# Patient Record
Sex: Female | Born: 1965
Health system: Southern US, Community
[De-identification: ages and names within clinical notes are randomized; demographics above are authoritative.]

## PROBLEM LIST (undated history)

## (undated) DIAGNOSIS — Z860100 Personal history of colon polyps, unspecified: Secondary | ICD-10-CM

## (undated) DIAGNOSIS — Z923 Personal history of irradiation: Secondary | ICD-10-CM

## (undated) DIAGNOSIS — Z9889 Other specified postprocedural states: Secondary | ICD-10-CM

## (undated) DIAGNOSIS — G894 Chronic pain syndrome: Secondary | ICD-10-CM

## (undated) DIAGNOSIS — K219 Gastro-esophageal reflux disease without esophagitis: Secondary | ICD-10-CM

## (undated) DIAGNOSIS — F329 Major depressive disorder, single episode, unspecified: Secondary | ICD-10-CM

## (undated) DIAGNOSIS — G43909 Migraine, unspecified, not intractable, without status migrainosus: Secondary | ICD-10-CM

## (undated) DIAGNOSIS — I427 Cardiomyopathy due to drug and external agent: Secondary | ICD-10-CM

## (undated) DIAGNOSIS — Z8719 Personal history of other diseases of the digestive system: Secondary | ICD-10-CM

## (undated) DIAGNOSIS — Z8701 Personal history of pneumonia (recurrent): Secondary | ICD-10-CM

## (undated) DIAGNOSIS — Z7901 Long term (current) use of anticoagulants: Secondary | ICD-10-CM

## (undated) DIAGNOSIS — F32A Depression, unspecified: Secondary | ICD-10-CM

## (undated) DIAGNOSIS — Z8042 Family history of malignant neoplasm of prostate: Secondary | ICD-10-CM

## (undated) DIAGNOSIS — Z86718 Personal history of other venous thrombosis and embolism: Secondary | ICD-10-CM

## (undated) DIAGNOSIS — R112 Nausea with vomiting, unspecified: Secondary | ICD-10-CM

## (undated) DIAGNOSIS — F419 Anxiety disorder, unspecified: Secondary | ICD-10-CM

## (undated) DIAGNOSIS — C50919 Malignant neoplasm of unspecified site of unspecified female breast: Secondary | ICD-10-CM

## (undated) HISTORY — PX: REDUCTION MAMMAPLASTY: SUR839

## (undated) HISTORY — DX: Family history of malignant neoplasm of prostate: Z80.42

## (undated) HISTORY — DX: Cardiomyopathy due to drug and external agent: I42.7

---

## 2000-05-11 HISTORY — PX: SHOULDER ARTHROSCOPY WITH ROTATOR CUFF REPAIR: SHX5685

## 2000-11-29 ENCOUNTER — Other Ambulatory Visit: Admission: RE | Admit: 2000-11-29 | Discharge: 2000-11-29 | Payer: Self-pay | Admitting: Obstetrics and Gynecology

## 2000-11-30 ENCOUNTER — Ambulatory Visit (HOSPITAL_COMMUNITY): Admission: RE | Admit: 2000-11-30 | Discharge: 2000-11-30 | Payer: Self-pay | Admitting: *Deleted

## 2000-11-30 ENCOUNTER — Encounter: Payer: Self-pay | Admitting: *Deleted

## 2001-01-28 ENCOUNTER — Ambulatory Visit (HOSPITAL_COMMUNITY): Admission: RE | Admit: 2001-01-28 | Discharge: 2001-01-28 | Payer: Self-pay | Admitting: Pulmonary Disease

## 2001-02-08 ENCOUNTER — Ambulatory Visit (HOSPITAL_COMMUNITY): Admission: RE | Admit: 2001-02-08 | Discharge: 2001-02-08 | Payer: Self-pay | Admitting: Pulmonary Disease

## 2001-02-17 ENCOUNTER — Ambulatory Visit (HOSPITAL_COMMUNITY): Admission: RE | Admit: 2001-02-17 | Discharge: 2001-02-17 | Payer: Self-pay | Admitting: Pulmonary Disease

## 2001-02-28 ENCOUNTER — Ambulatory Visit (HOSPITAL_COMMUNITY): Admission: RE | Admit: 2001-02-28 | Discharge: 2001-02-28 | Payer: Self-pay | Admitting: Pulmonary Disease

## 2001-05-11 HISTORY — PX: CERVICAL FUSION: SHX112

## 2002-03-17 ENCOUNTER — Emergency Department (HOSPITAL_COMMUNITY): Admission: EM | Admit: 2002-03-17 | Discharge: 2002-03-17 | Payer: Self-pay | Admitting: Emergency Medicine

## 2002-03-17 ENCOUNTER — Encounter: Payer: Self-pay | Admitting: Emergency Medicine

## 2002-11-08 ENCOUNTER — Ambulatory Visit (HOSPITAL_COMMUNITY): Admission: RE | Admit: 2002-11-08 | Discharge: 2002-11-08 | Payer: Self-pay | Admitting: Pulmonary Disease

## 2002-11-10 ENCOUNTER — Encounter (HOSPITAL_COMMUNITY): Admission: RE | Admit: 2002-11-10 | Discharge: 2002-12-10 | Payer: Self-pay | Admitting: Pulmonary Disease

## 2002-11-17 ENCOUNTER — Ambulatory Visit (HOSPITAL_COMMUNITY): Admission: RE | Admit: 2002-11-17 | Discharge: 2002-11-17 | Payer: Self-pay | Admitting: General Surgery

## 2002-11-17 HISTORY — PX: LAPAROSCOPIC CHOLECYSTECTOMY: SUR755

## 2003-02-08 ENCOUNTER — Ambulatory Visit (HOSPITAL_COMMUNITY): Admission: RE | Admit: 2003-02-08 | Discharge: 2003-02-08 | Payer: Self-pay | Admitting: Pulmonary Disease

## 2003-02-23 ENCOUNTER — Encounter (HOSPITAL_COMMUNITY): Admission: RE | Admit: 2003-02-23 | Discharge: 2003-03-25 | Payer: Self-pay | Admitting: Neurosurgery

## 2003-05-12 HISTORY — PX: KNEE ARTHROSCOPY: SUR90

## 2003-12-13 ENCOUNTER — Ambulatory Visit (HOSPITAL_COMMUNITY): Admission: RE | Admit: 2003-12-13 | Discharge: 2003-12-13 | Payer: Self-pay | Admitting: Pulmonary Disease

## 2004-11-17 ENCOUNTER — Ambulatory Visit (HOSPITAL_COMMUNITY): Admission: RE | Admit: 2004-11-17 | Discharge: 2004-11-17 | Payer: Self-pay | Admitting: Internal Medicine

## 2005-10-26 ENCOUNTER — Ambulatory Visit (HOSPITAL_COMMUNITY): Admission: RE | Admit: 2005-10-26 | Discharge: 2005-10-26 | Payer: Self-pay | Admitting: Pulmonary Disease

## 2005-12-21 ENCOUNTER — Ambulatory Visit (HOSPITAL_COMMUNITY): Admission: RE | Admit: 2005-12-21 | Discharge: 2005-12-21 | Payer: Self-pay | Admitting: Obstetrics and Gynecology

## 2006-05-11 HISTORY — PX: CATARACT EXTRACTION W/ INTRAOCULAR LENS  IMPLANT, BILATERAL: SHX1307

## 2006-06-22 ENCOUNTER — Ambulatory Visit: Payer: Self-pay | Admitting: Cardiology

## 2006-10-08 ENCOUNTER — Ambulatory Visit (HOSPITAL_COMMUNITY): Admission: RE | Admit: 2006-10-08 | Discharge: 2006-10-08 | Payer: Self-pay | Admitting: Pulmonary Disease

## 2006-10-25 ENCOUNTER — Ambulatory Visit (HOSPITAL_COMMUNITY): Admission: RE | Admit: 2006-10-25 | Discharge: 2006-10-25 | Payer: Self-pay | Admitting: Pulmonary Disease

## 2007-03-07 ENCOUNTER — Ambulatory Visit (HOSPITAL_COMMUNITY): Admission: RE | Admit: 2007-03-07 | Discharge: 2007-03-07 | Payer: Self-pay | Admitting: Obstetrics and Gynecology

## 2007-03-30 ENCOUNTER — Ambulatory Visit (HOSPITAL_COMMUNITY): Admission: RE | Admit: 2007-03-30 | Discharge: 2007-03-30 | Payer: Self-pay | Admitting: Pulmonary Disease

## 2007-04-13 ENCOUNTER — Encounter (INDEPENDENT_AMBULATORY_CARE_PROVIDER_SITE_OTHER): Payer: Self-pay | Admitting: General Surgery

## 2007-04-13 ENCOUNTER — Inpatient Hospital Stay (HOSPITAL_COMMUNITY): Admission: RE | Admit: 2007-04-13 | Discharge: 2007-04-18 | Payer: Self-pay | Admitting: General Surgery

## 2007-04-13 HISTORY — PX: OTHER SURGICAL HISTORY: SHX169

## 2007-07-19 ENCOUNTER — Ambulatory Visit: Payer: Self-pay | Admitting: *Deleted

## 2007-07-19 ENCOUNTER — Inpatient Hospital Stay (HOSPITAL_COMMUNITY): Admission: AD | Admit: 2007-07-19 | Discharge: 2007-07-21 | Payer: Self-pay | Admitting: *Deleted

## 2007-07-27 ENCOUNTER — Other Ambulatory Visit: Admission: RE | Admit: 2007-07-27 | Discharge: 2007-07-27 | Payer: Self-pay | Admitting: Obstetrics and Gynecology

## 2008-03-21 ENCOUNTER — Ambulatory Visit (HOSPITAL_COMMUNITY): Admission: RE | Admit: 2008-03-21 | Discharge: 2008-03-21 | Payer: Self-pay | Admitting: Obstetrics and Gynecology

## 2008-08-20 ENCOUNTER — Other Ambulatory Visit: Admission: RE | Admit: 2008-08-20 | Discharge: 2008-08-20 | Payer: Self-pay | Admitting: Obstetrics and Gynecology

## 2008-09-15 ENCOUNTER — Encounter: Admission: RE | Admit: 2008-09-15 | Discharge: 2008-09-15 | Payer: Self-pay | Admitting: Specialist

## 2009-03-22 ENCOUNTER — Ambulatory Visit (HOSPITAL_COMMUNITY): Admission: RE | Admit: 2009-03-22 | Discharge: 2009-03-22 | Payer: Self-pay | Admitting: Obstetrics and Gynecology

## 2009-08-14 ENCOUNTER — Ambulatory Visit (HOSPITAL_COMMUNITY): Admission: RE | Admit: 2009-08-14 | Discharge: 2009-08-14 | Payer: Self-pay | Admitting: Pulmonary Disease

## 2010-03-24 ENCOUNTER — Ambulatory Visit (HOSPITAL_COMMUNITY): Admission: RE | Admit: 2010-03-24 | Discharge: 2010-03-24 | Payer: Self-pay | Admitting: Gynecology

## 2010-04-11 ENCOUNTER — Ambulatory Visit (HOSPITAL_COMMUNITY)
Admission: RE | Admit: 2010-04-11 | Discharge: 2010-04-11 | Payer: Self-pay | Source: Home / Self Care | Admitting: Obstetrics and Gynecology

## 2010-04-11 HISTORY — PX: OTHER SURGICAL HISTORY: SHX169

## 2010-05-31 ENCOUNTER — Encounter: Payer: Self-pay | Admitting: Obstetrics and Gynecology

## 2010-06-01 ENCOUNTER — Encounter: Payer: Self-pay | Admitting: Obstetrics and Gynecology

## 2010-07-22 LAB — COMPREHENSIVE METABOLIC PANEL
ALT: 36 U/L — ABNORMAL HIGH (ref 0–35)
Albumin: 3.7 g/dL (ref 3.5–5.2)
Alkaline Phosphatase: 67 U/L (ref 39–117)
Creatinine, Ser: 0.71 mg/dL (ref 0.4–1.2)
GFR calc non Af Amer: >60 mL/min (ref >60)
Glucose, Bld: 92 mg/dL (ref 70–99)
Sodium: 135 mEq/L (ref 135–145)
Total Bilirubin: 0.2 mg/dL — ABNORMAL LOW (ref 0.3–1.2)

## 2010-07-22 LAB — CBC
HCT: 36.5 % (ref 36.0–46.0)
Hemoglobin: 12.3 g/dL (ref 12.0–15.0)
MCH: 30.8 pg (ref 26.0–34.0)
MCHC: 33.7 g/dL (ref 30.0–36.0)
MCV: 91.5 fL (ref 78.0–100.0)
RBC: 3.99 MIL/uL (ref 3.87–5.11)

## 2010-09-23 NOTE — Discharge Summary (Signed)
NAMEAMRITA, [name]                ACCOUNT NO.:  [id number]   MEDICAL RECORD NO.:  [id number]          PATIENT TYPE:  INP   LOCATION:  A313                          FACILITY:  [facility]   PHYSICIAN:  Dalia Heading, M.D.  DATE OF BIRTH:  1965-10-05   DATE OF ADMISSION:  04/13/2007  DATE OF DISCHARGE:  12/08/2008LH                               DISCHARGE SUMMARY   HOSPITAL COURSE SUMMARY:  The patient is a 45 year old white female who  presented to the operating room for a diagnostic laparoscopy.  This was  due to the fact that she had a 3-week history of nonspecific right lower  quadrant abdominal pain of unknown etiology.  At the time of surgery,  she was found to have a soft tissue mass in the small bowel.  She  ultimately underwent a laparoscopic partial small-bowel resection as  well as appendectomy.  She tolerated both procedures well.  Postoperative course was, for the most part, unremarkable.  Her diet was  advanced without difficulty once her bowel function returned.   Final pathology revealed a lipoma of the small bowel.  It is suspected  that she was having intermittent episodes of intussusception due to the  lipomas mass in the small bowel.  She is being discharged home on  April 18, 2007 in good improving condition.   DISCHARGE INSTRUCTIONS:  The patient is to follow up with Dr. Franky Macho on April 21, 2007.   DISCHARGE MEDICATIONS INCLUDE:  1. Topamax 2 mg p.o. nightly.  2. Relpax 40 mg p.o. p.r.n.  3. Birth control pills.  4. Hydrocodone p.r.n. pain.   PRINCIPAL DIAGNOSIS:  1. Lipoma, small bowel.  2. History of migraine headaches.   PRINCIPAL PROCEDURE:  On April 13, 2007  1. Laparoscopic partial small-bowel resection.  2. Laparoscopic appendectomy.      Dalia Heading, M.D.  Electronically Signed     MAJ/MEDQ  D:  04/18/2007  T:  04/18/2007  Job:  [id number]   cc:   Ramon Dredge L. Juanetta Gosling, M.D.  Fax: [phone]   Lazaro Arms, M.D.  Fax:  [phone]

## 2010-09-23 NOTE — Op Note (Signed)
[name], [name]                ACCOUNT NO.:  [id number]   MEDICAL RECORD NO.:  [id number]          PATIENT TYPE:  INP   LOCATION:  A313                          FACILITY:  [facility]   PHYSICIAN:  Dalia Heading, M.D.  DATE OF BIRTH:  13-Mar-1966   DATE OF PROCEDURE:  04/13/2007  DATE OF DISCHARGE:                               OPERATIVE REPORT   PREOPERATIVE DIAGNOSIS:  Abdominal pain of unknown etiology.   POSTOPERATIVE DIAGNOSIS:  Abdominal pain of unknown etiology, small  bowel neoplasm.   PROCEDURE:  Diagnostic laparoscopy, laparoscopic partial small-bowel  resection, laparoscopic appendectomy.   SURGEON:  Dr. Franky Macho.   ASSISTANT:  Dr. Tilford Pillar.   ANESTHESIA:  General endotracheal.   INDICATIONS:  The patient is a 45 year old white female who presents  with a 3-week history of worsening lower abdominal pain of unknown  etiology.  She has had multiple CAT scans of the pelvis which had been  unremarkable except for small ovarian cysts.  The patient now comes to  the operating for diagnostic laparoscopy.  Risks and benefits of the  procedure including bleeding, infection, pain, and the possibility of an  open procedure were fully explained to the patient, who gave informed  consent.  The patient was told she was going to have an appendectomy  during the procedure.   PROCEDURE NOTE:  The patient was placed in the supine position.  After  induction of general endotracheal anesthesia, the abdomen was prepped  and draped in the usual sterile technique with Betadine.  Surgical site  confirmation was performed.   A supraumbilical incision was made down to the fascia.  A Veress needle  was introduced into the abdominal cavity and confirmation of placement  was done using the saline drop test.  The abdomen was then insufflated  to 16 mmHg pressure.  An 11 mm trocar was introduced into the abdominal  cavity under direct visualization without difficulty.  The patient was  placed in deeper Trendelenburg position.  Additional 12-mm port trocar  was placed in the suprapubic region and a 5-mm trocar was placed left  lower quadrant region.  The uterus and ovaries were inspected and noted  to normal limits.  The appendix was visualized and noted to be within  normal limits.  The terminal ileum and proximal small bowel was then  inspected.  At approximately the area you would find a Meckel's  diverticulum, a soft solid mass was noted to be in this region.  It  appeared to be partially obstructing the small bowel.  It was submucosal  in nature and yellow in appearance.  It was consistent with either a  large lipoma of the wall of the small bowel at the area of the  diverticulum or a neoplastic process.  It was elected to proceed with  resection of this area small of bowel as well as a appendectomy.  The  appendiceal mesentery was divided using the harmonic scalpel.  A  vascular Endo-GIA was placed across the base of the appendix and fired.  The appendix was removed using EndoCatch bag without difficulty.  Next,  a Gel Port was then inserted through an extended Pfannenstiel incision.  This allowed manipulation of the small intestine as well as further  exploration.  Liver was within normal limits.  No abnormal lesions were  noted.  The gallbladder was absent.  The rest of the proximal small  bowel was within normal limits.  No abnormal lesions were noted.  There  were no peritoneal implants present.  The sigmoid colon was within  normal limits.  No abnormal lesions were noted.  The small bowel was  then exteriorized through the Pfannenstiel incision.  The GIA stapler  was placed proximally and distally across the small bowel around the  neoplasm without difficulty.  The mesentery was divided using the  LigaSure.  The specimen was then removed and sent to pathology for  further examination.  A side-to-side enteroenterotomy was then performed  using a GIA 60 stapler.   The enterotomy was closed using a TA-60  stapler.  The staple line was bolstered using 3-0 silk Lambert sutures.  The mesenteric defect was closed using a 2-0 chromic gut running suture.  The bowel was then returned into the abdominal cavity and the abdomen  insufflated.  No abnormal bleeding was noted at end of the procedure.  The anastomosis was inspected and noted to be patent.  All fluid and air  were then evacuated from the abdominal cavity prior to removal of the  trocars and the Gel Port.   All wounds irrigated with normal saline.  All wounds were injected with  0.5 cm Sensorcaine.  The supraumbilical fascia was reapproximated using  a 0 Vicryl interrupted suture.  This Pfannenstiel incision was closed in  layers.  The peritoneal layer was closed using a 0 chromic gut running  suture.  The fascia was reapproximated using 0 Vicryl interrupted  sutures.  The subcutaneous layer was closed using 2-0 Vicryl interrupted  sutures.  All skin incisions were then closed using staples.  Betadine  ointment and dry sterile dressings were applied.   All tape and needle counts correct at the end of the procedure.  The  patient was extubated in the operating room and went back to recovery  room awake in stable condition.   COMPLICATIONS:  None.   SPECIMEN:  Appendix, the partial small-bowel.   BLOOD LOSS:  Minimal.      Dalia Heading, M.D.  Electronically Signed     MAJ/MEDQ  D:  04/13/2007  T:  04/13/2007  Job:  [id number]   cc:   Lazaro Arms, M.D.  Fax: [phone]   Oneal Deputy. Juanetta Gosling, M.D.  Fax: [phone]

## 2010-09-23 NOTE — H&P (Signed)
[name], [name]                ACCOUNT NO.:  [id number]   MEDICAL RECORD NO.:  [id number]          PATIENT TYPE:  INP   LOCATION:  A313                          FACILITY:  [facility]   PHYSICIAN:  Dalia Heading, M.D.  DATE OF BIRTH:  07-16-1965   DATE OF ADMISSION:  04/13/2007  DATE OF DISCHARGE:  LH                              HISTORY & PHYSICAL   CHIEF COMPLAINT:  Abdominal pain of unknown etiology.   HISTORY OF PRESENT ILLNESS:  The patient is a 45 year old white female  who was referred for evaluation and treatment of right lower quadrant  abdominal pain.  It has been present for 3 weeks and is not going away.  She has had CT scans x2 which have been remarkable only for ovarian  cysts.  Her appetite is decreased.  No fever or chills have been noted.   PAST MEDICAL HISTORY:  Migraine headaches.   PAST SURGICAL HISTORY:  1. Cholecystectomy.  2. Neck fusion.  3. Right shoulder surgery.  4. Right knee surgery.   CURRENT MEDICATIONS:  1. Topamax.  2. Relpax.  3. Birth control pills.   ALLERGIES:  No known drug allergies.   REVIEW OF SYSTEMS:  The patient denies drinking or smoking.  She denies  any other cardiopulmonary difficulties or bleeding disorders.   PHYSICAL EXAMINATION:  GENERAL:  The patient is a well-developed, well-  nourished, white female in no acute distress.  HEENT:  No scleral icterus.  LUNGS:  Clear to auscultation with equal breath sounds bilaterally.  HEART:  Regular rate and rhythm without S3, S4 or murmurs.  ABDOMEN:  Soft and nondistended.  She is tender in the right lower  quadrant to palpation.  No hepatosplenomegaly, masses or hernias are  identified.   IMPRESSION:  Abdominal pain of unknown etiology.   PLAN:  The patient is scheduled for a diagnostic laparoscopy with  appendectomy on April 13, 2007.  The risks and benefits of the  procedure including bleeding, infection, recurrence of the pain and the  possibility of an open procedure  were fully explained to the patient who  gave informed consent.      Dalia Heading, M.D.  Electronically Signed     MAJ/MEDQ  D:  04/12/2007  T:  04/13/2007  Job:  [id number]   cc:   Ramon Dredge L. Juanetta Gosling, M.D.  Fax: [phone]   Lazaro Arms, M.D.  Fax: [phone]
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
## 2010-09-26 NOTE — Op Note (Signed)
[name], [name]                          ACCOUNT NO.:  [id number]   MEDICAL RECORD NO.:  [id number]                   PATIENT TYPE:  AMB   LOCATION:  DAY                                  FACILITY:  [facility]   PHYSICIAN:  Dalia Heading, M.D.               DATE OF BIRTH:  Jul 13, 1965   DATE OF PROCEDURE:  11/17/2002  DATE OF DISCHARGE:                                 OPERATIVE REPORT   PREOPERATIVE DIAGNOSIS:  Chronic cholecystitis.   POSTOPERATIVE DIAGNOSIS:  Chronic cholecystitis.   PROCEDURE:  Laparoscopic cholecystectomy   SURGEON:  Dalia Heading, M.D.   ANESTHESIA:  General endotracheal   INDICATIONS:  The patient is a 45 year old white female who presents with  biliary colic secondary to chronic cholecystitis.  The risks and benefits of  the procedure including bleeding, infection, hepatobiliary injury, and the  possibility of an open procedure were fully explained to the patient, who  gave informed consent.   DESCRIPTION OF PROCEDURE:  The patient was placed in the supine position.  After induction of general endotracheal anesthesia, the abdomen was prepped  and draped using the usual sterile technique with Betadine.  Surgical site  confirmation was performed.   A supraumbilical incision was made down to the fascia.  A Veress needle was  introduced into the abdominal cavity and confirmation of placement was done  using the saline drop test.  The abdomen was then insufflated to 16 mmHg  pressure.  An 11-mm trocar was introduced into the abdominal cavity under  direct visualization without difficulty.  The patient was placed in reverse  Trendelenburg position and an additional 11-mm trocar was placed in the  epigastric region and 5-mm trocars were placed in the right upper quadrant  and right flank regions. The liver was inspected and noted to be within  normal limits.  The gallbladder was retracted superiorly and laterally.  The  dissection was begun around the  infundibulum of the gallbladder.  The cystic  duct was first identified.  Its junction to the infundibulum fully  identified.  Endoclips were placed proximally and distally on the cystic  duct; and the cystic duct was divided.  This was likewise done on the cystic  artery.   The gallbladder was then freed away from the gallbladder fossa using Bovie  electrocautery.  The gallbladder was delivered through the epigastric trocar  site without difficulty.  Of note, was the fact that several Fitz-Hugh-  Curtis adhesions were noted between the liver and the anterior abdominal  wall.  These were lysed sharply without difficulty.  The gallbladder fossa  was inspected and no abnormal bleeding or bile leakage was noted.  Surgicel  was placed in the gallbladder fossa,. All fluid and air were then evacuated  from the abdominal cavity prior to removal of the trocars.   All wounds were irrigated with normal saline.  All wounds were injected with  0.5% Sensorcaine.  The supraumbilical fascia was reapproximated using an #0  Vicryl interrupted suture. All skin incisions were closed using staples.  Betadine ointment and dry sterile dressings were applied.   All tape and needle counts correct at the end of the procedure.  The patient  was extubated in the operating room and went back to recovery room in awake  and stable condition.   COMPLICATIONS:  None.   SPECIMEN:  Gallbladder.   BLOOD LOSS:  Minimal.                                               Dalia Heading, M.D.    MAJ/MEDQ  D:  11/17/2002  T:  11/18/2002  Job:  [id number]   cc:   Ramon Dredge L. Juanetta Gosling, M.D.  [address]  [place]  [place] [address]  Fax: [phone]

## 2010-09-26 NOTE — Consult Note (Signed)
NAMEMarland Kitchen  Allison, [name]                          ACCOUNT NO.:  [id number]   MEDICAL RECORD NO.:  [id number]                   PATIENT TYPE:  EMS   LOCATION:  MAJO                                 FACILITY:  [facility]   PHYSICIAN:  Lesleigh Noe, M.D.            DATE OF BIRTH:  07/15/1965   DATE OF CONSULTATION:  03/17/2002  DATE OF DISCHARGE:  03/17/2002                                   CONSULTATION   REASON FOR CONSULTATION:  The patient is a 45 year old and has no prior  history of cardiovascular disease.  She was referred to the emergency room  by Dr. Juanetta [name] this morning after she called with complaints of pressure in  her chest.  In the emergency room here at [facility] she gives a one week  history of near continuous substernal tightness that is not aggravated by  physical activity, change in position, eating, or other activities.  This  morning when she was combing her hair with the background of chest  tightness, there was some numbness and weakness in the left arm, prompting  her to become concerned that this might be related to her heart.  She has  had some exertional dyspnea.  This has been present for several weeks, and  she feels may be related to physical inactivity.  She denies palpitations.  She denies orthopnea, PND, and has not had syncope.   PAST MEDICAL HISTORY:  1. History of right rotator cuff surgery in 5/03.  2. Tonsillectomy.  3. History of cluster headaches.   ALLERGIES:  No known drug allergies.   MEDICATIONS:  1. Topamax 25 mg A.D.  2. Birth control pills one q.d.  3. Vicodin p.r.n. for shoulder discomfort.   HABITS:  Does not smoke.  She drinks alcohol socially, but rarely.   FAMILY HISTORY:  Father has a history of cerebral aneurysm repair.  He is 47  years of age and doing well at this time.  Mother is 36, alive and well.  A  _____-year-old sister alive and well.   REVIEW OF SYMPTOMS:  She denies stress and depression.  She has some  exertional dyspnea.  She had previously exercised, but has not done this  recently.  She feels that she is deconditioned.   PHYSICAL EXAMINATION:  GENERAL:  The patient is having continuous sensation  of a fullness in her chest.  It makes her feel that she wants to take a deep  breath, and also that if she could belch she might feel better.  SKIN:  Warm and dry.  Clear, no cyanosis.  VITAL SIGNS:  Heart rate is 80, blood pressure is 140/60, respirations are  16 and unlabored.  HEENT:  No xanthalasthma, no jaundice.  Extraocular movements are full.  NECK:  No JVD, no carotid bruits are heard.  The thyroid is not palpable.  There is no adenopathy.  LUNGS:  Clear to auscultation and  percussion.  CARDIAC:  No click, no rub, no murmur, no gallop.  PMI is not palpable  because of breasts enlargement.  ABDOMEN:  Soft.  Live and spleen not palpable.  Bowel sounds are normal.  EXTREMITIES:  No edema.  Pulses are 2+ and symmetric in upper and lower  extremities.  NEUROLOGIC:  The patient is awake, alert and oriented x3.   LABORATORY DATA:  EKG reveals normal sinus rhythm and basically a normal  tracing.  This EKG was done several hours after the patient began having  chest discomfort today.  The discomfort today started around 7 a.m.  This  EKG was done at 10:15.  The CK-MB is 52/1.5, troponin-I is 0.02.  BUN and  creatinine are normal.  Hemoglobin is normal at 15.  Chest x-ray reveals  normal heart size, no acute abnormality noted.   ASSESSMENT:  Continuous chest discomfort, likely representing  gastroesophageal reflux.  This could be an atypical presentation for  ischemic heart disease, but would be highly unusual to have near continuous  discomfort for a week with a normal EKG, normal enzymes, and no exertional  component.  There are no significant risk factors in this 45 year old  female.   PLAN:  1. Nexium 40 mg one hour before meals.  2. Will have an outpatient exercise treadmill test  to rule out atypical     presentation of ischemic heart disease.                                                 Lesleigh Noe, M.D.    HWS/MEDQ  D:  03/17/2002  T:  03/19/2002  Job:  [id number]   cc:   Ramon Dredge L. [name], M.D.  [address]  [place]  [place] [address]  Fax: [phone]

## 2010-09-26 NOTE — H&P (Signed)
   [name], [name]                          ACCOUNT NO.:  [id number]   MEDICAL RECORD NO.:  [id number]                   PATIENT TYPE:  AMB   LOCATION:  DAY                                  FACILITY:  [facility]   PHYSICIAN:  Dalia Heading, M.D.               DATE OF BIRTH:  03-13-1966   DATE OF ADMISSION:  11/17/2002  DATE OF DISCHARGE:                                HISTORY & PHYSICAL   CHIEF COMPLAINT:  Chronic cholecystectomy.   HISTORY OF PRESENT ILLNESS:  The patient is a 45 year old white female who  is referred for evaluation and treatment of biliary colic secondary to  chronic cholecystitis.  She has been having right upper quadrant abdominal  pain with radiation to the right flank and back, nausea, postprandial  symptoms, fatty food intolerance, indigestion, and bloating for over a year.  No fever, chills, or jaundice have been noted.  The symptoms seem to be  worsening.   PAST MEDICAL HISTORY:  Includes reflux.   PAST SURGICAL HISTORY:  Right rotator cuff repair.   CURRENT MEDICATIONS:  Birth control pills, Wellbutrin, Topamax.   ALLERGIES:  No known drug allergies.   REVIEW OF SYSTEMS:  Noncontributory.   PHYSICAL EXAMINATION:  GENERAL:  The patient is a well-developed, well-  nourished white female in no acute distress.  VITAL SIGNS:  She is afebrile and vital signs are stable.  HEENT:  Reveals no scleral icterus.  LUNGS:  Clear to auscultation with equal breath sounds bilaterally.  HEART:  Reveals a regular rate and rhythm without S3, S4, or murmurs.  ABDOMEN:  Soft and nondistended.  She is tender in the right upper quadrant  to palpation.  No hepatosplenomegaly, masses, or hernias are identified.   Ultrasound of the gallbladder is negative.  Hepatobiliary scan reveals  chronic cholecystitis with a low gallbladder ejection fraction.   IMPRESSION:  Chronic cholecystitis.    PLAN:  The patient is scheduled for laparoscopic cholecystectomy on November 17, 2002.   The risks and benefits of the procedure including bleeding,  infection, hepatobiliary injury, the possibility of an open procedure were  fully explained to the patient, who gave informed consent.                                               Dalia Heading, M.D.    MAJ/MEDQ  D:  11/16/2002  T:  11/16/2002  Job:  [id number]   cc:   Ramon Dredge L. Juanetta Gosling, M.D.  [address]  [place]  [place] [address]  Fax: [phone]
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
## 2010-10-07 ENCOUNTER — Other Ambulatory Visit: Payer: Self-pay | Admitting: Obstetrics and Gynecology

## 2010-10-07 ENCOUNTER — Encounter (HOSPITAL_COMMUNITY): Payer: 59

## 2010-10-07 LAB — SURGICAL PCR SCREEN
MRSA, PCR: NEGATIVE
Staphylococcus aureus: NEGATIVE

## 2010-10-07 LAB — CBC
HCT: 41.1 % (ref 36.0–46.0)
MCHC: 32.1 g/dL (ref 30.0–36.0)
MCV: 93.6 fL (ref 78.0–100.0)

## 2010-10-09 NOTE — H&P (Addendum)
  [name], [name]                ACCOUNT NO.:  [id number]  MEDICAL RECORD NO.:  [id number]  LOCATION:  SDC                           FACILITY:  [facility]  PHYSICIAN:  Guy Sandifer. Henderson Cloud, M.D. DATE OF BIRTH:  16-Jun-1965  DATE OF ADMISSION:  10/07/2010 DATE OF DISCHARGE:                             HISTORY & PHYSICAL   CHIEF COMPLAINT:  Heavy painful menses.  HISTORY OF PRESENT ILLNESS:  This patient is a 45 year old married white female G2, P2, husband status post vasectomy who is status post hysteroscopy with resection of benign endometrial polyps and NovaSure endometrial ablation in December 2011.  Since then she is continued to have sometimes heavy painful menses.  After discussion of options, she is being admitted for laparoscopically-assisted vaginal hysterectomy.  PAST MEDICAL HISTORY: 1. Headache. 2. Insomnia. 3. Depression.  PAST SURGICAL HISTORY:  Right shoulder rotator cuff repair, neck fusion, cholecystectomy, right knee surgery, appendectomy and removal of benign tumor from intestine 2008 and hysteroscopy D&C, NovaSure endometrial ablation as above.  MEDICATIONS: 1. Zoloft 200 mg daily. 2. Topamax 150 mg daily. 3. Ambien 10 mg p.r.n. 4. Relpax 40 mg p.r.n.  ALLERGIES:  No known drug allergies.  REVIEW OF SYSTEMS:  NEURO:  Headache as above.  CARDIAC:  Denies chest pain.  PULMONARY:  Denies shortness of breath.  FAMILY HISTORY:  Positive for heart disease and diabetes.  PHYSICAL EXAMINATION:  VITAL SIGNS:  Height 5 feet 8 inches, weight 228 pounds, blood pressure 110/76. LUNGS:  Clear to auscultation. HEART:  Regular rate and rhythm. ABDOMEN:  Soft, nontender without masses. PELVIC:  Vagina and cervix without lesion.  Uterus is 8 weeks in size, mobile, nontender.  Adnexa nontender without masses.  EXTREMITIES: Grossly within normal limits. NEUROLOGIC:  Grossly within normal limits.  ASSESSMENT:  Dysmenorrhea, menorrhagia.  PLAN:  Laparoscopically-assisted  vaginal hysterectomy.     Guy Sandifer Henderson Cloud, M.D.     JET/MEDQ  D:  10/08/2010  T:  10/09/2010  Job:  [id number]  Electronically Signed by Harold Hedge M.D. on 11/07/2010 09:12:16 AM

## 2010-10-13 ENCOUNTER — Ambulatory Visit (HOSPITAL_COMMUNITY)
Admission: RE | Admit: 2010-10-13 | Discharge: 2010-10-14 | Disposition: A | Discharge status: Home / Self Care | Payer: 59 | Source: Ambulatory Visit | Attending: Obstetrics and Gynecology | Admitting: Obstetrics and Gynecology

## 2010-10-13 ENCOUNTER — Other Ambulatory Visit: Payer: Self-pay | Admitting: Obstetrics and Gynecology

## 2010-10-13 DIAGNOSIS — N838 Other noninflammatory disorders of ovary, fallopian tube and broad ligament: Secondary | ICD-10-CM | POA: Insufficient documentation

## 2010-10-13 DIAGNOSIS — N83209 Unspecified ovarian cyst, unspecified side: Secondary | ICD-10-CM | POA: Insufficient documentation

## 2010-10-13 DIAGNOSIS — Z01812 Encounter for preprocedural laboratory examination: Secondary | ICD-10-CM | POA: Insufficient documentation

## 2010-10-13 DIAGNOSIS — N920 Excessive and frequent menstruation with regular cycle: Secondary | ICD-10-CM | POA: Insufficient documentation

## 2010-10-13 DIAGNOSIS — N946 Dysmenorrhea, unspecified: Secondary | ICD-10-CM | POA: Insufficient documentation

## 2010-10-13 DIAGNOSIS — Z01818 Encounter for other preprocedural examination: Secondary | ICD-10-CM | POA: Insufficient documentation

## 2010-10-13 HISTORY — PX: LAPAROSCOPIC ASSISTED VAGINAL HYSTERECTOMY: SHX5398

## 2010-10-14 LAB — CBC
HCT: 35.5 % — ABNORMAL LOW (ref 36.0–46.0)
Hemoglobin: 11.1 g/dL — ABNORMAL LOW (ref 12.0–15.0)
MCH: 30.4 pg (ref 26.0–34.0)
Platelets: 206 10*3/uL (ref 150–400)
RDW: 15.9 % — ABNORMAL HIGH (ref 11.5–15.5)

## 2010-11-07 NOTE — Op Note (Signed)
NAMEANNISTON, [name]                ACCOUNT NO.:  [id number]  MEDICAL RECORD NO.:  [id number]  LOCATION:  9306                          FACILITY:  [facility]  PHYSICIAN:  Allison Sandifer. Henderson [name], M.D. DATE OF BIRTH:  1965-10-13  DATE OF PROCEDURE:  10/13/2010 DATE OF DISCHARGE:                              OPERATIVE REPORT   PREOPERATIVE DIAGNOSES: 1. Dysmenorrhea. 2. Menorrhagia.  POSTOPERATIVE DIAGNOSES: 1. Dysmenorrhea. 2. Menorrhagia. 3. Left paratubal cyst. 4. Right ovarian cyst.  PROCEDURES: 1. Laparoscopically-assisted vaginal hysterectomy. 2. Biopsy of left fallopian tube. 3. Aspiration of right ovarian cyst.  SURGEON:  Allison Sandifer. Henderson Cloud, MD  ASSISTANT:  Freddy Finner, MD  ANESTHESIA:  General with endotracheal intubation.  SPECIMENS: 1. Uterus. 2. Biopsy of the left fallopian tube. 3. Aspirate of right ovarian cyst, all to Pathology.  ESTIMATED BLOOD LOSS:  150 mL.  INDICATIONS AND CONSENT:  This patient is a 45 year old married white female G2, P2, husband is status post vasectomy with heavy and painful bleeding.  Details are dictated in the history and physical. Laparoscopically-assisted vaginal hysterectomy, removal of tube and ovary if distinctly abnormal had been discussed preoperatively. Potential risks and complications have been discussed preoperatively including but not limited to infection, organ damage, bleeding requiring transfusion of blood products with HIV and hepatitis acquisition, DVT, PE, pneumonia, fistula formation, laparotomy, pelvic pain, and painful intercourse.  All questions have been answered and consent was signed on the chart.  FINDINGS:  There are several adhesions of the dome on the right side of the liver to the diaphragm.  There are band of omental adhesions to the anterior abdominal wall in the center.  The uterus is 8 weeks in size. Anterior and posterior cul-de-sacs are clean.  The right ovary has a 2-3 cm smooth translucent cyst  and left ovary appears normal and the left fallopian tube has a 2-cm cyst arising from its distal portion.  DESCRIPTION OF PROCEDURE:  The patient was taken to the operating room where she was identified, placed in the dorsal supine position and general anesthesia was induced via endotracheal intubation.  Time-out was undertaken.  She was prepped abdominally and vaginally.  Bladder straight catheterized.  Hulka tenaculum was placed in the uterus as a manipulator and she was draped in a sterile fashion.  The infraumbilical and suprapubic areas were injected in the midline with approximately 5 mL of 0.5% plain Marcaine.  A small infraumbilical incision was made. Disposable Veress needle was placed on the first attempt without difficulty.  Good syringe and drop test were noted.  A 2 liters of gas were insufflated under low pressure with good tympany in the right upper quadrant.  Veress needle was removed.  A 10/11 Xcel bladeless disposable trocar sleeve was placed using direct visualization with the diagnostic laparoscope.  After placement, the operative scope was used.  After careful inspection reveals only the omentum to be involved in the adhesions, the EnSeal bipolar cautery cutting instrument was used to take down the middle adhesions to the anterior abdominal wall to a point of insertion.  Good hemostasis was noted.  A small suprapubic incision was made in the midline and a 5-mm Xcel bladeless disposable  trocar sleeve was placed under direct visualization without difficulty.  The above findings were noted.  The left paratubal cyst was removed with the EnSeal product and good hemostasis was maintained.  The right ovarian cyst was aspirated for several drops of clear fluid which was sent for cytology.  Hemostasis was maintained with the EnSeal product.  The EnSeal was then used to take down the proximal ligaments down to the level of the vesicouterine peritoneum bilaterally.  The  vesicouterine peritoneum was then taken down cephalad laterally with the same instrument.  After noting hemostasis, suprapubic trocar sleeve was removed.  Instruments were removed.  Attention was turned to the vagina. Posterior cul-de-sac was sharply entered and the cervix was circumscribed with the unipolar cautery.  Mucosa was advanced sharply and bluntly.  Then using the LigaSure bipolar cautery instrument, the uterosacral ligaments were taken down followed by the bladder pillars, cardinal ligaments, and uterine vessels bilaterally.  Fundus was delivered posteriorly.  Pedicles were taken down.  The specimens delivered.  All suture will be 0 Monocryl unless otherwise designated. Uterosacral ligaments were plicated the vaginal cuff bilaterally.  A 3 and 9 o'clock suture was also used to obtain complete hemostasis. Uterosacral ligaments were then plicated in the midline with a third suture.  Cuff was closed with figure-of-eights.  Foley catheter was placed in the bladder and clear urine was noted.  Pneumoperitoneum was then reintroduced.  The suprapubic trocar sleeve was reintroduced under direct visualization and irrigation was carried out.  Minor oozing at peritoneal edges was controlled with bipolar cautery.  The remaining 25 mL of 1/2% plain Marcaine was placed in the peritoneal cavity. Suprapubic trocar sleeve was removed.  Pneumoperitoneum was reduced. The umbilical trocar sleeve was removed.  Both incisions were closed with Dermabond.  All counts were correct.  The patient was awakened, taken to the recovery room in stable condition.     [name], M.D.     JET/MEDQ  D:  10/13/2010  T:  10/13/2010  Job:  [id number]  Electronically Signed by Harold Hedge M.D. on 11/07/2010 09:12:09 AM

## 2010-11-07 NOTE — Discharge Summary (Signed)
  [name], [name]                ACCOUNT NO.:  [id number]  MEDICAL RECORD NO.:  [id number]  LOCATION:  9306                          FACILITY:  [facility]  PHYSICIAN:  Guy Sandifer. Henderson Cloud, M.D. DATE OF BIRTH:  January 16, 1966  DATE OF ADMISSION:  10/13/2010 DATE OF DISCHARGE:  10/14/2010                              DISCHARGE SUMMARY   ADMITTING DIAGNOSES:  Dysmenorrhea and menorrhagia.  DISCHARGE DIAGNOSES:  Dysmenorrhea, menorrhagia, left paratubal cyst and right ovarian cyst.  PROCEDURE:  On October 13, 2010, is laparoscopically-assisted vaginal hysterectomy, biopsy of left fallopian tube and aspiration of right ovarian cyst.  REASON FOR ADMISSION:  This patient is a 45 year old married white female G2, P2, husband status post vasectomy with increasingly symptomatic heavy painful menses.  Details are dictated in the history and physical.  She is admitted for surgical management.  HOSPITAL COURSE:  The patient was admitted to the hospital, undergoes the above procedure.  Estimated blood loss was 150 mL.  On the evening of surgery, she had good pain relief, tolerating regular diet and ambulating.  Vital signs are stable.  She is afebrile with clear urine output.  On the day of discharge, she is tolerating regular diet, ambulating, passing flatus and voiding.  Vital signs are stable.  She remains afebrile.  Hemoglobin is 11.1 and pathology is pending.  CONDITION ON DISCHARGE:  Good.  DIET:  Regular as tolerated.  ACTIVITY:  No lifting, no operation of automobiles, no vaginal entry. She is to call the office for problems including not limited to temperature 101 degrees, persistent nausea, vomiting, heavy bleeding or increasing pain.  Medications; Percocet 5/325 mg #30 one to two p.o. q.6 h. p.r.n., ibuprofen 600 mg q.6 h. p.r.n.  Follow up is in the office in 2 weeks.     Guy Sandifer Henderson Cloud, M.D.     JET/MEDQ  D:  10/14/2010  T:  10/14/2010  Job:  [id number]  Electronically Signed by  Harold Hedge M.D. on 11/07/2010 09:12:12 AM

## 2011-02-02 LAB — URINALYSIS, ROUTINE W REFLEX MICROSCOPIC: Nitrite: NEGATIVE

## 2011-02-02 LAB — CBC
HCT: 41
Hemoglobin: 14.1
MCHC: 34.3
MCV: 92.5
RBC: 4.44

## 2011-02-02 LAB — BENZODIAZEPINE, QUANTITATIVE, URINE
Alprazolam (GC/LC/MS), ur confirm: 120 ng/mL
Flurazepam GC/MS Conf: NEGATIVE

## 2011-02-02 LAB — COMPREHENSIVE METABOLIC PANEL
ALT: 22
CO2: 25
Chloride: 109
GFR calc non Af Amer: 58 — ABNORMAL LOW
Glucose, Bld: 117 — ABNORMAL HIGH
Potassium: 3.9
Sodium: 139
Total Bilirubin: 0.5

## 2011-02-02 LAB — URINE MICROSCOPIC-ADD ON

## 2011-02-02 LAB — URINE DRUGS OF ABUSE SCREEN W ALC, ROUTINE (REF LAB)
Cocaine Metabolites: NEGATIVE
Ethyl Alcohol: <5
Opiate Screen, Urine: NEGATIVE
Phencyclidine (PCP): NEGATIVE
Propoxyphene: NEGATIVE

## 2011-02-16 LAB — BASIC METABOLIC PANEL
BUN: 12
CO2: 23
Calcium: 8.9
Chloride: 111
Creatinine, Ser: 0.74
GFR calc Af Amer: >60
Glucose, Bld: 101 — ABNORMAL HIGH
Sodium: 141

## 2011-02-16 LAB — CBC
Hemoglobin: 12.5
Hemoglobin: 14.1
MCHC: 33.3
MCHC: 33.5
MCHC: 33.6
MCV: 94.5
RBC: 3.95
RBC: 4.49
RDW: 13.2

## 2011-02-16 LAB — DIFFERENTIAL
Basophils Absolute: 0
Basophils Relative: 0
Basophils Relative: 0
Eosinophils Absolute: 0 — ABNORMAL LOW
Eosinophils Relative: 0
Monocytes Absolute: 1
Monocytes Relative: 6
Neutro Abs: 6.9
Neutrophils Relative %: 65

## 2011-03-12 ENCOUNTER — Encounter (HOSPITAL_BASED_OUTPATIENT_CLINIC_OR_DEPARTMENT_OTHER): Payer: Self-pay | Admitting: *Deleted

## 2011-03-17 ENCOUNTER — Encounter (HOSPITAL_BASED_OUTPATIENT_CLINIC_OR_DEPARTMENT_OTHER): Payer: Self-pay | Admitting: Certified Registered"

## 2011-03-17 ENCOUNTER — Encounter (HOSPITAL_BASED_OUTPATIENT_CLINIC_OR_DEPARTMENT_OTHER)
Admission: RE | Disposition: A | Discharge status: Home / Self Care | Payer: Self-pay | Source: Ambulatory Visit | Attending: Plastic Surgery

## 2011-03-17 ENCOUNTER — Other Ambulatory Visit: Payer: Self-pay | Admitting: Plastic Surgery

## 2011-03-17 ENCOUNTER — Ambulatory Visit (HOSPITAL_BASED_OUTPATIENT_CLINIC_OR_DEPARTMENT_OTHER)
Admission: RE | Admit: 2011-03-17 | Discharge: 2011-03-17 | Disposition: A | Discharge status: Home / Self Care | Payer: 59 | Source: Ambulatory Visit | Attending: Plastic Surgery | Admitting: Plastic Surgery

## 2011-03-17 ENCOUNTER — Encounter (HOSPITAL_BASED_OUTPATIENT_CLINIC_OR_DEPARTMENT_OTHER): Payer: Self-pay

## 2011-03-17 ENCOUNTER — Ambulatory Visit (HOSPITAL_BASED_OUTPATIENT_CLINIC_OR_DEPARTMENT_OTHER): Payer: 59 | Admitting: Certified Registered"

## 2011-03-17 DIAGNOSIS — Z01812 Encounter for preprocedural laboratory examination: Secondary | ICD-10-CM | POA: Insufficient documentation

## 2011-03-17 DIAGNOSIS — F411 Generalized anxiety disorder: Secondary | ICD-10-CM | POA: Insufficient documentation

## 2011-03-17 DIAGNOSIS — F3289 Other specified depressive episodes: Secondary | ICD-10-CM | POA: Insufficient documentation

## 2011-03-17 DIAGNOSIS — F329 Major depressive disorder, single episode, unspecified: Secondary | ICD-10-CM | POA: Insufficient documentation

## 2011-03-17 DIAGNOSIS — R51 Headache: Secondary | ICD-10-CM | POA: Insufficient documentation

## 2011-03-17 DIAGNOSIS — N62 Hypertrophy of breast: Secondary | ICD-10-CM | POA: Insufficient documentation

## 2011-03-17 HISTORY — DX: Nausea with vomiting, unspecified: R11.2

## 2011-03-17 HISTORY — PX: BREAST REDUCTION SURGERY: SHX8

## 2011-03-17 HISTORY — DX: Depression, unspecified: F32.A

## 2011-03-17 HISTORY — DX: Anxiety disorder, unspecified: F41.9

## 2011-03-17 HISTORY — DX: Major depressive disorder, single episode, unspecified: F32.9

## 2011-03-17 HISTORY — DX: Other specified postprocedural states: Z98.890

## 2011-03-17 LAB — POCT HEMOGLOBIN-HEMACUE: Hemoglobin: 14.7 g/dL (ref 12.0–15.0)

## 2011-03-17 SURGERY — MAMMOPLASTY, REDUCTION
Anesthesia: General | Laterality: Bilateral

## 2011-03-17 MED ORDER — SCOPOLAMINE 1 MG/3DAYS TD PT72: 1.0000 | MEDICATED_PATCH | Freq: Once | TRANSDERMAL | Status: DC

## 2011-03-17 MED ORDER — EPHEDRINE SULFATE 50 MG/ML IJ SOLN
INTRAMUSCULAR | Status: DC | PRN
  Administered 2011-03-17 (×3): 10 mg via INTRAVENOUS

## 2011-03-17 MED ORDER — ONDANSETRON HCL 4 MG/2ML IJ SOLN: 4.0000 mg | Freq: Once | INTRAMUSCULAR | Status: DC | PRN

## 2011-03-17 MED ORDER — EPINEPHRINE HCL 1 MG/ML IJ SOLN
INTRAMUSCULAR | Status: DC | PRN
  Administered 2011-03-17: .5 mL via SUBCUTANEOUS

## 2011-03-17 MED ORDER — BUPIVACAINE-EPINEPHRINE 0.5% -1:200000 IJ SOLN
INTRAMUSCULAR | Status: DC | PRN
  Administered 2011-03-17: 40 mL

## 2011-03-17 MED ORDER — CEFAZOLIN SODIUM-DEXTROSE 2-3 GM-% IV SOLR
2.0000 g | INTRAVENOUS | Status: AC
  Administered 2011-03-17: 2 g via INTRAVENOUS

## 2011-03-17 MED ORDER — ONDANSETRON HCL 4 MG/2ML IJ SOLN
INTRAMUSCULAR | Status: DC | PRN
  Administered 2011-03-17: 4 mg via INTRAVENOUS

## 2011-03-17 MED ORDER — MIDAZOLAM HCL 5 MG/5ML IJ SOLN
INTRAMUSCULAR | Status: DC | PRN
  Administered 2011-03-17: 2 mg via INTRAVENOUS

## 2011-03-17 MED ORDER — BACITRACIN ZINC 500 UNIT/GM EX OINT
TOPICAL_OINTMENT | CUTANEOUS | Status: DC | PRN
  Administered 2011-03-17: 1 via TOPICAL

## 2011-03-17 MED ORDER — LACTATED RINGERS IV SOLN
INTRAVENOUS | Status: DC | PRN
  Administered 2011-03-17 (×2): via INTRAVENOUS

## 2011-03-17 MED ORDER — PROPOFOL 10 MG/ML IV EMUL
INTRAVENOUS | Status: DC | PRN
  Administered 2011-03-17: 250 mg via INTRAVENOUS

## 2011-03-17 MED ORDER — SUCCINYLCHOLINE CHLORIDE 20 MG/ML IJ SOLN
INTRAMUSCULAR | Status: DC | PRN
  Administered 2011-03-17: 140 mg via INTRAVENOUS

## 2011-03-17 MED ORDER — HYDROMORPHONE HCL PF 1 MG/ML IJ SOLN
0.2500 mg | INTRAMUSCULAR | Status: DC | PRN
  Administered 2011-03-17 (×3): 0.5 mg via INTRAVENOUS

## 2011-03-17 MED ORDER — FENTANYL CITRATE 0.05 MG/ML IJ SOLN
INTRAMUSCULAR | Status: DC | PRN
  Administered 2011-03-17: 50 ug via INTRAVENOUS
  Administered 2011-03-17: 100 ug via INTRAVENOUS
  Administered 2011-03-17 (×3): 50 ug via INTRAVENOUS
  Administered 2011-03-17: 25 ug via INTRAVENOUS

## 2011-03-17 MED ORDER — LIDOCAINE HCL (PF) 1 % IJ SOLN
INTRAMUSCULAR | Status: DC | PRN
  Administered 2011-03-17: 30 mL

## 2011-03-17 MED ORDER — MEPERIDINE HCL 25 MG/ML IJ SOLN: 6.2500 mg | INTRAMUSCULAR | Status: DC | PRN

## 2011-03-17 MED ORDER — DEXAMETHASONE SODIUM PHOSPHATE 4 MG/ML IJ SOLN
INTRAMUSCULAR | Status: DC | PRN
  Administered 2011-03-17: 10 mg via INTRAVENOUS

## 2011-03-17 MED ORDER — LIDOCAINE-EPINEPHRINE 1 %-1:100000 IJ SOLN
INTRAMUSCULAR | Status: DC | PRN
  Administered 2011-03-17: 40 mL

## 2011-03-17 MED ORDER — SCOPOLAMINE 1 MG/3DAYS TD PT72
MEDICATED_PATCH | TRANSDERMAL | Status: DC | PRN
  Administered 2011-03-17: 1 via TRANSDERMAL

## 2011-03-17 MED ORDER — LACTATED RINGERS IV SOLN
INTRAVENOUS | Status: DC | PRN
  Administered 2011-03-17: 500 mL via INTRAVENOUS

## 2011-03-17 MED ORDER — LACTATED RINGERS IV SOLN
INTRAVENOUS | Status: DC
  Administered 2011-03-17 (×2): via INTRAVENOUS

## 2011-03-17 MED ORDER — LIDOCAINE HCL (CARDIAC) 20 MG/ML IV SOLN
INTRAVENOUS | Status: DC | PRN
  Administered 2011-03-17: 100 mg via INTRAVENOUS

## 2011-03-17 MED ORDER — CEFAZOLIN SODIUM 1-5 GM-% IV SOLN: 1.0000 g | INTRAVENOUS | Status: DC

## 2011-03-17 SURGICAL SUPPLY — 58 items
BANDAGE GAUZE ELAST BULKY 4 IN (GAUZE/BANDAGES/DRESSINGS) ×4 IMPLANT
BENZOIN TINCTURE PRP APPL 2/3 (GAUZE/BANDAGES/DRESSINGS) ×4 IMPLANT
BLADE KNIFE PERSONA 10 (BLADE) ×8 IMPLANT
BLADE KNIFE PERSONA 15 (BLADE) ×6 IMPLANT
CANISTER SUCTION 1200CC (MISCELLANEOUS) ×2 IMPLANT
CAP BOUFFANT 24 BLUE NURSES (PROTECTIVE WEAR) ×2 IMPLANT
CLOTH BEACON ORANGE TIMEOUT ST (SAFETY) ×2 IMPLANT
COVER MAYO STAND STRL (DRAPES) ×2 IMPLANT
COVER TABLE BACK 60X90 (DRAPES) ×2 IMPLANT
DECANTER SPIKE VIAL GLASS SM (MISCELLANEOUS) ×4 IMPLANT
DRAIN CHANNEL 10F 3/8 F FF (DRAIN) ×4 IMPLANT
DRAPE LAPAROSCOPIC ABDOMINAL (DRAPES) ×2 IMPLANT
DRSG EMULSION OIL 3X3 NADH (GAUZE/BANDAGES/DRESSINGS) ×2 IMPLANT
DRSG PAD ABDOMINAL 8X10 ST (GAUZE/BANDAGES/DRESSINGS) ×2 IMPLANT
ELECT NEEDLE TIP 2.8 STRL (NEEDLE) IMPLANT
ELECT REM PT RETURN 9FT ADLT (ELECTROSURGICAL) ×2
ELECTRODE REM PT RTRN 9FT ADLT (ELECTROSURGICAL) ×1 IMPLANT
EVACUATOR SILICONE 100CC (DRAIN) ×4 IMPLANT
FILTER 7/8 IN (FILTER) ×2 IMPLANT
GAUZE SPONGE 4X4 8PLY STR LF (GAUZE/BANDAGES/DRESSINGS) ×2 IMPLANT
GLOVE BIO SURGEON STRL SZ 6.5 (GLOVE) ×2 IMPLANT
GLOVE BIOGEL PI IND STRL 6.5 (GLOVE) IMPLANT
GLOVE BIOGEL PI INDICATOR 6.5 (GLOVE)
GLOVE ECLIPSE 6.5 STRL STRAW (GLOVE) ×8 IMPLANT
GOWN PREVENTION PLUS XLARGE (GOWN DISPOSABLE) ×4 IMPLANT
GOWN PREVENTION PLUS XXLARGE (GOWN DISPOSABLE) IMPLANT
NEEDLE HYPO 25X1 1.5 SAFETY (NEEDLE) ×2 IMPLANT
NEEDLE SPNL 18GX3.5 QUINCKE PK (NEEDLE) ×2 IMPLANT
NS IRRIG 1000ML POUR BTL (IV SOLUTION) ×4 IMPLANT
PACK BASIN DAY SURGERY FS (CUSTOM PROCEDURE TRAY) ×2 IMPLANT
PIN SAFETY STERILE (MISCELLANEOUS) ×2 IMPLANT
SCRUB PCMX 4 OZ (MISCELLANEOUS) ×2 IMPLANT
SCRUB TECHNI CARE SURGICAL (MISCELLANEOUS) IMPLANT
SLEEVE SCD COMPRESS KNEE MED (MISCELLANEOUS) ×2 IMPLANT
SPECIMEN JAR MEDIUM (MISCELLANEOUS) ×4 IMPLANT
SPECIMEN JAR X LARGE (MISCELLANEOUS) IMPLANT
SPONGE GAUZE 4X4 12PLY (GAUZE/BANDAGES/DRESSINGS) ×4 IMPLANT
SPONGE LAP 18X18 X RAY DECT (DISPOSABLE) ×6 IMPLANT
STAPLER VISISTAT 35W (STAPLE) ×4 IMPLANT
STRIP CLOSURE SKIN 1/2X4 (GAUZE/BANDAGES/DRESSINGS) ×8 IMPLANT
SUT ETHILON 3 0 PS 1 (SUTURE) ×2 IMPLANT
SUT MNCRL AB 3-0 PS2 18 (SUTURE) ×8 IMPLANT
SUT MNCRL AB 4-0 PS2 18 (SUTURE) ×4 IMPLANT
SUT MON AB 5-0 PS2 18 (SUTURE) ×4 IMPLANT
SUT PROLENE 2 0 CT2 30 (SUTURE) ×2 IMPLANT
SUT PROLENE 3 0 PS 1 (SUTURE) ×4 IMPLANT
SUT QUILL PDO 2-0 (SUTURE) ×4 IMPLANT
SYR 50ML LL SCALE MARK (SYRINGE) ×4 IMPLANT
SYR BULB IRRIGATION 50ML (SYRINGE) ×4 IMPLANT
SYR CONTROL 10ML LL (SYRINGE) ×6 IMPLANT
TOWEL OR 17X24 6PK STRL BLUE (TOWEL DISPOSABLE) ×6 IMPLANT
TRAY DSU PREP LF (CUSTOM PROCEDURE TRAY) ×2 IMPLANT
TRAY FOLEY CATH 14FR (SET/KITS/TRAYS/PACK) ×2 IMPLANT
TUBE CONNECTING 20X1/4 (TUBING) ×2 IMPLANT
UNDERPAD 30X30 INCONTINENT (UNDERPADS AND DIAPERS) ×2 IMPLANT
VAC PENCILS W/TUBING CLEAR (MISCELLANEOUS) ×2 IMPLANT
WATER STERILE IRR 1000ML POUR (IV SOLUTION) IMPLANT
YANKAUER SUCT BULB TIP NO VENT (SUCTIONS) ×4 IMPLANT

## 2011-03-17 NOTE — Anesthesia Postprocedure Evaluation (Signed)
  Anesthesia Post-op Note  Patient: [name]  Procedure(s) Performed:  MAMMARY REDUCTION BILATERAL (BREAST)  Patient Location: PACU  Anesthesia Type: General  Level of Consciousness: awake, alert  and oriented  Airway and Oxygen Therapy: Patient Spontanous Breathing  Post-op Pain: mild  Post-op Assessment: Post-op Vital signs reviewed, Patient's Cardiovascular Status Stable, Respiratory Function Stable, Patent Airway, No signs of Nausea or vomiting, Adequate PO intake and Pain level controlled  Post-op Vital Signs: stable  Complications: No apparent anesthesia complications

## 2011-03-17 NOTE — H&P (Signed)
  Addendum:  H & P updated. No changes. Paper copy on chart.

## 2011-03-17 NOTE — H&P (Signed)
45 yo female with bilateral macromastia that is clinically symptomatic presents for Bilateral reduction Mammaplasties.

## 2011-03-17 NOTE — Anesthesia Procedure Notes (Addendum)
Performed by: Radford Pax    Procedure Name: Intubation Date/Time: 03/17/2011 8:33 AM Performed by: Radford Pax Pre-anesthesia Checklist: Patient identified, Emergency Drugs available, Suction available, Patient being monitored and Timeout performed Patient Re-evaluated:Patient Re-evaluated prior to inductionOxygen Delivery Method: Circle System Utilized Preoxygenation: Pre-oxygenation with 100% oxygen Intubation Type: IV induction Ventilation: Mask ventilation without difficulty Laryngoscope Size: Miller and 3 Grade View: Grade II Tube type: Oral Tube size: 7.0 mm Number of attempts: 1 Airway Equipment and Method: stylet and oral airway Placement Confirmation: ETT inserted through vocal cords under direct vision,  positive ETCO2 and breath sounds checked- equal and bilateral Secured at: 21 cm Tube secured with: Tape Dental Injury: Teeth and Oropharynx as per pre-operative assessment

## 2011-03-17 NOTE — Brief Op Note (Signed)
03/17/2011  12:18 PM  PATIENT:  [name]  45 y.o. female  PRE-OPERATIVE DIAGNOSIS:  macromastia  POST-OPERATIVE DIAGNOSIS:  macromastia  PROCEDURE:  Procedure(s): MAMMARY REDUCTION BILATERAL (BREAST)  SURGEON: Eloise Levels, M.D.   ASSISTANTS: None   ANESTHESIA:   general  EBL:  Total I/O In: 2800 [I.V.:2800] Out: 175 [Urine:75; Blood:100]   LOCAL MEDICATIONS USED:  MARCAINE 40CC  SPECIMEN:  Source of Specimen:  Bilateral Breast Tissue  DISPOSITION OF SPECIMEN:  PATHOLOGY  COUNTS:  YES  DICTATION: .Other Dictation: Dictation Number [id number]  PLAN OF CARE: Discharge to home after PACU  PATIENT DISPOSITION:  PACU - hemodynamically stable.   Delay start of Pharmacological VTE agent (>24hrs) due to surgical blood loss or risk of bleeding:  not applicable

## 2011-03-17 NOTE — Transfer of Care (Signed)
Immediate Anesthesia Transfer of Care Note  Patient: [name]  Procedure(s) Performed:  MAMMARY REDUCTION BILATERAL (BREAST)  Patient Location: PACU  Anesthesia Type: General  Level of Consciousness: awake, sedated and patient cooperative  Airway & Oxygen Therapy: Patient Spontanous Breathing and Patient connected to face mask oxygen  Post-op Assessment: Report given to PACU RN and Post -op Vital signs reviewed and stable  Post vital signs: Reviewed and stable  Complications: No apparent anesthesia complications

## 2011-03-17 NOTE — Anesthesia Postprocedure Evaluation (Signed)
  Anesthesia Post-op Note  Patient: [name]  Procedure(s) Performed:  MAMMARY REDUCTION BILATERAL (BREAST)  Patient Location: PACU  Anesthesia Type: General  Level of Consciousness: awake, alert  and oriented  Airway and Oxygen Therapy: Patient Spontanous Breathing and Patient connected to face mask oxygen  Post-op Pain: mild  Post-op Assessment: Post-op Vital signs reviewed, Patient's Cardiovascular Status Stable, Respiratory Function Stable, Patent Airway, No signs of Nausea or vomiting and Pain level controlled  Post-op Vital Signs: Reviewed and stable  Complications: No apparent anesthesia complications

## 2011-03-17 NOTE — Anesthesia Preprocedure Evaluation (Addendum)
Anesthesia Evaluation  Patient identified by MRN, date of birth, ID band Patient awake    Reviewed: Allergy & Precautions, H&P , NPO status , Patient's Chart, lab work & pertinent test results  History of Anesthesia Complications (+) PONV  Airway Mallampati: I TM Distance: >3 FB Neck ROM: Full    Dental No notable dental hx. (+) Teeth Intact and Dental Advisory Given   Pulmonary    Pulmonary exam normal       Cardiovascular neg cardio ROS     Neuro/Psych  Headaches, PSYCHIATRIC DISORDERS Anxiety Depression    GI/Hepatic negative GI ROS, Neg liver ROS,   Endo/Other  Negative Endocrine ROS  Renal/GU negative Renal ROS     Musculoskeletal   Abdominal   Peds  Hematology negative hematology ROS (+)   Anesthesia Other Findings   Reproductive/Obstetrics                       Anesthesia Physical Anesthesia Plan  ASA: III  Anesthesia Plan: General   Post-op Pain Management:    Induction: Intravenous  Airway Management Planned: Oral ETT  Additional Equipment:   Intra-op Plan:   Post-operative Plan: Extubation in OR  Informed Consent: I have reviewed the patients History and Physical, chart, labs and discussed the procedure including the risks, benefits and alternatives for the proposed anesthesia with the patient or authorized representative who has indicated his/her understanding and acceptance.   Dental advisory given  Plan Discussed with: CRNA and Surgeon  Anesthesia Plan Comments:         Anesthesia Quick Evaluation

## 2011-03-17 NOTE — Op Note (Signed)
NAME:  [name], [name]                     ACCOUNT NO.:  MEDICAL RECORD NO.:  [id number]  LOCATION:                                 FACILITY:  PHYSICIAN:  Brantley Persons, M.D.DATE OF BIRTH:  1966-04-26  DATE OF PROCEDURE:  03/17/2011 DATE OF DISCHARGE:                              OPERATIVE REPORT   PREOPERATIVE DIAGNOSIS:  Bilateral macromastia.  POSTOPERATIVE DIAGNOSIS:  Bilateral macromastia.  PROCEDURE:  Bilateral reduction mammoplasties.  ATTENDING SURGEON:  Brantley Persons, MD  ANESTHESIA:  General.  ANESTHESIOLOGIST:  Sheldon Silvan, MD  COMPLICATIONS:  None.  FLUID REPLACEMENT:  2800 mL crystalloid.  URINE OUTPUT:  100 mL.  ESTIMATED BLOOD LOSS:  100 mL.  INDICATIONS FOR THE PROCEDURE:  The patient is a 45 year old Caucasian female has bilateral macromastia that is clinically symptomatic.  She presents to undergo bilateral reduction mammoplasties.  DESCRIPTION OF PROCEDURE:  The patient was brought into preop holding area and the breasts were marked in the pattern of Wise for the future bilateral reduction mammoplasties.  She was then taken back to the OR, placed on the table in supine position.  After adequate anesthesia was obtained, the chest was prepped with Techni-Care, draped in sterile fashion.  The bases of the breast was then injected 1% lidocaine with epinephrine.  After adequate hemostasis and anesthesia had taken effect, the procedure was begun.  Both of the breast reductions were performed in the following similar manner.  The nipple-areolar complex was marked with a 45-mm nipple marker.  The skin was then incised and deepithelialized around the nipple-areolar complex down to the inframammary crease in inferior pedicle pattern.  Next, the medial, superior, and lateral skin flaps were elevated down to the chest wall.  The excess fat and glandular tissue removed from the inferior pedicle.  The nipple-areolar complex was examined and found to be  pink and viable.  The wound was irrigated with saline irrigation.  Meticulous hemostasis was obtained with the Bovie electrocautery.  The inferior pedicle was centralized using 3-0 Prolene suture.  A #10 JP flat fully fluted drain was placed into the wound.  The skin flaps brought together at the inverted T junction with a 2-0 Prolene suture.  The incisions were stapled for temporary closure. The breasts compared to have good shape and symmetry.  The incisions were then closed from the medial aspect of the JP drain to the medial aspect of the [facility] incision by first placing a few 3-0 Monocryl sutures to loosely tack together the dermal layer and then both the dermal and cuticular layers were closed in a single layer using a 2-0 Quill PDO barbed suture.  Lateral to the JP drain incision was closed using 3-0 Monocryl and the dermal layer followed by 3-0 Monocryl running intracuticular stitch on skin.  The vertical limb of the Wise pattern incision was closed with 3-0 Monocryl in the dermal layer.  The patient was a pleasant upright position.  The future location the nipple-areolar complex was marked on both breast mounds using the 45 mm nipple marker.  She was then placed back in the recumbent position.  Both of the nipple-areolar complexes were brought out onto  the breast mounds in the following similar manner.  The skin was incised and marked and removed full thickness into the subcutaneous tissues.  The nipple- areolar complex was examined, found to be pink and viable, then brought out through this aperture sewn in place using 4-0 Monocryl in the dermal layer followed by 5-0 Monocryl running in the intracuticular stitch. The cuticular layer of the vertical limb was also closed in continuity with the 5-0 Monocryl intracuticular suture.  The JP drain was sewn in place using 3-0 nylon suture.  The base of the breast skin and soft tissues were infiltrated with 0.5% Marcaine with epinephrine  to provide a postsurgical anesthetic block.  Prior to closing the breast, both of the pectoralis major muscles had been injected with 0.5% Marcaine with epinephrine as well for the past anesthetic block.  The incisions were dressed with benzoin and Steri-Strips.  The nipples additionally with bacitracin ointment and Adaptic.  4x4s were placed over the incisions. The patient was placed into light postoperative support bra.  There were no complications.  The patient tolerated procedure well.  The final needle, sponge counts reported to be correct at the end the case. The patient was then extubated, taken recovery room in stable condition. The patient was recovered without complications.  Both the patient and her family were given proper postoperative wound care instructions including care of the JP drains.  She was then discharged in the care of her family in stable condition.  Follow up appointment will be Thursday in the office.          ______________________________ Brantley Persons, M.D.     MC/MEDQ  D:  03/17/2011  T:  03/17/2011  Job:  [id number]

## 2011-03-23 ENCOUNTER — Encounter (HOSPITAL_BASED_OUTPATIENT_CLINIC_OR_DEPARTMENT_OTHER): Payer: Self-pay | Admitting: Plastic Surgery

## 2011-08-06 ENCOUNTER — Other Ambulatory Visit: Payer: Self-pay | Admitting: Obstetrics and Gynecology

## 2011-08-06 DIAGNOSIS — N63 Unspecified lump in unspecified breast: Secondary | ICD-10-CM

## 2011-08-07 ENCOUNTER — Ambulatory Visit
Admission: RE | Admit: 2011-08-07 | Discharge: 2011-08-07 | Disposition: A | Discharge status: Home / Self Care | Payer: 59 | Source: Ambulatory Visit | Attending: Obstetrics and Gynecology | Admitting: Obstetrics and Gynecology

## 2011-08-07 DIAGNOSIS — N63 Unspecified lump in unspecified breast: Secondary | ICD-10-CM

## 2011-08-12 ENCOUNTER — Other Ambulatory Visit: Payer: 59

## 2012-07-25 ENCOUNTER — Other Ambulatory Visit: Payer: Self-pay | Admitting: Obstetrics and Gynecology

## 2012-07-25 DIAGNOSIS — M7989 Other specified soft tissue disorders: Secondary | ICD-10-CM

## 2012-08-08 ENCOUNTER — Ambulatory Visit
Admission: RE | Admit: 2012-08-08 | Discharge: 2012-08-08 | Disposition: A | Discharge status: Home / Self Care | Payer: 59 | Source: Ambulatory Visit | Attending: Obstetrics and Gynecology | Admitting: Obstetrics and Gynecology

## 2012-08-08 DIAGNOSIS — M7989 Other specified soft tissue disorders: Secondary | ICD-10-CM

## 2013-05-10 ENCOUNTER — Encounter (INDEPENDENT_AMBULATORY_CARE_PROVIDER_SITE_OTHER): Payer: Self-pay | Admitting: *Deleted

## 2013-05-18 ENCOUNTER — Other Ambulatory Visit (INDEPENDENT_AMBULATORY_CARE_PROVIDER_SITE_OTHER): Payer: Self-pay | Admitting: *Deleted

## 2013-05-18 ENCOUNTER — Encounter (INDEPENDENT_AMBULATORY_CARE_PROVIDER_SITE_OTHER): Payer: Self-pay | Admitting: *Deleted

## 2013-05-18 ENCOUNTER — Telehealth (INDEPENDENT_AMBULATORY_CARE_PROVIDER_SITE_OTHER): Payer: Self-pay | Admitting: *Deleted

## 2013-05-18 DIAGNOSIS — Z1211 Encounter for screening for malignant neoplasm of colon: Secondary | ICD-10-CM

## 2013-05-18 MED ORDER — PEG-KCL-NACL-NASULF-NA ASC-C 100 G PO SOLR: 1.0000 | Freq: Once | ORAL | Status: DC

## 2013-05-18 NOTE — Telephone Encounter (Signed)
Patient needs movi prep 

## 2013-06-21 ENCOUNTER — Telehealth (INDEPENDENT_AMBULATORY_CARE_PROVIDER_SITE_OTHER): Payer: Self-pay | Admitting: *Deleted

## 2013-06-21 NOTE — Telephone Encounter (Signed)
  Procedure: tcs  Reason/Indication:  screening  Has patient had this procedure before?  no  If so, when, by whom and where?    Is there a family history of colon cancer?  no  Who?  What age when diagnosed?    Is patient diabetic?   no      Does patient have prosthetic heart valve?  no  Do you have a pacemaker?  no  Has patient ever had endocarditis? no  Has patient had joint replacement within last 12 months?  no  Does patient tend to be constipated or take laxatives? no  Is patient on Coumadin, Plavix and/or Aspirin? no  Medications: zoloft 100 mg daily  Allergies: nkda  Medication Adjustment:   Procedure date & time: 07/13/13 at 930

## 2013-06-22 NOTE — Telephone Encounter (Signed)
agree

## 2013-06-27 ENCOUNTER — Encounter (HOSPITAL_COMMUNITY): Payer: Self-pay | Admitting: Pharmacy Technician

## 2013-07-13 ENCOUNTER — Encounter (HOSPITAL_COMMUNITY)
Admission: RE | Disposition: A | Discharge status: Home / Self Care | Payer: Self-pay | Source: Ambulatory Visit | Attending: Internal Medicine

## 2013-07-13 ENCOUNTER — Encounter (HOSPITAL_COMMUNITY): Payer: Self-pay | Admitting: *Deleted

## 2013-07-13 ENCOUNTER — Ambulatory Visit (HOSPITAL_COMMUNITY)
Admission: RE | Admit: 2013-07-13 | Discharge: 2013-07-13 | Disposition: A | Discharge status: Home / Self Care | Payer: 59 | Source: Ambulatory Visit | Attending: Internal Medicine | Admitting: Internal Medicine

## 2013-07-13 DIAGNOSIS — K644 Residual hemorrhoidal skin tags: Secondary | ICD-10-CM

## 2013-07-13 DIAGNOSIS — Z1211 Encounter for screening for malignant neoplasm of colon: Secondary | ICD-10-CM

## 2013-07-13 DIAGNOSIS — Z79899 Other long term (current) drug therapy: Secondary | ICD-10-CM | POA: Insufficient documentation

## 2013-07-13 DIAGNOSIS — F411 Generalized anxiety disorder: Secondary | ICD-10-CM | POA: Insufficient documentation

## 2013-07-13 DIAGNOSIS — D126 Benign neoplasm of colon, unspecified: Secondary | ICD-10-CM

## 2013-07-13 HISTORY — PX: COLONOSCOPY: SHX5424

## 2013-07-13 SURGERY — COLONOSCOPY
Anesthesia: Moderate Sedation

## 2013-07-13 MED ORDER — MIDAZOLAM HCL 5 MG/5ML IJ SOLN
INTRAMUSCULAR | Status: DC | PRN
  Administered 2013-07-13 (×7): 2 mg via INTRAVENOUS

## 2013-07-13 MED ORDER — MIDAZOLAM HCL 5 MG/5ML IJ SOLN
INTRAMUSCULAR | Status: AC
  Filled 2013-07-13: qty 5

## 2013-07-13 MED ORDER — MEPERIDINE HCL 50 MG/ML IJ SOLN
INTRAMUSCULAR | Status: AC
  Filled 2013-07-13: qty 1

## 2013-07-13 MED ORDER — SIMETHICONE 40 MG/0.6ML PO SUSP
ORAL | Status: DC | PRN
  Administered 2013-07-13: 10:00:00

## 2013-07-13 MED ORDER — MIDAZOLAM HCL 5 MG/5ML IJ SOLN
INTRAMUSCULAR | Status: AC
  Filled 2013-07-13: qty 10

## 2013-07-13 MED ORDER — MEPERIDINE HCL 50 MG/ML IJ SOLN
INTRAMUSCULAR | Status: DC | PRN
  Administered 2013-07-13 (×2): 25 mg via INTRAVENOUS

## 2013-07-13 MED ORDER — SODIUM CHLORIDE 0.9 % IV SOLN
INTRAVENOUS | Status: DC
  Administered 2013-07-13: 09:00:00 via INTRAVENOUS

## 2013-07-13 NOTE — Op Note (Signed)
COLONOSCOPY PROCEDURE REPORT  PATIENT:  [name]  MR#:  [id number] Birthdate:  [birth year], 48 y.o., female Endoscopist:  Dr. Rogene Houston, MD Referred By:  Dr. [name], MD  Procedure Date: 07/13/2013  Procedure:   Colonoscopy  Indications:  Patient is 48 year old Caucasian female who is  undergoing average risk screening colonoscopy.  Informed Consent:  The procedure and risks were reviewed with the patient and informed consent was obtained.  Medications:  Demerol 50 mg IV Versed 14 mg IV  Description of procedure:  After a digital rectal exam was performed, that colonoscope was advanced from the anus through the rectum and colon to the area of the cecum, ileocecal valve and appendiceal orifice. The cecum was deeply intubated. These structures were well-seen and photographed for the record. From the level of the cecum and ileocecal valve, the scope was slowly and cautiously withdrawn. The mucosal surfaces were carefully surveyed utilizing scope tip to flexion to facilitate fold flattening as needed. The scope was pulled down into the rectum where a thorough exam including retroflexion was performed.  Findings:   Prep excellent. 3 mm polyp ablated via cold biopsy from splenic flexure. Normal rectal mucosa. Small hemorrhoids noted below the dentate line.   Therapeutic/Diagnostic Maneuvers Performed:  See above  Complications:  None  Cecal Withdrawal Time:  8 minutes  Impression:  Examination performed to cecum. Small polyp ablated via cold biopsy from splenic flexure. External hemorrhoids.  Recommendations:  Standard instructions given. I will contact patient with biopsy results and further recommendations.  REHMAN,NAJEEB U  07/13/2013 10:35 AM  CC: Dr. [name], MD & Dr. Rayne Du ref. provider found

## 2013-07-13 NOTE — Discharge Instructions (Signed)
Resume usual medications and diet. °No driving for 24 hours. °Physician will contact you with biopsy results. ° °Colonoscopy, Care After °Refer to this sheet in the next few weeks. These instructions provide you with information on caring for yourself after your procedure. Your health care provider may also give you more specific instructions. Your treatment has been planned according to current medical practices, but problems sometimes occur. Call your health care provider if you have any problems or questions after your procedure. °WHAT TO EXPECT AFTER THE PROCEDURE  °After your procedure, it is typical to have the following: °· A small amount of blood in your stool. °· Moderate amounts of gas and mild abdominal cramping or bloating. °HOME CARE INSTRUCTIONS °· Do not drive, operate machinery, or sign important documents for 24 hours. °· You may shower and resume your regular physical activities, but move at a slower pace for the first 24 hours. °· Take frequent rest periods for the first 24 hours. °· Walk around or put a warm pack on your abdomen to help reduce abdominal cramping and bloating. °· Drink enough fluids to keep your urine clear or pale yellow. °· You may resume your normal diet as instructed by your health care provider. Avoid heavy or fried foods that are hard to digest. °· Avoid drinking alcohol for 24 hours or as instructed by your health care provider. °· Only take over-the-counter or prescription medicines as directed by your health care provider. °· If a tissue sample (biopsy) was taken during your procedure: °· Do not take aspirin or blood thinners for 7 days, or as instructed by your health care provider. °· Do not drink alcohol for 7 days, or as instructed by your health care provider. °· Eat soft foods for the first 24 hours. °SEEK MEDICAL CARE IF: °You have persistent spotting of blood in your stool 2 3 days after the procedure. °SEEK IMMEDIATE MEDICAL CARE IF: °· You have more than a small  spotting of blood in your stool. °· You pass large blood clots in your stool. °· Your abdomen is swollen (distended). °· You have nausea or vomiting. °· You have a fever. °· You have increasing abdominal pain that is not relieved with medicine. °Document Released: 12/10/2003 Document Revised: 02/15/2013 Document Reviewed: 01/02/2013 °Exit[employer]® Patient Information ©2014 [employer]. ° °

## 2013-07-13 NOTE — H&P (Signed)
[name] is an 48 y.o. female.   Chief Complaint: Patient is here for colonoscopy. HPI: Patient is 48 year old Caucasian female with colonoscopy for she denies abdominal pain change of bowel habits or rectal bleeding. Family history is negative for CRC.  Past Medical History  Diagnosis Date  . PONV (postoperative nausea and vomiting)     pt states scope patch does well  . Anemia   . Depression   . Anxiety   . Headache(784.0)     has migraines - medication controls    Past Surgical History  Procedure Laterality Date  . Right rotator cuff      2002  . Neck fusion      2003  . Laparoscopic cholecystectomy      2004  . Right knee arthroscopy      2005  . Colon surgery      2008  . Appendectomy      2008  . Abdominal hysterectomy    . Breast reduction surgery  03/17/2011    Procedure: MAMMARY REDUCTION BILATERAL (BREAST);  Surgeon: Mary A Contogiannis;  Location: [facility];  Service: Plastics;  Laterality: Bilateral;    Family History  Problem Relation Age of Onset  . Colon cancer Neg Hx    Social History:  reports that she has quit smoking. Her smoking use included Cigarettes. She smoked 0.00 packs per day. She does not have any smokeless tobacco history on file. She reports that she drinks alcohol. She reports that she does not use illicit drugs.  Allergies: No Known Allergies  Medications Prior to Admission  Medication Sig Dispense Refill  . peg 3350 powder (MOVIPREP) 100 G SOLR Take 1 kit (200 g total) by mouth once.  1 kit  0  . sertraline (ZOLOFT) 100 MG tablet Take 150 mg by mouth every morning.         No results found for this or any previous visit (from the past 48 hour(s)). No results found.  ROS  Blood pressure 126/65, pulse 68, temperature 98.4 F (36.9 C), temperature source Oral, resp. rate 18, height '5\' 8"'  (1.727 m), weight 230 lb (104.327 kg), SpO2 98.00%. Physical Exam  Constitutional: She appears well-developed and  well-nourished.  HENT:  Mouth/Throat: Oropharynx is clear and moist.  Eyes: Conjunctivae are normal. No scleral icterus.  Neck: No thyromegaly present.  Cardiovascular: Normal rate, regular rhythm and normal heart sounds.   No murmur heard. Respiratory: Effort normal and breath sounds normal.  GI: Soft. She exhibits no distension and no mass. There is no tenderness.  Musculoskeletal: She exhibits no edema.  Lymphadenopathy:    She has no cervical adenopathy.  Neurological: She is alert.  Skin: Skin is warm and dry.     Assessment/Plan Average risk screening colonoscopy.  Naftula Donahue U 07/13/2013, 9:49 AM

## 2013-07-17 ENCOUNTER — Encounter (HOSPITAL_COMMUNITY): Payer: Self-pay | Admitting: Internal Medicine

## 2013-08-02 ENCOUNTER — Encounter (INDEPENDENT_AMBULATORY_CARE_PROVIDER_SITE_OTHER): Payer: Self-pay | Admitting: *Deleted

## 2013-12-13 ENCOUNTER — Other Ambulatory Visit: Payer: Self-pay | Admitting: Obstetrics and Gynecology

## 2013-12-14 LAB — CYTOLOGY - PAP

## 2014-04-23 ENCOUNTER — Encounter (HOSPITAL_COMMUNITY): Payer: Self-pay | Admitting: *Deleted

## 2014-04-23 ENCOUNTER — Emergency Department (HOSPITAL_COMMUNITY): Payer: 59

## 2014-04-23 ENCOUNTER — Observation Stay (HOSPITAL_COMMUNITY)
Admission: EM | Admit: 2014-04-23 | Discharge: 2014-04-27 | Disposition: A | Discharge status: Home / Self Care | Payer: 59 | Attending: Internal Medicine | Admitting: Internal Medicine

## 2014-04-23 DIAGNOSIS — R0789 Other chest pain: Secondary | ICD-10-CM | POA: Diagnosis not present

## 2014-04-23 DIAGNOSIS — R945 Abnormal results of liver function studies: Secondary | ICD-10-CM

## 2014-04-23 DIAGNOSIS — C787 Secondary malignant neoplasm of liver and intrahepatic bile duct: Secondary | ICD-10-CM

## 2014-04-23 DIAGNOSIS — E876 Hypokalemia: Secondary | ICD-10-CM | POA: Diagnosis present

## 2014-04-23 DIAGNOSIS — F329 Major depressive disorder, single episode, unspecified: Secondary | ICD-10-CM | POA: Diagnosis not present

## 2014-04-23 DIAGNOSIS — C7951 Secondary malignant neoplasm of bone: Secondary | ICD-10-CM | POA: Insufficient documentation

## 2014-04-23 DIAGNOSIS — K769 Liver disease, unspecified: Secondary | ICD-10-CM

## 2014-04-23 DIAGNOSIS — R079 Chest pain, unspecified: Secondary | ICD-10-CM | POA: Diagnosis present

## 2014-04-23 DIAGNOSIS — C229 Malignant neoplasm of liver, not specified as primary or secondary: Secondary | ICD-10-CM | POA: Diagnosis not present

## 2014-04-23 DIAGNOSIS — K219 Gastro-esophageal reflux disease without esophagitis: Secondary | ICD-10-CM | POA: Insufficient documentation

## 2014-04-23 DIAGNOSIS — Z23 Encounter for immunization: Secondary | ICD-10-CM | POA: Diagnosis not present

## 2014-04-23 DIAGNOSIS — G43909 Migraine, unspecified, not intractable, without status migrainosus: Secondary | ICD-10-CM | POA: Diagnosis not present

## 2014-04-23 DIAGNOSIS — Z79899 Other long term (current) drug therapy: Secondary | ICD-10-CM | POA: Diagnosis not present

## 2014-04-23 DIAGNOSIS — Z87891 Personal history of nicotine dependence: Secondary | ICD-10-CM | POA: Insufficient documentation

## 2014-04-23 DIAGNOSIS — C801 Malignant (primary) neoplasm, unspecified: Secondary | ICD-10-CM

## 2014-04-23 DIAGNOSIS — E785 Hyperlipidemia, unspecified: Secondary | ICD-10-CM | POA: Diagnosis present

## 2014-04-23 DIAGNOSIS — F32A Depression, unspecified: Secondary | ICD-10-CM | POA: Diagnosis present

## 2014-04-23 DIAGNOSIS — R7989 Other specified abnormal findings of blood chemistry: Secondary | ICD-10-CM | POA: Diagnosis present

## 2014-04-23 DIAGNOSIS — R109 Unspecified abdominal pain: Secondary | ICD-10-CM

## 2014-04-23 LAB — CBC WITH DIFFERENTIAL/PLATELET
BASOS ABS: 0 10*3/uL (ref 0.0–0.1)
Basophils Relative: 0 % (ref 0–1)
EOS ABS: 0.1 10*3/uL (ref 0.0–0.7)
EOS PCT: 1 % (ref 0–5)
HEMATOCRIT: 40 % (ref 36.0–46.0)
Hemoglobin: 13.3 g/dL (ref 12.0–15.0)
LYMPHS ABS: 1.6 10*3/uL (ref 0.7–4.0)
LYMPHS PCT: 16 % (ref 12–46)
MCH: 30.9 pg (ref 26.0–34.0)
MCHC: 33.3 g/dL (ref 30.0–36.0)
MCV: 93 fL (ref 78.0–100.0)
MONO ABS: 0.9 10*3/uL (ref 0.1–1.0)
Monocytes Relative: 9 % (ref 3–12)
Neutro Abs: 7.4 10*3/uL (ref 1.7–7.7)
Neutrophils Relative %: 74 % (ref 43–77)
Platelets: 232 10*3/uL (ref 150–400)
RBC: 4.3 MIL/uL (ref 3.87–5.11)
RDW: 13.5 % (ref 11.5–15.5)
WBC: 9.9 10*3/uL (ref 4.0–10.5)

## 2014-04-23 LAB — BASIC METABOLIC PANEL
Anion gap: 15 (ref 5–15)
BUN: 10 mg/dL (ref 6–23)
CALCIUM: 9.3 mg/dL (ref 8.4–10.5)
CO2: 20 mEq/L (ref 19–32)
CREATININE: 0.79 mg/dL (ref 0.50–1.10)
Chloride: 102 mEq/L (ref 96–112)
GFR calc Af Amer: >90 mL/min (ref >90)
GFR calc non Af Amer: >90 mL/min (ref >90)
GLUCOSE: 132 mg/dL — AB (ref 70–99)
Potassium: 3.6 mEq/L — ABNORMAL LOW (ref 3.7–5.3)
Sodium: 137 mEq/L (ref 137–147)

## 2014-04-23 LAB — TROPONIN I
Troponin I: <0.3 ng/mL (ref <0.30)
Troponin I: <0.3 ng/mL (ref <0.30)

## 2014-04-23 MED ORDER — KETOROLAC TROMETHAMINE 30 MG/ML IJ SOLN
30.0000 mg | Freq: Once | INTRAMUSCULAR | Status: AC
  Administered 2014-04-23: 30 mg via INTRAVENOUS
  Filled 2014-04-23: qty 1

## 2014-04-23 MED ORDER — GI COCKTAIL ~~LOC~~
30.0000 mL | Freq: Four times a day (QID) | ORAL | Status: DC | PRN
  Administered 2014-04-24 – 2014-04-26 (×2): 30 mL via ORAL
  Filled 2014-04-23 (×3): qty 30

## 2014-04-23 MED ORDER — ONDANSETRON HCL 4 MG/2ML IJ SOLN: 4.0000 mg | Freq: Four times a day (QID) | INTRAMUSCULAR | Status: DC | PRN

## 2014-04-23 MED ORDER — SODIUM CHLORIDE 0.9 % IV SOLN: 250.0000 mL | INTRAVENOUS | Status: DC | PRN

## 2014-04-23 MED ORDER — SERTRALINE HCL 50 MG PO TABS
150.0000 mg | ORAL_TABLET | Freq: Every day | ORAL | Status: DC
  Administered 2014-04-24 – 2014-04-26 (×3): 150 mg via ORAL
  Filled 2014-04-23 (×6): qty 3

## 2014-04-23 MED ORDER — POLYETHYLENE GLYCOL 3350 17 G PO PACK: 17.0000 g | PACK | Freq: Every day | ORAL | Status: DC | PRN

## 2014-04-23 MED ORDER — NITROGLYCERIN 0.4 MG SL SUBL
0.4000 mg | SUBLINGUAL_TABLET | SUBLINGUAL | Status: AC | PRN
  Administered 2014-04-23 – 2014-04-24 (×4): 0.4 mg via SUBLINGUAL
  Filled 2014-04-23 (×3): qty 1

## 2014-04-23 MED ORDER — SENNA 8.6 MG PO TABS
1.0000 | ORAL_TABLET | Freq: Two times a day (BID) | ORAL | Status: DC
  Administered 2014-04-23 – 2014-04-26 (×5): 8.6 mg via ORAL
  Filled 2014-04-23 (×10): qty 1

## 2014-04-23 MED ORDER — SODIUM CHLORIDE 0.9 % IJ SOLN: 3.0000 mL | INTRAMUSCULAR | Status: DC | PRN

## 2014-04-23 MED ORDER — ONDANSETRON HCL 4 MG PO TABS: 4.0000 mg | ORAL_TABLET | Freq: Four times a day (QID) | ORAL | Status: DC | PRN

## 2014-04-23 MED ORDER — KETOROLAC TROMETHAMINE 60 MG/2ML IM SOLN
60.0000 mg | Freq: Once | INTRAMUSCULAR | Status: DC
  Filled 2014-04-23: qty 2

## 2014-04-23 MED ORDER — INFLUENZA VAC SPLIT QUAD 0.5 ML IM SUSY
0.5000 mL | PREFILLED_SYRINGE | INTRAMUSCULAR | Status: AC
  Administered 2014-04-24: 0.5 mL via INTRAMUSCULAR
  Filled 2014-04-23: qty 0.5

## 2014-04-23 MED ORDER — PRAVASTATIN SODIUM 10 MG PO TABS
20.0000 mg | ORAL_TABLET | Freq: Every day | ORAL | Status: DC
  Administered 2014-04-23 – 2014-04-24 (×2): 20 mg via ORAL
  Filled 2014-04-23 (×2): qty 2
  Filled 2014-04-23: qty 1

## 2014-04-23 MED ORDER — SODIUM CHLORIDE 0.9 % IJ SOLN
3.0000 mL | Freq: Two times a day (BID) | INTRAMUSCULAR | Status: DC
Start: 2014-04-23 — End: 2014-04-27
  Administered 2014-04-23 – 2014-04-26 (×2): 3 mL via INTRAVENOUS

## 2014-04-23 MED ORDER — ASPIRIN 81 MG PO CHEW
243.0000 mg | CHEWABLE_TABLET | Freq: Once | ORAL | Status: AC
  Administered 2014-04-23: 243 mg via ORAL
  Filled 2014-04-23: qty 3

## 2014-04-23 MED ORDER — HEPARIN SODIUM (PORCINE) 5000 UNIT/ML IJ SOLN
5000.0000 [IU] | Freq: Three times a day (TID) | INTRAMUSCULAR | Status: DC
Start: 2014-04-23 — End: 2014-04-23

## 2014-04-23 MED ORDER — ACETAMINOPHEN 325 MG PO TABS
650.0000 mg | ORAL_TABLET | ORAL | Status: DC | PRN
  Administered 2014-04-23: 650 mg via ORAL
  Filled 2014-04-23: qty 2

## 2014-04-23 MED ORDER — SODIUM CHLORIDE 0.9 % IJ SOLN
3.0000 mL | Freq: Two times a day (BID) | INTRAMUSCULAR | Status: DC
  Administered 2014-04-23 – 2014-04-24 (×3): 3 mL via INTRAVENOUS

## 2014-04-23 MED ORDER — OXYCODONE-ACETAMINOPHEN 5-325 MG PO TABS
1.0000 | ORAL_TABLET | Freq: Once | ORAL | Status: AC
  Administered 2014-04-23: 1 via ORAL
  Filled 2014-04-23: qty 1

## 2014-04-23 MED ORDER — HEPARIN SODIUM (PORCINE) 5000 UNIT/ML IJ SOLN
5000.0000 [IU] | Freq: Three times a day (TID) | INTRAMUSCULAR | Status: DC
  Administered 2014-04-23 – 2014-04-25 (×6): 5000 [IU] via SUBCUTANEOUS
  Filled 2014-04-23 (×6): qty 1

## 2014-04-23 NOTE — ED Notes (Signed)
Chest pain, onset last night, with ,sob, N/Vm  And sweating

## 2014-04-23 NOTE — ED Notes (Signed)
Report given to Val, RN unit 300. Ready to receive patient.

## 2014-04-23 NOTE — ED Provider Notes (Signed)
CSN: [id number]     Arrival date & time 04/23/14  1323 History  This chart was scribed for Sharyon Cable, MD by Stephania Fragmin, ED Scribe. This patient was seen in room APA02/APA02 and the patient's care was started at 2:25 PM.   Chief Complaint  Patient presents with  . Chest Pain   Patient is a 48 y.o. female presenting with chest pain. The history is provided by the patient. No language interpreter was used.  Chest Pain Pain location:  Epigastric and L chest Pain quality: pressure   Pain radiates to:  Epigastrium Pain severity:  Severe Duration:  1 day Timing:  Intermittent Progression:  Waxing and waning Chronicity:  New Context: at rest   Associated symptoms: diaphoresis, shortness of breath and vomiting   Associated symptoms: no lower extremity edema and no syncope      HPI Comments: [name] is a 48 y.o. female who presents to the Emergency Department complaining of intermittent episodes of severe chest pain that began last night when patient woke up with left-sided chest pain. In the morning, she felt diaphoretic and lightheaded in the shower before vomiting twice. Patient also complains of a squeezing sensation underneath her breast. She states that she doesn't feel pain presently in the room, just discomfort; sitting up exacerbates her chest discomfort. Patient had a cardiac work-up, including a stress test, in the summer at [facility] with negative results. She had been referred there by her PCP, Dr. Pleas Koch, because she had been experiencing pitting pedal edema. Patient takes baby aspirin every day. She has a history of cholecystectomy and appendectomy, hyperlipidemia, as well as anxiety and depression controlled by medication. Patient's maternal grandmother had a history of cardiac problems. Patient denies syncope, although she felt near-syncope in the shower, and has had pedal edema. She also denies a history of blood clots, hypertension, DM, and frequent use of  NSAIDs.    PMH - hyperlipidemia Family history - postive for CAD in grandmother Past Medical History  Diagnosis Date  . PONV (postoperative nausea and vomiting)     pt states scope patch does well  . Anemia   . Depression   . Anxiety   . Headache(784.0)     has migraines - medication controls   Past Surgical History  Procedure Laterality Date  . Right rotator cuff      2002  . Neck fusion      2003  . Laparoscopic cholecystectomy      2004  . Right knee arthroscopy      2005  . Colon surgery      2008  . Appendectomy      2008  . Abdominal hysterectomy    . Breast reduction surgery  03/17/2011    Procedure: MAMMARY REDUCTION BILATERAL (BREAST);  Surgeon: Mary A Contogiannis;  Location: [place];  Service: Plastics;  Laterality: Bilateral;  . Colonoscopy N/A 07/13/2013    Procedure: COLONOSCOPY;  Surgeon: Rogene Houston, MD;  [facility];  Service: Endoscopy;  Laterality: N/A;  930   Family History  Problem Relation Age of Onset  . Colon cancer Neg Hx    History  Substance Use Topics  . Smoking status: Former Smoker    Types: Cigarettes  . Smokeless tobacco: Not on file  . Alcohol Use: Yes     Comment: Occasionally   OB History    No data available     Review of Systems  Constitutional: Positive for diaphoresis.  Respiratory: Positive for shortness of breath.   Cardiovascular: Positive for chest pain. Negative for leg swelling and syncope.  Gastrointestinal: Positive for vomiting.  Neurological: Positive for light-headedness. Negative for syncope.  All other systems reviewed and are negative.     Allergies  Review of patient's allergies indicates no known allergies.  Home Medications   Prior to Admission medications   Medication Sig Start Date End Date Taking? Authorizing Provider  sertraline (ZOLOFT) 100 MG tablet Take 150 mg by mouth every morning.     Historical Provider, MD   BP 128/62 mmHg  Pulse 84  Temp(Src)  98.1 F (36.7 C) (Oral)  Resp 20  Ht 5\' 8"  (1.727 m)  Wt 244 lb (110.678 kg)  BMI 37.11 kg/m2  SpO2 97% Physical Exam  Nursing note and vitals reviewed.    CONSTITUTIONAL: Well developed/well nourished HEAD: Normocephalic/atraumatic EYES: EOMI/PERRL ENMT: Mucous membranes moist NECK: supple no meningeal signs SPINE/BACK:entire spine nontender CV: S1/S2 noted, no murmurs/rubs/gallops noted LUNGS: Lungs are clear to auscultation bilaterally, no apparent distress ABDOMEN: soft, nontender, no rebound or guarding, bowel sounds noted throughout abdomen GU:no cva tenderness NEURO: Pt is awake/alert/appropriate, moves all extremitiesx4.  No facial droop.   EXTREMITIES: pulses normal/equal, full ROM. No calf tenderness or edema. SKIN: warm, color normal PSYCH: no abnormalities of mood noted, alert and oriented to situation    ED Course  Procedures  DIAGNOSTIC STUDIES: Oxygen Saturation is 97% on room air, normal by my interpretation.    COORDINATION OF CARE: 2:32 PM - Discussed treatment plan with pt at bedside which includes possible hospital stay and pt agreed to plan.  4:12 PM EKG abnormal but unclear if this is acute  Troponin is negative Her CP is resolving Given history/risk factors and abnormal EKG, will admit D/w Dr Marily Memos, will admit for chest pain r/o ACS Pt reports passing stress test at [facility] but no records available at this time and she reports she did not see a cardiologist at that time I have low suspicion for PE/Dissection at this time    Medications  nitroGLYCERIN (NITROSTAT) SL tablet 0.4 mg (0.4 mg Sublingual Given 04/23/14 1451)  aspirin chewable tablet 243 mg (243 mg Oral Given 04/23/14 1354)  oxyCODONE-acetaminophen (PERCOCET/ROXICET) 5-325 MG per tablet 1 tablet (1 tablet Oral Given 04/23/14 1504)    Labs Review Labs Reviewed  BASIC METABOLIC PANEL - Abnormal; Notable for the following:    Potassium 3.6 (*)    Glucose, Bld 132 (*)    All  other components within normal limits  CBC WITH DIFFERENTIAL  TROPONIN I    Imaging Review Dg Chest 2 View  04/23/2014   CLINICAL DATA:  Left chest pain, shortness of breath  EXAM: CHEST  2 VIEW  COMPARISON:  08/14/2009  FINDINGS: Lungs are clear.  No pleural effusion or pneumothorax.  The heart is normal in size.  Visualized osseous structures are within normal limits.  IMPRESSION: Normal chest radiographs.   Electronically Signed   By: Julian Hy M.D.   On: 04/23/2014 14:39     EKG Interpretation   Date/Time:  Monday April 23 2014 13:38:43 EST Ventricular Rate:  83 PR Interval:  130 QRS Duration: 88 QT Interval:  364 QTC Calculation: 428 R Axis:   3 Text Interpretation:  Sinus rhythm Low voltage, precordial leads  Nonspecific T abnormalities, anterior leads Abnormal ekg changes noted  from prior Confirmed by Christy Gentles  MD, Brogen Duell ([id number]) on 04/23/2014 1:45:13  PM      MDM  Final diagnoses:  Chest pain  Chest pain, rule out acute myocardial infarction     Nursing notes including past medical history and social history reviewed and considered in documentation xrays/imaging reviewed by myself and considered during evaluation Labs/vital reviewed myself and considered during evaluation   I personally performed the services described in this documentation, which was scribed in my presence. The recorded information has been reviewed and is accurate.      Sharyon Cable, MD 04/23/14 [phone]

## 2014-04-23 NOTE — ED Notes (Signed)
Patient c/o left sided chest pain intermittently since last night. Patient states pain bands around underneath breasts and is squeezing. +diaphoresis per patient. Patient took 1 81 mg ASA PTA, ordered 243 mg ASA chewable per protocol to make full dose.

## 2014-04-23 NOTE — H&P (Signed)
[facility] History and Physical  Jamilee Lafosse [place] BRA:[id number] DOB: May 07, 1966 DOA: 04/23/2014  Referring physician: Dr Christy Gentles - [facility] PCP: [name], MD   Chief Complaint: CP  HPI: [name] is a 48 y.o. female  Woke up last night w/ sharp L sided CP. Pain would last about 1 minute. Non reproducible w/ inspiration or exertion. Relaxing w/o benefit. Associated  W/ band-like tightness across chest. Associated w/ vomiting, diaphoresis. Non-raditating. 2 episodes of SOB w/ CP. Occurred around every 30 minutes. NItro at home w/ benefit. Increased sensations of indigestion lately w/ esophageal discomfort. Has not eaten anything of late that would typically worsen reflux.    Review of Systems:  Constitutional:  No weight loss, night sweats, Fevers, chills, fatigue.  HEENT:  No headaches, Difficulty swallowing,Tooth/dental problems,Sore throat,  No sneezing, itching, ear ache, nasal congestion, post nasal drip,  Cardio-vascular:  Per HPI GI:  Per HPI Resp:   No excess mucus, no productive cough, No non-productive cough, No coughing up of blood.No change in color of mucus.No wheezing.No chest wall deformity  Skin:  no rash or lesions.  GU:  no dysuria, change in color of urine, no urgency or frequency. No flank pain.  Musculoskeletal:  No joint pain or swelling. No decreased range of motion. No back pain.  Psych:  No change in mood or affect. No depression or anxiety. No memory loss.   Past Medical History  Diagnosis Date  . PONV (postoperative nausea and vomiting)     pt states scope patch does well  . Anemia   . Depression   . Anxiety   . Headache(784.0)     has migraines - medication controls   Past Surgical History  Procedure Laterality Date  . Right rotator cuff      2002  . Neck fusion      2003  . Laparoscopic cholecystectomy      2004  . Right knee arthroscopy      2005  . Colon surgery      2008  . Appendectomy      2008  . Abdominal  hysterectomy    . Breast reduction surgery  03/17/2011    Procedure: MAMMARY REDUCTION BILATERAL (BREAST);  Surgeon: Mary A Contogiannis;  Location: [place];  Service: Plastics;  Laterality: Bilateral;  . Colonoscopy N/A 07/13/2013    Procedure: COLONOSCOPY;  Surgeon: Rogene Houston, MD;  [facility];  Service: Endoscopy;  Laterality: N/A;  930   Social History:  reports that she has quit smoking. Her smoking use included Cigarettes. She smoked 0.00 packs per day. She does not have any smokeless tobacco history on file. She reports that she drinks alcohol. She reports that she does not use illicit drugs.  No Known Allergies  Family History  Problem Relation Age of Onset  . Colon cancer Neg Hx   . Diabetes Father      Prior to Admission medications   Medication Sig Start Date End Date Taking? Authorizing Provider  sertraline (ZOLOFT) 100 MG tablet Take 150 mg by mouth every morning.     Historical Provider, MD   Physical Exam: Filed Vitals:   04/23/14 1446 04/23/14 1500 04/23/14 1502 04/23/14 1600  BP: 102/65 110/67 104/56 96/71  Pulse: 97 101 87 81  Temp:      TempSrc:      Resp:  21 16 22   Height:      Weight:      SpO2: 96% 95%  96% 96%    Wt Readings from Last 3 Encounters:  04/23/14 110.678 kg (244 lb)  07/13/13 104.327 kg (230 lb)  03/12/11 104.781 kg (231 lb)    General:  Appears calm and comfortable Eyes:  PERRL, normal lids, irises & conjunctiva ENT:  grossly normal hearing, lips & tongue Neck:  no LAD, masses or thyromegaly Cardiovascular:  RRR, no m/r/g. No LE edema. Telemetry:  SR, no arrhythmias  Respiratory:  CTA bilaterally, no w/r/r. Normal respiratory effort. Abdomen:  soft, ntnd Skin:  no rash or induration seen on limited exam Musculoskeletal:  grossly normal tone BUE/BLE Psychiatric:  grossly normal mood and affect, speech fluent and appropriate Neurologic:  grossly non-focal.          Labs on Admission:  Basic  Metabolic Panel:  Recent Labs Lab 04/23/14 1345  NA 137  K 3.6*  CL 102  CO2 20  GLUCOSE 132*  BUN 10  CREATININE 0.79  CALCIUM 9.3   Liver Function Tests: No results for input(s): AST, ALT, ALKPHOS, BILITOT, PROT, ALBUMIN in the last 168 hours. No results for input(s): LIPASE, AMYLASE in the last 168 hours. No results for input(s): AMMONIA in the last 168 hours. CBC:  Recent Labs Lab 04/23/14 1345  WBC 9.9  NEUTROABS 7.4  HGB 13.3  HCT 40.0  MCV 93.0  PLT 232   Cardiac Enzymes:  Recent Labs Lab 04/23/14 1345  TROPONINI <0.30    BNP (last 3 results) No results for input(s): PROBNP in the last 8760 hours. CBG: No results for input(s): GLUCAP in the last 168 hours.  Radiological Exams on Admission: Dg Chest 2 View  04/23/2014   CLINICAL DATA:  Left chest pain, shortness of breath  EXAM: CHEST  2 VIEW  COMPARISON:  08/14/2009  FINDINGS: Lungs are clear.  No pleural effusion or pneumothorax.  The heart is normal in size.  Visualized osseous structures are within normal limits.  IMPRESSION: Normal chest radiographs.   Electronically Signed   By: Julian Hy M.D.   On: 04/23/2014 14:39    EKG: Independently reviewed. NSR, non-specific T-wave changes. No sign of acute ischemia  Assessment/Plan Active Problems:   Chest pain, rule out acute myocardial infarction   Hyperlipidemia   Depression   Hypokalemia  CP: Etiology not clear. Pt w/ enough risk factors and convinsing description of symptoms to warrant cardiac CP r/o. Mother w/ first heart attack at age 57. Reflux, anxiety, and MSK pain are all also likely. Troponin negative. EKG non-specific but no clear ischemia.  - Admit for obs - Tele - EKG in am - Cycle troponins - GI cocktail - continue ASA  HLD: - continue statin  Depression: at baseline - continue zoloft  Hypokalemia : 3.6 on admission. - monitor. BMET in am  Code Status: FULL DVT Prophylaxis: Hep Family Communication: Husband,  brother and sister-in-law Disposition Plan: pending workukp     Maddex Garlitz, [facility] [email or web address] Password [email or web address]

## 2014-04-24 ENCOUNTER — Observation Stay (HOSPITAL_COMMUNITY): Payer: 59

## 2014-04-24 ENCOUNTER — Encounter (HOSPITAL_COMMUNITY): Payer: Self-pay | Admitting: Adult Health

## 2014-04-24 DIAGNOSIS — R1013 Epigastric pain: Secondary | ICD-10-CM

## 2014-04-24 DIAGNOSIS — R945 Abnormal results of liver function studies: Secondary | ICD-10-CM

## 2014-04-24 DIAGNOSIS — K769 Liver disease, unspecified: Secondary | ICD-10-CM

## 2014-04-24 DIAGNOSIS — R110 Nausea: Secondary | ICD-10-CM

## 2014-04-24 DIAGNOSIS — R7989 Other specified abnormal findings of blood chemistry: Secondary | ICD-10-CM

## 2014-04-24 LAB — HEPATIC FUNCTION PANEL
ALBUMIN: 3.3 g/dL — AB (ref 3.5–5.2)
ALT: 117 U/L — ABNORMAL HIGH (ref 0–35)
AST: 120 U/L — ABNORMAL HIGH (ref 0–37)
Alkaline Phosphatase: 236 U/L — ABNORMAL HIGH (ref 39–117)
Bilirubin, Direct: <0.2 mg/dL (ref 0.0–0.3)
Total Bilirubin: 0.4 mg/dL (ref 0.3–1.2)
Total Protein: 6.9 g/dL (ref 6.0–8.3)

## 2014-04-24 LAB — TSH: TSH: 1.65 u[IU]/mL (ref 0.350–4.500)

## 2014-04-24 LAB — BASIC METABOLIC PANEL
Anion gap: 15 (ref 5–15)
BUN: 11 mg/dL (ref 6–23)
CALCIUM: 9.5 mg/dL (ref 8.4–10.5)
CO2: 24 meq/L (ref 19–32)
CREATININE: 0.81 mg/dL (ref 0.50–1.10)
Chloride: 101 mEq/L (ref 96–112)
GFR calc non Af Amer: 83 mL/min — ABNORMAL LOW (ref >90)
Glucose, Bld: 103 mg/dL — ABNORMAL HIGH (ref 70–99)
Potassium: 3.9 mEq/L (ref 3.7–5.3)
Sodium: 140 mEq/L (ref 137–147)

## 2014-04-24 LAB — LIPASE, BLOOD: LIPASE: 33 U/L (ref 11–59)

## 2014-04-24 LAB — MAGNESIUM: MAGNESIUM: 2.1 mg/dL (ref 1.5–2.5)

## 2014-04-24 LAB — CBC
HCT: 39.1 % (ref 36.0–46.0)
Hemoglobin: 13 g/dL (ref 12.0–15.0)
MCH: 31.3 pg (ref 26.0–34.0)
MCHC: 33.2 g/dL (ref 30.0–36.0)
MCV: 94 fL (ref 78.0–100.0)
PLATELETS: 232 10*3/uL (ref 150–400)
RBC: 4.16 MIL/uL (ref 3.87–5.11)
RDW: 13.6 % (ref 11.5–15.5)
WBC: 9.2 10*3/uL (ref 4.0–10.5)

## 2014-04-24 LAB — D-DIMER, QUANTITATIVE: D-Dimer, Quant: 2.74 ug/mL-FEU — ABNORMAL HIGH (ref 0.00–0.48)

## 2014-04-24 MED ORDER — ALPRAZOLAM 0.5 MG PO TABS
0.5000 mg | ORAL_TABLET | Freq: Three times a day (TID) | ORAL | Status: DC | PRN
  Administered 2014-04-24 – 2014-04-26 (×5): 0.5 mg via ORAL
  Filled 2014-04-24 (×5): qty 1

## 2014-04-24 MED ORDER — HYDROCODONE-ACETAMINOPHEN 5-325 MG PO TABS
1.0000 | ORAL_TABLET | ORAL | Status: DC | PRN
  Administered 2014-04-24 – 2014-04-27 (×8): 1 via ORAL
  Filled 2014-04-24 (×8): qty 1

## 2014-04-24 MED ORDER — PANTOPRAZOLE SODIUM 40 MG PO TBEC
40.0000 mg | DELAYED_RELEASE_TABLET | Freq: Every day | ORAL | Status: DC
  Administered 2014-04-24 – 2014-04-26 (×3): 40 mg via ORAL
  Filled 2014-04-24 (×4): qty 1

## 2014-04-24 MED ORDER — NITROGLYCERIN 0.4 MG SL SUBL
SUBLINGUAL_TABLET | SUBLINGUAL | Status: AC
  Filled 2014-04-24: qty 1

## 2014-04-24 MED ORDER — TECHNETIUM TC 99M DIETHYLENETRIAME-PENTAACETIC ACID
44.0000 | Freq: Once | INTRAVENOUS | Status: AC | PRN
  Administered 2014-04-24: 44 via INTRAVENOUS

## 2014-04-24 MED ORDER — SODIUM CHLORIDE 0.9 % IJ SOLN
INTRAMUSCULAR | Status: AC
  Administered 2014-04-24: 19:00:00
  Filled 2014-04-24: qty 30

## 2014-04-24 MED ORDER — MORPHINE SULFATE 2 MG/ML IJ SOLN
2.0000 mg | Freq: Once | INTRAMUSCULAR | Status: AC
  Administered 2014-04-24: 2 mg via INTRAVENOUS
  Filled 2014-04-24: qty 1

## 2014-04-24 MED ORDER — NITROGLYCERIN 0.4 MG SL SUBL
0.4000 mg | SUBLINGUAL_TABLET | SUBLINGUAL | Status: DC | PRN
  Administered 2014-04-24 (×2): 0.4 mg via SUBLINGUAL
  Filled 2014-04-24 (×2): qty 1

## 2014-04-24 MED ORDER — ASPIRIN 81 MG PO CHEW
81.0000 mg | CHEWABLE_TABLET | Freq: Every day | ORAL | Status: DC
  Administered 2014-04-24 – 2014-04-25 (×2): 81 mg via ORAL
  Filled 2014-04-24 (×2): qty 1

## 2014-04-24 MED ORDER — TECHNETIUM TO 99M ALBUMIN AGGREGATED: 6.0000 | Freq: Once | INTRAVENOUS | Status: AC | PRN

## 2014-04-24 MED ORDER — TECHNETIUM TO 99M ALBUMIN AGGREGATED
6.0000 | Freq: Once | INTRAVENOUS | Status: AC | PRN
  Administered 2014-04-24: 6 via INTRAVENOUS

## 2014-04-24 MED ORDER — TECHNETIUM TC 99M DIETHYLENETRIAME-PENTAACETIC ACID: 44.0000 | Freq: Once | INTRAVENOUS | Status: AC | PRN

## 2014-04-24 MED ORDER — POTASSIUM CHLORIDE CRYS ER 20 MEQ PO TBCR
40.0000 meq | EXTENDED_RELEASE_TABLET | Freq: Once | ORAL | Status: AC
  Administered 2014-04-24: 40 meq via ORAL
  Filled 2014-04-24: qty 2

## 2014-04-24 MED ORDER — POTASSIUM CHLORIDE IN NACL 20-0.9 MEQ/L-% IV SOLN
INTRAVENOUS | Status: DC
  Administered 2014-04-24 – 2014-04-27 (×5): via INTRAVENOUS

## 2014-04-24 NOTE — Progress Notes (Signed)
[facility] PROGRESS NOTE  Brynn Mulgrew Raisch DJM:[id number] DOB: 1966/03/28 DOA: 04/23/2014 PCP: [name], MD    Code Status:  Full code Family Communication: Family not available Disposition Plan: Discharge when clinically appropriate   Consultants:  Cardiology  Procedures:  None  Antibiotics:  None  HPI/Subjective: Delayed entry Patient was seen late morning/early afternoon. She had minimal chest discomfort, but still had some breathlessness, but no outright chest pain.   Objective: Filed Vitals:   04/24/14 1304  BP: 111/59  Pulse: 55  Temp: 97.7 F (36.5 C)  Resp: 20   oxygen saturation 95-97%.  Intake/Output Summary (Last 24 hours) at 04/24/14 1906 Last data filed at 04/24/14 1300  Gross per 24 hour  Intake    243 ml  Output      0 ml  Net    243 ml   Filed Weights   04/23/14 1332 04/23/14 1813  Weight: 110.678 kg (244 lb) 110.678 kg (244 lb)    Exam:   General:  48 year old woman in no acute distress.  Cardiovascular: S1, S2, with a soft systolic murmur.  Respiratory: Clear to auscultation bilaterally.  Abdomen: Positive bowel sounds, soft, nontender, nondistended.  Musculoskeletal/extremities: No pedal edema or calf tenderness.   Data Reviewed: Basic Metabolic Panel:  Recent Labs Lab 04/23/14 1345 04/24/14 0556 04/24/14 0730  NA 137 140  --   K 3.6* 3.9  --   CL 102 101  --   CO2 20 24  --   GLUCOSE 132* 103*  --   BUN 10 11  --   CREATININE 0.79 0.81  --   CALCIUM 9.3 9.5  --   MG  --   --  2.1   Liver Function Tests:  Recent Labs Lab 04/24/14 0639  AST 120*  ALT 117*  ALKPHOS 236*  BILITOT 0.4  PROT 6.9  ALBUMIN 3.3*    Recent Labs Lab 04/24/14 0639  LIPASE 33   No results for input(s): AMMONIA in the last 168 hours. CBC:  Recent Labs Lab 04/23/14 1345 04/24/14 0639  WBC 9.9 9.2  NEUTROABS 7.4  --   HGB 13.3 13.0  HCT 40.0 39.1  MCV 93.0 94.0  PLT 232 232   Cardiac Enzymes:  Recent  Labs Lab 04/23/14 1345 04/23/14 1720 04/23/14 1946 04/23/14 2246  TROPONINI <0.30 <0.30 <0.30 <0.30   BNP (last 3 results) No results for input(s): PROBNP in the last 8760 hours. CBG: No results for input(s): GLUCAP in the last 168 hours.  No results found for this or any previous visit (from the past 240 hour(s)).   Studies: Dg Chest 2 View  04/23/2014   CLINICAL DATA:  Left chest pain, shortness of breath  EXAM: CHEST  2 VIEW  COMPARISON:  08/14/2009  FINDINGS: Lungs are clear.  No pleural effusion or pneumothorax.  The heart is normal in size.  Visualized osseous structures are within normal limits.  IMPRESSION: Normal chest radiographs.   Electronically Signed   By: Julian Hy M.D.   On: 04/23/2014 14:39   US Abdomen Complete  04/24/2014   CLINICAL DATA:  Patient with diffuse abdominal pain.  EXAM: ULTRASOUND ABDOMEN COMPLETE  COMPARISON:  CT 03/30/2007  FINDINGS: Gallbladder: Surgically absent  Common bile duct: Diameter: 7 mm  Liver: Multiple hypoechoic lesions are demonstrated throughout the hepatic parenchyma. Reference lesion within the right hepatic lobe measures up to 3 cm. Additionally within the posterior right hepatic lobe there is a 3 x 3 cm homogeneously hyperechoic  lesion, compatible with hemangioma, visualized on prior CT 03/30/2007.  IVC: No abnormality visualized.  Pancreas: Visualized portion unremarkable.  Spleen: Normal size. Nonspecific 1 cm hyperechoic lesion along the peripheral margin of the spleen, potentially correlating with previously visualized cystic lesions on prior CT.  Right Kidney: Length: 11.3 cm. Echogenicity within normal limits. No mass or hydronephrosis visualized.  Left Kidney: Length: 11.8 cm. Echogenicity within normal limits. No mass or hydronephrosis visualized.  Abdominal aorta: No aneurysm visualized.  Other findings: None.  IMPRESSION: Multiple hypoechoic lesions throughout the hepatic parenchyma, concerning for hepatic metastatic  disease. Recommend correlation with CT.  Nonspecific focal hyperechoic lesion within the periphery of the spleen, potentially correlating with previously identified splenic cystic lesions.  These results will be called to the ordering clinician or representative by the Radiologist Assistant, and communication documented in the PACS or zVision Dashboard.   Electronically Signed   By: Lovey Newcomer M.D.   On: 04/24/2014 15:37    Scheduled Meds: . aspirin  81 mg Oral Daily  . heparin  5,000 Units Subcutaneous 3 times per day  . pantoprazole  40 mg Oral Daily  . senna  1 tablet Oral BID  . sertraline  150 mg Oral Daily  . sodium chloride  3 mL Intravenous Q12H  . sodium chloride  3 mL Intravenous Q12H   Continuous Infusions:   Assessment and plan: Active Problems:   Chest pain, rule out acute myocardial infarction   Hyperlipidemia   Depression   Hypokalemia   Elevated LFTs   1. Atypical chest pain.  D-dimer ordered this afternoon and was elevated. CT angiogram of the chest was ordered to evaluate for PE, but later found that the CT scanner was malfunctioning. Therefore, VQ scan was ordered. There is a delay in obtaining the nuclear medicine by radiology. She is undergoing VQ scan currently, at 7 PM. If positive, will start full dose Lovenox and keep the patient hospitalized. Protonix started empirically.  Hepatic lesions.  Cardiology consulted and ordered an ultrasound because for elevated LFTs. The ultrasound was notable for multiple hypoechoic lesions throughout the hepatic parenchyma, concerning for hepatic metastatic disease. (Results noted tonight and discussed with patient). GI consulted by cardiology. We'll order a viral hepatitis panel and AFP. She will likely need CT of the abdomen/pelvis tomorrow for further evaluation. We'll order to be done tomorrow.  Elevated LFTs. Possibly secondary to hepatic lesions. We'll hold statin. Will get follow-up LFTs tomorrow  morning.  Hypokalemia. Started on potassium chloride supplementation.  Depression. Continue Zoloft.  Time spent: 35 minutes.    [facility] Pager [phone]. If 7PM-7AM, please contact night-coverage at [email or web address], password [facility] 04/24/2014, 7:06 PM  LOS: 1 day

## 2014-04-24 NOTE — Progress Notes (Signed)
UR completed 

## 2014-04-24 NOTE — Progress Notes (Signed)
MD aware of results from abdominal ultrasound. Awaiting results from other testing. Will monitor

## 2014-04-24 NOTE — Consult Note (Signed)
CARDIOLOGY CONSULT NOTE   Patient ID: [name] MRN: [id number] DOB/AGE: 1965-12-06 48 y.o.  Admit Date: 04/23/2014 Referring Physician:PTH-Fisher MD Primary Physician: [name], MD Consulting Cardiologist: Kate Sable MD Primary Cardiologist: New Reason for Consultation: Left sided chest pain  Clinical Summary [name] is a 48 y.o.female with known history of anemia, GERD, Depression and anxiety, admitted with complaints of band-like chest pain. She states that yesterday she was awakened with sharp left sided chest pain coming and going. Upon rising from bed she had band-like pain that was unrelenting for 6 hours. Some trouble breathing but not distressful. Went to grocery store and was very tired with band-like pain. Denies dizziness, but had some low grade nausea. No diaphoresis.  She states she was admitted to [facility] in the last Spring, early Summer, for chest discomfort. Had GXT and Echocardiogram. I have requested records. She states that everything was normal.   On arrival to ER, BP 128/62, HR 84 bpm, O2 Sat 97%. Potassium 3.6, Glucose 132, CXR normal. Troponin negative X 2. CXR normal. EKG sinus bradycardia.  Treated with ASA, NTG X 2 and oxycodone. Relief was found.   She had recurrent chest pressure this am. Just mild pressure, but notices this when she takes in deep breath.   No Known Allergies  Medications Scheduled Medications: . aspirin  81 mg Oral Daily  . heparin  5,000 Units Subcutaneous 3 times per day  . Influenza vac split quadrivalent PF  0.5 mL Intramuscular Tomorrow-1000  . pantoprazole  40 mg Oral Daily  . pravastatin  20 mg Oral Daily  . senna  1 tablet Oral BID  . sertraline  150 mg Oral Daily  . sodium chloride  3 mL Intravenous Q12H  . sodium chloride  3 mL Intravenous Q12H      PRN Medications: sodium chloride, acetaminophen, gi cocktail, nitroGLYCERIN, ondansetron **OR** ondansetron (ZOFRAN) IV, polyethylene  glycol, sodium chloride   Past Medical History  Diagnosis Date  . PONV (postoperative nausea and vomiting)     pt states scope patch does well  . Anemia   . Depression   . Anxiety   . Headache(784.0)     has migraines - medication controls    Past Surgical History  Procedure Laterality Date  . Right rotator cuff      2002  . Neck fusion      2003  . Laparoscopic cholecystectomy      2004  . Right knee arthroscopy      2005  . Colon surgery      2008  . Appendectomy      2008  . Abdominal hysterectomy    . Breast reduction surgery  03/17/2011    Procedure: MAMMARY REDUCTION BILATERAL (BREAST);  Surgeon: Mary A Contogiannis;  Location: [place];  Service: Plastics;  Laterality: Bilateral;  . Colonoscopy N/A 07/13/2013    Procedure: COLONOSCOPY;  Surgeon: Rogene Houston, MD;  [facility];  Service: Endoscopy;  Laterality: N/A;  930    Family History  Problem Relation Age of Onset  . Colon cancer Neg Hx   . Diabetes Father   . Heart attack Mother 14    mulitple over lifetime    Social History [name] reports that she has quit smoking. Her smoking use included Cigarettes. She smoked 0.00 packs per day. She does not have any smokeless tobacco history on file. [name] reports that she drinks alcohol.  Review of Systems Complete  review of systems are found to be negative unless outlined in H&P above.  Physical Examination Blood pressure 120/63, pulse 62, temperature 98.2 F (36.8 C), temperature source Oral, resp. rate 20, height 5\' 8"  (1.727 m), weight 244 lb (110.678 kg), SpO2 96 %. No intake or output data in the 24 hours ending 04/24/14 0828  Telemetry: Sinus bradycardia in the 50's.   EHO:ZYYQMGN in no acute distress HEENT: Conjunctiva and lids normal, oropharynx clear with moist mucosa. Neck: Supple, no elevated JVP or carotid bruits, no thyromegaly. Lungs: Clear to auscultation, nonlabored breathing at rest. Cardiac: Regular  rate and rhythm, no S3 or significant systolic murmur, no pericardial rub. Abdomen: Soft, nontender, no hepatomegaly, bowel sounds present, no guarding or rebound. Extremities: No pitting edema, distal pulses 2+. Skin: Warm and dry. Musculoskeletal: No kyphosis. Neuropsychiatric: Alert and oriented x3, affect grossly appropriate.  Prior Cardiac Testing/Procedures GXT and Echo [facility] Spring/Summer 2015-Records requested). Lab Results  Basic Metabolic Panel:  Recent Labs Lab 04/23/14 1345 04/24/14 0556  NA 137 140  K 3.6* 3.9  CL 102 101  CO2 20 24  GLUCOSE 132* 103*  BUN 10 11  CREATININE 0.79 0.81  CALCIUM 9.3 9.5    Liver Function Tests:  Recent Labs Lab 04/24/14 0639  AST 120*  ALT 117*  ALKPHOS 236*  BILITOT 0.4  PROT 6.9  ALBUMIN 3.3*    CBC:  Recent Labs Lab 04/23/14 1345 04/24/14 0639  WBC 9.9 9.2  NEUTROABS 7.4  --   HGB 13.3 13.0  HCT 40.0 39.1  MCV 93.0 94.0  PLT 232 232    Cardiac Enzymes:  Recent Labs Lab 04/23/14 1345 04/23/14 1720 04/23/14 1946 04/23/14 2246  TROPONINI <0.30 <0.30 <0.30 <0.30    Radiology: Dg Chest 2 View  04/23/2014   CLINICAL DATA:  Left chest pain, shortness of breath  EXAM: CHEST  2 VIEW  COMPARISON:  08/14/2009  FINDINGS: Lungs are clear.  No pleural effusion or pneumothorax.  The heart is normal in size.  Visualized osseous structures are within normal limits.  IMPRESSION: Normal chest radiographs.   Electronically Signed   By: Julian Hy M.D.   On: 04/23/2014 14:39     ECG: Sinus bradycardia rate of 54 bpm   Impression and Recommendations  1. Chest Pain: Typical and atypical features, described as sharp and intermittent on the left side, with band-like pain lasting 6 hours, along with mild chest pressure and associated mild dyspnea. She was found to be mildly hypokalemic. I will give her potassium replacement this am. Check Mg level as she is on PPI.  EKG and Troponin argue against ACS, but  she was found to be bradycardic. I will check TSH. Consider OP stress NM study. Will review records from [facility] when available.   2. GERD:  She is on PPI. Can consider GI evaluation as OP if clinically indicated.   3. Anxiety and Depression: She states it is under control with medications.   Signed: Phill Myron. Robertine Kipper NP [place]  04/24/2014, 8:28 AM Co-Sign MD

## 2014-04-24 NOTE — Care Management Note (Addendum)
    Page 1 of 1   04/27/2014     2:50:15 PM CARE MANAGEMENT NOTE 04/27/2014  Patient:  [name], [name]   Account Number:  [id number]  Date Initiated:  04/24/2014  Documentation initiated by:  Theophilus Kinds  Subjective/Objective Assessment:   Pt admitted from home with CP. Pt lives with her husband and will return home at discharge. Pt is independent with ADL's.     Action/Plan:   No CM needs noted. Anticipate discharge within 24 hours.   Anticipated DC Date:  04/25/2014   Anticipated DC Plan:  [place]  CM consult      Choice offered to / List presented to:             Status of service:  Completed, signed off Medicare Important Message given?   (If response is "NO", the following Medicare IM given date fields will be blank) Date Medicare IM given:   Medicare IM given by:   Date Additional Medicare IM given:   Additional Medicare IM given by:    Discharge Disposition:  HOME/SELF CARE  Per UR Regulation:    If discussed at Long Length of Stay Meetings, dates discussed:    Comments:  04/27/14 [place], RN BSN Cm Pt discharging home today. no Cm needs noted.  04/24/14 [place], RN BSN CM

## 2014-04-25 ENCOUNTER — Observation Stay (HOSPITAL_COMMUNITY): Payer: 59

## 2014-04-25 ENCOUNTER — Encounter (HOSPITAL_COMMUNITY): Payer: Self-pay | Admitting: Radiology

## 2014-04-25 DIAGNOSIS — K7689 Other specified diseases of liver: Secondary | ICD-10-CM

## 2014-04-25 DIAGNOSIS — C801 Malignant (primary) neoplasm, unspecified: Secondary | ICD-10-CM

## 2014-04-25 DIAGNOSIS — C7951 Secondary malignant neoplasm of bone: Secondary | ICD-10-CM

## 2014-04-25 DIAGNOSIS — Z136 Encounter for screening for cardiovascular disorders: Secondary | ICD-10-CM

## 2014-04-25 DIAGNOSIS — C787 Secondary malignant neoplasm of liver and intrahepatic bile duct: Secondary | ICD-10-CM

## 2014-04-25 LAB — CBC
HCT: 40.9 % (ref 36.0–46.0)
HEMOGLOBIN: 12.9 g/dL (ref 12.0–15.0)
MCH: 30.6 pg (ref 26.0–34.0)
MCHC: 31.5 g/dL (ref 30.0–36.0)
MCV: 96.9 fL (ref 78.0–100.0)
Platelets: 227 10*3/uL (ref 150–400)
RBC: 4.22 MIL/uL (ref 3.87–5.11)
RDW: 13.6 % (ref 11.5–15.5)
WBC: 8.3 10*3/uL (ref 4.0–10.5)

## 2014-04-25 LAB — HEPATITIS PANEL, ACUTE
HCV Ab: NEGATIVE
HEP A IGM: NONREACTIVE
HEP B C IGM: NONREACTIVE
HEP B S AG: NEGATIVE

## 2014-04-25 LAB — COMPREHENSIVE METABOLIC PANEL
ALBUMIN: 3.5 g/dL (ref 3.5–5.2)
ALT: 112 U/L — AB (ref 0–35)
ANION GAP: 13 (ref 5–15)
AST: 105 U/L — ABNORMAL HIGH (ref 0–37)
Alkaline Phosphatase: 236 U/L — ABNORMAL HIGH (ref 39–117)
BUN: 10 mg/dL (ref 6–23)
CO2: 27 mEq/L (ref 19–32)
Calcium: 9.3 mg/dL (ref 8.4–10.5)
Chloride: 102 mEq/L (ref 96–112)
Creatinine, Ser: 0.86 mg/dL (ref 0.50–1.10)
GFR calc Af Amer: 90 mL/min — ABNORMAL LOW (ref >90)
GFR calc non Af Amer: 77 mL/min — ABNORMAL LOW (ref >90)
Glucose, Bld: 93 mg/dL (ref 70–99)
POTASSIUM: 4.2 meq/L (ref 3.7–5.3)
SODIUM: 142 meq/L (ref 137–147)
TOTAL PROTEIN: 7.1 g/dL (ref 6.0–8.3)
Total Bilirubin: 0.4 mg/dL (ref 0.3–1.2)

## 2014-04-25 MED ORDER — SODIUM CHLORIDE 0.9 % IJ SOLN
INTRAMUSCULAR | Status: AC
  Filled 2014-04-25: qty 400

## 2014-04-25 MED ORDER — SODIUM CHLORIDE 0.9 % IJ SOLN
INTRAMUSCULAR | Status: AC
  Filled 2014-04-25: qty 24

## 2014-04-25 MED ORDER — IOHEXOL 300 MG/ML  SOLN: 80.0000 mL | Freq: Once | INTRAMUSCULAR | Status: AC | PRN

## 2014-04-25 MED ORDER — IOHEXOL 300 MG/ML  SOLN
100.0000 mL | Freq: Once | INTRAMUSCULAR | Status: AC | PRN
  Administered 2014-04-25: 100 mL via INTRAVENOUS

## 2014-04-25 MED ORDER — IOHEXOL 300 MG/ML  SOLN
50.0000 mL | Freq: Once | INTRAMUSCULAR | Status: AC | PRN
  Administered 2014-04-25: 50 mL via ORAL

## 2014-04-25 NOTE — Consult Note (Signed)
Referring Provider: Dr. Thersa Salt, MD Primary Care Physician:  [name], MD Primary Gastroenterologist:  Dr. Laural Golden  Reason for Consultation:  Abnormal abdominopelvic CT revealing multiple lesions consistent with metastatic disease.  HPI:   Patient is 48 year old Caucasian female who was in usual state of felt until about 4 weeks ago when she developed pain under the right rib cage made worse with deep breathing and she also noted drop in her appetite. She was seen by her primary care physician Dr. Florene Route and felt to have musculoskeletal pain and advised to use ibuprofen. She felt somewhat better but the pain never went away completely. She was to follow-up with him next week. 3 days ago she noted pain at left anterior chest as well as laterally 2 days ago she noted a bandlike pain across upper abdomen she felt weak and she thought she would pass out while she was at a grocery's store. She was concerned that she may be having cardiac pain therefore came to emergency room. She was hospitalized. Troponin levels were normal. She was seen by Dr. Bronson Ing and an associates her pain was felt to be of GI origin since she was also noted to have elevated transaminases and alkaline phosphatase. D-dimer was elevated at 2.74 and she therefore had VQ scan and was low probability for PE. She underwent abdominopelvic CT with contrast earlier today and noted to have multiple abnormalities including multiple lesions throughout the liver consistent with metastatic disease. She also had 34 mm lesion in the right lobe unchanged from study of 2008 consistent with hemangioma. She also had lytic lesions involving the lumbar spine and pelvis. No adenopathy or adnexal abnormality noted. Patient states she's been having night sweats for the last 4-5 months and he thought she was having with some motor symptoms. She denies shortness of breath. She is noted frequent heartburn intermittent nausea and  she had 2 episodes of vomiting prior to admission. Her bowels been irregular. She has had diarrhea and at times stools been hard. She denies melena or rectal bleeding dysuria hematuria or vaginal bleeding. She gives history of headache which she's had for 10 years felt to be due to migraine. She also complains of pain in left lumbar region that she's had for more than one year and it has not been progressive. She has screening colonoscopy by me in March 2015 with removal of small polyp from splenic flexure which turned out to be sessile serrated polyp. Follow-up colonoscopy was recommended in 5 years. She has not lost any weight recently since her symptoms began.   Past Medical History  Diagnosis Date  . PONV (postoperative nausea and vomiting)     pt states scope patch does well  . Anemia   . Depression   . Anxiety   . Headache(784.0)     has migraines - medication controls    Past Surgical History  Procedure Laterality Date  . Right rotator cuff      2002  . Neck fusion      2003  . Laparoscopic cholecystectomy      2004  . Right knee arthroscopy      2005  .  resection of distal small bowel with appendectomy for removal of benign tumor which turned out to be lipoma. appendix normal       2008  .         Marland Kitchen Abdominal hysterectomy    . Breast reduction surgery  03/17/2011    Procedure: MAMMARY REDUCTION  BILATERAL (BREAST);  Surgeon: Mary A Contogiannis;  Location: [place];  Service: Plastics;  Laterality: Bilateral;  . Colonoscopy N/A 07/13/2013    Procedure: COLONOSCOPY;  Surgeon: Rogene Houston, MD;  [facility];  Service: Endoscopy;  Laterality: N/A;  930    Prior to Admission medications   Medication Sig Start Date End Date Taking? Authorizing Provider  pravastatin (PRAVACHOL) 20 MG tablet Take 20 mg by mouth daily. 03/27/14  Yes Historical Provider, MD  sertraline (ZOLOFT) 100 MG tablet Take 150 mg by mouth every morning.    Yes Historical  Provider, MD  sertraline (ZOLOFT) 100 MG tablet Take 150 mg by mouth daily.   Yes Historical Provider, MD  SUMAtriptan (IMITREX) 100 MG tablet Take 100 mg by mouth as needed. foir migraine 03/26/14   Historical Provider, MD    Current Facility-Administered Medications  Medication Dose Route Frequency Provider Last Rate Last Dose  . 0.9 %  sodium chloride infusion  250 mL Intravenous PRN Waldemar Dickens, MD      . 0.9 % NaCl with KCl 20 mEq/ L  infusion   Intravenous Continuous Rexene Alberts, MD 75 mL/hr at 04/25/14 1250    . acetaminophen (TYLENOL) tablet 650 mg  650 mg Oral Q4H PRN Waldemar Dickens, MD   650 mg at 04/23/14 2241  . ALPRAZolam Duanne Moron) tablet 0.5 mg  0.5 mg Oral TID PRN Rexene Alberts, MD   0.5 mg at 04/25/14 1421  . gi cocktail (Maalox,Lidocaine,Donnatal)  30 mL Oral QID PRN Waldemar Dickens, MD   30 mL at 04/24/14 1258  . HYDROcodone-acetaminophen (NORCO/VICODIN) 5-325 MG per tablet 1 tablet  1 tablet Oral Q4H PRN Rexene Alberts, MD   1 tablet at 04/25/14 1231  . iohexol (OMNIPAQUE) 300 MG/ML solution 80 mL  80 mL Intravenous Once PRN Medication Radiologist, MD      . nitroGLYCERIN (NITROSTAT) SL tablet 0.4 mg  0.4 mg Sublingual Q5 min PRN Herminio Commons, MD   0.4 mg at 04/24/14 1553  . ondansetron (ZOFRAN) tablet 4 mg  4 mg Oral Q6H PRN Waldemar Dickens, MD       Or  . ondansetron [facility]) injection 4 mg  4 mg Intravenous Q6H PRN Waldemar Dickens, MD      . pantoprazole (PROTONIX) EC tablet 40 mg  40 mg Oral Daily Rexene Alberts, MD   40 mg at 04/25/14 2297  . polyethylene glycol (MIRALAX / GLYCOLAX) packet 17 g  17 g Oral Daily PRN Waldemar Dickens, MD      . senna [facility]) tablet 8.6 mg  1 tablet Oral BID Waldemar Dickens, MD   8.6 mg at 04/25/14 9892  . sertraline (ZOLOFT) tablet 150 mg  150 mg Oral Daily Waldemar Dickens, MD   150 mg at 04/25/14 1194  . sodium chloride 0.9 % injection 3 mL  3 mL Intravenous Q12H Waldemar Dickens, MD   3 mL at 04/23/14 2237  . sodium chloride  0.9 % injection 3 mL  3 mL Intravenous Q12H Waldemar Dickens, MD   3 mL at 04/24/14 2046  . sodium chloride 0.9 % injection 3 mL  3 mL Intravenous PRN Waldemar Dickens, MD      . sodium chloride 0.9 % injection           . sodium chloride 0.9 % injection             Allergies as of 04/23/2014  . (  No Known Allergies)    Family History  Problem Relation Age of Onset  . Colon cancer Neg Hx   . Diabetes Father   . Heart attack Maternal Grandmother 30    multiple over lifetime.     family history significant for the following   first cousin died of brain tumor in her early 74s. Another first cousin has been treated for melanoma in his remission at age 18. Maternal aunt was treated for lymphoma when she was in her 48s and is in remission at 26. Paternal grandfather was diagnosed with lung cancer at 61 and died within few months. Maternal grandfather died of prostate cancer at age 50.  History   Social History  She is married. Her husband is at bedside. They have 2 children in good health. Daughter is 57 years old and son is 20. She smokes cigarettes for total of 3 years less than pack a day but quit several years ago. She drinks alcohol occasionally. 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 She is not presently working.      Review of Systems: See HPI, otherwise normal ROS  Physical Exam: Temp:  [97.5 F (36.4 C)-98.8 F (37.1 C)] 97.5 F (36.4 C) (12/16 1416) Pulse Rate:  [60-70] 70 (12/16 1416) Resp:  [19-20] 19 (12/16 1416) BP: (109-129)/(59-80) 114/62 mmHg (12/16 1416) SpO2:  [95 %-99 %] 95 % (12/16 1416)  Patient is alert and in no acute distress. Conjunctiva is pink. Sclerae nonicteric. Pupils are equal and reactive. Oropharyngeal mucosa is normal. No thyromegaly or lymphadenopathy noted. Breast examination deferred. Exam was just performed by Dr. Isaac Bliss was negative. Cardiac exam with regular rhythm  normal S1 and S2. No murmur or gallop noted. Lungs are clear to auscultation. Abdomen is full. Bowel sounds and normal. No rub or bruit noted. Mild tenderness noted in epigastrium and below the right costal margin. No organomegaly or masses. No peripheral edema clubbing or skin rash noted.   Lab Results:  Recent Labs  04/23/14 1345 04/24/14 0639 04/25/14 0618  WBC 9.9 9.2 8.3  HGB 13.3 13.0 12.9  HCT 40.0 39.1 40.9  PLT 232 232 227   BMET  Recent Labs  04/23/14 1345 04/24/14 0556 04/25/14 0618  NA 137 140 142  K 3.6* 3.9 4.2  CL 102 101 102  CO2 20 24 27   GLUCOSE 132* 103* 93  BUN 10 11 10   CREATININE 0.79 0.81 0.86  CALCIUM 9.3 9.5 9.3   LFT  Recent Labs  04/24/14 0639 04/25/14 0618  PROT 6.9 7.1  ALBUMIN 3.3* 3.5  AST 120* 105*  ALT 117* 112*  ALKPHOS 236* 236*  BILITOT 0.4 0.4  BILIDIR <0.2  --   IBILI NOT CALCULATED  --    PT/INR No results for input(s): LABPROT, INR in the last 72 hours. Hepatitis Panel  Recent Labs  04/25/14 0622  HEPBSAG NEGATIVE  HCVAB NEGATIVE  HEPAIGM NON REACTIVE  HEPBIGM NON REACTIVE    Studies/Results: Ct Abdomen Pelvis W Wo Contrast  04/25/2014   CLINICAL DATA:  Left-sided chest pain with elevated liver function studies and multiple liver lesions on ultrasound. No given history of malignancy. Initial encounter.  EXAM: CT ABDOMEN AND PELVIS WITHOUT AND WITH CONTRAST  TECHNIQUE: Multidetector CT imaging of the abdomen and pelvis was performed following the standard protocol before and following the bolus administration of intravenous contrast.  CONTRAST:  69mL OMNIPAQUE IOHEXOL 300 MG/ML SOLN,  154mL OMNIPAQUE IOHEXOL 300 MG/ML SOLN  COMPARISON:  Ultrasound 04/24/2014.  Abdominal pelvic CT 03/30/2007.  FINDINGS: Lower chest: Clear lung bases. No significant pleural or pericardial effusion.  Hepatobiliary: As demonstrated on ultrasound, there are innumerable new liver lesions consistent with metastatic disease. Representative  lesions within the medial segment of the left lobe include a 3.4 cm lesion on image 48 and a 2.8 cm lesion on image 55 of series 7. The low-density lesion posteriorly in the right hepatic lobe is unchanged from the prior CT and consistent with a hemangioma. This measures 3.7 cm on image 37 of series 7. There is no significant biliary dilatation status post cholecystectomy.  Pancreas: Unremarkable. No pancreatic ductal dilatation or surrounding inflammatory changes.  Spleen: The spleen remains normal in size and demonstrates no suspicious lesions. There is a knee 12 mm low-density lesion anteriorly which has slightly enlarged from the prior study, although is likely an incidental cyst. Small splenules are noted.  Adrenals/Urinary Tract: Both adrenal glands appear normal.The kidneys appear normal without evidence of urinary tract calculus or hydronephrosis. No bladder abnormalities are seen.  Stomach/Bowel: No evidence of bowel wall thickening, distention or surrounding inflammatory change.Retrocecal surgical clips suggest prior cholecystectomy. There is no extraluminal fluid collection.  Vascular/Lymphatic: There are no enlarged abdominal or pelvic lymph nodes. No significant vascular findings are present.  Reproductive: Status post partial hysterectomy. Both ovaries are visualized and appear normal.  Other: No evidence of abdominal wall mass or hernia.  Musculoskeletal: There are multiple new lytic lesions consistent with metastatic disease. The largest is in the left sacrum measuring 2.0 cm on image 123. There are multiple small lesions within the spine, largest at L4. There are small lesions within the proximal femurs. No epidural tumor or pathologic fracture demonstrated.  IMPRESSION: 1. Widespread metastatic disease to the liver. 2. Multiple lytic lesions are noted throughout the spine and pelvis consistent with metastatic disease. No pathologic fracture or epidural tumor demonstrated. 3. No primary malignancy  identified within the abdomen or pelvis. Consider breast primary. The liver lesions should be amenable to percutaneous biopsy.   Electronically Signed   By: Camie Patience M.D.   On: 04/25/2014 08:29   US Abdomen Complete  04/24/2014   CLINICAL DATA:  Patient with diffuse abdominal pain.  EXAM: ULTRASOUND ABDOMEN COMPLETE  COMPARISON:  CT 03/30/2007  FINDINGS: Gallbladder: Surgically absent  Common bile duct: Diameter: 7 mm  Liver: Multiple hypoechoic lesions are demonstrated throughout the hepatic parenchyma. Reference lesion within the right hepatic lobe measures up to 3 cm. Additionally within the posterior right hepatic lobe there is a 3 x 3 cm homogeneously hyperechoic lesion, compatible with hemangioma, visualized on prior CT 03/30/2007.  IVC: No abnormality visualized.  Pancreas: Visualized portion unremarkable.  Spleen: Normal size. Nonspecific 1 cm hyperechoic lesion along the peripheral margin of the spleen, potentially correlating with previously visualized cystic lesions on prior CT.  Right Kidney: Length: 11.3 cm. Echogenicity within normal limits. No mass or hydronephrosis visualized.  Left Kidney: Length: 11.8 cm. Echogenicity within normal limits. No mass or hydronephrosis visualized.  Abdominal aorta: No aneurysm visualized.  Other findings: None.  IMPRESSION: Multiple hypoechoic lesions throughout the hepatic parenchyma, concerning for hepatic metastatic disease. Recommend correlation with CT.  Nonspecific focal hyperechoic lesion within the periphery of the spleen, potentially correlating with previously identified splenic cystic lesions.  These results will be called to the ordering clinician or representative by the Radiologist Assistant, and communication documented in the PACS or zVision Dashboard.  Electronically Signed   By: Lovey Newcomer M.D.   On: 04/24/2014 15:37   Nm Pulmonary Perf And Vent  04/24/2014   CLINICAL DATA:  Chest pain  EXAM: NUCLEAR MEDICINE VENTILATION - PERFUSION LUNG  SCAN  Views: Anterior, posterior, left lateral, right lateral, RPO, LPO, RAO, LAO -ventilation and perfusion  Radionuclide: Technetium 43m DTPA -ventilation; Technetium 58m macroaggregated albumin-perfusion  Dose:  44.0 mCi-ventilation; 6.5 mCi-perfusion  Route of administration: Inhalation-ventilation; intravenous -perfusion  COMPARISON:  Chest radiograph April 23, 2014  FINDINGS: Ventilation: Radiotracer uptake is homogeneous and symmetric bilaterally. No ventilation defects are identified.  Perfusion: Radiotracer uptake is homogeneous and symmetric bilaterally. No perfusion defects are identified.  IMPRESSION: No ventilation or perfusion defects. Very low probability of pulmonary embolus.   Electronically Signed   By: Lowella Grip M.D.   On: 04/24/2014 19:44    Assessment;  Patient is 48 year old Caucasian female who presents with 4 week history of right upper quadrant pain associated with nausea and loss of appetite. She also experience chest pain but cardiac evaluation has been negative and on workup found to have multiple hepatic lesions consistent with metastatic disease he also has lesions in lumbar spine and pelvis. CT negative for adnexal masses or pancreatic lesion. Similarly there is no wall thickening to small bowel or colon. She underwent colonoscopy in March 2015 with removal of small sessile serrated adenoma. Chest and head CT are pending. Her present patient is consistent with metastatic disease. She does not appear to be primary in abdomen or pelvis. She did have segment of distal small bowel resected along with appendix revealing lipoma. Metastatic carcinoid remains in differential diagnosis along with other primaries. I do not feel she needs to undergo EGD at this time. I feel the next would be to to make a tissue diagnosis which will guide further evaluation and management.  Patient's condition reviewed at length with her sister and her husband and their questions  answered.   Recommendations;  Ultrasound or CT-guided liver biopsy to be performed at [facility]. I have contacted Dr. Charolotte Eke office and they have agreed to do biopsy tomorrow morning. Will hold aspirin and heparin. Further recommendations to follow.    LOS: 2 days   REHMAN,NAJEEB U  04/25/2014, 5:58 PM

## 2014-04-25 NOTE — Progress Notes (Signed)
[facility] PROGRESS NOTE  [name] UTM:[id number] DOB: [birth year] DOA: 04/23/2014 PCP: [name], MD  Assessment/Plan: Lesions to Liver/Pelvis/Spine -Very concerning for malignancy. -Had colonoscopy in 3/15 (1 sessile adenoma). -Has yearly mammograms. -Chest CT and CT Head will be ordered to look for primary. -Liver biopsy has been coordinated thru GI and will be performed tomorrow at [facility]. -Will request onc consultation. -Breast exam performed: no obvious mass altho has scar tissue from prior breast reduction surgery.  Chest Pain -Seen by cardiology. -Agree noncardiac in nature. -Normal myoview 6/15. -ECHO normal.  Code Status: Full Code Family Communication: Discussed with husband at bedside  Disposition Plan: For liver biopsy in am   Consultants:  GI  Cardiology   Antibiotics:  None   Subjective: Feels well. Anxious about test results  Objective: Filed Vitals:   04/24/14 1304 04/24/14 2146 04/25/14 0607 04/25/14 1416  BP: 111/59 109/59 129/80 114/62  Pulse: 55 70 60 70  Temp: 97.7 F (36.5 C) 98.8 F (37.1 C) 97.5 F (36.4 C) 97.5 F (36.4 C)  TempSrc: Oral Oral Oral Oral  Resp: 20 20 20 19   Height:      Weight:      SpO2: 97% 96% 99% 95%    Intake/Output Summary (Last 24 hours) at 04/25/14 1714 Last data filed at 04/25/14 0647  Gross per 24 hour  Intake  752.5 ml  Output      0 ml  Net  752.5 ml   Filed Weights   04/23/14 1332 04/23/14 1813  Weight: 110.678 kg (244 lb) 110.678 kg (244 lb)    Exam:   General:  AA Ox3  Cardiovascular: RRR  Respiratory: CTA B  Abdomen: S/NT/+BS  Extremities: no C/C/E   Neurologic:  Intact/non-focal  Data Reviewed: Basic Metabolic Panel:  Recent Labs Lab 04/23/14 1345 04/24/14 0556 04/24/14 0730 04/25/14 0618  NA 137 140  --  142  K 3.6* 3.9  --  4.2  CL 102 101  --  102  CO2 20 24  --  27  GLUCOSE 132* 103*  --  93  BUN 10 11  --  10  CREATININE 0.79 0.81  --   0.86  CALCIUM 9.3 9.5  --  9.3  MG  --   --  2.1  --    Liver Function Tests:  Recent Labs Lab 04/24/14 0639 04/25/14 0618  AST 120* 105*  ALT 117* 112*  ALKPHOS 236* 236*  BILITOT 0.4 0.4  PROT 6.9 7.1  ALBUMIN 3.3* 3.5    Recent Labs Lab 04/24/14 0639  LIPASE 33   No results for input(s): AMMONIA in the last 168 hours. CBC:  Recent Labs Lab 04/23/14 1345 04/24/14 0639 04/25/14 0618  WBC 9.9 9.2 8.3  NEUTROABS 7.4  --   --   HGB 13.3 13.0 12.9  HCT 40.0 39.1 40.9  MCV 93.0 94.0 96.9  PLT 232 232 227   Cardiac Enzymes:  Recent Labs Lab 04/23/14 1345 04/23/14 1720 04/23/14 1946 04/23/14 2246  TROPONINI <0.30 <0.30 <0.30 <0.30   BNP (last 3 results) No results for input(s): PROBNP in the last 8760 hours. CBG: No results for input(s): GLUCAP in the last 168 hours.  No results found for this or any previous visit (from the past 240 hour(s)).   Studies: Ct Abdomen Pelvis W Wo Contrast  04/25/2014   CLINICAL DATA:  Left-sided chest pain with elevated liver function studies and multiple liver lesions on ultrasound. No given history  of malignancy. Initial encounter.  EXAM: CT ABDOMEN AND PELVIS WITHOUT AND WITH CONTRAST  TECHNIQUE: Multidetector CT imaging of the abdomen and pelvis was performed following the standard protocol before and following the bolus administration of intravenous contrast.  CONTRAST:  41mL OMNIPAQUE IOHEXOL 300 MG/ML SOLN, 150mL OMNIPAQUE IOHEXOL 300 MG/ML SOLN  COMPARISON:  Ultrasound 04/24/2014.  Abdominal pelvic CT 03/30/2007.  FINDINGS: Lower chest: Clear lung bases. No significant pleural or pericardial effusion.  Hepatobiliary: As demonstrated on ultrasound, there are innumerable new liver lesions consistent with metastatic disease. Representative lesions within the medial segment of the left lobe include a 3.4 cm lesion on image 48 and a 2.8 cm lesion on image 55 of series 7. The low-density lesion posteriorly in the right hepatic lobe  is unchanged from the prior CT and consistent with a hemangioma. This measures 3.7 cm on image 37 of series 7. There is no significant biliary dilatation status post cholecystectomy.  Pancreas: Unremarkable. No pancreatic ductal dilatation or surrounding inflammatory changes.  Spleen: The spleen remains normal in size and demonstrates no suspicious lesions. There is a knee 12 mm low-density lesion anteriorly which has slightly enlarged from the prior study, although is likely an incidental cyst. Small splenules are noted.  Adrenals/Urinary Tract: Both adrenal glands appear normal.The kidneys appear normal without evidence of urinary tract calculus or hydronephrosis. No bladder abnormalities are seen.  Stomach/Bowel: No evidence of bowel wall thickening, distention or surrounding inflammatory change.Retrocecal surgical clips suggest prior cholecystectomy. There is no extraluminal fluid collection.  Vascular/Lymphatic: There are no enlarged abdominal or pelvic lymph nodes. No significant vascular findings are present.  Reproductive: Status post partial hysterectomy. Both ovaries are visualized and appear normal.  Other: No evidence of abdominal wall mass or hernia.  Musculoskeletal: There are multiple new lytic lesions consistent with metastatic disease. The largest is in the left sacrum measuring 2.0 cm on image 123. There are multiple small lesions within the spine, largest at L4. There are small lesions within the proximal femurs. No epidural tumor or pathologic fracture demonstrated.  IMPRESSION: 1. Widespread metastatic disease to the liver. 2. Multiple lytic lesions are noted throughout the spine and pelvis consistent with metastatic disease. No pathologic fracture or epidural tumor demonstrated. 3. No primary malignancy identified within the abdomen or pelvis. Consider breast primary. The liver lesions should be amenable to percutaneous biopsy.   Electronically Signed   By: Camie Patience M.D.   On: 04/25/2014  08:29   US Abdomen Complete  04/24/2014   CLINICAL DATA:  Patient with diffuse abdominal pain.  EXAM: ULTRASOUND ABDOMEN COMPLETE  COMPARISON:  CT 03/30/2007  FINDINGS: Gallbladder: Surgically absent  Common bile duct: Diameter: 7 mm  Liver: Multiple hypoechoic lesions are demonstrated throughout the hepatic parenchyma. Reference lesion within the right hepatic lobe measures up to 3 cm. Additionally within the posterior right hepatic lobe there is a 3 x 3 cm homogeneously hyperechoic lesion, compatible with hemangioma, visualized on prior CT 03/30/2007.  IVC: No abnormality visualized.  Pancreas: Visualized portion unremarkable.  Spleen: Normal size. Nonspecific 1 cm hyperechoic lesion along the peripheral margin of the spleen, potentially correlating with previously visualized cystic lesions on prior CT.  Right Kidney: Length: 11.3 cm. Echogenicity within normal limits. No mass or hydronephrosis visualized.  Left Kidney: Length: 11.8 cm. Echogenicity within normal limits. No mass or hydronephrosis visualized.  Abdominal aorta: No aneurysm visualized.  Other findings: None.  IMPRESSION: Multiple hypoechoic lesions throughout the hepatic parenchyma, concerning for hepatic metastatic disease. Recommend  correlation with CT.  Nonspecific focal hyperechoic lesion within the periphery of the spleen, potentially correlating with previously identified splenic cystic lesions.  These results will be called to the ordering clinician or representative by the Radiologist Assistant, and communication documented in the PACS or zVision Dashboard.   Electronically Signed   By: Lovey Newcomer M.D.   On: 04/24/2014 15:37   Nm Pulmonary Perf And Vent  04/24/2014   CLINICAL DATA:  Chest pain  EXAM: NUCLEAR MEDICINE VENTILATION - PERFUSION LUNG SCAN  Views: Anterior, posterior, left lateral, right lateral, RPO, LPO, RAO, LAO -ventilation and perfusion  Radionuclide: Technetium 16m DTPA -ventilation; Technetium 74m macroaggregated  albumin-perfusion  Dose:  44.0 mCi-ventilation; 6.5 mCi-perfusion  Route of administration: Inhalation-ventilation; intravenous -perfusion  COMPARISON:  Chest radiograph April 23, 2014  FINDINGS: Ventilation: Radiotracer uptake is homogeneous and symmetric bilaterally. No ventilation defects are identified.  Perfusion: Radiotracer uptake is homogeneous and symmetric bilaterally. No perfusion defects are identified.  IMPRESSION: No ventilation or perfusion defects. Very low probability of pulmonary embolus.   Electronically Signed   By: Lowella Grip M.D.   On: 04/24/2014 19:44    Scheduled Meds: . pantoprazole  40 mg Oral Daily  . senna  1 tablet Oral BID  . sertraline  150 mg Oral Daily  . sodium chloride  3 mL Intravenous Q12H  . sodium chloride  3 mL Intravenous Q12H  . sodium chloride      . sodium chloride       Continuous Infusions: . 0.9 % NaCl with KCl 20 mEq / L 75 mL/hr at 04/25/14 1250    Principal Problem:   Liver lesion Active Problems:   Chest pain, rule out acute myocardial infarction   Hyperlipidemia   Depression   Hypokalemia   Elevated LFTs    Time spent: 45 minutes. Greater than 50% of this time was spent in direct contact with the patient coordinating care.    Lelon Frohlich  [facility] Pager [phone]  If 7PM-7AM, please contact night-coverage at [email or web address], password [facility] 04/25/2014, 5:14 PM  LOS: 2 days

## 2014-04-25 NOTE — Progress Notes (Signed)
UR completed 

## 2014-04-25 NOTE — Progress Notes (Signed)
SUBJECTIVE: Pt with mild episode of chest pain earlier this morning, which spontaneously resolved. Continues to have nausea and abdominal discomfort. Was unaware of details of CT abdomen/pelvis results. Husband, Thayer Jew, also present.      Intake/Output Summary (Last 24 hours) at 04/25/14 1046 Last data filed at 04/25/14 0647  Gross per 24 hour  Intake  752.5 ml  Output      0 ml  Net  752.5 ml    Current Facility-Administered Medications  Medication Dose Route Frequency Provider Last Rate Last Dose  . 0.9 %  sodium chloride infusion  250 mL Intravenous PRN Waldemar Dickens, MD      . 0.9 % NaCl with KCl 20 mEq/ L  infusion   Intravenous Continuous Rexene Alberts, MD 75 mL/hr at 04/24/14 2045    . acetaminophen (TYLENOL) tablet 650 mg  650 mg Oral Q4H PRN Waldemar Dickens, MD   650 mg at 04/23/14 2241  . ALPRAZolam Duanne Moron) tablet 0.5 mg  0.5 mg Oral TID PRN Rexene Alberts, MD   0.5 mg at 04/24/14 2045  . aspirin chewable tablet 81 mg  81 mg Oral Daily Rexene Alberts, MD   81 mg at 04/25/14 1093  . gi cocktail (Maalox,Lidocaine,Donnatal)  30 mL Oral QID PRN Waldemar Dickens, MD   30 mL at 04/24/14 1258  . heparin injection 5,000 Units  5,000 Units Subcutaneous 3 times per day Waldemar Dickens, MD   5,000 Units at 04/25/14 0612  . HYDROcodone-acetaminophen (NORCO/VICODIN) 5-325 MG per tablet 1 tablet  1 tablet Oral Q4H PRN Rexene Alberts, MD   1 tablet at 04/24/14 2052  . nitroGLYCERIN (NITROSTAT) SL tablet 0.4 mg  0.4 mg Sublingual Q5 min PRN Herminio Commons, MD   0.4 mg at 04/24/14 1553  . ondansetron (ZOFRAN) tablet 4 mg  4 mg Oral Q6H PRN Waldemar Dickens, MD       Or  . ondansetron [facility]) injection 4 mg  4 mg Intravenous Q6H PRN Waldemar Dickens, MD      . pantoprazole (PROTONIX) EC tablet 40 mg  40 mg Oral Daily Rexene Alberts, MD   40 mg at 04/25/14 2355  . polyethylene glycol (MIRALAX / GLYCOLAX) packet 17 g  17 g Oral Daily PRN Waldemar Dickens, MD      . senna [facility])  tablet 8.6 mg  1 tablet Oral BID Waldemar Dickens, MD   8.6 mg at 04/25/14 7322  . sertraline (ZOLOFT) tablet 150 mg  150 mg Oral Daily Waldemar Dickens, MD   150 mg at 04/25/14 0254  . sodium chloride 0.9 % injection 3 mL  3 mL Intravenous Q12H Waldemar Dickens, MD   3 mL at 04/23/14 2237  . sodium chloride 0.9 % injection 3 mL  3 mL Intravenous Q12H Waldemar Dickens, MD   3 mL at 04/24/14 2046  . sodium chloride 0.9 % injection 3 mL  3 mL Intravenous PRN Waldemar Dickens, MD      . sodium chloride 0.9 % injection           . sodium chloride 0.9 % injection             Filed Vitals:   04/24/14 0953 04/24/14 1304 04/24/14 2146 04/25/14 0607  BP: 108/57 111/59 109/59 129/80  Pulse: 62 55 70 60  Temp:  97.7 F (36.5 C) 98.8 F (37.1 C) 97.5 F (36.4 C)  TempSrc:  Oral Oral Oral  Resp:  20 20 20   Height:      Weight:      SpO2:  97% 96% 99%    PHYSICAL EXAM General: NAD HEENT: Normal. Neck: No JVD, no thyromegaly.  Lungs: Clear to auscultation bilaterally with normal respiratory effort. CV: Nondisplaced PMI.  Regular rate and rhythm, normal S1/S2, no S3/S4, no murmur.  No pretibial edema.  No carotid bruit.  Normal pedal pulses.  Abdomen: Soft, +epigastric tenderness, no hepatosplenomegaly, no distention.  Neurologic: Alert and oriented x 3.  Psych: Normal affect. Musculoskeletal: Normal range of motion. No gross deformities. Extremities: No clubbing or cyanosis.     LABS: Basic Metabolic Panel:  Recent Labs  04/24/14 0556 04/24/14 0730 04/25/14 0618  NA 140  --  142  K 3.9  --  4.2  CL 101  --  102  CO2 24  --  27  GLUCOSE 103*  --  93  BUN 11  --  10  CREATININE 0.81  --  0.86  CALCIUM 9.5  --  9.3  MG  --  2.1  --    Liver Function Tests:  Recent Labs  04/24/14 0639 04/25/14 0618  AST 120* 105*  ALT 117* 112*  ALKPHOS 236* 236*  BILITOT 0.4 0.4  PROT 6.9 7.1  ALBUMIN 3.3* 3.5    Recent Labs  04/24/14 0639  LIPASE 33   CBC:  Recent Labs   04/23/14 1345 04/24/14 0639 04/25/14 0618  WBC 9.9 9.2 8.3  NEUTROABS 7.4  --   --   HGB 13.3 13.0 12.9  HCT 40.0 39.1 40.9  MCV 93.0 94.0 96.9  PLT 232 232 227   Cardiac Enzymes:  Recent Labs  04/23/14 1720 04/23/14 1946 04/23/14 2246  TROPONINI <0.30 <0.30 <0.30   BNP: Invalid input(s): POCBNP D-Dimer:  Recent Labs  04/24/14 1159  DDIMER 2.74*   Hemoglobin A1C: No results for input(s): HGBA1C in the last 72 hours. Fasting Lipid Panel: No results for input(s): CHOL, HDL, LDLCALC, TRIG, CHOLHDL, LDLDIRECT in the last 72 hours. Thyroid Function Tests:  Recent Labs  04/23/14 1345  TSH 1.650   Anemia Panel: No results for input(s): VITAMINB12, FOLATE, FERRITIN, TIBC, IRON, RETICCTPCT in the last 72 hours.  RADIOLOGY: Ct Abdomen Pelvis W Wo Contrast  04/25/2014   CLINICAL DATA:  Left-sided chest pain with elevated liver function studies and multiple liver lesions on ultrasound. No given history of malignancy. Initial encounter.  EXAM: CT ABDOMEN AND PELVIS WITHOUT AND WITH CONTRAST  TECHNIQUE: Multidetector CT imaging of the abdomen and pelvis was performed following the standard protocol before and following the bolus administration of intravenous contrast.  CONTRAST:  24mL OMNIPAQUE IOHEXOL 300 MG/ML SOLN, 170mL OMNIPAQUE IOHEXOL 300 MG/ML SOLN  COMPARISON:  Ultrasound 04/24/2014.  Abdominal pelvic CT 03/30/2007.  FINDINGS: Lower chest: Clear lung bases. No significant pleural or pericardial effusion.  Hepatobiliary: As demonstrated on ultrasound, there are innumerable new liver lesions consistent with metastatic disease. Representative lesions within the medial segment of the left lobe include a 3.4 cm lesion on image 48 and a 2.8 cm lesion on image 55 of series 7. The low-density lesion posteriorly in the right hepatic lobe is unchanged from the prior CT and consistent with a hemangioma. This measures 3.7 cm on image 37 of series 7. There is no significant biliary  dilatation status post cholecystectomy.  Pancreas: Unremarkable. No pancreatic ductal dilatation or surrounding inflammatory changes.  Spleen: The spleen remains normal in size  and demonstrates no suspicious lesions. There is a knee 12 mm low-density lesion anteriorly which has slightly enlarged from the prior study, although is likely an incidental cyst. Small splenules are noted.  Adrenals/Urinary Tract: Both adrenal glands appear normal.The kidneys appear normal without evidence of urinary tract calculus or hydronephrosis. No bladder abnormalities are seen.  Stomach/Bowel: No evidence of bowel wall thickening, distention or surrounding inflammatory change.Retrocecal surgical clips suggest prior cholecystectomy. There is no extraluminal fluid collection.  Vascular/Lymphatic: There are no enlarged abdominal or pelvic lymph nodes. No significant vascular findings are present.  Reproductive: Status post partial hysterectomy. Both ovaries are visualized and appear normal.  Other: No evidence of abdominal wall mass or hernia.  Musculoskeletal: There are multiple new lytic lesions consistent with metastatic disease. The largest is in the left sacrum measuring 2.0 cm on image 123. There are multiple small lesions within the spine, largest at L4. There are small lesions within the proximal femurs. No epidural tumor or pathologic fracture demonstrated.  IMPRESSION: 1. Widespread metastatic disease to the liver. 2. Multiple lytic lesions are noted throughout the spine and pelvis consistent with metastatic disease. No pathologic fracture or epidural tumor demonstrated. 3. No primary malignancy identified within the abdomen or pelvis. Consider breast primary. The liver lesions should be amenable to percutaneous biopsy.   Electronically Signed   By: Camie Patience M.D.   On: 04/25/2014 08:29   Dg Chest 2 View  04/23/2014   CLINICAL DATA:  Left chest pain, shortness of breath  EXAM: CHEST  2 VIEW  COMPARISON:  08/14/2009   FINDINGS: Lungs are clear.  No pleural effusion or pneumothorax.  The heart is normal in size.  Visualized osseous structures are within normal limits.  IMPRESSION: Normal chest radiographs.   Electronically Signed   By: Julian Hy M.D.   On: 04/23/2014 14:39   US Abdomen Complete  04/24/2014   CLINICAL DATA:  Patient with diffuse abdominal pain.  EXAM: ULTRASOUND ABDOMEN COMPLETE  COMPARISON:  CT 03/30/2007  FINDINGS: Gallbladder: Surgically absent  Common bile duct: Diameter: 7 mm  Liver: Multiple hypoechoic lesions are demonstrated throughout the hepatic parenchyma. Reference lesion within the right hepatic lobe measures up to 3 cm. Additionally within the posterior right hepatic lobe there is a 3 x 3 cm homogeneously hyperechoic lesion, compatible with hemangioma, visualized on prior CT 03/30/2007.  IVC: No abnormality visualized.  Pancreas: Visualized portion unremarkable.  Spleen: Normal size. Nonspecific 1 cm hyperechoic lesion along the peripheral margin of the spleen, potentially correlating with previously visualized cystic lesions on prior CT.  Right Kidney: Length: 11.3 cm. Echogenicity within normal limits. No mass or hydronephrosis visualized.  Left Kidney: Length: 11.8 cm. Echogenicity within normal limits. No mass or hydronephrosis visualized.  Abdominal aorta: No aneurysm visualized.  Other findings: None.  IMPRESSION: Multiple hypoechoic lesions throughout the hepatic parenchyma, concerning for hepatic metastatic disease. Recommend correlation with CT.  Nonspecific focal hyperechoic lesion within the periphery of the spleen, potentially correlating with previously identified splenic cystic lesions.  These results will be called to the ordering clinician or representative by the Radiologist Assistant, and communication documented in the PACS or zVision Dashboard.   Electronically Signed   By: Lovey Newcomer M.D.   On: 04/24/2014 15:37   Nm Pulmonary Perf And Vent  04/24/2014   CLINICAL  DATA:  Chest pain  EXAM: NUCLEAR MEDICINE VENTILATION - PERFUSION LUNG SCAN  Views: Anterior, posterior, left lateral, right lateral, RPO, LPO, RAO, LAO -ventilation and perfusion  Radionuclide: Technetium 58m DTPA -ventilation; Technetium 83m macroaggregated albumin-perfusion  Dose:  44.0 mCi-ventilation; 6.5 mCi-perfusion  Route of administration: Inhalation-ventilation; intravenous -perfusion  COMPARISON:  Chest radiograph April 23, 2014  FINDINGS: Ventilation: Radiotracer uptake is homogeneous and symmetric bilaterally. No ventilation defects are identified.  Perfusion: Radiotracer uptake is homogeneous and symmetric bilaterally. No perfusion defects are identified.  IMPRESSION: No ventilation or perfusion defects. Very low probability of pulmonary embolus.   Electronically Signed   By: Lowella Grip M.D.   On: 04/24/2014 19:44      ASSESSMENT AND PLAN: 1. Chest pain: Noncardiac, and likely referred from hepatic lesions. I reviewed cardiac testing results performed at [facility] earlier this year. Normal nuclear perfusion study on 11/01/13, EF 65%, with good exercise tolerance achieving 10.1 METS. Echocardiogram also normal with normal systolic and diastolic function, no valvular pathology, EF 60-65% (11/01/13). 2. Metastatic disease to liver and multiple lytic lesions in spine and pelvis: I discussed these findings with the patient and her husband, as they were unaware of these details. Denies any prior history of malignancy, infectious processes, and recent international travel. Has felt fatigued for past several months, with post-prandial nausea for past three weeks. Transaminases and alkaline phosphatase remain elevated. Hgb normal. Had benign fat necrosis by mammography on 08/08/12. I will repeat mammogram to search for primary malignancy. Had screening colonoscopy on 07/13/2013 which demonstrated a small polyp ablated via cold biopsy from splenic flexure (path showed sessile serrated  adenoma), and external hemorrhoids. I placed a GI consult on 04/24/14.  Dispo: No further recommendations at this time. From a cardiac standpoint, she is at low risk to undergo surgical procedures given normal echocardiogram and stress test earlier this year. Will sign off. Please call with questions.  Kate Sable, M.D., F.A.C.C.

## 2014-04-25 NOTE — Progress Notes (Signed)
Patient ID: [name], female   DOB: 06-19-1965, 48 y.o.   MRN: [id number]   Pt scheduled at [facility] 12/17 for liver lesion bx  To arrive [facility] via ambulance by 100 pm Will return to [facility] after procedure  See orders

## 2014-04-26 ENCOUNTER — Other Ambulatory Visit (HOSPITAL_COMMUNITY): Payer: 59

## 2014-04-26 ENCOUNTER — Observation Stay (HOSPITAL_COMMUNITY): Payer: 59

## 2014-04-26 DIAGNOSIS — K7689 Other specified diseases of liver: Secondary | ICD-10-CM

## 2014-04-26 DIAGNOSIS — C801 Malignant (primary) neoplasm, unspecified: Secondary | ICD-10-CM

## 2014-04-26 DIAGNOSIS — C787 Secondary malignant neoplasm of liver and intrahepatic bile duct: Secondary | ICD-10-CM

## 2014-04-26 LAB — PROTIME-INR
INR: 1 (ref 0.00–1.49)
Prothrombin Time: 13.3 seconds (ref 11.6–15.2)

## 2014-04-26 LAB — APTT: aPTT: 33 seconds (ref 24–37)

## 2014-04-26 LAB — AFP TUMOR MARKER: AFP-Tumor Marker: 3.1 ng/mL (ref <6.1)

## 2014-04-26 MED ORDER — IOHEXOL 300 MG/ML  SOLN
80.0000 mL | Freq: Once | INTRAMUSCULAR | Status: AC | PRN
  Administered 2014-04-26: 80 mL via INTRAVENOUS

## 2014-04-26 MED ORDER — ONDANSETRON HCL 4 MG PO TABS
8.0000 mg | ORAL_TABLET | Freq: Three times a day (TID) | ORAL | Status: DC | PRN
  Administered 2014-04-26: 8 mg via ORAL
  Filled 2014-04-26: qty 2

## 2014-04-26 NOTE — Progress Notes (Signed)
[facility] PROGRESS NOTE  [name] SPQ:[id number] DOB: 03-26-1966 DOA: 04/23/2014 PCP: [name], MD  Assessment/Plan: Lesions to Liver/Pelvis/Spine -Very concerning for malignancy. -Had colonoscopy in 3/15 (1 sessile adenoma). -Has yearly mammograms. -Chest CT and CT Head will be ordered to look for primary. -Liver biopsy has been coordinated thru GI and will be performed tomorrow at [facility] (had to be rescheduled due to emergent cases). -Appreciate onc consult, who will see in OP setting once tissue diagnosis obtained for discussion regarding prognosis and treatment plan. -Breast exam performed: no obvious mass altho has scar tissue from prior breast reduction surgery.  Chest Pain -Seen by cardiology. -Agree noncardiac in nature. -Normal myoview 6/15. -ECHO normal.  Code Status: Full Code Family Communication: Discussed with husband at bedside  Disposition Plan: For liver biopsy in am   Consultants:  GI  Cardiology   Antibiotics:  None   Subjective: Feels well. Remarkably uplift given current diagnosis.  Objective: Filed Vitals:   04/25/14 0607 04/25/14 1416 04/25/14 2125 04/26/14 0600  BP: 129/80 114/62 125/70 125/75  Pulse: 60 70 74 62  Temp: 97.5 F (36.4 C) 97.5 F (36.4 C) 98.4 F (36.9 C) 97.8 F (36.6 C)  TempSrc: Oral Oral Oral Oral  Resp: 20 19 20 20   Height:      Weight:      SpO2: 99% 95% 98% 99%    Intake/Output Summary (Last 24 hours) at 04/26/14 1254 Last data filed at 04/26/14 1141  Gross per 24 hour  Intake   1375 ml  Output    350 ml  Net   1025 ml   Filed Weights   04/23/14 1332 04/23/14 1813  Weight: 110.678 kg (244 lb) 110.678 kg (244 lb)    Exam:   General:  AA Ox3  Cardiovascular: RRR  Respiratory: CTA B  Abdomen: S/NT/+BS  Extremities: no C/C/E   Neurologic:  Intact/non-focal  Data Reviewed: Basic Metabolic Panel:  Recent Labs Lab 04/23/14 1345 04/24/14 0556 04/24/14 0730  04/25/14 0618  NA 137 140  --  142  K 3.6* 3.9  --  4.2  CL 102 101  --  102  CO2 20 24  --  27  GLUCOSE 132* 103*  --  93  BUN 10 11  --  10  CREATININE 0.79 0.81  --  0.86  CALCIUM 9.3 9.5  --  9.3  MG  --   --  2.1  --    Liver Function Tests:  Recent Labs Lab 04/24/14 0639 04/25/14 0618  AST 120* 105*  ALT 117* 112*  ALKPHOS 236* 236*  BILITOT 0.4 0.4  PROT 6.9 7.1  ALBUMIN 3.3* 3.5    Recent Labs Lab 04/24/14 0639  LIPASE 33   No results for input(s): AMMONIA in the last 168 hours. CBC:  Recent Labs Lab 04/23/14 1345 04/24/14 0639 04/25/14 0618  WBC 9.9 9.2 8.3  NEUTROABS 7.4  --   --   HGB 13.3 13.0 12.9  HCT 40.0 39.1 40.9  MCV 93.0 94.0 96.9  PLT 232 232 227   Cardiac Enzymes:  Recent Labs Lab 04/23/14 1345 04/23/14 1720 04/23/14 1946 04/23/14 2246  TROPONINI <0.30 <0.30 <0.30 <0.30   BNP (last 3 results) No results for input(s): PROBNP in the last 8760 hours. CBG: No results for input(s): GLUCAP in the last 168 hours.  No results found for this or any previous visit (from the past 240 hour(s)).   Studies: Ct Abdomen Pelvis W Wo  Contrast  04/25/2014   CLINICAL DATA:  Left-sided chest pain with elevated liver function studies and multiple liver lesions on ultrasound. No given history of malignancy. Initial encounter.  EXAM: CT ABDOMEN AND PELVIS WITHOUT AND WITH CONTRAST  TECHNIQUE: Multidetector CT imaging of the abdomen and pelvis was performed following the standard protocol before and following the bolus administration of intravenous contrast.  CONTRAST:  35mL OMNIPAQUE IOHEXOL 300 MG/ML SOLN, 132mL OMNIPAQUE IOHEXOL 300 MG/ML SOLN  COMPARISON:  Ultrasound 04/24/2014.  Abdominal pelvic CT 03/30/2007.  FINDINGS: Lower chest: Clear lung bases. No significant pleural or pericardial effusion.  Hepatobiliary: As demonstrated on ultrasound, there are innumerable new liver lesions consistent with metastatic disease. Representative lesions within  the medial segment of the left lobe include a 3.4 cm lesion on image 48 and a 2.8 cm lesion on image 55 of series 7. The low-density lesion posteriorly in the right hepatic lobe is unchanged from the prior CT and consistent with a hemangioma. This measures 3.7 cm on image 37 of series 7. There is no significant biliary dilatation status post cholecystectomy.  Pancreas: Unremarkable. No pancreatic ductal dilatation or surrounding inflammatory changes.  Spleen: The spleen remains normal in size and demonstrates no suspicious lesions. There is a knee 12 mm low-density lesion anteriorly which has slightly enlarged from the prior study, although is likely an incidental cyst. Small splenules are noted.  Adrenals/Urinary Tract: Both adrenal glands appear normal.The kidneys appear normal without evidence of urinary tract calculus or hydronephrosis. No bladder abnormalities are seen.  Stomach/Bowel: No evidence of bowel wall thickening, distention or surrounding inflammatory change.Retrocecal surgical clips suggest prior cholecystectomy. There is no extraluminal fluid collection.  Vascular/Lymphatic: There are no enlarged abdominal or pelvic lymph nodes. No significant vascular findings are present.  Reproductive: Status post partial hysterectomy. Both ovaries are visualized and appear normal.  Other: No evidence of abdominal wall mass or hernia.  Musculoskeletal: There are multiple new lytic lesions consistent with metastatic disease. The largest is in the left sacrum measuring 2.0 cm on image 123. There are multiple small lesions within the spine, largest at L4. There are small lesions within the proximal femurs. No epidural tumor or pathologic fracture demonstrated.  IMPRESSION: 1. Widespread metastatic disease to the liver. 2. Multiple lytic lesions are noted throughout the spine and pelvis consistent with metastatic disease. No pathologic fracture or epidural tumor demonstrated. 3. No primary malignancy identified  within the abdomen or pelvis. Consider breast primary. The liver lesions should be amenable to percutaneous biopsy.   Electronically Signed   By: Camie Patience M.D.   On: 04/25/2014 08:29   Ct Head W Wo Contrast  04/26/2014   CLINICAL DATA:  Malignancy. Evidence of widespread metastatic disease involving the liver and bones on recent imaging without a known primary malignancy.  EXAM: CT HEAD WITHOUT AND WITH CONTRAST  TECHNIQUE: Contiguous axial images were obtained from the base of the skull through the vertex without and with intravenous contrast  CONTRAST:  42mL OMNIPAQUE IOHEXOL 300 MG/ML  SOLN  COMPARISON:  Brain MRI 09/15/2008  FINDINGS: There is no evidence of acute cortical infarct, intracranial hemorrhage, mass, midline shift, or extra-axial fluid collection. Ventricles and sulci are normal. Gray-white differentiation is preserved. No abnormal enhancement is identified.  Prior bilateral ocular lens extractions are noted. No suspicious skull lesions are identified. The visualized paranasal sinuses and mastoid air cells are clear.  IMPRESSION: Unremarkable head CT.  No evidence of intracranial metastases.   Electronically Signed   By:  Logan Bores   On: 04/26/2014 11:36   Ct Chest W Contrast  04/26/2014   CLINICAL DATA:  Malignancy, chest pain starting 04/23/2014  EXAM: CT CHEST WITH CONTRAST  TECHNIQUE: Multidetector CT imaging of the chest was performed during intravenous contrast administration.  CONTRAST:  46mL OMNIPAQUE IOHEXOL 300 MG/ML  SOLN  COMPARISON:  CT scan abdomen and pelvis 04/25/2014  FINDINGS: Sagittal images of the thoracic spine shows lytic lesion in T8 vertebral body measures 6.5 mm. Lytic lesion in T11 vertebral body measures 7.6 mm. Findings are consistent with metastatic disease. No pathologic fractures are noted. There is lytic lesion right second rib measures 1.4 cm.  Images of the thoracic inlet are unremarkable. Central airways are patent. A precarinal lymph node measures 7  mm short-axis not pathologic. No hilar adenopathy.  No axillary adenopathy.  Images of the lung parenchyma shows no acute infiltrate or pulmonary edema. No pulmonary nodules are noted. No pulmonary mass is identified. Again noted innumerable target lesions within liver consistent with diffuse metastatic disease. Heart size within normal limits. No pericardial effusion.  IMPRESSION: 1. No lung mass or pulmonary nodules. 2. No mediastinal or hilar adenopathy. 3. Again noted diffuse metastatic hepatic disease. 4. Lytic bone lesions are noted consistent with metastatic disease. No destructive bony lesions or pathologic fractures.   Electronically Signed   By: Lahoma Crocker M.D.   On: 04/26/2014 12:04   US Abdomen Complete  04/24/2014   CLINICAL DATA:  Patient with diffuse abdominal pain.  EXAM: ULTRASOUND ABDOMEN COMPLETE  COMPARISON:  CT 03/30/2007  FINDINGS: Gallbladder: Surgically absent  Common bile duct: Diameter: 7 mm  Liver: Multiple hypoechoic lesions are demonstrated throughout the hepatic parenchyma. Reference lesion within the right hepatic lobe measures up to 3 cm. Additionally within the posterior right hepatic lobe there is a 3 x 3 cm homogeneously hyperechoic lesion, compatible with hemangioma, visualized on prior CT 03/30/2007.  IVC: No abnormality visualized.  Pancreas: Visualized portion unremarkable.  Spleen: Normal size. Nonspecific 1 cm hyperechoic lesion along the peripheral margin of the spleen, potentially correlating with previously visualized cystic lesions on prior CT.  Right Kidney: Length: 11.3 cm. Echogenicity within normal limits. No mass or hydronephrosis visualized.  Left Kidney: Length: 11.8 cm. Echogenicity within normal limits. No mass or hydronephrosis visualized.  Abdominal aorta: No aneurysm visualized.  Other findings: None.  IMPRESSION: Multiple hypoechoic lesions throughout the hepatic parenchyma, concerning for hepatic metastatic disease. Recommend correlation with CT.   Nonspecific focal hyperechoic lesion within the periphery of the spleen, potentially correlating with previously identified splenic cystic lesions.  These results will be called to the ordering clinician or representative by the Radiologist Assistant, and communication documented in the PACS or zVision Dashboard.   Electronically Signed   By: Lovey Newcomer M.D.   On: 04/24/2014 15:37   Nm Pulmonary Perf And Vent  04/24/2014   CLINICAL DATA:  Chest pain  EXAM: NUCLEAR MEDICINE VENTILATION - PERFUSION LUNG SCAN  Views: Anterior, posterior, left lateral, right lateral, RPO, LPO, RAO, LAO -ventilation and perfusion  Radionuclide: Technetium 54m DTPA -ventilation; Technetium 52m macroaggregated albumin-perfusion  Dose:  44.0 mCi-ventilation; 6.5 mCi-perfusion  Route of administration: Inhalation-ventilation; intravenous -perfusion  COMPARISON:  Chest radiograph April 23, 2014  FINDINGS: Ventilation: Radiotracer uptake is homogeneous and symmetric bilaterally. No ventilation defects are identified.  Perfusion: Radiotracer uptake is homogeneous and symmetric bilaterally. No perfusion defects are identified.  IMPRESSION: No ventilation or perfusion defects. Very low probability of pulmonary embolus.   Electronically  Signed   By: Lowella Grip M.D.   On: 04/24/2014 19:44    Scheduled Meds: . pantoprazole  40 mg Oral Daily  . senna  1 tablet Oral BID  . sertraline  150 mg Oral Daily  . sodium chloride  3 mL Intravenous Q12H  . sodium chloride  3 mL Intravenous Q12H   Continuous Infusions: . 0.9 % NaCl with KCl 20 mEq / L 75 mL/hr at 04/26/14 0234    Principal Problem:   Liver lesion Active Problems:   Chest pain, rule out acute myocardial infarction   Hyperlipidemia   Depression   Hypokalemia   Elevated LFTs    Time spent: 25 minutes. Greater than 50% of this time was spent in direct contact with the patient coordinating care.    Lelon Frohlich  [facility] Pager  [phone]  If 7PM-7AM, please contact night-coverage at [email or web address], password [facility] 04/26/2014, 12:54 PM  LOS: 3 days

## 2014-04-26 NOTE — Progress Notes (Signed)
  Subjective: Patient continues to have right upper quadrant pain but it is not as intense as before. She is getting relief with pain medication but she developed heartburn and regurgitation.    Objective: Blood pressure 125/75, pulse 62, temperature 97.8 F (36.6 C), temperature source Oral, resp. rate 20, height 5\' 8"  (1.727 m), weight 244 lb (110.678 kg), SpO2 99 %. Patient is sitting in recliner and appears to be in no acute distress.   Labs/studies Results:   Recent Labs  04/24/14 0639 04/25/14 0618  WBC 9.2 8.3  HGB 13.0 12.9  HCT 39.1 40.9  PLT 232 227    BMET   Recent Labs  04/24/14 0556 04/25/14 0618  NA 140 142  K 3.9 4.2  CL 101 102  CO2 24 27  GLUCOSE 103* 93  BUN 11 10  CREATININE 0.81 0.86  CALCIUM 9.5 9.3    LFT   Recent Labs  04/24/14 0639 04/25/14 0618  PROT 6.9 7.1  ALBUMIN 3.3* 3.5  AST 120* 105*  ALT 117* 112*  ALKPHOS 236* 236*  BILITOT 0.4 0.4  BILIDIR <0.2  --   IBILI NOT CALCULATED  --     PT/INR   Recent Labs  04/26/14 0545  LABPROT 13.3  INR 1.00    Hepatitis Panel   Recent Labs  04/25/14 0622  HEPBSAG NEGATIVE  HCVAB NEGATIVE  HEPAIGM NON REACTIVE  HEPBIGM NON REACTIVE    AFP is 3.1. Chest CT is negative for mediastinal adenopathy or lung masses. Head CT is negative.  Assessment:  #1. Multiple liver lesions suspicious for metastatic disease. She also has lesions in lumbar spine and pelvis. Liver biopsy could not be performed today and is rescheduled for tomorrow morning. No evidence on Lelan Pons or intracranial metastases. Oncology note appreciated. Chromogranin A and serotonin levels have been ordered along with one before a urine collection for 5HIAA. #2. GERD. Symptoms made worse with pain medication.   Recommendations;  Proceed with liver biopsy as planned.

## 2014-04-26 NOTE — Progress Notes (Signed)
UR completed 

## 2014-04-26 NOTE — Progress Notes (Signed)
Patient ID: [name], female   DOB: 02/20/1966, 48 y.o.   MRN: [id number]   Rescheduled Liver lesion biopsy to 12/18 am Secondary emergent cases added to [facility] schedule 12/17 RN aware---will inform pt  See new orders

## 2014-04-26 NOTE — Consult Note (Signed)
[facility] Consultation Oncology  Name: [name]      MRN: [id number]    Location: T622/Q333-54  Date: 04/26/2014 Time:9:22 AM   REFERRING PHYSICIAN:  Erline Hau, MD  REASON FOR CONSULT:  Metastatic disease, ?primary   DIAGNOSIS:  Liver lesions with lytic bone lesions, suspicious for malignancy.  HISTORY OF PRESENT ILLNESS:   [name] is a very pleasant 48 yo white female who has a past medical history significant for hyperlipidemia who presented to the [facility] ED on 04/23/2014 with chest pain.  Cardiac work-up is negative.  Airi reports that over the past 3 months or so she felt "ok" but not perfect.  She admits to early satiety without weight loss and an overall change in well being.  She admits to sweats and heart palpitation.    Due to her chest pain and risk factors, she was admitted to [facility] for observation.  Cardiology was consulted and work-up ruled out cardiac origin.    Work-up included an [place] of abdomen due to abdominal pain and this demonstrated multiple hypoechoic lesions throughout the hepatic parenchyma concerning for hepatic metastatic disease.  This lead to CT imaging that demonstrated widespread metastatic liver disease and multiple lytic lesions throughout spine and pelvis (without pathologic fractures).  Additionally, due to her chest pain, a V/Q scan was performed and there was a very low probability of PE.   Dr. Laural Golden discussed the case with me this AM and he reports that on 07/13/2013 she underwent a screening colonoscopy when she turned 48 years old and that was benign.  Polypectomy demonstrates a sessile polyp without dysplasia or malignancy.  He recommended TCS in 5 years (Marh 2020).   The patient reports that she lives in [place], [place].  She occassionally drinks EtOH socially.  She admits to a distant history of tobacco abuse that she used to smoke socially.  She is retired.  She was previously a Psychologist, sport and exercise with Teacher, adult education, and chiropractor.  She reports that she has 2 children, 50 yo female and 64 yo son.  Both are alive and healthy.  Her father has COPD and is 64 yo and her mkother is healthy and 50 yo.  She notes a family history of malignancy including her paternal GF with lung cancer, maternal GF with metastatic prostate cancer to bone, maternal aunt cured of lymphoma, a deceased cousin with brain tumor, and another cousin in remission with melanoma.    Other than nausea, she denies any oncology complaints and ROS questioning is negative.  PAST MEDICAL HISTORY:   Past Medical History  Diagnosis Date  . PONV (postoperative nausea and vomiting)     pt states scope patch does well  . Anemia   . Depression   . Anxiety   . Headache(784.0)     has migraines - medication controls    ALLERGIES: No Known Allergies    MEDICATIONS: I have reviewed the patient's current medications.     PAST SURGICAL HISTORY Past Surgical History  Procedure Laterality Date  . Right rotator cuff      2002  . Neck fusion      2003  . Laparoscopic cholecystectomy      2004  . Right knee arthroscopy      2005  . Colon surgery      2008  . Appendectomy      2008  . Abdominal hysterectomy    . Breast reduction surgery  03/17/2011  Procedure: MAMMARY REDUCTION BILATERAL (BREAST);  Surgeon: Mary A Contogiannis;  Location: [facility];  Service: Plastics;  Laterality: Bilateral;  . Colonoscopy N/A 07/13/2013    Procedure: COLONOSCOPY;  Surgeon: Rogene Houston, MD;  Location[facility];  Service: Endoscopy;  Laterality: N/A;  930    FAMILY HISTORY: Family History  Problem Relation Age of Onset  . Colon cancer Neg Hx   . Diabetes Father   . Heart attack Maternal Grandmother 30    multiple over lifetime.    SOCIAL HISTORY:  reports that she has quit smoking. Her smoking use included Cigarettes. She smoked 0.00 packs per day. She does not have any smokeless tobacco history on file. She  reports that she drinks alcohol. She reports that she does not use illicit drugs.  PERFORMANCE STATUS: The patient's performance status is 1 - Symptomatic but completely ambulatory  PHYSICAL EXAM: Most Recent Vital Signs: Blood pressure 125/75, pulse 62, temperature 97.8 F (36.6 C), temperature source Oral, resp. rate 20, height '5\' 8"'  (1.727 m), weight 244 lb (110.678 kg), SpO2 99 %. General appearance: alert, cooperative, appears stated age, no distress and moderately obese Head: Normocephalic, without obvious abnormality, atraumatic Eyes: negative findings: lids and lashes normal, conjunctivae and sclerae normal and corneas clear Skin: Skin color, texture, turgor normal. No rashes or lesions Neurologic: Alert and oriented X 3, normal strength and tone. Normal symmetric reflexes. Normal coordination and gait  LABORATORY DATA:  Results for orders placed or performed during the hospital encounter of 04/23/14 (from the past 48 hour(s))  D-dimer, quantitative     Status: Abnormal   Collection Time: 04/24/14 11:59 AM  Result Value Ref Range   D-Dimer, Quant 2.74 (H) 0.00 - 0.48 ug/mL-FEU    Comment:        AT THE INHOUSE ESTABLISHED CUTOFF VALUE OF 0.48 ug/mL FEU, THIS ASSAY HAS BEEN DOCUMENTED IN THE LITERATURE TO HAVE A SENSITIVITY AND NEGATIVE PREDICTIVE VALUE OF AT LEAST 98 TO 99%.  THE TEST RESULT SHOULD BE CORRELATED WITH AN ASSESSMENT OF THE CLINICAL PROBABILITY OF DVT / VTE.   Comprehensive metabolic panel     Status: Abnormal   Collection Time: 04/25/14  6:18 AM  Result Value Ref Range   Sodium 142 137 - 147 mEq/L   Potassium 4.2 3.7 - 5.3 mEq/L   Chloride 102 96 - 112 mEq/L   CO2 27 19 - 32 mEq/L   Glucose, Bld 93 70 - 99 mg/dL   BUN 10 6 - 23 mg/dL   Creatinine, Ser 0.86 0.50 - 1.10 mg/dL   Calcium 9.3 8.4 - 10.5 mg/dL   Total Protein 7.1 6.0 - 8.3 g/dL   Albumin 3.5 3.5 - 5.2 g/dL   AST 105 (H) 0 - 37 U/L   ALT 112 (H) 0 - 35 U/L   Alkaline Phosphatase 236 (H)  39 - 117 U/L   Total Bilirubin 0.4 0.3 - 1.2 mg/dL   GFR calc non Af Amer 77 (L) >90 mL/min   GFR calc Af Amer 90 (L) >90 mL/min    Comment: (NOTE) The eGFR has been calculated using the CKD EPI equation. This calculation has not been validated in all clinical situations. eGFR's persistently <90 mL/min signify possible Chronic Kidney Disease.    Anion gap 13 5 - 15  CBC     Status: None   Collection Time: 04/25/14  6:18 AM  Result Value Ref Range   WBC 8.3 4.0 - 10.5 K/uL  RBC 4.22 3.87 - 5.11 MIL/uL   Hemoglobin 12.9 12.0 - 15.0 g/dL   HCT 40.9 36.0 - 46.0 %   MCV 96.9 78.0 - 100.0 fL   MCH 30.6 26.0 - 34.0 pg   MCHC 31.5 30.0 - 36.0 g/dL   RDW 13.6 11.5 - 15.5 %   Platelets 227 150 - 400 K/uL  AFP tumor marker     Status: None   Collection Time: 04/25/14  6:22 AM  Result Value Ref Range   AFP-Tumor Marker 3.1 <6.1 ng/mL    Comment: (NOTE) Patients < 25 month old: * Pediatric range is based on full term neonates, values for premature infants may be higher. Female: The use of AFP as a Tumor Marker in pregnant patients is not recommended. This test was performed using the Beckman Coulter chemiluminescent method. Values obtained from different assay methods cannot be used interchangeably. AFP levels, regardless of value, should not be interpreted as absolute evidence of the presence or absence of disease. Performed at [facility]   Hepatitis panel, acute     Status: None   Collection Time: 04/25/14  6:22 AM  Result Value Ref Range   Hepatitis B Surface Ag NEGATIVE NEGATIVE   HCV Ab NEGATIVE NEGATIVE   Hep A IgM NON REACTIVE NON REACTIVE    Comment: (NOTE) Effective March 26, 2014, Hepatitis Acute Panel (test code [phone]) will be revised to automatically reflex to the Hepatitis C Viral RNA, Quantitative, Real-Time PCR assay if the Hepatitis C antibody screening result is Reactive. This action is being taken to ensure that the CDC/USPSTF recommended HCV  diagnostic algorithm with the appropriate test reflex needed for accurate interpretation is followed.    Hep B C IgM NON REACTIVE NON REACTIVE    Comment: (NOTE) High levels of Hepatitis B Core IgM antibody are detectable during the acute stage of Hepatitis B. This antibody is used to differentiate current from past HBV infection. Performed at [facility]     Status: None   Collection Time: 04/26/14  5:45 AM  Result Value Ref Range   Prothrombin Time 13.3 11.6 - 15.2 seconds   INR 1.00 0.00 - 1.49  APTT     Status: None   Collection Time: 04/26/14  5:45 AM  Result Value Ref Range   aPTT 33 24 - 37 seconds      RADIOGRAPHY: Ct Abdomen Pelvis W Wo Contrast  04/25/2014   CLINICAL DATA:  Left-sided chest pain with elevated liver function studies and multiple liver lesions on ultrasound. No given history of malignancy. Initial encounter.  EXAM: CT ABDOMEN AND PELVIS WITHOUT AND WITH CONTRAST  TECHNIQUE: Multidetector CT imaging of the abdomen and pelvis was performed following the standard protocol before and following the bolus administration of intravenous contrast.  CONTRAST:  39m OMNIPAQUE IOHEXOL 300 MG/ML SOLN, 1051mOMNIPAQUE IOHEXOL 300 MG/ML SOLN  COMPARISON:  Ultrasound 04/24/2014.  Abdominal pelvic CT 03/30/2007.  FINDINGS: Lower chest: Clear lung bases. No significant pleural or pericardial effusion.  Hepatobiliary: As demonstrated on ultrasound, there are innumerable new liver lesions consistent with metastatic disease. Representative lesions within the medial segment of the left lobe include a 3.4 cm lesion on image 48 and a 2.8 cm lesion on image 55 of series 7. The low-density lesion posteriorly in the right hepatic lobe is unchanged from the prior CT and consistent with a hemangioma. This measures 3.7 cm on image 37 of series 7. There is no significant biliary dilatation  status post cholecystectomy.  Pancreas: Unremarkable. No pancreatic ductal dilatation  or surrounding inflammatory changes.  Spleen: The spleen remains normal in size and demonstrates no suspicious lesions. There is a knee 12 mm low-density lesion anteriorly which has slightly enlarged from the prior study, although is likely an incidental cyst. Small splenules are noted.  Adrenals/Urinary Tract: Both adrenal glands appear normal.The kidneys appear normal without evidence of urinary tract calculus or hydronephrosis. No bladder abnormalities are seen.  Stomach/Bowel: No evidence of bowel wall thickening, distention or surrounding inflammatory change.Retrocecal surgical clips suggest prior cholecystectomy. There is no extraluminal fluid collection.  Vascular/Lymphatic: There are no enlarged abdominal or pelvic lymph nodes. No significant vascular findings are present.  Reproductive: Status post partial hysterectomy. Both ovaries are visualized and appear normal.  Other: No evidence of abdominal wall mass or hernia.  Musculoskeletal: There are multiple new lytic lesions consistent with metastatic disease. The largest is in the left sacrum measuring 2.0 cm on image 123. There are multiple small lesions within the spine, largest at L4. There are small lesions within the proximal femurs. No epidural tumor or pathologic fracture demonstrated.  IMPRESSION: 1. Widespread metastatic disease to the liver. 2. Multiple lytic lesions are noted throughout the spine and pelvis consistent with metastatic disease. No pathologic fracture or epidural tumor demonstrated. 3. No primary malignancy identified within the abdomen or pelvis. Consider breast primary. The liver lesions should be amenable to percutaneous biopsy.   Electronically Signed   By: Camie Patience M.D.   On: 04/25/2014 08:29   US Abdomen Complete  04/24/2014   CLINICAL DATA:  Patient with diffuse abdominal pain.  EXAM: ULTRASOUND ABDOMEN COMPLETE  COMPARISON:  CT 03/30/2007  FINDINGS: Gallbladder: Surgically absent  Common bile duct: Diameter: 7 mm   Liver: Multiple hypoechoic lesions are demonstrated throughout the hepatic parenchyma. Reference lesion within the right hepatic lobe measures up to 3 cm. Additionally within the posterior right hepatic lobe there is a 3 x 3 cm homogeneously hyperechoic lesion, compatible with hemangioma, visualized on prior CT 03/30/2007.  IVC: No abnormality visualized.  Pancreas: Visualized portion unremarkable.  Spleen: Normal size. Nonspecific 1 cm hyperechoic lesion along the peripheral margin of the spleen, potentially correlating with previously visualized cystic lesions on prior CT.  Right Kidney: Length: 11.3 cm. Echogenicity within normal limits. No mass or hydronephrosis visualized.  Left Kidney: Length: 11.8 cm. Echogenicity within normal limits. No mass or hydronephrosis visualized.  Abdominal aorta: No aneurysm visualized.  Other findings: None.  IMPRESSION: Multiple hypoechoic lesions throughout the hepatic parenchyma, concerning for hepatic metastatic disease. Recommend correlation with CT.  Nonspecific focal hyperechoic lesion within the periphery of the spleen, potentially correlating with previously identified splenic cystic lesions.  These results will be called to the ordering clinician or representative by the Radiologist Assistant, and communication documented in the PACS or zVision Dashboard.   Electronically Signed   By: Lovey Newcomer M.D.   On: 04/24/2014 15:37   Nm Pulmonary Perf And Vent  04/24/2014   CLINICAL DATA:  Chest pain  EXAM: NUCLEAR MEDICINE VENTILATION - PERFUSION LUNG SCAN  Views: Anterior, posterior, left lateral, right lateral, RPO, LPO, RAO, LAO -ventilation and perfusion  Radionuclide: Technetium 5mDTPA -ventilation; Technetium 964macroaggregated albumin-perfusion  Dose:  44.0 mCi-ventilation; 6.5 mCi-perfusion  Route of administration: Inhalation-ventilation; intravenous -perfusion  COMPARISON:  Chest radiograph April 23, 2014  FINDINGS: Ventilation: Radiotracer uptake is  homogeneous and symmetric bilaterally. No ventilation defects are identified.  Perfusion: Radiotracer uptake is homogeneous and  symmetric bilaterally. No perfusion defects are identified.  IMPRESSION: No ventilation or perfusion defects. Very low probability of pulmonary embolus.   Electronically Signed   By: Lowella Grip M.D.   On: 04/24/2014 19:44       PATHOLOGY:  None  ASSESSMENT:  1. Diffuse liver lesions with lytic bone lesions, suspicious for malignancy.   2. Transaminitis 3. Early satiety 4. Occasional nausea  Patient Active Problem List   Diagnosis Date Noted  . Elevated LFTs 04/24/2014  . Liver lesion 04/24/2014  . Chest pain, rule out acute myocardial infarction 04/23/2014  . Hyperlipidemia 04/23/2014  . Depression 04/23/2014  . Hypokalemia 04/23/2014  . Hypertrophy of breast 03/17/2011    PLAN:  1. I personally reviewed and went over laboratory results with the patient.  The results are noted within this dictation. 2. I personally reviewed and went over radiographic studies with the patient.  The results are noted within this dictation.   3. Chart is reviewed 4. I learned after seeing the patient that her IR liver biopsy was rescheduled to tomorrow (04/27/2014) 5. CT chest and head are ordered 6. Labs today: serum serotonin and chromogranin A, 24 hour urine collection for 5 HIAA 7. Long discussion with the patient today.  No discussion regarding staging, diagnosis, prognosis, or treatment options due to lack of tissue diagnosis.  This will be performed in the future as an outpatient.  Patient is planning a trip to [place], [place] from 12/22- 05/06/2014.  Therefore, we will see the patient as an outpatient the week of 05/07/2014 at the [facility].   All questions were answered. The patient knows to call the clinic with any problems, questions or concerns. We can certainly see the patient much sooner if necessary.  Patient and plan discussed with Dr.  Farrel Gobble and he is in agreement with the aforementioned.   KEFALAS,THOMAS  04/26/2014

## 2014-04-27 ENCOUNTER — Encounter (HOSPITAL_COMMUNITY): Payer: Self-pay | Admitting: Radiology

## 2014-04-27 ENCOUNTER — Ambulatory Visit (HOSPITAL_COMMUNITY)
Admission: EM | Admit: 2014-04-27 | Discharge: 2014-04-27 | Disposition: A | Discharge status: Home / Self Care | Payer: 59 | Source: Home / Self Care | Attending: Internal Medicine | Admitting: Internal Medicine

## 2014-04-27 MED ORDER — FENTANYL CITRATE 0.05 MG/ML IJ SOLN
INTRAMUSCULAR | Status: AC
  Filled 2014-04-27: qty 4

## 2014-04-27 MED ORDER — ONDANSETRON HCL 8 MG PO TABS: 8.0000 mg | ORAL_TABLET | Freq: Three times a day (TID) | ORAL | Status: DC | PRN

## 2014-04-27 MED ORDER — LIDOCAINE HCL (PF) 1 % IJ SOLN
INTRAMUSCULAR | Status: AC
  Filled 2014-04-27: qty 10

## 2014-04-27 MED ORDER — MIDAZOLAM HCL 2 MG/2ML IJ SOLN
INTRAMUSCULAR | Status: AC | PRN
  Administered 2014-04-27 (×2): 1 mg via INTRAVENOUS

## 2014-04-27 MED ORDER — HYDROCODONE-ACETAMINOPHEN 5-325 MG PO TABS: 1.0000 | ORAL_TABLET | ORAL | Status: DC | PRN

## 2014-04-27 MED ORDER — PANTOPRAZOLE SODIUM 40 MG PO TBEC: 40.0000 mg | DELAYED_RELEASE_TABLET | Freq: Every day | ORAL | Status: DC

## 2014-04-27 MED ORDER — MIDAZOLAM HCL 2 MG/2ML IJ SOLN
INTRAMUSCULAR | Status: AC
  Filled 2014-04-27: qty 4

## 2014-04-27 MED ORDER — ALPRAZOLAM 0.5 MG PO TABS: 0.5000 mg | ORAL_TABLET | Freq: Three times a day (TID) | ORAL | Status: DC | PRN

## 2014-04-27 MED ORDER — FENTANYL CITRATE 0.05 MG/ML IJ SOLN
INTRAMUSCULAR | Status: AC | PRN
  Administered 2014-04-27: 50 ug via INTRAVENOUS
  Administered 2014-04-27: 25 ug via INTRAVENOUS
  Administered 2014-04-27: 50 ug via INTRAVENOUS

## 2014-04-27 NOTE — H&P (Signed)
Chief Complaint: Chief Complaint  Patient presents with  . Chest Pain  RUQ pain Change in appetite  Referring Physician(s): Dr Laural Golden  History of Present Illness: [name] is a 48 y.o. female   Pt has had RUQ pain off and on for few months Developed chest pain and went to ED [facility] 04/23/14 Work up reveals liver lesions and bony mets Now scheduled for liver lesion biopsy  Past Medical History  Diagnosis Date  . PONV (postoperative nausea and vomiting)     pt states scope patch does well  . Anemia   . Depression   . Anxiety   . Headache(784.0)     has migraines - medication controls    Past Surgical History  Procedure Laterality Date  . Right rotator cuff      2002  . Neck fusion      2003  . Laparoscopic cholecystectomy      2004  . Right knee arthroscopy      2005  . Colon surgery      2008  . Appendectomy      2008  . Abdominal hysterectomy    . Breast reduction surgery  03/17/2011    Procedure: MAMMARY REDUCTION BILATERAL (BREAST);  Surgeon: Mary A Contogiannis;  Location: [facility];  Service: Plastics;  Laterality: Bilateral;  . Colonoscopy N/A 07/13/2013    Procedure: COLONOSCOPY;  Surgeon: Rogene Houston, MD;  Location: [facility];  Service: Endoscopy;  Laterality: N/A;  930    Allergies: Review of patient's allergies indicates no known allergies.  Medications: Prior to Admission medications   Medication Sig Start Date End Date Taking? Authorizing Provider  pravastatin (PRAVACHOL) 20 MG tablet Take 20 mg by mouth daily. 03/27/14  Yes Historical Provider, MD  sertraline (ZOLOFT) 100 MG tablet Take 150 mg by mouth every morning.    Yes Historical Provider, MD  sertraline (ZOLOFT) 100 MG tablet Take 150 mg by mouth daily.   Yes Historical Provider, MD  SUMAtriptan (IMITREX) 100 MG tablet Take 100 mg by mouth as needed. foir migraine 03/26/14   Historical Provider, MD    Family History  Problem Relation Age of Onset  . Colon  cancer Neg Hx   . Diabetes Father   . Heart attack Maternal Grandmother 30    multiple over lifetime.    History   Social History  . Marital Status: Married    Spouse Name: N/A    Number of Children: N/A  . Years of Education: N/A   Social History Main Topics  . Smoking status: Former Smoker    Types: Cigarettes  . Smokeless tobacco: None  . Alcohol Use: Yes     Comment: Occasionally  . Drug Use: No  . Sexual Activity: None   Other Topics Concern  . None   Social History Narrative    Review of Systems: A 12 point ROS discussed and pertinent positives are indicated in the HPI above.  All other systems are negative.  Review of Systems  Constitutional: Positive for appetite change and fatigue.  Respiratory: Negative for cough and shortness of breath.   Cardiovascular: Positive for chest pain.  Gastrointestinal: Positive for abdominal pain.  Genitourinary: Negative for difficulty urinating.  Musculoskeletal: Negative for back pain.  Psychiatric/Behavioral: Negative for behavioral problems and confusion.    Vital Signs: BP 138/55 mmHg  Pulse 65  Temp(Src) 98.2 F (36.8 C) (Oral)  Resp 16  Ht 5\' 8"  (1.727 m)  Wt 110.678  kg (244 lb)  BMI 37.11 kg/m2  SpO2 95%  Physical Exam  Constitutional: She is oriented to person, place, and time.  Cardiovascular: Normal rate, regular rhythm and normal heart sounds.   No murmur heard. Pulmonary/Chest: Effort normal and breath sounds normal. She has no wheezes.  Abdominal: Soft. Bowel sounds are normal. There is tenderness.  Musculoskeletal: Normal range of motion.  Neurological: She is alert and oriented to person, place, and time.  Skin: Skin is warm and dry.  Psychiatric: She has a normal mood and affect. Her behavior is normal. Judgment and thought content normal.    Imaging: Ct Abdomen Pelvis W Wo Contrast  04/25/2014   CLINICAL DATA:  Left-sided chest pain with elevated liver function studies and multiple liver  lesions on ultrasound. No given history of malignancy. Initial encounter.  EXAM: CT ABDOMEN AND PELVIS WITHOUT AND WITH CONTRAST  TECHNIQUE: Multidetector CT imaging of the abdomen and pelvis was performed following the standard protocol before and following the bolus administration of intravenous contrast.  CONTRAST:  22mL OMNIPAQUE IOHEXOL 300 MG/ML SOLN, 154mL OMNIPAQUE IOHEXOL 300 MG/ML SOLN  COMPARISON:  Ultrasound 04/24/2014.  Abdominal pelvic CT 03/30/2007.  FINDINGS: Lower chest: Clear lung bases. No significant pleural or pericardial effusion.  Hepatobiliary: As demonstrated on ultrasound, there are innumerable new liver lesions consistent with metastatic disease. Representative lesions within the medial segment of the left lobe include a 3.4 cm lesion on image 48 and a 2.8 cm lesion on image 55 of series 7. The low-density lesion posteriorly in the right hepatic lobe is unchanged from the prior CT and consistent with a hemangioma. This measures 3.7 cm on image 37 of series 7. There is no significant biliary dilatation status post cholecystectomy.  Pancreas: Unremarkable. No pancreatic ductal dilatation or surrounding inflammatory changes.  Spleen: The spleen remains normal in size and demonstrates no suspicious lesions. There is a knee 12 mm low-density lesion anteriorly which has slightly enlarged from the prior study, although is likely an incidental cyst. Small splenules are noted.  Adrenals/Urinary Tract: Both adrenal glands appear normal.The kidneys appear normal without evidence of urinary tract calculus or hydronephrosis. No bladder abnormalities are seen.  Stomach/Bowel: No evidence of bowel wall thickening, distention or surrounding inflammatory change.Retrocecal surgical clips suggest prior cholecystectomy. There is no extraluminal fluid collection.  Vascular/Lymphatic: There are no enlarged abdominal or pelvic lymph nodes. No significant vascular findings are present.  Reproductive: Status post  partial hysterectomy. Both ovaries are visualized and appear normal.  Other: No evidence of abdominal wall mass or hernia.  Musculoskeletal: There are multiple new lytic lesions consistent with metastatic disease. The largest is in the left sacrum measuring 2.0 cm on image 123. There are multiple small lesions within the spine, largest at L4. There are small lesions within the proximal femurs. No epidural tumor or pathologic fracture demonstrated.  IMPRESSION: 1. Widespread metastatic disease to the liver. 2. Multiple lytic lesions are noted throughout the spine and pelvis consistent with metastatic disease. No pathologic fracture or epidural tumor demonstrated. 3. No primary malignancy identified within the abdomen or pelvis. Consider breast primary. The liver lesions should be amenable to percutaneous biopsy.   Electronically Signed   By: Camie Patience M.D.   On: 04/25/2014 08:29   Dg Chest 2 View  04/23/2014   CLINICAL DATA:  Left chest pain, shortness of breath  EXAM: CHEST  2 VIEW  COMPARISON:  08/14/2009  FINDINGS: Lungs are clear.  No pleural effusion or pneumothorax.  The  heart is normal in size.  Visualized osseous structures are within normal limits.  IMPRESSION: Normal chest radiographs.   Electronically Signed   By: Julian Hy M.D.   On: 04/23/2014 14:39   Ct Head W Wo Contrast  04/26/2014   CLINICAL DATA:  Malignancy. Evidence of widespread metastatic disease involving the liver and bones on recent imaging without a known primary malignancy.  EXAM: CT HEAD WITHOUT AND WITH CONTRAST  TECHNIQUE: Contiguous axial images were obtained from the base of the skull through the vertex without and with intravenous contrast  CONTRAST:  68mL OMNIPAQUE IOHEXOL 300 MG/ML  SOLN  COMPARISON:  Brain MRI 09/15/2008  FINDINGS: There is no evidence of acute cortical infarct, intracranial hemorrhage, mass, midline shift, or extra-axial fluid collection. Ventricles and sulci are normal. Gray-white  differentiation is preserved. No abnormal enhancement is identified.  Prior bilateral ocular lens extractions are noted. No suspicious skull lesions are identified. The visualized paranasal sinuses and mastoid air cells are clear.  IMPRESSION: Unremarkable head CT.  No evidence of intracranial metastases.   Electronically Signed   By: Logan Bores   On: 04/26/2014 11:36   Ct Chest W Contrast  04/26/2014   CLINICAL DATA:  Malignancy, chest pain starting 04/23/2014  EXAM: CT CHEST WITH CONTRAST  TECHNIQUE: Multidetector CT imaging of the chest was performed during intravenous contrast administration.  CONTRAST:  53mL OMNIPAQUE IOHEXOL 300 MG/ML  SOLN  COMPARISON:  CT scan abdomen and pelvis 04/25/2014  FINDINGS: Sagittal images of the thoracic spine shows lytic lesion in T8 vertebral body measures 6.5 mm. Lytic lesion in T11 vertebral body measures 7.6 mm. Findings are consistent with metastatic disease. No pathologic fractures are noted. There is lytic lesion right second rib measures 1.4 cm.  Images of the thoracic inlet are unremarkable. Central airways are patent. A precarinal lymph node measures 7 mm short-axis not pathologic. No hilar adenopathy.  No axillary adenopathy.  Images of the lung parenchyma shows no acute infiltrate or pulmonary edema. No pulmonary nodules are noted. No pulmonary mass is identified. Again noted innumerable target lesions within liver consistent with diffuse metastatic disease. Heart size within normal limits. No pericardial effusion.  IMPRESSION: 1. No lung mass or pulmonary nodules. 2. No mediastinal or hilar adenopathy. 3. Again noted diffuse metastatic hepatic disease. 4. Lytic bone lesions are noted consistent with metastatic disease. No destructive bony lesions or pathologic fractures.   Electronically Signed   By: Lahoma Crocker M.D.   On: 04/26/2014 12:04   US Abdomen Complete  04/24/2014   CLINICAL DATA:  Patient with diffuse abdominal pain.  EXAM: ULTRASOUND ABDOMEN  COMPLETE  COMPARISON:  CT 03/30/2007  FINDINGS: Gallbladder: Surgically absent  Common bile duct: Diameter: 7 mm  Liver: Multiple hypoechoic lesions are demonstrated throughout the hepatic parenchyma. Reference lesion within the right hepatic lobe measures up to 3 cm. Additionally within the posterior right hepatic lobe there is a 3 x 3 cm homogeneously hyperechoic lesion, compatible with hemangioma, visualized on prior CT 03/30/2007.  IVC: No abnormality visualized.  Pancreas: Visualized portion unremarkable.  Spleen: Normal size. Nonspecific 1 cm hyperechoic lesion along the peripheral margin of the spleen, potentially correlating with previously visualized cystic lesions on prior CT.  Right Kidney: Length: 11.3 cm. Echogenicity within normal limits. No mass or hydronephrosis visualized.  Left Kidney: Length: 11.8 cm. Echogenicity within normal limits. No mass or hydronephrosis visualized.  Abdominal aorta: No aneurysm visualized.  Other findings: None.  IMPRESSION: Multiple hypoechoic lesions throughout the hepatic  parenchyma, concerning for hepatic metastatic disease. Recommend correlation with CT.  Nonspecific focal hyperechoic lesion within the periphery of the spleen, potentially correlating with previously identified splenic cystic lesions.  These results will be called to the ordering clinician or representative by the Radiologist Assistant, and communication documented in the PACS or zVision Dashboard.   Electronically Signed   By: Lovey Newcomer M.D.   On: 04/24/2014 15:37   Nm Pulmonary Perf And Vent  04/24/2014   CLINICAL DATA:  Chest pain  EXAM: NUCLEAR MEDICINE VENTILATION - PERFUSION LUNG SCAN  Views: Anterior, posterior, left lateral, right lateral, RPO, LPO, RAO, LAO -ventilation and perfusion  Radionuclide: Technetium 53m DTPA -ventilation; Technetium 61m macroaggregated albumin-perfusion  Dose:  44.0 mCi-ventilation; 6.5 mCi-perfusion  Route of administration: Inhalation-ventilation; intravenous  -perfusion  COMPARISON:  Chest radiograph April 23, 2014  FINDINGS: Ventilation: Radiotracer uptake is homogeneous and symmetric bilaterally. No ventilation defects are identified.  Perfusion: Radiotracer uptake is homogeneous and symmetric bilaterally. No perfusion defects are identified.  IMPRESSION: No ventilation or perfusion defects. Very low probability of pulmonary embolus.   Electronically Signed   By: Lowella Grip M.D.   On: 04/24/2014 19:44    Labs:  CBC:  Recent Labs  04/23/14 1345 04/24/14 0639 04/25/14 0618  WBC 9.9 9.2 8.3  HGB 13.3 13.0 12.9  HCT 40.0 39.1 40.9  PLT 232 232 227    COAGS:  Recent Labs  04/26/14 0545  INR 1.00  APTT 33    BMP:  Recent Labs  04/23/14 1345 04/24/14 0556 04/25/14 0618  NA 137 140 142  K 3.6* 3.9 4.2  CL 102 101 102  CO2 20 24 27   GLUCOSE 132* 103* 93  BUN 10 11 10   CALCIUM 9.3 9.5 9.3  CREATININE 0.79 0.81 0.86  GFRNONAA >90 83* 77*  GFRAA >90 >90 90*    LIVER FUNCTION TESTS:  Recent Labs  04/24/14 0639 04/25/14 0618  BILITOT 0.4 0.4  AST 120* 105*  ALT 117* 112*  ALKPHOS 236* 236*  PROT 6.9 7.1  ALBUMIN 3.3* 3.5    TUMOR MARKERS:  Recent Labs  04/25/14 0622  AFPTM 3.1    Assessment and Plan:  Liver lesions and bony metastasis seen on CT Now scheduled for liver lesion biopsy Pt aware of procedure benefits and risks and agreeable to proceed Consent signed andin chart  Thank you for this interesting consult.  I greatly enjoyed meeting Jayni H Blauvelt and look forward to participating in their care.     I spent a total of 40 minutes face to face in clinical consultation, greater than 50% of which was counseling/coordinating care for liver lesion bx  Signed: Treana Lacour A 04/27/2014, 10:54 AM

## 2014-04-27 NOTE — Sedation Documentation (Signed)
Bandaid to mid abdomen- successful liver bx. Will notify carelink to transport back to [facility]

## 2014-04-27 NOTE — Discharge Summary (Signed)
Physician Discharge Summary  [name] TDS:[id number] DOB: 03/31/1966 DOA: 04/23/2014  PCP: [name], MD  Admit date: 04/23/2014 Discharge date: 04/27/2014  Time spent: 45 minutes  Recommendations for Outpatient Follow-up:  -Will be discharged home today if stable after her liver biopsy. -To follow up with oncology as scheduled after Christmas holiday.   Discharge Diagnoses:  Principal Problem:   Liver lesion Active Problems:   Chest pain, rule out acute myocardial infarction   Hyperlipidemia   Depression   Hypokalemia   Elevated LFTs   Malignancy   Discharge Condition: Stable and improved  Filed Weights   04/23/14 1332 04/23/14 1813  Weight: 110.678 kg (244 lb) 110.678 kg (244 lb)    History of present illness:  [name] is a 48 y.o. female  Woke up last night w/ sharp L sided CP. Pain would last about 1 minute. Non reproducible w/ inspiration or exertion. Relaxing w/o benefit. Associated W/ band-like tightness across chest. Associated w/ vomiting, diaphoresis. Non-raditating. 2 episodes of SOB w/ CP. Occurred around every 30 minutes. NItro at home w/ benefit. Increased sensations of indigestion lately w/ esophageal discomfort. Has not eaten anything of late that would typically worsen reflux.   Hospital Course:   Lesions to Liver/Pelvis/Spine -Very concerning for malignancy. -Had colonoscopy in 3/15 (1 sessile adenoma). -Has yearly mammograms. -Chest CT and CT Head will be ordered to look for primary. -Liver biopsy has been coordinated thru GI and has been performed today at [facility] (had to be rescheduled due to emergent cases). -Appreciate onc consult, who will see in OP setting once tissue diagnosis obtained for discussion regarding prognosis and treatment plan. -Breast exam performed: no obvious mass altho has scar tissue from prior breast reduction surgery. -Can DC later today if stable post biopsy.  Chest Pain -Seen by cardiology. -Agree  noncardiac in nature. -Normal myoview 6/15. -ECHO normal.  Procedures:  Liver biopsy 12/18    Consultations:  Cardiology  GI  Discharge Instructions     Medication List    ASK your doctor about these medications        pravastatin 20 MG tablet  Commonly known as:  PRAVACHOL  Take 20 mg by mouth daily.     sertraline 100 MG tablet  Commonly known as:  ZOLOFT  Take 150 mg by mouth daily.     sertraline 100 MG tablet  Commonly known as:  ZOLOFT  Take 150 mg by mouth every morning.     SUMAtriptan 100 MG tablet  Commonly known as:  IMITREX  Take 100 mg by mouth as needed. foir migraine       No Known Allergies    The results of significant diagnostics from this hospitalization (including imaging, microbiology, ancillary and laboratory) are listed below for reference.    Significant Diagnostic Studies: Ct Abdomen Pelvis W Wo Contrast  04/25/2014   CLINICAL DATA:  Left-sided chest pain with elevated liver function studies and multiple liver lesions on ultrasound. No given history of malignancy. Initial encounter.  EXAM: CT ABDOMEN AND PELVIS WITHOUT AND WITH CONTRAST  TECHNIQUE: Multidetector CT imaging of the abdomen and pelvis was performed following the standard protocol before and following the bolus administration of intravenous contrast.  CONTRAST:  44mL OMNIPAQUE IOHEXOL 300 MG/ML SOLN, 174mL OMNIPAQUE IOHEXOL 300 MG/ML SOLN  COMPARISON:  Ultrasound 04/24/2014.  Abdominal pelvic CT 03/30/2007.  FINDINGS: Lower chest: Clear lung bases. No significant pleural or pericardial effusion.  Hepatobiliary: As demonstrated on ultrasound, there are  innumerable new liver lesions consistent with metastatic disease. Representative lesions within the medial segment of the left lobe include a 3.4 cm lesion on image 48 and a 2.8 cm lesion on image 55 of series 7. The low-density lesion posteriorly in the right hepatic lobe is unchanged from the prior CT and consistent with a  hemangioma. This measures 3.7 cm on image 37 of series 7. There is no significant biliary dilatation status post cholecystectomy.  Pancreas: Unremarkable. No pancreatic ductal dilatation or surrounding inflammatory changes.  Spleen: The spleen remains normal in size and demonstrates no suspicious lesions. There is a knee 12 mm low-density lesion anteriorly which has slightly enlarged from the prior study, although is likely an incidental cyst. Small splenules are noted.  Adrenals/Urinary Tract: Both adrenal glands appear normal.The kidneys appear normal without evidence of urinary tract calculus or hydronephrosis. No bladder abnormalities are seen.  Stomach/Bowel: No evidence of bowel wall thickening, distention or surrounding inflammatory change.Retrocecal surgical clips suggest prior cholecystectomy. There is no extraluminal fluid collection.  Vascular/Lymphatic: There are no enlarged abdominal or pelvic lymph nodes. No significant vascular findings are present.  Reproductive: Status post partial hysterectomy. Both ovaries are visualized and appear normal.  Other: No evidence of abdominal wall mass or hernia.  Musculoskeletal: There are multiple new lytic lesions consistent with metastatic disease. The largest is in the left sacrum measuring 2.0 cm on image 123. There are multiple small lesions within the spine, largest at L4. There are small lesions within the proximal femurs. No epidural tumor or pathologic fracture demonstrated.  IMPRESSION: 1. Widespread metastatic disease to the liver. 2. Multiple lytic lesions are noted throughout the spine and pelvis consistent with metastatic disease. No pathologic fracture or epidural tumor demonstrated. 3. No primary malignancy identified within the abdomen or pelvis. Consider breast primary. The liver lesions should be amenable to percutaneous biopsy.   Electronically Signed   By: Camie Patience M.D.   On: 04/25/2014 08:29   Dg Chest 2 View  04/23/2014   CLINICAL  DATA:  Left chest pain, shortness of breath  EXAM: CHEST  2 VIEW  COMPARISON:  08/14/2009  FINDINGS: Lungs are clear.  No pleural effusion or pneumothorax.  The heart is normal in size.  Visualized osseous structures are within normal limits.  IMPRESSION: Normal chest radiographs.   Electronically Signed   By: Julian Hy M.D.   On: 04/23/2014 14:39   Ct Head W Wo Contrast  04/26/2014   CLINICAL DATA:  Malignancy. Evidence of widespread metastatic disease involving the liver and bones on recent imaging without a known primary malignancy.  EXAM: CT HEAD WITHOUT AND WITH CONTRAST  TECHNIQUE: Contiguous axial images were obtained from the base of the skull through the vertex without and with intravenous contrast  CONTRAST:  61mL OMNIPAQUE IOHEXOL 300 MG/ML  SOLN  COMPARISON:  Brain MRI 09/15/2008  FINDINGS: There is no evidence of acute cortical infarct, intracranial hemorrhage, mass, midline shift, or extra-axial fluid collection. Ventricles and sulci are normal. Gray-white differentiation is preserved. No abnormal enhancement is identified.  Prior bilateral ocular lens extractions are noted. No suspicious skull lesions are identified. The visualized paranasal sinuses and mastoid air cells are clear.  IMPRESSION: Unremarkable head CT.  No evidence of intracranial metastases.   Electronically Signed   By: Logan Bores   On: 04/26/2014 11:36   Ct Chest W Contrast  04/26/2014   CLINICAL DATA:  Malignancy, chest pain starting 04/23/2014  EXAM: CT CHEST WITH CONTRAST  TECHNIQUE:  Multidetector CT imaging of the chest was performed during intravenous contrast administration.  CONTRAST:  31mL OMNIPAQUE IOHEXOL 300 MG/ML  SOLN  COMPARISON:  CT scan abdomen and pelvis 04/25/2014  FINDINGS: Sagittal images of the thoracic spine shows lytic lesion in T8 vertebral body measures 6.5 mm. Lytic lesion in T11 vertebral body measures 7.6 mm. Findings are consistent with metastatic disease. No pathologic fractures are  noted. There is lytic lesion right second rib measures 1.4 cm.  Images of the thoracic inlet are unremarkable. Central airways are patent. A precarinal lymph node measures 7 mm short-axis not pathologic. No hilar adenopathy.  No axillary adenopathy.  Images of the lung parenchyma shows no acute infiltrate or pulmonary edema. No pulmonary nodules are noted. No pulmonary mass is identified. Again noted innumerable target lesions within liver consistent with diffuse metastatic disease. Heart size within normal limits. No pericardial effusion.  IMPRESSION: 1. No lung mass or pulmonary nodules. 2. No mediastinal or hilar adenopathy. 3. Again noted diffuse metastatic hepatic disease. 4. Lytic bone lesions are noted consistent with metastatic disease. No destructive bony lesions or pathologic fractures.   Electronically Signed   By: Lahoma Crocker M.D.   On: 04/26/2014 12:04   US Abdomen Complete  04/24/2014   CLINICAL DATA:  Patient with diffuse abdominal pain.  EXAM: ULTRASOUND ABDOMEN COMPLETE  COMPARISON:  CT 03/30/2007  FINDINGS: Gallbladder: Surgically absent  Common bile duct: Diameter: 7 mm  Liver: Multiple hypoechoic lesions are demonstrated throughout the hepatic parenchyma. Reference lesion within the right hepatic lobe measures up to 3 cm. Additionally within the posterior right hepatic lobe there is a 3 x 3 cm homogeneously hyperechoic lesion, compatible with hemangioma, visualized on prior CT 03/30/2007.  IVC: No abnormality visualized.  Pancreas: Visualized portion unremarkable.  Spleen: Normal size. Nonspecific 1 cm hyperechoic lesion along the peripheral margin of the spleen, potentially correlating with previously visualized cystic lesions on prior CT.  Right Kidney: Length: 11.3 cm. Echogenicity within normal limits. No mass or hydronephrosis visualized.  Left Kidney: Length: 11.8 cm. Echogenicity within normal limits. No mass or hydronephrosis visualized.  Abdominal aorta: No aneurysm visualized.  Other  findings: None.  IMPRESSION: Multiple hypoechoic lesions throughout the hepatic parenchyma, concerning for hepatic metastatic disease. Recommend correlation with CT.  Nonspecific focal hyperechoic lesion within the periphery of the spleen, potentially correlating with previously identified splenic cystic lesions.  These results will be called to the ordering clinician or representative by the Radiologist Assistant, and communication documented in the PACS or zVision Dashboard.   Electronically Signed   By: Lovey Newcomer M.D.   On: 04/24/2014 15:37   Nm Pulmonary Perf And Vent  04/24/2014   CLINICAL DATA:  Chest pain  EXAM: NUCLEAR MEDICINE VENTILATION - PERFUSION LUNG SCAN  Views: Anterior, posterior, left lateral, right lateral, RPO, LPO, RAO, LAO -ventilation and perfusion  Radionuclide: Technetium 52m DTPA -ventilation; Technetium 77m macroaggregated albumin-perfusion  Dose:  44.0 mCi-ventilation; 6.5 mCi-perfusion  Route of administration: Inhalation-ventilation; intravenous -perfusion  COMPARISON:  Chest radiograph April 23, 2014  FINDINGS: Ventilation: Radiotracer uptake is homogeneous and symmetric bilaterally. No ventilation defects are identified.  Perfusion: Radiotracer uptake is homogeneous and symmetric bilaterally. No perfusion defects are identified.  IMPRESSION: No ventilation or perfusion defects. Very low probability of pulmonary embolus.   Electronically Signed   By: Lowella Grip M.D.   On: 04/24/2014 19:44    Microbiology: No results found for this or any previous visit (from the past 240 hour(s)).   Labs:  Basic Metabolic Panel:  Recent Labs Lab 04/23/14 1345 04/24/14 0556 04/24/14 0730 04/25/14 0618  NA 137 140  --  142  K 3.6* 3.9  --  4.2  CL 102 101  --  102  CO2 20 24  --  27  GLUCOSE 132* 103*  --  93  BUN 10 11  --  10  CREATININE 0.79 0.81  --  0.86  CALCIUM 9.3 9.5  --  9.3  MG  --   --  2.1  --    Liver Function Tests:  Recent Labs Lab 04/24/14 0639  04/25/14 0618  AST 120* 105*  ALT 117* 112*  ALKPHOS 236* 236*  BILITOT 0.4 0.4  PROT 6.9 7.1  ALBUMIN 3.3* 3.5    Recent Labs Lab 04/24/14 0639  LIPASE 33   No results for input(s): AMMONIA in the last 168 hours. CBC:  Recent Labs Lab 04/23/14 1345 04/24/14 0639 04/25/14 0618  WBC 9.9 9.2 8.3  NEUTROABS 7.4  --   --   HGB 13.3 13.0 12.9  HCT 40.0 39.1 40.9  MCV 93.0 94.0 96.9  PLT 232 232 227   Cardiac Enzymes:  Recent Labs Lab 04/23/14 1345 04/23/14 1720 04/23/14 1946 04/23/14 2246  TROPONINI <0.30 <0.30 <0.30 <0.30   BNP: BNP (last 3 results) No results for input(s): PROBNP in the last 8760 hours. CBG: No results for input(s): GLUCAP in the last 168 hours.     SignedLelon Frohlich  [facility] Pager: [phone] 04/27/2014, 2:37 PM

## 2014-04-27 NOTE — Progress Notes (Signed)
Discharge instructions reviewed with pt and no current questions or concerns. Aware of cancer appt made for 05/09/14. Prescriptions given.

## 2014-04-27 NOTE — Procedures (Signed)
US guided liver lesion biopsy.  Samples taken from left hepatic lobe.  No immediate complication.

## 2014-04-27 NOTE — Progress Notes (Signed)
Pt has arrived back from procedure at [facility]. Pt is alert and oriented. Only complaint is hunger. MD notified and ordered regular diet for the pt.

## 2014-04-27 NOTE — Sedation Documentation (Signed)
Patient to [place] in good condition, site dry and intact.

## 2014-04-27 NOTE — Progress Notes (Signed)
UR completed 

## 2014-04-27 NOTE — Sedation Documentation (Signed)
Patient biopsy site dry and intact, patient tolerating food and drink well.

## 2014-05-01 LAB — CHROMOGRANIN A: Chromogranin A: 10 ng/mL (ref <=15)

## 2014-05-02 LAB — 5 HIAA, QUANTITATIVE, URINE, 24 HOUR
5-HIAA, 24 Hr Urine: 3.3 mg/24 h (ref <=6.0)
VOLUME, URINE-5HIAA: 2725 mL/(24.h)

## 2014-05-03 LAB — SEROTONIN SERUM: Serotonin, Serum: <10 ng/mL — ABNORMAL LOW (ref 56–244)

## 2014-05-08 NOTE — Progress Notes (Signed)
Allison Labrum, MD [place] [address]  Stage IV metastatic adenocarcinoma with metastases to liver and bone S/p Liver Biopsy on 04/27/2014  Had last mammogram in [place] this year, Dr. Gaetano Net  Colonoscopy 05/18/2013  Breast Reduction 03/17/2011  CURRENT THERAPY: workup in progress  INTERVAL HISTORY: [name] 48 y.o. female returns for follow-up of recent liver biopsy.  She took a trip to [place] over the holidays but came home early.  She noticed a pop in her back and new onset back pain.   Had a hysterectomy in 2012, ovaries were not removed. Last hormone levels were reported to be pre-menopausal. She is noticing hot flashes lately.  She is taking norco for pain. She takes 3 per day. She is able to do all of her ADL's.  She has had minimal weight loss.     MEDICAL HISTORY: Past Medical History  Diagnosis Date  . PONV (postoperative nausea and vomiting)     pt states scope patch does well  . Anemia   . Depression   . Anxiety   . Headache(784.0)     has migraines - medication controls  . Breast cancer 04/2014    has Hypertrophy of breast; Chest pain, rule out acute myocardial infarction; Hyperlipidemia; Depression; Hypokalemia; Elevated LFTs; Liver lesion; Adenocarcinoma determined by biopsy of liver; Intractable abdominal pain; Breast cancer, stage 4; and Metastatic adenocarcinoma on her problem list.      Adenocarcinoma determined by biopsy of liver   04/24/2014 Imaging U/S with multiple hypoechoic lesions throughout the liver   04/25/2014 Imaging CT C/A/P with widespread disease throughout the liver and multiple lytic lesions throughout the spine and pelvis   04/27/2014 Pathology Results U/S guided biopsy with adenocarcinoma CK-7 +, ER + and patchy positivity with PR   05/08/2014 Initial Diagnosis Adenocarcinoma determined by biopsy of liver    Breast cancer, stage 4   04/25/2014 Initial Diagnosis Breast cancer, stage 4   04/25/2014 Imaging CT  abdomen/pelvia with widespread metastatic disease to the liver, multiple lytic lesions throughout spine and pelvis. No FX or epidural tumor identified   04/26/2014 Imaging CT head unremarkable   04/26/2014 Imaging CT chest with no lung mass or pulmonary nodules, no adenopathy. Lytic bone lesions, right 2nd rib   04/27/2014 Initial Biopsy U/S guided liver biopsy, lesion in anterior and inferior left hepatic lobe biopsied   04/27/2014 Pathology Results Metastatic adenocarcinoma, CK7, ER+, patchy positivity with PR. Possible primary includes breast, less likely gynecologic     has No Known Allergies.  Ms. Kilgour had no medications administered during this visit.  SURGICAL HISTORY: Past Surgical History  Procedure Laterality Date  . Right rotator cuff      2002  . Neck fusion      2003  . Laparoscopic cholecystectomy      2004  . Right knee arthroscopy      2005  . Colon surgery      2008  . Appendectomy      2008  . Abdominal hysterectomy    . Breast reduction surgery  03/17/2011    Procedure: MAMMARY REDUCTION BILATERAL (BREAST);  Surgeon: Mary A Contogiannis;  Location: [place];  Service: Plastics;  Laterality: Bilateral;  . Colonoscopy N/A 07/13/2013    Procedure: COLONOSCOPY;  Surgeon: Rogene Houston, MD;  Location[facility];  Service: Endoscopy;  Laterality: N/A;  930  . Liver biopsy  04/2014    SOCIAL HISTORY: History  Social History  . Marital Status: Married    Spouse Name: N/A    Number of Children: N/A  . Years of Education: N/A   Occupational History  . Not on file.   Social History Main Topics  . Smoking status: Former Smoker    Types: Cigarettes  . Smokeless tobacco: Never Used  . Alcohol Use: Yes     Comment: Occasionally  . Drug Use: No  . Sexual Activity: Yes    Birth Control/ Protection: Surgical   Other Topics Concern  . Not on file   Social History Narrative    FAMILY HISTORY: Family History  Problem Relation Age of  Onset  . Colon cancer Neg Hx   . Diabetes Father   . Heart attack Maternal Grandmother 30    multiple over lifetime.  Father is living at 75 COPD, heavy smoker Mother is healthy at 56, heavy smoker NO family history of breast cancer, paternal grandfather with lung cancer was a smoker, maternal grandfather with prostate cancer. Paternal cousin with "brain cancer"   Review of Systems  Constitutional: Positive for malaise/fatigue and diaphoresis. Negative for fever, chills and weight loss.  HENT: Negative for congestion, ear discharge, ear pain, hearing loss, nosebleeds, sore throat and tinnitus.        History migraines  Eyes: Negative.   Respiratory: Negative.  Negative for stridor.        But notices with deep inspiration R upper quadrant pain  Cardiovascular: Negative.   Gastrointestinal: Positive for nausea and abdominal pain. Negative for heartburn, vomiting, diarrhea, constipation, blood in stool and melena.       On stool softener  Genitourinary: Negative.   Musculoskeletal: Negative for myalgias, back pain, joint pain, falls and neck pain.       Mid back pain, only relief is lying flat  Skin: Negative.   Neurological: Positive for weakness. Negative for dizziness, tingling, tremors, sensory change, speech change, focal weakness, seizures, loss of consciousness and headaches.  Endo/Heme/Allergies: Negative.   Psychiatric/Behavioral: Negative.     PHYSICAL EXAMINATION  ECOG PERFORMANCE STATUS: 1 - Symptomatic but completely ambulatory  Filed Vitals:   05/09/14 0920  BP: 112/72  Pulse: 87  Temp: 98.8 F (37.1 C)  Resp: 16    Physical Exam  Constitutional: She is oriented to person, place, and time and well-developed, well-nourished, and in no distress.  HENT:  Head: Normocephalic and atraumatic.  Nose: Nose normal.  Mouth/Throat: Oropharynx is clear and moist. No oropharyngeal exudate.  Eyes: Conjunctivae and EOM are normal. Pupils are equal, round, and reactive to  light. Right eye exhibits no discharge. Left eye exhibits no discharge. No scleral icterus.  Neck: Normal range of motion. Neck supple. No tracheal deviation present. No thyromegaly present.  Cardiovascular: Normal rate, regular rhythm and normal heart sounds.  Exam reveals no gallop and no friction rub.   No murmur heard. Pulmonary/Chest: Effort normal and breath sounds normal. She has no wheezes. She has no rales.  Breast exam unremarkable except for prior surgical changes from breast reduction  Abdominal: Soft. Bowel sounds are normal. She exhibits no distension and no mass. There is tenderness. There is no rebound and no guarding.  Palpable liver edge  Musculoskeletal: Normal range of motion. She exhibits no edema.  Lymphadenopathy:    She has no cervical adenopathy.  Neurological: She is alert and oriented to person, place, and time. She has normal reflexes. No cranial nerve deficit. Gait normal. Coordination normal.  Skin: Skin is warm  and dry. No rash noted.  Psychiatric: Mood, memory, affect and judgment normal.  Nursing note and vitals reviewed.   LABORATORY DATA:  CBC    Component Value Date/Time   WBC 11.7* 05/17/2014 0557   RBC 4.09 05/17/2014 0557   HGB 12.7 05/17/2014 0557   HCT 39.3 05/17/2014 0557   PLT 255 05/17/2014 0557   MCV 96.1 05/17/2014 0557   MCH 31.1 05/17/2014 0557   MCHC 32.3 05/17/2014 0557   RDW 15.0 05/17/2014 0557   LYMPHSABS 1.8 05/09/2014 1011   MONOABS 0.8 05/09/2014 1011   EOSABS 0.1 05/09/2014 1011   BASOSABS 0.0 05/09/2014 1011   CMP     Component Value Date/Time   NA 136 05/17/2014 0557   K 4.3 05/17/2014 0557   CL 103 05/17/2014 0557   CO2 23 05/17/2014 0557   GLUCOSE 180* 05/17/2014 0557   BUN 13 05/17/2014 0557   CREATININE 0.79 05/17/2014 0557   CALCIUM 9.9 05/17/2014 0557   PROT 7.4 05/17/2014 0557   ALBUMIN 3.6 05/17/2014 0557   AST 133* 05/17/2014 0557   ALT 86* 05/17/2014 0557   ALKPHOS 321* 05/17/2014 0557   BILITOT  1.0 05/17/2014 0557   GFRNONAA >90 05/17/2014 0557   GFRAA >90 05/17/2014 0557     RADIOGRAPHIC STUDIES:  EXAM: CT CHEST WITH CONTRAST  IMPRESSION: 1. No lung mass or pulmonary nodules. 2. No mediastinal or hilar adenopathy. 3. Again noted diffuse metastatic hepatic disease. 4. Lytic bone lesions are noted consistent with metastatic disease. No destructive bony lesions or pathologic fractures.  EXAM: CT ABDOMEN AND PELVIS WITHOUT AND WITH CONTRAST IMPRESSION: 1. Widespread metastatic disease to the liver. 2. Multiple lytic lesions are noted throughout the spine and pelvis consistent with metastatic disease. No pathologic fracture or epidural tumor demonstrated. 3. No primary malignancy identified within the abdomen or pelvis. Consider breast primary. The liver lesions should be amenable to percutaneous biopsy.   PATHOLOGY:  Diagnosis Liver, biopsy, Left hepatic lobe - ADENOCARCINOMA. Microscopic Comment The differential includes metastatic adenocarcinoma. Immunohistochemistry will be performed and reported as an addendum. (JDP:gt, 04/30/14) ADDENDUM: Immunohistochemistry is performed and the adenocarcinoma is positive with cytokeratin 7, estrogen receptor and shows patchy positivity with progesterone receptor. The tumor is negative with CDX2, cytokeratin 20, gross cystic disease fluid protein, Napsin-A, thyroid transcription factor-1 and WT-1. Possible primary sites based on the immunophenotype include breast and less likely gynecologic. (JDP:kh 05-01-14) Claudette Laws MD Pathologist, Electronic Signature (Case signed 04/30/2014) Corrected Report Signer Claudette Laws MD Pathologist, Electronic Signature (Case signed 05/01/2014) Specimen Gross and Clinical Information Specimen(s) Obtained: Liver, biopsy, Left hepatic lobe 1 of   ASSESSMENT and THERAPY PLAN:    Breast cancer, stage 33 Pleasant 48 year old female who presented to the inpatient service at [facility] with chest pain, upper abdominal pain in early December. Imaging studies revealed diffuse metastatic disease in the liver and lytic bone disease. She is up-to-date on all of her well care including yearly mammograms and colonoscopy. She has had a breast reduction and recently underwent a hysterectomy, ovaries were not removed. Pathology is consistent with a metastatic adenocarcinoma that appears to be a breast primary. We need to add a HER-2 to her pathology.  I discussed with her my thoughts that this is a primary breast cancer. I advised her that we have additional testing to perform however. I have recommended another mammogram, breast MRI, PET scan, and additional pathologic studies as detailed. Given her worsening back pain I recommended plain films of the  spine to rule out the possibility of an acute compression fracture. Currently she feels her pain medication is adequate. I addressed good pain control with Krissi, including the use of long-acting narcotics if needed. I discussed with she and her husband presenting her case at the breast cancer multidisciplinary tumor Board. I will see her back once all the above studies are complete. She has met Haley our patient navigator and I have advised her to call Haley anytime with questions or concerns in the interim.    All questions were answered. The patient knows to call the clinic with any problems, questions or concerns. We can certainly see the patient much sooner if necessary.  , Kristen 05/20/2014     

## 2014-05-09 ENCOUNTER — Encounter: Payer: Self-pay | Admitting: *Deleted

## 2014-05-09 ENCOUNTER — Other Ambulatory Visit (HOSPITAL_COMMUNITY): Payer: Self-pay | Admitting: Hematology & Oncology

## 2014-05-09 ENCOUNTER — Encounter (HOSPITAL_COMMUNITY): Payer: 59 | Attending: Hematology & Oncology | Admitting: Hematology & Oncology

## 2014-05-09 ENCOUNTER — Encounter (HOSPITAL_COMMUNITY): Payer: Self-pay | Admitting: Hematology & Oncology

## 2014-05-09 ENCOUNTER — Ambulatory Visit (HOSPITAL_COMMUNITY)
Admission: RE | Admit: 2014-05-09 | Discharge: 2014-05-09 | Disposition: A | Discharge status: Home / Self Care | Payer: 59 | Source: Ambulatory Visit | Attending: Hematology & Oncology | Admitting: Hematology & Oncology

## 2014-05-09 VITALS — BP 112/72 | HR 87 | Temp 98.8°F | Resp 16 | Ht 68.5 in | Wt 248.1 lb

## 2014-05-09 DIAGNOSIS — C7951 Secondary malignant neoplasm of bone: Secondary | ICD-10-CM | POA: Diagnosis not present

## 2014-05-09 DIAGNOSIS — C50919 Malignant neoplasm of unspecified site of unspecified female breast: Secondary | ICD-10-CM

## 2014-05-09 DIAGNOSIS — M546 Pain in thoracic spine: Secondary | ICD-10-CM | POA: Insufficient documentation

## 2014-05-09 DIAGNOSIS — C229 Malignant neoplasm of liver, not specified as primary or secondary: Secondary | ICD-10-CM

## 2014-05-09 DIAGNOSIS — C787 Secondary malignant neoplasm of liver and intrahepatic bile duct: Secondary | ICD-10-CM

## 2014-05-09 DIAGNOSIS — C801 Malignant (primary) neoplasm, unspecified: Secondary | ICD-10-CM

## 2014-05-09 DIAGNOSIS — M549 Dorsalgia, unspecified: Secondary | ICD-10-CM

## 2014-05-09 LAB — CBC WITH DIFFERENTIAL/PLATELET
Basophils Absolute: 0 10*3/uL (ref 0.0–0.1)
Basophils Relative: 0 % (ref 0–1)
EOS ABS: 0.1 10*3/uL (ref 0.0–0.7)
EOS PCT: 1 % (ref 0–5)
HCT: 40.8 % (ref 36.0–46.0)
Hemoglobin: 13.2 g/dL (ref 12.0–15.0)
Lymphocytes Relative: 18 % (ref 12–46)
Lymphs Abs: 1.8 10*3/uL (ref 0.7–4.0)
MCH: 31.1 pg (ref 26.0–34.0)
MCHC: 32.4 g/dL (ref 30.0–36.0)
MCV: 96.2 fL (ref 78.0–100.0)
MONOS PCT: 8 % (ref 3–12)
Monocytes Absolute: 0.8 10*3/uL (ref 0.1–1.0)
NEUTROS PCT: 73 % (ref 43–77)
Neutro Abs: 7.4 10*3/uL (ref 1.7–7.7)
PLATELETS: 285 10*3/uL (ref 150–400)
RBC: 4.24 MIL/uL (ref 3.87–5.11)
RDW: 14.3 % (ref 11.5–15.5)
WBC: 10.1 10*3/uL (ref 4.0–10.5)

## 2014-05-09 LAB — COMPREHENSIVE METABOLIC PANEL
ALK PHOS: 285 U/L — AB (ref 39–117)
ALT: 76 U/L — AB (ref 0–35)
ANION GAP: 9 (ref 5–15)
AST: 113 U/L — ABNORMAL HIGH (ref 0–37)
Albumin: 3.8 g/dL (ref 3.5–5.2)
BILIRUBIN TOTAL: 0.8 mg/dL (ref 0.3–1.2)
BUN: 15 mg/dL (ref 6–23)
CHLORIDE: 99 meq/L (ref 96–112)
CO2: 27 mmol/L (ref 19–32)
Calcium: 9.7 mg/dL (ref 8.4–10.5)
Creatinine, Ser: 0.93 mg/dL (ref 0.50–1.10)
GFR calc non Af Amer: 70 mL/min — ABNORMAL LOW (ref >90)
GFR, EST AFRICAN AMERICAN: 82 mL/min — AB (ref >90)
GLUCOSE: 91 mg/dL (ref 70–99)
POTASSIUM: 4.2 mmol/L (ref 3.5–5.1)
Sodium: 135 mmol/L (ref 135–145)
Total Protein: 7.6 g/dL (ref 6.0–8.3)

## 2014-05-09 MED ORDER — TAMOXIFEN CITRATE 20 MG PO TABS: 20.0000 mg | ORAL_TABLET | Freq: Every day | ORAL | Status: DC

## 2014-05-09 NOTE — Patient Instructions (Addendum)
[facility] Discharge Instructions  RECOMMENDATIONS MADE BY THE CONSULTANT AND ANY TEST RESULTS WILL BE SENT TO YOUR REFERRING PHYSICIAN.  EXAM FINDINGS BY THE PHYSICIAN TODAY AND SIGNS OR SYMPTOMS TO REPORT TO CLINIC OR PRIMARY PHYSICIAN:    Call with any questions. Please call Hildred Alamin [phone].  We are doing further testing on your liver biospy so that we will know more of the characteristics of your cancer.  If you have questions about your tests that have been ordered please call me [phone] Hildred Alamin)  Return to [place] on Friday May 18, 2014 @ 10:30. Please arrive @ 10:15.   Thank you for choosing [facility] to provide your oncology and hematology care.  To afford each patient quality time with our providers, please arrive at least 15 minutes before your scheduled appointment time.  With your help, our goal is to use those 15 minutes to complete the necessary work-up to ensure our physicians have the information they need to help with your evaluation and healthcare recommendations.    Effective January 1st, 2014, we ask that you re-schedule your appointment with our physicians should you arrive 10 or more minutes late for your appointment.  We strive to give you quality time with our providers, and arriving late affects you and other patients whose appointments are after yours.    Again, thank you for choosing [facility].  Our hope is that these requests will decrease the amount of time that you wait before being seen by our physicians.       _____________________________________________________________  Should you have questions after your visit to [facility], please contact our office at [phone] between the hours of 8:30 a.m. and 4:30 p.m.  Voicemails left after 4:30 p.m. will not be returned until the following business day.  For prescription refill requests, have your pharmacy contact our office with your prescription  refill request.    _______________________________________________________________  We hope that we have given you very good care.  You may receive a patient satisfaction survey in the mail, please complete it and return it as soon as possible.  We value your feedback!  _______________________________________________________________  Have you asked about our STAR program?  STAR stands for Survivorship Training and Rehabilitation, and this is a nationally recognized cancer care program that focuses on survivorship and rehabilitation.  Cancer and cancer treatments may cause problems, such as, pain, making you feel tired and keeping you from doing the things that you need or want to do. Cancer rehabilitation can help. Our goal is to reduce these troubling effects and help you have the best quality of life possible.  You may receive a survey from a nurse that asks questions about your current state of health.  Based on the survey results, all eligible patients will be referred to the [facility] program for an evaluation so we can better serve you!  A frequently asked questions sheet is available upon request. Positron Emission Tomography (PET Scan) PET stands for positron emission tomography. This is a test similar to an X-ray. Pictures can be taken of a body part after injection of a very small dose of a chemical called a radionuclide. This is combined with sugar, water, or ammonia to give off tiny particles called positrons. The positrons emitted are like small bursts of energy that can be detected by a scanner. They are processed by a computer to create images. These images can be used to study different diseases.  They are often used to study cancer and cancer therapy. A scan of the entire body can be done and used to study all its parts. Because this test is tagged to a sugar used by cells, the bursts of energy show up differently in cells that use sugar faster. The computer is able to produce a color-coded  picture based on this. The colors and amount of brightness on a PET image show different levels of tissue or organ function. For example, a cancer grows faster than healthy tissue and uses more sugar than normal tissue. It will absorb more of the substance injected. This causes it to appear brighter than normal tissue on the PET image. A specialist will read and explain the images. Other examinations, such as recent CT (or CAT) scans or MRI scans may help with interpretation and should be brought along. There are usually no restrictions after the test. You should drink plenty of fluids to flush the radioactive substance from your body. BEFORE THE PROCEDURE   PET is usually an outpatient procedure. Wear comfortable, loose-fitting clothes.  Do not eat for four hours before the scan. You will be encouraged to drink water.  Your caregiver will instruct you regarding the use of medications before the test.  Note: Diabetic patients should ask for any specific diet guidelines to control glucose (sugar) levels during the day of the test. There are limitations with the test if your blood sugar is not controlled during or before the test.  Be on time because of the rapid decay of the radioactive material that must be injected. PROCEDURE  Before the procedure begins a small amount of harmless radioactive material will be injected into a vein. This means you will have a needle stick. It will take from 30 minutes to one hour for the material to travel around your body in preparation for the scan. You will lie on a cushioned table and be moved through the center of a machine that looks like a large doughnut. This is the machine that detects the positrons. It is connected to a computer that produces images that can be viewed on a monitor. This will take about 30 minutes to an hour, during which you must remain still. Let your caregiver know if this will be difficult for you. Also, let your caregiver know if you need  a sedative or help dealing with claustrophobia (feeling uncomfortable in enclosed spaces). HOME CARE INSTRUCTIONS   For the protection of your privacy, test results can not be given over the phone. Make sure you receive the results of your test. Ask as to how these results are to be obtained if you have not been informed. It is your responsibility to obtain your test results.  Drink several 8-once glasses of water following the test to flush the small amount of radioactive material out of your body.  Keep your follow-up appointments. Document Released: 11/01/2002 Document Revised: 07/20/2011 Document Reviewed: 08/09/2013 [facility] Patient Information 2015 [place], [place]. This information is not intended to replace advice given to you by your health care provider. Make sure you discuss any questions you have with your health care provider.

## 2014-05-09 NOTE — Progress Notes (Signed)
[facility] Clinical Social Work  Clinical Social Work was referred by Futures trader for assessment of psychosocial needs due to new Stage IV breast cancer diagnosis.  Clinical Social Worker met with patient and her husband, son and father at [facility] to offer support and assess for needs.  Pt appears to have a strong support network and is open to CSW support. CSW reviewed role of CSW and resources to assist. CSW briefly discussed common emotions and coping techniques as well. CSW provided pt with handout on role of CSW and discussed resources of Duanne [place], [facility], [place] and others. CSW to mail information re. Breast Cancer Support Groups as well. Pt and family aware of how to access CSW as needed.   Clinical Social Work interventions: CSW role education Resource education [place], [place] Tuesdays 8:30-1pm Wednesdays 8:30-12pm  [phone]

## 2014-05-09 NOTE — Progress Notes (Signed)
I spoke with [facility] @ Pathology and asked her to add on a HER2 to patient's liver tissue biopsy.

## 2014-05-10 ENCOUNTER — Other Ambulatory Visit (HOSPITAL_COMMUNITY): Payer: Self-pay | Admitting: Hematology & Oncology

## 2014-05-10 DIAGNOSIS — C50919 Malignant neoplasm of unspecified site of unspecified female breast: Secondary | ICD-10-CM

## 2014-05-10 LAB — FOLLICLE STIMULATING HORMONE: FSH: 14.6 m[IU]/mL

## 2014-05-10 LAB — CANCER ANTIGEN 27.29: CA 27.29: 5683 U/mL — ABNORMAL HIGH (ref 0–39)

## 2014-05-10 LAB — VITAMIN D 25 HYDROXY (VIT D DEFICIENCY, FRACTURES): Vit D, 25-Hydroxy: 22 ng/mL — ABNORMAL LOW (ref 30–100)

## 2014-05-10 LAB — ESTRADIOL: Estradiol: 78 pg/mL

## 2014-05-10 LAB — CANCER ANTIGEN 15-3: Cancer Antigen-Breast 15-3: >4000 U/mL — ABNORMAL HIGH

## 2014-05-15 ENCOUNTER — Encounter (HOSPITAL_COMMUNITY): Payer: 59

## 2014-05-15 ENCOUNTER — Ambulatory Visit (HOSPITAL_COMMUNITY)
Admission: RE | Admit: 2014-05-15 | Discharge: 2014-05-15 | Disposition: A | Discharge status: Home / Self Care | Payer: 59 | Source: Ambulatory Visit | Attending: Hematology & Oncology | Admitting: Hematology & Oncology

## 2014-05-15 DIAGNOSIS — K5900 Constipation, unspecified: Secondary | ICD-10-CM | POA: Diagnosis present

## 2014-05-15 DIAGNOSIS — Z981 Arthrodesis status: Secondary | ICD-10-CM

## 2014-05-15 DIAGNOSIS — Z8249 Family history of ischemic heart disease and other diseases of the circulatory system: Secondary | ICD-10-CM

## 2014-05-15 DIAGNOSIS — C50919 Malignant neoplasm of unspecified site of unspecified female breast: Secondary | ICD-10-CM | POA: Insufficient documentation

## 2014-05-15 DIAGNOSIS — C229 Malignant neoplasm of liver, not specified as primary or secondary: Secondary | ICD-10-CM

## 2014-05-15 DIAGNOSIS — C787 Secondary malignant neoplasm of liver and intrahepatic bile duct: Principal | ICD-10-CM | POA: Diagnosis present

## 2014-05-15 DIAGNOSIS — Z79899 Other long term (current) drug therapy: Secondary | ICD-10-CM

## 2014-05-15 DIAGNOSIS — C7951 Secondary malignant neoplasm of bone: Secondary | ICD-10-CM | POA: Diagnosis present

## 2014-05-15 DIAGNOSIS — Z833 Family history of diabetes mellitus: Secondary | ICD-10-CM

## 2014-05-15 DIAGNOSIS — Z79810 Long term (current) use of selective estrogen receptor modulators (SERMs): Secondary | ICD-10-CM

## 2014-05-15 DIAGNOSIS — Z87891 Personal history of nicotine dependence: Secondary | ICD-10-CM

## 2014-05-15 DIAGNOSIS — Z79891 Long term (current) use of opiate analgesic: Secondary | ICD-10-CM

## 2014-05-15 DIAGNOSIS — G893 Neoplasm related pain (acute) (chronic): Secondary | ICD-10-CM | POA: Diagnosis present

## 2014-05-16 ENCOUNTER — Encounter (HOSPITAL_COMMUNITY): Payer: Self-pay

## 2014-05-16 ENCOUNTER — Encounter (HOSPITAL_COMMUNITY): Payer: Self-pay | Admitting: Family Medicine

## 2014-05-16 ENCOUNTER — Inpatient Hospital Stay (HOSPITAL_COMMUNITY): Payer: 59

## 2014-05-16 ENCOUNTER — Other Ambulatory Visit (HOSPITAL_COMMUNITY): Payer: Self-pay | Admitting: *Deleted

## 2014-05-16 ENCOUNTER — Inpatient Hospital Stay (HOSPITAL_COMMUNITY)
Admission: AD | Admit: 2014-05-16 | Discharge: 2014-05-18 | DRG: 436 | Disposition: A | Discharge status: Home / Self Care | Payer: 59 | Source: Ambulatory Visit | Attending: Family Medicine | Admitting: Family Medicine

## 2014-05-16 ENCOUNTER — Encounter (HOSPITAL_COMMUNITY)
Admission: RE | Admit: 2014-05-16 | Discharge: 2014-05-16 | Disposition: A | Discharge status: Home / Self Care | Payer: 59 | Source: Ambulatory Visit | Attending: Hematology & Oncology | Admitting: Hematology & Oncology

## 2014-05-16 DIAGNOSIS — G893 Neoplasm related pain (acute) (chronic): Secondary | ICD-10-CM | POA: Diagnosis present

## 2014-05-16 DIAGNOSIS — Z833 Family history of diabetes mellitus: Secondary | ICD-10-CM | POA: Diagnosis not present

## 2014-05-16 DIAGNOSIS — Z87891 Personal history of nicotine dependence: Secondary | ICD-10-CM | POA: Diagnosis not present

## 2014-05-16 DIAGNOSIS — Z79810 Long term (current) use of selective estrogen receptor modulators (SERMs): Secondary | ICD-10-CM | POA: Diagnosis not present

## 2014-05-16 DIAGNOSIS — Z79891 Long term (current) use of opiate analgesic: Secondary | ICD-10-CM | POA: Diagnosis not present

## 2014-05-16 DIAGNOSIS — C7951 Secondary malignant neoplasm of bone: Secondary | ICD-10-CM | POA: Diagnosis present

## 2014-05-16 DIAGNOSIS — R109 Unspecified abdominal pain: Secondary | ICD-10-CM | POA: Diagnosis present

## 2014-05-16 DIAGNOSIS — Z981 Arthrodesis status: Secondary | ICD-10-CM | POA: Diagnosis not present

## 2014-05-16 DIAGNOSIS — R1013 Epigastric pain: Secondary | ICD-10-CM | POA: Insufficient documentation

## 2014-05-16 DIAGNOSIS — C50919 Malignant neoplasm of unspecified site of unspecified female breast: Secondary | ICD-10-CM

## 2014-05-16 DIAGNOSIS — K5900 Constipation, unspecified: Secondary | ICD-10-CM | POA: Diagnosis present

## 2014-05-16 DIAGNOSIS — C787 Secondary malignant neoplasm of liver and intrahepatic bile duct: Secondary | ICD-10-CM | POA: Diagnosis present

## 2014-05-16 DIAGNOSIS — C229 Malignant neoplasm of liver, not specified as primary or secondary: Secondary | ICD-10-CM

## 2014-05-16 DIAGNOSIS — R0781 Pleurodynia: Secondary | ICD-10-CM

## 2014-05-16 DIAGNOSIS — Z79899 Other long term (current) drug therapy: Secondary | ICD-10-CM | POA: Diagnosis not present

## 2014-05-16 DIAGNOSIS — Z8249 Family history of ischemic heart disease and other diseases of the circulatory system: Secondary | ICD-10-CM | POA: Diagnosis not present

## 2014-05-16 LAB — CBC
HCT: 36.6 % (ref 36.0–46.0)
Hemoglobin: 11.8 g/dL — ABNORMAL LOW (ref 12.0–15.0)
MCH: 30.8 pg (ref 26.0–34.0)
MCHC: 32.2 g/dL (ref 30.0–36.0)
MCV: 95.6 fL (ref 78.0–100.0)
Platelets: 242 10*3/uL (ref 150–400)
RBC: 3.83 MIL/uL — ABNORMAL LOW (ref 3.87–5.11)
RDW: 14.9 % (ref 11.5–15.5)
WBC: 11.7 10*3/uL — ABNORMAL HIGH (ref 4.0–10.5)

## 2014-05-16 LAB — COMPREHENSIVE METABOLIC PANEL
ALBUMIN: 3.3 g/dL — AB (ref 3.5–5.2)
ALT: 80 U/L — ABNORMAL HIGH (ref 0–35)
AST: 126 U/L — ABNORMAL HIGH (ref 0–37)
Alkaline Phosphatase: 297 U/L — ABNORMAL HIGH (ref 39–117)
Anion gap: 6 (ref 5–15)
BILIRUBIN TOTAL: 0.7 mg/dL (ref 0.3–1.2)
BUN: 14 mg/dL (ref 6–23)
CO2: 26 mmol/L (ref 19–32)
CREATININE: 0.76 mg/dL (ref 0.50–1.10)
Calcium: 9.1 mg/dL (ref 8.4–10.5)
Chloride: 103 mEq/L (ref 96–112)
GFR calc non Af Amer: >90 mL/min (ref >90)
GLUCOSE: 123 mg/dL — AB (ref 70–99)
POTASSIUM: 4.1 mmol/L (ref 3.5–5.1)
Sodium: 135 mmol/L (ref 135–145)
Total Protein: 6.8 g/dL (ref 6.0–8.3)

## 2014-05-16 LAB — GLUCOSE, CAPILLARY: GLUCOSE-CAPILLARY: 84 mg/dL (ref 70–99)

## 2014-05-16 LAB — LIPASE, BLOOD: Lipase: 23 U/L (ref 11–59)

## 2014-05-16 MED ORDER — SODIUM CHLORIDE 0.9 % IJ SOLN: 9.0000 mL | INTRAMUSCULAR | Status: DC | PRN

## 2014-05-16 MED ORDER — MORPHINE SULFATE (PF) 1 MG/ML IV SOLN
INTRAVENOUS | Status: DC
  Administered 2014-05-16: 20:00:00 via INTRAVENOUS
  Administered 2014-05-17: 1.5 mg via INTRAVENOUS
  Administered 2014-05-17: 3 mg via INTRAVENOUS
  Administered 2014-05-17: 9 mg via INTRAVENOUS
  Administered 2014-05-17: 11:00:00 via INTRAVENOUS
  Administered 2014-05-18: 1.5 mg via INTRAVENOUS
  Administered 2014-05-18: 0 mg via INTRAVENOUS
  Filled 2014-05-16 (×2): qty 25

## 2014-05-16 MED ORDER — PANTOPRAZOLE SODIUM 40 MG PO TBEC: 40.0000 mg | DELAYED_RELEASE_TABLET | Freq: Every day | ORAL | Status: DC

## 2014-05-16 MED ORDER — TAMOXIFEN CITRATE 10 MG PO TABS
20.0000 mg | ORAL_TABLET | Freq: Every day | ORAL | Status: DC
  Administered 2014-05-17 – 2014-05-18 (×2): 20 mg via ORAL
  Filled 2014-05-16 (×2): qty 2

## 2014-05-16 MED ORDER — FENTANYL 12 MCG/HR TD PT72: 12.5000 ug | MEDICATED_PATCH | TRANSDERMAL | Status: DC

## 2014-05-16 MED ORDER — ENSURE COMPLETE PO LIQD: 237.0000 mL | Freq: Two times a day (BID) | ORAL | Status: DC

## 2014-05-16 MED ORDER — ONDANSETRON HCL 4 MG PO TABS: 8.0000 mg | ORAL_TABLET | Freq: Three times a day (TID) | ORAL | Status: DC | PRN

## 2014-05-16 MED ORDER — OXYCODONE HCL 5 MG PO TABS: 5.0000 mg | ORAL_TABLET | ORAL | Status: DC | PRN

## 2014-05-16 MED ORDER — ACETAMINOPHEN 325 MG PO TABS
650.0000 mg | ORAL_TABLET | Freq: Four times a day (QID) | ORAL | Status: DC | PRN
  Administered 2014-05-16: 650 mg via ORAL
  Filled 2014-05-16: qty 2

## 2014-05-16 MED ORDER — SODIUM CHLORIDE 0.9 % IJ SOLN
INTRAMUSCULAR | Status: AC
  Administered 2014-05-16: 19:00:00
  Filled 2014-05-16: qty 600

## 2014-05-16 MED ORDER — ONDANSETRON HCL 4 MG/2ML IJ SOLN: 4.0000 mg | Freq: Four times a day (QID) | INTRAMUSCULAR | Status: DC | PRN

## 2014-05-16 MED ORDER — ACETAMINOPHEN 650 MG RE SUPP: 650.0000 mg | Freq: Four times a day (QID) | RECTAL | Status: DC | PRN

## 2014-05-16 MED ORDER — NALOXONE HCL 0.4 MG/ML IJ SOLN: 0.4000 mg | INTRAMUSCULAR | Status: DC | PRN

## 2014-05-16 MED ORDER — PANTOPRAZOLE SODIUM 40 MG PO TBEC
40.0000 mg | DELAYED_RELEASE_TABLET | Freq: Every day | ORAL | Status: DC
  Administered 2014-05-17 – 2014-05-18 (×2): 40 mg via ORAL
  Filled 2014-05-16 (×2): qty 1

## 2014-05-16 MED ORDER — DEXAMETHASONE 4 MG PO TABS
8.0000 mg | ORAL_TABLET | Freq: Two times a day (BID) | ORAL | Status: DC
  Administered 2014-05-16 – 2014-05-18 (×4): 8 mg via ORAL
  Filled 2014-05-16 (×11): qty 2

## 2014-05-16 MED ORDER — SODIUM CHLORIDE 0.9 % IJ SOLN: 3.0000 mL | INTRAMUSCULAR | Status: DC | PRN

## 2014-05-16 MED ORDER — DIPHENHYDRAMINE HCL 12.5 MG/5ML PO ELIX: 12.5000 mg | ORAL_SOLUTION | Freq: Four times a day (QID) | ORAL | Status: DC | PRN

## 2014-05-16 MED ORDER — SODIUM CHLORIDE 0.9 % IJ SOLN
3.0000 mL | Freq: Two times a day (BID) | INTRAMUSCULAR | Status: DC
  Administered 2014-05-16: 3 mL via INTRAVENOUS

## 2014-05-16 MED ORDER — ALPRAZOLAM 0.5 MG PO TABS
0.5000 mg | ORAL_TABLET | Freq: Two times a day (BID) | ORAL | Status: DC | PRN
  Administered 2014-05-17 – 2014-05-18 (×2): 0.5 mg via ORAL
  Filled 2014-05-16 (×2): qty 1

## 2014-05-16 MED ORDER — FLUDEOXYGLUCOSE F - 18 (FDG) INJECTION
12.4000 | Freq: Once | INTRAVENOUS | Status: AC | PRN
  Administered 2014-05-16: 12.4 via INTRAVENOUS

## 2014-05-16 MED ORDER — SODIUM CHLORIDE 0.9 % IJ SOLN
3.0000 mL | Freq: Two times a day (BID) | INTRAMUSCULAR | Status: DC
  Administered 2014-05-17: 3 mL via INTRAVENOUS

## 2014-05-16 MED ORDER — IOHEXOL 350 MG/ML SOLN
100.0000 mL | Freq: Once | INTRAVENOUS | Status: AC | PRN
  Administered 2014-05-16: 100 mL via INTRAVENOUS

## 2014-05-16 MED ORDER — DIPHENHYDRAMINE HCL 50 MG/ML IJ SOLN
12.5000 mg | Freq: Four times a day (QID) | INTRAMUSCULAR | Status: DC | PRN
  Administered 2014-05-17: 12.5 mg via INTRAVENOUS
  Filled 2014-05-16: qty 1

## 2014-05-16 MED ORDER — SERTRALINE HCL 50 MG PO TABS
150.0000 mg | ORAL_TABLET | Freq: Every morning | ORAL | Status: DC
  Administered 2014-05-17 – 2014-05-18 (×2): 150 mg via ORAL
  Filled 2014-05-16 (×2): qty 3

## 2014-05-16 MED ORDER — SODIUM CHLORIDE 0.9 % IV SOLN: 250.0000 mL | INTRAVENOUS | Status: DC | PRN

## 2014-05-16 MED ORDER — BOOST / RESOURCE BREEZE PO LIQD
1.0000 | Freq: Three times a day (TID) | ORAL | Status: DC
Start: 2014-05-17 — End: 2014-05-18
  Administered 2014-05-17 – 2014-05-18 (×4): 1 via ORAL

## 2014-05-16 MED ORDER — ENOXAPARIN SODIUM 60 MG/0.6ML ~~LOC~~ SOLN
60.0000 mg | SUBCUTANEOUS | Status: DC
  Administered 2014-05-16 – 2014-05-17 (×2): 60 mg via SUBCUTANEOUS
  Filled 2014-05-16 (×2): qty 0.6

## 2014-05-16 NOTE — H&P (Signed)
History and Physical  [name] MGQ:[id number] DOB: 10/16/1965 DOA: 05/16/2014  Referring physician: Kirby Crigler, PA at [facility] PCP: [name], MD   Chief Complaint: Abdominal pain  HPI:  49 year old woman diagnosed with metastatic breast cancer by liver biopsy 04/2014 who was seen in the office today at the cancer center in excruciating pain. Direct admission was requested to the hospital service for intractable pain.  Patient has been diagnosed with metastatic breast cancer with diffuse liver disease and widespread skeletal disease December 2015 when she presented with chest pain. She was evaluated by cardiology, there is no evidence of coronary disease no further testing was suggested. Patient has been taking hydrocodone 7.5 at home for right-sided upper quadrant pain and rib pain but efficacy has been decreasing. This pain has been constant and worsening. Over the last several days to week developed exquisite epigastric abdominal pain made worse by eating or drinking and also triggered by deep breath. No history of NSAID use or peptic ulcer disease. It is so painful that her oral intake has been quite poor and she strives to prevent deep breathing. She does report increasing dyspnea on exertion over the last few weeks.  She traveled to [place] over the holidays which was about a 6 hour car trip. On last admission in December V/Q scan was negative for pulmonary embolism. No history of blood and blood clots known.  Review of Systems:  Negative for fever, visual changes, sore throat, rash, new muscle aches, chest pain, dysuria, bleeding, nausea, vomiting.  Constipation resolved with stool softener.  Past Medical History  Diagnosis Date  . PONV (postoperative nausea and vomiting)     pt states scope patch does well  . Anemia   . Depression   . Anxiety   . Headache(784.0)     has migraines - medication controls  . Metastatic disease 04/2014    Past Surgical  History  Procedure Laterality Date  . Right rotator cuff      2002  . Neck fusion      2003  . Laparoscopic cholecystectomy      2004  . Right knee arthroscopy      2005  . Colon surgery      2008  . Appendectomy      2008  . Abdominal hysterectomy    . Breast reduction surgery  03/17/2011    Procedure: MAMMARY REDUCTION BILATERAL (BREAST);  Surgeon: Mary A Contogiannis;  Location: [facility];  Service: Plastics;  Laterality: Bilateral;  . Colonoscopy N/A 07/13/2013    Procedure: COLONOSCOPY;  Surgeon: Rogene Houston, MD;  Location: [facility];  Service: Endoscopy;  Laterality: N/A;  930  . Liver biopsy  04/2014    Social History:  reports that she has quit smoking. Her smoking use included Cigarettes. She smoked 0.00 packs per day. She has never used smokeless tobacco. She reports that she drinks alcohol. She reports that she does not use illicit drugs.  No Known Allergies  Family History  Problem Relation Age of Onset  . Colon cancer Neg Hx   . Diabetes Father   . Heart attack Maternal Grandmother 30    multiple over lifetime.     Prior to Admission medications   Medication Sig Start Date End Date Taking? Authorizing Provider  ALPRAZolam Duanne Moron) 0.5 MG tablet Take 1 tablet (0.5 mg total) by mouth 3 (three) times daily as needed for anxiety or sleep. 04/27/14   Radene Gunning, NP  Docusate Calcium (STOOL SOFTENER PO) Take 1 capsule by mouth daily.    Historical Provider, MD  fentaNYL (DURAGESIC) 12 MCG/HR Place 1 patch (12.5 mcg total) onto the skin every 3 (three) days. 05/16/14   Molli Hazard, MD  HYDROcodone-acetaminophen ([place]) 7.5-325 MG per tablet Take 1 tablet by mouth every 8 (eight) hours as needed for moderate pain.    Historical Provider, MD  ondansetron (ZOFRAN) 8 MG tablet Take 1 tablet (8 mg total) by mouth every 8 (eight) hours as needed for nausea or vomiting. 04/27/14   Radene Gunning, NP  oxyCODONE (OXY IR/ROXICODONE) 5 MG immediate  release tablet Take 1 tablet (5 mg total) by mouth every 4 (four) hours as needed for severe pain. 05/16/14   Molli Hazard, MD  pantoprazole (PROTONIX) 40 MG tablet Take 1 tablet (40 mg total) by mouth daily. 05/16/14   Molli Hazard, MD  pravastatin (PRAVACHOL) 20 MG tablet Take 20 mg by mouth daily. 03/27/14   Historical Provider, MD  sertraline (ZOLOFT) 100 MG tablet Take 150 mg by mouth every morning.     Historical Provider, MD  SUMAtriptan (IMITREX) 100 MG tablet Take 100 mg by mouth as needed. foir migraine 03/26/14   Historical Provider, MD  tamoxifen (NOLVADEX) 20 MG tablet Take 1 tablet (20 mg total) by mouth daily. 05/09/14   Molli Hazard, MD   Physical Exam: Filed Vitals:   05/16/14 1739  BP: 119/59  Pulse: 85  Temp: 98.9 F (37.2 C)  TempSrc: Oral  Resp: 16  Height: 5' 8.5" (1.74 m)  Weight: 112.492 kg (248 lb)  SpO2: 96%   General:  Appears calm and moderately uncomfortable, nontoxic. Eyes: PERRL, normal lids, irises  ENT: grossly normal hearing, lips & tongue Neck: no LAD, masses or thyromegaly Cardiovascular: RRR, no m/r/g. No LE edema. Respiratory: CTA bilaterally, no w/r/r. Normal respiratory effort. Abdomen: Obese, soft, diffusely tender, especially exquisite epigastric pain and right upper quadrant pain and rib pain. Skin: no rash or induration seen on limited exam Musculoskeletal: grossly normal tone BUE/BLE Psychiatric: grossly normal mood and affect, speech fluent and appropriate Neurologic: grossly non-focal.  Wt Readings from Last 3 Encounters:  05/09/14 112.537 kg (248 lb 1.6 oz)  04/23/14 110.678 kg (244 lb)  07/13/13 104.327 kg (230 lb)    Labs on Admission:  Pending  CBG:  Recent Labs Lab 05/16/14 1155  GLUCAP 84     Radiological Exams on Admission: Nm Pet Image Initial (pi) Skull Base To Thigh  05/16/2014   CLINICAL DATA:  Initial treatment strategy for breast carcinoma. Stage IV breast carcinoma.  EXAM:  NUCLEAR MEDICINE PET SKULL BASE TO THIGH  TECHNIQUE: 12.4 mCi F-18 FDG was injected intravenously. Full-ring PET imaging was performed from the skull base to thigh after the radiotracer. CT data was obtained and used for attenuation correction and anatomic localization.  FASTING BLOOD GLUCOSE:  Value: 84 mg/dl  COMPARISON:  CT 04/26/2014  FINDINGS: NECK  No hypermetabolic lymph nodes in the neck.  CHEST  No hypermetabolic mediastinal or hilar nodes. No suspicious pulmonary nodules on the CT scan.  ABDOMEN/PELVIS  There is intense hypermetabolic activity diffusely throughout the liver associated the multiple hypodense lesion. This activity is very high with SUV max 21.8.  There no hypermetabolic periportal lymph nodes. No hypermetabolic retroperitoneal or pelvic lymph nodes. No abnormal metabolicassociated with the adrenal glands or spleen.  SKELETON  There are multiple foci of intense metabolic activity associated with lytic lesions throughout the  bones of the pelvis, spine and ribs. Example lytic lesion in the left sacral ala measures 2.8 cm (image 167 series 4) with SUV max 17.6. Similar lytic lesion in the L5 vertebral body with SUV max equals 17.4. Multiple smaller lesions are scattered throughout the spine, ribs, and shoulders.  IMPRESSION: 1. Intensely hypermetabolic hepatic metastasis. 2. Widespread hypermetabolic skeletal lesions. 3. No primary adenocarcinoma identified by FDG PET imaging.   Electronically Signed   By: Suzy Bouchard M.D.   On: 05/16/2014 14:14   Mm Digital Diagnostic Bilat  05/15/2014   CLINICAL DATA:  Patient has metastatic disease to the liver of unknown primary, but with pathology suggesting a possible breast primary. No current breast complaints.  EXAM: DIGITAL DIAGNOSTIC  BILATERAL MAMMOGRAM WITH CAD  COMPARISON:  Prior exams  ACR Breast Density Category a: The breast tissue is almost entirely fatty.  FINDINGS: There are no discrete masses or areas of architectural distortion. Few  small calcifications in the lateral right breast have decreased when compared to prior studies. There was a larger cluster of calcifications seen in 2014 in this location, mostly all resolved. There are no new or suspicious calcifications.  Mammographic images were processed with CAD.  IMPRESSION: No evidence of malignancy.  Benign right breast calcifications.  RECOMMENDATION: Screening mammogram in one year.(Code:[id number]-B-01Y)  I have discussed the findings and recommendations with the patient. Results were also provided in writing at the conclusion of the visit. If applicable, a reminder letter will be sent to the patient regarding the next appointment.  BI-RADS CATEGORY  2: Benign Finding(s)   Electronically Signed   By: Lajean Manes M.D.   On: 05/15/2014 11:39    Principal Problem:   Intractable abdominal pain Active Problems:   Adenocarcinoma determined by biopsy of liver   Breast cancer, stage 4   Assessment/Plan 1. Intractable right upper quadrant, rib cage, bony pain secondary to metastatic disease.  2. Severe epigastric pain with pleuritic component of unclear etiology. Likely secondary to metastatic disease. 3. Stage IV breast cancer with multiple lytic bone lesions throughout the spine and pelvis and diffuse metastatic hepatic disease.   Medically stable. Will monitor in medical unit, pain control per oncology.  Workup epigastric abdominal pain. I suspect this is secondary to known widespread metastatic disease, however the location and symptomology suggests the possibility of pancreatitis or pulmonary embolism. Check lipase. Given her recent trip to [place] over the holidays, plan CT angiogram chest to rule out pulmonary embolism. Check abdominal radiographs but she does report bowel movements and there is no reason to suspect ileus.  Clear liquids.  Discussed in detail with husband at bedside.  Code Status: full code  DVT prophylaxis:Lovenox Family Communication:    Disposition Plan/Anticipated LOS: admit, 2-3 days  Time spent: 60 minutes  Murray Hodgkins, MD  [facility] Pager [phone] 05/16/2014, 5:35 PM

## 2014-05-17 ENCOUNTER — Ambulatory Visit (HOSPITAL_COMMUNITY)
Admit: 2014-05-17 | Discharge: 2014-05-17 | Disposition: A | Discharge status: Home / Self Care | Payer: 59 | Attending: Oncology | Admitting: Oncology

## 2014-05-17 ENCOUNTER — Other Ambulatory Visit (HOSPITAL_COMMUNITY): Payer: Self-pay | Admitting: *Deleted

## 2014-05-17 ENCOUNTER — Ambulatory Visit (HOSPITAL_COMMUNITY)
Admission: AD | Admit: 2014-05-17 | Discharge: 2014-05-17 | Disposition: A | Discharge status: Home / Self Care | Payer: 59 | Source: Ambulatory Visit | Attending: Oncology | Admitting: Oncology

## 2014-05-17 ENCOUNTER — Ambulatory Visit (HOSPITAL_COMMUNITY): Payer: 59

## 2014-05-17 DIAGNOSIS — Z79899 Other long term (current) drug therapy: Secondary | ICD-10-CM | POA: Insufficient documentation

## 2014-05-17 DIAGNOSIS — Z79810 Long term (current) use of selective estrogen receptor modulators (SERMs): Secondary | ICD-10-CM

## 2014-05-17 DIAGNOSIS — R1013 Epigastric pain: Secondary | ICD-10-CM

## 2014-05-17 DIAGNOSIS — Z79891 Long term (current) use of opiate analgesic: Secondary | ICD-10-CM

## 2014-05-17 DIAGNOSIS — C787 Secondary malignant neoplasm of liver and intrahepatic bile duct: Secondary | ICD-10-CM | POA: Insufficient documentation

## 2014-05-17 DIAGNOSIS — D0590 Unspecified type of carcinoma in situ of unspecified breast: Secondary | ICD-10-CM | POA: Insufficient documentation

## 2014-05-17 DIAGNOSIS — C229 Malignant neoplasm of liver, not specified as primary or secondary: Secondary | ICD-10-CM

## 2014-05-17 DIAGNOSIS — Z87891 Personal history of nicotine dependence: Secondary | ICD-10-CM | POA: Insufficient documentation

## 2014-05-17 DIAGNOSIS — C7951 Secondary malignant neoplasm of bone: Secondary | ICD-10-CM

## 2014-05-17 DIAGNOSIS — Z452 Encounter for adjustment and management of vascular access device: Secondary | ICD-10-CM

## 2014-05-17 HISTORY — PX: PORTACATH PLACEMENT: SHX2246

## 2014-05-17 LAB — CBC
HCT: 39.3 % (ref 36.0–46.0)
HEMOGLOBIN: 12.7 g/dL (ref 12.0–15.0)
MCH: 31.1 pg (ref 26.0–34.0)
MCHC: 32.3 g/dL (ref 30.0–36.0)
MCV: 96.1 fL (ref 78.0–100.0)
Platelets: 255 10*3/uL (ref 150–400)
RBC: 4.09 MIL/uL (ref 3.87–5.11)
RDW: 15 % (ref 11.5–15.5)
WBC: 11.7 10*3/uL — ABNORMAL HIGH (ref 4.0–10.5)

## 2014-05-17 LAB — COMPREHENSIVE METABOLIC PANEL
ALK PHOS: 321 U/L — AB (ref 39–117)
ALT: 86 U/L — ABNORMAL HIGH (ref 0–35)
ANION GAP: 10 (ref 5–15)
AST: 133 U/L — ABNORMAL HIGH (ref 0–37)
Albumin: 3.6 g/dL (ref 3.5–5.2)
BUN: 13 mg/dL (ref 6–23)
CHLORIDE: 103 meq/L (ref 96–112)
CO2: 23 mmol/L (ref 19–32)
Calcium: 9.9 mg/dL (ref 8.4–10.5)
Creatinine, Ser: 0.79 mg/dL (ref 0.50–1.10)
GFR calc non Af Amer: >90 mL/min (ref >90)
GLUCOSE: 180 mg/dL — AB (ref 70–99)
POTASSIUM: 4.3 mmol/L (ref 3.5–5.1)
Sodium: 136 mmol/L (ref 135–145)
Total Bilirubin: 1 mg/dL (ref 0.3–1.2)
Total Protein: 7.4 g/dL (ref 6.0–8.3)

## 2014-05-17 MED ORDER — FENTANYL CITRATE 0.05 MG/ML IJ SOLN
INTRAMUSCULAR | Status: AC | PRN
  Administered 2014-05-17: 50 ug via INTRAVENOUS
  Administered 2014-05-17: 25 ug via INTRAVENOUS

## 2014-05-17 MED ORDER — FENTANYL CITRATE 0.05 MG/ML IJ SOLN
INTRAMUSCULAR | Status: AC
  Filled 2014-05-17: qty 4

## 2014-05-17 MED ORDER — POLYETHYLENE GLYCOL 3350 17 G PO PACK
17.0000 g | PACK | Freq: Once | ORAL | Status: AC
  Administered 2014-05-17: 17 g via ORAL
  Filled 2014-05-17: qty 1

## 2014-05-17 MED ORDER — LIDOCAINE HCL 1 % IJ SOLN
INTRAMUSCULAR | Status: AC
  Filled 2014-05-17: qty 20

## 2014-05-17 MED ORDER — CEFAZOLIN SODIUM-DEXTROSE 2-3 GM-% IV SOLR
INTRAVENOUS | Status: AC
  Filled 2014-05-17: qty 50

## 2014-05-17 MED ORDER — CEFAZOLIN SODIUM-DEXTROSE 2-3 GM-% IV SOLR
2.0000 g | INTRAVENOUS | Status: AC
  Administered 2014-05-17: 2 g via INTRAVENOUS

## 2014-05-17 MED ORDER — HEPARIN SOD (PORK) LOCK FLUSH 100 UNIT/ML IV SOLN
INTRAVENOUS | Status: AC
  Filled 2014-05-17: qty 5

## 2014-05-17 MED ORDER — MIDAZOLAM HCL 2 MG/2ML IJ SOLN
INTRAMUSCULAR | Status: AC
  Filled 2014-05-17: qty 6

## 2014-05-17 MED ORDER — SENNOSIDES-DOCUSATE SODIUM 8.6-50 MG PO TABS
1.0000 | ORAL_TABLET | Freq: Two times a day (BID) | ORAL | Status: DC
  Administered 2014-05-17 – 2014-05-18 (×2): 1 via ORAL
  Filled 2014-05-17 (×2): qty 1

## 2014-05-17 MED ORDER — ONDANSETRON HCL 8 MG PO TABS: 8.0000 mg | ORAL_TABLET | Freq: Three times a day (TID) | ORAL | Status: DC | PRN

## 2014-05-17 MED ORDER — MIDAZOLAM HCL 2 MG/2ML IJ SOLN
INTRAMUSCULAR | Status: AC | PRN
  Administered 2014-05-17 (×3): 1 mg via INTRAVENOUS

## 2014-05-17 MED ORDER — OXYCODONE-ACETAMINOPHEN 5-325 MG PO TABS
1.0000 | ORAL_TABLET | ORAL | Status: DC | PRN
  Administered 2014-05-17: 1 via ORAL
  Administered 2014-05-18: 2 via ORAL
  Administered 2014-05-18: 1 via ORAL
  Filled 2014-05-17 (×2): qty 1
  Filled 2014-05-17: qty 2

## 2014-05-17 MED ORDER — LIDOCAINE-PRILOCAINE 2.5-2.5 % EX CREA: TOPICAL_CREAM | CUTANEOUS | Status: DC

## 2014-05-17 NOTE — Progress Notes (Signed)
PROGRESS NOTE  [name] WLN:[id number] DOB: Oct 04, 1965 DOA: 05/16/2014 PCP: [name], MD  Summary: 49 year old woman diagnosed with metastatic breast cancer by liver biopsy 04/2014 who was seen in the office today at the cancer center in excruciating pain. Direct admission was requested to the hospital service for intractable pain.  Assessment/Plan: 1. Intractable right upper quadrant, rib cage, bony pain secondary to metastatic disease. Now well controlled on morphine PCA. 2. Severe epigastric pain with pleuritic component secondary to metastatic disease. CT intravenous the chest negative for PE. Abdominal radiograph unremarkable, lipase and hepatic function panel unremarkable. No vomiting. Hungry. 3. Stage IV breast cancer with multiple lytic bone lesions throughout the spine, pelvis with diffuse metastatic hepatic disease. 4. Status post Port-A-Cath placement   Overall much improved. Pain is controlled. Tolerating liquids. Ready to advance diet.  Discussed with Dr. Whitney Muse, will convert to oral morphine and if pain controlled, home 1/8.   Discussed with husband at bedside.  Code Status: full code DVT prophylaxis: Lovenox Family Communication:  Disposition Plan: home  Murray Hodgkins, MD  [facility]  Pager [phone] If 7PM-7AM, please contact night-coverage at [email or web address], password [facility] 05/17/2014, 1:14 PM  LOS: 1 day   Consultants:  Oncology   Procedures:  Port-A-Cath placement  Antibiotics:    HPI/Subjective: No issues charted overnight.  Return from procedure late.  Feels much better. Pain very well controlled. Hungry.  Objective: Filed Vitals:   05/17/14 0605 05/17/14 0800 05/17/14 1211 05/17/14 1213  BP: 118/66   125/73  Pulse: 70   80  Temp: 98.8 F (37.1 C)   98.2 F (36.8 C)  TempSrc: Oral   Oral  Resp: 20 14 20    Height:      Weight:      SpO2: 94% 95% 93%     Intake/Output Summary (Last 24 hours) at 05/17/14 1314 Last  data filed at 05/17/14 0800  Gross per 24 hour  Intake    255 ml  Output      0 ml  Net    255 ml     Filed Weights   05/16/14 1739  Weight: 112.492 kg (248 lb)    Exam:     Afebrile, VSS  General:  Appears calm and comfortable Cardiovascular: RRR, no m/r/g. No LE edema. Respiratory: CTA bilaterally, no w/r/r. Normal respiratory effort. Abdomen: soft, mild upper tenderness Psychiatric: grossly normal mood and affect, speech fluent and appropriate  Data Reviewed:  CMP unremarkable except for elevation of AST, ALT secondary to known hepatic disease.  CBC essentially unremarkable, minimal leukocytosis.  Pertinent data: Admission Labs  CMP, lipase unremarkable Imaging   CT angiogram of the chest, no PE. Extensive diffuse metastatic disease noted.  Abdominal series, unremarkable bowel gas pattern.  Scheduled Meds: . dexamethasone  8 mg Oral Q12H  . enoxaparin (LOVENOX) injection  60 mg Subcutaneous Q24H  . feeding supplement (RESOURCE BREEZE)  1 Container Oral TID BM  . morphine   Intravenous 6 times per day  . pantoprazole  40 mg Oral Daily  . polyethylene glycol  17 g Oral Once  . senna-docusate  1 tablet Oral BID  . sertraline  150 mg Oral q morning - 10a  . sodium chloride  3 mL Intravenous Q12H  . sodium chloride  3 mL Intravenous Q12H  . tamoxifen  20 mg Oral Daily   Continuous Infusions:   Principal Problem:   Intractable abdominal pain Active Problems:   Adenocarcinoma determined by biopsy of liver  Breast cancer, stage 4   Time spent 20 minutes

## 2014-05-17 NOTE — Patient Instructions (Addendum)
[place]   CHEMOTHERAPY INSTRUCTIONS  Herceptin - this is given to patients who overexpress HER2. Side Effects that may occur during infusion include: chills, fever, headache, dizziness, shortness of breath, low blood pressure, rash. This does not generally happen. We will have to perform 2D echoes periodically during treatment however because a side effect of this drug can be cardiotoxicity. This drug can weaken the left ventricle (the pumping muscle of the heart). This is why we do the 2D echoes.   Herceptin causes flu-like symptoms in about 40% of the people who take it which includes: fever, chills, muscle aches, nausea. Generally people handle this drug with ease.   Perjeta  - is a HER2-targeted therapy that is designed to search for and attack HER2. This may, in both direct and indirect ways, prevent HER2-overexpressing breast cancer cells from growing. PERJETA works with Herceptin, another HER2-targeted therapy, to fight cancer cells that have too much HER2. Healthy cells also have HER2, and can be affected by HER2-targeted therapies, which may cause serious side effects.  Perjeta can cause reduced heart function or congestive heart failure. We will routinely perform 2D Echoes of your heart to make sure your heart muscle is not weakening.   Most common side effects: feeling tired, nausea, vomiting, low white blood cell count, muscle aches.  You will receive these medications once every 21 days.   Both drugs, the first time you receive them, will take 1.5 hours to infuse per drug. So a total of 3 hours. You will ultimately get down to 30 minutes per infusion by the 3rd infusion.   Taxotere - bone marrow suppression (lowers white blood cells (fight infection), lowers red blood cells (make up your blood), lowers platelets (help blood to clot). This chemo can cause fluid retention. You will be responsible for taking a steroid called Dexamethasone at home  prior to and after Taxotere. This steroid will keep you from having fluid retention. Take it whether you think you need it or not. Can cause hair loss, skin/nail changes (darkening of the nail beds, pain where the nail bed meets the skin, loosening of the nail beds, dry skin, palms of hands and soles of feet may darken or get sensitive, nausea/vomiting, paresthesia (numbness or tingling) in extremities - we need to know if this develops, mucositis (inflammation of any mucosal membrane - the mouth, throat), mouth sores, neurotoxicity (loss of memory, headaches, trouble sleeping, etc.), can also cause excessive tear production. Please let us know if any side effect develops.   Dr. Whitney Muse may or may not actually give this to you on Tuesday when you come in. You will receive it if your abdominal pain is worse or you clinically speaking "look worse". If your pain is better then we will not give this to you. This medication takes 1.5 hours to infuse.   Depo Lupron injections. This medication is to shut down the ovaries and stop them from producing estrogen so there is less estrogen to fuel the growth of hormone-receptor positive breast cancer. Lupron is given once a month for several months or every few months. Side Effects: hot flashes, mood swings, loss of libido, osteoporosis   EDUCATIONAL MATERIALS GIVEN AND REVIEWED: Breast Cancer Book Chemotherapy and You Booklet Specific Instructions Sheets: Herceptin & Perjeta, Tylenol & Benadryl, port-a-cath, 2d echo, chemotherapy, Taxotere, Depo Lupron   SELF CARE ACTIVITIES WHILE ON CHEMOTHERAPY: Increase your fluid intake 48 hours prior to treatment and drink at least 2 quarts  per day after treatment., No alcohol intake., No aspirin or other medications unless approved by your oncologist., Eat foods that are light and easy to digest., Eat foods at cold or room temperature., No fried, fatty, or spicy foods immediately before or after treatment., Have teeth  cleaned professionally before starting treatment. Keep dentures and partial plates clean., Use soft toothbrush and do not use mouthwashes that contain alcohol. Biotene is a good mouthwash that is available at most pharmacies or may be ordered by calling [phone]., Use warm salt water gargles (1 teaspoon salt per 1 quart warm water) before and after meals and at bedtime. Or you may rinse with 2 tablespoons of three -percent hydrogen peroxide mixed in eight ounces of water., Always use sunscreen with SPF (Sun Protection Factor) of 30 or higher., Use your nausea medication as directed to prevent nausea., Use your stool softener or laxative as directed to prevent constipation. and Use your anti-diarrheal medication as directed to stop diarrhea.  Please wash your hands for at least 30 seconds using warm soapy water. Handwashing is the #1 way to prevent the spread of germs. Stay away from sick people or people who are getting over a cold. If you develop respiratory systems such as green/yellow mucus production or productive cough or persistent cough let us know and we will see if you need an antibiotic. It is a good idea to keep a pair of gloves on when going into grocery stores/[employer] to decrease your risk of coming into contact with germs on the carts, etc. Carry alcohol hand gel with you at all times and use it frequently if out in public. All foods need to be cooked thoroughly. No raw foods. No medium or undercooked meats, eggs. If your food is cooked medium well, it does not need to be hot pink or saturated with bloody liquid at all. Vegetables and fruits need to be washed/rinsed under the faucet with a dish detergent before being consumed. You can eat raw fruits and vegetables unless we tell you otherwise but it would be best if you cooked them or bought frozen. Do not eat off of salad bars or hot bars unless you really trust the cleanliness of the restaurant. If you need dental work, please let Dr. Whitney Muse  know before you go for your appointment so that we can coordinate the best possible time for you in regards to your chemo regimen. You need to also let your dentist know that you are actively taking chemo. We may need to do labs prior to your dental appointment. We also want your bowels moving at least every other day. If this is not happening, we need to know so that we can get you on a bowel regimen to help you go.      MEDICATIONS: You have been given prescriptions for the following medications:  Ondansetron/Zofran 70m tablet. Take 1 tablet every 8 hours as needed for nausea/vomiting.   EMLA cream. Apply a quarter size amount of cream to port site 1 hour prior to chemo. Do not rub in. Cover with plastic wrap.   Over-the-Counter Meds:  Senna - this is a mild laxative used to treat mild constipation. May take 1-8 tablets by mouth daily as needed for mild constipation.  Miralax or generic brand. Take 1 capful (17 grams) daily as needed for constipation.  Milk of Magnesia - this is a laxative used to treat moderate to severe constipation. May take 2-4 tablespoons every 8 hours as needed. May increase  to 8 tablespoons x 1 dose and if no bowel movement call the [facility].  Imodium - this is for diarrhea. Take 2 tabs after 1st loose stool and then 1 tab every 2 hours until you go a total of 12 hours without a loose stool. Call [facility] if loose stools continue.   SYMPTOMS TO REPORT AS SOON AS POSSIBLE AFTER TREATMENT:  FEVER GREATER THAN 100.5 F  CHILLS WITH OR WITHOUT FEVER  NAUSEA AND VOMITING THAT IS NOT CONTROLLED WITH YOUR NAUSEA MEDICATION  UNUSUAL SHORTNESS OF BREATH  UNUSUAL BRUISING OR BLEEDING  TENDERNESS IN MOUTH AND THROAT WITH OR WITHOUT PRESENCE OF ULCERS  URINARY PROBLEMS  BOWEL PROBLEMS  UNUSUAL RASH    Wear comfortable clothing and clothing appropriate for easy access to any Portacath or PICC line. Let us know if there is anything that we can do to  make your therapy better!      I have been informed and understand all of the instructions given to me and have received a copy. I have been instructed to call the clinic [phone] or my family physician as soon as possible for continued medical care, if indicated. I do not have any more questions at this time but understand that I may call the [facility] or the Patient Navigator at [phone] during office hours should I have questions or need assistance in obtaining follow-up care.           Implanted Port Insertion An implanted port is a central line that has a round shape and is placed under the skin. It is used as a long-term IV access for:   Medicines, such as chemotherapy.   Fluids.   Liquid nutrition, such as total parenteral nutrition (TPN).   Blood samples.  LET [facility] CARE PROVIDER KNOW ABOUT:  Allergies to food or medicine.   Medicines taken, including vitamins, herbs, eye drops, creams, and over-the-counter medicines.   Any allergies to heparin.  Use of steroids (by mouth or creams).   Previous problems with anesthetics or numbing medicines.   History of bleeding problems or blood clots.   Previous surgery.   Other health problems, including diabetes and kidney problems.   Possibility of pregnancy, if this applies. RISKS AND COMPLICATIONS Generally, this is a safe procedure. However, as with any procedure, problems can occur. Possible problems include:  Damage to the blood vessel, bruising, or bleeding at the puncture site.   Infection.  Blood clot in the vessel that the port is in.  Breakdown of the skin over your port.  Very rarely a person may develop a condition called a pneumothorax, a collection of air in the chest that may cause one of the lungs to collapse. The placement of these catheters with the appropriate imaging guidance significantly decreases the risk of a pneumothorax.  BEFORE THE PROCEDURE   Your  health care provider may want you to have blood tests. These tests can help tell how well your kidneys and liver are working. They can also show how well your blood clots.   If you take blood thinners (anticoagulant medicines), ask your health care provider when you should stop taking them.   Make arrangements for someone to drive you home. This is necessary if you have been sedated for your procedure.  PROCEDURE  Port insertion usually takes about 30-45 minutes.   An IV needle will be inserted in your arm. Medicine for pain and medicine to help relax you (sedative) will  flow directly into your body through this needle.   You will lie on an exam table, and you will be connected to monitors to keep track of your heart rate, blood pressure, and breathing throughout the procedure.  An oxygen monitoring device may be attached to your finger. Oxygen will be given.   Everything will be kept as germ free (sterile) as possible during the procedure. The skin near the point of the incision will be cleansed with antiseptic, and the area will be draped with sterile towels. The skin and deeper tissues over the port area will be made numb with a local anesthetic.  Two small cuts (incisions) will be made in the skin to insert the port. One will be made in the neck to obtain access to the vein where the catheter will lie.   Because the port reservoir will be placed under the skin, a small skin incision will be made in the upper chest, and a small pocket for the port will be made under the skin. The catheter that will be connected to the port tunnels to a large central vein in the chest. A small, raised area will remain on your body at the site of the reservoir when the procedure is complete.  The port placement will be done under imaging guidance to ensure the proper placement.  The reservoir has a silicone covering that can be punctured with a special needle.   The port will be flushed with normal  saline, and blood will be drawn to make sure it is working properly.  There will be nothing remaining outside the skin when the procedure is finished.   Incisions will be held together by stitches, surgical glue, or a special tape. AFTER THE PROCEDURE  You will stay in a recovery area until the anesthesia has worn off. Your blood pressure and pulse will be checked.  A final chest X-ray will be taken to check the placement of the port and to ensure that there is no injury to your lung. Document Released: 02/15/2013 Document Revised: 09/11/2013 Document Reviewed: 02/15/2013 [facility] Patient Information 2015 [place], [place]. This information is not intended to replace advice given to you by your health care provider. Make sure you discuss any questions you have with your health care provider. Echocardiogram An echocardiogram, or echocardiography, uses sound waves (ultrasound) to produce an image of your heart. The echocardiogram is simple, painless, obtained within a short period of time, and offers valuable information to your health care provider. The images from an echocardiogram can provide information such as:  Evidence of coronary artery disease (CAD).  Heart size.  Heart muscle function.  Heart valve function.  Aneurysm detection.  Evidence of a past heart attack.  Fluid buildup around the heart.  Heart muscle thickening.  Assess heart valve function. LET [facility] CARE PROVIDER KNOW ABOUT:  Any allergies you have.  All medicines you are taking, including vitamins, herbs, eye drops, creams, and over-the-counter medicines.  Previous problems you or members of your family have had with the use of anesthetics.  Any blood disorders you have.  Previous surgeries you have had.  Medical conditions you have.  Possibility of pregnancy, if this applies. BEFORE THE PROCEDURE  No special preparation is needed. Eat and drink normally.  PROCEDURE   In order to produce an  image of your heart, gel will be applied to your chest and a wand-like tool (transducer) will be moved over your chest. The gel will help transmit the sound  waves from the transducer. The sound waves will harmlessly bounce off your heart to allow the heart images to be captured in real-time motion. These images will then be recorded.  You may need an IV to receive a medicine that improves the quality of the pictures. AFTER THE PROCEDURE You may return to your normal schedule including diet, activities, and medicines, unless your health care provider tells you otherwise. Document Released: 04/24/2000 Document Revised: 09/11/2013 Document Reviewed: 01/02/2013 [facility] Patient Information 2015 [place], [place]. This information is not intended to replace advice given to you by your health care provider. Make sure you discuss any questions you have with your health care provider. Trastuzumab injection for infusion What is this medicine? TRASTUZUMAB (tras TOO zoo mab) is a monoclonal antibody. It targets a protein called HER2. This protein is found in some stomach and breast cancers. This medicine can stop cancer cell growth. This medicine may be used with other cancer treatments. This medicine may be used for other purposes; ask your health care provider or pharmacist if you have questions. COMMON BRAND NAME(S): Herceptin What should I tell my health care provider before I take this medicine? They need to know if you have any of these conditions: -heart disease -heart failure -infection (especially a virus infection such as chickenpox, cold sores, or herpes) -lung or breathing disease, like asthma -recent or ongoing radiation therapy -an unusual or allergic reaction to trastuzumab, benzyl alcohol, or other medications, foods, dyes, or preservatives -pregnant or trying to get pregnant -breast-feeding How should I use this medicine? This drug is given as an infusion into a vein. It is administered in  a hospital or clinic by a specially trained health care professional. Talk to your pediatrician regarding the use of this medicine in children. This medicine is not approved for use in children. Overdosage: If you think you have taken too much of this medicine contact a poison control center or emergency room at once. NOTE: This medicine is only for you. Do not share this medicine with others. What if I miss a dose? It is important not to miss a dose. Call your doctor or health care professional if you are unable to keep an appointment. What may interact with this medicine? -cyclophosphamide -doxorubicin -warfarin This list may not describe all possible interactions. Give your health care provider a list of all the medicines, herbs, non-prescription drugs, or dietary supplements you use. Also tell them if you smoke, drink alcohol, or use illegal drugs. Some items may interact with your medicine. What should I watch for while using this medicine? Visit your doctor for checks on your progress. Report any side effects. Continue your course of treatment even though you feel ill unless your doctor tells you to stop. Call your doctor or health care professional for advice if you get a fever, chills or sore throat, or other symptoms of a cold or flu. Do not treat yourself. Try to avoid being around people who are sick. You may experience fever, chills and shaking during your first infusion. These effects are usually mild and can be treated with other medicines. Report any side effects during the infusion to your health care professional. Fever and chills usually do not happen with later infusions. What side effects may I notice from receiving this medicine? Side effects that you should report to your doctor or other health care professional as soon as possible: -breathing difficulties -chest pain or palpitations -cough -dizziness or fainting -fever or chills, sore throat -skin rash,  itching or  hives -swelling of the legs or ankles -unusually weak or tired Side effects that usually do not require medical attention (report to your doctor or other health care professional if they continue or are bothersome): -loss of appetite -headache -muscle aches -nausea This list may not describe all possible side effects. Call your doctor for medical advice about side effects. You may report side effects to FDA at 1-800-FDA-1088. Where should I keep my medicine? This drug is given in a hospital or clinic and will not be stored at home. NOTE: This sheet is a summary. It may not cover all possible information. If you have questions about this medicine, talk to your doctor, pharmacist, or health care provider.  2015, [employer]. (2009-03-01 13:43:15) Pertuzumab injection What is this medicine? PERTUZUMAB (per TOOZ ue mab) is a monoclonal antibody that targets a protein called HER2. HER2 is found in some breast cancers. This medicine can stop cancer cell growth. This medicine is used with other cancer treatments. This medicine may be used for other purposes; ask your health care provider or pharmacist if you have questions. COMMON BRAND NAME(S): PERJETA What should I tell my health care provider before I take this medicine? They need to know if you have any of these conditions: -heart disease -heart failure -high blood pressure -history of irregular heart beat -recent or ongoing radiation therapy -an unusual or allergic reaction to pertuzumab, other medicines, foods, dyes, or preservatives -pregnant or trying to get pregnant -breast-feeding How should I use this medicine? This medicine is for infusion into a vein. It is given by a health care professional in a hospital or clinic setting. Talk to your pediatrician regarding the use of this medicine in children. Special care may be needed. Overdosage: If you think you've taken too much of this medicine contact a poison control  center or emergency room at once. Overdosage: If you think you have taken too much of this medicine contact a poison control center or emergency room at once. NOTE: This medicine is only for you. Do not share this medicine with others. What if I miss a dose? It is important not to miss your dose. Call your doctor or health care professional if you are unable to keep an appointment. What may interact with this medicine? Interactions are not expected. Give your health care provider a list of all the medicines, herbs, non-prescription drugs, or dietary supplements you use. Also tell them if you smoke, drink alcohol, or use illegal drugs. Some items may interact with your medicine. This list may not describe all possible interactions. Give your health care provider a list of all the medicines, herbs, non-prescription drugs, or dietary supplements you use. Also tell them if you smoke, drink alcohol, or use illegal drugs. Some items may interact with your medicine. What should I watch for while using this medicine? Your condition will be monitored carefully while you are receiving this medicine. Report any side effects. Continue your course of treatment even though you feel ill unless your doctor tells you to stop. Do not become pregnant while taking this medicine. Women should inform their doctor if they wish to become pregnant or think they might be pregnant. There is a potential for serious side effects to an unborn child. Talk to your health care professional or pharmacist for more information. Do not breast-feed an infant while taking this medicine. Call your doctor or health care professional for advice if you get a fever, chills or sore throat, or  other symptoms of a cold or flu. Do not treat yourself. Try to avoid being around people who are sick. You may experience fever, chills, and headache during the infusion. Report any side effects during the infusion to your health care professional. What side  effects may I notice from receiving this medicine? Side effects that you should report to your doctor or health care professional as soon as possible: -breathing problems -chest pain or palpitations -dizziness -feeling faint or lightheaded -fever or chills -skin rash, itching or hives -sore throat -swelling of the face, lips, or tongue -swelling of the legs or ankles -unusually weak or tired Side effects that usually do not require medical attention (Report these to your doctor or health care professional if they continue or are bothersome.): -diarrhea -hair loss -nausea, vomiting -tiredness This list may not describe all possible side effects. Call your doctor for medical advice about side effects. You may report side effects to FDA at 1-800-FDA-1088. Where should I keep my medicine? This drug is given in a hospital or clinic and will not be stored at home. NOTE: This sheet is a summary. It may not cover all possible information. If you have questions about this medicine, talk to your doctor, pharmacist, or health care provider.  2015, [employer] Standard. (2012-02-24 16:54:15) Acetaminophen tablets or caplets What is this medicine? ACETAMINOPHEN (a set a MEE noe fen) is a pain reliever. It is used to treat mild pain and fever. This medicine may be used for other purposes; ask your health care provider or pharmacist if you have questions. COMMON BRAND NAME(S): Aceta, Actamin, Anacin Aspirin Free, Genapap, Genebs, Mapap, Pain & Fever, Pain and Fever, PAIN RELIEF, PAIN RELIEF Extra Strength, Pain Reliever, Panadol, PHARBETOL, Q-Pap, Q-Pap Extra Strength, Tylenol, Tylenol CrushableTablet, Tylenol Extra Strength, XS No Aspirin, XS Pain Reliever What should I tell my health care provider before I take this medicine? They need to know if you have any of these conditions: -if you often drink alcohol -liver disease -an unusual or allergic reaction to acetaminophen, other medicines, foods,  dyes, or preservatives -pregnant or trying to get pregnant -breast-feeding How should I use this medicine? Take this medicine by mouth with a glass of water. Follow the directions on the package or prescription label. Take your medicine at regular intervals. Do not take your medicine more often than directed. Talk to your pediatrician regarding the use of this medicine in children. While this drug may be prescribed for children as young as 79 years of age for selected conditions, precautions do apply. Overdosage: If you think you have taken too much of this medicine contact a poison control center or emergency room at once. NOTE: This medicine is only for you. Do not share this medicine with others. What if I miss a dose? If you miss a dose, take it as soon as you can. If it is almost time for your next dose, take only that dose. Do not take double or extra doses. What may interact with this medicine? -alcohol -imatinib -isoniazid -other medicines with acetaminophen This list may not describe all possible interactions. Give your health care provider a list of all the medicines, herbs, non-prescription drugs, or dietary supplements you use. Also tell them if you smoke, drink alcohol, or use illegal drugs. Some items may interact with your medicine. What should I watch for while using this medicine? Tell your doctor or health care professional if the pain lasts more than 10 days (5 days for children), if it  gets worse, or if there is a new or different kind of pain. Also, check with your doctor if a fever lasts for more than 3 days. Do not take other medicines that contain acetaminophen with this medicine. Always read labels carefully. If you have questions, ask your doctor or pharmacist. If you take too much acetaminophen get medical help right away. Too much acetaminophen can be very dangerous and cause liver damage. Even if you do not have symptoms, it is important to get help right away. What  side effects may I notice from receiving this medicine? Side effects that you should report to your doctor or health care professional as soon as possible: -allergic reactions like skin rash, itching or hives, swelling of the face, lips, or tongue -breathing problems -fever or sore throat -redness, blistering, peeling or loosening of the skin, including inside the mouth -trouble passing urine or change in the amount of urine -unusual bleeding or bruising -unusually weak or tired -yellowing of the eyes or skin Side effects that usually do not require medical attention (report to your doctor or health care professional if they continue or are bothersome): -headache -nausea, stomach upset This list may not describe all possible side effects. Call your doctor for medical advice about side effects. You may report side effects to FDA at 1-800-FDA-1088. Where should I keep my medicine? Keep out of reach of children. Store at room temperature between 20 and 25 degrees C (68 and 77 degrees F). Protect from moisture and heat. Throw away any unused medicine after the expiration date. NOTE: This sheet is a summary. It may not cover all possible information. If you have questions about this medicine, talk to your doctor, pharmacist, or health care provider.  2015, [employer]. (2012-12-19 12:54:16) Diphenhydramine capsules or tablets What is this medicine? DIPHENHYDRAMINE (dye fen HYE dra meen) is an antihistamine. It is used to treat the symptoms of an allergic reaction. It is also used to treat Parkinson's disease. This medicine is also used to prevent and to treat motion sickness and as a nighttime sleep aid. This medicine may be used for other purposes; ask your health care provider or pharmacist if you have questions. COMMON BRAND NAME(S): Alka-Seltzer Plus Allergy, Banophen, Benadryl Allergy, Benadryl Allergy Dye Free, Benadryl Allergy Kapgel, Benadryl Allergy Ultratab, Diphedryl,  Diphenhist, Genahist, PHARBEDRYL, Q-Dryl, Gretta Began, Valu-Dryl, Vicks ZzzQuil Nightime Sleep-Aid What should I tell my health care provider before I take this medicine? They need to know if you have any of these conditions: -asthma or lung disease -glaucoma -high blood pressure or heart disease -liver disease -pain or difficulty passing urine -prostate trouble -ulcers or other stomach problems -an unusual or allergic reaction to diphenhydramine, other medicines foods, dyes, or preservatives such as sulfites -pregnant or trying to get pregnant -breast-feeding How should I use this medicine? Take this medicine by mouth with a full glass of water. Follow the directions on the prescription label. Take your doses at regular intervals. Do not take your medicine more often than directed. To prevent motion sickness start taking this medicine 30 to 60 minutes before you leave. Talk to your pediatrician regarding the use of this medicine in children. Special care may be needed. Patients over 45 years old may have a stronger reaction and need a smaller dose. Overdosage: If you think you have taken too much of this medicine contact a poison control center or emergency room at once. NOTE: This medicine is only for you. Do not share this medicine with  others. What if I miss a dose? If you miss a dose, take it as soon as you can. If it is almost time for your next dose, take only that dose. Do not take double or extra doses. What may interact with this medicine? Do not take this medicine with any of the following medications: -MAOIs like Carbex, Eldepryl, Marplan, Nardil, and Parnate This medicine may also interact with the following medications: -alcohol -barbiturates, like phenobarbital -medicines for bladder spasm like oxybutynin, tolterodine -medicines for blood pressure -medicines for depression, anxiety, or psychotic disturbances -medicines for movement abnormalities or Parkinson's  disease -medicines for sleep -other medicines for cold, cough or allergy -some medicines for the stomach like chlordiazepoxide, dicyclomine This list may not describe all possible interactions. Give your health care provider a list of all the medicines, herbs, non-prescription drugs, or dietary supplements you use. Also tell them if you smoke, drink alcohol, or use illegal drugs. Some items may interact with your medicine. What should I watch for while using this medicine? Visit your doctor or health care professional for regular check ups. Tell your doctor if your symptoms do not improve or if they get worse. Your mouth may get dry. Chewing sugarless gum or sucking hard candy, and drinking plenty of water may help. Contact your doctor if the problem does not go away or is severe. This medicine may cause dry eyes and blurred vision. If you wear contact lenses you may feel some discomfort. Lubricating drops may help. See your eye doctor if the problem does not go away or is severe. You may get drowsy or dizzy. Do not drive, use machinery, or do anything that needs mental alertness until you know how this medicine affects you. Do not stand or sit up quickly, especially if you are an older patient. This reduces the risk of dizzy or fainting spells. Alcohol may interfere with the effect of this medicine. Avoid alcoholic drinks. What side effects may I notice from receiving this medicine? Side effects that you should report to your doctor or health care professional as soon as possible: -allergic reactions like skin rash, itching or hives, swelling of the face, lips, or tongue -changes in vision -confused, agitated, nervous -irregular or fast heartbeat -tremor -trouble passing urine -unusual bleeding or bruising -unusually weak or tired Side effects that usually do not require medical attention (report to your doctor or health care professional if they continue or are bothersome): -constipation,  diarrhea -drowsy -headache -loss of appetite -stomach upset, vomiting -thick mucous This list may not describe all possible side effects. Call your doctor for medical advice about side effects. You may report side effects to FDA at 1-800-FDA-1088. Where should I keep my medicine? Keep out of the reach of children. Store at room temperature between 15 and 30 degrees C (59 and 86 degrees F). Keep container closed tightly. Throw away any unused medicine after the expiration date. NOTE: This sheet is a summary. It may not cover all possible information. If you have questions about this medicine, talk to your doctor, pharmacist, or health care provider.  2015, [employer]. (2007-08-15 17:06:22) Chemotherapy Many people are apprehensive about chemotherapy due to concerns over uncomfortable side effects. However, managements for side effects have come a long way. Many side effects once associated with chemotherapy can be prevented and/or controlled. WHAT IS CHEMOTHERAPY? Chemotherapy is the general term for any treatment involving the use of chemical agents. Chemotherapy can be given through a vein, most commonly through an implanted port*  or PICC line.* It can also be delivered by mouth (orally) in the form of a pill. The main goal of chemotherapy is to kill cancer cells and stop them from growing. It can destroy and eliminate cancer cells where the cancer started (primary tumor location) and throughout the body, often far away from the original cancer. It is a treatment that not only targets the original cancer location, but also the entire body (systemic treatment) for full effect and results. Chemotherapy works by destroying cancer cells. Unfortunately, it cannot tell the difference between a cancer cell and some healthy cells. This results in the death of noncancerous cells, such as hair and blood cells. Harm to healthy cells is what causes side effects. These cells usually repair  themselves after chemotherapy. Because some drugs work better together rather than alone, 2 or more drugs are often given at the same time. This is called combination chemotherapy. Depending on the type of cancer and how advanced it is, chemotherapy can be used for different goals:  Cure the cancer.  Keep the cancer from spreading.  Slow the cancer's growth.  Kill cancer cells that may have spread to other parts of the body from the original tumor.  Relieve symptoms caused by cancer. You and your caregiver will decide what drug or combination of drugs you will get. Your caregiver will choose the doses, how the drugs will be given, how often, and how long you will get treatment. All of these decisions will depend on the type of cancer, where it is, how big it is, and how it is affecting your normal body functions and overall health. *Implanted port - A device that is implanted under your skin so that medicines may be delivered directly into your blood system. *PICC line (peripherally inserted central catheter) - A long, slender, flexible tube. This tube is often inserted into a vein, typically in the upper arm. The tip stops in the large central vein that leads to your heart. Document Released: 02/22/2007 Document Revised: 07/20/2011 Document Reviewed: 08/09/2008 [facility] Patient Information 2015 [place], [place]. This information is not intended to replace advice given to you by your health care provider. Make sure you discuss any questions you have with your health care provider. Ondansetron tablets What is this medicine? ONDANSETRON (on DAN se tron) is used to treat nausea and vomiting caused by chemotherapy. It is also used to prevent or treat nausea and vomiting after surgery. This medicine may be used for other purposes; ask your health care provider or pharmacist if you have questions. COMMON BRAND NAME(S): Zofran What should I tell my health care provider before I take this  medicine? They need to know if you have any of these conditions: -heart disease -history of irregular heartbeat -liver disease -low levels of magnesium or potassium in the blood -an unusual or allergic reaction to ondansetron, granisetron, other medicines, foods, dyes, or preservatives -pregnant or trying to get pregnant -breast-feeding How should I use this medicine? Take this medicine by mouth with a glass of water. Follow the directions on your prescription label. Take your doses at regular intervals. Do not take your medicine more often than directed. Talk to your pediatrician regarding the use of this medicine in children. Special care may be needed. Overdosage: If you think you have taken too much of this medicine contact a poison control center or emergency room at once. NOTE: This medicine is only for you. Do not share this medicine with others. What if I miss a  dose? If you miss a dose, take it as soon as you can. If it is almost time for your next dose, take only that dose. Do not take double or extra doses. What may interact with this medicine? Do not take this medicine with any of the following medications: -apomorphine -certain medicines for fungal infections like fluconazole, itraconazole, ketoconazole, posaconazole, voriconazole -cisapride -dofetilide -dronedarone -pimozide -thioridazine -ziprasidone This medicine may also interact with the following medications: -carbamazepine -certain medicines for depression, anxiety, or psychotic disturbances -fentanyl -linezolid -MAOIs like Carbex, Eldepryl, Marplan, Nardil, and Parnate -methylene blue (injected into a vein) -other medicines that prolong the QT interval (cause an abnormal heart rhythm) -phenytoin -rifampicin -tramadol This list may not describe all possible interactions. Give your health care provider a list of all the medicines, herbs, non-prescription drugs, or dietary supplements you use. Also tell them if  you smoke, drink alcohol, or use illegal drugs. Some items may interact with your medicine. What should I watch for while using this medicine? Check with your doctor or health care professional right away if you have any sign of an allergic reaction. What side effects may I notice from receiving this medicine? Side effects that you should report to your doctor or health care professional as soon as possible: -allergic reactions like skin rash, itching or hives, swelling of the face, lips or tongue -breathing problems -confusion -dizziness -fast or irregular heartbeat -feeling faint or lightheaded, falls -fever and chills -loss of balance or coordination -seizures -sweating -swelling of the hands or feet -tightness in the chest -tremors -unusually weak or tired Side effects that usually do not require medical attention (report to your doctor or health care professional if they continue or are bothersome): -constipation or diarrhea -headache This list may not describe all possible side effects. Call your doctor for medical advice about side effects. You may report side effects to FDA at 1-800-FDA-1088. Where should I keep my medicine? Keep out of the reach of children. Store between 2 and 30 degrees C (36 and 86 degrees F). Throw away any unused medicine after the expiration date. NOTE: This sheet is a summary. It may not cover all possible information. If you have questions about this medicine, talk to your doctor, pharmacist, or health care provider.  2015, [employer]vier/[employer] Standard. (2013-02-01 16:27:45) Docetaxel injection What is this medicine? DOCETAXEL (doe se TAX el) is a chemotherapy drug. It targets fast dividing cells, like cancer cells, and causes these cells to die. This medicine is used to treat many types of cancers like breast cancer, certain stomach cancers, head and neck cancer, lung cancer, and prostate cancer. This medicine may be used for other purposes; ask your  health care provider or pharmacist if you have questions. COMMON BRAND NAME(S): Docefrez, Taxotere What should I tell my health care provider before I take this medicine? They need to know if you have any of these conditions: -infection (especially a virus infection such as chickenpox, cold sores, or herpes) -liver disease -low blood counts, like low white cell, platelet, or red cell counts -an unusual or allergic reaction to docetaxel, polysorbate 80, other chemotherapy agents, other medicines, foods, dyes, or preservatives -pregnant or trying to get pregnant -breast-feeding How should I use this medicine? This drug is given as an infusion into a vein. It is administered in a hospital or clinic by a specially trained health care professional. Talk to your pediatrician regarding the use of this medicine in children. Special care may be needed. Overdosage: If you  think you have taken too much of this medicine contact a poison control center or emergency room at once. NOTE: This medicine is only for you. Do not share this medicine with others. What if I miss a dose? It is important not to miss your dose. Call your doctor or health care professional if you are unable to keep an appointment. What may interact with this medicine? -cyclosporine -erythromycin -ketoconazole -medicines to increase blood counts like filgrastim, pegfilgrastim, sargramostim -vaccines Talk to your doctor or health care professional before taking any of these medicines: -acetaminophen -aspirin -ibuprofen -ketoprofen -naproxen This list may not describe all possible interactions. Give your health care provider a list of all the medicines, herbs, non-prescription drugs, or dietary supplements you use. Also tell them if you smoke, drink alcohol, or use illegal drugs. Some items may interact with your medicine. What should I watch for while using this medicine? Your condition will be monitored carefully while you are  receiving this medicine. You will need important blood work done while you are taking this medicine. This drug may make you feel generally unwell. This is not uncommon, as chemotherapy can affect healthy cells as well as cancer cells. Report any side effects. Continue your course of treatment even though you feel ill unless your doctor tells you to stop. In some cases, you may be given additional medicines to help with side effects. Follow all directions for their use. Call your doctor or health care professional for advice if you get a fever, chills or sore throat, or other symptoms of a cold or flu. Do not treat yourself. This drug decreases your body's ability to fight infections. Try to avoid being around people who are sick. This medicine may increase your risk to bruise or bleed. Call your doctor or health care professional if you notice any unusual bleeding. Be careful brushing and flossing your teeth or using a toothpick because you may get an infection or bleed more easily. If you have any dental work done, tell your dentist you are receiving this medicine. Avoid taking products that contain aspirin, acetaminophen, ibuprofen, naproxen, or ketoprofen unless instructed by your doctor. These medicines may hide a fever. This medicine contains an alcohol in the product. You may get drowsy or dizzy. Do not drive, use machinery, or do anything that needs mental alertness until you know how this medicine affects you. Do not stand or sit up quickly, especially if you are an older patient. This reduces the risk of dizzy or fainting spells. Avoid alcoholic drinks Do not become pregnant while taking this medicine. Women should inform their doctor if they wish to become pregnant or think they might be pregnant. There is a potential for serious side effects to an unborn child. Talk to your health care professional or pharmacist for more information. Do not breast-feed an infant while taking this medicine. What  side effects may I notice from receiving this medicine? Side effects that you should report to your doctor or health care professional as soon as possible: -allergic reactions like skin rash, itching or hives, swelling of the face, lips, or tongue -low blood counts - This drug may decrease the number of white blood cells, red blood cells and platelets. You may be at increased risk for infections and bleeding. -signs of infection - fever or chills, cough, sore throat, pain or difficulty passing urine -signs of decreased platelets or bleeding - bruising, pinpoint red spots on the skin, black, tarry stools, nosebleeds -signs of decreased red  blood cells - unusually weak or tired, fainting spells, lightheadedness -breathing problems -fast or irregular heartbeat -low blood pressure -mouth sores -nausea and vomiting -pain, swelling, redness or irritation at the injection site -pain, tingling, numbness in the hands or feet -swelling of the ankle, feet, hands -weight gain Side effects that usually do not require medical attention (report to your prescriber or health care professional if they continue or are bothersome): -bone pain -complete hair loss including hair on your head, underarms, pubic hair, eyebrows, and eyelashes -diarrhea -excessive tearing -changes in the color of fingernails -loosening of the fingernails -nausea -muscle pain -red flush to skin -sweating -weak or tired This list may not describe all possible side effects. Call your doctor for medical advice about side effects. You may report side effects to FDA at 1-800-FDA-1088. Where should I keep my medicine? This drug is given in a hospital or clinic and will not be stored at home. NOTE: This sheet is a summary. It may not cover all possible information. If you have questions about this medicine, talk to your doctor, pharmacist, or health care provider.  2015, [employer] Standard. (2013-03-23 22:21:02) Ondansetron  injection What is this medicine? ONDANSETRON (on DAN se tron) is used to treat nausea and vomiting caused by chemotherapy. It is also used to prevent or treat nausea and vomiting after surgery. This medicine may be used for other purposes; ask your health care provider or pharmacist if you have questions. COMMON BRAND NAME(S): Zofran What should I tell my health care provider before I take this medicine? They need to know if you have any of these conditions: -heart disease -history of irregular heartbeat -liver disease -low levels of magnesium or potassium in the blood -an unusual or allergic reaction to ondansetron, granisetron, other medicines, foods, dyes, or preservatives -pregnant or trying to get pregnant -breast-feeding How should I use this medicine? This medicine is for infusion into a vein. It is given by a health care professional in a hospital or clinic setting. Talk to your pediatrician regarding the use of this medicine in children. Special care may be needed. Overdosage: If you think you have taken too much of this medicine contact a poison control center or emergency room at once. NOTE: This medicine is only for you. Do not share this medicine with others. What if I miss a dose? This does not apply. What may interact with this medicine? Do not take this medicine with any of the following medications: -apomorphine -certain medicines for fungal infections like fluconazole, itraconazole, ketoconazole, posaconazole, voriconazole -cisapride -dofetilide -dronedarone -pimozide -thioridazine -ziprasidone This medicine may also interact with the following medications: -carbamazepine -certain medicines for depression, anxiety, or psychotic disturbances -fentanyl -linezolid -MAOIs like Carbex, Eldepryl, Marplan, Nardil, and Parnate -methylene blue (injected into a vein) -other medicines that prolong the QT interval (cause an abnormal heart  rhythm) -phenytoin -rifampicin -tramadol This list may not describe all possible interactions. Give your health care provider a list of all the medicines, herbs, non-prescription drugs, or dietary supplements you use. Also tell them if you smoke, drink alcohol, or use illegal drugs. Some items may interact with your medicine. What should I watch for while using this medicine? Your condition will be monitored carefully while you are receiving this medicine. What side effects may I notice from receiving this medicine? Side effects that you should report to your doctor or health care professional as soon as possible: -allergic reactions like skin rash, itching or hives, swelling of the face, lips, or  tongue -breathing problems -confusion -dizziness -fast or irregular heartbeat -feeling faint or lightheaded, falls -fever and chills -loss of balance or coordination -seizures -sweating -swelling of the hands and feet -tightness in the chest -tremors -unusually weak or tired Side effects that usually do not require medical attention (report to your doctor or health care professional if they continue or are bothersome): -constipation or diarrhea -headache This list may not describe all possible side effects. Call your doctor for medical advice about side effects. You may report side effects to FDA at 1-800-FDA-1088. Where should I keep my medicine? This drug is given in a hospital or clinic and will not be stored at home. NOTE: This sheet is a summary. It may not cover all possible information. If you have questions about this medicine, talk to your doctor, pharmacist, or health care provider.  2015, [employer] Standard. (2013-02-01 16:18:28) Dexamethasone injection What is this medicine? DEXAMETHASONE (dex a METH a sone) is a corticosteroid. It is used to treat inflammation of the skin, joints, lungs, and other organs. Common conditions treated include asthma, allergies, and arthritis.  It is also used for other conditions, like blood disorders and diseases of the adrenal glands. This medicine may be used for other purposes; ask your health care provider or pharmacist if you have questions. COMMON BRAND NAME(S): Decadron, Solurex What should I tell my health care provider before I take this medicine? They need to know if you have any of these conditions: -blood clotting problems -Cushing's syndrome -diabetes -glaucoma -heart problems or disease -high blood pressure -infection like herpes, measles, tuberculosis, or chickenpox -kidney disease -liver disease -mental problems -myasthenia gravis -osteoporosis -previous heart attack -seizures -stomach, ulcer or intestine disease including colitis and diverticulitis -thyroid problem -an unusual or allergic reaction to dexamethasone, corticosteroids, other medicines, lactose, foods, dyes, or preservatives -pregnant or trying to get pregnant -breast-feeding How should I use this medicine? This medicine is for injection into a muscle, joint, lesion, soft tissue, or vein. It is given by a health care professional in a hospital or clinic setting. Talk to your pediatrician regarding the use of this medicine in children. Special care may be needed. Overdosage: If you think you have taken too much of this medicine contact a poison control center or emergency room at once. NOTE: This medicine is only for you. Do not share this medicine with others. What if I miss a dose? This may not apply. If you are having a series of injections over a prolonged period, try not to miss an appointment. Call your doctor or health care professional to reschedule if you are unable to keep an appointment. What may interact with this medicine? Do not take this medicine with any of the following medications: -mifepristone, RU-486 -vaccines This medicine may also interact with the following medications: -amphotericin B -antibiotics like  clarithromycin, erythromycin, and troleandomycin -aspirin and aspirin-like drugs -barbiturates like phenobarbital -carbamazepine -cholestyramine -cholinesterase inhibitors like donepezil, galantamine, rivastigmine, and tacrine -cyclosporine -digoxin -diuretics -ephedrine -female hormones, like estrogens or progestins and birth control pills -indinavir -isoniazid -ketoconazole -medicines for diabetes -medicines that improve muscle tone or strength for conditions like myasthenia gravis -NSAIDs, medicines for pain and inflammation, like ibuprofen or naproxen -phenytoin -rifampin -thalidomide -warfarin This list may not describe all possible interactions. Give your health care provider a list of all the medicines, herbs, non-prescription drugs, or dietary supplements you use. Also tell them if you smoke, drink alcohol, or use illegal drugs. Some items may interact with your medicine. What should  I watch for while using this medicine? Your condition will be monitored carefully while you are receiving this medicine. If you are taking this medicine for a long time, carry an identification card with your name and address, the type and dose of your medicine, and your doctor's name and address. This medicine may increase your risk of getting an infection. Stay away from people who are sick. Tell your doctor or health care professional if you are around anyone with measles or chickenpox. Talk to your health care provider before you get any vaccines that you take this medicine. If you are going to have surgery, tell your doctor or health care professional that you have taken this medicine within the last twelve months. Ask your doctor or health care professional about your diet. You may need to lower the amount of salt you eat. The medicine can increase your blood sugar. If you are a diabetic check with your doctor if you need help adjusting the dose of your diabetic medicine. What side effects may  I notice from receiving this medicine? Side effects that you should report to your doctor or health care professional as soon as possible: -allergic reactions like skin rash, itching or hives, swelling of the face, lips, or tongue -black or tarry stools -change in the amount of urine -changes in vision -confusion, excitement, restlessness, a false sense of well-being -fever, sore throat, sneezing, cough, or other signs of infection, wounds that will not heal -hallucinations -increased thirst -mental depression, mood swings, mistaken feelings of self importance or of being mistreated -pain in hips, back, ribs, arms, shoulders, or legs -pain, redness, or irritation at the injection site -redness, blistering, peeling or loosening of the skin, including inside the mouth -rounding out of face -swelling of feet or lower legs -unusual bleeding or bruising -unusual tired or weak -wounds that do not heal Side effects that usually do not require medical attention (report to your doctor or health care professional if they continue or are bothersome): -diarrhea or constipation -change in taste -headache -nausea, vomiting -skin problems, acne, thin and shiny skin -touble sleeping -unusual growth of hair on the face or body -weight gain This list may not describe all possible side effects. Call your doctor for medical advice about side effects. You may report side effects to FDA at 1-800-FDA-1088. Where should I keep my medicine? This drug is given in a hospital or clinic and will not be stored at home. NOTE: This sheet is a summary. It may not cover all possible information. If you have questions about this medicine, talk to your doctor, pharmacist, or health care provider.  2015, [employer]. (2007-08-18 14:04:12)

## 2014-05-17 NOTE — Progress Notes (Signed)
Nutrition Brief Note  Patient identified on the Malnutrition Screening Tool (MST) Report  Wt Readings from Last 15 Encounters:  05/16/14 248 lb (112.492 kg)  05/09/14 248 lb 1.6 oz (112.537 kg)  04/23/14 244 lb (110.678 kg)  07/13/13 230 lb (104.327 kg)  03/12/11 231 lb (104.2 kg)   49 year old woman diagnosed with metastatic breast cancer by liver biopsy 04/2014 who was seen in the office today at the cancer center in excruciating pain. Direct admission was requested to the hospital service for intractable pain.  UBW of 230#. Noted wt gain trend over the past year. Currently taking clear liquid diet well.   Body mass index is 37.16 kg/(m^2). Patient meets criteria for obesity, class I based on current BMI.   Current diet order is clear liquid, patient is consuming approximately 75% of meals at this time. Labs and medications reviewed.   No nutrition interventions warranted at this time. If nutrition issues arise, please consult RD.   Other Atienza A. Jimmye Norman, RD, LDN, CDE Pager: [phone]

## 2014-05-17 NOTE — Progress Notes (Signed)
UR chart review completed.  

## 2014-05-17 NOTE — Progress Notes (Signed)
Report called to J. Mays RN at [facility] by Georgena Spurling RN. Patient transported to [facility] by [facility] after port a cath placed in IR.

## 2014-05-17 NOTE — Procedures (Signed)
Interventional Radiology Procedure Note  Procedure: Placement of a right IJ approach single lumen PowerPort.  Tip is positioned at the superior cavoatrial junction and catheter is ready for immediate use.  Complications: No immediate Recommendations:  - Ok to shower tomorrow - Do not submerge for 7 days - Routine line care   Signed,  Mariaisabel Bodiford S. Malissa Slay, DO    

## 2014-05-17 NOTE — Progress Notes (Signed)
Pt wanted to transition to PO pain medication and discontinue the PCA pump. [facility] paged and orders received for PO pain medicine. Discussed with patient that we were going to try PO pain medication to see if she was able to relieve her pain. We would leave the PCA on for now to have in case PO medication did not control pain. Pt verbalized understanding.

## 2014-05-17 NOTE — Consult Note (Signed)
Reason for consult: port a cath placement  Referring Physician(s): Kefalas,Thomas S  History of Present Illness: [name] is a 49 y.o. female with history of recently diagnosed metastatic adenocarcinoma(possible breast origin) who presents today from [facility] for port a cath placement for chemotherapy.   Past Medical History  Diagnosis Date  . PONV (postoperative nausea and vomiting)     pt states scope patch does well  . Anemia   . Depression   . Anxiety   . Headache(784.0)     has migraines - medication controls  . Breast cancer 04/2014    Past Surgical History  Procedure Laterality Date  . Right rotator cuff      2002  . Neck fusion      2003  . Laparoscopic cholecystectomy      2004  . Right knee arthroscopy      2005  . Colon surgery      2008  . Appendectomy      2008  . Abdominal hysterectomy    . Breast reduction surgery  03/17/2011    Procedure: MAMMARY REDUCTION BILATERAL (BREAST);  Surgeon: Mary A Contogiannis;  Location: [place];  Service: Plastics;  Laterality: Bilateral;  . Colonoscopy N/A 07/13/2013    Procedure: COLONOSCOPY;  Surgeon: Rogene Houston, MD;  [facility];  Service: Endoscopy;  Laterality: N/A;  930  . Liver biopsy  04/2014    Allergies: Review of patient's allergies indicates no known allergies.  Medications: Prior to Admission medications   Medication Sig Start Date End Date Taking? Authorizing Provider  ALPRAZolam Duanne Moron) 0.5 MG tablet Take 1 tablet (0.5 mg total) by mouth 3 (three) times daily as needed for anxiety or sleep. 04/27/14   Radene Gunning, NP  Docusate Calcium (STOOL SOFTENER PO) Take 1 capsule by mouth daily.    Historical Provider, MD  fentaNYL (DURAGESIC) 12 MCG/HR Place 1 patch (12.5 mcg total) onto the skin every 3 (three) days. 05/16/14   Molli Hazard, MD  HYDROcodone-acetaminophen ([place]) 7.5-325 MG per tablet Take 1 tablet by mouth every 8 (eight) hours as needed for  moderate pain.    Historical Provider, MD  ondansetron (ZOFRAN) 8 MG tablet Take 1 tablet (8 mg total) by mouth every 8 (eight) hours as needed for nausea or vomiting. 04/27/14   Radene Gunning, NP  oxyCODONE (OXY IR/ROXICODONE) 5 MG immediate release tablet Take 1 tablet (5 mg total) by mouth every 4 (four) hours as needed for severe pain. 05/16/14   Molli Hazard, MD  pantoprazole (PROTONIX) 40 MG tablet Take 1 tablet (40 mg total) by mouth daily. 05/16/14   Molli Hazard, MD  sertraline (ZOLOFT) 100 MG tablet Take 150 mg by mouth every morning.     Historical Provider, MD  SUMAtriptan (IMITREX) 100 MG tablet Take 100 mg by mouth as needed. foir migraine 03/26/14   Historical Provider, MD  tamoxifen (NOLVADEX) 20 MG tablet Take 1 tablet (20 mg total) by mouth daily. 05/09/14   Molli Hazard, MD    Family History  Problem Relation Age of Onset  . Colon cancer Neg Hx   . Diabetes Father   . Heart attack Maternal Grandmother 30    multiple over lifetime.    History   Social History  . Marital Status: Married    Spouse Name: N/A    Number of Children: N/A  . Years of Education: N/A   Social History Main Topics  .  Smoking status: Former Smoker    Types: Cigarettes  . Smokeless tobacco: Never Used  . Alcohol Use: Yes     Comment: Occasionally  . Drug Use: No  . Sexual Activity: Yes    Birth Control/ Protection: Surgical   Other Topics Concern  . Not on file   Social History Narrative        Review of Systems  Constitutional: Negative for fever and chills.  Respiratory:       Occ cough/dyspnea  Cardiovascular:       Occ chest discomfort referred from metastatic hepatic disease  Gastrointestinal: Positive for nausea and abdominal pain. Negative for vomiting and blood in stool.  Genitourinary: Negative for dysuria and hematuria.  Musculoskeletal: Negative for back pain.  Neurological: Positive for headaches.  Psychiatric/Behavioral: The patient  is nervous/anxious.     Vital Signs: There were no vitals taken for this visit.  Physical Exam  Constitutional: She is oriented to person, place, and time. She appears well-developed and well-nourished.  Cardiovascular: Normal rate and regular rhythm.   Pulmonary/Chest: Effort normal and breath sounds normal.  Abdominal: Soft. Bowel sounds are normal. There is tenderness.  Musculoskeletal: Normal range of motion. She exhibits no edema.  Neurological: She is alert and oriented to person, place, and time.    Imaging: Ct Abdomen Pelvis W Wo Contrast  04/25/2014   CLINICAL DATA:  Left-sided chest pain with elevated liver function studies and multiple liver lesions on ultrasound. No given history of malignancy. Initial encounter.  EXAM: CT ABDOMEN AND PELVIS WITHOUT AND WITH CONTRAST  TECHNIQUE: Multidetector CT imaging of the abdomen and pelvis was performed following the standard protocol before and following the bolus administration of intravenous contrast.  CONTRAST:  64mL OMNIPAQUE IOHEXOL 300 MG/ML SOLN, 120mL OMNIPAQUE IOHEXOL 300 MG/ML SOLN  COMPARISON:  Ultrasound 04/24/2014.  Abdominal pelvic CT 03/30/2007.  FINDINGS: Lower chest: Clear lung bases. No significant pleural or pericardial effusion.  Hepatobiliary: As demonstrated on ultrasound, there are innumerable new liver lesions consistent with metastatic disease. Representative lesions within the medial segment of the left lobe include a 3.4 cm lesion on image 48 and a 2.8 cm lesion on image 55 of series 7. The low-density lesion posteriorly in the right hepatic lobe is unchanged from the prior CT and consistent with a hemangioma. This measures 3.7 cm on image 37 of series 7. There is no significant biliary dilatation status post cholecystectomy.  Pancreas: Unremarkable. No pancreatic ductal dilatation or surrounding inflammatory changes.  Spleen: The spleen remains normal in size and demonstrates no suspicious lesions. There is a knee 12  mm low-density lesion anteriorly which has slightly enlarged from the prior study, although is likely an incidental cyst. Small splenules are noted.  Adrenals/Urinary Tract: Both adrenal glands appear normal.The kidneys appear normal without evidence of urinary tract calculus or hydronephrosis. No bladder abnormalities are seen.  Stomach/Bowel: No evidence of bowel wall thickening, distention or surrounding inflammatory change.Retrocecal surgical clips suggest prior cholecystectomy. There is no extraluminal fluid collection.  Vascular/Lymphatic: There are no enlarged abdominal or pelvic lymph nodes. No significant vascular findings are present.  Reproductive: Status post partial hysterectomy. Both ovaries are visualized and appear normal.  Other: No evidence of abdominal wall mass or hernia.  Musculoskeletal: There are multiple new lytic lesions consistent with metastatic disease. The largest is in the left sacrum measuring 2.0 cm on image 123. There are multiple small lesions within the spine, largest at L4. There are small lesions within the proximal femurs.  No epidural tumor or pathologic fracture demonstrated.  IMPRESSION: 1. Widespread metastatic disease to the liver. 2. Multiple lytic lesions are noted throughout the spine and pelvis consistent with metastatic disease. No pathologic fracture or epidural tumor demonstrated. 3. No primary malignancy identified within the abdomen or pelvis. Consider breast primary. The liver lesions should be amenable to percutaneous biopsy.   Electronically Signed   By: Camie Patience M.D.   On: 04/25/2014 08:29   Dg Chest 2 View  04/23/2014   CLINICAL DATA:  Left chest pain, shortness of breath  EXAM: CHEST  2 VIEW  COMPARISON:  08/14/2009  FINDINGS: Lungs are clear.  No pleural effusion or pneumothorax.  The heart is normal in size.  Visualized osseous structures are within normal limits.  IMPRESSION: Normal chest radiographs.   Electronically Signed   By: Julian Hy  M.D.   On: 04/23/2014 14:39   Dg Thoracolumabar Spine  05/09/2014   CLINICAL DATA:  Mid back pain for 1 week. Bent over and felt pop. Breast cancer with recent diagnosis of bone metastases.  EXAM: THORACOLUMBAR SPINE - 2 VIEW  COMPARISON:  04/26/2014 chest CT.  FINDINGS: No focal bone lesions visible. The previously seen lytic lesions by CT cannot be appreciated by plain film. Normal alignment. Disc spaces are maintained. No fracture.  IMPRESSION: No fracture. Previously seen lytic lesions by CT cannot be appreciated on plain films.   Electronically Signed   By: Rolm Baptise M.D.   On: 05/09/2014 12:44   Ct Head W Wo Contrast  04/26/2014   CLINICAL DATA:  Malignancy. Evidence of widespread metastatic disease involving the liver and bones on recent imaging without a known primary malignancy.  EXAM: CT HEAD WITHOUT AND WITH CONTRAST  TECHNIQUE: Contiguous axial images were obtained from the base of the skull through the vertex without and with intravenous contrast  CONTRAST:  61mL OMNIPAQUE IOHEXOL 300 MG/ML  SOLN  COMPARISON:  Brain MRI 09/15/2008  FINDINGS: There is no evidence of acute cortical infarct, intracranial hemorrhage, mass, midline shift, or extra-axial fluid collection. Ventricles and sulci are normal. Gray-white differentiation is preserved. No abnormal enhancement is identified.  Prior bilateral ocular lens extractions are noted. No suspicious skull lesions are identified. The visualized paranasal sinuses and mastoid air cells are clear.  IMPRESSION: Unremarkable head CT.  No evidence of intracranial metastases.   Electronically Signed   By: Logan Bores   On: 04/26/2014 11:36   Ct Chest W Contrast  04/26/2014   CLINICAL DATA:  Malignancy, chest pain starting 04/23/2014  EXAM: CT CHEST WITH CONTRAST  TECHNIQUE: Multidetector CT imaging of the chest was performed during intravenous contrast administration.  CONTRAST:  43mL OMNIPAQUE IOHEXOL 300 MG/ML  SOLN  COMPARISON:  CT scan abdomen and  pelvis 04/25/2014  FINDINGS: Sagittal images of the thoracic spine shows lytic lesion in T8 vertebral body measures 6.5 mm. Lytic lesion in T11 vertebral body measures 7.6 mm. Findings are consistent with metastatic disease. No pathologic fractures are noted. There is lytic lesion right second rib measures 1.4 cm.  Images of the thoracic inlet are unremarkable. Central airways are patent. A precarinal lymph node measures 7 mm short-axis not pathologic. No hilar adenopathy.  No axillary adenopathy.  Images of the lung parenchyma shows no acute infiltrate or pulmonary edema. No pulmonary nodules are noted. No pulmonary mass is identified. Again noted innumerable target lesions within liver consistent with diffuse metastatic disease. Heart size within normal limits. No pericardial effusion.  IMPRESSION: 1. No  lung mass or pulmonary nodules. 2. No mediastinal or hilar adenopathy. 3. Again noted diffuse metastatic hepatic disease. 4. Lytic bone lesions are noted consistent with metastatic disease. No destructive bony lesions or pathologic fractures.   Electronically Signed   By: Lahoma Crocker M.D.   On: 04/26/2014 12:04   Ct Angio Chest Pe W/cm &/or Wo Cm  05/16/2014   CLINICAL DATA:  Acute onset of shortness of breath and pleuritic chest pain. Recent 6 hour car ride. History of right breast cancer with liver and bone metastases. Initial encounter.  EXAM: CT ANGIOGRAPHY CHEST WITH CONTRAST  TECHNIQUE: Multidetector CT imaging of the chest was performed using the standard protocol during bolus administration of intravenous contrast. Multiplanar CT image reconstructions and MIPs were obtained to evaluate the vascular anatomy.  CONTRAST:  12mL OMNIPAQUE IOHEXOL 350 MG/ML SOLN  COMPARISON:  CT of the chest performed 04/26/2014, and PET/CT performed earlier today at 1:01 p.m.  FINDINGS: There is no evidence of pulmonary embolus.  Minimal right basilar atelectasis is noted. The lungs are otherwise clear. There is no evidence  of significant focal consolidation, pleural effusion or pneumothorax. No masses are identified; no abnormal focal contrast enhancement is seen.  The mediastinum is unremarkable in appearance. No mediastinal lymphadenopathy is seen. No pericardial effusion is identified. The great vessels are grossly unremarkable. Apparent low-attenuation prominence of the inferior cavoatrial junction seems to be artifactual in nature, on correlation with recent PET/CT. No axillary lymphadenopathy is seen. The visualized portions of the thyroid gland are unremarkable in appearance.  Extensive diffuse metastatic disease is noted filling the liver. The visualized portions of the spleen are unremarkable.  Scattered small lytic lesions are noted throughout the visualized osseous structures, reflecting metastatic disease.  Review of the MIP images confirms the above findings.  IMPRESSION: 1. No evidence of pulmonary embolus. 2. Minimal right basilar atelectasis noted; lungs otherwise clear. 3. Extensive diffuse metastatic disease again noted filling the liver. 4. Numerous scattered small bony metastases again noted.   Electronically Signed   By: Garald Balding M.D.   On: 05/16/2014 19:37   US Abdomen Complete  04/24/2014   CLINICAL DATA:  Patient with diffuse abdominal pain.  EXAM: ULTRASOUND ABDOMEN COMPLETE  COMPARISON:  CT 03/30/2007  FINDINGS: Gallbladder: Surgically absent  Common bile duct: Diameter: 7 mm  Liver: Multiple hypoechoic lesions are demonstrated throughout the hepatic parenchyma. Reference lesion within the right hepatic lobe measures up to 3 cm. Additionally within the posterior right hepatic lobe there is a 3 x 3 cm homogeneously hyperechoic lesion, compatible with hemangioma, visualized on prior CT 03/30/2007.  IVC: No abnormality visualized.  Pancreas: Visualized portion unremarkable.  Spleen: Normal size. Nonspecific 1 cm hyperechoic lesion along the peripheral margin of the spleen, potentially correlating with  previously visualized cystic lesions on prior CT.  Right Kidney: Length: 11.3 cm. Echogenicity within normal limits. No mass or hydronephrosis visualized.  Left Kidney: Length: 11.8 cm. Echogenicity within normal limits. No mass or hydronephrosis visualized.  Abdominal aorta: No aneurysm visualized.  Other findings: None.  IMPRESSION: Multiple hypoechoic lesions throughout the hepatic parenchyma, concerning for hepatic metastatic disease. Recommend correlation with CT.  Nonspecific focal hyperechoic lesion within the periphery of the spleen, potentially correlating with previously identified splenic cystic lesions.  These results will be called to the ordering clinician or representative by the Radiologist Assistant, and communication documented in the PACS or zVision Dashboard.   Electronically Signed   By: Lovey Newcomer M.D.   On: 04/24/2014 15:37  Nm Pulmonary Perf And Vent  04/24/2014   CLINICAL DATA:  Chest pain  EXAM: NUCLEAR MEDICINE VENTILATION - PERFUSION LUNG SCAN  Views: Anterior, posterior, left lateral, right lateral, RPO, LPO, RAO, LAO -ventilation and perfusion  Radionuclide: Technetium 93m DTPA -ventilation; Technetium 59m macroaggregated albumin-perfusion  Dose:  44.0 mCi-ventilation; 6.5 mCi-perfusion  Route of administration: Inhalation-ventilation; intravenous -perfusion  COMPARISON:  Chest radiograph April 23, 2014  FINDINGS: Ventilation: Radiotracer uptake is homogeneous and symmetric bilaterally. No ventilation defects are identified.  Perfusion: Radiotracer uptake is homogeneous and symmetric bilaterally. No perfusion defects are identified.  IMPRESSION: No ventilation or perfusion defects. Very low probability of pulmonary embolus.   Electronically Signed   By: Lowella Grip M.D.   On: 04/24/2014 19:44   Nm Pet Image Initial (pi) Skull Base To Thigh  05/16/2014   CLINICAL DATA:  Initial treatment strategy for breast carcinoma. Stage IV breast carcinoma.  EXAM: NUCLEAR MEDICINE  PET SKULL BASE TO THIGH  TECHNIQUE: 12.4 mCi F-18 FDG was injected intravenously. Full-ring PET imaging was performed from the skull base to thigh after the radiotracer. CT data was obtained and used for attenuation correction and anatomic localization.  FASTING BLOOD GLUCOSE:  Value: 84 mg/dl  COMPARISON:  CT 04/26/2014  FINDINGS: NECK  No hypermetabolic lymph nodes in the neck.  CHEST  No hypermetabolic mediastinal or hilar nodes. No suspicious pulmonary nodules on the CT scan.  ABDOMEN/PELVIS  There is intense hypermetabolic activity diffusely throughout the liver associated the multiple hypodense lesion. This activity is very high with SUV max 21.8.  There no hypermetabolic periportal lymph nodes. No hypermetabolic retroperitoneal or pelvic lymph nodes. No abnormal metabolicassociated with the adrenal glands or spleen.  SKELETON  There are multiple foci of intense metabolic activity associated with lytic lesions throughout the bones of the pelvis, spine and ribs. Example lytic lesion in the left sacral ala measures 2.8 cm (image 167 series 4) with SUV max 17.6. Similar lytic lesion in the L5 vertebral body with SUV max equals 17.4. Multiple smaller lesions are scattered throughout the spine, ribs, and shoulders.  IMPRESSION: 1. Intensely hypermetabolic hepatic metastasis. 2. Widespread hypermetabolic skeletal lesions. 3. No primary adenocarcinoma identified by FDG PET imaging.   Electronically Signed   By: Suzy Bouchard M.D.   On: 05/16/2014 14:14   US Biopsy  04/27/2014   CLINICAL DATA:  49 year old with liver and bone lesions. Tissue diagnosis is needed.  EXAM: ULTRASOUND-GUIDED LIVER LESION BIOPSY  Physician: Stephan Minister. Anselm Pancoast, MD  FLUOROSCOPY TIME:  None  MEDICATIONS: 2 mg Versed, 125 mcg fentanyl. A radiology nurse monitored the patient for moderate sedation.  ANESTHESIA/SEDATION: Moderate sedation time: 16 min  PROCEDURE: The procedure was explained to the patient. The risks and benefits of the procedure  were discussed and the patient's questions were addressed. Informed consent was obtained from the patient. The liver was evaluated with ultrasound. A lesion in the left hepatic lobe was targeted. The anterior abdomen was prepped and draped in a sterile fashion. Skin and soft tissues were anesthetized with 1% lidocaine. A 17 gauge needle was directed into the anterior left hepatic lobe with ultrasound guidance. Needle was advanced into a small lesion. A total of 3 core biopsies were obtained with an 18 gauge core device. 17 gauge needle was removed without complication. Core samples were placed in formalin. A bandage was placed at the puncture site.  FINDINGS: Numerous lesions scattered throughout the liver. A lesion in the anterior and inferior left hepatic lobe was  targeted and sampled.  COMPLICATIONS: None  IMPRESSION: Ultrasound-guided core biopsies of a left hepatic lesion.   Electronically Signed   By: Markus Daft M.D.   On: 04/27/2014 17:26   Dg Abd 2 Views  05/16/2014   CLINICAL DATA:  Acute abdominal pain. Initial encounter. History of metastatic breast cancer.  EXAM: ABDOMEN - 2 VIEW  COMPARISON:  05/16/2014 PET-CT scan  FINDINGS: The bowel gas pattern is normal.  There is no evidence of free air.  No suspicious calcifications are identified.  Lytic metastases within the proximal right femur and right ischium are noted as demonstrated by today's PET-CT.  IMPRESSION: Unremarkable bowel gas pattern.  Right bony metastases identified as seen on today's PET-CT. Other bony metastases are difficult to visualize on this study.   Electronically Signed   By: Hassan Rowan M.D.   On: 05/16/2014 19:27   Mm Digital Diagnostic Bilat  05/15/2014   CLINICAL DATA:  Patient has metastatic disease to the liver of unknown primary, but with pathology suggesting a possible breast primary. No current breast complaints.  EXAM: DIGITAL DIAGNOSTIC  BILATERAL MAMMOGRAM WITH CAD  COMPARISON:  Prior exams  ACR Breast Density Category a:  The breast tissue is almost entirely fatty.  FINDINGS: There are no discrete masses or areas of architectural distortion. Few small calcifications in the lateral right breast have decreased when compared to prior studies. There was a larger cluster of calcifications seen in 2014 in this location, mostly all resolved. There are no new or suspicious calcifications.  Mammographic images were processed with CAD.  IMPRESSION: No evidence of malignancy.  Benign right breast calcifications.  RECOMMENDATION: Screening mammogram in one year.(Code:[id number])  I have discussed the findings and recommendations with the patient. Results were also provided in writing at the conclusion of the visit. If applicable, a reminder letter will be sent to the patient regarding the next appointment.  BI-RADS CATEGORY  2: Benign Finding(s)   Electronically Signed   By: Lajean Manes M.D.   On: 05/15/2014 11:39    Labs:  CBC:  Recent Labs  04/25/14 0618 05/09/14 1011 05/16/14 1946 05/17/14 0557  WBC 8.3 10.1 11.7* 11.7*  HGB 12.9 13.2 11.8* 12.7  HCT 40.9 40.8 36.6 39.3  PLT 227 285 242 255    COAGS:  Recent Labs  04/26/14 0545  INR 1.00  APTT 33    BMP:  Recent Labs  04/25/14 0618 05/09/14 1011 05/16/14 1946 05/17/14 0557  NA 142 135 135 136  K 4.2 4.2 4.1 4.3  CL 102 99 103 103  CO2 27 27 26 23   GLUCOSE 93 91 123* 180*  BUN 10 15 14 13   CALCIUM 9.3 9.7 9.1 9.9  CREATININE 0.86 0.93 0.76 0.79  GFRNONAA 77* 70* >90 >90  GFRAA 90* 82* >90 >90    LIVER FUNCTION TESTS:  Recent Labs  04/25/14 0618 05/09/14 1011 05/16/14 1946 05/17/14 0557  BILITOT 0.4 0.8 0.7 1.0  AST 105* 113* 126* 133*  ALT 112* 76* 80* 86*  ALKPHOS 236* 285* 297* 321*  PROT 7.1 7.6 6.8 7.4  ALBUMIN 3.5 3.8 3.3* 3.6    TUMOR MARKERS:  Recent Labs  04/25/14 0622 04/26/14 0939  AFPTM 3.1  --   CHROMGRNA  --  10    Assessment and Plan: [name] is a 49 y.o. female with history of recently diagnosed  metastatic adenocarcinoma(possible breast origin) who presents today from [facility] for port a cath placement for chemotherapy. Imaging studies  have been reviewed by Dr. Earleen Newport. Details/risks of procedure d/w pt/husband with their understanding and consent.    I spent a total of 20 minutes face to face in clinical consultation, greater than 50% of which was counseling/coordinating care for port a cath placement  Signed: ALLRED,D [facility] 05/17/2014, 2:16 PM

## 2014-05-18 ENCOUNTER — Inpatient Hospital Stay (HOSPITAL_COMMUNITY): Payer: 59 | Admitting: Hematology & Oncology

## 2014-05-18 ENCOUNTER — Encounter (HOSPITAL_COMMUNITY): Payer: Self-pay

## 2014-05-18 ENCOUNTER — Inpatient Hospital Stay (HOSPITAL_COMMUNITY): Payer: 59

## 2014-05-18 ENCOUNTER — Inpatient Hospital Stay (HOSPITAL_COMMUNITY)
Admission: AD | Admit: 2014-05-18 | Discharge: 2014-05-18 | Disposition: A | Discharge status: Home / Self Care | Payer: 59 | Source: Ambulatory Visit | Attending: Oncology | Admitting: Oncology

## 2014-05-18 DIAGNOSIS — C50919 Malignant neoplasm of unspecified site of unspecified female breast: Secondary | ICD-10-CM

## 2014-05-18 DIAGNOSIS — C229 Malignant neoplasm of liver, not specified as primary or secondary: Secondary | ICD-10-CM

## 2014-05-18 HISTORY — PX: TRANSTHORACIC ECHOCARDIOGRAM: SHX275

## 2014-05-18 MED ORDER — HEPARIN SOD (PORK) LOCK FLUSH 100 UNIT/ML IV SOLN
500.0000 [IU] | INTRAVENOUS | Status: AC | PRN
  Administered 2014-05-18: 500 [IU]
  Filled 2014-05-18: qty 5

## 2014-05-18 MED ORDER — DEXAMETHASONE 4 MG PO TABS: 4.0000 mg | ORAL_TABLET | Freq: Two times a day (BID) | ORAL | Status: DC

## 2014-05-18 MED ORDER — TECHNETIUM TC 99M-LABELED RED BLOOD CELLS IV KIT
25.0000 | PACK | Freq: Once | INTRAVENOUS | Status: AC | PRN
  Administered 2014-05-18: 25 via INTRAVENOUS

## 2014-05-18 MED ORDER — HEPARIN SOD (PORK) LOCK FLUSH 100 UNIT/ML IV SOLN
INTRAVENOUS | Status: AC
  Administered 2014-05-18: 50 [IU] via INTRAVENOUS
  Filled 2014-05-18: qty 5

## 2014-05-18 MED ORDER — MORPHINE SULFATE ER 15 MG PO TBCR: 15.0000 mg | EXTENDED_RELEASE_TABLET | Freq: Two times a day (BID) | ORAL | Status: DC

## 2014-05-18 MED ORDER — MORPHINE SULFATE ER 15 MG PO TBCR
15.0000 mg | EXTENDED_RELEASE_TABLET | Freq: Two times a day (BID) | ORAL | Status: DC
  Administered 2014-05-18: 15 mg via ORAL
  Filled 2014-05-18: qty 1

## 2014-05-18 MED ORDER — ENOXAPARIN SODIUM 40 MG/0.4ML ~~LOC~~ SOLN: 40.0000 mg | SUBCUTANEOUS | Status: DC

## 2014-05-18 NOTE — Progress Notes (Signed)
  PROGRESS NOTE  [name] AQT:[id number] DOB: 1966/02/25 DOA: 05/16/2014 PCP: [name], MD  Summary: 49 year old woman diagnosed with metastatic breast cancer by liver biopsy 04/2014 who was seen in the office today at the cancer center in excruciating pain. Direct admission was requested to the hospital service for intractable pain.  Assessment/Plan: 1. Intractable right upper quadrant, rib cage, bony pain secondary to metastatic disease. Currently well controlled on oral morphine and breakthrough narcotics. 2. Severe epigastric pain with pleuritic component secondary to metastatic disease. CT intravenous the chest negative for PE. Abdominal radiograph unremarkable, lipase and hepatic function panel unremarkable. Tolerating diet. 3. Stage IV breast cancer with multiple lytic bone lesions throughout the spine, pelvis with diffuse metastatic hepatic disease. 4. Status post Port-A-Cath placement   Much improved. Discharge home on oral narcotics as per oncology.  Murray Hodgkins, MD  [facility]  Pager [phone] If 7PM-7AM, please contact night-coverage at [email or web address], password [facility] 05/18/2014, 3:09 PM  LOS: 2 days   Consultants:  Oncology   Procedures:  Port-A-Cath placement  Antibiotics:    HPI/Subjective: Feels much better. Tolerating diet. Pain controlled. Wants to go home.  Objective: Filed Vitals:   05/18/14 0609 05/18/14 0800 05/18/14 1200 05/18/14 1357  BP: 118/65   119/67  Pulse: 66   67  Temp: 97.8 F (36.6 C)     TempSrc: Oral     Resp: 20 18 20 18   Height:      Weight:      SpO2: 96% 97%  96%    Intake/Output Summary (Last 24 hours) at 05/18/14 1509 Last data filed at 05/18/14 0900  Gross per 24 hour  Intake    240 ml  Output      0 ml  Net    240 ml     Filed Weights   05/16/14 1739  Weight: 112.492 kg (248 lb)    Exam:     Afebrile, VSS  Appears calm, comfortable. Speech fluent and clear. Respiratory clear to  auscultation bilaterally. No wheezes, rales or rhonchi. Normal respiratory effort. Cardiovascular regular rate and rhythm. No murmur, rub or gallop. Abdomen soft.  Data Reviewed:  CMP unremarkable except for elevation of AST, ALT secondary to known hepatic disease.  CBC essentially unremarkable, minimal leukocytosis.  Pertinent data: Admission Labs  CMP, lipase unremarkable Imaging   CT angiogram of the chest, no PE. Extensive diffuse metastatic disease noted.  Abdominal series, unremarkable bowel gas pattern.  Scheduled Meds: . dexamethasone  8 mg Oral Q12H  . enoxaparin (LOVENOX) injection  40 mg Subcutaneous Q24H  . feeding supplement (RESOURCE BREEZE)  1 Container Oral TID BM  . morphine  15 mg Oral Q12H  . morphine   Intravenous 6 times per day  . pantoprazole  40 mg Oral Daily  . senna-docusate  1 tablet Oral BID  . sertraline  150 mg Oral q morning - 10a  . sodium chloride  3 mL Intravenous Q12H  . sodium chloride  3 mL Intravenous Q12H  . tamoxifen  20 mg Oral Daily   Continuous Infusions:   Principal Problem:   Intractable abdominal pain Active Problems:   Adenocarcinoma determined by biopsy of liver   Breast cancer, stage 4

## 2014-05-18 NOTE — Progress Notes (Signed)
  Echocardiogram 2D Echocardiogram has been performed.  [place], [place] 05/18/2014, 11:43 AM

## 2014-05-18 NOTE — Progress Notes (Unsigned)
Teaching done for Herceptin and Perjeta and consent signed. Distress screening done. Calendar given to patient. Pt is currently an inpatient. Taxotere added to patient's chemo plan. Consent will be obtained for this. However, Taxotere may not be given to patient on Tuesday. Dr. Whitney Muse will make the final decision on Tuesday whether or not this drug is given. Patient also taught about Depo Lupron and consent obtained for that.

## 2014-05-18 NOTE — Care Management Note (Signed)
    Page 1 of 1   05/18/2014     2:30:54 PM CARE MANAGEMENT NOTE 05/18/2014  Patient:  [name], [name]   Account Number:  [id number]  Date Initiated:  05/18/2014  Documentation initiated by:  Theophilus Kinds  Subjective/Objective Assessment:   Pt admitted from home with uncontrolled pain due to cancer. Pt lives with her husband and will return home at discharge. Pt is independent with ADl's.     Action/Plan:   No Cm needs noted. Anticipate discharge within 24 hours.   Anticipated DC Date:  05/19/2014   Anticipated DC Plan:  [place]  CM consult      Choice offered to / List presented to:             Status of service:  Completed, signed off Medicare Important Message given?   (If response is "NO", the following Medicare IM given date fields will be blank) Date Medicare IM given:   Medicare IM given by:   Date Additional Medicare IM given:   Additional Medicare IM given by:    Discharge Disposition:  HOME/SELF CARE  Per UR Regulation:    If discussed at Long Length of Stay Meetings, dates discussed:    Comments:  05/18/14 [place], RN BSN CM

## 2014-05-18 NOTE — Discharge Summary (Signed)
Physician Discharge Summary  [place] JKD:[id number] DOB: 1966-05-08 DOA: 05/16/2014  PCP: [name], MD  Admit date: 05/16/2014 Discharge date: 05/18/2014  Recommendations for Outpatient Follow-up:  1. Ongoing treatment for malignancy and malignancy-induced pain   Follow-up Information    Follow up with [name], MD.   Why:  in 3 months   Contact information:   [place] [id number] [phone]      Discharge Diagnoses:  1. Intractable right upper quadrant, rib cage, bony pain secondary to metastatic disease 2. Epigastric pain secondary to metastatic disease 3. Stage IV cancer of unclear etiology with multiple lytic bone lesions throughout the spine, pelvis, liver  Discharge Condition: improved Disposition: home  Diet recommendation: regular  Filed Weights   05/16/14 1739  Weight: 112.492 kg (248 lb)    History of present illness:  49 year old woman diagnosed with metastatic breast cancer by liver biopsy 04/2014 who was seen in the office today at the cancer center in excruciating pain. Direct admission was requested to the hospital service for intractable pain.  Hospital Course:  Mrs. Carre was treated with morphine PCA with significant improvement, transition successfully to oral morphine. Workup for other sources of pain was negative. Hospitalization was uncomplicated.  1. Intractable right upper quadrant, rib cage, bony pain secondary to metastatic disease. Currently well controlled on oral morphine and breakthrough narcotics. 2. Severe epigastric pain with pleuritic component secondary to metastatic disease. CT intravenous the chest negative for PE. Abdominal radiograph unremarkable, lipase and hepatic function panel unremarkable. Tolerating diet. 3. Stage IV breast cancer with multiple lytic bone lesions throughout the spine, pelvis with diffuse metastatic hepatic disease. 4. Status post Port-A-Cath  placement  Consultants:  Oncology  Procedures:  Port-A-Cath placement  Discharge Instructions  Discharge Instructions    Activity as tolerated - No restrictions    Complete by:  As directed      Diet general    Complete by:  As directed      Discharge instructions    Complete by:  As directed   Call your physician or seek immediate medical attention for increased pain, constipation past 2-3 days, shortness of breath or worsening of condition.          Current Discharge Medication List    START taking these medications   Details  morphine (MS CONTIN) 15 MG 12 hr tablet Take 1 tablet (15 mg total) by mouth every 12 (twelve) hours.      CONTINUE these medications which have NOT CHANGED   Details  ALPRAZolam (XANAX) 0.5 MG tablet Take 1 tablet (0.5 mg total) by mouth 3 (three) times daily as needed for anxiety or sleep. Qty: 10 tablet, Refills: 0    Docusate Calcium (STOOL SOFTENER PO) Take 1 capsule by mouth daily.    HYDROcodone-acetaminophen (NORCO) 7.5-325 MG per tablet Take 1 tablet by mouth every 8 (eight) hours as needed for moderate pain.    pantoprazole (PROTONIX) 40 MG tablet Take 1 tablet (40 mg total) by mouth daily. Qty: 30 tablet, Refills: 3   Associated Diagnoses: Adenocarcinoma determined by biopsy of liver    sertraline (ZOLOFT) 100 MG tablet Take 150 mg by mouth every morning.     SUMAtriptan (IMITREX) 100 MG tablet Take 100 mg by mouth as needed. foir migraine    tamoxifen (NOLVADEX) 20 MG tablet Take 1 tablet (20 mg total) by mouth daily. Qty: 30 tablet, Refills: 2    dexamethasone (DECADRON) 4 MG tablet Take 1 tablet (  4 mg total) by mouth 2 (two) times daily with a meal. Qty: 60 tablet, Refills: 0   Associated Diagnoses: Breast cancer, stage 4, unspecified laterality    ondansetron (ZOFRAN) 8 MG tablet Take 1 tablet (8 mg total) by mouth every 8 (eight) hours as needed for nausea or vomiting. Qty: 30 tablet, Refills: 1   Associated Diagnoses:  Breast cancer, stage 4, unspecified laterality      STOP taking these medications     oxyCODONE (OXY IR/ROXICODONE) 5 MG immediate release tablet      fentaNYL (DURAGESIC) 12 MCG/HR      lidocaine-prilocaine (EMLA) cream        No Known Allergies  The results of significant diagnostics from this hospitalization (including imaging, microbiology, ancillary and laboratory) are listed below for reference.    Significant Diagnostic Studies: Ct Angio Chest Pe W/cm &/or Wo Cm  05/16/2014   CLINICAL DATA:  Acute onset of shortness of breath and pleuritic chest pain. Recent 6 hour car ride. History of right breast cancer with liver and bone metastases. Initial encounter.  EXAM: CT ANGIOGRAPHY CHEST WITH CONTRAST  TECHNIQUE: Multidetector CT imaging of the chest was performed using the standard protocol during bolus administration of intravenous contrast. Multiplanar CT image reconstructions and MIPs were obtained to evaluate the vascular anatomy.  CONTRAST:  156mL OMNIPAQUE IOHEXOL 350 MG/ML SOLN  COMPARISON:  CT of the chest performed 04/26/2014, and PET/CT performed earlier today at 1:01 p.m.  FINDINGS: There is no evidence of pulmonary embolus.  Minimal right basilar atelectasis is noted. The lungs are otherwise clear. There is no evidence of significant focal consolidation, pleural effusion or pneumothorax. No masses are identified; no abnormal focal contrast enhancement is seen.  The mediastinum is unremarkable in appearance. No mediastinal lymphadenopathy is seen. No pericardial effusion is identified. The great vessels are grossly unremarkable. Apparent low-attenuation prominence of the inferior cavoatrial junction seems to be artifactual in nature, on correlation with recent PET/CT. No axillary lymphadenopathy is seen. The visualized portions of the thyroid gland are unremarkable in appearance.  Extensive diffuse metastatic disease is noted filling the liver. The visualized portions of the spleen  are unremarkable.  Scattered small lytic lesions are noted throughout the visualized osseous structures, reflecting metastatic disease.  Review of the MIP images confirms the above findings.  IMPRESSION: 1. No evidence of pulmonary embolus. 2. Minimal right basilar atelectasis noted; lungs otherwise clear. 3. Extensive diffuse metastatic disease again noted filling the liver. 4. Numerous scattered small bony metastases again noted.   Electronically Signed   By: Garald Balding M.D.   On: 05/16/2014 19:37   Nm Pet Image Initial (pi) Skull Base To Thigh  05/16/2014   CLINICAL DATA:  Initial treatment strategy for breast carcinoma. Stage IV breast carcinoma.  EXAM: NUCLEAR MEDICINE PET SKULL BASE TO THIGH  TECHNIQUE: 12.4 mCi F-18 FDG was injected intravenously. Full-ring PET imaging was performed from the skull base to thigh after the radiotracer. CT data was obtained and used for attenuation correction and anatomic localization.  FASTING BLOOD GLUCOSE:  Value: 84 mg/dl  COMPARISON:  CT 04/26/2014  FINDINGS: NECK  No hypermetabolic lymph nodes in the neck.  CHEST  No hypermetabolic mediastinal or hilar nodes. No suspicious pulmonary nodules on the CT scan.  ABDOMEN/PELVIS  There is intense hypermetabolic activity diffusely throughout the liver associated the multiple hypodense lesion. This activity is very high with SUV max 21.8.  There no hypermetabolic periportal lymph nodes. No hypermetabolic retroperitoneal or pelvic lymph  nodes. No abnormal metabolicassociated with the adrenal glands or spleen.  SKELETON  There are multiple foci of intense metabolic activity associated with lytic lesions throughout the bones of the pelvis, spine and ribs. Example lytic lesion in the left sacral ala measures 2.8 cm (image 167 series 4) with SUV max 17.6. Similar lytic lesion in the L5 vertebral body with SUV max equals 17.4. Multiple smaller lesions are scattered throughout the spine, ribs, and shoulders.  IMPRESSION: 1.  Intensely hypermetabolic hepatic metastasis. 2. Widespread hypermetabolic skeletal lesions. 3. No primary adenocarcinoma identified by FDG PET imaging.   Electronically Signed   By: Suzy Bouchard M.D.   On: 05/16/2014 14:14   Ir Fluoro Guide Cv Line Right  05/17/2014   CLINICAL DATA:  49 year old female with a history of breast carcinoma. She has been referred for port catheter placement.  EXAM: IR RIGHT FLOURO GUIDE CV LINE; IR ULTRASOUND GUIDANCE VASC ACCESS RIGHT  Date: 05/17/2014  ANESTHESIA/SEDATION: Moderate (conscious) sedation was administered during this procedure. A total of 3.0 mg Versed and 75 mg Fentanyl were administered intravenously. The patient's vital signs were monitored continuously by radiology nursing throughout the course of the procedure.  Total sedation time: 25 minutes  FLUOROSCOPY TIME:  30 seconds  TECHNIQUE: The right neck and chest was prepped with chlorhexidine, and draped in the usual sterile fashion using maximum barrier technique (cap and mask, sterile gown, sterile gloves, large sterile sheet, hand hygiene and cutaneous antiseptic). Antibiotic prophylaxis was provided with 2.0g Ancef administered IV one hour prior to skin incision. Local anesthesia was attained by infiltration with 1% lidocaine without epinephrine.  Ultrasound demonstrated patency of the right internal jugular vein, and this was documented with an image. Under real-time ultrasound guidance, this vein was accessed with a 21 gauge micropuncture needle and image documentation was performed. A small dermatotomy was made at the access site with an 11 scalpel. A 0.018" wire was advanced into the SVC and the access needle exchanged for a 43F micropuncture vascular sheath. The 0.018" wire was then removed and a 0.035" wire advanced into the IVC.  An appropriate location for the subcutaneous reservoir was selected below the clavicle and an incision was made through the skin and underlying soft tissues. The subcutaneous  tissues were then dissected using a combination of blunt and sharp surgical technique and a pocket was formed. A single lumen power injectable portacatheter was then tunneled through the subcutaneous tissues from the pocket to the dermatotomy and the port reservoir placed within the subcutaneous pocket.  The venous access site was then serially dilated and a peel away vascular sheath placed over the wire. The wire was removed and the port catheter advanced into position under fluoroscopic guidance. The catheter tip is positioned at the cavoatrial junction. This was documented with a spot image. The portacatheter was then tested and found to flush and aspirate well. The port was flushed with saline followed by 100 units/mL heparinized saline.  The pocket was then closed in two layers using first subdermal inverted interrupted absorbable sutures followed by a running subcuticular suture. The epidermis was then sealed with Dermabond. The dermatotomy at the venous access site was also sealed with Dermabond.  The patient tolerated the procedure well and remained hemodynamically stable throughout.  No complications were encountered and no significant blood loss was encounter.  The port catheter was left accessed, for her return trip to [place]: None.  FINDINGS: After placement of the catheter through the peel-away sheath  and removal, 2 projections on fluoroscopy were acquired given the appearance of the tip. The tip appears on 2 separate projections to be within the distal superior vena cava and not the azygos vein. It is favored that the memory of the catheter is giving the curved appearance laterally.  IMPRESSION: Status post placement of right-sided IJ port catheter with the catheter ready for use.  Signed,  Dulcy Fanny. Earleen Newport, DO  [facility]  [facility]   Electronically Signed   By: Corrie Mckusick D.O.   On: 05/17/2014 17:06   Ir US Guide Vasc  Access Right  05/17/2014   CLINICAL DATA:  49 year old female with a history of breast carcinoma. She has been referred for port catheter placement.  EXAM: IR RIGHT FLOURO GUIDE CV LINE; IR ULTRASOUND GUIDANCE VASC ACCESS RIGHT  Date: 05/17/2014  ANESTHESIA/SEDATION: Moderate (conscious) sedation was administered during this procedure. A total of 3.0 mg Versed and 75 mg Fentanyl were administered intravenously. The patient's vital signs were monitored continuously by radiology nursing throughout the course of the procedure.  Total sedation time: 25 minutes  FLUOROSCOPY TIME:  30 seconds  TECHNIQUE: The right neck and chest was prepped with chlorhexidine, and draped in the usual sterile fashion using maximum barrier technique (cap and mask, sterile gown, sterile gloves, large sterile sheet, hand hygiene and cutaneous antiseptic). Antibiotic prophylaxis was provided with 2.0g Ancef administered IV one hour prior to skin incision. Local anesthesia was attained by infiltration with 1% lidocaine without epinephrine.  Ultrasound demonstrated patency of the right internal jugular vein, and this was documented with an image. Under real-time ultrasound guidance, this vein was accessed with a 21 gauge micropuncture needle and image documentation was performed. A small dermatotomy was made at the access site with an 11 scalpel. A 0.018" wire was advanced into the SVC and the access needle exchanged for a 44F micropuncture vascular sheath. The 0.018" wire was then removed and a 0.035" wire advanced into the IVC.  An appropriate location for the subcutaneous reservoir was selected below the clavicle and an incision was made through the skin and underlying soft tissues. The subcutaneous tissues were then dissected using a combination of blunt and sharp surgical technique and a pocket was formed. A single lumen power injectable portacatheter was then tunneled through the subcutaneous tissues from the pocket to the dermatotomy and the  port reservoir placed within the subcutaneous pocket.  The venous access site was then serially dilated and a peel away vascular sheath placed over the wire. The wire was removed and the port catheter advanced into position under fluoroscopic guidance. The catheter tip is positioned at the cavoatrial junction. This was documented with a spot image. The portacatheter was then tested and found to flush and aspirate well. The port was flushed with saline followed by 100 units/mL heparinized saline.  The pocket was then closed in two layers using first subdermal inverted interrupted absorbable sutures followed by a running subcuticular suture. The epidermis was then sealed with Dermabond. The dermatotomy at the venous access site was also sealed with Dermabond.  The patient tolerated the procedure well and remained hemodynamically stable throughout.  No complications were encountered and no significant blood loss was encounter.  The port catheter was left accessed, for her return trip to [place]: None.  FINDINGS: After placement of the catheter through the peel-away sheath and removal, 2 projections on fluoroscopy were acquired given the appearance of the tip. The tip appears  on 2 separate projections to be within the distal superior vena cava and not the azygos vein. It is favored that the memory of the catheter is giving the curved appearance laterally.  IMPRESSION: Status post placement of right-sided IJ port catheter with the catheter ready for use.  Signed,  Dulcy Fanny. Earleen Newport, DO  [facility]  [facility]   Electronically Signed   By: Corrie Mckusick D.O.   On: 05/17/2014 17:06   Dg Abd 2 Views  05/16/2014   CLINICAL DATA:  Acute abdominal pain. Initial encounter. History of metastatic breast cancer.  EXAM: ABDOMEN - 2 VIEW  COMPARISON:  05/16/2014 PET-CT scan  FINDINGS: The bowel gas pattern is normal.  There is no evidence of free air.  No  suspicious calcifications are identified.  Lytic metastases within the proximal right femur and right ischium are noted as demonstrated by today's PET-CT.  IMPRESSION: Unremarkable bowel gas pattern.  Right bony metastases identified as seen on today's PET-CT. Other bony metastases are difficult to visualize on this study.   Electronically Signed   By: Hassan Rowan M.D.   On: 05/16/2014 19:27   Labs: Basic Metabolic Panel:  Recent Labs Lab 05/16/14 1946 05/17/14 0557  NA 135 136  K 4.1 4.3  CL 103 103  CO2 26 23  GLUCOSE 123* 180*  BUN 14 13  CREATININE 0.76 0.79  CALCIUM 9.1 9.9   Liver Function Tests:  Recent Labs Lab 05/16/14 1946 05/17/14 0557  AST 126* 133*  ALT 80* 86*  ALKPHOS 297* 321*  BILITOT 0.7 1.0  PROT 6.8 7.4  ALBUMIN 3.3* 3.6    Recent Labs Lab 05/16/14 1946  LIPASE 23   CBC:  Recent Labs Lab 05/16/14 1946 05/17/14 0557  WBC 11.7* 11.7*  HGB 11.8* 12.7  HCT 36.6 39.3  MCV 95.6 96.1  PLT 242 255   CBG:  Recent Labs Lab 05/16/14 1155  GLUCAP 84    Principal Problem:   Intractable abdominal pain Active Problems:   Adenocarcinoma determined by biopsy of liver   Breast cancer, stage 4   Time coordinating discharge: 35 minutes  Signed:  Murray Hodgkins, MD [facility] 05/18/2014, 3:09 PM

## 2014-05-18 NOTE — Progress Notes (Signed)
Emotional and spiritual support offered and accepted from patient and family.

## 2014-05-18 NOTE — Progress Notes (Signed)
[place] discharged home with husband per MD order.  Discharge instructions reviewed and discussed with the patient and husband at bedside, all questions and concerns answered. Copy of instructions and scripts given to patient.    Medication List    STOP taking these medications        fentaNYL 12 MCG/HR  Commonly known as:  DURAGESIC     oxyCODONE 5 MG immediate release tablet  Commonly known as:  Oxy IR/ROXICODONE      TAKE these medications        ALPRAZolam 0.5 MG tablet  Commonly known as:  XANAX  Take 1 tablet (0.5 mg total) by mouth 3 (three) times daily as needed for anxiety or sleep.     dexamethasone 4 MG tablet  Commonly known as:  DECADRON  Take 1 tablet (4 mg total) by mouth 2 (two) times daily with a meal.     HYDROcodone-acetaminophen 7.5-325 MG per tablet  Commonly known as:  NORCO  Take 1 tablet by mouth every 8 (eight) hours as needed for moderate pain.     morphine 15 MG 12 hr tablet  Commonly known as:  MS CONTIN  Take 1 tablet (15 mg total) by mouth every 12 (twelve) hours.     morphine 15 MG 12 hr tablet  Commonly known as:  MS CONTIN  Take 1 tablet (15 mg total) by mouth every 12 (twelve) hours.     ondansetron 8 MG tablet  Commonly known as:  ZOFRAN  Take 1 tablet (8 mg total) by mouth every 8 (eight) hours as needed for nausea or vomiting.     pantoprazole 40 MG tablet  Commonly known as:  PROTONIX  Take 1 tablet (40 mg total) by mouth daily.     sertraline 100 MG tablet  Commonly known as:  ZOLOFT  Take 150 mg by mouth every morning.     STOOL SOFTENER PO  Take 1 capsule by mouth daily.     SUMAtriptan 100 MG tablet  Commonly known as:  IMITREX  Take 100 mg by mouth as needed. foir migraine     tamoxifen 20 MG tablet  Commonly known as:  NOLVADEX  Take 1 tablet (20 mg total) by mouth daily.        Patients skin is clean, dry and intact, no evidence of skin break down. Porta cath de-accessed and catheter remains intact.  Site without signs and symptoms of complications. Dressing and pressure applied.  Patient escorted to car by T[place], NT in a wheelchair,  no distress noted upon discharge.  Regino Bellow 05/18/2014 4:24 PM

## 2014-05-20 NOTE — Assessment & Plan Note (Signed)
Pleasant 49 year old female who presented to the inpatient service at [facility] with chest pain, upper abdominal pain in early December. Imaging studies revealed diffuse metastatic disease in the liver and lytic bone disease. She is up-to-date on all of her well care including yearly mammograms and colonoscopy. She has had a breast reduction and recently underwent a hysterectomy, ovaries were not removed. Pathology is consistent with a metastatic adenocarcinoma that appears to be a breast primary. We need to add a HER-2 to her pathology.  I discussed with her my thoughts that this is a primary breast cancer. I advised her that we have additional testing to perform however. I have recommended another mammogram, breast MRI, PET scan, and additional pathologic studies as detailed. Given her worsening back pain I recommended plain films of the spine to rule out the possibility of an acute compression fracture. Currently she feels her pain medication is adequate. I addressed good pain control with Alexiya, including the use of long-acting narcotics if needed. I discussed with she and her husband presenting her case at the breast cancer multidisciplinary tumor Board. I will see her back once all the above studies are complete. She has met Hildred Alamin our patient navigator and I have advised her to call Hildred Alamin anytime with questions or concerns in the interim.

## 2014-05-21 ENCOUNTER — Other Ambulatory Visit (HOSPITAL_COMMUNITY): Payer: Self-pay | Admitting: *Deleted

## 2014-05-21 ENCOUNTER — Other Ambulatory Visit (HOSPITAL_COMMUNITY): Payer: Self-pay | Admitting: Hematology & Oncology

## 2014-05-21 ENCOUNTER — Encounter (HOSPITAL_COMMUNITY): Payer: 59 | Attending: Hematology & Oncology

## 2014-05-21 ENCOUNTER — Ambulatory Visit (HOSPITAL_COMMUNITY)
Admit: 2014-05-21 | Discharge: 2014-05-21 | Disposition: A | Discharge status: Home / Self Care | Payer: 59 | Source: Ambulatory Visit | Attending: Hematology & Oncology | Admitting: Hematology & Oncology

## 2014-05-21 DIAGNOSIS — C50919 Malignant neoplasm of unspecified site of unspecified female breast: Secondary | ICD-10-CM

## 2014-05-21 DIAGNOSIS — C7951 Secondary malignant neoplasm of bone: Secondary | ICD-10-CM | POA: Diagnosis not present

## 2014-05-21 DIAGNOSIS — C787 Secondary malignant neoplasm of liver and intrahepatic bile duct: Secondary | ICD-10-CM | POA: Insufficient documentation

## 2014-05-21 DIAGNOSIS — C801 Malignant (primary) neoplasm, unspecified: Secondary | ICD-10-CM | POA: Diagnosis present

## 2014-05-21 DIAGNOSIS — C229 Malignant neoplasm of liver, not specified as primary or secondary: Secondary | ICD-10-CM

## 2014-05-21 DIAGNOSIS — R938 Abnormal findings on diagnostic imaging of other specified body structures: Secondary | ICD-10-CM | POA: Insufficient documentation

## 2014-05-21 DIAGNOSIS — Z95828 Presence of other vascular implants and grafts: Secondary | ICD-10-CM | POA: Diagnosis not present

## 2014-05-21 LAB — CBC WITH DIFFERENTIAL/PLATELET
Basophils Absolute: 0 K/uL (ref 0.0–0.1)
Basophils Relative: 0 % (ref 0–1)
Eosinophils Absolute: 0 K/uL (ref 0.0–0.7)
Eosinophils Relative: 0 % (ref 0–5)
HCT: 37 % (ref 36.0–46.0)
Hemoglobin: 12 g/dL (ref 12.0–15.0)
Lymphocytes Relative: 14 % (ref 12–46)
Lymphs Abs: 1.9 K/uL (ref 0.7–4.0)
MCH: 31 pg (ref 26.0–34.0)
MCHC: 32.4 g/dL (ref 30.0–36.0)
MCV: 95.6 fL (ref 78.0–100.0)
Monocytes Absolute: 1.4 K/uL — ABNORMAL HIGH (ref 0.1–1.0)
Monocytes Relative: 10 % (ref 3–12)
Neutro Abs: 10.5 K/uL — ABNORMAL HIGH (ref 1.7–7.7)
Neutrophils Relative %: 76 % (ref 43–77)
Platelets: 271 K/uL (ref 150–400)
RBC: 3.87 MIL/uL (ref 3.87–5.11)
RDW: 15.2 % (ref 11.5–15.5)
WBC: 13.8 K/uL — ABNORMAL HIGH (ref 4.0–10.5)

## 2014-05-21 LAB — COMPREHENSIVE METABOLIC PANEL WITH GFR
ALT: 93 U/L — ABNORMAL HIGH (ref 0–35)
AST: 127 U/L — ABNORMAL HIGH (ref 0–37)
Albumin: 3.6 g/dL (ref 3.5–5.2)
Alkaline Phosphatase: 331 U/L — ABNORMAL HIGH (ref 39–117)
Anion gap: 9 (ref 5–15)
BUN: 22 mg/dL (ref 6–23)
CO2: 27 mmol/L (ref 19–32)
Calcium: 9.3 mg/dL (ref 8.4–10.5)
Chloride: 102 meq/L (ref 96–112)
Creatinine, Ser: 0.84 mg/dL (ref 0.50–1.10)
GFR calc Af Amer: >90 mL/min (ref >90)
GFR calc non Af Amer: 80 mL/min — ABNORMAL LOW (ref >90)
Glucose, Bld: 98 mg/dL (ref 70–99)
Potassium: 4.5 mmol/L (ref 3.5–5.1)
Sodium: 138 mmol/L (ref 135–145)
Total Bilirubin: 0.9 mg/dL (ref 0.3–1.2)
Total Protein: 7.3 g/dL (ref 6.0–8.3)

## 2014-05-21 MED ORDER — GADOBENATE DIMEGLUMINE 529 MG/ML IV SOLN
20.0000 mL | Freq: Once | INTRAVENOUS | Status: AC | PRN
  Administered 2014-05-21: 20 mL via INTRAVENOUS

## 2014-05-21 NOTE — Progress Notes (Signed)
LABS FOR [id number],CBCD,CMP

## 2014-05-22 ENCOUNTER — Encounter (HOSPITAL_BASED_OUTPATIENT_CLINIC_OR_DEPARTMENT_OTHER): Payer: 59

## 2014-05-22 ENCOUNTER — Encounter: Payer: Self-pay | Admitting: *Deleted

## 2014-05-22 ENCOUNTER — Encounter (HOSPITAL_COMMUNITY): Payer: Self-pay

## 2014-05-22 VITALS — BP 129/72 | HR 79 | Temp 98.3°F | Resp 18 | Wt 246.2 lb

## 2014-05-22 DIAGNOSIS — C787 Secondary malignant neoplasm of liver and intrahepatic bile duct: Secondary | ICD-10-CM

## 2014-05-22 DIAGNOSIS — C229 Malignant neoplasm of liver, not specified as primary or secondary: Secondary | ICD-10-CM

## 2014-05-22 DIAGNOSIS — C801 Malignant (primary) neoplasm, unspecified: Secondary | ICD-10-CM

## 2014-05-22 DIAGNOSIS — Z5112 Encounter for antineoplastic immunotherapy: Secondary | ICD-10-CM

## 2014-05-22 DIAGNOSIS — C50919 Malignant neoplasm of unspecified site of unspecified female breast: Secondary | ICD-10-CM

## 2014-05-22 DIAGNOSIS — Z23 Encounter for immunization: Secondary | ICD-10-CM

## 2014-05-22 DIAGNOSIS — C7951 Secondary malignant neoplasm of bone: Secondary | ICD-10-CM

## 2014-05-22 LAB — CANCER ANTIGEN 27.29: CA 27.29: 8167 U/mL — ABNORMAL HIGH (ref 0–39)

## 2014-05-22 MED ORDER — HEPARIN SOD (PORK) LOCK FLUSH 100 UNIT/ML IV SOLN
500.0000 [IU] | Freq: Once | INTRAVENOUS | Status: AC | PRN
  Administered 2014-05-22: 500 [IU]

## 2014-05-22 MED ORDER — DIPHENHYDRAMINE HCL 25 MG PO CAPS
50.0000 mg | ORAL_CAPSULE | Freq: Once | ORAL | Status: AC
  Administered 2014-05-22: 50 mg via ORAL
  Filled 2014-05-22: qty 2

## 2014-05-22 MED ORDER — TRASTUZUMAB CHEMO INJECTION 440 MG
8.0000 mg/kg | Freq: Once | INTRAVENOUS | Status: AC
  Administered 2014-05-22: 903 mg via INTRAVENOUS
  Filled 2014-05-22: qty 43

## 2014-05-22 MED ORDER — IBUPROFEN 200 MG PO TABS
400.0000 mg | ORAL_TABLET | Freq: Once | ORAL | Status: AC
  Administered 2014-05-22: 400 mg via ORAL

## 2014-05-22 MED ORDER — SODIUM CHLORIDE 0.9 % IV SOLN: 8.0000 mg | Freq: Once | INTRAVENOUS | Status: DC

## 2014-05-22 MED ORDER — SODIUM CHLORIDE 0.9 % IV SOLN
Freq: Once | INTRAVENOUS | Status: DC
  Filled 2014-05-22: qty 4

## 2014-05-22 MED ORDER — SODIUM CHLORIDE 0.9 % IV SOLN
Freq: Once | INTRAVENOUS | Status: AC
  Administered 2014-05-22: 10:00:00 via INTRAVENOUS

## 2014-05-22 MED ORDER — SODIUM CHLORIDE 0.9 % IV SOLN
840.0000 mg | Freq: Once | INTRAVENOUS | Status: AC
  Administered 2014-05-22: 840 mg via INTRAVENOUS
  Filled 2014-05-22: qty 28

## 2014-05-22 MED ORDER — DOCETAXEL CHEMO INJECTION 160 MG/16ML
75.0000 mg/m2 | Freq: Once | INTRAVENOUS | Status: DC
  Filled 2014-05-22: qty 17

## 2014-05-22 MED ORDER — ACETAMINOPHEN 325 MG PO TABS
650.0000 mg | ORAL_TABLET | Freq: Once | ORAL | Status: AC
  Administered 2014-05-22: 650 mg via ORAL
  Filled 2014-05-22: qty 2

## 2014-05-22 MED ORDER — HEPARIN SOD (PORK) LOCK FLUSH 100 UNIT/ML IV SOLN
INTRAVENOUS | Status: AC
  Filled 2014-05-22: qty 5

## 2014-05-22 MED ORDER — TETANUS-DIPHTH-ACELL PERTUSSIS 5-2.5-18.5 LF-MCG/0.5 IM SUSP
0.5000 mL | Freq: Once | INTRAMUSCULAR | Status: AC
  Administered 2014-05-22: 0.5 mL via INTRAMUSCULAR
  Filled 2014-05-22: qty 0.5

## 2014-05-22 MED ORDER — SODIUM CHLORIDE 0.9 % IJ SOLN: 10.0000 mL | INTRAMUSCULAR | Status: DC | PRN

## 2014-05-22 MED ORDER — DEXAMETHASONE SODIUM PHOSPHATE 10 MG/ML IJ SOLN: 10.0000 mg | Freq: Once | INTRAMUSCULAR | Status: DC

## 2014-05-22 MED ORDER — IBUPROFEN 200 MG PO TABS
ORAL_TABLET | ORAL | Status: AC
  Filled 2014-05-22: qty 2

## 2014-05-22 NOTE — Progress Notes (Unsigned)
What to Know After Chemo Given to Patient. Consent for Hercpetin, Perjeta, Depo Lupron, and Taxotere signed by patient. Taxotere and Depo Lupron will not be given today. We will more than likely start that with Cycle II. Patient was told this and understands.

## 2014-05-22 NOTE — Progress Notes (Unsigned)
Maleigha H Poland Tolerated chemotherapy today.  Discharged ambulatory  Harpreet H Virag's reason for visit today is for an injection Daniela H Sweaney also received TDAP injection per MD orders; see MAR for administration details.  Sibyl H Wimberley tolerated all procedures well and without incident; questions were answered and patient was discharged.

## 2014-05-22 NOTE — Progress Notes (Signed)
[facility] Psychosocial Distress Screening Clinical Social Work  Clinical Social Work was referred by distress screening protocol.  The patient scored a 5 on the Psychosocial Distress Thermometer which indicates moderate distress. Clinical Education officer, museum met with pt and her husband at [facility] during her first chemo to assess for distress and other psychosocial needs. Pt reports she has had issues with depression for many years and has been on zoloft for eight. She feels this works well for her and recently had her dose adjusted by her psychiatrist. She is followed by Dr. Clovis Pu at [facility]. She has made him aware of her diagnosis. She reports she also has found behavioral coping techniques that assist her and has found mediatation, reading and others to be most helpful. CSW discussed common emotions experienced by breast cancer patients and coping techniques. CSW met with pt at first MD appointment and CSW discussed Breast Cancer Support Group, [employer] and other resources to assist. Pt had questions about how to apply for ss disability and CSW walked pt through the process. CSW provided her with print out explaining application process and answered her questions. CSW also referred pt to Ernestene Kiel as she had many nutritional questions. CSW left message for Raford Pitcher and gave pt her card. Pt overall is coping well and has excellent support system. CSW to follow and assist as needed. Pt aware of how to contact CSW as needed.   ONCBCN DISTRESS SCREENING 05/18/2014  Screening Type Initial Screening  Distress experienced in past week (1-10) 5  Emotional problem type Depression;Nervousness/Anxiety;Adjusting to illness  Physical Problem type Sleep/insomnia  Physician notified of physical symptoms Yes  Referral to clinical social work Yes    Clinical Social Worker follow up needed: Yes.    If yes, follow up plan: See above [name], [place] Tuesdays 8:30-1pm Wednesdays  8:30-12pm  [phone]

## 2014-05-22 NOTE — Patient Instructions (Signed)
[facility] Discharge Instructions for Patients Receiving Chemotherapy  Today you received the following chemotherapy agents herceptin, perjeta Please follow up as scheduled, call the clinic if you have any questions or concerns  To help prevent nausea and vomiting after your treatment, we encourage you to take your nausea medication    If you develop nausea and vomiting that is not controlled by your nausea medication, call the clinic. If it is after clinic hours your family physician or the after hours number for the clinic or go to the Emergency Department.   BELOW ARE SYMPTOMS THAT SHOULD BE REPORTED IMMEDIATELY:  *FEVER GREATER THAN 101.0 F  *CHILLS WITH OR WITHOUT FEVER  NAUSEA AND VOMITING THAT IS NOT CONTROLLED WITH YOUR NAUSEA MEDICATION  *UNUSUAL SHORTNESS OF BREATH  *UNUSUAL BRUISING OR BLEEDING  TENDERNESS IN MOUTH AND THROAT WITH OR WITHOUT PRESENCE OF ULCERS  *URINARY PROBLEMS  *BOWEL PROBLEMS  UNUSUAL RASH Items with * indicate a potential emergency and should be followed up as soon as possible.  One of the nurses will contact you 24 hours after your treatment. Please let the nurse know about any problems that you may have experienced. Feel free to call the clinic you have any questions or concerns. The clinic phone number is [phone].   I have been informed and understand all the instructions given to me. I know to contact the clinic, my physician, or go to the Emergency Department if any problems should occur. I do not have any questions at this time, but understand that I may call the clinic during office hours or the Patient Navigator at [phone] should I have any questions or need assistance in obtaining follow up care.   Trastuzumab injection for infusion What is this medicine? TRASTUZUMAB (tras TOO zoo mab) is a monoclonal antibody. It targets a protein called HER2. This protein is found in some stomach and breast cancers. This  medicine can stop cancer cell growth. This medicine may be used with other cancer treatments. This medicine may be used for other purposes; ask your health care provider or pharmacist if you have questions. COMMON BRAND NAME(S): Herceptin What should I tell my health care provider before I take this medicine? They need to know if you have any of these conditions: -heart disease -heart failure -infection (especially a virus infection such as chickenpox, cold sores, or herpes) -lung or breathing disease, like asthma -recent or ongoing radiation therapy -an unusual or allergic reaction to trastuzumab, benzyl alcohol, or other medications, foods, dyes, or preservatives -pregnant or trying to get pregnant -breast-feeding How should I use this medicine? This drug is given as an infusion into a vein. It is administered in a hospital or clinic by a specially trained health care professional. Talk to your pediatrician regarding the use of this medicine in children. This medicine is not approved for use in children. Overdosage: If you think you have taken too much of this medicine contact a poison control center or emergency room at once. NOTE: This medicine is only for you. Do not share this medicine with others. What if I miss a dose? It is important not to miss a dose. Call your doctor or health care professional if you are unable to keep an appointment. What may interact with this medicine? -cyclophosphamide -doxorubicin -warfarin This list may not describe all possible interactions. Give your health care provider a list of all the medicines, herbs, non-prescription drugs, or dietary supplements you use. Also tell them  if you smoke, drink alcohol, or use illegal drugs. Some items may interact with your medicine. What should I watch for while using this medicine? Visit your doctor for checks on your progress. Report any side effects. Continue your course of treatment even though you feel ill  unless your doctor tells you to stop. Call your doctor or health care professional for advice if you get a fever, chills or sore throat, or other symptoms of a cold or flu. Do not treat yourself. Try to avoid being around people who are sick. You may experience fever, chills and shaking during your first infusion. These effects are usually mild and can be treated with other medicines. Report any side effects during the infusion to your health care professional. Fever and chills usually do not happen with later infusions. What side effects may I notice from receiving this medicine? Side effects that you should report to your doctor or other health care professional as soon as possible: -breathing difficulties -chest pain or palpitations -cough -dizziness or fainting -fever or chills, sore throat -skin rash, itching or hives -swelling of the legs or ankles -unusually weak or tired Side effects that usually do not require medical attention (report to your doctor or other health care professional if they continue or are bothersome): -loss of appetite -headache -muscle aches -nausea This list may not describe all possible side effects. Call your doctor for medical advice about side effects. You may report side effects to FDA at 1-800-FDA-1088. Where should I keep my medicine? This drug is given in a hospital or clinic and will not be stored at home. NOTE: This sheet is a summary. It may not cover all possible information. If you have questions about this medicine, talk to your doctor, pharmacist, or health care provider.  2015, Elsevier/Gold Standard. (2009-03-01 13:43:15)   Pertuzumab injection What is this medicine? PERTUZUMAB (per TOOZ ue mab) is a monoclonal antibody that targets a protein called HER2. HER2 is found in some breast cancers. This medicine can stop cancer cell growth. This medicine is used with other cancer treatments. This medicine may be used for other purposes; ask your  health care provider or pharmacist if you have questions. COMMON BRAND NAME(S): PERJETA What should I tell my health care provider before I take this medicine? They need to know if you have any of these conditions: -heart disease -heart failure -high blood pressure -history of irregular heart beat -recent or ongoing radiation therapy -an unusual or allergic reaction to pertuzumab, other medicines, foods, dyes, or preservatives -pregnant or trying to get pregnant -breast-feeding How should I use this medicine? This medicine is for infusion into a vein. It is given by a health care professional in a hospital or clinic setting. Talk to your pediatrician regarding the use of this medicine in children. Special care may be needed. Overdosage: If you think you've taken too much of this medicine contact a poison control center or emergency room at once. Overdosage: If you think you have taken too much of this medicine contact a poison control center or emergency room at once. NOTE: This medicine is only for you. Do not share this medicine with others. What if I miss a dose? It is important not to miss your dose. Call your doctor or health care professional if you are unable to keep an appointment. What may interact with this medicine? Interactions are not expected. Give your health care provider a list of all the medicines, herbs, non-prescription drugs, or dietary supplements  you use. Also tell them if you smoke, drink alcohol, or use illegal drugs. Some items may interact with your medicine. This list may not describe all possible interactions. Give your health care provider a list of all the medicines, herbs, non-prescription drugs, or dietary supplements you use. Also tell them if you smoke, drink alcohol, or use illegal drugs. Some items may interact with your medicine. What should I watch for while using this medicine? Your condition will be monitored carefully while you are receiving this  medicine. Report any side effects. Continue your course of treatment even though you feel ill unless your doctor tells you to stop. Do not become pregnant while taking this medicine. Women should inform their doctor if they wish to become pregnant or think they might be pregnant. There is a potential for serious side effects to an unborn child. Talk to your health care professional or pharmacist for more information. Do not breast-feed an infant while taking this medicine. Call your doctor or health care professional for advice if you get a fever, chills or sore throat, or other symptoms of a cold or flu. Do not treat yourself. Try to avoid being around people who are sick. You may experience fever, chills, and headache during the infusion. Report any side effects during the infusion to your health care professional. What side effects may I notice from receiving this medicine? Side effects that you should report to your doctor or health care professional as soon as possible: -breathing problems -chest pain or palpitations -dizziness -feeling faint or lightheaded -fever or chills -skin rash, itching or hives -sore throat -swelling of the face, lips, or tongue -swelling of the legs or ankles -unusually weak or tired Side effects that usually do not require medical attention (Report these to your doctor or health care professional if they continue or are bothersome.): -diarrhea -hair loss -nausea, vomiting -tiredness This list may not describe all possible side effects. Call your doctor for medical advice about side effects. You may report side effects to FDA at 1-800-FDA-1088. Where should I keep my medicine? This drug is given in a hospital or clinic and will not be stored at home. NOTE: This sheet is a summary. It may not cover all possible information. If you have questions about this medicine, talk to your doctor, pharmacist, or health care provider.  2015, Elsevier/Gold Standard.  (2012-02-24 16:54:15)

## 2014-05-23 ENCOUNTER — Telehealth (HOSPITAL_COMMUNITY): Payer: Self-pay | Admitting: *Deleted

## 2014-05-23 ENCOUNTER — Other Ambulatory Visit (INDEPENDENT_AMBULATORY_CARE_PROVIDER_SITE_OTHER): Payer: Self-pay | Admitting: *Deleted

## 2014-05-23 ENCOUNTER — Other Ambulatory Visit (HOSPITAL_COMMUNITY): Payer: Self-pay | Admitting: *Deleted

## 2014-05-23 ENCOUNTER — Other Ambulatory Visit (HOSPITAL_COMMUNITY): Payer: Self-pay | Admitting: Hematology & Oncology

## 2014-05-23 DIAGNOSIS — C229 Malignant neoplasm of liver, not specified as primary or secondary: Secondary | ICD-10-CM

## 2014-05-23 DIAGNOSIS — C7951 Secondary malignant neoplasm of bone: Secondary | ICD-10-CM | POA: Insufficient documentation

## 2014-05-23 DIAGNOSIS — C50919 Malignant neoplasm of unspecified site of unspecified female breast: Secondary | ICD-10-CM

## 2014-05-23 DIAGNOSIS — C799 Secondary malignant neoplasm of unspecified site: Secondary | ICD-10-CM

## 2014-05-23 NOTE — Telephone Encounter (Signed)
24h follow up: Patient doing ok today other than the fact that she is TIRED. She said between the Benadryl yesterday and the effects of the Herceptin/Perjeta today - she is tired. She has had some slight nausea but she took a nausea pill for that. She is otherwise ok. I instructed her that we are going to start giving her XGEVA every 30 days. I instructed her that she will need to sign a consent. I reviewed the talking points of XGEVA with her: osteonecrosis of the jaw (so notify us of any jaw pain/tenderness/discomfort), to not go to the dentist without letting us know that she is going and to notify her dentist that she is on XGEVA, and to take Calcium 1200mg  and Vit D 1000mg  daily. I instructed her how XGEVA works and that the Calcium/Vit D supplementation needed to be thought of as "part of her tx" vs a supplement. She said she understood how important it was to take the Calcium/Vit D. Dr. Laural Golden spoke to [facility] about an EGD however I don't know the results of that conversation. Elana aware that we are wanting her to have an EGD. Pt scheduled for MRI of lumbar/thoracic spine next Wed and then to see Tom on Thursday along with XGEVA injection. We are working on getting patient's tissue specimen sent to [facility] for Carcinoma of Unknown Primary to be extra sure of her Breast diagnosis. Pt made aware of all of these things.

## 2014-05-25 ENCOUNTER — Telehealth: Payer: Self-pay | Admitting: Nutrition

## 2014-05-25 ENCOUNTER — Other Ambulatory Visit (HOSPITAL_COMMUNITY): Admission: RE | Admit: 2014-05-25 | Payer: 59 | Source: Ambulatory Visit | Admitting: Hematology & Oncology

## 2014-05-25 ENCOUNTER — Ambulatory Visit (HOSPITAL_COMMUNITY)
Admission: RE | Admit: 2014-05-25 | Discharge: 2014-05-25 | Disposition: A | Discharge status: Home / Self Care | Payer: 59 | Source: Ambulatory Visit | Attending: Internal Medicine | Admitting: Internal Medicine

## 2014-05-25 ENCOUNTER — Encounter (HOSPITAL_COMMUNITY)
Admission: RE | Disposition: A | Discharge status: Home / Self Care | Payer: Self-pay | Source: Ambulatory Visit | Attending: Internal Medicine

## 2014-05-25 ENCOUNTER — Other Ambulatory Visit (HOSPITAL_COMMUNITY)
Admission: RE | Admit: 2014-05-25 | Discharge: 2014-05-25 | Disposition: A | Discharge status: Home / Self Care | Payer: 59 | Source: Ambulatory Visit | Attending: Hematology & Oncology | Admitting: Hematology & Oncology

## 2014-05-25 ENCOUNTER — Encounter (HOSPITAL_COMMUNITY): Payer: Self-pay

## 2014-05-25 DIAGNOSIS — G43909 Migraine, unspecified, not intractable, without status migrainosus: Secondary | ICD-10-CM | POA: Diagnosis not present

## 2014-05-25 DIAGNOSIS — C229 Malignant neoplasm of liver, not specified as primary or secondary: Secondary | ICD-10-CM

## 2014-05-25 DIAGNOSIS — C50919 Malignant neoplasm of unspecified site of unspecified female breast: Secondary | ICD-10-CM

## 2014-05-25 DIAGNOSIS — F329 Major depressive disorder, single episode, unspecified: Secondary | ICD-10-CM | POA: Insufficient documentation

## 2014-05-25 DIAGNOSIS — C801 Malignant (primary) neoplasm, unspecified: Secondary | ICD-10-CM | POA: Insufficient documentation

## 2014-05-25 DIAGNOSIS — K2960 Other gastritis without bleeding: Secondary | ICD-10-CM

## 2014-05-25 DIAGNOSIS — Z87891 Personal history of nicotine dependence: Secondary | ICD-10-CM | POA: Insufficient documentation

## 2014-05-25 DIAGNOSIS — K319 Disease of stomach and duodenum, unspecified: Secondary | ICD-10-CM | POA: Diagnosis not present

## 2014-05-25 DIAGNOSIS — C787 Secondary malignant neoplasm of liver and intrahepatic bile duct: Secondary | ICD-10-CM | POA: Diagnosis present

## 2014-05-25 DIAGNOSIS — C7951 Secondary malignant neoplasm of bone: Secondary | ICD-10-CM | POA: Insufficient documentation

## 2014-05-25 DIAGNOSIS — Z853 Personal history of malignant neoplasm of breast: Secondary | ICD-10-CM | POA: Diagnosis not present

## 2014-05-25 DIAGNOSIS — F419 Anxiety disorder, unspecified: Secondary | ICD-10-CM | POA: Diagnosis not present

## 2014-05-25 DIAGNOSIS — C799 Secondary malignant neoplasm of unspecified site: Secondary | ICD-10-CM

## 2014-05-25 DIAGNOSIS — R109 Unspecified abdominal pain: Secondary | ICD-10-CM

## 2014-05-25 HISTORY — PX: ESOPHAGOGASTRODUODENOSCOPY: SHX5428

## 2014-05-25 SURGERY — EGD (ESOPHAGOGASTRODUODENOSCOPY)
Anesthesia: Moderate Sedation

## 2014-05-25 MED ORDER — MEPERIDINE HCL 50 MG/ML IJ SOLN
INTRAMUSCULAR | Status: DC
Start: 2014-05-25 — End: 2014-05-25
  Filled 2014-05-25: qty 1

## 2014-05-25 MED ORDER — MIDAZOLAM HCL 5 MG/5ML IJ SOLN
INTRAMUSCULAR | Status: DC | PRN
  Administered 2014-05-25: 2 mg via INTRAVENOUS
  Administered 2014-05-25: 3 mg via INTRAVENOUS
  Administered 2014-05-25 (×2): 2 mg via INTRAVENOUS

## 2014-05-25 MED ORDER — SODIUM CHLORIDE 0.9 % IV SOLN
INTRAVENOUS | Status: DC
  Administered 2014-05-25: 13:00:00 via INTRAVENOUS

## 2014-05-25 MED ORDER — MEPERIDINE HCL 50 MG/ML IJ SOLN
INTRAMUSCULAR | Status: DC | PRN
  Administered 2014-05-25 (×2): 25 mg via INTRAVENOUS

## 2014-05-25 MED ORDER — MIDAZOLAM HCL 5 MG/5ML IJ SOLN
INTRAMUSCULAR | Status: AC
  Filled 2014-05-25: qty 10

## 2014-05-25 MED ORDER — BUTAMBEN-TETRACAINE-BENZOCAINE 2-2-14 % EX AERO
INHALATION_SPRAY | CUTANEOUS | Status: DC | PRN
  Administered 2014-05-25: 2 via TOPICAL

## 2014-05-25 MED ORDER — STERILE WATER FOR IRRIGATION IR SOLN
Status: DC | PRN
  Administered 2014-05-25: 14:00:00

## 2014-05-25 NOTE — H&P (Signed)
[name] is an 49 y.o. female.   Chief Complaint: Patient is here for EGD. HPI: Patient is 49 year old Caucasian female was recently diagnosed with metastatic carcinoma liver. Her disease is felt to be metastatic from breast however prominent cannot be found. She is undergoing EGD to make sure her upper GI tract is negative. She had an episode of chest pain about 4 weeks and when she was hospitalized. She does not have frequent heartburn dysphagia nausea or vomiting. She also denies melena. Her appetite is not good. She has lost 12-15 pounds in one month. She had colonoscopy in March last year with removal of small polyp and was sessile serrated polyp.  Past Medical History  Diagnosis Date  . PONV (postoperative nausea and vomiting)     pt states scope patch does well  . Anemia   . Depression   . Anxiety   . Headache(784.0)     has migraines - medication controls  . Breast cancer 04/2014    Past Surgical History  Procedure Laterality Date  . Right rotator cuff      2002  . Neck fusion      2003  . Laparoscopic cholecystectomy      2004  . Right knee arthroscopy      2005  . Colon surgery      2008  . Appendectomy      2008  . Abdominal hysterectomy    . Breast reduction surgery  03/17/2011    Procedure: MAMMARY REDUCTION BILATERAL (BREAST);  Surgeon: Mary A Contogiannis;  Location: [place];  Service: Plastics;  Laterality: Bilateral;  . Colonoscopy N/A 07/13/2013    Procedure: COLONOSCOPY;  Surgeon: Rogene Houston, MD;  Location[facility];  Service: Endoscopy;  Laterality: N/A;  930  . Liver biopsy  04/2014  . Portacath placement      Family History  Problem Relation Age of Onset  . Colon cancer Neg Hx   . Diabetes Father   . Heart attack Maternal Grandmother 30    multiple over lifetime.   Social History:  reports that she has quit smoking. Her smoking use included Cigarettes. She has never used smokeless tobacco. She reports that she drinks  alcohol. She reports that she does not use illicit drugs.  Allergies: No Known Allergies  Medications Prior to Admission  Medication Sig Dispense Refill  . ALPRAZolam (XANAX) 0.5 MG tablet Take 1 tablet (0.5 mg total) by mouth 3 (three) times daily as needed for anxiety or sleep. 10 tablet 0  . calcium citrate-vitamin D (CITRACAL+D) 315-200 MG-UNIT per tablet Take 1 tablet by mouth 2 (two) times daily.    Marland Kitchen dexamethasone (DECADRON) 4 MG tablet Take 1 tablet (4 mg total) by mouth 2 (two) times daily with a meal. 60 tablet 0  . diphenhydrAMINE (BENADRYL) 25 mg capsule Take 25 mg by mouth every 6 (six) hours as needed for itching.    Mariane Baumgarten Calcium (STOOL SOFTENER PO) Take 1 capsule by mouth 2 (two) times daily.     Marland Kitchen HYDROcodone-acetaminophen (NORCO) 7.5-325 MG per tablet Take 1 tablet by mouth every 8 (eight) hours as needed for moderate pain.    Marland Kitchen lidocaine-prilocaine (EMLA) cream Apply 1 application topically daily as needed (apply to port before chemo).     Marland Kitchen morphine (MS CONTIN) 15 MG 12 hr tablet Take 1 tablet (15 mg total) by mouth every 12 (twelve) hours. 60 tablet 0  . naproxen sodium (ANAPROX) 220 MG tablet  Take 220 mg by mouth 2 (two) times daily as needed (pain).    . ondansetron (ZOFRAN) 8 MG tablet Take 1 tablet (8 mg total) by mouth every 8 (eight) hours as needed for nausea or vomiting. 30 tablet 1  . pantoprazole (PROTONIX) 40 MG tablet Take 1 tablet (40 mg total) by mouth daily. 30 tablet 3  . polyethylene glycol (MIRALAX / GLYCOLAX) packet Take 17 g by mouth daily as needed for mild constipation.    . sertraline (ZOLOFT) 100 MG tablet Take 200 mg by mouth daily.     . tamoxifen (NOLVADEX) 20 MG tablet Take 1 tablet (20 mg total) by mouth daily. 30 tablet 2  . SUMAtriptan (IMITREX) 100 MG tablet Take 100 mg by mouth as needed. foir migraine      No results found for this or any previous visit (from the past 48 hour(s)). No results found.  ROS  Blood pressure 130/74,  pulse 65, temperature 98.4 F (36.9 C), temperature source Oral, resp. rate 14, height 5' 8.5" (1.74 m), weight 244 lb (110.678 kg), SpO2 93 %. Physical Exam  Constitutional: She appears well-developed and well-nourished.  HENT:  Mouth/Throat: Oropharynx is clear and moist.  Eyes: Conjunctivae are normal. No scleral icterus.  Neck: No thyromegaly present.  Cardiovascular: Normal rate, regular rhythm and normal heart sounds.   No murmur heard. Respiratory: Effort normal and breath sounds normal.  GI: Soft. She exhibits no mass. Tenderness: tender hepatomegaly. There is no guarding.  Musculoskeletal: She exhibits no edema.  Lymphadenopathy:    She has no cervical adenopathy.  Neurological: She is alert.  Skin: Skin is warm and dry.     Assessment/Plan Metastatic disease to liver and bones. EGD to rule out gastric primary.  Olympia Adelsberger U 05/25/2014, 2:06 PM

## 2014-05-25 NOTE — Discharge Instructions (Signed)
Keep Naprosyn use to minimum. Resume other medications as before. No driving for 24 hours. Physician will call with biopsy results.  Gastrointestinal Endoscopy, Care After Refer to this sheet in the next few weeks. These instructions provide you with information on caring for yourself after your procedure. Your caregiver may also give you more specific instructions. Your treatment has been planned according to current medical practices, but problems sometimes occur. Call your caregiver if you have any problems or questions after your procedure. HOME CARE INSTRUCTIONS  If you were given medicine to help you relax (sedative), do not drive, operate machinery, or sign important documents for 24 hours.  Avoid alcohol and hot or warm beverages for the first 24 hours after the procedure.  Only take over-the-counter or prescription medicines for pain, discomfort, or fever as directed by your caregiver. You may resume taking your normal medicines unless your caregiver tells you otherwise. Ask your caregiver when you may resume taking medicines that may cause bleeding, such as aspirin, clopidogrel, or warfarin.  You may return to your normal diet and activities on the day after your procedure, or as directed by your caregiver. Walking may help to reduce any bloated feeling in your abdomen.  Drink enough fluids to keep your urine clear or pale yellow.  You may gargle with salt water if you have a sore throat. SEEK IMMEDIATE MEDICAL CARE IF:  You have severe nausea or vomiting.  You have severe abdominal pain, abdominal cramps that last longer than 6 hours, or abdominal swelling (distention).  You have severe shoulder or back pain.  You have trouble swallowing.  You have shortness of breath, your breathing is shallow, or you are breathing faster than normal.  You have a fever or a rapid heartbeat.  You vomit blood or material that looks like coffee grounds.  You have bloody, black, or tarry  stools. MAKE SURE YOU:  Understand these instructions.  Will watch your condition.  Will get help right away if you are not doing well or get worse. Document Released: 12/10/2003 Document Revised: 09/11/2013 Document Reviewed: 07/28/2011 [facility] Patient Information 2015 [place], [place]. This information is not intended to replace advice given to you by your health care provider. Make sure you discuss any questions you have with your health care provider.

## 2014-05-25 NOTE — Op Note (Signed)
EGD PROCEDURE REPORT  PATIENT:  [name]  MR#:  [id number] Birthdate:  [birth year], 49 y.o., female Endoscopist:  Dr. Rogene Houston, MD Referred By:  Robynn Pane, PA-C/Shannon Marquette Old MD  Procedure Date: 05/25/2014  Procedure:   EGD  Indications:  Patient is 49 year old Caucasian female was metastatic disease involving liver and bones and promptly felt to be breast. Nadara Mustard no promptly has been located so far. She is undergoing EGD to make sure she does not have gastric primary leading to metastatic disease. She has been experiencing right upper quadrant pain felt to be secondary to liver metastases. She also had an episode of chest pain 4 weeks ago when she was hospitalized and source of her pain was felt to be liver disease.           Informed Consent:  The risks, benefits, alternatives & imponderables which include, but are not limited to, bleeding, infection, perforation, drug reaction and potential missed lesion have been reviewed.  The potential for biopsy, lesion removal, esophageal dilation, etc. have also been discussed.  Questions have been answered.  All parties agreeable.  Please see history & physical in medical record for more information.  Medications:  Demerol 50 mg IV Versed 9 mg IV Cetacaine spray topically for oropharyngeal anesthesia  Description of procedure:  The endoscope was introduced through the mouth and advanced to the second portion of the duodenum without difficulty or limitations. The mucosal surfaces were surveyed very carefully during advancement of the scope and upon withdrawal.  Findings:  Esophagus:  Mucosa of the esophagus was normal. GE junction was unremarkable. GEJ:  37 cm from incisors. Stomach:  Stomach was empty and distended very well with insufflation. Folds in the proximal stomach were normal. Examination was at gastric body was normal. There was erythema and edema to antral mucosa along with few erosions in the background of raised  or elevated mucosa. Pyloric channel was patent. Angularis fundus and cardia were unremarkable. Duodenum:  Normal bulbar and post bulbar mucosa.  Therapeutic/Diagnostic Maneuvers Performed:  Biopsy was taken from the antral erosions for routine histology.  Complications:  None  Impression: Erosive antral gastritis otherwise normal EGD. Antral biopsy taken. No evidence of gastric primary.  Recommendations:  Standard instructions given. I will be contacting patient with biopsy results next week. Follow-up with Dr. Whitney Muse as planned.  Taevyn Hausen U  05/25/2014  2:35 PM  CC: Dr. [name], MD & Dr. Rayne Du ref. provider found CC: Robynn Pane, PA-C/ Molli Hazard, MD

## 2014-05-25 NOTE — Telephone Encounter (Signed)
Received a referral from social worker to contact patient who has questions regarding nutrition. Contacted patient by phone. Patient reports appetite has been poor.  However, she enjoys drinking boost breeze. Patient requesting information on where she can purchase this product. Recommended patient purchase directly from [facility] or order online. Patient appreciative of information and agrees to contact me further if she has any other questions or concerns. She has my contact information.  **Disclaimer: This note was dictated with voice recognition software. Similar sounding words can inadvertently be transcribed and this note may contain transcription errors which may not have been corrected upon publication of note.**

## 2014-05-26 NOTE — Progress Notes (Unsigned)
Allison Labrum, MD [place] [address]  Stage IV adenocarcinoma with metastases to liver and bone  Had last mammogram in [place] this year, Dr. Gaetano Net  Colonoscopy 05/18/2013  Breast Reduction 03/17/2011  CURRENT THERAPY:  INTERVAL HISTORY: [name] 49 y.o. female returns for follow-up of newly diagnosed stage IV adenocarcinoma.  MEDICAL HISTORY: Past Medical History  Diagnosis Date  . PONV (postoperative nausea and vomiting)     pt states scope patch does well  . Anemia   . Depression   . Anxiety   . Headache(784.0)     has migraines - medication controls  . Breast cancer 04/2014    has Hypertrophy of breast; Chest pain, rule out acute myocardial infarction; Hyperlipidemia; Depression; Hypokalemia; Elevated LFTs; Liver lesion; Adenocarcinoma determined by biopsy of liver; Intractable abdominal pain; Breast cancer, stage 4; Metastatic adenocarcinoma; and Bone metastases on her problem list.      Adenocarcinoma determined by biopsy of liver   04/24/2014 Imaging U/S with multiple hypoechoic lesions throughout the liver   04/25/2014 Imaging CT C/A/P with widespread disease throughout the liver and multiple lytic lesions throughout the spine and pelvis   04/27/2014 Pathology Results U/S guided biopsy with adenocarcinoma CK-7 +, ER + and patchy positivity with PR   05/08/2014 Initial Diagnosis Adenocarcinoma determined by biopsy of liver    Breast cancer, stage 4   04/25/2014 Initial Diagnosis Breast cancer, stage 4   04/25/2014 Imaging CT abdomen/pelvia with widespread metastatic disease to the liver, multiple lytic lesions throughout spine and pelvis. No FX or epidural tumor identified   04/26/2014 Imaging CT head unremarkable   04/26/2014 Imaging CT chest with no lung mass or pulmonary nodules, no adenopathy. Lytic bone lesions, right 2nd rib   04/27/2014 Initial Biopsy U/S guided liver biopsy, lesion in anterior and inferior left hepatic lobe  biopsied   04/27/2014 Pathology Results Metastatic adenocarcinoma, CK7, ER+, patchy positivity with PR. Possible primary includes breast, less likely gynecologic     has No Known Allergies.  [name] does not currently have medications on file.  SURGICAL HISTORY: Past Surgical History  Procedure Laterality Date  . Right rotator cuff      2002  . Neck fusion      2003  . Laparoscopic cholecystectomy      2004  . Right knee arthroscopy      2005  . Colon surgery      2008  . Appendectomy      2008  . Abdominal hysterectomy    . Breast reduction surgery  03/17/2011    Procedure: MAMMARY REDUCTION BILATERAL (BREAST);  Surgeon: Mary A Contogiannis;  Location: [facility];  Service: Plastics;  Laterality: Bilateral;  . Colonoscopy N/A 07/13/2013    Procedure: COLONOSCOPY;  Surgeon: Rogene Houston, MD;  [facility];  Service: Endoscopy;  Laterality: N/A;  930  . Liver biopsy  04/2014  . Portacath placement      SOCIAL HISTORY: History   Social History  . Marital Status: Married    Spouse Name: N/A    Number of Children: N/A  . Years of Education: N/A   Occupational History  . Not on file.   Social History Main Topics  . Smoking status: Former Smoker    Types: Cigarettes  . Smokeless tobacco: Never Used  . Alcohol Use: Yes     Comment: Occasionally  . Drug Use: No  . Sexual Activity: Yes  Birth Control/ Protection: Surgical   Other Topics Concern  . Not on file   Social History Narrative    FAMILY HISTORY: Family History  Problem Relation Age of Onset  . Colon cancer Neg Hx   . Diabetes Father   . Heart attack Maternal Grandmother 30    multiple over lifetime.    ROS  PHYSICAL EXAMINATION  ECOG PERFORMANCE STATUS: {CHL ONC ECOG [phone]}  There were no vitals filed for this visit.  Physical Exam  LABORATORY DATA:  CBC    Component Value Date/Time   WBC 13.8* 05/21/2014 1104   RBC 3.87 05/21/2014 1104   HGB  12.0 05/21/2014 1104   HCT 37.0 05/21/2014 1104   PLT 271 05/21/2014 1104   MCV 95.6 05/21/2014 1104   MCH 31.0 05/21/2014 1104   MCHC 32.4 05/21/2014 1104   RDW 15.2 05/21/2014 1104   LYMPHSABS 1.9 05/21/2014 1104   MONOABS 1.4* 05/21/2014 1104   EOSABS 0.0 05/21/2014 1104   BASOSABS 0.0 05/21/2014 1104   CMP     Component Value Date/Time   NA 138 05/21/2014 1104   K 4.5 05/21/2014 1104   CL 102 05/21/2014 1104   CO2 27 05/21/2014 1104   GLUCOSE 98 05/21/2014 1104   BUN 22 05/21/2014 1104   CREATININE 0.84 05/21/2014 1104   CALCIUM 9.3 05/21/2014 1104   PROT 7.3 05/21/2014 1104   ALBUMIN 3.6 05/21/2014 1104   AST 127* 05/21/2014 1104   ALT 93* 05/21/2014 1104   ALKPHOS 331* 05/21/2014 1104   BILITOT 0.9 05/21/2014 1104   GFRNONAA 80* 05/21/2014 1104   GFRAA >90 05/21/2014 1104     PENDING LABS:   RADIOGRAPHIC STUDIES:  No results found.   PATHOLOGY:     ASSESSMENT and THERAPY PLAN:    No problem-specific assessment & plan notes found for this encounter.   All questions were answered. The patient knows to call the clinic with any problems, questions or concerns. We can certainly see the patient much sooner if necessary.  I spent {CHL ONC TIME VISIT - WYSHU:[id number]} counseling the patient face to face. The total time spent in the appointment was {CHL ONC TIME VISIT - BZMCE:[id number]}.  Molli Hazard 05/26/2014

## 2014-05-27 NOTE — Progress Notes (Signed)
-  Rescheduled-  [name]  

## 2014-05-27 NOTE — Assessment & Plan Note (Signed)
Stage IV adenocarcinoma, unknown primary, suspect breast.  ER/PR+ and Her2+.  Started Herceptin/Perjeta/Tamoxifen on 05/22/2014.  Tolerating therapy well thus far.  Pre-chemo labs every 21 days: CBC diff, CMET, CA 27.29.  Return in 2 weeks for follow-up and start of cycle 2.

## 2014-05-27 NOTE — Assessment & Plan Note (Signed)
On Ca++, Vit D, and Xgeva beginning today.

## 2014-05-28 ENCOUNTER — Encounter (HOSPITAL_COMMUNITY): Payer: Self-pay | Admitting: Internal Medicine

## 2014-05-28 ENCOUNTER — Telehealth (HOSPITAL_COMMUNITY): Payer: Self-pay | Admitting: *Deleted

## 2014-05-28 DIAGNOSIS — C50919 Malignant neoplasm of unspecified site of unspecified female breast: Secondary | ICD-10-CM

## 2014-05-28 MED ORDER — ZOLPIDEM TARTRATE 10 MG PO TABS: 10.0000 mg | ORAL_TABLET | Freq: Every evening | ORAL | Status: DC | PRN

## 2014-05-28 NOTE — Telephone Encounter (Signed)
Patient instructed to cut back on Dexamethasone. Pt currently taking Dex 4mg  BID. Patient instructed to cut back to Dex 4mg  daily. Patient also written a RX for Ambien to aid her in sleeping. Pt verbalized understanding of instructions.

## 2014-05-30 ENCOUNTER — Ambulatory Visit (HOSPITAL_COMMUNITY)
Admission: RE | Admit: 2014-05-30 | Discharge: 2014-05-30 | Disposition: A | Discharge status: Home / Self Care | Payer: 59 | Source: Ambulatory Visit | Attending: Hematology & Oncology | Admitting: Hematology & Oncology

## 2014-05-30 ENCOUNTER — Encounter (HOSPITAL_BASED_OUTPATIENT_CLINIC_OR_DEPARTMENT_OTHER): Payer: 59

## 2014-05-30 ENCOUNTER — Encounter (HOSPITAL_COMMUNITY): Payer: Self-pay

## 2014-05-30 ENCOUNTER — Ambulatory Visit (HOSPITAL_COMMUNITY): Payer: 59

## 2014-05-30 ENCOUNTER — Ambulatory Visit (HOSPITAL_COMMUNITY): Payer: 59 | Admitting: Oncology

## 2014-05-30 DIAGNOSIS — C229 Malignant neoplasm of liver, not specified as primary or secondary: Secondary | ICD-10-CM

## 2014-05-30 DIAGNOSIS — C50919 Malignant neoplasm of unspecified site of unspecified female breast: Secondary | ICD-10-CM | POA: Diagnosis not present

## 2014-05-30 DIAGNOSIS — C787 Secondary malignant neoplasm of liver and intrahepatic bile duct: Secondary | ICD-10-CM | POA: Diagnosis not present

## 2014-05-30 DIAGNOSIS — R1013 Epigastric pain: Secondary | ICD-10-CM | POA: Insufficient documentation

## 2014-05-30 DIAGNOSIS — C7951 Secondary malignant neoplasm of bone: Secondary | ICD-10-CM

## 2014-05-30 DIAGNOSIS — C801 Malignant (primary) neoplasm, unspecified: Secondary | ICD-10-CM

## 2014-05-30 MED ORDER — MORPHINE SULFATE ER 30 MG PO TBCR: 30.0000 mg | EXTENDED_RELEASE_TABLET | Freq: Two times a day (BID) | ORAL | Status: DC

## 2014-05-30 MED ORDER — DENOSUMAB 120 MG/1.7ML ~~LOC~~ SOLN
120.0000 mg | Freq: Once | SUBCUTANEOUS | Status: AC
  Administered 2014-05-30: 120 mg via SUBCUTANEOUS
  Filled 2014-05-30: qty 1.7

## 2014-05-30 MED ORDER — GADOBENATE DIMEGLUMINE 529 MG/ML IV SOLN
20.0000 mL | Freq: Once | INTRAVENOUS | Status: AC | PRN
  Administered 2014-05-30: 20 mL via INTRAVENOUS

## 2014-05-30 NOTE — Progress Notes (Signed)
Consent obtained for XGEVA. XGEVA teaching done last week via phone and XGEVA handout given to patient.

## 2014-05-30 NOTE — Patient Instructions (Addendum)
[place] at [facility] Discharge Instructions  RECOMMENDATIONS MADE BY THE CONSULTANT AND ANY TEST RESULTS WILL BE SENT TO YOUR REFERRING PHYSICIAN.  We are going to add Taxotere to your next treatment because this will help [place] get a faster response.   We got your pathology results from the special testing center and it definitely showed breast cancer.   We are starting XGEVA today. Please make sure we know of any jaw pain/tenderness/soreness etc. Do not go to the dentist without letting us know beforehand. Always always always tell your dentist about your chemo medications and XGEVA.  You do not have to come tomorrow if the weather is bad.   We will see you at next MD visit.   We are increasing your Morphine to 30mg  BID because of you waiting too long to take your breakthrough medications. We want you comfortable when you travel. Guard your medication while you are gone!! Have fun! Be safe!!!   Thank you for choosing [place] at [facility] to provide your oncology and hematology care.  To afford each patient quality time with our provider, please arrive at least 15 minutes before your scheduled appointment time.    You need to re-schedule your appointment should you arrive 10 or more minutes late.  We strive to give you quality time with our providers, and arriving late affects you and other patients whose appointments are after yours.  Also, if you no show three or more times for appointments you may be dismissed from the clinic at the providers discretion.     Again, thank you for choosing [facility].  Our hope is that these requests will decrease the amount of time that you wait before being seen by our physicians.       _____________________________________________________________  Should you have questions after your visit to [facility], please contact our office at [phone] between the hours of 8:30  a.m. and 4:30 p.m.  Voicemails left after 4:30 p.m. will not be returned until the following business day.  For prescription refill requests, have your pharmacy contact our office.   Denosumab injection What is this medicine? DENOSUMAB (den oh sue mab) slows bone breakdown. Prolia is used to treat osteoporosis in women after menopause and in men. Delton See is used to prevent bone fractures and other bone problems caused by cancer bone metastases. Delton See is also used to treat giant cell tumor of the bone. This medicine may be used for other purposes; ask your health care provider or pharmacist if you have questions. COMMON BRAND NAME(S): Prolia, XGEVA What should I tell my health care provider before I take this medicine? They need to know if you have any of these conditions: -dental disease -eczema -infection or history of infections -kidney disease or on dialysis -low blood calcium or vitamin D -malabsorption syndrome -scheduled to have surgery or tooth extraction -taking medicine that contains denosumab -thyroid or parathyroid disease -an unusual reaction to denosumab, other medicines, foods, dyes, or preservatives -pregnant or trying to get pregnant -breast-feeding How should I use this medicine? This medicine is for injection under the skin. It is given by a health care professional in a hospital or clinic setting. If you are getting Prolia, a special MedGuide will be given to you by the pharmacist with each prescription and refill. Be sure to read this information carefully each time. For Prolia, talk to your pediatrician regarding the use of this  medicine in children. Special care may be needed. For Delton See, talk to your pediatrician regarding the use of this medicine in children. While this drug may be prescribed for children as young as 13 years for selected conditions, precautions do apply. Overdosage: If you think you've taken too much of this medicine contact a poison control center or  emergency room at once. Overdosage: If you think you have taken too much of this medicine contact a poison control center or emergency room at once. NOTE: This medicine is only for you. Do not share this medicine with others. What if I miss a dose? It is important not to miss your dose. Call your doctor or health care professional if you are unable to keep an appointment. What may interact with this medicine? Do not take this medicine with any of the following medications: -other medicines containing denosumab This medicine may also interact with the following medications: -medicines that suppress the immune system -medicines that treat cancer -steroid medicines like prednisone or cortisone This list may not describe all possible interactions. Give your health care provider a list of all the medicines, herbs, non-prescription drugs, or dietary supplements you use. Also tell them if you smoke, drink alcohol, or use illegal drugs. Some items may interact with your medicine. What should I watch for while using this medicine? Visit your doctor or health care professional for regular checks on your progress. Your doctor or health care professional may order blood tests and other tests to see how you are doing. Call your doctor or health care professional if you get a cold or other infection while receiving this medicine. Do not treat yourself. This medicine may decrease your body's ability to fight infection. You should make sure you get enough calcium and vitamin D while you are taking this medicine, unless your doctor tells you not to. Discuss the foods you eat and the vitamins you take with your health care professional. See your dentist regularly. Brush and floss your teeth as directed. Before you have any dental work done, tell your dentist you are receiving this medicine. Do not become pregnant while taking this medicine or for 5 months after stopping it. Women should inform their doctor if they  wish to become pregnant or think they might be pregnant. There is a potential for serious side effects to an unborn child. Talk to your health care professional or pharmacist for more information. What side effects may I notice from receiving this medicine? Side effects that you should report to your doctor or health care professional as soon as possible: -allergic reactions like skin rash, itching or hives, swelling of the face, lips, or tongue -breathing problems -chest pain -fast, irregular heartbeat -feeling faint or lightheaded, falls -fever, chills, or any other sign of infection -muscle spasms, tightening, or twitches -numbness or tingling -skin blisters or bumps, or is dry, peels, or red -slow healing or unexplained pain in the mouth or jaw -unusual bleeding or bruising Side effects that usually do not require medical attention (Report these to your doctor or health care professional if they continue or are bothersome.): -muscle pain -stomach upset, gas This list may not describe all possible side effects. Call your doctor for medical advice about side effects. You may report side effects to FDA at 1-800-FDA-1088. Where should I keep my medicine? This medicine is only given in a clinic, doctor's office, or other health care setting and will not be stored at home. NOTE: This sheet is a summary. It  may not cover all possible information. If you have questions about this medicine, talk to your doctor, pharmacist, or health care provider.  2015, Elsevier/Gold Standard. (2011-10-26 12:37:47)

## 2014-05-30 NOTE — Progress Notes (Signed)
[name] presents today for injection per MD orders. XGEVA 120mg  administered SQ in left Abdomen. Administration without incident. Patient tolerated well.

## 2014-05-31 ENCOUNTER — Other Ambulatory Visit (HOSPITAL_COMMUNITY): Payer: Self-pay | Admitting: *Deleted

## 2014-05-31 ENCOUNTER — Ambulatory Visit (HOSPITAL_COMMUNITY): Payer: 59

## 2014-05-31 ENCOUNTER — Ambulatory Visit (HOSPITAL_COMMUNITY): Payer: 59 | Admitting: Oncology

## 2014-05-31 DIAGNOSIS — C50919 Malignant neoplasm of unspecified site of unspecified female breast: Secondary | ICD-10-CM

## 2014-05-31 MED ORDER — HYDROCODONE-ACETAMINOPHEN 7.5-325 MG PO TABS: 1.0000 | ORAL_TABLET | Freq: Three times a day (TID) | ORAL | Status: DC | PRN

## 2014-06-04 ENCOUNTER — Encounter (INDEPENDENT_AMBULATORY_CARE_PROVIDER_SITE_OTHER): Payer: Self-pay | Admitting: *Deleted

## 2014-06-11 ENCOUNTER — Encounter (HOSPITAL_COMMUNITY): Payer: Self-pay | Admitting: *Deleted

## 2014-06-12 ENCOUNTER — Encounter (HOSPITAL_BASED_OUTPATIENT_CLINIC_OR_DEPARTMENT_OTHER): Payer: 59 | Admitting: Hematology & Oncology

## 2014-06-12 ENCOUNTER — Encounter (HOSPITAL_COMMUNITY): Payer: Self-pay | Admitting: Hematology & Oncology

## 2014-06-12 ENCOUNTER — Encounter (HOSPITAL_COMMUNITY): Payer: 59 | Attending: Hematology & Oncology

## 2014-06-12 ENCOUNTER — Encounter: Payer: Self-pay | Admitting: *Deleted

## 2014-06-12 ENCOUNTER — Encounter (HOSPITAL_COMMUNITY): Payer: Self-pay | Admitting: Lab

## 2014-06-12 VITALS — BP 123/71 | HR 72 | Temp 98.1°F | Wt 246.7 lb

## 2014-06-12 DIAGNOSIS — C50919 Malignant neoplasm of unspecified site of unspecified female breast: Secondary | ICD-10-CM

## 2014-06-12 DIAGNOSIS — C7951 Secondary malignant neoplasm of bone: Secondary | ICD-10-CM | POA: Diagnosis not present

## 2014-06-12 DIAGNOSIS — C50812 Malignant neoplasm of overlapping sites of left female breast: Secondary | ICD-10-CM

## 2014-06-12 DIAGNOSIS — C801 Malignant (primary) neoplasm, unspecified: Secondary | ICD-10-CM

## 2014-06-12 DIAGNOSIS — C787 Secondary malignant neoplasm of liver and intrahepatic bile duct: Secondary | ICD-10-CM

## 2014-06-12 DIAGNOSIS — R109 Unspecified abdominal pain: Secondary | ICD-10-CM

## 2014-06-12 DIAGNOSIS — Z5112 Encounter for antineoplastic immunotherapy: Secondary | ICD-10-CM

## 2014-06-12 DIAGNOSIS — C229 Malignant neoplasm of liver, not specified as primary or secondary: Secondary | ICD-10-CM | POA: Diagnosis present

## 2014-06-12 DIAGNOSIS — Z170 Estrogen receptor positive status [ER+]: Secondary | ICD-10-CM

## 2014-06-12 DIAGNOSIS — Z5111 Encounter for antineoplastic chemotherapy: Secondary | ICD-10-CM

## 2014-06-12 LAB — CBC WITH DIFFERENTIAL/PLATELET
BASOS PCT: 0 % (ref 0–1)
Basophils Absolute: 0 10*3/uL (ref 0.0–0.1)
EOS PCT: 0 % (ref 0–5)
Eosinophils Absolute: 0 10*3/uL (ref 0.0–0.7)
HCT: 37.2 % (ref 36.0–46.0)
Hemoglobin: 12 g/dL (ref 12.0–15.0)
Lymphocytes Relative: 8 % — ABNORMAL LOW (ref 12–46)
Lymphs Abs: 1.3 10*3/uL (ref 0.7–4.0)
MCH: 30.6 pg (ref 26.0–34.0)
MCHC: 32.3 g/dL (ref 30.0–36.0)
MCV: 94.9 fL (ref 78.0–100.0)
MONOS PCT: 9 % (ref 3–12)
Monocytes Absolute: 1.5 10*3/uL — ABNORMAL HIGH (ref 0.1–1.0)
Neutro Abs: 13.7 10*3/uL — ABNORMAL HIGH (ref 1.7–7.7)
Neutrophils Relative %: 83 % — ABNORMAL HIGH (ref 43–77)
PLATELETS: 246 10*3/uL (ref 150–400)
RBC: 3.92 MIL/uL (ref 3.87–5.11)
RDW: 16 % — ABNORMAL HIGH (ref 11.5–15.5)
WBC: 16.5 10*3/uL — AB (ref 4.0–10.5)

## 2014-06-12 LAB — COMPREHENSIVE METABOLIC PANEL
ALT: 39 U/L — AB (ref 0–35)
ANION GAP: 6 (ref 5–15)
AST: 42 U/L — AB (ref 0–37)
Albumin: 3.3 g/dL — ABNORMAL LOW (ref 3.5–5.2)
Alkaline Phosphatase: 216 U/L — ABNORMAL HIGH (ref 39–117)
BUN: 16 mg/dL (ref 6–23)
CO2: 30 mmol/L (ref 19–32)
CREATININE: 0.65 mg/dL (ref 0.50–1.10)
Calcium: 9.3 mg/dL (ref 8.4–10.5)
Chloride: 104 mmol/L (ref 96–112)
GFR calc Af Amer: >90 mL/min (ref >90)
GFR calc non Af Amer: >90 mL/min (ref >90)
GLUCOSE: 116 mg/dL — AB (ref 70–99)
POTASSIUM: 4.2 mmol/L (ref 3.5–5.1)
SODIUM: 140 mmol/L (ref 135–145)
Total Bilirubin: 0.3 mg/dL (ref 0.3–1.2)
Total Protein: 6.7 g/dL (ref 6.0–8.3)

## 2014-06-12 LAB — URINALYSIS, ROUTINE W REFLEX MICROSCOPIC
BILIRUBIN URINE: NEGATIVE
Glucose, UA: NEGATIVE mg/dL
HGB URINE DIPSTICK: NEGATIVE
Ketones, ur: NEGATIVE mg/dL
Leukocytes, UA: NEGATIVE
NITRITE: NEGATIVE
PH: 6.5 (ref 5.0–8.0)
Protein, ur: NEGATIVE mg/dL
Specific Gravity, Urine: 1.01 (ref 1.005–1.030)
Urobilinogen, UA: 0.2 mg/dL (ref 0.0–1.0)

## 2014-06-12 MED ORDER — SODIUM CHLORIDE 0.9 % IV SOLN
Freq: Once | INTRAVENOUS | Status: AC
  Administered 2014-06-12: 8 mg via INTRAVENOUS
  Filled 2014-06-12: qty 4

## 2014-06-12 MED ORDER — SODIUM CHLORIDE 0.9 % IV SOLN
420.0000 mg | Freq: Once | INTRAVENOUS | Status: AC
  Administered 2014-06-12: 420 mg via INTRAVENOUS
  Filled 2014-06-12: qty 14

## 2014-06-12 MED ORDER — SODIUM CHLORIDE 0.9 % IV SOLN: 8.0000 mg | Freq: Once | INTRAVENOUS | Status: DC

## 2014-06-12 MED ORDER — DIPHENHYDRAMINE HCL 25 MG PO CAPS
50.0000 mg | ORAL_CAPSULE | Freq: Once | ORAL | Status: AC
  Administered 2014-06-12: 50 mg via ORAL

## 2014-06-12 MED ORDER — SODIUM CHLORIDE 0.9 % IV SOLN
6.0000 mg/kg | Freq: Once | INTRAVENOUS | Status: AC
  Administered 2014-06-12: 672 mg via INTRAVENOUS
  Filled 2014-06-12: qty 32

## 2014-06-12 MED ORDER — ACETAMINOPHEN 325 MG PO TABS
650.0000 mg | ORAL_TABLET | Freq: Once | ORAL | Status: AC
  Administered 2014-06-12: 650 mg via ORAL

## 2014-06-12 MED ORDER — SERTRALINE HCL 100 MG PO TABS: 200.0000 mg | ORAL_TABLET | Freq: Every day | ORAL | Status: DC

## 2014-06-12 MED ORDER — DEXAMETHASONE 4 MG PO TABS: ORAL_TABLET | ORAL | Status: DC

## 2014-06-12 MED ORDER — DEXAMETHASONE SODIUM PHOSPHATE 10 MG/ML IJ SOLN: 10.0000 mg | Freq: Once | INTRAMUSCULAR | Status: DC

## 2014-06-12 MED ORDER — DOCETAXEL CHEMO INJECTION 160 MG/16ML
75.0000 mg/m2 | Freq: Once | INTRAVENOUS | Status: AC
  Administered 2014-06-12: 170 mg via INTRAVENOUS
  Filled 2014-06-12: qty 17

## 2014-06-12 MED ORDER — SODIUM CHLORIDE 0.9 % IV SOLN
Freq: Once | INTRAVENOUS | Status: AC
  Administered 2014-06-12: 10:00:00 via INTRAVENOUS

## 2014-06-12 MED ORDER — HEPARIN SOD (PORK) LOCK FLUSH 100 UNIT/ML IV SOLN
500.0000 [IU] | Freq: Once | INTRAVENOUS | Status: AC | PRN
  Administered 2014-06-12: 500 [IU]
  Filled 2014-06-12: qty 5

## 2014-06-12 MED ORDER — ACETAMINOPHEN 325 MG PO TABS
ORAL_TABLET | ORAL | Status: AC
  Filled 2014-06-12: qty 2

## 2014-06-12 MED ORDER — DIPHENHYDRAMINE HCL 25 MG PO CAPS
ORAL_CAPSULE | ORAL | Status: AC
  Filled 2014-06-12: qty 2

## 2014-06-12 MED ORDER — SODIUM CHLORIDE 0.9 % IJ SOLN
10.0000 mL | INTRAMUSCULAR | Status: DC | PRN
  Administered 2014-06-12: 10 mL
  Filled 2014-06-12: qty 10

## 2014-06-12 MED ORDER — ZOLPIDEM TARTRATE 10 MG PO TABS: 10.0000 mg | ORAL_TABLET | Freq: Every evening | ORAL | Status: DC | PRN

## 2014-06-12 NOTE — Patient Instructions (Signed)
[facility] Discharge Instructions  RECOMMENDATIONS MADE BY THE CONSULTANT AND ANY TEST RESULTS WILL BE SENT TO YOUR REFERRING PHYSICIAN.  SPECIAL INSTRUCTIONS/FOLLOW-UP:  Please call prior to your next visit with any problems or concerns. Remember to come in or call next week with any problems. We will see you back again in three with labs and an office visit. You will also receive chemotherapy  We have refilled the following prescriptions: ZOLOFT, DEXAMETHASONE, AMBIEN    Thank you for choosing [facility] to provide your oncology and hematology care.  To afford each patient quality time with our providers, please arrive at least 15 minutes before your scheduled appointment time.  With your help, our goal is to use those 15 minutes to complete the necessary work-up to ensure our physicians have the information they need to help with your evaluation and healthcare recommendations.    Effective January 1st, 2014, we ask that you re-schedule your appointment with our physicians should you arrive 10 or more minutes late for your appointment.  We strive to give you quality time with our providers, and arriving late affects you and other patients whose appointments are after yours.    Again, thank you for choosing [facility].  Our hope is that these requests will decrease the amount of time that you wait before being seen by our physicians.       _____________________________________________________________  Should you have questions after your visit to [facility], please contact our office at [phone] between the hours of 8:30 a.m. and 5:00 p.m.  Voicemails left after 4:30 p.m. will not be returned until the following business day.  For prescription refill requests, have your pharmacy contact our office with your prescription refill request.     [place] TO REPORT AS  SOON AS POSSIBLE AFTER TREATMENT:  FEVER GREATER THAN 100.5 F  CHILLS WITH OR WITHOUT FEVER  NAUSEA AND VOMITING THAT IS NOT CONTROLLED WITH YOUR NAUSEA MEDICATION  UNUSUAL SHORTNESS OF BREATH  UNUSUAL BRUISING OR BLEEDING  TENDERNESS IN MOUTH AND THROAT WITH OR WITHOUT PRESENCE OF ULCERS  URINARY PROBLEMS  BOWEL PROBLEMS  UNUSUAL RASH    Wear comfortable clothing and clothing appropriate for easy access to any Portacath or PICC line. Let us know if there is anything that we can do to make your therapy better!

## 2014-06-12 NOTE — Progress Notes (Signed)
Allison Labrum, MD [place] [address]  Stage IV adenocarcinoma with metastases to liver and bone  Had last mammogram in [place] this year, Dr. Gaetano [name]  Colonoscopy 05/18/2013  Breast Reduction 03/17/2011  CURRENT THERAPY:  INTERVAL HISTORY: [name] 49 y.o. female returns for follow-up of stage IV adenocarcinoma. Her primary site it felt to be breast.  Her abdominal pain is finally markedly improved. She notes her extended release morphine controls her pain and she has not needed any breathrough medication. She received herceptin/perjeta and is on Tamoxifen.  Because of her significant pain we opted to add Taxotere; she continues to complain of significant back pain. Overall she is much improved.  She continues to have family issues with her mother and sister. She is working through those. She and her daughter have reconciled and are doing well.   MEDICAL HISTORY: Past Medical History  Diagnosis Date  . PONV (postoperative nausea and vomiting)     pt states scope patch does well  . Anemia   . Depression   . Anxiety   . Headache(784.0)     has migraines - medication controls  . Breast cancer 04/2014    Presumed dx; stage 4 w/ mets to bone and liver  . Bony metastasis     has Hypertrophy of breast; Chest pain, rule out acute myocardial infarction; Hyperlipidemia; Depression; Hypokalemia; Elevated LFTs; Liver lesion; Intractable abdominal pain; Breast cancer, stage 4; Metastatic adenocarcinoma; Bone metastases; Epigastric abdominal pain; Breast cancer; Fever; Pancytopenia; Neutropenic fever; Bony metastasis; and Mucositis due to chemotherapy on her problem list.      Breast cancer, stage 4   04/25/2014 Initial Diagnosis Breast cancer, stage 4   04/25/2014 Imaging CT abdomen/pelvia with widespread metastatic disease to the liver, multiple lytic lesions throughout spine and pelvis. No FX or epidural tumor identified   04/26/2014 Imaging CT head  unremarkable   04/26/2014 Imaging CT chest with no lung mass or pulmonary nodules, no adenopathy. Lytic bone lesions, right 2nd rib   04/27/2014 Initial Biopsy U/S guided liver biopsy, lesion in anterior and inferior left hepatic lobe biopsied   04/27/2014 Pathology Results Metastatic adenocarcinoma, CK7, ER+, patchy positivity with PR. Possible primary includes breast, less likely gynecologic   05/15/2014 Mammogram BI-RADS CATEGORY  2: Benign Finding(s)   05/16/2014 PET scan 1. Intensely hypermetabolic hepatic metastasis. 2. Widespread hypermetabolic skeletal lesions. 3. No primary adenocarcinoma identified by FDG PET imaging.   05/19/2014 Imaging MUGA- Left ventricular ejection fracture greater than 70%.   05/21/2014 Breast MRI No suspicious masses or enhancement within the breasts. No axillary adenopathy.   05/22/2014 -  Antibody Plan Herceptin/Perjeta/Tamoxifen     has No Known Allergies.  [name] had no medications administered during this visit.  SURGICAL HISTORY: Past Surgical History  Procedure Laterality Date  . Right rotator cuff      2002  . Neck fusion      2003  . Laparoscopic cholecystectomy      2004  . Right knee arthroscopy      2005  . Colon surgery      2008  . Appendectomy      2008  . Abdominal hysterectomy    . Breast reduction surgery  03/17/2011    Procedure: MAMMARY REDUCTION BILATERAL (BREAST);  Surgeon: [name];  Location: [facility];  Service: Plastics;  Laterality: Bilateral;  . Colonoscopy N/A 07/13/2013    Procedure: COLONOSCOPY;  Surgeon: Allison Houston, MD;  [facility];  Service: Endoscopy;  Laterality: N/A;  930  . Liver biopsy  04/2014  . Portacath placement    . Esophagogastroduodenoscopy N/A 05/25/2014    Procedure: ESOPHAGOGASTRODUODENOSCOPY (EGD);  Surgeon: Allison Houston, MD;  Location: [facility];  Service: Endoscopy;  Laterality: N/A;  155    SOCIAL HISTORY: History   Social History  . Marital  Status: Married    Spouse Name: N/A  . Number of Children: N/A  . Years of Education: N/A   Occupational History  . Not on file.   Social History Main Topics  . Smoking status: Former Smoker    Types: Cigarettes  . Smokeless tobacco: Never Used  . Alcohol Use: Yes     Comment: Occasionally  . Drug Use: No  . Sexual Activity: Yes    Birth Control/ Protection: Surgical   Other Topics Concern  . Not on file   Social History Narrative    FAMILY HISTORY: Family History  Problem Relation Age of Onset  . Colon cancer Neg Hx   . Diabetes Father   . Heart attack Maternal Grandmother 30    multiple over lifetime.    Review of Systems  Constitutional: Positive for malaise/fatigue.  HENT: Negative.   Eyes: Negative.   Respiratory: Negative.   Cardiovascular: Negative.   Gastrointestinal: Negative.   Genitourinary: Negative.   Musculoskeletal: Positive for back pain.  Skin: Negative.   Neurological: Negative.   Endo/Heme/Allergies: Negative.   Psychiatric/Behavioral: Negative.     PHYSICAL EXAMINATION  ECOG PERFORMANCE STATUS: 1 - Symptomatic but completely ambulatory  Filed Vitals:   06/12/14 0913  BP: 123/71  Pulse: 72  Temp: 98.1 F (36.7 C)    Physical Exam  Constitutional: She is oriented to person, place, and time and well-developed, well-nourished, and in no distress.  HENT:  Head: Normocephalic and atraumatic.  Nose: Nose normal.  Mouth/Throat: Oropharynx is clear and moist. No oropharyngeal exudate.  Eyes: Conjunctivae and EOM are normal. Pupils are equal, round, and reactive to light. Right eye exhibits no discharge. Left eye exhibits no discharge. No scleral icterus.  Neck: Normal range of motion. Neck supple. No tracheal deviation present. No thyromegaly present.  Cardiovascular: Normal rate, regular rhythm and normal heart sounds.  Exam reveals no gallop and no friction rub.   No murmur heard. Pulmonary/Chest: Effort normal and breath sounds  normal. She has no wheezes. She has no rales.  Abdominal: Soft. Bowel sounds are normal. She exhibits no distension and no mass. There is no tenderness. There is no rebound and no guarding.  Musculoskeletal: Normal range of motion. She exhibits no edema.  Lymphadenopathy:    She has no cervical adenopathy.  Neurological: She is alert and oriented to person, place, and time. She has normal reflexes. No cranial nerve deficit. Gait normal. Coordination normal.  Skin: Skin is warm and dry. No rash noted.  Psychiatric: Mood, memory, affect and judgment normal.  Nursing note and vitals reviewed.   LABORATORY DATA:  CBC    Component Value Date/Time   WBC 24.3* 06/19/2014 0534   RBC 3.44* 06/19/2014 0534   HGB 10.3* 06/19/2014 0534   HCT 32.6* 06/19/2014 0534   PLT 182 06/19/2014 0534   MCV 94.8 06/19/2014 0534   MCH 29.9 06/19/2014 0534   MCHC 31.6 06/19/2014 0534   RDW 16.8* 06/19/2014 0534   LYMPHSABS 2.3 06/18/2014 0411   MONOABS 1.3* 06/18/2014 0411   EOSABS 0.1 06/18/2014 0411   BASOSABS 0.3* 06/18/2014  0411   CMP     Component Value Date/Time   NA 141 06/19/2014 0534   K 3.8 06/19/2014 0534   CL 109 06/19/2014 0534   CO2 25 06/19/2014 0534   GLUCOSE 95 06/19/2014 0534   BUN 7 06/19/2014 0534   CREATININE 0.68 06/19/2014 0534   CALCIUM 7.9* 06/19/2014 0534   PROT 6.0 06/18/2014 0411   ALBUMIN 2.8* 06/18/2014 0411   AST 27 06/18/2014 0411   ALT 25 06/18/2014 0411   ALKPHOS 152* 06/18/2014 0411   BILITOT 0.6 06/18/2014 0411   GFRNONAA >90 06/19/2014 0534   GFRAA >90 06/19/2014 0534      ASSESSMENT and THERAPY PLAN:    Breast cancer, stage 4 Pleasant 49 year old pre-menopausal female who presented with widespread metastases to liver and bone. Pathology was ER positive, HER-2 positive and consistent with breast primary. No disease has been found within the breast or either axilla. She does have a history of a reduction several years ago.  We initially started her  on tamoxifen, Herceptin, and Perjeta with the hopes we would achieve reasonable disease control. She however continues to have pain and we have now opted to add in Taxotere. I am not going to discontinue her tamoxifen and we will leave her on this at this point. I am going to refer her to Dr. Fatima Sanger for consideration of radiation to the spine as back pain continues to be a major issue for her. Her abdominal pain is significantly improved. We will continue to get her off of her dexamethasone except as premeds. We will plan on seeing her back again in 3 weeks with her next cycle of therapy. I again reviewed side effects and symptoms of concern. We discussed fever in the setting of chemotherapy in detail. She knows to call with interim problems or concerns.     All questions were answered. The patient knows to call the clinic with any problems, questions or concerns. We can certainly see the patient much sooner if necessary.   Molli Hazard 06/26/2014

## 2014-06-12 NOTE — Progress Notes (Signed)
Referral made to [facility] / Dr Pablo Ledger.  Records faxed on 2/2.

## 2014-06-12 NOTE — Patient Instructions (Signed)
[facility] Discharge Instructions for Patients Receiving Chemotherapy  Today you received the following chemotherapy agents Taxotere, Herceptin, and Perjeta.   If you develop nausea and vomiting, or diarrhea that is not controlled by your medication, call the clinic.  The clinic phone number is [phone]. Office hours are Monday-Friday 8:30am-5:00pm.  BELOW ARE SYMPTOMS THAT SHOULD BE REPORTED IMMEDIATELY:  *FEVER GREATER THAN 101.0 F  *CHILLS WITH OR WITHOUT FEVER  NAUSEA AND VOMITING THAT IS NOT CONTROLLED WITH YOUR NAUSEA MEDICATION  *UNUSUAL SHORTNESS OF BREATH  *UNUSUAL BRUISING OR BLEEDING  TENDERNESS IN MOUTH AND THROAT WITH OR WITHOUT PRESENCE OF ULCERS  *URINARY PROBLEMS  *BOWEL PROBLEMS  UNUSUAL RASH Items with * indicate a potential emergency and should be followed up as soon as possible. If you have an emergency after office hours please contact your primary care physician or go to the nearest emergency department.  Please call the clinic during office hours if you have any questions or concerns.   You may also contact the Patient Navigator at [phone] should you have any questions or need assistance in obtaining follow up care. _____________________________________________________________________ Have you asked about our STAR program?    STAR stands for Survivorship Training and Rehabilitation, and this is a nationally recognized cancer care program that focuses on survivorship and rehabilitation.  Cancer and cancer treatments may cause problems, such as, pain, making you feel tired and keeping you from doing the things that you need or want to do. Cancer rehabilitation can help. Our goal is to reduce these troubling effects and help you have the best quality of life possible.  You may receive a survey from a nurse that asks questions about your current state of health.  Based on the survey results, all eligible patients will be referred to  the [facility] program for an evaluation so we can better serve you! A frequently asked questions sheet is available upon request.          Docetaxel injection What is this medicine? DOCETAXEL (doe se TAX el) is a chemotherapy drug. It targets fast dividing cells, like cancer cells, and causes these cells to die. This medicine is used to treat many types of cancers like breast cancer, certain stomach cancers, head and neck cancer, lung cancer, and prostate cancer. This medicine may be used for other purposes; ask your health care provider or pharmacist if you have questions. COMMON BRAND NAME(S): Docefrez, Taxotere What should I tell my health care provider before I take this medicine? They need to know if you have any of these conditions: -infection (especially a virus infection such as chickenpox, cold sores, or herpes) -liver disease -low blood counts, like low white cell, platelet, or red cell counts -an unusual or allergic reaction to docetaxel, polysorbate 80, other chemotherapy agents, other medicines, foods, dyes, or preservatives -pregnant or trying to get pregnant -breast-feeding How should I use this medicine? This drug is given as an infusion into a vein. It is administered in a hospital or clinic by a specially trained health care professional. Talk to your pediatrician regarding the use of this medicine in children. Special care may be needed. Overdosage: If you think you have taken too much of this medicine contact a poison control center or emergency room at once. NOTE: This medicine is only for you. Do not share this medicine with others. What if I miss a dose? It is important not to miss your dose. Call your doctor or health care professional  if you are unable to keep an appointment. What may interact with this medicine? -cyclosporine -erythromycin -ketoconazole -medicines to increase blood counts like filgrastim, pegfilgrastim, sargramostim -vaccines Talk to your  doctor or health care professional before taking any of these medicines: -acetaminophen -aspirin -ibuprofen -ketoprofen -naproxen This list may not describe all possible interactions. Give your health care provider a list of all the medicines, herbs, non-prescription drugs, or dietary supplements you use. Also tell them if you smoke, drink alcohol, or use illegal drugs. Some items may interact with your medicine. What should I watch for while using this medicine? Your condition will be monitored carefully while you are receiving this medicine. You will need important blood work done while you are taking this medicine. This drug may make you feel generally unwell. This is not uncommon, as chemotherapy can affect healthy cells as well as cancer cells. Report any side effects. Continue your course of treatment even though you feel ill unless your doctor tells you to stop. In some cases, you may be given additional medicines to help with side effects. Follow all directions for their use. Call your doctor or health care professional for advice if you get a fever, chills or sore throat, or other symptoms of a cold or flu. Do not treat yourself. This drug decreases your body's ability to fight infections. Try to avoid being around people who are sick. This medicine may increase your risk to bruise or bleed. Call your doctor or health care professional if you notice any unusual bleeding. Be careful brushing and flossing your teeth or using a toothpick because you may get an infection or bleed more easily. If you have any dental work done, tell your dentist you are receiving this medicine. Avoid taking products that contain aspirin, acetaminophen, ibuprofen, naproxen, or ketoprofen unless instructed by your doctor. These medicines may hide a fever. This medicine contains an alcohol in the product. You may get drowsy or dizzy. Do not drive, use machinery, or do anything that needs mental alertness until you  know how this medicine affects you. Do not stand or sit up quickly, especially if you are an older patient. This reduces the risk of dizzy or fainting spells. Avoid alcoholic drinks Do not become pregnant while taking this medicine. Women should inform their doctor if they wish to become pregnant or think they might be pregnant. There is a potential for serious side effects to an unborn child. Talk to your health care professional or pharmacist for more information. Do not breast-feed an infant while taking this medicine. What side effects may I notice from receiving this medicine? Side effects that you should report to your doctor or health care professional as soon as possible: -allergic reactions like skin rash, itching or hives, swelling of the face, lips, or tongue -low blood counts - This drug may decrease the number of white blood cells, red blood cells and platelets. You may be at increased risk for infections and bleeding. -signs of infection - fever or chills, cough, sore throat, pain or difficulty passing urine -signs of decreased platelets or bleeding - bruising, pinpoint red spots on the skin, black, tarry stools, nosebleeds -signs of decreased red blood cells - unusually weak or tired, fainting spells, lightheadedness -breathing problems -fast or irregular heartbeat -low blood pressure -mouth sores -nausea and vomiting -pain, swelling, redness or irritation at the injection site -pain, tingling, numbness in the hands or feet -swelling of the ankle, feet, hands -weight gain Side effects that usually do  not require medical attention (report to your prescriber or health care professional if they continue or are bothersome): -bone pain -complete hair loss including hair on your head, underarms, pubic hair, eyebrows, and eyelashes -diarrhea -excessive tearing -changes in the color of fingernails -loosening of the fingernails -nausea -muscle pain -red flush to  skin -sweating -weak or tired This list may not describe all possible side effects. Call your doctor for medical advice about side effects. You may report side effects to FDA at 1-800-FDA-1088. Where should I keep my medicine? This drug is given in a hospital or clinic and will not be stored at home. NOTE: This sheet is a summary. It may not cover all possible information. If you have questions about this medicine, talk to your doctor, pharmacist, or health care provider.  2015, [employer]vier/[employer] Standard. (2013-03-23 22:21:02)

## 2014-06-13 LAB — CANCER ANTIGEN 27.29: CA 27.29: 6819.3 U/mL — ABNORMAL HIGH (ref 0.0–38.6)

## 2014-06-13 NOTE — Progress Notes (Signed)
[name]  Clinical Social [name] was referred by [facility] rounding and RN referral for assessment of psychosocial needs due to family dynamic concerns.  Clinical Social Worker met with patient and her father at [facility] to offer support and assess for needs.  Pt shared some family members had been causing her stress due to her new cancer diagnosis. CSW and pt discussed in detail family dynamics and ways to reduce stress. CSW also discussed ways to ask for "what she needs" while going through treatment. Pt open to [employer] (breast cancer mentor) and CSW to make referral. Pt also plans to attend Breast Cancer Support Group in the next week or so. Pt appreaed less stressed at the end of our discussion. CSW to follow for continued support.   Clinical Social [name] interventions: Supportive listening Resource education and referral   [name], [place] Tuesdays 8:30-1pm Wednesdays 8:30-12pm  [phone]

## 2014-06-14 ENCOUNTER — Telehealth (HOSPITAL_COMMUNITY): Payer: Self-pay | Admitting: *Deleted

## 2014-06-14 ENCOUNTER — Encounter (HOSPITAL_BASED_OUTPATIENT_CLINIC_OR_DEPARTMENT_OTHER): Payer: 59

## 2014-06-14 ENCOUNTER — Encounter (HOSPITAL_COMMUNITY): Payer: Self-pay

## 2014-06-14 DIAGNOSIS — Z5189 Encounter for other specified aftercare: Secondary | ICD-10-CM

## 2014-06-14 DIAGNOSIS — C787 Secondary malignant neoplasm of liver and intrahepatic bile duct: Secondary | ICD-10-CM

## 2014-06-14 DIAGNOSIS — C50919 Malignant neoplasm of unspecified site of unspecified female breast: Secondary | ICD-10-CM

## 2014-06-14 DIAGNOSIS — C801 Malignant (primary) neoplasm, unspecified: Secondary | ICD-10-CM

## 2014-06-14 DIAGNOSIS — C7951 Secondary malignant neoplasm of bone: Secondary | ICD-10-CM

## 2014-06-14 MED ORDER — PEGFILGRASTIM INJECTION 6 MG/0.6ML ~~LOC~~
PREFILLED_SYRINGE | SUBCUTANEOUS | Status: AC
  Filled 2014-06-14: qty 0.6

## 2014-06-14 MED ORDER — PEGFILGRASTIM INJECTION 6 MG/0.6ML ~~LOC~~
6.0000 mg | PREFILLED_SYRINGE | Freq: Once | SUBCUTANEOUS | Status: AC
  Administered 2014-06-14: 6 mg via SUBCUTANEOUS

## 2014-06-14 NOTE — Patient Instructions (Signed)
[place] at [facility] Discharge Instructions  RECOMMENDATIONS MADE BY THE CONSULTANT AND ANY TEST RESULTS WILL BE SENT TO YOUR REFERRING PHYSICIAN.  Neulasta - this medication is not chemo but being given because you have had chemo. It is usually given 24-72 hours after the completion of chemotherapy. This medication works by boosting your bone marrow's supply of white blood cells. White blood cells are what protect our bodies against infection. The medication is given in the form of a subcutaneous injection. It is given in the fatty tissue of your abdomen. It is a short needle. The major side effect of this medication is bone or muscle pain. Take your pain medication as ordered. The level of pain you experience as a result of this injection can range from none, to mild or moderate, or severe. Please let us know if you develop moderate or severe bone pain.  You may take a Claritin (loratidine) 10mg  tablet the day before, the day of, and for several days after the neulasta injection) ---this may lessen severity of pain. Pain can start several hours to several days after the injection. Some people complain of flu like symptoms (feeling achy, tired, run down) after the injection. Please let us know what symptoms you experience. You may apply a heating pad to the areas that hurt.     Thank you for choosing [place] at [facility] to provide your oncology and hematology care.  To afford each patient quality time with our provider, please arrive at least 15 minutes before your scheduled appointment time.    You need to re-schedule your appointment should you arrive 10 or more minutes late.  We strive to give you quality time with our providers, and arriving late affects you and other patients whose appointments are after yours.  Also, if you no show three or more times for appointments you may be dismissed from the clinic at the providers discretion.      Again, thank you for choosing [facility]  Our hope is that these requests will decrease the amount of time that you wait before being seen by our physicians.       _____________________________________________________________  Should you have questions after your visit to [facility], please contact our office at [phone] between the hours of 8:30 a.m. and 4:30 p.m.  Voicemails left after 4:30 p.m. will not be returned until the following business day.  For prescription refill requests, have your pharmacy contact our office.    Pegfilgrastim injection What is this medicine? PEGFILGRASTIM (peg fil GRA stim) is a long-acting granulocyte colony-stimulating factor that stimulates the growth of neutrophils, a type of white blood cell important in the body's fight against infection. It is used to reduce the incidence of fever and infection in patients with certain types of cancer who are receiving chemotherapy that affects the bone marrow. This medicine may be used for other purposes; ask your health care provider or pharmacist if you have questions. COMMON BRAND NAME(S): Neulasta What should I tell my health care provider before I take this medicine? They need to know if you have any of these conditions: -latex allergy -ongoing radiation therapy -sickle cell disease -skin reactions to acrylic adhesives (On-Body Injector only) -an unusual or allergic reaction to pegfilgrastim, filgrastim, other medicines, foods, dyes, or preservatives -pregnant or trying to get pregnant -breast-feeding How should I use this medicine? This medicine is for injection under the skin. If  you get this medicine at home, you will be taught how to prepare and give the pre-filled syringe or how to use the On-body Injector. Refer to the patient Instructions for Use for detailed instructions. Use exactly as directed. Take your medicine at regular intervals. Do not take your medicine more often  than directed. It is important that you put your used needles and syringes in a special sharps container. Do not put them in a trash can. If you do not have a sharps container, call your pharmacist or healthcare provider to get one. Talk to your pediatrician regarding the use of this medicine in children. Special care may be needed. Overdosage: If you think you have taken too much of this medicine contact a poison control center or emergency room at once. NOTE: This medicine is only for you. Do not share this medicine with others. What if I miss a dose? It is important not to miss your dose. Call your doctor or health care professional if you miss your dose. If you miss a dose due to an On-body Injector failure or leakage, a new dose should be administered as soon as possible using a single prefilled syringe for manual use. What may interact with this medicine? Interactions have not been studied. Give your health care provider a list of all the medicines, herbs, non-prescription drugs, or dietary supplements you use. Also tell them if you smoke, drink alcohol, or use illegal drugs. Some items may interact with your medicine. This list may not describe all possible interactions. Give your health care provider a list of all the medicines, herbs, non-prescription drugs, or dietary supplements you use. Also tell them if you smoke, drink alcohol, or use illegal drugs. Some items may interact with your medicine. What should I watch for while using this medicine? You may need blood work done while you are taking this medicine. If you are going to need a MRI, CT scan, or other procedure, tell your doctor that you are using this medicine (On-Body Injector only). What side effects may I notice from receiving this medicine? Side effects that you should report to your doctor or health care professional as soon as possible: -allergic reactions like skin rash, itching or hives, swelling of the face, lips, or  tongue -dizziness -fever -pain, redness, or irritation at site where injected -pinpoint red spots on the skin -shortness of breath or breathing problems -stomach or side pain, or pain at the shoulder -swelling -tiredness -trouble passing urine Side effects that usually do not require medical attention (report to your doctor or health care professional if they continue or are bothersome): -bone pain -muscle pain This list may not describe all possible side effects. Call your doctor for medical advice about side effects. You may report side effects to FDA at 1-800-FDA-1088. Where should I keep my medicine? Keep out of the reach of children. Store pre-filled syringes in a refrigerator between 2 and 8 degrees C (36 and 46 degrees F). Do not freeze. Keep in carton to protect from light. Throw away this medicine if it is left out of the refrigerator for more than 48 hours. Throw away any unused medicine after the expiration date. NOTE: This sheet is a summary. It may not cover all possible information. If you have questions about this medicine, talk to your doctor, pharmacist, or health care provider.  2015, [employer] Standard. (2013-07-27 16:14:05)

## 2014-06-14 NOTE — Progress Notes (Signed)
[name]'s reason for visit today is for an injection as scheduled per MD orders.[name] also received neulasta 6mg  per MD orders; see MAR for administration details.  [name] tolerated all procedures well and without incident; questions were answered and patient was discharged.   24 hour follow up, pt states that she has done well.  She just feels tired.

## 2014-06-17 ENCOUNTER — Inpatient Hospital Stay (HOSPITAL_COMMUNITY)
Admission: EM | Admit: 2014-06-17 | Discharge: 2014-06-19 | DRG: 809 | Disposition: A | Discharge status: Home / Self Care | Payer: 59 | Attending: Internal Medicine | Admitting: Internal Medicine

## 2014-06-17 ENCOUNTER — Encounter (HOSPITAL_COMMUNITY): Payer: Self-pay | Admitting: *Deleted

## 2014-06-17 ENCOUNTER — Emergency Department (HOSPITAL_COMMUNITY): Payer: 59

## 2014-06-17 DIAGNOSIS — K1230 Oral mucositis (ulcerative), unspecified: Secondary | ICD-10-CM | POA: Diagnosis present

## 2014-06-17 DIAGNOSIS — Z9221 Personal history of antineoplastic chemotherapy: Secondary | ICD-10-CM

## 2014-06-17 DIAGNOSIS — D709 Neutropenia, unspecified: Secondary | ICD-10-CM

## 2014-06-17 DIAGNOSIS — D701 Agranulocytosis secondary to cancer chemotherapy: Secondary | ICD-10-CM | POA: Diagnosis not present

## 2014-06-17 DIAGNOSIS — C50919 Malignant neoplasm of unspecified site of unspecified female breast: Secondary | ICD-10-CM | POA: Diagnosis present

## 2014-06-17 DIAGNOSIS — D61818 Other pancytopenia: Secondary | ICD-10-CM | POA: Diagnosis present

## 2014-06-17 DIAGNOSIS — Z981 Arthrodesis status: Secondary | ICD-10-CM

## 2014-06-17 DIAGNOSIS — Z87891 Personal history of nicotine dependence: Secondary | ICD-10-CM

## 2014-06-17 DIAGNOSIS — T451X5A Adverse effect of antineoplastic and immunosuppressive drugs, initial encounter: Secondary | ICD-10-CM | POA: Diagnosis present

## 2014-06-17 DIAGNOSIS — C799 Secondary malignant neoplasm of unspecified site: Secondary | ICD-10-CM

## 2014-06-17 DIAGNOSIS — C7951 Secondary malignant neoplasm of bone: Secondary | ICD-10-CM | POA: Diagnosis present

## 2014-06-17 DIAGNOSIS — G894 Chronic pain syndrome: Secondary | ICD-10-CM | POA: Diagnosis present

## 2014-06-17 DIAGNOSIS — Z8249 Family history of ischemic heart disease and other diseases of the circulatory system: Secondary | ICD-10-CM

## 2014-06-17 DIAGNOSIS — K1231 Oral mucositis (ulcerative) due to antineoplastic therapy: Secondary | ICD-10-CM | POA: Diagnosis present

## 2014-06-17 DIAGNOSIS — Z833 Family history of diabetes mellitus: Secondary | ICD-10-CM

## 2014-06-17 DIAGNOSIS — R509 Fever, unspecified: Secondary | ICD-10-CM | POA: Diagnosis not present

## 2014-06-17 DIAGNOSIS — C787 Secondary malignant neoplasm of liver and intrahepatic bile duct: Secondary | ICD-10-CM | POA: Diagnosis present

## 2014-06-17 DIAGNOSIS — R5081 Fever presenting with conditions classified elsewhere: Secondary | ICD-10-CM | POA: Diagnosis present

## 2014-06-17 MED ORDER — KETOROLAC TROMETHAMINE 30 MG/ML IJ SOLN
15.0000 mg | Freq: Once | INTRAMUSCULAR | Status: AC
  Administered 2014-06-18: 15 mg via INTRAVENOUS
  Filled 2014-06-17: qty 1

## 2014-06-17 MED ORDER — SODIUM CHLORIDE 0.9 % IV BOLUS (SEPSIS)
1000.0000 mL | Freq: Once | INTRAVENOUS | Status: AC
  Administered 2014-06-18: 1000 mL via INTRAVENOUS

## 2014-06-17 MED ORDER — MORPHINE SULFATE 4 MG/ML IJ SOLN
4.0000 mg | Freq: Once | INTRAMUSCULAR | Status: AC
  Administered 2014-06-18: 4 mg via INTRAVENOUS
  Filled 2014-06-17: qty 1

## 2014-06-17 MED ORDER — ONDANSETRON HCL 4 MG/2ML IJ SOLN
4.0000 mg | Freq: Once | INTRAMUSCULAR | Status: AC
  Administered 2014-06-18: 4 mg via INTRAVENOUS
  Filled 2014-06-17: qty 2

## 2014-06-17 NOTE — ED Notes (Signed)
Pt has beast, liver, & bone cancer. Been running fevers, had chemo tx on Tuesday. Pt states she has been having some diarrhea to.

## 2014-06-18 ENCOUNTER — Encounter (HOSPITAL_COMMUNITY): Payer: Self-pay | Admitting: Internal Medicine

## 2014-06-18 DIAGNOSIS — G894 Chronic pain syndrome: Secondary | ICD-10-CM | POA: Diagnosis present

## 2014-06-18 DIAGNOSIS — C50919 Malignant neoplasm of unspecified site of unspecified female breast: Secondary | ICD-10-CM

## 2014-06-18 DIAGNOSIS — C787 Secondary malignant neoplasm of liver and intrahepatic bile duct: Secondary | ICD-10-CM | POA: Diagnosis present

## 2014-06-18 DIAGNOSIS — K1230 Oral mucositis (ulcerative), unspecified: Secondary | ICD-10-CM

## 2014-06-18 DIAGNOSIS — Z87891 Personal history of nicotine dependence: Secondary | ICD-10-CM | POA: Diagnosis not present

## 2014-06-18 DIAGNOSIS — R509 Fever, unspecified: Secondary | ICD-10-CM | POA: Diagnosis present

## 2014-06-18 DIAGNOSIS — D709 Neutropenia, unspecified: Secondary | ICD-10-CM

## 2014-06-18 DIAGNOSIS — Z981 Arthrodesis status: Secondary | ICD-10-CM | POA: Diagnosis not present

## 2014-06-18 DIAGNOSIS — R5081 Fever presenting with conditions classified elsewhere: Secondary | ICD-10-CM | POA: Diagnosis present

## 2014-06-18 DIAGNOSIS — Z833 Family history of diabetes mellitus: Secondary | ICD-10-CM | POA: Diagnosis not present

## 2014-06-18 DIAGNOSIS — D61818 Other pancytopenia: Secondary | ICD-10-CM

## 2014-06-18 DIAGNOSIS — R197 Diarrhea, unspecified: Secondary | ICD-10-CM

## 2014-06-18 DIAGNOSIS — Z8249 Family history of ischemic heart disease and other diseases of the circulatory system: Secondary | ICD-10-CM | POA: Diagnosis not present

## 2014-06-18 DIAGNOSIS — C7951 Secondary malignant neoplasm of bone: Secondary | ICD-10-CM | POA: Diagnosis present

## 2014-06-18 DIAGNOSIS — D701 Agranulocytosis secondary to cancer chemotherapy: Secondary | ICD-10-CM | POA: Diagnosis present

## 2014-06-18 DIAGNOSIS — Z9221 Personal history of antineoplastic chemotherapy: Secondary | ICD-10-CM | POA: Diagnosis not present

## 2014-06-18 DIAGNOSIS — T451X5A Adverse effect of antineoplastic and immunosuppressive drugs, initial encounter: Secondary | ICD-10-CM | POA: Diagnosis present

## 2014-06-18 LAB — CBC WITH DIFFERENTIAL/PLATELET
BASOS PCT: 5 % — AB (ref 0–1)
Band Neutrophils: 0 % (ref 0–10)
Basophils Absolute: 0 10*3/uL (ref 0.0–0.1)
Basophils Absolute: 0.3 10*3/uL — ABNORMAL HIGH (ref 0.0–0.1)
Basophils Relative: 0 % (ref 0–1)
Blasts: 0 %
EOS ABS: 0 10*3/uL (ref 0.0–0.7)
EOS PCT: 1 % (ref 0–5)
EOS PCT: 1 % (ref 0–5)
Eosinophils Absolute: 0.1 10*3/uL (ref 0.0–0.7)
HCT: 35.1 % — ABNORMAL LOW (ref 36.0–46.0)
HCT: 35.6 % — ABNORMAL LOW (ref 36.0–46.0)
HEMOGLOBIN: 11.2 g/dL — AB (ref 12.0–15.0)
Hemoglobin: 11.3 g/dL — ABNORMAL LOW (ref 12.0–15.0)
LYMPHS ABS: 2.3 10*3/uL (ref 0.7–4.0)
Lymphocytes Relative: 48 % — ABNORMAL HIGH (ref 12–46)
Lymphocytes Relative: 56 % — ABNORMAL HIGH (ref 12–46)
Lymphs Abs: 2.1 10*3/uL (ref 0.7–4.0)
MCH: 29.7 pg (ref 26.0–34.0)
MCH: 29.9 pg (ref 26.0–34.0)
MCHC: 31.7 g/dL (ref 30.0–36.0)
MCHC: 31.9 g/dL (ref 30.0–36.0)
MCV: 93.7 fL (ref 78.0–100.0)
MCV: 93.9 fL (ref 78.0–100.0)
MONO ABS: 1.3 10*3/uL — AB (ref 0.1–1.0)
MYELOCYTES: 0 %
Metamyelocytes Relative: 0 %
Monocytes Absolute: 0.9 10*3/uL (ref 0.1–1.0)
Monocytes Relative: 26 % — ABNORMAL HIGH (ref 3–12)
Monocytes Relative: 27 % — ABNORMAL HIGH (ref 3–12)
NEUTROS ABS: 0.6 10*3/uL — AB (ref 1.7–7.7)
NEUTROS ABS: 0.9 10*3/uL — AB (ref 1.7–7.7)
NEUTROS PCT: 19 % — AB (ref 43–77)
Neutrophils Relative %: 17 % — ABNORMAL LOW (ref 43–77)
PLATELETS: 172 10*3/uL (ref 150–400)
PLATELETS: 175 10*3/uL (ref 150–400)
Promyelocytes Absolute: 0 %
RBC: 3.74 MIL/uL — ABNORMAL LOW (ref 3.87–5.11)
RBC: 3.8 MIL/uL — ABNORMAL LOW (ref 3.87–5.11)
RDW: 15.9 % — ABNORMAL HIGH (ref 11.5–15.5)
RDW: 16 % — ABNORMAL HIGH (ref 11.5–15.5)
WBC: 3.6 10*3/uL — AB (ref 4.0–10.5)
WBC: 4.8 10*3/uL (ref 4.0–10.5)
nRBC: 0 /100 WBC

## 2014-06-18 LAB — LACTIC ACID, PLASMA
Lactic Acid, Venous: 1.2 mmol/L (ref 0.5–2.0)
Lactic Acid, Venous: 1.2 mmol/L (ref 0.5–2.0)

## 2014-06-18 LAB — URINALYSIS, ROUTINE W REFLEX MICROSCOPIC
Bilirubin Urine: NEGATIVE
GLUCOSE, UA: NEGATIVE mg/dL
Hgb urine dipstick: NEGATIVE
Ketones, ur: NEGATIVE mg/dL
Leukocytes, UA: NEGATIVE
Nitrite: NEGATIVE
PH: 6.5 (ref 5.0–8.0)
Protein, ur: NEGATIVE mg/dL
Specific Gravity, Urine: <1.005 — ABNORMAL LOW (ref 1.005–1.030)
Urobilinogen, UA: 0.2 mg/dL (ref 0.0–1.0)

## 2014-06-18 LAB — BASIC METABOLIC PANEL
ANION GAP: 2 — AB (ref 5–15)
BUN: 12 mg/dL (ref 6–23)
CHLORIDE: 106 mmol/L (ref 96–112)
CO2: 27 mmol/L (ref 19–32)
Calcium: 8.4 mg/dL (ref 8.4–10.5)
Creatinine, Ser: 0.68 mg/dL (ref 0.50–1.10)
GFR calc Af Amer: >90 mL/min (ref >90)
GFR calc non Af Amer: >90 mL/min (ref >90)
Glucose, Bld: 118 mg/dL — ABNORMAL HIGH (ref 70–99)
Potassium: 3.9 mmol/L (ref 3.5–5.1)
SODIUM: 135 mmol/L (ref 135–145)

## 2014-06-18 LAB — COMPREHENSIVE METABOLIC PANEL
ALT: 25 U/L (ref 0–35)
AST: 27 U/L (ref 0–37)
Albumin: 2.8 g/dL — ABNORMAL LOW (ref 3.5–5.2)
Alkaline Phosphatase: 152 U/L — ABNORMAL HIGH (ref 39–117)
Anion gap: 4 — ABNORMAL LOW (ref 5–15)
BILIRUBIN TOTAL: 0.6 mg/dL (ref 0.3–1.2)
BUN: 13 mg/dL (ref 6–23)
CO2: 26 mmol/L (ref 19–32)
Calcium: 8.1 mg/dL — ABNORMAL LOW (ref 8.4–10.5)
Chloride: 106 mmol/L (ref 96–112)
Creatinine, Ser: 0.68 mg/dL (ref 0.50–1.10)
GFR calc Af Amer: >90 mL/min (ref >90)
Glucose, Bld: 115 mg/dL — ABNORMAL HIGH (ref 70–99)
Potassium: 3.8 mmol/L (ref 3.5–5.1)
SODIUM: 136 mmol/L (ref 135–145)
Total Protein: 6 g/dL (ref 6.0–8.3)

## 2014-06-18 LAB — CLOSTRIDIUM DIFFICILE BY PCR: Toxigenic C. Difficile by PCR: NEGATIVE

## 2014-06-18 MED ORDER — CETYLPYRIDINIUM CHLORIDE 0.05 % MT LIQD
7.0000 mL | Freq: Two times a day (BID) | OROMUCOSAL | Status: DC
  Administered 2014-06-18 – 2014-06-19 (×3): 7 mL via OROMUCOSAL

## 2014-06-18 MED ORDER — DIPHENHYDRAMINE HCL 25 MG PO CAPS: 25.0000 mg | ORAL_CAPSULE | Freq: Four times a day (QID) | ORAL | Status: DC | PRN

## 2014-06-18 MED ORDER — VANCOMYCIN HCL IN DEXTROSE 1-5 GM/200ML-% IV SOLN
1000.0000 mg | Freq: Two times a day (BID) | INTRAVENOUS | Status: DC
  Administered 2014-06-18 – 2014-06-19 (×2): 1000 mg via INTRAVENOUS
  Filled 2014-06-18 (×4): qty 200

## 2014-06-18 MED ORDER — MAGIC MOUTHWASH W/LIDOCAINE: 10.0000 mL | Freq: Four times a day (QID) | ORAL | Status: DC | PRN

## 2014-06-18 MED ORDER — ONDANSETRON HCL 4 MG PO TABS
8.0000 mg | ORAL_TABLET | Freq: Three times a day (TID) | ORAL | Status: DC | PRN
  Administered 2014-06-19: 8 mg via ORAL
  Filled 2014-06-18: qty 2

## 2014-06-18 MED ORDER — POTASSIUM CHLORIDE IN NACL 20-0.9 MEQ/L-% IV SOLN
INTRAVENOUS | Status: DC
  Administered 2014-06-18: 1000 mL via INTRAVENOUS
  Administered 2014-06-19: 01:00:00 via INTRAVENOUS

## 2014-06-18 MED ORDER — POLYETHYLENE GLYCOL 3350 17 G PO PACK: 17.0000 g | PACK | Freq: Every day | ORAL | Status: DC | PRN

## 2014-06-18 MED ORDER — SUMATRIPTAN SUCCINATE 50 MG PO TABS
100.0000 mg | ORAL_TABLET | ORAL | Status: DC | PRN
  Filled 2014-06-18: qty 1

## 2014-06-18 MED ORDER — TAMOXIFEN CITRATE 10 MG PO TABS
20.0000 mg | ORAL_TABLET | Freq: Every day | ORAL | Status: DC
  Administered 2014-06-18 – 2014-06-19 (×2): 20 mg via ORAL
  Filled 2014-06-18 (×2): qty 2

## 2014-06-18 MED ORDER — DEXTROSE 5 % IV SOLN
2.0000 g | Freq: Once | INTRAVENOUS | Status: AC
  Administered 2014-06-18: 2 g via INTRAVENOUS
  Filled 2014-06-18: qty 2

## 2014-06-18 MED ORDER — SALINE SPRAY 0.65 % NA SOLN: 1.0000 | NASAL | Status: DC | PRN

## 2014-06-18 MED ORDER — ALPRAZOLAM 0.5 MG PO TABS: 0.5000 mg | ORAL_TABLET | Freq: Three times a day (TID) | ORAL | Status: DC | PRN

## 2014-06-18 MED ORDER — LIDOCAINE VISCOUS 2 % MT SOLN: 5.0000 mL | Freq: Four times a day (QID) | OROMUCOSAL | Status: DC | PRN

## 2014-06-18 MED ORDER — LIDOCAINE-PRILOCAINE 2.5-2.5 % EX CREA: 1.0000 "application " | TOPICAL_CREAM | Freq: Every day | CUTANEOUS | Status: DC | PRN

## 2014-06-18 MED ORDER — HYDROCODONE-ACETAMINOPHEN 7.5-325 MG PO TABS: 1.0000 | ORAL_TABLET | Freq: Three times a day (TID) | ORAL | Status: DC | PRN

## 2014-06-18 MED ORDER — MAGIC MOUTHWASH: 10.0000 mL | Freq: Four times a day (QID) | ORAL | Status: DC | PRN

## 2014-06-18 MED ORDER — DOCUSATE SODIUM 100 MG PO CAPS
100.0000 mg | ORAL_CAPSULE | Freq: Two times a day (BID) | ORAL | Status: DC
  Administered 2014-06-18 – 2014-06-19 (×3): 100 mg via ORAL
  Filled 2014-06-18 (×3): qty 1

## 2014-06-18 MED ORDER — MORPHINE SULFATE ER 30 MG PO TBCR
30.0000 mg | EXTENDED_RELEASE_TABLET | Freq: Two times a day (BID) | ORAL | Status: DC
  Administered 2014-06-18 – 2014-06-19 (×3): 30 mg via ORAL
  Filled 2014-06-18 (×3): qty 1

## 2014-06-18 MED ORDER — CALCIUM CARBONATE-VITAMIN D 500-200 MG-UNIT PO TABS
1.0000 | ORAL_TABLET | Freq: Two times a day (BID) | ORAL | Status: DC
  Administered 2014-06-18 – 2014-06-19 (×3): 1 via ORAL
  Filled 2014-06-18 (×3): qty 1

## 2014-06-18 MED ORDER — PANTOPRAZOLE SODIUM 40 MG PO TBEC
40.0000 mg | DELAYED_RELEASE_TABLET | Freq: Every day | ORAL | Status: DC
  Administered 2014-06-18 – 2014-06-19 (×2): 40 mg via ORAL
  Filled 2014-06-18 (×2): qty 1

## 2014-06-18 MED ORDER — SODIUM CHLORIDE 0.9 % IV SOLN
INTRAVENOUS | Status: DC
  Administered 2014-06-18: 03:00:00 via INTRAVENOUS

## 2014-06-18 MED ORDER — ZOLPIDEM TARTRATE 5 MG PO TABS
10.0000 mg | ORAL_TABLET | Freq: Every evening | ORAL | Status: DC | PRN
  Administered 2014-06-18: 10 mg via ORAL
  Filled 2014-06-18: qty 2

## 2014-06-18 MED ORDER — CEFEPIME HCL 2 G IJ SOLR
2.0000 g | Freq: Three times a day (TID) | INTRAMUSCULAR | Status: DC
  Administered 2014-06-18 – 2014-06-19 (×4): 2 g via INTRAVENOUS
  Filled 2014-06-18 (×7): qty 2

## 2014-06-18 MED ORDER — CHLORHEXIDINE GLUCONATE 0.12 % MT SOLN
15.0000 mL | Freq: Four times a day (QID) | OROMUCOSAL | Status: DC
  Administered 2014-06-18 – 2014-06-19 (×3): 15 mL via OROMUCOSAL
  Filled 2014-06-18 (×3): qty 15

## 2014-06-18 MED ORDER — SERTRALINE HCL 50 MG PO TABS
200.0000 mg | ORAL_TABLET | Freq: Every day | ORAL | Status: DC
  Administered 2014-06-18 – 2014-06-19 (×2): 200 mg via ORAL
  Filled 2014-06-18 (×2): qty 4

## 2014-06-18 MED ORDER — VANCOMYCIN HCL 10 G IV SOLR
1500.0000 mg | Freq: Once | INTRAVENOUS | Status: AC
  Administered 2014-06-18: 1500 mg via INTRAVENOUS
  Filled 2014-06-18: qty 1500

## 2014-06-18 MED ORDER — KETOROLAC TROMETHAMINE 15 MG/ML IJ SOLN
15.0000 mg | Freq: Four times a day (QID) | INTRAMUSCULAR | Status: DC | PRN
  Administered 2014-06-18 – 2014-06-19 (×4): 15 mg via INTRAVENOUS
  Filled 2014-06-18 (×4): qty 1

## 2014-06-18 MED ORDER — ENOXAPARIN SODIUM 40 MG/0.4ML ~~LOC~~ SOLN
40.0000 mg | SUBCUTANEOUS | Status: DC
  Administered 2014-06-18 – 2014-06-19 (×2): 40 mg via SUBCUTANEOUS
  Filled 2014-06-18 (×2): qty 0.4

## 2014-06-18 MED ORDER — SODIUM CHLORIDE 0.9 % IJ SOLN
3.0000 mL | Freq: Two times a day (BID) | INTRAMUSCULAR | Status: DC
  Administered 2014-06-18: 3 mL via INTRAVENOUS

## 2014-06-18 MED ORDER — MORPHINE SULFATE 4 MG/ML IJ SOLN
5.0000 mg | INTRAMUSCULAR | Status: DC | PRN
  Administered 2014-06-18 – 2014-06-19 (×4): 5 mg via INTRAVENOUS
  Filled 2014-06-18 (×4): qty 2

## 2014-06-18 NOTE — Progress Notes (Signed)
ANTIBIOTIC CONSULT NOTE - INITIAL  Pharmacy Consult for vancomycin Indication: FUO in neutropenic pt  No Known Allergies  Patient Measurements: Height: 5' 8.5" (174 cm) Weight: 249 lb 4.8 oz (113.082 kg) IBW/kg (Calculated) : 65.05 Adjusted Body Weight: 81kg   Vital Signs: Temp: 98.2 F (36.8 C) (02/08 0300) Temp Source: Oral (02/08 0300) BP: 127/62 mmHg (02/08 0300) Pulse Rate: 109 (02/07 2314) Intake/Output from previous day:   Intake/Output from this shift:    Labs:  Recent Labs  06/17/14 2350  WBC 3.6*  HGB 11.3*  PLT 175  CREATININE 0.68   Estimated Creatinine Clearance: 110.7 mL/min (by C-G formula based on Cr of 0.68). No results for input(s): VANCOTROUGH, VANCOPEAK, VANCORANDOM, GENTTROUGH, GENTPEAK, GENTRANDOM, TOBRATROUGH, TOBRAPEAK, TOBRARND, AMIKACINPEAK, AMIKACINTROU, AMIKACIN in the last 72 hours.   Microbiology: Recent Results (from the past 720 hour(s))  Blood culture (routine x 2)     Status: None (Preliminary result)   Collection Time: 06/18/14 12:54 AM  Result Value Ref Range Status   Specimen Description BLOOD RIGHT HAND  Final   Special Requests BOTTLES DRAWN AEROBIC AND ANAEROBIC 6CC  Final   Culture PENDING  Incomplete   Report Status PENDING  Incomplete  Blood culture (routine x 2)     Status: None (Preliminary result)   Collection Time: 06/18/14 12:58 AM  Result Value Ref Range Status   Specimen Description LEFT ANTECUBITAL  Final   Special Requests BOTTLES DRAWN AEROBIC AND ANAEROBIC [facility]  Final   Culture PENDING  Incomplete   Report Status PENDING  Incomplete    Medical History: Past Medical History  Diagnosis Date  . PONV (postoperative nausea and vomiting)     pt states scope patch does well  . Anemia   . Depression   . Anxiety   . Headache(784.0)     has migraines - medication controls  . Breast cancer 04/2014    Medications:  Scheduled:  . calcium-vitamin D  1 tablet Oral BID  . ceFEPime (MAXIPIME) IV  2 g  Intravenous Q8H  . docusate sodium  100 mg Oral BID  . enoxaparin (LOVENOX) injection  40 mg Subcutaneous Q24H  . morphine  30 mg Oral Q12H  . pantoprazole  40 mg Oral Daily  . sertraline  200 mg Oral Daily  . sodium chloride  3 mL Intravenous Q12H  . tamoxifen  20 mg Oral Daily  . vancomycin  1,500 mg Intravenous Once  . vancomycin  1,000 mg Intravenous Q12H   Infusions:  . sodium chloride     PRN: ALPRAZolam, diphenhydrAMINE, HYDROcodone-acetaminophen, lidocaine-prilocaine, ondansetron, polyethylene glycol, SUMAtriptan, zolpidem   Assessment: 60yr obese female with metastatic breast CA with febrile neutopenia.  To be treated with vancomycin & cefepime.  Goal of Therapy:  Desire steady state vancomycin serum trough level to be 15-73mcg/ml  Plan:  1. Vancomycin 1500mg  loading dose x 1 now and then 2. Vancomycin maintenance regimen 1gm IV q12h 3.  Monitor for indices of infection and renal function 4.  Measure actual serum vancomycin trough level as clinically appropriate  Penni Homans 06/18/2014,3:02 AM

## 2014-06-18 NOTE — H&P (Signed)
[name] is an 49 y.o. female.    Pcp:  Dr. Pleas Koch Oncologist:    Chief Complaint: fever HPI: 49 yo female with hx of metastatic breast cancer, c/o fever starting on Friday. 101.9 today.  Pt was told by oncologist to come to ED if fever was over 100.5, and therefore she presented to the ED for evaluation.  Admits to 4-5 loose bm (diarrhea) today.  Pt denies cough, cp, palp, sob, n/v, abd pain, brbpr, black stool, dysuria, hematuria.  Pt will be admitted for w/up of fever.  Past Medical History  Diagnosis Date  . PONV (postoperative nausea and vomiting)     pt states scope patch does well  . Anemia   . Depression   . Anxiety   . Headache(784.0)     has migraines - medication controls  . Breast cancer 04/2014    Past Surgical History  Procedure Laterality Date  . Right rotator cuff      2002  . Neck fusion      2003  . Laparoscopic cholecystectomy      2004  . Right knee arthroscopy      2005  . Colon surgery      2008  . Appendectomy      2008  . Abdominal hysterectomy    . Breast reduction surgery  03/17/2011    Procedure: MAMMARY REDUCTION BILATERAL (BREAST);  Surgeon: Mary A Contogiannis;  Location: [facility];  Service: Plastics;  Laterality: Bilateral;  . Colonoscopy N/A 07/13/2013    Procedure: COLONOSCOPY;  Surgeon: Rogene Houston, MD;  [facility];  Service: Endoscopy;  Laterality: N/A;  930  . Liver biopsy  04/2014  . Portacath placement    . Esophagogastroduodenoscopy N/A 05/25/2014    Procedure: ESOPHAGOGASTRODUODENOSCOPY (EGD);  Surgeon: Rogene Houston, MD;  Location: [facility];  Service: Endoscopy;  Laterality: N/A;  155    Family History  Problem Relation Age of Onset  . Colon cancer Neg Hx   . Diabetes Father   . Heart attack Maternal Grandmother 30    multiple over lifetime.   Social History:  reports that she has quit smoking. Her smoking use included Cigarettes. She has never used smokeless tobacco. She reports  that she drinks alcohol. She reports that she does not use illicit drugs.  Allergies: No Known Allergies  Medications reviewed   (Not in a hospital admission)  Results for orders placed or performed during the hospital encounter of 06/17/14 (from the past 48 hour(s))  CBC with Differential     Status: Abnormal   Collection Time: 06/17/14 11:50 PM  Result Value Ref Range   WBC 3.6 (L) 4.0 - 10.5 K/uL   RBC 3.80 (L) 3.87 - 5.11 MIL/uL   Hemoglobin 11.3 (L) 12.0 - 15.0 g/dL   HCT 35.6 (L) 36.0 - 46.0 %   MCV 93.7 78.0 - 100.0 fL   MCH 29.7 26.0 - 34.0 pg   MCHC 31.7 30.0 - 36.0 g/dL   RDW 15.9 (H) 11.5 - 15.5 %   Platelets 175 150 - 400 K/uL   Neutrophils Relative % 17 (L) 43 - 77 %   Lymphocytes Relative 56 (H) 12 - 46 %   Monocytes Relative 26 (H) 3 - 12 %   Eosinophils Relative 1 0 - 5 %   Basophils Relative 0 0 - 1 %   Band Neutrophils 0 0 - 10 %   Metamyelocytes Relative 0 %  Myelocytes 0 %   Promyelocytes Absolute 0 %   Blasts 0 %   nRBC 0 0 /100 WBC   Neutro Abs 0.6 (L) 1.7 - 7.7 K/uL   Lymphs Abs 2.1 0.7 - 4.0 K/uL   Monocytes Absolute 0.9 0.1 - 1.0 K/uL   Eosinophils Absolute 0.0 0.0 - 0.7 K/uL   Basophils Absolute 0.0 0.0 - 0.1 K/uL   WBC Morphology ATYPICAL LYMPHOCYTES   Basic metabolic panel     Status: Abnormal   Collection Time: 06/17/14 11:50 PM  Result Value Ref Range   Sodium 135 135 - 145 mmol/L   Potassium 3.9 3.5 - 5.1 mmol/L   Chloride 106 96 - 112 mmol/L   CO2 27 19 - 32 mmol/L   Glucose, Bld 118 (H) 70 - 99 mg/dL   BUN 12 6 - 23 mg/dL   Creatinine, Ser 0.68 0.50 - 1.10 mg/dL   Calcium 8.4 8.4 - 10.5 mg/dL   GFR calc non Af Amer >90 >90 mL/min   GFR calc Af Amer >90 >90 mL/min    Comment: (NOTE) The eGFR has been calculated using the CKD EPI equation. This calculation has not been validated in all clinical situations. eGFR's persistently <90 mL/min signify possible Chronic Kidney Disease.    Anion gap 2 (L) 5 - 15   Dg Chest 2  View  06/18/2014   CLINICAL DATA:  Dizziness, nausea, weakness, fever, and diarrhea. Patient has breast, liver, and bone cancer newly diagnosed. First chemotherapy on Tuesday.  EXAM: CHEST  2 VIEW  COMPARISON:  CT chest 05/16/2014  FINDINGS: Power port type right central venous catheter with tip over the upper SVC region. No pneumothorax. Postoperative changes in the lower cervical spine. Normal heart size and pulmonary vascularity. No focal airspace disease or consolidation in the lungs. No blunting of costophrenic angles. No pneumothorax. Mediastinal contours appear intact.  IMPRESSION: No active cardiopulmonary disease.   Electronically Signed   By: Lucienne Capers M.D.   On: 06/18/2014 00:20    Review of Systems  Constitutional: Positive for fever. Negative for chills, weight loss, malaise/fatigue and diaphoresis.  HENT: Negative for congestion, ear discharge, ear pain, hearing loss, nosebleeds, sore throat and tinnitus.   Eyes: Negative for blurred vision, double vision, photophobia, pain, discharge and redness.  Respiratory: Negative for cough, hemoptysis, sputum production, shortness of breath, wheezing and stridor.   Cardiovascular: Negative for chest pain, palpitations, orthopnea, claudication, leg swelling and PND.  Gastrointestinal: Negative for heartburn, nausea, vomiting, abdominal pain, diarrhea, constipation, blood in stool and melena.  Genitourinary: Negative for dysuria, urgency, frequency and hematuria.  Musculoskeletal: Negative for myalgias, back pain, joint pain, falls and neck pain.  Skin: Negative for itching and rash.  Neurological: Negative for dizziness, tingling, tremors, sensory change, speech change, focal weakness, seizures, loss of consciousness, weakness and headaches.  Endo/Heme/Allergies: Negative for environmental allergies and polydipsia. Does not bruise/bleed easily.  Psychiatric/Behavioral: Negative for depression, suicidal ideas, hallucinations, memory loss and  substance abuse. The patient is not nervous/anxious and does not have insomnia.     Blood pressure 125/58, pulse 109, temperature 99.2 F (37.3 C), temperature source Rectal, resp. rate 22, height 5' 8.5" (1.74 m), weight 110.678 kg (244 lb), SpO2 99 %. Physical Exam  Constitutional: She is oriented to person, place, and time. She appears well-developed and well-nourished.  HENT:  Head: Normocephalic and atraumatic.  Eyes: Conjunctivae and EOM are normal. Pupils are equal, round, and reactive to light.  Neck: Normal range of motion.  Neck supple. No JVD present. No tracheal deviation present. No thyromegaly present.  Cardiovascular: Normal rate and regular rhythm.  Exam reveals no gallop and no friction rub.   No murmur heard. Respiratory: Effort normal and breath sounds normal.  GI: Soft. Bowel sounds are normal. She exhibits no distension. There is no tenderness. There is no rebound and no guarding.  Musculoskeletal: Normal range of motion. She exhibits no edema or tenderness.  Lymphadenopathy:    She has no cervical adenopathy.  Neurological: She is alert and oriented to person, place, and time. She has normal reflexes. She displays normal reflexes. No cranial nerve deficit. She exhibits normal muscle tone. Coordination normal.  Skin: Skin is warm and dry. No rash noted. No erythema. No pallor.  Psychiatric: She has a normal mood and affect. Her behavior is normal. Judgment and thought content normal.     Assessment/Plan Fever Check blood culture x 2 sets Check ua Start on vanco, cefepime 2gm iv q8 empirically  Pancytopenia Please consult oncology in the am Check cbc in am  Abnormal lft Check cmp in am  DVT prophylaxis  Scd, lovenox  Arlo Butt 06/18/2014, 1:17 AM

## 2014-06-18 NOTE — Progress Notes (Signed)
The patient is a 49 year old woman with a recent diagnosis of stage IV metastatic cancer, presumed to be breast cancer, who was admitted earlier this morning for high fevers at home.  She was seen and examined. Her chart, vital signs, and laboratory studies were reviewed. We'll continue current management as ordered with a few additions. She also has had some loose stools-diarrhea and transient nausea, but no vomiting. She continues to have bony pain. C. difficile PCR, GI pathogen panel, stool culture ordered and pending. We'll continue cefepime and vancomycin empirically, but there is a low threshold for starting metronidazole.  -We will downgrade her diet to full liquids per request. -We'll add when necessary IV morphine. [facility] consult oncology for further recommendations and management.

## 2014-06-18 NOTE — Care Management Utilization Note (Signed)
UR completed 

## 2014-06-18 NOTE — Progress Notes (Signed)
Late Entry:  MD was notified today due to the patients c/o headache and nasal dryness.  New orders given and followed.

## 2014-06-18 NOTE — Consult Note (Signed)
Inpatient Hematology/Oncology Consultation   Name: [name]      MRN: [id number]    Location: A302/A302-01  Date: 06/18/2014 Time:4:42 PM   REFERRING PHYSICIAN:  Rexene Alberts MD REASON FOR CONSULT:  Neutropenic Fever    DIAGNOSIS:  Neutropenic Fever   Stage IV breast cancer   Bone Metastases/Liver metastases   HISTORY OF PRESENT ILLNESS:  49 year old female well known to me with Stage IV breast cancer currently on Taxotere, Perjeta, Herceptin. She presented to the ED with fever, diarrhea. Fever reported at 101. [place] on admission was 600.  She was pan-cultured and started on cefepime and vancomycin.  Last episode of diarrhea was around noon today.  She denies nausea, vomiting. Complains of mouth tenderness.  U/A is negative. Blood cultures are negative thus far.   PAST MEDICAL HISTORY:   Past Medical History  Diagnosis Date  . PONV (postoperative nausea and vomiting)     pt states scope patch does well  . Anemia   . Depression   . Anxiety   . Headache(784.0)     has migraines - medication controls  . Breast cancer 04/2014    Presumed dx; stage 4 w/ mets to bone and liver  . Bony metastasis     ALLERGIES: No Known Allergies    MEDICATIONS: I have reviewed the patient's current medications.     PAST SURGICAL HISTORY Past Surgical History  Procedure Laterality Date  . Right rotator cuff      2002  . Neck fusion      2003  . Laparoscopic cholecystectomy      2004  . Right knee arthroscopy      2005  . Colon surgery      2008  . Appendectomy      2008  . Abdominal hysterectomy    . Breast reduction surgery  03/17/2011    Procedure: MAMMARY REDUCTION BILATERAL (BREAST);  Surgeon: Mary A Contogiannis;  Location: [facility];  Service: Plastics;  Laterality: Bilateral;  . Colonoscopy N/A 07/13/2013    Procedure: COLONOSCOPY;  Surgeon: Rogene Houston, MD;  [facility];  Service: Endoscopy;  Laterality: N/A;  930  . Liver biopsy   04/2014  . Portacath placement    . Esophagogastroduodenoscopy N/A 05/25/2014    Procedure: ESOPHAGOGASTRODUODENOSCOPY (EGD);  Surgeon: Rogene Houston, MD;  [facility];  Service: Endoscopy;  Laterality: N/A;  155    FAMILY HISTORY: Family History  Problem Relation Age of Onset  . Colon cancer Neg Hx   . Diabetes Father   . Heart attack Maternal Grandmother 30    multiple over lifetime.    SOCIAL HISTORY:  reports that she has quit smoking. Her smoking use included Cigarettes. She has never used smokeless tobacco. She reports that she drinks alcohol. She reports that she does not use illicit drugs.  PERFORMANCE STATUS: The patient's performance status is 1 - Symptomatic but completely ambulatory  PHYSICAL EXAM: Most Recent Vital Signs: Blood pressure 113/64, pulse 96, temperature 98.7 F (37.1 C), temperature source Oral, resp. rate 14, height 5' 8.5" (1.74 m), weight 249 lb 4.8 oz (113.082 kg), SpO2 98 %. General appearance: alert, cooperative and no distress Head: Normocephalic, without obvious abnormality, atraumatic Throat: lips, mucosa, and tongue normal; teeth and gums normal and mild mucositis Lungs: clear to auscultation bilaterally Breasts: normal appearance, no masses or tenderness Abdomen: soft, non-tender; bowel sounds normal; no masses,  no organomegaly Extremities: extremities  normal, atraumatic, no cyanosis or edema Skin: Skin color, texture, turgor normal. No rashes or lesions Lymph nodes: Cervical, supraclavicular, and axillary nodes normal. Neurologic: Alert and oriented X 3, normal strength and tone. Normal symmetric reflexes. Normal coordination and gait  LABORATORY DATA:  Results for orders placed or performed during the hospital encounter of 06/17/14 (from the past 48 hour(s))  CBC with Differential     Status: Abnormal   Collection Time: 06/17/14 11:50 PM  Result Value Ref Range   WBC 3.6 (L) 4.0 - 10.5 K/uL   RBC 3.80 (L) 3.87 - 5.11 MIL/uL    Hemoglobin 11.3 (L) 12.0 - 15.0 g/dL   HCT 35.6 (L) 36.0 - 46.0 %   MCV 93.7 78.0 - 100.0 fL   MCH 29.7 26.0 - 34.0 pg   MCHC 31.7 30.0 - 36.0 g/dL   RDW 15.9 (H) 11.5 - 15.5 %   Platelets 175 150 - 400 K/uL   Neutrophils Relative % 17 (L) 43 - 77 %   Lymphocytes Relative 56 (H) 12 - 46 %   Monocytes Relative 26 (H) 3 - 12 %   Eosinophils Relative 1 0 - 5 %   Basophils Relative 0 0 - 1 %   Band Neutrophils 0 0 - 10 %   Metamyelocytes Relative 0 %   Myelocytes 0 %   Promyelocytes Absolute 0 %   Blasts 0 %   nRBC 0 0 /100 WBC   Neutro Abs 0.6 (L) 1.7 - 7.7 K/uL   Lymphs Abs 2.1 0.7 - 4.0 K/uL   Monocytes Absolute 0.9 0.1 - 1.0 K/uL   Eosinophils Absolute 0.0 0.0 - 0.7 K/uL   Basophils Absolute 0.0 0.0 - 0.1 K/uL   WBC Morphology ATYPICAL LYMPHOCYTES   Basic metabolic panel     Status: Abnormal   Collection Time: 06/17/14 11:50 PM  Result Value Ref Range   Sodium 135 135 - 145 mmol/L   Potassium 3.9 3.5 - 5.1 mmol/L   Chloride 106 96 - 112 mmol/L   CO2 27 19 - 32 mmol/L   Glucose, Bld 118 (H) 70 - 99 mg/dL   BUN 12 6 - 23 mg/dL   Creatinine, Ser 0.68 0.50 - 1.10 mg/dL   Calcium 8.4 8.4 - 10.5 mg/dL   GFR calc non Af Amer >90 >90 mL/min   GFR calc Af Amer >90 >90 mL/min    Comment: (NOTE) The eGFR has been calculated using the CKD EPI equation. This calculation has not been validated in all clinical situations. eGFR's persistently <90 mL/min signify possible Chronic Kidney Disease.    Anion gap 2 (L) 5 - 15  Blood culture (routine x 2)     Status: None (Preliminary result)   Collection Time: 06/18/14 12:54 AM  Result Value Ref Range   Specimen Description BLOOD RIGHT HAND    Special Requests BOTTLES DRAWN AEROBIC AND ANAEROBIC 6CC    Culture NO GROWTH <24 HRS    Report Status PENDING   Blood culture (routine x 2)     Status: None (Preliminary result)   Collection Time: 06/18/14 12:58 AM  Result Value Ref Range   Specimen Description LEFT ANTECUBITAL    Special  Requests BOTTLES DRAWN AEROBIC AND ANAEROBIC [place]    Culture NO GROWTH <24 HRS    Report Status PENDING   Lactic acid, plasma     Status: None   Collection Time: 06/18/14 12:58 AM  Result Value Ref Range   Lactic Acid, Venous 1.2  0.5 - 2.0 mmol/L  Lactic acid, plasma     Status: None   Collection Time: 06/18/14  4:11 AM  Result Value Ref Range   Lactic Acid, Venous 1.2 0.5 - 2.0 mmol/L  CBC with Differential/Platelet     Status: Abnormal   Collection Time: 06/18/14  4:11 AM  Result Value Ref Range   WBC 4.8 4.0 - 10.5 K/uL   RBC 3.74 (L) 3.87 - 5.11 MIL/uL   Hemoglobin 11.2 (L) 12.0 - 15.0 g/dL   HCT 35.1 (L) 36.0 - 46.0 %   MCV 93.9 78.0 - 100.0 fL   MCH 29.9 26.0 - 34.0 pg   MCHC 31.9 30.0 - 36.0 g/dL   RDW 16.0 (H) 11.5 - 15.5 %   Platelets 172 150 - 400 K/uL   Neutrophils Relative % 19 (L) 43 - 77 %   Neutro Abs 0.9 (L) 1.7 - 7.7 K/uL   Lymphocytes Relative 48 (H) 12 - 46 %   Lymphs Abs 2.3 0.7 - 4.0 K/uL   Monocytes Relative 27 (H) 3 - 12 %   Monocytes Absolute 1.3 (H) 0.1 - 1.0 K/uL   Eosinophils Relative 1 0 - 5 %   Eosinophils Absolute 0.1 0.0 - 0.7 K/uL   Basophils Relative 5 (H) 0 - 1 %   Basophils Absolute 0.3 (H) 0.0 - 0.1 K/uL   WBC Morphology ATYPICAL LYMPHOCYTES     Comment: MILD LEFT SHIFT (1-5% METAS, OCC MYELO, OCC BANDS)  Comprehensive metabolic panel     Status: Abnormal   Collection Time: 06/18/14  4:11 AM  Result Value Ref Range   Sodium 136 135 - 145 mmol/L   Potassium 3.8 3.5 - 5.1 mmol/L   Chloride 106 96 - 112 mmol/L   CO2 26 19 - 32 mmol/L   Glucose, Bld 115 (H) 70 - 99 mg/dL   BUN 13 6 - 23 mg/dL   Creatinine, Ser 0.68 0.50 - 1.10 mg/dL   Calcium 8.1 (L) 8.4 - 10.5 mg/dL   Total Protein 6.0 6.0 - 8.3 g/dL   Albumin 2.8 (L) 3.5 - 5.2 g/dL   AST 27 0 - 37 U/L   ALT 25 0 - 35 U/L   Alkaline Phosphatase 152 (H) 39 - 117 U/L   Total Bilirubin 0.6 0.3 - 1.2 mg/dL   GFR calc non Af Amer >90 >90 mL/min   GFR calc Af Amer >90 >90 mL/min     Comment: (NOTE) The eGFR has been calculated using the CKD EPI equation. This calculation has not been validated in all clinical situations. eGFR's persistently <90 mL/min signify possible Chronic Kidney Disease.    Anion gap 4 (L) 5 - 15  Urinalysis, Routine w reflex microscopic     Status: Abnormal   Collection Time: 06/18/14  4:15 AM  Result Value Ref Range   Color, Urine YELLOW YELLOW   APPearance CLEAR CLEAR   Specific Gravity, Urine <1.005 (L) 1.005 - 1.030   pH 6.5 5.0 - 8.0   Glucose, UA NEGATIVE NEGATIVE mg/dL   Hgb urine dipstick NEGATIVE NEGATIVE   Bilirubin Urine NEGATIVE NEGATIVE   Ketones, ur NEGATIVE NEGATIVE mg/dL   Protein, ur NEGATIVE NEGATIVE mg/dL   Urobilinogen, UA 0.2 0.0 - 1.0 mg/dL   Nitrite NEGATIVE NEGATIVE   Leukocytes, UA NEGATIVE NEGATIVE    Comment: MICROSCOPIC NOT DONE ON URINES WITH NEGATIVE PROTEIN, BLOOD, LEUKOCYTES, NITRITE, OR GLUCOSE <1000 mg/dL.      RADIOGRAPHY: Dg Chest 2 View  06/18/2014  CLINICAL DATA:  Dizziness, nausea, weakness, fever, and diarrhea. Patient has breast, liver, and bone cancer newly diagnosed. First chemotherapy on Tuesday.  EXAM: CHEST  2 VIEW  COMPARISON:  CT chest 05/16/2014  FINDINGS: Power port type right central venous catheter with tip over the upper SVC region. No pneumothorax. Postoperative changes in the lower cervical spine. Normal heart size and pulmonary vascularity. No focal airspace disease or consolidation in the lungs. No blunting of costophrenic angles. No pneumothorax. Mediastinal contours appear intact.  IMPRESSION: No active cardiopulmonary disease.   Electronically Signed   By: Lucienne Capers M.D.   On: 06/18/2014 00:20       ASSESSMENT:  Neutropenic Fever in patient undergoing cytotoxic chemotherapy Stage IV breast cancer Oral mucositis -- mild Diarrhea -- most likely treatment related  PLAN:   Continue antibiotics.  CBC in the am.  If ANC over 1000 tomorrow and afebrile for 48 hours, cultures  negative: OK to discharge. Will start on chlorhexidene rinse and magic mouthwash for mucositis.   Molli Hazard MD

## 2014-06-18 NOTE — ED Provider Notes (Signed)
CSN: [id number]     Arrival date & time 06/17/14  2304 History   First MD Initiated Contact with Patient 06/17/14 2315     Chief Complaint  Patient presents with  . Fever     (Consider location/radiation/quality/duration/timing/severity/associated sxs/prior Treatment) HPI   49yF with fever, fatigue and general malaise. Onset 2-3 days ago and progressively worsening. Hx of metastatic CA currently undergoing chemo. Fever to 101. No cough or sob. Nausea. No vomiting. A couple loose stools. No urinary complaints. No sick contacts.  Past Medical History  Diagnosis Date  . PONV (postoperative nausea and vomiting)     pt states scope patch does well  . Anemia   . Depression   . Anxiety   . Headache(784.0)     has migraines - medication controls  . Breast cancer 04/2014   Past Surgical History  Procedure Laterality Date  . Right rotator cuff      2002  . Neck fusion      2003  . Laparoscopic cholecystectomy      2004  . Right knee arthroscopy      2005  . Colon surgery      2008  . Appendectomy      2008  . Abdominal hysterectomy    . Breast reduction surgery  03/17/2011    Procedure: MAMMARY REDUCTION BILATERAL (BREAST);  Surgeon: Mary A Contogiannis;  Location: [place];  Service: Plastics;  Laterality: Bilateral;  . Colonoscopy N/A 07/13/2013    Procedure: COLONOSCOPY;  Surgeon: Rogene Houston, MD;  [facility];  Service: Endoscopy;  Laterality: N/A;  930  . Liver biopsy  04/2014  . Portacath placement    . Esophagogastroduodenoscopy N/A 05/25/2014    Procedure: ESOPHAGOGASTRODUODENOSCOPY (EGD);  Surgeon: Rogene Houston, MD;  [facility];  Service: Endoscopy;  Laterality: N/A;  155   Family History  Problem Relation Age of Onset  . Colon cancer Neg Hx   . Diabetes Father   . Heart attack Maternal Grandmother 30    multiple over lifetime.   History  Substance Use Topics  . Smoking status: Former Smoker    Types: Cigarettes  .  Smokeless tobacco: Never Used  . Alcohol Use: Yes     Comment: Occasionally   OB History    No data available     Review of Systems  All systems reviewed and negative, other than as noted in HPI.   Allergies  Review of patient's allergies indicates no known allergies.  Home Medications   Prior to Admission medications   Medication Sig Start Date End Date Taking? Authorizing Provider  acetaminophen (TYLENOL) 325 MG tablet Take 650 mg by mouth every 6 (six) hours as needed.   Yes Historical Provider, MD  ALPRAZolam Duanne Moron) 0.5 MG tablet Take 1 tablet (0.5 mg total) by mouth 3 (three) times daily as needed for anxiety or sleep. 04/27/14  Yes Radene Gunning, NP  calcium citrate-vitamin D (CITRACAL+D) 315-200 MG-UNIT per tablet Take 1 tablet by mouth 2 (two) times daily.   Yes Historical Provider, MD  Calcium-Phosphorus-Vitamin D (CALCIUM/D3 ADULT GUMMIES PO) Take by mouth. Patient taking 4 gummies a day to equal her dosage of calcium 1200mg  and vitamin d 1000mg    Yes Historical Provider, MD  dexamethasone (DECADRON) 4 MG tablet 2 tablet bid starting day before chemotherapy, day of and day after 06/12/14  Yes Molli Hazard, MD  diphenhydrAMINE (BENADRYL) 25 mg capsule Take 25 mg by mouth  every 6 (six) hours as needed for itching.   Yes Historical Provider, MD  Docusate Calcium (STOOL SOFTENER PO) Take 1 capsule by mouth 2 (two) times daily.    Yes Historical Provider, MD  HYDROcodone-acetaminophen (NORCO) 7.5-325 MG per tablet Take 1 tablet by mouth every 8 (eight) hours as needed for moderate pain. 05/31/14  Yes Molli Hazard, MD  lidocaine-prilocaine (EMLA) cream Apply 1 application topically daily as needed (apply to port before chemo).  05/17/14  Yes Historical Provider, MD  morphine (MS CONTIN) 30 MG 12 hr tablet Take 1 tablet (30 mg total) by mouth every 12 (twelve) hours. 05/30/14  Yes Molli Hazard, MD  naproxen sodium (ANAPROX) 220 MG tablet Take 220 mg by  mouth 2 (two) times daily as needed (pain).   Yes Historical Provider, MD  ondansetron (ZOFRAN) 8 MG tablet Take 1 tablet (8 mg total) by mouth every 8 (eight) hours as needed for nausea or vomiting. 05/17/14  Yes Molli Hazard, MD  pantoprazole (PROTONIX) 40 MG tablet Take 1 tablet (40 mg total) by mouth daily. 05/16/14  Yes Molli Hazard, MD  polyethylene glycol (MIRALAX / GLYCOLAX) packet Take 17 g by mouth daily as needed for mild constipation.   Yes Historical Provider, MD  sertraline (ZOLOFT) 100 MG tablet Take 2 tablets (200 mg total) by mouth daily. 06/12/14  Yes Molli Hazard, MD  SUMAtriptan (IMITREX) 100 MG tablet Take 100 mg by mouth as needed. foir migraine 03/26/14  Yes Historical Provider, MD  tamoxifen (NOLVADEX) 20 MG tablet Take 1 tablet (20 mg total) by mouth daily. 05/09/14  Yes Molli Hazard, MD  zolpidem (AMBIEN) 10 MG tablet Take 1 tablet (10 mg total) by mouth at bedtime as needed for sleep. 06/12/14 07/12/14 Yes Molli Hazard, MD   BP 125/58 mmHg  Pulse 109  Temp(Src) 98.9 F (37.2 C) (Oral)  Resp 22  Ht 5' 8.5" (1.74 m)  Wt 244 lb (110.678 kg)  BMI 36.56 kg/m2  SpO2 99% Physical Exam  Constitutional: She appears well-developed and well-nourished. No distress.  HENT:  Head: Normocephalic and atraumatic.  Eyes: Conjunctivae are normal. Right eye exhibits no discharge. Left eye exhibits no discharge.  Neck: Neck supple.  Cardiovascular: Normal rate, regular rhythm and normal heart sounds.  Exam reveals no gallop and no friction rub.   No murmur heard. Pulmonary/Chest: Effort normal and breath sounds normal. No respiratory distress.  Abdominal: Soft. She exhibits no distension. There is no tenderness.  Musculoskeletal: She exhibits no edema or tenderness.  Lower extremities symmetric as compared to each other. No calf tenderness. Negative Homan's. No palpable cords.   Neurological: She is alert.  Skin: Skin is warm and dry.   Psychiatric: She has a normal mood and affect. Her behavior is normal. Thought content normal.  Nursing note and vitals reviewed.   ED Course  Procedures (including critical care time) Labs Review Labs Reviewed  CBC WITH DIFFERENTIAL/PLATELET - Abnormal; Notable for the following:    WBC 3.6 (*)    RBC 3.80 (*)    Hemoglobin 11.3 (*)    HCT 35.6 (*)    RDW 15.9 (*)    Neutrophils Relative % 17 (*)    Lymphocytes Relative 56 (*)    Monocytes Relative 26 (*)    Neutro Abs 0.6 (*)    All other components within normal limits  BASIC METABOLIC PANEL - Abnormal; Notable for the following:    Glucose, Bld 118 (*)  Anion gap 2 (*)    All other components within normal limits  CULTURE, BLOOD (ROUTINE X 2)  CULTURE, BLOOD (ROUTINE X 2)  URINALYSIS, ROUTINE W REFLEX MICROSCOPIC  LACTIC ACID, PLASMA  LACTIC ACID, PLASMA    Imaging Review Dg Chest 2 View  06/18/2014   CLINICAL DATA:  Dizziness, nausea, weakness, fever, and diarrhea. Patient has breast, liver, and bone cancer newly diagnosed. First chemotherapy on Tuesday.  EXAM: CHEST  2 VIEW  COMPARISON:  CT chest 05/16/2014  FINDINGS: Power port type right central venous catheter with tip over the upper SVC region. No pneumothorax. Postoperative changes in the lower cervical spine. Normal heart size and pulmonary vascularity. No focal airspace disease or consolidation in the lungs. No blunting of costophrenic angles. No pneumothorax. Mediastinal contours appear intact.  IMPRESSION: No active cardiopulmonary disease.   Electronically Signed   By: Lucienne Capers M.D.   On: 06/18/2014 00:20     EKG Interpretation None      MDM   Final diagnoses:  Neutropenic fever  Metastatic adenocarcinoma    49yF with fever of up to 101 at home. On chemo for metastatic adenocarcinoma. Neutropenic. No obvious source. Primary symptom is myalgias and sore throat. No appreciable pharyngitis/mucositis on exam. May be viral illness. With [place] on 600  though and unclear when may nadir, ordered empiric cefepime. Blood cultures. Admission.     Virgel Manifold, MD 06/21/14 1016

## 2014-06-19 DIAGNOSIS — C7951 Secondary malignant neoplasm of bone: Secondary | ICD-10-CM

## 2014-06-19 DIAGNOSIS — K1231 Oral mucositis (ulcerative) due to antineoplastic therapy: Secondary | ICD-10-CM

## 2014-06-19 LAB — BASIC METABOLIC PANEL
Anion gap: 7 (ref 5–15)
BUN: 7 mg/dL (ref 6–23)
CALCIUM: 7.9 mg/dL — AB (ref 8.4–10.5)
CO2: 25 mmol/L (ref 19–32)
Chloride: 109 mmol/L (ref 96–112)
Creatinine, Ser: 0.68 mg/dL (ref 0.50–1.10)
GLUCOSE: 95 mg/dL (ref 70–99)
POTASSIUM: 3.8 mmol/L (ref 3.5–5.1)
SODIUM: 141 mmol/L (ref 135–145)

## 2014-06-19 LAB — FECAL LACTOFERRIN, QUANT: Fecal Lactoferrin: POSITIVE

## 2014-06-19 LAB — CBC
HCT: 32.6 % — ABNORMAL LOW (ref 36.0–46.0)
HEMOGLOBIN: 10.3 g/dL — AB (ref 12.0–15.0)
MCH: 29.9 pg (ref 26.0–34.0)
MCHC: 31.6 g/dL (ref 30.0–36.0)
MCV: 94.8 fL (ref 78.0–100.0)
Platelets: 182 10*3/uL (ref 150–400)
RBC: 3.44 MIL/uL — AB (ref 3.87–5.11)
RDW: 16.8 % — AB (ref 11.5–15.5)
WBC: 24.3 10*3/uL — AB (ref 4.0–10.5)

## 2014-06-19 MED ORDER — MAGIC MOUTHWASH: 10.0000 mL | Freq: Four times a day (QID) | ORAL | Status: DC | PRN

## 2014-06-19 MED ORDER — SALINE SPRAY 0.65 % NA SOLN: 1.0000 | NASAL | Status: DC | PRN

## 2014-06-19 MED ORDER — CIPROFLOXACIN HCL 500 MG PO TABS: 500.0000 mg | ORAL_TABLET | Freq: Two times a day (BID) | ORAL | Status: DC

## 2014-06-19 MED ORDER — CHLORHEXIDINE GLUCONATE 0.12 % MT SOLN: 15.0000 mL | Freq: Four times a day (QID) | OROMUCOSAL | Status: DC

## 2014-06-19 MED ORDER — LIDOCAINE VISCOUS 2 % MT SOLN
5.0000 mL | Freq: Four times a day (QID) | OROMUCOSAL | Status: DC | PRN
Start: 2014-06-19 — End: 2014-11-26

## 2014-06-19 NOTE — Progress Notes (Signed)
Discharge instructions reviewed with the pt. No current questions or concerns. Pt informed of upcoming appointments and medications reviewed. IV removed and tele removed.

## 2014-06-19 NOTE — Care Management Note (Signed)
    Page 1 of 1   06/19/2014     1:10:51 PM CARE MANAGEMENT NOTE 06/19/2014  Patient:  KYANNE, RIALS   Account Number:  [id number]  Date Initiated:  06/19/2014  Documentation initiated by:  Jolene Provost  Subjective/Objective Assessment:   Pt is from home with self care. Pt has no HH services, DME's or med needs prior to admission. Pt currently receiving treatment for stage for cancer. Pt discharging today home with self care. No CM needs identified.     Action/Plan:   Anticipated DC Date:  06/19/2014   Anticipated DC Plan:  [place]  CM consult      Choice offered to / List presented to:             Status of service:  Completed, signed off Medicare Important Message given?   (If response is "NO", the following Medicare IM given date fields will be blank) Date Medicare IM given:   Medicare IM given by:   Date Additional Medicare IM given:   Additional Medicare IM given by:    Discharge Disposition:  HOME/SELF CARE  Per UR Regulation:  Reviewed for med. necessity/level of care/duration of stay  If discussed at [place] of Stay Meetings, dates discussed:    Comments:  06/19/2014 [place], RN, MSN, CM

## 2014-06-19 NOTE — Discharge Summary (Signed)
Physician Discharge Summary  [place] DJM:[id number] DOB: 1965/11/15 DOA: 06/17/2014  PCP: [name], MD  Admit date: 06/17/2014 Discharge date: 06/19/2014  Time spent: Greater than 30 minutes  Recommendations for Outpatient Follow-up:  1. The patient will follow-up with oncology next week as scheduled.  2. Recommend follow-up of stool culture and GI pathogen panel at her hospital follow-up appointment.  Discharge Diagnoses:   1. Nausea, vomiting, diarrhea, likely secondary to recent chemotherapy. --Also consider short-lived acute gastroenteritis. ---C. difficile negative. --- GI pathogen and stool culture pending at the time of discharge. 2. Neutropenic fever, status post chemotherapy. 3. Anemia and leukopenia, secondary to recent chemotherapy. 4. Leukocytosis, status post outpatient Neupogen. 5. Oral mucositis. 6. Stage IV breast cancer with metastasis to the bone and liver. 7. Chronic pain syndrome secondary to malignancy.  Discharge Condition: Improved.  Diet recommendation: Regular as tolerated.  Filed Weights   06/17/14 2314 06/18/14 0300 06/19/14 0624  Weight: 110.678 kg (244 lb) 113.082 kg (249 lb 4.8 oz) 113.399 kg (250 lb)    History of present illness:  The patient is a 49 year old woman with a recent diagnosis of prostatic breast cancer, who presented to the emergency department on 06/17/14 with a chief complaint of fever at home with associated nausea, vomiting, and diarrhea. In the ED, her temperature was 99.2. She was otherwise hemodynamically stable. Her lab data were significant for a WBC of 3.6 (absolute neutrophil count 0.6), hemoglobin 11.3, normal platelet count, and normal lactic acid level. Her chest xray revealed no acute disease. She was admitted for further evaluation and management.  Hospital Course:  The patient was started empirically on vancomycin and cefepime. For further evaluation, blood cultures, urinalysis, stool culture, C. difficile PCR,  and GI pathogen panel were ordered. She was given IV fluids for hydration, antiemetics as needed for nausea/vomiting, and when necessary IV morphine superimposed on her chronic opiate analgesics. Her urinalysis was without bacteria or WBCs. Her C. difficile by PCR was negative. Fecal lactoferrin was positive. The stool culture and GI pathogen panel results were pending at the time of discharge. Her blood cultures were negative to date.  Oncology was consulted for further recommendations. Dr. Whitney Muse agree with medical management. Per her assessment, the patient likely had diarrhea from cytotoxic chemotherapy. She also noted that the patient had mucositis and ordered chlorhexidine rinse and Magic mouthwash. The patient's nausea, vomiting, and diarrhea resolved. Her diet was advanced which she tolerated well. She remained afebrile during the hospital course. Her WBC increased from 3.6 to 24.3. According to the patient, she had received Neulasta several days prior to hospital admission. This was confirmed by chart review. Her leukocytosis was likely secondary to it. Nevertheless, she was discharged on several more days of Cipro. She will follow-up with oncology as scheduled next week.   Procedures:  None  Consultations:  Oncology  Discharge Exam: Filed Vitals:   06/19/14 0624  BP: 118/61  Pulse: 96  Temp: 98.7 F (37.1 C)  Resp: 20    General: Pleasant smiling 49 year old in no acute distress. Cardiovascular: S1, S2, with no murmurs rubs or gallops. Respiratory: clear to auscultation bilaterally.  Discharge Instructions   Discharge Instructions    Diet general    Complete by:  As directed      Increase activity slowly    Complete by:  As directed           Current Discharge Medication List    START taking these medications   Details  Alum &  Mag Hydroxide-Simeth (MAGIC MOUTHWASH) SOLN Take 10 mLs by mouth 4 (four) times daily as needed for mouth pain. Qty: 120 mL, Refills: 0     chlorhexidine (PERIDEX) 0.12 % solution Use as directed 15 mLs in the mouth or throat 4 (four) times daily. Qty: 120 mL, Refills: 0    ciprofloxacin (CIPRO) 500 MG tablet Take 1 tablet (500 mg total) by mouth 2 (two) times daily. Qty: 10 tablet, Refills: 0    lidocaine (XYLOCAINE) 2 % solution Use as directed 5 mLs in the mouth or throat 4 (four) times daily as needed for mouth pain. Qty: 100 mL, Refills: 0    sodium chloride (OCEAN) 0.65 % SOLN nasal spray Place 1 spray into both nostrils as needed for congestion.      CONTINUE these medications which have NOT CHANGED   Details  acetaminophen (TYLENOL) 325 MG tablet Take 650 mg by mouth every 6 (six) hours as needed for mild pain or fever.     ALPRAZolam (XANAX) 0.5 MG tablet Take 1 tablet (0.5 mg total) by mouth 3 (three) times daily as needed for anxiety or sleep. Qty: 10 tablet, Refills: 0    Calcium-Phosphorus-Vitamin D (CALCIUM/D3 ADULT GUMMIES PO) Take by mouth. Patient taking 4 gummies a day to equal her dosage of calcium 1200mg  and vitamin d 1000mg     dexamethasone (DECADRON) 4 MG tablet 2 tablet bid starting day before chemotherapy, day of and day after Qty: 60 tablet, Refills: 1   Associated Diagnoses: Breast cancer, stage 4, unspecified laterality    diphenhydrAMINE (BENADRYL) 25 mg capsule Take 25 mg by mouth every 6 (six) hours as needed for itching.    Docusate Calcium (STOOL SOFTENER PO) Take 1 capsule by mouth 2 (two) times daily.     HYDROcodone-acetaminophen (NORCO) 7.5-325 MG per tablet Take 1 tablet by mouth every 8 (eight) hours as needed for moderate pain. Qty: 30 tablet, Refills: 0   Associated Diagnoses: Breast cancer, stage 4, unspecified laterality    lidocaine-prilocaine (EMLA) cream Apply 1 application topically daily as needed (apply to port before chemo).    Associated Diagnoses: Breast cancer, stage 4, unspecified laterality; Adenocarcinoma determined by biopsy of liver    morphine (MS CONTIN)  30 MG 12 hr tablet Take 1 tablet (30 mg total) by mouth every 12 (twelve) hours. Qty: 60 tablet, Refills: 0   Associated Diagnoses: Breast cancer, stage 4, unspecified laterality    ondansetron (ZOFRAN) 8 MG tablet Take 1 tablet (8 mg total) by mouth every 8 (eight) hours as needed for nausea or vomiting. Qty: 30 tablet, Refills: 1   Associated Diagnoses: Breast cancer, stage 4, unspecified laterality    pantoprazole (PROTONIX) 40 MG tablet Take 1 tablet (40 mg total) by mouth daily. Qty: 30 tablet, Refills: 3   Associated Diagnoses: Adenocarcinoma determined by biopsy of liver    polyethylene glycol (MIRALAX / GLYCOLAX) packet Take 17 g by mouth daily as needed for mild constipation.    sertraline (ZOLOFT) 100 MG tablet Take 2 tablets (200 mg total) by mouth daily. Qty: 60 tablet, Refills: 2    SUMAtriptan (IMITREX) 100 MG tablet Take 100 mg by mouth as needed. foir migraine    tamoxifen (NOLVADEX) 20 MG tablet Take 1 tablet (20 mg total) by mouth daily. Qty: 30 tablet, Refills: 2    zolpidem (AMBIEN) 10 MG tablet Take 1 tablet (10 mg total) by mouth at bedtime as needed for sleep. Qty: 30 tablet, Refills: 3   Associated Diagnoses: Breast  cancer, stage 4, unspecified laterality      STOP taking these medications     calcium citrate-vitamin D (CITRACAL+D) 315-200 MG-UNIT per tablet        No Known Allergies Follow-up Information    Follow up with Molli Hazard, MD.   Specialties:  Hematology and Oncology, Oncology   Why:  Follow up as scheduled   Contact information:   [address] [place] [address] [phone]        The results of significant diagnostics from this hospitalization (including imaging, microbiology, ancillary and laboratory) are listed below for reference.    Significant Diagnostic Studies: Dg Chest 2 View  06/18/2014   CLINICAL DATA:  Dizziness, nausea, weakness, fever, and diarrhea. Patient has breast, liver, and bone cancer newly  diagnosed. First chemotherapy on Tuesday.  EXAM: CHEST  2 VIEW  COMPARISON:  CT chest 05/16/2014  FINDINGS: Power port type right central venous catheter with tip over the upper SVC region. No pneumothorax. Postoperative changes in the lower cervical spine. Normal heart size and pulmonary vascularity. No focal airspace disease or consolidation in the lungs. No blunting of costophrenic angles. No pneumothorax. Mediastinal contours appear intact.  IMPRESSION: No active cardiopulmonary disease.   Electronically Signed   By: Lucienne Capers M.D.   On: 06/18/2014 00:20   Mr Thoracic Spine W Wo Contrast  05/30/2014   CLINICAL DATA:  Breast cancer with metastatic disease to the liver.  EXAM: MRI THORACIC SPINE WITHOUT AND WITH CONTRAST  TECHNIQUE: Multiplanar and multiecho pulse sequences of the thoracic spine were obtained without and with intravenous contrast.  CONTRAST:  20 mL MultiHance  COMPARISON:  PET scan 05/16/2014.  CT the chest 05/16/2014.  FINDINGS: Normal signal is present in the thoracic spinal cord to the level the conus medullaris which terminates at L1-2. Hyperintensity at C7 on the postcontrast images may reflect some degree of artifact given the adjacent hardware at C5-6. There are scattered enhancing metastases throughout the thoracic spine and proximal ribs. The largest lesions are inferiorly on the right at T8 and on the right at T11 and T12. Vertebral body heights are maintained.  A shallow disc protrusion is present at T2-3 and T3-4. Alignment is anatomic. There is no significant focal protrusion scratch the there is no focal stenosis. The foramina are patent.  Extensive enhancing lesions are present throughout the liver.  IMPRESSION: 1. Diffuse metastatic disease of the thoracic spine without evidence for collapse or extraosseous tumor. 2. Normal appearance of the thoracic spinal cord. No canal or foraminal stenosis is present. 3. Extensive metastatic disease throughout the liver. 4.  Postsurgical changes in the lower cervical spine with anterior fusion at C5-6.   Electronically Signed   By: Lawrence Santiago M.D.   On: 05/30/2014 18:39   Mr Lumbar Spine W Wo Contrast  05/30/2014   CLINICAL DATA:  Breast cancer with metastatic disease to the liver and spine.  EXAM: MRI LUMBAR SPINE WITHOUT AND WITH CONTRAST  TECHNIQUE: Multiplanar and multiecho pulse sequences of the lumbar spine were obtained without and with intravenous contrast.  CONTRAST:  5mL MULTIHANCE GADOBENATE DIMEGLUMINE 529 MG/ML IV SOLN  COMPARISON:  PET scan 05/16/2014.  FINDINGS: Normal signal is present in the conus medullaris which terminates at L1, within normal limits. There is no significant enhancement of meninges or nerve roots.  Limited imaging the abdomen is unremarkable.  L1-2: A left paramedian central disc protrusion is present without significant stenosis.  L2-3:  Negative.  L3-4:  Negative.  L4-5:  Mild disc bulging is present without significant stenosis.  L5-S1:  Negative.  Scattered enhancing metastases are present throughout the thoracolumbar spine. A 7 mm lesion is present within the spinous process at T12. A lesion involving the anterior right side of the L4 vertebral body measures 18 mm in cephalo caudad dimension.  A prominent lesion within the spinous process of S1 measures 12 x 13 mm on the sagittal images. There is a prominent metastasis involving the left side of the sacrum at S2-3. There is no extraosseous tumor or encroachment on the sacral nerve roots.  IMPRESSION: 1. Extensive osseous metastases without focal extraosseous tumor or nerve root encroachment. 2. Prominent enhancing tumor within the left paramedian S2 and S3 vertebral bodies. 3. 18 mm tumor along the right side of the L4 vertebral body. 4. 13 mm tumor within the spinous process of S1. 5. Left paramedian disc protrusion without significant stenosis at L1-2. 6. Mild disc bulging at L4-5 without significant stenosis.   Electronically Signed    By: Lawrence Santiago M.D.   On: 05/30/2014 18:52   Mr Breast Bilateral W Wo Contrast  05/21/2014   CLINICAL DATA:  Metastatic disease to the liver. Search for unknown primary tumor.  LABS:  BUN/creatinine 22/0.84 with GFR 80.  EXAM: BILATERAL BREAST MRI WITH AND WITHOUT CONTRAST  TECHNIQUE: Multiplanar, multisequence MR images of both breasts were obtained prior to and following the intravenous administration of 9ml of MultiHance.  THREE-DIMENSIONAL MR IMAGE RENDERING ON INDEPENDENT WORKSTATION:  Three-dimensional MR images were rendered by post-processing of the original MR data on an independent workstation. The three-dimensional MR images were interpreted, and findings are reported in the following complete MRI report for this study. Three dimensional images were evaluated at the independent DynaCad workstation  COMPARISON:  Previous exams  FINDINGS: Breast composition: b.  Scattered fibroglandular tissue.  Background parenchymal enhancement: Mild.  Right breast: No mass or abnormal enhancement. Port-A-Cath over the medial right breast.  Left breast: No mass or abnormal enhancement.  Lymph nodes: No abnormal appearing lymph nodes.  Ancillary findings: Numerous liver masses compatible with known metastatic liver disease. Mass over the anterior superior aspect of the spleen as cannot exclude metastatic disease.  IMPRESSION: No suspicious masses or enhancement within the breasts. No axillary adenopathy.  Evidence of known metastatic liver disease. Possible mass over the anterior superior spleen as cannot exclude metastatic disease.  RECOMMENDATION: Recommend continued management per clinical treatment plan.  BI-RADS CATEGORY  6: Known biopsy-proven malignancy.   Electronically Signed   By: Marin Olp M.D.   On: 05/21/2014 15:54    Microbiology: Recent Results (from the past 240 hour(s))  Blood culture (routine x 2)     Status: None (Preliminary result)   Collection Time: 06/18/14 12:54 AM  Result Value  Ref Range Status   Specimen Description BLOOD RIGHT HAND  Final   Special Requests BOTTLES DRAWN AEROBIC AND ANAEROBIC 6CC  Final   Culture NO GROWTH 1 DAY  Final   Report Status PENDING  Incomplete  Blood culture (routine x 2)     Status: None (Preliminary result)   Collection Time: 06/18/14 12:58 AM  Result Value Ref Range Status   Specimen Description BLOOD LEFT ANTECUBITAL  Final   Special Requests BOTTLES DRAWN AEROBIC AND ANAEROBIC [place]  Final   Culture NO GROWTH 1 DAY  Final   Report Status PENDING  Incomplete  Clostridium Difficile by PCR     Status: None   Collection Time: 06/18/14  12:10 PM  Result Value Ref Range Status   C difficile by pcr NEGATIVE NEGATIVE Final     Labs: Basic Metabolic Panel:  Recent Labs Lab 06/17/14 2350 06/18/14 0411 06/19/14 0534  NA 135 136 141  K 3.9 3.8 3.8  CL 106 106 109  CO2 27 26 25   GLUCOSE 118* 115* 95  BUN 12 13 7   CREATININE 0.68 0.68 0.68  CALCIUM 8.4 8.1* 7.9*   Liver Function Tests:  Recent Labs Lab 06/18/14 0411  AST 27  ALT 25  ALKPHOS 152*  BILITOT 0.6  PROT 6.0  ALBUMIN 2.8*   No results for input(s): LIPASE, AMYLASE in the last 168 hours. No results for input(s): AMMONIA in the last 168 hours. CBC:  Recent Labs Lab 06/17/14 2350 06/18/14 0411 06/19/14 0534  WBC 3.6* 4.8 24.3*  NEUTROABS 0.6* 0.9*  --   HGB 11.3* 11.2* 10.3*  HCT 35.6* 35.1* 32.6*  MCV 93.7 93.9 94.8  PLT 175 172 182   Cardiac Enzymes: No results for input(s): CKTOTAL, CKMB, CKMBINDEX, TROPONINI in the last 168 hours. BNP: BNP (last 3 results) No results for input(s): BNP in the last 8760 hours.  ProBNP (last 3 results) No results for input(s): PROBNP in the last 8760 hours.  CBG: No results for input(s): GLUCAP in the last 168 hours.     Signed:  Euell Schiff  [facility] 06/19/2014, 12:39 PM

## 2014-06-20 ENCOUNTER — Ambulatory Visit (HOSPITAL_COMMUNITY): Payer: 59 | Admitting: Hematology & Oncology

## 2014-06-20 LAB — GI PATHOGEN PANEL BY PCR, STOOL
C difficile toxin A/B: NOT DETECTED
CRYPTOSPORIDIUM BY PCR: NOT DETECTED
Campylobacter by PCR: NOT DETECTED
E coli (ETEC) LT/ST: NOT DETECTED
E coli (STEC): NOT DETECTED
E coli 0157 by PCR: NOT DETECTED
G lamblia by PCR: NOT DETECTED
NOROVIRUS G1/G2: NOT DETECTED
Rotavirus A by PCR: NOT DETECTED
Salmonella by PCR: NOT DETECTED
Shigella by PCR: NOT DETECTED

## 2014-06-22 LAB — STOOL CULTURE

## 2014-06-23 LAB — CULTURE, BLOOD (ROUTINE X 2)
Culture: NO GROWTH
Culture: NO GROWTH

## 2014-06-26 NOTE — Assessment & Plan Note (Signed)
Pleasant 49 year old pre-menopausal female who presented with widespread metastases to liver and bone. Pathology was ER positive, HER-2 positive and consistent with breast primary. No disease has been found within the breast or either axilla. She does have a history of a reduction several years ago.  We initially started her on tamoxifen, Herceptin, and Perjeta with the hopes we would achieve reasonable disease control. She however continues to have pain and we have now opted to add in Taxotere. I am not going to discontinue her tamoxifen and we will leave her on this at this point. I am going to refer her to Dr. Fatima Sanger for consideration of radiation to the spine as back pain continues to be a major issue for her. Her abdominal pain is significantly improved. We will continue to get her off of her dexamethasone except as premeds. We will plan on seeing her back again in 3 weeks with her next cycle of therapy. I again reviewed side effects and symptoms of concern. We discussed fever in the setting of chemotherapy in detail. She knows to call with interim problems or concerns.

## 2014-06-27 ENCOUNTER — Ambulatory Visit (HOSPITAL_COMMUNITY): Payer: 59

## 2014-06-28 ENCOUNTER — Other Ambulatory Visit (HOSPITAL_COMMUNITY): Payer: Self-pay | Admitting: *Deleted

## 2014-06-28 ENCOUNTER — Encounter (HOSPITAL_BASED_OUTPATIENT_CLINIC_OR_DEPARTMENT_OTHER): Payer: 59

## 2014-06-28 ENCOUNTER — Other Ambulatory Visit (HOSPITAL_COMMUNITY): Payer: Self-pay | Admitting: Hematology & Oncology

## 2014-06-28 ENCOUNTER — Ambulatory Visit (HOSPITAL_COMMUNITY)
Admission: RE | Admit: 2014-06-28 | Discharge: 2014-06-28 | Disposition: A | Discharge status: Home / Self Care | Payer: 59 | Source: Ambulatory Visit | Attending: Hematology & Oncology | Admitting: Hematology & Oncology

## 2014-06-28 DIAGNOSIS — C7951 Secondary malignant neoplasm of bone: Secondary | ICD-10-CM

## 2014-06-28 DIAGNOSIS — C50919 Malignant neoplasm of unspecified site of unspecified female breast: Secondary | ICD-10-CM | POA: Diagnosis not present

## 2014-06-28 DIAGNOSIS — C787 Secondary malignant neoplasm of liver and intrahepatic bile duct: Secondary | ICD-10-CM | POA: Insufficient documentation

## 2014-06-28 DIAGNOSIS — C50812 Malignant neoplasm of overlapping sites of left female breast: Secondary | ICD-10-CM

## 2014-06-28 DIAGNOSIS — M25512 Pain in left shoulder: Secondary | ICD-10-CM | POA: Diagnosis present

## 2014-06-28 DIAGNOSIS — C229 Malignant neoplasm of liver, not specified as primary or secondary: Secondary | ICD-10-CM | POA: Diagnosis not present

## 2014-06-28 LAB — COMPREHENSIVE METABOLIC PANEL
ALBUMIN: 3.4 g/dL — AB (ref 3.5–5.2)
ALT: 19 U/L (ref 0–35)
AST: 24 U/L (ref 0–37)
Alkaline Phosphatase: 129 U/L — ABNORMAL HIGH (ref 39–117)
BILIRUBIN TOTAL: 0.5 mg/dL (ref 0.3–1.2)
BUN: 13 mg/dL (ref 6–23)
CALCIUM: 8.3 mg/dL — AB (ref 8.4–10.5)
CO2: 29 mmol/L (ref 19–32)
CREATININE: 0.62 mg/dL (ref 0.50–1.10)
Chloride: 107 mmol/L (ref 96–112)
GFR calc Af Amer: >90 mL/min (ref >90)
Glucose, Bld: 113 mg/dL — ABNORMAL HIGH (ref 70–99)
Potassium: 4.1 mmol/L (ref 3.5–5.1)
Sodium: 138 mmol/L (ref 135–145)
TOTAL PROTEIN: 6.4 g/dL (ref 6.0–8.3)

## 2014-06-28 MED ORDER — DENOSUMAB 120 MG/1.7ML ~~LOC~~ SOLN
120.0000 mg | Freq: Once | SUBCUTANEOUS | Status: AC
Start: 2014-06-28 — End: 2014-06-28
  Administered 2014-06-28: 120 mg via SUBCUTANEOUS
  Filled 2014-06-28: qty 1.7

## 2014-06-28 NOTE — Progress Notes (Signed)
[name] presents today for injection per MD orders. XGEVA 120mg  administered SQ in left Abdomen. Administration without incident. Patient tolerated well.

## 2014-06-28 NOTE — Progress Notes (Signed)
Labs for cmp

## 2014-06-29 ENCOUNTER — Other Ambulatory Visit (HOSPITAL_COMMUNITY): Payer: Self-pay | Admitting: Hematology & Oncology

## 2014-07-03 ENCOUNTER — Encounter (HOSPITAL_BASED_OUTPATIENT_CLINIC_OR_DEPARTMENT_OTHER): Payer: 59

## 2014-07-03 ENCOUNTER — Ambulatory Visit (HOSPITAL_COMMUNITY): Payer: 59

## 2014-07-03 ENCOUNTER — Encounter (HOSPITAL_BASED_OUTPATIENT_CLINIC_OR_DEPARTMENT_OTHER): Payer: 59 | Admitting: Hematology & Oncology

## 2014-07-03 ENCOUNTER — Encounter (HOSPITAL_COMMUNITY): Payer: Self-pay | Admitting: Lab

## 2014-07-03 ENCOUNTER — Encounter (HOSPITAL_COMMUNITY): Payer: Self-pay | Admitting: Hematology & Oncology

## 2014-07-03 VITALS — BP 120/66 | HR 107 | Temp 98.2°F | Resp 16 | Wt 248.0 lb

## 2014-07-03 DIAGNOSIS — C50919 Malignant neoplasm of unspecified site of unspecified female breast: Secondary | ICD-10-CM

## 2014-07-03 DIAGNOSIS — C50812 Malignant neoplasm of overlapping sites of left female breast: Secondary | ICD-10-CM

## 2014-07-03 DIAGNOSIS — M545 Low back pain: Secondary | ICD-10-CM

## 2014-07-03 DIAGNOSIS — C7951 Secondary malignant neoplasm of bone: Secondary | ICD-10-CM

## 2014-07-03 DIAGNOSIS — C229 Malignant neoplasm of liver, not specified as primary or secondary: Secondary | ICD-10-CM

## 2014-07-03 DIAGNOSIS — Z5112 Encounter for antineoplastic immunotherapy: Secondary | ICD-10-CM

## 2014-07-03 DIAGNOSIS — C787 Secondary malignant neoplasm of liver and intrahepatic bile duct: Secondary | ICD-10-CM

## 2014-07-03 LAB — CBC WITH DIFFERENTIAL/PLATELET
BASOS PCT: 0 % (ref 0–1)
Basophils Absolute: 0 10*3/uL (ref 0.0–0.1)
Eosinophils Absolute: 0 10*3/uL (ref 0.0–0.7)
Eosinophils Relative: 0 % (ref 0–5)
HCT: 36.4 % (ref 36.0–46.0)
Hemoglobin: 11.6 g/dL — ABNORMAL LOW (ref 12.0–15.0)
LYMPHS PCT: 7 % — AB (ref 12–46)
Lymphs Abs: 0.3 10*3/uL — ABNORMAL LOW (ref 0.7–4.0)
MCH: 30.4 pg (ref 26.0–34.0)
MCHC: 31.9 g/dL (ref 30.0–36.0)
MCV: 95.5 fL (ref 78.0–100.0)
MONOS PCT: 1 % — AB (ref 3–12)
Monocytes Absolute: 0 10*3/uL — ABNORMAL LOW (ref 0.1–1.0)
NEUTROS ABS: 4.3 10*3/uL (ref 1.7–7.7)
NEUTROS PCT: 92 % — AB (ref 43–77)
Platelets: 292 10*3/uL (ref 150–400)
RBC: 3.81 MIL/uL — ABNORMAL LOW (ref 3.87–5.11)
RDW: 17 % — ABNORMAL HIGH (ref 11.5–15.5)
WBC: 4.6 10*3/uL (ref 4.0–10.5)

## 2014-07-03 LAB — COMPREHENSIVE METABOLIC PANEL
ALK PHOS: 112 U/L (ref 39–117)
ALT: 18 U/L (ref 0–35)
AST: 23 U/L (ref 0–37)
Albumin: 3.5 g/dL (ref 3.5–5.2)
Anion gap: 5 (ref 5–15)
BUN: 11 mg/dL (ref 6–23)
CALCIUM: 8.7 mg/dL (ref 8.4–10.5)
CO2: 24 mmol/L (ref 19–32)
Chloride: 106 mmol/L (ref 96–112)
Creatinine, Ser: 0.64 mg/dL (ref 0.50–1.10)
GFR calc Af Amer: >90 mL/min (ref >90)
GFR calc non Af Amer: >90 mL/min (ref >90)
Glucose, Bld: 241 mg/dL — ABNORMAL HIGH (ref 70–99)
POTASSIUM: 3.8 mmol/L (ref 3.5–5.1)
Sodium: 135 mmol/L (ref 135–145)
Total Bilirubin: 0.6 mg/dL (ref 0.3–1.2)
Total Protein: 6.8 g/dL (ref 6.0–8.3)

## 2014-07-03 MED ORDER — MORPHINE SULFATE ER 15 MG PO TBCR: 15.0000 mg | EXTENDED_RELEASE_TABLET | Freq: Two times a day (BID) | ORAL | Status: DC

## 2014-07-03 MED ORDER — SODIUM CHLORIDE 0.9 % IJ SOLN: 10.0000 mL | INTRAMUSCULAR | Status: DC | PRN

## 2014-07-03 MED ORDER — HEPARIN SOD (PORK) LOCK FLUSH 100 UNIT/ML IV SOLN
500.0000 [IU] | Freq: Once | INTRAVENOUS | Status: AC | PRN
  Administered 2014-07-03: 500 [IU]

## 2014-07-03 MED ORDER — DIPHENHYDRAMINE HCL 25 MG PO CAPS
50.0000 mg | ORAL_CAPSULE | Freq: Once | ORAL | Status: AC
  Administered 2014-07-03: 50 mg via ORAL
  Filled 2014-07-03: qty 2

## 2014-07-03 MED ORDER — SODIUM CHLORIDE 0.9 % IV SOLN
Freq: Once | INTRAVENOUS | Status: AC
  Administered 2014-07-03: 11:00:00 via INTRAVENOUS

## 2014-07-03 MED ORDER — SUMATRIPTAN SUCCINATE 100 MG PO TABS: 100.0000 mg | ORAL_TABLET | ORAL | Status: DC | PRN

## 2014-07-03 MED ORDER — ACETAMINOPHEN 325 MG PO TABS
650.0000 mg | ORAL_TABLET | Freq: Once | ORAL | Status: AC
Start: 2014-07-03 — End: 2014-07-03
  Administered 2014-07-03: 650 mg via ORAL

## 2014-07-03 MED ORDER — SODIUM CHLORIDE 0.9 % IV SOLN
420.0000 mg | Freq: Once | INTRAVENOUS | Status: AC
  Administered 2014-07-03: 420 mg via INTRAVENOUS
  Filled 2014-07-03: qty 14

## 2014-07-03 MED ORDER — TRASTUZUMAB CHEMO INJECTION 440 MG
6.0000 mg/kg | Freq: Once | INTRAVENOUS | Status: AC
  Administered 2014-07-03: 672 mg via INTRAVENOUS
  Filled 2014-07-03: qty 32

## 2014-07-03 NOTE — Patient Instructions (Signed)
[facility] Discharge Instructions for Patients Receiving Chemotherapy  Today you received the following chemotherapy agents herceptin and perjeta.  We held your taxotere today.  We will make a referral to a genetic counselor.  Call the clinic if you have any questions or concerns  To help prevent nausea and vomiting after your treatment, we encourage you to take your nausea medication   If you develop nausea and vomiting that is not controlled by your nausea medication, call the clinic. If it is after clinic hours your family physician or the after hours number for the clinic or go to the Emergency Department.   BELOW ARE SYMPTOMS THAT SHOULD BE REPORTED IMMEDIATELY:  *FEVER GREATER THAN 101.0 F  *CHILLS WITH OR WITHOUT FEVER  NAUSEA AND VOMITING THAT IS NOT CONTROLLED WITH YOUR NAUSEA MEDICATION  *UNUSUAL SHORTNESS OF BREATH  *UNUSUAL BRUISING OR BLEEDING  TENDERNESS IN MOUTH AND THROAT WITH OR WITHOUT PRESENCE OF ULCERS  *URINARY PROBLEMS  *BOWEL PROBLEMS  UNUSUAL RASH Items with * indicate a potential emergency and should be followed up as soon as possible.  One of the nurses will contact you 24 hours after your treatment. Please let the nurse know about any problems that you may have experienced. Feel free to call the clinic you have any questions or concerns. The clinic phone number is [phone].   I have been informed and understand all the instructions given to me. I know to contact the clinic, my physician, or go to the Emergency Department if any problems should occur. I do not have any questions at this time, but understand that I may call the clinic during office hours or the Patient Navigator at [phone] should I have any questions or need assistance in obtaining follow up care.

## 2014-07-03 NOTE — Patient Instructions (Signed)
[place] at [facility] Discharge Instructions  RECOMMENDATIONS MADE BY THE CONSULTANT AND ANY TEST RESULTS WILL BE SENT TO YOUR REFERRING PHYSICIAN.  Exam and discussion by Dr. Whitney Muse.  Think the discomfort you are having in your right neck area is related to positioning.  You can alternate heat and ice to to area. Report fevers, chills, uncontrolled nausea, vomiting, diarrhea or other concerns.  We will make a referral for Genetic counseling at [facility] and they will contact you.  Follow-up with MD and chemotherapy in 3 weeks.  Thank you for choosing [place] at [facility] to provide your oncology and hematology care.  To afford each patient quality time with our provider, please arrive at least 15 minutes before your scheduled appointment time.    You need to re-schedule your appointment should you arrive 10 or more minutes late.  We strive to give you quality time with our providers, and arriving late affects you and other patients whose appointments are after yours.  Also, if you no show three or more times for appointments you may be dismissed from the clinic at the providers discretion.     Again, thank you for choosing [facility].  Our hope is that these requests will decrease the amount of time that you wait before being seen by our physicians.       _____________________________________________________________  Should you have questions after your visit to [facility], please contact our office at [phone] between the hours of 8:30 a.m. and 4:30 p.m.  Voicemails left after 4:30 p.m. will not be returned until the following business day.  For prescription refill requests, have your pharmacy contact our office.

## 2014-07-03 NOTE — Progress Notes (Signed)
[name] Tolerated chemotherapy well today.  Discharged ambulatory

## 2014-07-03 NOTE — Progress Notes (Signed)
Referral sent to Genetics at [facility].  Faxed on 2/23

## 2014-07-04 ENCOUNTER — Telehealth: Payer: Self-pay | Admitting: Genetic Counselor

## 2014-07-04 LAB — CANCER ANTIGEN 27.29: CA 27.29: 2524.7 U/mL — AB (ref 0.0–38.6)

## 2014-07-04 NOTE — Telephone Encounter (Signed)
Lt mess regarding appt on 07/26/14 at 2:00 w/ Genetics Dx Genetic testing Referring [facility]

## 2014-07-05 ENCOUNTER — Telehealth: Payer: Self-pay | Admitting: Genetic Counselor

## 2014-07-05 ENCOUNTER — Other Ambulatory Visit (HOSPITAL_COMMUNITY): Payer: Self-pay | Admitting: *Deleted

## 2014-07-05 ENCOUNTER — Ambulatory Visit (HOSPITAL_COMMUNITY): Payer: 59

## 2014-07-05 DIAGNOSIS — C50919 Malignant neoplasm of unspecified site of unspecified female breast: Secondary | ICD-10-CM

## 2014-07-05 MED ORDER — PROCHLORPERAZINE MALEATE 10 MG PO TABS: 10.0000 mg | ORAL_TABLET | Freq: Four times a day (QID) | ORAL | Status: DC | PRN

## 2014-07-05 NOTE — Telephone Encounter (Signed)
pt need to reschedule appt due to other doctors appt and now will come in on the 07/23/14 at 11am for Genetic

## 2014-07-06 ENCOUNTER — Encounter (HOSPITAL_COMMUNITY): Payer: Self-pay

## 2014-07-06 ENCOUNTER — Encounter (HOSPITAL_COMMUNITY): Payer: Self-pay | Admitting: *Deleted

## 2014-07-06 ENCOUNTER — Encounter (HOSPITAL_BASED_OUTPATIENT_CLINIC_OR_DEPARTMENT_OTHER): Payer: 59

## 2014-07-06 VITALS — BP 117/56 | HR 75 | Temp 98.0°F | Resp 18 | Wt 245.0 lb

## 2014-07-06 DIAGNOSIS — C50812 Malignant neoplasm of overlapping sites of left female breast: Secondary | ICD-10-CM

## 2014-07-06 DIAGNOSIS — C50919 Malignant neoplasm of unspecified site of unspecified female breast: Secondary | ICD-10-CM

## 2014-07-06 DIAGNOSIS — R1114 Bilious vomiting: Secondary | ICD-10-CM

## 2014-07-06 DIAGNOSIS — C229 Malignant neoplasm of liver, not specified as primary or secondary: Secondary | ICD-10-CM | POA: Diagnosis not present

## 2014-07-06 DIAGNOSIS — R112 Nausea with vomiting, unspecified: Secondary | ICD-10-CM

## 2014-07-06 DIAGNOSIS — R197 Diarrhea, unspecified: Secondary | ICD-10-CM

## 2014-07-06 DIAGNOSIS — R638 Other symptoms and signs concerning food and fluid intake: Secondary | ICD-10-CM

## 2014-07-06 LAB — CLOSTRIDIUM DIFFICILE BY PCR: Toxigenic C. Difficile by PCR: NEGATIVE

## 2014-07-06 MED ORDER — SODIUM CHLORIDE 0.9 % IV SOLN: INTRAVENOUS | Status: DC

## 2014-07-06 MED ORDER — LORAZEPAM 0.5 MG PO TABS: 0.5000 mg | ORAL_TABLET | Freq: Four times a day (QID) | ORAL | Status: DC | PRN

## 2014-07-06 MED ORDER — SODIUM CHLORIDE 0.9 % IV SOLN
8.0000 mg | Freq: Once | INTRAVENOUS | Status: AC
  Administered 2014-07-06: 8 mg via INTRAVENOUS
  Filled 2014-07-06: qty 4

## 2014-07-06 MED ORDER — HEPARIN SOD (PORK) LOCK FLUSH 100 UNIT/ML IV SOLN
INTRAVENOUS | Status: AC
  Filled 2014-07-06: qty 5

## 2014-07-06 MED ORDER — LORAZEPAM 2 MG/ML IJ SOLN
0.5000 mg | Freq: Once | INTRAMUSCULAR | Status: AC
  Administered 2014-07-06: 0.5 mg via INTRAVENOUS
  Filled 2014-07-06: qty 1

## 2014-07-06 MED ORDER — POTASSIUM CHLORIDE IN NACL 20-0.9 MEQ/L-% IV SOLN
INTRAVENOUS | Status: AC
  Administered 2014-07-06: 09:00:00 via INTRAVENOUS
  Filled 2014-07-06 (×16): qty 1000

## 2014-07-06 MED ORDER — HEPARIN SOD (PORK) LOCK FLUSH 100 UNIT/ML IV SOLN
500.0000 [IU] | Freq: Once | INTRAVENOUS | Status: AC
  Administered 2014-07-06: 500 [IU] via INTRAVENOUS

## 2014-07-06 NOTE — Addendum Note (Signed)
Addended by: Gerhard Perches on: 07/06/2014 12:01 PM   Modules accepted: Orders, Medications

## 2014-07-06 NOTE — Progress Notes (Signed)
[name] Tolerated IV fluids and nausea medication well today.  Felt much better before discharge.  Discharged ambulatory

## 2014-07-06 NOTE — Patient Instructions (Signed)
[place] at [facility]  Discharge Instructions:  You had a liter of fluids today along with zofran and ativan for nausea.  Please call the clinic if you have any questions or concerns.  Hope you feel better. _______________________________________________________________  Thank you for choosing [place] at [facility] to provide your oncology and hematology care.  To afford each patient quality time with our providers, please arrive at least 15 minutes before your scheduled appointment.  You need to re-schedule your appointment if you arrive 10 or more minutes late.  We strive to give you quality time with our providers, and arriving late affects you and other patients whose appointments are after yours.  Also, if you no show three or more times for appointments you may be dismissed from the clinic.  Again, thank you for choosing [place] at [place] hope is that these requests will allow you access to exceptional care and in a timely manner. _______________________________________________________________  If you have questions after your visit, please contact our office at [phone] between the hours of 8:30 a.m. and 5:00 p.m. Voicemails left after 4:30 p.m. will not be returned until the following business day. _______________________________________________________________  For prescription refill requests, have your pharmacy contact our office. _______________________________________________________________  Recommendations made by the consultant and any test results will be sent to your referring physician. _______________________________________________________________

## 2014-07-08 ENCOUNTER — Emergency Department (HOSPITAL_COMMUNITY)
Admission: EM | Admit: 2014-07-08 | Discharge: 2014-07-08 | Disposition: A | Discharge status: Home / Self Care | Payer: 59 | Attending: Emergency Medicine | Admitting: Emergency Medicine

## 2014-07-08 ENCOUNTER — Encounter (HOSPITAL_COMMUNITY): Payer: Self-pay | Admitting: Emergency Medicine

## 2014-07-08 ENCOUNTER — Emergency Department (HOSPITAL_COMMUNITY): Payer: 59

## 2014-07-08 DIAGNOSIS — Z9049 Acquired absence of other specified parts of digestive tract: Secondary | ICD-10-CM | POA: Insufficient documentation

## 2014-07-08 DIAGNOSIS — R197 Diarrhea, unspecified: Secondary | ICD-10-CM | POA: Diagnosis present

## 2014-07-08 DIAGNOSIS — R109 Unspecified abdominal pain: Secondary | ICD-10-CM

## 2014-07-08 DIAGNOSIS — F419 Anxiety disorder, unspecified: Secondary | ICD-10-CM | POA: Insufficient documentation

## 2014-07-08 DIAGNOSIS — Z853 Personal history of malignant neoplasm of breast: Secondary | ICD-10-CM | POA: Insufficient documentation

## 2014-07-08 DIAGNOSIS — Z87891 Personal history of nicotine dependence: Secondary | ICD-10-CM | POA: Diagnosis not present

## 2014-07-08 DIAGNOSIS — Z90710 Acquired absence of both cervix and uterus: Secondary | ICD-10-CM | POA: Insufficient documentation

## 2014-07-08 DIAGNOSIS — E876 Hypokalemia: Secondary | ICD-10-CM | POA: Diagnosis not present

## 2014-07-08 DIAGNOSIS — Z9889 Other specified postprocedural states: Secondary | ICD-10-CM | POA: Insufficient documentation

## 2014-07-08 DIAGNOSIS — Z85830 Personal history of malignant neoplasm of bone: Secondary | ICD-10-CM | POA: Insufficient documentation

## 2014-07-08 DIAGNOSIS — D649 Anemia, unspecified: Secondary | ICD-10-CM | POA: Diagnosis not present

## 2014-07-08 DIAGNOSIS — F329 Major depressive disorder, single episode, unspecified: Secondary | ICD-10-CM | POA: Insufficient documentation

## 2014-07-08 DIAGNOSIS — K529 Noninfective gastroenteritis and colitis, unspecified: Secondary | ICD-10-CM | POA: Diagnosis not present

## 2014-07-08 LAB — COMPREHENSIVE METABOLIC PANEL WITH GFR
ALT: 24 U/L (ref 0–35)
AST: 23 U/L (ref 0–37)
Albumin: 3.3 g/dL — ABNORMAL LOW (ref 3.5–5.2)
Alkaline Phosphatase: 98 U/L (ref 39–117)
Anion gap: 3 — ABNORMAL LOW (ref 5–15)
BUN: 11 mg/dL (ref 6–23)
CO2: 27 mmol/L (ref 19–32)
Calcium: 8 mg/dL — ABNORMAL LOW (ref 8.4–10.5)
Chloride: 105 mmol/L (ref 96–112)
Creatinine, Ser: 0.7 mg/dL (ref 0.50–1.10)
GFR calc Af Amer: >90 mL/min
GFR calc non Af Amer: >90 mL/min
Glucose, Bld: 132 mg/dL — ABNORMAL HIGH (ref 70–99)
Potassium: 3.1 mmol/L — ABNORMAL LOW (ref 3.5–5.1)
Sodium: 135 mmol/L (ref 135–145)
Total Bilirubin: 0.6 mg/dL (ref 0.3–1.2)
Total Protein: 6.2 g/dL (ref 6.0–8.3)

## 2014-07-08 LAB — CBC WITH DIFFERENTIAL/PLATELET
BASOS ABS: 0 10*3/uL (ref 0.0–0.1)
BASOS PCT: 0 % (ref 0–1)
EOS PCT: 1 % (ref 0–5)
Eosinophils Absolute: 0.1 10*3/uL (ref 0.0–0.7)
HCT: 36 % (ref 36.0–46.0)
HEMOGLOBIN: 11.5 g/dL — AB (ref 12.0–15.0)
LYMPHS PCT: 11 % — AB (ref 12–46)
Lymphs Abs: 0.8 10*3/uL (ref 0.7–4.0)
MCH: 30.6 pg (ref 26.0–34.0)
MCHC: 31.9 g/dL (ref 30.0–36.0)
MCV: 95.7 fL (ref 78.0–100.0)
MONO ABS: 0.6 10*3/uL (ref 0.1–1.0)
Monocytes Relative: 8 % (ref 3–12)
NEUTROS ABS: 5.7 10*3/uL (ref 1.7–7.7)
NEUTROS PCT: 80 % — AB (ref 43–77)
Platelets: 170 10*3/uL (ref 150–400)
RBC: 3.76 MIL/uL — AB (ref 3.87–5.11)
RDW: 17.3 % — ABNORMAL HIGH (ref 11.5–15.5)
WBC: 7.1 10*3/uL (ref 4.0–10.5)

## 2014-07-08 LAB — I-STAT CG4 LACTIC ACID, ED: Lactic Acid, Venous: 1.38 mmol/L (ref 0.5–2.0)

## 2014-07-08 MED ORDER — METRONIDAZOLE 500 MG PO TABS
500.0000 mg | ORAL_TABLET | Freq: Once | ORAL | Status: AC
  Administered 2014-07-08: 500 mg via ORAL
  Filled 2014-07-08: qty 1

## 2014-07-08 MED ORDER — ONDANSETRON HCL 4 MG/2ML IJ SOLN
4.0000 mg | Freq: Once | INTRAMUSCULAR | Status: AC
  Administered 2014-07-08: 4 mg via INTRAVENOUS
  Filled 2014-07-08: qty 2

## 2014-07-08 MED ORDER — MORPHINE SULFATE 4 MG/ML IJ SOLN
4.0000 mg | Freq: Once | INTRAMUSCULAR | Status: AC
  Administered 2014-07-08: 4 mg via INTRAVENOUS

## 2014-07-08 MED ORDER — IOHEXOL 300 MG/ML  SOLN
100.0000 mL | Freq: Once | INTRAMUSCULAR | Status: AC | PRN
  Administered 2014-07-08: 100 mL via INTRAVENOUS

## 2014-07-08 MED ORDER — POTASSIUM CHLORIDE CRYS ER 20 MEQ PO TBCR: 20.0000 meq | EXTENDED_RELEASE_TABLET | Freq: Two times a day (BID) | ORAL | Status: DC

## 2014-07-08 MED ORDER — SODIUM CHLORIDE 0.9 % IV SOLN: 1000.0000 mL | INTRAVENOUS | Status: DC

## 2014-07-08 MED ORDER — METRONIDAZOLE 500 MG PO TABS: 500.0000 mg | ORAL_TABLET | Freq: Three times a day (TID) | ORAL | Status: DC

## 2014-07-08 MED ORDER — SODIUM CHLORIDE 0.9 % IV SOLN
1000.0000 mL | Freq: Once | INTRAVENOUS | Status: AC
  Administered 2014-07-08: 1000 mL via INTRAVENOUS

## 2014-07-08 MED ORDER — IOHEXOL 300 MG/ML  SOLN
50.0000 mL | Freq: Once | INTRAMUSCULAR | Status: AC | PRN
  Administered 2014-07-08: 50 mL via ORAL

## 2014-07-08 MED ORDER — METOCLOPRAMIDE HCL 5 MG/ML IJ SOLN
10.0000 mg | Freq: Once | INTRAMUSCULAR | Status: AC
  Administered 2014-07-08: 10 mg via INTRAVENOUS

## 2014-07-08 MED ORDER — MORPHINE SULFATE 4 MG/ML IJ SOLN
INTRAMUSCULAR | Status: AC
  Filled 2014-07-08: qty 1

## 2014-07-08 MED ORDER — SODIUM CHLORIDE 0.9 % IJ SOLN
INTRAMUSCULAR | Status: AC
  Filled 2014-07-08: qty 30

## 2014-07-08 MED ORDER — CIPROFLOXACIN HCL 250 MG PO TABS
500.0000 mg | ORAL_TABLET | Freq: Once | ORAL | Status: AC
  Administered 2014-07-08: 500 mg via ORAL
  Filled 2014-07-08: qty 2

## 2014-07-08 MED ORDER — METOCLOPRAMIDE HCL 5 MG/ML IJ SOLN
INTRAMUSCULAR | Status: AC
  Filled 2014-07-08: qty 2

## 2014-07-08 MED ORDER — CIPROFLOXACIN HCL 500 MG PO TABS: 500.0000 mg | ORAL_TABLET | Freq: Two times a day (BID) | ORAL | Status: DC

## 2014-07-08 MED ORDER — MORPHINE SULFATE 4 MG/ML IJ SOLN
4.0000 mg | Freq: Once | INTRAMUSCULAR | Status: AC
  Administered 2014-07-08: 4 mg via INTRAVENOUS
  Filled 2014-07-08: qty 1

## 2014-07-08 MED ORDER — POTASSIUM CHLORIDE CRYS ER 20 MEQ PO TBCR
40.0000 meq | EXTENDED_RELEASE_TABLET | Freq: Once | ORAL | Status: AC
  Administered 2014-07-08: 40 meq via ORAL
  Filled 2014-07-08: qty 2

## 2014-07-08 NOTE — Discharge Instructions (Signed)
Continue taking loperamide as needed for diarrhea. Continue taking morphine and oxycodone as needed for pain. Continue taking ondansetron and chlorpromazine as needed for nausea. Return to the ED if symptoms are worsening. Specifically, return if you start running a fever or have vomiting which his not being controlled with her medication at home. Otherwise, follow up with your PCP.  Diarrhea Diarrhea is frequent loose and watery bowel movements. It can cause you to feel weak and dehydrated. Dehydration can cause you to become tired and thirsty, have a dry mouth, and have decreased urination that often is dark yellow. Diarrhea is a sign of another problem, most often an infection that will not last long. In most cases, diarrhea typically lasts 2-3 days. However, it can last longer if it is a sign of something more serious. It is important to treat your diarrhea as directed by your caregiver to lessen or prevent future episodes of diarrhea. CAUSES  Some common causes include:  Gastrointestinal infections caused by viruses, bacteria, or parasites.  Food poisoning or food allergies.  Certain medicines, such as antibiotics, chemotherapy, and laxatives.  Artificial sweeteners and fructose.  Digestive disorders. HOME CARE INSTRUCTIONS  Ensure adequate fluid intake (hydration): Have 1 cup (8 oz) of fluid for each diarrhea episode. Avoid fluids that contain simple sugars or sports drinks, fruit juices, whole milk products, and sodas. Your urine should be clear or pale yellow if you are drinking enough fluids. Hydrate with an oral rehydration solution that you can purchase at pharmacies, retail stores, and online. You can prepare an oral rehydration solution at home by mixing the following ingredients together:   - tsp table salt.   tsp baking soda.   tsp salt substitute containing potassium chloride.  1  tablespoons sugar.  1 L (34 oz) of water.  Certain foods and beverages may increase the  speed at which food moves through the gastrointestinal (GI) tract. These foods and beverages should be avoided and include:  Caffeinated and alcoholic beverages.  High-fiber foods, such as raw fruits and vegetables, nuts, seeds, and whole grain breads and cereals.  Foods and beverages sweetened with sugar alcohols, such as xylitol, sorbitol, and mannitol.  Some foods may be well tolerated and may help thicken stool including:  Starchy foods, such as rice, toast, pasta, low-sugar cereal, oatmeal, grits, baked potatoes, crackers, and bagels.  Bananas.  Applesauce.  Add probiotic-rich foods to help increase healthy bacteria in the GI tract, such as yogurt and fermented milk products.  Wash your hands well after each diarrhea episode.  Only take over-the-counter or prescription medicines as directed by your caregiver.  Take a warm bath to relieve any burning or pain from frequent diarrhea episodes. SEEK IMMEDIATE MEDICAL CARE IF:   You are unable to keep fluids down.  You have persistent vomiting.  You have blood in your stool, or your stools are black and tarry.  You do not urinate in 6-8 hours, or there is only a small amount of very dark urine.  You have abdominal pain that increases or localizes.  You have weakness, dizziness, confusion, or light-headedness.  You have a severe headache.  Your diarrhea gets worse or does not get better.  You have a fever or persistent symptoms for more than 2-3 days.  You have a fever and your symptoms suddenly get worse. MAKE SURE YOU:   Understand these instructions.  Will watch your condition.  Will get help right away if you are not doing well or get worse.  Document Released: 04/17/2002 Document Revised: 09/11/2013 Document Reviewed: 01/03/2012 [facility] Patient Information 2015 [place], [place]. This information is not intended to replace advice given to you by your health care provider. Make sure you discuss any questions you  have with your health care provider.  Ciprofloxacin tablets What is this medicine? CIPROFLOXACIN (sip roe FLOX a sin) is a quinolone antibiotic. It is used to treat certain kinds of bacterial infections. It will not work for colds, flu, or other viral infections. This medicine may be used for other purposes; ask your health care provider or pharmacist if you have questions. COMMON BRAND NAME(S): Cipro What should I tell my health care provider before I take this medicine? They need to know if you have any of these conditions: -bone problems -cerebral disease -joint problems -irregular heartbeat -kidney disease -liver disease -myasthenia gravis -seizure disorder -tendon problems -an unusual or allergic reaction to ciprofloxacin, other antibiotics or medicines, foods, dyes, or preservatives -pregnant or trying to get pregnant -breast-feeding How should I use this medicine? Take this medicine by mouth with a glass of water. Follow the directions on the prescription label. Take your medicine at regular intervals. Do not take your medicine more often than directed. Take all of your medicine as directed even if you think your are better. Do not skip doses or stop your medicine early. You can take this medicine with food or on an empty stomach. It can be taken with a meal that contains dairy or calcium, but do not take it alone with a dairy product, like milk or yogurt or calcium-fortified juice. A special MedGuide will be given to you by the pharmacist with each prescription and refill. Be sure to read this information carefully each time. Talk to your pediatrician regarding the use of this medicine in children. Special care may be needed. Overdosage: If you think you have taken too much of this medicine contact a poison control center or emergency room at once. NOTE: This medicine is only for you. Do not share this medicine with others. What if I miss a dose? If you miss a dose, take it as  soon as you can. If it is almost time for your next dose, take only that dose. Do not take double or extra doses. What may interact with this medicine? Do not take this medicine with any of the following medications: -cisapride -droperidol -terfenadine -tizanidine This medicine may also interact with the following medications: -antacids -birth control pills -caffeine -cyclosporin -didanosine (ddI) buffered tablets or powder -medicines for diabetes -medicines for inflammation like ibuprofen, naproxen -methotrexate -multivitamins -omeprazole -phenytoin -probenecid -sucralfate -theophylline -warfarin This list may not describe all possible interactions. Give your health care provider a list of all the medicines, herbs, non-prescription drugs, or dietary supplements you use. Also tell them if you smoke, drink alcohol, or use illegal drugs. Some items may interact with your medicine. What should I watch for while using this medicine? Tell your doctor or health care professional if your symptoms do not improve. Do not treat diarrhea with over the counter products. Contact your doctor if you have diarrhea that lasts more than 2 days or if it is severe and watery. You may get drowsy or dizzy. Do not drive, use machinery, or do anything that needs mental alertness until you know how this medicine affects you. Do not stand or sit up quickly, especially if you are an older patient. This reduces the risk of dizzy or fainting spells. This medicine can make you more  sensitive to the sun. Keep out of the sun. If you cannot avoid being in the sun, wear protective clothing and use sunscreen. Do not use sun lamps or tanning beds/booths. Avoid antacids, aluminum, calcium, iron, magnesium, and zinc products for 6 hours before and 2 hours after taking a dose of this medicine. What side effects may I notice from receiving this medicine? Side effects that you should report to your doctor or health care  professional as soon as possible: - allergic reactions like skin rash, itching or hives, swelling of the face, lips, or tongue - breathing problems - confusion, nightmares or hallucinations - feeling faint or lightheaded, falls - irregular heartbeat - joint, muscle or tendon pain or swelling - pain or trouble passing urine -persistent headache with or without blurred vision - redness, blistering, peeling or loosening of the skin, including inside the mouth - seizure - unusual pain, numbness, tingling, or weakness Side effects that usually do not require medical attention (report to your doctor or health care professional if they continue or are bothersome): - diarrhea - nausea or stomach upset - white patches or sores in the mouth This list may not describe all possible side effects. Call your doctor for medical advice about side effects. You may report side effects to FDA at 1-800-FDA-1088. Where should I keep my medicine? Keep out of the reach of children. Store at room temperature below 30 degrees C (86 degrees F). Keep container tightly closed. Throw away any unused medicine after the expiration date. NOTE: This sheet is a summary. It may not cover all possible information. If you have questions about this medicine, talk to your doctor, pharmacist, or health care provider.  2015, [employer]. (2012-12-01 16:10:46)  Metronidazole tablets or capsules What is this medicine? METRONIDAZOLE (me troe NI da zole) is an antiinfective. It is used to treat certain kinds of bacterial and protozoal infections. It will not work for colds, flu, or other viral infections. This medicine may be used for other purposes; ask your health care provider or pharmacist if you have questions. COMMON BRAND NAME(S): Flagyl What should I tell my health care provider before I take this medicine? They need to know if you have any of these conditions: -anemia or other blood  disorders -disease of the nervous system -fungal or yeast infection -if you drink alcohol containing drinks -liver disease -seizures -an unusual or allergic reaction to metronidazole, or other medicines, foods, dyes, or preservatives -pregnant or trying to get pregnant -breast-feeding How should I use this medicine? Take this medicine by mouth with a full glass of water. Follow the directions on the prescription label. Take your medicine at regular intervals. Do not take your medicine more often than directed. Take all of your medicine as directed even if you think you are better. Do not skip doses or stop your medicine early. Talk to your pediatrician regarding the use of this medicine in children. Special care may be needed. Overdosage: If you think you have taken too much of this medicine contact a poison control center or emergency room at once. NOTE: This medicine is only for you. Do not share this medicine with others. What if I miss a dose? If you miss a dose, take it as soon as you can. If it is almost time for your next dose, take only that dose. Do not take double or extra doses. What may interact with this medicine? Do not take this medicine with any of the following medications: -alcohol or  any product that contains alcohol -amprenavir oral solution -cisapride -disulfiram -dofetilide -dronedarone -paclitaxel injection -pimozide -ritonavir oral solution -sertraline oral solution -sulfamethoxazole-trimethoprim injection -thioridazine -ziprasidone This medicine may also interact with the following medications: -birth control pills -cimetidine -lithium -other medicines that prolong the QT interval (cause an abnormal heart rhythm) -phenobarbital -phenytoin -warfarin This list may not describe all possible interactions. Give your health care provider a list of all the medicines, herbs, non-prescription drugs, or dietary supplements you use. Also tell them if you smoke,  drink alcohol, or use illegal drugs. Some items may interact with your medicine. What should I watch for while using this medicine? Tell your doctor or health care professional if your symptoms do not improve or if they get worse. You may get drowsy or dizzy. Do not drive, use machinery, or do anything that needs mental alertness until you know how this medicine affects you. Do not stand or sit up quickly, especially if you are an older patient. This reduces the risk of dizzy or fainting spells. Avoid alcoholic drinks while you are taking this medicine and for three days afterward. Alcohol may make you feel dizzy, sick, or flushed. If you are being treated for a sexually transmitted disease, avoid sexual contact until you have finished your treatment. Your sexual partner may also need treatment. What side effects may I notice from receiving this medicine? Side effects that you should report to your doctor or health care professional as soon as possible: -allergic reactions like skin rash or hives, swelling of the face, lips, or tongue -confusion, clumsiness -difficulty speaking -discolored or sore mouth -dizziness -fever, infection -numbness, tingling, pain or weakness in the hands or feet -trouble passing urine or change in the amount of urine -redness, blistering, peeling or loosening of the skin, including inside the mouth -seizures -unusually weak or tired -vaginal irritation, dryness, or discharge Side effects that usually do not require medical attention (report to your doctor or health care professional if they continue or are bothersome): -diarrhea -headache -irritability -metallic taste -nausea -stomach pain or cramps -trouble sleeping This list may not describe all possible side effects. Call your doctor for medical advice about side effects. You may report side effects to FDA at 1-800-FDA-1088. Where should I keep my medicine? Keep out of the reach of children. Store at room  temperature below 25 degrees C (77 degrees F). Protect from light. Keep container tightly closed. Throw away any unused medicine after the expiration date. NOTE: This sheet is a summary. It may not cover all possible information. If you have questions about this medicine, talk to your doctor, pharmacist, or health care provider.  2015, [employer]. (2012-12-02 14:08:39)

## 2014-07-08 NOTE — ED Notes (Signed)
Per patient, she has had diarrhea since Thursday.  Patient was seen at [facility] and had c-diff completed.  Patient also c/o "lump" in neck; denies fever.

## 2014-07-08 NOTE — ED Provider Notes (Signed)
CSN: [id number]     Arrival date & time 07/08/14  0008 History   First MD Initiated Contact with Patient 07/08/14 0100     Chief Complaint  Patient presents with  . Diarrhea     (Consider location/radiation/quality/duration/timing/severity/associated sxs/prior Treatment) Patient is a 49 y.o. female presenting with diarrhea. The history is provided by the patient.  Diarrhea She is undergoing chemotherapy and radiation treatment for breast cancer. She has had diarrhea for the last 2 days. She is passing a large amount of watery stool. She's not noticed any blood or mucus. There is some generalized abdominal cramping which he rates at 3/10. There is associated nausea but no vomiting. She has tried taking loperamide with no benefit. She denies fever, chills, sweats. She was seen at the cancer center where she was given IV hydration without any definite improvement. Stool sample was left for C. difficile testing. She is also complaining of a tender area in the right side of her neck underneath her jaw bone and thinks there is a lump there.  Past Medical History  Diagnosis Date  . PONV (postoperative nausea and vomiting)     pt states scope patch does well  . Anemia   . Depression   . Anxiety   . Headache(784.0)     has migraines - medication controls  . Breast cancer 04/2014    Presumed dx; stage 4 w/ mets to bone and liver  . Bony metastasis    Past Surgical History  Procedure Laterality Date  . Right rotator cuff      2002  . Neck fusion      2003  . Laparoscopic cholecystectomy      2004  . Right knee arthroscopy      2005  . Colon surgery      2008  . Appendectomy      2008  . Abdominal hysterectomy    . Breast reduction surgery  03/17/2011    Procedure: MAMMARY REDUCTION BILATERAL (BREAST);  Surgeon: Mary A Contogiannis;  Location: [facility];  Service: Plastics;  Laterality: Bilateral;  . Colonoscopy N/A 07/13/2013    Procedure: COLONOSCOPY;  Surgeon: Rogene Houston, MD;  [facility];  Service: Endoscopy;  Laterality: N/A;  930  . Liver biopsy  04/2014  . Portacath placement    . Esophagogastroduodenoscopy N/A 05/25/2014    Procedure: ESOPHAGOGASTRODUODENOSCOPY (EGD);  Surgeon: Rogene Houston, MD;  Location[facility];  Service: Endoscopy;  Laterality: N/A;  155   Family History  Problem Relation Age of Onset  . Colon cancer Neg Hx   . Diabetes Father   . Heart attack Maternal Grandmother 30    multiple over lifetime.   History  Substance Use Topics  . Smoking status: Former Smoker    Types: Cigarettes  . Smokeless tobacco: Never Used  . Alcohol Use: Yes     Comment: Occasionally   OB History    No data available     Review of Systems  Gastrointestinal: Positive for diarrhea.  All other systems reviewed and are negative.     Allergies  Review of patient's allergies indicates no known allergies.  Home Medications   Prior to Admission medications   Medication Sig Start Date End Date Taking? Authorizing Provider  acetaminophen (TYLENOL) 325 MG tablet Take 650 mg by mouth every 6 (six) hours as needed for mild pain or fever.    Yes Historical Provider, MD  Alum & Mag Hydroxide-Simeth (MAGIC  MOUTHWASH) SOLN Take 10 mLs by mouth 4 (four) times daily as needed for mouth pain. 06/19/14  Yes Rexene Alberts, MD  Calcium-Phosphorus-Vitamin D (CALCIUM/D3 ADULT GUMMIES PO) Take by mouth. Patient taking 4 gummies a day to equal her dosage of calcium 1200mg  and vitamin d 1000mg    Yes Historical Provider, MD  chlorhexidine (PERIDEX) 0.12 % solution Use as directed 15 mLs in the mouth or throat 4 (four) times daily. 06/19/14  Yes Rexene Alberts, MD  dexamethasone (DECADRON) 4 MG tablet 2 tablet bid starting day before chemotherapy, day of and day after 06/12/14  Yes Molli Hazard, MD  diphenhydrAMINE (BENADRYL) 25 mg capsule Take 25 mg by mouth every 6 (six) hours as needed for itching.   Yes Historical Provider, MD   Docusate Calcium (STOOL SOFTENER PO) Take 1 capsule by mouth 2 (two) times daily.    Yes Historical Provider, MD  HYDROcodone-acetaminophen (NORCO) 7.5-325 MG per tablet Take 1 tablet by mouth every 8 (eight) hours as needed for moderate pain. 05/31/14  Yes Molli Hazard, MD  lidocaine (XYLOCAINE) 2 % solution Use as directed 5 mLs in the mouth or throat 4 (four) times daily as needed for mouth pain. 06/19/14  Yes Rexene Alberts, MD  lidocaine-prilocaine (EMLA) cream Apply 1 application topically daily as needed (apply to port before chemo).  05/17/14  Yes Historical Provider, MD  LORazepam (ATIVAN) 0.5 MG tablet Take 1 tablet (0.5 mg total) by mouth every 6 (six) hours as needed. anxiety/nausea 07/06/14  Yes Molli Hazard, MD  morphine (MS CONTIN) 15 MG 12 hr tablet Take 1 tablet (15 mg total) by mouth every 12 (twelve) hours. 07/03/14  Yes Molli Hazard, MD  morphine (MS CONTIN) 30 MG 12 hr tablet  05/31/14  Yes Historical Provider, MD  ondansetron (ZOFRAN) 8 MG tablet Take 1 tablet (8 mg total) by mouth every 8 (eight) hours as needed for nausea or vomiting. 05/17/14  Yes Molli Hazard, MD  pantoprazole (PROTONIX) 40 MG tablet Take 1 tablet (40 mg total) by mouth daily. 05/16/14  Yes Molli Hazard, MD  polyethylene glycol (MIRALAX / GLYCOLAX) packet Take 17 g by mouth daily as needed for mild constipation.   Yes Historical Provider, MD  prochlorperazine (COMPAZINE) 10 MG tablet Take 1 tablet (10 mg total) by mouth every 6 (six) hours as needed for nausea or vomiting. 07/05/14  Yes Molli Hazard, MD  sertraline (ZOLOFT) 100 MG tablet Take 2 tablets (200 mg total) by mouth daily. 06/12/14  Yes Molli Hazard, MD  sodium chloride (OCEAN) 0.65 % SOLN nasal spray Place 1 spray into both nostrils as needed for congestion. 06/19/14  Yes Rexene Alberts, MD  SUMAtriptan (IMITREX) 100 MG tablet Take 1 tablet (100 mg total) by mouth as needed. foir migraine  07/03/14  Yes Molli Hazard, MD  tamoxifen (NOLVADEX) 20 MG tablet Take 1 tablet (20 mg total) by mouth daily. 05/09/14  Yes Molli Hazard, MD  zolpidem (AMBIEN) 10 MG tablet Take 1 tablet (10 mg total) by mouth at bedtime as needed for sleep. 06/12/14 07/12/14 Yes Molli Hazard, MD   BP 118/73 mmHg  Pulse 89  Temp(Src) 98.1 F (36.7 C) (Oral)  Resp 18  Ht 5\' 8"  (1.727 m)  Wt 241 lb (109.317 kg)  BMI 36.65 kg/m2  SpO2 98% Physical Exam  Nursing note and vitals reviewed.  49 year old female, resting comfortably and in no acute distress. Vital signs are normal. Oxygen  saturation is 98%, which is normal. Head is normocephalic and atraumatic. PERRLA, EOMI. Oropharynx is clear. Neck is nontender and supple without adenopathy or JVD. There is tenderness at the submental area on the right at the angle of the mandible but no masses palpable and the area is symmetric compared to the left side. Back is nontender and there is no CVA tenderness. Lungs are clear without rales, wheezes, or rhonchi. Chest is nontender. Heart has regular rate and rhythm without murmur. Abdomen is soft, flat, with mild to moderate tenderness diffusely. No areas of localized tenderness. No rebound or guarding. There are no masses or hepatosplenomegaly and peristalsis is hypoactive. Rectal: Normal sphincter tone, no masses or hemorrhoids. Small amount of light brown stool present which is Hemoccult negative. Extremities have no cyanosis or edema, full range of motion is present. Skin is warm and dry without rash. Neurologic: Mental status is normal, cranial nerves are intact, there are no motor or sensory deficits.  ED Course  Procedures (including critical care time) Labs Review Results for orders placed or performed during the hospital encounter of 07/08/14  Comprehensive metabolic panel  Result Value Ref Range   Sodium 135 135 - 145 mmol/L   Potassium 3.1 (L) 3.5 - 5.1 mmol/L   Chloride  105 96 - 112 mmol/L   CO2 27 19 - 32 mmol/L   Glucose, Bld 132 (H) 70 - 99 mg/dL   BUN 11 6 - 23 mg/dL   Creatinine, Ser 0.70 0.50 - 1.10 mg/dL   Calcium 8.0 (L) 8.4 - 10.5 mg/dL   Total Protein 6.2 6.0 - 8.3 g/dL   Albumin 3.3 (L) 3.5 - 5.2 g/dL   AST 23 0 - 37 U/L   ALT 24 0 - 35 U/L   Alkaline Phosphatase 98 39 - 117 U/L   Total Bilirubin 0.6 0.3 - 1.2 mg/dL   GFR calc non Af Amer >90 >90 mL/min   GFR calc Af Amer >90 >90 mL/min   Anion gap 3 (L) 5 - 15  CBC with Differential  Result Value Ref Range   WBC 7.1 4.0 - 10.5 K/uL   RBC 3.76 (L) 3.87 - 5.11 MIL/uL   Hemoglobin 11.5 (L) 12.0 - 15.0 g/dL   HCT 36.0 36.0 - 46.0 %   MCV 95.7 78.0 - 100.0 fL   MCH 30.6 26.0 - 34.0 pg   MCHC 31.9 30.0 - 36.0 g/dL   RDW 17.3 (H) 11.5 - 15.5 %   Platelets 170 150 - 400 K/uL   Neutrophils Relative % 80 (H) 43 - 77 %   Neutro Abs 5.7 1.7 - 7.7 K/uL   Lymphocytes Relative 11 (L) 12 - 46 %   Lymphs Abs 0.8 0.7 - 4.0 K/uL   Monocytes Relative 8 3 - 12 %   Monocytes Absolute 0.6 0.1 - 1.0 K/uL   Eosinophils Relative 1 0 - 5 %   Eosinophils Absolute 0.1 0.0 - 0.7 K/uL   Basophils Relative 0 0 - 1 %   Basophils Absolute 0.0 0.0 - 0.1 K/uL  I-Stat CG4 Lactic Acid, ED  Result Value Ref Range   Lactic Acid, Venous 1.38 0.5 - 2.0 mmol/L    Imaging Review Ct Abdomen Pelvis W Contrast  07/08/2014   CLINICAL DATA:  Diarrhea for 3 days. Lump in neck. History of metastatic breast cancer, appendectomy and hysterectomy.  EXAM: CT ABDOMEN AND PELVIS WITH CONTRAST  TECHNIQUE: Multidetector CT imaging of the abdomen and pelvis was performed using the  standard protocol following bolus administration of intravenous contrast.  CONTRAST:  75mL OMNIPAQUE IOHEXOL 300 MG/ML SOLN, 14mL OMNIPAQUE IOHEXOL 300 MG/ML SOLN  COMPARISON:  Radiation CT July 02, 2014, and June 15, 2014 and MRI of the lumbar spine May 30, 2014 and CT of the abdomen and pelvis April 25, 2014  FINDINGS: LUNG BASES: Included  view of the lung bases are clear. Visualized heart and pericardium are unremarkable.  SOLID ORGANS: Diffuse hypodense hepatic metastasis, which are overall similar in number though, appears slightly more smaller suggesting treatment related changes. Stable dominant 3.6 cm lesion RIGHT lobe of the liver. Liver is now somewhat nodular. Status post cholecystectomy. Pancreas, adrenal glands and spleen are unremarkable.  GASTROINTESTINAL TRACT: Short segment of bowel wall thickening RIGHT lower quadrant associated with surgical anastomosis, axial 80/113, coronal 45/100. The stomach, and large bowel are normal in course and caliber without inflammatory changes. Status post appendectomy.  KIDNEYS/ URINARY TRACT: Kidneys are orthotopic, demonstrating symmetric enhancement. No nephrolithiasis, hydronephrosis or solid renal masses. The unopacified ureters are normal in course and caliber. Delayed imaging through the kidneys demonstrates symmetric prompt contrast excretion within the proximal urinary collecting system. Urinary bladder is partially distended and unremarkable.  PERITONEUM/RETROPERITONEUM: Aortoiliac vessels are normal in course and caliber. No lymphadenopathy by CT size criteria. Status post hysterectomy. No intraperitoneal free fluid nor free air.  SOFT TISSUE/OSSEOUS STRUCTURES: Diffuse osseous metastasis without pathologic fracture many of the lytic lesions are not sclerotic suggesting post treatment changes, however there may be new sclerotic metastasis. Anterior abdominal wall scarring.  IMPRESSION: Short symmetric segment of small bowel wall thickening or RIGHT lower quadrants is a with bowel anastomosis, however no bowel obstruction. This may be infectious or, inflammatory given the patient's history of treatment for breast cancer.  Diffuse hepatic metastasis are similar in number though generally smaller in size suggesting post treatment changes. Nodular liver contour, which could reflect early  cirrhosis.  Diffuse osseous metastasis, predominately sclerotic, this suggests a component of treatment related changes though, additional new sclerotic metastasis is not excluded. No pathologic fracture.   Electronically Signed   By: Elon Alas   On: 07/08/2014 03:38     MDM   Final diagnoses:  Abdominal pain, unspecified abdominal location  Diarrhea  Enteritis  Hypokalemia  Normochromic normocytic anemia    Diarrhea of uncertain cause. Old records are reviewed and her stool sample from 2 days ago was negative for estrogen difficile. She will be given IV hydration and CT scan of abdomen and pelvis will be obtained because of generalized tenderness.  CT shows evidence of enteritis. WBC is normal but there is a slight left shift. Patient feels somewhat better after IV hydration although she did require 2 doses of morphine. CT also showed evidence of significant metastatic disease. She does have narcotic analgesics and antiemetics at home. She is generally nontoxic in appearance, so was elected to give her a trial of treatment at home. She is discharged with prescriptions for ciprofloxacin and metronidazole and is to follow-up with her PCP in 2 days.  Delora Fuel, MD 65/03/54 [id number]

## 2014-07-09 ENCOUNTER — Encounter (HOSPITAL_COMMUNITY): Payer: Self-pay | Admitting: Hematology & Oncology

## 2014-07-09 ENCOUNTER — Encounter (HOSPITAL_BASED_OUTPATIENT_CLINIC_OR_DEPARTMENT_OTHER): Payer: 59 | Admitting: Hematology & Oncology

## 2014-07-09 ENCOUNTER — Ambulatory Visit (HOSPITAL_COMMUNITY)
Admission: RE | Admit: 2014-07-09 | Discharge: 2014-07-09 | Disposition: A | Discharge status: Home / Self Care | Payer: 59 | Source: Ambulatory Visit | Attending: Hematology & Oncology | Admitting: Hematology & Oncology

## 2014-07-09 VITALS — BP 116/62 | HR 88 | Temp 98.7°F | Resp 20 | Wt 244.0 lb

## 2014-07-09 DIAGNOSIS — R197 Diarrhea, unspecified: Secondary | ICD-10-CM

## 2014-07-09 DIAGNOSIS — I82C11 Acute embolism and thrombosis of right internal jugular vein: Secondary | ICD-10-CM | POA: Insufficient documentation

## 2014-07-09 DIAGNOSIS — M79601 Pain in right arm: Secondary | ICD-10-CM | POA: Diagnosis present

## 2014-07-09 DIAGNOSIS — R221 Localized swelling, mass and lump, neck: Secondary | ICD-10-CM | POA: Diagnosis not present

## 2014-07-09 DIAGNOSIS — M542 Cervicalgia: Secondary | ICD-10-CM

## 2014-07-09 DIAGNOSIS — I82409 Acute embolism and thrombosis of unspecified deep veins of unspecified lower extremity: Secondary | ICD-10-CM | POA: Insufficient documentation

## 2014-07-09 DIAGNOSIS — C50919 Malignant neoplasm of unspecified site of unspecified female breast: Secondary | ICD-10-CM

## 2014-07-09 DIAGNOSIS — I82C19 Acute embolism and thrombosis of unspecified internal jugular vein: Secondary | ICD-10-CM

## 2014-07-09 DIAGNOSIS — C229 Malignant neoplasm of liver, not specified as primary or secondary: Secondary | ICD-10-CM | POA: Diagnosis not present

## 2014-07-09 DIAGNOSIS — R079 Chest pain, unspecified: Secondary | ICD-10-CM

## 2014-07-09 DIAGNOSIS — I82B19 Acute embolism and thrombosis of unspecified subclavian vein: Secondary | ICD-10-CM

## 2014-07-09 DIAGNOSIS — C50812 Malignant neoplasm of overlapping sites of left female breast: Secondary | ICD-10-CM

## 2014-07-09 DIAGNOSIS — E876 Hypokalemia: Secondary | ICD-10-CM

## 2014-07-09 DIAGNOSIS — I82401 Acute embolism and thrombosis of unspecified deep veins of right lower extremity: Secondary | ICD-10-CM

## 2014-07-09 LAB — CBC WITH DIFFERENTIAL/PLATELET
Basophils Absolute: 0 10*3/uL (ref 0.0–0.1)
Basophils Relative: 0 % (ref 0–1)
EOS ABS: 0.2 10*3/uL (ref 0.0–0.7)
Eosinophils Relative: 2 % (ref 0–5)
HEMATOCRIT: 36.5 % (ref 36.0–46.0)
HEMOGLOBIN: 11.8 g/dL — AB (ref 12.0–15.0)
Lymphocytes Relative: 9 % — ABNORMAL LOW (ref 12–46)
Lymphs Abs: 0.7 10*3/uL (ref 0.7–4.0)
MCH: 30.9 pg (ref 26.0–34.0)
MCHC: 32.3 g/dL (ref 30.0–36.0)
MCV: 95.5 fL (ref 78.0–100.0)
MONO ABS: 0.7 10*3/uL (ref 0.1–1.0)
MONOS PCT: 10 % (ref 3–12)
Neutro Abs: 5.4 10*3/uL (ref 1.7–7.7)
Neutrophils Relative %: 79 % — ABNORMAL HIGH (ref 43–77)
Platelets: 168 10*3/uL (ref 150–400)
RBC: 3.82 MIL/uL — AB (ref 3.87–5.11)
RDW: 16.9 % — ABNORMAL HIGH (ref 11.5–15.5)
WBC: 6.9 10*3/uL (ref 4.0–10.5)

## 2014-07-09 LAB — POTASSIUM: Potassium: 3.5 mmol/L (ref 3.5–5.1)

## 2014-07-09 MED ORDER — POTASSIUM BICARB-CITRIC ACID 20 MEQ PO TBEF: 20.0000 meq | EFFERVESCENT_TABLET | Freq: Two times a day (BID) | ORAL | Status: DC

## 2014-07-09 MED ORDER — MORPHINE SULFATE 2 MG/ML IJ SOLN
4.0000 mg | Freq: Once | INTRAMUSCULAR | Status: AC
  Administered 2014-07-09: 2 mg via INTRAVENOUS
  Filled 2014-07-09: qty 2

## 2014-07-09 MED ORDER — ENOXAPARIN SODIUM 150 MG/ML ~~LOC~~ SOLN: 1.0000 mg/kg | Freq: Two times a day (BID) | SUBCUTANEOUS | Status: DC

## 2014-07-09 MED ORDER — HEPARIN SOD (PORK) LOCK FLUSH 100 UNIT/ML IV SOLN
INTRAVENOUS | Status: AC
  Filled 2014-07-09: qty 5

## 2014-07-09 MED ORDER — HEPARIN SOD (PORK) LOCK FLUSH 100 UNIT/ML IV SOLN
500.0000 [IU] | Freq: Once | INTRAVENOUS | Status: AC
  Administered 2014-07-09: 500 [IU] via INTRAVENOUS

## 2014-07-09 MED ORDER — SODIUM CHLORIDE 0.9 % IJ SOLN
10.0000 mL | INTRAMUSCULAR | Status: DC | PRN
  Administered 2014-07-09: 10 mL via INTRAVENOUS
  Filled 2014-07-09: qty 10

## 2014-07-09 MED ORDER — ENOXAPARIN SODIUM 120 MG/0.8ML ~~LOC~~ SOLN
110.0000 mg | Freq: Once | SUBCUTANEOUS | Status: AC
  Administered 2014-07-09: 110 mg via SUBCUTANEOUS
  Filled 2014-07-09: qty 0.8

## 2014-07-09 MED ORDER — MORPHINE SULFATE 2 MG/ML IJ SOLN
2.0000 mg | Freq: Once | INTRAMUSCULAR | Status: AC
Start: 2014-07-09 — End: 2014-07-09
  Administered 2014-07-09: 2 mg via INTRAVENOUS
  Filled 2014-07-09: qty 1

## 2014-07-09 NOTE — Assessment & Plan Note (Signed)
She has had diarrhea since being treated with Herceptin and Perjeta. C. difficile is negative. Do not suspect infectious diarrhea at this point and therefore has instructed her on how to use Imodium. We discussed the incidence of diarrhea with perjeta therapy. We will continue to monitor this moving forward. I advised her if her diarrhea does not improve in the next several days please call.

## 2014-07-09 NOTE — Progress Notes (Signed)
[name] presents today for injection per MD orders and education for self injection of lovenox at home.. Lovenox 110 mg administered SQ in left Abdomen by patient. Administration without incident. Patient tolerated well.

## 2014-07-09 NOTE — Assessment & Plan Note (Signed)
I have called in [name] since she is currently having difficulty swallowing her potassium pills. Her potassium today is improved since the weekend and I have encouraged her to continue on her potassium for now.

## 2014-07-09 NOTE — Assessment & Plan Note (Signed)
49 year old female with stage IV breast cancer presents with worsening pain in the right chest and neck, significant swelling. Ultrasound shows DVT of the internal jugular and subclavian. We have started her on Lovenox today. She was instructed on appropriate use of Lovenox and feels comfortable administering it at home as she has given this medication to family member. We did a long discussion in regards to the causes of DVT in cancer patients. She understands the risks and benefits of anticoagulant therapy and wishes to proceed. I advised her she has nay difficulty administering the medication to let us know.

## 2014-07-09 NOTE — Patient Instructions (Addendum)
[place] at [facility] Discharge Instructions  RECOMMENDATIONS MADE BY THE CONSULTANT AND ANY TEST RESULTS WILL BE SENT TO YOUR REFERRING PHYSICIAN.  Exam and discussion by Dr. Whitney Muse.  You have a blood clot in your right jugular  And subclavian veins.  You will need to be on anticoagulant therapy. Lovenox to be taken twice daily. If any problems getting the Lovenox, let us know. Report uncontrolled pain or other concerns.  Follow-up with next treatment.  Thank you for choosing [place] at [facility] to provide your oncology and hematology care.  To afford each patient quality time with our provider, please arrive at least 15 minutes before your scheduled appointment time.    You need to re-schedule your appointment should you arrive 10 or more minutes late.  We strive to give you quality time with our providers, and arriving late affects you and other patients whose appointments are after yours.  Also, if you no show three or more times for appointments you may be dismissed from the clinic at the providers discretion.     Again, thank you for choosing [facility].  Our hope is that these requests will decrease the amount of time that you wait before being seen by our physicians.       _____________________________________________________________  Should you have questions after your visit to [facility], please contact our office at [phone] between the hours of 8:30 a.m. and 4:30 p.m.  Voicemails left after 4:30 p.m. will not be returned until the following business day.  For prescription refill requests, have your pharmacy contact our office.   Enoxaparin, Home Use Enoxaparin (Lovenox) injection is a medication used to prevent clots from developing in your veins. Medications such as enoxaparin are called blood thinners or anticoagulants. If blood clots are untreated they could travel to your lungs. This is  called a pulmonary embolus. A blood clot in your lungs can be fatal. Caregivers often use anticoagulants such as enoxaparin to prevent clots following surgery. It is also used along with aspirin when the heart is not getting enough blood. Continue the enoxaparin injections as directed by your caregiver. Your caregiver will use blood clotting test results to decide when you can safely stop using enoxaparin injections. If your caregiver prescribes any additional anticoagulant, you must take it exactly as directed. RISKS AND COMPLICATIONS  If you have received recent epidural anesthesia, spinal anesthesia, or a spinal tap while receiving anticoagulants, you are at risk for developing a blood clot in or around the spine. This condition could result in long-term or permanent paralysis.  Because anticoagulants thin your blood, severe bleeding may occur from any tissue or organ. Symptoms of the blood being too thin may include:  Bleeding from the nose or gums that does not stop quickly.  Unusual bruising or bruising easily.  Swelling or pain at an injection site.  A cut that does not stop bleeding within 10 minutes.  Continual nausea for more than 1 day or vomiting blood.  Coughing up blood.  Blood in the urine which may appear as pink, red, or brown urine.  Blood in bowel movements which may appear as red, dark or black stools.  Sudden weakness or numbness of the face, arm, or leg, especially on one side of the body.  Sudden confusion.  Trouble speaking (aphasia) or understanding.  Sudden trouble seeing in one or both eyes.  Sudden trouble walking.  Dizziness.  Loss of balance or coordination.  Severe pain, such as a headache, joint pain, or back pain.  Fever.  Bruising around the injection sites may be expected.  Platelet drops, known as "thrombocytopenia," can occur with enoxaparin use. A condition called "heparin-induced thrombocytopenia" has been seen. If you have had this  condition, you should tell your caregiver. Your caregiver may direct you to have blood tests to monitor this condition.  Do not use if you have allergies to the medication, heparin, or pork products.  Other side effects may include mild local reactions or irritation at the site of injection, pain, bruising, and redness of skin. HOME CARE INSTRUCTIONS You will be instructed by your caregiver how to give enoxaparin injections. 1. Before giving your medication you should make sure the injection is a clear and colorless or pale yellow solution. If your medication becomes discolored or has particles in the bottle, do not use and notify your caregiver. 2. When using the 30 and 40 mg pre-filled syringes, do not expel the air bubble from the syringe before the injection. This makes sure you use all the medication in the syringe. 3. The injections will be given subcutaneously. This means it is given into the fat over the belly (abdomen). It is given deep beneath the skin but not into the muscle. The shots should be injected around the abdominal wall. Change the sites of injection each time. The whole length of the needle should be introduced into a skin fold held between the thumb and forefinger; the skin fold should be held throughout the injection. Do not rub the injection site after completion of the injection. This increases bruising. Enoxaparin injection pre-filled syringes and graduated pre-filled syringes are available with a system that shields the needle after injection. 4. Inject by pushing the plunger to the bottom of the syringe. 5. Remove the syringe from the injection site keeping your finger on the plunger rod. Be careful not to stick yourself or others. 6. After injection and the syringe is empty, set off the safety system by firmly pushing the plunger rod. The protective sleeve will automatically cover the needle and you can hear a click. The click means your needle is safely covered. Do not try  replacing the needle shield. 7. Get rid of the syringe in the nearest sharps container. 8. Keep your medication safely stored at room temperatures.  Due to the complications of anticoagulants, it is very important that you take your anticoagulant as directed by your caregiver. Anticoagulants need to be taken exactly as instructed. Be sure you understand all your anticoagulant instructions.  Changes in medicines, supplements, diet, and illness can affect your anticoagulation therapy. Be sure to inform your caregivers of any of these changes.  While on anticoagulants, you will need to have blood tests done routinely as directed by your caregivers.  Be careful not to cut yourself when using sharp objects.  Limit physical activities or sports that could result in a fall or cause injury.  It is extremely important that you tell all of your caregivers and dentist that you are taking an anticoagulant, especially if you are injured or plan to have any type of procedure or operation.  Follow up with your laboratory test and caregiver appointments as directed. It is very important to keep your appointments. Not keeping appointments could result in a chronic or permanent injury, pain, or disability. SEEK MEDICAL CARE IF:  You develop any rashes.  You have any worsening of the condition for which  you are receiving anticoagulation therapy. SEEK IMMEDIATE MEDICAL CARE IF:  Bleeding from the nose or gums does not stop quickly.  You have unusual bruising or are bruising easily.  Swelling or pain occurs at an injection site.  A cut does not stop bleeding within 10 minutes.  You have continual nausea for more than 1 day or are vomiting blood.  You are coughing up blood.  You have blood in the urine.  You have dark or black stools.  You have sudden weakness or numbness of the face, arm, or leg, especially on one side of the body.  You have sudden confusion.  You have trouble speaking  (aphasia) or understanding.  You have sudden trouble seeing in one or both eyes.  You have sudden trouble walking.  You have dizziness.  You have a loss of balance or coordination.  You have severe pain, such as a headache, joint pain, or back pain.  You have a serious fall or head injury, even if you are not bleeding.  You have an oral temperature above 102 F (38.9 C), not controlled by medicine. ANY OF THESE SYMPTOMS MAY REPRESENT A SERIOUS PROBLEM THAT IS AN EMERGENCY. Do not wait to see if the symptoms will go away. Get medical help right away. Call your local emergency services (911 in [place] DO NOT drive yourself to the hospital. MAKE SURE YOU:  Understand these instructions.  Will watch your condition.  Will get help right away if you are not doing well or get worse. Document Released: 02/27/2004 Document Revised: 07/20/2011 Document Reviewed: 04/27/2005 [facility] Patient Information 2015 [place], [place]. This information is not intended to replace advice given to you by your health care provider. Make sure you discuss any questions you have with your health care provider.

## 2014-07-09 NOTE — Progress Notes (Signed)
Allison Labrum, MD [place] [address]  Stage IV adenocarcinoma with metastases to liver and bone  Had last mammogram in [place] this year, Dr. Gaetano Net  Colonoscopy 05/18/2013  Breast Reduction 03/17/2011  CURRENT THERAPY: Perjeta/Herceptin/Tamoxifen  INTERVAL HISTORY: [name] 49 y.o. female returns for follow-up of stage IV adenocarcinoma. Her primary site it felt to be breast.  Drained of right upper chest pain along the catheter site last week. There was no associated swelling or redness. She states that pain resolved. On Friday she noted a "sore throat". She comes in today with right neck swelling and significant pain. She complains of pain with swallowing and is finding it hard to swallow pills. She was also seen in the emergency department over the weekend for ongoing problems with diarrhea. She did have C. difficile testing ordered by [place] last week that is negative. She has not been using Imodium.  She has multiple questions about all of her when necessary medications. She is brought them with her to the clinic today for [place] to review. She notes that her back pain and shoulder pain are improving with radiation. She expresses frustration at the difficulties that she has had with her therapy. She states she tries to stay positive.  MEDICAL HISTORY: Past Medical History  Diagnosis Date  . PONV (postoperative nausea and vomiting)     pt states scope patch does well  . Anemia   . Depression   . Anxiety   . Headache(784.0)     has migraines - medication controls  . Breast cancer 04/2014    Presumed dx; stage 4 w/ mets to bone and liver  . Bony metastasis     has Hypertrophy of breast; Chest pain, rule out acute myocardial infarction; Hyperlipidemia; Depression; Hypokalemia; Elevated LFTs; Liver lesion; Intractable abdominal pain; Breast cancer, stage 4; Metastatic adenocarcinoma; Bone metastases; Epigastric abdominal pain; Breast cancer; Fever;  Pancytopenia; Neutropenic fever; Bony metastasis; Mucositis due to chemotherapy; Diarrhea; and DVT (deep venous thrombosis) on her problem list.      Breast cancer, stage 4   04/25/2014 Initial Diagnosis Breast cancer, stage 4   04/25/2014 Imaging CT abdomen/pelvia with widespread metastatic disease to the liver, multiple lytic lesions throughout spine and pelvis. No FX or epidural tumor identified   04/26/2014 Imaging CT head unremarkable   04/26/2014 Imaging CT chest with no lung mass or pulmonary nodules, no adenopathy. Lytic bone lesions, right 2nd rib   04/27/2014 Initial Biopsy U/S guided liver biopsy, lesion in anterior and inferior left hepatic lobe biopsied   04/27/2014 Pathology Results Metastatic adenocarcinoma, CK7, ER+, patchy positivity with PR. Possible primary includes breast, less likely gynecologic   05/15/2014 Mammogram BI-RADS CATEGORY  2: Benign Finding(s)   05/16/2014 PET scan 1. Intensely hypermetabolic hepatic metastasis. 2. Widespread hypermetabolic skeletal lesions. 3. No primary adenocarcinoma identified by FDG PET imaging.   05/19/2014 Imaging MUGA- Left ventricular ejection fracture greater than 70%.   05/21/2014 Breast MRI No suspicious masses or enhancement within the breasts. No axillary adenopathy.   05/22/2014 -  Antibody Plan Herceptin/Perjeta/Tamoxifen   06/12/2014 -  Chemotherapy Taxotere added secondary to persistent abdominal and back pain   06/17/2014 - 06/19/2014 Hospital Admission Neutropenia, fever, diarrhea, nausea, vomiting   06/21/2014 -  Radiation Therapy Dr. Thea Silversmith 12 fractions to lumbar spine,      has No Known Allergies.  We administered morphine, heparin lock flush, sodium chloride, morphine, and enoxaparin.  SURGICAL HISTORY: Past Surgical History  Procedure Laterality Date  . Right rotator cuff      2002  . Neck fusion      2003  . Laparoscopic cholecystectomy      2004  . Right knee arthroscopy      2005  . Colon surgery      2008    . Appendectomy      2008  . Abdominal hysterectomy    . Breast reduction surgery  03/17/2011    Procedure: MAMMARY REDUCTION BILATERAL (BREAST);  Surgeon: Mary A Contogiannis;  Location: [facility];  Service: Plastics;  Laterality: Bilateral;  . Colonoscopy N/A 07/13/2013    Procedure: COLONOSCOPY;  Surgeon: Rogene Houston, MD;  [facility];  Service: Endoscopy;  Laterality: N/A;  930  . Liver biopsy  04/2014  . Portacath placement    . Esophagogastroduodenoscopy N/A 05/25/2014    Procedure: ESOPHAGOGASTRODUODENOSCOPY (EGD);  Surgeon: Rogene Houston, MD;  Location: [facility];  Service: Endoscopy;  Laterality: N/A;  155    SOCIAL HISTORY: History   Social History  . Marital Status: Married    Spouse Name: N/A  . Number of Children: N/A  . Years of Education: N/A   Occupational History  . Not on file.   Social History Main Topics  . Smoking status: Former Smoker    Types: Cigarettes  . Smokeless tobacco: Never Used  . Alcohol Use: Yes     Comment: Occasionally  . Drug Use: No  . Sexual Activity: Yes    Birth Control/ Protection: Surgical   Other Topics Concern  . Not on file   Social History Narrative    FAMILY HISTORY: Family History  Problem Relation Age of Onset  . Colon cancer Neg Hx   . Diabetes Father   . Heart attack Maternal Grandmother 30    multiple over lifetime.    Review of Systems  Constitutional: Positive for malaise/fatigue.       Low grade temp of 100 this am  HENT: Positive for sore throat.   Eyes: Negative.   Respiratory: Negative.   Cardiovascular: Negative.   Gastrointestinal: Negative.   Genitourinary: Negative.   Musculoskeletal:       Back and shoulder pain improved  Skin: Negative.   Neurological: Positive for weakness. Negative for dizziness, tingling, tremors, sensory change, speech change, focal weakness, seizures and loss of consciousness.  Endo/Heme/Allergies: Negative.   Psychiatric/Behavioral:  Negative for depression, suicidal ideas, hallucinations, memory loss and substance abuse. The patient is nervous/anxious and has insomnia.        Insomnia relieved by Lorrin Mais    PHYSICAL EXAMINATION  ECOG PERFORMANCE STATUS: 1 - Symptomatic but completely ambulatory  Filed Vitals:   07/09/14 1016  BP: 116/62  Pulse: 88  Temp: 98.7 F (37.1 C)  Resp: 20    Physical Exam  Constitutional: She is oriented to person, place, and time and well-developed, well-nourished, and in no distress.  alopecia  HENT:  Head: Normocephalic and atraumatic.  Nose: Nose normal.  Mouth/Throat: Oropharynx is clear and moist. No oropharyngeal exudate.  Eyes: Conjunctivae and EOM are normal. Pupils are equal, round, and reactive to light. Right eye exhibits no discharge. Left eye exhibits no discharge. No scleral icterus.  Neck: Normal range of motion. No tracheal deviation present. No thyromegaly present.  R neck swelling, pain with palpation, no significant erythema. Swelling extending down onto anterior upper chest.  Cardiovascular: Normal rate, regular rhythm and normal heart sounds.  Exam reveals no  gallop and no friction rub.   No murmur heard. Pulmonary/Chest: Effort normal and breath sounds normal. She has no wheezes. She has no rales.  Abdominal: Soft. Bowel sounds are normal. She exhibits no distension and no mass. There is no tenderness. There is no rebound and no guarding.  Musculoskeletal: Normal range of motion. She exhibits no edema.  Lymphadenopathy:    She has no cervical adenopathy.  Neurological: She is alert and oriented to person, place, and time. She has normal reflexes. No cranial nerve deficit. Gait normal. Coordination normal.  Skin: Skin is warm and dry. No rash noted.  Psychiatric: Mood, memory, affect and judgment normal.  Nursing note and vitals reviewed.   LABORATORY DATA:  CBC    Component Value Date/Time   WBC 6.9 07/09/2014 1210   RBC 3.82* 07/09/2014 1210   HGB  11.8* 07/09/2014 1210   HCT 36.5 07/09/2014 1210   PLT 168 07/09/2014 1210   MCV 95.5 07/09/2014 1210   MCH 30.9 07/09/2014 1210   MCHC 32.3 07/09/2014 1210   RDW 16.9* 07/09/2014 1210   LYMPHSABS 0.7 07/09/2014 1210   MONOABS 0.7 07/09/2014 1210   EOSABS 0.2 07/09/2014 1210   BASOSABS 0.0 07/09/2014 1210   CMP     Component Value Date/Time   NA 135 07/08/2014 0200   K 3.5 07/09/2014 1210   CL 105 07/08/2014 0200   CO2 27 07/08/2014 0200   GLUCOSE 132* 07/08/2014 0200   BUN 11 07/08/2014 0200   CREATININE 0.70 07/08/2014 0200   CALCIUM 8.0* 07/08/2014 0200   PROT 6.2 07/08/2014 0200   ALBUMIN 3.3* 07/08/2014 0200   AST 23 07/08/2014 0200   ALT 24 07/08/2014 0200   ALKPHOS 98 07/08/2014 0200   BILITOT 0.6 07/08/2014 0200   GFRNONAA >90 07/08/2014 0200   GFRAA >90 07/08/2014 0200      ASSESSMENT and THERAPY PLAN:    Breast cancer, stage 35 49 year old female with stage IV ER positive HER-2 positive carcinoma of the breast. We started her on Herceptin, per general, and tamoxifen. She had persistent pain in the back and abdomen and therefore we added Taxotere. I opted not to discontinue her tamoxifen.  Because of ongoing pain issues we referred her to radiation oncology and she is getting ready to complete palliative radiation to the lumbar spine and left shoulder. She has had a difficult time with treatment and I have encouraged her today that a lot of her symptoms and side effects are temporary. I reviewed all of her when necessary medications in again instructed her on appropriate use. We will see her back again prior to her next cycle of chemotherapy, certainly sooner if needed.   Diarrhea She has had diarrhea since being treated with Herceptin and Perjeta. C. difficile is negative. Do not suspect infectious diarrhea at this point and therefore has instructed her on how to use Imodium. We discussed the incidence of diarrhea with perjeta therapy. We will continue to monitor  this moving forward. I advised her if her diarrhea does not improve in the next several days please call.   Hypokalemia I have called in [name] since she is currently having difficulty swallowing her potassium pills. Her potassium today is improved since the weekend and I have encouraged her to continue on her potassium for now.   DVT (deep venous thrombosis) 49 year old female with stage IV breast cancer presents with worsening pain in the right chest and neck, significant swelling. Ultrasound shows DVT of the internal jugular and  subclavian. We have started her on Lovenox today. She was instructed on appropriate use of Lovenox and feels comfortable administering it at home as she has given this medication to family member. We did a long discussion in regards to the causes of DVT in cancer patients. She understands the risks and benefits of anticoagulant therapy and wishes to proceed. I advised her she has nay difficulty administering the medication to let us know.     All questions were answered. The patient knows to call the clinic with any problems, questions or concerns. We can certainly see the patient much sooner if necessary.   Molli Hazard 07/09/2014

## 2014-07-09 NOTE — Assessment & Plan Note (Signed)
49 year old female with stage IV ER positive HER-2 positive carcinoma of the breast. We started her on Herceptin, per general, and tamoxifen. She had persistent pain in the back and abdomen and therefore we added Taxotere. I opted not to discontinue her tamoxifen.  Because of ongoing pain issues we referred her to radiation oncology and she is getting ready to complete palliative radiation to the lumbar spine and left shoulder. She has had a difficult time with treatment and I have encouraged her today that a lot of her symptoms and side effects are temporary. I reviewed all of her when necessary medications in again instructed her on appropriate use. We will see her back again prior to her next cycle of chemotherapy, certainly sooner if needed.

## 2014-07-10 ENCOUNTER — Encounter (HOSPITAL_COMMUNITY): Payer: 59 | Attending: Hematology & Oncology

## 2014-07-10 ENCOUNTER — Encounter (HOSPITAL_COMMUNITY): Payer: Self-pay

## 2014-07-10 VITALS — BP 114/59 | HR 91 | Temp 98.0°F | Resp 18

## 2014-07-10 DIAGNOSIS — I82B19 Acute embolism and thrombosis of unspecified subclavian vein: Secondary | ICD-10-CM

## 2014-07-10 DIAGNOSIS — C7951 Secondary malignant neoplasm of bone: Secondary | ICD-10-CM | POA: Insufficient documentation

## 2014-07-10 DIAGNOSIS — C229 Malignant neoplasm of liver, not specified as primary or secondary: Secondary | ICD-10-CM | POA: Insufficient documentation

## 2014-07-10 DIAGNOSIS — I82401 Acute embolism and thrombosis of unspecified deep veins of right lower extremity: Secondary | ICD-10-CM

## 2014-07-10 DIAGNOSIS — I82C19 Acute embolism and thrombosis of unspecified internal jugular vein: Secondary | ICD-10-CM

## 2014-07-10 MED ORDER — ENOXAPARIN SODIUM 120 MG/0.8ML ~~LOC~~ SOLN
110.0000 mg | Freq: Once | SUBCUTANEOUS | Status: AC
  Administered 2014-07-10: 110 mg via SUBCUTANEOUS
  Filled 2014-07-10: qty 0.8

## 2014-07-10 NOTE — Progress Notes (Signed)
[name] presents today for injection per MD orders. Lovenox 110 mg administered SQ by patient herself in right Abdomen. Administration without incident. Patient tolerated well.

## 2014-07-12 ENCOUNTER — Other Ambulatory Visit (HOSPITAL_COMMUNITY): Payer: Self-pay | Admitting: *Deleted

## 2014-07-12 DIAGNOSIS — C50919 Malignant neoplasm of unspecified site of unspecified female breast: Secondary | ICD-10-CM

## 2014-07-12 MED ORDER — DIPHENOXYLATE-ATROPINE 2.5-0.025 MG PO TABS: 1.0000 | ORAL_TABLET | Freq: Four times a day (QID) | ORAL | Status: DC | PRN

## 2014-07-12 MED ORDER — DIPHENOXYLATE-ATROPINE 2.5-0.025 MG PO TABS: ORAL_TABLET | ORAL | Status: DC

## 2014-07-13 ENCOUNTER — Encounter (HOSPITAL_COMMUNITY): Payer: Self-pay

## 2014-07-13 ENCOUNTER — Encounter (HOSPITAL_BASED_OUTPATIENT_CLINIC_OR_DEPARTMENT_OTHER): Payer: 59

## 2014-07-13 DIAGNOSIS — R197 Diarrhea, unspecified: Secondary | ICD-10-CM

## 2014-07-13 DIAGNOSIS — E876 Hypokalemia: Secondary | ICD-10-CM

## 2014-07-13 MED ORDER — SODIUM CHLORIDE 0.9 % IV SOLN
INTRAVENOUS | Status: DC
Start: 2014-07-13 — End: 2014-07-13
  Administered 2014-07-13: 10:00:00 via INTRAVENOUS

## 2014-07-13 MED ORDER — HEPARIN SOD (PORK) LOCK FLUSH 100 UNIT/ML IV SOLN
500.0000 [IU] | Freq: Once | INTRAVENOUS | Status: AC
  Administered 2014-07-13: 500 [IU] via INTRAVENOUS

## 2014-07-13 MED ORDER — PROCHLORPERAZINE MALEATE 10 MG PO TABS
10.0000 mg | ORAL_TABLET | Freq: Four times a day (QID) | ORAL | Status: DC | PRN
  Administered 2014-07-13: 10 mg via ORAL

## 2014-07-13 MED ORDER — HEPARIN SOD (PORK) LOCK FLUSH 100 UNIT/ML IV SOLN
INTRAVENOUS | Status: AC
  Filled 2014-07-13: qty 5

## 2014-07-13 MED ORDER — LORAZEPAM 2 MG/ML IJ SOLN
INTRAMUSCULAR | Status: AC
  Filled 2014-07-13: qty 1

## 2014-07-13 MED ORDER — PROCHLORPERAZINE MALEATE 10 MG PO TABS
ORAL_TABLET | ORAL | Status: AC
  Filled 2014-07-13: qty 1

## 2014-07-13 MED ORDER — LORAZEPAM 2 MG/ML IJ SOLN
0.5000 mg | Freq: Once | INTRAMUSCULAR | Status: AC
  Administered 2014-07-13: 0.5 mg via INTRAVENOUS

## 2014-07-13 NOTE — Progress Notes (Signed)
Kenedy H Duffus tolerated fluids and nausea medication.  Discharged ambulatory

## 2014-07-13 NOTE — Patient Instructions (Signed)
[place] at [facility]  Discharge Instructions:  You had fluids today and nausea medication.  Please call the clinic if you have any questions or concerns. Hope you feel better. _______________________________________________________________  Thank you for choosing [place] at [facility] to provide your oncology and hematology care.  To afford each patient quality time with our providers, please arrive at least 15 minutes before your scheduled appointment.  You need to re-schedule your appointment if you arrive 10 or more minutes late.  We strive to give you quality time with our providers, and arriving late affects you and other patients whose appointments are after yours.  Also, if you no show three or more times for appointments you may be dismissed from the clinic.  Again, thank you for choosing [place] at [facility] hope is that these requests will allow you access to exceptional care and in a timely manner. _______________________________________________________________  If you have questions after your visit, please contact our office at [phone] between the hours of 8:30 a.m. and 5:00 p.m. Voicemails left after 4:30 p.m. will not be returned until the following business day. _______________________________________________________________  For prescription refill requests, have your pharmacy contact our office. _______________________________________________________________  Recommendations made by the consultant and any test results will be sent to your referring physician. _______________________________________________________________

## 2014-07-16 ENCOUNTER — Other Ambulatory Visit (HOSPITAL_COMMUNITY): Payer: Self-pay | Admitting: Oncology

## 2014-07-16 ENCOUNTER — Encounter (HOSPITAL_BASED_OUTPATIENT_CLINIC_OR_DEPARTMENT_OTHER): Payer: 59

## 2014-07-16 ENCOUNTER — Encounter (HOSPITAL_COMMUNITY): Payer: Self-pay | Admitting: *Deleted

## 2014-07-16 ENCOUNTER — Inpatient Hospital Stay (HOSPITAL_COMMUNITY)
Admission: AD | Admit: 2014-07-16 | Discharge: 2014-07-20 | DRG: 394 | Disposition: A | Discharge status: Home / Self Care | Payer: 59 | Source: Ambulatory Visit | Attending: Internal Medicine | Admitting: Internal Medicine

## 2014-07-16 VITALS — BP 86/64 | HR 138 | Temp 97.9°F | Resp 20 | Wt 231.5 lb

## 2014-07-16 DIAGNOSIS — Z170 Estrogen receptor positive status [ER+]: Secondary | ICD-10-CM

## 2014-07-16 DIAGNOSIS — R197 Diarrhea, unspecified: Secondary | ICD-10-CM | POA: Diagnosis not present

## 2014-07-16 DIAGNOSIS — K5900 Constipation, unspecified: Secondary | ICD-10-CM | POA: Diagnosis present

## 2014-07-16 DIAGNOSIS — K521 Toxic gastroenteritis and colitis: Secondary | ICD-10-CM

## 2014-07-16 DIAGNOSIS — Z79899 Other long term (current) drug therapy: Secondary | ICD-10-CM | POA: Diagnosis not present

## 2014-07-16 DIAGNOSIS — T451X5A Adverse effect of antineoplastic and immunosuppressive drugs, initial encounter: Secondary | ICD-10-CM | POA: Diagnosis present

## 2014-07-16 DIAGNOSIS — C7951 Secondary malignant neoplasm of bone: Secondary | ICD-10-CM | POA: Diagnosis not present

## 2014-07-16 DIAGNOSIS — C787 Secondary malignant neoplasm of liver and intrahepatic bile duct: Secondary | ICD-10-CM | POA: Diagnosis present

## 2014-07-16 DIAGNOSIS — Z981 Arthrodesis status: Secondary | ICD-10-CM | POA: Diagnosis not present

## 2014-07-16 DIAGNOSIS — K219 Gastro-esophageal reflux disease without esophagitis: Secondary | ICD-10-CM | POA: Diagnosis present

## 2014-07-16 DIAGNOSIS — Z87891 Personal history of nicotine dependence: Secondary | ICD-10-CM | POA: Diagnosis not present

## 2014-07-16 DIAGNOSIS — D72819 Decreased white blood cell count, unspecified: Secondary | ICD-10-CM | POA: Diagnosis not present

## 2014-07-16 DIAGNOSIS — I951 Orthostatic hypotension: Secondary | ICD-10-CM

## 2014-07-16 DIAGNOSIS — Z86718 Personal history of other venous thrombosis and embolism: Secondary | ICD-10-CM

## 2014-07-16 DIAGNOSIS — C50919 Malignant neoplasm of unspecified site of unspecified female breast: Secondary | ICD-10-CM

## 2014-07-16 DIAGNOSIS — D696 Thrombocytopenia, unspecified: Secondary | ICD-10-CM | POA: Diagnosis not present

## 2014-07-16 DIAGNOSIS — I82409 Acute embolism and thrombosis of unspecified deep veins of unspecified lower extremity: Secondary | ICD-10-CM

## 2014-07-16 DIAGNOSIS — F419 Anxiety disorder, unspecified: Secondary | ICD-10-CM | POA: Diagnosis present

## 2014-07-16 DIAGNOSIS — Z79810 Long term (current) use of selective estrogen receptor modulators (SERMs): Secondary | ICD-10-CM

## 2014-07-16 DIAGNOSIS — E876 Hypokalemia: Secondary | ICD-10-CM | POA: Diagnosis present

## 2014-07-16 DIAGNOSIS — D649 Anemia, unspecified: Secondary | ICD-10-CM | POA: Diagnosis not present

## 2014-07-16 DIAGNOSIS — E860 Dehydration: Secondary | ICD-10-CM

## 2014-07-16 DIAGNOSIS — F329 Major depressive disorder, single episode, unspecified: Secondary | ICD-10-CM | POA: Diagnosis present

## 2014-07-16 DIAGNOSIS — R531 Weakness: Secondary | ICD-10-CM | POA: Diagnosis present

## 2014-07-16 DIAGNOSIS — C229 Malignant neoplasm of liver, not specified as primary or secondary: Secondary | ICD-10-CM | POA: Diagnosis present

## 2014-07-16 LAB — CLOSTRIDIUM DIFFICILE BY PCR: Toxigenic C. Difficile by PCR: NEGATIVE

## 2014-07-16 LAB — COMPREHENSIVE METABOLIC PANEL
ALT: 37 U/L — ABNORMAL HIGH (ref 0–35)
AST: 30 U/L (ref 0–37)
Albumin: 3.2 g/dL — ABNORMAL LOW (ref 3.5–5.2)
Alkaline Phosphatase: 74 U/L (ref 39–117)
Anion gap: 9 (ref 5–15)
BILIRUBIN TOTAL: 0.4 mg/dL (ref 0.3–1.2)
BUN: 7 mg/dL (ref 6–23)
CHLORIDE: 107 mmol/L (ref 96–112)
CO2: 23 mmol/L (ref 19–32)
CREATININE: 0.72 mg/dL (ref 0.50–1.10)
Calcium: 7.8 mg/dL — ABNORMAL LOW (ref 8.4–10.5)
GFR calc Af Amer: >90 mL/min (ref >90)
Glucose, Bld: 196 mg/dL — ABNORMAL HIGH (ref 70–99)
Potassium: 2.2 mmol/L — CL (ref 3.5–5.1)
Sodium: 139 mmol/L (ref 135–145)
Total Protein: 6.2 g/dL (ref 6.0–8.3)

## 2014-07-16 LAB — CBC WITH DIFFERENTIAL/PLATELET
BASOS PCT: 0 % (ref 0–1)
Basophils Absolute: 0 10*3/uL (ref 0.0–0.1)
Eosinophils Absolute: 0.1 10*3/uL (ref 0.0–0.7)
Eosinophils Relative: 1 % (ref 0–5)
HCT: 38.5 % (ref 36.0–46.0)
HEMOGLOBIN: 12.6 g/dL (ref 12.0–15.0)
LYMPHS ABS: 0.6 10*3/uL — AB (ref 0.7–4.0)
Lymphocytes Relative: 13 % (ref 12–46)
MCH: 30.6 pg (ref 26.0–34.0)
MCHC: 32.7 g/dL (ref 30.0–36.0)
MCV: 93.4 fL (ref 78.0–100.0)
MONOS PCT: 8 % (ref 3–12)
Monocytes Absolute: 0.4 10*3/uL (ref 0.1–1.0)
Neutro Abs: 3.3 10*3/uL (ref 1.7–7.7)
Neutrophils Relative %: 78 % — ABNORMAL HIGH (ref 43–77)
Platelets: 144 10*3/uL — ABNORMAL LOW (ref 150–400)
RBC: 4.12 MIL/uL (ref 3.87–5.11)
RDW: 16.3 % — ABNORMAL HIGH (ref 11.5–15.5)
WBC: 4.3 10*3/uL (ref 4.0–10.5)

## 2014-07-16 LAB — MAGNESIUM: Magnesium: 2.1 mg/dL (ref 1.5–2.5)

## 2014-07-16 LAB — TSH: TSH: 0.719 u[IU]/mL (ref 0.350–4.500)

## 2014-07-16 MED ORDER — LORAZEPAM 2 MG/ML IJ SOLN
0.5000 mg | Freq: Once | INTRAMUSCULAR | Status: AC
  Administered 2014-07-16: 0.5 mg via INTRAVENOUS

## 2014-07-16 MED ORDER — ENOXAPARIN SODIUM 120 MG/0.8ML ~~LOC~~ SOLN
1.0000 mg/kg | Freq: Two times a day (BID) | SUBCUTANEOUS | Status: DC
  Administered 2014-07-16 – 2014-07-20 (×8): 105 mg via SUBCUTANEOUS
  Filled 2014-07-16 (×14): qty 0.8

## 2014-07-16 MED ORDER — ACETAMINOPHEN 325 MG PO TABS
650.0000 mg | ORAL_TABLET | Freq: Four times a day (QID) | ORAL | Status: DC | PRN
Start: 2014-07-16 — End: 2014-07-20

## 2014-07-16 MED ORDER — POTASSIUM CHLORIDE IN NACL 40-0.9 MEQ/L-% IV SOLN
Freq: Once | INTRAVENOUS | Status: AC
  Administered 2014-07-16: 100 mL/h via INTRAVENOUS
  Filled 2014-07-16: qty 1000

## 2014-07-16 MED ORDER — OCTREOTIDE ACETATE 500 MCG/ML IJ SOLN
INTRAMUSCULAR | Status: AC
  Filled 2014-07-16: qty 1

## 2014-07-16 MED ORDER — POTASSIUM CHLORIDE IN NACL 40-0.9 MEQ/L-% IV SOLN
INTRAVENOUS | Status: DC
  Administered 2014-07-16 – 2014-07-19 (×7): 100 mL/h via INTRAVENOUS

## 2014-07-16 MED ORDER — POTASSIUM CHLORIDE 20 MEQ PO PACK
40.0000 meq | PACK | Freq: Once | ORAL | Status: DC
  Filled 2014-07-16: qty 2

## 2014-07-16 MED ORDER — SERTRALINE HCL 50 MG PO TABS
200.0000 mg | ORAL_TABLET | Freq: Every day | ORAL | Status: DC
  Administered 2014-07-17 – 2014-07-20 (×4): 200 mg via ORAL
  Filled 2014-07-16 (×5): qty 4

## 2014-07-16 MED ORDER — POTASSIUM CHLORIDE 10 MEQ/100ML IV SOLN
10.0000 meq | INTRAVENOUS | Status: AC
  Administered 2014-07-16 (×3): 10 meq via INTRAVENOUS
  Filled 2014-07-16 (×2): qty 100

## 2014-07-16 MED ORDER — MORPHINE SULFATE ER 15 MG PO TBCR
15.0000 mg | EXTENDED_RELEASE_TABLET | Freq: Two times a day (BID) | ORAL | Status: DC
  Administered 2014-07-16 – 2014-07-20 (×8): 15 mg via ORAL
  Filled 2014-07-16 (×8): qty 1

## 2014-07-16 MED ORDER — METRONIDAZOLE IN NACL 5-0.79 MG/ML-% IV SOLN
500.0000 mg | Freq: Three times a day (TID) | INTRAVENOUS | Status: DC
  Administered 2014-07-16 – 2014-07-19 (×9): 500 mg via INTRAVENOUS
  Filled 2014-07-16 (×8): qty 100

## 2014-07-16 MED ORDER — DIPHENOXYLATE-ATROPINE 2.5-0.025 MG PO TABS
2.0000 | ORAL_TABLET | Freq: Four times a day (QID) | ORAL | Status: DC
  Administered 2014-07-16 – 2014-07-19 (×9): 2 via ORAL
  Filled 2014-07-16 (×9): qty 2

## 2014-07-16 MED ORDER — ONDANSETRON HCL 4 MG/2ML IJ SOLN
4.0000 mg | Freq: Four times a day (QID) | INTRAMUSCULAR | Status: DC | PRN
  Administered 2014-07-16 – 2014-07-19 (×5): 4 mg via INTRAVENOUS
  Filled 2014-07-16 (×5): qty 2

## 2014-07-16 MED ORDER — CIPROFLOXACIN IN D5W 400 MG/200ML IV SOLN
400.0000 mg | Freq: Two times a day (BID) | INTRAVENOUS | Status: DC
  Administered 2014-07-16 – 2014-07-19 (×6): 400 mg via INTRAVENOUS
  Filled 2014-07-16 (×6): qty 200

## 2014-07-16 MED ORDER — OCTREOTIDE ACETATE 100 MCG/ML IJ SOLN
150.0000 ug | Freq: Three times a day (TID) | INTRAMUSCULAR | Status: DC
  Administered 2014-07-16 – 2014-07-19 (×8): 150 ug via SUBCUTANEOUS
  Filled 2014-07-16 (×12): qty 1.5

## 2014-07-16 MED ORDER — ACETAMINOPHEN 650 MG RE SUPP: 650.0000 mg | Freq: Four times a day (QID) | RECTAL | Status: DC | PRN

## 2014-07-16 MED ORDER — LORAZEPAM 0.5 MG PO TABS
0.5000 mg | ORAL_TABLET | Freq: Four times a day (QID) | ORAL | Status: DC | PRN
  Administered 2014-07-19 (×2): 0.5 mg via ORAL
  Filled 2014-07-16 (×2): qty 1

## 2014-07-16 MED ORDER — SODIUM CHLORIDE 0.9 % IJ SOLN
3.0000 mL | Freq: Two times a day (BID) | INTRAMUSCULAR | Status: DC
  Administered 2014-07-16 – 2014-07-20 (×5): 3 mL via INTRAVENOUS

## 2014-07-16 MED ORDER — MORPHINE SULFATE 2 MG/ML IJ SOLN
2.0000 mg | INTRAMUSCULAR | Status: DC | PRN
  Administered 2014-07-16 – 2014-07-18 (×8): 2 mg via INTRAVENOUS
  Filled 2014-07-16 (×8): qty 1

## 2014-07-16 MED ORDER — TAMOXIFEN CITRATE 10 MG PO TABS
20.0000 mg | ORAL_TABLET | Freq: Every day | ORAL | Status: DC
  Administered 2014-07-17 – 2014-07-20 (×4): 20 mg via ORAL
  Filled 2014-07-16 (×5): qty 2

## 2014-07-16 MED ORDER — ONDANSETRON HCL 4 MG PO TABS
4.0000 mg | ORAL_TABLET | Freq: Four times a day (QID) | ORAL | Status: DC | PRN
  Administered 2014-07-16: 4 mg via ORAL
  Filled 2014-07-16: qty 1

## 2014-07-16 MED ORDER — SODIUM CHLORIDE 0.9 % IV SOLN: INTRAVENOUS | Status: DC

## 2014-07-16 MED ORDER — OCTREOTIDE ACETATE 50 MCG/ML IJ SOLN
150.0000 ug | Freq: Once | INTRAMUSCULAR | Status: AC
  Administered 2014-07-16: 150 ug via SUBCUTANEOUS
  Filled 2014-07-16: qty 3

## 2014-07-16 MED ORDER — PROCHLORPERAZINE MALEATE 5 MG PO TABS
10.0000 mg | ORAL_TABLET | Freq: Four times a day (QID) | ORAL | Status: DC | PRN
  Administered 2014-07-17: 10 mg via ORAL
  Filled 2014-07-16 (×2): qty 2

## 2014-07-16 MED ORDER — LORAZEPAM 2 MG/ML IJ SOLN
INTRAMUSCULAR | Status: AC
  Filled 2014-07-16: qty 1

## 2014-07-16 MED ORDER — POTASSIUM CHLORIDE CRYS ER 20 MEQ PO TBCR: 40.0000 meq | EXTENDED_RELEASE_TABLET | Freq: Once | ORAL | Status: DC

## 2014-07-16 MED ORDER — ENOXAPARIN SODIUM 150 MG/ML ~~LOC~~ SOLN
1.0000 mg/kg | Freq: Two times a day (BID) | SUBCUTANEOUS | Status: DC
Start: 2014-07-16 — End: 2014-07-16

## 2014-07-16 MED ORDER — OXYCODONE HCL 5 MG PO TABS
5.0000 mg | ORAL_TABLET | ORAL | Status: DC | PRN
  Administered 2014-07-17 – 2014-07-19 (×5): 5 mg via ORAL
  Filled 2014-07-16 (×6): qty 1

## 2014-07-16 MED ORDER — ONDANSETRON HCL 40 MG/20ML IJ SOLN
8.0000 mg | Freq: Once | INTRAMUSCULAR | Status: AC
  Administered 2014-07-16: 8 mg via INTRAVENOUS
  Filled 2014-07-16: qty 4

## 2014-07-16 MED ORDER — LOPERAMIDE HCL 2 MG PO CAPS
2.0000 mg | ORAL_CAPSULE | ORAL | Status: DC | PRN
  Administered 2014-07-16: 2 mg via ORAL
  Filled 2014-07-16: qty 1

## 2014-07-16 MED ORDER — PANTOPRAZOLE SODIUM 40 MG IV SOLR
40.0000 mg | INTRAVENOUS | Status: DC
  Administered 2014-07-16 – 2014-07-20 (×5): 40 mg via INTRAVENOUS
  Filled 2014-07-16 (×5): qty 40

## 2014-07-16 MED ORDER — POTASSIUM CHLORIDE CRYS ER 20 MEQ PO TBCR
40.0000 meq | EXTENDED_RELEASE_TABLET | ORAL | Status: AC
  Administered 2014-07-16 (×2): 40 meq via ORAL
  Filled 2014-07-16 (×2): qty 2

## 2014-07-16 MED ORDER — ZOLPIDEM TARTRATE 5 MG PO TABS
5.0000 mg | ORAL_TABLET | Freq: Every day | ORAL | Status: DC
  Administered 2014-07-16 – 2014-07-19 (×4): 5 mg via ORAL
  Filled 2014-07-16 (×4): qty 1

## 2014-07-16 MED ORDER — POTASSIUM CHLORIDE 10 MEQ/100ML IV SOLN
10.0000 meq | INTRAVENOUS | Status: AC
  Administered 2014-07-16: 10 meq via INTRAVENOUS
  Filled 2014-07-16 (×2): qty 100

## 2014-07-16 NOTE — Progress Notes (Signed)
ANTICOAGULATION CONSULT NOTE - Initial Consult  Pharmacy Consult for Lovenox Indication: VTE Treatment  No Known Allergies  Patient Measurements: Height: 5\' 8"  (172.7 cm) Weight: 231 lb 8 oz (105.008 kg) IBW/kg (Calculated) : 63.9 Heparin Dosing Weight:   Vital Signs: Temp: 98.8 F (37.1 C) (03/07 1522) Temp Source: Oral (03/07 1522) BP: 123/62 mmHg (03/07 1522) Pulse Rate: 79 (03/07 1522)  Labs:  Recent Labs  07/16/14 1232  HGB 12.6  HCT 38.5  PLT 144*  CREATININE 0.72    Estimated Creatinine Clearance: 105.5 mL/min (by C-G formula based on Cr of 0.72).   Medical History: Past Medical History  Diagnosis Date  . PONV (postoperative nausea and vomiting)     pt states scope patch does well  . Anemia   . Depression   . Anxiety   . Headache(784.0)     has migraines - medication controls  . Breast cancer 04/2014    Presumed dx; stage 4 w/ mets to bone and liver  . Bony metastasis     Medications:  Scheduled:  . diphenoxylate-atropine  2 tablet Oral QID  . enoxaparin (LOVENOX) injection  1 mg/kg Subcutaneous Q12H  . potassium chloride  10 mEq Intravenous Q1 Hr x 4    Assessment: 49 year old female with stage IV breast cancer presents with worsening pain in the right chest and neck, significant swelling. Ultrasound (07/09/14) showed DVT of the internal jugular and subclavian. Patient has been started on Lovenox PTA. Last dose today at 10 AM, per patient. Excellent renal function  Goal of Therapy:  Anti-Xa level 0.6-1 units/ml 4hrs after LMWH dose given Monitor platelets by anticoagulation protocol: Yes   Plan:  Lovenox 105 mg SQ (1 mg/kg) every 12 hours, next dose 10 PM Monitor renal function Monitor CBC, platelets, bleeding  Abner Greenspan, Aracelly Tencza Bennett 07/16/2014,4:04 PM

## 2014-07-16 NOTE — Patient Instructions (Addendum)
[place] at [facility] Discharge Instructions  RECOMMENDATIONS MADE BY THE CONSULTANT AND ANY TEST RESULTS WILL BE SENT TO YOUR REFERRING PHYSICIAN.  Today you were given 1 liter of Normal Saline with Potassium, Zofran, Ativan, and Sandostatin (for diarrhea)  We checked your labs: Complete Blood Count, Complete Metabolic Panel, C-diff and white blood cells in your stool  We also checked your vital signs sitting and standing.    Thank you for choosing [place] at [facility] to provide your oncology and hematology care.  To afford each patient quality time with our provider, please arrive at least 15 minutes before your scheduled appointment time.    You need to re-schedule your appointment should you arrive 10 or more minutes late.  We strive to give you quality time with our providers, and arriving late affects you and other patients whose appointments are after yours.  Also, if you no show three or more times for appointments you may be dismissed from the clinic at the providers discretion.     Again, thank you for choosing [facility].  Our hope is that these requests will decrease the amount of time that you wait before being seen by our physicians.       _____________________________________________________________  Should you have questions after your visit to [facility], please contact our office at [phone] between the hours of 8:30 a.m. and 4:30 p.m.  Voicemails left after 4:30 p.m. will not be returned until the following business day.  For prescription refill requests, have your pharmacy contact our office.

## 2014-07-16 NOTE — Progress Notes (Signed)
[name] presents today for injection per MD orders. Sandostatin 1103mcg administered SQ in right Abdomen. Administration without incident. Patient tolerated well.  CRITICAL VALUE ALERT Critical value received:  K 2.2 Date of notification:  07/16/14 Time of notification: 1400 Critical value read back:  Yes.   Nurse who received alert:  Isidoro Donning RN Tom & Dr. Whitney Muse notified. 2 runs of Potassium ordered  Pt taken to 318 via wheelchair. Patient assisted into bed. Nurse present with patient when I left patient in room. Patient stable and appreciative for what we did for her in the [place] today. 2nd bag (run) of Potassium given to patient's nurse Di Kindle.

## 2014-07-16 NOTE — H&P (Signed)
[facility] History and Physical  [name] KGM:[id number] DOB: 1965/09/19 DOA: 07/16/2014  Referring physician: Dr. Whitney Muse PCP: [name], MD   Chief Complaint: weakness  HPI: [name] is a 49 y.o. female with history of stage IV breast cancer, currently undergoing chemotherapy, presented to the oncology clinic today for follow-up. Patient reported that for the past 2-3 weeks she's been having persistent diarrhea. She reports having between 10 and 20 bowel movements a day. These are liquid, large volume. Initially began as brown in color, progressed to green and now yellow. Denies any melena or hematochezia. She had some nausea over the past day or 2, but no vomiting. She does describe periumbilical and hypogastric abdominal pain. She denies any fever or sick contacts. She was evaluated in the emergency room on 2/28 for CT scan of the abdomen and pelvis indicated evidence of enteritis. She denies any cough, chest pain. She did feel short of breath over the past few days while showering. She was evaluated in the oncology clinic today where she was noted to be generally weak, orthostatic, and found to have a low potassium at 2.2. Direct admission was requested for management of her dehydration, hypokalemia and diarrhea.   Review of Systems:  Pertinent positives as per HPI, otherwise negative  Past Medical History  Diagnosis Date  . PONV (postoperative nausea and vomiting)     pt states scope patch does well  . Anemia   . Depression   . Anxiety   . Headache(784.0)     has migraines - medication controls  . Breast cancer 04/2014    Presumed dx; stage 4 w/ mets to bone and liver  . Bony metastasis    Past Surgical History  Procedure Laterality Date  . Right rotator cuff      2002  . Neck fusion      2003  . Laparoscopic cholecystectomy      2004  . Right knee arthroscopy      2005  . Colon surgery      2008  . Appendectomy      2008  . Abdominal  hysterectomy    . Breast reduction surgery  03/17/2011    Procedure: MAMMARY REDUCTION BILATERAL (BREAST);  Surgeon: Mary A Contogiannis;  Location: [place];  Service: Plastics;  Laterality: Bilateral;  . Colonoscopy N/A 07/13/2013    Procedure: COLONOSCOPY;  Surgeon: Rogene Houston, MD;  [facility]: [facility];  Service: Endoscopy;  Laterality: N/A;  930  . Liver biopsy  04/2014  . Portacath placement    . Esophagogastroduodenoscopy N/A 05/25/2014    Procedure: ESOPHAGOGASTRODUODENOSCOPY (EGD);  Surgeon: Rogene Houston, MD;  Location: [facility];  Service: Endoscopy;  Laterality: N/A;  155   Social History:  reports that she has quit smoking. Her smoking use included Cigarettes. She has never used smokeless tobacco. She reports that she drinks alcohol. She reports that she does not use illicit drugs.  No Known Allergies  Family History  Problem Relation Age of Onset  . Colon cancer Neg Hx   . Diabetes Father   . Heart attack Maternal Grandmother 30    multiple over lifetime.    Prior to Admission medications   Medication Sig Start Date End Date Taking? Authorizing Provider  acetaminophen (TYLENOL) 325 MG tablet Take 650 mg by mouth every 6 (six) hours as needed for mild pain or fever.    Yes Historical Provider, MD  Alum & Mag Hydroxide-Simeth (MAGIC  MOUTHWASH) SOLN Take 10 mLs by mouth 4 (four) times daily as needed for mouth pain. 06/19/14  Yes Rexene Alberts, MD  Calcium-Phosphorus-Vitamin D (CALCIUM/D3 ADULT GUMMIES PO) Take by mouth. Patient taking 4 gummies a day to equal her dosage of calcium 1200mg  and vitamin d 1000mg    Yes Historical Provider, MD  chlorhexidine (PERIDEX) 0.12 % solution Use as directed 15 mLs in the mouth or throat 4 (four) times daily. Patient taking differently: Use as directed 15 mLs in the mouth or throat 4 (four) times daily as needed (mouth/throat).  06/19/14  Yes Rexene Alberts, MD  dexamethasone (DECADRON) 4 MG tablet 2 tablet bid  starting day before chemotherapy, day of and day after Patient taking differently: Take 4 mg by mouth See admin instructions. 2 tablet bid starting day before chemotherapy, day of and day after 06/12/14  Yes Patrici Ranks, MD  diphenhydrAMINE (BENADRYL) 25 mg capsule Take 25 mg by mouth every 6 (six) hours as needed for itching.   Yes Historical Provider, MD  diphenoxylate-atropine (LOMOTIL) 2.5-0.025 MG per tablet May take 1-2 tablets four times a day as needed for diarrhea. 07/12/14  Yes Manon Hilding Kefalas, PA-C  enoxaparin (LOVENOX) 150 MG/ML injection Inject 0.74 mLs (110 mg total) into the skin every 12 (twelve) hours. 07/09/14  Yes Patrici Ranks, MD  lidocaine (XYLOCAINE) 2 % solution Use as directed 5 mLs in the mouth or throat 4 (four) times daily as needed for mouth pain. 06/19/14  Yes Rexene Alberts, MD  lidocaine-prilocaine (EMLA) cream Apply 1 application topically daily as needed (apply to port before chemo).  05/17/14  Yes Historical Provider, MD  LORazepam (ATIVAN) 0.5 MG tablet Take 1 tablet (0.5 mg total) by mouth every 6 (six) hours as needed. anxiety/nausea 07/06/14  Yes Patrici Ranks, MD  morphine (MS CONTIN) 15 MG 12 hr tablet Take 1 tablet (15 mg total) by mouth every 12 (twelve) hours. 07/03/14  Yes Patrici Ranks, MD  ondansetron (ZOFRAN) 8 MG tablet Take 1 tablet (8 mg total) by mouth every 8 (eight) hours as needed for nausea or vomiting. 05/17/14  Yes Patrici Ranks, MD  oxyCODONE (OXY IR/ROXICODONE) 5 MG immediate release tablet 5 mg every 4 (four) hours as needed.  06/29/14  Yes Historical Provider, MD  pantoprazole (PROTONIX) 40 MG tablet Take 1 tablet (40 mg total) by mouth daily. 05/16/14  Yes Patrici Ranks, MD  polyethylene glycol (MIRALAX / GLYCOLAX) packet Take 17 g by mouth daily as needed for mild constipation.   Yes Historical Provider, MD  Potassium Bicarb-Citric Acid ([name]) 20 MEQ TBEF Take 1 tablet (20 mEq total) by mouth 2 (two) times daily. 07/09/14  Yes  Patrici Ranks, MD  prochlorperazine (COMPAZINE) 10 MG tablet Take 1 tablet (10 mg total) by mouth every 6 (six) hours as needed for nausea or vomiting. 07/05/14  Yes Patrici Ranks, MD  sertraline (ZOLOFT) 100 MG tablet Take 2 tablets (200 mg total) by mouth daily. 06/12/14  Yes Patrici Ranks, MD  sodium chloride (OCEAN) 0.65 % SOLN nasal spray Place 1 spray into both nostrils as needed for congestion. 06/19/14  Yes Rexene Alberts, MD  SUMAtriptan (IMITREX) 100 MG tablet Take 1 tablet (100 mg total) by mouth as needed. foir migraine 07/03/14  Yes Patrici Ranks, MD  tamoxifen (NOLVADEX) 20 MG tablet Take 1 tablet (20 mg total) by mouth daily. 05/09/14  Yes Patrici Ranks, MD  zolpidem (AMBIEN) 10 MG tablet Take 1  tablet (10 mg total) by mouth at bedtime as needed for sleep. Patient taking differently: Take 10 mg by mouth at bedtime.  06/12/14 07/16/14 Yes Patrici Ranks, MD   Physical Exam: Filed Vitals:   07/16/14 1522  BP: 123/62  Pulse: 79  Temp: 98.8 F (37.1 C)  TempSrc: Oral  Resp: 20  Height: 5\' 8"  (1.727 m)  Weight: 105.008 kg (231 lb 8 oz)  SpO2: 96%    Wt Readings from Last 3 Encounters:  07/16/14 105.008 kg (231 lb 8 oz)  07/16/14 105.008 kg (231 lb 8 oz)  07/13/14 106.505 kg (234 lb 12.8 oz)    General:  Appears calm and comfortable Eyes: PERRL, normal lids, irises & conjunctiva ENT: grossly normal hearing, lips & tongue Neck: no LAD, masses or thyromegaly Cardiovascular: RRR, no m/r/g. No LE edema. Telemetry: SR, no arrhythmias  Respiratory: CTA bilaterally, no w/r/r. Normal respiratory effort. Abdomen: soft, diffusely tender, but more tender in periumbilical area, bs+ Skin: no rash or induration seen on limited exam Musculoskeletal: grossly normal tone BUE/BLE Psychiatric: grossly normal mood and affect, speech fluent and appropriate Neurologic: grossly non-focal.          Labs on Admission:  Basic Metabolic Panel:  Recent Labs Lab 07/16/14 1232   NA 139  K 2.2*  CL 107  CO2 23  GLUCOSE 196*  BUN 7  CREATININE 0.72  CALCIUM 7.8*   Liver Function Tests:  Recent Labs Lab 07/16/14 1232  AST 30  ALT 37*  ALKPHOS 74  BILITOT 0.4  PROT 6.2  ALBUMIN 3.2*   No results for input(s): LIPASE, AMYLASE in the last 168 hours. No results for input(s): AMMONIA in the last 168 hours. CBC:  Recent Labs Lab 07/16/14 1232  WBC 4.3  NEUTROABS 3.3  HGB 12.6  HCT 38.5  MCV 93.4  PLT 144*   Cardiac Enzymes: No results for input(s): CKTOTAL, CKMB, CKMBINDEX, TROPONINI in the last 168 hours.  BNP (last 3 results) No results for input(s): BNP in the last 8760 hours.  ProBNP (last 3 results) No results for input(s): PROBNP in the last 8760 hours.  CBG: No results for input(s): GLUCAP in the last 168 hours.  Radiological Exams on Admission: No results found.   Assessment/Plan Active Problems:   Hypokalemia   Breast cancer, stage 4   Diarrhea   DVT (deep venous thrombosis)   Dehydration   Orthostatic hypotension   GERD (gastroesophageal reflux disease)   1. Diarrhea. Possibly related to recent chemotherapy. Her last CT scan approximately one week ago indicated evidence of enteritis. Stool studies have been ordered. Will empirically start her on ciprofloxacin and Flagyl until stool studies are returned negative. She is currently on antidiarrheals. If she fails to improve, may need gastroenterology input 2. Dehydration. Related to #1. Will rehydrate with IV fluids. 3. Orthostatic hypotension. Related to #1. IV fluids. 4. Hypokalemia. We'll aggressively replace. Monitor on telemetry. Check magnesium. 5. Stage IV breast cancer. Patient to follow-up with oncology for further treatments. 6. History of DVT. Patient is on Lovenox as an outpatient. This will be continued.   Code Status: full code DVT Prophylaxis: on therapeutic lovenox Family Communication: discussed with patient Disposition Plan: discharge home once  improved  Time spent: 20mins  MEMON,JEHANZEB [facility] Pager [phone]

## 2014-07-17 DIAGNOSIS — C50812 Malignant neoplasm of overlapping sites of left female breast: Secondary | ICD-10-CM

## 2014-07-17 DIAGNOSIS — I82C19 Acute embolism and thrombosis of unspecified internal jugular vein: Secondary | ICD-10-CM

## 2014-07-17 DIAGNOSIS — C787 Secondary malignant neoplasm of liver and intrahepatic bile duct: Secondary | ICD-10-CM

## 2014-07-17 DIAGNOSIS — C7951 Secondary malignant neoplasm of bone: Secondary | ICD-10-CM

## 2014-07-17 DIAGNOSIS — I82B19 Acute embolism and thrombosis of unspecified subclavian vein: Secondary | ICD-10-CM

## 2014-07-17 LAB — CBC
HEMATOCRIT: 34.5 % — AB (ref 36.0–46.0)
HEMOGLOBIN: 11.2 g/dL — AB (ref 12.0–15.0)
MCH: 30.7 pg (ref 26.0–34.0)
MCHC: 32.5 g/dL (ref 30.0–36.0)
MCV: 94.5 fL (ref 78.0–100.0)
Platelets: 143 10*3/uL — ABNORMAL LOW (ref 150–400)
RBC: 3.65 MIL/uL — ABNORMAL LOW (ref 3.87–5.11)
RDW: 16.7 % — ABNORMAL HIGH (ref 11.5–15.5)
WBC: 3.4 10*3/uL — ABNORMAL LOW (ref 4.0–10.5)

## 2014-07-17 LAB — COMPREHENSIVE METABOLIC PANEL
ALK PHOS: 63 U/L (ref 39–117)
ALT: 40 U/L — ABNORMAL HIGH (ref 0–35)
ANION GAP: 5 (ref 5–15)
AST: 30 U/L (ref 0–37)
Albumin: 2.6 g/dL — ABNORMAL LOW (ref 3.5–5.2)
BUN: 5 mg/dL — ABNORMAL LOW (ref 6–23)
CHLORIDE: 112 mmol/L (ref 96–112)
CO2: 24 mmol/L (ref 19–32)
CREATININE: 0.54 mg/dL (ref 0.50–1.10)
Calcium: 7 mg/dL — ABNORMAL LOW (ref 8.4–10.5)
GLUCOSE: 133 mg/dL — AB (ref 70–99)
POTASSIUM: 3.6 mmol/L (ref 3.5–5.1)
Sodium: 141 mmol/L (ref 135–145)
Total Bilirubin: 0.2 mg/dL — ABNORMAL LOW (ref 0.3–1.2)
Total Protein: 5.2 g/dL — ABNORMAL LOW (ref 6.0–8.3)

## 2014-07-17 LAB — URINALYSIS, ROUTINE W REFLEX MICROSCOPIC
BILIRUBIN URINE: NEGATIVE
GLUCOSE, UA: NEGATIVE mg/dL
Hgb urine dipstick: NEGATIVE
KETONES UR: NEGATIVE mg/dL
Leukocytes, UA: NEGATIVE
Nitrite: NEGATIVE
PH: 5.5 (ref 5.0–8.0)
Protein, ur: NEGATIVE mg/dL
Specific Gravity, Urine: >1.03 — ABNORMAL HIGH (ref 1.005–1.030)
Urobilinogen, UA: 0.2 mg/dL (ref 0.0–1.0)

## 2014-07-17 LAB — FECAL LACTOFERRIN, QUANT: FECAL LACTOFERRIN: POSITIVE

## 2014-07-17 LAB — OVA AND PARASITE EXAMINATION: OVA AND PARASITES: NONE SEEN

## 2014-07-17 MED ORDER — OCTREOTIDE ACETATE 100 MCG/ML IJ SOLN
INTRAMUSCULAR | Status: AC
  Filled 2014-07-17: qty 2

## 2014-07-17 MED ORDER — SACCHAROMYCES BOULARDII 250 MG PO CAPS
250.0000 mg | ORAL_CAPSULE | Freq: Two times a day (BID) | ORAL | Status: DC
  Administered 2014-07-17 – 2014-07-20 (×7): 250 mg via ORAL
  Filled 2014-07-17 (×7): qty 1

## 2014-07-17 MED ORDER — GI COCKTAIL ~~LOC~~
30.0000 mL | Freq: Three times a day (TID) | ORAL | Status: DC | PRN
  Administered 2014-07-17 – 2014-07-18 (×2): 30 mL via ORAL
  Filled 2014-07-17 (×2): qty 30

## 2014-07-17 NOTE — Progress Notes (Signed)
[facility] PROGRESS NOTE  [name] Jared HQI:[id number] DOB: 22-Feb-1966 DOA: 07/16/2014 PCP: [name], MD  Assessment/Plan: 1. Diarrhea. C. difficile found to be negative. CT scan of abdomen from approximately 1 week ago indicated evidence of enteritis. Remainder stool studies are currently pending. Fecal leukocytes are positive. Ova/parasites, GI pathogen panel are in process. She is currently on antibiotics. Continue antidiarrheals. Chemotherapy agent could certainly be playing a role. Appreciate oncology input. Will request GI consultation for further management. 2. Dehydration. Improved with IV fluids 3. Orthostatic hypotension. Improving with hydration 4. Hypokalemia. Improve. Magnesium normal 5. Stage IV breast cancer. Further follow-up with oncology 6. History of DVT in the neck, currently on Lovenox  Code Status: full code Family Communication: discussed with patient and husband at the bedside Disposition Plan: discharge home once improve   Consultants:  oncology  Procedures:    Antibiotics:  cipro 3/7  Flagyl 3/7  HPI/Subjective: She continues to have frequent stools. Nausea appears to be improving. Generalized weakness is better  Objective: Filed Vitals:   07/17/14 1311  BP: 110/65  Pulse: 76  Temp: 98 F (36.7 C)  Resp: 20    Intake/Output Summary (Last 24 hours) at 07/17/14 1850 Last data filed at 07/17/14 1312  Gross per 24 hour  Intake    300 ml  Output    500 ml  Net   -200 ml   Filed Weights   07/16/14 1522  Weight: 105.008 kg (231 lb 8 oz)    Exam:   General:  NAD  Cardiovascular: S1, S2 RRR  Respiratory: CTA B  Abdomen: soft, diffuse tenderness, but more in periumbilical area bs+  Musculoskeletal: no edema b/l   Data Reviewed: Basic Metabolic Panel:  Recent Labs Lab 07/16/14 1232 07/17/14 0645  NA 139 141  K 2.2* 3.6  CL 107 112  CO2 23 24  GLUCOSE 196* 133*  BUN 7 5*  CREATININE 0.72 0.54  CALCIUM 7.8*  7.0*  MG 2.1  --    Liver Function Tests:  Recent Labs Lab 07/16/14 1232 07/17/14 0645  AST 30 30  ALT 37* 40*  ALKPHOS 74 63  BILITOT 0.4 0.2*  PROT 6.2 5.2*  ALBUMIN 3.2* 2.6*   No results for input(s): LIPASE, AMYLASE in the last 168 hours. No results for input(s): AMMONIA in the last 168 hours. CBC:  Recent Labs Lab 07/16/14 1232 07/17/14 0645  WBC 4.3 3.4*  NEUTROABS 3.3  --   HGB 12.6 11.2*  HCT 38.5 34.5*  MCV 93.4 94.5  PLT 144* 143*   Cardiac Enzymes: No results for input(s): CKTOTAL, CKMB, CKMBINDEX, TROPONINI in the last 168 hours. BNP (last 3 results) No results for input(s): BNP in the last 8760 hours.  ProBNP (last 3 results) No results for input(s): PROBNP in the last 8760 hours.  CBG: No results for input(s): GLUCAP in the last 168 hours.  Recent Results (from the past 240 hour(s))  Clostridium Difficile by PCR     Status: None   Collection Time: 07/16/14  1:30 PM  Result Value Ref Range Status   C difficile by pcr NEGATIVE NEGATIVE Final  Ova and parasite examination     Status: None   Collection Time: 07/16/14  1:30 PM  Result Value Ref Range Status   Specimen Description STOOL  Final   Special Requests NONE  Final   Ova and parasites   Final    NO OVA OR PARASITES SEEN Performed at [facility]  Report Status 07/17/2014 FINAL  Final     Studies: No results found.  Scheduled Meds: . ciprofloxacin  400 mg Intravenous Q12H  . diphenoxylate-atropine  2 tablet Oral QID  . enoxaparin (LOVENOX) injection  1 mg/kg Subcutaneous Q12H  . metronidazole  500 mg Intravenous Q8H  . morphine  15 mg Oral Q12H  . octreotide  150 mcg Subcutaneous 3 times per day  . pantoprazole (PROTONIX) IV  40 mg Intravenous Q24H  . saccharomyces boulardii  250 mg Oral BID  . sertraline  200 mg Oral Daily  . sodium chloride  3 mL Intravenous Q12H  . tamoxifen  20 mg Oral Daily  . zolpidem  5 mg Oral QHS   Continuous Infusions: . 0.9 % NaCl with  KCl 40 mEq / L 100 mL/hr (07/17/14 1034)    Active Problems:   Hypokalemia   Breast cancer, stage 4   Diarrhea   DVT (deep venous thrombosis)   Dehydration   Orthostatic hypotension   GERD (gastroesophageal reflux disease)    Time spent: 36mins    Juniper Cobey  [facility] Pager [phone]. If 7PM-7AM, please contact night-coverage at [email or web address], password [facility] 07/17/2014, 6:50 PM  LOS: 1 day

## 2014-07-17 NOTE — Consult Note (Signed)
Inpatient Hematology/Oncology Consultation   Name: [name]      MRN: [id number]    Location: [facility]318/A318-01  Date: 07/17/2014 Time:5:27 PM   REFERRING PHYSICIAN:  Rexene Alberts MD REASON FOR CONSULT: Diarrhea, hypokalemia    DIAGNOSIS:  Diarrhea   Hypokalemia   Stage IV breast cancer   Bone Metastases/Liver metastases   HISTORY OF PRESENT ILLNESS:  49 year old female well known to me with Stage IV breast cancer currently on Tamoxifen Perjeta, Herceptin. She presented to the clinic yesterday with severe diarrhea. She developed diarrhea after her last treatment with Herceptin and Perjeta. Unfortunately in spite of using Imodium and Lomotil she has been unable to control her diarrhea. C. difficile was obtained and was negative. She has had no problems with fever. She came to the clinic yesterday for IV hydration and additional evaluation and also had significant hypokalemia. She was admitted for further management.  She reports having up to 20 bowel movements in a day.  She notes that she has only had 5 today. She reports that she feels better, her energy is improved. She states she was advised that gastroenterology has been consulted as well.  She has had multiple complications since starting treatment including, neutropenic fever, pain requiring XRT, IJ DVT and now diarrhea.   PAST MEDICAL HISTORY:   Past Medical History  Diagnosis Date  . PONV (postoperative nausea and vomiting)     pt states scope patch does well  . Anemia   . Depression   . Anxiety   . Headache(784.0)     has migraines - medication controls  . Breast cancer 04/2014    Presumed dx; stage 4 w/ mets to bone and liver  . Bony metastasis     ALLERGIES: No Known Allergies    MEDICATIONS: I have reviewed the patient's current medications.     PAST SURGICAL HISTORY Past Surgical History  Procedure Laterality Date  . Right rotator cuff      2002  . Neck fusion      2003  . Laparoscopic  cholecystectomy      2004  . Right knee arthroscopy      2005  . Colon surgery      2008  . Appendectomy      2008  . Abdominal hysterectomy    . Breast reduction surgery  03/17/2011    Procedure: MAMMARY REDUCTION BILATERAL (BREAST);  Surgeon: Mary A Contogiannis;  Location: [place];  Service: Plastics;  Laterality: Bilateral;  . Colonoscopy N/A 07/13/2013    Procedure: COLONOSCOPY;  Surgeon: Rogene Houston, MD;  [facility];  Service: Endoscopy;  Laterality: N/A;  930  . Liver biopsy  04/2014  . Portacath placement    . Esophagogastroduodenoscopy N/A 05/25/2014    Procedure: ESOPHAGOGASTRODUODENOSCOPY (EGD);  Surgeon: Rogene Houston, MD;  [facility];  Service: Endoscopy;  Laterality: N/A;  155    FAMILY HISTORY: Family History  Problem Relation Age of Onset  . Colon cancer Neg Hx   . Diabetes Father   . Heart attack Maternal Grandmother 30    multiple over lifetime.    SOCIAL HISTORY:  reports that she has quit smoking. Her smoking use included Cigarettes. She has never used smokeless tobacco. She reports that she drinks alcohol. She reports that she does not use illicit drugs.  PERFORMANCE STATUS: The patient's performance status is 1 - Symptomatic but completely ambulatory  PHYSICAL EXAM: Most Recent Vital  Signs: Blood pressure 110/65, pulse 76, temperature 98 F (36.7 C), temperature source Oral, resp. rate 20, height '5\' 8"'  (1.727 m), weight 231 lb 8 oz (105.008 kg), SpO2 98 %. General appearance: alert, cooperative and no distress Head: Normocephalic, without obvious abnormality, atraumatic Throat: lips, mucosa, and tongue normal; teeth and gums normal and mild mucositis Lungs: clear to auscultation bilaterally Breasts: normal appearance, no masses or tenderness Abdomen: soft, diffuse abdominal tenderness on palpation, no guarding or rebound; bowel sounds normal; no masses,  no organomegaly Extremities: extremities normal,  atraumatic, no cyanosis or edema Skin: Skin color, texture, turgor normal. No rashes or lesions Lymph nodes: Cervical, supraclavicular, and axillary nodes normal. Neurologic: Alert and oriented X 3, normal strength and tone. Normal symmetric reflexes. Normal coordination and gait  LABORATORY DATA:  Results for orders placed or performed during the hospital encounter of 07/16/14 (from the past 48 hour(s))  Magnesium     Status: None   Collection Time: 07/16/14 12:32 PM  Result Value Ref Range   Magnesium 2.1 1.5 - 2.5 mg/dL  Ova and parasite examination     Status: None   Collection Time: 07/16/14  1:30 PM  Result Value Ref Range   Specimen Description STOOL    Special Requests NONE    Ova and parasites      NO OVA OR PARASITES SEEN Performed at [facility]    Report Status 07/17/2014 FINAL   TSH     Status: None   Collection Time: 07/16/14  9:34 PM  Result Value Ref Range   TSH 0.719 0.350 - 4.500 uIU/mL  Comprehensive metabolic panel     Status: Abnormal   Collection Time: 07/17/14  6:45 AM  Result Value Ref Range   Sodium 141 135 - 145 mmol/L   Potassium 3.6 3.5 - 5.1 mmol/L    Comment: DELTA CHECK NOTED   Chloride 112 96 - 112 mmol/L   CO2 24 19 - 32 mmol/L   Glucose, Bld 133 (H) 70 - 99 mg/dL   BUN 5 (L) 6 - 23 mg/dL   Creatinine, Ser 0.54 0.50 - 1.10 mg/dL   Calcium 7.0 (L) 8.4 - 10.5 mg/dL   Total Protein 5.2 (L) 6.0 - 8.3 g/dL   Albumin 2.6 (L) 3.5 - 5.2 g/dL   AST 30 0 - 37 U/L   ALT 40 (H) 0 - 35 U/L   Alkaline Phosphatase 63 39 - 117 U/L   Total Bilirubin 0.2 (L) 0.3 - 1.2 mg/dL   GFR calc non Af Amer >90 >90 mL/min   GFR calc Af Amer >90 >90 mL/min    Comment: (NOTE) The eGFR has been calculated using the CKD EPI equation. This calculation has not been validated in all clinical situations. eGFR's persistently <90 mL/min signify possible Chronic Kidney Disease.    Anion gap 5 5 - 15  CBC     Status: Abnormal   Collection Time: 07/17/14  6:45 AM    Result Value Ref Range   WBC 3.4 (L) 4.0 - 10.5 K/uL   RBC 3.65 (L) 3.87 - 5.11 MIL/uL   Hemoglobin 11.2 (L) 12.0 - 15.0 g/dL   HCT 34.5 (L) 36.0 - 46.0 %   MCV 94.5 78.0 - 100.0 fL   MCH 30.7 26.0 - 34.0 pg   MCHC 32.5 30.0 - 36.0 g/dL   RDW 16.7 (H) 11.5 - 15.5 %   Platelets 143 (L) 150 - 400 K/uL        ASSESSMENT:  Stage IV breast cancer Diarrhea, most likely secondary to Perjeta Hypokalemia secondary to above DVT neck, on lovenox  PLAN:  Continue current management, agree with antibiotics for now. Continue Lomotil and octreotide and immodium. Symptoms are currently improved. Stool positive for lactoferrin and negative for C. Difficile. GI pathogen panel is pending.  Agree with GI consult, however I still believe that her severe diarrhea could be from recent therapy with Perjeta.  Her hypokalemia is improved. I would recommend continuing to monitor. We will continue to follow throughout the remainder of her admission.   Molli Hazard MD

## 2014-07-17 NOTE — Progress Notes (Signed)
UR chart review completed.  

## 2014-07-17 NOTE — Progress Notes (Signed)
Present with patient for emotional and spiritual support. Patient was processing the many things she has been facing with her diagnosis/her recent health. She has strong family support and is open to sharing about her challenges with genuineness and honesty and courage,  Prayed together with her and her husband.

## 2014-07-17 NOTE — Progress Notes (Signed)
Inpatient Diabetes Program Recommendations  AACE/ADA: New Consensus Statement on Inpatient Glycemic Control (2013)  Target Ranges:  Prepandial:   less than 140 mg/dL      Peak postprandial:   less than 180 mg/dL (1-2 hours)      Critically ill patients:  140 - 180 mg/dL   Results for [name], [name] (MRN [id number]) as of 07/17/2014 08:33  Ref. Range 07/16/2014 12:32 07/17/2014 06:45  Glucose Latest Range: 70-99 mg/dL 196 (H) 133 (H)    Diabetes history:No Outpatient Diabetes medications: NA Current orders for Inpatient glycemic control: None  Inpatient Diabetes Program Recommendations Correction (SSI): May want to consider ordering CBGs with Novolog sensitive correction if needed. HgbA1C: Please consider ordering an A1C to evaluate glycemic control over the past 2-3 months.  Thanks, Barnie Alderman, RN, MSN, CCRN, CDE Diabetes Coordinator Inpatient Diabetes Program [phone] (Team Pager) [phone] ([facility] office) [phone] [facility] office)

## 2014-07-17 NOTE — Care Management Note (Addendum)
    Page 1 of 1   07/20/2014     2:29:06 PM CARE MANAGEMENT NOTE 07/20/2014  Patient:  [name], [name]   Account Number:  [id number]  Date Initiated:  07/17/2014  Documentation initiated by:  Theophilus Kinds  Subjective/Objective Assessment:   Pt admitted from home with diarrhea and dehydration. Pt is currently undergoing chemo. Pt lives with her husband and will return home at discharge. Pt is independent with ADL's.     Action/Plan:   No CM needs noted.   Anticipated DC Date:  07/19/2014   Anticipated DC Plan:  [place]  CM consult      Choice offered to / List presented to:             Status of service:  Completed, signed off Medicare Important Message given?   (If response is "NO", the following Medicare IM given date fields will be blank) Date Medicare IM given:   Medicare IM given by:   Date Additional Medicare IM given:   Additional Medicare IM given by:    Discharge Disposition:  HOME/SELF CARE  Per UR Regulation:    If discussed at Long Length of Stay Meetings, dates discussed:    Comments:  07/20/14 [place], RN BSN CM Anticipate discharge within 24 hours. No CM needs noted.  07/17/14 [place], RN BSN CM

## 2014-07-18 LAB — GI PATHOGEN PANEL BY PCR, STOOL
C difficile toxin A/B: NOT DETECTED
CAMPYLOBACTER BY PCR: NOT DETECTED
Cryptosporidium by PCR: NOT DETECTED
E COLI (ETEC) LT/ST: NOT DETECTED
E COLI (STEC): NOT DETECTED
E COLI 0157 BY PCR: NOT DETECTED
G lamblia by PCR: NOT DETECTED
Norovirus GI/GII: NOT DETECTED
ROTAVIRUS A BY PCR: NOT DETECTED
SHIGELLA BY PCR: NOT DETECTED
Salmonella by PCR: NOT DETECTED

## 2014-07-18 LAB — CBC
HEMATOCRIT: 28 % — AB (ref 36.0–46.0)
Hemoglobin: 9.2 g/dL — ABNORMAL LOW (ref 12.0–15.0)
MCH: 31.3 pg (ref 26.0–34.0)
MCHC: 32.9 g/dL (ref 30.0–36.0)
MCV: 95.2 fL (ref 78.0–100.0)
Platelets: 119 10*3/uL — ABNORMAL LOW (ref 150–400)
RBC: 2.94 MIL/uL — AB (ref 3.87–5.11)
RDW: 17 % — ABNORMAL HIGH (ref 11.5–15.5)
WBC: 2.3 10*3/uL — ABNORMAL LOW (ref 4.0–10.5)

## 2014-07-18 NOTE — Consult Note (Signed)
Reason for Consult: diarrhea Referring Physician: Hospitalist Burna H Hise is an 49 y.o. female.  HPI: Admitted Monday directly from the [place] at [facility].   Admitted due to diarrhea and her K was very low. Her K  Has been corrected. She has had diarrhea for 3 weeks. She was having at least 10-20 stools a day and very watery. Yesterday she had six loose, watery stools. Today she has had 2 stools so far. GI pathogen. She also c/o abdominal above and below her umbilicus. Which she describes as constant. Presently receiving chemo for presumed breast cancer with Mets to liver and bones. Diagnosed April 26, 2014. Underwent a CT  Abdomen/pelvis 07/08/2014 which revealed short symmetric segment of small bowel wall thickening or RIGHT lower quadrants is a with bowel anastomosis, however no bowel obstruction. This may be infectious or, inflammatory given the patient's history of treatment for breast cancer.  Diffuse hepatic metastasis are similar in number though generally smaller in size suggesting post treatment changes. Nodular liver contour, which could reflect early cirrhosis.  Diffuse osseous metastasis, predominately sclerotic, this suggests a component of treatment related changes though, additional new sclerotic metastasis is not excluded. No pathologic fracture.    05/25/2014 EGD:   Impression: Erosive antral gastritis otherwise normal EGD. Antral biopsy taken. No evidence of gastric primary.   Colonoscopy 07/23/2013 Screening: Dr. Laural Golden: Impression:  Examination performed to cecum. Small polyp ablated via cold biopsy from splenic flexure. External hemorrhoids. Biopsy:  Patient had small polyp removed from splenic flexure and it is sessile serrated adenoma  Results reviewed with patient.   She does not feel good. Her appetite is not good. She has nausea. GI pathogen is pending. OVA and PARA negative C-difficile is negative. Lactoferrin is positive. No recent  antibiotics before this admission. No fever. 2 rounds of chemo and 17 rounds of radiation to back and left shoulder. She did see some blood in her stool 2 days ago in the toilet and on toilet tissue. She says she feels 75% better.  CBC    Component Value Date/Time   WBC 2.3* 07/18/2014 0624   RBC 2.94* 07/18/2014 0624   HGB 9.2* 07/18/2014 0624   HCT 28.0* 07/18/2014 0624   PLT 119* 07/18/2014 0624   MCV 95.2 07/18/2014 0624   MCH 31.3 07/18/2014 0624   MCHC 32.9 07/18/2014 0624   RDW 17.0* 07/18/2014 0624   LYMPHSABS 0.6* 07/16/2014 1232   MONOABS 0.4 07/16/2014 1232   EOSABS 0.1 07/16/2014 1232   BASOSABS 0.0 07/16/2014 1232      Past Medical History  Diagnosis Date  . PONV (postoperative nausea and vomiting)     pt states scope patch does well  . Anemia   . Depression   . Anxiety   . Headache(784.0)     has migraines - medication controls  . Breast cancer 04/2014    Presumed dx; stage 4 w/ mets to bone and liver  . Bony metastasis     Past Surgical History  Procedure Laterality Date  . Right rotator cuff      2002  . Neck fusion      2003  . Laparoscopic cholecystectomy      2004  . Right knee arthroscopy      2005  . Colon surgery      2008  . Appendectomy      2008  . Abdominal hysterectomy    . Breast reduction surgery  03/17/2011    Procedure: MAMMARY REDUCTION BILATERAL (  BREAST);  Surgeon: Mary A Contogiannis;  Location: [facility];  Service: Plastics;  Laterality: Bilateral;  . Colonoscopy N/A 07/13/2013    Procedure: COLONOSCOPY;  Surgeon: Rogene Houston, MD;  Location: [facility];  Service: Endoscopy;  Laterality: N/A;  930  . Liver biopsy  04/2014  . Portacath placement    . Esophagogastroduodenoscopy N/A 05/25/2014    Procedure: ESOPHAGOGASTRODUODENOSCOPY (EGD);  Surgeon: Rogene Houston, MD;  Location: [facility];  Service: Endoscopy;  Laterality: N/A;  155    Family History  Problem Relation Age of Onset  . Colon  cancer Neg Hx   . Diabetes Father   . Heart attack Maternal Grandmother 30    multiple over lifetime.    Social History:  reports that she has quit smoking. Her smoking use included Cigarettes. She has never used smokeless tobacco. She reports that she drinks alcohol. She reports that she does not use illicit drugs.  Allergies: No Known Allergies  Medications: I have reviewed the patient's current medications.  Results for orders placed or performed during the hospital encounter of 07/16/14 (from the past 48 hour(s))  Magnesium     Status: None   Collection Time: 07/16/14 12:32 PM  Result Value Ref Range   Magnesium 2.1 1.5 - 2.5 mg/dL  Ova and parasite examination     Status: None   Collection Time: 07/16/14  1:30 PM  Result Value Ref Range   Specimen Description STOOL    Special Requests NONE    Ova and parasites      NO OVA OR PARASITES SEEN Performed at [facility]    Report Status 07/17/2014 FINAL   TSH     Status: None   Collection Time: 07/16/14  9:34 PM  Result Value Ref Range   TSH 0.719 0.350 - 4.500 uIU/mL  Comprehensive metabolic panel     Status: Abnormal   Collection Time: 07/17/14  6:45 AM  Result Value Ref Range   Sodium 141 135 - 145 mmol/L   Potassium 3.6 3.5 - 5.1 mmol/L    Comment: DELTA CHECK NOTED   Chloride 112 96 - 112 mmol/L   CO2 24 19 - 32 mmol/L   Glucose, Bld 133 (H) 70 - 99 mg/dL   BUN 5 (L) 6 - 23 mg/dL   Creatinine, Ser 0.54 0.50 - 1.10 mg/dL   Calcium 7.0 (L) 8.4 - 10.5 mg/dL   Total Protein 5.2 (L) 6.0 - 8.3 g/dL   Albumin 2.6 (L) 3.5 - 5.2 g/dL   AST 30 0 - 37 U/L   ALT 40 (H) 0 - 35 U/L   Alkaline Phosphatase 63 39 - 117 U/L   Total Bilirubin 0.2 (L) 0.3 - 1.2 mg/dL   GFR calc non Af Amer >90 >90 mL/min   GFR calc Af Amer >90 >90 mL/min    Comment: (NOTE) The eGFR has been calculated using the CKD EPI equation. This calculation has not been validated in all clinical situations. eGFR's persistently <90 mL/min signify  possible Chronic Kidney Disease.    Anion gap 5 5 - 15  CBC     Status: Abnormal   Collection Time: 07/17/14  6:45 AM  Result Value Ref Range   WBC 3.4 (L) 4.0 - 10.5 K/uL   RBC 3.65 (L) 3.87 - 5.11 MIL/uL   Hemoglobin 11.2 (L) 12.0 - 15.0 g/dL   HCT 34.5 (L) 36.0 - 46.0 %   MCV 94.5 78.0 - 100.0 fL  MCH 30.7 26.0 - 34.0 pg   MCHC 32.5 30.0 - 36.0 g/dL   RDW 16.7 (H) 11.5 - 15.5 %   Platelets 143 (L) 150 - 400 K/uL  Urinalysis, Routine w reflex microscopic     Status: Abnormal   Collection Time: 07/17/14  6:00 PM  Result Value Ref Range   Color, Urine YELLOW YELLOW   APPearance CLEAR CLEAR   Specific Gravity, Urine >1.030 (H) 1.005 - 1.030   pH 5.5 5.0 - 8.0   Glucose, UA NEGATIVE NEGATIVE mg/dL   Hgb urine dipstick NEGATIVE NEGATIVE   Bilirubin Urine NEGATIVE NEGATIVE   Ketones, ur NEGATIVE NEGATIVE mg/dL   Protein, ur NEGATIVE NEGATIVE mg/dL   Urobilinogen, UA 0.2 0.0 - 1.0 mg/dL   Nitrite NEGATIVE NEGATIVE   Leukocytes, UA NEGATIVE NEGATIVE    Comment: MICROSCOPIC NOT DONE ON URINES WITH NEGATIVE PROTEIN, BLOOD, LEUKOCYTES, NITRITE, OR GLUCOSE <1000 mg/dL.  CBC     Status: Abnormal   Collection Time: 07/18/14  6:24 AM  Result Value Ref Range   WBC 2.3 (L) 4.0 - 10.5 K/uL   RBC 2.94 (L) 3.87 - 5.11 MIL/uL   Hemoglobin 9.2 (L) 12.0 - 15.0 g/dL    Comment: DELTA CHECK NOTED RESULT REPEATED AND VERIFIED    HCT 28.0 (L) 36.0 - 46.0 %   MCV 95.2 78.0 - 100.0 fL   MCH 31.3 26.0 - 34.0 pg   MCHC 32.9 30.0 - 36.0 g/dL   RDW 17.0 (H) 11.5 - 15.5 %   Platelets 119 (L) 150 - 400 K/uL    Comment: SPECIMEN CHECKED FOR CLOTS PLATELET COUNT CONFIRMED BY SMEAR     No results found.  ROS Blood pressure 114/58, pulse 64, temperature 98.6 F (37 C), temperature source Oral, resp. rate 20, height '5\' 8"'  (1.727 m), weight 231 lb 8 oz (105.008 kg), SpO2 97 %. Physical Exam Alert and oriented. Skin warm and dry. Oral mucosa is moist.   . Sclera anicteric, conjunctivae is pink.  Thyroid not enlarged. No cervical lymphadenopathy. Lungs clear. Heart regular rate and rhythm.  Abdomen is soft. Bowel sounds are positive. No hepatomegaly. No abdominal masses felt. No tenderness.  No edema to lower extremities.    Assessment/Plan: Presumed metastatic breast cancer to bones and liver.  Diarrhea. ? Infectious in nature. GI pathogen is pending. Lactoferrin is positive. C-diff is negative. OVA and para negative. Agree with Cipro and Flagyl. I will discuss with Dr Laural Golden. Has had recent colonoscopy. Further recommendations to follow.  SETZER,TERRI W 07/18/2014, 7:48 AM    GI attending note; Patient interviewed and examined. Patient is well known to me from prior evaluation. Patient is 49 year old Caucasian female was metastatic breast carcinoma and is undergoing chemotherapy. Patient developed diarrhea within 2 days of getting chemotherapy and failed outpatient therapy. She received IV fluids in 2 locations. Patient was admitted 2 days ago with dehydration and hypokalemia. She is feeling some better. She is having epigastric and midabdominal pain usually after meals. Abdominal exam reveals normal bowel sounds, soft abdomen with mild tenderness in midepigastrium and perianal umbilical region. Stool C. difficile by PCR is negative and GI pathogen panel is pending. Assessment; Chemotherapy-induced diarrhea responding to empiric antibiotic therapy along with antidiarrheals. While GI pathogen panel is pending diarrhea would appear to be due to toxic effect of chemotherapy on GI tract. Diet advanced to low-residue diet. For repeat CBC and metabolic 7 in AM.

## 2014-07-18 NOTE — Progress Notes (Signed)
[facility] PROGRESS NOTE  [name] LOV:[id number] DOB: November 09, 1965 DOA: 07/16/2014 PCP: [name], MD  Assessment/Plan: 1. Diarrhea. C. difficile found to be negative. CT scan of abdomen from approximately 1 week ago indicated evidence of enteritis. Remainder stool studies are currently pending. Fecal leukocytes are positive. Ova/parasites, GI pathogen panel are in process. She is currently on antibiotics. Continue antidiarrheals. Chemotherapy agent could certainly be playing a role. Appreciate oncology and gastroenterology input. Advance diet 2. Dehydration. Improved with IV fluids 3. Orthostatic hypotension. Improving with hydration 4. Hypokalemia. Improve. Magnesium normal 5. Stage IV breast cancer. Further follow-up with oncology 6. History of DVT in the neck, currently on Lovenox  Code Status: full code Family Communication: discussed with patient at the bedside Disposition Plan: discharge home once improve, likely in am   Consultants:  Oncology  gastroenterology  Procedures:    Antibiotics:  cipro 3/7  Flagyl 3/7  HPI/Subjective: She continues to have frequent stools. Nausea appears to be improving. Generalized weakness is better  Objective: Filed Vitals:   07/18/14 1545  BP: 116/58  Pulse: 64  Temp: 98.6 F (37 C)  Resp: 20    Intake/Output Summary (Last 24 hours) at 07/18/14 1906 Last data filed at 07/18/14 1200  Gross per 24 hour  Intake    483 ml  Output      0 ml  Net    483 ml   Filed Weights   07/16/14 1522  Weight: 105.008 kg (231 lb 8 oz)    Exam:   General:  NAD  Cardiovascular: S1, S2 RRR  Respiratory: CTA B  Abdomen: soft, diffuse tenderness, but more in periumbilical area bs+  Musculoskeletal: no edema b/l   Data Reviewed: Basic Metabolic Panel:  Recent Labs Lab 07/16/14 1232 07/17/14 0645  NA 139 141  K 2.2* 3.6  CL 107 112  CO2 23 24  GLUCOSE 196* 133*  BUN 7 5*  CREATININE 0.72 0.54  CALCIUM  7.8* 7.0*  MG 2.1  --    Liver Function Tests:  Recent Labs Lab 07/16/14 1232 07/17/14 0645  AST 30 30  ALT 37* 40*  ALKPHOS 74 63  BILITOT 0.4 0.2*  PROT 6.2 5.2*  ALBUMIN 3.2* 2.6*   No results for input(s): LIPASE, AMYLASE in the last 168 hours. No results for input(s): AMMONIA in the last 168 hours. CBC:  Recent Labs Lab 07/16/14 1232 07/17/14 0645 07/18/14 0624  WBC 4.3 3.4* 2.3*  NEUTROABS 3.3  --   --   HGB 12.6 11.2* 9.2*  HCT 38.5 34.5* 28.0*  MCV 93.4 94.5 95.2  PLT 144* 143* 119*   Cardiac Enzymes: No results for input(s): CKTOTAL, CKMB, CKMBINDEX, TROPONINI in the last 168 hours. BNP (last 3 results) No results for input(s): BNP in the last 8760 hours.  ProBNP (last 3 results) No results for input(s): PROBNP in the last 8760 hours.  CBG: No results for input(s): GLUCAP in the last 168 hours.  Recent Results (from the past 240 hour(s))  Clostridium Difficile by PCR     Status: None   Collection Time: 07/16/14  1:30 PM  Result Value Ref Range Status   C difficile by pcr NEGATIVE NEGATIVE Final  Ova and parasite examination     Status: None   Collection Time: 07/16/14  1:30 PM  Result Value Ref Range Status   Specimen Description STOOL  Final   Special Requests NONE  Final   Ova and parasites   Final  NO OVA OR PARASITES SEEN Performed at [facility]    Report Status 07/17/2014 FINAL  Final     Studies: No results found.  Scheduled Meds: . ciprofloxacin  400 mg Intravenous Q12H  . diphenoxylate-atropine  2 tablet Oral QID  . enoxaparin (LOVENOX) injection  1 mg/kg Subcutaneous Q12H  . metronidazole  500 mg Intravenous Q8H  . morphine  15 mg Oral Q12H  . octreotide  150 mcg Subcutaneous 3 times per day  . pantoprazole (PROTONIX) IV  40 mg Intravenous Q24H  . saccharomyces boulardii  250 mg Oral BID  . sertraline  200 mg Oral Daily  . sodium chloride  3 mL Intravenous Q12H  . tamoxifen  20 mg Oral Daily  . zolpidem  5 mg  Oral QHS   Continuous Infusions: . 0.9 % NaCl with KCl 40 mEq / L 100 mL/hr (07/18/14 1454)    Active Problems:   Hypokalemia   Breast cancer, stage 4   Diarrhea   DVT (deep venous thrombosis)   Dehydration   Orthostatic hypotension   GERD (gastroesophageal reflux disease)    Time spent: 30mins    [name]  [facility] Pager [phone]. If 7PM-7AM, please contact night-coverage at [email or web address], password [facility] 07/18/2014, 7:06 PM  LOS: 2 days

## 2014-07-19 DIAGNOSIS — D696 Thrombocytopenia, unspecified: Secondary | ICD-10-CM

## 2014-07-19 DIAGNOSIS — D649 Anemia, unspecified: Secondary | ICD-10-CM

## 2014-07-19 DIAGNOSIS — D72819 Decreased white blood cell count, unspecified: Secondary | ICD-10-CM

## 2014-07-19 DIAGNOSIS — E876 Hypokalemia: Secondary | ICD-10-CM

## 2014-07-19 DIAGNOSIS — C50919 Malignant neoplasm of unspecified site of unspecified female breast: Secondary | ICD-10-CM

## 2014-07-19 DIAGNOSIS — R197 Diarrhea, unspecified: Secondary | ICD-10-CM

## 2014-07-19 LAB — DIFFERENTIAL
Basophils Absolute: 0 10*3/uL (ref 0.0–0.1)
Basophils Relative: 1 % (ref 0–1)
Eosinophils Absolute: 0.1 10*3/uL (ref 0.0–0.7)
Eosinophils Relative: 5 % (ref 0–5)
LYMPHS ABS: 0.5 10*3/uL — AB (ref 0.7–4.0)
Lymphocytes Relative: 18 % (ref 12–46)
Monocytes Absolute: 0.4 10*3/uL (ref 0.1–1.0)
Monocytes Relative: 15 % — ABNORMAL HIGH (ref 3–12)
NEUTROS PCT: 62 % (ref 43–77)
Neutro Abs: 1.9 10*3/uL (ref 1.7–7.7)

## 2014-07-19 LAB — BASIC METABOLIC PANEL
Anion gap: 3 — ABNORMAL LOW (ref 5–15)
BUN: <5 mg/dL — ABNORMAL LOW (ref 6–23)
CO2: 26 mmol/L (ref 19–32)
Calcium: 6.4 mg/dL — CL (ref 8.4–10.5)
Chloride: 110 mmol/L (ref 96–112)
Creatinine, Ser: 0.46 mg/dL — ABNORMAL LOW (ref 0.50–1.10)
GFR calc Af Amer: >90 mL/min (ref >90)
GFR calc non Af Amer: >90 mL/min (ref >90)
GLUCOSE: 142 mg/dL — AB (ref 70–99)
Potassium: 3.2 mmol/L — ABNORMAL LOW (ref 3.5–5.1)
SODIUM: 139 mmol/L (ref 135–145)

## 2014-07-19 LAB — CBC
HEMATOCRIT: 32.2 % — AB (ref 36.0–46.0)
Hemoglobin: 10.4 g/dL — ABNORMAL LOW (ref 12.0–15.0)
MCH: 30.9 pg (ref 26.0–34.0)
MCHC: 32.3 g/dL (ref 30.0–36.0)
MCV: 95.5 fL (ref 78.0–100.0)
Platelets: 140 10*3/uL — ABNORMAL LOW (ref 150–400)
RBC: 3.37 MIL/uL — AB (ref 3.87–5.11)
RDW: 16.9 % — ABNORMAL HIGH (ref 11.5–15.5)
WBC: 2.9 10*3/uL — ABNORMAL LOW (ref 4.0–10.5)

## 2014-07-19 LAB — ALBUMIN: Albumin: 2.4 g/dL — ABNORMAL LOW (ref 3.5–5.2)

## 2014-07-19 MED ORDER — POTASSIUM CHLORIDE CRYS ER 20 MEQ PO TBCR
40.0000 meq | EXTENDED_RELEASE_TABLET | ORAL | Status: AC
  Administered 2014-07-19 (×2): 40 meq via ORAL
  Filled 2014-07-19 (×2): qty 2

## 2014-07-19 MED ORDER — OXYCODONE HCL 5 MG PO TABS
10.0000 mg | ORAL_TABLET | ORAL | Status: DC | PRN
  Administered 2014-07-19 – 2014-07-20 (×6): 10 mg via ORAL
  Filled 2014-07-19 (×6): qty 2

## 2014-07-19 MED ORDER — SODIUM CHLORIDE 0.9 % IV SOLN
2.0000 g | Freq: Once | INTRAVENOUS | Status: AC
  Administered 2014-07-19: 2 g via INTRAVENOUS
  Filled 2014-07-19: qty 20

## 2014-07-19 NOTE — Progress Notes (Signed)
[facility] PROGRESS NOTE  [name] NUU:[id number] DOB: 29-Jul-1965 DOA: 07/16/2014 PCP: [name], MD  Assessment/Plan: 1. Diarrhea. C. difficile found to be negative. CT scan of abdomen from approximately 1 week ago indicated evidence of enteritis. Remainder of stool studies also are unrevealing.. We will discontinue antibiotics. Discontinue antidiarrheals since the patient is now reporting constipation. Diarrhea is felt to be related to recent chemotherapy agent. Appreciate oncology and gastroenterology input. Advance diet 2. Dehydration. Improved with IV fluids 3. Orthostatic hypotension. Improving with hydration 4. Hypokalemia. Replace. Magnesium normal 5. Stage IV breast cancer. Further follow-up with oncology 6. History of DVT in the neck, currently on Lovenox  Code Status: full code Family Communication: discussed with patient at the bedside Disposition Plan: discharge home once improve   Consultants:  Oncology  gastroenterology  Procedures:    Antibiotics:  cipro 3/7-3/10  Flagyl 3/7-3/10  HPI/Subjective: Complains of worsening periumbilical abdominal pain. She is has some nausea. Diarrhea has resolved and she reports not being able to move her bowels.  Objective: Filed Vitals:   07/19/14 1434  BP: 99/46  Pulse: 70  Temp: 98.5 F (36.9 C)  Resp: 20    Intake/Output Summary (Last 24 hours) at 07/19/14 1927 Last data filed at 07/19/14 1853  Gross per 24 hour  Intake   2120 ml  Output      0 ml  Net   2120 ml   Filed Weights   07/16/14 1522  Weight: 105.008 kg (231 lb 8 oz)    Exam:   General:  NAD  Cardiovascular: S1, S2 RRR  Respiratory: CTA B  Abdomen: soft, diffuse tenderness, but more in periumbilical area bs+  Musculoskeletal: no edema b/l   Data Reviewed: Basic Metabolic Panel:  Recent Labs Lab 07/16/14 1232 07/17/14 0645 07/19/14 0553  NA 139 141 139  K 2.2* 3.6 3.2*  CL 107 112 110  CO2 23 24 26    GLUCOSE 196* 133* 142*  BUN 7 5* <5*  CREATININE 0.72 0.54 0.46*  CALCIUM 7.8* 7.0* 6.4*  MG 2.1  --   --    Liver Function Tests:  Recent Labs Lab 07/16/14 1232 07/17/14 0645 07/19/14 0600  AST 30 30  --   ALT 37* 40*  --   ALKPHOS 74 63  --   BILITOT 0.4 0.2*  --   PROT 6.2 5.2*  --   ALBUMIN 3.2* 2.6* 2.4*   No results for input(s): LIPASE, AMYLASE in the last 168 hours. No results for input(s): AMMONIA in the last 168 hours. CBC:  Recent Labs Lab 07/16/14 1232 07/17/14 0645 07/18/14 0624 07/19/14 0553 07/19/14 0600  WBC 4.3 3.4* 2.3* 2.9*  --   NEUTROABS 3.3  --   --   --  1.9  HGB 12.6 11.2* 9.2* 10.4*  --   HCT 38.5 34.5* 28.0* 32.2*  --   MCV 93.4 94.5 95.2 95.5  --   PLT 144* 143* 119* 140*  --    Cardiac Enzymes: No results for input(s): CKTOTAL, CKMB, CKMBINDEX, TROPONINI in the last 168 hours. BNP (last 3 results) No results for input(s): BNP in the last 8760 hours.  ProBNP (last 3 results) No results for input(s): PROBNP in the last 8760 hours.  CBG: No results for input(s): GLUCAP in the last 168 hours.  Recent Results (from the past 240 hour(s))  Clostridium Difficile by PCR     Status: None   Collection Time: 07/16/14  1:30 PM  Result Value Ref  Range Status   C difficile by pcr NEGATIVE NEGATIVE Final  Ova and parasite examination     Status: None   Collection Time: 07/16/14  1:30 PM  Result Value Ref Range Status   Specimen Description STOOL  Final   Special Requests NONE  Final   Ova and parasites   Final    NO OVA OR PARASITES SEEN Performed at [facility]    Report Status 07/17/2014 FINAL  Final     Studies: No results found.  Scheduled Meds: . enoxaparin (LOVENOX) injection  1 mg/kg Subcutaneous Q12H  . morphine  15 mg Oral Q12H  . pantoprazole (PROTONIX) IV  40 mg Intravenous Q24H  . saccharomyces boulardii  250 mg Oral BID  . sertraline  200 mg Oral Daily  . sodium chloride  3 mL Intravenous Q12H  . tamoxifen   20 mg Oral Daily  . zolpidem  5 mg Oral QHS   Continuous Infusions: . 0.9 % NaCl with KCl 40 mEq / L 100 mL/hr (07/19/14 1330)    Active Problems:   Hypokalemia   Breast cancer, stage 4   Diarrhea   DVT (deep venous thrombosis)   Dehydration   Orthostatic hypotension   GERD (gastroesophageal reflux disease)    Time spent: 42mins    [name]  [facility] Pager [phone]. If 7PM-7AM, please contact night-coverage at [email or web address], password [facility] 07/19/2014, 7:27 PM  LOS: 3 days

## 2014-07-19 NOTE — Progress Notes (Signed)
I was contacted by lab about a critical calcium of 6.4. Dr.Memon was notified.

## 2014-07-19 NOTE — Progress Notes (Signed)
Subjective: She is seen in a recliner with her sister sitting next to her.    [name] is smiling and her spirits are excellent given her hospitalization.  She reports, "I try to be tough and wait until my scheduled appointments so I am not 'labelled'."  I told her that she should not expect complications with every treatment but these complications are not unexpected or rare.  They happen.  I have recommended, "Be a sissy and call us when you are not feeling well.  Do not try to wait.  Sometimes we can help and fix things as an outpatient and avoid hospitalizations."  She is agreeable to this and she smiled and laughed.  [name] is a very tough woman with regards to trying to fight/avoid symptoms until they really become a problem for her.  I have encouraged her to avoid doing that and let us support her for the time being.  She is agreeable to this plan.  She notes that her stools are more formed.    Objective: Vital signs in last 24 hours: Temp:  [97.9 F (36.6 C)-98.6 F (37 C)] 97.9 F (36.6 C) (03/10 0756) Pulse Rate:  [64-76] 76 (03/10 0756) Resp:  [20] 20 (03/10 0756) BP: (109-145)/(58-63) 109/58 mmHg (03/10 0756) SpO2:  [98 %-99 %] 98 % (03/10 0756)  Intake/Output from previous day: 03/09 0800 - 03/10 0759 In: 3383 [P.O.:480; I.V.:2203; IV Piggyback:700] Out: -  Intake/Output this shift:    General appearance: alert, cooperative, no distress, moderately obese and smiling and laughing with me.  Excellent spirits and mood.  Lab Results:   Recent Labs  07/18/14 0624 07/19/14 0553  WBC 2.3* 2.9*  HGB 9.2* 10.4*  HCT 28.0* 32.2*  PLT 119* 140*   BMET  Recent Labs  07/17/14 0645 07/19/14 0553  NA 141 139  K 3.6 3.2*  CL 112 110  CO2 24 26  GLUCOSE 133* 142*  BUN 5* <5*  CREATININE 0.54 0.46*  CALCIUM 7.0* 6.4*    Studies/Results: No results found.  Medications: I have reviewed the patient's current medications.  Assessment/Plan: 1. Perjeta-induced diarrhea.   Negative GI work-up.  Indifferent regarding the need for antibiotics. 2. Leukopenia.  Will add differential today. 3. Anemia, normocytic, normochromic.  Stable 4. Thrombocytopenia, stable and mild.  5. Hypokalemia 6. Stage IV, ER/PR+, HER2+ breast cancer, on systemic treatment.  Oncology history follows below:    Breast cancer, stage 4   04/25/2014 Initial Diagnosis Breast cancer, stage 4   04/25/2014 Imaging CT abdomen/pelvia with widespread metastatic disease to the liver, multiple lytic lesions throughout spine and pelvis. No FX or epidural tumor identified   04/26/2014 Imaging CT head unremarkable   04/26/2014 Imaging CT chest with no lung mass or pulmonary nodules, no adenopathy. Lytic bone lesions, right 2nd rib   04/27/2014 Initial Biopsy U/S guided liver biopsy, lesion in anterior and inferior left hepatic lobe biopsied   04/27/2014 Pathology Results Metastatic adenocarcinoma, CK7, ER+, patchy positivity with PR. Possible primary includes breast, less likely gynecologic   05/15/2014 Mammogram BI-RADS CATEGORY  2: Benign Finding(s)   05/16/2014 PET scan 1. Intensely hypermetabolic hepatic metastasis. 2. Widespread hypermetabolic skeletal lesions. 3. No primary adenocarcinoma identified by FDG PET imaging.   05/19/2014 Imaging MUGA- Left ventricular ejection fracture greater than 70%.   05/21/2014 Breast MRI No suspicious masses or enhancement within the breasts. No axillary adenopathy.   05/22/2014 -  Antibody Plan Herceptin/Perjeta/Tamoxifen   06/12/2014 -  Chemotherapy Taxotere added secondary to persistent  abdominal and back pain   06/17/2014 - 06/19/2014 Hospital Admission Neutropenia, fever, diarrhea, nausea, vomiting   06/20/2014 - 07/10/2014 Radiation Therapy Dr. Thea Silversmith 12 fractions to L3-S3 (30 Gy) and left scapula (20 Gy).      Patient and plan discussed with Dr. Ancil Linsey and she is in agreement with the aforementioned.    LOS: 3 days    KEFALAS,THOMAS 07/19/2014   AS  above. Nothing further to add. She will f/u with [place] as outpatient. Donald Pore MD

## 2014-07-19 NOTE — Progress Notes (Signed)
ANTICOAGULATION CONSULT NOTE - follow up  Pharmacy Consult for Lovenox Indication: VTE Treatment  No Known Allergies  Patient Measurements: Height: 5\' 8"  (172.7 cm) Weight: 231 lb 8 oz (105.008 kg) IBW/kg (Calculated) : 63.9  Vital Signs: Temp: 98.6 F (37 C) (03/09 2126) Temp Source: Oral (03/09 2126) BP: 136/63 mmHg (03/09 2129) Pulse Rate: 76 (03/09 2126)  Labs:  Recent Labs  07/16/14 1232 07/17/14 0645 07/18/14 0624 07/19/14 0553  HGB 12.6 11.2* 9.2* 10.4*  HCT 38.5 34.5* 28.0* 32.2*  PLT 144* 143* 119* 140*  CREATININE 0.72 0.54  --  0.46*   Estimated Creatinine Clearance: 105.5 mL/min (by C-G formula based on Cr of 0.46).  Medical History: Past Medical History  Diagnosis Date  . PONV (postoperative nausea and vomiting)     pt states scope patch does well  . Anemia   . Depression   . Anxiety   . Headache(784.0)     has migraines - medication controls  . Breast cancer 04/2014    Presumed dx; stage 4 w/ mets to bone and liver  . Bony metastasis    Medications:  Scheduled:  . ciprofloxacin  400 mg Intravenous Q12H  . diphenoxylate-atropine  2 tablet Oral QID  . enoxaparin (LOVENOX) injection  1 mg/kg Subcutaneous Q12H  . metronidazole  500 mg Intravenous Q8H  . morphine  15 mg Oral Q12H  . octreotide  150 mcg Subcutaneous 3 times per day  . pantoprazole (PROTONIX) IV  40 mg Intravenous Q24H  . potassium chloride  40 mEq Oral Q3H  . saccharomyces boulardii  250 mg Oral BID  . sertraline  200 mg Oral Daily  . sodium chloride  3 mL Intravenous Q12H  . tamoxifen  20 mg Oral Daily  . zolpidem  5 mg Oral QHS   Assessment: 49 year old female with stage IV breast cancer presents with worsening pain in the right chest and neck, significant swelling. Ultrasound (07/09/14) showed DVT of the internal jugular and subclavian. Patient has been started on Lovenox PTA.  CBC appears stable.  No bleeding reported. Excellent renal function  Goal of Therapy:  Anti-Xa  level 0.6-1 units/ml 4hrs after LMWH dose given Monitor platelets by anticoagulation protocol: Yes   Plan:  Lovenox 105 mg SQ (1 mg/kg) every 12 hours Monitor renal function Monitor CBC, platelets, bleeding  [place] Crane, Tkeyah Burkman A 07/19/2014,7:52 AM

## 2014-07-19 NOTE — Progress Notes (Signed)
  Subjective:  Patient reports worsening epigastric and fatty umbilical pain. She went from having multiple stools yesterday to none today. She states finally her bowels did move when she feels better. She complains of nausea. Her appetite is poor. She is trying to eat as much as she can. She denies rectal bleeding.   Objective: Blood pressure 99/46, pulse 70, temperature 98.5 F (36.9 C), temperature source Oral, resp. rate 20, height 5\' 8"  (1.727 m), weight 231 lb 8 oz (105.008 kg), SpO2 98 %. Patient is alert and appears to be in no acute distress. Abdomen is full but soft with mild midepigastric and fatty umbilical tenderness. No organomegaly or masses.  No LE edema or clubbing noted.  Labs/studies Results:   Recent Labs  07/17/14 0645 07/18/14 0624 07/19/14 0553  WBC 3.4* 2.3* 2.9*  HGB 11.2* 9.2* 10.4*  HCT 34.5* 28.0* 32.2*  PLT 143* 119* 140*    BMET   Recent Labs  07/17/14 0645 07/19/14 0553  NA 141 139  K 3.6 3.2*  CL 112 110  CO2 24 26  GLUCOSE 133* 142*  BUN 5* <5*  CREATININE 0.54 0.46*  CALCIUM 7.0* 6.4*    Serum albumin is 2.4 g/dL GI pathogen panel is negative.  #1. Acute diarrhea secondary to toxic effect of chemotherapy. Primary culprit felt to be Perjeta and possibly Taxotere. C. difficile by PCR was negative and GI pathogen panel was also negative. Diarrhea has resolved and diphenoxylate has been discontinued. Her exam does not suggest ileus or megacolon. #2. Leukopenia secondary to chemotherapy. [place] 1900. #3. Hypokalemia. Serum potassium has dropped again. She is receiving KCl and IV fluids  Recommendations;  Can stop IV antibiotics if Dr. Whitney Muse is in agreement. If diarrhea relapses will use often oxalate at a lower dose.

## 2014-07-20 LAB — CBC
HEMATOCRIT: 33 % — AB (ref 36.0–46.0)
HEMOGLOBIN: 10.7 g/dL — AB (ref 12.0–15.0)
MCH: 30.9 pg (ref 26.0–34.0)
MCHC: 32.4 g/dL (ref 30.0–36.0)
MCV: 95.4 fL (ref 78.0–100.0)
Platelets: 147 10*3/uL — ABNORMAL LOW (ref 150–400)
RBC: 3.46 MIL/uL — ABNORMAL LOW (ref 3.87–5.11)
RDW: 17.1 % — ABNORMAL HIGH (ref 11.5–15.5)
WBC: 3.4 10*3/uL — AB (ref 4.0–10.5)

## 2014-07-20 LAB — BASIC METABOLIC PANEL
Anion gap: 5 (ref 5–15)
BUN: <5 mg/dL — ABNORMAL LOW (ref 6–23)
CALCIUM: 6.9 mg/dL — AB (ref 8.4–10.5)
CO2: 24 mmol/L (ref 19–32)
CREATININE: 0.5 mg/dL (ref 0.50–1.10)
Chloride: 110 mmol/L (ref 96–112)
GFR calc Af Amer: >90 mL/min (ref >90)
GFR calc non Af Amer: >90 mL/min (ref >90)
GLUCOSE: 130 mg/dL — AB (ref 70–99)
Potassium: 3.5 mmol/L (ref 3.5–5.1)
SODIUM: 139 mmol/L (ref 135–145)

## 2014-07-20 MED ORDER — BISACODYL 10 MG RE SUPP: 10.0000 mg | RECTAL | Status: DC | PRN

## 2014-07-20 MED ORDER — OXYCODONE HCL 5 MG PO TABS: 5.0000 mg | ORAL_TABLET | ORAL | Status: DC | PRN

## 2014-07-20 MED ORDER — ONDANSETRON HCL 4 MG PO TABS: 4.0000 mg | ORAL_TABLET | Freq: Four times a day (QID) | ORAL | Status: DC | PRN

## 2014-07-20 MED ORDER — DIPHENOXYLATE-ATROPINE 2.5-0.025 MG PO TABS: ORAL_TABLET | ORAL | Status: DC

## 2014-07-20 MED ORDER — HEPARIN SOD (PORK) LOCK FLUSH 100 UNIT/ML IV SOLN
500.0000 [IU] | INTRAVENOUS | Status: AC | PRN
  Administered 2014-07-20: 500 [IU]
  Filled 2014-07-20: qty 5

## 2014-07-20 NOTE — Discharge Summary (Signed)
Physician Discharge Summary  [place] NID:[id number] DOB: Sep 24, 1965 DOA: 07/16/2014  PCP: [name], MD  Admit date: 07/16/2014 Discharge date: 07/20/2014  Time spent: 40 minutes  Recommendations for Outpatient Follow-up:  1. Follow-up with oncology as previously scheduled  Discharge Diagnoses:  Active Problems:   Hypokalemia   Breast cancer, stage 4   Diarrhea   DVT (deep venous thrombosis)   Dehydration   Orthostatic hypotension   GERD (gastroesophageal reflux disease)   Discharge Condition: Improved  Diet recommendation: Low-salt  Filed Weights   07/16/14 1522  Weight: 105.008 kg (231 lb 8 oz)    History of present illness:  This patient was directly admitted from the [facility] after she was found to be generally weak, orthostatic and severely hypokalemic. The patient was having severe diarrhea for the past several weeks which is thought to be related to her recent chemotherapy.  Hospital Course:  Patient was started on intravenous hydration, her potassium was replaced. She was initially started on intravenous antibiotics since her recent CT scan approximately one week prior to admission indicated an enteritis. Stool studies were sent which were all found to be negative. Since there was no infectious etiology, antibiotics were discontinued. She was started on antidiarrheals with improvement in her diarrhea. She subsequently developed constipation when antidiarrheals were discontinued. Her diet was advanced and she is tolerating a solid diet. Remainder of her blood work is unremarkable. She will follow-up with oncology to discuss different chemotherapy/reduced dosage. The patient is requesting discharge home.  Procedures:    Consultations:  Oncology  GI  Discharge Exam: Filed Vitals:   07/20/14 1328  BP: 114/61  Pulse: 71  Temp: 98.4 F (36.9 C)  Resp: 20    General: No acute distress Cardiovascular: S1, S2, regular rate and  rhythm Respiratory: Clear to auscultation bilaterally  Discharge Instructions   Discharge Instructions    Call MD for:  persistant nausea and vomiting    Complete by:  As directed      Call MD for:  severe uncontrolled pain    Complete by:  As directed      Diet - low sodium heart healthy    Complete by:  As directed      Increase activity slowly    Complete by:  As directed           Discharge Medication List as of 07/20/2014  6:19 PM    START taking these medications   Details  bisacodyl (DULCOLAX) 10 MG suppository Place 1 suppository (10 mg total) rectally as needed for moderate constipation., Starting 07/20/2014, Until Discontinued, Print    !! ondansetron (ZOFRAN) 4 MG tablet Take 1 tablet (4 mg total) by mouth every 6 (six) hours as needed for nausea., Starting 07/20/2014, Until Discontinued, Print     !! - Potential duplicate medications found. Please discuss with provider.    CONTINUE these medications which have CHANGED   Details  diphenoxylate-atropine (LOMOTIL) 2.5-0.025 MG per tablet May take 1 tablet four times a day  for diarrhea., Print    oxyCODONE (OXY IR/ROXICODONE) 5 MG immediate release tablet Take 1-2 tablets (5-10 mg total) by mouth every 4 (four) hours as needed., Starting 07/20/2014, Until Discontinued, Print      CONTINUE these medications which have NOT CHANGED   Details  acetaminophen (TYLENOL) 325 MG tablet Take 650 mg by mouth every 6 (six) hours as needed for mild pain or fever. , Until Discontinued, Historical Med    [place]  Hydroxide-Simeth (MAGIC MOUTHWASH) SOLN Take 10 mLs by mouth 4 (four) times daily as needed for mouth pain., Starting 06/19/2014, Until Discontinued, Print    Calcium-Phosphorus-Vitamin D (CALCIUM/D3 ADULT GUMMIES PO) Take by mouth. Patient taking 4 gummies a day to equal her dosage of calcium 1200mg  and vitamin d 1000mg , Until Discontinued, Historical Med    chlorhexidine (PERIDEX) 0.12 % solution Use as directed 15 mLs in  the mouth or throat 4 (four) times daily., Starting 06/19/2014, Until Discontinued, Print    dexamethasone (DECADRON) 4 MG tablet 2 tablet bid starting day before chemotherapy, day of and day after, Normal    diphenhydrAMINE (BENADRYL) 25 mg capsule Take 25 mg by mouth every 6 (six) hours as needed for itching., Until Discontinued, Historical Med    enoxaparin (LOVENOX) 150 MG/ML injection Inject 0.74 mLs (110 mg total) into the skin every 12 (twelve) hours., Starting 07/09/2014, Until Discontinued, Normal    lidocaine (XYLOCAINE) 2 % solution Use as directed 5 mLs in the mouth or throat 4 (four) times daily as needed for mouth pain., Starting 06/19/2014, Until Discontinued, Print    lidocaine-prilocaine (EMLA) cream Apply 1 application topically daily as needed (apply to port before chemo). , Starting 05/17/2014, Until Discontinued, Historical Med    LORazepam (ATIVAN) 0.5 MG tablet Take 1 tablet (0.5 mg total) by mouth every 6 (six) hours as needed. anxiety/nausea, Starting 07/06/2014, Until Discontinued, Print    morphine (MS CONTIN) 15 MG 12 hr tablet Take 1 tablet (15 mg total) by mouth every 12 (twelve) hours., Starting 07/03/2014, Until Discontinued, Print    !! ondansetron (ZOFRAN) 8 MG tablet Take 1 tablet (8 mg total) by mouth every 8 (eight) hours as needed for nausea or vomiting., Starting 05/17/2014, Until Discontinued, Normal    pantoprazole (PROTONIX) 40 MG tablet Take 1 tablet (40 mg total) by mouth daily., Starting 05/16/2014, Until Discontinued, Normal    polyethylene glycol (MIRALAX / GLYCOLAX) packet Take 17 g by mouth daily as needed for mild constipation., Until Discontinued, Historical Med    Potassium Bicarb-Citric Acid ([name]) 20 MEQ TBEF Take 1 tablet (20 mEq total) by mouth 2 (two) times daily., Starting 07/09/2014, Until Discontinued, Normal    prochlorperazine (COMPAZINE) 10 MG tablet Take 1 tablet (10 mg total) by mouth every 6 (six) hours as needed for nausea or vomiting.,  Starting 07/05/2014, Until Discontinued, Normal    sertraline (ZOLOFT) 100 MG tablet Take 2 tablets (200 mg total) by mouth daily., Starting 06/12/2014, Until Discontinued, Normal    sodium chloride (OCEAN) 0.65 % SOLN nasal spray Place 1 spray into both nostrils as needed for congestion., Starting 06/19/2014, Until Discontinued, No Print    SUMAtriptan (IMITREX) 100 MG tablet Take 1 tablet (100 mg total) by mouth as needed. foir migraine, Starting 07/03/2014, Until Discontinued, Normal    tamoxifen (NOLVADEX) 20 MG tablet Take 1 tablet (20 mg total) by mouth daily., Starting 05/09/2014, Until Discontinued, Normal    zolpidem (AMBIEN) 10 MG tablet Take 1 tablet (10 mg total) by mouth at bedtime as needed for sleep., Starting 06/12/2014, Until Tue 07/17/14, Print     !! - Potential duplicate medications found. Please discuss with provider.     No Known Allergies Follow-up Information    Follow up with Molli Hazard, MD On 07/24/2014.   Specialties:  Hematology and Oncology, Oncology   Why:  Follow-up   Contact information:   [employer] [place] [address] [phone]        The results  of significant diagnostics from this hospitalization (including imaging, microbiology, ancillary and laboratory) are listed below for reference.    Significant Diagnostic Studies: Ct Abdomen Pelvis W Contrast  07/08/2014   CLINICAL DATA:  Diarrhea for 3 days. Lump in neck. History of metastatic breast cancer, appendectomy and hysterectomy.  EXAM: CT ABDOMEN AND PELVIS WITH CONTRAST  TECHNIQUE: Multidetector CT imaging of the abdomen and pelvis was performed using the standard protocol following bolus administration of intravenous contrast.  CONTRAST:  55mL OMNIPAQUE IOHEXOL 300 MG/ML SOLN, 135mL OMNIPAQUE IOHEXOL 300 MG/ML SOLN  COMPARISON:  Radiation CT July 02, 2014, and June 15, 2014 and MRI of the lumbar spine May 30, 2014 and CT of the abdomen and pelvis April 25, 2014  FINDINGS:  LUNG BASES: Included view of the lung bases are clear. Visualized heart and pericardium are unremarkable.  SOLID ORGANS: Diffuse hypodense hepatic metastasis, which are overall similar in number though, appears slightly more smaller suggesting treatment related changes. Stable dominant 3.6 cm lesion RIGHT lobe of the liver. Liver is now somewhat nodular. Status post cholecystectomy. Pancreas, adrenal glands and spleen are unremarkable.  GASTROINTESTINAL TRACT: Short segment of bowel wall thickening RIGHT lower quadrant associated with surgical anastomosis, axial 80/113, coronal 45/100. The stomach, and large bowel are normal in course and caliber without inflammatory changes. Status post appendectomy.  KIDNEYS/ URINARY TRACT: Kidneys are orthotopic, demonstrating symmetric enhancement. No nephrolithiasis, hydronephrosis or solid renal masses. The unopacified ureters are normal in course and caliber. Delayed imaging through the kidneys demonstrates symmetric prompt contrast excretion within the proximal urinary collecting system. Urinary bladder is partially distended and unremarkable.  PERITONEUM/RETROPERITONEUM: Aortoiliac vessels are normal in course and caliber. No lymphadenopathy by CT size criteria. Status post hysterectomy. No intraperitoneal free fluid nor free air.  SOFT TISSUE/OSSEOUS STRUCTURES: Diffuse osseous metastasis without pathologic fracture many of the lytic lesions are not sclerotic suggesting post treatment changes, however there may be new sclerotic metastasis. Anterior abdominal wall scarring.  IMPRESSION: Short symmetric segment of small bowel wall thickening or RIGHT lower quadrants is a with bowel anastomosis, however no bowel obstruction. This may be infectious or, inflammatory given the patient's history of treatment for breast cancer.  Diffuse hepatic metastasis are similar in number though generally smaller in size suggesting post treatment changes. Nodular liver contour, which could  reflect early cirrhosis.  Diffuse osseous metastasis, predominately sclerotic, this suggests a component of treatment related changes though, additional new sclerotic metastasis is not excluded. No pathologic fracture.   Electronically Signed   By: Elon Alas   On: 07/08/2014 03:38   Mr Shoulder Left Wo Contrast  06/28/2014   CLINICAL DATA:  Severe less shoulder pain and tenderness to touch. Painful range of motion. Metastatic breast cancer to bone and liver.  EXAM: MRI OF THE LEFT SHOULDER WITHOUT CONTRAST  TECHNIQUE: Multiplanar, multisequence MR imaging of the shoulder was performed. No intravenous contrast was administered.  COMPARISON:  PET-CT dated 05/16/2014  FINDINGS: Bones: There is a 2.0 x 1.6 x 1.0 cm metastasis in the posterior spine of the scapula with edema in the adjacent soft tissues and also extending into the origin of the deltoid muscle and the insertion of the left trapezius at that portion of the scapula. There small subcortical cystic degenerative changes in the posterior aspect of the humeral head.  Rotator cuff:  Normal.  Muscles:  The muscles of the rotator cuff are normal.  Biceps long head:  Properly located and intact.  Acromioclavicular Joint:  Normal.  Type 2 acromion.  No bursitis.  Glenohumeral Joint: Normal.  Labrum:  Normal.  IMPRESSION: Metastatic lesion in the spine of the scapula with edema extending into the adjacent posterior aspect of the deltoid muscle and in the adjacent insertion of the trapezius muscle. No pathologic fracture at this time.  No other significant abnormality.   Electronically Signed   By: Lorriane Shire M.D.   On: 06/28/2014 13:33   US Venous Img Upper Uni Right  07/09/2014   CLINICAL DATA:  Stage IV breast cancer, pain and swelling in RIGHT neck and chest  EXAM: RIGHT UPPER EXTREMITY VENOUS DOPPLER ULTRASOUND  TECHNIQUE: Gray-scale sonography with graded compression, as well as color Doppler and duplex ultrasound were performed to evaluate the  upper extremity deep venous system from the level of the subclavian vein and including the jugular, axillary, basilic, radial, ulnar and upper cephalic vein. Spectral Doppler was utilized to evaluate flow at rest and with distal augmentation maneuvers.  COMPARISON:  None  FINDINGS: Contralateral Subclavian Vein: Respiratory phasicity is normal and symmetric with the symptomatic side. No evidence of thrombus. Normal compressibility.  Internal Jugular Vein: Distended by heterogeneous hypoechoic thrombus compatible with acute deep venous thrombosis. No spontaneous flow. Vessel is noncompressible.  Subclavian Vein: Extension of RIGHT internal jugular vein deep venous thrombus into the medial RIGHT subclavian vein. Absent spontaneous flow within the involved segment.  Axillary Vein: No evidence of thrombus.  Normal compressibility.  Cephalic Vein: No evidence of thrombus.  Normal compressibility.  Basilic Vein: No evidence of thrombus.  Normal compressibility.  Brachial Veins: No evidence of thrombus.  Radial Veins: No evidence of thrombus.  Normal compressibility.  Ulnar Veins: No evidence of thrombus.  Normal compressibility.  Venous Reflux:  None visualized.  Other Findings: Impaired respiratory phasicity. Augmentation maneuver not performed.  IMPRESSION: Acute deep venous thrombosis involving the RIGHT internal jugular and medial RIGHT subclavian veins.  Remaining deep venous system of the RIGHT upper extremity appears patent.  Critical Value/emergent results were called by telephone at the time of interpretation on 07/09/2014 at 1240 hours to [facility] who verbally acknowledged these results.   Electronically Signed   By: Lavonia Dana M.D.   On: 07/09/2014 13:01    Microbiology: Recent Results (from the past 240 hour(s))  Clostridium Difficile by PCR     Status: None   Collection Time: 07/16/14  1:30 PM  Result Value Ref Range Status   C difficile by pcr NEGATIVE NEGATIVE Final  Ova and parasite  examination     Status: None   Collection Time: 07/16/14  1:30 PM  Result Value Ref Range Status   Specimen Description STOOL  Final   Special Requests NONE  Final   Ova and parasites   Final    NO OVA OR PARASITES SEEN Performed at [facility]    Report Status 07/17/2014 FINAL  Final     Labs: Basic Metabolic Panel:  Recent Labs Lab 07/16/14 1232 07/17/14 0645 07/19/14 0553 07/20/14 0635  NA 139 141 139 139  K 2.2* 3.6 3.2* 3.5  CL 107 112 110 110  CO2 23 24 26 24   GLUCOSE 196* 133* 142* 130*  BUN 7 5* <5* <5*  CREATININE 0.72 0.54 0.46* 0.50  CALCIUM 7.8* 7.0* 6.4* 6.9*  MG 2.1  --   --   --    Liver Function Tests:  Recent Labs Lab 07/16/14 1232 07/17/14 0645 07/19/14 0600  AST 30 30  --   ALT  37* 40*  --   ALKPHOS 74 63  --   BILITOT 0.4 0.2*  --   PROT 6.2 5.2*  --   ALBUMIN 3.2* 2.6* 2.4*   No results for input(s): LIPASE, AMYLASE in the last 168 hours. No results for input(s): AMMONIA in the last 168 hours. CBC:  Recent Labs Lab 07/16/14 1232 07/17/14 0645 07/18/14 0624 07/19/14 0553 07/19/14 0600 07/20/14 0635  WBC 4.3 3.4* 2.3* 2.9*  --  3.4*  NEUTROABS 3.3  --   --   --  1.9  --   HGB 12.6 11.2* 9.2* 10.4*  --  10.7*  HCT 38.5 34.5* 28.0* 32.2*  --  33.0*  MCV 93.4 94.5 95.2 95.5  --  95.4  PLT 144* 143* 119* 140*  --  147*   Cardiac Enzymes: No results for input(s): CKTOTAL, CKMB, CKMBINDEX, TROPONINI in the last 168 hours. BNP: BNP (last 3 results) No results for input(s): BNP in the last 8760 hours.  ProBNP (last 3 results) No results for input(s): PROBNP in the last 8760 hours.  CBG: No results for input(s): GLUCAP in the last 168 hours.     Signed:  Arsh Feutz  [facility] 07/20/2014, 8:11 PM

## 2014-07-20 NOTE — Progress Notes (Signed)
NURSING PROGRESS NOTE  [name] [id number] Discharge Data: 07/20/2014 6:53 PM Attending Provider: Kathie Dike, MD YVO:[id number] E, MD   [place] to be D/C'd Home per MD order.    All IV's discontinued and monitored for bleeding. Chest port deaccessed.  All belongings returned to patient for patient to take home.  AVS summary and prescriptions given to patient. Patient left floor via wheelchair, escorted by NT.  Last Documented Vital Signs:  Blood pressure 114/61, pulse 71, temperature 98.4 F (36.9 C), temperature source Oral, resp. rate 20, height 5\' 8"  (1.727 m), weight 105.008 kg (231 lb 8 oz), SpO2 98 %.  Cecilie Kicks D

## 2014-07-23 ENCOUNTER — Other Ambulatory Visit: Payer: 59

## 2014-07-23 ENCOUNTER — Encounter: Payer: 59 | Admitting: Genetic Counselor

## 2014-07-24 ENCOUNTER — Encounter: Payer: Self-pay | Admitting: *Deleted

## 2014-07-24 ENCOUNTER — Encounter (HOSPITAL_COMMUNITY): Payer: Self-pay | Admitting: Hematology & Oncology

## 2014-07-24 ENCOUNTER — Encounter (HOSPITAL_BASED_OUTPATIENT_CLINIC_OR_DEPARTMENT_OTHER): Payer: 59 | Admitting: Hematology & Oncology

## 2014-07-24 ENCOUNTER — Encounter (HOSPITAL_BASED_OUTPATIENT_CLINIC_OR_DEPARTMENT_OTHER): Payer: 59

## 2014-07-24 VITALS — BP 122/75 | HR 88 | Temp 98.4°F | Resp 16 | Wt 234.1 lb

## 2014-07-24 DIAGNOSIS — I82C11 Acute embolism and thrombosis of right internal jugular vein: Secondary | ICD-10-CM

## 2014-07-24 DIAGNOSIS — Z5112 Encounter for antineoplastic immunotherapy: Secondary | ICD-10-CM

## 2014-07-24 DIAGNOSIS — C50919 Malignant neoplasm of unspecified site of unspecified female breast: Secondary | ICD-10-CM

## 2014-07-24 DIAGNOSIS — C7951 Secondary malignant neoplasm of bone: Secondary | ICD-10-CM | POA: Diagnosis not present

## 2014-07-24 DIAGNOSIS — C787 Secondary malignant neoplasm of liver and intrahepatic bile duct: Secondary | ICD-10-CM

## 2014-07-24 DIAGNOSIS — M25519 Pain in unspecified shoulder: Secondary | ICD-10-CM

## 2014-07-24 DIAGNOSIS — C229 Malignant neoplasm of liver, not specified as primary or secondary: Secondary | ICD-10-CM | POA: Diagnosis not present

## 2014-07-24 DIAGNOSIS — C50812 Malignant neoplasm of overlapping sites of left female breast: Secondary | ICD-10-CM

## 2014-07-24 DIAGNOSIS — I82401 Acute embolism and thrombosis of unspecified deep veins of right lower extremity: Secondary | ICD-10-CM

## 2014-07-24 LAB — CBC WITH DIFFERENTIAL/PLATELET
Basophils Absolute: 0 10*3/uL (ref 0.0–0.1)
Basophils Relative: 0 % (ref 0–1)
EOS ABS: 0 10*3/uL (ref 0.0–0.7)
EOS PCT: 0 % (ref 0–5)
HCT: 36.1 % (ref 36.0–46.0)
HEMOGLOBIN: 11.8 g/dL — AB (ref 12.0–15.0)
LYMPHS ABS: 0.5 10*3/uL — AB (ref 0.7–4.0)
Lymphocytes Relative: 11 % — ABNORMAL LOW (ref 12–46)
MCH: 30.4 pg (ref 26.0–34.0)
MCHC: 32.7 g/dL (ref 30.0–36.0)
MCV: 93 fL (ref 78.0–100.0)
Monocytes Absolute: 0.5 10*3/uL (ref 0.1–1.0)
Monocytes Relative: 11 % (ref 3–12)
Neutro Abs: 3.5 10*3/uL (ref 1.7–7.7)
Neutrophils Relative %: 78 % — ABNORMAL HIGH (ref 43–77)
Platelets: 241 10*3/uL (ref 150–400)
RBC: 3.88 MIL/uL (ref 3.87–5.11)
RDW: 16.5 % — ABNORMAL HIGH (ref 11.5–15.5)
WBC: 4.6 10*3/uL (ref 4.0–10.5)

## 2014-07-24 LAB — COMPREHENSIVE METABOLIC PANEL
ALT: 16 U/L (ref 0–35)
ANION GAP: 8 (ref 5–15)
AST: 17 U/L (ref 0–37)
Albumin: 2.7 g/dL — ABNORMAL LOW (ref 3.5–5.2)
Alkaline Phosphatase: 77 U/L (ref 39–117)
BUN: 7 mg/dL (ref 6–23)
CALCIUM: 8.6 mg/dL (ref 8.4–10.5)
CO2: 26 mmol/L (ref 19–32)
CREATININE: 0.62 mg/dL (ref 0.50–1.10)
Chloride: 105 mmol/L (ref 96–112)
GFR calc non Af Amer: >90 mL/min (ref >90)
GLUCOSE: 162 mg/dL — AB (ref 70–99)
Potassium: 3.7 mmol/L (ref 3.5–5.1)
Sodium: 139 mmol/L (ref 135–145)
TOTAL PROTEIN: 5.7 g/dL — AB (ref 6.0–8.3)
Total Bilirubin: 0.2 mg/dL — ABNORMAL LOW (ref 0.3–1.2)

## 2014-07-24 MED ORDER — ACETAMINOPHEN 325 MG PO TABS
ORAL_TABLET | ORAL | Status: AC
  Filled 2014-07-24: qty 2

## 2014-07-24 MED ORDER — DIPHENHYDRAMINE HCL 25 MG PO CAPS
50.0000 mg | ORAL_CAPSULE | Freq: Once | ORAL | Status: AC
  Administered 2014-07-24: 50 mg via ORAL
  Filled 2014-07-24: qty 2

## 2014-07-24 MED ORDER — TRASTUZUMAB CHEMO INJECTION 440 MG
6.0000 mg/kg | Freq: Once | INTRAVENOUS | Status: AC
  Administered 2014-07-24: 672 mg via INTRAVENOUS
  Filled 2014-07-24: qty 32

## 2014-07-24 MED ORDER — SODIUM CHLORIDE 0.9 % IJ SOLN
10.0000 mL | INTRAMUSCULAR | Status: DC | PRN
  Administered 2014-07-24: 10 mL
  Filled 2014-07-24: qty 10

## 2014-07-24 MED ORDER — HEPARIN SOD (PORK) LOCK FLUSH 100 UNIT/ML IV SOLN
500.0000 [IU] | Freq: Once | INTRAVENOUS | Status: AC | PRN
  Administered 2014-07-24: 500 [IU]
  Filled 2014-07-24: qty 5

## 2014-07-24 MED ORDER — SODIUM CHLORIDE 0.9 % IV SOLN
Freq: Once | INTRAVENOUS | Status: AC
  Administered 2014-07-24: 10:00:00 via INTRAVENOUS

## 2014-07-24 MED ORDER — ACETAMINOPHEN 325 MG PO TABS
650.0000 mg | ORAL_TABLET | Freq: Once | ORAL | Status: AC
  Administered 2014-07-24: 650 mg via ORAL

## 2014-07-24 NOTE — Assessment & Plan Note (Signed)
49 year old female with stage IV carcinoma of the breast. ER positive and HER-2 positive. Her treatment course unfortunately his been somewhat complicated because of significant pain. Initially we felt targeted therapy only would be adequate. She however continue to have progressive pain and Taxotere was added. Her course has been complicated by neutropenic fever, and several courses of radiation for palliation. She seems to be doing fairly well today. We will proceed forward with treatment as planned. We are holding Taxotere because of ongoing radiation. She is to continue tamoxifen as prescribed. We will see her back again in 3 weeks at which time radiation will be finished and we can resume Taxotere.

## 2014-07-24 NOTE — Progress Notes (Signed)
[place] Clinical Social Work  Clinical Social Work was referred by [place] rounding for check in and assessment of psychosocial needs due to recent hospitalization.  Clinical Social Worker met with patient and her husband at [facility] during her visit to offer support and assess for needs. Pt shared she had been frustrated about her several weeks of diarrhea and family concerns. CSW provided supportive listening. Pt reports she is very eager to attend next Cancer Support Group in April and also Look Good Feel Better class. She states she was hospitalized for both in March and agrees to CSW to sign her up for these events. Pt shared she found a book very helpful in dealing with her family and others as she copes through her cancer journey. Health boundary setting has been helpful to her. CSW made referral to [facility] and registered pt for April Support Group.   Clinical Social Work interventions: Programme researcher, broadcasting/film/video education Resource referral  [name], [place] Tuesdays 8:30-1pm Wednesdays 8:30-12pm  [phone]

## 2014-07-24 NOTE — Patient Instructions (Signed)
[facility] Discharge Instructions for Patients Receiving Chemotherapy  Today you received the following chemotherapy agents herceptin this week. Dose reduction in taxotere next time.  Follow up 3 weeks with the doctor and next chemotherapy appt.  Return Friday for your x-geva appt.  If you have any questions or concerns before then please call the clinic.  To help prevent nausea and vomiting after your treatment, we encourage you to take your nausea medication    If you develop nausea and vomiting that is not controlled by your nausea medication, call the clinic. If it is after clinic hours your family physician or the after hours number for the clinic or go to the Emergency Department.   BELOW ARE SYMPTOMS THAT SHOULD BE REPORTED IMMEDIATELY:  *FEVER GREATER THAN 101.0 F  *CHILLS WITH OR WITHOUT FEVER  NAUSEA AND VOMITING THAT IS NOT CONTROLLED WITH YOUR NAUSEA MEDICATION  *UNUSUAL SHORTNESS OF BREATH  *UNUSUAL BRUISING OR BLEEDING  TENDERNESS IN MOUTH AND THROAT WITH OR WITHOUT PRESENCE OF ULCERS  *URINARY PROBLEMS  *BOWEL PROBLEMS  UNUSUAL RASH Items with * indicate a potential emergency and should be followed up as soon as possible.  One of the nurses will contact you 24 hours after your treatment. Please let the nurse know about any problems that you may have experienced. Feel free to call the clinic you have any questions or concerns. The clinic phone number is [phone].   I have been informed and understand all the instructions given to me. I know to contact the clinic, my physician, or go to the Emergency Department if any problems should occur. I do not have any questions at this time, but understand that I may call the clinic during office hours or the Patient Navigator at [phone] should I have any questions or need assistance in obtaining follow up care.

## 2014-07-24 NOTE — Progress Notes (Signed)
[name] Amey Tolerated chemotherapy today.  Discharged ambulatory

## 2014-07-24 NOTE — Progress Notes (Signed)
[name], MD [place] [address]  Stage IV adenocarcinoma with metastases to liver and bone  Had last mammogram in [place] this year, Dr. Gaetano Net  Colonoscopy 05/18/2013  Breast Reduction 03/17/2011  INTERVAL HISTORY: [name] 49 y.o. female returns for follow-up of stage IV adenocarcinoma. Her primary site it felt to be breast.  Her abdominal pain is finally markedly improved. She finished radiation to her spine on the 26th and started radiation on the 24th to her left shoulder. She is interested in pursuing genetic testing but once to wait until she feels better. She has noticed some mild tenderness in the right neck around her port for the last 2 days. She denies any swelling. She is also noted a low-grade temperature of 99 for the last 3-4 days. She denies any chills, dysuria, cough, or other concerning symptoms.   MEDICAL HISTORY: Past Medical History  Diagnosis Date  . PONV (postoperative nausea and vomiting)     pt states scope patch does well  . Anemia   . Depression   . Anxiety   . Headache(784.0)     has migraines - medication controls  . Breast cancer 04/2014    Presumed dx; stage 4 w/ mets to bone and liver  . Bony metastasis     has Hypertrophy of breast; Chest pain, rule out acute myocardial infarction; Hyperlipidemia; Depression; Hypokalemia; Elevated LFTs; Liver lesion; Intractable abdominal pain; Breast cancer, stage 4; Metastatic adenocarcinoma; Bone metastases; Epigastric abdominal pain; Breast cancer; Fever; Pancytopenia; Neutropenic fever; Bony metastasis; Mucositis due to chemotherapy; Diarrhea; DVT (deep venous thrombosis); Dehydration; Orthostatic hypotension; and GERD (gastroesophageal reflux disease) on her problem list.      Breast cancer, stage 4   04/25/2014 Initial Diagnosis Breast cancer, stage 4   04/25/2014 Imaging CT abdomen/pelvia with widespread metastatic disease to the liver, multiple lytic lesions throughout  spine and pelvis. No FX or epidural tumor identified   04/26/2014 Imaging CT head unremarkable   04/26/2014 Imaging CT chest with no lung mass or pulmonary nodules, no adenopathy. Lytic bone lesions, right 2nd rib   04/27/2014 Initial Biopsy U/S guided liver biopsy, lesion in anterior and inferior left hepatic lobe biopsied   04/27/2014 Pathology Results Metastatic adenocarcinoma, CK7, ER+, patchy positivity with PR. Possible primary includes breast, less likely gynecologic   05/15/2014 Mammogram BI-RADS CATEGORY  2: Benign Finding(s)   05/16/2014 PET scan 1. Intensely hypermetabolic hepatic metastasis. 2. Widespread hypermetabolic skeletal lesions. 3. No primary adenocarcinoma identified by FDG PET imaging.   05/19/2014 Imaging MUGA- Left ventricular ejection fracture greater than 70%.   05/21/2014 Breast MRI No suspicious masses or enhancement within the breasts. No axillary adenopathy.   05/22/2014 -  Antibody Plan Herceptin/Perjeta/Tamoxifen   06/12/2014 -  Chemotherapy Taxotere added secondary to persistent abdominal and back pain   06/17/2014 - 06/19/2014 Hospital Admission Neutropenia, fever, diarrhea, nausea, vomiting   06/20/2014 - 07/10/2014 Radiation Therapy Dr. Thea Silversmith 12 fractions to L3-S3 (30 Gy) and left scapula (20 Gy).    07/16/2014 -  Hospital Admission Electrolyte abnormalities, and diarrhea.  Suspect Perjeta-induced diarrhea.  Negative GI work-up.     has No Known Allergies.  Ms. Howeth had no medications administered during this visit.  SURGICAL HISTORY: Past Surgical History  Procedure Laterality Date  . Right rotator cuff      2002  . Neck fusion      2003  . Laparoscopic cholecystectomy      2004  .  Right knee arthroscopy      2005  . Colon surgery      2008  . Appendectomy      2008  . Abdominal hysterectomy    . Breast reduction surgery  03/17/2011    Procedure: MAMMARY REDUCTION BILATERAL (BREAST);  Surgeon: Mary A Contogiannis;  Location: [facility];  Service: Plastics;  Laterality: Bilateral;  . Colonoscopy N/A 07/13/2013    Procedure: COLONOSCOPY;  Surgeon: Najeeb U Rehman, MD;  [facility];  Service: Endoscopy;  Laterality: N/A;  930  . Liver biopsy  04/2014  . Portacath placement    . Esophagogastroduodenoscopy N/A 05/25/2014    Procedure: ESOPHAGOGASTRODUODENOSCOPY (EGD);  Surgeon: Najeeb U Rehman, MD;  [facility];  Service: Endoscopy;  Laterality: N/A;  155    SOCIAL HISTORY: History   Social History  . Marital Status: Married    Spouse Name: N/A  . Number of Children: N/A  . Years of Education: N/A   Occupational History  . Not on file.   Social History Main Topics  . Smoking status: Former Smoker    Types: Cigarettes  . Smokeless tobacco: Never Used  . Alcohol Use: Yes     Comment: Occasionally  . Drug Use: No  . Sexual Activity: Yes    Birth Control/ Protection: Surgical   Other Topics Concern  . Not on file   Social History Narrative    FAMILY HISTORY: Family History  Problem Relation Age of Onset  . Colon cancer Neg Hx   . Diabetes Father   . Heart attack Maternal Grandmother 30    multiple over lifetime.    Review of Systems  Constitutional: Positive for malaise/fatigue. Negative for fever, chills and weight loss.  HENT: Negative for congestion, hearing loss, nosebleeds, sore throat and tinnitus.   Eyes: Negative for blurred vision, double vision, pain and discharge.  Respiratory: Negative for cough, hemoptysis, sputum production, shortness of breath and wheezing.   Cardiovascular: Negative for chest pain, palpitations, claudication, leg swelling and PND.  Gastrointestinal: Negative for heartburn, nausea, vomiting, abdominal pain, diarrhea, constipation, blood in stool and melena.  Genitourinary: Negative for dysuria, urgency, frequency and hematuria.  Musculoskeletal: Positive for back pain. Negative for myalgias, joint pain and falls.       Shoulder pain  Skin:  Negative for itching and rash.  Neurological: Negative for dizziness, tingling, tremors, sensory change, speech change, focal weakness, seizures, loss of consciousness, weakness and headaches.  Endo/Heme/Allergies: Does not bruise/bleed easily.  Psychiatric/Behavioral: Negative for depression, suicidal ideas, memory loss and substance abuse. The patient is not nervous/anxious and does not have insomnia.     PHYSICAL EXAMINATION  ECOG PERFORMANCE STATUS: 1 - Symptomatic but completely ambulatory  Filed Vitals:   07/03/14 0900  BP: 120/66  Pulse: 107  Temp: 98.2 F (36.8 C)  Resp: 16    Physical Exam  Constitutional: She is oriented to person, place, and time and well-developed, well-nourished, and in no distress.  HENT:  Head: Normocephalic and atraumatic.  Nose: Nose normal.  Mouth/Throat: Oropharynx is clear and moist. No oropharyngeal exudate.  Eyes: Conjunctivae and EOM are normal. Pupils are equal, round, and reactive to light. Right eye exhibits no discharge. Left eye exhibits no discharge. No scleral icterus.  Neck: Normal range of motion. Neck supple. No tracheal deviation present. No thyromegaly present.  Cardiovascular: Normal rate, regular rhythm and normal heart sounds.  Exam reveals no gallop and no friction rub.   No   murmur heard. Pulmonary/Chest: Effort normal and breath sounds normal. She has no wheezes. She has no rales.  Abdominal: Soft. Bowel sounds are normal. She exhibits no distension and no mass. There is no tenderness. There is no rebound and no guarding.  Musculoskeletal: Normal range of motion. She exhibits no edema.  Lymphadenopathy:    She has no cervical adenopathy.  Neurological: She is alert and oriented to person, place, and time. She has normal reflexes. No cranial nerve deficit. Gait normal. Coordination normal.  Skin: Skin is warm and dry. No rash noted.  Psychiatric: Mood, memory, affect and judgment normal.  Nursing note and vitals  reviewed.   LABORATORY DATA:  CBC    Component Value Date/Time   WBC 4.6 07/24/2014 0854   RBC 3.88 07/24/2014 0854   HGB 11.8* 07/24/2014 0854   HCT 36.1 07/24/2014 0854   PLT 241 07/24/2014 0854   MCV 93.0 07/24/2014 0854   MCH 30.4 07/24/2014 0854   MCHC 32.7 07/24/2014 0854   RDW 16.5* 07/24/2014 0854   LYMPHSABS 0.5* 07/24/2014 0854   MONOABS 0.5 07/24/2014 0854   EOSABS 0.0 07/24/2014 0854   BASOSABS 0.0 07/24/2014 0854   CMP     Component Value Date/Time   NA 139 07/24/2014 0854   K 3.7 07/24/2014 0854   CL 105 07/24/2014 0854   CO2 26 07/24/2014 0854   GLUCOSE 162* 07/24/2014 0854   BUN 7 07/24/2014 0854   CREATININE 0.62 07/24/2014 0854   CALCIUM 8.6 07/24/2014 0854   PROT 5.7* 07/24/2014 0854   ALBUMIN 2.7* 07/24/2014 0854   AST 17 07/24/2014 0854   ALT 16 07/24/2014 0854   ALKPHOS 77 07/24/2014 0854   BILITOT 0.2* 07/24/2014 0854   GFRNONAA >90 07/24/2014 0854   GFRAA >90 07/24/2014 0854      ASSESSMENT and THERAPY PLAN:    Breast cancer, stage [age] year old female with stage IV carcinoma of the breast. ER positive and HER-2 positive. Her treatment course unfortunately his been somewhat complicated because of significant pain. Initially we felt targeted therapy only would be adequate. She however continue to have progressive pain and Taxotere was added. Her course has been complicated by neutropenic fever, and several courses of radiation for palliation. She seems to be doing fairly well today. We will proceed forward with treatment as planned. We are holding Taxotere because of ongoing radiation. She is to continue tamoxifen as prescribed. We will see her back again in 3 weeks at which time radiation will be finished and we can resume Taxotere.    Low-grade temperatures  She was advised to continue to monitor these and notify us should she have a temperature spike of 100.5 or higher. In addition to notify us if she develops any symptoms such as cough,  dysuria, urinary urgency, diarrhea, sore throat, or other evidence of infection.   All questions were answered. The patient knows to call the clinic with any problems, questions or concerns. We can certainly see the patient much sooner if necessary. This note was signed electronically  Molli Hazard 07/24/2014

## 2014-07-25 LAB — CANCER ANTIGEN 27.29: CA 27.29: 245.6 U/mL — ABNORMAL HIGH (ref 0.0–38.6)

## 2014-07-25 NOTE — Assessment & Plan Note (Signed)
As an 49 year old female who has been diagnosed with stage IV ER positive, HER-2 positive carcinoma of the breast. [name] is overall responding well to therapy but course has been extremely complicated. She has had several chemotherapy complications including neutropenic fever, and recent grade 3/4 diarrhea felt to be secondary to Perjeta. She has required radiation for several sites of severe pain including the low back and shoulder. She has also been treated for an upper extremity DVT  We will proceed forward with Herceptin only today.Iif she has made significant progress by her next follow-up in 3 weeks we will proceed forward with Taxotere and Herceptin to complete 4 cycles of Taxotere. Received with progenitor at this point is uncertain and I will discuss this with some of my partners in [place]. I have advised her if she has difficulties after this treatment today to notify us immediately

## 2014-07-25 NOTE — Assessment & Plan Note (Signed)
She continues on Lovenox for a right internal jugular vein thrombosis. Swelling and pain are gone. The plan is for her to continue Lovenox moving forward.

## 2014-07-25 NOTE — Progress Notes (Signed)
[name], MD [place] [address]  Stage IV adenocarcinoma with metastases to liver and bone  Had last mammogram in [place] this year, Dr. Gaetano Net  Colonoscopy 05/18/2013  Breast Reduction 03/17/2011  CURRENT THERAPY: Perjeta/Herceptin/Tamoxifen  INTERVAL HISTORY: [name] 49 y.o. female returns for follow-up of stage IV adenocarcinoma. Her primary site it felt to be breast. He was admitted to the hospital with grade 3/4 diarrhea. If finally responded to octreotide, Lomotil, and Imodium. All infectious causes were ruled out. We are quite concerned that it was secondary to perjeta.  We have had to hold Taxotere therapy secondary to the need for radiation because of pain. And in general her chemotherapy has been interrupted. Certainly not ideal, but she is making steady progress and doing better.  Today she has few complaints. Her diarrhea is gone except for 2 episodes yesterday that promptly responded to Imodium. She states her mood is good. She is here today for Herceptin only.    MEDICAL HISTORY: Past Medical History  Diagnosis Date  . PONV (postoperative nausea and vomiting)     pt states scope patch does well  . Anemia   . Depression   . Anxiety   . Headache(784.0)     has migraines - medication controls  . Breast cancer 04/2014    Presumed dx; stage 4 w/ mets to bone and liver  . Bony metastasis     has Hypertrophy of breast; Chest pain, rule out acute myocardial infarction; Hyperlipidemia; Depression; Hypokalemia; Elevated LFTs; Liver lesion; Intractable abdominal pain; Breast cancer, stage 4; Metastatic adenocarcinoma; Bone metastases; Epigastric abdominal pain; Breast cancer; Fever; Pancytopenia; Neutropenic fever; Bony metastasis; Mucositis due to chemotherapy; Diarrhea; DVT (deep venous thrombosis); Dehydration; Orthostatic hypotension; and GERD (gastroesophageal reflux disease) on her problem list.      Breast cancer, stage 4   04/25/2014 Initial Diagnosis Breast cancer, stage 4   04/25/2014 Imaging CT abdomen/pelvia with widespread metastatic disease to the liver, multiple lytic lesions throughout spine and pelvis. No FX or epidural tumor identified   04/26/2014 Imaging CT head unremarkable   04/26/2014 Imaging CT chest with no lung mass or pulmonary nodules, no adenopathy. Lytic bone lesions, right 2nd rib   04/27/2014 Initial Biopsy U/S guided liver biopsy, lesion in anterior and inferior left hepatic lobe biopsied   04/27/2014 Pathology Results Metastatic adenocarcinoma, CK7, ER+, patchy positivity with PR. Possible primary includes breast, less likely gynecologic   05/15/2014 Mammogram BI-RADS CATEGORY  2: Benign Finding(s)   05/16/2014 PET scan 1. Intensely hypermetabolic hepatic metastasis. 2. Widespread hypermetabolic skeletal lesions. 3. No primary adenocarcinoma identified by FDG PET imaging.   05/19/2014 Imaging MUGA- Left ventricular ejection fracture greater than 70%.   05/21/2014 Breast MRI No suspicious masses or enhancement within the breasts. No axillary adenopathy.   05/22/2014 -  Antibody Plan Herceptin/Perjeta/Tamoxifen   06/12/2014 -  Chemotherapy Taxotere added secondary to persistent abdominal and back pain   06/17/2014 - 06/19/2014 Hospital Admission Neutropenia, fever, diarrhea, nausea, vomiting   06/20/2014 - 07/10/2014 Radiation Therapy Dr. Thea Silversmith 12 fractions to L3-S3 (30 Gy) and left scapula (20 Gy).    07/16/2014 -  Hospital Admission Electrolyte abnormalities, and diarrhea.  Suspect Perjeta-induced diarrhea.  Negative GI work-up.     has No Known Allergies.  Ms. Whetstone had no medications administered during this visit.  SURGICAL HISTORY: Past Surgical History  Procedure Laterality Date  . Right rotator cuff      2002  .  Neck fusion      2003  . Laparoscopic cholecystectomy      2004  . Right knee arthroscopy      2005  . Colon surgery      2008  . Appendectomy      2008  . Abdominal  hysterectomy    . Breast reduction surgery  03/17/2011    Procedure: MAMMARY REDUCTION BILATERAL (BREAST);  Surgeon: Mary A Contogiannis;  Location: [facility];  Service: Plastics;  Laterality: Bilateral;  . Colonoscopy N/A 07/13/2013    Procedure: COLONOSCOPY;  Surgeon: Rogene Houston, MD;  [facility];  Service: Endoscopy;  Laterality: N/A;  930  . Liver biopsy  04/2014  . Portacath placement    . Esophagogastroduodenoscopy N/A 05/25/2014    Procedure: ESOPHAGOGASTRODUODENOSCOPY (EGD);  Surgeon: Rogene Houston, MD;  [facility];  Service: Endoscopy;  Laterality: N/A;  155    SOCIAL HISTORY: History   Social History  . Marital Status: Married    Spouse Name: N/A  . Number of Children: N/A  . Years of Education: N/A   Occupational History  . Not on file.   Social History Main Topics  . Smoking status: Former Smoker    Types: Cigarettes  . Smokeless tobacco: Never Used  . Alcohol Use: Yes     Comment: Occasionally  . Drug Use: No  . Sexual Activity: Yes    Birth Control/ Protection: Surgical   Other Topics Concern  . Not on file   Social History Narrative    FAMILY HISTORY: Family History  Problem Relation Age of Onset  . Colon cancer Neg Hx   . Diabetes Father   . Heart attack Maternal Grandmother 30    multiple over lifetime.    Review of Systems  Constitutional: Negative for fever, chills, weight loss and malaise/fatigue.  HENT: Negative for congestion, hearing loss, nosebleeds, sore throat and tinnitus.   Eyes: Negative for blurred vision, double vision, pain and discharge.  Respiratory: Negative for cough, hemoptysis, sputum production, shortness of breath and wheezing.   Cardiovascular: Negative for chest pain, palpitations, claudication, leg swelling and PND.  Gastrointestinal: Negative for heartburn, nausea, vomiting, abdominal pain, diarrhea, constipation, blood in stool and melena.       Abdominal tenderness    Genitourinary: Negative for dysuria, urgency, frequency and hematuria.  Musculoskeletal: Negative for myalgias, joint pain and falls.  Skin: Negative for itching and rash.  Neurological: Negative for dizziness, tingling, tremors, sensory change, speech change, focal weakness, seizures, loss of consciousness, weakness and headaches.  Endo/Heme/Allergies: Does not bruise/bleed easily.  Psychiatric/Behavioral: Negative for depression, suicidal ideas, memory loss and substance abuse. The patient is not nervous/anxious and does not have insomnia.     PHYSICAL EXAMINATION  ECOG PERFORMANCE STATUS: 1 - Symptomatic but completely ambulatory  Filed Vitals:   07/24/14 0833  BP: 122/75  Pulse: 88  Temp: 98.4 F (36.9 C)  Resp: 16    Physical Exam  Constitutional: She is oriented to person, place, and time and well-developed, well-nourished, and in no distress.  alopecia  HENT:  Head: Normocephalic and atraumatic.  Nose: Nose normal.  Mouth/Throat: Oropharynx is clear and moist. No oropharyngeal exudate.  Eyes: Conjunctivae and EOM are normal. Pupils are equal, round, and reactive to light. Right eye exhibits no discharge. Left eye exhibits no discharge. No scleral icterus.  Neck: Normal range of motion. No tracheal deviation present. No thyromegaly present.  Cardiovascular: Normal rate, regular rhythm and  normal heart sounds.  Exam reveals no gallop and no friction rub.   No murmur heard. Pulmonary/Chest: Effort normal and breath sounds normal. She has no wheezes. She has no rales.  Abdominal: Soft. Bowel sounds are normal. She exhibits no distension and no mass. There is no tenderness. There is no rebound and no guarding.  Musculoskeletal: Normal range of motion. She exhibits no edema.  Lymphadenopathy:    She has no cervical adenopathy.  Neurological: She is alert and oriented to person, place, and time. She has normal reflexes. No cranial nerve deficit. Gait normal. Coordination  normal.  Skin: Skin is warm and dry. No rash noted.  Psychiatric: Mood, memory, affect and judgment normal.  Nursing note and vitals reviewed.   LABORATORY DATA:  CBC    Component Value Date/Time   WBC 4.6 07/24/2014 0854   RBC 3.88 07/24/2014 0854   HGB 11.8* 07/24/2014 0854   HCT 36.1 07/24/2014 0854   PLT 241 07/24/2014 0854   MCV 93.0 07/24/2014 0854   MCH 30.4 07/24/2014 0854   MCHC 32.7 07/24/2014 0854   RDW 16.5* 07/24/2014 0854   LYMPHSABS 0.5* 07/24/2014 0854   MONOABS 0.5 07/24/2014 0854   EOSABS 0.0 07/24/2014 0854   BASOSABS 0.0 07/24/2014 0854   CMP     Component Value Date/Time   NA 139 07/24/2014 0854   K 3.7 07/24/2014 0854   CL 105 07/24/2014 0854   CO2 26 07/24/2014 0854   GLUCOSE 162* 07/24/2014 0854   BUN 7 07/24/2014 0854   CREATININE 0.62 07/24/2014 0854   CALCIUM 8.6 07/24/2014 0854   PROT 5.7* 07/24/2014 0854   ALBUMIN 2.7* 07/24/2014 0854   AST 17 07/24/2014 0854   ALT 16 07/24/2014 0854   ALKPHOS 77 07/24/2014 0854   BILITOT 0.2* 07/24/2014 0854   GFRNONAA >90 07/24/2014 0854   GFRAA >90 07/24/2014 0854      ASSESSMENT and THERAPY PLAN:    Breast cancer, stage 57 As an 49 year old female who has been diagnosed with stage IV ER positive, HER-2 positive carcinoma of the breast. Christia Reading is overall responding well to therapy but course has been extremely complicated. She has had several chemotherapy complications including neutropenic fever, and recent grade 3/4 diarrhea felt to be secondary to Perjeta. She has required radiation for several sites of severe pain including the low back and shoulder. She has also been treated for an upper extremity DVT  We will proceed forward with Herceptin only today.Iif she has made significant progress by her next follow-up in 3 weeks we will proceed forward with Taxotere and Herceptin to complete 4 cycles of Taxotere. Received with progenitor at this point is uncertain and I will discuss this with some of  my partners in [place]. I have advised her if she has difficulties after this treatment today to notify us immediately   DVT (deep venous thrombosis) She continues on Lovenox for a right internal jugular vein thrombosis. Swelling and pain are gone. The plan is for her to continue Lovenox moving forward.    All questions were answered. The patient knows to call the clinic with any problems, questions or concerns. We can certainly see the patient much sooner if necessary.   Molli Hazard 07/25/2014

## 2014-07-26 ENCOUNTER — Other Ambulatory Visit: Payer: 59

## 2014-07-26 ENCOUNTER — Ambulatory Visit (HOSPITAL_COMMUNITY): Payer: 59

## 2014-07-27 ENCOUNTER — Encounter (HOSPITAL_BASED_OUTPATIENT_CLINIC_OR_DEPARTMENT_OTHER): Payer: 59

## 2014-07-27 ENCOUNTER — Encounter (HOSPITAL_COMMUNITY): Payer: Self-pay

## 2014-07-27 DIAGNOSIS — C50919 Malignant neoplasm of unspecified site of unspecified female breast: Secondary | ICD-10-CM

## 2014-07-27 DIAGNOSIS — C7951 Secondary malignant neoplasm of bone: Secondary | ICD-10-CM | POA: Diagnosis not present

## 2014-07-27 MED ORDER — OXYCODONE HCL 5 MG PO TABS: 5.0000 mg | ORAL_TABLET | ORAL | Status: DC | PRN

## 2014-07-27 MED ORDER — MORPHINE SULFATE ER 15 MG PO TBCR: 15.0000 mg | EXTENDED_RELEASE_TABLET | Freq: Two times a day (BID) | ORAL | Status: DC

## 2014-07-27 MED ORDER — DENOSUMAB 120 MG/1.7ML ~~LOC~~ SOLN
120.0000 mg | Freq: Once | SUBCUTANEOUS | Status: AC
  Administered 2014-07-27: 120 mg via SUBCUTANEOUS
  Filled 2014-07-27: qty 1.7

## 2014-07-27 NOTE — Progress Notes (Signed)
Quinlyn H Bozzo's reason for visit today is for an injection  as scheduled per MD orders. Elida H Goodhart also received x-geva per MD orders; see MAR for administration details.  [name] tolerated all procedures well and without incident; questions were answered and patient was discharged.

## 2014-07-27 NOTE — Patient Instructions (Signed)
[place] at [facility]  Discharge Instructions:  You had your x-geva today.  Please follow up as scheduled.  Call the clinic if you have any questions or concerns _______________________________________________________________  Thank you for choosing [place] at [facility] to provide your oncology and hematology care.  To afford each patient quality time with our providers, please arrive at least 15 minutes before your scheduled appointment.  You need to re-schedule your appointment if you arrive 10 or more minutes late.  We strive to give you quality time with our providers, and arriving late affects you and other patients whose appointments are after yours.  Also, if you no show three or more times for appointments you may be dismissed from the clinic.  Again, thank you for choosing [place] at [facility] hope is that these requests will allow you access to exceptional care and in a timely manner. _______________________________________________________________  If you have questions after your visit, please contact our office at [phone] between the hours of 8:30 a.m. and 5:00 p.m. Voicemails left after 4:30 p.m. will not be returned until the following business day. _______________________________________________________________  For prescription refill requests, have your pharmacy contact our office. _______________________________________________________________  Recommendations made by the consultant and any test results will be sent to your referring physician. _______________________________________________________________

## 2014-08-08 ENCOUNTER — Other Ambulatory Visit (HOSPITAL_COMMUNITY): Payer: Self-pay | Admitting: *Deleted

## 2014-08-08 DIAGNOSIS — C7951 Secondary malignant neoplasm of bone: Secondary | ICD-10-CM

## 2014-08-08 DIAGNOSIS — C50919 Malignant neoplasm of unspecified site of unspecified female breast: Secondary | ICD-10-CM

## 2014-08-08 MED ORDER — OXYCODONE HCL 5 MG PO TABS: 5.0000 mg | ORAL_TABLET | ORAL | Status: DC | PRN

## 2014-08-08 MED ORDER — MORPHINE SULFATE ER 30 MG PO TBCR: 30.0000 mg | EXTENDED_RELEASE_TABLET | Freq: Two times a day (BID) | ORAL | Status: DC

## 2014-08-10 ENCOUNTER — Other Ambulatory Visit (HOSPITAL_COMMUNITY): Payer: Self-pay | Admitting: Oncology

## 2014-08-10 ENCOUNTER — Other Ambulatory Visit (HOSPITAL_COMMUNITY): Payer: Self-pay | Admitting: Hematology & Oncology

## 2014-08-10 DIAGNOSIS — C50919 Malignant neoplasm of unspecified site of unspecified female breast: Secondary | ICD-10-CM

## 2014-08-10 MED ORDER — TAMOXIFEN CITRATE 20 MG PO TABS: 20.0000 mg | ORAL_TABLET | Freq: Every day | ORAL | Status: DC

## 2014-08-14 ENCOUNTER — Encounter (HOSPITAL_COMMUNITY): Payer: 59 | Attending: Hematology & Oncology

## 2014-08-14 ENCOUNTER — Encounter: Payer: Self-pay | Admitting: *Deleted

## 2014-08-14 ENCOUNTER — Encounter (HOSPITAL_COMMUNITY): Payer: Self-pay | Admitting: Hematology & Oncology

## 2014-08-14 ENCOUNTER — Encounter (HOSPITAL_BASED_OUTPATIENT_CLINIC_OR_DEPARTMENT_OTHER): Payer: 59 | Admitting: Hematology & Oncology

## 2014-08-14 VITALS — BP 123/78 | HR 97 | Temp 97.7°F | Resp 18 | Wt 236.0 lb

## 2014-08-14 DIAGNOSIS — C787 Secondary malignant neoplasm of liver and intrahepatic bile duct: Secondary | ICD-10-CM

## 2014-08-14 DIAGNOSIS — C50919 Malignant neoplasm of unspecified site of unspecified female breast: Secondary | ICD-10-CM

## 2014-08-14 DIAGNOSIS — I82C11 Acute embolism and thrombosis of right internal jugular vein: Secondary | ICD-10-CM

## 2014-08-14 DIAGNOSIS — C7951 Secondary malignant neoplasm of bone: Secondary | ICD-10-CM

## 2014-08-14 DIAGNOSIS — Z5111 Encounter for antineoplastic chemotherapy: Secondary | ICD-10-CM | POA: Diagnosis not present

## 2014-08-14 DIAGNOSIS — M25512 Pain in left shoulder: Secondary | ICD-10-CM

## 2014-08-14 DIAGNOSIS — C229 Malignant neoplasm of liver, not specified as primary or secondary: Secondary | ICD-10-CM | POA: Insufficient documentation

## 2014-08-14 LAB — CBC WITH DIFFERENTIAL/PLATELET
BASOS PCT: 0 % (ref 0–1)
Basophils Absolute: 0 10*3/uL (ref 0.0–0.1)
Eosinophils Absolute: 0 10*3/uL (ref 0.0–0.7)
Eosinophils Relative: 0 % (ref 0–5)
HCT: 40.2 % (ref 36.0–46.0)
HEMOGLOBIN: 13.1 g/dL (ref 12.0–15.0)
LYMPHS PCT: 7 % — AB (ref 12–46)
Lymphs Abs: 0.9 10*3/uL (ref 0.7–4.0)
MCH: 30.9 pg (ref 26.0–34.0)
MCHC: 32.6 g/dL (ref 30.0–36.0)
MCV: 94.8 fL (ref 78.0–100.0)
MONOS PCT: 5 % (ref 3–12)
Monocytes Absolute: 0.6 10*3/uL (ref 0.1–1.0)
NEUTROS ABS: 11.6 10*3/uL — AB (ref 1.7–7.7)
Neutrophils Relative %: 88 % — ABNORMAL HIGH (ref 43–77)
Platelets: 262 10*3/uL (ref 150–400)
RBC: 4.24 MIL/uL (ref 3.87–5.11)
RDW: 15.9 % — ABNORMAL HIGH (ref 11.5–15.5)
WBC: 13.1 10*3/uL — AB (ref 4.0–10.5)

## 2014-08-14 LAB — COMPREHENSIVE METABOLIC PANEL
ALK PHOS: 63 U/L (ref 39–117)
ALT: 19 U/L (ref 0–35)
ANION GAP: 9 (ref 5–15)
AST: 21 U/L (ref 0–37)
Albumin: 3.5 g/dL (ref 3.5–5.2)
BUN: 11 mg/dL (ref 6–23)
CO2: 26 mmol/L (ref 19–32)
Calcium: 9.3 mg/dL (ref 8.4–10.5)
Chloride: 104 mmol/L (ref 96–112)
Creatinine, Ser: 0.68 mg/dL (ref 0.50–1.10)
GLUCOSE: 162 mg/dL — AB (ref 70–99)
POTASSIUM: 4 mmol/L (ref 3.5–5.1)
Sodium: 139 mmol/L (ref 135–145)
Total Bilirubin: 0.2 mg/dL — ABNORMAL LOW (ref 0.3–1.2)
Total Protein: 6.6 g/dL (ref 6.0–8.3)

## 2014-08-14 MED ORDER — ENOXAPARIN SODIUM 150 MG/ML ~~LOC~~ SOLN
150.0000 mg | Freq: Once | SUBCUTANEOUS | Status: AC
  Administered 2014-08-14: 150 mg via SUBCUTANEOUS
  Filled 2014-08-14: qty 1

## 2014-08-14 MED ORDER — ONDANSETRON HCL 8 MG PO TABS: 8.0000 mg | ORAL_TABLET | Freq: Three times a day (TID) | ORAL | Status: DC | PRN

## 2014-08-14 MED ORDER — ENOXAPARIN SODIUM 150 MG/ML ~~LOC~~ SOLN: 150.0000 mg | Freq: Once | SUBCUTANEOUS | Status: DC

## 2014-08-14 MED ORDER — TRASTUZUMAB CHEMO INJECTION 440 MG
6.0000 mg/kg | Freq: Once | INTRAVENOUS | Status: AC
  Administered 2014-08-14: 672 mg via INTRAVENOUS
  Filled 2014-08-14: qty 32

## 2014-08-14 MED ORDER — ACETAMINOPHEN 325 MG PO TABS
ORAL_TABLET | ORAL | Status: AC
  Filled 2014-08-14: qty 2

## 2014-08-14 MED ORDER — SODIUM CHLORIDE 0.9 % IV SOLN
Freq: Once | INTRAVENOUS | Status: AC
  Administered 2014-08-14: 11:00:00 via INTRAVENOUS

## 2014-08-14 MED ORDER — ACETAMINOPHEN 325 MG PO TABS
650.0000 mg | ORAL_TABLET | Freq: Once | ORAL | Status: AC
  Administered 2014-08-14: 650 mg via ORAL

## 2014-08-14 MED ORDER — DIPHENHYDRAMINE HCL 25 MG PO CAPS
50.0000 mg | ORAL_CAPSULE | Freq: Once | ORAL | Status: AC
  Administered 2014-08-14: 50 mg via ORAL

## 2014-08-14 MED ORDER — DIPHENHYDRAMINE HCL 25 MG PO CAPS
ORAL_CAPSULE | ORAL | Status: AC
  Filled 2014-08-14: qty 2

## 2014-08-14 MED ORDER — SODIUM CHLORIDE 0.9 % IJ SOLN: 10.0000 mL | INTRAMUSCULAR | Status: DC | PRN

## 2014-08-14 MED ORDER — HEPARIN SOD (PORK) LOCK FLUSH 100 UNIT/ML IV SOLN
500.0000 [IU] | Freq: Once | INTRAVENOUS | Status: AC | PRN
  Administered 2014-08-14: 500 [IU]
  Filled 2014-08-14: qty 5

## 2014-08-14 NOTE — Progress Notes (Signed)
[place] Social Work  Clinical Social Work was referred by [place] [place] rounding for assessment of psychosocial needs due to follow up for previous concerns.  Clinical Social Worker met with patient at [facility] to offer support and assess for needs.  Pt reports to be doing very well currently and recently went to the beach with family. She plans to attend [facility] tomorrow and Support Group next week. CSW discussed details about the group. She was here with her sister today and is glad she has energy to attend support programs and spend time with family. No other needs noted. CSW to follow and assist.   Clinical Social Work interventions: Resource education Supportive listening  [name], [place] Tuesdays 8:30-1pm Wednesdays 8:30-12pm  [phone]

## 2014-08-14 NOTE — Patient Instructions (Signed)
[facility] Discharge Instructions for Patients Receiving Chemotherapy  Today you received the following chemotherapy agents:  Herceptin  If you develop nausea and vomiting, or diarrhea that is not controlled by your medication, call the clinic.  The clinic phone number is [phone]. Office hours are Monday-Friday 8:30am-5:00pm.  BELOW ARE SYMPTOMS THAT SHOULD BE REPORTED IMMEDIATELY:  *FEVER GREATER THAN 101.0 F  *CHILLS WITH OR WITHOUT FEVER  NAUSEA AND VOMITING THAT IS NOT CONTROLLED WITH YOUR NAUSEA MEDICATION  *UNUSUAL SHORTNESS OF BREATH  *UNUSUAL BRUISING OR BLEEDING  TENDERNESS IN MOUTH AND THROAT WITH OR WITHOUT PRESENCE OF ULCERS  *URINARY PROBLEMS  *BOWEL PROBLEMS  UNUSUAL RASH Items with * indicate a potential emergency and should be followed up as soon as possible. If you have an emergency after office hours please contact your primary care physician or go to the nearest emergency department.  Please call the clinic during office hours if you have any questions or concerns.   You may also contact the Patient Navigator at [phone] should you have any questions or need assistance in obtaining follow up care. _____________________________________________________________________ Have you asked about our STAR program?    STAR stands for Survivorship Training and Rehabilitation, and this is a nationally recognized cancer care program that focuses on survivorship and rehabilitation.  Cancer and cancer treatments may cause problems, such as, pain, making you feel tired and keeping you from doing the things that you need or want to do. Cancer rehabilitation can help. Our goal is to reduce these troubling effects and help you have the best quality of life possible.  You may receive a survey from a nurse that asks questions about your current state of health.  Based on the survey results, all eligible patients will be referred to the STAR program for  an evaluation so we can better serve you! A frequently asked questions sheet is available upon request.           

## 2014-08-14 NOTE — Patient Instructions (Signed)
[place] at [facility]  Discharge Instructions:  You saw Dr Whitney Muse today.   Follow up with the doctor in 3 weeks.   Chemotherapy appt as scheduled. Xgeva as scheduled.   Genetic counseling Monday at [facility], We are going to try to schedule your PET scan the same day. Please call the clinic if you have any questions or concerns  _______________________________________________________________  Thank you for choosing [place] at [facility] to provide your oncology and hematology care.  To afford each patient quality time with our providers, please arrive at least 15 minutes before your scheduled appointment.  You need to re-schedule your appointment if you arrive 10 or more minutes late.  We strive to give you quality time with our providers, and arriving late affects you and other patients whose appointments are after yours.  Also, if you no show three or more times for appointments you may be dismissed from the clinic.  Again, thank you for choosing [place] at [place] hope is that these requests will allow you access to exceptional care and in a timely manner. _______________________________________________________________  If you have questions after your visit, please contact our office at [phone] between the hours of 8:30 a.m. and 5:00 p.m. Voicemails left after 4:30 p.m. will not be returned until the following business day. _______________________________________________________________  For prescription refill requests, have your pharmacy contact our office. _______________________________________________________________  Recommendations made by the consultant and any test results will be sent to your referring physician. _______________________________________________________________

## 2014-08-14 NOTE — Progress Notes (Signed)
Allison Labrum, [name] [place] [address]  Stage IV adenocarcinoma with metastases to liver and bone  Had last mammogram in [place] this year, Dr. Gaetano Net  Colonoscopy 05/18/2013  Breast Reduction 03/17/2011  CURRENT THERAPY: Herceptin/Tamoxifen/XGEVA  INTERVAL HISTORY: [name] 49 y.o. female returns for follow-up of stage IV adenocarcinoma. Her primary site it felt to be breast. She is finally doing very well. She has gone to the beach with her family. Her only complaint today is some left shoulder pain that is worse with use. He denies any additional diarrhea, but does note it took over 4 weeks for her bowels to return to normal. She is here today for additional therapy and would like to proceed with tamoxifen and Herceptin only. Her tumor markers continue to decline. She states her primary goal is quality of life as we have discussed many times in the past.  She is due for a MUGA. She is requesting refills on Zofran.  MEDICAL HISTORY: Past Medical History  Diagnosis Date  . PONV (postoperative nausea and vomiting)     pt states scope patch does well  . Anemia   . Depression   . Anxiety   . Headache(784.0)     has migraines - medication controls  . Breast cancer 04/2014    Presumed dx; stage 4 w/ mets to bone and liver  . Bony metastasis     has Hypertrophy of breast; Chest pain, rule out acute myocardial infarction; Hyperlipidemia; Depression; Hypokalemia; Elevated LFTs; Liver lesion; Intractable abdominal pain; Breast cancer, stage 4; Metastatic adenocarcinoma; Bone metastases; Epigastric abdominal pain; Breast cancer; Fever; Pancytopenia; Neutropenic fever; Bony metastasis; Mucositis due to chemotherapy; Diarrhea; DVT (deep venous thrombosis); Dehydration; Orthostatic hypotension; and GERD (gastroesophageal reflux disease) on her problem list.      Breast cancer, stage 4   04/25/2014 Initial Diagnosis Breast cancer, stage 4   04/25/2014 Imaging CT  abdomen/pelvia with widespread metastatic disease to the liver, multiple lytic lesions throughout spine and pelvis. No FX or epidural tumor identified   04/26/2014 Imaging CT head unremarkable   04/26/2014 Imaging CT chest with no lung mass or pulmonary nodules, no adenopathy. Lytic bone lesions, right 2nd rib   04/27/2014 Initial Biopsy U/S guided liver biopsy, lesion in anterior and inferior left hepatic lobe biopsied   04/27/2014 Pathology Results Metastatic adenocarcinoma, CK7, ER+, patchy positivity with PR. Possible primary includes breast, less likely gynecologic   05/15/2014 Mammogram BI-RADS CATEGORY  2: Benign Finding(s)   05/16/2014 PET scan 1. Intensely hypermetabolic hepatic metastasis. 2. Widespread hypermetabolic skeletal lesions. 3. No primary adenocarcinoma identified by FDG PET imaging.   05/19/2014 Imaging MUGA- Left ventricular ejection fracture greater than 70%.   05/21/2014 Breast MRI No suspicious masses or enhancement within the breasts. No axillary adenopathy.   05/22/2014 -  Antibody Plan Herceptin/Perjeta/Tamoxifen   06/12/2014 -  Chemotherapy Taxotere added secondary to persistent abdominal and back pain   06/17/2014 - 06/19/2014 Hospital Admission Neutropenia, fever, diarrhea, nausea, vomiting   06/20/2014 - 07/10/2014 Radiation Therapy Dr. Thea Silversmith 12 fractions to L3-S3 (30 Gy) and left scapula (20 Gy).    07/16/2014 -  Hospital Admission Electrolyte abnormalities, and diarrhea.  Suspect Perjeta-induced diarrhea.  Negative GI work-up.     has No Known Allergies.  [name] does not currently have medications on file.  SURGICAL HISTORY: Past Surgical History  Procedure Laterality Date  . Right rotator cuff      2002  . Neck fusion  2003  . Laparoscopic cholecystectomy      2004  . Right knee arthroscopy      2005  . Colon surgery      2008  . Appendectomy      2008  . Abdominal hysterectomy    . Breast reduction surgery  03/17/2011    Procedure: MAMMARY  REDUCTION BILATERAL (BREAST);  Surgeon: Mary A Contogiannis;  Location: [place];  Service: Plastics;  Laterality: Bilateral;  . Colonoscopy N/A 07/13/2013    Procedure: COLONOSCOPY;  Surgeon: Rogene Houston, [name];  [facility];  Service: Endoscopy;  Laterality: N/A;  930  . Liver biopsy  04/2014  . Portacath placement    . Esophagogastroduodenoscopy N/A 05/25/2014    Procedure: ESOPHAGOGASTRODUODENOSCOPY (EGD);  Surgeon: Rogene Houston, [name];  [facility];  Service: Endoscopy;  Laterality: N/A;  155    SOCIAL HISTORY: History   Social History  . Marital Status: Married    Spouse Name: N/A  . Number of Children: N/A  . Years of Education: N/A   Occupational History  . Not on file.   Social History Main Topics  . Smoking status: Former Smoker    Types: Cigarettes  . Smokeless tobacco: Never Used  . Alcohol Use: Yes     Comment: Occasionally  . Drug Use: No  . Sexual Activity: Yes    Birth Control/ Protection: Surgical   Other Topics Concern  . Not on file   Social History Narrative    FAMILY HISTORY: Family History  Problem Relation Age of Onset  . Colon cancer Neg Hx   . Diabetes Father   . Heart attack Maternal Grandmother 30    multiple over lifetime.    Review of Systems  Constitutional: Negative for fever, chills, weight loss and malaise/fatigue.  HENT: Negative for congestion, hearing loss, nosebleeds, sore throat and tinnitus.   Eyes: Negative for blurred vision, double vision, pain and discharge.  Respiratory: Negative for cough, hemoptysis, sputum production, shortness of breath and wheezing.   Cardiovascular: Negative for chest pain, palpitations, claudication, leg swelling and PND.  Gastrointestinal: Negative for heartburn, nausea, vomiting, abdominal pain, diarrhea, constipation, blood in stool and melena.  Genitourinary: Negative for dysuria, urgency, frequency and hematuria.  Musculoskeletal: Negative for myalgias,  joint pain and falls.  Skin: Negative for itching and rash.  Neurological: Negative for dizziness, tingling, tremors, sensory change, speech change, focal weakness, seizures, loss of consciousness, weakness and headaches.  Endo/Heme/Allergies: Does not bruise/bleed easily.  Psychiatric/Behavioral: Negative for depression, suicidal ideas, memory loss and substance abuse. The patient is not nervous/anxious and does not have insomnia.     PHYSICAL EXAMINATION  ECOG PERFORMANCE STATUS: 1 - Symptomatic but completely ambulatory  There were no vitals filed for this visit.  Physical Exam  Constitutional: She is oriented to person, place, and time and well-developed, well-nourished, and in no distress.  alopecia  HENT:  Head: Normocephalic and atraumatic.  Nose: Nose normal.  Mouth/Throat: Oropharynx is clear and moist. No oropharyngeal exudate.  Eyes: Conjunctivae and EOM are normal. Pupils are equal, round, and reactive to light. Right eye exhibits no discharge. Left eye exhibits no discharge. No scleral icterus.  Neck: Normal range of motion. No tracheal deviation present. No thyromegaly present.  Cardiovascular: Normal rate, regular rhythm and normal heart sounds.  Exam reveals no gallop and no friction rub.   No murmur heard. Pulmonary/Chest: Effort normal and breath sounds normal. She has no wheezes. She has no  rales.  Abdominal: Soft. Bowel sounds are normal. She exhibits no distension and no mass. There is no tenderness. There is no rebound and no guarding.  Musculoskeletal: Normal range of motion. She exhibits no edema.  Lymphadenopathy:    She has no cervical adenopathy.  Neurological: She is alert and oriented to person, place, and time. She has normal reflexes. No cranial nerve deficit. Gait normal. Coordination normal.  Skin: Skin is warm and dry. No rash noted.  Psychiatric: Mood, memory, affect and judgment normal.  Nursing note and vitals reviewed.   LABORATORY  DATA:  CBC    Component Value Date/Time   WBC 4.6 07/24/2014 0854   RBC 3.88 07/24/2014 0854   HGB 11.8* 07/24/2014 0854   HCT 36.1 07/24/2014 0854   PLT 241 07/24/2014 0854   MCV 93.0 07/24/2014 0854   MCH 30.4 07/24/2014 0854   MCHC 32.7 07/24/2014 0854   RDW 16.5* 07/24/2014 0854   LYMPHSABS 0.5* 07/24/2014 0854   MONOABS 0.5 07/24/2014 0854   EOSABS 0.0 07/24/2014 0854   BASOSABS 0.0 07/24/2014 0854   CMP     Component Value Date/Time   NA 139 07/24/2014 0854   K 3.7 07/24/2014 0854   CL 105 07/24/2014 0854   CO2 26 07/24/2014 0854   GLUCOSE 162* 07/24/2014 0854   BUN 7 07/24/2014 0854   CREATININE 0.62 07/24/2014 0854   CALCIUM 8.6 07/24/2014 0854   PROT 5.7* 07/24/2014 0854   ALBUMIN 2.7* 07/24/2014 0854   AST 17 07/24/2014 0854   ALT 16 07/24/2014 0854   ALKPHOS 77 07/24/2014 0854   BILITOT 0.2* 07/24/2014 0854   GFRNONAA >90 07/24/2014 0854   GFRAA >90 07/24/2014 0854      ASSESSMENT and THERAPY PLAN:   Stage IV ER positive, HER-2 positive carcinoma of the breast  [name] is currently doing well. She has been through multiple complications from therapy including a grade 3 diarrhea from perjeta. She has had palliative radiation to the left shoulder/scapula and spine. Her pain is finally well-controlled.  After a long discussion today we have opted to proceed forward with only Herceptin and tamoxifen. She is due for a another MUGA and we will set this up today. We will also repeat PET imaging for reassessment of her disease. We will refill requested medications. She is going for genetic counseling this upcoming Monday.  All questions were answered. The patient knows to call the clinic with any problems, questions or concerns. We can certainly see the patient much sooner if necessary. This note was signed electronically  Molli Hazard 08/14/2014

## 2014-08-15 LAB — CANCER ANTIGEN 27.29: CA 27.29: 46.9 U/mL — AB (ref 0.0–38.6)

## 2014-08-16 ENCOUNTER — Ambulatory Visit (HOSPITAL_COMMUNITY): Payer: 59

## 2014-08-20 ENCOUNTER — Ambulatory Visit (HOSPITAL_BASED_OUTPATIENT_CLINIC_OR_DEPARTMENT_OTHER): Payer: 59 | Admitting: Genetic Counselor

## 2014-08-20 ENCOUNTER — Encounter: Payer: Self-pay | Admitting: Genetic Counselor

## 2014-08-20 ENCOUNTER — Other Ambulatory Visit: Payer: 59

## 2014-08-20 DIAGNOSIS — C787 Secondary malignant neoplasm of liver and intrahepatic bile duct: Secondary | ICD-10-CM

## 2014-08-20 DIAGNOSIS — C50812 Malignant neoplasm of overlapping sites of left female breast: Secondary | ICD-10-CM | POA: Diagnosis not present

## 2014-08-20 DIAGNOSIS — C7951 Secondary malignant neoplasm of bone: Secondary | ICD-10-CM

## 2014-08-20 DIAGNOSIS — Z315 Encounter for genetic counseling: Secondary | ICD-10-CM

## 2014-08-20 DIAGNOSIS — C50919 Malignant neoplasm of unspecified site of unspecified female breast: Secondary | ICD-10-CM

## 2014-08-20 DIAGNOSIS — Z8042 Family history of malignant neoplasm of prostate: Secondary | ICD-10-CM | POA: Insufficient documentation

## 2014-08-20 DIAGNOSIS — Z801 Family history of malignant neoplasm of trachea, bronchus and lung: Secondary | ICD-10-CM

## 2014-08-20 NOTE — Progress Notes (Signed)
REFERRING PROVIDER: [name], MD [place], [place] [address]   Ancil Linsey, MD  PRIMARY PROVIDER:  [name], MD  PRIMARY REASON FOR VISIT:  1. Breast cancer, stage 4, unspecified laterality   2. Family history of prostate cancer      HISTORY OF PRESENT ILLNESS:   [name], a 49 y.o. female, was seen for a  cancer genetics consultation at the request of Dr. Whitney Muse due to a personal and family history of cancer.  [name] presents to clinic today to discuss the possibility of a hereditary predisposition to cancer, genetic testing, and to further clarify her future cancer risks, as well as potential cancer risks for family members.   In December 2015, at the age of 69, [name] was diagnosed with invasive metastatic breast cancer.  She was worked up for a possible heart attack and on a scan was found to have liver lesions.  These were biopsied and found to be breast cancer.  Mammogram, MRI, CT and PET scans have not found the tumor source.  She had a breast reduction approximately 2 years ago, and she thinks that she may have had cancer at that time and it was not found in the pathology of the tissue.   CANCER HISTORY:    Breast cancer, stage 4   04/25/2014 Initial Diagnosis Breast cancer, stage 4   04/25/2014 Imaging CT abdomen/pelvia with widespread metastatic disease to the liver, multiple lytic lesions throughout spine and pelvis. No FX or epidural tumor identified   04/26/2014 Imaging CT head unremarkable   04/26/2014 Imaging CT chest with no lung mass or pulmonary nodules, no adenopathy. Lytic bone lesions, right 2nd rib   04/27/2014 Initial Biopsy U/S guided liver biopsy, lesion in anterior and inferior left hepatic lobe biopsied   04/27/2014 Pathology Results Metastatic adenocarcinoma, CK7, ER+, patchy positivity with PR. Possible primary includes breast, less likely gynecologic   05/15/2014 Mammogram BI-RADS CATEGORY  2: Benign Finding(s)   05/16/2014 PET scan 1. Intensely hypermetabolic hepatic metastasis. 2. Widespread hypermetabolic skeletal lesions. 3. No primary adenocarcinoma identified by FDG PET imaging.   05/19/2014 Imaging MUGA- Left ventricular ejection fracture greater than 70%.   05/21/2014 Breast MRI No suspicious masses or enhancement within the breasts. No axillary adenopathy.   05/22/2014 -  Antibody Plan Herceptin/Perjeta/Tamoxifen   06/12/2014 -  Chemotherapy Taxotere added secondary to persistent abdominal and back pain   06/17/2014 - 06/19/2014 Hospital Admission Neutropenia, fever, diarrhea, nausea, vomiting   06/20/2014 - 07/10/2014 Radiation Therapy Dr. Thea Silversmith 12 fractions to L3-S3 (30 Gy) and left scapula (20 Gy).    07/16/2014 -  Hospital Admission Electrolyte abnormalities, and diarrhea.  Suspect Perjeta-induced diarrhea.  Negative GI work-up.     HORMONAL RISK FACTORS:  Menarche was at age 15.  First live birth at age 51.  OCP use for approximately 25 years.  Ovaries intact: yes.  Hysterectomy: yes.  Menopausal status: postmenopausal.  HRT use: 0 years. Colonoscopy: yes; 1 polyps. Mammogram within the last year: yes. Number of breast biopsies: 0. Up to date with pelvic exams:  yes. Any excessive radiation exposure in the past:  no  Past Medical History  Diagnosis Date  . PONV (postoperative nausea and vomiting)     pt states scope patch does well  . Anemia   . Depression   . Anxiety   . Headache(784.0)     has migraines - medication controls  . Breast cancer 04/2014    Presumed  dx; stage 4 w/ mets to bone and liver  . Bony metastasis   . Family history of prostate cancer     Past Surgical History  Procedure Laterality Date  . Right rotator cuff      2002  . Neck fusion      2003  . Laparoscopic cholecystectomy      2004  . Right knee arthroscopy      2005  . Colon surgery      2008  . Appendectomy      2008  . Abdominal hysterectomy    . Breast reduction surgery  03/17/2011     Procedure: MAMMARY REDUCTION BILATERAL (BREAST);  Surgeon: Mary A Contogiannis;  Location: [place];  Service: Plastics;  Laterality: Bilateral;  . Colonoscopy N/A 07/13/2013    Procedure: COLONOSCOPY;  Surgeon: Rogene Houston, MD;  [facility];  Service: Endoscopy;  Laterality: N/A;  930  . Liver biopsy  04/2014  . Portacath placement    . Esophagogastroduodenoscopy N/A 05/25/2014    Procedure: ESOPHAGOGASTRODUODENOSCOPY (EGD);  Surgeon: Rogene Houston, MD;  [facility];  Service: Endoscopy;  Laterality: N/A;  155    History   Social History  . Marital Status: Married    Spouse Name: N/A  . Number of Children: N/A  . Years of Education: N/A   Social History Main Topics  . Smoking status: Former Smoker    Types: Cigarettes    Quit date: 08/20/1994  . Smokeless tobacco: Never Used  . Alcohol Use: Yes     Comment: Occasionally  . Drug Use: No  . Sexual Activity: Yes    Birth Control/ Protection: Surgical   Other Topics Concern  . None   Social History Narrative     FAMILY HISTORY:  We obtained a detailed, 4-generation family history.  Significant diagnoses are listed below: Family History  Problem Relation Age of Onset  . Diabetes Father   . Heart attack Maternal Grandmother 30    multiple over lifetime.  . Cancer Maternal Grandmother 36    NOS  . Prostate cancer Maternal Grandfather     dx in his 77s  . Lung cancer Paternal Grandfather     dx <50  . Lymphoma Maternal Aunt     dx in her 13s  . Melanoma Cousin 60    maternal first cousin  . Brain cancer Cousin     paternal first cousin dx under 28  . Prostate cancer Other     MGF's father  . Colon cancer Other     MGM's mother   The patient has a son, daughter and sister, none of whom have cancer.  Her parents are alive and well.  Her mother had a TAH-BSO <50 for unknown reasons.  Her mother had one brother and two sisters, one sister had lymphoma in her 3s. That sister  had a son with melanoma dx at 76.  [name]'s MGF had prostate cancer and died from this at 69.  His father also had prostate cancer.  Her grandmother was told that she had cancer before she died but it had spread too extensively and they did not try to determine the primary.  Her grandmother's mother had colon cancer.  Ms. Baston's father had three sisters, none of whom had cancer.  He had one niece who had a brain tumor and died under hte age of 23. Patient's maternal ancestors are of [place] and [place] descent, and paternal  ancestors are of [place] and Spanish descent. There is no reported Ashkenazi Jewish ancestry. There is no known consanguinity.  GENETIC COUNSELING ASSESSMENT: Loyce H Rugg is a 49 y.o. female with a personal history of breast cancer which somewhat suggestive of a hereditary cancer syndrome and predisposition to cancer. We, therefore, discussed and recommended the following at today's visit.   DISCUSSION: We reviewed the characteristics, features and inheritance patterns of hereditary cancer syndromes. We also discussed genetic testing, including the appropriate family members to test, the process of testing, insurance coverage and turn-around-time for results. We discussed the implications of a negative, positive and/or variant of uncertain significant result. We recommended Ms. Sahni pursue genetic testing for the Breast high/moderate risk gene panel. The Breast High/Moderate Risk gene panel offered by [facility] includes sequencing and deletion/duplication analysis of the following 9 genes: ATM, BRCA1, BRCA2, CDH1, CHEK2, PALB2, PTEN, STK11, and TP53.   PLAN: After considering the risks, benefits, and limitations, Ms. Kana  provided informed consent to pursue genetic testing and the blood sample was sent to [facility] for analysis of the Breast High/Moderate Risk gene panel. Results should be available within approximately 2-3 weeks' time, at which point they will be  disclosed by telephone to Ms. Ziomek, as will any additional recommendations warranted by these results. Ms. Pearce will receive a summary of her genetic counseling visit and a copy of her results once available. This information will also be available in Epic. We encouraged Ms. Yohe to remain in contact with cancer genetics annually so that we can continuously update the family history and inform her of any changes in cancer genetics and testing that may be of benefit for her family. Ms. Dinning's questions were answered to her satisfaction today. Our contact information was provided should additional questions or concerns arise.  Lastly, we encouraged Ms. Mooers to remain in contact with cancer genetics annually so that we can continuously update the family history and inform her of any changes in cancer genetics and testing that may be of benefit for this family.   Ms.  Burford's questions were answered to her satisfaction today. Our contact information was provided should additional questions or concerns arise. Thank you for the referral and allowing [place] to share in the care of your patient.   Karen P. Florene Glen, [place], [facility] Certified Genetic Counselor [email or web address] phone: [phone]  The patient was seen for a total of 60 minutes in face-to-face genetic counseling.  This patient was discussed with Drs. Magrinat, Lindi Adie and/or Burr Medico who agrees with the above.    _______________________________________________________________________ For Office Staff:  Number of people involved in session: 2 Was an Intern/ student involved with case: no

## 2014-08-21 ENCOUNTER — Encounter (HOSPITAL_COMMUNITY)
Admission: RE | Admit: 2014-08-21 | Discharge: 2014-08-21 | Disposition: A | Discharge status: Home / Self Care | Payer: 59 | Source: Ambulatory Visit | Attending: Hematology & Oncology | Admitting: Hematology & Oncology

## 2014-08-21 ENCOUNTER — Encounter (HOSPITAL_COMMUNITY): Payer: Self-pay

## 2014-08-21 DIAGNOSIS — C50919 Malignant neoplasm of unspecified site of unspecified female breast: Secondary | ICD-10-CM | POA: Diagnosis not present

## 2014-08-21 MED ORDER — TECHNETIUM TC 99M-LABELED RED BLOOD CELLS IV KIT
25.0000 | PACK | Freq: Once | INTRAVENOUS | Status: AC | PRN
  Administered 2014-08-21: 25 via INTRAVENOUS

## 2014-08-21 MED ORDER — HEPARIN SOD (PORK) LOCK FLUSH 100 UNIT/ML IV SOLN
INTRAVENOUS | Status: AC
  Filled 2014-08-21: qty 5

## 2014-08-23 ENCOUNTER — Encounter (HOSPITAL_COMMUNITY): Payer: 59 | Attending: Hematology & Oncology

## 2014-08-23 ENCOUNTER — Encounter (HOSPITAL_COMMUNITY): Payer: Self-pay

## 2014-08-23 VITALS — BP 127/67 | HR 80 | Temp 98.2°F | Resp 18

## 2014-08-23 DIAGNOSIS — C50919 Malignant neoplasm of unspecified site of unspecified female breast: Secondary | ICD-10-CM | POA: Diagnosis not present

## 2014-08-23 DIAGNOSIS — C7951 Secondary malignant neoplasm of bone: Secondary | ICD-10-CM

## 2014-08-23 MED ORDER — DENOSUMAB 120 MG/1.7ML ~~LOC~~ SOLN
120.0000 mg | Freq: Once | SUBCUTANEOUS | Status: AC
Start: 2014-08-23 — End: 2014-08-23
  Administered 2014-08-23: 120 mg via SUBCUTANEOUS
  Filled 2014-08-23: qty 1.7

## 2014-08-23 NOTE — Patient Instructions (Signed)
[place] at [facility] Discharge Instructions  RECOMMENDATIONS MADE BY THE CONSULTANT AND ANY TEST RESULTS WILL BE SENT TO YOUR REFERRING PHYSICIAN.  Xgeva injection today. Return as scheduled for Herceptin infusions, office visits, and injections.   Thank you for choosing [place] at [facility] to provide your oncology and hematology care.  To afford each patient quality time with our provider, please arrive at least 15 minutes before your scheduled appointment time.    You need to re-schedule your appointment should you arrive 10 or more minutes late.  We strive to give you quality time with our providers, and arriving late affects you and other patients whose appointments are after yours.  Also, if you no show three or more times for appointments you may be dismissed from the clinic at the providers discretion.     Again, thank you for choosing [facility].  Our hope is that these requests will decrease the amount of time that you wait before being seen by our physicians.       _____________________________________________________________  Should you have questions after your visit to [facility], please contact our office at [phone] between the hours of 8:30 a.m. and 4:30 p.m.  Voicemails left after 4:30 p.m. will not be returned until the following business day.  For prescription refill requests, have your pharmacy contact our office.

## 2014-08-23 NOTE — Progress Notes (Signed)
1210:  [name] presents today for injection per the provider's orders.  Xgeva administration without incident; see MAR for injection details.  Patient tolerated procedure well and without incident.  No questions or complaints noted at this time.

## 2014-08-24 ENCOUNTER — Ambulatory Visit (HOSPITAL_COMMUNITY)
Admission: RE | Admit: 2014-08-24 | Discharge: 2014-08-24 | Disposition: A | Discharge status: Home / Self Care | Payer: 59 | Source: Ambulatory Visit | Attending: Hematology & Oncology | Admitting: Hematology & Oncology

## 2014-08-24 DIAGNOSIS — C50919 Malignant neoplasm of unspecified site of unspecified female breast: Secondary | ICD-10-CM | POA: Insufficient documentation

## 2014-08-24 LAB — GLUCOSE, CAPILLARY: Glucose-Capillary: 108 mg/dL — ABNORMAL HIGH (ref 70–99)

## 2014-08-24 MED ORDER — FLUDEOXYGLUCOSE F - 18 (FDG) INJECTION
11.7400 | Freq: Once | INTRAVENOUS | Status: AC | PRN
  Administered 2014-08-24: 11.74 via INTRAVENOUS

## 2014-09-01 NOTE — Progress Notes (Signed)
[name], MD [place] [address]  Breast cancer, stage 4, unspecified laterality  CURRENT THERAPY: Herceptin/Tamoxifen/XGEVA  INTERVAL HISTORY: [name] 49 y.o. female returns for followup of Stage IV adenocarcinoma with metastases to liver and bone.    Breast cancer, stage 4   04/25/2014 Initial Diagnosis Breast cancer, stage 4   04/25/2014 Imaging CT abdomen/pelvia with widespread metastatic disease to the liver, multiple lytic lesions throughout spine and pelvis. No FX or epidural tumor identified   04/26/2014 Imaging CT head unremarkable   04/26/2014 Imaging CT chest with no lung mass or pulmonary nodules, no adenopathy. Lytic bone lesions, right 2nd rib   04/27/2014 Initial Biopsy U/S guided liver biopsy, lesion in anterior and inferior left hepatic lobe biopsied   04/27/2014 Pathology Results Metastatic adenocarcinoma, CK7, ER+, patchy positivity with PR. Possible primary includes breast, less likely gynecologic   05/15/2014 Mammogram BI-RADS CATEGORY  2: Benign Finding(s)   05/16/2014 PET scan 1. Intensely hypermetabolic hepatic metastasis. 2. Widespread hypermetabolic skeletal lesions. 3. No primary adenocarcinoma identified by FDG PET imaging.   05/19/2014 Imaging MUGA- Left ventricular ejection fracture greater than 70%.   05/21/2014 Breast MRI No suspicious masses or enhancement within the breasts. No axillary adenopathy.   05/22/2014 - 07/03/2014 Antibody Plan Herceptin/Perjeta/Tamoxifen   06/12/2014 - 07/03/2014 Chemotherapy Taxotere added secondary to persistent abdominal and back pain   06/17/2014 - 06/19/2014 Hospital Admission Neutropenia, fever, diarrhea, nausea, vomiting   06/20/2014 - 07/10/2014 Radiation Therapy Dr. Thea Silversmith 12 fractions to L3-S3 (30 Gy) and left scapula (20 Gy).    07/03/2014 Adverse Reaction Perjeta- induced diarrhea.  Perjeta discontinued   07/16/2014 - 07/20/2014 Hospital Admission Electrolyte abnormalities, and diarrhea.   Suspect Perjeta-induced diarrhea.  Negative GI work-up.   07/24/2014 -  Chemotherapy Herceptin/Tamoxifen/Xgeva   08/21/2014 Imaging MUGA- Left ventricular ejection fraction equals 71%.   08/24/2014 PET scan Dramatic reduction in metabolic activity of the widespread liver metastasis. Liver metastasis now have metabolic activity equal to background normal liver activity. Liver has a nodular contour. Marked reduction in metabolic activity of skeletal lesions..     I personally reviewed and went over laboratory results with the patient.  The results are noted within this dictation.  I personally reviewed and went over radiographic studies with the patient.  The results are noted within this dictation.    Case discussed with [name], PA-C [facility]) regarding scapular fracture.  As expected, no surgical intervention is recommended.   She has a few questions: 1. Cataract surgery-  She is free to have this done when able. 2. Massage- she is free to have massages but she is informed to let the therapist know about her cancer.  She is to continue to avoid chiropractor. 3. Pedicure- she is not on cytotoxic or immunosuppressive medications, she is able to have a pedicure if she wishes.  4. Tattoo work- for now I have asked her to wait on that.  In the future, I do not see a reason why it cannot be done. 5. Hot flashes- this is Tamoxifen induced.  She is currently on Zoloft.  I have offered her a change in anti-depressant or the addition of a low dose antidepressant, but she declines at this time.  She will let us know if the hot flashes interfere with QOL. 6. Shoulder pain- she is to continue with pain control.  She may add an Aleve occasionally as an anti-inflammatory may be beneficial.  Otherwise, oncologically, she denies any complaints and ROS questioning is negative.   Past Medical History  Diagnosis Date  . PONV (postoperative nausea and vomiting)     pt states scope patch does well  .  Anemia   . Depression   . Anxiety   . Headache(784.0)     has migraines - medication controls  . Breast cancer 04/2014    Presumed dx; stage 4 w/ mets to bone and liver  . Bony metastasis   . Family history of prostate cancer     has Hypertrophy of breast; Chest pain, rule out acute myocardial infarction; Hyperlipidemia; Depression; Hypokalemia; Elevated LFTs; Liver lesion; Intractable abdominal pain; Breast cancer, stage 4; Metastatic adenocarcinoma; Bone metastases; Epigastric abdominal pain; Breast cancer; Fever; Pancytopenia; Neutropenic fever; Bony metastasis; Mucositis due to chemotherapy; Diarrhea; DVT (deep venous thrombosis); Dehydration; Orthostatic hypotension; GERD (gastroesophageal reflux disease); and Family history of prostate cancer on her problem list.     has No Known Allergies.  Ms. Flax had no medications administered during this visit.  Past Surgical History  Procedure Laterality Date  . Right rotator cuff      2002  . Neck fusion      2003  . Laparoscopic cholecystectomy      2004  . Right knee arthroscopy      2005  . Colon surgery      2008  . Appendectomy      2008  . Abdominal hysterectomy    . Breast reduction surgery  03/17/2011    Procedure: MAMMARY REDUCTION BILATERAL (BREAST);  Surgeon: Mary A Contogiannis;  Location: [place];  Service: Plastics;  Laterality: Bilateral;  . Colonoscopy N/A 07/13/2013    Procedure: COLONOSCOPY;  Surgeon: Rogene Houston, MD;  [facility];  Service: Endoscopy;  Laterality: N/A;  930  . Liver biopsy  04/2014  . Portacath placement    . Esophagogastroduodenoscopy N/A 05/25/2014    Procedure: ESOPHAGOGASTRODUODENOSCOPY (EGD);  Surgeon: Rogene Houston, MD;  [facility];  Service: Endoscopy;  Laterality: N/A;  155    Denies any headaches, dizziness, double vision, fevers, chills, night sweats, nausea, vomiting, diarrhea, constipation, chest pain, heart palpitations, shortness of  breath, blood in stool, black tarry stool, urinary pain, urinary burning, urinary frequency, hematuria.   PHYSICAL EXAMINATION  ECOG PERFORMANCE STATUS: 0 - Asymptomatic  There were no vitals filed for this visit.  GENERAL:alert, no distress, well nourished, well developed, comfortable, cooperative, obese, smiling and hair growing back SKIN: skin color, texture, turgor are normal, no rashes or significant lesions HEAD: Normocephalic, No masses, lesions, tenderness or abnormalities EYES: normal, PERRLA, EOMI, Conjunctiva are pink and non-injected EARS: External ears normal OROPHARYNX:lips, buccal mucosa, and tongue normal and mucous membranes are moist  NECK: supple, no adenopathy, thyroid normal size, non-tender, without nodularity, no stridor, non-tender, trachea midline LYMPH:  no palpable lymphadenopathy BREAST:not examined LUNGS: clear to auscultation  HEART: regular rate & rhythm, no murmurs, no gallops, S1 normal and S2 normal ABDOMEN:abdomen soft, non-tender, normal bowel sounds and no masses or organomegaly BACK: Back symmetric, no curvature., No CVA tenderness EXTREMITIES:less then 2 second capillary refill, no joint deformities, effusion, or inflammation, no edema, no skin discoloration, no clubbing, no cyanosis  NEURO: alert & oriented x 3 with fluent speech, no focal motor/sensory deficits, gait normal    LABORATORY DATA: CBC    Component Value Date/Time   WBC 5.8 09/04/2014 0958   RBC 3.90 09/04/2014 0958   HGB  12.0 09/04/2014 0958   HCT 37.2 09/04/2014 0958   PLT 182 09/04/2014 0958   MCV 95.4 09/04/2014 0958   MCH 30.8 09/04/2014 0958   MCHC 32.3 09/04/2014 0958   RDW 15.5 09/04/2014 0958   LYMPHSABS 0.9 09/04/2014 0958   MONOABS 0.7 09/04/2014 0958   EOSABS 0.1 09/04/2014 0958   BASOSABS 0.0 09/04/2014 0958      Chemistry      Component Value Date/Time   NA 139 08/14/2014 0844   K 4.0 08/14/2014 0844   CL 104 08/14/2014 0844   CO2 26 08/14/2014 0844     BUN 11 08/14/2014 0844   CREATININE 0.68 08/14/2014 0844      Component Value Date/Time   CALCIUM 9.3 08/14/2014 0844   ALKPHOS 63 08/14/2014 0844   AST 21 08/14/2014 0844   ALT 19 08/14/2014 0844   BILITOT 0.2* 08/14/2014 0844     Lab Results  Component Value Date   LABCA2 46.9* 08/14/2014     RADIOGRAPHIC STUDIES:  Nm Cardiac Muga Rest  08/21/2014   CLINICAL DATA:  Breast cancer. Evaluate cardiac function in relation to chemotherapy.  EXAM: NUCLEAR MEDICINE CARDIAC BLOOD POOL IMAGING (MUGA)  TECHNIQUE: Cardiac multi-gated acquisition was performed at rest following intravenous injection of Tc-65m labeled red blood cells.  RADIOPHARMACEUTICALS:  25.0 mCiTc-59m in-vitro labeled red blood cells.  COMPARISON:  MUGA scan can 05/18/2014  FINDINGS: No  focal wall motion abnormality of the left ventricle.  Calculated left ventricular ejection fraction equals 71% which compares to greater than 70% on prior.  IMPRESSION: Left ventricular ejection fraction equals 7 1%.   Electronically Signed   By: Suzy Bouchard M.D.   On: 08/21/2014 16:44   Nm Pet Image Restag (ps) Skull Base To Thigh  08/24/2014   CLINICAL DATA:  Subsequent treatment strategy for stage IV breast cancer.  EXAM: NUCLEAR MEDICINE PET SKULL BASE TO THIGH  TECHNIQUE: 11.8 mCi F-18 FDG was injected intravenously. Full-ring PET imaging was performed from the skull base to thigh after the radiotracer. CT data was obtained and used for attenuation correction and anatomic localization.  FASTING BLOOD GLUCOSE:  Value: 108 mg/dl  COMPARISON:  PET-CT scan 05/16/2014  FINDINGS: NECK  No hypermetabolic lymph nodes in the neck.  CHEST  No hypermetabolic mediastinal or hilar nodes. No suspicious pulmonary nodules on the CT scan.  ABDOMEN/PELVIS  There is marked reduction in metastatic metabolic activity within the liver. The liver has now returned to normal metabolic activity (SUV max 3.7) compared to SUV max 22 on comparison exam. There are  multiple small hypodense lesions throughout the liver which are not significantly hypermetabolic. The liver has a nodular contour.  There are no hypermetabolic abdominal or pelvic lymph nodes. No abnormality of the adnexa.  SKELETON  There is marked improvement in hypermetabolic skeletal metastasis compared to prior. For example lesion in the right left sacrum with SUV max 4.1 decreased from SUV max 17.6. Lesion in the left iliac wing is no longer hypermetabolic. Lesion at L 2 is decreased in metabolic activity with SUV max equal 3.3 compared to 17.5. There is a fracture through superior aspect of the left scapula spine. This is new from comparison PET scan of 05/16/2014.  IMPRESSION: 1. Dramatic reduction in metabolic activity of the widespread liver metastasis. Liver metastasis now have metabolic activity equal to background normal liver activity. Liver has a nodular contour. 2. Marked reduction in metabolic activity of skeletal metastasis. Some skeletal lesions are no longer metabolic. There  remain scattered metabolic foci within the ribs, pelvis, and shoulders. 3. There is new fracture through superior aspect the left scapula.   Electronically Signed   By: Suzy Bouchard M.D.   On: 08/24/2014 16:31     ASSESSMENT AND PLAN:  Breast cancer, stage 4 Stage IV adenocarcinoma of breast without discovery of primary tumor with metastases to liver and bone.  On Herceptin/Tamoxifen/XGEVA.  Oncology history updated.  Antibody plan updated and future cycles built.  Review of PET scan.  She is given a copy of report.  Addressed pain control for scapular fracture.  She may add Aleve occasionally, up to BID to help with pain control.  No role for ortho at this time.  Continue treatment as planned.   Tamoxifen compliance encouraged.  Return in 3 weeks for follow-up and Herceptin treatment.     THERAPY PLAN:  Excellent response to therapy thus far.  Continue treatment as planned and will monitor for  progression of disease.  All questions were answered. The patient knows to call the clinic with any problems, questions or concerns. We can certainly see the patient much sooner if necessary.  Patient and plan discussed with Dr. Ancil Linsey and she is in agreement with the aforementioned.   This note is electronically signed by: Robynn Pane 09/04/2014 10:25 AM

## 2014-09-01 NOTE — Assessment & Plan Note (Addendum)
Stage IV adenocarcinoma of breast without discovery of primary tumor with metastases to liver and bone.  On Herceptin/Tamoxifen/XGEVA.  Oncology history updated.  Antibody plan updated and future cycles built.  Review of PET scan.  She is given a copy of report.  Addressed pain control for scapular fracture.  She may add Aleve occasionally, up to BID to help with pain control.  No role for ortho at this time.  Continue treatment as planned.   Tamoxifen compliance encouraged.  Return in 3 weeks for follow-up and Herceptin treatment.

## 2014-09-04 ENCOUNTER — Encounter: Payer: Self-pay | Admitting: *Deleted

## 2014-09-04 ENCOUNTER — Ambulatory Visit (HOSPITAL_COMMUNITY): Payer: 59 | Admitting: Hematology & Oncology

## 2014-09-04 ENCOUNTER — Encounter (HOSPITAL_BASED_OUTPATIENT_CLINIC_OR_DEPARTMENT_OTHER): Payer: 59 | Admitting: Oncology

## 2014-09-04 ENCOUNTER — Encounter (HOSPITAL_COMMUNITY): Payer: Self-pay

## 2014-09-04 ENCOUNTER — Ambulatory Visit (HOSPITAL_COMMUNITY): Payer: 59

## 2014-09-04 ENCOUNTER — Encounter (HOSPITAL_BASED_OUTPATIENT_CLINIC_OR_DEPARTMENT_OTHER): Payer: 59

## 2014-09-04 VITALS — BP 103/55 | HR 96 | Temp 98.0°F | Resp 20 | Wt 238.0 lb

## 2014-09-04 DIAGNOSIS — C50812 Malignant neoplasm of overlapping sites of left female breast: Secondary | ICD-10-CM | POA: Diagnosis not present

## 2014-09-04 DIAGNOSIS — C50919 Malignant neoplasm of unspecified site of unspecified female breast: Secondary | ICD-10-CM

## 2014-09-04 DIAGNOSIS — C7951 Secondary malignant neoplasm of bone: Secondary | ICD-10-CM

## 2014-09-04 DIAGNOSIS — C787 Secondary malignant neoplasm of liver and intrahepatic bile duct: Secondary | ICD-10-CM | POA: Diagnosis not present

## 2014-09-04 DIAGNOSIS — Z5112 Encounter for antineoplastic immunotherapy: Secondary | ICD-10-CM

## 2014-09-04 DIAGNOSIS — C229 Malignant neoplasm of liver, not specified as primary or secondary: Secondary | ICD-10-CM | POA: Diagnosis not present

## 2014-09-04 LAB — CBC WITH DIFFERENTIAL/PLATELET
Basophils Absolute: 0 10*3/uL (ref 0.0–0.1)
Basophils Relative: 0 % (ref 0–1)
EOS PCT: 2 % (ref 0–5)
Eosinophils Absolute: 0.1 10*3/uL (ref 0.0–0.7)
HCT: 37.2 % (ref 36.0–46.0)
Hemoglobin: 12 g/dL (ref 12.0–15.0)
LYMPHS ABS: 0.9 10*3/uL (ref 0.7–4.0)
LYMPHS PCT: 16 % (ref 12–46)
MCH: 30.8 pg (ref 26.0–34.0)
MCHC: 32.3 g/dL (ref 30.0–36.0)
MCV: 95.4 fL (ref 78.0–100.0)
MONOS PCT: 12 % (ref 3–12)
Monocytes Absolute: 0.7 10*3/uL (ref 0.1–1.0)
NEUTROS ABS: 4.1 10*3/uL (ref 1.7–7.7)
Neutrophils Relative %: 70 % (ref 43–77)
Platelets: 182 10*3/uL (ref 150–400)
RBC: 3.9 MIL/uL (ref 3.87–5.11)
RDW: 15.5 % (ref 11.5–15.5)
WBC: 5.8 10*3/uL (ref 4.0–10.5)

## 2014-09-04 LAB — COMPREHENSIVE METABOLIC PANEL
ALBUMIN: 3.5 g/dL (ref 3.5–5.2)
ALK PHOS: 45 U/L (ref 39–117)
ALT: 20 U/L (ref 0–35)
AST: 19 U/L (ref 0–37)
Anion gap: 7 (ref 5–15)
BILIRUBIN TOTAL: 0.2 mg/dL — AB (ref 0.3–1.2)
BUN: 15 mg/dL (ref 6–23)
CO2: 27 mmol/L (ref 19–32)
CREATININE: 0.68 mg/dL (ref 0.50–1.10)
Calcium: 8.8 mg/dL (ref 8.4–10.5)
Chloride: 103 mmol/L (ref 96–112)
GFR calc Af Amer: >90 mL/min (ref >90)
GFR calc non Af Amer: >90 mL/min (ref >90)
Glucose, Bld: 93 mg/dL (ref 70–99)
Potassium: 4 mmol/L (ref 3.5–5.1)
SODIUM: 137 mmol/L (ref 135–145)
Total Protein: 5.9 g/dL — ABNORMAL LOW (ref 6.0–8.3)

## 2014-09-04 MED ORDER — HEPARIN SOD (PORK) LOCK FLUSH 100 UNIT/ML IV SOLN
500.0000 [IU] | Freq: Once | INTRAVENOUS | Status: AC | PRN
  Administered 2014-09-04: 500 [IU]
  Filled 2014-09-04: qty 5

## 2014-09-04 MED ORDER — SODIUM CHLORIDE 0.9 % IV SOLN
Freq: Once | INTRAVENOUS | Status: AC
  Administered 2014-09-04: 10:00:00 via INTRAVENOUS

## 2014-09-04 MED ORDER — SODIUM CHLORIDE 0.9 % IJ SOLN: 10.0000 mL | INTRAMUSCULAR | Status: DC | PRN

## 2014-09-04 MED ORDER — ACETAMINOPHEN 325 MG PO TABS
650.0000 mg | ORAL_TABLET | Freq: Once | ORAL | Status: AC
  Administered 2014-09-04: 650 mg via ORAL
  Filled 2014-09-04: qty 2

## 2014-09-04 MED ORDER — TRASTUZUMAB CHEMO INJECTION 440 MG
6.0000 mg/kg | Freq: Once | INTRAVENOUS | Status: AC
  Administered 2014-09-04: 672 mg via INTRAVENOUS
  Filled 2014-09-04: qty 32

## 2014-09-04 MED ORDER — DIPHENHYDRAMINE HCL 25 MG PO CAPS
50.0000 mg | ORAL_CAPSULE | Freq: Once | ORAL | Status: AC
  Administered 2014-09-04: 50 mg via ORAL
  Filled 2014-09-04: qty 2

## 2014-09-04 NOTE — Progress Notes (Signed)
[name] Tolerated chemotherapy well today. Discharged ambulatory

## 2014-09-04 NOTE — Progress Notes (Signed)
[place] Clinical Social Work  Clinical Social Work was referred by [place] rounding for assessment of psychosocial needs due to CSW following.  Clinical Social Worker met with patient at [facility] to offer support and assess for needs.  Pt in good spirits today. She is very excited about being accepted into [employer] and goes May 8-12 to [place] with her husband. Pt is looking forward to talking with more breast cancer survivors on this retreat. This event should assist with adjustment to illness and finding hope. Pt denied new concerns or issues at this time. CSW will keep checking in with pt at future appointments.    Clinical Social Work interventions: Supportive listening Reassessment of needs  [name], [place] Tuesdays 8:30-1pm Wednesdays 8:30-12pm  [phone]

## 2014-09-04 NOTE — Patient Instructions (Signed)
.  [facility] Discharge Instructions for Patients Receiving Chemotherapy  Today you received the following chemotherapy agents herceptin Please follow up as scheduled Call the clinic if you have any questions or concerns  To help prevent nausea and vomiting after your treatment, we encourage you to take your nausea medication .   If you develop nausea and vomiting, or diarrhea that is not controlled by your medication, call the clinic.  The clinic phone number is [phone]. Office hours are Monday-Friday 8:30am-5:00pm.  BELOW ARE SYMPTOMS THAT SHOULD BE REPORTED IMMEDIATELY:  *FEVER GREATER THAN 101.0 F  *CHILLS WITH OR WITHOUT FEVER  NAUSEA AND VOMITING THAT IS NOT CONTROLLED WITH YOUR NAUSEA MEDICATION  *UNUSUAL SHORTNESS OF BREATH  *UNUSUAL BRUISING OR BLEEDING  TENDERNESS IN MOUTH AND THROAT WITH OR WITHOUT PRESENCE OF ULCERS  *URINARY PROBLEMS  *BOWEL PROBLEMS  UNUSUAL RASH Items with * indicate a potential emergency and should be followed up as soon as possible. If you have an emergency after office hours please contact your primary care physician or go to the nearest emergency department.  Please call the clinic during office hours if you have any questions or concerns.   You may also contact the Patient Navigator at [phone] should you have any questions or need assistance in obtaining follow up care. _____________________________________________________________________ Have you asked about our STAR program?    STAR stands for Survivorship Training and Rehabilitation, and this is a nationally recognized cancer care program that focuses on survivorship and rehabilitation.  Cancer and cancer treatments may cause problems, such as, pain, making you feel tired and keeping you from doing the things that you need or want to do. Cancer rehabilitation can help. Our goal is to reduce these troubling effects and help you have the best quality of life  possible.  You may receive a survey from a nurse that asks questions about your current state of health.  Based on the survey results, all eligible patients will be referred to the [facility] program for an evaluation so we can better serve you! A frequently asked questions sheet is available upon request.

## 2014-09-05 ENCOUNTER — Telehealth: Payer: Self-pay | Admitting: Genetic Counselor

## 2014-09-05 ENCOUNTER — Other Ambulatory Visit (HOSPITAL_COMMUNITY): Payer: Self-pay | Admitting: Oncology

## 2014-09-05 ENCOUNTER — Encounter: Payer: Self-pay | Admitting: Genetic Counselor

## 2014-09-05 DIAGNOSIS — Z1379 Encounter for other screening for genetic and chromosomal anomalies: Secondary | ICD-10-CM | POA: Insufficient documentation

## 2014-09-05 DIAGNOSIS — C50919 Malignant neoplasm of unspecified site of unspecified female breast: Secondary | ICD-10-CM

## 2014-09-05 DIAGNOSIS — C7951 Secondary malignant neoplasm of bone: Secondary | ICD-10-CM

## 2014-09-05 DIAGNOSIS — Z8042 Family history of malignant neoplasm of prostate: Secondary | ICD-10-CM

## 2014-09-05 LAB — CANCER ANTIGEN 27.29: CA 27.29: 40.4 U/mL — ABNORMAL HIGH (ref 0.0–38.6)

## 2014-09-05 MED ORDER — MORPHINE SULFATE ER 30 MG PO TBCR: 30.0000 mg | EXTENDED_RELEASE_TABLET | Freq: Two times a day (BID) | ORAL | Status: DC

## 2014-09-05 NOTE — Telephone Encounter (Signed)
Revealed negative genetic testing on the breast high/moderate risk panel.

## 2014-09-05 NOTE — Progress Notes (Signed)
HPI: Ms. Liddicoat was previously seen in the [place] clinic due to a personal and family history of cancer and concerns regarding a hereditary predisposition to cancer. Please refer to our prior cancer genetics clinic note for more information regarding Ms. Dakin's medical, social and family histories, and our assessment and recommendations, at the time. Ms. Clermont's recent genetic test results were disclosed to her, as were recommendations warranted by these results. These results and recommendations are discussed in more detail below.  GENETIC TEST RESULTS: At the time of Ms. Dietrick's visit, we recommended she pursue genetic testing of the Breast High/Moderate Risk gene panel. The Breast High/Moderate Risk gene panel offered by [facility] includes sequencing and deletion/duplication analysis of the following 9 genes: ATM, BRCA1, BRCA2, CDH1, CHEK2, PALB2, PTEN, STK11, and TP53.  The report date is September 04, 2014.  Genetic testing was normal, and did not reveal a deleterious mutation in these genes. The test report has been scanned into EPIC and is located under the Media tab.   We discussed with Ms. Pasquarella that since the current genetic testing is not perfect, it is possible there may be a gene mutation in one of these genes that current testing cannot detect, but that chance is small. We also discussed, that it is possible that another gene that has not yet been discovered, or that we have not yet tested, is responsible for the cancer diagnoses in the family, and it is, therefore, important to remain in touch with cancer genetics in the future so that we can continue to offer Ms. Brauer the most up to date genetic testing.   ADDITIONAL GENETIC TESTING: We discussed with Ms. Notch that there are other genes that are associated with increased cancer risk that can be analyzed. The laboratories that offer such testing look at these additional genes via a hereditary cancer gene panel. Should  Ms. Spainhower wish to pursue additional genetic testing, we are happy to discuss and coordinate this testing, at any time.    CANCER SCREENING RECOMMENDATIONS: This result is reassuring and indicates that Ms. Kropp likely does not have an increased risk for a future cancer due to a mutation in one of these genes. This normal test also suggests that Ms. Gasparyan's cancer was most likely not due to an inherited predisposition associated with one of these genes.  Most cancers happen by chance and this negative test suggests that her cancer falls into this category.  We, therefore, recommended she continue to follow the cancer management and screening guidelines provided by her oncology and primary healthcare provider.   RECOMMENDATIONS FOR FAMILY MEMBERS: Women in this family might be at some increased risk of developing cancer, over the general population risk, simply due to the family history of cancer. We recommended women in this family have a yearly mammogram beginning at age 41, or 24 years younger than the earliest onset of cancer, an an annual clinical breast exam, and perform monthly breast self-exams. Women in this family should also have a gynecological exam as recommended by their primary provider. All family members should have a colonoscopy by age 61.  FOLLOW-UP: Lastly, we discussed with Ms. Mirabal that cancer genetics is a rapidly advancing field and it is possible that new genetic tests will be appropriate for her and/or her family members in the future. We encouraged her to remain in contact with cancer genetics on an annual basis so we can update her personal and family histories and let her know of  advances in cancer genetics that may benefit this family.   Our contact number was provided. Ms. Beauregard's questions were answered to her satisfaction, and she knows she is welcome to call us at anytime with additional questions or concerns.   Roma Kayser, MS, [facility] Certified Genetic  Counselor [email or web address]

## 2014-09-06 ENCOUNTER — Ambulatory Visit (HOSPITAL_COMMUNITY): Payer: 59

## 2014-09-14 ENCOUNTER — Telehealth (HOSPITAL_COMMUNITY): Payer: Self-pay | Admitting: *Deleted

## 2014-09-14 DIAGNOSIS — C50919 Malignant neoplasm of unspecified site of unspecified female breast: Secondary | ICD-10-CM

## 2014-09-14 DIAGNOSIS — C7951 Secondary malignant neoplasm of bone: Secondary | ICD-10-CM

## 2014-09-14 MED ORDER — OXYCODONE HCL 5 MG PO TABS: 5.0000 mg | ORAL_TABLET | ORAL | Status: DC | PRN

## 2014-09-14 NOTE — Telephone Encounter (Signed)
Pt called complaining of extremely bad lower back pain x 3 days. She has to sit down & stand up very slowly. She can't sit or stand for more than 5 minutes. She is having to rotate side to side. She is supposed to be going to the beach on Sunday. Thayer Jew was putting [place] gel on her back last pm and he was rubbing the gel on both sides of her spine and he accidentally pressed his thumb into your lower spine and she began crying it hurt so badly. Patient had stopped taking her Oxycodone for several weeks. Last pm she took an Ativan and Oxycodone and pt fell asleep. Pain was a little better this am but is gradually getting worse throughout the day. This has been the pattern x 3 days.

## 2014-09-14 NOTE — Telephone Encounter (Signed)
Tom instructed patient to take Dex 4mg  tablet (one tablet in am and other tablet around 4pm). Once pt starts feeling better she may decrease to one Dex a day. If pain gets worse go back up to 2 tabs a day. Pt understands how to do this. Tom also instructed pt to start back on Oxycodone. Pt is agreeable to plan.

## 2014-09-20 ENCOUNTER — Encounter (HOSPITAL_COMMUNITY): Payer: 59

## 2014-09-24 ENCOUNTER — Encounter (HOSPITAL_COMMUNITY): Payer: 59

## 2014-09-25 ENCOUNTER — Encounter (HOSPITAL_BASED_OUTPATIENT_CLINIC_OR_DEPARTMENT_OTHER): Payer: 59 | Admitting: Oncology

## 2014-09-25 ENCOUNTER — Encounter (HOSPITAL_COMMUNITY): Payer: 59

## 2014-09-25 ENCOUNTER — Encounter (HOSPITAL_COMMUNITY): Payer: 59 | Attending: Hematology & Oncology

## 2014-09-25 ENCOUNTER — Encounter (HOSPITAL_COMMUNITY): Payer: Self-pay | Admitting: Oncology

## 2014-09-25 ENCOUNTER — Encounter: Payer: Self-pay | Admitting: *Deleted

## 2014-09-25 VITALS — BP 111/70 | HR 75 | Temp 98.2°F | Resp 18 | Wt 241.0 lb

## 2014-09-25 VITALS — BP 115/65 | HR 67 | Temp 98.2°F | Resp 20

## 2014-09-25 DIAGNOSIS — N951 Menopausal and female climacteric states: Secondary | ICD-10-CM | POA: Diagnosis not present

## 2014-09-25 DIAGNOSIS — T451X5A Adverse effect of antineoplastic and immunosuppressive drugs, initial encounter: Secondary | ICD-10-CM

## 2014-09-25 DIAGNOSIS — C7951 Secondary malignant neoplasm of bone: Secondary | ICD-10-CM | POA: Diagnosis not present

## 2014-09-25 DIAGNOSIS — C229 Malignant neoplasm of liver, not specified as primary or secondary: Secondary | ICD-10-CM

## 2014-09-25 DIAGNOSIS — C787 Secondary malignant neoplasm of liver and intrahepatic bile duct: Secondary | ICD-10-CM

## 2014-09-25 DIAGNOSIS — R232 Flushing: Secondary | ICD-10-CM | POA: Insufficient documentation

## 2014-09-25 DIAGNOSIS — C50919 Malignant neoplasm of unspecified site of unspecified female breast: Secondary | ICD-10-CM

## 2014-09-25 DIAGNOSIS — Z5112 Encounter for antineoplastic immunotherapy: Secondary | ICD-10-CM | POA: Diagnosis not present

## 2014-09-25 LAB — COMPREHENSIVE METABOLIC PANEL
ALBUMIN: 3.6 g/dL (ref 3.5–5.0)
ALT: 25 U/L (ref 14–54)
ANION GAP: 9 (ref 5–15)
AST: 25 U/L (ref 15–41)
Alkaline Phosphatase: 44 U/L (ref 38–126)
BILIRUBIN TOTAL: 0.4 mg/dL (ref 0.3–1.2)
BUN: 15 mg/dL (ref 6–20)
CO2: 28 mmol/L (ref 22–32)
CREATININE: 0.76 mg/dL (ref 0.44–1.00)
Calcium: 9.1 mg/dL (ref 8.9–10.3)
Chloride: 104 mmol/L (ref 101–111)
GFR calc Af Amer: >60 mL/min (ref >60)
GFR calc non Af Amer: >60 mL/min (ref >60)
Glucose, Bld: 113 mg/dL — ABNORMAL HIGH (ref 65–99)
Potassium: 3.9 mmol/L (ref 3.5–5.1)
Sodium: 141 mmol/L (ref 135–145)
TOTAL PROTEIN: 6.2 g/dL — AB (ref 6.5–8.1)

## 2014-09-25 LAB — CBC WITH DIFFERENTIAL/PLATELET
BASOS ABS: 0 10*3/uL (ref 0.0–0.1)
BASOS PCT: 0 % (ref 0–1)
EOS ABS: 0.1 10*3/uL (ref 0.0–0.7)
Eosinophils Relative: 1 % (ref 0–5)
HEMATOCRIT: 38.3 % (ref 36.0–46.0)
HEMOGLOBIN: 12.6 g/dL (ref 12.0–15.0)
Lymphocytes Relative: 14 % (ref 12–46)
Lymphs Abs: 0.9 10*3/uL (ref 0.7–4.0)
MCH: 31.2 pg (ref 26.0–34.0)
MCHC: 32.9 g/dL (ref 30.0–36.0)
MCV: 94.8 fL (ref 78.0–100.0)
MONO ABS: 0.6 10*3/uL (ref 0.1–1.0)
MONOS PCT: 10 % (ref 3–12)
NEUTROS ABS: 5.1 10*3/uL (ref 1.7–7.7)
NEUTROS PCT: 75 % (ref 43–77)
Platelets: 158 10*3/uL (ref 150–400)
RBC: 4.04 MIL/uL (ref 3.87–5.11)
RDW: 14.8 % (ref 11.5–15.5)
WBC: 6.8 10*3/uL (ref 4.0–10.5)

## 2014-09-25 MED ORDER — SODIUM CHLORIDE 0.9 % IJ SOLN
10.0000 mL | INTRAMUSCULAR | Status: DC | PRN
  Administered 2014-09-25: 10 mL
  Filled 2014-09-25: qty 10

## 2014-09-25 MED ORDER — SODIUM CHLORIDE 0.9 % IV SOLN
Freq: Once | INTRAVENOUS | Status: AC
  Administered 2014-09-25: 09:00:00 via INTRAVENOUS

## 2014-09-25 MED ORDER — SODIUM CHLORIDE 0.9 % IV SOLN
6.0000 mg/kg | Freq: Once | INTRAVENOUS | Status: AC
  Administered 2014-09-25: 672 mg via INTRAVENOUS
  Filled 2014-09-25: qty 32

## 2014-09-25 MED ORDER — VENLAFAXINE HCL ER 75 MG PO CP24: ORAL_CAPSULE | ORAL | Status: DC

## 2014-09-25 MED ORDER — DIPHENHYDRAMINE HCL 25 MG PO CAPS
ORAL_CAPSULE | ORAL | Status: AC
  Filled 2014-09-25: qty 2

## 2014-09-25 MED ORDER — HEPARIN SOD (PORK) LOCK FLUSH 100 UNIT/ML IV SOLN
500.0000 [IU] | Freq: Once | INTRAVENOUS | Status: AC | PRN
  Administered 2014-09-25: 500 [IU]
  Filled 2014-09-25: qty 5

## 2014-09-25 MED ORDER — PANTOPRAZOLE SODIUM 40 MG PO TBEC: 40.0000 mg | DELAYED_RELEASE_TABLET | Freq: Every day | ORAL | Status: DC

## 2014-09-25 MED ORDER — DIPHENHYDRAMINE HCL 25 MG PO CAPS
50.0000 mg | ORAL_CAPSULE | Freq: Once | ORAL | Status: AC
  Administered 2014-09-25: 50 mg via ORAL

## 2014-09-25 MED ORDER — ZOLPIDEM TARTRATE 10 MG PO TABS: 10.0000 mg | ORAL_TABLET | Freq: Every evening | ORAL | Status: DC | PRN

## 2014-09-25 MED ORDER — DENOSUMAB 120 MG/1.7ML ~~LOC~~ SOLN
120.0000 mg | Freq: Once | SUBCUTANEOUS | Status: AC
  Administered 2014-09-25: 120 mg via SUBCUTANEOUS
  Filled 2014-09-25: qty 1.7

## 2014-09-25 MED ORDER — ACETAMINOPHEN 325 MG PO TABS
650.0000 mg | ORAL_TABLET | Freq: Once | ORAL | Status: AC
  Administered 2014-09-25: 650 mg via ORAL

## 2014-09-25 MED ORDER — ACETAMINOPHEN 325 MG PO TABS
ORAL_TABLET | ORAL | Status: AC
  Filled 2014-09-25: qty 2

## 2014-09-25 NOTE — Assessment & Plan Note (Signed)
She is currently on Zoloft 100 mg BID.  I will transition her to Effexor XR 150 mg daily.  She will decrease her Zoloft to 100 mg daily x 3-4 days at the same time starting Effexor 75 mg x 3-4 days.  Then, she will stop her Zoloft and increase her Effexor XR to 150 mg daily.  Hopefully this will provide improved Hot Flash control.

## 2014-09-25 NOTE — Patient Instructions (Signed)
[facility] Discharge Instructions for Patients Receiving Chemotherapy  Today you received the following chemotherapy agents Herceptin. Continue every 21 days. Xgeva injection today. Continue Xgeva injection every 4 weeks.  Return as scheduled.   If you develop nausea and vomiting that is not controlled by your nausea medication, call the clinic. If it is after clinic hours your family physician or the after hours number for the clinic or go to the Emergency Department. BELOW ARE SYMPTOMS THAT SHOULD BE REPORTED IMMEDIATELY:  *FEVER GREATER THAN 101.0 F  *CHILLS WITH OR WITHOUT FEVER  NAUSEA AND VOMITING THAT IS NOT CONTROLLED WITH YOUR NAUSEA MEDICATION  *UNUSUAL SHORTNESS OF BREATH  *UNUSUAL BRUISING OR BLEEDING  TENDERNESS IN MOUTH AND THROAT WITH OR WITHOUT PRESENCE OF ULCERS  *URINARY PROBLEMS  *BOWEL PROBLEMS  UNUSUAL RASH Items with * indicate a potential emergency and should be followed up as soon as possible.  I have been informed and understand all the instructions given to me. I know to contact the clinic, my physician, or go to the Emergency Department if any problems should occur. I do not have any questions at this time, but understand that I may call the clinic during office hours or the Patient Navigator at [phone] should I have any questions or need assistance in obtaining follow up care.    __________________________________________  _____________  __________ Signature of Patient or Authorized Representative            Date                   Time    __________________________________________ Nurse's Signature

## 2014-09-25 NOTE — Progress Notes (Signed)
[place] Clinical Social Work  Holiday representative met with patient at [facility] to offer support and assess for needs.  Pt just returned from [place] at the [employer] and had a glorious time with other breast cancer survivors. CSW had referred pt to this program several months ago and was so glad that this worked out for pt. Pt plans to attend Support Group as well on June 7 and CSW registered her for this event. Pt feels so rejuvanated after her trip. No needs at this time. CSW will continue to follow closely.   Clinical Social Work interventions: Supportive listening   [name], [place] Tuesdays 8:30-1pm Wednesdays 8:30-12pm  [phone]

## 2014-09-25 NOTE — Progress Notes (Signed)
[name] presents today for injection per MD orders. Xgeva 120 mg administered SQ in right Abdomen. Administration without incident. Patient tolerated well.  Tolerated Herceptin infusion well.

## 2014-09-25 NOTE — Progress Notes (Signed)
[name], MD [place] [address]  Breast cancer, stage 4, unspecified laterality - Plan: zolpidem (AMBIEN) 10 MG tablet, CBC with Differential, Comprehensive metabolic panel, Cancer antigen 27.29, NM Cardiac [place] [facility]  Bone metastases  Hot flashes due to tamoxifen - Plan: venlafaxine XR (EFFEXOR XR) 75 MG 24 hr capsule  Adenocarcinoma determined by biopsy of liver - Plan: pantoprazole (PROTONIX) 40 MG tablet  CURRENT THERAPY: Herceptin/Tamoxifen/XGEVA  INTERVAL HISTORY: [name] 50 y.o. female returns for followup of Stage IV adenocarcinoma with metastases to liver and bone.    Breast cancer, stage 4   04/25/2014 Initial Diagnosis Breast cancer, stage 4   04/25/2014 Imaging CT abdomen/pelvia with widespread metastatic disease to the liver, multiple lytic lesions throughout spine and pelvis. No FX or epidural tumor identified   04/26/2014 Imaging CT head unremarkable   04/26/2014 Imaging CT chest with no lung mass or pulmonary nodules, no adenopathy. Lytic bone lesions, right 2nd rib   04/27/2014 Initial Biopsy U/S guided liver biopsy, lesion in anterior and inferior left hepatic lobe biopsied   04/27/2014 Pathology Results Metastatic adenocarcinoma, CK7, ER+, patchy positivity with PR. Possible primary includes breast, less likely gynecologic   05/15/2014 Mammogram BI-RADS CATEGORY  2: Benign Finding(s)   05/16/2014 PET scan 1. Intensely hypermetabolic hepatic metastasis. 2. Widespread hypermetabolic skeletal lesions. 3. No primary adenocarcinoma identified by FDG PET imaging.   05/19/2014 Imaging MUGA- Left ventricular ejection fracture greater than 70%.   05/21/2014 Breast MRI No suspicious masses or enhancement within the breasts. No axillary adenopathy.   05/22/2014 - 07/03/2014 Antibody Plan Herceptin/Perjeta/Tamoxifen   06/12/2014 - 07/03/2014 Chemotherapy Taxotere added secondary to persistent abdominal and back pain   06/17/2014 - 06/19/2014 Hospital  Admission Neutropenia, fever, diarrhea, nausea, vomiting   06/20/2014 - 07/10/2014 Radiation Therapy Dr. Thea Silversmith 12 fractions to L3-S3 (30 Gy) and left scapula (20 Gy).    07/03/2014 Adverse Reaction Perjeta- induced diarrhea.  Perjeta discontinued   07/16/2014 - 07/20/2014 Hospital Admission Electrolyte abnormalities, and diarrhea.  Suspect Perjeta-induced diarrhea.  Negative GI work-up.   07/24/2014 -  Chemotherapy Herceptin/Tamoxifen/Xgeva   08/21/2014 Imaging MUGA- Left ventricular ejection fraction equals 71%.   08/24/2014 PET scan Dramatic reduction in metabolic activity of the widespread liver metastasis. Liver metastasis now have metabolic activity equal to background normal liver activity. Liver has a nodular contour. Marked reduction in metabolic activity of skeletal lesions..     I personally reviewed and went over laboratory results with the patient.  The results are noted within this dictation.  She had a wonderful retreat and made lifelong friends.  "I was with a group in a similar situation as me and I felt NORMAL."  She has made excellent contacts and they have remained in touch since the retreat's completion.  She reports her back pain is resolved.  She complains of Tamoxifen-induced hot flashes.   Otherwise, oncologically, she denies any complaints and ROS questioning is negative.   Past Medical History  Diagnosis Date  . PONV (postoperative nausea and vomiting)     pt states scope patch does well  . Anemia   . Depression   . Anxiety   . Headache(784.0)     has migraines - medication controls  . Breast cancer 04/2014    Presumed dx; stage 4 w/ mets to bone and liver  . Bony metastasis   . Family history of prostate cancer   . Hot flashes due to tamoxifen 09/25/2014  has Hyperlipidemia; Depression; Breast cancer, stage 4; Bone metastases; DVT (deep venous thrombosis); GERD (gastroesophageal reflux disease); Family history of prostate cancer; Genetic testing; and Hot  flashes due to tamoxifen on her problem list.     has No Known Allergies.  Ms. Bobst had no medications administered during this visit.  Past Surgical History  Procedure Laterality Date  . Right rotator cuff      2002  . Neck fusion      2003  . Laparoscopic cholecystectomy      2004  . Right knee arthroscopy      2005  . Colon surgery      2008  . Appendectomy      2008  . Abdominal hysterectomy    . Breast reduction surgery  03/17/2011    Procedure: MAMMARY REDUCTION BILATERAL (BREAST);  Surgeon: Mary A Contogiannis;  Location: [facility];  Service: Plastics;  Laterality: Bilateral;  . Colonoscopy N/A 07/13/2013    Procedure: COLONOSCOPY;  Surgeon: Rogene Houston, MD;  [facility];  Service: Endoscopy;  Laterality: N/A;  930  . Liver biopsy  04/2014  . Portacath placement    . Esophagogastroduodenoscopy N/A 05/25/2014    Procedure: ESOPHAGOGASTRODUODENOSCOPY (EGD);  Surgeon: Rogene Houston, MD;  Location: [facility];  Service: Endoscopy;  Laterality: N/A;  155    Denies any headaches, dizziness, double vision, fevers, chills, night sweats, nausea, vomiting, diarrhea, constipation, chest pain, heart palpitations, shortness of breath, blood in stool, black tarry stool, urinary pain, urinary burning, urinary frequency, hematuria.   PHYSICAL EXAMINATION  ECOG PERFORMANCE STATUS: 0 - Asymptomatic  Filed Vitals:   09/25/14 0853  BP: 111/70  Pulse: 75  Temp: 98.2 F (36.8 C)  Resp: 18    GENERAL:alert, no distress, well nourished, well developed, comfortable, cooperative, obese, smiling and hair growing back SKIN: skin color, texture, turgor are normal, no rashes or significant lesions HEAD: Normocephalic, No masses, lesions, tenderness or abnormalities EYES: normal, PERRLA, EOMI, Conjunctiva are pink and non-injected EARS: External ears normal OROPHARYNX:lips, buccal mucosa, and tongue normal and mucous membranes are moist  NECK: supple, no  adenopathy, thyroid normal size, non-tender, without nodularity, no stridor, non-tender, trachea midline LYMPH:  no palpable lymphadenopathy BREAST:not examined LUNGS: clear to auscultation  HEART: regular rate & rhythm, no murmurs, no gallops, S1 normal and S2 normal ABDOMEN:abdomen soft, non-tender, normal bowel sounds and no masses or organomegaly BACK: Back symmetric, no curvature., No CVA tenderness EXTREMITIES:less then 2 second capillary refill, no joint deformities, effusion, or inflammation, no edema, no skin discoloration, no clubbing, no cyanosis  NEURO: alert & oriented x 3 with fluent speech, no focal motor/sensory deficits, gait normal    LABORATORY DATA: CBC    Component Value Date/Time   WBC 6.8 09/25/2014 0905   RBC 4.04 09/25/2014 0905   HGB 12.6 09/25/2014 0905   HCT 38.3 09/25/2014 0905   PLT 158 09/25/2014 0905   MCV 94.8 09/25/2014 0905   MCH 31.2 09/25/2014 0905   MCHC 32.9 09/25/2014 0905   RDW 14.8 09/25/2014 0905   LYMPHSABS 0.9 09/25/2014 0905   MONOABS 0.6 09/25/2014 0905   EOSABS 0.1 09/25/2014 0905   BASOSABS 0.0 09/25/2014 0905      Chemistry      Component Value Date/Time   NA 141 09/25/2014 0905   K 3.9 09/25/2014 0905   CL 104 09/25/2014 0905   CO2 28 09/25/2014 0905   BUN 15 09/25/2014 0905   CREATININE 0.76  09/25/2014 0905      Component Value Date/Time   CALCIUM 9.1 09/25/2014 0905   ALKPHOS 44 09/25/2014 0905   AST 25 09/25/2014 0905   ALT 25 09/25/2014 0905   BILITOT 0.4 09/25/2014 0905     Lab Results  Component Value Date   LABCA2 40.4* 09/04/2014     ASSESSMENT AND PLAN:  Breast cancer, stage 4 Stage IV adenocarcinoma of breast without discovery of primary tumor with metastases to liver and bone.  On Herceptin/Tamoxifen/XGEVA.  Continue treatment as planned.   Tamoxifen compliance encouraged.  Return in 3 weeks for follow-up and Herceptin treatment.  Refill on Ambien and Protonix provided today.  She will  decrease her Dexamethasone to 1/2 tablet daily x 3 days; then stop.  She was restarted on this for severe back pain prior to a retreat.  She notes a significant improvement in her back pain.  This could be a flare from her malignancy causing this pain and was treated as such.    MUGA is due in July 2016.    Bone metastases On Ca++, Vit D, and Xgeva.   Hot flashes due to tamoxifen She is currently on Zoloft 100 mg BID.  I will transition her to Effexor XR 150 mg daily.  She will decrease her Zoloft to 100 mg daily x 3-4 days at the same time starting Effexor 75 mg x 3-4 days.  Then, she will stop her Zoloft and increase her Effexor XR to 150 mg daily.  Hopefully this will provide improved Hot Flash control.    THERAPY PLAN:  Excellent response to therapy thus far.  Continue treatment as planned and will monitor for progression of disease.  All questions were answered. The patient knows to call the clinic with any problems, questions or concerns. We can certainly see the patient much sooner if necessary.  Patient and plan discussed with Dr. Ancil Linsey and she is in agreement with the aforementioned.   This note is electronically signed by: Robynn Pane 09/25/2014 9:59 AM

## 2014-09-25 NOTE — Assessment & Plan Note (Addendum)
Stage IV adenocarcinoma of breast without discovery of primary tumor with metastases to liver and bone.  On Herceptin/Tamoxifen/XGEVA.  Continue treatment as planned.   Tamoxifen compliance encouraged.  Return in 3 weeks for follow-up and Herceptin treatment.  Refill on Ambien and Protonix provided today.  She will decrease her Dexamethasone to 1/2 tablet daily x 3 days; then stop.  She was restarted on this for severe back pain prior to a retreat.  She notes a significant improvement in her back pain.  This could be a flare from her malignancy causing this pain and was treated as such.    MUGA is due in July 2016.

## 2014-09-25 NOTE — Assessment & Plan Note (Signed)
On Ca++, Vit D, and Xgeva.

## 2014-09-25 NOTE — Patient Instructions (Signed)
..  [place] at [facility] Discharge Instructions  RECOMMENDATIONS MADE BY THE CONSULTANT AND ANY TEST RESULTS WILL BE SENT TO YOUR REFERRING PHYSICIAN.  Decrease zoloft to 1 tablet daily x 3-4 days then stop Start Effexor 1 tablet daily x 3-4 days then 2 tabs daily Decrease Dexamethasone to 1/2 tablet daily x 3-4 days then stop [place] in July as scheduled Return in 3 weeks  Thank you for choosing [place] at [facility] to provide your oncology and hematology care.  To afford each patient quality time with our provider, please arrive at least 15 minutes before your scheduled appointment time.    You need to re-schedule your appointment should you arrive 10 or more minutes late.  We strive to give you quality time with our providers, and arriving late affects you and other patients whose appointments are after yours.  Also, if you no show three or more times for appointments you may be dismissed from the clinic at the providers discretion.     Again, thank you for choosing [facility].  Our hope is that these requests will decrease the amount of time that you wait before being seen by our physicians.       _____________________________________________________________  Should you have questions after your visit to [facility], please contact our office at [phone] between the hours of 8:30 a.m. and 4:30 p.m.  Voicemails left after 4:30 p.m. will not be returned until the following business day.  For prescription refill requests, have your pharmacy contact our office.

## 2014-10-05 ENCOUNTER — Ambulatory Visit (HOSPITAL_COMMUNITY)
Admission: RE | Admit: 2014-10-05 | Discharge: 2014-10-05 | Disposition: A | Discharge status: Home / Self Care | Payer: 59 | Source: Ambulatory Visit | Attending: Oncology | Admitting: Oncology

## 2014-10-05 ENCOUNTER — Encounter (HOSPITAL_COMMUNITY): Payer: Self-pay | Admitting: Oncology

## 2014-10-05 ENCOUNTER — Encounter (HOSPITAL_BASED_OUTPATIENT_CLINIC_OR_DEPARTMENT_OTHER): Payer: 59 | Admitting: Oncology

## 2014-10-05 VITALS — BP 130/70 | HR 94 | Temp 98.6°F | Resp 16 | Wt 242.1 lb

## 2014-10-05 DIAGNOSIS — C50919 Malignant neoplasm of unspecified site of unspecified female breast: Secondary | ICD-10-CM

## 2014-10-05 DIAGNOSIS — R42 Dizziness and giddiness: Secondary | ICD-10-CM | POA: Diagnosis not present

## 2014-10-05 DIAGNOSIS — C787 Secondary malignant neoplasm of liver and intrahepatic bile duct: Secondary | ICD-10-CM | POA: Insufficient documentation

## 2014-10-05 DIAGNOSIS — R110 Nausea: Secondary | ICD-10-CM | POA: Diagnosis not present

## 2014-10-05 DIAGNOSIS — C229 Malignant neoplasm of liver, not specified as primary or secondary: Secondary | ICD-10-CM | POA: Diagnosis not present

## 2014-10-05 DIAGNOSIS — R002 Palpitations: Secondary | ICD-10-CM

## 2014-10-05 DIAGNOSIS — R1011 Right upper quadrant pain: Secondary | ICD-10-CM | POA: Diagnosis not present

## 2014-10-05 DIAGNOSIS — C7931 Secondary malignant neoplasm of brain: Secondary | ICD-10-CM | POA: Diagnosis not present

## 2014-10-05 LAB — COMPREHENSIVE METABOLIC PANEL
ALBUMIN: 3.7 g/dL (ref 3.5–5.0)
ALT: 35 U/L (ref 14–54)
ANION GAP: 8 (ref 5–15)
AST: 29 U/L (ref 15–41)
Alkaline Phosphatase: 54 U/L (ref 38–126)
BUN: 9 mg/dL (ref 6–20)
CALCIUM: 9.4 mg/dL (ref 8.9–10.3)
CHLORIDE: 102 mmol/L (ref 101–111)
CO2: 29 mmol/L (ref 22–32)
Creatinine, Ser: 0.7 mg/dL (ref 0.44–1.00)
GFR calc non Af Amer: >60 mL/min (ref >60)
GLUCOSE: 110 mg/dL — AB (ref 65–99)
Potassium: 4.4 mmol/L (ref 3.5–5.1)
SODIUM: 139 mmol/L (ref 135–145)
TOTAL PROTEIN: 6.5 g/dL (ref 6.5–8.1)
Total Bilirubin: 0.4 mg/dL (ref 0.3–1.2)

## 2014-10-05 LAB — CBC WITH DIFFERENTIAL/PLATELET
BASOS PCT: 0 % (ref 0–1)
Basophils Absolute: 0 10*3/uL (ref 0.0–0.1)
EOS PCT: 1 % (ref 0–5)
Eosinophils Absolute: 0.1 10*3/uL (ref 0.0–0.7)
HCT: 39 % (ref 36.0–46.0)
Hemoglobin: 12.5 g/dL (ref 12.0–15.0)
LYMPHS PCT: 16 % (ref 12–46)
Lymphs Abs: 0.9 10*3/uL (ref 0.7–4.0)
MCH: 30.4 pg (ref 26.0–34.0)
MCHC: 32.1 g/dL (ref 30.0–36.0)
MCV: 94.9 fL (ref 78.0–100.0)
Monocytes Absolute: 0.4 10*3/uL (ref 0.1–1.0)
Monocytes Relative: 7 % (ref 3–12)
NEUTROS ABS: 4.3 10*3/uL (ref 1.7–7.7)
NEUTROS PCT: 76 % (ref 43–77)
PLATELETS: 172 10*3/uL (ref 150–400)
RBC: 4.11 MIL/uL (ref 3.87–5.11)
RDW: 14.6 % (ref 11.5–15.5)
WBC: 5.7 10*3/uL (ref 4.0–10.5)

## 2014-10-05 MED ORDER — SODIUM CHLORIDE 0.9 % IJ SOLN
INTRAMUSCULAR | Status: AC
  Filled 2014-10-05: qty 15

## 2014-10-05 MED ORDER — GADOBENATE DIMEGLUMINE 529 MG/ML IV SOLN
20.0000 mL | Freq: Once | INTRAVENOUS | Status: AC | PRN
  Administered 2014-10-05: 20 mL via INTRAVENOUS

## 2014-10-05 MED ORDER — DEXAMETHASONE 4 MG PO TABS: 4.0000 mg | ORAL_TABLET | Freq: Two times a day (BID) | ORAL | Status: DC

## 2014-10-05 NOTE — Patient Instructions (Signed)
[place] at [facility] Discharge Instructions  RECOMMENDATIONS MADE BY THE CONSULTANT AND ANY TEST RESULTS WILL BE SENT TO YOUR REFERRING PHYSICIAN.  Exam and discussion by Robynn Pane, PA-C EKG was ok.   Thank you for choosing [place] at [facility] to provide your oncology and hematology care.  To afford each patient quality time with our provider, please arrive at least 15 minutes before your scheduled appointment time.    You need to re-schedule your appointment should you arrive 10 or more minutes late.  We strive to give you quality time with our providers, and arriving late affects you and other patients whose appointments are after yours.  Also, if you no show three or more times for appointments you may be dismissed from the clinic at the providers discretion.     Again, thank you for choosing [facility].  Our hope is that these requests will decrease the amount of time that you wait before being seen by our physicians.       _____________________________________________________________  Should you have questions after your visit to [facility], please contact our office at [phone] between the hours of 8:30 a.m. and 4:30 p.m.  Voicemails left after 4:30 p.m. will not be returned until the following business day.  For prescription refill requests, have your pharmacy contact our office.

## 2014-10-05 NOTE — Progress Notes (Addendum)
[name] is seen as a work-in today.  She called the clinic with the following complaints: lightheadedness, dizziness, nausea, and intermittent episodes of tachycardia since yesterday.  "Since my last treatment, I have not felt right."  She notes the aforementioned symptoms x 1-2 days.  She denies any vomiting.  She reports that she is not sleeping well and this is compounded by night time hot flashes.    She denies any urinary complaints, chest pain, cough, sputum production, double vision.  She does note some right sided abdominal pain that comes and goes.  Her recent PET scan demonstrated significant improvement in hepatic metastases, but we should evaluate just to make sure and also evaluate for gallstones.  BP 130/70 mmHg  Pulse 94  Temp(Src) 98.6 F (37 C) (Oral)  Resp 16  Wt 242 lb 1.6 oz (109.816 kg)  SpO2 96% Gen: Pleasant, NAD. HEENT: atraumatic normocephalic Neck: No lymph nodes Skin: Warm and dry Cardiac: RRR Lungs: CTA B/L, resonant to percussion Extremities: No erythema, heat, or pain. Neuro: A and O x 3 with fluent speech and no focal deficits.   Assessment: 1. Brain mets   Plan: 1. EKG STAT 2. MRI brain w and wo contrast today to evaluate for brain metastases. 3. Labs today: CBC diff, CMET 4. RUQ US abdomen due to pain on palpation. 5. Dex 4 mg BID 6. Referral to Rad Onc 7. Return as planned for Herceptin 8. Return as planned for follow-up.  Patient and plan discussed with Dr. Ancil Linsey and she is in agreement with the aforementioned.   More than 50% of the time spent with the patient was utilized for counseling and coordination of care.  KEFALAS,THOMAS 10/05/2014 4:45 PM   MRI results called by radiology. (See below)  I spoke with patient and family about the results. Discussed HER-2 positive breast cancer and brain metastases. Discussed whole brain XRT and referral to [place]. Will recommend additional restaging at f/u. If remainder of disease still  well controlled will discuss continuing tamoxifen/herceptin.  Can consider [place] in future with our without XELODA.  Have advised patient not to drive.  PE: limited exam Alert, appropriate, pleasant HEENT:  EOMI, PERRL, oropharynx clear and tongue midline NEURO: Strength preserved throughout, nonfocal.  F/U with me in 2 weeks. Rad Onc on Tuesday. Patient advised to present to ED for any worsening symptoms. Have not addressed prognosis given brain metastases, they understand stage IV disease in general but need to address prognosis with brain metastases. Patient and family were NOT ready to discuss today. Donald Pore MD

## 2014-10-05 NOTE — Progress Notes (Signed)
[name] presented for labwork. Labs per MD order drawn via Peripheral Line 23 gauge needle inserted in left AC  Good blood return present. Procedure without incident.  Needle removed intact. Patient tolerated procedure well.

## 2014-10-09 ENCOUNTER — Other Ambulatory Visit (HOSPITAL_COMMUNITY): Payer: Self-pay | Admitting: Oncology

## 2014-10-09 ENCOUNTER — Telehealth (HOSPITAL_COMMUNITY): Payer: Self-pay | Admitting: *Deleted

## 2014-10-09 DIAGNOSIS — C7951 Secondary malignant neoplasm of bone: Secondary | ICD-10-CM

## 2014-10-09 DIAGNOSIS — C50919 Malignant neoplasm of unspecified site of unspecified female breast: Secondary | ICD-10-CM

## 2014-10-09 MED ORDER — MORPHINE SULFATE ER 30 MG PO TBCR: 30.0000 mg | EXTENDED_RELEASE_TABLET | Freq: Two times a day (BID) | ORAL | Status: DC

## 2014-10-09 NOTE — Telephone Encounter (Signed)
Refilled.   Robynn Pane, PA-C 10/09/2014 12:49 PM

## 2014-10-16 ENCOUNTER — Encounter (HOSPITAL_COMMUNITY): Payer: 59 | Attending: Hematology & Oncology | Admitting: Hematology & Oncology

## 2014-10-16 ENCOUNTER — Encounter (HOSPITAL_COMMUNITY): Payer: 59

## 2014-10-16 ENCOUNTER — Encounter (HOSPITAL_BASED_OUTPATIENT_CLINIC_OR_DEPARTMENT_OTHER): Payer: 59

## 2014-10-16 ENCOUNTER — Encounter (HOSPITAL_COMMUNITY): Payer: Self-pay | Admitting: Hematology & Oncology

## 2014-10-16 VITALS — BP 111/59 | HR 72 | Temp 98.7°F | Resp 16 | Wt 244.7 lb

## 2014-10-16 VITALS — BP 115/66 | HR 65 | Temp 98.2°F | Resp 18

## 2014-10-16 DIAGNOSIS — C787 Secondary malignant neoplasm of liver and intrahepatic bile duct: Secondary | ICD-10-CM

## 2014-10-16 DIAGNOSIS — C7931 Secondary malignant neoplasm of brain: Secondary | ICD-10-CM

## 2014-10-16 DIAGNOSIS — C229 Malignant neoplasm of liver, not specified as primary or secondary: Secondary | ICD-10-CM | POA: Diagnosis present

## 2014-10-16 DIAGNOSIS — C7951 Secondary malignant neoplasm of bone: Secondary | ICD-10-CM | POA: Insufficient documentation

## 2014-10-16 DIAGNOSIS — C50919 Malignant neoplasm of unspecified site of unspecified female breast: Secondary | ICD-10-CM | POA: Diagnosis not present

## 2014-10-16 DIAGNOSIS — Z5112 Encounter for antineoplastic immunotherapy: Secondary | ICD-10-CM

## 2014-10-16 LAB — CBC WITH DIFFERENTIAL/PLATELET
BASOS ABS: 0 10*3/uL (ref 0.0–0.1)
BASOS PCT: 0 % (ref 0–1)
EOS ABS: 0 10*3/uL (ref 0.0–0.7)
EOS PCT: 0 % (ref 0–5)
HEMATOCRIT: 40.3 % (ref 36.0–46.0)
Hemoglobin: 13 g/dL (ref 12.0–15.0)
Lymphocytes Relative: 12 % (ref 12–46)
Lymphs Abs: 1.3 10*3/uL (ref 0.7–4.0)
MCH: 30.5 pg (ref 26.0–34.0)
MCHC: 32.3 g/dL (ref 30.0–36.0)
MCV: 94.6 fL (ref 78.0–100.0)
MONO ABS: 0.8 10*3/uL (ref 0.1–1.0)
Monocytes Relative: 7 % (ref 3–12)
Neutro Abs: 8.7 10*3/uL — ABNORMAL HIGH (ref 1.7–7.7)
Neutrophils Relative %: 80 % — ABNORMAL HIGH (ref 43–77)
Platelets: 188 10*3/uL (ref 150–400)
RBC: 4.26 MIL/uL (ref 3.87–5.11)
RDW: 14.2 % (ref 11.5–15.5)
WBC: 10.8 10*3/uL — ABNORMAL HIGH (ref 4.0–10.5)

## 2014-10-16 LAB — COMPREHENSIVE METABOLIC PANEL
ALK PHOS: 44 U/L (ref 38–126)
ALT: 30 U/L (ref 14–54)
ANION GAP: 9 (ref 5–15)
AST: 21 U/L (ref 15–41)
Albumin: 3.7 g/dL (ref 3.5–5.0)
BUN: 17 mg/dL (ref 6–20)
CHLORIDE: 99 mmol/L — AB (ref 101–111)
CO2: 27 mmol/L (ref 22–32)
CREATININE: 0.68 mg/dL (ref 0.44–1.00)
Calcium: 8.8 mg/dL — ABNORMAL LOW (ref 8.9–10.3)
Glucose, Bld: 109 mg/dL — ABNORMAL HIGH (ref 65–99)
POTASSIUM: 3.9 mmol/L (ref 3.5–5.1)
Sodium: 135 mmol/L (ref 135–145)
TOTAL PROTEIN: 6.6 g/dL (ref 6.5–8.1)
Total Bilirubin: 0.4 mg/dL (ref 0.3–1.2)

## 2014-10-16 MED ORDER — SODIUM CHLORIDE 0.9 % IV SOLN
Freq: Once | INTRAVENOUS | Status: AC
  Administered 2014-10-16: 10:00:00 via INTRAVENOUS

## 2014-10-16 MED ORDER — RIVAROXABAN (XARELTO) VTE STARTER PACK (15 & 20 MG): ORAL_TABLET | ORAL | Status: DC

## 2014-10-16 MED ORDER — SODIUM CHLORIDE 0.9 % IJ SOLN
10.0000 mL | INTRAMUSCULAR | Status: DC | PRN
Start: 2014-10-16 — End: 2014-10-16

## 2014-10-16 MED ORDER — DIPHENHYDRAMINE HCL 25 MG PO CAPS
50.0000 mg | ORAL_CAPSULE | Freq: Once | ORAL | Status: AC
  Administered 2014-10-16: 50 mg via ORAL
  Filled 2014-10-16: qty 2

## 2014-10-16 MED ORDER — TRASTUZUMAB CHEMO INJECTION 440 MG
6.0000 mg/kg | Freq: Once | INTRAVENOUS | Status: AC
  Administered 2014-10-16: 672 mg via INTRAVENOUS
  Filled 2014-10-16: qty 32

## 2014-10-16 MED ORDER — ACETAMINOPHEN 325 MG PO TABS
650.0000 mg | ORAL_TABLET | Freq: Once | ORAL | Status: AC
  Administered 2014-10-16: 650 mg via ORAL
  Filled 2014-10-16: qty 2

## 2014-10-16 MED ORDER — HEPARIN SOD (PORK) LOCK FLUSH 100 UNIT/ML IV SOLN
500.0000 [IU] | Freq: Once | INTRAVENOUS | Status: AC | PRN
  Administered 2014-10-16: 500 [IU]
  Filled 2014-10-16: qty 5

## 2014-10-16 NOTE — Progress Notes (Signed)
[name] Tolerated chemotherapy well today Discharged ambulatory

## 2014-10-16 NOTE — Patient Instructions (Signed)
[facility] Discharge Instructions for Patients Receiving Chemotherapy  Today you received the following chemotherapy agents herceptin Follow up as scheduled Call the clinic if you have any questions or concerns   To help prevent nausea and vomiting after your treatment, we encourage you to take your nausea medication  If you develop nausea and vomiting, or diarrhea that is not controlled by your medication, call the clinic.  The clinic phone number is [phone]. Office hours are Monday-Friday 8:30am-5:00pm.  BELOW ARE SYMPTOMS THAT SHOULD BE REPORTED IMMEDIATELY:  *FEVER GREATER THAN 101.0 F  *CHILLS WITH OR WITHOUT FEVER  NAUSEA AND VOMITING THAT IS NOT CONTROLLED WITH YOUR NAUSEA MEDICATION  *UNUSUAL SHORTNESS OF BREATH  *UNUSUAL BRUISING OR BLEEDING  TENDERNESS IN MOUTH AND THROAT WITH OR WITHOUT PRESENCE OF ULCERS  *URINARY PROBLEMS  *BOWEL PROBLEMS  UNUSUAL RASH Items with * indicate a potential emergency and should be followed up as soon as possible. If you have an emergency after office hours please contact your primary care physician or go to the nearest emergency department.  Please call the clinic during office hours if you have any questions or concerns.   You may also contact the Patient Navigator at [phone] should you have any questions or need assistance in obtaining follow up care. _____________________________________________________________________ Have you asked about our STAR program?    STAR stands for Survivorship Training and Rehabilitation, and this is a nationally recognized cancer care program that focuses on survivorship and rehabilitation.  Cancer and cancer treatments may cause problems, such as, pain, making you feel tired and keeping you from doing the things that you need or want to do. Cancer rehabilitation can help. Our goal is to reduce these troubling effects and help you have the best quality of life  possible.  You may receive a survey from a nurse that asks questions about your current state of health.  Based on the survey results, all eligible patients will be referred to the [facility] program for an evaluation so we can better serve you! A frequently asked questions sheet is available upon request.

## 2014-10-16 NOTE — Progress Notes (Signed)
Allison Labrum, MD [place] [address]    Breast cancer, stage 4   04/25/2014 Initial Diagnosis Breast cancer, stage 4   04/25/2014 Imaging CT abdomen/pelvia with widespread metastatic disease to the liver, multiple lytic lesions throughout spine and pelvis. No FX or epidural tumor identified   04/26/2014 Imaging CT head unremarkable   04/26/2014 Imaging CT chest with no lung mass or pulmonary nodules, no adenopathy. Lytic bone lesions, right 2nd rib   04/27/2014 Initial Biopsy U/S guided liver biopsy, lesion in anterior and inferior left hepatic lobe biopsied   04/27/2014 Pathology Results Metastatic adenocarcinoma, CK7, ER+, patchy positivity with PR. Possible primary includes breast, less likely gynecologic   05/15/2014 Mammogram BI-RADS CATEGORY  2: Benign Finding(s)   05/16/2014 PET scan 1. Intensely hypermetabolic hepatic metastasis. 2. Widespread hypermetabolic skeletal lesions. 3. No primary adenocarcinoma identified by FDG PET imaging.   05/19/2014 Imaging MUGA- Left ventricular ejection fracture greater than 70%.   05/21/2014 Breast MRI No suspicious masses or enhancement within the breasts. No axillary adenopathy.   05/22/2014 - 07/03/2014 Antibody Plan Herceptin/Perjeta/Tamoxifen   06/12/2014 - 07/03/2014 Chemotherapy Taxotere added secondary to persistent abdominal and back pain   06/17/2014 - 06/19/2014 Hospital Admission Neutropenia, fever, diarrhea, nausea, vomiting   06/20/2014 - 07/10/2014 Radiation Therapy Dr. Thea Silversmith 12 fractions to L3-S3 (30 Gy) and left scapula (20 Gy).    07/03/2014 Adverse Reaction Perjeta- induced diarrhea.  Perjeta discontinued   07/16/2014 - 07/20/2014 Hospital Admission Electrolyte abnormalities, and diarrhea.  Suspect Perjeta-induced diarrhea.  Negative GI work-up.   07/24/2014 -  Chemotherapy Herceptin/Tamoxifen/Xgeva   08/21/2014 Imaging MUGA- Left ventricular ejection fraction equals 71%.   08/24/2014 PET scan Dramatic reduction in metabolic  activity of the widespread liver metastasis. Liver metastasis now have metabolic activity equal to background normal liver activity. Liver has a nodular contour. Marked reduction in metabolic activity of skeletal lesions..   10/05/2014 Progression Widespread metastatic disease to the brain as described. Between 20 and 30 intracranial metastatic deposits are now seen. No midline shift or incipient herniation   10/09/2014 -  Radiation Therapy Whole Brain XRT    Stage IV adenocarcinoma with metastases to liver and bone  Had last mammogram in [place] this year, Dr. Gaetano Net  Colonoscopy 05/18/2013  Breast Reduction 03/17/2011  CURRENT THERAPY: Herceptin/Tamoxifen/XGEVA //  INTERVAL HISTORY: [name] 49 y.o. female returns for follow-up of stage IV adenocarcinoma.  She is here today with her husband and sister. She feels tired, dizzy, and light headed due to the radiation. She has had 6 out of her total 15 radiation treatments so far. She notes she gets rare headaches that are alleviated by tylenol  She notes that Dr. Pablo Ledger has told her to do her bucket list. She'll be travelling to [place] from July 8-11 to pick up her granddaughter.  She has instances where she feels like she is "on fire from the inside". She says it last about 10 minutes then she is freezing after. She has tried peppermint oil and peppermint baths. She often has to take off head wraps because they are full of sweat. She believes these may just be hot flashes. Discussed that this could be menopause coupled with Tamoxifen.  She is very accepting of her diagnosis and death.   MEDICAL HISTORY: Past Medical History  Diagnosis Date  . PONV (postoperative nausea and vomiting)     pt states scope patch does well  . Anemia   . Depression   .  Anxiety   . Headache(784.0)     has migraines - medication controls  . Breast cancer 04/2014    Presumed dx; stage 4 w/ mets to bone and liver  . Bony metastasis   . Family  history of prostate cancer   . Hot flashes due to tamoxifen 09/25/2014    has Hyperlipidemia; Depression; Breast cancer, stage 4; Bone metastases; DVT (deep venous thrombosis); GERD (gastroesophageal reflux disease); Family history of prostate cancer; Genetic testing; and Hot flashes due to tamoxifen on her problem list.      has No Known Allergies.  Ms. Lepkowski does not currently have medications on file.  SURGICAL HISTORY: Past Surgical History  Procedure Laterality Date  . Right rotator cuff      2002  . Neck fusion      2003  . Laparoscopic cholecystectomy      2004  . Right knee arthroscopy      2005  . Colon surgery      2008  . Appendectomy      2008  . Abdominal hysterectomy    . Breast reduction surgery  03/17/2011    Procedure: MAMMARY REDUCTION BILATERAL (BREAST);  Surgeon: Mary A Contogiannis;  Location: [facility];  Service: Plastics;  Laterality: Bilateral;  . Colonoscopy N/A 07/13/2013    Procedure: COLONOSCOPY;  Surgeon: Rogene Houston, MD;  [facility];  Service: Endoscopy;  Laterality: N/A;  930  . Liver biopsy  04/2014  . Portacath placement    . Esophagogastroduodenoscopy N/A 05/25/2014    Procedure: ESOPHAGOGASTRODUODENOSCOPY (EGD);  Surgeon: Rogene Houston, MD;  Location: [facility];  Service: Endoscopy;  Laterality: N/A;  155    SOCIAL HISTORY: History   Social History  . Marital Status: Married    Spouse Name: N/A  . Number of Children: N/A  . Years of Education: N/A   Occupational History  . Not on file.   Social History Main Topics  . Smoking status: Former Smoker    Types: Cigarettes    Quit date: 08/20/1994  . Smokeless tobacco: Never Used  . Alcohol Use: Yes     Comment: Occasionally  . Drug Use: No  . Sexual Activity: Yes    Birth Control/ Protection: Surgical   Other Topics Concern  . Not on file   Social History Narrative    FAMILY HISTORY: Family History  Problem Relation Age of Onset  .  Diabetes Father   . Heart attack Maternal Grandmother 30    multiple over lifetime.  . Cancer Maternal Grandmother 61    NOS  . Prostate cancer Maternal Grandfather     dx in his 81s  . Lung cancer Paternal Grandfather     dx <50  . Lymphoma Maternal Aunt     dx in her 47s  . Melanoma Cousin 12    maternal first cousin  . Brain cancer Cousin     paternal first cousin dx under 7  . Prostate cancer Other     MGF's father  . Colon cancer Other     MGM's mother  Her son lives in [place]. Her daughter lives in [place].  Review of Systems  Constitutional: Positive for hot flashes and malaise/fatigue. Negative for fever, chills, weight loss.  Hot flashes coupled with Tamoxifen. Fatigue due to radiation treatment. HENT: Negative for congestion, hearing loss, nosebleeds, sore throat and tinnitus.   Eyes: Negative for blurred vision, double vision, pain and discharge.  Respiratory: Negative  for cough, hemoptysis, sputum production, shortness of breath and wheezing.   Cardiovascular: Negative for chest pain, palpitations, claudication, leg swelling and PND.  Gastrointestinal: Negative for heartburn, nausea, vomiting, abdominal pain, diarrhea, constipation, blood in stool and melena.  Genitourinary: Negative for dysuria, urgency, frequency and hematuria.  Musculoskeletal: Negative for myalgias, joint pain and falls.  Skin: Negative for itching and rash.  Neurological: Positive for headaches, dizziness, and light headedness. Negative for tingling, tremors, sensory change, speech change, focal weakness, seizures, loss of consciousness, weakness. Headaches alleviated by tylenol. Dizziness and light headedness from radiation. Endo/Heme/Allergies: Does not bruise/bleed easily.  Psychiatric/Behavioral: Negative for depression, suicidal ideas, memory loss and substance abuse. The patient is not nervous/anxious and does not have insomnia.     PHYSICAL EXAMINATION  ECOG PERFORMANCE STATUS: 1 -  Symptomatic but completely ambulatory  Filed Vitals:   10/16/14 0828  BP: 111/59  Pulse: 72  Temp: 98.7 F (37.1 C)  Resp: 16    Physical Exam  Constitutional: She is oriented to person, place, and time and well-developed, well-nourished, and in no distress.   HENT:  Head: Normocephalic and atraumatic.  Nose: Nose normal.  Mouth/Throat: Oropharynx is clear and moist. No oropharyngeal exudate.  Eyes: Conjunctivae and EOM are normal. Pupils are equal, round, and reactive to light. Right eye exhibits no discharge. Left eye exhibits no discharge. No scleral icterus.  Neck: Normal range of motion. No tracheal deviation present. No thyromegaly present.  Cardiovascular: Normal rate, regular rhythm and normal heart sounds.  Exam reveals no gallop and no friction rub.   No murmur heard. Pulmonary/Chest: Effort normal and breath sounds normal. She has no wheezes. She has no rales.  Breast: L breast abnormal palpable breast tissue, potential scar tissue. Abdominal: Soft. Bowel sounds are normal. She exhibits no distension and no mass. There is no tenderness. There is no rebound and no guarding.  Musculoskeletal: Normal range of motion. She exhibits no edema.  Lymphadenopathy:    She has no cervical adenopathy.  Neurological: She is alert and oriented to person, place, and time. She has normal reflexes. No cranial nerve deficit. Gait normal. Coordination normal.  Skin: Skin is warm and dry. No rash noted.  Psychiatric: Mood, memory, affect and judgment normal.  Nursing note and vitals reviewed.   LABORATORY DATA:  CBC    Component Value Date/Time   WBC 10.8* 10/16/2014 0848   RBC 4.26 10/16/2014 0848   HGB 13.0 10/16/2014 0848   HCT 40.3 10/16/2014 0848   PLT 188 10/16/2014 0848   MCV 94.6 10/16/2014 0848   MCH 30.5 10/16/2014 0848   MCHC 32.3 10/16/2014 0848   RDW 14.2 10/16/2014 0848   LYMPHSABS 1.3 10/16/2014 0848   MONOABS 0.8 10/16/2014 0848   EOSABS 0.0 10/16/2014 0848    BASOSABS 0.0 10/16/2014 0848   CMP     Component Value Date/Time   NA 135 10/16/2014 0848   K 3.9 10/16/2014 0848   CL 99* 10/16/2014 0848   CO2 27 10/16/2014 0848   GLUCOSE 109* 10/16/2014 0848   BUN 17 10/16/2014 0848   CREATININE 0.68 10/16/2014 0848   CALCIUM 8.8* 10/16/2014 0848   PROT 6.6 10/16/2014 0848   ALBUMIN 3.7 10/16/2014 0848   AST 21 10/16/2014 0848   ALT 30 10/16/2014 0848   ALKPHOS 44 10/16/2014 0848   BILITOT 0.4 10/16/2014 0848   GFRNONAA >60 10/16/2014 0848   GFRAA >60 10/16/2014 0848     ASSESSMENT and THERAPY PLAN:   Stage IV  ER positive, HER-2 positive carcinoma of the breast Widespread Brain Metastases End of Life issues  Tammy and I had a very frank discussion today regarding prognosis. She has already met with friends who work with hospice. She states she has made a decision that as she approaches the end-of-life she would like to die at hospice house. She notes that she and her husband built their home together and she does not want him to remember her dying in their home. She has opted to be cremated.  She is currently physically well and has made multiple plans with her family over the next several months.  We will arrange for a PET CT. If her bone and liver disease are stable I would recommend continuing Herceptin and tamoxifen. We will monitor her CNS metastases closely and if she has progression consider switching to lapatinib with or without Xeloda. I will also arrange for lumbar puncture with evaluation of her CSF for cytology. This was all discussed with her today.  She is going to discontinue her Lovenox we will switch her to Xarelto. We discussed the risk of CNS metastases and bleeding. Currently there is no contraindication for her to continue on anticoagulation.  All questions were answered. The patient knows to call the clinic with any problems, questions or concerns. We can certainly see the patient much sooner if necessary. This note  was signed electronically  This document serves as a record of services personally performed by Ancil Linsey, MD. It was created on her behalf by Arlyce Harman, a trained medical scribe. The creation of this record is based on the scribe's personal observations and the provider's statements to them. This document has been checked and approved by the attending provider.  I have reviewed the above documentation for accuracy and completeness, and I agree with the above.  Molli Hazard, MD

## 2014-10-16 NOTE — Patient Instructions (Signed)
[place] at [facility] Discharge Instructions  RECOMMENDATIONS MADE BY THE CONSULTANT AND ANY TEST RESULTS WILL BE SENT TO YOUR REFERRING PHYSICIAN.  Exam and discussion by Dr. Geryl Councilman - take as directed - see attached information PET scan to check status MUGA to check heart function Report uncontrolled nausea, vomiting or other concerns. See schedule for follow-up.  Thank you for choosing [place] at [facility] to provide your oncology and hematology care.  To afford each patient quality time with our provider, please arrive at least 15 minutes before your scheduled appointment time.    You need to re-schedule your appointment should you arrive 10 or more minutes late.  We strive to give you quality time with our providers, and arriving late affects you and other patients whose appointments are after yours.  Also, if you no show three or more times for appointments you may be dismissed from the clinic at the providers discretion.     Again, thank you for choosing [facility].  Our hope is that these requests will decrease the amount of time that you wait before being seen by our physicians.       _____________________________________________________________  Should you have questions after your visit to [facility], please contact our office at [phone] between the hours of 8:30 a.m. and 4:30 p.m.  Voicemails left after 4:30 p.m. will not be returned until the following business day.  For prescription refill requests, have your pharmacy contact our office.    Rivaroxaban oral tablets What is this medicine? RIVAROXABAN (ri va ROX a ban) is an anticoagulant (blood thinner). It is used to treat blood clots in the lungs or in the veins. It is also used after knee or hip surgeries to prevent blood clots. It is also used to lower the chance of stroke in people with a medical condition called atrial  fibrillation. This medicine may be used for other purposes; ask your health care provider or pharmacist if you have questions. COMMON BRAND NAME(S): Xarelto, Xarelto Starter Pack What should I tell my health care provider before I take this medicine? They need to know if you have any of these conditions: -bleeding disorders -bleeding in the brain -blood in your stools (black or tarry stools) or if you have blood in your vomit -history of stomach bleeding -kidney disease -liver disease -low blood counts, like low white cell, platelet, or red cell counts -recent or planned spinal or epidural procedure -take medicines that treat or prevent blood clots -an unusual or allergic reaction to rivaroxaban, other medicines, foods, dyes, or preservatives -pregnant or trying to get pregnant -breast-feeding How should I use this medicine? Take this medicine by mouth with a glass of water. Follow the directions on the prescription label. Take your medicine at regular intervals. Do not take it more often than directed. Do not stop taking except on your doctor's advice. Stopping this medicine may increase your risk of a blot clot. Be sure to refill your prescription before you run out of medicine. If you are taking this medicine after hip or knee replacement surgery, take it with or without food. If you are taking this medicine for atrial fibrillation, take it with your evening meal. If you are taking this medicine to treat blood clots, take it with food at the same time each day. If you are unable to swallow your tablet, you may crush the tablet and mix it in  applesauce. Then, immediately eat the applesauce. You should eat more food right after you eat the applesauce containing the crushed tablet. Talk to your pediatrician regarding the use of this medicine in children. Special care may be needed. Overdosage: If you think you have taken too much of this medicine contact a poison control center or emergency room  at once. NOTE: This medicine is only for you. Do not share this medicine with others. What if I miss a dose? If you take your medicine once a day and miss a dose, take the missed dose as soon as you remember. If you take your medicine twice a day and miss a dose, take the missed dose immediately. In this instance, 2 tablets may be taken at the same time. The next day you should take 1 tablet twice a day as directed. What may interact with this medicine? -aspirin and aspirin-like medicines -certain antibiotics like erythromycin, azithromycin, and clarithromycin -certain medicines for fungal infections like ketoconazole and itraconazole -certain medicines for irregular heart beat like amiodarone, quinidine, dronedarone -certain medicines for seizures like carbamazepine, phenytoin -certain medicines that treat or prevent blood clots like warfarin, enoxaparin, and dalteparin -conivaptan -diltiazem -felodipine -indinavir -lopinavir; ritonavir -NSAIDS, medicines for pain and inflammation, like ibuprofen or naproxen -ranolazine -rifampin -ritonavir -St. John's wort -verapamil This list may not describe all possible interactions. Give your health care provider a list of all the medicines, herbs, non-prescription drugs, or dietary supplements you use. Also tell them if you smoke, drink alcohol, or use illegal drugs. Some items may interact with your medicine. What should I watch for while using this medicine? Visit your doctor or health care professional for regular checks on your progress. Your condition will be monitored carefully while you are receiving this medicine. Notify your doctor or health care professional and seek emergency treatment if you develop breathing problems; changes in vision; chest pain; severe, sudden headache; pain, swelling, warmth in the leg; trouble speaking; sudden numbness or weakness of the face, arm, or leg. These can be signs that your condition has gotten worse. If  you are going to have surgery, tell your doctor or health care professional that you are taking this medicine. Tell your health care professional that you use this medicine before you have a spinal or epidural procedure. Sometimes people who take this medicine have bleeding problems around the spine when they have a spinal or epidural procedure. This bleeding is very rare. If you have a spinal or epidural procedure while on this medicine, call your health care professional immediately if you have back pain, numbness or tingling (especially in your legs and feet), muscle weakness, paralysis, or loss of bladder or bowel control. Avoid sports and activities that might cause injury while you are using this medicine. Severe falls or injuries can cause unseen bleeding. Be careful when using sharp tools or knives. Consider using an Copy. Take special care brushing or flossing your teeth. Report any injuries, bruising, or red spots on the skin to your doctor or health care professional. What side effects may I notice from receiving this medicine? Side effects that you should report to your doctor or health care professional as soon as possible: -allergic reactions like skin rash, itching or hives, swelling of the face, lips, or tongue -back pain -redness, blistering, peeling or loosening of the skin, including inside the mouth -signs and symptoms of bleeding such as bloody or black, tarry stools; red or dark-brown urine; spitting up blood or brown material that  looks like coffee grounds; red spots on the skin; unusual bruising or bleeding from the eye, gums, or nose Side effects that usually do not require medical attention (Report these to your doctor or health care professional if they continue or are bothersome.): -dizziness -muscle pain This list may not describe all possible side effects. Call your doctor for medical advice about side effects. You may report side effects to FDA at  1-800-FDA-1088. Where should I keep my medicine? Keep out of the reach of children. Store at room temperature between 15 and 30 degrees C (59 and 86 degrees F). Throw away any unused medicine after the expiration date. NOTE: This sheet is a summary. It may not cover all possible information. If you have questions about this medicine, talk to your doctor, pharmacist, or health care provider.  2015, Elsevier/Gold Standard. (2013-08-17 18:47:48)

## 2014-10-17 ENCOUNTER — Encounter (HOSPITAL_COMMUNITY): Payer: Self-pay | Admitting: Hematology & Oncology

## 2014-10-17 LAB — CANCER ANTIGEN 27.29: CA 27.29: 35.5 U/mL (ref 0.0–38.6)

## 2014-10-18 ENCOUNTER — Encounter (HOSPITAL_COMMUNITY): Payer: 59

## 2014-10-23 ENCOUNTER — Encounter (HOSPITAL_COMMUNITY): Payer: 59 | Attending: Hematology & Oncology

## 2014-10-23 VITALS — BP 120/64 | HR 75 | Temp 98.3°F | Resp 18

## 2014-10-23 DIAGNOSIS — C7951 Secondary malignant neoplasm of bone: Secondary | ICD-10-CM | POA: Diagnosis not present

## 2014-10-23 DIAGNOSIS — C50919 Malignant neoplasm of unspecified site of unspecified female breast: Secondary | ICD-10-CM | POA: Diagnosis not present

## 2014-10-23 MED ORDER — DENOSUMAB 120 MG/1.7ML ~~LOC~~ SOLN
120.0000 mg | Freq: Once | SUBCUTANEOUS | Status: AC
  Administered 2014-10-23: 120 mg via SUBCUTANEOUS
  Filled 2014-10-23: qty 1.7

## 2014-10-23 NOTE — Progress Notes (Signed)
..  [name] presents today for injection per the provider's orders.  xgeva administration without incident; see MAR for injection details.  Patient tolerated procedure well and without incident.  No questions or complaints noted at this time.

## 2014-10-25 ENCOUNTER — Other Ambulatory Visit (HOSPITAL_COMMUNITY): Payer: Self-pay | Admitting: Oncology

## 2014-10-29 ENCOUNTER — Other Ambulatory Visit (HOSPITAL_COMMUNITY): Payer: Self-pay | Admitting: Oncology

## 2014-10-29 DIAGNOSIS — T451X5A Adverse effect of antineoplastic and immunosuppressive drugs, initial encounter: Secondary | ICD-10-CM

## 2014-10-29 DIAGNOSIS — R232 Flushing: Secondary | ICD-10-CM

## 2014-10-29 MED ORDER — VENLAFAXINE HCL ER 75 MG PO CP24: 150.0000 mg | ORAL_CAPSULE | Freq: Every day | ORAL | Status: DC

## 2014-10-30 ENCOUNTER — Encounter (HOSPITAL_COMMUNITY): Payer: 59

## 2014-10-31 ENCOUNTER — Encounter (HOSPITAL_COMMUNITY): Payer: 59

## 2014-10-31 ENCOUNTER — Encounter: Payer: Self-pay | Admitting: Radiation Oncology

## 2014-10-31 ENCOUNTER — Encounter (HOSPITAL_BASED_OUTPATIENT_CLINIC_OR_DEPARTMENT_OTHER): Payer: 59 | Admitting: Hematology & Oncology

## 2014-10-31 ENCOUNTER — Encounter (HOSPITAL_COMMUNITY): Payer: Self-pay | Admitting: Hematology & Oncology

## 2014-10-31 VITALS — BP 131/56 | HR 79 | Temp 98.0°F | Resp 20 | Wt 258.4 lb

## 2014-10-31 DIAGNOSIS — R300 Dysuria: Secondary | ICD-10-CM

## 2014-10-31 DIAGNOSIS — C787 Secondary malignant neoplasm of liver and intrahepatic bile duct: Secondary | ICD-10-CM | POA: Diagnosis not present

## 2014-10-31 DIAGNOSIS — C229 Malignant neoplasm of liver, not specified as primary or secondary: Secondary | ICD-10-CM | POA: Diagnosis not present

## 2014-10-31 DIAGNOSIS — C7931 Secondary malignant neoplasm of brain: Secondary | ICD-10-CM | POA: Diagnosis not present

## 2014-10-31 DIAGNOSIS — C50812 Malignant neoplasm of overlapping sites of left female breast: Secondary | ICD-10-CM

## 2014-10-31 DIAGNOSIS — C7951 Secondary malignant neoplasm of bone: Secondary | ICD-10-CM

## 2014-10-31 DIAGNOSIS — F419 Anxiety disorder, unspecified: Secondary | ICD-10-CM

## 2014-10-31 DIAGNOSIS — R3919 Other difficulties with micturition: Secondary | ICD-10-CM

## 2014-10-31 DIAGNOSIS — C50919 Malignant neoplasm of unspecified site of unspecified female breast: Secondary | ICD-10-CM

## 2014-10-31 LAB — CBC WITH DIFFERENTIAL/PLATELET
Basophils Absolute: 0 10*3/uL (ref 0.0–0.1)
Basophils Relative: 0 % (ref 0–1)
EOS ABS: 0 10*3/uL (ref 0.0–0.7)
Eosinophils Relative: 0 % (ref 0–5)
HCT: 37.7 % (ref 36.0–46.0)
Hemoglobin: 12 g/dL (ref 12.0–15.0)
LYMPHS PCT: 12 % (ref 12–46)
Lymphs Abs: 1.2 10*3/uL (ref 0.7–4.0)
MCH: 30.6 pg (ref 26.0–34.0)
MCHC: 31.8 g/dL (ref 30.0–36.0)
MCV: 96.2 fL (ref 78.0–100.0)
Monocytes Absolute: 0.6 10*3/uL (ref 0.1–1.0)
Monocytes Relative: 6 % (ref 3–12)
NEUTROS ABS: 7.7 10*3/uL (ref 1.7–7.7)
Neutrophils Relative %: 81 % — ABNORMAL HIGH (ref 43–77)
PLATELETS: 168 10*3/uL (ref 150–400)
RBC: 3.92 MIL/uL (ref 3.87–5.11)
RDW: 15.9 % — ABNORMAL HIGH (ref 11.5–15.5)
WBC: 9.5 10*3/uL (ref 4.0–10.5)

## 2014-10-31 LAB — COMPREHENSIVE METABOLIC PANEL
ALT: 23 U/L (ref 14–54)
AST: 18 U/L (ref 15–41)
Albumin: 3.5 g/dL (ref 3.5–5.0)
Alkaline Phosphatase: 28 U/L — ABNORMAL LOW (ref 38–126)
Anion gap: 8 (ref 5–15)
BUN: 22 mg/dL — AB (ref 6–20)
CALCIUM: 8.8 mg/dL — AB (ref 8.9–10.3)
CO2: 28 mmol/L (ref 22–32)
Chloride: 101 mmol/L (ref 101–111)
Creatinine, Ser: 0.85 mg/dL (ref 0.44–1.00)
GFR calc Af Amer: >60 mL/min (ref >60)
GFR calc non Af Amer: >60 mL/min (ref >60)
GLUCOSE: 146 mg/dL — AB (ref 65–99)
Potassium: 3.7 mmol/L (ref 3.5–5.1)
SODIUM: 137 mmol/L (ref 135–145)
Total Bilirubin: 0.6 mg/dL (ref 0.3–1.2)
Total Protein: 6.1 g/dL — ABNORMAL LOW (ref 6.5–8.1)

## 2014-10-31 LAB — URINALYSIS, ROUTINE W REFLEX MICROSCOPIC
Bilirubin Urine: NEGATIVE
Glucose, UA: NEGATIVE mg/dL
HGB URINE DIPSTICK: NEGATIVE
Ketones, ur: NEGATIVE mg/dL
LEUKOCYTES UA: NEGATIVE
Nitrite: NEGATIVE
PH: 5 (ref 5.0–8.0)
PROTEIN: NEGATIVE mg/dL
Specific Gravity, Urine: 1.015 (ref 1.005–1.030)
Urobilinogen, UA: 0.2 mg/dL (ref 0.0–1.0)

## 2014-10-31 NOTE — Progress Notes (Unsigned)
Pt called and stated that she was feeling really bad x2 days.  Has had a headache, swollen, crying, drinking but not peeing, and not sleeping well.  States that she finished radiation on Friday.  Has a PET scan tomorrow and an appt on Friday. Spoke with Kirby Crigler and Dr Whitney Muse and called pt and notified her to come in for blood work.  Verbalized understanding.

## 2014-10-31 NOTE — Progress Notes (Signed)
[name], MD [place] [id number]    Breast cancer, stage 4   04/25/2014 Initial Diagnosis Breast cancer, stage 4   04/25/2014 Imaging CT abdomen/pelvia with widespread metastatic disease to the liver, multiple lytic lesions throughout spine and pelvis. No FX or epidural tumor identified   04/26/2014 Imaging CT head unremarkable   04/26/2014 Imaging CT chest with no lung mass or pulmonary nodules, no adenopathy. Lytic bone lesions, right 2nd rib   04/27/2014 Initial Biopsy U/S guided liver biopsy, lesion in anterior and inferior left hepatic lobe biopsied   04/27/2014 Pathology Results Metastatic adenocarcinoma, CK7, ER+, patchy positivity with PR. Possible primary includes breast, less likely gynecologic   05/15/2014 Mammogram BI-RADS CATEGORY  2: Benign Finding(s)   05/16/2014 PET scan 1. Intensely hypermetabolic hepatic metastasis. 2. Widespread hypermetabolic skeletal lesions. 3. No primary adenocarcinoma identified by FDG PET imaging.   05/19/2014 Imaging MUGA- Left ventricular ejection fracture greater than 70%.   05/21/2014 Breast MRI No suspicious masses or enhancement within the breasts. No axillary adenopathy.   05/22/2014 - 07/03/2014 Antibody Plan Herceptin/Perjeta/Tamoxifen   06/12/2014 - 07/03/2014 Chemotherapy Taxotere added secondary to persistent abdominal and back pain   06/17/2014 - 06/19/2014 Hospital Admission Neutropenia, fever, diarrhea, nausea, vomiting   06/20/2014 - 07/10/2014 Radiation Therapy Dr. Thea Silversmith 12 fractions to L3-S3 (30 Gy) and left scapula (20 Gy).    07/03/2014 Adverse Reaction Perjeta- induced diarrhea.  Perjeta discontinued   07/16/2014 - 07/20/2014 Hospital Admission Electrolyte abnormalities, and diarrhea.  Suspect Perjeta-induced diarrhea.  Negative GI work-up.   07/24/2014 -  Chemotherapy Herceptin/Tamoxifen/Xgeva   08/21/2014 Imaging MUGA- Left ventricular ejection fraction equals 71%.   08/24/2014 PET scan Dramatic reduction in metabolic  activity of the widespread liver metastasis. Liver metastasis now have metabolic activity equal to background normal liver activity. Liver has a nodular contour. Marked reduction in metabolic activity of skeletal lesions..   10/05/2014 Progression Widespread metastatic disease to the brain as described. Between 20 and 30 intracranial metastatic deposits are now seen. No midline shift or incipient herniation   10/09/2014 -  Radiation Therapy Whole Brain XRT    Stage IV adenocarcinoma with metastases to liver and bone  Had last mammogram in [place] this year, Dr. Gaetano Net  Colonoscopy 05/18/2013  Breast Reduction 03/17/2011  CURRENT THERAPY: Herceptin/Tamoxifen/XGEVA   INTERVAL HISTORY: [name] 49 y.o. female returns for follow-up of stage IV adenocarcinoma.   She says she "feels like crap today." She is here with her sister. She feels really swollen, tight, and that she can't breathe.She feels like she's choking. It hurts in her rib area. Last night her legs were swollen up to her knees. Bad headaches for the last 2 days. They are different from her migraines, she says it feels like pressure.She says she has been stumbling more, her sister agrees. Sometimes when she closes her eyes she falls forward a bit. Her vision has begun to blur a bit.  She has not had vomiting though she has had nausea.  She isn't using the bathroom much, she says about twice a day, although she has been drinking a lot. She is okay with trying to give a urine sample today. Will check for UTI. She has been sweating a lot, 'soaking wet all the time' then when the A/C comes on and she freezes. She hasn't been sleeping. When she does she wakes up hot. She says it may be anxiety because she is afraid she won't wake up.  She tried a pain pill before bed but it didn't help.   Her and her husband talk about her illness though sometimes he says it is too much. He is considering retiring so they can travel. She says she  is getting tired of trying to show everyone that she is okay.  Her appetite is great.  They have been tapering her steroids and cut it in half on Friday.  She has PET scan scheduled for tomorrow. She is a little nervous about her PET scan tomorrow and the possible results. She has a flight on the 8th of July to [place]. She is going to get her grand-daughter and is excited about this.    MEDICAL HISTORY: Past Medical History  Diagnosis Date  . PONV (postoperative nausea and vomiting)     pt states scope patch does well  . Anemia   . Depression   . Anxiety   . Headache(784.0)     has migraines - medication controls  . Breast cancer 04/2014    Presumed dx; stage 4 w/ mets to bone and liver  . Bony metastasis   . Family history of prostate cancer   . Hot flashes due to tamoxifen 09/25/2014    has Hyperlipidemia; Depression; Breast cancer, stage 4; Bone metastases; DVT (deep venous thrombosis); GERD (gastroesophageal reflux disease); Family history of prostate cancer; Genetic testing; and Hot flashes due to tamoxifen on her problem list.      has No Known Allergies.  Ms. Waltz does not currently have medications on file.  SURGICAL HISTORY: Past Surgical History  Procedure Laterality Date  . Right rotator cuff      2002  . Neck fusion      2003  . Laparoscopic cholecystectomy      2004  . Right knee arthroscopy      2005  . Colon surgery      2008  . Appendectomy      2008  . Abdominal hysterectomy    . Breast reduction surgery  03/17/2011    Procedure: MAMMARY REDUCTION BILATERAL (BREAST);  Surgeon: Mary A Contogiannis;  Location: [place];  Service: Plastics;  Laterality: Bilateral;  . Colonoscopy N/A 07/13/2013    Procedure: COLONOSCOPY;  Surgeon: Rogene Houston, MD;  [facility]: [facility];  Service: Endoscopy;  Laterality: N/A;  930  . Liver biopsy  04/2014  . Portacath placement    . Esophagogastroduodenoscopy N/A 05/25/2014    Procedure:  ESOPHAGOGASTRODUODENOSCOPY (EGD);  Surgeon: Rogene Houston, MD;  Location: [facility];  Service: Endoscopy;  Laterality: N/A;  155    SOCIAL HISTORY: History   Social History  . Marital Status: Married    Spouse Name: N/A  . Number of Children: N/A  . Years of Education: N/A   Occupational History  . Not on file.   Social History Main Topics  . Smoking status: Former Smoker    Types: Cigarettes    Quit date: 08/20/1994  . Smokeless tobacco: Never Used  . Alcohol Use: Yes     Comment: Occasionally  . Drug Use: No  . Sexual Activity: Yes    Birth Control/ Protection: Surgical   Other Topics Concern  . Not on file   Social History Narrative    FAMILY HISTORY: Family History  Problem Relation Age of Onset  . Diabetes Father   . Heart attack Maternal Grandmother 30    multiple over lifetime.  . Cancer Maternal Grandmother 27    NOS  .  Prostate cancer Maternal Grandfather     dx in his 93s  . Lung cancer Paternal Grandfather     dx <50  . Lymphoma Maternal Aunt     dx in her 20s  . Melanoma Cousin 63    maternal first cousin  . Brain cancer Cousin     paternal first cousin dx under 56  . Prostate cancer Other     MGF's father  . Colon cancer Other     MGM's mother  Her son lives in [place]. Her daughter lives in [place].  Review of Systems  Constitutional: Positive for hot flashes, diaphoresis, and malaise/fatigue. Negative for fever, chills, weight loss.  Hot flashes from radiation. Fatigue due to radiation treatment and poor sleeping. HENT: Positive for nosebleeds. Negative for congestion, hearing loss, sore throat and tinnitus.   Had a nosebleed yesterday. Eyes: Positive for blurred vision. Negative for double vision, pain and discharge.  Respiratory: Positive for difficulty breathing. Negative for cough, hemoptysis, sputum production, shortness of breath and wheezing.   Chest tightness. Cardiovascular: Positive for leg swelling. Negative for chest  pain, palpitations, claudication, and PND.  Gastrointestinal: Positive for nausea. Negative for heartburn, vomiting, abdominal pain, diarrhea, constipation, blood in stool and melena.  Genitourinary: Positive for lack of urination. Negative for dysuria, urgency, frequency and hematuria.  Only urinating about twice a day. Musculoskeletal: Negative for myalgias, joint pain and falls.  Skin: Negative for itching and rash.  Neurological: Positive for headaches, sensory change, and loss of balance. Negative for tingling, tremors, speech change, focal weakness, seizures, loss of consciousness, weakness. Severe headaches for the past two days. More stumbling than usual. Endo/Heme/Allergies: Does not bruise/bleed easily.  Psychiatric/Behavioral: Positive for nervous/anxiety and insomnia. Negative for depression, suicidal ideas, memory loss and substance abuse.   PHYSICAL EXAMINATION  ECOG PERFORMANCE STATUS: 1 - Symptomatic but completely ambulatory  There were no vitals filed for this visit.  Physical Exam  Constitutional: She is oriented to person, place, and time and well-developed, well-nourished, and in no distress.  She appears anxious today. She is tearful at times. HENT:  Head: Normocephalic and atraumatic.  Nose: Nose normal.  Mouth/Throat: Oropharynx is clear and moist. No oropharyngeal exudate.  Eyes: Conjunctivae and EOM are normal. Pupils are equal, round, and reactive to light. Right eye exhibits no discharge. Left eye exhibits no discharge. No scleral icterus.  Neck: Normal range of motion. No tracheal deviation present. No thyromegaly present.  Cardiovascular: Normal rate, regular rhythm and normal heart sounds.  Exam reveals no gallop and no friction rub.   No murmur heard. Pulmonary/Chest: Effort normal and breath sounds normal. She has no wheezes. She has no rales.  Abdominal: Soft. Bowel sounds are normal. She exhibits no distension and no mass. There is no tenderness. There  is no rebound and no guarding.  Musculoskeletal: Normal range of motion.  Symmetrical edema on both lower extremities. Lymphadenopathy:    She has no cervical adenopathy.  Neurological: She is alert and oriented to person, place, and time. She has normal reflexes. No cranial nerve deficit. Gait normal. Coordination normal.  Skin: Skin is warm and dry. No rash noted.  Psychiatric: Mood, memory, affect and judgment normal.  Nursing note and vitals reviewed.   LABORATORY DATA:  CBC    Component Value Date/Time   WBC 9.5 10/31/2014 1153   RBC 3.92 10/31/2014 1153   HGB 12.0 10/31/2014 1153   HCT 37.7 10/31/2014 1153   PLT 168 10/31/2014 1153   MCV  96.2 10/31/2014 1153   MCH 30.6 10/31/2014 1153   MCHC 31.8 10/31/2014 1153   RDW 15.9* 10/31/2014 1153   LYMPHSABS 1.2 10/31/2014 1153   MONOABS 0.6 10/31/2014 1153   EOSABS 0.0 10/31/2014 1153   BASOSABS 0.0 10/31/2014 1153   CMP     Component Value Date/Time   NA 137 10/31/2014 1153   K 3.7 10/31/2014 1153   CL 101 10/31/2014 1153   CO2 28 10/31/2014 1153   GLUCOSE 146* 10/31/2014 1153   BUN 22* 10/31/2014 1153   CREATININE 0.85 10/31/2014 1153   CALCIUM 8.8* 10/31/2014 1153   PROT 6.1* 10/31/2014 1153   ALBUMIN 3.5 10/31/2014 1153   AST 18 10/31/2014 1153   ALT 23 10/31/2014 1153   ALKPHOS 28* 10/31/2014 1153   BILITOT 0.6 10/31/2014 1153   GFRNONAA >60 10/31/2014 1153   GFRAA >60 10/31/2014 1153     ASSESSMENT and THERAPY PLAN:   Stage IV ER positive, HER-2 positive carcinoma of the breast Widespread Brain Metastases End of Life issues  We spent time today discussing the anxiety she is feeling. She is having trouble sleeping and admits that she is afraid of time she will not wake up. I advised her that it is very difficult for me to counsel her on how to deal with these emotions. We discussed issues related to faith. She does have Xanax at home and I have advised her to take it as needed. If her anxiety remains a  significant issue I advised her that we can change medications and refer her to counseling.  We also addressed her headaches and contacted Dr. Pablo Ledger. The patient was instructed to increase her steroid dose significantly.  She will obtain her PET scan tomorrow. If her systemic disease remains excellently controlled we will discuss how to proceed with ongoing treatment. If she has had progression we can consider switching her to [facility] therapy as a dose cross the blood-brain barrier some.  She is currently physically well and has made multiple plans with her family over the next several months.  Will have a urine sample done today and check for UTI. Will call with the results of the urine test.  Follow up next week, she has chemotherapy already scheduled for Tuesday.  All questions were answered. The patient knows to call the clinic with any problems, questions or concerns. We can certainly see the patient much sooner if necessary. This note was signed electronically  This document serves as a record of services personally performed by Ancil Linsey, MD. It was created on her behalf by Arlyce Harman, a trained medical scribe. The creation of this record is based on the scribe's personal observations and the provider's statements to them. This document has been checked and approved by the attending provider.  I have reviewed the above documentation for accuracy and completeness, and I agree with the above.  Molli Hazard, MD

## 2014-10-31 NOTE — Patient Instructions (Addendum)
[place] at [facility] Discharge Instructions  RECOMMENDATIONS MADE BY THE CONSULTANT AND ANY TEST RESULTS WILL BE SENT TO YOUR REFERRING PHYSICIAN.  Exam completed by Dr Whitney Muse today PET scan tomorrow Chemotherapy on Tuesday. Follow up as scheduled. Please call the clinic if you have any questions or concerns   Per Dr. Pablo Ledger, restart your Decadron tonight. Take 3 tablets (12mg  total) tonight. Then starting tomorrow, take 1 tablet three times a day. Per Dr. Pablo Ledger, you are having headaches because we stopped the Decadron and you can't urinate because we stopped the Decadron.  Thank you for choosing [place] at [facility] to provide your oncology and hematology care.  To afford each patient quality time with our provider, please arrive at least 15 minutes before your scheduled appointment time.    You need to re-schedule your appointment should you arrive 10 or more minutes late.  We strive to give you quality time with our providers, and arriving late affects you and other patients whose appointments are after yours.  Also, if you no show three or more times for appointments you may be dismissed from the clinic at the providers discretion.     Again, thank you for choosing [facility].  Our hope is that these requests will decrease the amount of time that you wait before being seen by our physicians.       _____________________________________________________________  Should you have questions after your visit to [facility], please contact our office at [phone] between the hours of 8:30 a.m. and 4:30 p.m.  Voicemails left after 4:30 p.m. will not be returned until the following business day.  For prescription refill requests, have your pharmacy contact our office.

## 2014-10-31 NOTE — Progress Notes (Signed)
Received Dr. Donald Pore note and Ms. Hock re: Avaree.  Recently weaned off decadron and presented to clinic today with headaches. Spoke to Arimo at [facility]. I would recommend restarting Decadron at 4 mg x 3 tonight and then 4 mg tid.  I attempted to call Ms. Peckman and both phone numbers do not have identifying statements on voicemail. I left a voice mail with our number [phone]) to call me. Hailey stated she would attempt to contact the patient as well.   SW

## 2014-11-01 ENCOUNTER — Other Ambulatory Visit (HOSPITAL_COMMUNITY): Payer: Self-pay | Admitting: Emergency Medicine

## 2014-11-01 ENCOUNTER — Ambulatory Visit (HOSPITAL_COMMUNITY)
Admission: RE | Admit: 2014-11-01 | Discharge: 2014-11-01 | Disposition: A | Discharge status: Home / Self Care | Payer: 59 | Source: Ambulatory Visit | Attending: Hematology & Oncology | Admitting: Hematology & Oncology

## 2014-11-01 DIAGNOSIS — Z9049 Acquired absence of other specified parts of digestive tract: Secondary | ICD-10-CM | POA: Insufficient documentation

## 2014-11-01 DIAGNOSIS — C7931 Secondary malignant neoplasm of brain: Secondary | ICD-10-CM | POA: Diagnosis not present

## 2014-11-01 DIAGNOSIS — C7951 Secondary malignant neoplasm of bone: Secondary | ICD-10-CM | POA: Diagnosis not present

## 2014-11-01 DIAGNOSIS — C50919 Malignant neoplasm of unspecified site of unspecified female breast: Secondary | ICD-10-CM | POA: Diagnosis not present

## 2014-11-01 LAB — GLUCOSE, CAPILLARY: Glucose-Capillary: 96 mg/dL (ref 65–99)

## 2014-11-01 MED ORDER — HALOPERIDOL 1 MG PO TABS: ORAL_TABLET | ORAL | Status: DC

## 2014-11-01 MED ORDER — FLUDEOXYGLUCOSE F - 18 (FDG) INJECTION
11.4000 | Freq: Once | INTRAVENOUS | Status: AC | PRN
  Administered 2014-11-01: 11.4 via INTRAVENOUS

## 2014-11-01 NOTE — Telephone Encounter (Signed)
Called to notify pt that urine was ok

## 2014-11-01 NOTE — Progress Notes (Signed)
Called pt to notify her of PET can results.  Pt states that she is feeling a little better and slept a few hours last night.  She says she would like something to help her sleep.  Dr Whitney Muse notified and haldol escribed to her pharmacy

## 2014-11-01 NOTE — Telephone Encounter (Signed)
-----   Message from Patrici Ranks, MD sent at 10/31/2014  5:24 PM EDT ----- Advise Urine is Ok. Dr.P

## 2014-11-02 ENCOUNTER — Ambulatory Visit (HOSPITAL_COMMUNITY): Payer: 59 | Admitting: Oncology

## 2014-11-02 ENCOUNTER — Ambulatory Visit (HOSPITAL_COMMUNITY): Payer: 59 | Admitting: Hematology & Oncology

## 2014-11-06 ENCOUNTER — Encounter (HOSPITAL_COMMUNITY): Payer: Self-pay

## 2014-11-06 ENCOUNTER — Encounter (HOSPITAL_COMMUNITY): Payer: 59

## 2014-11-06 ENCOUNTER — Other Ambulatory Visit (HOSPITAL_COMMUNITY): Payer: Self-pay | Admitting: Hematology & Oncology

## 2014-11-06 ENCOUNTER — Encounter (HOSPITAL_COMMUNITY): Payer: 59 | Attending: Hematology & Oncology

## 2014-11-06 VITALS — BP 123/72 | HR 64 | Temp 98.0°F | Resp 20 | Wt 259.0 lb

## 2014-11-06 DIAGNOSIS — C7951 Secondary malignant neoplasm of bone: Secondary | ICD-10-CM | POA: Diagnosis not present

## 2014-11-06 DIAGNOSIS — Z5112 Encounter for antineoplastic immunotherapy: Secondary | ICD-10-CM

## 2014-11-06 DIAGNOSIS — C7931 Secondary malignant neoplasm of brain: Secondary | ICD-10-CM | POA: Insufficient documentation

## 2014-11-06 DIAGNOSIS — C787 Secondary malignant neoplasm of liver and intrahepatic bile duct: Secondary | ICD-10-CM

## 2014-11-06 DIAGNOSIS — C50919 Malignant neoplasm of unspecified site of unspecified female breast: Secondary | ICD-10-CM | POA: Diagnosis present

## 2014-11-06 DIAGNOSIS — C50812 Malignant neoplasm of overlapping sites of left female breast: Secondary | ICD-10-CM

## 2014-11-06 LAB — COMPREHENSIVE METABOLIC PANEL
ALK PHOS: 29 U/L — AB (ref 38–126)
ALT: 33 U/L (ref 14–54)
AST: 19 U/L (ref 15–41)
Albumin: 3.5 g/dL (ref 3.5–5.0)
Anion gap: 10 (ref 5–15)
BILIRUBIN TOTAL: 0.7 mg/dL (ref 0.3–1.2)
BUN: 26 mg/dL — ABNORMAL HIGH (ref 6–20)
CALCIUM: 8.3 mg/dL — AB (ref 8.9–10.3)
CO2: 27 mmol/L (ref 22–32)
Chloride: 101 mmol/L (ref 101–111)
Creatinine, Ser: 0.83 mg/dL (ref 0.44–1.00)
GFR calc Af Amer: >60 mL/min (ref >60)
GFR calc non Af Amer: >60 mL/min (ref >60)
GLUCOSE: 168 mg/dL — AB (ref 65–99)
Potassium: 4.1 mmol/L (ref 3.5–5.1)
SODIUM: 138 mmol/L (ref 135–145)
TOTAL PROTEIN: 6 g/dL — AB (ref 6.5–8.1)

## 2014-11-06 LAB — CBC WITH DIFFERENTIAL/PLATELET
BASOS PCT: 0 % (ref 0–1)
Basophils Absolute: 0 10*3/uL (ref 0.0–0.1)
EOS ABS: 0 10*3/uL (ref 0.0–0.7)
Eosinophils Relative: 0 % (ref 0–5)
HEMATOCRIT: 39.5 % (ref 36.0–46.0)
HEMOGLOBIN: 12.6 g/dL (ref 12.0–15.0)
LYMPHS ABS: 0.5 10*3/uL — AB (ref 0.7–4.0)
Lymphocytes Relative: 5 % — ABNORMAL LOW (ref 12–46)
MCH: 30.7 pg (ref 26.0–34.0)
MCHC: 31.9 g/dL (ref 30.0–36.0)
MCV: 96.3 fL (ref 78.0–100.0)
MONOS PCT: 4 % (ref 3–12)
Monocytes Absolute: 0.5 10*3/uL (ref 0.1–1.0)
NEUTROS ABS: 9.7 10*3/uL — AB (ref 1.7–7.7)
NEUTROS PCT: 91 % — AB (ref 43–77)
Platelets: 167 10*3/uL (ref 150–400)
RBC: 4.1 MIL/uL (ref 3.87–5.11)
RDW: 16.1 % — ABNORMAL HIGH (ref 11.5–15.5)
WBC: 10.7 10*3/uL — ABNORMAL HIGH (ref 4.0–10.5)

## 2014-11-06 MED ORDER — TRASTUZUMAB CHEMO INJECTION 440 MG
6.0000 mg/kg | Freq: Once | INTRAVENOUS | Status: AC
  Administered 2014-11-06: 672 mg via INTRAVENOUS
  Filled 2014-11-06: qty 32

## 2014-11-06 MED ORDER — SODIUM CHLORIDE 0.9 % IJ SOLN: 10.0000 mL | INTRAMUSCULAR | Status: DC | PRN

## 2014-11-06 MED ORDER — SODIUM CHLORIDE 0.9 % IV SOLN
Freq: Once | INTRAVENOUS | Status: AC
  Administered 2014-11-06: 13:00:00 via INTRAVENOUS

## 2014-11-06 MED ORDER — HEPARIN SOD (PORK) LOCK FLUSH 100 UNIT/ML IV SOLN
500.0000 [IU] | Freq: Once | INTRAVENOUS | Status: AC | PRN
  Administered 2014-11-06: 500 [IU]
  Filled 2014-11-06: qty 5

## 2014-11-06 MED ORDER — MORPHINE SULFATE ER 30 MG PO TBCR: 30.0000 mg | EXTENDED_RELEASE_TABLET | Freq: Two times a day (BID) | ORAL | Status: DC

## 2014-11-06 MED ORDER — RIVAROXABAN 20 MG PO TABS: 20.0000 mg | ORAL_TABLET | Freq: Every day | ORAL | Status: DC

## 2014-11-06 MED ORDER — TAMOXIFEN CITRATE 20 MG PO TABS: 20.0000 mg | ORAL_TABLET | Freq: Every day | ORAL | Status: DC

## 2014-11-06 NOTE — Patient Instructions (Signed)
..  [facility] Discharge Instructions for Patients Receiving Chemotherapy  Today you received the following chemotherapy agents herceptin Pet scan looked at your body from base of skull to ankles, we will do MRI of brain at a later date to see the full response to radiation We will set you up for lumbar puncture one day next week Prescriptions given  Use antibiotic and bandaid on your heel at night. Let us know if it gets more red hot or tender than it is.  To help prevent nausea and vomiting after your treatment, we encourage you to take your nausea medication    If you develop nausea and vomiting, or diarrhea that is not controlled by your medication, call the clinic.  The clinic phone number is [phone]. Office hours are Monday-Friday 8:30am-5:00pm.  BELOW ARE SYMPTOMS THAT SHOULD BE REPORTED IMMEDIATELY:  *FEVER GREATER THAN 101.0 F  *CHILLS WITH OR WITHOUT FEVER  NAUSEA AND VOMITING THAT IS NOT CONTROLLED WITH YOUR NAUSEA MEDICATION  *UNUSUAL SHORTNESS OF BREATH  *UNUSUAL BRUISING OR BLEEDING  TENDERNESS IN MOUTH AND THROAT WITH OR WITHOUT PRESENCE OF ULCERS  *URINARY PROBLEMS  *BOWEL PROBLEMS  UNUSUAL RASH Items with * indicate a potential emergency and should be followed up as soon as possible. If you have an emergency after office hours please contact your primary care physician or go to the nearest emergency department.  Please call the clinic during office hours if you have any questions or concerns.   You may also contact the Patient Navigator at [phone] should you have any questions or need assistance in obtaining follow up care. _____________________________________________________________________ Have you asked about our STAR program?    STAR stands for Survivorship Training and Rehabilitation, and this is a nationally recognized cancer care program that focuses on survivorship and rehabilitation.  Cancer and cancer treatments may  cause problems, such as, pain, making you feel tired and keeping you from doing the things that you need or want to do. Cancer rehabilitation can help. Our goal is to reduce these troubling effects and help you have the best quality of life possible.  You may receive a survey from a nurse that asks questions about your current state of health.  Based on the survey results, all eligible patients will be referred to the [facility] program for an evaluation so we can better serve you! A frequently asked questions sheet is available upon request.

## 2014-11-06 NOTE — Progress Notes (Signed)
..  [name] presents today for chemotherapy. Discussed conditions with Dr. Whitney Muse and orders and recommendations relayed to patient.  Tolerated tx well.

## 2014-11-07 ENCOUNTER — Encounter: Payer: Self-pay | Admitting: *Deleted

## 2014-11-07 LAB — CANCER ANTIGEN 27.29: CA 27.29: 22.5 U/mL (ref 0.0–38.6)

## 2014-11-07 LAB — CANCER ANTIGEN 15-3: CANCER ANTIGEN-BREAST 15-3: 20 U/mL (ref <32)

## 2014-11-07 NOTE — Progress Notes (Signed)
[facility] Clinical Social Work  Clinical Social Work was referred by [place] rounding on 11/06/14 for assessment of psychosocial needs due to recent grief and loss concerns.  Clinical Social Worker met with patient at [facility] to offer support and assess for needs.  Pt reports her step daughter had recently expired over the weekend. This was a sudden and unexpected event. CSW provided grief support to pt and her husband, Thayer Jew. We also processed and discussed how this grief event impacted on her coping abilities and her husband's anticipatory grief of her death. CSW provided safe space for both in their grief. Pt continues to be able to discuss her end of life concerns at a high level. Pt shared she did attend yoga and group at [employer]. CSW suggested additional resources that may assist family with grief work and support. Pt and family agree to reach out as needed.    Clinical Social Work interventions: Grief and emotional support Resource education  [name], [place] Tuesdays 8:30-1pm Wednesdays 8:30-12pm  [phone]

## 2014-11-08 ENCOUNTER — Telehealth (HOSPITAL_COMMUNITY): Payer: Self-pay | Admitting: *Deleted

## 2014-11-08 ENCOUNTER — Other Ambulatory Visit (HOSPITAL_COMMUNITY): Payer: Self-pay | Admitting: Oncology

## 2014-11-08 DIAGNOSIS — R600 Localized edema: Secondary | ICD-10-CM

## 2014-11-08 MED ORDER — SPIRONOLACTONE 50 MG PO TABS: 100.0000 mg | ORAL_TABLET | Freq: Every day | ORAL | Status: DC

## 2014-11-08 NOTE — Telephone Encounter (Signed)
Patient called to clinic, spoke with Jaynee Eagles. Reports edema in both lower extremities up to her knees. Feels like behind her knees are swollen and tight. She is on her way out of town to a funeral. Wants to know if there is something we can do or something she should do? [phone]

## 2014-11-11 ENCOUNTER — Other Ambulatory Visit (HOSPITAL_COMMUNITY): Payer: Self-pay | Admitting: *Deleted

## 2014-11-11 DIAGNOSIS — R600 Localized edema: Secondary | ICD-10-CM

## 2014-11-11 MED ORDER — SPIRONOLACTONE 50 MG PO TABS: 100.0000 mg | ORAL_TABLET | Freq: Every day | ORAL | Status: DC

## 2014-11-14 ENCOUNTER — Other Ambulatory Visit (HOSPITAL_COMMUNITY): Payer: Self-pay | Admitting: Oncology

## 2014-11-14 ENCOUNTER — Encounter (HOSPITAL_COMMUNITY): Payer: 59 | Attending: Hematology & Oncology

## 2014-11-14 ENCOUNTER — Other Ambulatory Visit (HOSPITAL_COMMUNITY): Payer: Self-pay | Admitting: *Deleted

## 2014-11-14 ENCOUNTER — Encounter (HOSPITAL_COMMUNITY)
Admission: RE | Admit: 2014-11-14 | Discharge: 2014-11-14 | Disposition: A | Discharge status: Home / Self Care | Payer: 59 | Source: Ambulatory Visit | Attending: Oncology | Admitting: Oncology

## 2014-11-14 ENCOUNTER — Encounter (HOSPITAL_COMMUNITY): Payer: Self-pay

## 2014-11-14 DIAGNOSIS — R600 Localized edema: Secondary | ICD-10-CM

## 2014-11-14 DIAGNOSIS — C50812 Malignant neoplasm of overlapping sites of left female breast: Secondary | ICD-10-CM | POA: Diagnosis not present

## 2014-11-14 DIAGNOSIS — F419 Anxiety disorder, unspecified: Secondary | ICD-10-CM | POA: Insufficient documentation

## 2014-11-14 DIAGNOSIS — C50919 Malignant neoplasm of unspecified site of unspecified female breast: Secondary | ICD-10-CM

## 2014-11-14 DIAGNOSIS — C7951 Secondary malignant neoplasm of bone: Secondary | ICD-10-CM | POA: Diagnosis not present

## 2014-11-14 DIAGNOSIS — C787 Secondary malignant neoplasm of liver and intrahepatic bile duct: Secondary | ICD-10-CM | POA: Diagnosis not present

## 2014-11-14 DIAGNOSIS — C7931 Secondary malignant neoplasm of brain: Secondary | ICD-10-CM | POA: Diagnosis present

## 2014-11-14 DIAGNOSIS — Z87891 Personal history of nicotine dependence: Secondary | ICD-10-CM | POA: Insufficient documentation

## 2014-11-14 DIAGNOSIS — G4700 Insomnia, unspecified: Secondary | ICD-10-CM | POA: Insufficient documentation

## 2014-11-14 LAB — BASIC METABOLIC PANEL
ANION GAP: 10 (ref 5–15)
BUN: 33 mg/dL — ABNORMAL HIGH (ref 6–20)
CO2: 27 mmol/L (ref 22–32)
Calcium: 9.2 mg/dL (ref 8.9–10.3)
Chloride: 97 mmol/L — ABNORMAL LOW (ref 101–111)
Creatinine, Ser: 1.07 mg/dL — ABNORMAL HIGH (ref 0.44–1.00)
GFR calc Af Amer: >60 mL/min (ref >60)
GFR, EST NON AFRICAN AMERICAN: 59 mL/min — AB (ref >60)
Glucose, Bld: 153 mg/dL — ABNORMAL HIGH (ref 65–99)
Potassium: 4.7 mmol/L (ref 3.5–5.1)
SODIUM: 134 mmol/L — AB (ref 135–145)

## 2014-11-14 LAB — SAMPLE TO BLOOD BANK

## 2014-11-14 LAB — MAGNESIUM: Magnesium: 2.2 mg/dL (ref 1.7–2.4)

## 2014-11-14 MED ORDER — FUROSEMIDE 20 MG PO TABS: 20.0000 mg | ORAL_TABLET | Freq: Every day | ORAL | Status: DC

## 2014-11-14 MED ORDER — HEPARIN SOD (PORK) LOCK FLUSH 100 UNIT/ML IV SOLN
INTRAVENOUS | Status: AC
  Filled 2014-11-14: qty 5

## 2014-11-14 MED ORDER — TECHNETIUM TC 99M-LABELED RED BLOOD CELLS IV KIT
25.0000 | PACK | Freq: Once | INTRAVENOUS | Status: AC | PRN
  Administered 2014-11-14: 27 via INTRAVENOUS

## 2014-11-15 ENCOUNTER — Encounter (HOSPITAL_COMMUNITY): Payer: 59

## 2014-11-15 NOTE — Progress Notes (Signed)
Labs drawn

## 2014-11-20 ENCOUNTER — Ambulatory Visit (HOSPITAL_COMMUNITY)
Admission: RE | Admit: 2014-11-20 | Discharge: 2014-11-20 | Disposition: A | Discharge status: Home / Self Care | Payer: 59 | Source: Ambulatory Visit | Attending: Hematology & Oncology | Admitting: Hematology & Oncology

## 2014-11-20 ENCOUNTER — Telehealth (HOSPITAL_COMMUNITY): Payer: Self-pay | Admitting: *Deleted

## 2014-11-20 ENCOUNTER — Encounter (HOSPITAL_COMMUNITY): Payer: Self-pay | Admitting: *Deleted

## 2014-11-20 ENCOUNTER — Encounter (HOSPITAL_BASED_OUTPATIENT_CLINIC_OR_DEPARTMENT_OTHER): Payer: 59

## 2014-11-20 ENCOUNTER — Other Ambulatory Visit (HOSPITAL_COMMUNITY): Payer: Self-pay | Admitting: Hematology & Oncology

## 2014-11-20 VITALS — BP 133/64 | HR 84 | Temp 98.2°F | Resp 20

## 2014-11-20 DIAGNOSIS — C7931 Secondary malignant neoplasm of brain: Secondary | ICD-10-CM | POA: Insufficient documentation

## 2014-11-20 DIAGNOSIS — C50812 Malignant neoplasm of overlapping sites of left female breast: Secondary | ICD-10-CM

## 2014-11-20 DIAGNOSIS — C787 Secondary malignant neoplasm of liver and intrahepatic bile duct: Secondary | ICD-10-CM

## 2014-11-20 DIAGNOSIS — C50919 Malignant neoplasm of unspecified site of unspecified female breast: Secondary | ICD-10-CM | POA: Diagnosis not present

## 2014-11-20 DIAGNOSIS — C7951 Secondary malignant neoplasm of bone: Secondary | ICD-10-CM | POA: Diagnosis not present

## 2014-11-20 DIAGNOSIS — R110 Nausea: Secondary | ICD-10-CM | POA: Insufficient documentation

## 2014-11-20 DIAGNOSIS — R51 Headache: Secondary | ICD-10-CM | POA: Insufficient documentation

## 2014-11-20 LAB — CBC WITH DIFFERENTIAL/PLATELET
Basophils Absolute: 0 10*3/uL (ref 0.0–0.1)
Basophils Relative: 0 % (ref 0–1)
Eosinophils Absolute: 0 10*3/uL (ref 0.0–0.7)
Eosinophils Relative: 0 % (ref 0–5)
HEMATOCRIT: 35.7 % — AB (ref 36.0–46.0)
Hemoglobin: 11.8 g/dL — ABNORMAL LOW (ref 12.0–15.0)
LYMPHS ABS: 0.5 10*3/uL — AB (ref 0.7–4.0)
LYMPHS PCT: 7 % — AB (ref 12–46)
MCH: 31.6 pg (ref 26.0–34.0)
MCHC: 33.1 g/dL (ref 30.0–36.0)
MCV: 95.7 fL (ref 78.0–100.0)
Monocytes Absolute: 0.4 10*3/uL (ref 0.1–1.0)
Monocytes Relative: 6 % (ref 3–12)
Neutro Abs: 6.2 10*3/uL (ref 1.7–7.7)
Neutrophils Relative %: 87 % — ABNORMAL HIGH (ref 43–77)
PLATELETS: 104 10*3/uL — AB (ref 150–400)
RBC: 3.73 MIL/uL — ABNORMAL LOW (ref 3.87–5.11)
RDW: 16.2 % — AB (ref 11.5–15.5)
WBC: 7.1 10*3/uL (ref 4.0–10.5)

## 2014-11-20 LAB — SAMPLE TO BLOOD BANK

## 2014-11-20 MED ORDER — GADOBENATE DIMEGLUMINE 529 MG/ML IV SOLN
20.0000 mL | Freq: Once | INTRAVENOUS | Status: AC | PRN
  Administered 2014-11-20: 20 mL via INTRAVENOUS

## 2014-11-20 NOTE — Progress Notes (Signed)
LABS DRAWN

## 2014-11-20 NOTE — Progress Notes (Signed)
Labs and MRI reviewed with Dr. Whitney Muse and results conveyed to [facility]. GI referral made to Dr. Olevia Perches office for documented blood in stool. Patient had pictures of bright red blood in toilet and on toilet tissue. Patient will also call Dr. Pablo Ledger to discuss steroid dose reduction.

## 2014-11-20 NOTE — Telephone Encounter (Signed)
Patient called to report throbbing headache and passing blood in her stool. Discussed with Dr. Whitney Muse and we will get CBC and set her up for MRI of brain

## 2014-11-21 ENCOUNTER — Telehealth (HOSPITAL_COMMUNITY): Payer: Self-pay | Admitting: *Deleted

## 2014-11-21 ENCOUNTER — Telehealth (INDEPENDENT_AMBULATORY_CARE_PROVIDER_SITE_OTHER): Payer: Self-pay | Admitting: *Deleted

## 2014-11-21 ENCOUNTER — Encounter (INDEPENDENT_AMBULATORY_CARE_PROVIDER_SITE_OTHER): Payer: Self-pay | Admitting: Internal Medicine

## 2014-11-21 ENCOUNTER — Other Ambulatory Visit (INDEPENDENT_AMBULATORY_CARE_PROVIDER_SITE_OTHER): Payer: Self-pay | Admitting: *Deleted

## 2014-11-21 ENCOUNTER — Ambulatory Visit (INDEPENDENT_AMBULATORY_CARE_PROVIDER_SITE_OTHER): Payer: 59 | Admitting: Internal Medicine

## 2014-11-21 VITALS — BP 126/60 | HR 64 | Temp 97.7°F | Ht 68.0 in | Wt 255.3 lb

## 2014-11-21 DIAGNOSIS — K625 Hemorrhage of anus and rectum: Secondary | ICD-10-CM

## 2014-11-21 DIAGNOSIS — C7931 Secondary malignant neoplasm of brain: Secondary | ICD-10-CM

## 2014-11-21 DIAGNOSIS — C50919 Malignant neoplasm of unspecified site of unspecified female breast: Secondary | ICD-10-CM

## 2014-11-21 DIAGNOSIS — C799 Secondary malignant neoplasm of unspecified site: Secondary | ICD-10-CM

## 2014-11-21 MED ORDER — PEG 3350-KCL-NA BICARB-NACL 420 G PO SOLR: 4000.0000 mL | Freq: Once | ORAL | Status: DC

## 2014-11-21 NOTE — Telephone Encounter (Signed)
Ann @ Dr. Olevia Perches office called to question if [name] could hold xarelto x 2 days for colonoscopy. Discussed with Dr. Whitney Muse and she stated that yes she could hold 2 days. Return call to [facility] with response.

## 2014-11-21 NOTE — Progress Notes (Addendum)
Subjective:    Patient ID: [name], female    DOB: 1965/10/04, 49 y.o.   MRN: [id number]  HPI Here today with c/o rectal bleeding. [facility] asked our office to see her for rectal bleeding.  She shows me pictures of BRRB in the toilet. She says this am she says her stool looked black and she had BRRB in the toilet. Bloody stools x 3. She has not been on any antibiotics recently. Presently taking Xarelto for hx of blood clot in her neck. Appetite is good. She has gained weight but is on Decadron for hx of mets to the brain. Denies fever. Finished brain radiation 3 weeks ago. Her last chemotherapy was 11/06/2014 Patient has hx of metastatic breast cancer Stage 4.  Diagnosed in December. She says her disease is terminal but she is going to fight.    07/13/2013 colonoscopy Average risk:  Impression:  Examination performed to cecum. Small polyp ablated via cold biopsy from splenic flexure. External hemorrhoids.   CBC    Component Value Date/Time   WBC 7.1 11/20/2014 0929   RBC 3.73* 11/20/2014 0929   HGB 11.8* 11/20/2014 0929   HCT 35.7* 11/20/2014 0929   PLT 104* 11/20/2014 0929   MCV 95.7 11/20/2014 0929   MCH 31.6 11/20/2014 0929   MCHC 33.1 11/20/2014 0929   RDW 16.2* 11/20/2014 0929   LYMPHSABS 0.5* 11/20/2014 0929   MONOABS 0.4 11/20/2014 0929   EOSABS 0.0 11/20/2014 0929   BASOSABS 0.0 11/20/2014 0929      Review of Systems Past Medical History  Diagnosis Date  . PONV (postoperative nausea and vomiting)     pt states scope patch does well  . Anemia   . Depression   . Anxiety   . Headache(784.0)     has migraines - medication controls  . Family history of prostate cancer   . Hot flashes due to tamoxifen 09/25/2014  . Breast cancer 04/2014    Presumed dx; stage 4 w/ mets to bone and liver, brain lesions  . Bony metastasis     Past Surgical History  Procedure Laterality Date  . Right rotator cuff      2002  . Neck fusion      2003  .  Laparoscopic cholecystectomy      2004  . Right knee arthroscopy      2005  . Colon surgery      2008  . Appendectomy      2008  . Abdominal hysterectomy    . Breast reduction surgery  03/17/2011    Procedure: MAMMARY REDUCTION BILATERAL (BREAST);  Surgeon: Mary A Contogiannis;  Location: [employer] [facility];  Service: Plastics;  Laterality: Bilateral;  . Colonoscopy N/A 07/13/2013    Procedure: COLONOSCOPY;  Surgeon: Rogene Houston, MD;  [facility]: [facility];  Service: Endoscopy;  Laterality: N/A;  930  . Liver biopsy  04/2014  . Portacath placement    . Esophagogastroduodenoscopy N/A 05/25/2014    Procedure: ESOPHAGOGASTRODUODENOSCOPY (EGD);  Surgeon: Rogene Houston, MD;  Location: [facility];  Service: Endoscopy;  Laterality: N/A;  155    No Known Allergies  Current Outpatient Prescriptions on File Prior to Visit  Medication Sig Dispense Refill  . acetaminophen (TYLENOL) 325 MG tablet Take 650 mg by mouth every 6 (six) hours as needed for mild pain or fever.     . ALPRAZolam (XANAX) 0.5 MG tablet Take 0.5 mg by mouth 3 (three) times daily as needed  for anxiety. May take 1 mg at night    . Alum & Mag Hydroxide-Simeth (MAGIC MOUTHWASH) SOLN Take 10 mLs by mouth 4 (four) times daily as needed for mouth pain. 120 mL 0  . bisacodyl (DULCOLAX) 10 MG suppository Place 1 suppository (10 mg total) rectally as needed for moderate constipation. 12 suppository 0  . Calcium-Phosphorus-Vitamin D (CALCIUM/D3 ADULT GUMMIES PO) Take by mouth. Patient taking 4 gummies a day to equal her dosage of calcium 1200mg  and vitamin d 1000mg     . chlorhexidine (PERIDEX) 0.12 % solution Use as directed 15 mLs in the mouth or throat 4 (four) times daily. 120 mL 0  . dexamethasone (DECADRON) 4 MG tablet Take 1 tablet (4 mg total) by mouth 2 (two) times daily. (Patient taking differently: Take 4 mg by mouth 2 (two) times daily. 6mg  a day) 60 tablet 1  . diphenhydrAMINE (BENADRYL) 25 mg capsule Take 25 mg  by mouth every 6 (six) hours as needed for itching.    . diphenoxylate-atropine (LOMOTIL) 2.5-0.025 MG per tablet May take 1 tablet four times a day  for diarrhea. 60 tablet 1  . haloperidol (HALDOL) 1 MG tablet Take 1 to 2 tablets at night 60 tablet 1  . lidocaine (XYLOCAINE) 2 % solution Use as directed 5 mLs in the mouth or throat 4 (four) times daily as needed for mouth pain. 100 mL 0  . lidocaine-prilocaine (EMLA) cream Apply 1 application topically daily as needed (apply to port before chemo).     Marland Kitchen morphine (MS CONTIN) 30 MG 12 hr tablet Take 1 tablet (30 mg total) by mouth every 12 (twelve) hours. 60 tablet 0  . ondansetron (ZOFRAN) 8 MG tablet Take 1 tablet (8 mg total) by mouth every 8 (eight) hours as needed for nausea or vomiting. 30 tablet 1  . oxyCODONE (OXY IR/ROXICODONE) 5 MG immediate release tablet Take 1-2 tablets (5-10 mg total) by mouth every 4 (four) hours as needed. 60 tablet 0  . pantoprazole (PROTONIX) 40 MG tablet Take 1 tablet (40 mg total) by mouth daily. 30 tablet 3  . prochlorperazine (COMPAZINE) 10 MG tablet Take 1 tablet (10 mg total) by mouth every 6 (six) hours as needed for nausea or vomiting. 30 tablet 3  . rivaroxaban (XARELTO) 20 MG TABS tablet Take 1 tablet (20 mg total) by mouth daily with supper. 30 tablet 6  . SUMAtriptan (IMITREX) 100 MG tablet Take 1 tablet (100 mg total) by mouth as needed. foir migraine 10 tablet 3  . tamoxifen (NOLVADEX) 20 MG tablet Take 1 tablet (20 mg total) by mouth daily. 30 tablet 2  . venlafaxine XR (EFFEXOR XR) 75 MG 24 hr capsule Take 2 capsules (150 mg total) by mouth daily with breakfast. 60 capsule 3  . furosemide (LASIX) 20 MG tablet Take 1 tablet (20 mg total) by mouth daily. (Patient not taking: Reported on 11/21/2014) 10 tablet 0  . LORazepam (ATIVAN) 0.5 MG tablet Take 1 tablet (0.5 mg total) by mouth every 6 (six) hours as needed. anxiety/nausea (Patient not taking: Reported on 11/21/2014) 60 tablet 1  . Potassium  Bicarb-Citric Acid ([name]) 20 MEQ TBEF Take 1 tablet (20 mEq total) by mouth 2 (two) times daily. (Patient not taking: Reported on 11/21/2014) 60 each 2  . sodium chloride (OCEAN) 0.65 % SOLN nasal spray Place 1 spray into both nostrils as needed for congestion. (Patient not taking: Reported on 11/21/2014)    . spironolactone (ALDACTONE) 50 MG tablet Take 2 tablets (  100 mg total) by mouth daily. 10 tablet 0  . zolpidem (AMBIEN) 10 MG tablet Take 1 tablet (10 mg total) by mouth at bedtime as needed for sleep. 30 tablet 3   No current facility-administered medications on file prior to visit.        Objective:   Physical Exam Blood pressure 126/60, pulse 64, temperature 97.7 F (36.5 C), height 5\' 8"  (1.727 m), weight 255 lb 4.8 oz (115.803 kg). Alert and oriented. Skin warm and dry. Oral mucosa is moist.   . Sclera anicteric, conjunctivae is pink. Thyroid not enlarged. No cervical lymphadenopathy. Lungs clear. Heart regular rate and rhythm.  Abdomen is soft. Bowel sounds are positive. No hepatomegaly. No abdominal masses felt. No tenderness.  No edema to lower extremities.  Stool brown and guaiac positive.   Hemocult  Lot [id number]. Exp. Date 2016-05     Assessment & Plan:  Rectal bleeding. She has had 3 stools with BRRB. I discussed with Hx of metastatic breast cancer stage 4. Last colonoscopy in 2015 Colonic neoplasm needs to be ruled out. I discussed with Dr. Laural Golden. She is scheduled for a colonoscopy 11/26/2014.  The risks and benefits such as perforation, bleeding, and infection were reviewed with the patient and is agreeable.

## 2014-11-21 NOTE — Patient Instructions (Signed)
Colonoscopy.  The risks and benefits such as perforation, bleeding, and infection were reviewed with the patient and is agreeable. 

## 2014-11-21 NOTE — Telephone Encounter (Signed)
Patient needs trilyte 

## 2014-11-22 ENCOUNTER — Encounter (HOSPITAL_COMMUNITY): Payer: 59

## 2014-11-23 ENCOUNTER — Ambulatory Visit (HOSPITAL_COMMUNITY): Payer: 59 | Admitting: Hematology & Oncology

## 2014-11-23 ENCOUNTER — Encounter (HOSPITAL_COMMUNITY): Payer: 59

## 2014-11-25 ENCOUNTER — Encounter (INDEPENDENT_AMBULATORY_CARE_PROVIDER_SITE_OTHER): Payer: Self-pay | Admitting: Internal Medicine

## 2014-11-26 ENCOUNTER — Other Ambulatory Visit (HOSPITAL_COMMUNITY): Payer: Self-pay | Admitting: Oncology

## 2014-11-26 ENCOUNTER — Encounter (HOSPITAL_COMMUNITY)
Admission: RE | Disposition: A | Discharge status: Home Health | Payer: Self-pay | Source: Ambulatory Visit | Attending: Internal Medicine

## 2014-11-26 ENCOUNTER — Ambulatory Visit (HOSPITAL_COMMUNITY)
Admission: RE | Admit: 2014-11-26 | Discharge: 2014-11-26 | Disposition: A | Discharge status: Home Health | Payer: 59 | Source: Ambulatory Visit | Attending: Internal Medicine | Admitting: Internal Medicine

## 2014-11-26 ENCOUNTER — Encounter (HOSPITAL_COMMUNITY): Payer: Self-pay

## 2014-11-26 ENCOUNTER — Telehealth (HOSPITAL_COMMUNITY): Payer: Self-pay | Admitting: *Deleted

## 2014-11-26 DIAGNOSIS — K625 Hemorrhage of anus and rectum: Secondary | ICD-10-CM | POA: Insufficient documentation

## 2014-11-26 DIAGNOSIS — C50919 Malignant neoplasm of unspecified site of unspecified female breast: Secondary | ICD-10-CM | POA: Diagnosis not present

## 2014-11-26 DIAGNOSIS — Z7952 Long term (current) use of systemic steroids: Secondary | ICD-10-CM | POA: Diagnosis not present

## 2014-11-26 DIAGNOSIS — Z87891 Personal history of nicotine dependence: Secondary | ICD-10-CM | POA: Insufficient documentation

## 2014-11-26 DIAGNOSIS — Z853 Personal history of malignant neoplasm of breast: Secondary | ICD-10-CM | POA: Insufficient documentation

## 2014-11-26 DIAGNOSIS — F329 Major depressive disorder, single episode, unspecified: Secondary | ICD-10-CM | POA: Diagnosis not present

## 2014-11-26 DIAGNOSIS — Z923 Personal history of irradiation: Secondary | ICD-10-CM | POA: Diagnosis not present

## 2014-11-26 DIAGNOSIS — K644 Residual hemorrhoidal skin tags: Secondary | ICD-10-CM | POA: Insufficient documentation

## 2014-11-26 DIAGNOSIS — B370 Candidal stomatitis: Secondary | ICD-10-CM

## 2014-11-26 DIAGNOSIS — F419 Anxiety disorder, unspecified: Secondary | ICD-10-CM | POA: Diagnosis not present

## 2014-11-26 DIAGNOSIS — C7951 Secondary malignant neoplasm of bone: Secondary | ICD-10-CM | POA: Diagnosis not present

## 2014-11-26 DIAGNOSIS — Z79899 Other long term (current) drug therapy: Secondary | ICD-10-CM | POA: Insufficient documentation

## 2014-11-26 DIAGNOSIS — K648 Other hemorrhoids: Secondary | ICD-10-CM | POA: Diagnosis not present

## 2014-11-26 DIAGNOSIS — Z7901 Long term (current) use of anticoagulants: Secondary | ICD-10-CM | POA: Insufficient documentation

## 2014-11-26 DIAGNOSIS — C7931 Secondary malignant neoplasm of brain: Secondary | ICD-10-CM | POA: Insufficient documentation

## 2014-11-26 DIAGNOSIS — Z9119 Patient's noncompliance with other medical treatment and regimen: Secondary | ICD-10-CM | POA: Diagnosis not present

## 2014-11-26 HISTORY — PX: COLONOSCOPY: SHX5424

## 2014-11-26 SURGERY — COLONOSCOPY
Anesthesia: Moderate Sedation

## 2014-11-26 MED ORDER — MEPERIDINE HCL 50 MG/ML IJ SOLN
INTRAMUSCULAR | Status: DC | PRN
  Administered 2014-11-26 (×2): 25 mg via INTRAVENOUS

## 2014-11-26 MED ORDER — MIDAZOLAM HCL 5 MG/5ML IJ SOLN
INTRAMUSCULAR | Status: AC
  Filled 2014-11-26: qty 10

## 2014-11-26 MED ORDER — MAGIC MOUTHWASH: 10.0000 mL | Freq: Four times a day (QID) | ORAL | Status: DC | PRN

## 2014-11-26 MED ORDER — MEPERIDINE HCL 50 MG/ML IJ SOLN
INTRAMUSCULAR | Status: AC
  Filled 2014-11-26: qty 1

## 2014-11-26 MED ORDER — MIDAZOLAM HCL 5 MG/5ML IJ SOLN
INTRAMUSCULAR | Status: DC | PRN
  Administered 2014-11-26: 2 mg via INTRAVENOUS
  Administered 2014-11-26: 3 mg via INTRAVENOUS
  Administered 2014-11-26 (×2): 2 mg via INTRAVENOUS
  Administered 2014-11-26: 1 mg via INTRAVENOUS

## 2014-11-26 MED ORDER — SODIUM CHLORIDE 0.9 % IV SOLN
INTRAVENOUS | Status: DC
  Administered 2014-11-26: 07:00:00 via INTRAVENOUS

## 2014-11-26 MED ORDER — STERILE WATER FOR IRRIGATION IR SOLN
Status: DC | PRN
  Administered 2014-11-26: 07:00:00

## 2014-11-26 NOTE — Op Note (Signed)
COLONOSCOPY PROCEDURE REPORT  PATIENT:  [name]  MR#:  [id number] Birthdate:  1966/03/25, 49 y.o., female Endoscopist:  Dr. Rogene Houston, MD Referred By:  Dr. Molli Hazard, MD  Procedure Date: 11/26/2014  Procedure:   Colonoscopy.  Indications:  Patient is 49 year old Caucasian female who has history of metastatic breast carcinoma and is undergoing chemotherapy. Last week she experienced 2 episodes of rectal bleeding described to be moderate in amount. She was therefore referred for reevaluation. Patient has been on Xarelto which has been held for the procedure. Patient's last colonoscopy was in March 2015 with removal of small polyp which was sessile serrated polyp.  Informed Consent:  The procedure and risks were reviewed with the patient and informed consent was obtained.  Medications:  Demerol 50 mg IV Versed 10 mg IV  Description of procedure:  After a digital rectal exam was performed, that colonoscope was advanced from the anus through the rectum and colon to the area of splenic flexure where large amount of formed stool was noted filling the lumen. Therefore scope could not be passed any further. As the scope was withdrawn mucosal surfaces were carefully surveyed utilizing scope tip to flexion to facilitate fold flattening as needed. The scope was pulled down into the rectum where a thorough exam including retroflexion was performed.  Findings:   Poor prep resulting in incomplete examination to splenic flexure. Parts of the mucosa that seen was normal. Normal rectal mucosa. Small hemorrhoids below the dentate line.   Therapeutic/Diagnostic Maneuvers Performed:  None  Complications:  None  Cecal Withdrawal Time:  NA  Impression:  Incomplete exam secondary to poor prep. Small external hemorrhoids.  Recommendations:  Standard instructions given. Patient will resume usual medications including Xarelto. Patient will keep symptom diary as to frequency of  bleeding episodes and call us with progress report in few weeks.  Lus Kriegel U  11/26/2014 8:08 AM  CC: Dr. [name], MD & Dr. Rayne Du ref. provider found CC: Dr. Molli Hazard, MD

## 2014-11-26 NOTE — H&P (Addendum)
[name] is an 48 y.o. female.   Chief Complaint: Patient is here for colonoscopy. HPI: Patient is 49 year old Caucasian female was history of metastatic breast carcinoma and undergoing chemotherapy. Last week she had 2 episodes of rectal bleeding with a bowel movement when she passed moderate amount of bright red blood. Her hemoglobin was 11.8. She denies diarrhea or constipation. She also denies abdominal pain. She has noted lower extremity edema since she she was on Decadron for intracranial metastases. She received radiation therapy and last dose was about 3 weeks ago. Last colonoscopy was in March 2015 with removal of small polyp from splenic flexure and was serrated adenoma. Patient has been off Xarelto for 2 days.  Past Medical History  Diagnosis Date  . PONV (postoperative nausea and vomiting)     pt states scope patch does well  . Anemia   . Depression   . Anxiety   . Headache(784.0)     has migraines - medication controls  . Family history of prostate cancer   . Hot flashes due to tamoxifen 09/25/2014  . Breast cancer 04/2014    Presumed dx; stage 4 w/ mets to bone and liver, brain lesions  . Bony metastasis     Past Surgical History  Procedure Laterality Date  . Right rotator cuff      2002  . Neck fusion      2003  . Laparoscopic cholecystectomy      2004  . Right knee arthroscopy      2005  .  laparoscopic appendicectomy and small bowel resection for small bowel tumor which turned out to be lipoma in 2008         . Appendectomy      2008  . Abdominal hysterectomy    . Breast reduction surgery  03/17/2011    Procedure: MAMMARY REDUCTION BILATERAL (BREAST);  Surgeon: Mary A Contogiannis;  Location: [facility];  Service: Plastics;  Laterality: Bilateral;  . Colonoscopy N/A 07/13/2013    Procedure: COLONOSCOPY;  Surgeon: Rogene Houston, MD;  [facility];  Service: Endoscopy;  Laterality: N/A;  930  . Liver biopsy  04/2014  . Portacath  placement    . Esophagogastroduodenoscopy N/A 05/25/2014    Procedure: ESOPHAGOGASTRODUODENOSCOPY (EGD);  Surgeon: Rogene Houston, MD;  [facility]: [facility];  Service: Endoscopy;  Laterality: N/A;  155    Family History  Problem Relation Age of Onset  . Diabetes Father   . Heart attack Maternal Grandmother 30    multiple over lifetime.  . Cancer Maternal Grandmother 88    NOS  . Prostate cancer Maternal Grandfather     dx in his 68s  . Lung cancer Paternal Grandfather     dx <50  . Lymphoma Maternal Aunt     dx in her 103s  . Melanoma Cousin 33    maternal first cousin  . Brain cancer Cousin     paternal first cousin dx under 13  . Prostate cancer Other     MGF's father  . Colon cancer Other     MGM's mother   Social History:  reports that she quit smoking about 20 years ago. Her smoking use included Cigarettes. She has never used smokeless tobacco. She reports that she drinks alcohol. She reports that she does not use illicit drugs.  Allergies: No Known Allergies  Medications Prior to Admission  Medication Sig Dispense Refill  . acetaminophen (TYLENOL) 325 MG tablet Take 650 mg  by mouth every 6 (six) hours as needed for mild pain or fever.     . ALPRAZolam (XANAX) 0.5 MG tablet Take 0.5 mg by mouth 3 (three) times daily as needed for anxiety. May take 1 mg at night    . Alum & Mag Hydroxide-Simeth (MAGIC MOUTHWASH) SOLN Take 10 mLs by mouth 4 (four) times daily as needed for mouth pain. 120 mL 0  . bisacodyl (DULCOLAX) 10 MG suppository Place 1 suppository (10 mg total) rectally as needed for moderate constipation. 12 suppository 0  . Calcium-Phosphorus-Vitamin D (CALCIUM/D3 ADULT GUMMIES PO) Take by mouth. Patient taking 4 gummies a day to equal her dosage of calcium 1200mg  and vitamin d 1000mg     . chlorhexidine (PERIDEX) 0.12 % solution Use as directed 15 mLs in the mouth or throat 4 (four) times daily. 120 mL 0  . dexamethasone (DECADRON) 4 MG tablet Take 1 tablet (4 mg  total) by mouth 2 (two) times daily. (Patient taking differently: Take 4 mg by mouth 2 (two) times daily. 6mg  a day) 60 tablet 1  . diphenhydrAMINE (BENADRYL) 25 mg capsule Take 25 mg by mouth every 6 (six) hours as needed for itching.    . diphenoxylate-atropine (LOMOTIL) 2.5-0.025 MG per tablet May take 1 tablet four times a day  for diarrhea. 60 tablet 1  . haloperidol (HALDOL) 1 MG tablet Take 1 to 2 tablets at night 60 tablet 1  . lidocaine-prilocaine (EMLA) cream Apply 1 application topically daily as needed (apply to port before chemo).     Marland Kitchen morphine (MS CONTIN) 30 MG 12 hr tablet Take 1 tablet (30 mg total) by mouth every 12 (twelve) hours. 60 tablet 0  . ondansetron (ZOFRAN) 8 MG tablet Take 1 tablet (8 mg total) by mouth every 8 (eight) hours as needed for nausea or vomiting. 30 tablet 1  . oxyCODONE (OXY IR/ROXICODONE) 5 MG immediate release tablet Take 1-2 tablets (5-10 mg total) by mouth every 4 (four) hours as needed. 60 tablet 0  . pantoprazole (PROTONIX) 40 MG tablet Take 1 tablet (40 mg total) by mouth daily. 30 tablet 3  . polyethylene glycol-electrolytes (NULYTELY/GOLYTELY) 420 G solution Take 4,000 mLs by mouth once. 4000 mL 0  . prochlorperazine (COMPAZINE) 10 MG tablet Take 1 tablet (10 mg total) by mouth every 6 (six) hours as needed for nausea or vomiting. 30 tablet 3  . rivaroxaban (XARELTO) 20 MG TABS tablet Take 1 tablet (20 mg total) by mouth daily with supper. 30 tablet 6  . SUMAtriptan (IMITREX) 100 MG tablet Take 1 tablet (100 mg total) by mouth as needed. foir migraine 10 tablet 3  . tamoxifen (NOLVADEX) 20 MG tablet Take 1 tablet (20 mg total) by mouth daily. 30 tablet 2  . Trastuzumab (HERCEPTIN IV) Inject into the vein.    Marland Kitchen venlafaxine XR (EFFEXOR XR) 75 MG 24 hr capsule Take 2 capsules (150 mg total) by mouth daily with breakfast. 60 capsule 3  . zolpidem (AMBIEN) 10 MG tablet Take 10 mg by mouth at bedtime as needed for sleep.    . furosemide (LASIX) 20 MG  tablet Take 1 tablet (20 mg total) by mouth daily. (Patient not taking: Reported on 11/21/2014) 10 tablet 0  . lidocaine (XYLOCAINE) 2 % solution Use as directed 5 mLs in the mouth or throat 4 (four) times daily as needed for mouth pain. 100 mL 0  . LORazepam (ATIVAN) 0.5 MG tablet Take 1 tablet (0.5 mg total) by mouth every 6 (  six) hours as needed. anxiety/nausea (Patient not taking: Reported on 11/21/2014) 60 tablet 1  . Potassium Bicarb-Citric Acid ([name]) 20 MEQ TBEF Take 1 tablet (20 mEq total) by mouth 2 (two) times daily. (Patient not taking: Reported on 11/21/2014) 60 each 2  . sodium chloride (OCEAN) 0.65 % SOLN nasal spray Place 1 spray into both nostrils as needed for congestion. (Patient not taking: Reported on 11/21/2014)    . spironolactone (ALDACTONE) 50 MG tablet Take 2 tablets (100 mg total) by mouth daily. 10 tablet 0  . zolpidem (AMBIEN) 10 MG tablet Take 1 tablet (10 mg total) by mouth at bedtime as needed for sleep. 30 tablet 3    No results found for this or any previous visit (from the past 48 hour(s)). No results found.  ROS  Blood pressure 127/65, pulse 66, temperature 97.7 F (36.5 C), temperature source Oral, resp. rate 16, SpO2 98 %. Physical Exam  Constitutional:  Well-developed well-nourished Caucasian female with round facies.  HENT:  Scant whitish accident or soft palate(patient is being treated for oral candidiasis).  Eyes: Conjunctivae are normal. No scleral icterus.  Neck: No thyromegaly present.  Cardiovascular: Normal rate, regular rhythm and normal heart sounds.   No murmur heard. Respiratory: Effort normal and breath sounds normal.  GI:  Abdomen is symmetrical and soft without masses or splenomegaly. Liver edge is palpable right costal margin soft and nontender.  Musculoskeletal: Edema: 2-3+ pitting edema involving both legs.  Lymphadenopathy:    She has no cervical adenopathy.  Skin: Skin is warm and dry.     Assessment/Plan Rectal bleeding in  a patient with known metastatic breast CA. History of small serrated adenoma on colonoscopy of March 2015. Diagnostic colonoscopy.  REHMAN,NAJEEB U 11/26/2014, 7:32 AM

## 2014-11-26 NOTE — Discharge Instructions (Signed)
Resume usual medications including Xarelto. Resume usual diet. No driving for 24 hours. Call if bleeding recurs and is more than just blood on the tissue.  Colonoscopy, Care After Refer to this sheet in the next few weeks. These instructions provide you with information on caring for yourself after your procedure. Your health care provider may also give you more specific instructions. Your treatment has been planned according to current medical practices, but problems sometimes occur. Call your health care provider if you have any problems or questions after your procedure. WHAT TO EXPECT AFTER THE PROCEDURE  After your procedure, it is typical to have the following:  A small amount of blood in your stool.  Moderate amounts of gas and mild abdominal cramping or bloating. HOME CARE INSTRUCTIONS  Do not drive, operate machinery, or sign important documents for 24 hours.  You may shower and resume your regular physical activities, but move at a slower pace for the first 24 hours.  Take frequent rest periods for the first 24 hours.  Walk around or put a warm pack on your abdomen to help reduce abdominal cramping and bloating.  Drink enough fluids to keep your urine clear or pale yellow.  You may resume your normal diet as instructed by your health care provider. Avoid heavy or fried foods that are hard to digest.  Avoid drinking alcohol for 24 hours or as instructed by your health care provider.  Only take over-the-counter or prescription medicines as directed by your health care provider.  If a tissue sample (biopsy) was taken during your procedure:  Do not take aspirin or blood thinners for 7 days, or as instructed by your health care provider.  Do not drink alcohol for 7 days, or as instructed by your health care provider.  Eat soft foods for the first 24 hours. SEEK MEDICAL CARE IF: You have persistent spotting of blood in your stool 2-3 days after the procedure. SEEK  IMMEDIATE MEDICAL CARE IF:  You have more than a small spotting of blood in your stool.  You pass large blood clots in your stool.  Your abdomen is swollen (distended).  You have nausea or vomiting.  You have a fever.  You have increasing abdominal pain that is not relieved with medicine.

## 2014-11-27 ENCOUNTER — Encounter: Payer: Self-pay | Admitting: *Deleted

## 2014-11-27 ENCOUNTER — Encounter (HOSPITAL_COMMUNITY): Payer: Self-pay

## 2014-11-27 ENCOUNTER — Encounter (HOSPITAL_BASED_OUTPATIENT_CLINIC_OR_DEPARTMENT_OTHER): Payer: 59 | Admitting: Hematology & Oncology

## 2014-11-27 ENCOUNTER — Encounter (HOSPITAL_BASED_OUTPATIENT_CLINIC_OR_DEPARTMENT_OTHER): Payer: 59

## 2014-11-27 ENCOUNTER — Encounter (HOSPITAL_COMMUNITY): Payer: Self-pay | Admitting: Hematology & Oncology

## 2014-11-27 ENCOUNTER — Encounter (HOSPITAL_COMMUNITY): Payer: 59 | Attending: Hematology & Oncology

## 2014-11-27 VITALS — BP 122/74 | HR 60 | Temp 98.3°F

## 2014-11-27 VITALS — BP 127/70 | HR 78 | Temp 98.0°F | Resp 20 | Wt 263.3 lb

## 2014-11-27 DIAGNOSIS — C787 Secondary malignant neoplasm of liver and intrahepatic bile duct: Secondary | ICD-10-CM

## 2014-11-27 DIAGNOSIS — C7951 Secondary malignant neoplasm of bone: Secondary | ICD-10-CM

## 2014-11-27 DIAGNOSIS — F419 Anxiety disorder, unspecified: Secondary | ICD-10-CM

## 2014-11-27 DIAGNOSIS — B370 Candidal stomatitis: Secondary | ICD-10-CM

## 2014-11-27 DIAGNOSIS — C50919 Malignant neoplasm of unspecified site of unspecified female breast: Secondary | ICD-10-CM | POA: Diagnosis not present

## 2014-11-27 DIAGNOSIS — C7931 Secondary malignant neoplasm of brain: Secondary | ICD-10-CM | POA: Diagnosis not present

## 2014-11-27 DIAGNOSIS — Z5112 Encounter for antineoplastic immunotherapy: Secondary | ICD-10-CM | POA: Diagnosis not present

## 2014-11-27 DIAGNOSIS — C50812 Malignant neoplasm of overlapping sites of left female breast: Secondary | ICD-10-CM | POA: Diagnosis not present

## 2014-11-27 DIAGNOSIS — G4700 Insomnia, unspecified: Secondary | ICD-10-CM

## 2014-11-27 DIAGNOSIS — G379 Demyelinating disease of central nervous system, unspecified: Secondary | ICD-10-CM

## 2014-11-27 LAB — CBC WITH DIFFERENTIAL/PLATELET
BASOS ABS: 0 10*3/uL (ref 0.0–0.1)
Basophils Relative: 0 % (ref 0–1)
Eosinophils Absolute: 0 10*3/uL (ref 0.0–0.7)
Eosinophils Relative: 0 % (ref 0–5)
HCT: 35.8 % — ABNORMAL LOW (ref 36.0–46.0)
Hemoglobin: 11.6 g/dL — ABNORMAL LOW (ref 12.0–15.0)
Lymphocytes Relative: 10 % — ABNORMAL LOW (ref 12–46)
Lymphs Abs: 0.7 10*3/uL (ref 0.7–4.0)
MCH: 31.5 pg (ref 26.0–34.0)
MCHC: 32.4 g/dL (ref 30.0–36.0)
MCV: 97.3 fL (ref 78.0–100.0)
Monocytes Absolute: 0.4 10*3/uL (ref 0.1–1.0)
Monocytes Relative: 5 % (ref 3–12)
NEUTROS ABS: 6 10*3/uL (ref 1.7–7.7)
NEUTROS PCT: 84 % — AB (ref 43–77)
PLATELETS: 117 10*3/uL — AB (ref 150–400)
RBC: 3.68 MIL/uL — AB (ref 3.87–5.11)
RDW: 16.4 % — ABNORMAL HIGH (ref 11.5–15.5)
SMEAR REVIEW: DECREASED
WBC: 7.2 10*3/uL (ref 4.0–10.5)

## 2014-11-27 LAB — COMPREHENSIVE METABOLIC PANEL
ALT: 39 U/L (ref 14–54)
ANION GAP: 9 (ref 5–15)
AST: 18 U/L (ref 15–41)
Albumin: 3.3 g/dL — ABNORMAL LOW (ref 3.5–5.0)
Alkaline Phosphatase: 36 U/L — ABNORMAL LOW (ref 38–126)
BILIRUBIN TOTAL: 0.3 mg/dL (ref 0.3–1.2)
BUN: 17 mg/dL (ref 6–20)
CO2: 29 mmol/L (ref 22–32)
Calcium: 8.7 mg/dL — ABNORMAL LOW (ref 8.9–10.3)
Chloride: 101 mmol/L (ref 101–111)
Creatinine, Ser: 0.78 mg/dL (ref 0.44–1.00)
GFR calc non Af Amer: >60 mL/min (ref >60)
Glucose, Bld: 111 mg/dL — ABNORMAL HIGH (ref 65–99)
Potassium: 3.8 mmol/L (ref 3.5–5.1)
Sodium: 139 mmol/L (ref 135–145)
TOTAL PROTEIN: 5.8 g/dL — AB (ref 6.5–8.1)

## 2014-11-27 MED ORDER — SODIUM CHLORIDE 0.9 % IJ SOLN
10.0000 mL | INTRAMUSCULAR | Status: DC | PRN
  Administered 2014-11-27: 10 mL
  Filled 2014-11-27: qty 10

## 2014-11-27 MED ORDER — DENOSUMAB 120 MG/1.7ML ~~LOC~~ SOLN
120.0000 mg | Freq: Once | SUBCUTANEOUS | Status: AC
Start: 2014-11-27 — End: 2014-11-27
  Administered 2014-11-27: 120 mg via SUBCUTANEOUS
  Filled 2014-11-27: qty 1.7

## 2014-11-27 MED ORDER — HEPARIN SOD (PORK) LOCK FLUSH 100 UNIT/ML IV SOLN
500.0000 [IU] | Freq: Once | INTRAVENOUS | Status: AC | PRN
  Administered 2014-11-27: 500 [IU]
  Filled 2014-11-27: qty 5

## 2014-11-27 MED ORDER — TRASTUZUMAB CHEMO INJECTION 440 MG
6.0000 mg/kg | Freq: Once | INTRAVENOUS | Status: AC
  Administered 2014-11-27: 672 mg via INTRAVENOUS
  Filled 2014-11-27: qty 32

## 2014-11-27 MED ORDER — ZOLPIDEM TARTRATE ER 12.5 MG PO TBCR: 12.5000 mg | EXTENDED_RELEASE_TABLET | Freq: Every evening | ORAL | Status: DC | PRN

## 2014-11-27 MED ORDER — FLUCONAZOLE 100 MG PO TABS: 100.0000 mg | ORAL_TABLET | Freq: Every day | ORAL | Status: DC

## 2014-11-27 MED ORDER — SODIUM CHLORIDE 0.9 % IV SOLN
Freq: Once | INTRAVENOUS | Status: AC
  Administered 2014-11-27: 11:00:00 via INTRAVENOUS

## 2014-11-27 NOTE — Progress Notes (Signed)
[facility] Clinical Social Work  Clinical Social Work was referred by [place] rounding for re-assessment of psychosocial needs due to ongoing anticipatory grief and adjustment to illness.   Clinical Social Worker met with pt at length to offer support and assess for needs.  Pt has her grand daughter visiting from [place] and they are having a fun time together. Her son is coming this Friday and she is looking forward to some time with family. This has been more important to her recently. Pt continues to be a good self advocate and can express her needs and concerns. CSW and pt talked today more about how her family is reacting to her illness and possible ways to talk with family. Pt very appreciative of support today and CSW to follow and assist.   Clinical Social Work interventions: Supportive listening   [name], [place] Tuesdays 8:30-1pm Wednesdays 8:30-12pm  [phone]

## 2014-11-27 NOTE — Progress Notes (Signed)
Tolerated well

## 2014-11-27 NOTE — Progress Notes (Signed)
[name], MD [place] [address]    Breast cancer, stage 4   04/25/2014 Initial Diagnosis Breast cancer, stage 4   04/25/2014 Imaging CT abdomen/pelvia with widespread metastatic disease to the liver, multiple lytic lesions throughout spine and pelvis. No FX or epidural tumor identified   04/26/2014 Imaging CT head unremarkable   04/26/2014 Imaging CT chest with no lung mass or pulmonary nodules, no adenopathy. Lytic bone lesions, right 2nd rib   04/27/2014 Initial Biopsy U/S guided liver biopsy, lesion in anterior and inferior left hepatic lobe biopsied   04/27/2014 Pathology Results Metastatic adenocarcinoma, CK7, ER+, patchy positivity with PR. Possible primary includes breast, less likely gynecologic   05/15/2014 Mammogram BI-RADS CATEGORY  2: Benign Finding(s)   05/16/2014 PET scan 1. Intensely hypermetabolic hepatic metastasis. 2. Widespread hypermetabolic skeletal lesions. 3. No primary adenocarcinoma identified by FDG PET imaging.   05/19/2014 Imaging MUGA- Left ventricular ejection fracture greater than 70%.   05/21/2014 Breast MRI No suspicious masses or enhancement within the breasts. No axillary adenopathy.   05/22/2014 - 07/03/2014 Antibody Plan Herceptin/Perjeta/Tamoxifen   06/12/2014 - 07/03/2014 Chemotherapy Taxotere added secondary to persistent abdominal and back pain   06/17/2014 - 06/19/2014 Hospital Admission Neutropenia, fever, diarrhea, nausea, vomiting   06/20/2014 - 07/10/2014 Radiation Therapy Dr. Thea Silversmith 12 fractions to L3-S3 (30 Gy) and left scapula (20 Gy).    07/03/2014 Adverse Reaction Perjeta- induced diarrhea.  Perjeta discontinued   07/16/2014 - 07/20/2014 Hospital Admission Electrolyte abnormalities, and diarrhea.  Suspect Perjeta-induced diarrhea.  Negative GI work-up.   07/24/2014 -  Chemotherapy Herceptin/Tamoxifen/Xgeva   08/21/2014 Imaging MUGA- Left ventricular ejection fraction equals 71%.   08/24/2014 PET scan Dramatic reduction in metabolic  activity of the widespread liver metastasis. Liver metastasis now have metabolic activity equal to background normal liver activity. Liver has a nodular contour. Marked reduction in metabolic activity of skeletal lesions..   10/05/2014 Progression Widespread metastatic disease to the brain as described. Between 20 and 30 intracranial metastatic deposits are now seen. No midline shift or incipient herniation   10/09/2014 - 10/26/2014 Radiation Therapy Whole Brain XRT    Stage IV adenocarcinoma with metastases to liver and bone  Had last mammogram in [place] this year, Dr. Gaetano Net  Colonoscopy 05/18/2013  Breast Reduction 03/17/2011  CURRENT THERAPY: Herceptin/Tamoxifen/XGEVA   INTERVAL HISTORY: [name] 49 y.o. female returns for follow-up of stage IV adenocarcinoma.   She is doing fairly well today. She denies any headaches. Her blurry vision is resolving as she is tapering her sterile. She had thrush and says that [facility] mouthwash has helped some but she notices her mouth is still quite sore. The swelling of her feet from the sterile and states she cannot relate to be completely tapered off of them.  Her husband's daughter has recently suddenly died and that has been a great stress for them. She does however have her granddaughter visiting and that has been a source of [place] for her.  The patient has complaints of not being able to sleep at night.  She takes Haldol and Ambien 30 minutes before bed and feels extremely exhausted around 10 pm.  She wakes up around 2 am and is fully active doing household chores and is no longer able to sleep for the rest of the night.  She does not nap during the day.  She also currently usea a lavender diffuser.  Steroid tapering has been helping. But she notes that anxiety is a  major problem.  She takes Effexor for her anxiety and says that it helps with her hot flashes.  She also takes 2-3 Xanax daily. The patients states that her bowels are good, she  denies diarrhea.  She had a Colonoscopy yesterday but kept vomiting up the prep. She was advised she still had a lot of stool in her colon but was not felt she needed a repeat procedure. Continues to feel an abnormality in the left breast that is concerning. We examined her at her last visit.     MEDICAL HISTORY: Past Medical History  Diagnosis Date  . PONV (postoperative nausea and vomiting)     pt states scope patch does well  . Anemia   . Depression   . Anxiety   . Headache(784.0)     has migraines - medication controls  . Family history of prostate cancer   . Hot flashes due to tamoxifen 09/25/2014  . Breast cancer 04/2014    Presumed dx; stage 4 w/ mets to bone and liver, brain lesions  . Bony metastasis   . S/P small bowel resection     has Hyperlipidemia; Depression; Breast cancer, stage 4; Bone metastases; DVT (deep venous thrombosis); GERD (gastroesophageal reflux disease); Family history of prostate cancer; Genetic testing; and Hot flashes due to tamoxifen on her problem list.      has No Known Allergies.  Ms. Lai does not currently have medications on file.  SURGICAL HISTORY: Past Surgical History  Procedure Laterality Date  . Right rotator cuff      2002  . Neck fusion      2003  . Laparoscopic cholecystectomy      2004  . Right knee arthroscopy      2005  . Appendectomy      2008  . Abdominal hysterectomy    . Breast reduction surgery  03/17/2011    Procedure: MAMMARY REDUCTION BILATERAL (BREAST);  Surgeon: Mary A Contogiannis;  Location: [facility];  Service: Plastics;  Laterality: Bilateral;  . Colonoscopy N/A 07/13/2013    Procedure: COLONOSCOPY;  Surgeon: Rogene Houston, MD;  [facility];  Service: Endoscopy;  Laterality: N/A;  930  . Liver biopsy  04/2014  . Portacath placement    . Esophagogastroduodenoscopy N/A 05/25/2014    Procedure: ESOPHAGOGASTRODUODENOSCOPY (EGD);  Surgeon: Rogene Houston, MD;  [facility];  Service: Endoscopy;  Laterality: N/A;  155  . Laparoscopic appendectomy      SOCIAL HISTORY: History   Social History  . Marital Status: Married    Spouse Name: N/A  . Number of Children: N/A  . Years of Education: N/A   Occupational History  . Not on file.   Social History Main Topics  . Smoking status: Former Smoker    Types: Cigarettes    Quit date: 08/20/1994  . Smokeless tobacco: Never Used  . Alcohol Use: Yes     Comment: Occasionally  . Drug Use: No  . Sexual Activity: Yes    Birth Control/ Protection: Surgical   Other Topics Concern  . Not on file   Social History Narrative    FAMILY HISTORY: Family History  Problem Relation Age of Onset  . Diabetes Father   . Heart attack Maternal Grandmother 30    multiple over lifetime.  . Cancer Maternal Grandmother 61    NOS  . Prostate cancer Maternal Grandfather     dx in his 25s  . Lung cancer Paternal Grandfather  dx <50  . Lymphoma Maternal Aunt     dx in her 31s  . Melanoma Cousin 57    maternal first cousin  . Brain cancer Cousin     paternal first cousin dx under 20  . Prostate cancer Other     MGF's father  . Colon cancer Other     MGM's mother  Her son lives in [place]. Her daughter lives in [place].  Review of Systems  Constitutional: Positive for hot flashes, diaphoresis, and malaise/fatigue. Negative for fever, chills, weight loss.  Hot flashes from radiation. Fatigue due to radiation treatment and poor sleeping. HENT: . Negative for congestion, hearing loss, sore throat and tinnitus.   Had a nosebleed yesterday. Eyes: Positive for blurred vision. Negative for double vision, pain and discharge.  Respiratory:  Negative for cough, hemoptysis, sputum production, shortness of breath and wheezing.   Chest tightness. Cardiovascular: Positive for leg swelling. Negative for chest pain, palpitations, claudication, and PND.  Gastrointestinal:  Negative for heartburn, vomiting, abdominal  pain, diarrhea, constipation, blood in stool and melena.  Genitourinary:Negative for dysuria, urgency, frequency and hematuria.  Musculoskeletal: Negative for myalgias, joint pain and falls.  Skin: Negative for itching and rash.  Neurological: .Negative for tingling, tremors, speech change, focal weakness, seizures, loss of consciousness, weakness. Headaches or blurry vision Endo/Heme/Allergies: Does not bruise/bleed easily.  Psychiatric/Behavioral: Positive for nervous/anxiety and insomnia. Negative for depression, suicidal ideas, memory loss and substance abuse. 14 point review of systems was performed and is negative except as detailed under history of present illness and above   PHYSICAL EXAMINATION  ECOG PERFORMANCE STATUS: 1 - Symptomatic but completely ambulatory  Filed Vitals:   11/27/14 0900  BP: 127/70  Pulse: 78  Temp: 98 F (36.7 C)  Resp: 20    Physical Exam  Constitutional: She is oriented to person, place, and time and well-developed, well-nourished, and in no distress. Anxious.  Facial Cushingoid features  HENT:  Head: Normocephalic and atraumatic.  Nose: Nose normal.  Mouth/Throat:  Several white lesions noted c/w oral thrush Eyes: Conjunctivae and EOM are normal. Pupils are equal, round, and reactive to light. Right eye exhibits no discharge. Left eye exhibits no discharge. No scleral icterus.  Neck: Normal range of motion. No tracheal deviation present. No thyromegaly present.  Cardiovascular: Normal rate, regular rhythm and normal heart sounds.  Exam reveals no gallop and no friction rub.   No murmur heard. Pitting edema up to mid calf  Pulmonary/Chest: Effort normal and breath sounds normal. She has no wheezes. She has no rales.  Abdominal: Soft. Bowel sounds are normal. She exhibits no distension and no mass. There is no tenderness. There is no rebound and no guarding.  Musculoskeletal: Normal range of motion.  Symmetrical edema on both lower  extremities. Lymphadenopathy:    She has no cervical adenopathy.  Neurological: She is alert and oriented to person, place, and time. She has normal reflexes. No cranial nerve deficit. Gait normal. Coordination normal.  Skin: Skin is warm and dry. No rash noted.  Psychiatric: Mood, memory, affect and judgment normal.  Nursing note and vitals reviewed. Breast: Left breast exam notes an area of palpable firmness at approximately the 11:00 position close to the nipple  LABORATORY DATA:  CBC    Component Value Date/Time   WBC 7.2 11/27/2014 0935   RBC 3.68* 11/27/2014 0935   HGB 11.6* 11/27/2014 0935   HCT 35.8* 11/27/2014 0935   PLT 117* 11/27/2014 0935   MCV 97.3 11/27/2014  0935   MCH 31.5 11/27/2014 0935   MCHC 32.4 11/27/2014 0935   RDW 16.4* 11/27/2014 0935   LYMPHSABS 0.7 11/27/2014 0935   MONOABS 0.4 11/27/2014 0935   EOSABS 0.0 11/27/2014 0935   BASOSABS 0.0 11/27/2014 0935   CMP     Component Value Date/Time   NA 139 11/27/2014 0935   K 3.8 11/27/2014 0935   CL 101 11/27/2014 0935   CO2 29 11/27/2014 0935   GLUCOSE 111* 11/27/2014 0935   BUN 17 11/27/2014 0935   CREATININE 0.78 11/27/2014 0935   CALCIUM 8.7* 11/27/2014 0935   PROT 5.8* 11/27/2014 0935   ALBUMIN 3.3* 11/27/2014 0935   AST 18 11/27/2014 0935   ALT 39 11/27/2014 0935   ALKPHOS 36* 11/27/2014 0935   BILITOT 0.3 11/27/2014 0935   GFRNONAA >60 11/27/2014 0935   GFRAA >60 11/27/2014 0935   RADIOLOGY:  CLINICAL DATA: Subsequent treatment strategy for breast cancer with brain metastases. Restaging examination.  EXAM: NUCLEAR MEDICINE PET SKULL BASE TO THIGH  TECHNIQUE: 11.4 mCi F-18 FDG was injected intravenously. Full-ring PET imaging was performed from the skull base to thigh after the radiotracer. CT data was obtained and used for attenuation correction and anatomic localization.  FASTING BLOOD GLUCOSE: Value: 96 mg/dl  COMPARISON: PET-CT 08/24/2014, as well as multiple other  prior examinations.  FINDINGS: NECK  No hypermetabolic lymph nodes in the neck.  CHEST  No hypermetabolic mediastinal or hilar nodes. No suspicious pulmonary nodules on the CT scan. Right-sided single-lumen internal jugular Port-A-Cath with tip terminating at the superior cavoatrial junction. Heart size is normal. There is no significant pericardial fluid, thickening or pericardial calcification. No acute consolidative airspace disease. No pleural effusions.  ABDOMEN/PELVIS  No abnormal hypermetabolic activity within the liver, pancreas, adrenal glands, or spleen. No hypermetabolic lymph nodes in the abdomen or pelvis. The liver again has a very nodular contour, and diffuse parenchymal abnormalities, presumably treated metastases. Heterogeneous metabolic activity throughout the liver without discrete hypermetabolic lesion on today's examination. The largest discrete lesion by CT imaging is in segment 7 (image 90 of series 4) measuring 3.2 x 3.7 cm, similar to the prior study, presumably a treated metastasis. Status post cholecystectomy.  SKELETON  While there is no focal hypermetabolic activity in the skeleton on today's examination, there are again innumerable sclerotic lesions throughout the visualized axial and appendicular skeleton, compatible with widespread treated skeletal metastasis. Healing fracture of the neck of the acromion process in the left scapula is noted.  IMPRESSION: 1. Continued positive response to therapy. All hypermetabolism previously noted in hepatic and skeletal metastasis has resolved. No new hypermetabolic metastatic lesions identified in the neck, chest, abdomen or pelvis. Innumerable treated skeletal metastases are redemonstrated. The very nodular contour of the liver and diffuse irregularity of the hepatic parenchyma is similar to the prior examination, as discussed above, related to innumerable treated metastases. 2. Additional  incidental findings, as above.   Electronically Signed  By: Vinnie Langton M.D.  On: 11/01/2014 11:04    ASSESSMENT and THERAPY PLAN:   Stage IV ER positive, HER-2 positive carcinoma of the breast Widespread Brain Metastases End of Life issues Insomnia Anxiety  She will continue on Herceptin every 3 weeks and tamoxifen daily. Her disease systemically is well controlled. Recent MRI has shown some improvement in the brain metastases. We again addressed her anxiety and she thinks she is doing better, but has a hard time anticipating her own death. She does have a trip to [place] planned with  her daughter and son and is looking forward to that.  I have called in a prescription for Diflucan given her persistent thrush. She should be off of the dexamethasone within the next 7-10 days.  I have advised her to stop her Ambien and have given her a prescription for Ambien CR. I have also instructed the patient to start melatonin, 6 mg nightly. If she is still having difficulty sleeping I have advised her to let us know in the next several days.  In her concerns about her left breast we will order a diagnostic mammogram. I will see her back again in 3 weeks prior to her next chemotherapy.  All questions were answered. The patient knows to call the clinic with any problems, questions or concerns. We can certainly see the patient much sooner if necessary.   This note was signed electronically  This document serves as a record of services personally performed by Ancil Linsey, MD. It was created on her behalf by Janace Hoard, a trained medical scribe. The creation of this record is based on the scribe's personal observations and the provider's statements to them. This document has been checked and approved by the attending provider.  I have reviewed the above documentation for accuracy and completeness, and I agree with the above.  Kelby Fam. Whitney Muse, MD

## 2014-11-27 NOTE — Patient Instructions (Signed)
[place] at [facility] Discharge Instructions  RECOMMENDATIONS MADE BY THE CONSULTANT AND ANY TEST RESULTS WILL BE SENT TO YOUR REFERRING PHYSICIAN.  Exam completed by Dr Whitney Muse today Mammogram scheduled  Chemotherapy today as scheduled X-geva today if calcium level is good. Return in 3 weeks for chemotherapy and to see the doctor. Please call the clinic if you have any questions or concerns   Thank you for choosing [place] at [facility] to provide your oncology and hematology care.  To afford each patient quality time with our provider, please arrive at least 15 minutes before your scheduled appointment time.    You need to re-schedule your appointment should you arrive 10 or more minutes late.  We strive to give you quality time with our providers, and arriving late affects you and other patients whose appointments are after yours.  Also, if you no show three or more times for appointments you may be dismissed from the clinic at the providers discretion.     Again, thank you for choosing [facility].  Our hope is that these requests will decrease the amount of time that you wait before being seen by our physicians.       _____________________________________________________________  Should you have questions after your visit to [facility], please contact our office at [phone] between the hours of 8:30 a.m. and 4:30 p.m.  Voicemails left after 4:30 p.m. will not be returned until the following business day.  For prescription refill requests, have your pharmacy contact our office.

## 2014-11-27 NOTE — Progress Notes (Signed)
..  [name] presents today for injection per the provider's orders.  xgeva administration without incident; see MAR for injection details.  Patient tolerated procedure well and without incident.  No questions or complaints noted at this time.

## 2014-11-28 ENCOUNTER — Encounter (HOSPITAL_COMMUNITY): Payer: Self-pay | Admitting: Internal Medicine

## 2014-11-28 LAB — CANCER ANTIGEN 27.29: CA 27.29: 21.8 U/mL (ref 0.0–38.6)

## 2014-11-29 ENCOUNTER — Encounter (HOSPITAL_COMMUNITY): Payer: Self-pay | Admitting: *Deleted

## 2014-11-29 ENCOUNTER — Encounter (HOSPITAL_COMMUNITY): Payer: 59

## 2014-11-29 ENCOUNTER — Other Ambulatory Visit: Payer: Self-pay

## 2014-11-29 ENCOUNTER — Emergency Department (HOSPITAL_COMMUNITY)
Admission: EM | Admit: 2014-11-29 | Discharge: 2014-11-30 | Disposition: A | Discharge status: Home / Self Care | Payer: 59 | Attending: Emergency Medicine | Admitting: Emergency Medicine

## 2014-11-29 ENCOUNTER — Emergency Department (HOSPITAL_COMMUNITY): Payer: 59

## 2014-11-29 DIAGNOSIS — R600 Localized edema: Secondary | ICD-10-CM | POA: Diagnosis not present

## 2014-11-29 DIAGNOSIS — R609 Edema, unspecified: Secondary | ICD-10-CM | POA: Insufficient documentation

## 2014-11-29 DIAGNOSIS — R0602 Shortness of breath: Secondary | ICD-10-CM | POA: Diagnosis present

## 2014-11-29 DIAGNOSIS — Z853 Personal history of malignant neoplasm of breast: Secondary | ICD-10-CM | POA: Insufficient documentation

## 2014-11-29 DIAGNOSIS — F329 Major depressive disorder, single episode, unspecified: Secondary | ICD-10-CM | POA: Diagnosis not present

## 2014-11-29 DIAGNOSIS — C7951 Secondary malignant neoplasm of bone: Secondary | ICD-10-CM | POA: Diagnosis not present

## 2014-11-29 DIAGNOSIS — F419 Anxiety disorder, unspecified: Secondary | ICD-10-CM | POA: Diagnosis not present

## 2014-11-29 DIAGNOSIS — R079 Chest pain, unspecified: Secondary | ICD-10-CM | POA: Insufficient documentation

## 2014-11-29 DIAGNOSIS — Z87891 Personal history of nicotine dependence: Secondary | ICD-10-CM | POA: Diagnosis not present

## 2014-11-29 DIAGNOSIS — Z862 Personal history of diseases of the blood and blood-forming organs and certain disorders involving the immune mechanism: Secondary | ICD-10-CM | POA: Insufficient documentation

## 2014-11-29 DIAGNOSIS — C7931 Secondary malignant neoplasm of brain: Secondary | ICD-10-CM | POA: Diagnosis not present

## 2014-11-29 DIAGNOSIS — Z86718 Personal history of other venous thrombosis and embolism: Secondary | ICD-10-CM | POA: Diagnosis not present

## 2014-11-29 DIAGNOSIS — M542 Cervicalgia: Secondary | ICD-10-CM | POA: Diagnosis not present

## 2014-11-29 DIAGNOSIS — Z7952 Long term (current) use of systemic steroids: Secondary | ICD-10-CM | POA: Diagnosis not present

## 2014-11-29 DIAGNOSIS — C787 Secondary malignant neoplasm of liver and intrahepatic bile duct: Secondary | ICD-10-CM | POA: Insufficient documentation

## 2014-11-29 DIAGNOSIS — Z79899 Other long term (current) drug therapy: Secondary | ICD-10-CM | POA: Diagnosis not present

## 2014-11-29 LAB — TROPONIN I: Troponin I: <0.03 ng/mL (ref <0.031)

## 2014-11-29 LAB — BASIC METABOLIC PANEL
Anion gap: 9 (ref 5–15)
BUN: 17 mg/dL (ref 6–20)
CALCIUM: 8.9 mg/dL (ref 8.9–10.3)
CHLORIDE: 98 mmol/L — AB (ref 101–111)
CO2: 31 mmol/L (ref 22–32)
CREATININE: 0.78 mg/dL (ref 0.44–1.00)
GFR calc Af Amer: >60 mL/min (ref >60)
GLUCOSE: 126 mg/dL — AB (ref 65–99)
POTASSIUM: 3.7 mmol/L (ref 3.5–5.1)
Sodium: 138 mmol/L (ref 135–145)

## 2014-11-29 MED ORDER — IOHEXOL 350 MG/ML SOLN: 100.0000 mL | Freq: Once | INTRAVENOUS | Status: AC | PRN

## 2014-11-29 NOTE — ED Notes (Signed)
Pt c/o sob, swelling that started in lower extremities after chemo this past Tuesday, chest aching that radiates to jaw area.

## 2014-11-29 NOTE — ED Provider Notes (Signed)
CSN: [id number]     Arrival date & time 11/29/14  2216 History  This chart was scribed for Noemi Chapel, MD by Starleen Arms, ED Scribe. This patient was seen in room APA01/APA01 and the patient's care was started at 10:38 PM.   Chief Complaint  Patient presents with  . Shortness of Breath   The history is provided by the patient. No language interpreter was used.   HPI Comments: [name] is a 49 y.o. female who presents to the Emergency Department complaining of sudden SOB onset 2 hours ago.  The patient was sitting upright in a recliner when she began to feel tightness in the chest that migrated to the throat.  Chest rated 9/10 at onset, 7/10 currently.  She also reports increasing fatigue over the past two days after her chemotherapy and slight right-sided neck swelling today. There is a history of DVT several months ago in her right jugular on 20 mg Xarelto currently for (1week).   The patient has stage IV breast cancer, liver, bone and brain metastisis.  She has not had surgical intervention but has undergone full brain radiation and chemotherapy starting 2/22 through a right chest port.  She is on prednisone for brain metastasis and diuretics for swelling due to prednisone.  She reports history of hysterectomy but denies DM, HTN, HLD, cardiac conditions.  She denies vomiting, diarrhea.   Past Medical History  Diagnosis Date  . PONV (postoperative nausea and vomiting)     pt states scope patch does well  . Anemia   . Depression   . Anxiety   . Headache(784.0)     has migraines - medication controls  . Family history of prostate cancer   . Hot flashes due to tamoxifen 09/25/2014  . Breast cancer 04/2014    Presumed dx; stage 4 w/ mets to bone and liver, brain lesions  . Bony metastasis   . S/P small bowel resection    Past Surgical History  Procedure Laterality Date  . Right rotator cuff      2002  . Neck fusion      2003  . Laparoscopic cholecystectomy      2004  . Right  knee arthroscopy      2005  . Appendectomy      2008  . Abdominal hysterectomy    . Breast reduction surgery  03/17/2011    Procedure: MAMMARY REDUCTION BILATERAL (BREAST);  Surgeon: Mary A Contogiannis;  Location: [facility];  Service: Plastics;  Laterality: Bilateral;  . Colonoscopy N/A 07/13/2013    Procedure: COLONOSCOPY;  Surgeon: Rogene Houston, MD;  [facility];  Service: Endoscopy;  Laterality: N/A;  930  . Liver biopsy  04/2014  . Portacath placement    . Esophagogastroduodenoscopy N/A 05/25/2014    Procedure: ESOPHAGOGASTRODUODENOSCOPY (EGD);  Surgeon: Rogene Houston, MD;  Location: [facility];  Service: Endoscopy;  Laterality: N/A;  155  . Laparoscopic appendectomy    . Colonoscopy N/A 11/26/2014    Procedure: COLONOSCOPY;  Surgeon: Rogene Houston, MD;  Location: [facility];  Service: Endoscopy;  Laterality: N/A;  730   Family History  Problem Relation Age of Onset  . Diabetes Father   . Heart attack Maternal Grandmother 30    multiple over lifetime.  . Cancer Maternal Grandmother 72    NOS  . Prostate cancer Maternal Grandfather     dx in his 67s  . Lung cancer Paternal Grandfather  dx <50  . Lymphoma Maternal Aunt     dx in her 59s  . Melanoma Cousin 15    maternal first cousin  . Brain cancer Cousin     paternal first cousin dx under 43  . Prostate cancer Other     MGF's father  . Colon cancer Other     MGM's mother   History  Substance Use Topics  . Smoking status: Former Smoker    Types: Cigarettes    Quit date: 08/20/1994  . Smokeless tobacco: Never Used  . Alcohol Use: Yes     Comment: Occasionally   OB History    No data available     Review of Systems  Respiratory: Positive for shortness of breath.   Cardiovascular: Positive for chest pain and leg swelling.  Musculoskeletal: Positive for neck pain.  All other systems reviewed and are negative.     Allergies  Review of patient's allergies indicates no  known allergies.  Home Medications   Prior to Admission medications   Medication Sig Start Date End Date Taking? Authorizing Provider  Calcium-Phosphorus-Vitamin D (CALCIUM/D3 ADULT GUMMIES PO) Take by mouth. Patient taking 4 gummies a day to equal her dosage of calcium 1200mg  and vitamin d 1000mg    Yes Historical Provider, MD  dexamethasone (DECADRON) 4 MG tablet Take 1 tablet (4 mg total) by mouth 2 (two) times daily. Patient taking differently: Take 4 mg by mouth 2 (two) times daily. 6mg  a day 10/05/14  Yes Manon Hilding Kefalas, PA-C  fluconazole (DIFLUCAN) 100 MG tablet Take 1 tablet (100 mg total) by mouth daily. 11/27/14  Yes Patrici Ranks, MD  haloperidol (HALDOL) 1 MG tablet Take 1 to 2 tablets at night Patient taking differently: Take 1-2 mg by mouth at bedtime.  11/01/14  Yes Patrici Ranks, MD  morphine (MS CONTIN) 30 MG 12 hr tablet Take 1 tablet (30 mg total) by mouth every 12 (twelve) hours. 11/06/14  Yes Patrici Ranks, MD  pantoprazole (PROTONIX) 40 MG tablet Take 1 tablet (40 mg total) by mouth daily. 09/25/14  Yes Baird Cancer, PA-C  rivaroxaban (XARELTO) 20 MG TABS tablet Take 1 tablet (20 mg total) by mouth daily with supper. 11/06/14  Yes Patrici Ranks, MD  tamoxifen (NOLVADEX) 20 MG tablet Take 1 tablet (20 mg total) by mouth daily. 11/06/14  Yes Patrici Ranks, MD  venlafaxine XR (EFFEXOR XR) 75 MG 24 hr capsule Take 2 capsules (150 mg total) by mouth daily with breakfast. 10/29/14  Yes Baird Cancer, PA-C  zolpidem (AMBIEN CR) 12.5 MG CR tablet Take 1 tablet (12.5 mg total) by mouth at bedtime as needed for sleep. 11/27/14  Yes Patrici Ranks, MD  zolpidem (AMBIEN) 10 MG tablet Take 10 mg by mouth at bedtime. 10/25/14  Yes Historical Provider, MD  acetaminophen (TYLENOL) 325 MG tablet Take 650 mg by mouth every 6 (six) hours as needed for mild pain or fever.     Historical Provider, MD  ALPRAZolam Duanne Moron) 0.5 MG tablet Take 0.5 mg by mouth 3 (three) times  daily as needed for anxiety. May take 1 mg at night    Historical Provider, MD  Alum & Mag Hydroxide-Simeth (MAGIC MOUTHWASH) SOLN Take 10 mLs by mouth 4 (four) times daily as needed for mouth pain. 11/26/14   Baird Cancer, PA-C  bisacodyl (DULCOLAX) 10 MG suppository Place 1 suppository (10 mg total) rectally as needed for moderate constipation. Patient not taking: Reported on 11/27/2014  07/20/14   Kathie Dike, MD  chlorhexidine (PERIDEX) 0.12 % solution Use as directed 15 mLs in the mouth or throat 4 (four) times daily. Patient not taking: Reported on 11/27/2014 06/19/14   Rexene Alberts, MD  diphenhydrAMINE (BENADRYL) 25 mg capsule Take 25 mg by mouth every 6 (six) hours as needed for itching.    Historical Provider, MD  diphenoxylate-atropine (LOMOTIL) 2.5-0.025 MG per tablet May take 1 tablet four times a day  for diarrhea. 07/20/14   Kathie Dike, MD  lidocaine-prilocaine (EMLA) cream Apply 1 application topically daily as needed (apply to port before chemo).  05/17/14   Historical Provider, MD  ondansetron (ZOFRAN) 8 MG tablet Take 1 tablet (8 mg total) by mouth every 8 (eight) hours as needed for nausea or vomiting. 08/14/14   Patrici Ranks, MD  oxyCODONE (OXY IR/ROXICODONE) 5 MG immediate release tablet Take 1-2 tablets (5-10 mg total) by mouth every 4 (four) hours as needed. Patient taking differently: Take 5-10 mg by mouth every 4 (four) hours as needed for moderate pain.  09/14/14   Baird Cancer, PA-C  prochlorperazine (COMPAZINE) 10 MG tablet Take 1 tablet (10 mg total) by mouth every 6 (six) hours as needed for nausea or vomiting. Patient not taking: Reported on 11/27/2014 07/05/14   Patrici Ranks, MD  SUMAtriptan (IMITREX) 100 MG tablet Take 1 tablet (100 mg total) by mouth as needed. foir migraine 07/03/14   Patrici Ranks, MD  Trastuzumab (HERCEPTIN IV) Inject into the vein.    Historical Provider, MD   BP 151/87 mmHg  Pulse 79  Temp(Src) 98.7 F (37.1 C) (Oral)  Resp 20   SpO2 97% Physical Exam  Constitutional: She appears well-developed and well-nourished. No distress.  HENT:  Head: Normocephalic and atraumatic.  Mouth/Throat: Oropharynx is clear and moist. No oropharyngeal exudate.  Eyes: Conjunctivae and EOM are normal. Pupils are equal, round, and reactive to light. Right eye exhibits no discharge. Left eye exhibits no discharge. No scleral icterus.  Neck: Normal range of motion. Neck supple. No JVD present. No thyromegaly present.  Cardiovascular: Normal rate, regular rhythm, normal heart sounds and intact distal pulses.  Exam reveals no gallop and no friction rub.   No murmur heard. Pulmonary/Chest: Effort normal and breath sounds normal. No respiratory distress. She has no wheezes. She has no rales.  Abdominal: Soft. Bowel sounds are normal. She exhibits no distension and no mass. There is no tenderness.  Musculoskeletal: Normal range of motion. She exhibits no edema or tenderness.  Bilateral pitting edema to both legs.   Lymphadenopathy:    She has no cervical adenopathy.  Neurological: She is alert. Coordination normal.  Skin: Skin is warm and dry. No rash noted. No erythema.  Psychiatric: She has a normal mood and affect. Her behavior is normal.  Nursing note and vitals reviewed.   ED Course  Procedures (including critical care time)  DIAGNOSTIC STUDIES: Oxygen Saturation is 97% on RA, normal by my interpretation.    COORDINATION OF CARE:  10:48 PM Discussed treatment plan with patient at bedside.  Patient acknowledges and agrees with plan.    Labs Review Labs Reviewed  BASIC METABOLIC PANEL - Abnormal; Notable for the following:    Chloride 98 (*)    Glucose, Bld 126 (*)    All other components within normal limits  TROPONIN I  CBC WITH DIFFERENTIAL/PLATELET    Imaging Review No results found.  ED ECG REPORT  I personally interpreted this EKG  Date: 11/30/2014   Rate: 68  Rhythm: normal sinus rhythm  QRS Axis: normal   Intervals: normal  ST/T Wave abnormalities: normal  Conduction Disutrbances:none  Narrative Interpretation:   Old EKG Reviewed: unchanged c/w 10/05/14   MDM   Final diagnoses:  Chest pain   The patient has no tachycardia, no fever, no hypotension, no hypoxia. She has at increased risk for pulmonary embolism given her known jugular DVT, she is already anticoagulated however if she has existing clot she could've thrown a pulmonary embolism. CT angiogram is been ordered, labs ordered, EKG normal.  Change of shift - care signed over to Dr. Tomi Bamberger pending CT results and reevaluation.  I personally performed the services described in this documentation, which was scribed in my presence. The recorded information has been reviewed and is accurate.     Noemi Chapel, MD 11/30/14 0000

## 2014-11-30 LAB — CBC WITH DIFFERENTIAL/PLATELET
Basophils Absolute: 0 10*3/uL (ref 0.0–0.1)
Basophils Relative: 0 % (ref 0–1)
Eosinophils Absolute: 0 10*3/uL (ref 0.0–0.7)
Eosinophils Relative: 0 % (ref 0–5)
HEMATOCRIT: 35.6 % — AB (ref 36.0–46.0)
HEMOGLOBIN: 11.7 g/dL — AB (ref 12.0–15.0)
LYMPHS PCT: 7 % — AB (ref 12–46)
Lymphs Abs: 0.6 10*3/uL — ABNORMAL LOW (ref 0.7–4.0)
MCH: 31.9 pg (ref 26.0–34.0)
MCHC: 32.9 g/dL (ref 30.0–36.0)
MCV: 97 fL (ref 78.0–100.0)
MONO ABS: 0.5 10*3/uL (ref 0.1–1.0)
Monocytes Relative: 6 % (ref 3–12)
NEUTROS PCT: 87 % — AB (ref 43–77)
Neutro Abs: 7.1 10*3/uL (ref 1.7–7.7)
Platelets: 129 10*3/uL — ABNORMAL LOW (ref 150–400)
RBC: 3.67 MIL/uL — AB (ref 3.87–5.11)
RDW: 16.2 % — AB (ref 11.5–15.5)
WBC Morphology: INCREASED
WBC: 8.1 10*3/uL (ref 4.0–10.5)

## 2014-11-30 MED ORDER — FUROSEMIDE 10 MG/ML IJ SOLN
INTRAMUSCULAR | Status: AC
  Filled 2014-11-30: qty 8

## 2014-11-30 MED ORDER — FUROSEMIDE 40 MG PO TABS: ORAL_TABLET | ORAL | Status: DC

## 2014-11-30 MED ORDER — FUROSEMIDE 10 MG/ML IJ SOLN
60.0000 mg | Freq: Once | INTRAMUSCULAR | Status: AC
  Administered 2014-11-30: 60 mg via INTRAVENOUS

## 2014-11-30 MED ORDER — IOHEXOL 350 MG/ML SOLN
100.0000 mL | Freq: Once | INTRAVENOUS | Status: AC | PRN
  Administered 2014-11-30: 100 mL via INTRAVENOUS

## 2014-11-30 NOTE — ED Provider Notes (Signed)
Pt left at change of shift to get results of her CT. patient reports she started getting peripheral edema while on the Decadron. She was on diaphoretic however they stopped them because they were tapering down her Decadron dose. Patient is noted to have 2+ edema of her lower extremities. When I palpate her chest and neck area her neck looks full but there is no pitting edema. She states she feels tightness in her skin of her chest and neck and it makes her feel like she is smothering when she lays down. We decided to try a dose of IV Lasix and I gave her prescription for some Lasix pills to try at home until she is off the Decadron and hopefully she can get the edema to improve.   Ct Angio Chest Pe W/cm &/or Wo Cm  11/30/2014   CLINICAL DATA:  Acute onset of sharp generalized chest pain and tightness. Shortness of breath. Increased fatigue and bilateral lower extremity swelling. Current history of metastatic breast cancer, on chemotherapy. Initial encounter.  EXAM: CT ANGIOGRAPHY CHEST WITH CONTRAST  TECHNIQUE: Multidetector CT imaging of the chest was performed using the standard protocol during bolus administration of intravenous contrast. Multiplanar CT image reconstructions and MIPs were obtained to evaluate the vascular anatomy.  CONTRAST:  164mL OMNIPAQUE IOHEXOL 350 MG/ML SOLN  COMPARISON:  PET/CT performed 11/01/2014  FINDINGS: There is no evidence of pulmonary embolus.  Minimal bilateral atelectasis is noted. The lungs are otherwise clear. There is no evidence of significant focal consolidation, pleural effusion or pneumothorax. No masses are identified; no abnormal focal contrast enhancement is seen.  The mediastinum is unremarkable in appearance. No mediastinal lymphadenopathy is seen. No pericardial effusion is identified. The great vessels are grossly unremarkable in appearance. A right-sided chest port is noted. No axillary lymphadenopathy is seen. The visualized portions of the thyroid gland are  unremarkable in appearance.  There is diffuse nodularity of the liver, compatible with hepatic cirrhosis. Underlying vague hypodensities measure up to 3.7 cm in size, likely reflecting the patient's known hepatic metastases. A nonspecific 1.6 cm hypodensity is noted at the lateral aspect of the spleen.  Numerous scattered small sclerotic lesions within the visualized osseous structures reflect known bony metastases.  Review of the MIP images confirms the above findings.  IMPRESSION: 1. No evidence of pulmonary embolus. 2. Minimal bilateral atelectasis noted; lungs otherwise clear. 3. Findings of hepatic cirrhosis. 4. Vague hepatic hypodensities measure up to 3.7 cm, reflecting the patient's known hepatic metastases. 5. Nonspecific 1.6 cm hypodensity at the lateral aspect of the spleen. 6. Numerous scattered small sclerotic bony metastases seen.   Electronically Signed   By: Garald Balding M.D.   On: 11/30/2014 01:30    Diagnoses that have been ruled out:  None  Diagnoses that are still under consideration:  None  Final diagnoses:  Chest pain  Shortness of breath    Discharge Medication List as of 11/30/2014  2:22 AM    START taking these medications   Details  furosemide (LASIX) 40 MG tablet Take 2 po once or twice daily for excess fluid, Print        Plan discharge  Rolland Porter, MD, Barbette Or, MD 11/30/14 [phone]

## 2014-11-30 NOTE — Discharge Instructions (Signed)
Use the lasix to get off the excess fluid until you are off of the decadron. Recheck as needed.

## 2014-11-30 NOTE — ED Notes (Signed)
Patient ambulated to bathroom with no assistance or difficulty. 

## 2014-12-04 ENCOUNTER — Other Ambulatory Visit: Payer: Self-pay | Admitting: Family Medicine

## 2014-12-04 ENCOUNTER — Ambulatory Visit (HOSPITAL_COMMUNITY)
Admission: RE | Admit: 2014-12-04 | Discharge: 2014-12-04 | Disposition: A | Discharge status: Home / Self Care | Payer: 59 | Source: Ambulatory Visit | Attending: Hematology & Oncology | Admitting: Hematology & Oncology

## 2014-12-04 ENCOUNTER — Other Ambulatory Visit (HOSPITAL_COMMUNITY): Payer: Self-pay | Admitting: Hematology & Oncology

## 2014-12-04 DIAGNOSIS — C50919 Malignant neoplasm of unspecified site of unspecified female breast: Secondary | ICD-10-CM | POA: Diagnosis not present

## 2014-12-04 DIAGNOSIS — C7931 Secondary malignant neoplasm of brain: Secondary | ICD-10-CM

## 2014-12-06 ENCOUNTER — Telehealth (HOSPITAL_COMMUNITY): Payer: Self-pay | Admitting: *Deleted

## 2014-12-06 ENCOUNTER — Other Ambulatory Visit (HOSPITAL_COMMUNITY): Payer: Self-pay | Admitting: Oncology

## 2014-12-06 DIAGNOSIS — C50919 Malignant neoplasm of unspecified site of unspecified female breast: Secondary | ICD-10-CM

## 2014-12-06 DIAGNOSIS — C7951 Secondary malignant neoplasm of bone: Secondary | ICD-10-CM

## 2014-12-06 MED ORDER — MORPHINE SULFATE ER 30 MG PO TBCR: 30.0000 mg | EXTENDED_RELEASE_TABLET | Freq: Two times a day (BID) | ORAL | Status: DC

## 2014-12-06 NOTE — Telephone Encounter (Signed)
Ready for pick up

## 2014-12-18 ENCOUNTER — Encounter (HOSPITAL_BASED_OUTPATIENT_CLINIC_OR_DEPARTMENT_OTHER): Payer: 59

## 2014-12-18 ENCOUNTER — Encounter: Payer: Self-pay | Admitting: *Deleted

## 2014-12-18 ENCOUNTER — Other Ambulatory Visit (HOSPITAL_COMMUNITY): Payer: Self-pay | Admitting: Oncology

## 2014-12-18 ENCOUNTER — Telehealth (HOSPITAL_COMMUNITY): Payer: Self-pay | Admitting: Emergency Medicine

## 2014-12-18 ENCOUNTER — Encounter (HOSPITAL_COMMUNITY): Payer: Self-pay | Admitting: Hematology & Oncology

## 2014-12-18 ENCOUNTER — Encounter (HOSPITAL_COMMUNITY): Payer: 59 | Attending: Hematology & Oncology | Admitting: Hematology & Oncology

## 2014-12-18 VITALS — BP 123/66 | HR 90 | Temp 98.0°F | Resp 18 | Wt 259.3 lb

## 2014-12-18 VITALS — BP 119/67 | HR 90 | Temp 98.2°F | Resp 18

## 2014-12-18 DIAGNOSIS — F329 Major depressive disorder, single episode, unspecified: Secondary | ICD-10-CM

## 2014-12-18 DIAGNOSIS — F32A Depression, unspecified: Secondary | ICD-10-CM

## 2014-12-18 DIAGNOSIS — C7951 Secondary malignant neoplasm of bone: Secondary | ICD-10-CM | POA: Insufficient documentation

## 2014-12-18 DIAGNOSIS — C7931 Secondary malignant neoplasm of brain: Secondary | ICD-10-CM

## 2014-12-18 DIAGNOSIS — C229 Malignant neoplasm of liver, not specified as primary or secondary: Secondary | ICD-10-CM | POA: Diagnosis not present

## 2014-12-18 DIAGNOSIS — Z5112 Encounter for antineoplastic immunotherapy: Secondary | ICD-10-CM | POA: Diagnosis not present

## 2014-12-18 DIAGNOSIS — R519 Headache, unspecified: Secondary | ICD-10-CM

## 2014-12-18 DIAGNOSIS — C50919 Malignant neoplasm of unspecified site of unspecified female breast: Secondary | ICD-10-CM

## 2014-12-18 DIAGNOSIS — C50812 Malignant neoplasm of overlapping sites of left female breast: Secondary | ICD-10-CM | POA: Diagnosis not present

## 2014-12-18 DIAGNOSIS — E876 Hypokalemia: Secondary | ICD-10-CM

## 2014-12-18 DIAGNOSIS — G4700 Insomnia, unspecified: Secondary | ICD-10-CM

## 2014-12-18 DIAGNOSIS — R51 Headache: Secondary | ICD-10-CM

## 2014-12-18 DIAGNOSIS — C787 Secondary malignant neoplasm of liver and intrahepatic bile duct: Secondary | ICD-10-CM | POA: Diagnosis not present

## 2014-12-18 DIAGNOSIS — F419 Anxiety disorder, unspecified: Secondary | ICD-10-CM

## 2014-12-18 LAB — CBC WITH DIFFERENTIAL/PLATELET
Basophils Absolute: 0 10*3/uL (ref 0.0–0.1)
Basophils Relative: 0 % (ref 0–1)
EOS ABS: 0 10*3/uL (ref 0.0–0.7)
EOS PCT: 0 % (ref 0–5)
HEMATOCRIT: 34.9 % — AB (ref 36.0–46.0)
Hemoglobin: 11.4 g/dL — ABNORMAL LOW (ref 12.0–15.0)
LYMPHS PCT: 12 % (ref 12–46)
Lymphs Abs: 0.6 10*3/uL — ABNORMAL LOW (ref 0.7–4.0)
MCH: 31.8 pg (ref 26.0–34.0)
MCHC: 32.7 g/dL (ref 30.0–36.0)
MCV: 97.5 fL (ref 78.0–100.0)
Monocytes Absolute: 0.4 10*3/uL (ref 0.1–1.0)
Monocytes Relative: 8 % (ref 3–12)
NEUTROS ABS: 4.2 10*3/uL (ref 1.7–7.7)
NEUTROS PCT: 80 % — AB (ref 43–77)
Platelets: 218 10*3/uL (ref 150–400)
RBC: 3.58 MIL/uL — ABNORMAL LOW (ref 3.87–5.11)
RDW: 15.4 % (ref 11.5–15.5)
WBC: 5.2 10*3/uL (ref 4.0–10.5)

## 2014-12-18 LAB — COMPREHENSIVE METABOLIC PANEL
ALBUMIN: 3.4 g/dL — AB (ref 3.5–5.0)
ALT: 35 U/L (ref 14–54)
ANION GAP: 11 (ref 5–15)
AST: 27 U/L (ref 15–41)
Alkaline Phosphatase: 37 U/L — ABNORMAL LOW (ref 38–126)
BUN: 8 mg/dL (ref 6–20)
CO2: 28 mmol/L (ref 22–32)
Calcium: 8.1 mg/dL — ABNORMAL LOW (ref 8.9–10.3)
Chloride: 95 mmol/L — ABNORMAL LOW (ref 101–111)
Creatinine, Ser: 0.76 mg/dL (ref 0.44–1.00)
GFR calc non Af Amer: >60 mL/min (ref >60)
Glucose, Bld: 128 mg/dL — ABNORMAL HIGH (ref 65–99)
POTASSIUM: 3.1 mmol/L — AB (ref 3.5–5.1)
Sodium: 134 mmol/L — ABNORMAL LOW (ref 135–145)
TOTAL PROTEIN: 6.3 g/dL — AB (ref 6.5–8.1)
Total Bilirubin: 0.6 mg/dL (ref 0.3–1.2)

## 2014-12-18 MED ORDER — POTASSIUM CHLORIDE CRYS ER 20 MEQ PO TBCR: 20.0000 meq | EXTENDED_RELEASE_TABLET | Freq: Two times a day (BID) | ORAL | Status: DC

## 2014-12-18 MED ORDER — HEPARIN SOD (PORK) LOCK FLUSH 100 UNIT/ML IV SOLN
500.0000 [IU] | Freq: Once | INTRAVENOUS | Status: AC | PRN
  Administered 2014-12-18: 500 [IU]
  Filled 2014-12-18: qty 5

## 2014-12-18 MED ORDER — SODIUM CHLORIDE 0.9 % IJ SOLN
10.0000 mL | INTRAMUSCULAR | Status: DC | PRN
  Administered 2014-12-18: 10 mL
  Filled 2014-12-18: qty 10

## 2014-12-18 MED ORDER — ACETAMINOPHEN 325 MG PO TABS
650.0000 mg | ORAL_TABLET | Freq: Once | ORAL | Status: AC
  Administered 2014-12-18: 650 mg via ORAL
  Filled 2014-12-18: qty 2

## 2014-12-18 MED ORDER — DIPHENHYDRAMINE HCL 25 MG PO CAPS
50.0000 mg | ORAL_CAPSULE | Freq: Once | ORAL | Status: AC
  Administered 2014-12-18: 50 mg via ORAL
  Filled 2014-12-18: qty 2

## 2014-12-18 MED ORDER — TRASTUZUMAB CHEMO INJECTION 440 MG
6.0000 mg/kg | Freq: Once | INTRAVENOUS | Status: AC
  Administered 2014-12-18: 672 mg via INTRAVENOUS
  Filled 2014-12-18: qty 32

## 2014-12-18 MED ORDER — SODIUM CHLORIDE 0.9 % IV SOLN
Freq: Once | INTRAVENOUS | Status: AC
  Administered 2014-12-18: 09:00:00 via INTRAVENOUS

## 2014-12-18 NOTE — Progress Notes (Signed)
[facility]  Clinical Social Work was referred by nurse for assessment of psychosocial needs due to depression and possible disease progression. Clinical Social Worker met with patient at [facility] in room alone during her treatment to offer support and assess for needs. Pt reports she has been sleeping a lot , all day and all night. She denies pain issues or anxiety concerns, shared she is "really, really tired". She feels she may be slightly depressed, but it is very hard to tell if this is her cancer or depression. [name] has been able to process her cancer diagnosis on a high level and is able to share her emotions openly. CSW and pt discussed in detail the common emotions patients and caregivers experience. She is open to attending Brain Tumor Support group next month for additional support. Pt is open to counseling with her husband so they can get additional assistance in processing their emotions and coping with past and anticipatory grief work. She reports to have worked with a Social worker in the past and will contact her. CSW will continue to follow closely at [facility] for additional counseling and support.      Clinical Social Work interventions: Emotional support   Grier Shataria Crist, [place] Tuesdays   [phone]

## 2014-12-18 NOTE — Progress Notes (Signed)
[place] Labrum, MD [place] [id number]    Breast cancer, stage 4   04/25/2014 Initial Diagnosis Breast cancer, stage 4   04/25/2014 Imaging CT abdomen/pelvia with widespread metastatic disease to the liver, multiple lytic lesions throughout spine and pelvis. No FX or epidural tumor identified   04/26/2014 Imaging CT head unremarkable   04/26/2014 Imaging CT chest with no lung mass or pulmonary nodules, no adenopathy. Lytic bone lesions, right 2nd rib   04/27/2014 Initial Biopsy U/S guided liver biopsy, lesion in anterior and inferior left hepatic lobe biopsied   04/27/2014 Pathology Results Metastatic adenocarcinoma, CK7, ER+, patchy positivity with PR. Possible primary includes breast, less likely gynecologic   05/15/2014 Mammogram BI-RADS CATEGORY  2: Benign Finding(s)   05/16/2014 PET scan 1. Intensely hypermetabolic hepatic metastasis. 2. Widespread hypermetabolic skeletal lesions. 3. No primary adenocarcinoma identified by FDG PET imaging.   05/19/2014 Imaging MUGA- Left ventricular ejection fracture greater than 70%.   05/21/2014 Breast MRI No suspicious masses or enhancement within the breasts. No axillary adenopathy.   05/22/2014 - 07/03/2014 Antibody Plan Herceptin/Perjeta/Tamoxifen   06/12/2014 - 07/03/2014 Chemotherapy Taxotere added secondary to persistent abdominal and back pain   06/17/2014 - 06/19/2014 Hospital Admission Neutropenia, fever, diarrhea, nausea, vomiting   06/20/2014 - 07/10/2014 Radiation Therapy Dr. Thea Silversmith 12 fractions to L3-S3 (30 Gy) and left scapula (20 Gy).    07/03/2014 Adverse Reaction Perjeta- induced diarrhea.  Perjeta discontinued   07/16/2014 - 07/20/2014 Hospital Admission Electrolyte abnormalities, and diarrhea.  Suspect Perjeta-induced diarrhea.  Negative GI work-up.   07/24/2014 -  Chemotherapy Herceptin/Tamoxifen/Xgeva   08/21/2014 Imaging MUGA- Left ventricular ejection fraction equals 71%.   08/24/2014 PET scan Dramatic reduction in metabolic  activity of the widespread liver metastasis. Liver metastasis now have metabolic activity equal to background normal liver activity. Liver has a nodular contour. Marked reduction in metabolic activity of skeletal lesions..   10/05/2014 Progression Widespread metastatic disease to the brain as described. Between 20 and 30 intracranial metastatic deposits are now seen. No midline shift or incipient herniation   10/09/2014 - 10/26/2014 Radiation Therapy Whole Brain XRT    Stage IV adenocarcinoma with metastases to liver and bone  Had last mammogram in [place] this year, Dr. Gaetano Net  Colonoscopy 05/18/2013  Breast Reduction 03/17/2011  CURRENT THERAPY: Herceptin/Tamoxifen/XGEVA   INTERVAL HISTORY: [name] 49 y.o. female returns for follow-up of stage IV adenocarcinoma.   The lights were off upon entry to the room. Her sister stayed in the waiting room during our visit. She has been sleeping a lot and not eating. She attributes the loss of appetite to feelings of sadness and not feeling well. She also attributes it to discontinuing her steroids but notes that she does not want to take them again. She is unable to recall the last time she saw Dr. Pablo Ledger. She notes she has confusion often. She has been experiencing headaches. She is "scared of these headaches". She has them often, though they do not keep her up at night. When asked where the headaches are, [name] motioned to the back of her head.  She is on Effexor for depression and notes that it helps with her hot flashes. Her sister reports that [name] has been irritable. [name] says she can no longer "act normally" around family; that is she can no longer pretend that everything is ok. She says her husband no longer speaks to her because "he has nothing to say". They  are not currently in counseling. She reports that him seeing her sick, depresses him.  She facetimes her granddaughter daily. She notes that there are  positive things in her life but certainly is having difficulty coping.  MEDICAL HISTORY: Past Medical History  Diagnosis Date  . PONV (postoperative nausea and vomiting)     pt states scope patch does well  . Anemia   . Depression   . Anxiety   . Headache(784.0)     has migraines - medication controls  . Family history of prostate cancer   . Hot flashes due to tamoxifen 09/25/2014  . Breast cancer 04/2014    Presumed dx; stage 4 w/ mets to bone and liver, brain lesions  . Bony metastasis   . S/P small bowel resection     has Hyperlipidemia; Depression; Breast cancer, stage 4; Bone metastases; DVT (deep venous thrombosis); GERD (gastroesophageal reflux disease); Family history of prostate cancer; Genetic testing; and Hot flashes due to tamoxifen on her problem list.      has No Known Allergies.  [name] does not currently have medications on file.  SURGICAL HISTORY: Past Surgical History  Procedure Laterality Date  . Right rotator cuff      2002  . Neck fusion      2003  . Laparoscopic cholecystectomy      2004  . Right knee arthroscopy      2005  . Appendectomy      2008  . Abdominal hysterectomy    . Breast reduction surgery  03/17/2011    Procedure: MAMMARY REDUCTION BILATERAL (BREAST);  Surgeon: Mary A Contogiannis;  Location: [facility];  Service: Plastics;  Laterality: Bilateral;  . Colonoscopy N/A 07/13/2013    Procedure: COLONOSCOPY;  Surgeon: Rogene Houston, MD;  [facility];  Service: Endoscopy;  Laterality: N/A;  930  . Liver biopsy  04/2014  . Portacath placement    . Esophagogastroduodenoscopy N/A 05/25/2014    Procedure: ESOPHAGOGASTRODUODENOSCOPY (EGD);  Surgeon: Rogene Houston, MD;  [facility];  Service: Endoscopy;  Laterality: N/A;  155  . Laparoscopic appendectomy    . Colonoscopy N/A 11/26/2014    Procedure: COLONOSCOPY;  Surgeon: Rogene Houston, MD;  [facility];  Service: Endoscopy;  Laterality:  N/A;  730    SOCIAL HISTORY: History   Social History  . Marital Status: Married    Spouse Name: N/A  . Number of Children: N/A  . Years of Education: N/A   Occupational History  . Not on file.   Social History Main Topics  . Smoking status: Former Smoker    Types: Cigarettes    Quit date: 08/20/1994  . Smokeless tobacco: Never Used  . Alcohol Use: Yes     Comment: Occasionally  . Drug Use: No  . Sexual Activity: Yes    Birth Control/ Protection: Surgical   Other Topics Concern  . Not on file   Social History Narrative    FAMILY HISTORY: Family History  Problem Relation Age of Onset  . Diabetes Father   . Heart attack Maternal Grandmother 30    multiple over lifetime.  . Cancer Maternal Grandmother 45    NOS  . Prostate cancer Maternal Grandfather     dx in his 12s  . Lung cancer Paternal Grandfather     dx <50  . Lymphoma Maternal Aunt     dx in her 78s  . Melanoma Cousin 4    maternal  first cousin  . Brain cancer Cousin     paternal first cousin dx under 38  . Prostate cancer Other     MGF's father  . Colon cancer Other     MGM's mother  Her son lives in [place]. Her daughter lives in [place].  Review of Systems  Constitutional: Positive for malaise/fatigue. Negative for fever, chills, weight loss.  HENT: . Negative for congestion, hearing loss, sore throat and tinnitus.   Eyes: Positive for blurred vision. Negative for double vision, pain and discharge.  Respiratory:  Negative for cough, hemoptysis, sputum production, shortness of breath and wheezing.   Cardiovascular: Negative for chest pain, palpitations, claudication, and PND.  Gastrointestinal:  Negative for heartburn, vomiting, abdominal pain, diarrhea, constipation, blood in stool and melena.  Genitourinary:Negative for dysuria, urgency, frequency and hematuria.  Musculoskeletal: Negative for myalgias, joint pain and falls.  Skin: Negative for itching and rash.  Neurological: Positive for  headaches. Negative for tingling, tremors, speech change, focal weakness, seizures, loss of consciousness, weakness. Headaches or blurry vision Endo/Heme/Allergies: Does not bruise/bleed easily.  Psychiatric/Behavioral: Positive for nervous/anxiety, and depression. Negative for suicidal ideas, memory loss and substance abuse. 14 point review of systems was performed and is negative except as detailed under history of present illness and above  PHYSICAL EXAMINATION ECOG PERFORMANCE STATUS: 1 - Symptomatic but completely ambulatory  Filed Vitals:   12/18/14 0919  BP: 123/66  Pulse: 90  Temp: 98 F (36.7 C)  Resp: 18    Physical Exam  Constitutional: She is oriented to person, place, and time and well-developed, well-nourished, and in no distress. Anxious.  Facial Cushingoid features  Distant HENT:  Head: Normocephalic and atraumatic.  Nose: Nose normal.  Mouth/Throat:  Negative Eyes: Conjunctivae and EOM are normal. Pupils are equal, round, and reactive to light. Right eye exhibits no discharge. Left eye exhibits no discharge. No scleral icterus.  Neck: Normal range of motion. No tracheal deviation present. No thyromegaly present.  Cardiovascular: Normal rate, regular rhythm and normal heart sounds.  Exam reveals no gallop and no friction rub.   No murmur heard. Pulmonary/Chest: Effort normal and breath sounds normal. She has no wheezes. She has no rales.  Abdominal: Soft. Bowel sounds are normal. She exhibits no distension and no mass. There is no tenderness. There is no rebound and no guarding.  Musculoskeletal: Normal range of motion.  Lymphadenopathy:    She has no cervical adenopathy.  Neurological: She is alert and oriented to person, place, and time. She has normal reflexes. No cranial nerve deficit. Gait normal. Coordination normal.  Skin: Skin is warm and dry. No rash noted.  Psychiatric: Mood, memory, affect and judgment normal.  Nursing note and vitals reviewed. Breast:  Left breast exam notes an area of palpable firmness at approximately the 11:00 position close to the nipple  LABORATORY DATA:  CBC    Component Value Date/Time   WBC 5.2 12/18/2014 1055   RBC 3.58* 12/18/2014 1055   HGB 11.4* 12/18/2014 1055   HCT 34.9* 12/18/2014 1055   PLT 218 12/18/2014 1055   MCV 97.5 12/18/2014 1055   MCH 31.8 12/18/2014 1055   MCHC 32.7 12/18/2014 1055   RDW 15.4 12/18/2014 1055   LYMPHSABS 0.6* 12/18/2014 1055   MONOABS 0.4 12/18/2014 1055   EOSABS 0.0 12/18/2014 1055   BASOSABS 0.0 12/18/2014 1055   CMP     Component Value Date/Time   NA 134* 12/18/2014 1055   K 3.1* 12/18/2014 1055   CL  95* 12/18/2014 1055   CO2 28 12/18/2014 1055   GLUCOSE 128* 12/18/2014 1055   BUN 8 12/18/2014 1055   CREATININE 0.76 12/18/2014 1055   CALCIUM 8.1* 12/18/2014 1055   PROT 6.3* 12/18/2014 1055   ALBUMIN 3.4* 12/18/2014 1055   AST 27 12/18/2014 1055   ALT 35 12/18/2014 1055   ALKPHOS 37* 12/18/2014 1055   BILITOT 0.6 12/18/2014 1055   GFRNONAA >60 12/18/2014 1055   GFRAA >60 12/18/2014 1055   RADIOLOGY:  CLINICAL DATA: Subsequent treatment strategy for breast cancer with brain metastases. Restaging examination.  EXAM: NUCLEAR MEDICINE PET SKULL BASE TO THIGH  TECHNIQUE: 11.4 mCi F-18 FDG was injected intravenously. Full-ring PET imaging was performed from the skull base to thigh after the radiotracer. CT data was obtained and used for attenuation correction and anatomic localization.  FASTING BLOOD GLUCOSE: Value: 96 mg/dl  COMPARISON: PET-CT 08/24/2014, as well as multiple other prior examinations.  FINDINGS: NECK  No hypermetabolic lymph nodes in the neck.  CHEST  No hypermetabolic mediastinal or hilar nodes. No suspicious pulmonary nodules on the CT scan. Right-sided single-lumen internal jugular Port-A-Cath with tip terminating at the superior cavoatrial junction. Heart size is normal. There is no significant  pericardial fluid, thickening or pericardial calcification. No acute consolidative airspace disease. No pleural effusions.  ABDOMEN/PELVIS  No abnormal hypermetabolic activity within the liver, pancreas, adrenal glands, or spleen. No hypermetabolic lymph nodes in the abdomen or pelvis. The liver again has a very nodular contour, and diffuse parenchymal abnormalities, presumably treated metastases. Heterogeneous metabolic activity throughout the liver without discrete hypermetabolic lesion on today's examination. The largest discrete lesion by CT imaging is in segment 7 (image 90 of series 4) measuring 3.2 x 3.7 cm, similar to the prior study, presumably a treated metastasis. Status post cholecystectomy.  SKELETON  While there is no focal hypermetabolic activity in the skeleton on today's examination, there are again innumerable sclerotic lesions throughout the visualized axial and appendicular skeleton, compatible with widespread treated skeletal metastasis. Healing fracture of the neck of the acromion process in the left scapula is noted.  IMPRESSION: 1. Continued positive response to therapy. All hypermetabolism previously noted in hepatic and skeletal metastasis has resolved. No new hypermetabolic metastatic lesions identified in the neck, chest, abdomen or pelvis. Innumerable treated skeletal metastases are redemonstrated. The very nodular contour of the liver and diffuse irregularity of the hepatic parenchyma is similar to the prior examination, as discussed above, related to innumerable treated metastases. 2. Additional incidental findings, as above.   Electronically Signed  By: Vinnie Langton M.D.  On: 11/01/2014 11:04   ASSESSMENT and THERAPY PLAN:  Stage IV ER positive, HER-2 positive carcinoma of the breast Widespread Brain Metastases End of Life issues Insomnia Anxiety  [name] is clearly not doing well today. She is having a hard time  distinguishing whether it is depression or her disease. I believe that it is obviously a combination of both. She is clearly depressed, her husband has distanced himself recently and this is very difficult for her.  I have suggested counseling and she is agreeable. I have also suggested close follow-up and if things begin to improve at home but she continues to physically not feel well then we will discuss other options in regards to therapy.  For now , she will continue on Herceptin every 3 weeks and tamoxifen daily. Her disease systemically is well controlled. Her last brain MRI showed some improvement in her disease. We will need to reschedule another MRI coming  up. She is not interested in restarting the dexamethasone although it helps with her headaches.  Abby Potash has met with the patient today and they have arranged for outpatient counseling for the patient and her husband. In addition the patient has been provided with information on the brain tumor support group which Abby Potash feels  will be very beneficial.  She will return in 2 weeks to discuss progress and for additional planning.  All questions were answered. The patient knows to call the clinic with any problems, questions or concerns. We can certainly see the patient much sooner if necessary.   This note was signed electronically  This document serves as a record of services personally performed by Ancil Linsey, MD. It was created on her behalf by Arlyce Harman, a trained medical scribe. The creation of this record is based on the scribe's personal observations and the provider's statements to them. This document has been checked and approved by the attending provider.  I have reviewed the above documentation for accuracy and completeness, and I agree with the above.  Kelby Fam. Whitney Muse, MD

## 2014-12-18 NOTE — Patient Instructions (Signed)
..  [place] at [facility] Discharge Instructions  RECOMMENDATIONS MADE BY THE CONSULTANT AND ANY TEST RESULTS WILL BE SENT TO YOUR REFERRING PHYSICIAN.  Dr.  Muse would like for you to see the counselor as you discussed with the social worker.  You will return week after next for a follow up appointment with Dr.  Muse.  Please see your schedule for listed appointments.    Thank you for choosing [place] at [facility] to provide your oncology and hematology care.  To afford each patient quality time with our provider, please arrive at least 15 minutes before your scheduled appointment time.    You need to re-schedule your appointment should you arrive 10 or more minutes late.  We strive to give you quality time with our providers, and arriving late affects you and other patients whose appointments are after yours.  Also, if you no show three or more times for appointments you may be dismissed from the clinic at the providers discretion.     Again, thank you for choosing [facility].  Our hope is that these requests will decrease the amount of time that you wait before being seen by our physicians.       _____________________________________________________________  Should you have questions after your visit to [facility], please contact our office at [phone] between the hours of 8:30 a.m. and 4:30 p.m.  Voicemails left after 4:30 p.m. will not be returned until the following business day.  For prescription refill requests, have your pharmacy contact our office.

## 2014-12-18 NOTE — Telephone Encounter (Signed)
Pt notified of potassium level and that potassium was e-scribed.  Pt verbalized understanding

## 2014-12-18 NOTE — Telephone Encounter (Signed)
-----   Message from [facility], PA-C sent at 12/18/2014 12:16 PM EDT ----- Kdur 20 mEq BID escribed, #40.

## 2014-12-18 NOTE — Patient Instructions (Signed)
[facility] Discharge Instructions for Patients Receiving Chemotherapy  Today you received the following chemotherapy agents:  herceptin  If you develop nausea and vomiting, or diarrhea that is not controlled by your medication, call the clinic.  The clinic phone number is [phone]. Office hours are Monday-Friday 8:30am-5:00pm.  BELOW ARE SYMPTOMS THAT SHOULD BE REPORTED IMMEDIATELY:  *FEVER GREATER THAN 101.0 F  *CHILLS WITH OR WITHOUT FEVER  NAUSEA AND VOMITING THAT IS NOT CONTROLLED WITH YOUR NAUSEA MEDICATION  *UNUSUAL SHORTNESS OF BREATH  *UNUSUAL BRUISING OR BLEEDING  TENDERNESS IN MOUTH AND THROAT WITH OR WITHOUT PRESENCE OF ULCERS  *URINARY PROBLEMS  *BOWEL PROBLEMS  UNUSUAL RASH Items with * indicate a potential emergency and should be followed up as soon as possible. If you have an emergency after office hours please contact your primary care physician or go to the nearest emergency department.  Please call the clinic during office hours if you have any questions or concerns.   You may also contact the Patient Navigator at [phone] should you have any questions or need assistance in obtaining follow up care. _____________________________________________________________________ Have you asked about our STAR program?    STAR stands for Survivorship Training and Rehabilitation, and this is a nationally recognized cancer care program that focuses on survivorship and rehabilitation.  Cancer and cancer treatments may cause problems, such as, pain, making you feel tired and keeping you from doing the things that you need or want to do. Cancer rehabilitation can help. Our goal is to reduce these troubling effects and help you have the best quality of life possible.  You may receive a survey from a nurse that asks questions about your current state of health.  Based on the survey results, all eligible patients will be referred to the [facility] program for  an evaluation so we can better serve you! A frequently asked questions sheet is available upon request.

## 2014-12-18 NOTE — Progress Notes (Signed)
1205:  Tolerated infusion w/o adverse reaction.  VSS.  A&Ox4. Discharged via wheelchair in c/o sister for transport home.

## 2014-12-19 LAB — CANCER ANTIGEN 27.29: CA 27.29: 30.7 U/mL (ref 0.0–38.6)

## 2014-12-20 ENCOUNTER — Encounter (HOSPITAL_COMMUNITY): Payer: Self-pay | Admitting: *Deleted

## 2014-12-20 ENCOUNTER — Telehealth (HOSPITAL_COMMUNITY): Payer: Self-pay | Admitting: *Deleted

## 2014-12-20 NOTE — Telephone Encounter (Signed)
Patient instructed to stop Haldol and to call us on Monday 12/24/14 to let us know how she is doing. Patient said she will call me on Monday to let me know how she is doing.

## 2014-12-24 ENCOUNTER — Other Ambulatory Visit (HOSPITAL_COMMUNITY): Payer: Self-pay | Admitting: *Deleted

## 2014-12-24 ENCOUNTER — Ambulatory Visit (HOSPITAL_COMMUNITY)
Admission: RE | Admit: 2014-12-24 | Discharge: 2014-12-24 | Disposition: A | Discharge status: Home / Self Care | Payer: 59 | Source: Ambulatory Visit | Attending: Hematology & Oncology | Admitting: Hematology & Oncology

## 2014-12-24 ENCOUNTER — Encounter (HOSPITAL_BASED_OUTPATIENT_CLINIC_OR_DEPARTMENT_OTHER): Payer: 59

## 2014-12-24 VITALS — BP 124/67 | HR 67 | Temp 98.3°F | Resp 18

## 2014-12-24 DIAGNOSIS — C50919 Malignant neoplasm of unspecified site of unspecified female breast: Secondary | ICD-10-CM

## 2014-12-24 DIAGNOSIS — C7951 Secondary malignant neoplasm of bone: Secondary | ICD-10-CM

## 2014-12-24 DIAGNOSIS — C787 Secondary malignant neoplasm of liver and intrahepatic bile duct: Secondary | ICD-10-CM | POA: Diagnosis not present

## 2014-12-24 DIAGNOSIS — Z853 Personal history of malignant neoplasm of breast: Secondary | ICD-10-CM | POA: Insufficient documentation

## 2014-12-24 DIAGNOSIS — C7931 Secondary malignant neoplasm of brain: Secondary | ICD-10-CM | POA: Insufficient documentation

## 2014-12-24 DIAGNOSIS — C50812 Malignant neoplasm of overlapping sites of left female breast: Secondary | ICD-10-CM | POA: Diagnosis not present

## 2014-12-24 DIAGNOSIS — R112 Nausea with vomiting, unspecified: Secondary | ICD-10-CM | POA: Insufficient documentation

## 2014-12-24 DIAGNOSIS — R110 Nausea: Secondary | ICD-10-CM | POA: Diagnosis not present

## 2014-12-24 DIAGNOSIS — R51 Headache: Secondary | ICD-10-CM | POA: Diagnosis present

## 2014-12-24 DIAGNOSIS — C229 Malignant neoplasm of liver, not specified as primary or secondary: Secondary | ICD-10-CM | POA: Diagnosis not present

## 2014-12-24 LAB — TSH: TSH: 0.774 u[IU]/mL (ref 0.350–4.500)

## 2014-12-24 LAB — BASIC METABOLIC PANEL
ANION GAP: 10 (ref 5–15)
BUN: 7 mg/dL (ref 6–20)
CHLORIDE: 103 mmol/L (ref 101–111)
CO2: 26 mmol/L (ref 22–32)
Calcium: 9.5 mg/dL (ref 8.9–10.3)
Creatinine, Ser: 0.85 mg/dL (ref 0.44–1.00)
GFR calc Af Amer: >60 mL/min (ref >60)
Glucose, Bld: 108 mg/dL — ABNORMAL HIGH (ref 65–99)
POTASSIUM: 3.9 mmol/L (ref 3.5–5.1)
SODIUM: 139 mmol/L (ref 135–145)

## 2014-12-24 MED ORDER — GADOBENATE DIMEGLUMINE 529 MG/ML IV SOLN
20.0000 mL | Freq: Once | INTRAVENOUS | Status: AC | PRN
  Administered 2014-12-24: 20 mL via INTRAVENOUS

## 2014-12-24 MED ORDER — HEPARIN SOD (PORK) LOCK FLUSH 100 UNIT/ML IV SOLN
500.0000 [IU] | Freq: Once | INTRAVENOUS | Status: AC
  Administered 2014-12-24: 500 [IU] via INTRAVENOUS

## 2014-12-24 MED ORDER — LOPERAMIDE HCL 2 MG PO CAPS
4.0000 mg | ORAL_CAPSULE | Freq: Once | ORAL | Status: AC
  Administered 2014-12-24: 4 mg via ORAL

## 2014-12-24 MED ORDER — MORPHINE SULFATE ER 30 MG PO TBCR
30.0000 mg | EXTENDED_RELEASE_TABLET | Freq: Once | ORAL | Status: AC
  Administered 2014-12-24: 30 mg via ORAL
  Filled 2014-12-24: qty 1

## 2014-12-24 MED ORDER — HEPARIN SOD (PORK) LOCK FLUSH 100 UNIT/ML IV SOLN
INTRAVENOUS | Status: AC
  Filled 2014-12-24: qty 5

## 2014-12-24 MED ORDER — SODIUM CHLORIDE 0.9 % IJ SOLN
10.0000 mL | INTRAMUSCULAR | Status: DC | PRN
  Administered 2014-12-24: 10 mL via INTRAVENOUS
  Filled 2014-12-24: qty 10

## 2014-12-24 MED ORDER — PALONOSETRON HCL INJECTION 0.25 MG/5ML
0.2500 mg | Freq: Once | INTRAVENOUS | Status: AC
  Administered 2014-12-24: 0.25 mg via INTRAVENOUS
  Filled 2014-12-24: qty 5

## 2014-12-24 MED ORDER — SODIUM CHLORIDE 0.9 % IV SOLN
8.0000 mg | Freq: Once | INTRAVENOUS | Status: AC
  Administered 2014-12-24: 8 mg via INTRAVENOUS
  Filled 2014-12-24: qty 0.8

## 2014-12-24 MED ORDER — OXYCODONE-ACETAMINOPHEN 5-325 MG PO TABS
1.0000 | ORAL_TABLET | Freq: Once | ORAL | Status: AC
Start: 2014-12-24 — End: 2014-12-24
  Administered 2014-12-24: 1 via ORAL
  Filled 2014-12-24: qty 1

## 2014-12-24 MED ORDER — SODIUM CHLORIDE 0.9 % IV SOLN
INTRAVENOUS | Status: DC
  Administered 2014-12-24: 12:00:00 via INTRAVENOUS

## 2014-12-24 NOTE — Patient Instructions (Addendum)
[place] at [facility] Discharge Instructions  RECOMMENDATIONS MADE BY THE CONSULTANT AND ANY TEST RESULTS WILL BE SENT TO YOUR REFERRING PHYSICIAN.  IV fluids and medications today for pain, nausea, and diarrhea. Return as scheduled this week for IV fluids.   Thank you for choosing [place] at [facility] to provide your oncology and hematology care.  To afford each patient quality time with our provider, please arrive at least 15 minutes before your scheduled appointment time.    You need to re-schedule your appointment should you arrive 10 or more minutes late.  We strive to give you quality time with our providers, and arriving late affects you and other patients whose appointments are after yours.  Also, if you no show three or more times for appointments you may be dismissed from the clinic at the providers discretion.     Again, thank you for choosing [facility].  Our hope is that these requests will decrease the amount of time that you wait before being seen by our physicians.       _____________________________________________________________  Should you have questions after your visit to [facility], please contact our office at [phone] between the hours of 8:30 a.m. and 4:30 p.m.  Voicemails left after 4:30 p.m. will not be returned until the following business day.  For prescription refill requests, have your pharmacy contact our office.

## 2014-12-24 NOTE — Addendum Note (Signed)
Addended by: Joie Bimler on: 12/24/2014 03:10 PM   Modules accepted: Orders, SmartSet

## 2014-12-25 ENCOUNTER — Encounter (HOSPITAL_BASED_OUTPATIENT_CLINIC_OR_DEPARTMENT_OTHER): Payer: 59

## 2014-12-25 ENCOUNTER — Encounter: Payer: Self-pay | Admitting: *Deleted

## 2014-12-25 ENCOUNTER — Encounter (HOSPITAL_COMMUNITY): Payer: 59

## 2014-12-25 ENCOUNTER — Other Ambulatory Visit (HOSPITAL_COMMUNITY): Payer: Self-pay | Admitting: Hematology & Oncology

## 2014-12-25 ENCOUNTER — Encounter (HOSPITAL_COMMUNITY): Payer: Self-pay | Admitting: Hematology & Oncology

## 2014-12-25 ENCOUNTER — Other Ambulatory Visit (HOSPITAL_COMMUNITY): Payer: 59

## 2014-12-25 ENCOUNTER — Encounter (HOSPITAL_BASED_OUTPATIENT_CLINIC_OR_DEPARTMENT_OTHER): Payer: 59 | Admitting: Hematology & Oncology

## 2014-12-25 ENCOUNTER — Encounter (HOSPITAL_COMMUNITY): Payer: Self-pay | Admitting: *Deleted

## 2014-12-25 VITALS — BP 115/73 | HR 91 | Temp 98.3°F | Resp 16 | Wt 255.0 lb

## 2014-12-25 VITALS — BP 102/62 | HR 71 | Temp 98.5°F | Resp 16 | Wt 251.0 lb

## 2014-12-25 DIAGNOSIS — Z7189 Other specified counseling: Secondary | ICD-10-CM

## 2014-12-25 DIAGNOSIS — C7951 Secondary malignant neoplasm of bone: Secondary | ICD-10-CM | POA: Diagnosis not present

## 2014-12-25 DIAGNOSIS — R112 Nausea with vomiting, unspecified: Secondary | ICD-10-CM | POA: Diagnosis not present

## 2014-12-25 DIAGNOSIS — C7931 Secondary malignant neoplasm of brain: Secondary | ICD-10-CM

## 2014-12-25 DIAGNOSIS — C50919 Malignant neoplasm of unspecified site of unspecified female breast: Secondary | ICD-10-CM

## 2014-12-25 DIAGNOSIS — C229 Malignant neoplasm of liver, not specified as primary or secondary: Secondary | ICD-10-CM | POA: Diagnosis not present

## 2014-12-25 DIAGNOSIS — C50812 Malignant neoplasm of overlapping sites of left female breast: Secondary | ICD-10-CM

## 2014-12-25 DIAGNOSIS — G4700 Insomnia, unspecified: Secondary | ICD-10-CM

## 2014-12-25 DIAGNOSIS — C787 Secondary malignant neoplasm of liver and intrahepatic bile duct: Secondary | ICD-10-CM | POA: Diagnosis not present

## 2014-12-25 DIAGNOSIS — F419 Anxiety disorder, unspecified: Secondary | ICD-10-CM

## 2014-12-25 LAB — CORTISOL: Cortisol, Plasma: 6.7 ug/dL

## 2014-12-25 MED ORDER — SODIUM CHLORIDE 0.9 % IV SOLN: INTRAVENOUS | Status: DC

## 2014-12-25 MED ORDER — LORAZEPAM 2 MG/ML IJ SOLN
INTRAMUSCULAR | Status: AC
  Filled 2014-12-25: qty 1

## 2014-12-25 MED ORDER — PROMETHAZINE HCL 25 MG/ML IJ SOLN
INTRAMUSCULAR | Status: AC
  Filled 2014-12-25: qty 1

## 2014-12-25 MED ORDER — SODIUM CHLORIDE 0.9 % IV SOLN
INTRAVENOUS | Status: DC
Start: 2014-12-25 — End: 2014-12-25
  Administered 2014-12-25: 10:00:00 via INTRAVENOUS

## 2014-12-25 MED ORDER — PROMETHAZINE HCL 12.5 MG PO TABS
ORAL_TABLET | ORAL | Status: AC
  Filled 2014-12-25: qty 1

## 2014-12-25 MED ORDER — HEPARIN SOD (PORK) LOCK FLUSH 100 UNIT/ML IV SOLN
500.0000 [IU] | Freq: Once | INTRAVENOUS | Status: AC
  Administered 2014-12-25: 500 [IU] via INTRAVENOUS

## 2014-12-25 MED ORDER — PALONOSETRON HCL INJECTION 0.25 MG/5ML
INTRAVENOUS | Status: AC
  Filled 2014-12-25: qty 5

## 2014-12-25 MED ORDER — PROMETHAZINE HCL 25 MG/ML IJ SOLN
25.0000 mg | INTRAMUSCULAR | Status: AC
  Administered 2014-12-25: 25 mg via INTRAVENOUS

## 2014-12-25 MED ORDER — LORAZEPAM 2 MG/ML IJ SOLN
0.5000 mg | Freq: Once | INTRAMUSCULAR | Status: AC
  Administered 2014-12-25: 0.5 mg via INTRAVENOUS

## 2014-12-25 MED ORDER — PALONOSETRON HCL INJECTION 0.25 MG/5ML
0.2500 mg | Freq: Once | INTRAVENOUS | Status: AC
  Administered 2014-12-25: 0.25 mg via INTRAVENOUS

## 2014-12-25 MED ORDER — DEXAMETHASONE 4 MG PO TABS: 4.0000 mg | ORAL_TABLET | Freq: Two times a day (BID) | ORAL | Status: DC

## 2014-12-25 MED ORDER — HEPARIN SOD (PORK) LOCK FLUSH 100 UNIT/ML IV SOLN
INTRAVENOUS | Status: AC
  Filled 2014-12-25: qty 5

## 2014-12-25 MED ORDER — DENOSUMAB 120 MG/1.7ML ~~LOC~~ SOLN
120.0000 mg | Freq: Once | SUBCUTANEOUS | Status: AC
  Administered 2014-12-25: 120 mg via SUBCUTANEOUS
  Filled 2014-12-25: qty 1.7

## 2014-12-25 MED ORDER — PROMETHAZINE HCL 25 MG PO TABS: 25.0000 mg | ORAL_TABLET | Freq: Four times a day (QID) | ORAL | Status: DC | PRN

## 2014-12-25 MED ORDER — MECLIZINE HCL 25 MG PO TABS: 25.0000 mg | ORAL_TABLET | Freq: Four times a day (QID) | ORAL | Status: DC | PRN

## 2014-12-25 NOTE — Patient Instructions (Signed)
[place] at [facility] Discharge Instructions  RECOMMENDATIONS MADE BY THE CONSULTANT AND ANY TEST RESULTS WILL BE SENT TO YOUR REFERRING PHYSICIAN.  Exam and discussion by Dr. Whitney Muse Will get you set up for counseling Need to stop xarelto today and resume it on Saturday Lumbar Puncture on Friday  Follow-up next week as scheduled.  Thank you for choosing [place] at [facility] to provide your oncology and hematology care.  To afford each patient quality time with our provider, please arrive at least 15 minutes before your scheduled appointment time.    You need to re-schedule your appointment should you arrive 10 or more minutes late.  We strive to give you quality time with our providers, and arriving late affects you and other patients whose appointments are after yours.  Also, if you no show three or more times for appointments you may be dismissed from the clinic at the providers discretion.     Again, thank you for choosing [facility].  Our hope is that these requests will decrease the amount of time that you wait before being seen by our physicians.       _____________________________________________________________  Should you have questions after your visit to [facility], please contact our office at [phone] between the hours of 8:30 a.m. and 4:30 p.m.  Voicemails left after 4:30 p.m. will not be returned until the following business day.  For prescription refill requests, have your pharmacy contact our office.

## 2014-12-25 NOTE — Progress Notes (Signed)
[place] Clinical Social Work  Clinical Social Work was referred by [place] rounding due to increased depressive symptoms last week. Pt here today with her husband. CSW met with pt for follow up and reassessment of needs. Pt reports pt and husband have appt for counseling this Thursday at 1330. CSW put together several activities for pt to work on that review emotions and meaningfulness for advanced cancer pts. CSW encouraged pt to consider several short term goals and reflect on what gives her meaning. CSW provided pt with handouts about upcoming Living with Cancer Group at [facility] and activities. Pt appeared in better spirits today. CSW also encouraged pt to consider activities at [employer] center. CSW to continue to follow and assist.   Clinical Social Work interventions: Healing arts activities Resource education Emotional support  [place], [place] Tuesdays   [phone]

## 2014-12-25 NOTE — Progress Notes (Signed)
Labs drawn

## 2014-12-25 NOTE — Progress Notes (Signed)
Allison Labrum, MD [place] [address]    Breast cancer, stage 4   04/25/2014 Initial Diagnosis Breast cancer, stage 4   04/25/2014 Imaging CT abdomen/pelvia with widespread metastatic disease to the liver, multiple lytic lesions throughout spine and pelvis. No FX or epidural tumor identified   04/26/2014 Imaging CT head unremarkable   04/26/2014 Imaging CT chest with no lung mass or pulmonary nodules, no adenopathy. Lytic bone lesions, right 2nd rib   04/27/2014 Initial Biopsy U/S guided liver biopsy, lesion in anterior and inferior left hepatic lobe biopsied   04/27/2014 Pathology Results Metastatic adenocarcinoma, CK7, ER+, patchy positivity with PR. Possible primary includes breast, less likely gynecologic   05/15/2014 Mammogram BI-RADS CATEGORY  2: Benign Finding(s)   05/16/2014 PET scan 1. Intensely hypermetabolic hepatic metastasis. 2. Widespread hypermetabolic skeletal lesions. 3. No primary adenocarcinoma identified by FDG PET imaging.   05/19/2014 Imaging MUGA- Left ventricular ejection fracture greater than 70%.   05/21/2014 Breast MRI No suspicious masses or enhancement within the breasts. No axillary adenopathy.   05/22/2014 - 07/03/2014 Antibody Plan Herceptin/Perjeta/Tamoxifen   06/12/2014 - 07/03/2014 Chemotherapy Taxotere added secondary to persistent abdominal and back pain   06/17/2014 - 06/19/2014 Hospital Admission Neutropenia, fever, diarrhea, nausea, vomiting   06/20/2014 - 07/10/2014 Radiation Therapy Dr. Thea Silversmith 12 fractions to L3-S3 (30 Gy) and left scapula (20 Gy).    07/03/2014 Adverse Reaction Perjeta- induced diarrhea.  Perjeta discontinued   07/16/2014 - 07/20/2014 Hospital Admission Electrolyte abnormalities, and diarrhea.  Suspect Perjeta-induced diarrhea.  Negative GI work-up.   07/24/2014 -  Chemotherapy Herceptin/Tamoxifen/Xgeva   08/21/2014 Imaging MUGA- Left ventricular ejection fraction equals 71%.   08/24/2014 PET scan Dramatic reduction in metabolic  activity of the widespread liver metastasis. Liver metastasis now have metabolic activity equal to background normal liver activity. Liver has a nodular contour. Marked reduction in metabolic activity of skeletal lesions..   10/05/2014 Progression Widespread metastatic disease to the brain as described. Between 20 and 30 intracranial metastatic deposits are now seen. No midline shift or incipient herniation   10/09/2014 - 10/26/2014 Radiation Therapy Whole Brain XRT    Stage IV adenocarcinoma with metastases to liver and bone  Had last mammogram in [place] this year, Dr. Gaetano Net  Colonoscopy 05/18/2013  Breast Reduction 03/17/2011  CURRENT THERAPY: Herceptin/Tamoxifen/XGEVA   INTERVAL HISTORY: [name] 49 y.o. female returns for follow-up of stage IV adenocarcinoma.   She is here today with her husband. The lights were off in the room upon entry. She was receiving IVF.  She is not feeling well at all today. She says right now she is talking a lot, but she feels "so bad." She's afraid to stop talking because she's afraid she's going to "start puking." She got up this morning, took a shower, and told Thayer Jew "I kind of feel yucky," then when she got ready to iron clothes she felt sick. She "couldn't make it to the bathroom," and ended up throwing up until she was dry heaving. She says she had nothing to eat the day before, and ended up vomiting a green liquid this morning. Any time she had her eyes open, she says she felt sick. Her nausea is worse with movement, but if she lays still or sits still she feels all right. The motion-sensitive nausea began over the last few days. She says it is "not vertigo."  She is also afraid of making sudden movements for fear of falling.  She says her  head "feels hot."  She states that she is very unhappy about her hair loss.. She states that her hair loss is "heartbreaking." She has had sparse hair growth, but was advised that her hair may not fully grow  back.  Her thought processes are mostly about death. She's worried about her family members and is also personally scared of death. She's worried "what's going to  happen to her family, are they going to stay close to [employer] also very worried about her grandchildren. She feels she will be fine if she passes, because she will go to heaven, and "all that's gonna be fine." But she's afraid of "how it's gonna happen," and that it's unknown. She says she "wishes she didn't think about it." She has spoken with hospice. She has been meeting with our chaplin here at [facility].  She has been experiencing increased emotions, noting that she cried during [employer] commercials.  Her husband reports that nothing has been "normal" for the two of them since December. He lost his daughter suddenly 2 months ago.  Abby Potash previously gave them information for a brain cancer support meeting in [place]. She is still interested in attending although [place] is a distance.   They have plans for family vacations in the upcoming months -- a trip to [place] in September, a family beach trip in October, and a birthday cruise through [place]. She is fearful she will not be able to go secondary to her persistent headaches, now nausea and vomiting.  MEDICAL HISTORY: Past Medical History  Diagnosis Date  . PONV (postoperative nausea and vomiting)     pt states scope patch does well  . Anemia   . Depression   . Anxiety   . Headache(784.0)     has migraines - medication controls  . Family history of prostate cancer   . Hot flashes due to tamoxifen 09/25/2014  . Breast cancer 04/2014    Presumed dx; stage 4 w/ mets to bone and liver, brain lesions  . Bony metastasis   . S/P small bowel resection     has Hyperlipidemia; Depression; Breast cancer, stage 4; Bone metastases; DVT (deep venous thrombosis); GERD (gastroesophageal reflux disease); Family history of prostate cancer; Genetic testing; and Hot flashes due to  tamoxifen on her problem list.      has No Known Allergies.  [name] does not currently have medications on file.  SURGICAL HISTORY: Past Surgical History  Procedure Laterality Date  . Right rotator cuff      2002  . Neck fusion      2003  . Laparoscopic cholecystectomy      2004  . Right knee arthroscopy      2005  . Appendectomy      2008  . Abdominal hysterectomy    . Breast reduction surgery  03/17/2011    Procedure: MAMMARY REDUCTION BILATERAL (BREAST);  Surgeon: Mary A Contogiannis;  Location: [facility];  Service: Plastics;  Laterality: Bilateral;  . Colonoscopy N/A 07/13/2013    Procedure: COLONOSCOPY;  Surgeon: Rogene Houston, MD;  [facility];  Service: Endoscopy;  Laterality: N/A;  930  . Liver biopsy  04/2014  . Portacath placement    . Esophagogastroduodenoscopy N/A 05/25/2014    Procedure: ESOPHAGOGASTRODUODENOSCOPY (EGD);  Surgeon: Rogene Houston, MD;  [facility];  Service: Endoscopy;  Laterality: N/A;  155  . Laparoscopic appendectomy    . Colonoscopy N/A 11/26/2014    Procedure: COLONOSCOPY;  Surgeon: Rogene Houston, MD;  [facility];  Service: Endoscopy;  Laterality: N/A;  730    SOCIAL HISTORY: Social History   Social History  . Marital Status: Married    Spouse Name: N/A  . Number of Children: N/A  . Years of Education: N/A   Occupational History  . Not on file.   Social History Main Topics  . Smoking status: Former Smoker    Types: Cigarettes    Quit date: 08/20/1994  . Smokeless tobacco: Never Used  . Alcohol Use: Yes     Comment: Occasionally  . Drug Use: No  . Sexual Activity: Yes    Birth Control/ Protection: Surgical   Other Topics Concern  . Not on file   Social History Narrative    FAMILY HISTORY: Family History  Problem Relation Age of Onset  . Diabetes Father   . Heart attack Maternal Grandmother 30    multiple over lifetime.  . Cancer Maternal Grandmother 29    NOS   . Prostate cancer Maternal Grandfather     dx in his 55s  . Lung cancer Paternal Grandfather     dx <50  . Lymphoma Maternal Aunt     dx in her 32s  . Melanoma Cousin 68    maternal first cousin  . Brain cancer Cousin     paternal first cousin dx under 56  . Prostate cancer Other     MGF's father  . Colon cancer Other     MGM's mother  Her son lives in [place]. Her daughter lives in [place].  Review of Systems  Constitutional: Positive for malaise/fatigue. Negative for fever, chills, weight loss.  HENT: . Negative for congestion, hearing loss, sore throat and tinnitus.   Eyes: Positive for blurred vision. Negative for double vision, pain and discharge.  Respiratory:  Negative for cough, hemoptysis, sputum production, shortness of breath and wheezing.   Cardiovascular: Negative for chest pain, palpitations, claudication, and PND.  Gastrointestinal:  Positive for nausea and vomiting. Negative for heartburn, abdominal pain, diarrhea, constipation, blood in stool and melena. Vomiting and nausea worsened/sometimes triggered by movement.  Genitourinary:Negative for dysuria, urgency, frequency and hematuria.  Musculoskeletal: Negative for myalgias, joint pain and falls.  Skin: Negative for itching and rash.  Neurological: Positive for headaches and confusion. Negative for tingling, tremors, speech change, focal weakness, seizures, loss of consciousness, weakness. Headaches or blurry vision Endo/Heme/Allergies: Does not bruise/bleed easily.  Psychiatric/Behavioral: Positive for nervous/anxiety, and depression. Negative for suicidal ideas, memory loss and substance abuse. 14 point review of systems was performed and is negative except as detailed under history of present illness and above  PHYSICAL EXAMINATION  Otoscope used  ECOG PERFORMANCE STATUS: 1 - Symptomatic but completely ambulatory  Filed Vitals:   12/25/14 0900  BP: 102/62  Pulse: 71  Temp: 98.5 F (36.9 C)  Resp:  16    Physical Exam  Constitutional: She is oriented to person, place, and time and well-developed, well-nourished, and in no distress. Anxious.  Facial Cushingoid features   HENT:  Ears: No fluid noted in left ear. Right ear appears a little dull, with fluid behind but minimal. TM;s otherwise WNL Head: Normocephalic and atraumatic. Alopecia Nose: Nose normal.  Mouth/Throat:  Negative Eyes: Conjunctivae and EOM are normal. Pupils are equal, round, and reactive to light. Right eye exhibits no discharge. Left eye exhibits no discharge. No scleral icterus.  Neck: Normal range of motion. No tracheal deviation present. No thyromegaly present.  Cardiovascular: Normal rate, regular rhythm and normal heart sounds.  Exam reveals no gallop and no friction rub.   No murmur heard. Pulmonary/Chest: Effort normal and breath sounds normal. She has no wheezes. She has no rales.  Abdominal: Soft. Bowel sounds are normal. She exhibits no distension and no mass. There is no tenderness. There is no rebound and no guarding.  Musculoskeletal: Normal range of motion.  Lymphadenopathy:    She has no cervical adenopathy.  Neurological: She is alert and oriented to person, place, and time. She has normal reflexes. No cranial nerve deficit. Gait normal. Coordination normal.  Skin: Skin is warm and dry. No rash noted.  Psychiatric: Mood, memory, affect and judgment normal.  Nursing note and vitals reviewed.  LABORATORY DATA:  CBC    Component Value Date/Time   WBC 5.2 12/18/2014 1055   RBC 3.58* 12/18/2014 1055   HGB 11.4* 12/18/2014 1055   HCT 34.9* 12/18/2014 1055   PLT 218 12/18/2014 1055   MCV 97.5 12/18/2014 1055   MCH 31.8 12/18/2014 1055   MCHC 32.7 12/18/2014 1055   RDW 15.4 12/18/2014 1055   LYMPHSABS 0.6* 12/18/2014 1055   MONOABS 0.4 12/18/2014 1055   EOSABS 0.0 12/18/2014 1055   BASOSABS 0.0 12/18/2014 1055   CMP     Component Value Date/Time   NA 139 12/24/2014 1220   K 3.9 12/24/2014  1220   CL 103 12/24/2014 1220   CO2 26 12/24/2014 1220   GLUCOSE 108* 12/24/2014 1220   BUN 7 12/24/2014 1220   CREATININE 0.85 12/24/2014 1220   CALCIUM 9.5 12/24/2014 1220   PROT 6.3* 12/18/2014 1055   ALBUMIN 3.4* 12/18/2014 1055   AST 27 12/18/2014 1055   ALT 35 12/18/2014 1055   ALKPHOS 37* 12/18/2014 1055   BILITOT 0.6 12/18/2014 1055   GFRNONAA >60 12/24/2014 1220   GFRAA >60 12/24/2014 1220   RADIOLOGY: CLINICAL DATA: Nausea, vomiting, and headache for 2 days. History of metastatic breast cancer. Fall at home.  EXAM: MRI HEAD WITHOUT AND WITH CONTRAST  TECHNIQUE: Multiplanar, multiecho pulse sequences of the brain and surrounding structures were obtained without and with intravenous contrast.  CONTRAST: 28m MULTIHANCE GADOBENATE DIMEGLUMINE 529 MG/ML IV SOLN  COMPARISON: 11/20/2014 IMPRESSION: 1. Decreased size of all brain metastases. No significant residual edema. No new lesions identified. 2. No acute intracranial abnormality.   Electronically Signed  By: ALogan Bores On: 12/24/2014 11:43  ASSESSMENT and THERAPY PLAN:  Stage IV ER positive, HER-2 positive carcinoma of the breast Widespread Brain Metastases End of Life issues Insomnia Anxiety Nausea/vomiting   I reviewed the patient's head MRI with she and her husband. She has marked improvement in her disease. I have arranged for a lumbar puncture Friday morning at 10:00. We will send it for cell count, cytology, Gram stain. I advised her that we have to wait until Friday as she needs to hold her Xarelto for several days prior.  I have checked a TSH that was normal. I have checked an a.m. cortisol today.  I discussed the ongoing problems she is having with Dr. SIsidore Moos she is going to present her at the brain tumor board next week. We have restarted her steroids at 4 mg twice daily. She was provided a prescription for phenergan and antivert.  I again discussed issues revolving around death  and dying. She and her husband are to attend counseling this upcoming Thursday. Please note a total of 40 minutes was spent in direct patient and family  consultation and advisement and physical exam.  All questions were answered. The patient knows to call the clinic with any problems, questions or concerns. We can certainly see the patient much sooner if necessary.   This note was signed electronically  This document serves as a record of services personally performed by Ancil Linsey, MD. It was created on her behalf by Toni Amend, a trained medical scribe. The creation of this record is based on the scribe's personal observations and the provider's statements to them. This document has been checked and approved by the attending provider.  I have reviewed the above documentation for accuracy and completeness, and I agree with the above.  Kelby Fam. Whitney Muse, MD

## 2014-12-25 NOTE — Patient Instructions (Signed)
[place] at [facility] Discharge Instructions  RECOMMENDATIONS MADE BY THE CONSULTANT AND ANY TEST RESULTS WILL BE SENT TO YOUR REFERRING PHYSICIAN.  IV fluids today and nausea medication given today X-geva given today Return as scheduled Please call the clinic if you have any questions or concerns  Thank you for choosing [place] at [facility] to provide your oncology and hematology care.  To afford each patient quality time with our provider, please arrive at least 15 minutes before your scheduled appointment time.    You need to re-schedule your appointment should you arrive 10 or more minutes late.  We strive to give you quality time with our providers, and arriving late affects you and other patients whose appointments are after yours.  Also, if you no show three or more times for appointments you may be dismissed from the clinic at the providers discretion.     Again, thank you for choosing [facility].  Our hope is that these requests will decrease the amount of time that you wait before being seen by our physicians.       _____________________________________________________________  Should you have questions after your visit to [facility], please contact our office at [phone] between the hours of 8:30 a.m. and 4:30 p.m.  Voicemails left after 4:30 p.m. will not be returned until the following business day.  For prescription refill requests, have your pharmacy contact our office.

## 2014-12-25 NOTE — Progress Notes (Signed)
0900 pt had vomited this am and c/o nausea, dr Whitney Muse notified orders received  .[name]'s reason for visit today is for an injection and labs as scheduled per MD orders.  Labs were drawn prior to administration of ordered medication.    [name] also received x-geva 120 mg sq in abd sq per MD orders; see MAR for administration details.  [name] tolerated all procedures well and without incident; questions were answered and patient was discharged.  [name] Phenergan IV given before discharge Tolerated IV fluids well today Discharged via wheelchair

## 2014-12-25 NOTE — Progress Notes (Signed)
Please see infusion encounter for more informtion

## 2014-12-26 ENCOUNTER — Encounter (HOSPITAL_COMMUNITY): Payer: Self-pay

## 2014-12-26 ENCOUNTER — Encounter (HOSPITAL_BASED_OUTPATIENT_CLINIC_OR_DEPARTMENT_OTHER): Payer: 59

## 2014-12-26 DIAGNOSIS — C50812 Malignant neoplasm of overlapping sites of left female breast: Secondary | ICD-10-CM

## 2014-12-26 DIAGNOSIS — C787 Secondary malignant neoplasm of liver and intrahepatic bile duct: Secondary | ICD-10-CM

## 2014-12-26 DIAGNOSIS — C7931 Secondary malignant neoplasm of brain: Secondary | ICD-10-CM | POA: Diagnosis not present

## 2014-12-26 DIAGNOSIS — C7951 Secondary malignant neoplasm of bone: Secondary | ICD-10-CM | POA: Diagnosis not present

## 2014-12-26 MED ORDER — HEPARIN SOD (PORK) LOCK FLUSH 100 UNIT/ML IV SOLN
500.0000 [IU] | Freq: Once | INTRAVENOUS | Status: AC
  Administered 2014-12-26: 500 [IU] via INTRAVENOUS

## 2014-12-26 MED ORDER — SODIUM CHLORIDE 0.9 % IV SOLN
INTRAVENOUS | Status: DC
  Administered 2014-12-26: 10:00:00 via INTRAVENOUS

## 2014-12-26 MED ORDER — HEPARIN SOD (PORK) LOCK FLUSH 100 UNIT/ML IV SOLN
INTRAVENOUS | Status: AC
  Filled 2014-12-26: qty 5

## 2014-12-26 NOTE — Patient Instructions (Signed)
[facility] at [facility] Discharge Instructions  RECOMMENDATIONS MADE BY THE CONSULTANT AND ANY TEST RESULTS WILL BE SENT TO YOUR REFERRING PHYSICIAN.  IV fluids today Follow up as scheduled Please call the clinic if you have any questions or concerns  Thank you for choosing [facility] at [facility] to provide your oncology and hematology care.  To afford each patient quality time with our provider, please arrive at least 15 minutes before your scheduled appointment time.    You need to re-schedule your appointment should you arrive 10 or more minutes late.  We strive to give you quality time with our providers, and arriving late affects you and other patients whose appointments are after yours.  Also, if you no show three or more times for appointments you may be dismissed from the clinic at the providers discretion.     Again, thank you for choosing [facility].  Our hope is that these requests will decrease the amount of time that you wait before being seen by our physicians.       _____________________________________________________________  Should you have questions after your visit to [facility], please contact our office at [phone] between the hours of 8:30 a.m. and 4:30 p.m.  Voicemails left after 4:30 p.m. will not be returned until the following business day.  For prescription refill requests, have your pharmacy contact our office.     

## 2014-12-26 NOTE — Progress Notes (Signed)
Lanny H Blubaugh Tolerated IV fluids  Discharged ambulatory

## 2014-12-27 ENCOUNTER — Encounter (HOSPITAL_BASED_OUTPATIENT_CLINIC_OR_DEPARTMENT_OTHER): Payer: 59

## 2014-12-27 ENCOUNTER — Other Ambulatory Visit (HOSPITAL_COMMUNITY): Payer: Self-pay | Admitting: *Deleted

## 2014-12-27 VITALS — BP 120/57 | HR 58 | Temp 97.7°F | Resp 16

## 2014-12-27 DIAGNOSIS — C7931 Secondary malignant neoplasm of brain: Secondary | ICD-10-CM | POA: Diagnosis not present

## 2014-12-27 DIAGNOSIS — C50919 Malignant neoplasm of unspecified site of unspecified female breast: Secondary | ICD-10-CM

## 2014-12-27 DIAGNOSIS — C7951 Secondary malignant neoplasm of bone: Secondary | ICD-10-CM | POA: Diagnosis not present

## 2014-12-27 DIAGNOSIS — C787 Secondary malignant neoplasm of liver and intrahepatic bile duct: Secondary | ICD-10-CM

## 2014-12-27 DIAGNOSIS — C50812 Malignant neoplasm of overlapping sites of left female breast: Secondary | ICD-10-CM

## 2014-12-27 MED ORDER — FUROSEMIDE 20 MG PO TABS: 20.0000 mg | ORAL_TABLET | Freq: Every day | ORAL | Status: DC

## 2014-12-27 MED ORDER — SODIUM CHLORIDE 0.9 % IV SOLN
INTRAVENOUS | Status: DC
  Administered 2014-12-27: 12:00:00 via INTRAVENOUS

## 2014-12-27 MED ORDER — HEPARIN SOD (PORK) LOCK FLUSH 100 UNIT/ML IV SOLN
INTRAVENOUS | Status: AC
  Filled 2014-12-27: qty 5

## 2014-12-27 MED ORDER — HEPARIN SOD (PORK) LOCK FLUSH 100 UNIT/ML IV SOLN
500.0000 [IU] | Freq: Once | INTRAVENOUS | Status: AC
  Administered 2014-12-27: 500 [IU] via INTRAVENOUS

## 2014-12-27 NOTE — Progress Notes (Signed)
Patient states that today is a "good day".  She feels much better, walks into the clinic, and states she doesn't even need to lay in the offered bed.  She denies nausea, pain, or bowel issues.  Infusion tolerated well.

## 2014-12-27 NOTE — Patient Instructions (Signed)
[place] at [facility] Discharge Instructions  RECOMMENDATIONS MADE BY THE CONSULTANT AND ANY TEST RESULTS WILL BE SENT TO YOUR REFERRING PHYSICIAN.  We have given you 1 liter of NS today  Compression hose bilaterally. Please wear at night (8-10 hours) to help decrease swelling in legs.  Lasix 20mg  daily. Take 1 tablet daily.  Thank you for choosing [place] at [facility] to provide your oncology and hematology care.  To afford each patient quality time with our provider, please arrive at least 15 minutes before your scheduled appointment time.    You need to re-schedule your appointment should you arrive 10 or more minutes late.  We strive to give you quality time with our providers, and arriving late affects you and other patients whose appointments are after yours.  Also, if you no show three or more times for appointments you may be dismissed from the clinic at the providers discretion.     Again, thank you for choosing [facility].  Our hope is that these requests will decrease the amount of time that you wait before being seen by our physicians.       _____________________________________________________________  Should you have questions after your visit to [facility], please contact our office at [phone] between the hours of 8:30 a.m. and 4:30 p.m.  Voicemails left after 4:30 p.m. will not be returned until the following business day.  For prescription refill requests, have your pharmacy contact our office.

## 2014-12-28 ENCOUNTER — Ambulatory Visit (HOSPITAL_COMMUNITY)
Admission: RE | Admit: 2014-12-28 | Discharge: 2014-12-28 | Disposition: A | Discharge status: Home / Self Care | Payer: 59 | Source: Ambulatory Visit | Attending: Hematology & Oncology | Admitting: Hematology & Oncology

## 2014-12-28 DIAGNOSIS — R51 Headache: Secondary | ICD-10-CM | POA: Diagnosis present

## 2014-12-28 DIAGNOSIS — C50919 Malignant neoplasm of unspecified site of unspecified female breast: Secondary | ICD-10-CM | POA: Insufficient documentation

## 2014-12-28 DIAGNOSIS — C7931 Secondary malignant neoplasm of brain: Secondary | ICD-10-CM

## 2014-12-28 LAB — CSF CELL COUNT WITH DIFFERENTIAL
RBC COUNT CSF: 1 /mm3 — AB
TUBE #: 4
WBC CSF: 1 /mm3 (ref 0–5)

## 2014-12-28 LAB — GRAM STAIN: Gram Stain: NONE SEEN

## 2014-12-28 LAB — PROTEIN, CSF: Total  Protein, CSF: 29 mg/dL (ref 15–45)

## 2014-12-28 LAB — GLUCOSE, CSF: Glucose, CSF: 52 mg/dL (ref 40–70)

## 2014-12-28 MED ORDER — SODIUM CHLORIDE 0.9 % IJ SOLN
INTRAMUSCULAR | Status: AC
  Filled 2014-12-28: qty 3

## 2014-12-28 MED ORDER — HEPARIN SOD (PORK) LOCK FLUSH 100 UNIT/ML IV SOLN
INTRAVENOUS | Status: AC
  Filled 2014-12-28: qty 5

## 2014-12-28 NOTE — Discharge Instructions (Signed)
Lumbar Puncture °A lumbar puncture, or spinal tap, is a procedure in which a small amount of the fluid that surrounds the brain and spinal cord is removed and examined. The fluid is called the cerebrospinal fluid. This procedure may be done to:  °· Help diagnose various problems, such as meningitis, encephalitis, multiple sclerosis, and AIDS.   °· Remove fluid and relieve pressure that occurs with certain types of headaches.   °· Look for bleeding within the brain and spinal cord areas (central nervous system).   °· Place medicine into the spinal fluid.   °LET YOUR HEALTH CARE PROVIDER KNOW ABOUT: °· Any allergies you have. °· All medicines you are taking, including vitamins, herbs, eye drops, creams, and over-the-counter medicines. °· Previous problems you or members of your family have had with the use of anesthetics. °· Any blood disorders you have. °· Previous surgeries you have had. °· Medical conditions you have. °RISKS AND COMPLICATIONS °Generally, this is a safe procedure. However, as with any procedure, complications can occur. Possible complications include:  °· Spinal headache. This is a severe headache that occurs when there is a leak of spinal fluid. A spinal headache causes discomfort but is not dangerous. If it persists, another procedure may be done to treat the headache. °· Bleeding. This most often occurs in people with bleeding disorders. These are disorders in which the blood does not clot normally.   °· Infection at the insertion site that can spread to the bone or spinal fluid.  °· Formation of a spinal cord tumor (rare). °· Brain herniation or movement of the brain into the spinal cord (rare). °· Inability to move (extremely rare). °BEFORE THE PROCEDURE °· You may have blood tests done. These tests can help tell how well your kidneys and liver are working. They can also show how well your blood clots.   °· If you take blood thinners (anticoagulant medicine), ask your health care provider if  and when you should stop taking them.   °· Your health care provider may order a CT scan of your brain. °· Make arrangements for someone to drive you home after the procedure.     °PROCEDURE °· You will be positioned so that the spaces between the bones of the spine (vertebrae) are as wide as possible. This will make it easier to pass the needle into the spinal canal.  °· Depending on your age and size, you may lie on your side, curled up with your knees under your chin. Or, you may sit with your head resting on a pillow that is placed at waist level. °· The skin covering the lower back (or lumbar region) will be cleaned.   °· The skin may be numbed with medicine. °· You may be given pain medicine or a medicine to help you relax (sedative).   °· A small needle will be inserted in the skin until it enters the space that contains the spinal fluid. The needle will not enter the spinal cord.   °· The spinal fluid will be collected into tubes.   °· The needle will be withdrawn, and a bandage will be placed on the site.   °AFTER THE PROCEDURE °· You will remain lying down for 1 hour or for as long as your health care provider suggests.   °· The spinal fluid will be sent to a laboratory to be examined. The results of the examination may be available before you go home. °· A test, called a culture, may be taken of the spinal fluid if your health care provider thinks you have an infection. If cultures   were taken for exam, the results will usually be available in a couple of days.  Document Released: 04/24/2000 Document Revised: 02/15/2013 Document Reviewed: 01/02/2013 [facility] Patient Information 2015 [place], [place]. This information is not intended to replace advice given to you by your health care provider. Make sure you discuss any questions you have with your health care provider. Lumbar Puncture, Care After Refer to this sheet in the next few weeks. These instructions provide you with information on caring for  yourself after your procedure. Your health care provider may also give you more specific instructions. Your treatment has been planned according to current medical practices, but problems sometimes occur. Call your health care provider if you have any problems or questions after your procedure. WHAT TO EXPECT AFTER THE PROCEDURE After your procedure, it is typical to have the following sensations:  Mild discomfort or pain at the insertion site.  Mild headache that is relieved with pain medicines. HOME CARE INSTRUCTIONS  Avoid lifting anything heavier than 10 lb (4.5 kg) for at least 12 hours after the procedure.  Drink enough fluids to keep your urine clear or pale yellow. SEEK MEDICAL CARE IF:  You have fever or chills.  You have nausea or vomiting.  You have a headache that lasts for more than 2 days. SEEK IMMEDIATE MEDICAL CARE IF:  You have any numbness or tingling in your legs.  You are unable to control your bowel or bladder.  You have bleeding or swelling in your back at the insertion site.  You are dizzy or faint. Document Released: 05/02/2013 Document Reviewed: 05/02/2013 [facility] Patient Information 2015 [place], [place]. This information is not intended to replace advice given to you by your health care provider. Make sure you discuss any questions you have with your health care provider.

## 2014-12-28 NOTE — Progress Notes (Signed)
Pt ambulatory to the bathroom, slight headache, unchanged, no other complaints.

## 2014-12-28 NOTE — Procedures (Signed)
CLINICAL DATA: [Treated brain metastasis. Persistent headache.] EXAM: DIAGNOSTIC LUMBAR PUNCTURE UNDER FLUOROSCOPIC GUIDANCE FLUOROSCOPY TIME:  I Fluoroscopy Time (in minutes and seconds): [1 minutes and 36 seconds] PROCEDURE: Informed consent was obtained from the patient prior to the procedure, including potential complications of headache, allergy, and pain. With the patient prone, the lower back was prepped with Betadine. 1% Lidocaine was used for local anesthesia. Lumbar puncture was performed at the [L3-4] level using a [20] gauge needle with return of [clear] CSF with an opening pressure of [27] cm water. [9] ml of CSF were obtained for laboratory studies. The patient tolerated the procedure well and there were no apparent complications. IMPRESSION: [Uncomplicated lumbar puncture under fluoroscopic guidance, as detailed above.]

## 2014-12-28 NOTE — Progress Notes (Signed)
Pt  Ambulatory to the bathroom and out of the dept with her husband, no complaints

## 2014-12-28 NOTE — Progress Notes (Signed)
Pt arrives in Post-op from xray after Lumbar puncture. Pt laying flat, no complains at this time. Vitals stable. Per [facility] in [place] access to pt port before discharge, flush with saline and heparin.

## 2014-12-28 NOTE — Progress Notes (Signed)
Pt ambulatory to the bathroom ,Pt eating lunch, no complains

## 2014-12-28 NOTE — Progress Notes (Signed)
Port access removed, flushed with NS and Heparin. No bleeding noted

## 2014-12-31 ENCOUNTER — Encounter (HOSPITAL_BASED_OUTPATIENT_CLINIC_OR_DEPARTMENT_OTHER): Payer: 59

## 2014-12-31 ENCOUNTER — Encounter: Payer: Self-pay | Admitting: Radiation Therapy

## 2014-12-31 VITALS — BP 120/51 | HR 75 | Temp 98.0°F | Resp 16

## 2014-12-31 DIAGNOSIS — C7951 Secondary malignant neoplasm of bone: Secondary | ICD-10-CM | POA: Diagnosis not present

## 2014-12-31 DIAGNOSIS — C7931 Secondary malignant neoplasm of brain: Secondary | ICD-10-CM

## 2014-12-31 DIAGNOSIS — C787 Secondary malignant neoplasm of liver and intrahepatic bile duct: Secondary | ICD-10-CM | POA: Diagnosis not present

## 2014-12-31 DIAGNOSIS — C50919 Malignant neoplasm of unspecified site of unspecified female breast: Secondary | ICD-10-CM

## 2014-12-31 DIAGNOSIS — C50812 Malignant neoplasm of overlapping sites of left female breast: Secondary | ICD-10-CM

## 2014-12-31 MED ORDER — HEPARIN SOD (PORK) LOCK FLUSH 100 UNIT/ML IV SOLN
INTRAVENOUS | Status: AC
  Filled 2014-12-31: qty 5

## 2014-12-31 MED ORDER — HEPARIN SOD (PORK) LOCK FLUSH 100 UNIT/ML IV SOLN
500.0000 [IU] | Freq: Once | INTRAVENOUS | Status: AC
  Administered 2014-12-31: 500 [IU] via INTRAVENOUS

## 2014-12-31 MED ORDER — SODIUM CHLORIDE 0.9 % IV SOLN
INTRAVENOUS | Status: DC
  Administered 2014-12-31: 13:00:00 via INTRAVENOUS

## 2014-12-31 NOTE — Progress Notes (Signed)
Patient tolerated infusion well.  She was educated on her future scheduling and she verbalized understanding.

## 2014-12-31 NOTE — Progress Notes (Signed)
[name]'s recent  brain MRI was reviewed this morning during the [facility]. Dr. Isidore Moos was present for discussion and the scan was reviewed by neuroradiologist, Dr. Carlis Abbott.  The 12/24/14 MRI demonstrates decreased size in all treated brain metastases. No significant residual edema and no new lesions to explain her nausea. There is nothing to suggest leptomeningeal disease.   [name] had an LP on 8/19, the results were not back in time for discussion or at the time of this documentation. Dr. Lyndon Code from pathology was also present for this discussion. He made a note to check on this as well.   Armie has been started on dexamethasone to see if this will improve her symptoms. She is scheduled for follow-up with Dr. Whitney Muse Tuesday 8/23 to review.  This note has been routed to Dr. Pablo Ledger, Dr. Isidore Moos, Dr. Whitney Muse and Dr. Lyndon Code.   Mont Dutton Special Procedures Navigator

## 2015-01-01 ENCOUNTER — Ambulatory Visit (HOSPITAL_COMMUNITY): Payer: 59 | Admitting: Hematology & Oncology

## 2015-01-01 LAB — CSF CULTURE: CULTURE: NO GROWTH

## 2015-01-08 ENCOUNTER — Encounter (HOSPITAL_COMMUNITY): Payer: Self-pay | Admitting: Oncology

## 2015-01-08 ENCOUNTER — Encounter (HOSPITAL_BASED_OUTPATIENT_CLINIC_OR_DEPARTMENT_OTHER): Payer: 59 | Admitting: Oncology

## 2015-01-08 ENCOUNTER — Other Ambulatory Visit (HOSPITAL_COMMUNITY): Payer: Self-pay | Admitting: Oncology

## 2015-01-08 ENCOUNTER — Encounter: Payer: Self-pay | Admitting: *Deleted

## 2015-01-08 ENCOUNTER — Encounter (HOSPITAL_BASED_OUTPATIENT_CLINIC_OR_DEPARTMENT_OTHER): Payer: 59

## 2015-01-08 VITALS — BP 124/75 | HR 58 | Temp 97.7°F | Resp 16 | Wt 252.0 lb

## 2015-01-08 VITALS — BP 137/70 | HR 77 | Temp 98.0°F | Resp 18

## 2015-01-08 DIAGNOSIS — C7951 Secondary malignant neoplasm of bone: Secondary | ICD-10-CM | POA: Diagnosis not present

## 2015-01-08 DIAGNOSIS — C50919 Malignant neoplasm of unspecified site of unspecified female breast: Secondary | ICD-10-CM

## 2015-01-08 DIAGNOSIS — Z5112 Encounter for antineoplastic immunotherapy: Secondary | ICD-10-CM | POA: Diagnosis not present

## 2015-01-08 DIAGNOSIS — C50812 Malignant neoplasm of overlapping sites of left female breast: Secondary | ICD-10-CM | POA: Diagnosis not present

## 2015-01-08 DIAGNOSIS — C787 Secondary malignant neoplasm of liver and intrahepatic bile duct: Secondary | ICD-10-CM

## 2015-01-08 DIAGNOSIS — C229 Malignant neoplasm of liver, not specified as primary or secondary: Secondary | ICD-10-CM | POA: Diagnosis not present

## 2015-01-08 DIAGNOSIS — E876 Hypokalemia: Secondary | ICD-10-CM

## 2015-01-08 DIAGNOSIS — C7931 Secondary malignant neoplasm of brain: Secondary | ICD-10-CM

## 2015-01-08 LAB — CBC WITH DIFFERENTIAL/PLATELET
Basophils Absolute: 0 10*3/uL (ref 0.0–0.1)
Basophils Relative: 0 % (ref 0–1)
Eosinophils Absolute: 0 10*3/uL (ref 0.0–0.7)
Eosinophils Relative: 0 % (ref 0–5)
HCT: 40.5 % (ref 36.0–46.0)
Hemoglobin: 13.5 g/dL (ref 12.0–15.0)
Lymphocytes Relative: 5 % — ABNORMAL LOW (ref 12–46)
Lymphs Abs: 0.8 10*3/uL (ref 0.7–4.0)
MCH: 32 pg (ref 26.0–34.0)
MCHC: 33.3 g/dL (ref 30.0–36.0)
MCV: 96 fL (ref 78.0–100.0)
Monocytes Absolute: 0.9 10*3/uL (ref 0.1–1.0)
Monocytes Relative: 5 % (ref 3–12)
Neutro Abs: 16.2 10*3/uL — ABNORMAL HIGH (ref 1.7–7.7)
Neutrophils Relative %: 90 % — ABNORMAL HIGH (ref 43–77)
Platelets: 194 10*3/uL (ref 150–400)
RBC: 4.22 MIL/uL (ref 3.87–5.11)
RDW: 14.6 % (ref 11.5–15.5)
WBC: 17.8 10*3/uL — ABNORMAL HIGH (ref 4.0–10.5)

## 2015-01-08 LAB — COMPREHENSIVE METABOLIC PANEL
ALT: 27 U/L (ref 14–54)
AST: 18 U/L (ref 15–41)
Albumin: 4 g/dL (ref 3.5–5.0)
Alkaline Phosphatase: 31 U/L — ABNORMAL LOW (ref 38–126)
Anion gap: 12 (ref 5–15)
BUN: 20 mg/dL (ref 6–20)
CO2: 25 mmol/L (ref 22–32)
Calcium: 9.1 mg/dL (ref 8.9–10.3)
Chloride: 99 mmol/L — ABNORMAL LOW (ref 101–111)
Creatinine, Ser: 0.69 mg/dL (ref 0.44–1.00)
GFR calc Af Amer: >60 mL/min (ref >60)
GFR calc non Af Amer: >60 mL/min (ref >60)
Glucose, Bld: 132 mg/dL — ABNORMAL HIGH (ref 65–99)
Potassium: 3.3 mmol/L — ABNORMAL LOW (ref 3.5–5.1)
Sodium: 136 mmol/L (ref 135–145)
Total Bilirubin: 0.4 mg/dL (ref 0.3–1.2)
Total Protein: 6.8 g/dL (ref 6.5–8.1)

## 2015-01-08 MED ORDER — MORPHINE SULFATE ER 30 MG PO TBCR: 30.0000 mg | EXTENDED_RELEASE_TABLET | Freq: Two times a day (BID) | ORAL | Status: DC

## 2015-01-08 MED ORDER — SODIUM CHLORIDE 0.9 % IJ SOLN
10.0000 mL | INTRAMUSCULAR | Status: DC | PRN
  Administered 2015-01-08: 10 mL
  Filled 2015-01-08: qty 10

## 2015-01-08 MED ORDER — POTASSIUM CHLORIDE CRYS ER 20 MEQ PO TBCR: 20.0000 meq | EXTENDED_RELEASE_TABLET | Freq: Two times a day (BID) | ORAL | Status: DC

## 2015-01-08 MED ORDER — ZOLPIDEM TARTRATE 10 MG PO TABS: 10.0000 mg | ORAL_TABLET | Freq: Every evening | ORAL | Status: DC | PRN

## 2015-01-08 MED ORDER — HEPARIN SOD (PORK) LOCK FLUSH 100 UNIT/ML IV SOLN
500.0000 [IU] | Freq: Once | INTRAVENOUS | Status: AC | PRN
  Administered 2015-01-08: 500 [IU]
  Filled 2015-01-08: qty 5

## 2015-01-08 MED ORDER — TRASTUZUMAB CHEMO INJECTION 440 MG
6.0000 mg/kg | Freq: Once | INTRAVENOUS | Status: AC
  Administered 2015-01-08: 672 mg via INTRAVENOUS
  Filled 2015-01-08: qty 32

## 2015-01-08 MED ORDER — DEXAMETHASONE 4 MG PO TABS: 4.0000 mg | ORAL_TABLET | Freq: Every day | ORAL | Status: DC

## 2015-01-08 MED ORDER — SODIUM CHLORIDE 0.9 % IV SOLN
Freq: Once | INTRAVENOUS | Status: AC
  Administered 2015-01-08: 11:00:00 via INTRAVENOUS

## 2015-01-08 MED ORDER — ACETAMINOPHEN 325 MG PO TABS
650.0000 mg | ORAL_TABLET | Freq: Once | ORAL | Status: AC
  Administered 2015-01-08: 650 mg via ORAL
  Filled 2015-01-08: qty 2

## 2015-01-08 MED ORDER — DIPHENHYDRAMINE HCL 25 MG PO CAPS
50.0000 mg | ORAL_CAPSULE | Freq: Once | ORAL | Status: AC
  Administered 2015-01-08: 50 mg via ORAL
  Filled 2015-01-08: qty 2

## 2015-01-08 NOTE — Patient Instructions (Signed)
[facility] Discharge Instructions for Patients Receiving Chemotherapy  Today you received the following chemotherapy agents:  herceptin  If you develop nausea and vomiting, or diarrhea that is not controlled by your medication, call the clinic.  The clinic phone number is [phone]. Office hours are Monday-Friday 8:30am-5:00pm.  BELOW ARE SYMPTOMS THAT SHOULD BE REPORTED IMMEDIATELY:  *FEVER GREATER THAN 101.0 F  *CHILLS WITH OR WITHOUT FEVER  NAUSEA AND VOMITING THAT IS NOT CONTROLLED WITH YOUR NAUSEA MEDICATION  *UNUSUAL SHORTNESS OF BREATH  *UNUSUAL BRUISING OR BLEEDING  TENDERNESS IN MOUTH AND THROAT WITH OR WITHOUT PRESENCE OF ULCERS  *URINARY PROBLEMS  *BOWEL PROBLEMS  UNUSUAL RASH Items with * indicate a potential emergency and should be followed up as soon as possible. If you have an emergency after office hours please contact your primary care physician or go to the nearest emergency department.  Please call the clinic during office hours if you have any questions or concerns.   You may also contact the Patient Navigator at [phone] should you have any questions or need assistance in obtaining follow up care. _____________________________________________________________________ Have you asked about our STAR program?    STAR stands for Survivorship Training and Rehabilitation, and this is a nationally recognized cancer care program that focuses on survivorship and rehabilitation.  Cancer and cancer treatments may cause problems, such as, pain, making you feel tired and keeping you from doing the things that you need or want to do. Cancer rehabilitation can help. Our goal is to reduce these troubling effects and help you have the best quality of life possible.  You may receive a survey from a nurse that asks questions about your current state of health.  Based on the survey results, all eligible patients will be referred to the STAR program for  an evaluation so we can better serve you! A frequently asked questions sheet is available upon request.           

## 2015-01-08 NOTE — Patient Instructions (Addendum)
[place] [facility] Discharge Instructions  RECOMMENDATIONS MADE BY THE CONSULTANT AND ANY TEST RESULTS WILL BE SENT TO YOUR REFERRING PHYSICIAN.  Exam completed by Kirby Crigler  Call with any concerns. Herceptin today Prescriptions :  Decadron - decrease to 4 mg daily  MS contin30 mg every 12 hours as needed for pain  Ambien 10 mg at bedtime as needed for sleep Xgeva in 2 week Return in 3 weeks for chemotherapy and to see the doctor Please call the clinic if you have any questions or concerns  Thank you for choosing [place] at [facility] to provide your oncology and hematology care.  To afford each patient quality time with our provider, please arrive at least 15 minutes before your scheduled appointment time.    You need to re-schedule your appointment should you arrive 10 or more minutes late.  We strive to give you quality time with our providers, and arriving late affects you and other patients whose appointments are after yours.  Also, if you no show three or more times for appointments you may be dismissed from the clinic at the providers discretion.     Again, thank you for choosing [facility].  Our hope is that these requests will decrease the amount of time that you wait before being seen by our physicians.       _____________________________________________________________  Should you have questions after your visit to [facility], please contact our office at [phone] between the hours of 8:30 a.m. and 4:30 p.m.  Voicemails left after 4:30 p.m. will not be returned until the following business day.  For prescription refill requests, have your pharmacy contact our office.

## 2015-01-08 NOTE — Progress Notes (Signed)
Allison Labrum, MD [address] [place] [address]  Bone metastases - Plan: dexamethasone (DECADRON) 4 MG tablet, morphine (MS CONTIN) 30 MG 12 hr tablet, zolpidem (AMBIEN) 10 MG tablet, DISCONTINUED: dexamethasone (DECADRON) 4 MG tablet, DISCONTINUED: zolpidem (AMBIEN) 10 MG tablet, DISCONTINUED: morphine (MS CONTIN) 30 MG 12 hr tablet  Breast cancer, stage 4, unspecified laterality - Plan: dexamethasone (DECADRON) 4 MG tablet, morphine (MS CONTIN) 30 MG 12 hr tablet, zolpidem (AMBIEN) 10 MG tablet, DISCONTINUED: dexamethasone (DECADRON) 4 MG tablet, DISCONTINUED: zolpidem (AMBIEN) 10 MG tablet, DISCONTINUED: morphine (MS CONTIN) 30 MG 12 hr tablet  CURRENT THERAPY: Herceptin/Tamoxifen/XGEVA   INTERVAL HISTORY: [name] 49 y.o. female returns for followup of Stage IV adenocarcinoma with metastases to liver, bone, and brain.  I personally reviewed and went over laboratory results with the patient.  The results are noted within this dictation.  I personally reviewed and went over radiographic studies with the patient.  The results are noted within this dictation.   I personally reviewed and went over pathology results with the patient.  [name] reports a few issues: 1. Dizziness- she describes orthostatic hypotension.  I provided her education regarding this issue.  I have encouraged her to increase her PO fluid intake and take caution when initially standing.  She is appreciative of the education. 2. Increased vision issues- she reports increased blurriness and haziness that is corrected with her glasses.  She remains on 4 mg of Decadron BID since July 2016.  With this symptom and improved MRI of brain 2 weeks ago, I think it is reasonable to attempt a trial of Decadron at 4 mg daily.  We can always increase back to BID dosing at the first sign of trouble.  However, this may help with her symptoms. 3. "When can I drive?"- Not yet.  She asks about water aerobics and as along  as she is not alone and there is a Automotive engineer on duty, I think that would be a good form of exercise for her.  She has a few refills she requests.  "I feel so much better than I did last week!"  She reports that her nausea and fatigue are improved.  Her appetite is slowly improving.  These are all good signs.  She and [name] have gone to counseling and she is proud of the steps [name] has made.  He is an introvert and has a difficult time opening up to some recent challenges he has experienced in 2016.  She notes that he is improving and participating heavily in counseling.   She will be volunteering at the [facility] beginning on Thursday.  She is excited about this opportunity.    Past Medical History  Diagnosis Date  . PONV (postoperative nausea and vomiting)     pt states scope patch does well  . Anemia   . Depression   . Anxiety   . Headache(784.0)     has migraines - medication controls  . Family history of prostate cancer   . Hot flashes due to tamoxifen 09/25/2014  . Breast cancer 04/2014    Presumed dx; stage 4 w/ mets to bone and liver, brain lesions  . Bony metastasis   . S/P small bowel resection     has Hyperlipidemia; Depression; Breast cancer, stage 4; Bone metastases; DVT (deep venous thrombosis); GERD (gastroesophageal reflux disease); Family history of prostate cancer; Genetic testing; and Hot flashes due to tamoxifen on her problem list.  has No Known Allergies.  Current Outpatient Prescriptions on File Prior to Visit  Medication Sig Dispense Refill  . acetaminophen (TYLENOL) 325 MG tablet Take 650 mg by mouth every 6 (six) hours as needed for mild pain or fever.     . ALPRAZolam (XANAX) 0.5 MG tablet Take 0.5 mg by mouth 3 (three) times daily as needed for anxiety. May take 1 mg at night    . Calcium-Phosphorus-Vitamin D (CALCIUM/D3 ADULT GUMMIES PO) Take by mouth. Patient taking 4 gummies a day to equal her dosage of calcium 1200mg  and vitamin d 1000mg     .  diphenhydrAMINE (BENADRYL) 25 mg capsule Take 25 mg by mouth every 6 (six) hours as needed for itching.    . furosemide (LASIX) 20 MG tablet Take 1 tablet (20 mg total) by mouth daily. 30 tablet 1  . lidocaine-prilocaine (EMLA) cream Apply 1 application topically daily as needed (apply to port before chemo).     . meclizine (ANTIVERT) 25 MG tablet Take 1 tablet (25 mg total) by mouth every 6 (six) hours as needed for dizziness. 30 tablet 1  . Melatonin 3 MG TABS Take 6 mg by mouth Nightly.    . ondansetron (ZOFRAN) 8 MG tablet Take 1 tablet (8 mg total) by mouth every 8 (eight) hours as needed for nausea or vomiting. 30 tablet 1  . oxyCODONE (OXY IR/ROXICODONE) 5 MG immediate release tablet Take 1-2 tablets (5-10 mg total) by mouth every 4 (four) hours as needed. (Patient taking differently: Take 5-10 mg by mouth every 4 (four) hours as needed for moderate pain. ) 60 tablet 0  . pantoprazole (PROTONIX) 40 MG tablet Take 1 tablet (40 mg total) by mouth daily. 30 tablet 3  . prochlorperazine (COMPAZINE) 10 MG tablet Take 1 tablet (10 mg total) by mouth every 6 (six) hours as needed for nausea or vomiting. 30 tablet 3  . promethazine (PHENERGAN) 25 MG tablet Take 1 tablet (25 mg total) by mouth every 6 (six) hours as needed for nausea or vomiting. 30 tablet 1  . rivaroxaban (XARELTO) 20 MG TABS tablet Take 1 tablet (20 mg total) by mouth daily with supper. 30 tablet 6  . SUMAtriptan (IMITREX) 100 MG tablet Take 1 tablet (100 mg total) by mouth as needed. foir migraine 10 tablet 3  . tamoxifen (NOLVADEX) 20 MG tablet Take 1 tablet (20 mg total) by mouth daily. 30 tablet 2  . Trastuzumab (HERCEPTIN IV) Inject into the vein.    Marland Kitchen venlafaxine XR (EFFEXOR XR) 75 MG 24 hr capsule Take 2 capsules (150 mg total) by mouth daily with breakfast. 60 capsule 3   Current Facility-Administered Medications on File Prior to Visit  Medication Dose Route Frequency Provider Last Rate Last Dose  . sodium chloride 0.9 %  injection 10 mL  10 mL Intracatheter PRN Patrici Ranks, MD   10 mL at 01/08/15 1035    Past Surgical History  Procedure Laterality Date  . Right rotator cuff      2002  . Neck fusion      2003  . Laparoscopic cholecystectomy      2004  . Right knee arthroscopy      2005  . Appendectomy      2008  . Abdominal hysterectomy    . Breast reduction surgery  03/17/2011    Procedure: MAMMARY REDUCTION BILATERAL (BREAST);  Surgeon: Mary A Contogiannis;  Location: [facility];  Service: Plastics;  Laterality: Bilateral;  . Colonoscopy N/A  07/13/2013    Procedure: COLONOSCOPY;  Surgeon: Rogene Houston, MD;  [facility];  Service: Endoscopy;  Laterality: N/A;  930  . Liver biopsy  04/2014  . Portacath placement    . Esophagogastroduodenoscopy N/A 05/25/2014    Procedure: ESOPHAGOGASTRODUODENOSCOPY (EGD);  Surgeon: Rogene Houston, MD;  [facility];  Service: Endoscopy;  Laterality: N/A;  155  . Laparoscopic appendectomy    . Colonoscopy N/A 11/26/2014    Procedure: COLONOSCOPY;  Surgeon: Rogene Houston, MD;  [facility];  Service: Endoscopy;  Laterality: N/A;  730    Denies any headaches, dizziness, double vision, fevers, chills, night sweats, nausea, vomiting, diarrhea, constipation, chest pain, heart palpitations, shortness of breath, blood in stool, black tarry stool, urinary pain, urinary burning, urinary frequency, hematuria.   PHYSICAL EXAMINATION  ECOG PERFORMANCE STATUS: 1 - Symptomatic but completely ambulatory  Filed Vitals:   01/08/15 1041  BP: 137/70  Pulse: 77  Temp: 98 F (36.7 C)  Resp: 18    GENERAL:alert, no distress, well nourished, well developed, comfortable, cooperative, obese, smiling and unaccompanied until the end of visit when her sister arrived. SKIN: skin color, texture, turgor are normal, no rashes or significant lesions HEAD: Normocephalic, No masses, lesions, tenderness or abnormalities, alopecia EYES:  normal, PERRLA, EOMI, Conjunctiva are pink and non-injected EARS: External ears normal OROPHARYNX:lips, buccal mucosa, and tongue normal and mucous membranes are moist  NECK: supple, trachea midline LYMPH:  no palpable lymphadenopathy BREAST:not examined LUNGS: clear to auscultation  HEART: regular rate & rhythm ABDOMEN:abdomen soft and normal bowel sounds BACK: Back symmetric, no curvature. EXTREMITIES:less then 2 second capillary refill, no joint deformities, effusion, or inflammation, no skin discoloration  NEURO: alert & oriented x 3 with fluent speech, no focal motor/sensory deficits   LABORATORY DATA: CBC    Component Value Date/Time   WBC 17.8* 01/08/2015 1050   RBC 4.22 01/08/2015 1050   HGB 13.5 01/08/2015 1050   HCT 40.5 01/08/2015 1050   PLT 194 01/08/2015 1050   MCV 96.0 01/08/2015 1050   MCH 32.0 01/08/2015 1050   MCHC 33.3 01/08/2015 1050   RDW 14.6 01/08/2015 1050   LYMPHSABS 0.8 01/08/2015 1050   MONOABS 0.9 01/08/2015 1050   EOSABS 0.0 01/08/2015 1050   BASOSABS 0.0 01/08/2015 1050      Chemistry      Component Value Date/Time   NA 136 01/08/2015 1050   K 3.3* 01/08/2015 1050   CL 99* 01/08/2015 1050   CO2 25 01/08/2015 1050   BUN 20 01/08/2015 1050   CREATININE 0.69 01/08/2015 1050      Component Value Date/Time   CALCIUM 9.1 01/08/2015 1050   ALKPHOS 31* 01/08/2015 1050   AST 18 01/08/2015 1050   ALT 27 01/08/2015 1050   BILITOT 0.4 01/08/2015 1050        PENDING LABS:   RADIOGRAPHIC STUDIES:  Mr Jeri Cos Wo Contrast  12/24/2014   CLINICAL DATA:  Nausea, vomiting, and headache for 2 days. History of metastatic breast cancer. Fall at home.  EXAM: MRI HEAD WITHOUT AND WITH CONTRAST  TECHNIQUE: Multiplanar, multiecho pulse sequences of the brain and surrounding structures were obtained without and with intravenous contrast.  CONTRAST:  34mL MULTIHANCE GADOBENATE DIMEGLUMINE 529 MG/ML IV SOLN  COMPARISON:  11/20/2014  FINDINGS: A partially  empty sella is again noted and is unchanged. There is no evidence of acute infarct, intracranial hemorrhage, midline shift, or extra-axial fluid collection. Ventricles and sulci are  normal.  All of the enhancing brain metastases on the prior study have decreased in size in the interim, with many of the lesions no longer visible. The largest remaining lesion measures 5 mm in the right caudate (series 10, image 57, previously 10 mm). There is no significant residual edema associated with any of the lesions. No new lesions are identified.  Prior bilateral cataract extraction is noted. Paranasal sinuses and mastoid air cells are clear. Major intracranial vascular flow voids are preserved.  IMPRESSION: 1. Decreased size of all brain metastases. No significant residual edema. No new lesions identified. 2. No acute intracranial abnormality.   Electronically Signed   By: Logan Bores   On: 12/24/2014 11:43   Dg Fluoro Guide Lumbar Puncture  12/28/2014   CLINICAL DATA:  Treated brain metastasis.  Persistent headache.  EXAM: DIAGNOSTIC LUMBAR PUNCTURE UNDER FLUOROSCOPIC GUIDANCE  FLUOROSCOPY TIME:  Fluoroscopy Time (in minutes and seconds): 1 minutes and 36 seconds  PROCEDURE: Informed consent was obtained from the patient prior to the procedure, including potential complications of headache, allergy, and pain. With the patient prone, the lower back was prepped with Betadine. 1% Lidocaine was used for local anesthesia. Lumbar puncture was performed at the L3-4 level using a 20 gauge needle with return of clear CSF with an opening pressure of 27 cm water. 9 ml of CSF were obtained for laboratory studies. The patient tolerated the procedure well and there were no apparent complications.  IMPRESSION: Uncomplicated lumbar puncture under fluoroscopic guidance, as detailed above.   Electronically Signed   By: Abigail Miyamoto M.D.   On: 12/28/2014 11:41     PATHOLOGY:    ASSESSMENT AND PLAN:  Breast cancer, stage 4 Stage IV  adenocarcinoma with metastases to liver, bone, and brain.  Currently on Herceptin/Tamoxifen/XGEVA  Refills provided: 1. Ambien 10 mg at HS.  She notes that this medication in addition to Melatonin are very effective for her.  She is not interested in Ambien 12.5 mg.  Medication updated. 2. MS Contin 30 mg BID. 3. Decadron, at a reduced dose of 4 mg daily.  She notes some orthostatic hypotension and increased burry vision.  This may be the culprit.  Neurologically, she is stable with improved brain metastases according to MRI of brain a few weeks ago.  A trial of decreased dose of decadron is warranted.  We can increase to BID dosing at the first sign of an issue. 4. Kdur refilled based upon her hypokalemia noted today.  Xgeva due in 2 weeks.  Herceptin in 3 weeks  Return in 3 weeks for follow-up.    THERAPY PLAN:  Continue treatment as outlined above.  All questions were answered. The patient knows to call the clinic with any problems, questions or concerns. We can certainly see the patient much sooner if necessary.  Patient and plan discussed with Dr. Ancil Linsey and she is in agreement with the aforementioned.   This note is electronically signed by: Doy Mince 01/08/2015 4:33 PM

## 2015-01-08 NOTE — Progress Notes (Signed)
1245:  Tolerated infusion w/o adverse reaction.   A&Ox4; VSS.  Discharged in c/o sister for transport home.

## 2015-01-08 NOTE — Progress Notes (Signed)
[facility] Clinical Social Work  Clinical Social Work was referred by CSW rounding for assessment of psychosocial needs due to previous concerns with depression and adjustment to illness. Clinical Social Worker met with patient at [facility] during treatment to check in and offer support, assess for needs.  Pt shared she is feeling much better emotionally. Pt and husband have started counseling and this appears to be helping both of them cope with issues of grief and loss. She is considering volunteering at the [facility] and is taking the volunteer class later this week. Pt reports she was also visited by a friend she met at [facility] and found this very therapeutic as well. Pt considering attending Living with Cancer class at [facility] in the upcoming weeks as well. CSW feels pt making huge efforts to try and lift her spirits through activities, positive self-care and therapy. CSW to follow.   Clinical Social Work interventions: Supportive listening Resource education  [place] , LCSW [facility] Tuesdays   [phone]  

## 2015-01-08 NOTE — Assessment & Plan Note (Signed)
Stage IV adenocarcinoma with metastases to liver, bone, and brain.  Currently on Herceptin/Tamoxifen/XGEVA  Refills provided: 1. Ambien 10 mg at HS.  She notes that this medication in addition to Melatonin are very effective for her.  She is not interested in Ambien 12.5 mg.  Medication updated. 2. MS Contin 30 mg BID. 3. Decadron, at a reduced dose of 4 mg daily.  She notes some orthostatic hypotension and increased burry vision.  This may be the culprit.  Neurologically, she is stable with improved brain metastases according to MRI of brain a few weeks ago.  A trial of decreased dose of decadron is warranted.  We can increase to BID dosing at the first sign of an issue. 4. Kdur refilled based upon her hypokalemia noted today.  Xgeva due in 2 weeks.  Herceptin in 3 weeks  Return in 3 weeks for follow-up.

## 2015-01-09 LAB — CANCER ANTIGEN 27.29: CA 27.29: 34.3 U/mL (ref 0.0–38.6)

## 2015-01-22 ENCOUNTER — Encounter (HOSPITAL_COMMUNITY): Payer: Self-pay

## 2015-01-22 ENCOUNTER — Encounter (HOSPITAL_COMMUNITY): Payer: 59 | Attending: Hematology & Oncology

## 2015-01-22 VITALS — BP 129/76 | HR 73 | Temp 98.2°F | Resp 18

## 2015-01-22 DIAGNOSIS — C50812 Malignant neoplasm of overlapping sites of left female breast: Secondary | ICD-10-CM

## 2015-01-22 DIAGNOSIS — C7931 Secondary malignant neoplasm of brain: Secondary | ICD-10-CM | POA: Diagnosis not present

## 2015-01-22 DIAGNOSIS — C787 Secondary malignant neoplasm of liver and intrahepatic bile duct: Secondary | ICD-10-CM | POA: Diagnosis not present

## 2015-01-22 DIAGNOSIS — C50919 Malignant neoplasm of unspecified site of unspecified female breast: Secondary | ICD-10-CM

## 2015-01-22 DIAGNOSIS — C7951 Secondary malignant neoplasm of bone: Secondary | ICD-10-CM | POA: Insufficient documentation

## 2015-01-22 DIAGNOSIS — C229 Malignant neoplasm of liver, not specified as primary or secondary: Secondary | ICD-10-CM | POA: Insufficient documentation

## 2015-01-22 MED ORDER — DENOSUMAB 120 MG/1.7ML ~~LOC~~ SOLN
120.0000 mg | Freq: Once | SUBCUTANEOUS | Status: AC
  Administered 2015-01-22: 120 mg via SUBCUTANEOUS
  Filled 2015-01-22: qty 1.7

## 2015-01-22 NOTE — Progress Notes (Signed)
[name]'s reason for visit today is for an injection  as scheduled per MD orders. [name] also received x-geva per MD orders; see MAR for administration details.  [name] tolerated all procedures well and without incident; questions were answered and patient was discharged.  

## 2015-01-22 NOTE — Patient Instructions (Signed)
[place] at [facility] Discharge Instructions  RECOMMENDATIONS MADE BY THE CONSULTANT AND ANY TEST RESULTS WILL BE SENT TO YOUR REFERRING PHYSICIAN.  X-geva monthly Return next week to see the doctor and for chemotherapy Please call the clinic if you have any questions or concerns  Thank you for choosing [place] at [facility] to provide your oncology and hematology care.  To afford each patient quality time with our provider, please arrive at least 15 minutes before your scheduled appointment time.    You need to re-schedule your appointment should you arrive 10 or more minutes late.  We strive to give you quality time with our providers, and arriving late affects you and other patients whose appointments are after yours.  Also, if you no show three or more times for appointments you may be dismissed from the clinic at the providers discretion.     Again, thank you for choosing [facility].  Our hope is that these requests will decrease the amount of time that you wait before being seen by our physicians.       _____________________________________________________________  Should you have questions after your visit to [facility], please contact our office at [phone] between the hours of 8:30 a.m. and 4:30 p.m.  Voicemails left after 4:30 p.m. will not be returned until the following business day.  For prescription refill requests, have your pharmacy contact our office.

## 2015-01-23 ENCOUNTER — Other Ambulatory Visit (HOSPITAL_COMMUNITY): Payer: Self-pay | Admitting: Oncology

## 2015-01-23 ENCOUNTER — Encounter (HOSPITAL_COMMUNITY): Payer: 59

## 2015-01-23 DIAGNOSIS — C50919 Malignant neoplasm of unspecified site of unspecified female breast: Secondary | ICD-10-CM

## 2015-01-23 DIAGNOSIS — R3915 Urgency of urination: Secondary | ICD-10-CM

## 2015-01-23 DIAGNOSIS — C7951 Secondary malignant neoplasm of bone: Secondary | ICD-10-CM | POA: Diagnosis not present

## 2015-01-23 DIAGNOSIS — C229 Malignant neoplasm of liver, not specified as primary or secondary: Secondary | ICD-10-CM | POA: Diagnosis present

## 2015-01-23 LAB — URINALYSIS, ROUTINE W REFLEX MICROSCOPIC
BILIRUBIN URINE: NEGATIVE
GLUCOSE, UA: NEGATIVE mg/dL
Ketones, ur: NEGATIVE mg/dL
Nitrite: NEGATIVE
PH: 6 (ref 5.0–8.0)
Protein, ur: NEGATIVE mg/dL
SPECIFIC GRAVITY, URINE: 1.01 (ref 1.005–1.030)
Urobilinogen, UA: 0.2 mg/dL (ref 0.0–1.0)

## 2015-01-23 LAB — URINE MICROSCOPIC-ADD ON

## 2015-01-23 MED ORDER — SULFAMETHOXAZOLE-TRIMETHOPRIM 800-160 MG PO TABS: 1.0000 | ORAL_TABLET | Freq: Two times a day (BID) | ORAL | Status: DC

## 2015-01-24 NOTE — Progress Notes (Signed)
Ua tested

## 2015-01-29 ENCOUNTER — Encounter (HOSPITAL_BASED_OUTPATIENT_CLINIC_OR_DEPARTMENT_OTHER): Payer: 59

## 2015-01-29 ENCOUNTER — Encounter: Payer: Self-pay | Admitting: *Deleted

## 2015-01-29 ENCOUNTER — Encounter (HOSPITAL_COMMUNITY): Payer: Self-pay | Admitting: Hematology & Oncology

## 2015-01-29 ENCOUNTER — Ambulatory Visit (HOSPITAL_COMMUNITY): Payer: 59 | Admitting: Hematology & Oncology

## 2015-01-29 ENCOUNTER — Encounter (HOSPITAL_BASED_OUTPATIENT_CLINIC_OR_DEPARTMENT_OTHER): Payer: 59 | Admitting: Hematology & Oncology

## 2015-01-29 VITALS — BP 134/67 | HR 71 | Temp 97.6°F | Resp 18

## 2015-01-29 VITALS — BP 127/73 | HR 72 | Temp 98.3°F | Resp 18 | Wt 264.4 lb

## 2015-01-29 DIAGNOSIS — C7951 Secondary malignant neoplasm of bone: Secondary | ICD-10-CM

## 2015-01-29 DIAGNOSIS — C50812 Malignant neoplasm of overlapping sites of left female breast: Secondary | ICD-10-CM | POA: Diagnosis not present

## 2015-01-29 DIAGNOSIS — C7931 Secondary malignant neoplasm of brain: Secondary | ICD-10-CM | POA: Insufficient documentation

## 2015-01-29 DIAGNOSIS — C229 Malignant neoplasm of liver, not specified as primary or secondary: Secondary | ICD-10-CM | POA: Diagnosis not present

## 2015-01-29 DIAGNOSIS — C50919 Malignant neoplasm of unspecified site of unspecified female breast: Secondary | ICD-10-CM | POA: Diagnosis not present

## 2015-01-29 DIAGNOSIS — Z5112 Encounter for antineoplastic immunotherapy: Secondary | ICD-10-CM

## 2015-01-29 DIAGNOSIS — C787 Secondary malignant neoplasm of liver and intrahepatic bile duct: Secondary | ICD-10-CM

## 2015-01-29 LAB — COMPREHENSIVE METABOLIC PANEL
ALT: 37 U/L (ref 14–54)
ANION GAP: 6 (ref 5–15)
AST: 21 U/L (ref 15–41)
Albumin: 3.7 g/dL (ref 3.5–5.0)
Alkaline Phosphatase: 25 U/L — ABNORMAL LOW (ref 38–126)
BUN: 27 mg/dL — ABNORMAL HIGH (ref 6–20)
CHLORIDE: 102 mmol/L (ref 101–111)
CO2: 28 mmol/L (ref 22–32)
Calcium: 8.8 mg/dL — ABNORMAL LOW (ref 8.9–10.3)
Creatinine, Ser: 0.9 mg/dL (ref 0.44–1.00)
Glucose, Bld: 100 mg/dL — ABNORMAL HIGH (ref 65–99)
Potassium: 3.5 mmol/L (ref 3.5–5.1)
SODIUM: 136 mmol/L (ref 135–145)
Total Bilirubin: 0.6 mg/dL (ref 0.3–1.2)
Total Protein: 6.1 g/dL — ABNORMAL LOW (ref 6.5–8.1)

## 2015-01-29 LAB — CBC WITH DIFFERENTIAL/PLATELET
BASOS ABS: 0 10*3/uL (ref 0.0–0.1)
Basophils Relative: 0 %
EOS ABS: 0.1 10*3/uL (ref 0.0–0.7)
Eosinophils Relative: 1 %
HCT: 37.6 % (ref 36.0–46.0)
Hemoglobin: 12.2 g/dL (ref 12.0–15.0)
LYMPHS ABS: 1.4 10*3/uL (ref 0.7–4.0)
LYMPHS PCT: 17 %
MCH: 32.5 pg (ref 26.0–34.0)
MCHC: 32.4 g/dL (ref 30.0–36.0)
MCV: 100.3 fL — AB (ref 78.0–100.0)
MONO ABS: 0.4 10*3/uL (ref 0.1–1.0)
Monocytes Relative: 5 %
NEUTROS ABS: 6.4 10*3/uL (ref 1.7–7.7)
Neutrophils Relative %: 77 %
PLATELETS: 154 10*3/uL (ref 150–400)
RBC: 3.75 MIL/uL — ABNORMAL LOW (ref 3.87–5.11)
RDW: 15.8 % — AB (ref 11.5–15.5)
WBC: 8.3 10*3/uL (ref 4.0–10.5)

## 2015-01-29 MED ORDER — HEPARIN SOD (PORK) LOCK FLUSH 100 UNIT/ML IV SOLN
500.0000 [IU] | Freq: Once | INTRAVENOUS | Status: AC | PRN
  Administered 2015-01-29: 500 [IU]
  Filled 2015-01-29: qty 5

## 2015-01-29 MED ORDER — ACETAMINOPHEN 325 MG PO TABS: 650.0000 mg | ORAL_TABLET | Freq: Once | ORAL | Status: DC

## 2015-01-29 MED ORDER — DIPHENHYDRAMINE HCL 25 MG PO CAPS
50.0000 mg | ORAL_CAPSULE | Freq: Once | ORAL | Status: AC
  Administered 2015-01-29: 50 mg via ORAL
  Filled 2015-01-29: qty 2

## 2015-01-29 MED ORDER — TRASTUZUMAB CHEMO INJECTION 440 MG
6.0000 mg/kg | Freq: Once | INTRAVENOUS | Status: AC
  Administered 2015-01-29: 672 mg via INTRAVENOUS
  Filled 2015-01-29: qty 32

## 2015-01-29 MED ORDER — SODIUM CHLORIDE 0.9 % IJ SOLN: 10.0000 mL | INTRAMUSCULAR | Status: DC | PRN

## 2015-01-29 MED ORDER — SODIUM CHLORIDE 0.9 % IV SOLN
Freq: Once | INTRAVENOUS | Status: AC
  Administered 2015-01-29: 12:00:00 via INTRAVENOUS

## 2015-01-29 NOTE — Progress Notes (Signed)
[name], MD [place] [id number]    Breast cancer, stage 4   04/25/2014 Initial Diagnosis Breast cancer, stage 4   04/25/2014 Imaging CT abdomen/pelvia with widespread metastatic disease to the liver, multiple lytic lesions throughout spine and pelvis. No FX or epidural tumor identified   04/26/2014 Imaging CT head unremarkable   04/26/2014 Imaging CT chest with no lung mass or pulmonary nodules, no adenopathy. Lytic bone lesions, right 2nd rib   04/27/2014 Initial Biopsy U/S guided liver biopsy, lesion in anterior and inferior left hepatic lobe biopsied   04/27/2014 Pathology Results Metastatic adenocarcinoma, CK7, ER+, patchy positivity with PR. Possible primary includes breast, less likely gynecologic   05/15/2014 Mammogram BI-RADS CATEGORY  2: Benign Finding(s)   05/16/2014 PET scan 1. Intensely hypermetabolic hepatic metastasis. 2. Widespread hypermetabolic skeletal lesions. 3. No primary adenocarcinoma identified by FDG PET imaging.   05/19/2014 Imaging MUGA- Left ventricular ejection fracture greater than 70%.   05/21/2014 Breast MRI No suspicious masses or enhancement within the breasts. No axillary adenopathy.   05/22/2014 - 07/03/2014 Antibody Plan Herceptin/Perjeta/Tamoxifen   06/12/2014 - 07/03/2014 Chemotherapy Taxotere added secondary to persistent abdominal and back pain   06/17/2014 - 06/19/2014 Hospital Admission Neutropenia, fever, diarrhea, nausea, vomiting   06/20/2014 - 07/10/2014 Radiation Therapy Dr. Thea Silversmith 12 fractions to L3-S3 (30 Gy) and left scapula (20 Gy).    07/03/2014 Adverse Reaction Perjeta- induced diarrhea.  Perjeta discontinued   07/16/2014 - 07/20/2014 Hospital Admission Electrolyte abnormalities, and diarrhea.  Suspect Perjeta-induced diarrhea.  Negative GI work-up.   07/24/2014 -  Chemotherapy Herceptin/Tamoxifen/Xgeva   08/21/2014 Imaging MUGA- Left ventricular ejection fraction equals 71%.   08/24/2014 PET scan Dramatic reduction in metabolic  activity of the widespread liver metastasis. Liver metastasis now have metabolic activity equal to background normal liver activity. Liver has a nodular contour. Marked reduction in metabolic activity of skeletal lesions..   10/05/2014 Progression Widespread metastatic disease to the brain as described. Between 20 and 30 intracranial metastatic deposits are now seen. No midline shift or incipient herniation   10/09/2014 - 10/26/2014 Radiation Therapy Whole Brain XRT    Stage IV adenocarcinoma with metastases to liver and bone  Had last mammogram in [place] this year, Dr. Gaetano Net  Colonoscopy 05/18/2013  Breast Reduction 03/17/2011  CURRENT THERAPY: Herceptin/Tamoxifen/XGEVA   INTERVAL HISTORY: [name] 49 y.o. female returns for follow-up of stage IV adenocarcinoma of the breast, ER+, HER 2 + disease. She continues on Tamoxifen, Herceptin and XGEVA.  The patent has been doing well.  She is being extremely positive about her medical situation stating that she has realized that she has been "chosen for a reason" to go through her process.  Her mother notes that the patients mood has definitely improved.   The patient has taken one beach vacation and has another planned for October 9 with her family.  She plans on going on a Cruise for her birthday in January.    She has started taking melatonin, her sleep overall is markedly improved.  She complains of occasional headaches.  She notes that her vision is bad.   The patient has concerns of infrequent "small seizure-like" spells that occur after she has been stationary and then gets up to do small activities such as go to the refrigerator or adjust her air conditioning. She feels as if she will faint and lays her head down on her countertop and attempts to remain calm.  Dizziness occurs.  She  is conscious and alert while this is occurring.  She notes that this only happens at home and never while she is out or while driving.  She is home alone  often.  Her sister lives about 2 minutes away. She states she tries to remember to get up slowly.  Her appetite is well.  The patient notes that she no longer panics about her weight gain because she knows how important it is for her to have a normal appetite.   She takes one steroid per day.  The patient has recently been to a support group.  She notes that she enjoyed being able to talk to other people who are "just like her".  She questions whether or not she should still have her ovaries.   Next week she will begin volunteering with Hospice.    She is here today for herceptin.  MEDICAL HISTORY: Past Medical History  Diagnosis Date  . PONV (postoperative nausea and vomiting)     pt states scope patch does well  . Anemia   . Depression   . Anxiety   . Headache(784.0)     has migraines - medication controls  . Family history of prostate cancer   . Hot flashes due to tamoxifen 09/25/2014  . Breast cancer 04/2014    Presumed dx; stage 4 w/ mets to bone and liver, brain lesions  . Bony metastasis   . S/P small bowel resection     has Hyperlipidemia; Depression; Breast cancer, stage 4; Bone metastases; DVT (deep venous thrombosis); GERD (gastroesophageal reflux disease); Family history of prostate cancer; Genetic testing; Hot flashes due to tamoxifen; and Brain metastases on her problem list.      has No Known Allergies.  Ms. Dike does not currently have medications on file.  SURGICAL HISTORY: Past Surgical History  Procedure Laterality Date  . Right rotator cuff      2002  . Neck fusion      2003  . Laparoscopic cholecystectomy      2004  . Right knee arthroscopy      2005  . Appendectomy      2008  . Abdominal hysterectomy    . Breast reduction surgery  03/17/2011    Procedure: MAMMARY REDUCTION BILATERAL (BREAST);  Surgeon: Mary A Contogiannis;  Location: [facility];  Service: Plastics;  Laterality: Bilateral;  . Colonoscopy N/A 07/13/2013    Procedure:  COLONOSCOPY;  Surgeon: Rogene Houston, MD;  [facility];  Service: Endoscopy;  Laterality: N/A;  930  . Liver biopsy  04/2014  . Portacath placement    . Esophagogastroduodenoscopy N/A 05/25/2014    Procedure: ESOPHAGOGASTRODUODENOSCOPY (EGD);  Surgeon: Rogene Houston, MD;  [facility];  Service: Endoscopy;  Laterality: N/A;  155  . Laparoscopic appendectomy    . Colonoscopy N/A 11/26/2014    Procedure: COLONOSCOPY;  Surgeon: Rogene Houston, MD;  [facility];  Service: Endoscopy;  Laterality: N/A;  730    SOCIAL HISTORY: Social History   Social History  . Marital Status: Married    Spouse Name: N/A  . Number of Children: N/A  . Years of Education: N/A   Occupational History  . Not on file.   Social History Main Topics  . Smoking status: Former Smoker    Types: Cigarettes    Quit date: 08/20/1994  . Smokeless tobacco: Never Used  . Alcohol Use: Yes     Comment: Occasionally  . Drug Use: No  .  Sexual Activity: Yes    Birth Control/ Protection: Surgical   Other Topics Concern  . Not on file   Social History Narrative    FAMILY HISTORY: Family History  Problem Relation Age of Onset  . Diabetes Father   . Heart attack Maternal Grandmother 30    multiple over lifetime.  . Cancer Maternal Grandmother 85    NOS  . Prostate cancer Maternal Grandfather     dx in his 78s  . Lung cancer Paternal Grandfather     dx <50  . Lymphoma Maternal Aunt     dx in her 82s  . Melanoma Cousin 43    maternal first cousin  . Brain cancer Cousin     paternal first cousin dx under 79  . Prostate cancer Other     MGF's father  . Colon cancer Other     MGM's mother  Her son lives in [place]. Her daughter lives in [place].  Review of Systems  Constitutional: Positive for malaise/fatigue. Negative for fever, chills, weight loss.  HENT: . Negative for congestion, hearing loss, sore throat and tinnitus.   Eyes: Positive for blurred vision.  Negative for double vision, pain and discharge.  Respiratory:  Negative for cough, hemoptysis, sputum production, shortness of breath and wheezing.   Cardiovascular: Negative for chest pain, palpitations, claudication, and PND.  Gastrointestinal: Negative for heartburn, abdominal pain, diarrhea, constipation, blood in stool and melena, nausea, vomiting. Genitourinary:Negative for dysuria, urgency, frequency and hematuria.  Musculoskeletal: Negative for myalgias, joint pain and falls.  Skin: Negative for itching and rash.  Neurological: . Negative for tingling, tremors, speech change, focal weakness, seizures, loss of consciousness, weakness. Headaches or blurry vision Endo/Heme/Allergies: Does not bruise/bleed easily.  Psychiatric/Behavioral: Negative for suicidal ideas, memory loss and substance abuse, nervous/axiety, depression. 14 point review of systems was performed and is negative except as detailed under history of present illness and above   PHYSICAL EXAMINATION  ECOG PERFORMANCE STATUS: 1 - Symptomatic but completely ambulatory  Filed Vitals:   01/29/15 1050  BP: 127/73  Pulse: 72  Temp: 98.3 F (36.8 C)  Resp: 18    Physical Exam  Constitutional: She is oriented to person, place, and time and well-developed, well-nourished, and in no distress. Facial Cushingoid features  Mood is good HENT:  Head: Normocephalic and atraumatic. Alopecia Nose: Nose normal.  Mouth/Throat:  Negative Eyes: Conjunctivae and EOM are normal. Pupils are equal, round, and reactive to light. Right eye exhibits no discharge. Left eye exhibits no discharge. No scleral icterus.  Neck: Normal range of motion. No tracheal deviation present. No thyromegaly present.  Cardiovascular: Normal rate, regular rhythm and normal heart sounds.  Exam reveals no gallop and no friction rub.   No murmur heard. Pulmonary/Chest: Effort normal and breath sounds normal. She has no wheezes. She has no rales.  Abdominal:  Soft. Bowel sounds are normal. She exhibits no distension and no mass. There is no tenderness. There is no rebound and no guarding.  Musculoskeletal: Normal range of motion.  Lymphadenopathy:    She has no cervical adenopathy.  Neurological: She is alert and oriented to person, place, and time. She has normal reflexes. No cranial nerve deficit. Gait normal. Coordination normal.  Skin: Skin is warm and dry. No rash noted.  Psychiatric: Mood, memory, affect and judgment normal.  Nursing note and vitals reviewed.  LABORATORY DATA: I have reviewed the data as listed.  CBC    Component Value Date/Time   WBC 8.3 01/29/2015  1113   RBC 3.75* 01/29/2015 1113   HGB 12.2 01/29/2015 1113   HCT 37.6 01/29/2015 1113   PLT 154 01/29/2015 1113   MCV 100.3* 01/29/2015 1113   MCH 32.5 01/29/2015 1113   MCHC 32.4 01/29/2015 1113   RDW 15.8* 01/29/2015 1113   LYMPHSABS PENDING 01/29/2015 1113   MONOABS PENDING 01/29/2015 1113   EOSABS PENDING 01/29/2015 1113   BASOSABS PENDING 01/29/2015 1113   CMP     Component Value Date/Time   NA 136 01/29/2015 1113   K 3.5 01/29/2015 1113   CL 102 01/29/2015 1113   CO2 28 01/29/2015 1113   GLUCOSE 100* 01/29/2015 1113   BUN 27* 01/29/2015 1113   CREATININE 0.90 01/29/2015 1113   CALCIUM 8.8* 01/29/2015 1113   PROT 6.1* 01/29/2015 1113   ALBUMIN 3.7 01/29/2015 1113   AST 21 01/29/2015 1113   ALT 37 01/29/2015 1113   ALKPHOS 25* 01/29/2015 1113   BILITOT 0.6 01/29/2015 1113   GFRNONAA >60 01/29/2015 1113   GFRAA >60 01/29/2015 1113   RADIOLOGY: CLINICAL DATA: Nausea, vomiting, and headache for 2 days. History of metastatic breast cancer. Fall at home.  EXAM: MRI HEAD WITHOUT AND WITH CONTRAST  TECHNIQUE: Multiplanar, multiecho pulse sequences of the brain and surrounding structures were obtained without and with intravenous contrast.  CONTRAST: 39m MULTIHANCE GADOBENATE DIMEGLUMINE 529 MG/ML IV SOLN  COMPARISON:  11/20/2014 IMPRESSION: 1. Decreased size of all brain metastases. No significant residual edema. No new lesions identified. 2. No acute intracranial abnormality.   Electronically Signed  By: ALogan Bores On: 12/24/2014 11:43   ASSESSMENT and THERAPY PLAN:  Stage IV ER positive, HER-2 positive carcinoma of the breast Widespread Brain Metastases End of Life issues Insomnia Anxiety Nausea/vomiting   Caileigh is doing much better. She and her husband are currently in counseling and she notes that it is helping a lot. Her husband has been opening up about issues pertaining to her illness and also his daughter's recent death.  The patient is planning on volunteering at the hospice house. She has been spending time with her family. She has been sleeping better. Headaches are much improved. She continues on dexamethasone 1/2-1 tablet daily.  She would like to move her next Herceptin dose as she will be at the beach. We will schedule her to come in several days early for treatment. In addition she has an upcoming MUGA due, order this for her today.  I have advised her that if her "dizzy" spells worsen to let uKoreaknow. Her last head MRI was approximately 1 month ago.  All questions were answered. The patient knows to call the clinic with any problems, questions or concerns. We can certainly see the patient much sooner if necessary.   This note was signed electronically  This document serves as a record of services personally performed by SAncil Linsey MD. It was created on her behalf by DJanace Hoard a trained medical scribe. The creation of this record is based on the scribe's personal observations and the provider's statements to them. This document has been checked and approved by the attending provider.  I have reviewed the above documentation for accuracy and completeness, and I agree with the above.  SKelby Fam PWhitney Muse MD

## 2015-01-29 NOTE — Patient Instructions (Signed)
[place] at [facility] Discharge Instructions  RECOMMENDATIONS MADE BY THE CONSULTANT AND ANY TEST RESULTS WILL BE SENT TO YOUR REFERRING PHYSICIAN.  Exam and discussion by Dr. Whitney Muse. Will continue current regimen. Muga to be scheduled. Report any new lumps, bone pain, shortness of breath or other symptoms. Follow-up in weeks with office visit and chemotherapy  Thank you for choosing [place] at [facility] to provide your oncology and hematology care.  To afford each patient quality time with our provider, please arrive at least 15 minutes before your scheduled appointment time.    You need to re-schedule your appointment should you arrive 10 or more minutes late.  We strive to give you quality time with our providers, and arriving late affects you and other patients whose appointments are after yours.  Also, if you no show three or more times for appointments you may be dismissed from the clinic at the providers discretion.     Again, thank you for choosing [facility].  Our hope is that these requests will decrease the amount of time that you wait before being seen by our physicians.       _____________________________________________________________  Should you have questions after your visit to [facility], please contact our office at [phone] between the hours of 8:30 a.m. and 4:30 p.m.  Voicemails left after 4:30 p.m. will not be returned until the following business day.  For prescription refill requests, have your pharmacy contact our office.

## 2015-01-29 NOTE — Progress Notes (Signed)
[place] Clinical Social Work  Clinical Social Work met with pt during treatment to reassess needs and check in. Pt here today with her mother and also visiting with chaplain, Luz Brazen. Pt completed volunteer training and is eager to connect with other patients through a creative group. Pt continues to be upbeat and able to discuss current concerns. She and Thayer Jew, husband are returning to counseling this week and our looking forward to this. Pt denies current concerns and is looking forward to a beach trip and also Living with Cancer Class at [facility]. CSW to follow.    Clinical Social Work interventions: Reassess needs Supportive listening  [place], [place] Tuesdays   [phone]

## 2015-01-29 NOTE — Progress Notes (Signed)
[name] Tolerated chemotherapy well today.  Discharged ambulatory 

## 2015-01-29 NOTE — Patient Instructions (Signed)
[facility] Discharge Instructions for Patients Receiving Chemotherapy  Today you received the following chemotherapy agents herceptin follow up as scheduled Please call the clinic if you have any questions or concerns  To help prevent nausea and vomiting after your treatment, we encourage you to take your nausea medication    If you develop nausea and vomiting, or diarrhea that is not controlled by your medication, call the clinic.  The clinic phone number is [phone]. Office hours are Monday-Friday 8:30am-5:00pm.  BELOW ARE SYMPTOMS THAT SHOULD BE REPORTED IMMEDIATELY:  *FEVER GREATER THAN 101.0 F  *CHILLS WITH OR WITHOUT FEVER  NAUSEA AND VOMITING THAT IS NOT CONTROLLED WITH YOUR NAUSEA MEDICATION  *UNUSUAL SHORTNESS OF BREATH  *UNUSUAL BRUISING OR BLEEDING  TENDERNESS IN MOUTH AND THROAT WITH OR WITHOUT PRESENCE OF ULCERS  *URINARY PROBLEMS  *BOWEL PROBLEMS  UNUSUAL RASH Items with * indicate a potential emergency and should be followed up as soon as possible. If you have an emergency after office hours please contact your primary care physician or go to the nearest emergency department.  Please call the clinic during office hours if you have any questions or concerns.   You may also contact the Patient Navigator at [phone] should you have any questions or need assistance in obtaining follow up care. _____________________________________________________________________ Have you asked about our STAR program?    STAR stands for Survivorship Training and Rehabilitation, and this is a nationally recognized cancer care program that focuses on survivorship and rehabilitation.  Cancer and cancer treatments may cause problems, such as, pain, making you feel tired and keeping you from doing the things that you need or want to do. Cancer rehabilitation can help. Our goal is to reduce these troubling effects and help you have the best quality of life  possible.  You may receive a survey from a nurse that asks questions about your current state of health.  Based on the survey results, all eligible patients will be referred to the [facility] program for an evaluation so we can better serve you! A frequently asked questions sheet is available upon request.

## 2015-01-30 ENCOUNTER — Other Ambulatory Visit (HOSPITAL_COMMUNITY): Payer: Self-pay

## 2015-01-30 DIAGNOSIS — C50919 Malignant neoplasm of unspecified site of unspecified female breast: Secondary | ICD-10-CM

## 2015-01-31 ENCOUNTER — Other Ambulatory Visit (HOSPITAL_COMMUNITY): Payer: Self-pay | Admitting: Oncology

## 2015-02-06 ENCOUNTER — Other Ambulatory Visit (HOSPITAL_COMMUNITY): Payer: Self-pay | Admitting: Emergency Medicine

## 2015-02-06 ENCOUNTER — Encounter (HOSPITAL_BASED_OUTPATIENT_CLINIC_OR_DEPARTMENT_OTHER): Payer: 59

## 2015-02-06 ENCOUNTER — Other Ambulatory Visit (HOSPITAL_COMMUNITY): Payer: Self-pay | Admitting: Oncology

## 2015-02-06 DIAGNOSIS — C50919 Malignant neoplasm of unspecified site of unspecified female breast: Secondary | ICD-10-CM

## 2015-02-06 DIAGNOSIS — C229 Malignant neoplasm of liver, not specified as primary or secondary: Secondary | ICD-10-CM | POA: Diagnosis not present

## 2015-02-06 DIAGNOSIS — C7951 Secondary malignant neoplasm of bone: Secondary | ICD-10-CM

## 2015-02-06 DIAGNOSIS — C7931 Secondary malignant neoplasm of brain: Secondary | ICD-10-CM

## 2015-02-06 DIAGNOSIS — C50212 Malignant neoplasm of upper-inner quadrant of left female breast: Secondary | ICD-10-CM | POA: Diagnosis not present

## 2015-02-06 DIAGNOSIS — C787 Secondary malignant neoplasm of liver and intrahepatic bile duct: Secondary | ICD-10-CM | POA: Diagnosis not present

## 2015-02-06 LAB — CBC WITH DIFFERENTIAL/PLATELET
Basophils Absolute: 0 10*3/uL (ref 0.0–0.1)
Basophils Relative: 0 %
EOS ABS: 0 10*3/uL (ref 0.0–0.7)
EOS PCT: 0 %
HCT: 37.9 % (ref 36.0–46.0)
Hemoglobin: 12.4 g/dL (ref 12.0–15.0)
LYMPHS ABS: 1 10*3/uL (ref 0.7–4.0)
Lymphocytes Relative: 12 %
MCH: 33 pg (ref 26.0–34.0)
MCHC: 32.7 g/dL (ref 30.0–36.0)
MCV: 100.8 fL — ABNORMAL HIGH (ref 78.0–100.0)
MONOS PCT: 6 %
Monocytes Absolute: 0.6 10*3/uL (ref 0.1–1.0)
Neutro Abs: 7.2 10*3/uL (ref 1.7–7.7)
Neutrophils Relative %: 82 %
PLATELETS: 166 10*3/uL (ref 150–400)
RBC: 3.76 MIL/uL — ABNORMAL LOW (ref 3.87–5.11)
RDW: 15.1 % (ref 11.5–15.5)
WBC: 8.8 10*3/uL (ref 4.0–10.5)

## 2015-02-06 LAB — GLUCOSE, RANDOM: GLUCOSE: 94 mg/dL (ref 65–99)

## 2015-02-06 MED ORDER — MORPHINE SULFATE ER 30 MG PO TBCR: 30.0000 mg | EXTENDED_RELEASE_TABLET | Freq: Two times a day (BID) | ORAL | Status: DC

## 2015-02-06 NOTE — Progress Notes (Signed)
Labs drawn, smear made. Added glu per Dr. Whitney Muse

## 2015-02-13 ENCOUNTER — Encounter (HOSPITAL_COMMUNITY)
Admission: RE | Admit: 2015-02-13 | Discharge: 2015-02-13 | Disposition: A | Discharge status: Home / Self Care | Payer: 59 | Source: Ambulatory Visit | Attending: Hematology & Oncology | Admitting: Hematology & Oncology

## 2015-02-13 DIAGNOSIS — C7951 Secondary malignant neoplasm of bone: Secondary | ICD-10-CM | POA: Insufficient documentation

## 2015-02-13 DIAGNOSIS — C229 Malignant neoplasm of liver, not specified as primary or secondary: Secondary | ICD-10-CM | POA: Insufficient documentation

## 2015-02-13 DIAGNOSIS — C50919 Malignant neoplasm of unspecified site of unspecified female breast: Secondary | ICD-10-CM

## 2015-02-13 MED ORDER — TECHNETIUM TC 99M-LABELED RED BLOOD CELLS IV KIT
30.0000 | PACK | Freq: Once | INTRAVENOUS | Status: AC | PRN
  Administered 2015-02-13: 27.5 via INTRAVENOUS

## 2015-02-13 MED ORDER — HEPARIN SOD (PORK) LOCK FLUSH 100 UNIT/ML IV SOLN
INTRAVENOUS | Status: AC
  Filled 2015-02-13: qty 5

## 2015-02-15 ENCOUNTER — Ambulatory Visit (HOSPITAL_COMMUNITY)
Admission: RE | Admit: 2015-02-15 | Discharge: 2015-02-15 | Disposition: A | Discharge status: Home / Self Care | Payer: 59 | Source: Ambulatory Visit | Attending: Oncology | Admitting: Oncology

## 2015-02-15 ENCOUNTER — Encounter (HOSPITAL_COMMUNITY): Payer: 59

## 2015-02-15 ENCOUNTER — Encounter: Payer: Self-pay | Admitting: *Deleted

## 2015-02-15 ENCOUNTER — Encounter (HOSPITAL_BASED_OUTPATIENT_CLINIC_OR_DEPARTMENT_OTHER): Payer: 59 | Admitting: Oncology

## 2015-02-15 ENCOUNTER — Encounter (HOSPITAL_COMMUNITY): Payer: Self-pay | Admitting: Oncology

## 2015-02-15 ENCOUNTER — Other Ambulatory Visit (HOSPITAL_COMMUNITY): Payer: Self-pay | Admitting: Oncology

## 2015-02-15 VITALS — BP 131/85 | HR 76 | Temp 99.2°F | Resp 16 | Wt 271.2 lb

## 2015-02-15 DIAGNOSIS — C7931 Secondary malignant neoplasm of brain: Secondary | ICD-10-CM

## 2015-02-15 DIAGNOSIS — C50919 Malignant neoplasm of unspecified site of unspecified female breast: Secondary | ICD-10-CM

## 2015-02-15 DIAGNOSIS — C7951 Secondary malignant neoplasm of bone: Secondary | ICD-10-CM | POA: Diagnosis not present

## 2015-02-15 DIAGNOSIS — C50812 Malignant neoplasm of overlapping sites of left female breast: Secondary | ICD-10-CM

## 2015-02-15 DIAGNOSIS — M545 Low back pain, unspecified: Secondary | ICD-10-CM

## 2015-02-15 DIAGNOSIS — I427 Cardiomyopathy due to drug and external agent: Secondary | ICD-10-CM

## 2015-02-15 DIAGNOSIS — C787 Secondary malignant neoplasm of liver and intrahepatic bile duct: Secondary | ICD-10-CM | POA: Diagnosis not present

## 2015-02-15 DIAGNOSIS — M79602 Pain in left arm: Secondary | ICD-10-CM

## 2015-02-15 DIAGNOSIS — M79622 Pain in left upper arm: Secondary | ICD-10-CM

## 2015-02-15 DIAGNOSIS — I428 Other cardiomyopathies: Secondary | ICD-10-CM

## 2015-02-15 DIAGNOSIS — C229 Malignant neoplasm of liver, not specified as primary or secondary: Secondary | ICD-10-CM | POA: Diagnosis not present

## 2015-02-15 LAB — CBC WITH DIFFERENTIAL/PLATELET
BASOS ABS: 0 10*3/uL (ref 0.0–0.1)
BASOS PCT: 0 %
EOS ABS: 0 10*3/uL (ref 0.0–0.7)
Eosinophils Relative: 0 %
HEMATOCRIT: 39.1 % (ref 36.0–46.0)
Hemoglobin: 12.5 g/dL (ref 12.0–15.0)
Lymphocytes Relative: 9 %
Lymphs Abs: 0.9 10*3/uL (ref 0.7–4.0)
MCH: 32.3 pg (ref 26.0–34.0)
MCHC: 32 g/dL (ref 30.0–36.0)
MCV: 101 fL — ABNORMAL HIGH (ref 78.0–100.0)
MONO ABS: 0.7 10*3/uL (ref 0.1–1.0)
Monocytes Relative: 7 %
NEUTROS ABS: 8.5 10*3/uL — AB (ref 1.7–7.7)
NEUTROS PCT: 84 %
Platelets: 168 10*3/uL (ref 150–400)
RBC: 3.87 MIL/uL (ref 3.87–5.11)
RDW: 14.9 % (ref 11.5–15.5)
WBC: 10.1 10*3/uL (ref 4.0–10.5)

## 2015-02-15 LAB — COMPREHENSIVE METABOLIC PANEL
ALBUMIN: 3.7 g/dL (ref 3.5–5.0)
ALT: 37 U/L (ref 14–54)
AST: 18 U/L (ref 15–41)
Alkaline Phosphatase: 30 U/L — ABNORMAL LOW (ref 38–126)
Anion gap: 8 (ref 5–15)
BILIRUBIN TOTAL: 0.4 mg/dL (ref 0.3–1.2)
BUN: 13 mg/dL (ref 6–20)
CHLORIDE: 104 mmol/L (ref 101–111)
CO2: 29 mmol/L (ref 22–32)
Calcium: 8.5 mg/dL — ABNORMAL LOW (ref 8.9–10.3)
Creatinine, Ser: 0.79 mg/dL (ref 0.44–1.00)
GFR calc Af Amer: >60 mL/min (ref >60)
GFR calc non Af Amer: >60 mL/min (ref >60)
GLUCOSE: 138 mg/dL — AB (ref 65–99)
POTASSIUM: 4.2 mmol/L (ref 3.5–5.1)
SODIUM: 141 mmol/L (ref 135–145)
TOTAL PROTEIN: 6.5 g/dL (ref 6.5–8.1)

## 2015-02-15 MED ORDER — GADOBENATE DIMEGLUMINE 529 MG/ML IV SOLN
20.0000 mL | Freq: Once | INTRAVENOUS | Status: AC | PRN
  Administered 2015-02-15: 20 mL via INTRAVENOUS

## 2015-02-15 MED ORDER — DENOSUMAB 120 MG/1.7ML ~~LOC~~ SOLN
120.0000 mg | Freq: Once | SUBCUTANEOUS | Status: DC
  Filled 2015-02-15: qty 1.7

## 2015-02-15 NOTE — Progress Notes (Signed)
Please see chemo encounter for more inforamtion

## 2015-02-15 NOTE — Patient Instructions (Signed)
[place] at [facility] Discharge Instructions  RECOMMENDATIONS MADE BY THE CONSULTANT AND ANY TEST RESULTS WILL BE SENT TO YOUR REFERRING PHYSICIAN.  Exam and discussion by Robynn Pane, PA-C Decrease Dexamethasone to 1/2 tablet daily until your return.  If headaches return  Go back up to full tablet. Will Hold Herceptin for 4 weeks because your ejection fraction is lower. Will do xray of your left humerus and MRI of your lumbar spine today Call with concerns or issues  Follow-up in 2 weeks and 4 weeks.  Thank you for choosing [place] at [facility] to provide your oncology and hematology care.  To afford each patient quality time with our provider, please arrive at least 15 minutes before your scheduled appointment time.    You need to re-schedule your appointment should you arrive 10 or more minutes late.  We strive to give you quality time with our providers, and arriving late affects you and other patients whose appointments are after yours.  Also, if you no show three or more times for appointments you may be dismissed from the clinic at the providers discretion.     Again, thank you for choosing [facility].  Our hope is that these requests will decrease the amount of time that you wait before being seen by our physicians.       _____________________________________________________________  Should you have questions after your visit to [facility], please contact our office at [phone] between the hours of 8:30 a.m. and 4:30 p.m.  Voicemails left after 4:30 p.m. will not be returned until the following business day.  For prescription refill requests, have your pharmacy contact our office.

## 2015-02-15 NOTE — Assessment & Plan Note (Addendum)
Stage IV adenocarcinoma with metastases to liver, bone, and brain.  Currently on Herceptin/Tamoxifen/XGEVA.  Oncology history updated.  Continue Tamoxifen and Xgeva.  Decline in LVEF.  See below for more details.  Hold Herceptin x 4 weeks and repeat MUGA in 4 weeks.  Unexplained left upper arm pain.  Will get a left humerus xray to evaluate for metastatic disease.  Exam is benign.  Unexplained low back pain, new.  Ache feeling in nature.  Worse when on feet.  B/L LE weakness.  Given her history and symptoms, I will order a L-spine MRI STAT to evaluate for fracture, metastatic disease, and rule out cord compression.  She has pain medication at home.  Continue on Dexamethasone daily.  Weight continues to increase.  She feels terrible as a result.  She is very interested in decreasing her Dexamethasone.  I have recommended decreasing to 1/2 tablet daily until she returns.  If she does well, will consider decreasing further.  Any development of headaches or neurological symptoms, she is to go back up on her dexamethasone dose.  She is agreeable to this plan.  She is leaving Sunday for a 1 week trip to [facility].  She will call with any problems.  Return in 2 weeks for follow-up.  Will hold Herceptin x 4 weeks with a repeat MUGA as outlined below.

## 2015-02-15 NOTE — Progress Notes (Signed)
herceptin held today

## 2015-02-15 NOTE — Assessment & Plan Note (Signed)
Oncology history updated to reflect MUGA results.  LVEF on MUGA from 02/13/2015 is 59%.  Herceptin dosing adjustment for Cardiotoxicity: LVEF >/=16% decrease from baseline or LVEF below normal limits and >/=10% decrease from baseline: Withhold treatment for at least 4 weeks and repeat LVEF every 4 weeks. May resume trastuzumab treatment if LVEF returns to normal limits within 4 to 8 weeks and remains at >/=15% decrease from baseline value. Discontinue permanently for persistent (>8 weeks) LVEF decline or for >3 incidents of treatment interruptions for cardiomyopathy.  Will hold Herceptin x 4 weeks. Repeat MUGA in 4 weeks.

## 2015-02-15 NOTE — Progress Notes (Signed)
Allison Labrum, [name] [place] [address]  Breast cancer, stage 4, unspecified laterality ([place]) - Plan: DG Humerus Left, CBC with Differential, Comprehensive metabolic panel, Cancer antigen 27.29, CBC with Differential, Comprehensive metabolic panel, Cancer antigen 27.29, CANCELED: MR Lumbar Spine W Contrast, CANCELED: MR Lumbar Spine W Contrast  Drug-induced cardiomyopathy ([place]) - Plan: NM Cardiac Muga Rest  Midline low back pain without sciatica - Plan: CANCELED: MR Lumbar Spine W Contrast, CANCELED: MR Lumbar Spine W Contrast  Left upper arm pain - Plan: DG Humerus Left  CURRENT THERAPY: Herceptin/Tamoxifen/XGEVA   INTERVAL HISTORY: [name] 50 y.o. female returns for followup of Stage IV adenocarcinoma with metastases to liver, bone, and brain.    Breast cancer, stage 4 ([place])   04/25/2014 Initial Diagnosis Breast cancer, stage 4   04/25/2014 Imaging CT abdomen/pelvia with widespread metastatic disease to the liver, multiple lytic lesions throughout spine and pelvis. No FX or epidural tumor identified   04/26/2014 Imaging CT head unremarkable   04/26/2014 Imaging CT chest with no lung mass or pulmonary nodules, no adenopathy. Lytic bone lesions, right 2nd rib   04/27/2014 Initial Biopsy U/S guided liver biopsy, lesion in anterior and inferior left hepatic lobe biopsied   04/27/2014 Pathology Results Metastatic adenocarcinoma, CK7, ER+, patchy positivity with PR. Possible primary includes breast, less likely gynecologic   05/15/2014 Mammogram BI-RADS CATEGORY  2: Benign Finding(s)   05/16/2014 PET scan 1. Intensely hypermetabolic hepatic metastasis. 2. Widespread hypermetabolic skeletal lesions. 3. No primary adenocarcinoma identified by FDG PET imaging.   05/19/2014 Imaging MUGA- Left ventricular ejection fracture greater than 70%.   05/21/2014 Breast MRI No suspicious masses or enhancement within the breasts. No axillary adenopathy.   05/22/2014 - 07/03/2014  Antibody Plan Herceptin/Perjeta/Tamoxifen   06/12/2014 - 07/03/2014 Chemotherapy Taxotere added secondary to persistent abdominal and back pain   06/17/2014 - 06/19/2014 Hospital Admission Neutropenia, fever, diarrhea, nausea, vomiting   06/20/2014 - 07/10/2014 Radiation Therapy Dr. Thea Silversmith 12 fractions to L3-S3 (30 Gy) and left scapula (20 Gy).    07/03/2014 Adverse Reaction Perjeta- induced diarrhea.  Perjeta discontinued   07/16/2014 - 07/20/2014 Hospital Admission Electrolyte abnormalities, and diarrhea.  Suspect Perjeta-induced diarrhea.  Negative GI work-up.   07/24/2014 -  Chemotherapy Herceptin/Tamoxifen/Xgeva   08/21/2014 Imaging MUGA- Left ventricular ejection fraction equals 71%.   08/24/2014 PET scan Dramatic reduction in metabolic activity of the widespread liver metastasis. Liver metastasis now have metabolic activity equal to background normal liver activity. Liver has a nodular contour. Marked reduction in metabolic activity of skeletal lesions..   10/05/2014 Progression Widespread metastatic disease to the brain as described. Between 20 and 30 intracranial metastatic deposits are now seen. No midline shift or incipient herniation   10/09/2014 - 10/26/2014 Radiation Therapy Whole Brain XRT   11/14/2014 Imaging MUGA- LVEF 67%   02/13/2015 Imaging MUGA- LVEF 59%   02/15/2015 Treatment Plan Change Due to declining LVEF, will hold Herceptin per PI guidelines.    I personally reviewed and went over laboratory results with the patient.  The results are noted within this dictation.  I personally reviewed and went over radiographic studies with the patient.  The results are noted within this dictation. Her ejection fracture has declined to 59% compared to 67% in July and as high as 76.9% in Jan 2016.  This is likely secondary to Herceptin therapy.  Will follow cardiotoxicity guidelines for Herceptin-induced cardiotoxicity.  I reviewed this information with the patient.  If we have to hold herceptin for > 4  weeks, we may need to think about systemic chemotherapy until herceptin can be restarted.  She notes a new low back pain.  She describes it as constant.  She notes that it is worse with standing.  She reports it can be as bad as an 8/10 on a 10 point pain scale.  She does have pain medication at home.  She does not use it.  She reports that it is effective when she does take it.  She denies any trauma that may explain this symptom.  She notes right upper arm pain with point tenderness to palpation lateral mid-humerus.  No rash, edema, or erythema.  Pain increases with tenderness only.    She continues to gain weight, likely from continued Dexamethasone.  She denies any headaches or other neurologic complaints.  She wants to be off dexamethasone.  I think it is reasonable to decrease to 1/2 tab daily; knowing that if neurological complaints arise, including headache, she can go back up on dosing.  She notes weakness in her LE without sciatica.  I wonder if this is a myopathy from continued steroids or related to her back issues.  Past Medical History  Diagnosis Date  . PONV (postoperative nausea and vomiting)     pt states scope patch does well  . Anemia   . Depression   . Anxiety   . Headache(784.0)     has migraines - medication controls  . Family history of prostate cancer   . Hot flashes due to tamoxifen 09/25/2014  . Breast cancer ([place]) 04/2014    Presumed dx; stage 4 w/ mets to bone and liver, brain lesions  . Bony metastasis ([place])   . S/P small bowel resection   . Drug-induced cardiomyopathy ([place]) 02/15/2015    has Hyperlipidemia; Depression; Breast cancer, stage 4 ([place]); Bone metastases ([place]); DVT (deep venous thrombosis) ([place]); GERD (gastroesophageal reflux disease); Family history of prostate cancer; Genetic testing; Hot flashes due to tamoxifen; Brain metastases ([place]); and Drug-induced cardiomyopathy ([place]) on her problem list.     has No Known Allergies.  Current Outpatient  Prescriptions on File Prior to Visit  Medication Sig Dispense Refill  . acetaminophen (TYLENOL) 325 MG tablet Take 650 mg by mouth every 6 (six) hours as needed for mild pain or fever.     . ALPRAZolam (XANAX) 0.5 MG tablet Take 0.5 mg by mouth 3 (three) times daily as needed for anxiety. May take 1 mg at night    . Calcium-Phosphorus-Vitamin D (CALCIUM/D3 ADULT GUMMIES PO) Take by mouth. Patient taking 4 gummies a day to equal her dosage of calcium 1200mg  and vitamin d 1000mg     . dexamethasone (DECADRON) 4 MG tablet Take 1 tablet (4 mg total) by mouth daily. 30 tablet 0  . diphenhydrAMINE (BENADRYL) 25 mg capsule Take 25 mg by mouth every 6 (six) hours as needed for itching.    . furosemide (LASIX) 20 MG tablet Take 1 tablet (20 mg total) by mouth daily. (Patient not taking: Reported on 01/29/2015) 30 tablet 1  . lidocaine-prilocaine (EMLA) cream Apply 1 application topically daily as needed (apply to port before chemo).     . meclizine (ANTIVERT) 25 MG tablet Take 1 tablet (25 mg total) by mouth every 6 (six) hours as needed for dizziness. 30 tablet 1  . Melatonin 3 MG TABS Take 6 mg by mouth Nightly.    . morphine (MS CONTIN) 30 MG 12 hr tablet  Take 1 tablet (30 mg total) by mouth every 12 (twelve) hours. 60 tablet 0  . ondansetron (ZOFRAN) 8 MG tablet Take 1 tablet (8 mg total) by mouth every 8 (eight) hours as needed for nausea or vomiting. 30 tablet 1  . oxyCODONE (OXY IR/ROXICODONE) 5 MG immediate release tablet Take 1-2 tablets (5-10 mg total) by mouth every 4 (four) hours as needed. (Patient taking differently: Take 5-10 mg by mouth every 4 (four) hours as needed for moderate pain. ) 60 tablet 0  . pantoprazole (PROTONIX) 40 MG tablet TAKE 1 TABLET BY MOUTH EVERY DAY 30 tablet 3  . potassium chloride SA (K-DUR,KLOR-CON) 20 MEQ tablet Take 1 tablet (20 mEq total) by mouth 2 (two) times daily. 30 tablet 0  . prochlorperazine (COMPAZINE) 10 MG tablet Take 1 tablet (10 mg total) by mouth every 6  (six) hours as needed for nausea or vomiting. (Patient not taking: Reported on 01/29/2015) 30 tablet 3  . promethazine (PHENERGAN) 25 MG tablet Take 1 tablet (25 mg total) by mouth every 6 (six) hours as needed for nausea or vomiting. (Patient not taking: Reported on 01/29/2015) 30 tablet 1  . rivaroxaban (XARELTO) 20 MG TABS tablet Take 1 tablet (20 mg total) by mouth daily with supper. 30 tablet 6  . sulfamethoxazole-trimethoprim (BACTRIM DS,SEPTRA DS) 800-160 MG per tablet Take 1 tablet by mouth 2 (two) times daily. (Patient not taking: Reported on 01/29/2015) 10 tablet 0  . SUMAtriptan (IMITREX) 100 MG tablet Take 1 tablet (100 mg total) by mouth as needed. foir migraine 10 tablet 3  . tamoxifen (NOLVADEX) 20 MG tablet Take 1 tablet (20 mg total) by mouth daily. 30 tablet 2  . Trastuzumab (HERCEPTIN IV) Inject into the vein.    Marland Kitchen venlafaxine XR (EFFEXOR XR) 75 MG 24 hr capsule Take 2 capsules (150 mg total) by mouth daily with breakfast. 60 capsule 3   No current facility-administered medications on file prior to visit.    Past Surgical History  Procedure Laterality Date  . Right rotator cuff      2002  . Neck fusion      2003  . Laparoscopic cholecystectomy      2004  . Right knee arthroscopy      2005  . Appendectomy      2008  . Abdominal hysterectomy    . Breast reduction surgery  03/17/2011    Procedure: MAMMARY REDUCTION BILATERAL (BREAST);  Surgeon: Mary A Contogiannis;  Location: [facility];  Service: Plastics;  Laterality: Bilateral;  . Colonoscopy N/A 07/13/2013    Procedure: COLONOSCOPY;  Surgeon: Rogene Houston, [name];  [facility];  Service: Endoscopy;  Laterality: N/A;  930  . Liver biopsy  04/2014  . Portacath placement    . Esophagogastroduodenoscopy N/A 05/25/2014    Procedure: ESOPHAGOGASTRODUODENOSCOPY (EGD);  Surgeon: Rogene Houston, [name];  [facility];  Service: Endoscopy;  Laterality: N/A;  155  . Laparoscopic appendectomy    .  Colonoscopy N/A 11/26/2014    Procedure: COLONOSCOPY;  Surgeon: Rogene Houston, [name];  [facility];  Service: Endoscopy;  Laterality: N/A;  730    Denies any headaches, dizziness, double vision, fevers, chills, night sweats, nausea, vomiting, diarrhea, constipation, chest pain, heart palpitations, shortness of breath, blood in stool, black tarry stool, urinary pain, urinary burning, urinary frequency, hematuria.   PHYSICAL EXAMINATION  ECOG PERFORMANCE STATUS: 1 - Symptomatic but completely ambulatory  Filed Vitals:   02/15/15 4967  BP: 131/85  Pulse: 76  Temp: 99.2 F (37.3 C)  Resp: 16    GENERAL:alert, no distress, well nourished, well developed, comfortable, cooperative, obese, smiling and unaccompanied today in the exam room. SKIN: skin color, texture, turgor are normal, no rashes or significant lesions HEAD: Normocephalic, No masses, lesions, tenderness or abnormalities, alopecia EYES: normal, PERRLA, EOMI, Conjunctiva are pink and non-injected EARS: External ears normal OROPHARYNX:lips, buccal mucosa, and tongue normal and mucous membranes are moist  NECK: supple, trachea midline LYMPH:  no palpable lymphadenopathy BREAST:not examined LUNGS: clear to auscultation and percussion HEART: regular rate & rhythm, without murmur ABDOMEN:abdomen soft and normal bowel sounds BACK: Back symmetric, no curvature. No tenderness to palpation of midline. EXTREMITIES:less then 2 second capillary refill, no joint deformities, effusion, or inflammation, no skin discoloration.  Left upper arm tenderness on palpation laterally without edema, erythema, rash, or palpable abnormality.  NEURO: alert & oriented x 3 with fluent speech, no focal motor/sensory deficits   LABORATORY DATA: CBC    Component Value Date/Time   WBC 10.1 02/15/2015 1103   RBC 3.87 02/15/2015 1103   HGB 12.5 02/15/2015 1103   HCT 39.1 02/15/2015 1103   PLT 168 02/15/2015 1103   MCV 101.0* 02/15/2015 1103    MCH 32.3 02/15/2015 1103   MCHC 32.0 02/15/2015 1103   RDW 14.9 02/15/2015 1103   LYMPHSABS 0.9 02/15/2015 1103   MONOABS 0.7 02/15/2015 1103   EOSABS 0.0 02/15/2015 1103   BASOSABS 0.0 02/15/2015 1103      Chemistry      Component Value Date/Time   NA 136 01/29/2015 1113   K 3.5 01/29/2015 1113   CL 102 01/29/2015 1113   CO2 28 01/29/2015 1113   BUN 27* 01/29/2015 1113   CREATININE 0.90 01/29/2015 1113      Component Value Date/Time   CALCIUM 8.8* 01/29/2015 1113   ALKPHOS 25* 01/29/2015 1113   AST 21 01/29/2015 1113   ALT 37 01/29/2015 1113   BILITOT 0.6 01/29/2015 1113        PENDING LABS:   RADIOGRAPHIC STUDIES:  Nm Cardiac Muga Rest  02/13/2015   CLINICAL DATA:  Breast cancer stage IV, high risk medication  EXAM: NUCLEAR MEDICINE CARDIAC BLOOD POOL IMAGING (MUGA)  TECHNIQUE: Cardiac multi-gated acquisition was performed at rest following intravenous injection of Tc-71m labeled red blood cells.  RADIOPHARMACEUTICALS:  27.5 mCi Tc-59m in-vitro labeled autologous red blood cells IV (Ultratag)  COMPARISON:  11/14/2014  FINDINGS: Calculated LEFT ventricular ejection fraction is 59%, decreased from the 67% on the previous exam.  Wall motion analysis in 3 projections demonstrates normal LEFT ventricular wall motion.  IMPRESSION: Normal LEFT ventricular ejection fraction of 59%, decreased from the 67% calculated on 11/14/2014.  Normal LEFT ventricular wall motion.   Electronically Signed   By: Lavonia Dana M.D.   On: 02/13/2015 15:07     PATHOLOGY:    ASSESSMENT AND PLAN:  Breast cancer, stage 4 Stage IV adenocarcinoma with metastases to liver, bone, and brain.  Currently on Herceptin/Tamoxifen/XGEVA.  Oncology history updated.  Continue Tamoxifen and Xgeva.  Decline in LVEF.  See below for more details.  Hold Herceptin x 4 weeks and repeat MUGA in 4 weeks.  Unexplained left upper arm pain.  Will get a left humerus xray to evaluate for metastatic disease.  Exam is  benign.  Unexplained low back pain, new.  Ache feeling in nature.  Worse when on feet.  B/L LE weakness.  Given her history and symptoms, I  will order a L-spine MRI STAT to evaluate for fracture, metastatic disease, and rule out cord compression.  She has pain medication at home.  Continue on Dexamethasone daily.  Weight continues to increase.  She feels terrible as a result.  She is very interested in decreasing her Dexamethasone.  I have recommended decreasing to 1/2 tablet daily until she returns.  If she does well, will consider decreasing further.  Any development of headaches or neurological symptoms, she is to go back up on her dexamethasone dose.  She is agreeable to this plan.  She is leaving Sunday for a 1 week trip to [facility].  She will call with any problems.  Return in 2 weeks for follow-up.  Will hold Herceptin x 4 weeks with a repeat MUGA as outlined below.  Drug-induced cardiomyopathy [facility]) Oncology history updated to reflect MUGA results.  LVEF on MUGA from 02/13/2015 is 59%.  Herceptin dosing adjustment for Cardiotoxicity: LVEF >/=16% decrease from baseline or LVEF below normal limits and >/=10% decrease from baseline: Withhold treatment for at least 4 weeks and repeat LVEF every 4 weeks. May resume trastuzumab treatment if LVEF returns to normal limits within 4 to 8 weeks and remains at >/=15% decrease from baseline value. Discontinue permanently for persistent (>8 weeks) LVEF decline or for >3 incidents of treatment interruptions for cardiomyopathy.  Will hold Herceptin x 4 weeks. Repeat MUGA in 4 weeks.    THERAPY PLAN:  Hold Herceptin x 4 weeks.  Continue Tamoxifen daily.  Xgeva every 4 weeks.  All questions were answered. The patient knows to call the clinic with any problems, questions or concerns. We can certainly see the patient much sooner if necessary.  Patient and plan discussed with Dr. Ancil Linsey and she is in agreement with the aforementioned.    This note is electronically signed by: Robynn Pane, PA-C 02/15/2015 11:19 AM

## 2015-02-15 NOTE — Progress Notes (Signed)
[place] Clinical Social Work  Pt attended Brain Tumor Support Group on 02/14/15 and actively participated in group. CSW to follow at [facility]. Pt is registered for Living With Cancer Class.    Clinical Social Work interventions: Group support [name], [place] Worker [place]  [place] Phone: [phone] Fax: [phone]

## 2015-02-16 LAB — CANCER ANTIGEN 27.29: CA 27.29: 27.8 U/mL (ref 0.0–38.6)

## 2015-02-19 ENCOUNTER — Encounter (HOSPITAL_COMMUNITY): Payer: 59

## 2015-02-25 ENCOUNTER — Other Ambulatory Visit (HOSPITAL_COMMUNITY): Payer: Self-pay | Admitting: Oncology

## 2015-02-25 DIAGNOSIS — J069 Acute upper respiratory infection, unspecified: Secondary | ICD-10-CM

## 2015-02-25 MED ORDER — AMOXICILLIN-POT CLAVULANATE 875-125 MG PO TABS: 1.0000 | ORAL_TABLET | Freq: Two times a day (BID) | ORAL | Status: DC
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
## 2015-02-27 NOTE — Assessment & Plan Note (Addendum)
Stage IV adenocarcinoma with metastases to liver, bone, and brain.  Currently on Herceptin/Tamoxifen/XGEVA.  Oncology history is up-to-date.  Continue Tamoxifen and Xgeva.  Decline in LVEF resulting in Herceptin being held x 4 weeks.  Repeat MUGA is scheduled in 2 weeks.  When seen 2 weeks ago, imaging studies were completed and were negative for any new/acute findings.  No new neurologic complaints since decrease on Dexamethasone to 1/2 tablet daily over the past 2 weeks.  She is very interested in further decrease of Dexamethasone.  Will decrease to 1/2 tablet QOD x 2 weeks.  We will re-evaluate in 2 weeks for consideration of further taper.  She is educated to go back up on her dexamethasone with any neurologic complaints.  She asks about a pneumovax.  She has never had one.  I have confirmed with Dr. Whitney Muse that this injection is safe in her setting.  Order is placed and we will plan to administer in 2 weeks when she returns.  Order signed and held.  She saw her ophthalmologist recently who found that she has a left eye cataract.  Return in 2 weeks for follow-up.  Will restart Herceptin if LVEF has recovered appropriately.

## 2015-02-28 ENCOUNTER — Encounter (HOSPITAL_COMMUNITY): Payer: Self-pay | Admitting: Oncology

## 2015-02-28 ENCOUNTER — Encounter (HOSPITAL_COMMUNITY): Payer: 59 | Attending: Hematology & Oncology | Admitting: Oncology

## 2015-02-28 VITALS — BP 125/78 | HR 91 | Temp 97.8°F | Resp 18 | Wt 265.5 lb

## 2015-02-28 DIAGNOSIS — C787 Secondary malignant neoplasm of liver and intrahepatic bile duct: Secondary | ICD-10-CM

## 2015-02-28 DIAGNOSIS — C7951 Secondary malignant neoplasm of bone: Secondary | ICD-10-CM | POA: Diagnosis not present

## 2015-02-28 DIAGNOSIS — C229 Malignant neoplasm of liver, not specified as primary or secondary: Secondary | ICD-10-CM | POA: Insufficient documentation

## 2015-02-28 DIAGNOSIS — C7931 Secondary malignant neoplasm of brain: Secondary | ICD-10-CM

## 2015-02-28 DIAGNOSIS — Z79810 Long term (current) use of selective estrogen receptor modulators (SERMs): Secondary | ICD-10-CM

## 2015-02-28 DIAGNOSIS — C50919 Malignant neoplasm of unspecified site of unspecified female breast: Secondary | ICD-10-CM

## 2015-02-28 DIAGNOSIS — Z170 Estrogen receptor positive status [ER+]: Secondary | ICD-10-CM

## 2015-02-28 NOTE — Patient Instructions (Signed)
[place] at [facility] Discharge Instructions  RECOMMENDATIONS MADE BY THE CONSULTANT AND ANY TEST RESULTS WILL BE SENT TO YOUR REFERRING PHYSICIAN.  Exam and discussion by Robynn Pane, PA-C You can take Mucinex + Expectorant Decrease decadron to 1/2 tablet every other day. Call with questions or concerns.  Follow-up as scheduled.  Thank you for choosing [place] at [facility] to provide your oncology and hematology care.  To afford each patient quality time with our provider, please arrive at least 15 minutes before your scheduled appointment time.    You need to re-schedule your appointment should you arrive 10 or more minutes late.  We strive to give you quality time with our providers, and arriving late affects you and other patients whose appointments are after yours.  Also, if you no show three or more times for appointments you may be dismissed from the clinic at the providers discretion.     Again, thank you for choosing [facility].  Our hope is that these requests will decrease the amount of time that you wait before being seen by our physicians.       _____________________________________________________________  Should you have questions after your visit to [facility], please contact our office at [phone] between the hours of 8:30 a.m. and 4:30 p.m.  Voicemails left after 4:30 p.m. will not be returned until the following business day.  For prescription refill requests, have your pharmacy contact our office.

## 2015-03-01 ENCOUNTER — Other Ambulatory Visit (HOSPITAL_COMMUNITY): Payer: Self-pay | Admitting: Oncology

## 2015-03-13 ENCOUNTER — Encounter (HOSPITAL_COMMUNITY)
Admission: RE | Admit: 2015-03-13 | Discharge: 2015-03-13 | Disposition: A | Discharge status: Home / Self Care | Payer: 59 | Source: Ambulatory Visit | Attending: Oncology | Admitting: Oncology

## 2015-03-13 ENCOUNTER — Encounter (HOSPITAL_COMMUNITY): Payer: Self-pay

## 2015-03-13 ENCOUNTER — Encounter (HOSPITAL_COMMUNITY): Payer: 59

## 2015-03-13 ENCOUNTER — Other Ambulatory Visit (HOSPITAL_COMMUNITY): Payer: Self-pay | Admitting: Oncology

## 2015-03-13 DIAGNOSIS — C50919 Malignant neoplasm of unspecified site of unspecified female breast: Secondary | ICD-10-CM

## 2015-03-13 DIAGNOSIS — C7951 Secondary malignant neoplasm of bone: Secondary | ICD-10-CM

## 2015-03-13 DIAGNOSIS — I427 Cardiomyopathy due to drug and external agent: Secondary | ICD-10-CM | POA: Diagnosis not present

## 2015-03-13 MED ORDER — HEPARIN SOD (PORK) LOCK FLUSH 100 UNIT/ML IV SOLN
INTRAVENOUS | Status: AC
  Filled 2015-03-13: qty 5

## 2015-03-13 MED ORDER — TECHNETIUM TC 99M-LABELED RED BLOOD CELLS IV KIT
25.0000 | PACK | Freq: Once | INTRAVENOUS | Status: AC | PRN
  Administered 2015-03-13: 23 via INTRAVENOUS

## 2015-03-13 MED ORDER — MORPHINE SULFATE ER 30 MG PO TBCR: 30.0000 mg | EXTENDED_RELEASE_TABLET | Freq: Two times a day (BID) | ORAL | Status: DC

## 2015-03-15 ENCOUNTER — Encounter (HOSPITAL_COMMUNITY): Payer: Self-pay | Admitting: Hematology & Oncology

## 2015-03-15 ENCOUNTER — Inpatient Hospital Stay (HOSPITAL_COMMUNITY): Payer: 59

## 2015-03-15 ENCOUNTER — Encounter (HOSPITAL_BASED_OUTPATIENT_CLINIC_OR_DEPARTMENT_OTHER): Payer: 59 | Admitting: Hematology & Oncology

## 2015-03-15 ENCOUNTER — Encounter (HOSPITAL_BASED_OUTPATIENT_CLINIC_OR_DEPARTMENT_OTHER): Payer: 59

## 2015-03-15 ENCOUNTER — Encounter (HOSPITAL_COMMUNITY): Payer: 59 | Attending: Hematology & Oncology

## 2015-03-15 ENCOUNTER — Encounter (HOSPITAL_COMMUNITY): Payer: 59

## 2015-03-15 VITALS — BP 133/74 | HR 79 | Temp 98.3°F | Resp 16 | Wt 268.0 lb

## 2015-03-15 DIAGNOSIS — C787 Secondary malignant neoplasm of liver and intrahepatic bile duct: Secondary | ICD-10-CM | POA: Diagnosis not present

## 2015-03-15 DIAGNOSIS — F419 Anxiety disorder, unspecified: Secondary | ICD-10-CM

## 2015-03-15 DIAGNOSIS — Z23 Encounter for immunization: Secondary | ICD-10-CM

## 2015-03-15 DIAGNOSIS — C7931 Secondary malignant neoplasm of brain: Secondary | ICD-10-CM | POA: Diagnosis not present

## 2015-03-15 DIAGNOSIS — C7951 Secondary malignant neoplasm of bone: Secondary | ICD-10-CM | POA: Diagnosis not present

## 2015-03-15 DIAGNOSIS — C50919 Malignant neoplasm of unspecified site of unspecified female breast: Secondary | ICD-10-CM

## 2015-03-15 DIAGNOSIS — C50812 Malignant neoplasm of overlapping sites of left female breast: Secondary | ICD-10-CM

## 2015-03-15 DIAGNOSIS — R5383 Other fatigue: Secondary | ICD-10-CM

## 2015-03-15 DIAGNOSIS — C229 Malignant neoplasm of liver, not specified as primary or secondary: Secondary | ICD-10-CM | POA: Diagnosis not present

## 2015-03-15 DIAGNOSIS — Z0000 Encounter for general adult medical examination without abnormal findings: Secondary | ICD-10-CM

## 2015-03-15 LAB — COMPREHENSIVE METABOLIC PANEL
ALBUMIN: 3.6 g/dL (ref 3.5–5.0)
ALK PHOS: 37 U/L — AB (ref 38–126)
ALT: 36 U/L (ref 14–54)
ANION GAP: 8 (ref 5–15)
AST: 24 U/L (ref 15–41)
BILIRUBIN TOTAL: 0.5 mg/dL (ref 0.3–1.2)
BUN: 17 mg/dL (ref 6–20)
CALCIUM: 9 mg/dL (ref 8.9–10.3)
CO2: 26 mmol/L (ref 22–32)
Chloride: 104 mmol/L (ref 101–111)
Creatinine, Ser: 0.73 mg/dL (ref 0.44–1.00)
GLUCOSE: 140 mg/dL — AB (ref 65–99)
POTASSIUM: 3.4 mmol/L — AB (ref 3.5–5.1)
Sodium: 138 mmol/L (ref 135–145)
TOTAL PROTEIN: 6.2 g/dL — AB (ref 6.5–8.1)

## 2015-03-15 MED ORDER — DENOSUMAB 120 MG/1.7ML ~~LOC~~ SOLN
120.0000 mg | Freq: Once | SUBCUTANEOUS | Status: AC
  Administered 2015-03-15: 120 mg via SUBCUTANEOUS
  Filled 2015-03-15: qty 1.7

## 2015-03-15 MED ORDER — PNEUMOCOCCAL VAC POLYVALENT 25 MCG/0.5ML IJ INJ
0.5000 mL | INJECTION | Freq: Once | INTRAMUSCULAR | Status: AC
  Administered 2015-03-15: 0.5 mL via INTRAMUSCULAR
  Filled 2015-03-15: qty 0.5

## 2015-03-15 NOTE — Addendum Note (Signed)
Addended by: Mellissa Kohut on: 03/15/2015 03:43 PM   Modules accepted: Orders

## 2015-03-15 NOTE — Progress Notes (Signed)
See doctors encounter for more information 

## 2015-03-15 NOTE — Progress Notes (Signed)
LABS DRAWN

## 2015-03-15 NOTE — Progress Notes (Signed)
[name], MD [place] [address]    Breast cancer, stage 4 ([place])   04/25/2014 Initial Diagnosis Breast cancer, stage 4   04/25/2014 Imaging CT abdomen/pelvia with widespread metastatic disease to the liver, multiple lytic lesions throughout spine and pelvis. No FX or epidural tumor identified   04/26/2014 Imaging CT head unremarkable   04/26/2014 Imaging CT chest with no lung mass or pulmonary nodules, no adenopathy. Lytic bone lesions, right 2nd rib   04/27/2014 Initial Biopsy U/S guided liver biopsy, lesion in anterior and inferior left hepatic lobe biopsied   04/27/2014 Pathology Results Metastatic adenocarcinoma, CK7, ER+, patchy positivity with PR. Possible primary includes breast, less likely gynecologic   05/15/2014 Mammogram BI-RADS CATEGORY  2: Benign Finding(s)   05/16/2014 PET scan 1. Intensely hypermetabolic hepatic metastasis. 2. Widespread hypermetabolic skeletal lesions. 3. No primary adenocarcinoma identified by FDG PET imaging.   05/19/2014 Imaging MUGA- Left ventricular ejection fracture greater than 70%.   05/21/2014 Breast MRI No suspicious masses or enhancement within the breasts. No axillary adenopathy.   05/22/2014 - 07/03/2014 Antibody Plan Herceptin/Perjeta/Tamoxifen   06/12/2014 - 07/03/2014 Chemotherapy Taxotere added secondary to persistent abdominal and back pain   06/17/2014 - 06/19/2014 Hospital Admission Neutropenia, fever, diarrhea, nausea, vomiting   06/20/2014 - 07/10/2014 Radiation Therapy Dr. Thea Silversmith 12 fractions to L3-S3 (30 Gy) and left scapula (20 Gy).    07/03/2014 Adverse Reaction Perjeta- induced diarrhea.  Perjeta discontinued   07/16/2014 - 07/20/2014 Hospital Admission Electrolyte abnormalities, and diarrhea.  Suspect Perjeta-induced diarrhea.  Negative GI work-up.   07/24/2014 -  Chemotherapy Herceptin/Tamoxifen/Xgeva   08/21/2014 Imaging MUGA- Left ventricular ejection fraction equals 71%.   08/24/2014 PET scan Dramatic reduction in  metabolic activity of the widespread liver metastasis. Liver metastasis now have metabolic activity equal to background normal liver activity. Liver has a nodular contour. Marked reduction in metabolic activity of skeletal lesions..   10/05/2014 Progression Widespread metastatic disease to the brain as described. Between 20 and 30 intracranial metastatic deposits are now seen. No midline shift or incipient herniation   10/09/2014 - 10/26/2014 Radiation Therapy Whole Brain XRT   11/14/2014 Imaging MUGA- LVEF 67%   02/13/2015 Imaging MUGA- LVEF 59%   02/15/2015 Treatment Plan Change Due to declining LVEF, will hold Herceptin per PI guidelines.    Stage IV adenocarcinoma with metastases to liver and bone  Had last mammogram in [place] this year, Dr. Gaetano Net  Colonoscopy 05/18/2013  Breast Reduction 03/17/2011  CURRENT THERAPY: Herceptin/Tamoxifen/XGEVA   INTERVAL HISTORY: [name] 49 y.o. female returns for follow-up of stage IV adenocarcinoma of the breast, ER+, HER 2 + disease. She continues on Tamoxifen and XGEVA. Herceptin has been held secondary to a decline in her EF. She is here today to review her repeat MUGA.  [name] is here today with her husband. She states she's been "hanging out" lately, "trying to get chemo off the streets because I'm getting antsy. I'm just kidding." She admits to anxiety with being off her herceptin.  In terms of her fatigue, she states that she is real tired this week. She is taking "half of her steroids" every other day.   She states she was doing a lot of volunteer classes but then they slowed down; her support groups are also finishing up; she states that she feels that she used to have a lot going on, and now it's fallen off. She states that sometimes she just wants to get up and get  going, but to prevent dizziness, usually has to do things a bit more slowly than she wants to, sitting on the edge of the bed and slowly easing up into action.  She comments  that she's not going to the brain cancer support group,  but she is going to the "living with cancer" support group. She states that there are four of them in the group, and that two of the peers have no support system, so she jokes with her husband that she's "taking them home." She states that she's made some real connections there and that it's been nice. Talking about her support group brings a smile to her face and warmth to her voice.  She comments that her voice gets raspy every other day, but that there are no related pain or other symptoms. She states that her scratchy voice doesn't hurt, it just gets real scratchy. She confirms that she has dry mouth and that even on Biotene her mouth cracks a little bit. She adds that her nose does run as well.  Her eyesight is also a bit blurry now.  She confirms that she is taking her Xgeva every month . She says that her appetite is about 50%. She denies any issues with tastes, and says she doesn't want food, and when she does eat it, it makes her stomach hurt -- not like she needs to go to the bathroom, just soreness. She does confirm that her bowels are all right.  She has had her flu shot.   MEDICAL HISTORY: Past Medical History  Diagnosis Date  . PONV (postoperative nausea and vomiting)     pt states scope patch does well  . Anemia   . Depression   . Anxiety   . Headache(784.0)     has migraines - medication controls  . Family history of prostate cancer   . Hot flashes due to tamoxifen 09/25/2014  . Breast cancer ([place]) 04/2014    Presumed dx; stage 4 w/ mets to bone and liver, brain lesions  . Bony metastasis ([place])   . S/P small bowel resection   . Drug-induced cardiomyopathy ([place]) 02/15/2015    has Hyperlipidemia; Depression; Breast cancer, stage 4 ([place]); Bone metastases ([place]); DVT (deep venous thrombosis) ([place]); GERD (gastroesophageal reflux disease); Family history of prostate cancer; Genetic testing; Hot flashes due to tamoxifen;  Brain metastases ([place]); and Drug-induced cardiomyopathy ([place]) on her problem list.      has No Known Allergies.  We administered pneumococcal 23 valent vaccine.  SURGICAL HISTORY: Past Surgical History  Procedure Laterality Date  . Right rotator cuff      2002  . Neck fusion      2003  . Laparoscopic cholecystectomy      2004  . Right knee arthroscopy      2005  . Appendectomy      2008  . Abdominal hysterectomy    . Breast reduction surgery  03/17/2011    Procedure: MAMMARY REDUCTION BILATERAL (BREAST);  Surgeon: Mary A Contogiannis;  Location: [facility];  Service: Plastics;  Laterality: Bilateral;  . Colonoscopy N/A 07/13/2013    Procedure: COLONOSCOPY;  Surgeon: Rogene Houston, MD;  [facility];  Service: Endoscopy;  Laterality: N/A;  930  . Liver biopsy  04/2014  . Portacath placement    . Esophagogastroduodenoscopy N/A 05/25/2014    Procedure: ESOPHAGOGASTRODUODENOSCOPY (EGD);  Surgeon: Rogene Houston, MD;  [facility];  Service: Endoscopy;  Laterality: N/A;  155  .  Laparoscopic appendectomy    . Colonoscopy N/A 11/26/2014    Procedure: COLONOSCOPY;  Surgeon: Rogene Houston, MD;  [facility];  Service: Endoscopy;  Laterality: N/A;  730    SOCIAL HISTORY: Social History   Social History  . Marital Status: Married    Spouse Name: N/A  . Number of Children: N/A  . Years of Education: N/A   Occupational History  . Not on file.   Social History Main Topics  . Smoking status: Former Smoker    Types: Cigarettes    Quit date: 08/20/1994  . Smokeless tobacco: Never Used  . Alcohol Use: Yes     Comment: Occasionally  . Drug Use: No  . Sexual Activity: Yes    Birth Control/ Protection: Surgical   Other Topics Concern  . Not on file   Social History Narrative    FAMILY HISTORY: Family History  Problem Relation Age of Onset  . Diabetes Father   . Heart attack Maternal Grandmother 30    multiple over  lifetime.  . Cancer Maternal Grandmother 35    NOS  . Prostate cancer Maternal Grandfather     dx in his 93s  . Lung cancer Paternal Grandfather     dx <50  . Lymphoma Maternal Aunt     dx in her 38s  . Melanoma Cousin 44    maternal first cousin  . Brain cancer Cousin     paternal first cousin dx under 80  . Prostate cancer Other     MGF's father  . Colon cancer Other     MGM's mother  Her son lives in [place]. Her daughter lives in [place].  Review of Systems  Constitutional: Positive for malaise/fatigue. Negative for fever, chills, weight loss.  HENT: . Negative for congestion, hearing loss, sore throat and tinnitus.   Eyes: Positive for blurred vision. Negative for double vision, pain and discharge.  Respiratory:  Negative for cough, hemoptysis, sputum production, shortness of breath and wheezing.   Cardiovascular: Negative for chest pain, palpitations, claudication, and PND.  Gastrointestinal: Negative for heartburn, abdominal pain, diarrhea, constipation, blood in stool and melena, nausea, vomiting. Genitourinary:Negative for dysuria, urgency, frequency and hematuria.  Musculoskeletal: Negative for myalgias, joint pain and falls.  Skin: Negative for itching and rash.  Neurological: . Negative for tingling, tremors, speech change, focal weakness, seizures, loss of consciousness, weakness. Headaches or blurry vision Endo/Heme/Allergies: Does not bruise/bleed easily.  Psychiatric/Behavioral: Negative for suicidal ideas, memory loss and substance abuse, nervous/axiety, depression.  14 point review of systems was performed and is negative except as detailed under history of present illness and above  PHYSICAL EXAMINATION  ECOG PERFORMANCE STATUS: 1 - Symptomatic but completely ambulatory  Filed Vitals:   03/15/15 0832  BP: 133/74  Pulse: 79  Temp: 98.3 F (36.8 C)  Resp: 16    Physical Exam  Constitutional: She is oriented to person, place, and time and  well-developed, well-nourished, and in no distress. Facial Cushingoid features  Mood is good HENT:  Head: Normocephalic and atraumatic. Alopecia Nose: Nose normal.  Mouth/Throat:  Negative Eyes: Conjunctivae and EOM are normal. Pupils are equal, round, and reactive to light. Right eye exhibits no discharge. Left eye exhibits no discharge. No scleral icterus.  Neck: Normal range of motion. No tracheal deviation present. No thyromegaly present.  Cardiovascular: Normal rate, regular rhythm and normal heart sounds.  Exam reveals no gallop and no friction rub.   No murmur heard. Pulmonary/Chest: Effort normal and  breath sounds normal. She has no wheezes. She has no rales.  Abdominal: Soft. Bowel sounds are normal. She exhibits no distension and no mass. There is no tenderness. There is no rebound and no guarding.  Musculoskeletal: Normal range of motion.  Lymphadenopathy:    She has no cervical adenopathy.  Neurological: She is alert and oriented to person, place, and time. She has normal reflexes. No cranial nerve deficit. Gait normal. Coordination normal.  Skin: Skin is warm and dry. No rash noted.  Psychiatric: Mood, memory, affect and judgment normal.  Nursing note and vitals reviewed.  LABORATORY DATA: I have reviewed the data as listed.  CBC    Component Value Date/Time   WBC 10.1 02/15/2015 1103   RBC 3.87 02/15/2015 1103   HGB 12.5 02/15/2015 1103   HCT 39.1 02/15/2015 1103   PLT 168 02/15/2015 1103   MCV 101.0* 02/15/2015 1103   MCH 32.3 02/15/2015 1103   MCHC 32.0 02/15/2015 1103   RDW 14.9 02/15/2015 1103   LYMPHSABS 0.9 02/15/2015 1103   MONOABS 0.7 02/15/2015 1103   EOSABS 0.0 02/15/2015 1103   BASOSABS 0.0 02/15/2015 1103   CMP     Component Value Date/Time   NA 141 02/15/2015 1103   K 4.2 02/15/2015 1103   CL 104 02/15/2015 1103   CO2 29 02/15/2015 1103   GLUCOSE 138* 02/15/2015 1103   BUN 13 02/15/2015 1103   CREATININE 0.79 02/15/2015 1103   CALCIUM 8.5*  02/15/2015 1103   PROT 6.5 02/15/2015 1103   ALBUMIN 3.7 02/15/2015 1103   AST 18 02/15/2015 1103   ALT 37 02/15/2015 1103   ALKPHOS 30* 02/15/2015 1103   BILITOT 0.4 02/15/2015 1103   GFRNONAA >60 02/15/2015 1103   GFRAA >60 02/15/2015 1103    RADIOLOGY: CLINICAL DATA: Nausea, vomiting, and headache for 2 days. History of metastatic breast cancer. Fall at home.  EXAM: MRI HEAD WITHOUT AND WITH CONTRAST  TECHNIQUE: Multiplanar, multiecho pulse sequences of the brain and surrounding structures were obtained without and with intravenous contrast.  CONTRAST: 77m MULTIHANCE GADOBENATE DIMEGLUMINE 529 MG/ML IV SOLN  COMPARISON: 11/20/2014 IMPRESSION: 1. Decreased size of all brain metastases. No significant residual edema. No new lesions identified. 2. No acute intracranial abnormality.   Electronically Signed  By: ALogan Bores On: 12/24/2014 11:43   ASSESSMENT and THERAPY PLAN:  Stage IV ER positive, HER-2 positive carcinoma of the breast Widespread Brain Metastases End of Life issues Insomnia Anxiety Nausea/vomiting Declining EF presumably   Unfortunately her ejection fraction is about the same, in the 50's. We will  and see her back in 4 weeks. If it hasn't recovered by 8 weeks, additional Herceptin therapy. She is due for a brain MRI and PET imaging, we will order these today.  She will have a repeat MUGA in 4 weeks with follow-up post.  I consulted with her about a few options to combat her fatigue. She does not need refills today. She will receive a pneumonia shot today.  She knows to contact me if she thinks of anything else.  All questions were answered. The patient knows to call the clinic with any problems, questions or concerns. We can certainly see the patient much sooner if necessary.   This note was signed electronically  This document serves as a record of services personally performed by SAncil Linsey MD. It was created on her  behalf by KToni Amend a trained medical scribe. The creation of this record is based on the scribe's  personal observations and the provider's statements to them. This document has been checked and approved by the attending provider.  I have reviewed the above documentation for accuracy and completeness, and I agree with the above.  Kelby Fam. Whitney Muse, MD

## 2015-03-15 NOTE — Patient Instructions (Addendum)
[place] at [facility] Discharge Instructions  RECOMMENDATIONS MADE BY THE CONSULTANT AND ANY TEST RESULTS WILL BE SENT TO YOUR REFERRING PHYSICIAN.  Exam and discussion by Dr. Whitney Muse. Will give you pneumovax today Scans:   See schedule   PET   MRI of brain    Muga Xgeva today if calcium level is normal  Follow-up after Muga.    Thank you for choosing [place] at [facility] to provide your oncology and hematology care.  To afford each patient quality time with our provider, please arrive at least 15 minutes before your scheduled appointment time.    You need to re-schedule your appointment should you arrive 10 or more minutes late.  We strive to give you quality time with our providers, and arriving late affects you and other patients whose appointments are after yours.  Also, if you no show three or more times for appointments you may be dismissed from the clinic at the providers discretion.     Again, thank you for choosing [facility].  Our hope is that these requests will decrease the amount of time that you wait before being seen by our physicians.       _____________________________________________________________  Should you have questions after your visit to [facility], please contact our office at [phone] between the hours of 8:30 a.m. and 4:30 p.m.  Voicemails left after 4:30 p.m. will not be returned until the following business day.  For prescription refill requests, have your pharmacy contact our office.

## 2015-03-15 NOTE — Progress Notes (Signed)
[name] presents today for injection per MD orders. Pneumoccoccal 23 valent vaccine administered IM in left deltoid. Administration without incident. Patient tolerated well.

## 2015-03-15 NOTE — Progress Notes (Signed)
Joshua H Cangemi presents today for injection per MD orders. xgeva administered SQ in left Abdomen. Administration without incident. Patient tolerated well.

## 2015-03-15 NOTE — Progress Notes (Signed)
Chemotherapy held today, please doctors encounter for more information

## 2015-03-18 ENCOUNTER — Telehealth (HOSPITAL_COMMUNITY): Payer: Self-pay | Admitting: *Deleted

## 2015-03-22 ENCOUNTER — Telehealth (HOSPITAL_COMMUNITY): Payer: Self-pay | Admitting: *Deleted

## 2015-03-22 ENCOUNTER — Ambulatory Visit (HOSPITAL_COMMUNITY)
Admission: RE | Admit: 2015-03-22 | Discharge: 2015-03-22 | Disposition: A | Discharge status: Home / Self Care | Payer: 59 | Source: Ambulatory Visit | Attending: Hematology & Oncology | Admitting: Hematology & Oncology

## 2015-03-22 ENCOUNTER — Other Ambulatory Visit (HOSPITAL_COMMUNITY): Payer: Self-pay | Admitting: Hematology & Oncology

## 2015-03-22 DIAGNOSIS — C50919 Malignant neoplasm of unspecified site of unspecified female breast: Secondary | ICD-10-CM

## 2015-03-22 DIAGNOSIS — R930 Abnormal findings on diagnostic imaging of skull and head, not elsewhere classified: Secondary | ICD-10-CM | POA: Insufficient documentation

## 2015-03-22 DIAGNOSIS — W19XXXA Unspecified fall, initial encounter: Secondary | ICD-10-CM | POA: Diagnosis not present

## 2015-03-22 DIAGNOSIS — C7931 Secondary malignant neoplasm of brain: Secondary | ICD-10-CM

## 2015-03-26 ENCOUNTER — Encounter (HOSPITAL_COMMUNITY): Payer: 59

## 2015-03-29 ENCOUNTER — Ambulatory Visit (HOSPITAL_COMMUNITY)
Admission: RE | Admit: 2015-03-29 | Discharge: 2015-03-29 | Disposition: A | Discharge status: Home / Self Care | Payer: 59 | Source: Ambulatory Visit | Attending: Hematology & Oncology | Admitting: Hematology & Oncology

## 2015-03-29 DIAGNOSIS — C7951 Secondary malignant neoplasm of bone: Secondary | ICD-10-CM

## 2015-03-29 DIAGNOSIS — C50919 Malignant neoplasm of unspecified site of unspecified female breast: Secondary | ICD-10-CM | POA: Diagnosis not present

## 2015-03-29 DIAGNOSIS — Z0000 Encounter for general adult medical examination without abnormal findings: Secondary | ICD-10-CM | POA: Diagnosis not present

## 2015-03-29 LAB — GLUCOSE, CAPILLARY: GLUCOSE-CAPILLARY: 88 mg/dL (ref 65–99)

## 2015-03-29 MED ORDER — FLUDEOXYGLUCOSE F - 18 (FDG) INJECTION
15.5000 | Freq: Once | INTRAVENOUS | Status: DC | PRN
  Administered 2015-03-29: 15.5 via INTRAVENOUS
  Filled 2015-03-29: qty 15.5

## 2015-03-29 MED ORDER — GADOBENATE DIMEGLUMINE 529 MG/ML IV SOLN
20.0000 mL | Freq: Once | INTRAVENOUS | Status: AC | PRN
  Administered 2015-03-29: 20 mL via INTRAVENOUS

## 2015-04-01 ENCOUNTER — Encounter (HOSPITAL_COMMUNITY): Payer: Self-pay

## 2015-04-01 ENCOUNTER — Encounter (HOSPITAL_COMMUNITY)
Admission: RE | Admit: 2015-04-01 | Discharge: 2015-04-01 | Disposition: A | Discharge status: Home / Self Care | Payer: 59 | Source: Ambulatory Visit | Attending: Hematology & Oncology | Admitting: Hematology & Oncology

## 2015-04-01 ENCOUNTER — Encounter (HOSPITAL_COMMUNITY): Payer: Self-pay | Admitting: Hematology & Oncology

## 2015-04-01 DIAGNOSIS — C7951 Secondary malignant neoplasm of bone: Secondary | ICD-10-CM | POA: Diagnosis present

## 2015-04-01 DIAGNOSIS — Z0000 Encounter for general adult medical examination without abnormal findings: Secondary | ICD-10-CM | POA: Diagnosis present

## 2015-04-01 DIAGNOSIS — C50919 Malignant neoplasm of unspecified site of unspecified female breast: Secondary | ICD-10-CM | POA: Diagnosis present

## 2015-04-01 MED ORDER — TECHNETIUM TC 99M-LABELED RED BLOOD CELLS IV KIT
25.0000 | PACK | Freq: Once | INTRAVENOUS | Status: AC | PRN
  Administered 2015-04-01: 27 via INTRAVENOUS

## 2015-04-01 MED ORDER — HEPARIN SOD (PORK) LOCK FLUSH 100 UNIT/ML IV SOLN
INTRAVENOUS | Status: AC
  Filled 2015-04-01: qty 5

## 2015-04-02 ENCOUNTER — Telehealth (HOSPITAL_COMMUNITY): Payer: Self-pay | Admitting: *Deleted

## 2015-04-02 NOTE — Telephone Encounter (Signed)
Patient's husband states that patient is off of steroids and back to sleeping too much especially during the day. Thayer Jew states that her short term memory getting worse (more frequent periods of this). Patient is scheduled to see Dr. Whitney Muse on 04/03/15.

## 2015-04-02 NOTE — Telephone Encounter (Signed)
Spoke with [name] and arranged for her to come in tomorrow 11/23 for appt with Dr. Whitney Muse to review mugga scan.

## 2015-04-03 ENCOUNTER — Encounter (HOSPITAL_COMMUNITY): Payer: Self-pay | Admitting: Hematology & Oncology

## 2015-04-03 ENCOUNTER — Ambulatory Visit (HOSPITAL_COMMUNITY): Admission: RE | Admit: 2015-04-03 | Payer: 59 | Source: Ambulatory Visit

## 2015-04-03 ENCOUNTER — Encounter (HOSPITAL_BASED_OUTPATIENT_CLINIC_OR_DEPARTMENT_OTHER): Payer: 59 | Admitting: Hematology & Oncology

## 2015-04-03 ENCOUNTER — Ambulatory Visit (HOSPITAL_COMMUNITY): Payer: 59

## 2015-04-03 VITALS — BP 106/85 | HR 106 | Temp 97.0°F | Resp 18 | Wt 272.2 lb

## 2015-04-03 DIAGNOSIS — C7951 Secondary malignant neoplasm of bone: Secondary | ICD-10-CM | POA: Diagnosis not present

## 2015-04-03 DIAGNOSIS — C50919 Malignant neoplasm of unspecified site of unspecified female breast: Secondary | ICD-10-CM

## 2015-04-03 MED ORDER — METHYLPHENIDATE HCL 10 MG PO TABS: 10.0000 mg | ORAL_TABLET | Freq: Two times a day (BID) | ORAL | Status: DC

## 2015-04-03 MED ORDER — MORPHINE SULFATE ER 30 MG PO TBCR: 15.0000 mg | EXTENDED_RELEASE_TABLET | Freq: Two times a day (BID) | ORAL | Status: DC

## 2015-04-03 MED ORDER — MORPHINE SULFATE ER 15 MG PO TBCR
60.0000 mg | EXTENDED_RELEASE_TABLET | Freq: Two times a day (BID) | ORAL | Status: DC
Start: 2015-04-03 — End: 2015-05-03

## 2015-04-03 NOTE — Patient Instructions (Addendum)
[place] at [facility] Discharge Instructions  RECOMMENDATIONS MADE BY THE CONSULTANT AND ANY TEST RESULTS WILL BE SENT TO YOUR REFERRING PHYSICIAN.    Exam completed by Dr Whitney Muse today Ritalin prescription you can start today if you want Morphine prescription given Prescription given for shingles vaccine We will re-challenge you with Herceptin Muga after 2 treatments of herceptin We will put in the referral to cancer rehab Return Friday for herceptin, to see the doctor and x-geva Please call Hildred Alamin if you have any problems Decrease Morphine to 15 mg every 12 hours. Please call the clinic if you have any questions or concerns     Thank you for choosing [place] at [facility] to provide your oncology and hematology care.  To afford each patient quality time with our provider, please arrive at least 15 minutes before your scheduled appointment time.    You need to re-schedule your appointment should you arrive 10 or more minutes late.  We strive to give you quality time with our providers, and arriving late affects you and other patients whose appointments are after yours.  Also, if you no show three or more times for appointments you may be dismissed from the clinic at the providers discretion.     Again, thank you for choosing [facility].  Our hope is that these requests will decrease the amount of time that you wait before being seen by our physicians.       _____________________________________________________________  Should you have questions after your visit to [facility], please contact our office at [phone] between the hours of 8:30 a.m. and 4:30 p.m.  Voicemails left after 4:30 p.m. will not be returned until the following business day.  For prescription refill requests, have your pharmacy contact our office.

## 2015-04-03 NOTE — Progress Notes (Signed)
Allison Labrum, MD [place] [address]    Breast cancer, stage 4 ([place])   04/25/2014 Initial Diagnosis Breast cancer, stage 4   04/25/2014 Imaging CT abdomen/pelvia with widespread metastatic disease to the liver, multiple lytic lesions throughout spine and pelvis. No FX or epidural tumor identified   04/26/2014 Imaging CT head unremarkable   04/26/2014 Imaging CT chest with no lung mass or pulmonary nodules, no adenopathy. Lytic bone lesions, right 2nd rib   04/27/2014 Initial Biopsy U/S guided liver biopsy, lesion in anterior and inferior left hepatic lobe biopsied   04/27/2014 Pathology Results Metastatic adenocarcinoma, CK7, ER+, patchy positivity with PR. Possible primary includes breast, less likely gynecologic   05/15/2014 Mammogram BI-RADS CATEGORY  2: Benign Finding(s)   05/16/2014 PET scan 1. Intensely hypermetabolic hepatic metastasis. 2. Widespread hypermetabolic skeletal lesions. 3. No primary adenocarcinoma identified by FDG PET imaging.   05/19/2014 Imaging MUGA- Left ventricular ejection fracture greater than 70%.   05/21/2014 Breast MRI No suspicious masses or enhancement within the breasts. No axillary adenopathy.   05/22/2014 - 07/03/2014 Antibody Plan Herceptin/Perjeta/Tamoxifen   06/12/2014 - 07/03/2014 Chemotherapy Taxotere added secondary to persistent abdominal and back pain   06/17/2014 - 06/19/2014 Hospital Admission Neutropenia, fever, diarrhea, nausea, vomiting   06/20/2014 - 07/10/2014 Radiation Therapy Dr. Thea Silversmith 12 fractions to L3-S3 (30 Gy) and left scapula (20 Gy).    07/03/2014 Adverse Reaction Perjeta- induced diarrhea.  Perjeta discontinued   07/16/2014 - 07/20/2014 Hospital Admission Electrolyte abnormalities, and diarrhea.  Suspect Perjeta-induced diarrhea.  Negative GI work-up.   07/24/2014 -  Chemotherapy Herceptin/Tamoxifen/Xgeva   08/21/2014 Imaging MUGA- Left ventricular ejection fraction equals 71%.   08/24/2014 PET scan Dramatic reduction in  metabolic activity of the widespread liver metastasis. Liver metastasis now have metabolic activity equal to background normal liver activity. Liver has a nodular contour. Marked reduction in metabolic activity of skeletal lesions..   10/05/2014 Progression Widespread metastatic disease to the brain as described. Between 20 and 30 intracranial metastatic deposits are now seen. No midline shift or incipient herniation   10/09/2014 - 10/26/2014 Radiation Therapy Whole Brain XRT   11/14/2014 Imaging MUGA- LVEF 67%   02/13/2015 Imaging MUGA- LVEF 59%   02/15/2015 Treatment Plan Change Due to declining LVEF, will hold Herceptin per PI guidelines.   Stage IV adenocarcinoma with metastases to liver and bone  Had last mammogram in [place] this year, Dr. Gaetano Net  Colonoscopy 05/18/2013  Breast Reduction 03/17/2011  CURRENT THERAPY: Herceptin/Tamoxifen/XGEVA   INTERVAL HISTORY: [name] 49 y.o. female returns for follow-up of stage IV adenocarcinoma of the breast, ER+, HER 2 + disease. She continues on Tamoxifen and XGEVA. Herceptin has been held secondary to a decline in her EF. She is here today to review her repeat MUGA.  She is here today with her husband, Thayer Jew.  Her hair is growing back.  Osie states "I am so tired." she says she's sleeping twelve hours, and she "doesnt' do that." She can't stand not having energy.  She remarks that she's been short of breath a lot, and that she had really bad chest pain on Sunday night. She says sometimes she'll just go to the kitchen and get short of breath; "taking a shower is definitely the big one;" [facility] walked [facility] on Saturday and she says she was so exhausted from that.  She says she's active and "feels like she's going all the time." She says she goes to a lot of  the classes in [place]; she's been going to hospice. She went to [facility] this month; went to a weaving class; went to the grocery store yesterday.  In terms of pain medicine,  she says she doesn't take the oxycodone regularly, unless she's having really bad back pain. She does wonder about the morphine and states she'd be willing to cut back on anything that would help with her fatigue.   She states that she is not eating a lot, and that nothing tastes very good. Thayer Jew adds that she "fills up very quickly."  She denies any burning during urination.   She has had her flu shot and everything else, and questions whether she should get the shingles vaccine.   MEDICAL HISTORY: Past Medical History  Diagnosis Date  . PONV (postoperative nausea and vomiting)     pt states scope patch does well  . Anemia   . Depression   . Anxiety   . Headache(784.0)     has migraines - medication controls  . Family history of prostate cancer   . Hot flashes due to tamoxifen 09/25/2014  . Breast cancer ([place]) 04/2014    Presumed dx; stage 4 w/ mets to bone and liver, brain lesions  . Bony metastasis ([place])   . S/P small bowel resection   . Drug-induced cardiomyopathy ([place]) 02/15/2015    has Hyperlipidemia; Depression; Breast cancer, stage 4 ([place]); Bone metastases ([place]); DVT (deep venous thrombosis) ([place]); GERD (gastroesophageal reflux disease); Family history of prostate cancer; Genetic testing; Hot flashes due to tamoxifen; Brain metastases ([place]); and Drug-induced cardiomyopathy ([place]) on her problem list.      has No Known Allergies.  We administered pneumococcal 23 valent vaccine.  SURGICAL HISTORY: Past Surgical History  Procedure Laterality Date  . Right rotator cuff      2002  . Neck fusion      2003  . Laparoscopic cholecystectomy      2004  . Right knee arthroscopy      2005  . Appendectomy      2008  . Abdominal hysterectomy    . Breast reduction surgery  03/17/2011    Procedure: MAMMARY REDUCTION BILATERAL (BREAST);  Surgeon: Mary A Contogiannis;  Location: [facility];  Service: Plastics;  Laterality: Bilateral;  . Colonoscopy N/A 07/13/2013     Procedure: COLONOSCOPY;  Surgeon: Rogene Houston, MD;  [facility];  Service: Endoscopy;  Laterality: N/A;  930  . Liver biopsy  04/2014  . Portacath placement    . Esophagogastroduodenoscopy N/A 05/25/2014    Procedure: ESOPHAGOGASTRODUODENOSCOPY (EGD);  Surgeon: Rogene Houston, MD;  [facility];  Service: Endoscopy;  Laterality: N/A;  155  . Laparoscopic appendectomy    . Colonoscopy N/A 11/26/2014    Procedure: COLONOSCOPY;  Surgeon: Rogene Houston, MD;  [facility];  Service: Endoscopy;  Laterality: N/A;  730    SOCIAL HISTORY: Social History   Social History  . Marital Status: Married    Spouse Name: N/A  . Number of Children: N/A  . Years of Education: N/A   Occupational History  . Not on file.   Social History Main Topics  . Smoking status: Former Smoker    Types: Cigarettes    Quit date: 08/20/1994  . Smokeless tobacco: Never Used  . Alcohol Use: Yes     Comment: Occasionally  . Drug Use: No  . Sexual Activity: Yes    Birth Control/ Protection: Surgical  Other Topics Concern  . Not on file   Social History Narrative    FAMILY HISTORY: Family History  Problem Relation Age of Onset  . Diabetes Father   . Heart attack Maternal Grandmother 30    multiple over lifetime.  . Cancer Maternal Grandmother 42    NOS  . Prostate cancer Maternal Grandfather     dx in his 28s  . Lung cancer Paternal Grandfather     dx <50  . Lymphoma Maternal Aunt     dx in her 40s  . Melanoma Cousin 80    maternal first cousin  . Brain cancer Cousin     paternal first cousin dx under 62  . Prostate cancer Other     MGF's father  . Colon cancer Other     MGM's mother  Her son lives in [place]. Her daughter lives in [place].  Review of Systems  Constitutional: Positive for malaise/fatigue. Negative for fever, chills, weight loss.  HENT: . Negative for congestion, hearing loss, sore throat and tinnitus.   Eyes: Positive for blurred  vision. Negative for double vision, pain and discharge.  Respiratory:  Negative for cough, hemoptysis, sputum production, shortness of breath and wheezing.   Cardiovascular: Negative for chest pain, palpitations, claudication, and PND.  Gastrointestinal: Negative for heartburn, abdominal pain, diarrhea, constipation, blood in stool and melena, nausea, vomiting. Genitourinary:Negative for dysuria, urgency, frequency and hematuria.  Musculoskeletal: Negative for myalgias, joint pain and falls.  Skin: Negative for itching and rash.  Neurological: . Negative for tingling, tremors, speech change, focal weakness, seizures, loss of consciousness, weakness. Headaches or blurry vision Endo/Heme/Allergies: Does not bruise/bleed easily.  Psychiatric/Behavioral: Negative for suicidal ideas, memory loss and substance abuse, nervous/axiety, depression.  14 point review of systems was performed and is negative except as detailed under history of present illness and above  PHYSICAL EXAMINATION  ECOG PERFORMANCE STATUS: 1 - Symptomatic but completely ambulatory  Filed Vitals:   04/03/15 0949  BP: 106/85  Pulse: 106  Temp: 97 F (36.1 C)  Resp: 18    Physical Exam  Constitutional: She is oriented to person, place, and time and well-developed, well-nourished, and in no distress. Facial Cushingoid features  Mood is good HENT:  Head: Normocephalic and atraumatic. Hair regrowth. Nose: Nose normal.  Mouth/Throat:  Negative Eyes: Conjunctivae and EOM are normal. Pupils are equal, round, and reactive to light. Right eye exhibits no discharge. Left eye exhibits no discharge. No scleral icterus.  Neck: Normal range of motion. No tracheal deviation present. No thyromegaly present.  Cardiovascular: Normal rate, regular rhythm and normal heart sounds.  Exam reveals no gallop and no friction rub.   No murmur heard. Pulmonary/Chest: Effort normal and breath sounds normal. She has no wheezes. She has no rales.    Abdominal: Soft. Bowel sounds are normal. She exhibits no distension and no mass. There is no tenderness. There is no rebound and no guarding.  Musculoskeletal: Normal range of motion.  Lymphadenopathy:    She has no cervical adenopathy.  Neurological: She is alert and oriented to person, place, and time. She has normal reflexes. No cranial nerve deficit. Gait normal. Coordination normal.  Skin: Skin is warm and dry. No rash noted.  Psychiatric: Mood, memory, affect and judgment normal.  Nursing note and vitals reviewed.  LABORATORY DATA: I have reviewed the data as listed.  CBC    Component Value Date/Time   WBC 10.1 02/15/2015 1103   RBC 3.87 02/15/2015 1103   HGB  12.5 02/15/2015 1103   HCT 39.1 02/15/2015 1103   PLT 168 02/15/2015 1103   MCV 101.0* 02/15/2015 1103   MCH 32.3 02/15/2015 1103   MCHC 32.0 02/15/2015 1103   RDW 14.9 02/15/2015 1103   LYMPHSABS 0.9 02/15/2015 1103   MONOABS 0.7 02/15/2015 1103   EOSABS 0.0 02/15/2015 1103   BASOSABS 0.0 02/15/2015 1103   CMP     Component Value Date/Time   NA 138 03/15/2015 1008   K 3.4* 03/15/2015 1008   CL 104 03/15/2015 1008   CO2 26 03/15/2015 1008   GLUCOSE 140* 03/15/2015 1008   BUN 17 03/15/2015 1008   CREATININE 0.73 03/15/2015 1008   CALCIUM 9.0 03/15/2015 1008   PROT 6.2* 03/15/2015 1008   ALBUMIN 3.6 03/15/2015 1008   AST 24 03/15/2015 1008   ALT 36 03/15/2015 1008   ALKPHOS 37* 03/15/2015 1008   BILITOT 0.5 03/15/2015 1008   GFRNONAA >60 03/15/2015 1008   GFRAA >60 03/15/2015 1008    RADIOLOGY: Study Result     CLINICAL DATA: Stage IV breast cancer. Recent fall with abnormal head CT suggesting skullbase metastasis.  EXAM: MRI HEAD WITHOUT AND WITH CONTRAST  TECHNIQUE: Multiplanar, multiecho pulse sequences of the brain and surrounding structures were obtained without and with intravenous contrast.  CONTRAST: 7m MULTIHANCE GADOBENATE DIMEGLUMINE 529 MG/ML IV SOLN  COMPARISON: CT  03/22/2015. MRI 12/24/2014.  FINDINGS: Diffusion imaging does not show any acute or subacute infarction. 6 mm metastatic focus to the left of midline in the clivus is stable over time. No extraosseous extension.  Known brain metastases all continue to get smaller. The largest lesion in the caudate head on the right has decreased in size from 5.4 mm to 3.2 mm. A few other punctate metastases are seen within the cerebral hemispheres and within the cerebellum, all getting smaller.  No evidence of intracranial hemorrhage, hydrocephalus or extra-axial collection. No new or progressive lesions. Small amount of fluid in the mastoid air cells bilaterally.  IMPRESSION: No acute intracranial finding. Continued improvement with reduction in size of all intracranial metastatic lesions. Many are no longer visible at all. The largest lesion in the right caudate head has decreased in size from 5.4 mm to 3.2 mm.  Small metastasis in the left side of the clivus is stable.   Electronically Signed  By: MNelson ChimesM.D.  On: 03/29/2015 16:56   Study Result     CLINICAL DATA: Subsequent treatment strategy for breast cancer.  EXAM: NUCLEAR MEDICINE PET SKULL BASE TO THIGH  TECHNIQUE: 15.5 mCi F-18 FDG was injected intravenously. Full-ring PET imaging was performed from the skull base to thigh after the radiotracer. CT data was obtained and used for attenuation correction and anatomic localization.  FASTING BLOOD GLUCOSE: Value: 88 mg/dl  COMPARISON: 11/01/2014.  FINDINGS: NECK  No hypermetabolic lymph nodes in the neck.  CHEST  No hypermetabolic mediastinal or hilar nodes. No suspicious pulmonary nodules on the CT scan.  Right Port-A-Cath tip is at the distal SVC level.  ABDOMEN/PELVIS  No abnormal hypermetabolic activity within the liver, pancreas, adrenal glands, or spleen. No hypermetabolic lymph nodes in the abdomen or pelvis.  Liver contour  remains nodular with parenchyma heterogeneous in appearance. As described previously this remains compatible with treated hepatic metastases.  9 mm nodule in the left anterior abdominal wall subcutaneous fat shows mild hypermetabolism. This would be an unusual location for metastatic disease and is felt to be most likely related to infection/inflammation or injection granuloma.  SKELETON  No focal hypermetabolic activity to suggest skeletal metastasis. Bone windows show innumerable sclerotic lesions throughout the skeleton which are stable. Index lesion in the L4 vertebral body measures 16 mm today, unchanged since previous. The fracture of the left acromion is again noted and FDG uptake in the region of the fracture is presumably related to healing.  IMPRESSION: 1. Stable exam. No definite evidence for hypermetabolic metastases. There is a small nodule in the left anterior abdominal wall subcutaneous fat that shows low level FDG uptake, likely related to small focus of infection/inflammation or injection granuloma. Continued attention to this area on followup is recommended. 2. Healing fracture of the left acromion again noted. Uptake in this region compatible with granulation. 3. Nodular heterogeneous liver compatible with treated metastases. No focal hypermetabolism today to suggest hypermetabolic liver disease.   Electronically Signed  By: Misty Stanley M.D.  On: 03/29/2015 09:50   Study Result     CLINICAL DATA: Fall yesterday, history of breast carcinoma with metastatic disease to the brain  EXAM: CT HEAD WITHOUT CONTRAST  TECHNIQUE: Contiguous axial images were obtained from the base of the skull through the vertex without intravenous contrast.  COMPARISON: 12/24/2014 04/26/2014  FINDINGS: The bony calvarium is intact some bony sclerosis in the clivus just to the left of the midline may represent bony metastatic disease. No findings to suggest  acute hemorrhage, acute infarction or space-occupying mass lesion are noted. The known brain metastatic lesions are not well appreciated on this study.  IMPRESSION: Possible bony metastatic disease in the skullbase.  No acute abnormality is seen.   Electronically Signed  By: Inez Catalina M.D.  On: 03/22/2015 13:31   CLINICAL DATA: Breast cancer. Cardiotoxic chemotherapy.  EXAM: NUCLEAR MEDICINE CARDIAC BLOOD POOL IMAGING (MUGA)  TECHNIQUE: Cardiac multi-gated acquisition was performed at rest following intravenous injection of Tc-58mlabeled red blood cells.  RADIOPHARMACEUTICALS: 27 mCi Tc-92mDP in-vitro labeled red blood cells IV  COMPARISON: 03/13/2015  FINDINGS: The left ventricular ejection fraction equals 60.3%. There is normal left ventricular wall motion. On the previous exam the left ventricular ejection fraction equaled 56%.  IMPRESSION: 1. Normal left ventricular ejection fraction equal to 60.3%. Not significantly changed from previous exams.   Electronically Signed  By: TaKerby Moors.D.  On: 04/01/2015 14:41   ASSESSMENT and THERAPY PLAN:  Stage IV ER positive, HER-2 positive carcinoma of the breast Widespread Brain Metastases End of Life issues Insomnia Anxiety UE DVT Cancer related Fatigue Decline in EF, Herceptin held. Herceptin restarted on 04/12/2015  After a long discussion we opted to proceed with re-institution of herceptin. Her disease remains well controlled. I again reviewed all imaging studies in detail.  We will continue forward with close monitoring of her EF moving forward. She is to continue on Tamoxifen/XGEVA.  Unfortunately she has a lot of side effects from whole brain, specifically difficulties with memory which frustrate her. She has a lot of cancer related fatigue. We spent time today discussing this and I advised her that physical activity is the best thing she can do to try to combat her fatigue.  She  would like a referral into PT. We will arrange that for her.   She has tried decreasing her pain medications but finds her current dose keeps her pain controlled. We can try ritalin, controversial but can benefit some patients.  She will return prior to her next herceptin dose for further follow-up.   Orders Placed This Encounter  Procedures  . Ambulatory referral to Physical Therapy  Referral Priority:  Routine    Referral Type:  Physical Medicine    Referral Reason:  Specialty Services Required    Requested Specialty:  Physical Therapy    Number of Visits Requested:  1  . Ambulatory referral to Occupational Therapy    Referral Priority:  Routine    Referral Type:  Occupational Therapy    Referral Reason:  Specialty Services Required    Requested Specialty:  Occupational Therapy    Number of Visits Requested:  1   All questions were answered. The patient knows to call the clinic with any problems, questions or concerns. We can certainly see the patient much sooner if necessary.   This note was signed electronically.  This document serves as a record of services personally performed by Ancil Linsey, MD. It was created on her behalf by Toni Amend, a trained medical scribe. The creation of this record is based on the scribe's personal observations and the provider's statements to them. This document has been checked and approved by the attending provider.  I have reviewed the above documentation for accuracy and completeness, and I agree with the above.  Kelby Fam. Whitney Muse, MD

## 2015-04-08 ENCOUNTER — Inpatient Hospital Stay (HOSPITAL_COMMUNITY): Payer: 59

## 2015-04-09 ENCOUNTER — Telehealth (HOSPITAL_COMMUNITY): Payer: Self-pay | Admitting: Oncology

## 2015-04-09 NOTE — Telephone Encounter (Signed)
Called pt to let her know that the ZOSTAVAX vaccine was denied by ins and they will not pay

## 2015-04-10 ENCOUNTER — Encounter (HOSPITAL_COMMUNITY): Payer: 59

## 2015-04-10 NOTE — Progress Notes (Signed)
[name], MD [place] [address]    Breast cancer, stage 4 ([place])   04/25/2014 Initial Diagnosis Breast cancer, stage 4   04/25/2014 Imaging CT abdomen/pelvia with widespread metastatic disease to the liver, multiple lytic lesions throughout spine and pelvis. No FX or epidural tumor identified   04/26/2014 Imaging CT head unremarkable   04/26/2014 Imaging CT chest with no lung mass or pulmonary nodules, no adenopathy. Lytic bone lesions, right 2nd rib   04/27/2014 Initial Biopsy U/S guided liver biopsy, lesion in anterior and inferior left hepatic lobe biopsied   04/27/2014 Pathology Results Metastatic adenocarcinoma, CK7, ER+, patchy positivity with PR. Possible primary includes breast, less likely gynecologic   05/15/2014 Mammogram BI-RADS CATEGORY  2: Benign Finding(s)   05/16/2014 PET scan 1. Intensely hypermetabolic hepatic metastasis. 2. Widespread hypermetabolic skeletal lesions. 3. No primary adenocarcinoma identified by FDG PET imaging.   05/19/2014 Imaging MUGA- Left ventricular ejection fracture greater than 70%.   05/21/2014 Breast MRI No suspicious masses or enhancement within the breasts. No axillary adenopathy.   05/22/2014 - 07/03/2014 Antibody Plan Herceptin/Perjeta/Tamoxifen   06/12/2014 - 07/03/2014 Chemotherapy Taxotere added secondary to persistent abdominal and back pain   06/17/2014 - 06/19/2014 Hospital Admission Neutropenia, fever, diarrhea, nausea, vomiting   06/20/2014 - 07/10/2014 Radiation Therapy Dr. Thea Silversmith 12 fractions to L3-S3 (30 Gy) and left scapula (20 Gy).    07/03/2014 Adverse Reaction Perjeta- induced diarrhea.  Perjeta discontinued   07/16/2014 - 07/20/2014 Hospital Admission Electrolyte abnormalities, and diarrhea.  Suspect Perjeta-induced diarrhea.  Negative GI work-up.   07/24/2014 -  Chemotherapy Herceptin/Tamoxifen/Xgeva   08/21/2014 Imaging MUGA- Left ventricular ejection fraction equals 71%.   08/24/2014 PET scan Dramatic reduction in  metabolic activity of the widespread liver metastasis. Liver metastasis now have metabolic activity equal to background normal liver activity. Liver has a nodular contour. Marked reduction in metabolic activity of skeletal lesions..   10/05/2014 Progression Widespread metastatic disease to the brain as described. Between 20 and 30 intracranial metastatic deposits are now seen. No midline shift or incipient herniation   10/09/2014 - 10/26/2014 Radiation Therapy Whole Brain XRT   11/14/2014 Imaging MUGA- LVEF 67%   02/13/2015 Imaging MUGA- LVEF 59%   02/15/2015 Treatment Plan Change Due to declining LVEF, will hold Herceptin per PI guidelines.    Stage IV adenocarcinoma with metastases to liver and bone  Had last mammogram in [place] this year, Dr. Gaetano Net  Colonoscopy 05/18/2013  Breast Reduction 03/17/2011  CURRENT THERAPY: Herceptin/Tamoxifen/XGEVA   INTERVAL HISTORY: [name] 49 y.o. female returns for follow-up of stage IV adenocarcinoma of the breast, ER+, HER 2 + disease. She continues on Tamoxifen and XGEVA. Herceptin has been held secondary to a decline in her EF. She is here today to restart.   We made multiple changes to her medications including decreasing her pain medications, completely discontinuing her steroids (she absolutely does not like taking this); she also started ritalin.  She thinks the ritalin helps. She noticed an increase in her back pain and had to restart her prior pain meds. She continues to have fatigue and admits that it is simply hard to accept her "bad days." Overall however, she is active. Things in general are improving in regards to family relationships and moving forward with her disease.   She is here to restart herceptin.  Plan is to do another MUGA after 2 cycles.  MEDICAL HISTORY: Past Medical History  Diagnosis Date  . PONV (postoperative nausea  and vomiting)     pt states scope patch does well  . Anemia   . Depression   . Anxiety   .  Headache(784.0)     has migraines - medication controls  . Family history of prostate cancer   . Hot flashes due to tamoxifen 09/25/2014  . Breast cancer ([place]) 04/2014    Presumed dx; stage 4 w/ mets to bone and liver, brain lesions  . Bony metastasis ([place])   . S/P small bowel resection   . Drug-induced cardiomyopathy ([place]) 02/15/2015    has Hyperlipidemia; Depression; Breast cancer, stage 4 ([place]); Bone metastases ([place]); DVT (deep venous thrombosis) ([place]); GERD (gastroesophageal reflux disease); Family history of prostate cancer; Genetic testing; Hot flashes due to tamoxifen; Brain metastases ([place]); and Drug-induced cardiomyopathy ([place]) on her problem list.      has No Known Allergies.  [name] does not currently have medications on file.  SURGICAL HISTORY: Past Surgical History  Procedure Laterality Date  . Right rotator cuff      2002  . Neck fusion      2003  . Laparoscopic cholecystectomy      2004  . Right knee arthroscopy      2005  . Appendectomy      2008  . Abdominal hysterectomy    . Breast reduction surgery  03/17/2011    Procedure: MAMMARY REDUCTION BILATERAL (BREAST);  Surgeon: Mary A Contogiannis;  Location: [facility];  Service: Plastics;  Laterality: Bilateral;  . Colonoscopy N/A 07/13/2013    Procedure: COLONOSCOPY;  Surgeon: Rogene Houston, MD;  [facility];  Service: Endoscopy;  Laterality: N/A;  930  . Liver biopsy  04/2014  . Portacath placement    . Esophagogastroduodenoscopy N/A 05/25/2014    Procedure: ESOPHAGOGASTRODUODENOSCOPY (EGD);  Surgeon: Rogene Houston, MD;  [facility];  Service: Endoscopy;  Laterality: N/A;  155  . Laparoscopic appendectomy    . Colonoscopy N/A 11/26/2014    Procedure: COLONOSCOPY;  Surgeon: Rogene Houston, MD;  [facility];  Service: Endoscopy;  Laterality: N/A;  730    SOCIAL HISTORY: Social History   Social History  . Marital Status: Married    Spouse Name: N/A  .  Number of Children: N/A  . Years of Education: N/A   Occupational History  . Not on file.   Social History Main Topics  . Smoking status: Former Smoker    Types: Cigarettes    Quit date: 08/20/1994  . Smokeless tobacco: Never Used  . Alcohol Use: Yes     Comment: Occasionally  . Drug Use: No  . Sexual Activity: Yes    Birth Control/ Protection: Surgical   Other Topics Concern  . Not on file   Social History Narrative    FAMILY HISTORY: Family History  Problem Relation Age of Onset  . Diabetes Father   . Heart attack Maternal Grandmother 30    multiple over lifetime.  . Cancer Maternal Grandmother 50    NOS  . Prostate cancer Maternal Grandfather     dx in his 65s  . Lung cancer Paternal Grandfather     dx <50  . Lymphoma Maternal Aunt     dx in her 25s  . Melanoma Cousin 77    maternal first cousin  . Brain cancer Cousin     paternal first cousin dx under 75  . Prostate cancer Other     MGF's father  . Colon cancer  Other     MGM's mother  Her son lives in [place]. Her daughter lives in [place].  Review of Systems  Constitutional: Positive for malaise/fatigue. Negative for fever, chills, weight loss.  HENT: . Negative for congestion, hearing loss, sore throat and tinnitus.   Eyes:. Negative for double vision, pain and discharge.  Respiratory:  Negative for cough, hemoptysis, sputum production, shortness of breath and wheezing.   Cardiovascular: Negative for chest pain, palpitations, claudication, and PND.  Gastrointestinal: Negative for heartburn, abdominal pain, diarrhea, constipation, blood in stool and melena, nausea, vomiting. Genitourinary:Negative for dysuria, urgency, frequency and hematuria.  Musculoskeletal: Negative for myalgias, joint pain and falls.  Skin: Negative for itching and rash.  Neurological: . Negative for tingling, tremors, speech change, focal weakness, seizures, loss of consciousness, weakness. Headaches or blurry vision  Difficulty  with memory at times. Endo/Heme/Allergies: Does not bruise/bleed easily.  Psychiatric/Behavioral: Negative for suicidal ideas, memory loss and substance abuse, nervous/axiety, depression.  14 point review of systems was performed and is negative except as detailed under history of present illness and above  PHYSICAL EXAMINATION  ECOG PERFORMANCE STATUS: 1 - Symptomatic but completely ambulatory  Filed Vitals:   04/12/15 1039  BP: 123/75  Pulse: 103  Temp: 98.3 F (36.8 C)  Resp: 16    Physical Exam  Constitutional: She is oriented to person, place, and time and well-developed, well-nourished, and in no distress. Facial Cushingoid features  Mood is good HENT:  Head: Normocephalic and atraumatic. Alopecia Nose: Nose normal.  Mouth/Throat:  Negative Eyes: Conjunctivae and EOM are normal. Pupils are equal, round, and reactive to light. Right eye exhibits no discharge. Left eye exhibits no discharge. No scleral icterus.  Neck: Normal range of motion. No tracheal deviation present. No thyromegaly present.  Cardiovascular: Normal rate, regular rhythm and normal heart sounds.  Exam reveals no gallop and no friction rub.   No murmur heard. Pulmonary/Chest: Effort normal and breath sounds normal. She has no wheezes. She has no rales.  Abdominal: Soft. Bowel sounds are normal. She exhibits no distension and no mass. There is no tenderness. There is no rebound and no guarding.  Musculoskeletal: Normal range of motion.  Lymphadenopathy:    She has no cervical adenopathy.  Neurological: She is alert and oriented to person, place, and time. She has normal reflexes. No cranial nerve deficit. Gait normal. Coordination normal.  Skin: Skin is warm and dry. No rash noted.  Psychiatric: Mood, memory, affect and judgment normal.  Nursing note and vitals reviewed.  LABORATORY DATA: I have reviewed the data as listed.  CBC    Component Value Date/Time   WBC 4.3 04/12/2015 1039   RBC 3.81*  04/12/2015 1039   HGB 12.4 04/12/2015 1039   HCT 37.2 04/12/2015 1039   PLT 205 04/12/2015 1039   MCV 97.6 04/12/2015 1039   MCH 32.5 04/12/2015 1039   MCHC 33.3 04/12/2015 1039   RDW 13.8 04/12/2015 1039   LYMPHSABS 0.7 04/12/2015 1039   MONOABS 0.5 04/12/2015 1039   EOSABS 0.0 04/12/2015 1039   BASOSABS 0.0 04/12/2015 1039   CMP     Component Value Date/Time   NA 140 04/12/2015 1039   K 3.4* 04/12/2015 1039   CL 105 04/12/2015 1039   CO2 26 04/12/2015 1039   GLUCOSE 134* 04/12/2015 1039   BUN 6 04/12/2015 1039   CREATININE 0.73 04/12/2015 1039   CALCIUM 8.5* 04/12/2015 1039   PROT 6.0* 04/12/2015 1039   ALBUMIN 3.4* 04/12/2015 1039  AST 37 04/12/2015 1039   ALT 34 04/12/2015 1039   ALKPHOS 30* 04/12/2015 1039   BILITOT 0.4 04/12/2015 1039   GFRNONAA >60 04/12/2015 1039   GFRAA >60 04/12/2015 1039    RADIOLOGY: I have personally reviewed the radiological images as listed and agreed with the findings in the report.  CLINICAL DATA: Stage IV breast cancer. Recent fall with abnormal head CT suggesting skullbase metastasis.  EXAM: MRI HEAD WITHOUT AND WITH CONTRAST  TECHNIQUE: Multiplanar, multiecho pulse sequences of the brain and surrounding structures were obtained without and with intravenous contrast.  CONTRAST: 40m MULTIHANCE GADOBENATE DIMEGLUMINE 529 MG/ML IV SOLN  COMPARISON: CT 03/22/2015. MRI 12/24/2014.  FINDINGS: Diffusion imaging does not show any acute or subacute infarction. 6 mm metastatic focus to the left of midline in the clivus is stable over time. No extraosseous extension.  Known brain metastases all continue to get smaller. The largest lesion in the caudate head on the right has decreased in size from 5.4 mm to 3.2 mm. A few other punctate metastases are seen within the cerebral hemispheres and within the cerebellum, all getting smaller.  No evidence of intracranial hemorrhage, hydrocephalus or extra-axial collection. No  new or progressive lesions. Small amount of fluid in the mastoid air cells bilaterally.  IMPRESSION: No acute intracranial finding. Continued improvement with reduction in size of all intracranial metastatic lesions. Many are no longer visible at all. The largest lesion in the right caudate head has decreased in size from 5.4 mm to 3.2 mm.  Small metastasis in the left side of the clivus is stable.   Electronically Signed  By: MNelson ChimesM.D.  On: 03/29/2015 16:56   CLINICAL DATA: Subsequent treatment strategy for breast cancer.  EXAM: NUCLEAR MEDICINE PET SKULL BASE TO THIGH  TECHNIQUE: 15.5 mCi F-18 FDG was injected intravenously. Full-ring PET imaging was performed from the skull base to thigh after the radiotracer. CT data was obtained and used for attenuation correction and anatomic localization.  FASTING BLOOD GLUCOSE: Value: 88 mg/dl  COMPARISON: 11/01/2014.  FINDINGS: NECK  No hypermetabolic lymph nodes in the neck.  CHEST  No hypermetabolic mediastinal or hilar nodes. No suspicious pulmonary nodules on the CT scan.  Right Port-A-Cath tip is at the distal SVC level.  ABDOMEN/PELVIS  No abnormal hypermetabolic activity within the liver, pancreas, adrenal glands, or spleen. No hypermetabolic lymph nodes in the abdomen or pelvis.  Liver contour remains nodular with parenchyma heterogeneous in appearance. As described previously this remains compatible with treated hepatic metastases.  9 mm nodule in the left anterior abdominal wall subcutaneous fat shows mild hypermetabolism. This would be an unusual location for metastatic disease and is felt to be most likely related to infection/inflammation or injection granuloma.  SKELETON  No focal hypermetabolic activity to suggest skeletal metastasis. Bone windows show innumerable sclerotic lesions throughout the skeleton which are stable. Index lesion in the L4 vertebral  body measures 16 mm today, unchanged since previous. The fracture of the left acromion is again noted and FDG uptake in the region of the fracture is presumably related to healing.  IMPRESSION: 1. Stable exam. No definite evidence for hypermetabolic metastases. There is a small nodule in the left anterior abdominal wall subcutaneous fat that shows low level FDG uptake, likely related to small focus of infection/inflammation or injection granuloma. Continued attention to this area on followup is recommended. 2. Healing fracture of the left acromion again noted. Uptake in this region compatible with granulation. 3. Nodular heterogeneous liver compatible with  treated metastases. No focal hypermetabolism today to suggest hypermetabolic liver disease.   Electronically Signed  By: Misty Stanley M.D.  On: 03/29/2015 09:50  ASSESSMENT and THERAPY PLAN:  Stage IV ER positive, HER-2 positive carcinoma of the breast Widespread Brain Metastases End of Life issues Insomnia Anxiety Nausea/vomiting, resolved. Declining EF presumably secondary to Herceptin MUGA 04/01/2015 with EF 60.3%  We will restart herceptin today at a loading dose. We will repeat her MUGA after several cycles of herceptin. She is to continue her current medications including Tamoxifen and XGEVA.  I will tentatively put her down for follow-up in 3 weeks but she knows we will gladly work her in anytime if she has any problems or concerns.  All questions were answered. The patient knows to call the clinic with any problems, questions or concerns. We can certainly see the patient much sooner if necessary.   This note was signed electronically  Larene [place] K. Whitney Muse, MD

## 2015-04-12 ENCOUNTER — Encounter (HOSPITAL_BASED_OUTPATIENT_CLINIC_OR_DEPARTMENT_OTHER): Payer: 59 | Admitting: Hematology & Oncology

## 2015-04-12 ENCOUNTER — Other Ambulatory Visit (HOSPITAL_COMMUNITY): Payer: 59

## 2015-04-12 ENCOUNTER — Encounter (HOSPITAL_COMMUNITY): Payer: 59 | Attending: Hematology & Oncology

## 2015-04-12 ENCOUNTER — Inpatient Hospital Stay (HOSPITAL_COMMUNITY): Payer: 59

## 2015-04-12 ENCOUNTER — Encounter (HOSPITAL_COMMUNITY): Payer: 59

## 2015-04-12 ENCOUNTER — Encounter (HOSPITAL_COMMUNITY): Payer: Self-pay | Admitting: Hematology & Oncology

## 2015-04-12 VITALS — BP 111/69 | HR 95 | Temp 98.5°F | Resp 14

## 2015-04-12 VITALS — BP 123/75 | HR 103 | Temp 98.3°F | Resp 16 | Wt 270.2 lb

## 2015-04-12 DIAGNOSIS — C787 Secondary malignant neoplasm of liver and intrahepatic bile duct: Secondary | ICD-10-CM | POA: Diagnosis not present

## 2015-04-12 DIAGNOSIS — F32A Depression, unspecified: Secondary | ICD-10-CM

## 2015-04-12 DIAGNOSIS — F419 Anxiety disorder, unspecified: Secondary | ICD-10-CM

## 2015-04-12 DIAGNOSIS — C7931 Secondary malignant neoplasm of brain: Secondary | ICD-10-CM

## 2015-04-12 DIAGNOSIS — C7951 Secondary malignant neoplasm of bone: Secondary | ICD-10-CM | POA: Insufficient documentation

## 2015-04-12 DIAGNOSIS — G4700 Insomnia, unspecified: Secondary | ICD-10-CM

## 2015-04-12 DIAGNOSIS — C50919 Malignant neoplasm of unspecified site of unspecified female breast: Secondary | ICD-10-CM

## 2015-04-12 DIAGNOSIS — C229 Malignant neoplasm of liver, not specified as primary or secondary: Secondary | ICD-10-CM | POA: Insufficient documentation

## 2015-04-12 DIAGNOSIS — Z170 Estrogen receptor positive status [ER+]: Secondary | ICD-10-CM

## 2015-04-12 DIAGNOSIS — Z5112 Encounter for antineoplastic immunotherapy: Secondary | ICD-10-CM

## 2015-04-12 DIAGNOSIS — I427 Cardiomyopathy due to drug and external agent: Secondary | ICD-10-CM

## 2015-04-12 DIAGNOSIS — R42 Dizziness and giddiness: Secondary | ICD-10-CM

## 2015-04-12 DIAGNOSIS — F329 Major depressive disorder, single episode, unspecified: Secondary | ICD-10-CM

## 2015-04-12 DIAGNOSIS — I82401 Acute embolism and thrombosis of unspecified deep veins of right lower extremity: Secondary | ICD-10-CM

## 2015-04-12 LAB — CBC WITH DIFFERENTIAL/PLATELET
BASOS ABS: 0 10*3/uL (ref 0.0–0.1)
BASOS PCT: 0 %
EOS ABS: 0 10*3/uL (ref 0.0–0.7)
Eosinophils Relative: 1 %
HEMATOCRIT: 37.2 % (ref 36.0–46.0)
Hemoglobin: 12.4 g/dL (ref 12.0–15.0)
Lymphocytes Relative: 17 %
Lymphs Abs: 0.7 10*3/uL (ref 0.7–4.0)
MCH: 32.5 pg (ref 26.0–34.0)
MCHC: 33.3 g/dL (ref 30.0–36.0)
MCV: 97.6 fL (ref 78.0–100.0)
MONO ABS: 0.5 10*3/uL (ref 0.1–1.0)
MONOS PCT: 11 %
NEUTROS PCT: 71 %
Neutro Abs: 3 10*3/uL (ref 1.7–7.7)
PLATELETS: 205 10*3/uL (ref 150–400)
RBC: 3.81 MIL/uL — AB (ref 3.87–5.11)
RDW: 13.8 % (ref 11.5–15.5)
WBC: 4.3 10*3/uL (ref 4.0–10.5)

## 2015-04-12 LAB — COMPREHENSIVE METABOLIC PANEL
ALBUMIN: 3.4 g/dL — AB (ref 3.5–5.0)
ALT: 34 U/L (ref 14–54)
AST: 37 U/L (ref 15–41)
Alkaline Phosphatase: 30 U/L — ABNORMAL LOW (ref 38–126)
Anion gap: 9 (ref 5–15)
BILIRUBIN TOTAL: 0.4 mg/dL (ref 0.3–1.2)
BUN: 6 mg/dL (ref 6–20)
CHLORIDE: 105 mmol/L (ref 101–111)
CO2: 26 mmol/L (ref 22–32)
CREATININE: 0.73 mg/dL (ref 0.44–1.00)
Calcium: 8.5 mg/dL — ABNORMAL LOW (ref 8.9–10.3)
GFR calc Af Amer: >60 mL/min (ref >60)
GLUCOSE: 134 mg/dL — AB (ref 65–99)
POTASSIUM: 3.4 mmol/L — AB (ref 3.5–5.1)
Sodium: 140 mmol/L (ref 135–145)
Total Protein: 6 g/dL — ABNORMAL LOW (ref 6.5–8.1)

## 2015-04-12 MED ORDER — TRASTUZUMAB CHEMO INJECTION 440 MG
8.0000 mg/kg | Freq: Once | INTRAVENOUS | Status: DC
  Filled 2015-04-12: qty 47

## 2015-04-12 MED ORDER — DENOSUMAB 120 MG/1.7ML ~~LOC~~ SOLN
120.0000 mg | Freq: Once | SUBCUTANEOUS | Status: AC
  Administered 2015-04-12: 120 mg via SUBCUTANEOUS
  Filled 2015-04-12: qty 1.7

## 2015-04-12 MED ORDER — ACETAMINOPHEN 325 MG PO TABS
650.0000 mg | ORAL_TABLET | Freq: Once | ORAL | Status: AC
  Administered 2015-04-12: 650 mg via ORAL

## 2015-04-12 MED ORDER — HEPARIN SOD (PORK) LOCK FLUSH 100 UNIT/ML IV SOLN
500.0000 [IU] | Freq: Once | INTRAVENOUS | Status: AC | PRN
  Administered 2015-04-12: 500 [IU]

## 2015-04-12 MED ORDER — DIPHENHYDRAMINE HCL 25 MG PO CAPS
50.0000 mg | ORAL_CAPSULE | Freq: Once | ORAL | Status: AC
  Administered 2015-04-12: 50 mg via ORAL

## 2015-04-12 MED ORDER — HEPARIN SOD (PORK) LOCK FLUSH 100 UNIT/ML IV SOLN
INTRAVENOUS | Status: AC
  Filled 2015-04-12: qty 5

## 2015-04-12 MED ORDER — ACETAMINOPHEN 500 MG PO TABS
ORAL_TABLET | ORAL | Status: AC
  Filled 2015-04-12: qty 2

## 2015-04-12 MED ORDER — SODIUM CHLORIDE 0.9 % IV SOLN
Freq: Once | INTRAVENOUS | Status: AC
  Administered 2015-04-12: 11:00:00 via INTRAVENOUS

## 2015-04-12 MED ORDER — TRASTUZUMAB CHEMO INJECTION 440 MG
8.0000 mg/kg | Freq: Once | INTRAVENOUS | Status: AC
  Administered 2015-04-12: 987 mg via INTRAVENOUS
  Filled 2015-04-12: qty 47

## 2015-04-12 MED ORDER — TRASTUZUMAB CHEMO INJECTION 440 MG
6.0000 mg/kg | Freq: Once | INTRAVENOUS | Status: DC
  Filled 2015-04-12: qty 32

## 2015-04-12 MED ORDER — DIPHENHYDRAMINE HCL 25 MG PO CAPS
ORAL_CAPSULE | ORAL | Status: AC
  Filled 2015-04-12: qty 2

## 2015-04-12 MED ORDER — SODIUM CHLORIDE 0.9 % IJ SOLN
10.0000 mL | INTRAMUSCULAR | Status: DC | PRN
  Administered 2015-04-12: 10 mL
  Filled 2015-04-12: qty 10

## 2015-04-12 NOTE — Patient Instructions (Signed)
[facility] Discharge Instructions for Patients Receiving Chemotherapy  Today you received the following chemotherapy agent: Herceptin.   You were also given Xgeva today.     If you develop nausea and vomiting, or diarrhea that is not controlled by your medication, call the clinic.  The clinic phone number is [phone]. Office hours are Monday-Friday 8:30am-5:00pm.  BELOW ARE SYMPTOMS THAT SHOULD BE REPORTED IMMEDIATELY:  *FEVER GREATER THAN 101.0 F  *CHILLS WITH OR WITHOUT FEVER  NAUSEA AND VOMITING THAT IS NOT CONTROLLED WITH YOUR NAUSEA MEDICATION  *UNUSUAL SHORTNESS OF BREATH  *UNUSUAL BRUISING OR BLEEDING  TENDERNESS IN MOUTH AND THROAT WITH OR WITHOUT PRESENCE OF ULCERS  *URINARY PROBLEMS  *BOWEL PROBLEMS  UNUSUAL RASH Items with * indicate a potential emergency and should be followed up as soon as possible. If you have an emergency after office hours please contact your primary care physician or go to the nearest emergency department.  Please call the clinic during office hours if you have any questions or concerns.   You may also contact the Patient Navigator at [phone] should you have any questions or need assistance in obtaining follow up care.

## 2015-04-12 NOTE — Progress Notes (Signed)
Patient was educated on the need to take her potassium as ordered.  She has them at home without the need for a refill.  Education given to the patient about the effects that Lasix has on the renal filtering and wasting of potassium and that it is imperative that she take the potassium for heart and muscle function.  Patient also reports that she is taking her calcium as ordered and was encouraged to continue to take it as she has been.  Robynn Pane, PA was given the lab results from today's CMET and the corrected calcium was verified as 9.1, so the patient is fine for her injection.   Patient tolerated infusion well.  VSS post infusion.   [name] presents today for injection per MD orders. Xgeva administered SQ in right Abdomen. Administration without incident. Patient tolerated well.

## 2015-04-12 NOTE — Patient Instructions (Addendum)
[place] at [facility] Discharge Instructions  RECOMMENDATIONS MADE BY THE CONSULTANT AND ANY TEST RESULTS WILL BE SENT TO YOUR REFERRING PHYSICIAN.  Exam completed by Dr Whitney Muse today You can increase your ritalin to 2 tablets in the am and 1 tablet at 1pm. The stimulants may make you want to make you want to eat less. Herceptin today Re-echoed after next treatment of herceptin Herceptin every 3 weeks. X-geva every 4 weeks Return to see the doctor in 3 weeks  Please call the clinic if you have any questions or concerns  Thank you for choosing [place] at [facility] to provide your oncology and hematology care.  To afford each patient quality time with our provider, please arrive at least 15 minutes before your scheduled appointment time.    You need to re-schedule your appointment should you arrive 10 or more minutes late.  We strive to give you quality time with our providers, and arriving late affects you and other patients whose appointments are after yours.  Also, if you no show three or more times for appointments you may be dismissed from the clinic at the providers discretion.     Again, thank you for choosing [facility].  Our hope is that these requests will decrease the amount of time that you wait before being seen by our physicians.       _____________________________________________________________  Should you have questions after your visit to [facility], please contact our office at [phone] between the hours of 8:30 a.m. and 4:30 p.m.  Voicemails left after 4:30 p.m. will not be returned until the following business day.  For prescription refill requests, have your pharmacy contact our office.

## 2015-04-12 NOTE — Progress Notes (Signed)
Please see other encounter for documentation.   

## 2015-04-13 LAB — CANCER ANTIGEN 27.29: CA 27.29: 27 U/mL (ref 0.0–38.6)

## 2015-04-15 ENCOUNTER — Other Ambulatory Visit (HOSPITAL_COMMUNITY): Payer: Self-pay | Admitting: Hematology & Oncology

## 2015-04-15 DIAGNOSIS — C50912 Malignant neoplasm of unspecified site of left female breast: Secondary | ICD-10-CM

## 2015-04-17 ENCOUNTER — Ambulatory Visit (HOSPITAL_COMMUNITY): Payer: 59 | Attending: Hematology & Oncology | Admitting: Occupational Therapy

## 2015-04-17 ENCOUNTER — Encounter (HOSPITAL_BASED_OUTPATIENT_CLINIC_OR_DEPARTMENT_OTHER): Payer: 59 | Admitting: Oncology

## 2015-04-17 ENCOUNTER — Encounter (HOSPITAL_COMMUNITY): Payer: Self-pay | Admitting: Occupational Therapy

## 2015-04-17 ENCOUNTER — Telehealth (HOSPITAL_COMMUNITY): Payer: Self-pay

## 2015-04-17 ENCOUNTER — Other Ambulatory Visit (HOSPITAL_COMMUNITY): Payer: Self-pay | Admitting: Oncology

## 2015-04-17 ENCOUNTER — Encounter (HOSPITAL_COMMUNITY): Payer: 59

## 2015-04-17 ENCOUNTER — Other Ambulatory Visit: Payer: Self-pay

## 2015-04-17 ENCOUNTER — Encounter (HOSPITAL_COMMUNITY): Payer: Self-pay

## 2015-04-17 DIAGNOSIS — R609 Edema, unspecified: Secondary | ICD-10-CM

## 2015-04-17 DIAGNOSIS — R5383 Other fatigue: Secondary | ICD-10-CM

## 2015-04-17 DIAGNOSIS — C50919 Malignant neoplasm of unspecified site of unspecified female breast: Secondary | ICD-10-CM | POA: Diagnosis not present

## 2015-04-17 DIAGNOSIS — R531 Weakness: Secondary | ICD-10-CM | POA: Diagnosis present

## 2015-04-17 DIAGNOSIS — C7951 Secondary malignant neoplasm of bone: Secondary | ICD-10-CM | POA: Diagnosis not present

## 2015-04-17 DIAGNOSIS — C229 Malignant neoplasm of liver, not specified as primary or secondary: Secondary | ICD-10-CM | POA: Diagnosis not present

## 2015-04-17 DIAGNOSIS — R000 Tachycardia, unspecified: Secondary | ICD-10-CM

## 2015-04-17 DIAGNOSIS — R600 Localized edema: Secondary | ICD-10-CM

## 2015-04-17 LAB — TSH: TSH: 2.519 u[IU]/mL (ref 0.350–4.500)

## 2015-04-17 LAB — CBC WITH DIFFERENTIAL/PLATELET
BASOS ABS: 0 10*3/uL (ref 0.0–0.1)
BASOS PCT: 0 %
EOS ABS: 0 10*3/uL (ref 0.0–0.7)
EOS PCT: 1 %
HEMATOCRIT: 36.4 % (ref 36.0–46.0)
Hemoglobin: 12 g/dL (ref 12.0–15.0)
Lymphocytes Relative: 19 %
Lymphs Abs: 0.8 10*3/uL (ref 0.7–4.0)
MCH: 32 pg (ref 26.0–34.0)
MCHC: 33 g/dL (ref 30.0–36.0)
MCV: 97.1 fL (ref 78.0–100.0)
MONO ABS: 0.4 10*3/uL (ref 0.1–1.0)
MONOS PCT: 9 %
NEUTROS ABS: 3 10*3/uL (ref 1.7–7.7)
Neutrophils Relative %: 71 %
PLATELETS: 194 10*3/uL (ref 150–400)
RBC: 3.75 MIL/uL — ABNORMAL LOW (ref 3.87–5.11)
RDW: 13.7 % (ref 11.5–15.5)
WBC: 4.3 10*3/uL (ref 4.0–10.5)

## 2015-04-17 LAB — COMPREHENSIVE METABOLIC PANEL
ALBUMIN: 3.5 g/dL (ref 3.5–5.0)
ALT: 29 U/L (ref 14–54)
ANION GAP: 10 (ref 5–15)
AST: 29 U/L (ref 15–41)
Alkaline Phosphatase: 29 U/L — ABNORMAL LOW (ref 38–126)
BILIRUBIN TOTAL: 0.4 mg/dL (ref 0.3–1.2)
BUN: 7 mg/dL (ref 6–20)
CHLORIDE: 104 mmol/L (ref 101–111)
CO2: 25 mmol/L (ref 22–32)
Calcium: 9 mg/dL (ref 8.9–10.3)
Creatinine, Ser: 0.81 mg/dL (ref 0.44–1.00)
GFR calc Af Amer: >60 mL/min (ref >60)
GFR calc non Af Amer: >60 mL/min (ref >60)
GLUCOSE: 140 mg/dL — AB (ref 65–99)
POTASSIUM: 3.4 mmol/L — AB (ref 3.5–5.1)
SODIUM: 139 mmol/L (ref 135–145)
TOTAL PROTEIN: 5.9 g/dL — AB (ref 6.5–8.1)

## 2015-04-17 NOTE — Progress Notes (Signed)
[name] presented for labwork. Labs per MD order drawn via Peripheral Line 23 gauge needle inserted in right AC  Good blood return present. Procedure without incident.  Needle removed intact. Patient tolerated procedure well.

## 2015-04-17 NOTE — Telephone Encounter (Signed)
Per Robynn Pane, PA-C, based upon recent labs patient was instructed to increase her lasix to 2 tablets daily for 4 - 5 days , take her potassium daily and call us on Monday or Tuesday with update on condition.

## 2015-04-17 NOTE — Patient Instructions (Addendum)
[place] at [facility] Discharge Instructions  RECOMMENDATIONS MADE BY THE CONSULTANT AND ANY TEST RESULTS WILL BE SENT TO YOUR REFERRING PHYSICIAN.  Exam and discussion by Robynn Pane, PA-C Will check some labs and EKG today. Will call you with results and of any action that needs to be taken.  Follow-up as scheduled.  Thank you for choosing [place] at [facility] to provide your oncology and hematology care.  To afford each patient quality time with our provider, please arrive at least 15 minutes before your scheduled appointment time.    You need to re-schedule your appointment should you arrive 10 or more minutes late.  We strive to give you quality time with our providers, and arriving late affects you and other patients whose appointments are after yours.  Also, if you no show three or more times for appointments you may be dismissed from the clinic at the providers discretion.     Again, thank you for choosing [facility].  Our hope is that these requests will decrease the amount of time that you wait before being seen by our physicians.       _____________________________________________________________  Should you have questions after your visit to [facility], please contact our office at [phone] between the hours of 8:30 a.m. and 4:30 p.m.  Voicemails left after 4:30 p.m. will not be returned until the following business day.  For prescription refill requests, have your pharmacy contact our office.

## 2015-04-17 NOTE — Progress Notes (Signed)
Patient is seen as a work-in after calling the clinic reporting edema.  [name] restarted Herceptin on 04/12/2015.  That was a Friday.  She notes that on Saturday, she started to developed LE edema.  She denies any new medication other than Herceptin and Ritalin (prescribed by Dr. Whitney Muse due to fatigue).  This should not be from Ritalin.  She reports that it is a little better this afternoon, but this AM, she noted periorbital edema in addition to UE and LE edema.  Her wedding band has been removed.    She denies any chest pain or SOB.  She admits to persistent fatigue.  Her appetite is down and she reviewed her meals with me over the past 24-48 hours and I do not see any major insults from a dietary standpoint.  There were no vitals taken for this visit. Gen: A and O x 3, smiling, fatigued, unaccompanied HEENT: Atraumatic, normocephalic Neck: Trachea is midline.  Neck is supple. Lungs: CTA B/L Cardiac: RRR without murmur rub or gallop.  Physiologic S2 splitting noted.  Apical HR of 100. Skin: warm and dry Extremities: B/L LE edema, worse in ankles and feet bilaterally, 2+ pitting without edema above the knee B/L.  Mild edema in hands B/L without pitting and fullness in the posterior writs region. Neuro: A and O x 3 without any focal deficits.   Assessment: 1. Edema, etiology unclear.   2. Fatigue, multifactorial 3. Stage IV Breast cancer, Her2+  Plan: 1. Labs today: CBC diff, CMET, TSH. 2. If renal function is stable, will increase Lasix for a short time (3-5 days) to mobilize fluid. 3. EKG today- EKG is very stable without any acute changes when compared to her May and July 2016 EKGs. 4. Will discuss further with Dr. Whitney Muse and evaluate the utility of a short-interim heart function test.  Patient and plan discussed with Dr. Ancil Linsey and she is in agreement with the aforementioned.   KEFALAS,THOMAS, PA-C 04/17/2015 3:58 PM  Addendum: Renal function is stable and robust.   Therefore, the patient is advised to take 40 mg PO of lasix for 4-5 day.  She is to continue with her K+ replacement, particularly in light of this lasix dose increase.  EKG was very stable with no acute changes.  She will update [place] on Monday/Tuesday.  Robynn Pane, PA-C 04/17/2015 5:26 PM

## 2015-04-17 NOTE — Therapy (Signed)
[place], [place], [address] Phone: [phone]   Fax:  [phone]  Occupational Therapy Evaluation  Patient Details  Name: [name] MRN: [id number] Date of Birth: 05-21-1965 Referring Provider: Ancil Linsey  Encounter Date: 04/17/2015      OT End of Session - 04/17/15 1508    Visit Number 1   Number of Visits 1   Date for OT Re-Evaluation 06/16/15   Authorization Type [employer]   OT Start Time 1350   OT Stop Time 1438   OT Time Calculation (min) 48 min   Activity Tolerance Patient tolerated treatment well   Behavior During Therapy [facility] for tasks assessed/performed      Past Medical History  Diagnosis Date  . PONV (postoperative nausea and vomiting)     pt states scope patch does well  . Anemia   . Depression   . Anxiety   . Headache(784.0)     has migraines - medication controls  . Family history of prostate cancer   . Hot flashes due to tamoxifen 09/25/2014  . Breast cancer ([place]) 04/2014    Presumed dx; stage 4 w/ mets to bone and liver, brain lesions  . Bony metastasis ([place])   . S/P small bowel resection   . Drug-induced cardiomyopathy ([place]) 02/15/2015    Past Surgical History  Procedure Laterality Date  . Right rotator cuff      2002  . Neck fusion      2003  . Laparoscopic cholecystectomy      2004  . Right knee arthroscopy      2005  . Appendectomy      2008  . Abdominal hysterectomy    . Breast reduction surgery  03/17/2011    Procedure: MAMMARY REDUCTION BILATERAL (BREAST);  Surgeon: Mary A Contogiannis;  Location: [facility];  Service: Plastics;  Laterality: Bilateral;  . Colonoscopy N/A 07/13/2013    Procedure: COLONOSCOPY;  Surgeon: Rogene Houston, MD;  [facility];  Service: Endoscopy;  Laterality: N/A;  930  . Liver biopsy  04/2014  . Portacath placement    . Esophagogastroduodenoscopy N/A 05/25/2014    Procedure: ESOPHAGOGASTRODUODENOSCOPY (EGD);   Surgeon: Rogene Houston, MD;  [facility];  Service: Endoscopy;  Laterality: N/A;  155  . Laparoscopic appendectomy    . Colonoscopy N/A 11/26/2014    Procedure: COLONOSCOPY;  Surgeon: Rogene Houston, MD;  [facility];  Service: Endoscopy;  Laterality: N/A;  730    There were no vitals filed for this visit.  Visit Diagnosis:  Weakness      Subjective Assessment - 04/17/15 1505    Subjective  S: I'm just tired all the time.    Pertinent History Pt is a 49 y/o female with a diagnosis of Stage IV Breast Cancer, diagnosed in December 2015. Pt presents for cancer rehabilitation evaluation this date due to fatigue and weakness. Dr. Ancil Linsey referred pt to occupational therapy for evaluation and treatment.    Special Tests Facit F Total score: 77/160; VAS Fatigue 9; VAS Pain 4.3; VAS Distress 2.3   Patient Stated Goals To have more energy.    Currently in Pain? Yes   Pain Score 2    Pain Location Shoulder   Pain Orientation Left   Pain Descriptors / Indicators Aching   Pain Type Acute pain           [facility] OT Assessment - 04/17/15 1352  Assessment   Diagnosis Right Breast Cancer   Referring Provider Ancil Linsey   Onset Date 04/10/14   Prior Therapy None   Precautions   Precautions None   Balance Screen   Has the patient fallen in the past 6 months Yes   How many times? 2   Has the patient had a decrease in activity level because of a fear of falling?  No   Is the patient reluctant to leave their home because of a fear of falling?  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    IADL   Prior Level of Function Shopping Independent.    Shopping Takes care of all shopping needs independently   Prior Level of Function Light Housekeeping independent   Light Housekeeping Performs light daily tasks such as dishwashing, bed making   Prior Level of Function Meal Prep independent   Meal Prep Able to complete simple warm meal prep   Prior Level of Function IT trainer own vehicle   Medication Management Is responsible for taking medication in correct dosages at correct time   Written Expression   Dominant Hand Right   Vision - History   Baseline Vision Wears glasses only for reading   Activity Tolerance   Activity Tolerance Tolerates 10-20 min activity with muiltiple rests  per pt report   Cognition   Overall Cognitive Status Within Functional Limits for tasks assessed   Observation/Other Assessments   Other Surveys  Select   Outcome Measures Facit F Total Score: 77/160; VAS Fatigue severity: 9; VAS Pain severity: 4.3; VAS Distress severity: 2.3   ROM / Strength   AROM / PROM / Strength Strength;AROM   AROM   Overall AROM Comments BUE A/ROM WNL   Strength   Overall Strength Comments BUE strength WNL (4+/5)                         OT Education - 04/17/15 1507    Education provided Yes   Education Details Cancer book provided Corporate treasurer); educated pt on energy conservation strategies, setting goals for day/week/month   Person(s) Educated Patient   Methods Explanation;Demonstration;Handout   Comprehension Verbalized understanding;Returned demonstration           OT Short Term Goals - 04/17/15 1513    OT SHORT TERM GOAL #1   Title Pt will be educated on energy conservation strategies to incorporate into daily tasks to work on lessening fatigue throughout the day.    Time 1   Period Days   Status New                  Plan - 04/17/15 1508    Clinical Impression  Statement A: Pt is a 49 y/o female with a diagnosis of Stage IV breast cancer, presenting for OT evaluation. Pt reports independence in all ADL tasks, husband and family assist with household tasks. Pt does have a fracture in the left shoulder region, present since mid-summer that has not healed. Minimal pain experienced from this. Educated pt on energy conservation strategies to assist in lessening the amount of fatigue she experiences. Pt is already incorporating these strategies into her daily tasks. Educated pt on Cancer Rehab booklet and reviewed goal setting for daily/weekly/monthly tasks. Pt feels that she will use booklet and charts for setting goals. Pt is agreeable that she will not require OT services and is able to implement energy conservation strategies at home.    Pt will benefit from skilled therapeutic intervention in order to improve on the following deficits (Retired) Decreased endurance   Rehab Potential Good   OT Frequency 1x / week   OT Duration --  1 visit   OT Treatment/Interventions Patient/family education   Plan P: Educated pt on energy conservation strategies; cancer rehab booklet.    Consulted and Agree with Plan of Care Patient        Problem List Patient Active Problem List   Diagnosis Date Noted  . Drug-induced cardiomyopathy ([place]) 02/15/2015  . Brain metastases ([place]) 01/29/2015  . Hot flashes due to tamoxifen 09/25/2014  . Genetic testing 09/05/2014  . Family history of prostate cancer   . GERD (gastroesophageal reflux disease) 07/16/2014  . DVT (deep venous thrombosis) ([place]) 07/09/2014  . Bone metastases ([place]) 05/23/2014  . Breast  cancer, stage 4 ([place]) 05/16/2014  . Hyperlipidemia 04/23/2014  . Depression 04/23/2014    Guadelupe Sabin, OTR/L  [phone]  04/17/2015, 3:16 PM  [place] [address] [place], [place], [address] Phone: [phone]   Fax:  [phone]  Name: [name] MRN: [id number] Date of Birth: 17-Mar-1966

## 2015-04-17 NOTE — Progress Notes (Unsigned)
In for labs, complains with extreme fatigue, exertional dyspnea and generalized edema.

## 2015-04-18 ENCOUNTER — Other Ambulatory Visit (HOSPITAL_COMMUNITY): Payer: 59

## 2015-04-18 ENCOUNTER — Other Ambulatory Visit (HOSPITAL_COMMUNITY): Payer: Self-pay | Admitting: Oncology

## 2015-04-18 DIAGNOSIS — R600 Localized edema: Secondary | ICD-10-CM

## 2015-04-18 MED ORDER — FUROSEMIDE 20 MG PO TABS: ORAL_TABLET | ORAL | Status: DC

## 2015-04-23 ENCOUNTER — Ambulatory Visit (HOSPITAL_COMMUNITY): Payer: 59 | Admitting: Physical Therapy

## 2015-04-29 ENCOUNTER — Telehealth (HOSPITAL_COMMUNITY): Payer: Self-pay | Admitting: *Deleted

## 2015-04-30 ENCOUNTER — Telehealth (HOSPITAL_COMMUNITY): Payer: Self-pay | Admitting: *Deleted

## 2015-04-30 ENCOUNTER — Other Ambulatory Visit (HOSPITAL_COMMUNITY): Payer: Self-pay | Admitting: *Deleted

## 2015-04-30 DIAGNOSIS — C7951 Secondary malignant neoplasm of bone: Secondary | ICD-10-CM

## 2015-04-30 MED ORDER — METHYLPREDNISOLONE 4 MG PO TBPK: ORAL_TABLET | ORAL | Status: DC

## 2015-04-30 NOTE — Telephone Encounter (Signed)
No new meds, no redness or swelling. Tenderness/sore over all joints especially  lower back, also notes it in her fingers and back of her head and forehead. Agreeable to medrol dose pack and i will call that in.

## 2015-04-30 NOTE — Telephone Encounter (Signed)
Message left for patient to call us. We need to question if she has started any new meds? Does she have any redness or swelling. Relay that this could be viral, not really sure. Offer her a medrol dose pack.  These instructions are per T. Kefalas PAC

## 2015-05-03 ENCOUNTER — Encounter (HOSPITAL_COMMUNITY): Payer: Self-pay

## 2015-05-03 ENCOUNTER — Other Ambulatory Visit (HOSPITAL_COMMUNITY): Payer: Self-pay | Admitting: Oncology

## 2015-05-03 ENCOUNTER — Encounter (HOSPITAL_BASED_OUTPATIENT_CLINIC_OR_DEPARTMENT_OTHER): Payer: 59

## 2015-05-03 ENCOUNTER — Other Ambulatory Visit (HOSPITAL_COMMUNITY): Payer: Self-pay | Admitting: Emergency Medicine

## 2015-05-03 VITALS — BP 144/87 | HR 78 | Temp 97.8°F | Resp 20 | Wt 261.8 lb

## 2015-05-03 DIAGNOSIS — C229 Malignant neoplasm of liver, not specified as primary or secondary: Secondary | ICD-10-CM | POA: Diagnosis not present

## 2015-05-03 DIAGNOSIS — T753XXA Motion sickness, initial encounter: Secondary | ICD-10-CM

## 2015-05-03 DIAGNOSIS — R5382 Chronic fatigue, unspecified: Secondary | ICD-10-CM

## 2015-05-03 DIAGNOSIS — C50919 Malignant neoplasm of unspecified site of unspecified female breast: Secondary | ICD-10-CM

## 2015-05-03 DIAGNOSIS — Z5112 Encounter for antineoplastic immunotherapy: Secondary | ICD-10-CM

## 2015-05-03 DIAGNOSIS — C7951 Secondary malignant neoplasm of bone: Secondary | ICD-10-CM

## 2015-05-03 DIAGNOSIS — E876 Hypokalemia: Secondary | ICD-10-CM

## 2015-05-03 LAB — CBC WITH DIFFERENTIAL/PLATELET
BASOS ABS: 0 10*3/uL (ref 0.0–0.1)
BASOS PCT: 0 %
EOS ABS: 0.1 10*3/uL (ref 0.0–0.7)
Eosinophils Relative: 1 %
HEMATOCRIT: 36.7 % (ref 36.0–46.0)
Hemoglobin: 12 g/dL (ref 12.0–15.0)
Lymphocytes Relative: 17 %
Lymphs Abs: 0.9 10*3/uL (ref 0.7–4.0)
MCH: 31.7 pg (ref 26.0–34.0)
MCHC: 32.7 g/dL (ref 30.0–36.0)
MCV: 97.1 fL (ref 78.0–100.0)
MONO ABS: 0.5 10*3/uL (ref 0.1–1.0)
MONOS PCT: 11 %
NEUTROS ABS: 3.7 10*3/uL (ref 1.7–7.7)
Neutrophils Relative %: 71 %
PLATELETS: 201 10*3/uL (ref 150–400)
RBC: 3.78 MIL/uL — ABNORMAL LOW (ref 3.87–5.11)
RDW: 13.2 % (ref 11.5–15.5)
WBC: 5.1 10*3/uL (ref 4.0–10.5)

## 2015-05-03 LAB — COMPREHENSIVE METABOLIC PANEL
ALBUMIN: 3.7 g/dL (ref 3.5–5.0)
ALT: 24 U/L (ref 14–54)
ANION GAP: 9 (ref 5–15)
AST: 29 U/L (ref 15–41)
Alkaline Phosphatase: 26 U/L — ABNORMAL LOW (ref 38–126)
BILIRUBIN TOTAL: 0.5 mg/dL (ref 0.3–1.2)
BUN: 9 mg/dL (ref 6–20)
CHLORIDE: 106 mmol/L (ref 101–111)
CO2: 25 mmol/L (ref 22–32)
Calcium: 8.9 mg/dL (ref 8.9–10.3)
Creatinine, Ser: 0.82 mg/dL (ref 0.44–1.00)
GFR calc Af Amer: >60 mL/min (ref >60)
GLUCOSE: 129 mg/dL — AB (ref 65–99)
POTASSIUM: 3.3 mmol/L — AB (ref 3.5–5.1)
Sodium: 140 mmol/L (ref 135–145)
TOTAL PROTEIN: 6.1 g/dL — AB (ref 6.5–8.1)

## 2015-05-03 MED ORDER — DIPHENHYDRAMINE HCL 25 MG PO CAPS
ORAL_CAPSULE | ORAL | Status: AC
  Filled 2015-05-03: qty 2

## 2015-05-03 MED ORDER — POTASSIUM CHLORIDE CRYS ER 20 MEQ PO TBCR: 20.0000 meq | EXTENDED_RELEASE_TABLET | Freq: Two times a day (BID) | ORAL | Status: DC

## 2015-05-03 MED ORDER — SCOPOLAMINE 1 MG/3DAYS TD PT72: 1.0000 | MEDICATED_PATCH | TRANSDERMAL | Status: DC

## 2015-05-03 MED ORDER — SODIUM CHLORIDE 0.9 % IJ SOLN: 10.0000 mL | INTRAMUSCULAR | Status: DC | PRN

## 2015-05-03 MED ORDER — ACETAMINOPHEN 325 MG PO TABS
650.0000 mg | ORAL_TABLET | Freq: Once | ORAL | Status: AC
  Administered 2015-05-03: 650 mg via ORAL

## 2015-05-03 MED ORDER — ACETAMINOPHEN 325 MG PO TABS
ORAL_TABLET | ORAL | Status: AC
  Filled 2015-05-03: qty 2

## 2015-05-03 MED ORDER — DIPHENHYDRAMINE HCL 25 MG PO CAPS
50.0000 mg | ORAL_CAPSULE | Freq: Once | ORAL | Status: AC
  Administered 2015-05-03: 50 mg via ORAL

## 2015-05-03 MED ORDER — SODIUM CHLORIDE 0.9 % IV SOLN
Freq: Once | INTRAVENOUS | Status: AC
  Administered 2015-05-03: 12:00:00 via INTRAVENOUS

## 2015-05-03 MED ORDER — MORPHINE SULFATE ER 30 MG PO TBCR: 30.0000 mg | EXTENDED_RELEASE_TABLET | Freq: Two times a day (BID) | ORAL | Status: DC

## 2015-05-03 MED ORDER — HEPARIN SOD (PORK) LOCK FLUSH 100 UNIT/ML IV SOLN
500.0000 [IU] | Freq: Once | INTRAVENOUS | Status: AC | PRN
  Administered 2015-05-03: 500 [IU]
  Filled 2015-05-03: qty 5

## 2015-05-03 MED ORDER — TRASTUZUMAB CHEMO INJECTION 440 MG
6.0000 mg/kg | Freq: Once | INTRAVENOUS | Status: AC
  Administered 2015-05-03: 672 mg via INTRAVENOUS
  Filled 2015-05-03: qty 32

## 2015-05-03 MED ORDER — METHYLPHENIDATE HCL 10 MG PO TABS: 10.0000 mg | ORAL_TABLET | Freq: Two times a day (BID) | ORAL | Status: DC

## 2015-05-03 NOTE — Patient Instructions (Signed)
..  [facility] Discharge Instructions for Patients Receiving Chemotherapy  Today you received the following chemotherapy agents herceptin  Heart test next week as scheduled  If you develop nausea and vomiting, or diarrhea that is not controlled by your medication, call the clinic.  The clinic phone number is [phone]. Office hours are Monday-Friday 8:30am-5:00pm.  BELOW ARE SYMPTOMS THAT SHOULD BE REPORTED IMMEDIATELY:  *FEVER GREATER THAN 101.0 F  *CHILLS WITH OR WITHOUT FEVER  NAUSEA AND VOMITING THAT IS NOT CONTROLLED WITH YOUR NAUSEA MEDICATION  *UNUSUAL SHORTNESS OF BREATH  *UNUSUAL BRUISING OR BLEEDING  TENDERNESS IN MOUTH AND THROAT WITH OR WITHOUT PRESENCE OF ULCERS  *URINARY PROBLEMS  *BOWEL PROBLEMS  UNUSUAL RASH Items with * indicate a potential emergency and should be followed up as soon as possible. If you have an emergency after office hours please contact your primary care physician or go to the nearest emergency department.  Please call the clinic during office hours if you have any questions or concerns.   You may also contact the Patient Navigator at [phone] should you have any questions or need assistance in obtaining follow up care.

## 2015-05-04 LAB — CANCER ANTIGEN 27.29: CA 27.29: 22.8 U/mL (ref 0.0–38.6)

## 2015-05-07 ENCOUNTER — Ambulatory Visit (HOSPITAL_COMMUNITY): Payer: 59

## 2015-05-07 ENCOUNTER — Ambulatory Visit (HOSPITAL_COMMUNITY): Payer: 59 | Admitting: Hematology & Oncology

## 2015-05-08 ENCOUNTER — Ambulatory Visit (HOSPITAL_COMMUNITY)
Admission: RE | Admit: 2015-05-08 | Discharge: 2015-05-08 | Disposition: A | Discharge status: Home / Self Care | Payer: 59 | Source: Ambulatory Visit | Attending: Hematology & Oncology | Admitting: Hematology & Oncology

## 2015-05-08 ENCOUNTER — Encounter (HOSPITAL_COMMUNITY): Payer: Self-pay

## 2015-05-08 DIAGNOSIS — Z79899 Other long term (current) drug therapy: Secondary | ICD-10-CM | POA: Diagnosis not present

## 2015-05-08 DIAGNOSIS — C50919 Malignant neoplasm of unspecified site of unspecified female breast: Secondary | ICD-10-CM | POA: Insufficient documentation

## 2015-05-08 DIAGNOSIS — C50912 Malignant neoplasm of unspecified site of left female breast: Secondary | ICD-10-CM

## 2015-05-08 MED ORDER — TECHNETIUM TC 99M-LABELED RED BLOOD CELLS IV KIT
25.0000 | PACK | Freq: Once | INTRAVENOUS | Status: AC | PRN
  Administered 2015-05-08: 23.6 via INTRAVENOUS

## 2015-05-08 MED ORDER — HEPARIN SOD (PORK) LOCK FLUSH 100 UNIT/ML IV SOLN
INTRAVENOUS | Status: AC
  Filled 2015-05-08: qty 5

## 2015-05-09 ENCOUNTER — Ambulatory Visit (HOSPITAL_COMMUNITY): Payer: 59 | Admitting: Hematology & Oncology

## 2015-05-10 ENCOUNTER — Encounter (HOSPITAL_COMMUNITY): Payer: 59

## 2015-05-10 ENCOUNTER — Encounter (HOSPITAL_COMMUNITY): Payer: Self-pay | Admitting: Hematology & Oncology

## 2015-05-10 ENCOUNTER — Encounter (HOSPITAL_BASED_OUTPATIENT_CLINIC_OR_DEPARTMENT_OTHER): Payer: 59 | Admitting: Hematology & Oncology

## 2015-05-10 VITALS — BP 114/72 | HR 96 | Temp 97.9°F | Resp 18 | Wt 267.0 lb

## 2015-05-10 DIAGNOSIS — C7931 Secondary malignant neoplasm of brain: Secondary | ICD-10-CM

## 2015-05-10 DIAGNOSIS — C787 Secondary malignant neoplasm of liver and intrahepatic bile duct: Secondary | ICD-10-CM | POA: Diagnosis not present

## 2015-05-10 DIAGNOSIS — C50919 Malignant neoplasm of unspecified site of unspecified female breast: Secondary | ICD-10-CM

## 2015-05-10 DIAGNOSIS — C7951 Secondary malignant neoplasm of bone: Secondary | ICD-10-CM

## 2015-05-10 DIAGNOSIS — M2550 Pain in unspecified joint: Secondary | ICD-10-CM

## 2015-05-10 DIAGNOSIS — M25519 Pain in unspecified shoulder: Secondary | ICD-10-CM

## 2015-05-10 DIAGNOSIS — R5382 Chronic fatigue, unspecified: Secondary | ICD-10-CM

## 2015-05-10 MED ORDER — METHYLPHENIDATE HCL 10 MG PO TABS: ORAL_TABLET | ORAL | Status: DC

## 2015-05-10 MED ORDER — DENOSUMAB 120 MG/1.7ML ~~LOC~~ SOLN
120.0000 mg | Freq: Once | SUBCUTANEOUS | Status: AC
  Administered 2015-05-10: 120 mg via SUBCUTANEOUS
  Filled 2015-05-10: qty 1.7

## 2015-05-10 MED ORDER — CIPROFLOXACIN HCL 500 MG PO TABS: 500.0000 mg | ORAL_TABLET | Freq: Two times a day (BID) | ORAL | Status: DC

## 2015-05-10 NOTE — Patient Instructions (Signed)
..  [facility] Discharge Instructions    xgeva given today   Thank you for choosing [place] at [facility] to provide your oncology and hematology care.  To afford each patient quality time with our provider, please arrive at least 15 minutes before your scheduled appointment time.    You need to re-schedule your appointment should you arrive 10 or more minutes late.  We strive to give you quality time with our providers, and arriving late affects you and other patients whose appointments are after yours.  Also, if you no show three or more times for appointments you may be dismissed from the clinic at the providers discretion.     Again, thank you for choosing [facility].  Our hope is that these requests will decrease the amount of time that you wait before being seen by our physicians.       _____________________________________________________________  Should you have questions after your visit to [facility], please contact our office at [phone] between the hours of 8:30 a.m. and 4:30 p.m.  Voicemails left after 4:30 p.m. will not be returned until the following business day.  For prescription refill requests, have your pharmacy contact our office.

## 2015-05-10 NOTE — Progress Notes (Signed)
Allison Labrum, MD [place] [address]    Breast cancer, stage 4 ([place])   04/25/2014 Initial Diagnosis Breast cancer, stage 4   04/25/2014 Imaging CT abdomen/pelvia with widespread metastatic disease to the liver, multiple lytic lesions throughout spine and pelvis. No FX or epidural tumor identified   04/26/2014 Imaging CT head unremarkable   04/26/2014 Imaging CT chest with no lung mass or pulmonary nodules, no adenopathy. Lytic bone lesions, right 2nd rib   04/27/2014 Initial Biopsy U/S guided liver biopsy, lesion in anterior and inferior left hepatic lobe biopsied   04/27/2014 Pathology Results Metastatic adenocarcinoma, CK7, ER+, patchy positivity with PR. Possible primary includes breast, less likely gynecologic   05/15/2014 Mammogram BI-RADS CATEGORY  2: Benign Finding(s)   05/16/2014 PET scan 1. Intensely hypermetabolic hepatic metastasis. 2. Widespread hypermetabolic skeletal lesions. 3. No primary adenocarcinoma identified by FDG PET imaging.   05/19/2014 Imaging MUGA- Left ventricular ejection fracture greater than 70%.   05/21/2014 Breast MRI No suspicious masses or enhancement within the breasts. No axillary adenopathy.   05/22/2014 - 07/03/2014 Antibody Plan Herceptin/Perjeta/Tamoxifen   06/12/2014 - 07/03/2014 Chemotherapy Taxotere added secondary to persistent abdominal and back pain   06/17/2014 - 06/19/2014 Hospital Admission Neutropenia, fever, diarrhea, nausea, vomiting   06/20/2014 - 07/10/2014 Radiation Therapy Dr. Thea Silversmith 12 fractions to L3-S3 (30 Gy) and left scapula (20 Gy).    07/03/2014 Adverse Reaction Perjeta- induced diarrhea.  Perjeta discontinued   07/16/2014 - 07/20/2014 Hospital Admission Electrolyte abnormalities, and diarrhea.  Suspect Perjeta-induced diarrhea.  Negative GI work-up.   07/24/2014 -  Chemotherapy Herceptin/Tamoxifen/Xgeva   08/21/2014 Imaging MUGA- Left ventricular ejection fraction equals 71%.   08/24/2014 PET scan Dramatic reduction in  metabolic activity of the widespread liver metastasis. Liver metastasis now have metabolic activity equal to background normal liver activity. Liver has a nodular contour. Marked reduction in metabolic activity of skeletal lesions..   10/05/2014 Progression Widespread metastatic disease to the brain as described. Between 20 and 30 intracranial metastatic deposits are now seen. No midline shift or incipient herniation   10/09/2014 - 10/26/2014 Radiation Therapy Whole Brain XRT   11/14/2014 Imaging MUGA- LVEF 67%   02/13/2015 Imaging MUGA- LVEF 59%   02/15/2015 Treatment Plan Change Due to declining LVEF, will hold Herceptin per PI guidelines.   Stage IV adenocarcinoma with metastases to liver and bone  Had last mammogram in [place] this year, Dr. Gaetano Net  Colonoscopy 05/18/2013  Breast Reduction 03/17/2011  CURRENT THERAPY: Herceptin/Tamoxifen/XGEVA   INTERVAL HISTORY: [name] 49 y.o. female returns for follow-up of stage IV adenocarcinoma of the breast, ER+, HER 2 + disease. She continues on Tamoxifen and XGEVA and Herceptin.  [name] is here with her husband today. Her husband recently retired to be able to help more out at home. They will be traveling on a cruise to [place] on January [place]. She notes that she has enough scopolamine patches for the trip.  She has not been able to decrease her pain medications and has instead increased her break through pain medications. She reports pain in her Left shoulder and pain in her lower back while doing  tasks like washing dishes. During physical exam, the patient experienced visible pain when transitioning to lay flat on her back. She noted that laying flat causes her pain in her "tail bone".  [name] reports an increase in appetite. She continues to take ritalin daily. She reports that her mood has  gone up and down, as her one year diagnosis anniversary affected her greatly, along with seeing family for the  holidays.  MEDICAL HISTORY: Past Medical History  Diagnosis Date  . PONV (postoperative nausea and vomiting)     pt states scope patch does well  . Anemia   . Depression   . Anxiety   . Headache(784.0)     has migraines - medication controls  . Family history of prostate cancer   . Hot flashes due to tamoxifen 09/25/2014  . Breast cancer ([place]) 04/2014    Presumed dx; stage 4 w/ mets to bone and liver, brain lesions  . Bony metastasis ([place])   . S/P small bowel resection   . Drug-induced cardiomyopathy ([place]) 02/15/2015    has Hyperlipidemia; Depression; Breast cancer, stage 4 ([place]); Bone metastases ([place]); DVT (deep venous thrombosis) ([place]); GERD (gastroesophageal reflux disease); Family history of prostate cancer; Genetic testing; Hot flashes due to tamoxifen; Brain metastases ([place]); and Drug-induced cardiomyopathy ([place]) on her problem list.      has No Known Allergies.  We administered pneumococcal 23 valent vaccine.  SURGICAL HISTORY: Past Surgical History  Procedure Laterality Date  . Right rotator cuff      2002  . Neck fusion      2003  . Laparoscopic cholecystectomy      2004  . Right knee arthroscopy      2005  . Appendectomy      2008  . Abdominal hysterectomy    . Breast reduction surgery  03/17/2011    Procedure: MAMMARY REDUCTION BILATERAL (BREAST);  Surgeon: Mary A Contogiannis;  Location: [facility];  Service: Plastics;  Laterality: Bilateral;  . Colonoscopy N/A 07/13/2013    Procedure: COLONOSCOPY;  Surgeon: Rogene Houston, MD;  [facility];  Service: Endoscopy;  Laterality: N/A;  930  . Liver biopsy  04/2014  . Portacath placement    . Esophagogastroduodenoscopy N/A 05/25/2014    Procedure: ESOPHAGOGASTRODUODENOSCOPY (EGD);  Surgeon: Rogene Houston, MD;  [facility];  Service: Endoscopy;  Laterality: N/A;  155  . Laparoscopic appendectomy    . Colonoscopy N/A 11/26/2014    Procedure: COLONOSCOPY;  Surgeon: Rogene Houston, MD;  [facility];  Service: Endoscopy;  Laterality: N/A;  730    SOCIAL HISTORY: Social History   Social History  . Marital Status: Married    Spouse Name: N/A  . Number of Children: N/A  . Years of Education: N/A   Occupational History  . Not on file.   Social History Main Topics  . Smoking status: Former Smoker    Types: Cigarettes    Quit date: 08/20/1994  . Smokeless tobacco: Never Used  . Alcohol Use: Yes     Comment: Occasionally  . Drug Use: No  . Sexual Activity: Yes    Birth Control/ Protection: Surgical   Other Topics Concern  . Not on file   Social History Narrative    FAMILY HISTORY: Family History  Problem Relation Age of Onset  . Diabetes Father   . Heart attack Maternal Grandmother 30    multiple over lifetime.  . Cancer Maternal Grandmother 47    NOS  . Prostate cancer Maternal Grandfather     dx in his 24s  . Lung cancer Paternal Grandfather     dx <50  . Lymphoma Maternal Aunt     dx in her 60s  . Melanoma Cousin 48    maternal first  cousin  . Brain cancer Cousin     paternal first cousin dx under 66  . Prostate cancer Other     MGF's father  . Colon cancer Other     MGM's mother  Her son lives in [place]. Her daughter lives in [place].  Review of Systems  Constitutional: Negative for fever, chills, weight loss.  Increase in appetite. HENT: . Negative for congestion, hearing loss, sore throat and tinnitus.   Eyes: Negative for double vision, pain and discharge.  Respiratory:  Negative for cough, hemoptysis, sputum production, shortness of breath and wheezing.   Cardiovascular: Negative for chest pain, palpitations, claudication, and PND.  Gastrointestinal: Negative for heartburn, abdominal pain, diarrhea, constipation, blood in stool and melena, nausea, vomiting. Genitourinary:Negative for dysuria, urgency, frequency and hematuria.  Musculoskeletal: Positive for joint pain, shoulder pain, and back pain. Negative  for myalgias and falls.  Skin: Negative for itching and rash.  Neurological: . Negative for tingling, tremors, speech change, focal weakness, seizures, loss of consciousness, weakness. Headaches or blurry vision Endo/Heme/Allergies: Does not bruise/bleed easily.  Psychiatric/Behavioral: Negative for suicidal ideas, memory loss and substance abuse, nervous/axiety, depression.  14 point review of systems was performed and is negative except as detailed under history of present illness and above  PHYSICAL EXAMINATION  ECOG PERFORMANCE STATUS: 1 - Symptomatic but completely ambulatory  Filed Vitals:   05/10/15 0800  BP: 114/72  Pulse: 96  Temp: 97.9 F (36.6 C)  Resp: 18    Physical Exam  Constitutional: She is oriented to person, place, and time and well-developed, well-nourished, and in no distress. Facial Cushingoid features.  Mood is good. Her cheeks appear thinner. She still has a buffalo hump on her back. HENT:  Head: Normocephalic and atraumatic. Hair regrowth. Nose: Nose normal.  Mouth/Throat:  Negative Eyes: Conjunctivae and EOM are normal. Pupils are equal, round, and reactive to light. Right eye exhibits no discharge. Left eye exhibits no discharge. No scleral icterus.  Neck: Normal range of motion. No tracheal deviation present. No thyromegaly present.  Cardiovascular: Normal rate, regular rhythm and normal heart sounds.  Exam reveals no gallop and no friction rub.   No murmur heard. Pulmonary/Chest: Effort normal and breath sounds normal. She has no wheezes. She has no rales.  Abdominal: Soft. Bowel sounds are normal. She exhibits no distension and no mass. There is no tenderness. There is no rebound and no guarding.  Musculoskeletal: Normal range of motion.  Notable distress when positioning to lay flat on her back. Lymphadenopathy:    She has no cervical adenopathy.  Neurological: She is alert and oriented to person, place, and time. She has normal reflexes. No cranial  nerve deficit. Gait normal. Coordination normal.  Skin: Skin is warm and dry. No rash noted.  Psychiatric: Mood, memory, affect and judgment normal.  Nursing note and vitals reviewed.  LABORATORY DATA: I have reviewed the data as listed.  CBC    Component Value Date/Time   WBC 5.1 05/03/2015 1144   RBC 3.78* 05/03/2015 1144   HGB 12.0 05/03/2015 1144   HCT 36.7 05/03/2015 1144   PLT 201 05/03/2015 1144   MCV 97.1 05/03/2015 1144   MCH 31.7 05/03/2015 1144   MCHC 32.7 05/03/2015 1144   RDW 13.2 05/03/2015 1144   LYMPHSABS 0.9 05/03/2015 1144   MONOABS 0.5 05/03/2015 1144   EOSABS 0.1 05/03/2015 1144   BASOSABS 0.0 05/03/2015 1144   CMP     Component Value Date/Time   NA 140 05/03/2015  1144   K 3.3* 05/03/2015 1144   CL 106 05/03/2015 1144   CO2 25 05/03/2015 1144   GLUCOSE 129* 05/03/2015 1144   BUN 9 05/03/2015 1144   CREATININE 0.82 05/03/2015 1144   CALCIUM 8.9 05/03/2015 1144   PROT 6.1* 05/03/2015 1144   ALBUMIN 3.7 05/03/2015 1144   AST 29 05/03/2015 1144   ALT 24 05/03/2015 1144   ALKPHOS 26* 05/03/2015 1144   BILITOT 0.5 05/03/2015 1144   GFRNONAA >60 05/03/2015 1144   GFRAA >60 05/03/2015 1144    RADIOLOGY: Study Result     CLINICAL DATA: Stage IV breast cancer. Recent fall with abnormal head CT suggesting skullbase metastasis.  EXAM: MRI HEAD WITHOUT AND WITH CONTRAST  TECHNIQUE: Multiplanar, multiecho pulse sequences of the brain and surrounding structures were obtained without and with intravenous contrast.  CONTRAST: 72m MULTIHANCE GADOBENATE DIMEGLUMINE 529 MG/ML IV SOLN  COMPARISON: CT 03/22/2015. MRI 12/24/2014.  FINDINGS: Diffusion imaging does not show any acute or subacute infarction. 6 mm metastatic focus to the left of midline in the clivus is stable over time. No extraosseous extension.  Known brain metastases all continue to get smaller. The largest lesion in the caudate head on the right has decreased in size  from 5.4 mm to 3.2 mm. A few other punctate metastases are seen within the cerebral hemispheres and within the cerebellum, all getting smaller.  No evidence of intracranial hemorrhage, hydrocephalus or extra-axial collection. No new or progressive lesions. Small amount of fluid in the mastoid air cells bilaterally.  IMPRESSION: No acute intracranial finding. Continued improvement with reduction in size of all intracranial metastatic lesions. Many are no longer visible at all. The largest lesion in the right caudate head has decreased in size from 5.4 mm to 3.2 mm.  Small metastasis in the left side of the clivus is stable.   Electronically Signed  By: MNelson ChimesM.D.  On: 03/29/2015 16:56   Study Result     CLINICAL DATA: Subsequent treatment strategy for breast cancer.  EXAM: NUCLEAR MEDICINE PET SKULL BASE TO THIGH  TECHNIQUE: 15.5 mCi F-18 FDG was injected intravenously. Full-ring PET imaging was performed from the skull base to thigh after the radiotracer. CT data was obtained and used for attenuation correction and anatomic localization.  FASTING BLOOD GLUCOSE: Value: 88 mg/dl  COMPARISON: 11/01/2014.  FINDINGS: NECK  No hypermetabolic lymph nodes in the neck.  CHEST  No hypermetabolic mediastinal or hilar nodes. No suspicious pulmonary nodules on the CT scan.  Right Port-A-Cath tip is at the distal SVC level.  ABDOMEN/PELVIS  No abnormal hypermetabolic activity within the liver, pancreas, adrenal glands, or spleen. No hypermetabolic lymph nodes in the abdomen or pelvis.  Liver contour remains nodular with parenchyma heterogeneous in appearance. As described previously this remains compatible with treated hepatic metastases.  9 mm nodule in the left anterior abdominal wall subcutaneous fat shows mild hypermetabolism. This would be an unusual location for metastatic disease and is felt to be most likely related  to infection/inflammation or injection granuloma.  SKELETON  No focal hypermetabolic activity to suggest skeletal metastasis. Bone windows show innumerable sclerotic lesions throughout the skeleton which are stable. Index lesion in the L4 vertebral body measures 16 mm today, unchanged since previous. The fracture of the left acromion is again noted and FDG uptake in the region of the fracture is presumably related to healing.  IMPRESSION: 1. Stable exam. No definite evidence for hypermetabolic metastases. There is a small nodule in the left anterior  abdominal wall subcutaneous fat that shows low level FDG uptake, likely related to small focus of infection/inflammation or injection granuloma. Continued attention to this area on followup is recommended. 2. Healing fracture of the left acromion again noted. Uptake in this region compatible with granulation. 3. Nodular heterogeneous liver compatible with treated metastases. No focal hypermetabolism today to suggest hypermetabolic liver disease.   Electronically Signed  By: Misty Stanley M.D.  On: 03/29/2015 09:50   Study Result     CLINICAL DATA: Fall yesterday, history of breast carcinoma with metastatic disease to the brain  EXAM: CT HEAD WITHOUT CONTRAST  TECHNIQUE: Contiguous axial images were obtained from the base of the skull through the vertex without intravenous contrast.  COMPARISON: 12/24/2014 04/26/2014  FINDINGS: The bony calvarium is intact some bony sclerosis in the clivus just to the left of the midline may represent bony metastatic disease. No findings to suggest acute hemorrhage, acute infarction or space-occupying mass lesion are noted. The known brain metastatic lesions are not well appreciated on this study.  IMPRESSION: Possible bony metastatic disease in the skullbase.  No acute abnormality is seen.   Electronically Signed  By: Inez Catalina M.D.  On: 03/22/2015 13:31    CLINICAL DATA: Breast cancer, cardiotoxic chemotherapy  EXAM: NUCLEAR MEDICINE CARDIAC BLOOD POOL IMAGING (MUGA)  TECHNIQUE: Cardiac multi-gated acquisition was performed at rest following intravenous injection of Tc-51mlabeled red blood cells.  RADIOPHARMACEUTICALS: 23.6 mCi Tc-922mn-vitro labeled autologous red blood cells IV  COMPARISON: 04/01/2015  FINDINGS: Calculated LEFT ventricular ejection fraction is 58%, within normal range.  This is not significantly changed from the 60% on the previous exam.  Wall motion analysis of the LEFT ventricle in 3 projections demonstrates normal LEFT ventricular wall motion.  IMPRESSION: Normal LEFT ventricular ejection fraction of 58% not significantly changed from the 60% on 04/01/2015.  Normal LV wall motion.   Electronically Signed  By: MaLavonia Dana.D.  On: 05/09/2015 11:01    ASSESSMENT and THERAPY PLAN:  Stage IV ER positive, HER-2 positive carcinoma of the breast Widespread Brain Metastases End of Life issues Insomnia Anxiety UE DVT, RIJ and R subclavian Cancer related Fatigue Decline in EF, Herceptin held. Herceptin restarted on 04/12/2015  After a long discussion we opted to proceed with re-institution of herceptin. We reviewed her MUGA from yesterday. She is doing well.  Her disease remains well controlled.  We will continue forward with close monitoring of her EF moving forward. She is to continue on Tamoxifen/XGEVA.  I have refilled her medications.   Suggested for [name] to try two Aleve daily to assist her pain medication in the management of her shoulder and joint pain.  She will return for her next cycle of chemotherapy on January 12th before leaving for her trip on January 13th. I will see her again on January 27th when she returns for her next XGEVA shot.    Orders Placed This Encounter  Procedures  . SCHEDULING COMMUNICATION    Schedule 15 minute injection appointment    All questions were answered. The patient knows to call the clinic with any problems, questions or concerns. We can certainly see the patient much sooner if necessary.   This note was signed electronically.  This document serves as a record of services personally performed by ShAncil LinseyMD. It was created on her behalf by ElArlyce Harmana trained medical scribe. The creation of this record is based on the scribe's personal observations and the provider's statements to them. This document  has been checked and approved by the attending provider.  I have reviewed the above documentation for accuracy and completeness, and I agree with the above.  Kelby Fam. Whitney Muse, MD

## 2015-05-10 NOTE — Patient Instructions (Addendum)
[place] at [facility] Discharge Instructions  RECOMMENDATIONS MADE BY THE CONSULTANT AND ANY TEST RESULTS WILL BE SENT TO YOUR REFERRING PHYSICIAN.   Exam completed by Dr Whitney Muse today Jayme Cloud today X-geva every 28 days Ritalin refill herceptin every 3 weeks cipro faxed in to your pharmacy in case you need them Return to see the doctor in 1/27 with x-geva appt Please call the clinic if you have any questions or concerns    Thank you for choosing [facility] to provide your oncology and hematology care.  To afford each patient quality time with our provider, please arrive at least 15 minutes before your scheduled appointment time.    You need to re-schedule your appointment should you arrive 10 or more minutes late.  We strive to give you quality time with our providers, and arriving late affects you and other patients whose appointments are after yours.  Also, if you no show three or more times for appointments you may be dismissed from the clinic at the providers discretion.     Again, thank you for choosing [facility].  Our hope is that these requests will decrease the amount of time that you wait before being seen by our physicians.       _____________________________________________________________  Should you have questions after your visit to [facility], please contact our office at [phone] between the hours of 8:30 a.m. and 4:30 p.m.  Voicemails left after 4:30 p.m. will not be returned until the following business day.  For prescription refill requests, have your pharmacy contact our office.

## 2015-05-10 NOTE — Progress Notes (Signed)
..  [name] presents today for injection per the provider's orders.  xgeva administration without incident; see MAR for injection details.  Patient tolerated procedure well and without incident.  No questions or complaints noted at this time. 

## 2015-05-22 ENCOUNTER — Other Ambulatory Visit (HOSPITAL_COMMUNITY): Payer: Self-pay | Admitting: Oncology

## 2015-05-22 ENCOUNTER — Telehealth (HOSPITAL_COMMUNITY): Payer: Self-pay | Admitting: *Deleted

## 2015-05-22 DIAGNOSIS — R600 Localized edema: Secondary | ICD-10-CM

## 2015-05-22 MED ORDER — FUROSEMIDE 20 MG PO TABS: 40.0000 mg | ORAL_TABLET | Freq: Every day | ORAL | Status: DC | PRN

## 2015-05-23 ENCOUNTER — Encounter (HOSPITAL_COMMUNITY): Payer: Self-pay

## 2015-05-23 ENCOUNTER — Encounter (HOSPITAL_COMMUNITY): Payer: 59 | Attending: Hematology & Oncology

## 2015-05-23 VITALS — BP 119/83 | HR 72 | Temp 98.3°F | Resp 20 | Wt 260.0 lb

## 2015-05-23 DIAGNOSIS — C229 Malignant neoplasm of liver, not specified as primary or secondary: Secondary | ICD-10-CM | POA: Diagnosis not present

## 2015-05-23 DIAGNOSIS — C50919 Malignant neoplasm of unspecified site of unspecified female breast: Secondary | ICD-10-CM

## 2015-05-23 DIAGNOSIS — C7951 Secondary malignant neoplasm of bone: Secondary | ICD-10-CM | POA: Insufficient documentation

## 2015-05-23 DIAGNOSIS — Z5112 Encounter for antineoplastic immunotherapy: Secondary | ICD-10-CM

## 2015-05-23 LAB — CBC WITH DIFFERENTIAL/PLATELET
BASOS ABS: 0 10*3/uL (ref 0.0–0.1)
Basophils Relative: 0 %
EOS ABS: 0.1 10*3/uL (ref 0.0–0.7)
EOS PCT: 2 %
HCT: 36.7 % (ref 36.0–46.0)
Hemoglobin: 12 g/dL (ref 12.0–15.0)
LYMPHS PCT: 16 %
Lymphs Abs: 0.8 10*3/uL (ref 0.7–4.0)
MCH: 30.9 pg (ref 26.0–34.0)
MCHC: 32.7 g/dL (ref 30.0–36.0)
MCV: 94.6 fL (ref 78.0–100.0)
MONO ABS: 0.5 10*3/uL (ref 0.1–1.0)
Monocytes Relative: 9 %
Neutro Abs: 3.5 10*3/uL (ref 1.7–7.7)
Neutrophils Relative %: 73 %
PLATELETS: 206 10*3/uL (ref 150–400)
RBC: 3.88 MIL/uL (ref 3.87–5.11)
RDW: 12.8 % (ref 11.5–15.5)
WBC: 4.9 10*3/uL (ref 4.0–10.5)

## 2015-05-23 LAB — COMPREHENSIVE METABOLIC PANEL
ALT: 22 U/L (ref 14–54)
AST: 28 U/L (ref 15–41)
Albumin: 3.5 g/dL (ref 3.5–5.0)
Alkaline Phosphatase: 34 U/L — ABNORMAL LOW (ref 38–126)
Anion gap: 7 (ref 5–15)
BUN: 9 mg/dL (ref 6–20)
CALCIUM: 9 mg/dL (ref 8.9–10.3)
CO2: 27 mmol/L (ref 22–32)
Chloride: 107 mmol/L (ref 101–111)
Creatinine, Ser: 0.77 mg/dL (ref 0.44–1.00)
GLUCOSE: 158 mg/dL — AB (ref 65–99)
POTASSIUM: 3.5 mmol/L (ref 3.5–5.1)
SODIUM: 141 mmol/L (ref 135–145)
Total Bilirubin: 0.3 mg/dL (ref 0.3–1.2)
Total Protein: 6.1 g/dL — ABNORMAL LOW (ref 6.5–8.1)

## 2015-05-23 MED ORDER — DIPHENHYDRAMINE HCL 25 MG PO CAPS
50.0000 mg | ORAL_CAPSULE | Freq: Once | ORAL | Status: AC
  Administered 2015-05-23: 50 mg via ORAL
  Filled 2015-05-23: qty 2

## 2015-05-23 MED ORDER — TRASTUZUMAB CHEMO INJECTION 440 MG
6.0000 mg/kg | Freq: Once | INTRAVENOUS | Status: AC
  Administered 2015-05-23: 672 mg via INTRAVENOUS
  Filled 2015-05-23: qty 32

## 2015-05-23 MED ORDER — HEPARIN SOD (PORK) LOCK FLUSH 100 UNIT/ML IV SOLN
INTRAVENOUS | Status: AC
  Filled 2015-05-23: qty 5

## 2015-05-23 MED ORDER — SODIUM CHLORIDE 0.9 % IJ SOLN: 10.0000 mL | INTRAMUSCULAR | Status: DC | PRN

## 2015-05-23 MED ORDER — ACETAMINOPHEN 325 MG PO TABS
650.0000 mg | ORAL_TABLET | Freq: Once | ORAL | Status: AC
  Administered 2015-05-23: 650 mg via ORAL
  Filled 2015-05-23: qty 2

## 2015-05-23 MED ORDER — HEPARIN SOD (PORK) LOCK FLUSH 100 UNIT/ML IV SOLN
500.0000 [IU] | Freq: Once | INTRAVENOUS | Status: AC | PRN
  Administered 2015-05-23: 500 [IU]

## 2015-05-23 MED ORDER — SODIUM CHLORIDE 0.9 % IV SOLN
Freq: Once | INTRAVENOUS | Status: AC
  Administered 2015-05-23: 13:00:00 via INTRAVENOUS

## 2015-05-23 NOTE — Progress Notes (Signed)
Tolerated chemo well. Ambulatory on discharge home with husband. 

## 2015-05-23 NOTE — Patient Instructions (Signed)
[facility] Discharge Instructions for Patients Receiving Chemotherapy  Today you received the following chemotherapy agents Herceptin.  To help prevent nausea and vomiting after your treatment, we encourage you to take your nausea medication as instructed. If you develop nausea and vomiting that is not controlled by your nausea medication, call the clinic. If it is after clinic hours your family physician or the after hours number for the clinic or go to the Emergency Department. BELOW ARE SYMPTOMS THAT SHOULD BE REPORTED IMMEDIATELY:  *FEVER GREATER THAN 101.0 F  *CHILLS WITH OR WITHOUT FEVER  NAUSEA AND VOMITING THAT IS NOT CONTROLLED WITH YOUR NAUSEA MEDICATION  *UNUSUAL SHORTNESS OF BREATH  *UNUSUAL BRUISING OR BLEEDING  TENDERNESS IN MOUTH AND THROAT WITH OR WITHOUT PRESENCE OF ULCERS  *URINARY PROBLEMS  *BOWEL PROBLEMS  UNUSUAL RASH Items with * indicate a potential emergency and should be followed up as soon as possible.  Return as scheduled.  I have been informed and understand all the instructions given to me. I know to contact the clinic, my physician, or go to the Emergency Department if any problems should occur. I do not have any questions at this time, but understand that I may call the clinic during office hours or the Patient Navigator at [phone] should I have any questions or need assistance in obtaining follow up care.    __________________________________________  _____________  __________ Signature of Patient or Authorized Representative            Date                   Time    __________________________________________ Nurse's Signature  

## 2015-05-24 ENCOUNTER — Inpatient Hospital Stay (HOSPITAL_COMMUNITY): Payer: 59

## 2015-06-02 ENCOUNTER — Emergency Department (HOSPITAL_COMMUNITY): Payer: 59

## 2015-06-02 ENCOUNTER — Inpatient Hospital Stay (HOSPITAL_COMMUNITY)
Admission: EM | Admit: 2015-06-02 | Discharge: 2015-06-08 | DRG: 194 | Disposition: A | Discharge status: Home / Self Care | Payer: 59 | Attending: Internal Medicine | Admitting: Internal Medicine

## 2015-06-02 ENCOUNTER — Encounter (HOSPITAL_COMMUNITY): Payer: Self-pay | Admitting: Emergency Medicine

## 2015-06-02 DIAGNOSIS — Y95 Nosocomial condition: Secondary | ICD-10-CM | POA: Diagnosis present

## 2015-06-02 DIAGNOSIS — Z801 Family history of malignant neoplasm of trachea, bronchus and lung: Secondary | ICD-10-CM | POA: Diagnosis not present

## 2015-06-02 DIAGNOSIS — Z981 Arthrodesis status: Secondary | ICD-10-CM

## 2015-06-02 DIAGNOSIS — F32A Depression, unspecified: Secondary | ICD-10-CM | POA: Diagnosis present

## 2015-06-02 DIAGNOSIS — R197 Diarrhea, unspecified: Secondary | ICD-10-CM | POA: Diagnosis present

## 2015-06-02 DIAGNOSIS — R05 Cough: Secondary | ICD-10-CM | POA: Diagnosis present

## 2015-06-02 DIAGNOSIS — Z9221 Personal history of antineoplastic chemotherapy: Secondary | ICD-10-CM | POA: Diagnosis not present

## 2015-06-02 DIAGNOSIS — K219 Gastro-esophageal reflux disease without esophagitis: Secondary | ICD-10-CM | POA: Diagnosis present

## 2015-06-02 DIAGNOSIS — Z86718 Personal history of other venous thrombosis and embolism: Secondary | ICD-10-CM | POA: Diagnosis not present

## 2015-06-02 DIAGNOSIS — R633 Feeding difficulties: Secondary | ICD-10-CM | POA: Diagnosis not present

## 2015-06-02 DIAGNOSIS — T451X5A Adverse effect of antineoplastic and immunosuppressive drugs, initial encounter: Secondary | ICD-10-CM | POA: Diagnosis present

## 2015-06-02 DIAGNOSIS — D899 Disorder involving the immune mechanism, unspecified: Secondary | ICD-10-CM | POA: Diagnosis present

## 2015-06-02 DIAGNOSIS — Z800 Family history of malignant neoplasm of digestive organs: Secondary | ICD-10-CM

## 2015-06-02 DIAGNOSIS — J189 Pneumonia, unspecified organism: Secondary | ICD-10-CM | POA: Diagnosis present

## 2015-06-02 DIAGNOSIS — E669 Obesity, unspecified: Secondary | ICD-10-CM | POA: Diagnosis present

## 2015-06-02 DIAGNOSIS — Z7901 Long term (current) use of anticoagulants: Secondary | ICD-10-CM

## 2015-06-02 DIAGNOSIS — Z8249 Family history of ischemic heart disease and other diseases of the circulatory system: Secondary | ICD-10-CM | POA: Diagnosis not present

## 2015-06-02 DIAGNOSIS — C7931 Secondary malignant neoplasm of brain: Secondary | ICD-10-CM | POA: Diagnosis present

## 2015-06-02 DIAGNOSIS — Z833 Family history of diabetes mellitus: Secondary | ICD-10-CM

## 2015-06-02 DIAGNOSIS — C7951 Secondary malignant neoplasm of bone: Secondary | ICD-10-CM | POA: Diagnosis present

## 2015-06-02 DIAGNOSIS — Z807 Family history of other malignant neoplasms of lymphoid, hematopoietic and related tissues: Secondary | ICD-10-CM | POA: Diagnosis not present

## 2015-06-02 DIAGNOSIS — Z6841 Body Mass Index (BMI) 40.0 and over, adult: Secondary | ICD-10-CM | POA: Diagnosis not present

## 2015-06-02 DIAGNOSIS — Z87891 Personal history of nicotine dependence: Secondary | ICD-10-CM

## 2015-06-02 DIAGNOSIS — C50919 Malignant neoplasm of unspecified site of unspecified female breast: Secondary | ICD-10-CM | POA: Diagnosis present

## 2015-06-02 DIAGNOSIS — F329 Major depressive disorder, single episode, unspecified: Secondary | ICD-10-CM | POA: Diagnosis present

## 2015-06-02 DIAGNOSIS — Z79810 Long term (current) use of selective estrogen receptor modulators (SERMs): Secondary | ICD-10-CM | POA: Diagnosis not present

## 2015-06-02 DIAGNOSIS — C799 Secondary malignant neoplasm of unspecified site: Secondary | ICD-10-CM | POA: Diagnosis not present

## 2015-06-02 DIAGNOSIS — Z808 Family history of malignant neoplasm of other organs or systems: Secondary | ICD-10-CM | POA: Diagnosis not present

## 2015-06-02 DIAGNOSIS — C787 Secondary malignant neoplasm of liver and intrahepatic bile duct: Secondary | ICD-10-CM | POA: Diagnosis present

## 2015-06-02 LAB — CBC
HEMATOCRIT: 36.5 % (ref 36.0–46.0)
Hemoglobin: 12 g/dL (ref 12.0–15.0)
MCH: 30.8 pg (ref 26.0–34.0)
MCHC: 32.9 g/dL (ref 30.0–36.0)
MCV: 93.8 fL (ref 78.0–100.0)
Platelets: 181 10*3/uL (ref 150–400)
RBC: 3.89 MIL/uL (ref 3.87–5.11)
RDW: 13 % (ref 11.5–15.5)
WBC: 4.6 10*3/uL (ref 4.0–10.5)

## 2015-06-02 LAB — BASIC METABOLIC PANEL
Anion gap: 9 (ref 5–15)
BUN: 5 mg/dL — AB (ref 6–20)
CHLORIDE: 106 mmol/L (ref 101–111)
CO2: 26 mmol/L (ref 22–32)
Calcium: 8.8 mg/dL — ABNORMAL LOW (ref 8.9–10.3)
Creatinine, Ser: 0.76 mg/dL (ref 0.44–1.00)
GFR calc Af Amer: >60 mL/min (ref >60)
GFR calc non Af Amer: >60 mL/min (ref >60)
Glucose, Bld: 121 mg/dL — ABNORMAL HIGH (ref 65–99)
POTASSIUM: 3.5 mmol/L (ref 3.5–5.1)
SODIUM: 141 mmol/L (ref 135–145)

## 2015-06-02 LAB — STREP PNEUMONIAE URINARY ANTIGEN: STREP PNEUMO URINARY ANTIGEN: NEGATIVE

## 2015-06-02 LAB — I-STAT CHEM 8, ED
BUN: <3 mg/dL — ABNORMAL LOW (ref 6–20)
CALCIUM ION: 1.15 mmol/L (ref 1.12–1.23)
Chloride: 101 mmol/L (ref 101–111)
Creatinine, Ser: 0.7 mg/dL (ref 0.44–1.00)
GLUCOSE: 114 mg/dL — AB (ref 65–99)
HCT: 35 % — ABNORMAL LOW (ref 36.0–46.0)
HEMOGLOBIN: 11.9 g/dL — AB (ref 12.0–15.0)
Potassium: 3.3 mmol/L — ABNORMAL LOW (ref 3.5–5.1)
Sodium: 142 mmol/L (ref 135–145)
TCO2: 25 mmol/L (ref 0–100)

## 2015-06-02 LAB — I-STAT CG4 LACTIC ACID, ED
LACTIC ACID, VENOUS: 1.83 mmol/L (ref 0.5–2.0)
LACTIC ACID, VENOUS: 1.6 mmol/L (ref 0.5–2.0)

## 2015-06-02 LAB — HEPATIC FUNCTION PANEL
ALK PHOS: 32 U/L — AB (ref 38–126)
ALT: 25 U/L (ref 14–54)
AST: 30 U/L (ref 15–41)
Albumin: 3.4 g/dL — ABNORMAL LOW (ref 3.5–5.0)
BILIRUBIN DIRECT: 0.1 mg/dL (ref 0.1–0.5)
BILIRUBIN INDIRECT: 0.2 mg/dL — AB (ref 0.3–0.9)
Total Bilirubin: 0.3 mg/dL (ref 0.3–1.2)
Total Protein: 5.7 g/dL — ABNORMAL LOW (ref 6.5–8.1)

## 2015-06-02 LAB — TROPONIN I: Troponin I: 0.03 ng/mL (ref <0.031)

## 2015-06-02 MED ORDER — VANCOMYCIN HCL 10 G IV SOLR
1250.0000 mg | Freq: Two times a day (BID) | INTRAVENOUS | Status: DC
  Filled 2015-06-02 (×3): qty 1250

## 2015-06-02 MED ORDER — DIPHENHYDRAMINE HCL 25 MG PO CAPS
25.0000 mg | ORAL_CAPSULE | Freq: Four times a day (QID) | ORAL | Status: DC | PRN
  Filled 2015-06-02: qty 1

## 2015-06-02 MED ORDER — MORPHINE SULFATE ER 30 MG PO TBCR
30.0000 mg | EXTENDED_RELEASE_TABLET | Freq: Two times a day (BID) | ORAL | Status: DC
  Administered 2015-06-02 – 2015-06-08 (×12): 30 mg via ORAL
  Filled 2015-06-02 (×12): qty 1

## 2015-06-02 MED ORDER — VANCOMYCIN HCL IN DEXTROSE 1-5 GM/200ML-% IV SOLN
1000.0000 mg | Freq: Once | INTRAVENOUS | Status: AC
  Administered 2015-06-02: 1000 mg via INTRAVENOUS
  Filled 2015-06-02: qty 200

## 2015-06-02 MED ORDER — LORAZEPAM 0.5 MG PO TABS: 0.5000 mg | ORAL_TABLET | Freq: Four times a day (QID) | ORAL | Status: DC | PRN

## 2015-06-02 MED ORDER — ENSURE ENLIVE PO LIQD: 237.0000 mL | Freq: Two times a day (BID) | ORAL | Status: DC

## 2015-06-02 MED ORDER — PIPERACILLIN-TAZOBACTAM 3.375 G IVPB
3.3750 g | Freq: Once | INTRAVENOUS | Status: AC
Start: 2015-06-02 — End: 2015-06-02
  Administered 2015-06-02: 3.375 g via INTRAVENOUS
  Filled 2015-06-02: qty 50

## 2015-06-02 MED ORDER — MECLIZINE HCL 12.5 MG PO TABS: 25.0000 mg | ORAL_TABLET | Freq: Four times a day (QID) | ORAL | Status: DC | PRN

## 2015-06-02 MED ORDER — PANTOPRAZOLE SODIUM 40 MG PO TBEC
40.0000 mg | DELAYED_RELEASE_TABLET | Freq: Every day | ORAL | Status: DC
  Administered 2015-06-03 – 2015-06-08 (×6): 40 mg via ORAL
  Filled 2015-06-02 (×6): qty 1

## 2015-06-02 MED ORDER — POTASSIUM CHLORIDE CRYS ER 20 MEQ PO TBCR
20.0000 meq | EXTENDED_RELEASE_TABLET | Freq: Two times a day (BID) | ORAL | Status: DC
Start: 2015-06-02 — End: 2015-06-08
  Administered 2015-06-02 – 2015-06-08 (×12): 20 meq via ORAL
  Filled 2015-06-02 (×12): qty 1

## 2015-06-02 MED ORDER — VENLAFAXINE HCL ER 75 MG PO CP24
150.0000 mg | ORAL_CAPSULE | Freq: Every day | ORAL | Status: DC
  Administered 2015-06-03 – 2015-06-08 (×6): 150 mg via ORAL
  Filled 2015-06-02 (×6): qty 2

## 2015-06-02 MED ORDER — ONDANSETRON HCL 4 MG PO TABS
8.0000 mg | ORAL_TABLET | Freq: Three times a day (TID) | ORAL | Status: DC | PRN
  Administered 2015-06-03 – 2015-06-05 (×2): 8 mg via ORAL
  Filled 2015-06-02 (×2): qty 2

## 2015-06-02 MED ORDER — ZOLPIDEM TARTRATE 5 MG PO TABS
5.0000 mg | ORAL_TABLET | Freq: Every evening | ORAL | Status: DC | PRN
  Administered 2015-06-02 – 2015-06-07 (×6): 5 mg via ORAL
  Filled 2015-06-02 (×6): qty 1

## 2015-06-02 MED ORDER — DEXTROSE 5 % IV SOLN
1.0000 g | Freq: Three times a day (TID) | INTRAVENOUS | Status: DC
  Administered 2015-06-02 – 2015-06-03 (×3): 1 g via INTRAVENOUS
  Filled 2015-06-02 (×6): qty 1

## 2015-06-02 MED ORDER — SODIUM CHLORIDE 0.9 % IV SOLN
INTRAVENOUS | Status: AC
  Administered 2015-06-02: 16:00:00 via INTRAVENOUS

## 2015-06-02 MED ORDER — SODIUM CHLORIDE 0.9 % IV BOLUS (SEPSIS)
1000.0000 mL | Freq: Once | INTRAVENOUS | Status: AC
  Administered 2015-06-02: 1000 mL via INTRAVENOUS

## 2015-06-02 MED ORDER — FUROSEMIDE 40 MG PO TABS: 40.0000 mg | ORAL_TABLET | Freq: Every day | ORAL | Status: DC | PRN

## 2015-06-02 MED ORDER — PROCHLORPERAZINE MALEATE 5 MG PO TABS: 10.0000 mg | ORAL_TABLET | Freq: Four times a day (QID) | ORAL | Status: DC | PRN

## 2015-06-02 MED ORDER — IOHEXOL 350 MG/ML SOLN
120.0000 mL | Freq: Once | INTRAVENOUS | Status: AC | PRN
  Administered 2015-06-02: 150 mL via INTRAVENOUS

## 2015-06-02 MED ORDER — PIPERACILLIN-TAZOBACTAM 4.5 G IVPB: 4.5000 g | Freq: Once | INTRAVENOUS | Status: DC

## 2015-06-02 MED ORDER — METHYLPHENIDATE HCL 5 MG PO TABS
10.0000 mg | ORAL_TABLET | Freq: Every morning | ORAL | Status: DC
  Administered 2015-06-03 – 2015-06-08 (×6): 10 mg via ORAL
  Filled 2015-06-02 (×6): qty 2

## 2015-06-02 MED ORDER — RIVAROXABAN 20 MG PO TABS
20.0000 mg | ORAL_TABLET | Freq: Every day | ORAL | Status: DC
  Administered 2015-06-02 – 2015-06-08 (×7): 20 mg via ORAL
  Filled 2015-06-02 (×8): qty 1

## 2015-06-02 MED ORDER — OXYCODONE-ACETAMINOPHEN 5-325 MG PO TABS
1.0000 | ORAL_TABLET | Freq: Once | ORAL | Status: AC
  Administered 2015-06-02: 1 via ORAL
  Filled 2015-06-02: qty 1

## 2015-06-02 MED ORDER — IBUPROFEN 400 MG PO TABS
400.0000 mg | ORAL_TABLET | Freq: Four times a day (QID) | ORAL | Status: DC | PRN
  Administered 2015-06-03 – 2015-06-08 (×6): 400 mg via ORAL
  Filled 2015-06-02 (×6): qty 1

## 2015-06-02 MED ORDER — METHYLPHENIDATE HCL 5 MG PO TABS: 10.0000 mg | ORAL_TABLET | Freq: Every day | ORAL | Status: DC

## 2015-06-02 MED ORDER — METHYLPHENIDATE HCL 5 MG PO TABS
10.0000 mg | ORAL_TABLET | Freq: Every day | ORAL | Status: DC
  Filled 2015-06-02: qty 2

## 2015-06-02 MED ORDER — ZOLPIDEM TARTRATE 5 MG PO TABS: 10.0000 mg | ORAL_TABLET | Freq: Every evening | ORAL | Status: DC | PRN

## 2015-06-02 MED ORDER — DEXTROSE 5 % IV SOLN
1.0000 g | Freq: Three times a day (TID) | INTRAVENOUS | Status: DC
  Filled 2015-06-02 (×3): qty 1

## 2015-06-02 MED ORDER — PROMETHAZINE HCL 12.5 MG PO TABS: 25.0000 mg | ORAL_TABLET | Freq: Four times a day (QID) | ORAL | Status: DC | PRN

## 2015-06-02 MED ORDER — CEFEPIME HCL 1 G IJ SOLR
INTRAMUSCULAR | Status: AC
  Filled 2015-06-02 (×2): qty 1

## 2015-06-02 MED ORDER — OXYCODONE HCL 5 MG PO TABS
5.0000 mg | ORAL_TABLET | ORAL | Status: DC | PRN
  Administered 2015-06-02: 10 mg via ORAL
  Administered 2015-06-02: 5 mg via ORAL
  Administered 2015-06-03 – 2015-06-04 (×4): 10 mg via ORAL
  Administered 2015-06-06: 5 mg via ORAL
  Filled 2015-06-02: qty 1
  Filled 2015-06-02 (×4): qty 2
  Filled 2015-06-02: qty 1
  Filled 2015-06-02 (×2): qty 2

## 2015-06-02 MED ORDER — TAMOXIFEN CITRATE 10 MG PO TABS
20.0000 mg | ORAL_TABLET | Freq: Every day | ORAL | Status: DC
  Administered 2015-06-03 – 2015-06-08 (×6): 20 mg via ORAL
  Filled 2015-06-02 (×6): qty 2

## 2015-06-02 NOTE — ED Notes (Signed)
Pt back to room from CT

## 2015-06-02 NOTE — ED Notes (Signed)
Pt reports generalized cp radiating to back since yesterday, coughing up flem, h/a, sleeping a lot and emesis. Pt denies sob, denies cardiac hx.

## 2015-06-02 NOTE — ED Notes (Signed)
MD at bedside. 

## 2015-06-02 NOTE — Progress Notes (Signed)
ANTIBIOTIC CONSULT NOTE - INITIAL  Pharmacy Consult for Vancomycin & Renal adjustment antibiotics Indication: pneumonia  No Known Allergies  Patient Measurements: Height: 5\' 8"  (172.7 cm) Weight: 264 lb (119.75 kg) IBW/kg (Calculated) : 63.9 Adjusted Body Weight:   Vital Signs: Temp: 98.7 F (37.1 C) (01/22 1157) Temp Source: Oral (01/22 1157) BP: 122/73 mmHg (01/22 1602) Pulse Rate: 76 (01/22 1602) Intake/Output from previous day:   Intake/Output from this shift:    Labs:  Recent Labs  06/02/15 1210 06/02/15 1253  WBC 4.6  --   HGB 12.0 11.9*  PLT 181  --   CREATININE 0.76 0.70   Estimated Creatinine Clearance: 112.1 mL/min (by C-G formula based on Cr of 0.7). No results for input(s): VANCOTROUGH, VANCOPEAK, VANCORANDOM, GENTTROUGH, GENTPEAK, GENTRANDOM, TOBRATROUGH, TOBRAPEAK, TOBRARND, AMIKACINPEAK, AMIKACINTROU, AMIKACIN in the last 72 hours.   Microbiology: Recent Results (from the past 720 hour(s))  Blood culture (routine x 2)     Status: None (Preliminary result)   Collection Time: 06/02/15 12:43 PM  Result Value Ref Range Status   Specimen Description RIGHT ANTECUBITAL  Final   Special Requests BOTTLES DRAWN AEROBIC AND ANAEROBIC 8CC EACH  Final   Culture PENDING  Incomplete   Report Status PENDING  Incomplete  Blood culture (routine x 2)     Status: None (Preliminary result)   Collection Time: 06/02/15 12:47 PM  Result Value Ref Range Status   Specimen Description LEFT ANTECUBITAL  Final   Special Requests BOTTLES DRAWN AEROBIC AND ANAEROBIC 6CC  Final   Culture PENDING  Incomplete   Report Status PENDING  Incomplete    Medical History: Past Medical History  Diagnosis Date  . PONV (postoperative nausea and vomiting)     pt states scope patch does well  . Anemia   . Depression   . Anxiety   . Headache(784.0)     has migraines - medication controls  . Family history of prostate cancer   . Hot flashes due to tamoxifen 09/25/2014  . Breast  cancer ([place]) 04/2014    Presumed dx; stage 4 w/ mets to bone and liver, brain lesions  . Bony metastasis ([place])   . S/P small bowel resection   . Drug-induced cardiomyopathy ([place]) 02/15/2015    Medications:  Scheduled:  . [START ON 06/03/2015] methylphenidate  10 mg Oral q morning - 10a  . [START ON 06/03/2015] methylphenidate  10 mg Oral Q1200  . morphine  30 mg Oral Q12H  . pantoprazole  40 mg Oral Daily  . potassium chloride SA  20 mEq Oral BID  . rivaroxaban  20 mg Oral Q supper  . tamoxifen  20 mg Oral Daily  . [START ON 06/03/2015] venlafaxine XR  150 mg Oral Q breakfast   Assessment: 50 yo female ED patient, right sided pneumonia. Stage IV metastatic breast cancer. Last chemotherapy 10 days ago. Vancomycin 1 GM IV given in ED Cefepime 1 GM IV every 8 hours started by admitting MD Obese patient, normalized CrCl 106.8 ml/min  Goal of Therapy:  Vancomycin trough level 15-20 mcg/ml  Plan:  Additional Vancomycin 1 GM IV, then Vancomycin 1250 mg IV every 12 hours Vancomycin trough at steady state Continue Cefepime 1 GM IV every 8 hours Labs per protocol  [name], [name] 06/02/2015,5:17 PM

## 2015-06-02 NOTE — ED Notes (Signed)
Pt reports that she went on a cruise and returned Friday. Pt flew to and from departure port. Pt has hx of blood clots, pt takes Xarelto but reports that she may have missed x 2 doses while on the cruise. Pt reports CP to right breast as aching and intermittent, more with coughing. SOB on exertion. Afebrile. Pt currently receiving chemo for breast CA, last treatment 05/23/15. Pt alert and oriented X4, active, cooperative, pt in NAD. RR even and unlabored, color WNL.

## 2015-06-02 NOTE — ED Provider Notes (Signed)
CSN: [id number]     Arrival date & time 06/02/15  1144 History  By signing my name below, I, Jolayne Panther, attest that this documentation has been prepared under the direction and in the presence of Ezequiel Essex, MD. Electronically Signed: Jolayne Panther, Scribe. 06/02/2015. 1:19 PM.    Chief Complaint  Patient presents with  . Chest Pain   The history is provided by the patient. No language interpreter was used.   HPI Comments: [name] is a 50 y.o. female with a hx of blood clots in her neck and a hx of breast cancer with metastasization to her brain, liver and back who presents to the Emergency Department complaining of onset of constant chest pain/pressure with radiation into her back for the past two or three days. She also notes associated HA, nausea, lower back pain, SOB, productive cough with thick yellow sputum, emesis, pain with deep inspirations and diarrhea which has since resolved. She notes that she last received chemo ten days ago and normally receives it every three weeks. She recently returned three days ago from a cruise during which she missed a couple doses of her xarelto. She also reports that she did receive a flu shot this year. Pt used to experience migraines but no longer does. She denies fever, hx of DM, heart disease and lung disease. She does not smoke or use inhalers.  Past Medical History  Diagnosis Date  . PONV (postoperative nausea and vomiting)     pt states scope patch does well  . Anemia   . Depression   . Anxiety   . Headache(784.0)     has migraines - medication controls  . Family history of prostate cancer   . Hot flashes due to tamoxifen 09/25/2014  . Breast cancer ([place]) 04/2014    Presumed dx; stage 4 w/ mets to bone and liver, brain lesions  . Bony metastasis ([place])   . S/P small bowel resection   . Drug-induced cardiomyopathy ([place]) 02/15/2015   Past Surgical History  Procedure Laterality Date  . Right rotator cuff      2002   . Neck fusion      2003  . Laparoscopic cholecystectomy      2004  . Right knee arthroscopy      2005  . Appendectomy      2008  . Abdominal hysterectomy    . Breast reduction surgery  03/17/2011    Procedure: MAMMARY REDUCTION BILATERAL (BREAST);  Surgeon: Mary A Contogiannis;  Location: [facility];  Service: Plastics;  Laterality: Bilateral;  . Colonoscopy N/A 07/13/2013    Procedure: COLONOSCOPY;  Surgeon: Rogene Houston, MD;  [facility];  Service: Endoscopy;  Laterality: N/A;  930  . Liver biopsy  04/2014  . Portacath placement    . Esophagogastroduodenoscopy N/A 05/25/2014    Procedure: ESOPHAGOGASTRODUODENOSCOPY (EGD);  Surgeon: Rogene Houston, MD;  [facility];  Service: Endoscopy;  Laterality: N/A;  155  . Laparoscopic appendectomy    . Colonoscopy N/A 11/26/2014    Procedure: COLONOSCOPY;  Surgeon: Rogene Houston, MD;  [facility];  Service: Endoscopy;  Laterality: N/A;  730   Family History  Problem Relation Age of Onset  . Diabetes Father   . Heart attack Maternal Grandmother 30    multiple over lifetime.  . Cancer Maternal Grandmother 1    NOS  . Prostate cancer Maternal Grandfather     dx in his  60s  . Lung cancer Paternal Grandfather     dx <50  . Lymphoma Maternal Aunt     dx in her 53s  . Melanoma Cousin 43    maternal first cousin  . Brain cancer Cousin     paternal first cousin dx under 41  . Prostate cancer Other     MGF's father  . Colon cancer Other     MGM's mother   Social History  Substance Use Topics  . Smoking status: Former Smoker    Types: Cigarettes    Quit date: 08/20/1994  . Smokeless tobacco: Never Used  . Alcohol Use: Yes     Comment: Occasionally   OB History    No data available     Review of Systems  A complete 10 system review of systems was obtained and all systems are negative except as noted in the HPI and PMH.   Allergies  Review of patient's allergies indicates  no known allergies.  Home Medications   Prior to Admission medications   Medication Sig Start Date End Date Taking? Authorizing Provider  Calcium-Phosphorus-Vitamin D (CALCIUM/D3 ADULT GUMMIES PO) Take by mouth. Patient taking 4 gummies a day to equal her dosage of calcium 1200mg  and vitamin d 1000mg    Yes Historical Provider, MD  denosumab (XGEVA) 120 MG/1.7ML SOLN injection Inject 120 mg into the skin once.   Yes Historical Provider, MD  diphenhydrAMINE (BENADRYL) 25 mg capsule Take 25 mg by mouth every 6 (six) hours as needed for itching.   Yes Historical Provider, MD  furosemide (LASIX) 20 MG tablet Take 2 tablets (40 mg total) by mouth daily as needed. 05/22/15  Yes Manon Hilding Kefalas, PA-C  ibuprofen (ADVIL,MOTRIN) 200 MG tablet Take 400 mg by mouth every 6 (six) hours as needed.   Yes Historical Provider, MD  lidocaine-prilocaine (EMLA) cream Apply 1 application topically daily as needed (apply to port before chemo).  05/17/14  Yes Historical Provider, MD  LORazepam (ATIVAN) 0.5 MG tablet Take 0.5 mg by mouth every 6 (six) hours as needed for anxiety.   Yes Historical Provider, MD  meclizine (ANTIVERT) 25 MG tablet Take 1 tablet (25 mg total) by mouth every 6 (six) hours as needed for dizziness. 12/25/14  Yes Patrici Ranks, MD  methylphenidate (RITALIN) 10 MG tablet Take 2 in the morning and 1 at lunch 05/10/15  Yes Patrici Ranks, MD  morphine (MS CONTIN) 30 MG 12 hr tablet Take 1 tablet (30 mg total) by mouth every 12 (twelve) hours. 05/03/15  Yes Manon Hilding Kefalas, PA-C  ondansetron (ZOFRAN) 8 MG tablet Take 1 tablet (8 mg total) by mouth every 8 (eight) hours as needed for nausea or vomiting. 08/14/14  Yes Patrici Ranks, MD  oxyCODONE (OXY IR/ROXICODONE) 5 MG immediate release tablet Take 1-2 tablets (5-10 mg total) by mouth every 4 (four) hours as needed. Patient taking differently: Take 5-10 mg by mouth every 4 (four) hours as needed for moderate pain.  09/14/14  Yes Baird Cancer,  PA-C  pantoprazole (PROTONIX) 40 MG tablet TAKE 1 TABLET BY MOUTH EVERY DAY 01/31/15  Yes Manon Hilding Kefalas, PA-C  potassium chloride SA (K-DUR,KLOR-CON) 20 MEQ tablet Take 1 tablet (20 mEq total) by mouth 2 (two) times daily. 05/03/15  Yes Baird Cancer, PA-C  prochlorperazine (COMPAZINE) 10 MG tablet Take 1 tablet (10 mg total) by mouth every 6 (six) hours as needed for nausea or vomiting. 07/05/14  Yes Patrici Ranks, MD  promethazine (PHENERGAN) 25 MG tablet Take 1 tablet (25 mg total) by mouth every 6 (six) hours as needed for nausea or vomiting. 12/25/14  Yes Patrici Ranks, MD  rivaroxaban (XARELTO) 20 MG TABS tablet Take 1 tablet (20 mg total) by mouth daily with supper. 11/06/14  Yes Patrici Ranks, MD  SUMAtriptan (IMITREX) 100 MG tablet Take 1 tablet (100 mg total) by mouth as needed. foir migraine 07/03/14  Yes Patrici Ranks, MD  tamoxifen (NOLVADEX) 20 MG tablet Take 1 tablet (20 mg total) by mouth daily. 11/06/14  Yes Patrici Ranks, MD  Trastuzumab (HERCEPTIN IV) Inject into the vein.   Yes Historical Provider, MD  venlafaxine XR (EFFEXOR-XR) 75 MG 24 hr capsule TAKE 2 CAPSULES BY MOUTH DAILY WITH BREAKFAST 03/01/15  Yes Manon Hilding Kefalas, PA-C  zolpidem (AMBIEN) 10 MG tablet Take 10 mg by mouth at bedtime as needed. 03/10/15  Yes Historical Provider, MD  ciprofloxacin (CIPRO) 500 MG tablet Take 1 tablet (500 mg total) by mouth 2 (two) times daily. Patient not taking: Reported on 06/02/2015 05/10/15   Patrici Ranks, MD  dexamethasone (DECADRON) 4 MG tablet Take 1 tablet (4 mg total) by mouth daily. Patient not taking: Reported on 04/03/2015 01/08/15   Baird Cancer, PA-C  scopolamine (TRANSDERM-SCOP) 1 MG/3DAYS Place 1 patch (1.5 mg total) onto the skin every 3 (three) days. Patient not taking: Reported on 06/02/2015 05/03/15   Manon Hilding Kefalas, PA-C   BP 141/68 mmHg  Pulse 79  Temp(Src) 98.5 F (36.9 C) (Oral)  Resp 15  Ht 5\' 8"  (1.727 m)  Wt 263 lb (119.296 kg)   BMI 40.00 kg/m2  SpO2 95% Physical Exam  Constitutional: She is oriented to person, place, and time. She appears well-developed and well-nourished. No distress.  HENT:  Head: Normocephalic and atraumatic.  Mouth/Throat: Oropharynx is clear and moist. No oropharyngeal exudate.  Dry mucous membranes Moist cough    Eyes: Conjunctivae and EOM are normal. Pupils are equal, round, and reactive to light.  Neck: Normal range of motion. Neck supple.  No meningismus.  Cardiovascular: Normal rate, regular rhythm, normal heart sounds and intact distal pulses.   No murmur heard. Pulmonary/Chest: Effort normal.  Decreased breath sounds at the right base Chest wall tenderness   Abdominal: Soft. There is no tenderness. There is no rebound and no guarding.  Musculoskeletal: Normal range of motion. She exhibits no edema or tenderness.  +2 pedal edema bilaterally   Neurological: She is alert and oriented to person, place, and time. No cranial nerve deficit. She exhibits normal muscle tone. Coordination normal.  No ataxia on finger to nose bilaterally. No pronator drift. 5/5 strength throughout. CN 2-12 intact.Equal grip strength. Sensation intact.   Skin: Skin is warm.  Psychiatric: She has a normal mood and affect. Her behavior is normal.  Nursing note and vitals reviewed.   ED Course  Procedures  DIAGNOSTIC STUDIES:    Oxygen Saturation is 93% on RA, low by my interpretation.   COORDINATION OF CARE:  12:28 PM Will review x ray. Will order blood work and CT scan. Discussed treatment plan with pt at bedside and pt agreed to plan.   Labs Review Labs Reviewed  BASIC METABOLIC PANEL - Abnormal; Notable for the following:    Glucose, Bld 121 (*)    BUN 5 (*)    Calcium 8.8 (*)    All other components within normal limits  HEPATIC FUNCTION PANEL - Abnormal; Notable for the following:  Total Protein 5.7 (*)    Albumin 3.4 (*)    Alkaline Phosphatase 32 (*)    Indirect Bilirubin 0.2 (*)     All other components within normal limits  I-STAT CHEM 8, ED - Abnormal; Notable for the following:    Potassium 3.3 (*)    BUN <3 (*)    Glucose, Bld 114 (*)    Hemoglobin 11.9 (*)    HCT 35.0 (*)    All other components within normal limits  CULTURE, BLOOD (ROUTINE X 2)  CULTURE, BLOOD (ROUTINE X 2)  CULTURE, EXPECTORATED SPUTUM-ASSESSMENT  GRAM STAIN  CBC  TROPONIN I  HIV ANTIBODY (ROUTINE TESTING)  STREP PNEUMONIAE URINARY ANTIGEN  COMPREHENSIVE METABOLIC PANEL  CBC  I-STAT CG4 LACTIC ACID, ED  I-STAT CG4 LACTIC ACID, ED    Imaging Review Dg Chest 2 View  06/02/2015  CLINICAL DATA:  Chest pain, breast cancer on chemotherapy EXAM: CHEST  2 VIEW COMPARISON:  03/29/2015 FINDINGS: Cardiomediastinal silhouette is stable. No acute infiltrate or pleural effusion. No pulmonary edema. There is right IJ Port-A-Cath with tip in SVC. No pneumothorax. Mild infrahilar bronchitic changes. Bony thorax is unremarkable. IMPRESSION: No infiltrate or pulmonary edema. Mild infrahilar bronchitic changes. Electronically Signed   By: Lahoma Crocker M.D.   On: 06/02/2015 13:14   Ct Head Wo Contrast  06/02/2015  CLINICAL DATA:  Headache and history of breast carcinoma with treated skull base and intracranial metastatic lesions. EXAM: CT HEAD WITHOUT CONTRAST TECHNIQUE: Contiguous axial images were obtained from the base of the skull through the vertex without intravenous contrast. COMPARISON:  MRI of the brain on 03/29/2015 and head CT on 03/22/2015. FINDINGS: By unenhanced CT, the only visualized metastatic lesion is a stable 7 mm sclerotic lesion at the skull base within the left clivus. Previously visualized cerebral metastases have been shown to have responded to radiation therapy. These are nonvisualized by unenhanced CT and there is no evidence of acute hemorrhage, visible infarction, mass effect or hydrocephalus. No extra-axial fluid collections are identified. IMPRESSION: No acute findings. The only  visible metastatic lesion by unenhanced CT is a stable 7 mm sclerotic lesion in the left clivus. Electronically Signed   By: Aletta Edouard M.D.   On: 06/02/2015 15:11   Ct Angio Chest Pe W/cm &/or Wo Cm  06/02/2015  CLINICAL DATA:  Right side chest pain, history of breast cancer EXAM: CT ANGIOGRAPHY CHEST WITH CONTRAST TECHNIQUE: Multidetector CT imaging of the chest was performed using the standard protocol during bolus administration of intravenous contrast. Multiplanar CT image reconstructions and MIPs were obtained to evaluate the vascular anatomy. CONTRAST:  196mL OMNIPAQUE IOHEXOL 350 MG/ML SOLN COMPARISON:  03/29/2015 FINDINGS: Sagittal images of the spine shows again small sclerotic bone lesions within thoracic spine and sternum consistent with treated metastasis. The visualized upper abdomen shows fatty infiltration of the liver. Stable low-density lesion in right hepatic lobe posteriorly measures 3.7 cm consistent with stable metastatic disease. Images of the thoracic inlet are unremarkable. Central airways are patent. A precarinal lymph node measures 8 mm not pathologic by size criteria. No pulmonary embolus is noted. There is no pericardial effusion. Heart size within normal limits. There is patchy infiltrate in right lower lobe superior segment and basal segment. Findings are consistent with early pneumonia. Stable mild nodular contour of the liver. Stable low-density lesion within spleen. There is no pulmonary edema. Review of the MIP images confirms the above findings. IMPRESSION: 1. No pulmonary embolus is noted. 2. There is patchy  infiltrate/pneumonia right lower lobe. 3. Again noted fatty infiltration of the liver and subtle nodular liver contour. Stable metastatic lesion in right hepatic lobe posteriorly measures 3.7 cm. 4. Stable small sclerotic bone lesions. 5. No pulmonary edema. 6. These results were called by telephone at the time of interpretation on 06/02/2015 at 3:19 pm to Dr. Ezequiel Essex , who verbally acknowledged these results. Electronically Signed   By: Lahoma Crocker M.D.   On: 06/02/2015 15:20   I have personally reviewed and evaluated these images and lab results as part of my medical decision-making.   EKG Interpretation None      MDM   Final diagnoses:  HCAP (healthcare-associated pneumonia)  Metastatic breast cancer ([place])   Patient with stage IV breast cancer on chemotherapy presenting with constant ongoing chest pain for the past 3 days, productive cough, headache, episode of vomiting and increased fatigue. Returned from a cruise 2 days ago. May have missed 2-3 doses of her Xarelto. Chest pain worse with coughing and is nonexertional.  EKG is nonischemic. Labs appear to be at baseline. Troponin is negative. Chest x-ray shows possible early basilar infiltrate though radiology does not agree.  CT obtained as patient recently went on a cruise and may have missed several doses of her xarelto. There is no evidence of pulmonary embolism but there is right lower lobe pneumonia.  She'll be treated for healthcare associated pneumonia. Blood cultures obtained. She is stable with no oxygen requirement.  Observation admission d/w Dr. Anastasio Champion  ED ECG REPORT   Date: 06/02/2015  Rate: 97  Rhythm: normal sinus rhythm  QRS Axis: normal  Intervals: normal  ST/T Wave abnormalities: nonspecific ST/T changes  Conduction Disutrbances:none  Narrative Interpretation: septal T waves now upright  Old EKG Reviewed: changes noted  I have personally reviewed the EKG tracing and agree with the computerized printout as noted.   I personally performed the services described in this documentation, which was scribed in my presence. The recorded information has been reviewed and is accurate.    Ezequiel Essex, MD 06/02/15 1816

## 2015-06-02 NOTE — ED Notes (Signed)
Pt up and assisted to the bathroom. Pt ambulates with ease.

## 2015-06-02 NOTE — H&P (Signed)
[facility] History and Physical  [name] [id number] DOB: 1966-02-26 DOA: 06/02/2015  Referring physician: ER PCP: [name], MD   Chief Complaint: Cough, chest pain  HPI: [name] is a 50 y.o. female  This is a 50 year old lady who has stage IV metastatic breast cancer and who just returned from a cruise in the [place] 2 days ago. She now presents with a 24-hour history of cough productive of green sputum associated with chest pain which radiates to her back. She denies objective fever but feels hot/cold. Prior to this, she has had some diarrhea. Her last chemotherapy was 10 days ago. She is a nonsmoker. She is on chronic anticoagulation therapy. Evaluation in the emergency room shows it to have right-sided pneumonia. She is now being admitted for further management.   Review of Systems:  Apart from symptoms above, all systems are negative.  Past Medical History  Diagnosis Date  . PONV (postoperative nausea and vomiting)     pt states scope patch does well  . Anemia   . Depression   . Anxiety   . Headache(784.0)     has migraines - medication controls  . Family history of prostate cancer   . Hot flashes due to tamoxifen 09/25/2014  . Breast cancer ([place]) 04/2014    Presumed dx; stage 4 w/ mets to bone and liver, brain lesions  . Bony metastasis ([place])   . S/P small bowel resection   . Drug-induced cardiomyopathy ([place]) 02/15/2015   Past Surgical History  Procedure Laterality Date  . Right rotator cuff      2002  . Neck fusion      2003  . Laparoscopic cholecystectomy      2004  . Right knee arthroscopy      2005  . Appendectomy      2008  . Abdominal hysterectomy    . Breast reduction surgery  03/17/2011    Procedure: MAMMARY REDUCTION BILATERAL (BREAST);  Surgeon: Mary A Contogiannis;  Location: [facility];  Service: Plastics;  Laterality: Bilateral;  . Colonoscopy N/A 07/13/2013    Procedure: COLONOSCOPY;  Surgeon: Rogene Houston, MD;  [facility];  Service: Endoscopy;  Laterality: N/A;  930  . Liver biopsy  04/2014  . Portacath placement    . Esophagogastroduodenoscopy N/A 05/25/2014    Procedure: ESOPHAGOGASTRODUODENOSCOPY (EGD);  Surgeon: Rogene Houston, MD;  [facility];  Service: Endoscopy;  Laterality: N/A;  155  . Laparoscopic appendectomy    . Colonoscopy N/A 11/26/2014    Procedure: COLONOSCOPY;  Surgeon: Rogene Houston, MD;  [facility];  Service: Endoscopy;  Laterality: N/A;  730   Social History:  reports that she quit smoking about 20 years ago. Her smoking use included Cigarettes. She has never used smokeless tobacco. She reports that she drinks alcohol. She reports that she does not use illicit drugs.  No Known Allergies  Family History  Problem Relation Age of Onset  . Diabetes Father   . Heart attack Maternal Grandmother 30    multiple over lifetime.  . Cancer Maternal Grandmother 4    NOS  . Prostate cancer Maternal Grandfather     dx in his 80s  . Lung cancer Paternal Grandfather     dx <50  . Lymphoma Maternal Aunt     dx in her 72s  . Melanoma Cousin 25    maternal first cousin  . Brain cancer Cousin  paternal first cousin dx under 28  . Prostate cancer Other     MGF's father  . Colon cancer Other     MGM's mother      Prior to Admission medications   Medication Sig Start Date End Date Taking? Authorizing Provider  Calcium-Phosphorus-Vitamin D (CALCIUM/D3 ADULT GUMMIES PO) Take by mouth. Patient taking 4 gummies a day to equal her dosage of calcium 1200mg  and vitamin d 1000mg    Yes Historical Provider, MD  denosumab (XGEVA) 120 MG/1.7ML SOLN injection Inject 120 mg into the skin once.   Yes Historical Provider, MD  diphenhydrAMINE (BENADRYL) 25 mg capsule Take 25 mg by mouth every 6 (six) hours as needed for itching.   Yes Historical Provider, MD  furosemide (LASIX) 20 MG tablet Take 2 tablets (40 mg total) by mouth daily as needed.  05/22/15  Yes Manon Hilding Kefalas, PA-C  ibuprofen (ADVIL,MOTRIN) 200 MG tablet Take 400 mg by mouth every 6 (six) hours as needed.   Yes Historical Provider, MD  lidocaine-prilocaine (EMLA) cream Apply 1 application topically daily as needed (apply to port before chemo).  05/17/14  Yes Historical Provider, MD  LORazepam (ATIVAN) 0.5 MG tablet Take 0.5 mg by mouth every 6 (six) hours as needed for anxiety.   Yes Historical Provider, MD  meclizine (ANTIVERT) 25 MG tablet Take 1 tablet (25 mg total) by mouth every 6 (six) hours as needed for dizziness. 12/25/14  Yes Patrici Ranks, MD  methylphenidate (RITALIN) 10 MG tablet Take 2 in the morning and 1 at lunch 05/10/15  Yes Patrici Ranks, MD  morphine (MS CONTIN) 30 MG 12 hr tablet Take 1 tablet (30 mg total) by mouth every 12 (twelve) hours. 05/03/15  Yes Manon Hilding Kefalas, PA-C  ondansetron (ZOFRAN) 8 MG tablet Take 1 tablet (8 mg total) by mouth every 8 (eight) hours as needed for nausea or vomiting. 08/14/14  Yes Patrici Ranks, MD  oxyCODONE (OXY IR/ROXICODONE) 5 MG immediate release tablet Take 1-2 tablets (5-10 mg total) by mouth every 4 (four) hours as needed. Patient taking differently: Take 5-10 mg by mouth every 4 (four) hours as needed for moderate pain.  09/14/14  Yes Baird Cancer, PA-C  pantoprazole (PROTONIX) 40 MG tablet TAKE 1 TABLET BY MOUTH EVERY DAY 01/31/15  Yes Manon Hilding Kefalas, PA-C  potassium chloride SA (K-DUR,KLOR-CON) 20 MEQ tablet Take 1 tablet (20 mEq total) by mouth 2 (two) times daily. 05/03/15  Yes Baird Cancer, PA-C  prochlorperazine (COMPAZINE) 10 MG tablet Take 1 tablet (10 mg total) by mouth every 6 (six) hours as needed for nausea or vomiting. 07/05/14  Yes Patrici Ranks, MD  promethazine (PHENERGAN) 25 MG tablet Take 1 tablet (25 mg total) by mouth every 6 (six) hours as needed for nausea or vomiting. 12/25/14  Yes Patrici Ranks, MD  rivaroxaban (XARELTO) 20 MG TABS tablet Take 1 tablet (20 mg total) by  mouth daily with supper. 11/06/14  Yes Patrici Ranks, MD  SUMAtriptan (IMITREX) 100 MG tablet Take 1 tablet (100 mg total) by mouth as needed. foir migraine 07/03/14  Yes Patrici Ranks, MD  tamoxifen (NOLVADEX) 20 MG tablet Take 1 tablet (20 mg total) by mouth daily. 11/06/14  Yes Patrici Ranks, MD  Trastuzumab (HERCEPTIN IV) Inject into the vein.   Yes Historical Provider, MD  venlafaxine XR (EFFEXOR-XR) 75 MG 24 hr capsule TAKE 2 CAPSULES BY MOUTH DAILY WITH BREAKFAST 03/01/15  Yes Baird Cancer, PA-C  zolpidem (AMBIEN) 10 MG tablet Take 10 mg by mouth at bedtime as needed. 03/10/15  Yes Historical Provider, MD  ciprofloxacin (CIPRO) 500 MG tablet Take 1 tablet (500 mg total) by mouth 2 (two) times daily. Patient not taking: Reported on 06/02/2015 05/10/15   Patrici Ranks, MD  dexamethasone (DECADRON) 4 MG tablet Take 1 tablet (4 mg total) by mouth daily. Patient not taking: Reported on 04/03/2015 01/08/15   Baird Cancer, PA-C  scopolamine (TRANSDERM-SCOP) 1 MG/3DAYS Place 1 patch (1.5 mg total) onto the skin every 3 (three) days. Patient not taking: Reported on 06/02/2015 05/03/15   Baird Cancer, PA-C   Physical Exam: Filed Vitals:   06/02/15 1349 06/02/15 1350 06/02/15 1400 06/02/15 1602  BP: 126/62  118/63 122/73  Pulse:  84 75 76  Temp:      TempSrc:      Resp: 31 18 10 14   Height:      Weight:      SpO2:  95% 96% 96%    Wt Readings from Last 3 Encounters:  06/02/15 119.75 kg (264 lb)  05/23/15 117.935 kg (260 lb)  05/10/15 121.11 kg (267 lb)    General:  Appears calm and comfortable. She is not toxic or septic likely. She is afebrile. Eyes: PERRL, normal lids, irises & conjunctiva ENT: grossly normal hearing, lips & tongue Neck: no LAD, masses or thyromegaly Cardiovascular: RRR, no m/r/g. No LE edema. Telemetry: SR, no arrhythmias  Respiratory: She does have some crackles in the right mid zone. There is no bronchial breathing or wheezing. Abdomen:  soft, ntnd Skin: no rash or induration seen on limited exam Musculoskeletal: grossly normal tone BUE/BLE Psychiatric: grossly normal mood and affect, speech fluent and appropriate Neurologic: grossly non-focal.          Labs on Admission:  Basic Metabolic Panel:  Recent Labs Lab 06/02/15 1210 06/02/15 1253  NA 141 142  K 3.5 3.3*  CL 106 101  CO2 26  --   GLUCOSE 121* 114*  BUN 5* <3*  CREATININE 0.76 0.70  CALCIUM 8.8*  --    Liver Function Tests:  Recent Labs Lab 06/02/15 1210  AST 30  ALT 25  ALKPHOS 32*  BILITOT 0.3  PROT 5.7*  ALBUMIN 3.4*   No results for input(s): LIPASE, AMYLASE in the last 168 hours. No results for input(s): AMMONIA in the last 168 hours. CBC:  Recent Labs Lab 06/02/15 1210 06/02/15 1253  WBC 4.6  --   HGB 12.0 11.9*  HCT 36.5 35.0*  MCV 93.8  --   PLT 181  --    Cardiac Enzymes:  Recent Labs Lab 06/02/15 1210  TROPONINI 0.03    BNP (last 3 results) No results for input(s): BNP in the last 8760 hours.  ProBNP (last 3 results) No results for input(s): PROBNP in the last 8760 hours.  CBG: No results for input(s): GLUCAP in the last 168 hours.  Radiological Exams on Admission: Dg Chest 2 View  06/02/2015  CLINICAL DATA:  Chest pain, breast cancer on chemotherapy EXAM: CHEST  2 VIEW COMPARISON:  03/29/2015 FINDINGS: Cardiomediastinal silhouette is stable. No acute infiltrate or pleural effusion. No pulmonary edema. There is right IJ Port-A-Cath with tip in SVC. No pneumothorax. Mild infrahilar bronchitic changes. Bony thorax is unremarkable. IMPRESSION: No infiltrate or pulmonary edema. Mild infrahilar bronchitic changes. Electronically Signed   By: Lahoma Crocker M.D.   On: 06/02/2015 13:14   Ct Head Wo Contrast  06/02/2015  CLINICAL DATA:  Headache and history of breast carcinoma with treated skull base and intracranial metastatic lesions. EXAM: CT HEAD WITHOUT CONTRAST TECHNIQUE: Contiguous axial images were obtained from  the base of the skull through the vertex without intravenous contrast. COMPARISON:  MRI of the brain on 03/29/2015 and head CT on 03/22/2015. FINDINGS: By unenhanced CT, the only visualized metastatic lesion is a stable 7 mm sclerotic lesion at the skull base within the left clivus. Previously visualized cerebral metastases have been shown to have responded to radiation therapy. These are nonvisualized by unenhanced CT and there is no evidence of acute hemorrhage, visible infarction, mass effect or hydrocephalus. No extra-axial fluid collections are identified. IMPRESSION: No acute findings. The only visible metastatic lesion by unenhanced CT is a stable 7 mm sclerotic lesion in the left clivus. Electronically Signed   By: Aletta Edouard M.D.   On: 06/02/2015 15:11   Ct Angio Chest Pe W/cm &/or Wo Cm  06/02/2015  CLINICAL DATA:  Right side chest pain, history of breast cancer EXAM: CT ANGIOGRAPHY CHEST WITH CONTRAST TECHNIQUE: Multidetector CT imaging of the chest was performed using the standard protocol during bolus administration of intravenous contrast. Multiplanar CT image reconstructions and MIPs were obtained to evaluate the vascular anatomy. CONTRAST:  148mL OMNIPAQUE IOHEXOL 350 MG/ML SOLN COMPARISON:  03/29/2015 FINDINGS: Sagittal images of the spine shows again small sclerotic bone lesions within thoracic spine and sternum consistent with treated metastasis. The visualized upper abdomen shows fatty infiltration of the liver. Stable low-density lesion in right hepatic lobe posteriorly measures 3.7 cm consistent with stable metastatic disease. Images of the thoracic inlet are unremarkable. Central airways are patent. A precarinal lymph node measures 8 mm not pathologic by size criteria. No pulmonary embolus is noted. There is no pericardial effusion. Heart size within normal limits. There is patchy infiltrate in right lower lobe superior segment and basal segment. Findings are consistent with early  pneumonia. Stable mild nodular contour of the liver. Stable low-density lesion within spleen. There is no pulmonary edema. Review of the MIP images confirms the above findings. IMPRESSION: 1. No pulmonary embolus is noted. 2. There is patchy infiltrate/pneumonia right lower lobe. 3. Again noted fatty infiltration of the liver and subtle nodular liver contour. Stable metastatic lesion in right hepatic lobe posteriorly measures 3.7 cm. 4. Stable small sclerotic bone lesions. 5. No pulmonary edema. 6. These results were called by telephone at the time of interpretation on 06/02/2015 at 3:19 pm to Dr. Ezequiel Essex , who verbally acknowledged these results. Electronically Signed   By: Lahoma Crocker M.D.   On: 06/02/2015 15:20      Assessment/Plan   1. Right lower lobe pneumonia. She will be treated with broad-spectrum antibiotics in view of her immunocompromised state. Fortunately, she does not have an oxygen requirement. 2. Stage IV breast cancer. This appears to be stable and CT scan does not show any new lesions.  She'll be admitted to the medical floor. Further recommendations will depend on patient's hospital progress.   Code Status: Full code.  Family Communication: I discussed the plan with the patient and patient's husband at the bedside.  Disposition Plan: Home when medically stable, probably in the next 36-48 hours.   Time spent: 60 minutes.  Doree Albee [facility] Pager [phone].

## 2015-06-02 NOTE — ED Notes (Signed)
Pt up to bathroom.

## 2015-06-02 NOTE — ED Notes (Signed)
Pt back into bed at this time. Awaiting CT.

## 2015-06-03 ENCOUNTER — Other Ambulatory Visit (HOSPITAL_COMMUNITY): Payer: Self-pay | Admitting: Oncology

## 2015-06-03 DIAGNOSIS — C799 Secondary malignant neoplasm of unspecified site: Secondary | ICD-10-CM

## 2015-06-03 DIAGNOSIS — C50919 Malignant neoplasm of unspecified site of unspecified female breast: Secondary | ICD-10-CM | POA: Insufficient documentation

## 2015-06-03 LAB — COMPREHENSIVE METABOLIC PANEL
ALT: 21 U/L (ref 14–54)
ANION GAP: 6 (ref 5–15)
AST: 24 U/L (ref 15–41)
Albumin: 2.9 g/dL — ABNORMAL LOW (ref 3.5–5.0)
Alkaline Phosphatase: 30 U/L — ABNORMAL LOW (ref 38–126)
BUN: <5 mg/dL — ABNORMAL LOW (ref 6–20)
CHLORIDE: 105 mmol/L (ref 101–111)
CO2: 27 mmol/L (ref 22–32)
Calcium: 7.9 mg/dL — ABNORMAL LOW (ref 8.9–10.3)
Creatinine, Ser: 0.71 mg/dL (ref 0.44–1.00)
Glucose, Bld: 106 mg/dL — ABNORMAL HIGH (ref 65–99)
POTASSIUM: 3.2 mmol/L — AB (ref 3.5–5.1)
Sodium: 138 mmol/L (ref 135–145)
Total Bilirubin: 0.4 mg/dL (ref 0.3–1.2)
Total Protein: 5.2 g/dL — ABNORMAL LOW (ref 6.5–8.1)

## 2015-06-03 LAB — CBC
HEMATOCRIT: 31.2 % — AB (ref 36.0–46.0)
HEMOGLOBIN: 10.4 g/dL — AB (ref 12.0–15.0)
MCH: 31.4 pg (ref 26.0–34.0)
MCHC: 33.3 g/dL (ref 30.0–36.0)
MCV: 94.3 fL (ref 78.0–100.0)
Platelets: 149 10*3/uL — ABNORMAL LOW (ref 150–400)
RBC: 3.31 MIL/uL — AB (ref 3.87–5.11)
RDW: 12.9 % (ref 11.5–15.5)
WBC: 3.1 10*3/uL — AB (ref 4.0–10.5)

## 2015-06-03 LAB — HIV ANTIBODY (ROUTINE TESTING W REFLEX): HIV SCREEN 4TH GENERATION: NONREACTIVE

## 2015-06-03 MED ORDER — DEXTROSE 5 % IV SOLN
2.0000 g | INTRAVENOUS | Status: DC
  Administered 2015-06-03 – 2015-06-08 (×6): 2 g via INTRAVENOUS
  Filled 2015-06-03 (×8): qty 2

## 2015-06-03 MED ORDER — ALBUTEROL SULFATE (2.5 MG/3ML) 0.083% IN NEBU
2.5000 mg | INHALATION_SOLUTION | Freq: Four times a day (QID) | RESPIRATORY_TRACT | Status: DC | PRN
  Administered 2015-06-03 – 2015-06-04 (×2): 2.5 mg via RESPIRATORY_TRACT
  Filled 2015-06-03: qty 3

## 2015-06-03 NOTE — Care Management (Signed)
Discussed case with Dr Reynaldo Minium and Dr Sarajane Jews. Change to observation.

## 2015-06-03 NOTE — Progress Notes (Signed)
Pt ambulated in hallway twice today, both times over 250 feet without complaints.

## 2015-06-03 NOTE — Care Management Note (Signed)
Case Management Note  Patient Details  Name: [name] MRN: [id number] Date of Birth: 1965/06/07  Subjective/Objective:                 Spoke with patient concerning discharge plan patient is from home with husband alert and oriented. Drives self no DME or O2   Action/Plan:     Home with self care.   Expected Discharge Date:                  Expected Discharge Plan:  Home/Self Care  In-House Referral:     Discharge planning Services  CM Consult  Post Acute Care Choice:    Choice offered to:     DME Arranged:    DME Agency:     HH Arranged:    [place] Agency:     Status of Service:  Completed, signed off  Medicare Important Message Given:    Date Medicare IM Given:    Medicare IM give by:    Date Additional Medicare IM Given:    Additional Medicare Important Message give by:     If discussed at [place] of Stay Meetings, dates discussed:    Additional Comments:  Alvie Heidelberg, RN 06/03/2015, 9:55 AM

## 2015-06-03 NOTE — Progress Notes (Signed)
Nutrition Brief Note  Patient identified on the Malnutrition Screening Tool (MST) Report She has a hx of stage IV metastatic breast cancer and is being treated for pneumonia. Expecting d/c 1-2 days if no complications arise per MD.  Wt Readings from Last 15 Encounters:  06/02/15 263 lb (119.296 kg)  05/23/15 260 lb (117.935 kg)  05/10/15 267 lb (121.11 kg)  05/03/15 261 lb 12.8 oz (118.752 kg)  04/17/15 273 lb (123.832 kg)  04/12/15 270 lb 3.2 oz (122.562 kg)  04/03/15 272 lb 3.2 oz (123.469 kg)  03/29/15 268 lb (121.564 kg)  03/15/15 268 lb (121.564 kg)  02/28/15 265 lb 8 oz (120.43 kg)  02/15/15 271 lb 3.2 oz (123.016 kg)  02/15/15 271 lb (122.925 kg)  01/29/15 264 lb 6.4 oz (119.931 kg)  01/08/15 252 lb (114.306 kg)  12/26/14 255 lb 12.8 oz (116.03 kg)    Body mass index is 40 kg/(m^2). Patient meets criteria for obesity class III based on current BMI. Pt has hx of weight gain 22# over the past year.  Current diet order is regular, patient meal intake is not documented. Labs and medications reviewed.   No nutrition interventions warranted at this time. If nutrition issues arise, please consult RD.   Colman Cater MS,RD,CSG,LDN Office: [phone] Pager: [phone]

## 2015-06-03 NOTE — Progress Notes (Signed)
[facility] PROGRESS NOTE  [name] [id number] DOB: 08/29/1965 DOA: 06/02/2015 PCP: [name], MD  Assessment/Plan: 1. Right lower lobe pneumonia in immunocompromised patient 2/2 chemotherapy for Stage IV metastatic breast cancer: patient on broad spectrum antibiotics which include Cefepime and vancomycin to cover for Gram +, Gram - and Pseudomonas.  Patient feeling slightly better today.  Will continue antibiotics and gentle IV hydration.  Monitor vitals closely 2. Stage IV metastatic breast cancer: continue patient on tamoxifen.  She requests that her physician Dr. Wanita Chamberlain is notified. No new lesions seen on CT  Code Status: Full Family Communication: no family at bedside (indicate person spoken with, relationship, and if by phone, the number) Disposition Plan: home in 1-2 days pending respiratory status   Consultants:  none  Procedures:  none  Antibiotics:  Zosyn- 1 dose 06/02/15  Vancomycin pharmacy dosing q12h started 06/02/15  Cefepime 1g q8h started 06/02/15   HPI/Subjective: This is a 50 year old lady who has stage IV metastatic breast cancer and who just returned from a cruise in the [place] 2 days ago. She now presents with a 24-hour history of cough productive of green sputum associated with chest pain which radiates to her back. She denies objective fever but feels hot/cold. Prior to this, she has had some diarrhea. Her last chemotherapy was 10 days ago. She is a nonsmoker. She is on chronic anticoagulation therapy. Evaluation in the emergency room shows it to have right-sided pneumonia. She was admitted for further management.  She was initially treated with Vancomycin and Zosyn in the ED but was switched to Cefepime and Vancomycin at time of admission.  She is doing well this morning and reports some improvement since admission.  She denies any chest pain, chest pressure, increased work of breathing, orthopnea.  She does states she is coughing slightly  more since admission.  Denies fevers.  Patient requests that her oncology physician be notified that she is admitted. She otherwise voices no concerns.   Objective: Filed Vitals:   06/03/15 0456 06/03/15 0800  BP: 116/63 142/68  Pulse: 85 85  Temp: 97.9 F (36.6 C) 98.8 F (37.1 C)  Resp: 16 18    Intake/Output Summary (Last 24 hours) at 06/03/15 0903 Last data filed at 06/03/15 0457  Gross per 24 hour  Intake     50 ml  Output    200 ml  Net   -150 ml   Filed Weights   06/02/15 1157 06/02/15 1750  Weight: 119.75 kg (264 lb) 119.296 kg (263 lb)    Exam:   General:  No acute distress, eating breakfast at initiation of exam  Cardiovascular: S1S2 I/VI crescendo systolic murmur, 2+ peripheral pulses radial and dorsalis pedis  Respiratory: clear to auscultation left lung, decreased aeration right lung fields with few rales on middle and lower lung fields  Abdomen: obese, soft, nontender, nondisteded, positive bowel sounds  Musculoskeletal: FROM UE and LE b/l, no cyanosis, clubbing or edema   Data Reviewed: Basic Metabolic Panel:  Recent Labs Lab 06/02/15 1210 06/02/15 1253 06/03/15 0635  NA 141 142 138  K 3.5 3.3* 3.2*  CL 106 101 105  CO2 26  --  27  GLUCOSE 121* 114* 106*  BUN 5* <3* <5*  CREATININE 0.76 0.70 0.71  CALCIUM 8.8*  --  7.9*   Liver Function Tests:  Recent Labs Lab 06/02/15 1210 06/03/15 0635  AST 30 24  ALT 25 21  ALKPHOS 32* 30*  BILITOT 0.3 0.4  PROT 5.7*  5.2*  ALBUMIN 3.4* 2.9*   No results for input(s): LIPASE, AMYLASE in the last 168 hours. No results for input(s): AMMONIA in the last 168 hours. CBC:  Recent Labs Lab 06/02/15 1210 06/02/15 1253 06/03/15 0635  WBC 4.6  --  3.1*  HGB 12.0 11.9* 10.4*  HCT 36.5 35.0* 31.2*  MCV 93.8  --  94.3  PLT 181  --  149*   Cardiac Enzymes:  Recent Labs Lab 06/02/15 1210  TROPONINI 0.03   BNP (last 3 results) No results for input(s): BNP in the last 8760 hours.  ProBNP  (last 3 results) No results for input(s): PROBNP in the last 8760 hours.  CBG: No results for input(s): GLUCAP in the last 168 hours.  Recent Results (from the past 240 hour(s))  Blood culture (routine x 2)     Status: None (Preliminary result)   Collection Time: 06/02/15 12:43 PM  Result Value Ref Range Status   Specimen Description RIGHT ANTECUBITAL  Final   Special Requests BOTTLES DRAWN AEROBIC AND ANAEROBIC [place]  Final   Culture PENDING  Incomplete   Report Status PENDING  Incomplete  Blood culture (routine x 2)     Status: None (Preliminary result)   Collection Time: 06/02/15 12:47 PM  Result Value Ref Range Status   Specimen Description LEFT ANTECUBITAL  Final   Special Requests BOTTLES DRAWN AEROBIC AND ANAEROBIC [facility]  Final   Culture PENDING  Incomplete   Report Status PENDING  Incomplete     Studies: Dg Chest 2 View  06/02/2015  CLINICAL DATA:  Chest pain, breast cancer on chemotherapy EXAM: CHEST  2 VIEW COMPARISON:  03/29/2015 FINDINGS: Cardiomediastinal silhouette is stable. No acute infiltrate or pleural effusion. No pulmonary edema. There is right IJ Port-A-Cath with tip in SVC. No pneumothorax. Mild infrahilar bronchitic changes. Bony thorax is unremarkable. IMPRESSION: No infiltrate or pulmonary edema. Mild infrahilar bronchitic changes. Electronically Signed   By: Lahoma Crocker M.D.   On: 06/02/2015 13:14   Ct Head Wo Contrast  06/02/2015  CLINICAL DATA:  Headache and history of breast carcinoma with treated skull base and intracranial metastatic lesions. EXAM: CT HEAD WITHOUT CONTRAST TECHNIQUE: Contiguous axial images were obtained from the base of the skull through the vertex without intravenous contrast. COMPARISON:  MRI of the brain on 03/29/2015 and head CT on 03/22/2015. FINDINGS: By unenhanced CT, the only visualized metastatic lesion is a stable 7 mm sclerotic lesion at the skull base within the left clivus. Previously visualized cerebral metastases have been  shown to have responded to radiation therapy. These are nonvisualized by unenhanced CT and there is no evidence of acute hemorrhage, visible infarction, mass effect or hydrocephalus. No extra-axial fluid collections are identified. IMPRESSION: No acute findings. The only visible metastatic lesion by unenhanced CT is a stable 7 mm sclerotic lesion in the left clivus. Electronically Signed   By: Aletta Edouard M.D.   On: 06/02/2015 15:11   Ct Angio Chest Pe W/cm &/or Wo Cm  06/02/2015  CLINICAL DATA:  Right side chest pain, history of breast cancer EXAM: CT ANGIOGRAPHY CHEST WITH CONTRAST TECHNIQUE: Multidetector CT imaging of the chest was performed using the standard protocol during bolus administration of intravenous contrast. Multiplanar CT image reconstructions and MIPs were obtained to evaluate the vascular anatomy. CONTRAST:  183mL OMNIPAQUE IOHEXOL 350 MG/ML SOLN COMPARISON:  03/29/2015 FINDINGS: Sagittal images of the spine shows again small sclerotic bone lesions within thoracic spine and sternum consistent with treated metastasis. The  visualized upper abdomen shows fatty infiltration of the liver. Stable low-density lesion in right hepatic lobe posteriorly measures 3.7 cm consistent with stable metastatic disease. Images of the thoracic inlet are unremarkable. Central airways are patent. A precarinal lymph node measures 8 mm not pathologic by size criteria. No pulmonary embolus is noted. There is no pericardial effusion. Heart size within normal limits. There is patchy infiltrate in right lower lobe superior segment and basal segment. Findings are consistent with early pneumonia. Stable mild nodular contour of the liver. Stable low-density lesion within spleen. There is no pulmonary edema. Review of the MIP images confirms the above findings. IMPRESSION: 1. No pulmonary embolus is noted. 2. There is patchy infiltrate/pneumonia right lower lobe. 3. Again noted fatty infiltration of the liver and subtle  nodular liver contour. Stable metastatic lesion in right hepatic lobe posteriorly measures 3.7 cm. 4. Stable small sclerotic bone lesions. 5. No pulmonary edema. 6. These results were called by telephone at the time of interpretation on 06/02/2015 at 3:19 pm to Dr. Ezequiel Essex , who verbally acknowledged these results. Electronically Signed   By: Lahoma Crocker M.D.   On: 06/02/2015 15:20    Scheduled Meds: . ceFEPime (MAXIPIME) IV  1 g Intravenous Q8H  . feeding supplement (ENSURE ENLIVE)  237 mL Oral BID BM  . methylphenidate  10 mg Oral q morning - 10a  . methylphenidate  10 mg Oral Q1400  . morphine  30 mg Oral Q12H  . pantoprazole  40 mg Oral Daily  . potassium chloride SA  20 mEq Oral BID  . rivaroxaban  20 mg Oral Q supper  . tamoxifen  20 mg Oral Daily  . vancomycin  1,250 mg Intravenous Q12H  . venlafaxine XR  150 mg Oral Q breakfast   Continuous Infusions:   Active Problems:   Depression   Breast cancer, stage 4 (HCC)   HCAP (healthcare-associated pneumonia)   Pneumonia    Time spent: 56 minutes    Coralie Common  Resident Physician  [facility] 06/03/2015, 9:03 AM  LOS: 1 day

## 2015-06-04 ENCOUNTER — Telehealth (HOSPITAL_COMMUNITY): Payer: Self-pay

## 2015-06-04 MED ORDER — IPRATROPIUM-ALBUTEROL 0.5-2.5 (3) MG/3ML IN SOLN
3.0000 mL | Freq: Three times a day (TID) | RESPIRATORY_TRACT | Status: DC
  Administered 2015-06-04 – 2015-06-08 (×13): 3 mL via RESPIRATORY_TRACT
  Filled 2015-06-04 (×13): qty 3

## 2015-06-04 NOTE — Telephone Encounter (Signed)
Patient is in the hospital,she has pneumonia and will call us when she gets home.

## 2015-06-04 NOTE — Progress Notes (Addendum)
[facility] PROGRESS NOTE  [name] Allison [id number] DOB: 12/02/1965 DOA: 06/02/2015 PCP: [name], MD  Assessment/Plan: 1. Right lower lobe pneumonia in immunocompromised patient 2/2 chemotherapy for Stage IV metastatic breast cancer: patient initially on broad spectrum antibiotics which include Cefepime and vancomycin to cover for Gram +, Gram - and Pseudomonas. Antibiotic changed to Ceftriaxone on 06/03/15.  Remains afebrile, normotensive, still requiring no supplemental oxygen.  Feeling slightly more congested- ordering flutter valve 2. Stage IV metastatic breast cancer: continue patient on tamoxifen.  No new lesions seen on CT  Code Status: Full Family Communication: no family at bedside Disposition Plan: home in 1-2 days pending respiratory status   Consultants:  none  Procedures:  none  Antibiotics:  Zosyn- 1 dose 06/02/15  Vancomycin pharmacy dosing q12h started 06/02/15; stopped 06/03/15  Cefepime 1g q8h started 06/02/15 stopped 06/03/15  Ceftriaxone 2g q24h started 06/03/15   HPI/Subjective: This is a 50 year old lady who has stage IV metastatic breast cancer and who just returned from a cruise in the [place] 2 days ago. She now presents with a 24-hour history of cough productive of green sputum associated with chest pain which radiates to her back. She denies objective fever but feels hot/cold. Prior to this, she has had some diarrhea. Her last chemotherapy was 10 days ago. She is a nonsmoker. She is on chronic anticoagulation therapy. Evaluation in the emergency room shows it to have right-sided pneumonia. She was admitted for further management.  She was initially treated with Vancomycin and Zosyn in the ED but was switched to Cefepime and Vancomycin at time of admission.  Antibiotic further narrowed to Ceftriaxone on 06/03/15.  Her vitals continued to be WNL and patient still not requiring supplemental oxygen.  Has been trying to get up and move around the  room as tolerated.  She reports no fevers, sweats/ chills.  Feeling slightly more congested this morning like her infection is starting to "break up".    Objective: Filed Vitals:   06/03/15 2122 06/04/15 0500  BP: 142/67 103/65  Pulse: 73 81  Temp: 98.3 F (36.8 C) 98.4 F (36.9 C)  Resp: 16 18    Intake/Output Summary (Last 24 hours) at 06/04/15 0933 Last data filed at 06/03/15 1900  Gross per 24 hour  Intake    530 ml  Output    700 ml  Net   -170 ml   Filed Weights   06/02/15 1157 06/02/15 1750  Weight: 119.75 kg (264 lb) 119.296 kg (263 lb)    Exam:   General:  No acute distress, sitting in reclining chair at initiation of exam  Cardiovascular: S1S2 I/VI crescendo systolic murmur, 2+ peripheral pulses radial and dorsalis pedis  Respiratory: right side crackles in middle and lower posterior lung fields, good respiratory effort  Abdomen: obese, soft, nontender, nondisteded, positive bowel sounds  Musculoskeletal: FROM UE and LE b/[name], no cyanosis, clubbing or edema   Data Reviewed: Basic Metabolic Panel:  Recent Labs Lab 06/02/15 1210 06/02/15 1253 06/03/15 0635  NA 141 142 138  K 3.5 3.3* 3.2*  CL 106 101 105  CO2 26  --  27  GLUCOSE 121* 114* 106*  BUN 5* <3* <5*  CREATININE 0.76 0.70 0.71  CALCIUM 8.8*  --  7.9*   Liver Function Tests:  Recent Labs Lab 06/02/15 1210 06/03/15 0635  AST 30 24  ALT 25 21  ALKPHOS 32* 30*  BILITOT 0.3 0.4  PROT 5.7* 5.2*  ALBUMIN 3.4* 2.9*   No  results for input(s): LIPASE, AMYLASE in the last 168 hours. No results for input(s): AMMONIA in the last 168 hours. CBC:  Recent Labs Lab 06/02/15 1210 06/02/15 1253 06/03/15 0635  WBC 4.6  --  3.1*  HGB 12.0 11.9* 10.4*  HCT 36.5 35.0* 31.2*  MCV 93.8  --  94.3  PLT 181  --  149*   Cardiac Enzymes:  Recent Labs Lab 06/02/15 1210  TROPONINI 0.03   BNP (last 3 results) No results for input(s): BNP in the last 8760 hours.  ProBNP (last 3 results) No  results for input(s): PROBNP in the last 8760 hours.  CBG: No results for input(s): GLUCAP in the last 168 hours.  Recent Results (from the past 240 hour(s))  Blood culture (routine x 2)     Status: None (Preliminary result)   Collection Time: 06/02/15 12:43 PM  Result Value Ref Range Status   Specimen Description BLOOD RIGHT ANTECUBITAL DRAWN BY RN  Final   Special Requests BOTTLES DRAWN AEROBIC AND ANAEROBIC 8CC EACH  Final   Culture NO GROWTH < 24 HOURS  Final   Report Status PENDING  Incomplete  Blood culture (routine x 2)     Status: None (Preliminary result)   Collection Time: 06/02/15 12:47 PM  Result Value Ref Range Status   Specimen Description BLOOD LEFT ANTECUBITAL DRAWN BY RN  Final   Special Requests BOTTLES DRAWN AEROBIC AND ANAEROBIC 6CC  Final   Culture NO GROWTH < 24 HOURS  Final   Report Status PENDING  Incomplete     Studies: Dg Chest 2 View  06/02/2015  CLINICAL DATA:  Chest pain, breast cancer on chemotherapy EXAM: CHEST  2 VIEW COMPARISON:  03/29/2015 FINDINGS: Cardiomediastinal silhouette is stable. No acute infiltrate or pleural effusion. No pulmonary edema. There is right IJ Port-A-Cath with tip in SVC. No pneumothorax. Mild infrahilar bronchitic changes. Bony thorax is unremarkable. IMPRESSION: No infiltrate or pulmonary edema. Mild infrahilar bronchitic changes. Electronically Signed   By: Lahoma Crocker M.D.   On: 06/02/2015 13:14   Ct Head Wo Contrast  06/02/2015  CLINICAL DATA:  Headache and history of breast carcinoma with treated skull base and intracranial metastatic lesions. EXAM: CT HEAD WITHOUT CONTRAST TECHNIQUE: Contiguous axial images were obtained from the base of the skull through the vertex without intravenous contrast. COMPARISON:  MRI of the brain on 03/29/2015 and head CT on 03/22/2015. FINDINGS: By unenhanced CT, the only visualized metastatic lesion is a stable 7 mm sclerotic lesion at the skull base within the left clivus. Previously visualized  cerebral metastases have been shown to have responded to radiation therapy. These are nonvisualized by unenhanced CT and there is no evidence of acute hemorrhage, visible infarction, mass effect or hydrocephalus. No extra-axial fluid collections are identified. IMPRESSION: No acute findings. The only visible metastatic lesion by unenhanced CT is a stable 7 mm sclerotic lesion in the left clivus. Electronically Signed   By: Aletta Edouard M.D.   On: 06/02/2015 15:11   Ct Angio Chest Pe W/cm &/or Wo Cm  06/02/2015  CLINICAL DATA:  Right side chest pain, history of breast cancer EXAM: CT ANGIOGRAPHY CHEST WITH CONTRAST TECHNIQUE: Multidetector CT imaging of the chest was performed using the standard protocol during bolus administration of intravenous contrast. Multiplanar CT image reconstructions and MIPs were obtained to evaluate the vascular anatomy. CONTRAST:  164mL OMNIPAQUE IOHEXOL 350 MG/ML SOLN COMPARISON:  03/29/2015 FINDINGS: Sagittal images of the spine shows again small sclerotic bone lesions within thoracic  spine and sternum consistent with treated metastasis. The visualized upper abdomen shows fatty infiltration of the liver. Stable low-density lesion in right hepatic lobe posteriorly measures 3.7 cm consistent with stable metastatic disease. Images of the thoracic inlet are unremarkable. Central airways are patent. A precarinal lymph node measures 8 mm not pathologic by size criteria. No pulmonary embolus is noted. There is no pericardial effusion. Heart size within normal limits. There is patchy infiltrate in right lower lobe superior segment and basal segment. Findings are consistent with early pneumonia. Stable mild nodular contour of the liver. Stable low-density lesion within spleen. There is no pulmonary edema. Review of the MIP images confirms the above findings. IMPRESSION: 1. No pulmonary embolus is noted. 2. There is patchy infiltrate/pneumonia right lower lobe. 3. Again noted fatty  infiltration of the liver and subtle nodular liver contour. Stable metastatic lesion in right hepatic lobe posteriorly measures 3.7 cm. 4. Stable small sclerotic bone lesions. 5. No pulmonary edema. 6. These results were called by telephone at the time of interpretation on 06/02/2015 at 3:19 pm to Dr. Ezequiel Essex , who verbally acknowledged these results. Electronically Signed   By: Lahoma Crocker M.D.   On: 06/02/2015 15:20    Scheduled Meds: . cefTRIAXone (ROCEPHIN)  IV  2 g Intravenous Q24H  . feeding supplement (ENSURE ENLIVE)  237 mL Oral BID BM  . methylphenidate  10 mg Oral q morning - 10a  . morphine  30 mg Oral Q12H  . pantoprazole  40 mg Oral Daily  . potassium chloride SA  20 mEq Oral BID  . rivaroxaban  20 mg Oral Q supper  . tamoxifen  20 mg Oral Daily  . venlafaxine XR  150 mg Oral Q breakfast   Continuous Infusions:   Active Problems:   Depression   Breast cancer, stage 4 ([place])   HCAP (healthcare-associated pneumonia)   Pneumonia   Metastatic breast cancer ([place])    Time spent: 37 minutes    Coralie Common  Resident Physician  [facility] 06/04/2015, 9:33 AM  LOS: 2 days

## 2015-06-05 ENCOUNTER — Ambulatory Visit (HOSPITAL_COMMUNITY): Payer: 59 | Admitting: Physical Therapy

## 2015-06-05 DIAGNOSIS — J189 Pneumonia, unspecified organism: Principal | ICD-10-CM

## 2015-06-05 DIAGNOSIS — K219 Gastro-esophageal reflux disease without esophagitis: Secondary | ICD-10-CM

## 2015-06-05 DIAGNOSIS — R9431 Abnormal electrocardiogram [ECG] [EKG]: Secondary | ICD-10-CM | POA: Insufficient documentation

## 2015-06-05 DIAGNOSIS — C50919 Malignant neoplasm of unspecified site of unspecified female breast: Secondary | ICD-10-CM

## 2015-06-05 DIAGNOSIS — R531 Weakness: Secondary | ICD-10-CM | POA: Insufficient documentation

## 2015-06-05 LAB — BASIC METABOLIC PANEL
ANION GAP: 5 (ref 5–15)
BUN: <5 mg/dL — ABNORMAL LOW (ref 6–20)
CALCIUM: 7.8 mg/dL — AB (ref 8.9–10.3)
CO2: 29 mmol/L (ref 22–32)
Chloride: 106 mmol/L (ref 101–111)
Creatinine, Ser: 0.64 mg/dL (ref 0.44–1.00)
Glucose, Bld: 127 mg/dL — ABNORMAL HIGH (ref 65–99)
Potassium: 3.5 mmol/L (ref 3.5–5.1)
SODIUM: 140 mmol/L (ref 135–145)

## 2015-06-05 MED ORDER — GUAIFENESIN ER 600 MG PO TB12
1200.0000 mg | ORAL_TABLET | Freq: Two times a day (BID) | ORAL | Status: DC
  Administered 2015-06-05 – 2015-06-08 (×7): 1200 mg via ORAL
  Filled 2015-06-05 (×7): qty 2

## 2015-06-05 MED ORDER — DEXTROSE 5 % IV SOLN
500.0000 mg | INTRAVENOUS | Status: DC
  Administered 2015-06-05 – 2015-06-06 (×2): 500 mg via INTRAVENOUS
  Filled 2015-06-05 (×3): qty 500

## 2015-06-05 MED ORDER — NITROGLYCERIN 0.4 MG SL SUBL
0.4000 mg | SUBLINGUAL_TABLET | SUBLINGUAL | Status: DC | PRN
  Administered 2015-06-05: 0.4 mg via SUBLINGUAL
  Filled 2015-06-05: qty 1

## 2015-06-05 NOTE — Progress Notes (Signed)
[facility] PROGRESS NOTE  [name] [id number] DOB: Sep 01, 1965 DOA: 06/02/2015 PCP: [name], MD  Assessment/Plan: 1. CAP, continue IV abx. No leukocytosis or fever. BC pending. Will continue bronchodilators and pulmonary hygiene. Pt is still short of breath and does not feel well. Will continue with current treatments and add Azithromycin.  2. Stage IV metastatic breast CA. Continue Tamoxifen. CT scan unchanged.  3. GERD, continue PPI.  4. History of DVT, will continue on Xarelto.  Code Status: Full DVT prophylaxis: Xarelto Family Communication: Husband at bedside Disposition Plan: Anticipate discharge within 24-48 hrs.   Consultants:    Procedures:    Antibiotics:  Zosyn 1/22>>1/22  Vancomycin 1/22>>1/23  Cefepime 1/22>>1/23  Ceftriaxone 1/23>>  Azithromycin 1/25 >>   HPI/Subjective: Pt does not feel well, is alert and aware. Has SOB and requires O2. Patient has been coughing, but is not productive. Slept well throughout night but feels tired this morning. Has been able to ambulate well.  Objective: Filed Vitals:   06/04/15 2143 06/05/15 0615  BP: 124/61 138/70  Pulse: 84 88  Temp: 98.9 F (37.2 C) 99.1 F (37.3 C)  Resp: 18 18    Intake/Output Summary (Last 24 hours) at 06/05/15 0739 Last data filed at 06/05/15 [id number]  Gross per 24 hour  Intake    480 ml  Output   1600 ml  Net  -1120 ml   Filed Weights   06/02/15 1157 06/02/15 1750  Weight: 119.75 kg (264 lb) 119.296 kg (263 lb)    Exam:  General: NAD, looks comfortable Cardiovascular: RRR, S1, S2  Respiratory:  Bilateral crackles Abdomen: soft, non tender, no distention , bowel sounds normal Musculoskeletal: No edema b/l  Data Reviewed: Basic Metabolic Panel:  Recent Labs Lab 06/02/15 1210 06/02/15 1253 06/03/15 0635 06/05/15 0625  NA 141 142 138 140  K 3.5 3.3* 3.2* 3.5  CL 106 101 105 106  CO2 26  --  27 29  GLUCOSE 121* 114* 106* 127*  BUN 5* <3* <5*  <5*  CREATININE 0.76 0.70 0.71 0.64  CALCIUM 8.8*  --  7.9* 7.8*   Liver Function Tests:  Recent Labs Lab 06/02/15 1210 06/03/15 0635  AST 30 24  ALT 25 21  ALKPHOS 32* 30*  BILITOT 0.3 0.4  PROT 5.7* 5.2*  ALBUMIN 3.4* 2.9*    CBC:  Recent Labs Lab 06/02/15 1210 06/02/15 1253 06/03/15 0635  WBC 4.6  --  3.1*  HGB 12.0 11.9* 10.4*  HCT 36.5 35.0* 31.2*  MCV 93.8  --  94.3  PLT 181  --  149*   Cardiac Enzymes:  Recent Labs Lab 06/02/15 1210  TROPONINI 0.03    Recent Results (from the past 240 hour(s))  Blood culture (routine x 2)     Status: None (Preliminary result)   Collection Time: 06/02/15 12:43 PM  Result Value Ref Range Status   Specimen Description BLOOD RIGHT ANTECUBITAL DRAWN BY RN  Final   Special Requests BOTTLES DRAWN AEROBIC AND ANAEROBIC 8CC EACH  Final   Culture NO GROWTH 2 DAYS  Final   Report Status PENDING  Incomplete  Blood culture (routine x 2)     Status: None (Preliminary result)   Collection Time: 06/02/15 12:47 PM  Result Value Ref Range Status   Specimen Description BLOOD LEFT ANTECUBITAL DRAWN BY RN  Final   Special Requests BOTTLES DRAWN AEROBIC AND ANAEROBIC 6CC  Final   Culture NO GROWTH 2 DAYS  Final   Report Status PENDING  Incomplete     Scheduled Meds: . cefTRIAXone (ROCEPHIN)  IV  2 g Intravenous Q24H  . feeding supplement (ENSURE ENLIVE)  237 mL Oral BID BM  . ipratropium-albuterol  3 mL Nebulization TID  . methylphenidate  10 mg Oral q morning - 10a  . morphine  30 mg Oral Q12H  . pantoprazole  40 mg Oral Daily  . potassium chloride SA  20 mEq Oral BID  . rivaroxaban  20 mg Oral Q supper  . tamoxifen  20 mg Oral Daily  . venlafaxine XR  150 mg Oral Q breakfast   Continuous Infusions:   Active Problems:   Depression   Breast cancer, stage 4 ([place])   HCAP (healthcare-associated pneumonia)   Pneumonia   Metastatic breast cancer ([place])    Time spent: 25  minutes   Josh Nicolosi. MD [facility] Pager [phone]. If 7PM-7AM, please contact night-coverage at [email or web address], password [email or web address] 06/05/2015, 7:39 AM  LOS: 3 days      By signing my name below, I, Rosalie Doctor, attest that this documentation has been prepared under the direction and in the presence of [facility]. MD Electronically Signed: Rosalie Doctor, Scribe. 06/05/2015 11:04am  I, Dr. Kathie Dike, personally performed the services described in this documentaiton. All medical record entries made by the scribe were at my direction and in my presence. I have reviewed the chart and agree that the record reflects my personal performance and is accurate and complete  Kathie Dike, MD, 06/05/2015 11:19 AM

## 2015-06-05 NOTE — Progress Notes (Signed)
Pt in bed with c/o chest pressure She states its like an elephant on my chest VS BP 128/57 HR 85, RR 20 O2 sat 90% on room air Dr Roderic Palau notified 12 lead EKG ordered O2 @2l  applied to pt nares.

## 2015-06-05 NOTE — Progress Notes (Signed)
Present with patient and her husband for support. They had just returned from a trip and then she got sick. She also shared a good friend dies suddenly this week. They shared about their trip and the plans for transitions they are going through. Mr Czaplewski has just retired from his position of 38 years. Will continue to follow up for support. Prayed with them.

## 2015-06-06 MED ORDER — DEXTROMETHORPHAN POLISTIREX ER 30 MG/5ML PO SUER
30.0000 mg | Freq: Two times a day (BID) | ORAL | Status: DC | PRN
  Administered 2015-06-06: 30 mg via ORAL
  Filled 2015-06-06 (×2): qty 5

## 2015-06-06 NOTE — Progress Notes (Signed)
[facility] PROGRESS NOTE  [name] [id number] DOB: Apr 08, 1966 DOA: 06/02/2015 PCP: [name], MD  Assessment/Plan: 1. CAP. No leukocytosis or fever. BC show no growth to date, however, final results are still pending. She continues to feel short of breath and has minimal cough. Will continue abx, bronchodilators and pulmonary hygiene. 2. Stage IV metastatic breast CA. Continue Tamoxifen. CT scan unchanged.  3. GERD, continue PPI. Difficulty eating, will begin on Zofran. 4. History of DVT, will continue on Xarelto.  Code Status: Full DVT prophylaxis: Xarelto Family Communication: Husband and sister at bedside Disposition Plan: Anticipate discharge within 24-48 hrs.   Consultants:    Procedures:    Antibiotics:  Zosyn 1/22>>1/22  Vancomycin 1/22>>1/23  Cefepime 1/22>>1/23  Ceftriaxone 1/23>>  Azithromycin 1/25 >>   HPI/Subjective: Feeling better. Productive cough. Difficulty eating because of lack of appetite. Has not eaten full meal since last weekend. Jaw pain today. Felt nauseous last night. Did not ambulate yesterday and felt short of breath last night.   Objective: Filed Vitals:   06/05/15 2153 06/06/15 0637  BP: 124/46 121/59  Pulse: 89 88  Temp:  99.1 F (37.3 C)  Resp: 20 17    Intake/Output Summary (Last 24 hours) at 06/06/15 0738 Last data filed at 06/05/15 1845  Gross per 24 hour  Intake    600 ml  Output      0 ml  Net    600 ml   Filed Weights   06/02/15 1157 06/02/15 1750  Weight: 119.75 kg (264 lb) 119.296 kg (263 lb)    Exam:  General: NAD, looks comfortable Cardiovascular: RRR, S1, S2  Respiratory:  Bilateral ronchi Abdomen: soft, non tender, no distention , bowel sounds normal Musculoskeletal: No edema b/l  Data Reviewed: Basic Metabolic Panel:  Recent Labs Lab 06/02/15 1210 06/02/15 1253 06/03/15 0635 06/05/15 0625  NA 141 142 138 140  K 3.5 3.3* 3.2* 3.5  CL 106 101 105 106  CO2 26  --  27 29   GLUCOSE 121* 114* 106* 127*  BUN 5* <3* <5* <5*  CREATININE 0.76 0.70 0.71 0.64  CALCIUM 8.8*  --  7.9* 7.8*   Liver Function Tests:  Recent Labs Lab 06/02/15 1210 06/03/15 0635  AST 30 24  ALT 25 21  ALKPHOS 32* 30*  BILITOT 0.3 0.4  PROT 5.7* 5.2*  ALBUMIN 3.4* 2.9*    CBC:  Recent Labs Lab 06/02/15 1210 06/02/15 1253 06/03/15 0635  WBC 4.6  --  3.1*  HGB 12.0 11.9* 10.4*  HCT 36.5 35.0* 31.2*  MCV 93.8  --  94.3  PLT 181  --  149*   Cardiac Enzymes:  Recent Labs Lab 06/02/15 1210  TROPONINI 0.03    Recent Results (from the past 240 hour(s))  Blood culture (routine x 2)     Status: None (Preliminary result)   Collection Time: 06/02/15 12:43 PM  Result Value Ref Range Status   Specimen Description BLOOD RIGHT ANTECUBITAL DRAWN BY RN  Final   Special Requests BOTTLES DRAWN AEROBIC AND ANAEROBIC 8CC EACH  Final   Culture NO GROWTH 3 DAYS  Final   Report Status PENDING  Incomplete  Blood culture (routine x 2)     Status: None (Preliminary result)   Collection Time: 06/02/15 12:47 PM  Result Value Ref Range Status   Specimen Description BLOOD LEFT ANTECUBITAL DRAWN BY RN  Final   Special Requests BOTTLES DRAWN AEROBIC AND ANAEROBIC 6CC  Final   Culture NO GROWTH  3 DAYS  Final   Report Status PENDING  Incomplete     Scheduled Meds: . azithromycin  500 mg Intravenous Q24H  . cefTRIAXone (ROCEPHIN)  IV  2 g Intravenous Q24H  . feeding supplement (ENSURE ENLIVE)  237 mL Oral BID BM  . guaiFENesin  1,200 mg Oral BID  . ipratropium-albuterol  3 mL Nebulization TID  . methylphenidate  10 mg Oral q morning - 10a  . morphine  30 mg Oral Q12H  . pantoprazole  40 mg Oral Daily  . potassium chloride SA  20 mEq Oral BID  . rivaroxaban  20 mg Oral Q supper  . tamoxifen  20 mg Oral Daily  . venlafaxine XR  150 mg Oral Q breakfast   Continuous Infusions:   Active Problems:   Depression   Breast cancer, stage 4 ([place])   HCAP (healthcare-associated pneumonia)    Pneumonia   Metastatic breast cancer ([place])    Time spent: 25  minutes   Jehanzeb Memon. MD [facility] Pager [phone]. If 7PM-7AM, please contact night-coverage at [email or web address], password [facility] 06/06/2015, 7:38 AM  LOS: 4 days      By signing my name below, I, Rosalie Doctor, attest that this documentation has been prepared under the direction and in the presence of [facility]. MD Electronically Signed: Rosalie Doctor, Scribe. 06/06/2015 12:29pm   I, Dr. Kathie Dike, personally performed the services described in this documentaiton. All medical record entries made by the scribe were at my direction and in my presence. I have reviewed the chart and agree that the record reflects my personal performance and is accurate and complete  Kathie Dike, MD, 06/06/2015 12:38 PM

## 2015-06-06 NOTE — Care Management Note (Signed)
Case Management Note  Patient Details  Name: [name] MRN: [id number] Date of Birth: [birth year]  Subjective/Objective:      Discussed Xorelto, patient insurance is covering and patient able to afford. No other CM needs identified.  Patient has been on this drug for 9 months.            Action/Plan: No needs.  Expected Discharge Date:                  Expected Discharge Plan:  Home/Self Care  In-House Referral:     Discharge planning Services  CM Consult  Post Acute Care Choice:    Choice offered to:     DME Arranged:    DME Agency:     HH Arranged:    [place] Agency:     Status of Service:  Completed, signed off  Medicare Important Message Given:    Date Medicare IM Given:    Medicare IM give by:    Date Additional Medicare IM Given:    Additional Medicare Important Message give by:     If discussed at [place] of Stay Meetings, dates discussed:    Additional Comments:  Alvie Heidelberg, RN 06/06/2015, 4:40 PM

## 2015-06-07 ENCOUNTER — Inpatient Hospital Stay (HOSPITAL_COMMUNITY): Payer: 59

## 2015-06-07 ENCOUNTER — Ambulatory Visit (HOSPITAL_COMMUNITY): Payer: 59

## 2015-06-07 ENCOUNTER — Ambulatory Visit (HOSPITAL_COMMUNITY): Payer: 59 | Admitting: Hematology & Oncology

## 2015-06-07 LAB — BASIC METABOLIC PANEL
Anion gap: 6 (ref 5–15)
CHLORIDE: 107 mmol/L (ref 101–111)
CO2: 27 mmol/L (ref 22–32)
CREATININE: 0.53 mg/dL (ref 0.44–1.00)
Calcium: 8.1 mg/dL — ABNORMAL LOW (ref 8.9–10.3)
GFR calc Af Amer: >60 mL/min (ref >60)
GFR calc non Af Amer: >60 mL/min (ref >60)
GLUCOSE: 104 mg/dL — AB (ref 65–99)
Potassium: 4 mmol/L (ref 3.5–5.1)
Sodium: 140 mmol/L (ref 135–145)

## 2015-06-07 LAB — CBC
HCT: 31.9 % — ABNORMAL LOW (ref 36.0–46.0)
Hemoglobin: 10.4 g/dL — ABNORMAL LOW (ref 12.0–15.0)
MCH: 31 pg (ref 26.0–34.0)
MCHC: 32.6 g/dL (ref 30.0–36.0)
MCV: 94.9 fL (ref 78.0–100.0)
PLATELETS: 161 10*3/uL (ref 150–400)
RBC: 3.36 MIL/uL — ABNORMAL LOW (ref 3.87–5.11)
RDW: 13.2 % (ref 11.5–15.5)
WBC: 3.4 10*3/uL — ABNORMAL LOW (ref 4.0–10.5)

## 2015-06-07 LAB — CULTURE, BLOOD (ROUTINE X 2)
CULTURE: NO GROWTH
Culture: NO GROWTH

## 2015-06-07 MED ORDER — METHYLPREDNISOLONE SODIUM SUCC 125 MG IJ SOLR
60.0000 mg | Freq: Two times a day (BID) | INTRAMUSCULAR | Status: DC
  Administered 2015-06-07 – 2015-06-08 (×3): 60 mg via INTRAVENOUS
  Filled 2015-06-07 (×3): qty 2

## 2015-06-07 MED ORDER — AZITHROMYCIN 250 MG PO TABS
500.0000 mg | ORAL_TABLET | Freq: Every day | ORAL | Status: DC
  Administered 2015-06-07 – 2015-06-08 (×2): 500 mg via ORAL
  Filled 2015-06-07 (×2): qty 2

## 2015-06-07 NOTE — Care Management Important Message (Signed)
Important Message  Patient Details  Name: [name] MRN: [id number] Date of Birth: [birth year]   Medicare Important Message Given:  Yes    Alvie Heidelberg, RN 06/07/2015, 11:03 AM

## 2015-06-07 NOTE — Discharge Summary (Signed)
Physician Discharge Summary  [name] [id number] DOB: June 22, 1965 DOA: 06/02/2015  PCP: [name], MD  Admit date: 06/02/2015 Discharge date: 06/08/2015  Time spent: 35 minutes  Recommendations for Outpatient Follow-up:  1. Follow up with PCP in 1-2 weeks 2. Repeat CXR in 3-4 weeks to ensure resolution of PNA   Discharge Diagnoses:  Active Problems:   Depression   Breast cancer, stage 4 ([place])   HCAP (healthcare-associated pneumonia)   Pneumonia   Metastatic breast cancer [facility])   Discharge Condition: Improved  Diet recommendation: Regular  Filed Weights   06/02/15 1157 06/02/15 1750  Weight: 119.75 kg (264 lb) 119.296 kg (263 lb)    History of present illness:  28 yof with stage IV metastatic breast cancer presented with a cough beginning on 1/21. Cough ws productive of green sputum associated with a CP which she described as radiating to her back. Though she denied any objective fever, she claims that she feels hot/cold. She also states that she had been experiencing diarrhea prior to seeking medical help. Patient is undergoing chemotherapy and is on chronic anticoagulation therapy.. She denies    Hospital Course:  Patient was treated with intravenous antibiotics and bronchodilators. She continued to be short of breath and wheezing and required supplemental oxygen. She had continued wheezing and was started on steroids, which had a significant improvement. She is now breathing comfortably on room air. She is minimally coughing and no longer wheezing. She is able to ambulate comfortably on room air. She has completed a course of abx in the hospital and will need a repeat chest xray in 3-4 weeks to ensure resolution of pneumonia. The remainder of her medical issues remained stable.  Procedures:    Consultations:    Discharge Exam: Filed Vitals:   06/08/15 0556 06/08/15 1400  BP: 114/59 120/60  Pulse: 62 73  Temp: 97.7 F (36.5 C) 98.7 F (37.1 C)  Resp:  18 18    General: NAD Cardiovascular: S1, S2 RRR Respiratory: CTA B  Discharge Instructions   Discharge Instructions    DME Nebulizer machine    Complete by:  As directed      Diet - low sodium heart healthy    Complete by:  As directed      Increase activity slowly    Complete by:  As directed           Current Discharge Medication List    START taking these medications   Details  guaiFENesin (MUCINEX) 600 MG 12 hr tablet Take 2 tablets (1,200 mg total) by mouth 2 (two) times daily. Qty: 30 tablet, Refills: 0    ipratropium-albuterol (DUONEB) 0.5-2.5 (3) MG/3ML SOLN Take 3 mLs by nebulization every 6 (six) hours as needed. Qty: 360 mL, Refills: 0    predniSONE (DELTASONE) 10 MG tablet Take 40mg  po daily for 1 day then 30mg  daily for 1 day then 20mg  daily for 1 day then 10mg  daily for 1 day then stop Qty: 10 tablet, Refills: 0      CONTINUE these medications which have NOT CHANGED   Details  Calcium-Phosphorus-Vitamin D (CALCIUM/D3 ADULT GUMMIES PO) Take by mouth. Patient taking 4 gummies a day to equal her dosage of calcium 1200mg  and vitamin d 1000mg     denosumab (XGEVA) 120 MG/1.7ML SOLN injection Inject 120 mg into the skin once.    diphenhydrAMINE (BENADRYL) 25 mg capsule Take 25 mg by mouth every 6 (six) hours as needed for itching.    furosemide (LASIX) 20  MG tablet Take 2 tablets (40 mg total) by mouth daily as needed. Qty: 60 tablet, Refills: 1   Associated Diagnoses: Bilateral edema of lower extremity    ibuprofen (ADVIL,MOTRIN) 200 MG tablet Take 400 mg by mouth every 6 (six) hours as needed.    lidocaine-prilocaine (EMLA) cream Apply 1 application topically daily as needed (apply to port before chemo).    Associated Diagnoses: Breast cancer, stage 4, unspecified laterality ([place]); Adenocarcinoma determined by biopsy of liver ([facility])    LORazepam (ATIVAN) 0.5 MG tablet Take 0.5 mg by mouth every 6 (six) hours as needed for anxiety.   Associated Diagnoses:  Bone metastases ([place]); Breast cancer, stage 4, unspecified laterality ([place]); Preventative health care    meclizine (ANTIVERT) 25 MG tablet Take 1 tablet (25 mg total) by mouth every 6 (six) hours as needed for dizziness. Qty: 30 tablet, Refills: 1   Associated Diagnoses: Breast cancer, stage 4, unspecified laterality ([place]); Bone metastases (H[facility])    methylphenidate (RITALIN) 10 MG tablet Take 2 in the morning and 1 at lunch Qty: 90 tablet, Refills: 0   Associated Diagnoses: Chronic fatigue    morphine (MS CONTIN) 30 MG 12 hr tablet Take 1 tablet (30 mg total) by mouth every 12 (twelve) hours. Qty: 60 tablet, Refills: 0   Associated Diagnoses: Bone metastases ([place]); Breast cancer, stage 4, unspecified laterality ([facility])    ondansetron (ZOFRAN) 8 MG tablet Take 1 tablet (8 mg total) by mouth every 8 (eight) hours as needed for nausea or vomiting. Qty: 30 tablet, Refills: 1   Associated Diagnoses: Breast cancer, stage 4, unspecified laterality ([facility])    oxyCODONE (OXY IR/ROXICODONE) 5 MG immediate release tablet Take 1-2 tablets (5-10 mg total) by mouth every 4 (four) hours as needed. Qty: 60 tablet, Refills: 0   Associated Diagnoses: Bone metastases ([place]); Breast cancer, stage 4, unspecified laterality (H[facility])    potassium chloride SA (K-DUR,KLOR-CON) 20 MEQ tablet Take 1 tablet (20 mEq total) by mouth 2 (two) times daily. Qty: 60 tablet, Refills: 1   Associated Diagnoses: Hypokalemia    prochlorperazine (COMPAZINE) 10 MG tablet Take 1 tablet (10 mg total) by mouth every 6 (six) hours as needed for nausea or vomiting. Qty: 30 tablet, Refills: 3   Associated Diagnoses: Breast cancer, stage 4, unspecified laterality (H[facility])    promethazine (PHENERGAN) 25 MG tablet Take 1 tablet (25 mg total) by mouth every 6 (six) hours as needed for nausea or vomiting. Qty: 30 tablet, Refills: 1   Associated Diagnoses: Breast cancer, stage 4, unspecified laterality ([place]); Bone metastases (H[facility])    rivaroxaban  (XARELTO) 20 MG TABS tablet Take 1 tablet (20 mg total) by mouth daily with supper. Qty: 30 tablet, Refills: 6    SUMAtriptan (IMITREX) 100 MG tablet Take 1 tablet (100 mg total) by mouth as needed. foir migraine Qty: 10 tablet, Refills: 3    tamoxifen (NOLVADEX) 20 MG tablet Take 1 tablet (20 mg total) by mouth daily. Qty: 30 tablet, Refills: 2   Associated Diagnoses: Breast cancer, stage 4, unspecified laterality ([place])    Trastuzumab (HERCEPTIN IV) Inject into the vein.    venlafaxine XR (EFFEXOR-XR) 75 MG 24 hr capsule TAKE 2 CAPSULES BY MOUTH DAILY WITH BREAKFAST Qty: 60 capsule, Refills: 3    zolpidem (AMBIEN) 10 MG tablet Take 10 mg by mouth at bedtime as needed.   Associated Diagnoses: Bone metastases ([place]); Breast cancer, stage 4, unspecified laterality ([place]); Preventative health care    pantoprazole (PROTONIX) 40 MG tablet  TAKE 1 TABLET BY MOUTH EVERY DAY Qty: 30 tablet, Refills: 3    scopolamine (TRANSDERM-SCOP) 1 MG/3DAYS Place 1 patch (1.5 mg total) onto the skin every 3 (three) days. Qty: 3 patch, Refills: 0   Associated Diagnoses: Sea sickness, initial encounter      STOP taking these medications     ciprofloxacin (CIPRO) 500 MG tablet      dexamethasone (DECADRON) 4 MG tablet        No Known Allergies    The results of significant diagnostics from this hospitalization (including imaging, microbiology, ancillary and laboratory) are listed below for reference.    Significant Diagnostic Studies: Dg Chest 2 View  06/07/2015  CLINICAL DATA:  Cough and congestion.  Breast cancer. EXAM: CHEST  2 VIEW COMPARISON:  06/02/2015 FINDINGS: Anterior ill-defined pulmonary opacity has developed, seen best on the lateral view. Normal heart size. Stable right jugular Port-A-Cath. No pneumothorax. No pleural effusion. Chronic right rib deformity. IMPRESSION: New ill-defined pulmonary opacity in the anterior lung seen on the lateral view. Followup PA and lateral chest X-ray is  recommended in 3-4 weeks following trial of antibiotic therapy to ensure resolution and exclude underlying malignancy. Electronically Signed   By: Marybelle Killings M.D.   On: 06/07/2015 13:05   Dg Chest 2 View  06/02/2015  CLINICAL DATA:  Chest pain, breast cancer on chemotherapy EXAM: CHEST  2 VIEW COMPARISON:  03/29/2015 FINDINGS: Cardiomediastinal silhouette is stable. No acute infiltrate or pleural effusion. No pulmonary edema. There is right IJ Port-A-Cath with tip in SVC. No pneumothorax. Mild infrahilar bronchitic changes. Bony thorax is unremarkable. IMPRESSION: No infiltrate or pulmonary edema. Mild infrahilar bronchitic changes. Electronically Signed   By: Lahoma Crocker M.D.   On: 06/02/2015 13:14   Ct Head Wo Contrast  06/02/2015  CLINICAL DATA:  Headache and history of breast carcinoma with treated skull base and intracranial metastatic lesions. EXAM: CT HEAD WITHOUT CONTRAST TECHNIQUE: Contiguous axial images were obtained from the base of the skull through the vertex without intravenous contrast. COMPARISON:  MRI of the brain on 03/29/2015 and head CT on 03/22/2015. FINDINGS: By unenhanced CT, the only visualized metastatic lesion is a stable 7 mm sclerotic lesion at the skull base within the left clivus. Previously visualized cerebral metastases have been shown to have responded to radiation therapy. These are nonvisualized by unenhanced CT and there is no evidence of acute hemorrhage, visible infarction, mass effect or hydrocephalus. No extra-axial fluid collections are identified. IMPRESSION: No acute findings. The only visible metastatic lesion by unenhanced CT is a stable 7 mm sclerotic lesion in the left clivus. Electronically Signed   By: Aletta Edouard M.D.   On: 06/02/2015 15:11   Ct Angio Chest Pe W/cm &/or Wo Cm  06/02/2015  CLINICAL DATA:  Right side chest pain, history of breast cancer EXAM: CT ANGIOGRAPHY CHEST WITH CONTRAST TECHNIQUE: Multidetector CT imaging of the chest was  performed using the standard protocol during bolus administration of intravenous contrast. Multiplanar CT image reconstructions and MIPs were obtained to evaluate the vascular anatomy. CONTRAST:  116mL OMNIPAQUE IOHEXOL 350 MG/ML SOLN COMPARISON:  03/29/2015 FINDINGS: Sagittal images of the spine shows again small sclerotic bone lesions within thoracic spine and sternum consistent with treated metastasis. The visualized upper abdomen shows fatty infiltration of the liver. Stable low-density lesion in right hepatic lobe posteriorly measures 3.7 cm consistent with stable metastatic disease. Images of the thoracic inlet are unremarkable. Central airways are patent. A precarinal lymph node measures 8 mm  not pathologic by size criteria. No pulmonary embolus is noted. There is no pericardial effusion. Heart size within normal limits. There is patchy infiltrate in right lower lobe superior segment and basal segment. Findings are consistent with early pneumonia. Stable mild nodular contour of the liver. Stable low-density lesion within spleen. There is no pulmonary edema. Review of the MIP images confirms the above findings. IMPRESSION: 1. No pulmonary embolus is noted. 2. There is patchy infiltrate/pneumonia right lower lobe. 3. Again noted fatty infiltration of the liver and subtle nodular liver contour. Stable metastatic lesion in right hepatic lobe posteriorly measures 3.7 cm. 4. Stable small sclerotic bone lesions. 5. No pulmonary edema. 6. These results were called by telephone at the time of interpretation on 06/02/2015 at 3:19 pm to Dr. Ezequiel Essex , who verbally acknowledged these results. Electronically Signed   By: Lahoma Crocker M.D.   On: 06/02/2015 15:20    Microbiology: Recent Results (from the past 240 hour(s))  Blood culture (routine x 2)     Status: None   Collection Time: 06/02/15 12:43 PM  Result Value Ref Range Status   Specimen Description BLOOD RIGHT ANTECUBITAL DRAWN BY RN  Final   Special  Requests BOTTLES DRAWN AEROBIC AND ANAEROBIC [place]  Final   Culture NO GROWTH 5 DAYS  Final   Report Status 06/07/2015 FINAL  Final  Blood culture (routine x 2)     Status: None   Collection Time: 06/02/15 12:47 PM  Result Value Ref Range Status   Specimen Description BLOOD LEFT ANTECUBITAL DRAWN BY RN  Final   Special Requests BOTTLES DRAWN AEROBIC AND ANAEROBIC [facility]  Final   Culture NO GROWTH 5 DAYS  Final   Report Status 06/07/2015 FINAL  Final     Labs: Basic Metabolic Panel:  Recent Labs Lab 06/02/15 1210 06/02/15 1253 06/03/15 0635 06/05/15 0625 06/07/15 0634  NA 141 142 138 140 140  K 3.5 3.3* 3.2* 3.5 4.0  CL 106 101 105 106 107  CO2 26  --  27 29 27   GLUCOSE 121* 114* 106* 127* 104*  BUN 5* <3* <5* <5* <5*  CREATININE 0.76 0.70 0.71 0.64 0.53  CALCIUM 8.8*  --  7.9* 7.8* 8.1*   Liver Function Tests:  Recent Labs Lab 06/02/15 1210 06/03/15 0635  AST 30 24  ALT 25 21  ALKPHOS 32* 30*  BILITOT 0.3 0.4  PROT 5.7* 5.2*  ALBUMIN 3.4* 2.9*   CBC:  Recent Labs Lab 06/02/15 1210 06/02/15 1253 06/03/15 0635 06/07/15 0634  WBC 4.6  --  3.1* 3.4*  HGB 12.0 11.9* 10.4* 10.4*  HCT 36.5 35.0* 31.2* 31.9*  MCV 93.8  --  94.3 94.9  PLT 181  --  149* 161   Cardiac Enzymes:  Recent Labs Lab 06/02/15 1210  TROPONINI 0.03      Signed:  Jehanzeb Memon. MD [facility] 06/08/2015, 6:58 PM

## 2015-06-07 NOTE — Progress Notes (Signed)
SATURATION QUALIFICATIONS: (This note is used to comply with regulatory documentation for home oxygen)  Patient Saturations on Room Air at Rest = 91  Patient Saturations on Room Air while Ambulating = 88%  Patient Saturations on 2 Liters of oxygen while Ambulating = 91%  Please briefly explain why patient needs home oxygen: 

## 2015-06-07 NOTE — Progress Notes (Signed)
PHARMACIST - PHYSICIAN COMMUNICATION DR:   Memon CONCERNING: Antibiotic IV to Oral Route Change Policy  RECOMMENDATION: This patient is receiving Zithromax by the intravenous route.  Based on criteria approved by the Pharmacy and Therapeutics Committee, the antibiotic(s) is/are being converted to the equivalent oral dose form(s).   DESCRIPTION: These criteria include:  Patient being treated for a respiratory tract infection, urinary tract infection, cellulitis or clostridium difficile associated diarrhea if on metronidazole  The patient is not neutropenic and does not exhibit a GI malabsorption state  The patient is eating (either orally or via tube) and/or has been taking other orally administered medications for a least 24 hours  The patient is improving clinically and has a Tmax < 100.5  If you have questions about this conversion, please contact the Pharmacy Department  [x]  ( [phone] )  [place] []  ( [phone] )  [facility] []  ( [phone]  [place] []  ( [phone]  [facility] []  ( [phone] )  [facility]     S. Hennesy Sobalvarro, PharmD 

## 2015-06-07 NOTE — Progress Notes (Signed)
[facility] PROGRESS NOTE  Alexicia Cellini Macchi [id number] DOB: [birth year] DOA: 06/02/2015 PCP: [name], MD  Assessment/Plan: 1. CAP. No leukocytosis or fever. BC are unremarkable. She continues to feel short of breath and has a productive cough. Will continue  abx, bronchodilators and pulmonary hygiene. Will repeat CXR today. Since the patient is still wheezing despite nebs, will add low dose solumedrol.  2. Stage IV metastatic breast CA. Continue Tamoxifen. CT scan unchanged.  3. GERD, continue PPI. Difficulty eating, improved with Zofran. 4. History of DVT, will continue on Xarelto.  Code Status: Full DVT prophylaxis: Xarelto Family Communication: Husband at bedside  Disposition Plan: Anticipate discharge within 24-48 hrs.    Consultants:  none  Procedures:  none  Antibiotics:  Zosyn 1/22>>1/22  Vancomycin 1/22>>1/23  Cefepime 1/22>>1/23  Ceftriaxone 1/23>>  Azithromycin 1/25 >>   HPI/Subjective: Felt better yesterday. Feels tired today, has not left bed. She states that she also felt unwell last night with a productive cough. Does not believe that her breathing has improved.   Objective: Filed Vitals:   06/07/15 0512 06/07/15 0742  BP: 107/57   Pulse: 72 72  Temp: 98.6 F (37 C)   Resp: 17 16    Intake/Output Summary (Last 24 hours) at 06/07/15 1051 Last data filed at 06/07/15 0800  Gross per 24 hour  Intake      0 ml  Output      0 ml  Net      0 ml   Filed Weights   06/02/15 1157 06/02/15 1750  Weight: 119.75 kg (264 lb) 119.296 kg (263 lb)    Exam:  General: NAD, looks comfortable Cardiovascular: RRR, S1, S2  Respiratory:  Bilatera wheeze Abdomen: soft, non tender, no distention , bowel sounds normal Musculoskeletal:  1+ edema b/l  Data Reviewed: Basic Metabolic Panel:  Recent Labs Lab 06/02/15 1210 06/02/15 1253 06/03/15 0635 06/05/15 0625 06/07/15 0634  NA 141 142 138 140 140  K 3.5 3.3* 3.2* 3.5 4.0  CL 106 101  105 106 107  CO2 26  --  27 29 27   GLUCOSE 121* 114* 106* 127* 104*  BUN 5* <3* <5* <5* <5*  CREATININE 0.76 0.70 0.71 0.64 0.53  CALCIUM 8.8*  --  7.9* 7.8* 8.1*   Liver Function Tests:  Recent Labs Lab 06/02/15 1210 06/03/15 0635  AST 30 24  ALT 25 21  ALKPHOS 32* 30*  BILITOT 0.3 0.4  PROT 5.7* 5.2*  ALBUMIN 3.4* 2.9*    CBC:  Recent Labs Lab 06/02/15 1210 06/02/15 1253 06/03/15 0635 06/07/15 0634  WBC 4.6  --  3.1* 3.4*  HGB 12.0 11.9* 10.4* 10.4*  HCT 36.5 35.0* 31.2* 31.9*  MCV 93.8  --  94.3 94.9  PLT 181  --  149* 161   Cardiac Enzymes:  Recent Labs Lab 06/02/15 1210  TROPONINI 0.03    Recent Results (from the past 240 hour(s))  Blood culture (routine x 2)     Status: None   Collection Time: 06/02/15 12:43 PM  Result Value Ref Range Status   Specimen Description BLOOD RIGHT ANTECUBITAL DRAWN BY RN  Final   Special Requests BOTTLES DRAWN AEROBIC AND ANAEROBIC [place]  Final   Culture NO GROWTH 5 DAYS  Final   Report Status 06/07/2015 FINAL  Final  Blood culture (routine x 2)     Status: None   Collection Time: 06/02/15 12:47 PM  Result Value Ref Range Status   Specimen Description BLOOD LEFT  ANTECUBITAL DRAWN BY RN  Final   Special Requests BOTTLES DRAWN AEROBIC AND ANAEROBIC [place]  Final   Culture NO GROWTH 5 DAYS  Final   Report Status 06/07/2015 FINAL  Final     Scheduled Meds: . azithromycin  500 mg Oral Daily  . cefTRIAXone (ROCEPHIN)  IV  2 g Intravenous Q24H  . feeding supplement (ENSURE ENLIVE)  237 mL Oral BID BM  . guaiFENesin  1,200 mg Oral BID  . ipratropium-albuterol  3 mL Nebulization TID  . methylphenidate  10 mg Oral q morning - 10a  . morphine  30 mg Oral Q12H  . pantoprazole  40 mg Oral Daily  . potassium chloride SA  20 mEq Oral BID  . rivaroxaban  20 mg Oral Q supper  . tamoxifen  20 mg Oral Daily  . venlafaxine XR  150 mg Oral Q breakfast   Continuous Infusions:   Active Problems:   Depression   Breast cancer,  stage 4 ([place])   HCAP (healthcare-associated pneumonia)   Pneumonia   Metastatic breast cancer ([place])    Time spent: 25  minutes   Lashandra Arauz. MD [facility] Pager [phone]. If 7PM-7AM, please contact night-coverage at [email or web address], password [facility] 06/07/2015, 10:51 AM  LOS: 5 days     By signing my name below, I, Rosalie Doctor, attest that this documentation has been prepared under the direction and in the presence of [facility]. MD Electronically Signed: Rosalie Doctor, Scribe. 06/07/2015  10:58am  I, Dr. Kathie Dike, personally performed the services described in this documentaiton. All medical record entries made by the scribe were at my direction and in my presence. I have reviewed the chart and agree that the record reflects my personal performance and is accurate and complete  Kathie Dike, MD, 06/07/2015 11:12 AM

## 2015-06-07 NOTE — Care Management (Signed)
Discussed case with attending physician Dr Roderic Palau. Patient still wheezing and slow to improve. Remains on O2 which is not baseline. Started on steroids today.

## 2015-06-08 DIAGNOSIS — K219 Gastro-esophageal reflux disease without esophagitis: Secondary | ICD-10-CM | POA: Insufficient documentation

## 2015-06-08 DIAGNOSIS — J189 Pneumonia, unspecified organism: Secondary | ICD-10-CM | POA: Insufficient documentation

## 2015-06-08 MED ORDER — HEPARIN SOD (PORK) LOCK FLUSH 100 UNIT/ML IV SOLN
500.0000 [IU] | INTRAVENOUS | Status: DC | PRN
  Filled 2015-06-08: qty 5

## 2015-06-08 MED ORDER — GUAIFENESIN ER 600 MG PO TB12: 1200.0000 mg | ORAL_TABLET | Freq: Two times a day (BID) | ORAL | Status: DC

## 2015-06-08 MED ORDER — IPRATROPIUM-ALBUTEROL 0.5-2.5 (3) MG/3ML IN SOLN: 3.0000 mL | Freq: Four times a day (QID) | RESPIRATORY_TRACT | Status: DC | PRN

## 2015-06-08 MED ORDER — PREDNISONE 10 MG PO TABS: ORAL_TABLET | ORAL | Status: DC

## 2015-06-08 NOTE — Progress Notes (Signed)
Pt discharged home today per Dr. Roderic Palau.  Pt's port to right chest flushed with 10 cc's NS and 500 cc's heparin flush per protocol.  Pt's VSS.  Pt provided with home medication list, discharge instructions and prescriptions.  Verbalized understanding.  Pt left floor via WC in stable condition accompanied by NT.

## 2015-06-08 NOTE — Progress Notes (Signed)
SATURATION QUALIFICATIONS: (This note is used to comply with regulatory documentation for home oxygen)  Patient Saturations on Room Air at Rest = 100%  Patient Saturations on Room Air while Ambulating = 91-98%  Patient Saturations on N/A Liters of oxygen while Ambulating = N/A%  Please briefly explain why patient needs home oxygen:  Pt does NOT qualify for home O2.

## 2015-06-11 ENCOUNTER — Encounter (HOSPITAL_COMMUNITY): Payer: Self-pay | Admitting: Hematology & Oncology

## 2015-06-11 ENCOUNTER — Encounter (HOSPITAL_BASED_OUTPATIENT_CLINIC_OR_DEPARTMENT_OTHER): Payer: 59 | Admitting: Hematology & Oncology

## 2015-06-11 ENCOUNTER — Encounter: Payer: Self-pay | Admitting: *Deleted

## 2015-06-11 VITALS — BP 137/75 | HR 84 | Temp 98.2°F | Resp 20 | Wt 251.0 lb

## 2015-06-11 DIAGNOSIS — J181 Lobar pneumonia, unspecified organism: Secondary | ICD-10-CM

## 2015-06-11 DIAGNOSIS — I82621 Acute embolism and thrombosis of deep veins of right upper extremity: Secondary | ICD-10-CM

## 2015-06-11 DIAGNOSIS — I82B11 Acute embolism and thrombosis of right subclavian vein: Secondary | ICD-10-CM

## 2015-06-11 DIAGNOSIS — R530 Neoplastic (malignant) related fatigue: Secondary | ICD-10-CM

## 2015-06-11 DIAGNOSIS — G4700 Insomnia, unspecified: Secondary | ICD-10-CM

## 2015-06-11 DIAGNOSIS — I82C11 Acute embolism and thrombosis of right internal jugular vein: Secondary | ICD-10-CM

## 2015-06-11 DIAGNOSIS — C7951 Secondary malignant neoplasm of bone: Secondary | ICD-10-CM | POA: Diagnosis not present

## 2015-06-11 DIAGNOSIS — C50919 Malignant neoplasm of unspecified site of unspecified female breast: Secondary | ICD-10-CM

## 2015-06-11 DIAGNOSIS — R5382 Chronic fatigue, unspecified: Secondary | ICD-10-CM

## 2015-06-11 DIAGNOSIS — C7931 Secondary malignant neoplasm of brain: Secondary | ICD-10-CM | POA: Diagnosis not present

## 2015-06-11 DIAGNOSIS — R05 Cough: Secondary | ICD-10-CM

## 2015-06-11 DIAGNOSIS — Z0000 Encounter for general adult medical examination without abnormal findings: Secondary | ICD-10-CM

## 2015-06-11 DIAGNOSIS — C787 Secondary malignant neoplasm of liver and intrahepatic bile duct: Secondary | ICD-10-CM

## 2015-06-11 DIAGNOSIS — F419 Anxiety disorder, unspecified: Secondary | ICD-10-CM

## 2015-06-11 DIAGNOSIS — J189 Pneumonia, unspecified organism: Secondary | ICD-10-CM

## 2015-06-11 MED ORDER — ZOLPIDEM TARTRATE 10 MG PO TABS: 10.0000 mg | ORAL_TABLET | Freq: Every evening | ORAL | Status: DC | PRN

## 2015-06-11 MED ORDER — MORPHINE SULFATE ER 30 MG PO TBCR: 30.0000 mg | EXTENDED_RELEASE_TABLET | Freq: Two times a day (BID) | ORAL | Status: DC

## 2015-06-11 MED ORDER — METHYLPHENIDATE HCL 10 MG PO TABS: ORAL_TABLET | ORAL | Status: DC

## 2015-06-11 NOTE — Progress Notes (Signed)
[place], MD [name] [id number]    Breast cancer, stage 4 ([place])   04/25/2014 Initial Diagnosis Breast cancer, stage 4   04/25/2014 Imaging CT abdomen/pelvia with widespread metastatic disease to the liver, multiple lytic lesions throughout spine and pelvis. No FX or epidural tumor identified   04/26/2014 Imaging CT head unremarkable   04/26/2014 Imaging CT chest with no lung mass or pulmonary nodules, no adenopathy. Lytic bone lesions, right 2nd rib   04/27/2014 Initial Biopsy U/S guided liver biopsy, lesion in anterior and inferior left hepatic lobe biopsied   04/27/2014 Pathology Results Metastatic adenocarcinoma, CK7, ER+, patchy positivity with PR. Possible primary includes breast, less likely gynecologic   05/15/2014 Mammogram BI-RADS CATEGORY  2: Benign Finding(s)   05/16/2014 PET scan 1. Intensely hypermetabolic hepatic metastasis. 2. Widespread hypermetabolic skeletal lesions. 3. No primary adenocarcinoma identified by FDG PET imaging.   05/19/2014 Imaging [facility]- Left ventricular ejection fracture greater than 70%.   05/21/2014 Breast MRI No suspicious masses or enhancement within the breasts. No axillary adenopathy.   05/22/2014 - 07/03/2014 Antibody Plan Herceptin/Perjeta/Tamoxifen   06/12/2014 - 07/03/2014 Chemotherapy Taxotere added secondary to persistent abdominal and back pain   06/17/2014 - 06/19/2014 Hospital Admission Neutropenia, fever, diarrhea, nausea, vomiting   06/20/2014 - 07/10/2014 Radiation Therapy [name] 12 fractions to L3-S3 (30 Gy) and left scapula (20 Gy).    07/03/2014 Adverse Reaction Perjeta- induced diarrhea.  Perjeta discontinued   07/16/2014 - 07/20/2014 Hospital Admission Electrolyte abnormalities, and diarrhea.  Suspect Perjeta-induced diarrhea.  Negative GI work-up.   07/24/2014 -  Chemotherapy Herceptin/Tamoxifen/Xgeva   08/21/2014 Imaging [facility]- Left ventricular ejection fraction equals 71%.   08/24/2014 PET scan Dramatic reduction in  metabolic activity of the widespread liver metastasis. Liver metastasis now have metabolic activity equal to background normal liver activity. Liver has a nodular contour. Marked reduction in metabolic activity of skeletal lesions..   10/05/2014 Progression Widespread metastatic disease to the brain as described. Between 20 and 30 intracranial metastatic deposits are now seen. No midline shift or incipient herniation   10/09/2014 - 10/26/2014 Radiation Therapy Whole Brain XRT   11/14/2014 Imaging [facility]- LVEF 67%   02/13/2015 Imaging [facility]- LVEF 59%   02/15/2015 Treatment Plan Change Due to declining LVEF, will hold Herceptin per PI guidelines.   04/12/2015 -  Chemotherapy Herceptin restarted   Stage IV adenocarcinoma with metastases to liver and bone  Had last mammogram in [name] this year, [name]  Colonoscopy 05/18/2013  Breast Reduction 03/17/2011  CURRENT THERAPY: Herceptin/Tamoxifen/XGEVA   INTERVAL HISTORY: [name] 50 y.o. female returns for follow-up of stage IV adenocarcinoma of the breast, ER+, HER 2 + disease. She continues on Tamoxifen and XGEVA and Herceptin.  [name] returns today with her husband [name]. She had pneumonia over the past week, and at the start of her appointment, comments "I'm tired." She mentions that she just got home from her stay in the hospital on Saturday night. She says she was here in the hospital at [facility] from Sunday to Saturday, under [name]. Prior to her stay in the hospital, she was on a cruise with her husband [name]. They got home on Friday morning, and she comments that she fell ill on Saturday afternoon. She says she started coughing and that "it didn't feel right." On Sunday morning, her husband said "you've been coughing all night, we're going to the hospital just to check." At that time, they did an X-ray,  and had to order a CT to confirm pneumonia.  In general, she says that her cruise was wonderful. She notes that, in an unusual  turn of events, she fell once on the cruise. She remarks that she thinks her fall was caused by accidentally taking an abnormal amount of medication, and her husband confirms that [name] was disoriented, "talking like she was talking to her sister, and her sister was there," which of course she wasn't. [name] mentions that, after the fact, she didn't remember falling either. She confirms that nothing like that happened again, adding that she hasn't even been dizzy lately. She feels that any similar symptoms she's been having recently are due to drainage from her pneumonia.  Both [name] and her husband report lots of coughing still. They started breathing treatments for [name] yesterday afternoon, and [name] says she's had three breathing treatments so far.  Notable in terms of her mental state, her best friend died of a heart attack unexpectedly on Monday, just a couple of days after she got home from her cruise. She says that she had just spoken to him on Friday night, right after getting home from her cruise, and comments that his death "has been bothering her." She says "he was supposed to do my funeral."   In terms of upcoming MRI, she says she "kind of panicked in her mind" before the MRI last time, and wonders if there is anything she can take before the MRI next time to control this (xanax, anti-anxiety). She also wants to know if she can have her herceptin on Thursday, and was advised that she should be fine to have it.  When asked about her bowels lately, she says that at first she was throwing up and had diarrhea. Now she says her stool is "just loose," but wouldn't say that it's diarrhea.  During her physical exam, she comments that she has a little discomfort in her lower ribcage/upper abdomen.   MEDICAL HISTORY: Past Medical History  Diagnosis Date  . PONV (postoperative nausea and vomiting)     pt states scope patch does well  . Anemia   . Depression   . Anxiety   . Headache(784.0)      has migraines - medication controls  . Family history of prostate cancer   . Hot flashes due to tamoxifen 09/25/2014  . Breast cancer ([place]) 04/2014    Presumed dx; stage 4 w/ mets to bone and liver, brain lesions  . Bony metastasis ([place])   . S/P small bowel resection   . Drug-induced cardiomyopathy ([place]) 02/15/2015    has Hyperlipidemia; Depression; Breast cancer, stage 4 ([place]); Bone metastases ([place]); DVT (deep venous thrombosis) ([place]); GERD (gastroesophageal reflux disease); Family history of prostate cancer; Genetic testing; Hot flashes due to tamoxifen; Brain metastases ([place]); Drug-induced cardiomyopathy ([place]); HCAP (healthcare-associated pneumonia); Pneumonia; Metastatic breast cancer ([place]); CAP (community acquired pneumonia); and Esophageal reflux on her problem list.      has No Known Allergies.  We administered pneumococcal 23 valent vaccine.  SURGICAL HISTORY: Past Surgical History  Procedure Laterality Date  . Right rotator cuff      2002  . Neck fusion      2003  . Laparoscopic cholecystectomy      2004  . Right knee arthroscopy      2005  . Appendectomy      2008  . Abdominal hysterectomy    . Breast reduction surgery  03/17/2011    Procedure: MAMMARY REDUCTION BILATERAL (BREAST);  Surgeon: Mary A Contogiannis;  Location: [facility];  Service: Plastics;  Laterality: Bilateral;  . Colonoscopy N/A 07/13/2013    Procedure: COLONOSCOPY;  Surgeon: Rogene Houston, MD;  [facility];  Service: Endoscopy;  Laterality: N/A;  930  . Liver biopsy  04/2014  . Portacath placement    . Esophagogastroduodenoscopy N/A 05/25/2014    Procedure: ESOPHAGOGASTRODUODENOSCOPY (EGD);  Surgeon: Rogene Houston, MD;  [facility];  Service: Endoscopy;  Laterality: N/A;  155  . Laparoscopic appendectomy    . Colonoscopy N/A 11/26/2014    Procedure: COLONOSCOPY;  Surgeon: Rogene Houston, MD;  [facility];  Service: Endoscopy;  Laterality: N/A;  730     SOCIAL HISTORY: Social History   Social History  . Marital Status: Married    Spouse Name: N/A  . Number of Children: N/A  . Years of Education: N/A   Occupational History  . Not on file.   Social History Main Topics  . Smoking status: Former Smoker    Types: Cigarettes    Quit date: 08/20/1994  . Smokeless tobacco: Never Used  . Alcohol Use: Yes     Comment: Occasionally  . Drug Use: No  . Sexual Activity: Yes    Birth Control/ Protection: Surgical   Other Topics Concern  . Not on file   Social History Narrative    FAMILY HISTORY: Family History  Problem Relation Age of Onset  . Diabetes Father   . Heart attack Maternal Grandmother 30    multiple over lifetime.  . Cancer Maternal Grandmother 37    NOS  . Prostate cancer Maternal Grandfather     dx in his 69s  . Lung cancer Paternal Grandfather     dx <50  . Lymphoma Maternal Aunt     dx in her 58s  . Melanoma Cousin 65    maternal first cousin  . Brain cancer Cousin     paternal first cousin dx under 68  . Prostate cancer Other     MGF's father  . Colon cancer Other     MGM's mother  Her son lives in [place]. Her daughter lives in [place].  Review of Systems  Constitutional: Negative for fever, chills, weight loss.  Increase in appetite. HENT: . Negative for congestion, hearing loss, sore throat and tinnitus.   Eyes: Negative for double vision, pain and discharge.  Respiratory:  Negative for hemoptysis, sputum production, shortness of breath.   Wheezing and coughing due to her recent pneumonia. Cardiovascular: Negative for chest pain, palpitations, claudication, and PND.  Gastrointestinal: Negative for heartburn, abdominal pain, diarrhea, constipation, blood in stool and melena, nausea, vomiting. Genitourinary:Negative for dysuria, urgency, frequency and hematuria.  Musculoskeletal: Positive for joint pain, shoulder pain, and back pain. Negative for myalgias and falls.  Skin: Negative for  itching and rash.  Neurological: . Negative for tingling, tremors, speech change, focal weakness, seizures, loss of consciousness, weakness. Headaches or blurry vision Endo/Heme/Allergies: Does not bruise/bleed easily.  Psychiatric/Behavioral: Negative for suicidal ideas, memory loss and substance abuse, nervous/axiety, depression.  14 point review of systems was performed and is negative except as detailed under history of present illness and above   PHYSICAL EXAMINATION  ECOG PERFORMANCE STATUS: 1 - Symptomatic but completely ambulatory  Filed Vitals:   06/11/15 1045  BP: 137/75  Pulse: 84  Temp: 98.2 F (36.8 C)  Resp: 20    Physical Exam  Constitutional: She is oriented to person, place,  and time and well-developed, well-nourished, and in no distress. Facial Cushingoid features.  Mood is good. Her cheeks appear thinner. She still has a buffalo hump on her back. HENT:  Head: Normocephalic and atraumatic. Hair regrowth. Nose: Nose normal.  Mouth/Throat:  Negative Eyes: Conjunctivae and EOM are normal. Pupils are equal, round, and reactive to light. Right eye exhibits no discharge. Left eye exhibits no discharge. No scleral icterus.  Neck: Normal range of motion. No tracheal deviation present. No thyromegaly present.  Cardiovascular: Normal rate, regular rhythm and normal heart sounds.  Exam reveals no gallop and no friction rub.   No murmur heard. Pulmonary/Chest: Effort normal and breath sounds normal. She has no wheezes. She has no rales. Coughing due to recent pneumonia. Abdominal: Soft. Bowel sounds are normal. She exhibits no distension and no mass. There is no tenderness. There is no rebound and no guarding.  Musculoskeletal: Normal range of motion. Lymphadenopathy:    She has no cervical adenopathy.  Neurological: She is alert and oriented to person, place, and time. She has normal reflexes. No cranial nerve deficit. Gait normal. Coordination normal.  Skin: Skin is warm  and dry. No rash noted.  Psychiatric: Mood, memory, affect and judgment normal.  Nursing note and vitals reviewed.   LABORATORY DATA: I have reviewed the data as listed.  CBC    Component Value Date/Time   WBC 3.4* 06/07/2015 0634   RBC 3.36* 06/07/2015 0634   HGB 10.4* 06/07/2015 0634   HCT 31.9* 06/07/2015 0634   PLT 161 06/07/2015 0634   MCV 94.9 06/07/2015 0634   MCH 31.0 06/07/2015 0634   MCHC 32.6 06/07/2015 0634   RDW 13.2 06/07/2015 0634   LYMPHSABS 0.8 05/23/2015 1118   MONOABS 0.5 05/23/2015 1118   EOSABS 0.1 05/23/2015 1118   BASOSABS 0.0 05/23/2015 1118   CMP     Component Value Date/Time   NA 140 06/07/2015 0634   K 4.0 06/07/2015 0634   CL 107 06/07/2015 0634   CO2 27 06/07/2015 0634   GLUCOSE 104* 06/07/2015 0634   BUN <5* 06/07/2015 0634   CREATININE 0.53 06/07/2015 0634   CALCIUM 8.1* 06/07/2015 0634   PROT 5.2* 06/03/2015 0635   ALBUMIN 2.9* 06/03/2015 0635   AST 24 06/03/2015 0635   ALT 21 06/03/2015 0635   ALKPHOS 30* 06/03/2015 0635   BILITOT 0.4 06/03/2015 0635   GFRNONAA >60 06/07/2015 0634   GFRAA >60 06/07/2015 0634    RADIOLOGY:  06/02/2015  Study Result     CLINICAL DATA: Right side chest pain, history of breast cancer  EXAM: CT ANGIOGRAPHY CHEST WITH CONTRAST  TECHNIQUE: Multidetector CT imaging of the chest was performed using the standard protocol during bolus administration of intravenous contrast. Multiplanar CT image reconstructions and MIPs were obtained to evaluate the vascular anatomy.  CONTRAST: 177m OMNIPAQUE IOHEXOL 350 MG/ML SOLN  COMPARISON: 03/29/2015  FINDINGS: Sagittal images of the spine shows again small sclerotic bone lesions within thoracic spine and sternum consistent with treated metastasis.  The visualized upper abdomen shows fatty infiltration of the liver. Stable low-density lesion in right hepatic lobe posteriorly measures 3.7 cm consistent with stable metastatic  disease.  Images of the thoracic inlet are unremarkable. Central airways are patent. A precarinal lymph node measures 8 mm not pathologic by size criteria. No pulmonary embolus is noted. There is no pericardial effusion. Heart size within normal limits. There is patchy infiltrate in right lower lobe superior segment and basal segment. Findings are consistent with early  pneumonia. Stable mild nodular contour of the liver. Stable low-density lesion within spleen.  There is no pulmonary edema.  Review of the MIP images confirms the above findings.  IMPRESSION: 1. No pulmonary embolus is noted. 2. There is patchy infiltrate/pneumonia right lower lobe. 3. Again noted fatty infiltration of the liver and subtle nodular liver contour. Stable metastatic lesion in right hepatic lobe posteriorly measures 3.7 cm. 4. Stable small sclerotic bone lesions. 5. No pulmonary edema. 6. These results were called by telephone at the time of interpretation on 06/02/2015 at 3:19 pm to Dr. Ezequiel Essex , who verbally acknowledged these results.   Electronically Signed  By: Lahoma Crocker M.D.  On: 06/02/2015 15:20    06/02/2015  Study Result     CLINICAL DATA: Headache and history of breast carcinoma with treated skull base and intracranial metastatic lesions.  EXAM: CT HEAD WITHOUT CONTRAST  TECHNIQUE: Contiguous axial images were obtained from the base of the skull through the vertex without intravenous contrast.  COMPARISON: MRI of the brain on 03/29/2015 and head CT on 03/22/2015.  FINDINGS: By unenhanced CT, the only visualized metastatic lesion is a stable 7 mm sclerotic lesion at the skull base within the left clivus. Previously visualized cerebral metastases have been shown to have responded to radiation therapy. These are nonvisualized by unenhanced CT and there is no evidence of acute hemorrhage, visible infarction, mass effect or hydrocephalus. No extra-axial  fluid collections are identified.  IMPRESSION: No acute findings. The only visible metastatic lesion by unenhanced CT is a stable 7 mm sclerotic lesion in the left clivus.   Electronically Signed  By: Aletta Edouard M.D.  On: 06/02/2015 15:11   03/29/2015  Study Result     CLINICAL DATA: Stage IV breast cancer. Recent fall with abnormal head CT suggesting skullbase metastasis.  EXAM: MRI HEAD WITHOUT AND WITH CONTRAST  TECHNIQUE: Multiplanar, multiecho pulse sequences of the brain and surrounding structures were obtained without and with intravenous contrast.  CONTRAST: 49m MULTIHANCE GADOBENATE DIMEGLUMINE 529 MG/ML IV SOLN  COMPARISON: CT 03/22/2015. MRI 12/24/2014.  FINDINGS: Diffusion imaging does not show any acute or subacute infarction. 6 mm metastatic focus to the left of midline in the clivus is stable over time. No extraosseous extension.  Known brain metastases all continue to get smaller. The largest lesion in the caudate head on the right has decreased in size from 5.4 mm to 3.2 mm. A few other punctate metastases are seen within the cerebral hemispheres and within the cerebellum, all getting smaller.  No evidence of intracranial hemorrhage, hydrocephalus or extra-axial collection. No new or progressive lesions. Small amount of fluid in the mastoid air cells bilaterally.  IMPRESSION: No acute intracranial finding. Continued improvement with reduction in size of all intracranial metastatic lesions. Many are no longer visible at all. The largest lesion in the right caudate head has decreased in size from 5.4 mm to 3.2 mm.  Small metastasis in the left side of the clivus is stable.   Electronically Signed  By: MNelson ChimesM.D.  On: 03/29/2015 16:56   Study Result     CLINICAL DATA: Subsequent treatment strategy for breast cancer.  EXAM: NUCLEAR MEDICINE PET SKULL BASE TO THIGH  TECHNIQUE: 15.5 mCi F-18 FDG was  injected intravenously. Full-ring PET imaging was performed from the skull base to thigh after the radiotracer. CT data was obtained and used for attenuation correction and anatomic localization.  FASTING BLOOD GLUCOSE: Value: 88 mg/dl  COMPARISON: 11/01/2014.  FINDINGS: NECK  No  hypermetabolic lymph nodes in the neck.  CHEST  No hypermetabolic mediastinal or hilar nodes. No suspicious pulmonary nodules on the CT scan.  Right Port-A-Cath tip is at the distal SVC level.  ABDOMEN/PELVIS  No abnormal hypermetabolic activity within the liver, pancreas, adrenal glands, or spleen. No hypermetabolic lymph nodes in the abdomen or pelvis.  Liver contour remains nodular with parenchyma heterogeneous in appearance. As described previously this remains compatible with treated hepatic metastases.  9 mm nodule in the left anterior abdominal wall subcutaneous fat shows mild hypermetabolism. This would be an unusual location for metastatic disease and is felt to be most likely related to infection/inflammation or injection granuloma.  SKELETON  No focal hypermetabolic activity to suggest skeletal metastasis. Bone windows show innumerable sclerotic lesions throughout the skeleton which are stable. Index lesion in the L4 vertebral body measures 16 mm today, unchanged since previous. The fracture of the left acromion is again noted and FDG uptake in the region of the fracture is presumably related to healing.  IMPRESSION: 1. Stable exam. No definite evidence for hypermetabolic metastases. There is a small nodule in the left anterior abdominal wall subcutaneous fat that shows low level FDG uptake, likely related to small focus of infection/inflammation or injection granuloma. Continued attention to this area on followup is recommended. 2. Healing fracture of the left acromion again noted. Uptake in this region compatible with granulation. 3. Nodular heterogeneous  liver compatible with treated metastases. No focal hypermetabolism today to suggest hypermetabolic liver disease.   Electronically Signed  By: Misty Stanley M.D.  On: 03/29/2015 09:50   Study Result     CLINICAL DATA: Fall yesterday, history of breast carcinoma with metastatic disease to the brain  EXAM: CT HEAD WITHOUT CONTRAST  TECHNIQUE: Contiguous axial images were obtained from the base of the skull through the vertex without intravenous contrast.  COMPARISON: 12/24/2014 04/26/2014  FINDINGS: The bony calvarium is intact some bony sclerosis in the clivus just to the left of the midline may represent bony metastatic disease. No findings to suggest acute hemorrhage, acute infarction or space-occupying mass lesion are noted. The known brain metastatic lesions are not well appreciated on this study.  IMPRESSION: Possible bony metastatic disease in the skullbase.  No acute abnormality is seen.   Electronically Signed  By: Inez Catalina M.D.  On: 03/22/2015 13:31   CLINICAL DATA: Breast cancer, cardiotoxic chemotherapy  EXAM: NUCLEAR MEDICINE CARDIAC BLOOD POOL IMAGING ([facility])  TECHNIQUE: Cardiac multi-gated acquisition was performed at rest following intravenous injection of Tc-45mlabeled red blood cells.  RADIOPHARMACEUTICALS: 23.6 mCi [id number]-vitro labeled autologous red blood cells IV  COMPARISON: 04/01/2015  FINDINGS: Calculated LEFT ventricular ejection fraction is 58%, within normal range.  This is not significantly changed from the 60% on the previous exam.  Wall motion analysis of the LEFT ventricle in 3 projections demonstrates normal LEFT ventricular wall motion.  IMPRESSION: Normal LEFT ventricular ejection fraction of 58% not significantly changed from the 60% on 04/01/2015.  Normal LV wall motion.   Electronically Signed  By: MaLavonia Dana.D.  On: 05/09/2015 11:01   ASSESSMENT and THERAPY PLAN:    Stage IV ER positive, HER-2 positive carcinoma of the breast Widespread Brain Metastases End of Life issues Insomnia Anxiety UE DVT, RIJ and R subclavian Cancer related Fatigue Decline in EF, Herceptin held. Herceptin restarted on 04/12/2015   I will order refills on her morphine, ritalin, and ambien. She will return at the end of February for repeat imaging studies. We will see  her back 3 weeks from Thursday.  She will also need chest X-rays to follow up on her pneumonia in 3-4 weeks, which we will set up . If her coughing doesn't get any better within a week, she will let us know. We will put her through another short course of steroids, if needed. I'll also write her for some cough syrup from [place].  Orders Placed This Encounter  Procedures  . DG Chest 2 View    Standing Status: Future     Number of Occurrences:      Standing Expiration Date: 06/10/2016    Order Specific Question:  Reason for Exam (SYMPTOM  OR DIAGNOSIS REQUIRED)    Answer:  pneumonia    Order Specific Question:  Is the patient pregnant?    Answer:  No    Order Specific Question:  Preferred imaging location?    Answer:  [facility]  . MR Brain W Contrast    Standing Status: Future     Number of Occurrences:      Standing Expiration Date: 06/10/2016    Order Specific Question:  If indicated for the ordered procedure, I authorize the administration of contrast media per Radiology protocol    Answer:  Yes    Order Specific Question:  Reason for Exam (SYMPTOM  OR DIAGNOSIS REQUIRED)    Answer:  restaging stage IV breast cancer, brain mets    Order Specific Question:  Preferred imaging location?    Answer:  [facility]    Order Specific Question:  Does the patient have a pacemaker or implanted devices?    Answer:  No    Order Specific Question:  What is the patient's sedation requirement?    Answer:  Anti-anxiety  . NM PET Image Restag (PS) Skull Base To Thigh    Standing Status:  Future     Number of Occurrences:      Standing Expiration Date: 06/10/2016    Order Specific Question:  Reason for Exam (SYMPTOM  OR DIAGNOSIS REQUIRED)    Answer:  restaging stage IV breast cancer, brain mets    Order Specific Question:  Is the patient pregnant?    Answer:  No    Order Specific Question:  Preferred imaging location?    Answer:  [facility]    Order Specific Question:  If indicated for the ordered procedure, I authorize the administration of a radiopharmaceutical per Radiology protocol    Answer:  Yes   All questions were answered. The patient knows to call the clinic with any problems, questions or concerns. We can certainly see the patient much sooner if necessary.   This note was signed electronically.  This document serves as a record of services personally performed by Ancil Linsey, MD. It was created on her behalf by Toni Amend, a trained medical scribe. The creation of this record is based on the scribe's personal observations and the provider's statements to them. This document has been checked and approved by the attending provider.  I have reviewed the above documentation for accuracy and completeness, and I agree with the above.  Kel[place] Fam. Whitney Muse, MD

## 2015-06-11 NOTE — Patient Instructions (Addendum)
[place] at [facility] Discharge Instructions  RECOMMENDATIONS MADE BY THE CONSULTANT AND ANY TEST RESULTS WILL BE SENT TO YOUR REFERRING PHYSICIAN.   Exam and discussion by Dr Whitney Muse today You are due for imaging about the end of February  Chest x-ray in 2 weeks Cough drops are ok. Please call us if your cough isnt any better. [facility] cough syrup   Refill on morphine, ritalin, and ambien  Herceptin and x-geva as scheduled on Thursday.  Return to see the doctor in 3 weeks Please call the clinic if you have any questions or concerns   Thank you for choosing [place] at [facility] to provide your oncology and hematology care.  To afford each patient quality time with our provider, please arrive at least 15 minutes before your scheduled appointment time.   Beginning January 23rd 2017 lab work for the [facility] will be done in the  Main lab at [facility] on 1st floor. If you have a lab appointment with the [facility] please come in thru the  Main Entrance and check in at the main information desk  You need to re-schedule your appointment should you arrive 10 or more minutes late.  We strive to give you quality time with our providers, and arriving late affects you and other patients whose appointments are after yours.  Also, if you no show three or more times for appointments you may be dismissed from the clinic at the providers discretion.     Again, thank you for choosing [facility].  Our hope is that these requests will decrease the amount of time that you wait before being seen by our physicians.       _____________________________________________________________  Should you have questions after your visit to [facility], please contact our office at [phone] between the hours of 8:30 a.m. and 4:30 p.m.  Voicemails left after 4:30 p.m. will not be returned until the following business  day.  For prescription refill requests, have your pharmacy contact our office.

## 2015-06-11 NOTE — Progress Notes (Signed)
[place] Clinical Social Work  Clinical Social Work was referred by [place] rounding for re-assessment of psychosocial needs due to advanced cancer and recent pneumonia. Clinical Social Worker met with patient and husband to provide supportive listening at [facility] to offer support and assess for needs.  Pt and husband shared their wonderful experiences of their cruise earlier this month. They had a wonderful time together and are eager to attempt another trip in the future. They deny current concerns, but agree to reach out as needs arise. Pt is eager to help assist with the next [employer] [facility] Support Group.   Clinical Social Work interventions: Supportive listening  [place], [place] Tuesdays   [phone]

## 2015-06-13 ENCOUNTER — Encounter (HOSPITAL_COMMUNITY): Payer: Self-pay

## 2015-06-13 ENCOUNTER — Encounter (HOSPITAL_COMMUNITY): Payer: 59 | Attending: Hematology & Oncology

## 2015-06-13 ENCOUNTER — Encounter (HOSPITAL_COMMUNITY): Payer: 59

## 2015-06-13 VITALS — BP 121/56 | HR 93 | Temp 98.5°F | Resp 18 | Wt 247.2 lb

## 2015-06-13 DIAGNOSIS — Z5112 Encounter for antineoplastic immunotherapy: Secondary | ICD-10-CM

## 2015-06-13 DIAGNOSIS — C50919 Malignant neoplasm of unspecified site of unspecified female breast: Secondary | ICD-10-CM

## 2015-06-13 DIAGNOSIS — C7951 Secondary malignant neoplasm of bone: Secondary | ICD-10-CM | POA: Insufficient documentation

## 2015-06-13 DIAGNOSIS — C229 Malignant neoplasm of liver, not specified as primary or secondary: Secondary | ICD-10-CM | POA: Insufficient documentation

## 2015-06-13 LAB — CBC WITH DIFFERENTIAL/PLATELET
BASOS PCT: 0 %
Basophils Absolute: 0 10*3/uL (ref 0.0–0.1)
EOS ABS: 0.2 10*3/uL (ref 0.0–0.7)
EOS PCT: 2 %
HCT: 41.3 % (ref 36.0–46.0)
Hemoglobin: 13.6 g/dL (ref 12.0–15.0)
LYMPHS ABS: 1.7 10*3/uL (ref 0.7–4.0)
Lymphocytes Relative: 18 %
MCH: 30.4 pg (ref 26.0–34.0)
MCHC: 32.9 g/dL (ref 30.0–36.0)
MCV: 92.4 fL (ref 78.0–100.0)
MONOS PCT: 7 %
Monocytes Absolute: 0.6 10*3/uL (ref 0.1–1.0)
Neutro Abs: 6.8 10*3/uL (ref 1.7–7.7)
Neutrophils Relative %: 73 %
PLATELETS: 307 10*3/uL (ref 150–400)
RBC: 4.47 MIL/uL (ref 3.87–5.11)
RDW: 12.9 % (ref 11.5–15.5)
WBC: 9.3 10*3/uL (ref 4.0–10.5)

## 2015-06-13 LAB — COMPREHENSIVE METABOLIC PANEL
ALBUMIN: 3.8 g/dL (ref 3.5–5.0)
ALT: 44 U/L (ref 14–54)
ANION GAP: 11 (ref 5–15)
AST: 39 U/L (ref 15–41)
Alkaline Phosphatase: 45 U/L (ref 38–126)
BUN: 14 mg/dL (ref 6–20)
CHLORIDE: 101 mmol/L (ref 101–111)
CO2: 29 mmol/L (ref 22–32)
Calcium: 9.5 mg/dL (ref 8.9–10.3)
Creatinine, Ser: 0.94 mg/dL (ref 0.44–1.00)
GFR calc non Af Amer: >60 mL/min (ref >60)
GLUCOSE: 126 mg/dL — AB (ref 65–99)
Potassium: 3.5 mmol/L (ref 3.5–5.1)
SODIUM: 141 mmol/L (ref 135–145)
Total Bilirubin: 0.4 mg/dL (ref 0.3–1.2)
Total Protein: 6.9 g/dL (ref 6.5–8.1)

## 2015-06-13 MED ORDER — DIPHENHYDRAMINE HCL 25 MG PO CAPS
50.0000 mg | ORAL_CAPSULE | Freq: Once | ORAL | Status: AC
  Administered 2015-06-13: 50 mg via ORAL

## 2015-06-13 MED ORDER — HEPARIN SOD (PORK) LOCK FLUSH 100 UNIT/ML IV SOLN
INTRAVENOUS | Status: AC
  Filled 2015-06-13: qty 5

## 2015-06-13 MED ORDER — ACETAMINOPHEN 325 MG PO TABS
650.0000 mg | ORAL_TABLET | Freq: Once | ORAL | Status: AC
  Administered 2015-06-13: 650 mg via ORAL

## 2015-06-13 MED ORDER — HEPARIN SOD (PORK) LOCK FLUSH 100 UNIT/ML IV SOLN
500.0000 [IU] | Freq: Once | INTRAVENOUS | Status: AC | PRN
Start: 2015-06-13 — End: 2015-06-13
  Administered 2015-06-13: 500 [IU]

## 2015-06-13 MED ORDER — SODIUM CHLORIDE 0.9 % IV SOLN
Freq: Once | INTRAVENOUS | Status: AC
  Administered 2015-06-13: 12:00:00 via INTRAVENOUS

## 2015-06-13 MED ORDER — DENOSUMAB 120 MG/1.7ML ~~LOC~~ SOLN
120.0000 mg | Freq: Once | SUBCUTANEOUS | Status: AC
  Administered 2015-06-13: 120 mg via SUBCUTANEOUS
  Filled 2015-06-13: qty 1.7

## 2015-06-13 MED ORDER — DIPHENHYDRAMINE HCL 25 MG PO CAPS
ORAL_CAPSULE | ORAL | Status: AC
Start: 2015-06-13 — End: 2015-06-13
  Filled 2015-06-13: qty 2

## 2015-06-13 MED ORDER — ACETAMINOPHEN 325 MG PO TABS
ORAL_TABLET | ORAL | Status: AC
  Filled 2015-06-13: qty 2

## 2015-06-13 MED ORDER — SODIUM CHLORIDE 0.9 % IJ SOLN: 10.0000 mL | INTRAMUSCULAR | Status: DC | PRN

## 2015-06-13 MED ORDER — TRASTUZUMAB CHEMO INJECTION 440 MG
6.0000 mg/kg | Freq: Once | INTRAVENOUS | Status: AC
  Administered 2015-06-13: 672 mg via INTRAVENOUS
  Filled 2015-06-13: qty 32

## 2015-06-13 NOTE — Progress Notes (Signed)
..  [name] presents today for injection per the provider's orders.  xgeva administration without incident; see MAR for injection details.  Patient tolerated procedure well and without incident.  No questions or complaints noted at this time.Tolerated well

## 2015-06-13 NOTE — Patient Instructions (Signed)
..  [facility] Discharge Instructions for Patients Receiving Chemotherapy   Beginning January 23rd 2017 lab work for the [facility] will be done in the  Main lab at [facility] on 1st floor. If you have a lab appointment with the [place] please come in thru the  Main Entrance and check in at the main information desk   Today you received the following chemotherapy agents herceptin Delton See given today [place] due in March     If you develop nausea and vomiting, or diarrhea that is not controlled by your medication, call the clinic.  The clinic phone number is [phone]. Office hours are Monday-Friday 8:30am-5:00pm.  BELOW ARE SYMPTOMS THAT SHOULD BE REPORTED IMMEDIATELY:  *FEVER GREATER THAN 101.0 F  *CHILLS WITH OR WITHOUT FEVER  NAUSEA AND VOMITING THAT IS NOT CONTROLLED WITH YOUR NAUSEA MEDICATION  *UNUSUAL SHORTNESS OF BREATH  *UNUSUAL BRUISING OR BLEEDING  TENDERNESS IN MOUTH AND THROAT WITH OR WITHOUT PRESENCE OF ULCERS  *URINARY PROBLEMS  *BOWEL PROBLEMS  UNUSUAL RASH Items with * indicate a potential emergency and should be followed up as soon as possible. If you have an emergency after office hours please contact your primary care physician or go to the nearest emergency department.  Please call the clinic during office hours if you have any questions or concerns.   You may also contact the Patient Navigator at [phone] should you have any questions or need assistance in obtaining follow up care.

## 2015-06-13 NOTE — Progress Notes (Signed)
See chemo encounter 

## 2015-06-14 LAB — CANCER ANTIGEN 27.29: CA 27.29: 42 U/mL — ABNORMAL HIGH (ref 0.0–38.6)

## 2015-07-01 ENCOUNTER — Other Ambulatory Visit (HOSPITAL_COMMUNITY): Payer: Self-pay | Admitting: Oncology

## 2015-07-04 ENCOUNTER — Encounter (HOSPITAL_COMMUNITY): Payer: 59

## 2015-07-04 ENCOUNTER — Encounter (HOSPITAL_BASED_OUTPATIENT_CLINIC_OR_DEPARTMENT_OTHER): Payer: 59

## 2015-07-04 ENCOUNTER — Ambulatory Visit (HOSPITAL_COMMUNITY): Payer: 59 | Attending: Hematology & Oncology | Admitting: Physical Therapy

## 2015-07-04 ENCOUNTER — Encounter (HOSPITAL_COMMUNITY): Payer: Self-pay | Admitting: Hematology & Oncology

## 2015-07-04 ENCOUNTER — Encounter (HOSPITAL_BASED_OUTPATIENT_CLINIC_OR_DEPARTMENT_OTHER): Payer: 59 | Admitting: Hematology & Oncology

## 2015-07-04 ENCOUNTER — Ambulatory Visit (HOSPITAL_COMMUNITY)
Admission: RE | Admit: 2015-07-04 | Discharge: 2015-07-04 | Disposition: A | Discharge status: Home / Self Care | Payer: 59 | Source: Ambulatory Visit | Attending: Hematology & Oncology | Admitting: Hematology & Oncology

## 2015-07-04 VITALS — BP 114/53 | HR 80 | Temp 98.0°F | Resp 18

## 2015-07-04 VITALS — BP 124/59 | HR 94 | Temp 97.7°F | Resp 18 | Wt 252.0 lb

## 2015-07-04 DIAGNOSIS — J181 Lobar pneumonia, unspecified organism: Secondary | ICD-10-CM

## 2015-07-04 DIAGNOSIS — C7951 Secondary malignant neoplasm of bone: Secondary | ICD-10-CM

## 2015-07-04 DIAGNOSIS — M25612 Stiffness of left shoulder, not elsewhere classified: Secondary | ICD-10-CM | POA: Diagnosis present

## 2015-07-04 DIAGNOSIS — Z5112 Encounter for antineoplastic immunotherapy: Secondary | ICD-10-CM

## 2015-07-04 DIAGNOSIS — C787 Secondary malignant neoplasm of liver and intrahepatic bile duct: Secondary | ICD-10-CM

## 2015-07-04 DIAGNOSIS — Z9181 History of falling: Secondary | ICD-10-CM | POA: Insufficient documentation

## 2015-07-04 DIAGNOSIS — J189 Pneumonia, unspecified organism: Secondary | ICD-10-CM | POA: Insufficient documentation

## 2015-07-04 DIAGNOSIS — I82B11 Acute embolism and thrombosis of right subclavian vein: Secondary | ICD-10-CM

## 2015-07-04 DIAGNOSIS — C50919 Malignant neoplasm of unspecified site of unspecified female breast: Secondary | ICD-10-CM

## 2015-07-04 DIAGNOSIS — R2689 Other abnormalities of gait and mobility: Secondary | ICD-10-CM | POA: Insufficient documentation

## 2015-07-04 DIAGNOSIS — R530 Neoplastic (malignant) related fatigue: Secondary | ICD-10-CM

## 2015-07-04 DIAGNOSIS — M25519 Pain in unspecified shoulder: Secondary | ICD-10-CM

## 2015-07-04 DIAGNOSIS — R531 Weakness: Secondary | ICD-10-CM | POA: Diagnosis present

## 2015-07-04 DIAGNOSIS — M545 Low back pain: Secondary | ICD-10-CM

## 2015-07-04 DIAGNOSIS — R5382 Chronic fatigue, unspecified: Secondary | ICD-10-CM

## 2015-07-04 DIAGNOSIS — C229 Malignant neoplasm of liver, not specified as primary or secondary: Secondary | ICD-10-CM | POA: Diagnosis not present

## 2015-07-04 DIAGNOSIS — R6889 Other general symptoms and signs: Secondary | ICD-10-CM | POA: Diagnosis present

## 2015-07-04 DIAGNOSIS — R269 Unspecified abnormalities of gait and mobility: Secondary | ICD-10-CM | POA: Insufficient documentation

## 2015-07-04 DIAGNOSIS — C7931 Secondary malignant neoplasm of brain: Secondary | ICD-10-CM

## 2015-07-04 DIAGNOSIS — F419 Anxiety disorder, unspecified: Secondary | ICD-10-CM

## 2015-07-04 DIAGNOSIS — I82621 Acute embolism and thrombosis of deep veins of right upper extremity: Secondary | ICD-10-CM

## 2015-07-04 DIAGNOSIS — I82C11 Acute embolism and thrombosis of right internal jugular vein: Secondary | ICD-10-CM

## 2015-07-04 LAB — CBC WITH DIFFERENTIAL/PLATELET
BASOS PCT: 0 %
Basophils Absolute: 0 10*3/uL (ref 0.0–0.1)
EOS ABS: 0.2 10*3/uL (ref 0.0–0.7)
Eosinophils Relative: 3 %
HCT: 37 % (ref 36.0–46.0)
Hemoglobin: 12.1 g/dL (ref 12.0–15.0)
Lymphocytes Relative: 22 %
Lymphs Abs: 1.2 10*3/uL (ref 0.7–4.0)
MCH: 30 pg (ref 26.0–34.0)
MCHC: 32.7 g/dL (ref 30.0–36.0)
MCV: 91.6 fL (ref 78.0–100.0)
MONO ABS: 0.4 10*3/uL (ref 0.1–1.0)
MONOS PCT: 7 %
Neutro Abs: 3.6 10*3/uL (ref 1.7–7.7)
Neutrophils Relative %: 68 %
PLATELETS: 207 10*3/uL (ref 150–400)
RBC: 4.04 MIL/uL (ref 3.87–5.11)
RDW: 13.8 % (ref 11.5–15.5)
WBC: 5.3 10*3/uL (ref 4.0–10.5)

## 2015-07-04 LAB — COMPREHENSIVE METABOLIC PANEL
ALBUMIN: 3.7 g/dL (ref 3.5–5.0)
ALT: 20 U/L (ref 14–54)
ANION GAP: 10 (ref 5–15)
AST: 25 U/L (ref 15–41)
Alkaline Phosphatase: 36 U/L — ABNORMAL LOW (ref 38–126)
BUN: 11 mg/dL (ref 6–20)
CO2: 27 mmol/L (ref 22–32)
Calcium: 9.1 mg/dL (ref 8.9–10.3)
Chloride: 103 mmol/L (ref 101–111)
Creatinine, Ser: 0.7 mg/dL (ref 0.44–1.00)
GFR calc Af Amer: >60 mL/min (ref >60)
GFR calc non Af Amer: >60 mL/min (ref >60)
GLUCOSE: 124 mg/dL — AB (ref 65–99)
POTASSIUM: 3.6 mmol/L (ref 3.5–5.1)
SODIUM: 140 mmol/L (ref 135–145)
TOTAL PROTEIN: 6.6 g/dL (ref 6.5–8.1)
Total Bilirubin: 0.3 mg/dL (ref 0.3–1.2)

## 2015-07-04 MED ORDER — HEPARIN SOD (PORK) LOCK FLUSH 100 UNIT/ML IV SOLN
500.0000 [IU] | Freq: Once | INTRAVENOUS | Status: AC
  Administered 2015-07-04: 500 [IU] via INTRAVENOUS

## 2015-07-04 MED ORDER — SODIUM CHLORIDE 0.9 % IJ SOLN
10.0000 mL | INTRAMUSCULAR | Status: DC | PRN
  Administered 2015-07-04: 10 mL
  Filled 2015-07-04: qty 10

## 2015-07-04 MED ORDER — HEPARIN SOD (PORK) LOCK FLUSH 100 UNIT/ML IV SOLN
INTRAVENOUS | Status: AC
  Filled 2015-07-04: qty 5

## 2015-07-04 MED ORDER — ACETAMINOPHEN 325 MG PO TABS
650.0000 mg | ORAL_TABLET | Freq: Once | ORAL | Status: AC
  Administered 2015-07-04: 650 mg via ORAL

## 2015-07-04 MED ORDER — DENOSUMAB 120 MG/1.7ML ~~LOC~~ SOLN
120.0000 mg | Freq: Once | SUBCUTANEOUS | Status: DC
  Filled 2015-07-04: qty 1.7

## 2015-07-04 MED ORDER — ACETAMINOPHEN 325 MG PO TABS
ORAL_TABLET | ORAL | Status: AC
  Filled 2015-07-04: qty 2

## 2015-07-04 MED ORDER — MORPHINE SULFATE ER 30 MG PO TBCR: 30.0000 mg | EXTENDED_RELEASE_TABLET | Freq: Two times a day (BID) | ORAL | Status: DC

## 2015-07-04 MED ORDER — HEPARIN SOD (PORK) LOCK FLUSH 100 UNIT/ML IV SOLN: 500.0000 [IU] | Freq: Once | INTRAVENOUS | Status: AC | PRN

## 2015-07-04 MED ORDER — DIPHENHYDRAMINE HCL 25 MG PO CAPS
50.0000 mg | ORAL_CAPSULE | Freq: Once | ORAL | Status: AC
  Administered 2015-07-04: 50 mg via ORAL

## 2015-07-04 MED ORDER — TRASTUZUMAB CHEMO INJECTION 440 MG
6.0000 mg/kg | Freq: Once | INTRAVENOUS | Status: AC
  Administered 2015-07-04: 672 mg via INTRAVENOUS
  Filled 2015-07-04: qty 32

## 2015-07-04 MED ORDER — DIPHENHYDRAMINE HCL 25 MG PO CAPS
ORAL_CAPSULE | ORAL | Status: AC
  Filled 2015-07-04: qty 2

## 2015-07-04 MED ORDER — SODIUM CHLORIDE 0.9 % IV SOLN
Freq: Once | INTRAVENOUS | Status: AC
  Administered 2015-07-04: 12:00:00 via INTRAVENOUS

## 2015-07-04 NOTE — Patient Instructions (Addendum)
[place] at [facility] Discharge Instructions  RECOMMENDATIONS MADE BY THE CONSULTANT AND ANY TEST RESULTS WILL BE SENT TO YOUR REFERRING PHYSICIAN.   Exam and discussion by Dr Whitney Muse today [name] as scheduled (next week) X-geva every 28 days Herceptin every 3 weeks Need to get chest x-ray you can do that when you leave Get a PET/MRI scheduled. Take Xanax about 1 hour before your appt time, then once you arrive take another pill. Return to see the doctor in 3 weeks  Please call the clinic if you have any questions or concerns    Thank you for choosing [place] at [facility] to provide your oncology and hematology care.  To afford each patient quality time with our provider, please arrive at least 15 minutes before your scheduled appointment time.   Beginning January 23rd 2017 lab work for the [facility] will be done in the  Main lab at [facility] on 1st floor. If you have a lab appointment with the [place] please come in thru the  Main Entrance and check in at the main information desk  You need to re-schedule your appointment should you arrive 10 or more minutes late.  We strive to give you quality time with our providers, and arriving late affects you and other patients whose appointments are after yours.  Also, if you no show three or more times for appointments you may be dismissed from the clinic at the providers discretion.     Again, thank you for choosing [facility].  Our hope is that these requests will decrease the amount of time that you wait before being seen by our physicians.       _____________________________________________________________  Should you have questions after your visit to [facility], please contact our office at [phone] between the hours of 8:30 a.m. and 4:30 p.m.  Voicemails left after 4:30 p.m. will not be returned until the following business day.  For  prescription refill requests, have your pharmacy contact our office.

## 2015-07-04 NOTE — Therapy (Signed)
[facility] [address] [place], [place], [address] Phone: [phone]   Fax:  [phone]  Physical Therapy Evaluation  Patient Details  Name: [name] MRN: [id number] Date of Birth: [birth year] Referring Provider: Ancil Linsey  Encounter Date: 07/04/2015      PT End of Session - 07/04/15 1120    Visit Number 1   Number of Visits 16   Date for PT Re-Evaluation 08/03/15   Authorization Type united health care   PT Start Time 0800   PT Stop Time 0849   PT Time Calculation (min) 49 min   Activity Tolerance Patient tolerated treatment well   Behavior During Therapy [facility] for tasks assessed/performed      Past Medical History  Diagnosis Date  . PONV (postoperative nausea and vomiting)     pt states scope patch does well  . Anemia   . Depression   . Anxiety   . Headache(784.0)     has migraines - medication controls  . Family history of prostate cancer   . Hot flashes due to tamoxifen 09/25/2014  . Breast cancer ([place]) 04/2014    Presumed dx; stage 4 w/ mets to bone and liver, brain lesions  . Bony metastasis ([place])   . S/P small bowel resection   . Drug-induced cardiomyopathy ([place]) 02/15/2015    Past Surgical History  Procedure Laterality Date  . Right rotator cuff      2002  . Neck fusion      2003  . Laparoscopic cholecystectomy      2004  . Right knee arthroscopy      2005  . Appendectomy      2008  . Abdominal hysterectomy    . Breast reduction surgery  03/17/2011    Procedure: MAMMARY REDUCTION BILATERAL (BREAST);  Surgeon: Mary A Contogiannis;  Location: [facility];  Service: Plastics;  Laterality: Bilateral;  . Colonoscopy N/A 07/13/2013    Procedure: COLONOSCOPY;  Surgeon: Rogene Houston, MD;  [facility];  Service: Endoscopy;  Laterality: N/A;  930  . Liver biopsy  04/2014  . Portacath placement    . Esophagogastroduodenoscopy N/A 05/25/2014    Procedure:  ESOPHAGOGASTRODUODENOSCOPY (EGD);  Surgeon: Rogene Houston, MD;  [facility];  Service: Endoscopy;  Laterality: N/A;  155  . Laparoscopic appendectomy    . Colonoscopy N/A 11/26/2014    Procedure: COLONOSCOPY;  Surgeon: Rogene Houston, MD;  [facility];  Service: Endoscopy;  Laterality: N/A;  730    There were no vitals filed for this visit.  Visit Diagnosis:  Weakness - Plan: PT plan of care cert/re-cert  Neoplastic malignant related fatigue - Plan: PT plan of care cert/re-cert  Abnormality of gait - Plan: PT plan of care cert/re-cert  Stiffness of shoulder joint, left - Plan: PT plan of care cert/re-cert  Decreased activity tolerance - Plan: PT plan of care cert/re-cert  Unstable balance - Plan: PT plan of care cert/re-cert  At risk for falls - Plan: PT plan of care cert/re-cert      Subjective Assessment - 07/04/15 0803    Subjective [name] has came to therapy at this time to try and get energy and get some of her pain in her lower back to subside.     Pertinent History Pt has recently been dischared from the hospital with pneumonia .Pt fractured her Lt shoulder after radiation and still does not have full use of her  arm.  Pt is a 50 y/o female with a diagnosis of Stage IV Breast Cancer, diagnosed in December 2015. She has had three types of radiation which ended in the summer of 2016 and is currently taking chemo radiation.  Pt presents for cancer rehabilitation evaluation this date due to fatigue and weakness. Dr. Ancil Linsey referred pt to physical  therapy for evaluation and treatment.    How long can you sit comfortably? She is able to sit for an hour prior to having increased pain    How long can you stand comfortably? able to stand for 15 minutes    How long can you walk comfortably? able to walk for 15 minutes    Patient Stated Goals improve fatigue and decrease back pain..  P   Currently in Pain? Yes  if she stands to do dishes pain goes  up to an 8 or 9.    Pain Score 3    Pain Location Back   Pain Orientation Left   Pain Descriptors / Indicators Aching;Sharp   Pain Type Chronic pain   Pain Radiating Towards no radiation    Pain Onset More than a month ago   Pain Frequency Constant   Aggravating Factors  standing , completing household chores    Pain Relieving Factors ice    Effect of Pain on Daily Activities increases    Multiple Pain Sites Yes   Pain Score 2  8/10 with activity   Pain Location Shoulder   Pain Orientation Left   Pain Descriptors / Indicators Aching   Pain Type Chronic pain   Pain Onset More than a month ago   Pain Frequency Constant   Aggravating Factors  activitiy   Pain Relieving Factors ice    Effect of Pain on Daily Activities increases             [facility] PT Assessment - 07/04/15 0001    Assessment   Medical Diagnosis Cancer with fatigue and low back pain    Referring Provider Ancil Linsey   Onset Date/Surgical Date 04/17/15   Next MD Visit 05/02/2016  Chemo    Prior Therapy none   Precautions   Precautions None   Restrictions   Weight Bearing Restrictions No   Balance Screen   Has the patient fallen in the past 6 months Yes   How many times? 2   Has the patient had a decrease in activity level because of a fear of falling?  Yes   Is the patient reluctant to leave their home because of a fear of falling?  No   Home [place] residence   Home Access Stairs to enter   Entrance Stairs-Number of Steps [place] to live on main level with bedroom/bathroom   Prior Function   Level of Independence Independent   Vocation Retired   Nurse, children's, arts & crafts, (crossfit prior to dx)   Cognition   Overall Cognitive Status Within Functional Limits for tasks assessed   Observation/Other Assessments   Other Surveys  Other Surveys  VAS pain:5   ; VAS fatigue  7.67  :  Vas distress   2.67   Functional Tests   Functional tests Single  leg stance;Sit to Stand   Single Leg Stance   Comments Lt 8 seconds; Rt  10 seconds    Sit to Stand   Comments 5 sit to stand in 17.33    ROM / Strength  AROM / PROM / Strength AROM;Strength   AROM   AROM Assessment Site Shoulder;Lumbar   Right/Left Shoulder Left   Left Shoulder Flexion 165 Degrees   Left Shoulder ABduction 118 Degrees   Left Shoulder Internal Rotation 80 Degrees   Left Shoulder External Rotation 80 Degrees   Lumbar Flexion normal   Lumbar Extension normal   Lumbar - Right Side Bend normal   Lumbar - Left Side Bend normal   Lumbar - Right Rotation normal   Lumbar - Left Rotation normal   Strength   Strength Assessment Site Hip;Knee   Right/Left Hip Right;Left   Right Hip Flexion 4+/5   Right Hip Extension 3/5   Right Hip ABduction 4+/5   Left Hip Flexion 5/5   Left Hip Extension 3/5   Left Hip ABduction 4/5   Right/Left Knee Right;Left   Right Knee Extension 5/5   Left Knee Extension 5/5   6 Minute Walk- Baseline   6 Minute Walk- Baseline --  1296 with fatigue 4/10  self correct loss of balance  x 5.                    [place] Adult PT Treatment/Exercise - 07/04/15 1117    Standardized Balance Assessment   Standardized Balance Assessment --  6 minute walking test pt able to complet 1246 ft    Knee/Hip Exercises: Standing   SLS x3   Knee/Hip Exercises: Seated   Sit to Sand 5 reps   Shoulder Exercises: Supine   Flexion AAROM;Both;5 reps  wand   ABduction AAROM;Right;5 reps  wand                 PT Education - 07/04/15 1115    Education provided Yes   Education Details Pt has already been given a cancer booklet from OT visit in December. Pt given information on lymphedema, the benefits of walking, Shoulder wand exercises and Single leg stance    Person(s) Educated Patient   Methods Explanation;Verbal cues;Handout   Comprehension Verbalized understanding;Returned demonstration          PT Short Term Goals - 07/04/15 1128     PT SHORT TERM GOAL #1   Title PT will improve left shoulder ROM by 15 degrees to allow ease of donning of clothing    Time 4   Period Weeks   Status New   PT SHORT TERM GOAL #2   Title Pt will improve single leg stance to 20 seconds to allow pt to be ablet to ambulate for 1300 ft without lose of balance for improved confidence for community ambulation.    Time 4   Period Weeks   Status New   PT SHORT TERM GOAL #3   Title Pt to report back and shoulder pain are no greater than a 1/10 to assist patient with sleep    Time 4   Period Weeks   Status New   PT SHORT TERM GOAL #4   Title Pt VAS fatige to be no greater than a 4 to allow pt to complete 1 to 2 hours of housework without having to rest    Time 4   Period Weeks   Status New           PT Long Term Goals - 07/04/15 1138    PT LONG TERM GOAL #1   Title Pt Shoulder ROM to be within functional limits to allow pt to lift plates into higher cabinets.    Time 8  Period Weeks   Status New   PT LONG TERM GOAL #2   Title Pt core and LE strength to be 5/5 to allow pt to walk on uneven surfaces for 30 minutes for pt to be able to walk on her property without fear of falling.    Time 8   Period Weeks   Status New   PT LONG TERM GOAL #3   Title Pt UE strength to improve to allow pt to be able bring in groceries    Time 8   Period Weeks   Status New   PT LONG TERM GOAL #4   Title Pt fatigue to be no greater than a 2 to allow pt to be up and completing functional activites , ( housecleaning, shopping or working out in the barn) for 3 hours without having to rest.    Time 8   Period Weeks   Status New   PT LONG TERM GOAL #5   Title Pt to be able to single leg stance for 30 seconds to allow pt to walk for 15 minutes without having to self correct loss of balance and to allow pt to report that she has not had a fall in the past 4 weeks.    Time 8   Period Weeks   Status New               Plan - 07/04/15 1121     Clinical Impression Statement Ms. Bai is a 50 yo female who has been diagnosed with stage IV breast cancer.  She has had three bouts of radiation and is currently going through chemotherapy.  She comes to the department  with complaint of being fatigued all the time as well as having a stiff Left shoulder.  She is being referred and will benefit from skilled therapy to alleviate her symptoms, increase her safety  and maximize her functional ability,     Pt will benefit from skilled therapeutic intervention in order to improve on the following deficits Abnormal gait;Decreased activity tolerance;Decreased balance;Decreased range of motion;Decreased strength;Difficulty walking;Pain   Rehab Potential Good   PT Frequency 2x / week   PT Duration 12 weeks   PT Treatment/Interventions ADLs/Self Care Home Management;Cryotherapy;Moist Heat;Therapeutic exercise;Therapeutic activities;Functional mobility training;Stair training;Gait training;Balance training;Manual techniques;Passive range of motion   PT Next Visit Plan begin pulley exercises for improved Lt shoulder ROM, begin vector stances and warrior poses for improved balance, begin sit to stand and quadriped opposite arm/leg raise for balance and strength.  Progress to higher balance and strengthening activity as tolerated.    PT Home Exercise Plan given    Consulted and Agree with Plan of Care Patient         Problem List Patient Active Problem List   Diagnosis Date Noted  . CAP (community acquired pneumonia)   . Esophageal reflux   . Metastatic breast cancer ([place])   . HCAP (healthcare-associated pneumonia) 06/02/2015  . Pneumonia 06/02/2015  . Drug-induced cardiomyopathy ([place]) 02/15/2015  . Brain metastases ([place]) 01/29/2015  . Hot flashes due to tamoxifen 09/25/2014  . Genetic testing 09/05/2014  . Family history of prostate cancer   . GERD (gastroesophageal reflux disease) 07/16/2014  . DVT (deep venous thrombosis) ([place]) 07/09/2014  .  Bone metastases ([place]) 05/23/2014  . Breast cancer, stage 4 ([place]) 05/16/2014  . Hyperlipidemia 04/23/2014  . Depression 04/23/2014    Rayetta Humphrey, PT CLT [phone] 07/04/2015, 11:43 AM  [facility] [address]  [place], [place], [address] Phone: [phone]   Fax:  [phone]  Name: [name] MRN: [id number] Date of Birth: 03/25/1966

## 2015-07-04 NOTE — Progress Notes (Signed)
[name], MD [place] [address]    Breast cancer, stage 4 ([place])   04/25/2014 Initial Diagnosis Breast cancer, stage 4   04/25/2014 Imaging CT abdomen/pelvia with widespread metastatic disease to the liver, multiple lytic lesions throughout spine and pelvis. No FX or epidural tumor identified   04/26/2014 Imaging CT head unremarkable   04/26/2014 Imaging CT chest with no lung mass or pulmonary nodules, no adenopathy. Lytic bone lesions, right 2nd rib   04/27/2014 Initial Biopsy U/S guided liver biopsy, lesion in anterior and inferior left hepatic lobe biopsied   04/27/2014 Pathology Results Metastatic adenocarcinoma, CK7, ER+, patchy positivity with PR. Possible primary includes breast, less likely gynecologic   05/15/2014 Mammogram BI-RADS CATEGORY  2: Benign Finding(s)   05/16/2014 PET scan 1. Intensely hypermetabolic hepatic metastasis. 2. Widespread hypermetabolic skeletal lesions. 3. No primary adenocarcinoma identified by FDG PET imaging.   05/19/2014 Imaging MUGA- Left ventricular ejection fracture greater than 70%.   05/21/2014 Breast MRI No suspicious masses or enhancement within the breasts. No axillary adenopathy.   05/22/2014 - 07/03/2014 Antibody Plan Herceptin/Perjeta/Tamoxifen   06/12/2014 - 07/03/2014 Chemotherapy Taxotere added secondary to persistent abdominal and back pain   06/17/2014 - 06/19/2014 Hospital Admission Neutropenia, fever, diarrhea, nausea, vomiting   06/20/2014 - 07/10/2014 Radiation Therapy Dr. Thea Silversmith 12 fractions to L3-S3 (30 Gy) and left scapula (20 Gy).    07/03/2014 Adverse Reaction Perjeta- induced diarrhea.  Perjeta discontinued   07/16/2014 - 07/20/2014 Hospital Admission Electrolyte abnormalities, and diarrhea.  Suspect Perjeta-induced diarrhea.  Negative GI work-up.   07/24/2014 -  Chemotherapy Herceptin/Tamoxifen/Xgeva   08/21/2014 Imaging MUGA- Left ventricular ejection fraction equals 71%.   08/24/2014 PET scan Dramatic reduction in  metabolic activity of the widespread liver metastasis. Liver metastasis now have metabolic activity equal to background normal liver activity. Liver has a nodular contour. Marked reduction in metabolic activity of skeletal lesions..   10/05/2014 Progression Widespread metastatic disease to the brain as described. Between 20 and 30 intracranial metastatic deposits are now seen. No midline shift or incipient herniation   10/09/2014 - 10/26/2014 Radiation Therapy Whole Brain XRT   11/14/2014 Imaging MUGA- LVEF 67%   02/13/2015 Imaging MUGA- LVEF 59%   02/15/2015 Treatment Plan Change Due to declining LVEF, will hold Herceptin per PI guidelines.   04/12/2015 -  Chemotherapy Herceptin restarted   06/02/2015 - 06/08/2015 Hospital Admission Pneumonia   Stage IV adenocarcinoma with metastases to liver and bone  Had last mammogram in [place] this year, Dr. Gaetano Net  Colonoscopy 05/18/2013  Breast Reduction 03/17/2011  CURRENT THERAPY: Herceptin/Tamoxifen/XGEVA   INTERVAL HISTORY: [name] 50 y.o. female returns for follow-up of stage IV adenocarcinoma of the breast, ER+, HER 2 + disease. She continues on Tamoxifen and XGEVA and Herceptin.  [name] was her with her husband and was here today for cycle #18 Herceptin.   She states that she "feels good today" and has begun to get her energy back. She attended physical therapy assessment today and plans to attend twice weekly for 8 weeks to address her lower back and shoulder pain. They will also work on her gait. She says that she is stumbling when she walks for long periods, states she gets wobbly "like a drunk" They are working on this during physical therapy.   Her husband brought up her appetite. She says that nothing tastes good and that she brought half of her dinner home last night. She has been trying to eat  protein and instant breakfast. She only has cravings for kosher dill pickles.   She is worried about her recent tumor marker numbers.    She had diarrhea 2 weeks ago then vomiting then pain in center of her stomach one to two days after her hospital stay. She took an antiemetic and a imodium and laid down. The next morning her stomach felt weird but she did not have any diarrhea or nausea. She has not had diarrhea or vomiting since this episode.   She continues to have short term memory loss and soreness of the head attributed to whole brain radiation. She states that she has trouble remembering her vacation.   She says that she is not sleeping at night and that she wakes up several times. This does not concern her.   During physical exam she states that her abdomen is tender on the right side and that her arm is tender near the elbow. She says that the arm pain feels like a bruise without obvious bruising similar to bone pain.  Reports joint pain in continue neuropathy in the fingers. Reports continued waves of nausea managed with antiemetics.   Denies headaches.  MEDICAL HISTORY: Past Medical History  Diagnosis Date  . PONV (postoperative nausea and vomiting)     pt states scope patch does well  . Anemia   . Depression   . Anxiety   . Headache(784.0)     has migraines - medication controls  . Family history of prostate cancer   . Hot flashes due to tamoxifen 09/25/2014  . Breast cancer ([place]) 04/2014    Presumed dx; stage 4 w/ mets to bone and liver, brain lesions  . Bony metastasis ([place])   . S/P small bowel resection   . Drug-induced cardiomyopathy ([place]) 02/15/2015    has Hyperlipidemia; Depression; Breast cancer, stage 4 ([place]); Bone metastases ([place]); DVT (deep venous thrombosis) ([place]); GERD (gastroesophageal reflux disease); Family history of prostate cancer; Genetic testing; Hot flashes due to tamoxifen; Brain metastases ([place]); Drug-induced cardiomyopathy ([place]); HCAP (healthcare-associated pneumonia); Pneumonia; Metastatic breast cancer ([place]); CAP (community acquired pneumonia); and Esophageal reflux on her  problem list.      has No Known Allergies.  [name] had no medications administered during this visit.  SURGICAL HISTORY: Past Surgical History  Procedure Laterality Date  . Right rotator cuff      2002  . Neck fusion      2003  . Laparoscopic cholecystectomy      2004  . Right knee arthroscopy      2005  . Appendectomy      2008  . Abdominal hysterectomy    . Breast reduction surgery  03/17/2011    Procedure: MAMMARY REDUCTION BILATERAL (BREAST);  Surgeon: Mary A Contogiannis;  Location: [facility];  Service: Plastics;  Laterality: Bilateral;  . Colonoscopy N/A 07/13/2013    Procedure: COLONOSCOPY;  Surgeon: Rogene Houston, MD;  [facility];  Service: Endoscopy;  Laterality: N/A;  930  . Liver biopsy  04/2014  . Portacath placement    . Esophagogastroduodenoscopy N/A 05/25/2014    Procedure: ESOPHAGOGASTRODUODENOSCOPY (EGD);  Surgeon: Rogene Houston, MD;  [facility];  Service: Endoscopy;  Laterality: N/A;  155  . Laparoscopic appendectomy    . Colonoscopy N/A 11/26/2014    Procedure: COLONOSCOPY;  Surgeon: Rogene Houston, MD;  [facility];  Service: Endoscopy;  Laterality: N/A;  730    SOCIAL HISTORY: Social History  Social History  . Marital Status: Married    Spouse Name: N/A  . Number of Children: N/A  . Years of Education: N/A   Occupational History  . Not on file.   Social History Main Topics  . Smoking status: Former Smoker    Types: Cigarettes    Quit date: 08/20/1994  . Smokeless tobacco: Never Used  . Alcohol Use: Yes     Comment: Occasionally  . Drug Use: No  . Sexual Activity: Yes    Birth Control/ Protection: Surgical   Other Topics Concern  . Not on file   Social History Narrative    FAMILY HISTORY: Family History  Problem Relation Age of Onset  . Diabetes Father   . Heart attack Maternal Grandmother 30    multiple over lifetime.  . Cancer Maternal Grandmother 42    NOS  .  Prostate cancer Maternal Grandfather     dx in his 68s  . Lung cancer Paternal Grandfather     dx <50  . Lymphoma Maternal Aunt     dx in her 32s  . Melanoma Cousin 65    maternal first cousin  . Brain cancer Cousin     paternal first cousin dx under 6  . Prostate cancer Other     MGF's father  . Colon cancer Other     MGM's mother  Her son lives in [place]. Her daughter lives in [place].  Review of Systems  Constitutional: Negative for fever, chills, weight loss.  HENT: . Negative for congestion, hearing loss, sore throat and tinnitus.   Eyes: Negative for double vision, pain and discharge.  Respiratory:  Negative for cough, hemoptysis, sputum production, shortness of breath and wheezing.   Cardiovascular: Negative for chest pain, palpitations, claudication, and PND.  Gastrointestinal: Positive for nausea Negative for heartburn, abdominal pain, diarrhea, constipation, blood in stool and melena, vomiting.  Intermittent waves of nausea. Managed with antiemetics. Genitourinary:Negative for dysuria, urgency, frequency and hematuria.  Musculoskeletal: Positive for joint pain, shoulder pain, and back pain. Negative for myalgias and falls.  Skin: Negative for itching and rash.  Neurological: . Negative for tingling, tremors, speech change, focal weakness, seizures, loss of consciousness, weakness. Headaches or blurry vision Endo/Heme/Allergies: Does not bruise/bleed easily.  Psychiatric/Behavioral: Positive for short term memory loss. Negative for suicidal ideas, and substance abuse, nervous/axiety, depression. Memory loss attributed to whole brain radiation.  14 point review of systems was performed and is negative except as detailed under history of present illness and above  PHYSICAL EXAMINATION  ECOG PERFORMANCE STATUS: 1 - Symptomatic but completely ambulatory  Filed Vitals:   07/04/15 1041  BP: 124/59  Pulse: 94  Temp: 97.7 F (36.5 C)  Resp: 18    Physical Exam   Constitutional: She is oriented to person, place, and time and well-developed, well-nourished, and in no distress. Facial Cushingoid features (Improved).  Mood is good. Her cheeks appear thinner. She still has a buffalo hump on her back (Improved). HENT:  Head: Normocephalic and atraumatic. Hair regrowth. Nose: Nose normal.  Mouth/Throat:  Negative Eyes: Conjunctivae and EOM are normal. Pupils are equal, round, and reactive to light. Right eye exhibits no discharge. Left eye exhibits no discharge. No scleral icterus.  Neck: Normal range of motion. No tracheal deviation present. No thyromegaly present.  Cardiovascular: Normal rate, regular rhythm and normal heart sounds.  Exam reveals no gallop and no friction rub.   No murmur heard. Pulmonary/Chest: Effort normal and breath sounds normal. She has  no wheezes. She has no rales.  Abdominal: Soft. Bowel sounds are normal. She exhibits no distension and no mass. There is no tenderness. There is no rebound and no guarding.  Musculoskeletal: Normal range of motion.  Lymphadenopathy:    She has no cervical adenopathy.  Neurological: She is alert and oriented to person, place, and time. She has normal reflexes. No cranial nerve deficit. Gait normal. Coordination normal.  Skin: Skin is warm and dry. No rash noted.  Psychiatric: Mood, memory, affect and judgment normal.  Nursing note and vitals reviewed.  LABORATORY DATA: I have reviewed the data as listed.  CBC    Component Value Date/Time   WBC 5.3 07/04/2015 1145   RBC 4.04 07/04/2015 1145   HGB 12.1 07/04/2015 1145   HCT 37.0 07/04/2015 1145   PLT 207 07/04/2015 1145   MCV 91.6 07/04/2015 1145   MCH 30.0 07/04/2015 1145   MCHC 32.7 07/04/2015 1145   RDW 13.8 07/04/2015 1145   LYMPHSABS 1.2 07/04/2015 1145   MONOABS 0.4 07/04/2015 1145   EOSABS 0.2 07/04/2015 1145   BASOSABS 0.0 07/04/2015 1145   CMP     Component Value Date/Time   NA 140 07/04/2015 1145   K 3.6 07/04/2015 1145    CL 103 07/04/2015 1145   CO2 27 07/04/2015 1145   GLUCOSE 124* 07/04/2015 1145   BUN 11 07/04/2015 1145   CREATININE 0.70 07/04/2015 1145   CALCIUM 9.1 07/04/2015 1145   PROT 6.6 07/04/2015 1145   ALBUMIN 3.7 07/04/2015 1145   AST 25 07/04/2015 1145   ALT 20 07/04/2015 1145   ALKPHOS 36* 07/04/2015 1145   BILITOT 0.3 07/04/2015 1145   GFRNONAA >60 07/04/2015 1145   GFRAA >60 07/04/2015 1145   Results for LAKEIDRA, RELIFORD (MRN [id number]) as of 07/07/2015 18:05  Ref. Range 01/08/2015 10:50 02/15/2015 11:03 04/12/2015 10:39 05/03/2015 11:44 06/13/2015 10:40  CA 27.29 Latest Ref Range: 0.0-38.6 U/mL 34.3 27.8 27.0 22.8 42.0 (H)    RADIOLOGY: I have personally reviewed the radiological images as listed and agreed with the findings in the report.  Study Result     CLINICAL DATA: Cough and congestion. Breast cancer.  EXAM: CHEST 2 VIEW  COMPARISON: 06/02/2015  FINDINGS: Anterior ill-defined pulmonary opacity has developed, seen best on the lateral view. Normal heart size. Stable right jugular Port-A-Cath. No pneumothorax. No pleural effusion. Chronic right rib deformity.  IMPRESSION: New ill-defined pulmonary opacity in the anterior lung seen on the lateral view. Followup PA and lateral chest X-ray is recommended in 3-4 weeks following trial of antibiotic therapy to ensure resolution and exclude underlying malignancy.   Electronically Signed  By: Marybelle Killings M.D.  On: 06/07/2015 13:05   Study Result     CLINICAL DATA: Right side chest pain, history of breast cancer  EXAM: CT ANGIOGRAPHY CHEST WITH CONTRAST  TECHNIQUE: Multidetector CT imaging of the chest was performed using the standard protocol during bolus administration of intravenous contrast. Multiplanar CT image reconstructions and MIPs were obtained to evaluate the vascular anatomy.  CONTRAST: 195m OMNIPAQUE IOHEXOL 350 MG/ML SOLN  COMPARISON: 03/29/2015  FINDINGS: Sagittal images  of the spine shows again small sclerotic bone lesions within thoracic spine and sternum consistent with treated metastasis.  The visualized upper abdomen shows fatty infiltration of the liver. Stable low-density lesion in right hepatic lobe posteriorly measures 3.7 cm consistent with stable metastatic disease.  Images of the thoracic inlet are unremarkable. Central airways are patent. A precarinal lymph node measures 8 mm  not pathologic by size criteria. No pulmonary embolus is noted. There is no pericardial effusion. Heart size within normal limits. There is patchy infiltrate in right lower lobe superior segment and basal segment. Findings are consistent with early pneumonia. Stable mild nodular contour of the liver. Stable low-density lesion within spleen.  There is no pulmonary edema.  Review of the MIP images confirms the above findings.  IMPRESSION: 1. No pulmonary embolus is noted. 2. There is patchy infiltrate/pneumonia right lower lobe. 3. Again noted fatty infiltration of the liver and subtle nodular liver contour. Stable metastatic lesion in right hepatic lobe posteriorly measures 3.7 cm. 4. Stable small sclerotic bone lesions. 5. No pulmonary edema. 6. These results were called by telephone at the time of interpretation on 06/02/2015 at 3:19 pm to Dr. Ezequiel Essex , who verbally acknowledged these results.   Electronically Signed  By: Lahoma Crocker M.D.  On: 06/02/2015 15:20   Study Result     CLINICAL DATA: Headache and history of breast carcinoma with treated skull base and intracranial metastatic lesions.  EXAM: CT HEAD WITHOUT CONTRAST  TECHNIQUE: Contiguous axial images were obtained from the base of the skull through the vertex without intravenous contrast.  COMPARISON: MRI of the brain on 03/29/2015 and head CT on 03/22/2015.  FINDINGS: By unenhanced CT, the only visualized metastatic lesion is a stable 7 mm sclerotic lesion at  the skull base within the left clivus. Previously visualized cerebral metastases have been shown to have responded to radiation therapy. These are nonvisualized by unenhanced CT and there is no evidence of acute hemorrhage, visible infarction, mass effect or hydrocephalus. No extra-axial fluid collections are identified.  IMPRESSION: No acute findings. The only visible metastatic lesion by unenhanced CT is a stable 7 mm sclerotic lesion in the left clivus.   Electronically Signed  By: Aletta Edouard M.D.  On: 06/02/2015 15:11    Study Result     CLINICAL DATA: Subsequent treatment strategy for breast cancer.  EXAM: NUCLEAR MEDICINE PET SKULL BASE TO THIGH  TECHNIQUE: 15.5 mCi F-18 FDG was injected intravenously. Full-ring PET imaging was performed from the skull base to thigh after the radiotracer. CT data was obtained and used for attenuation correction and anatomic localization.  FASTING BLOOD GLUCOSE: Value: 88 mg/dl  COMPARISON: 11/01/2014.  FINDINGS: NECK  No hypermetabolic lymph nodes in the neck.  CHEST  No hypermetabolic mediastinal or hilar nodes. No suspicious pulmonary nodules on the CT scan.  Right Port-A-Cath tip is at the distal SVC level.  ABDOMEN/PELVIS  No abnormal hypermetabolic activity within the liver, pancreas, adrenal glands, or spleen. No hypermetabolic lymph nodes in the abdomen or pelvis.  Liver contour remains nodular with parenchyma heterogeneous in appearance. As described previously this remains compatible with treated hepatic metastases.  9 mm nodule in the left anterior abdominal wall subcutaneous fat shows mild hypermetabolism. This would be an unusual location for metastatic disease and is felt to be most likely related to infection/inflammation or injection granuloma.  SKELETON  No focal hypermetabolic activity to suggest skeletal metastasis. Bone windows show innumerable sclerotic lesions  throughout the skeleton which are stable. Index lesion in the L4 vertebral body measures 16 mm today, unchanged since previous. The fracture of the left acromion is again noted and FDG uptake in the region of the fracture is presumably related to healing.  IMPRESSION: 1. Stable exam. No definite evidence for hypermetabolic metastases. There is a small nodule in the left anterior abdominal wall subcutaneous fat that shows low level  FDG uptake, likely related to small focus of infection/inflammation or injection granuloma. Continued attention to this area on followup is recommended. 2. Healing fracture of the left acromion again noted. Uptake in this region compatible with granulation. 3. Nodular heterogeneous liver compatible with treated metastases. No focal hypermetabolism today to suggest hypermetabolic liver disease.   Electronically Signed  By: Misty Stanley M.D.  On: 03/29/2015 09:50     ASSESSMENT and THERAPY PLAN:  Stage IV ER positive, HER-2 positive carcinoma of the breast Widespread Brain Metastases End of Life issues Insomnia Anxiety UE DVT, RIJ and R subclavian Cancer related Fatigue Decline in EF, Herceptin held. Herceptin restarted on 04/12/2015   She has a Chest X-ray later today for follow-up of pneumonia. She has been scheduled for repeat PET on 07/05/2015 and repeat brain MRI on 07/16/2015. We discussed tumor markers in detail and I tried to reassure her; however if there is evidence of progression on imaging we will discuss how to proceed with therapy.  I have refilled her MS Contin today. She will proceed with herceptin today.  She will return after PET scan to discuss the results.   All questions were answered. The patient knows to call the clinic with any problems, questions or concerns. We can certainly see the patient much sooner if necessary.   This note was signed electronically.  This document serves as a record of services personally  performed by Ancil Linsey, MD. It was created on her behalf by Kandace Blitz, a trained medical scribe. The creation of this record is based on the scribe's personal observations and the provider's statements to them. This document has been checked and approved by the attending provider. I have reviewed the above documentation for accuracy and completeness, and I agree with the above.  Kelby Fam. Whitney Muse, MD

## 2015-07-04 NOTE — Patient Instructions (Signed)
Heel Raise: Unilateral (Standing)    Balance on left foot,  Repeat __10-30__ times per set. Do __5__ sets per session. Do _2___ sessions per day.  http://orth.exer.us/40   Copyright  VHI. All rights reserved.  Functional Quadriceps: Sit to Stand    Sit on edge of chair, feet flat on floor. Stand upright, extending knees fully. Repeat _5-10___ times per set. Do 1____ sets per session. Do 2____ sessions per day.  http://orth.exer.us/734   Copyright  VHI. All rights reserved.

## 2015-07-04 NOTE — Progress Notes (Signed)
Tolerated well. Some dizziness when ambulatory to leave. Relieved by sitting for a few minutes. BP rechecked, WNL

## 2015-07-05 ENCOUNTER — Encounter (HOSPITAL_COMMUNITY)
Admission: RE | Admit: 2015-07-05 | Discharge: 2015-07-05 | Disposition: A | Discharge status: Home / Self Care | Payer: 59 | Source: Ambulatory Visit | Attending: Hematology & Oncology | Admitting: Hematology & Oncology

## 2015-07-05 DIAGNOSIS — C50919 Malignant neoplasm of unspecified site of unspecified female breast: Secondary | ICD-10-CM | POA: Insufficient documentation

## 2015-07-05 LAB — GLUCOSE, CAPILLARY: GLUCOSE-CAPILLARY: 93 mg/dL (ref 65–99)

## 2015-07-05 MED ORDER — FLUDEOXYGLUCOSE F - 18 (FDG) INJECTION
12.5000 | Freq: Once | INTRAVENOUS | Status: AC | PRN
  Administered 2015-07-05: 12.5 via INTRAVENOUS

## 2015-07-11 ENCOUNTER — Other Ambulatory Visit (HOSPITAL_COMMUNITY): Payer: Self-pay | Admitting: *Deleted

## 2015-07-11 DIAGNOSIS — C50919 Malignant neoplasm of unspecified site of unspecified female breast: Secondary | ICD-10-CM

## 2015-07-12 ENCOUNTER — Encounter (HOSPITAL_COMMUNITY): Payer: 59 | Attending: Hematology & Oncology

## 2015-07-12 VITALS — BP 122/54 | HR 105 | Temp 98.0°F | Resp 18

## 2015-07-12 DIAGNOSIS — C229 Malignant neoplasm of liver, not specified as primary or secondary: Secondary | ICD-10-CM | POA: Diagnosis present

## 2015-07-12 DIAGNOSIS — C50919 Malignant neoplasm of unspecified site of unspecified female breast: Secondary | ICD-10-CM | POA: Diagnosis not present

## 2015-07-12 DIAGNOSIS — C7951 Secondary malignant neoplasm of bone: Secondary | ICD-10-CM

## 2015-07-12 LAB — COMPREHENSIVE METABOLIC PANEL
ALT: 19 U/L (ref 14–54)
AST: 23 U/L (ref 15–41)
Albumin: 3.7 g/dL (ref 3.5–5.0)
Alkaline Phosphatase: 33 U/L — ABNORMAL LOW (ref 38–126)
Anion gap: 9 (ref 5–15)
BILIRUBIN TOTAL: 0.4 mg/dL (ref 0.3–1.2)
BUN: 10 mg/dL (ref 6–20)
CO2: 27 mmol/L (ref 22–32)
Calcium: 9 mg/dL (ref 8.9–10.3)
Chloride: 106 mmol/L (ref 101–111)
Creatinine, Ser: 0.7 mg/dL (ref 0.44–1.00)
GFR calc Af Amer: >60 mL/min (ref >60)
Glucose, Bld: 109 mg/dL — ABNORMAL HIGH (ref 65–99)
POTASSIUM: 3.5 mmol/L (ref 3.5–5.1)
Sodium: 142 mmol/L (ref 135–145)
TOTAL PROTEIN: 6.6 g/dL (ref 6.5–8.1)

## 2015-07-12 MED ORDER — DENOSUMAB 120 MG/1.7ML ~~LOC~~ SOLN
120.0000 mg | Freq: Once | SUBCUTANEOUS | Status: AC
  Administered 2015-07-12: 120 mg via SUBCUTANEOUS
  Filled 2015-07-12: qty 1.7

## 2015-07-12 MED ORDER — SODIUM CHLORIDE 0.9% FLUSH
20.0000 mL | INTRAVENOUS | Status: DC | PRN
  Administered 2015-07-12: 20 mL via INTRAVENOUS
  Filled 2015-07-12: qty 20

## 2015-07-12 MED ORDER — HEPARIN SOD (PORK) LOCK FLUSH 100 UNIT/ML IV SOLN
500.0000 [IU] | Freq: Once | INTRAVENOUS | Status: AC
  Administered 2015-07-12: 500 [IU] via INTRAVENOUS

## 2015-07-12 NOTE — Patient Instructions (Signed)
[place] at [facility] Discharge Instructions  RECOMMENDATIONS MADE BY THE CONSULTANT AND ANY TEST RESULTS WILL BE SENT TO YOUR REFERRING PHYSICIAN.  Xgava injection today. Port flush with labs today as well.    Thank you for choosing [place] at [facility] to provide your oncology and hematology care.  To afford each patient quality time with our provider, please arrive at least 15 minutes before your scheduled appointment time.   Beginning January 23rd 2017 lab work for the [facility] will be done in the  Main lab at [facility] on 1st floor. If you have a lab appointment with the [facility] please come in thru the  Main Entrance and check in at the main information desk  You need to re-schedule your appointment should you arrive 10 or more minutes late.  We strive to give you quality time with our providers, and arriving late affects you and other patients whose appointments are after yours.  Also, if you no show three or more times for appointments you may be dismissed from the clinic at the providers discretion.     Again, thank you for choosing [facility].  Our hope is that these requests will decrease the amount of time that you wait before being seen by our physicians.       _____________________________________________________________  Should you have questions after your visit to [facility], please contact our office at [phone] between the hours of 8:30 a.m. and 4:30 p.m.  Voicemails left after 4:30 p.m. will not be returned until the following business day.  For prescription refill requests, have your pharmacy contact our office.         Resources For Cancer Patients and their Caregivers ? American Cancer Society: Can assist with transportation, wigs, general needs, runs Look Good Feel Better.        561-148-0681 ? Cancer Care: Provides financial assistance, online support groups,  medication/co-pay assistance.  1-800-813-HOPE 613-158-2372) ? [facility] Assists [place] cancer patients and their families through emotional , educational and financial support.  984-775-3076 ? [place] DSS Where to apply for food stamps, Medicaid and utility assistance. 208-651-7338 ? [facility]: Transportation to medical appointments. 786-261-7714 ? Social Security Administration: May apply for disability if have a Stage IV cancer. 9374395846 937-791-5960 ? [employer], Disability and Transit Services: Assists with nutrition, care and transit needs. ([phone]

## 2015-07-12 NOTE — Progress Notes (Signed)
[name] presented for Portacath access and flush. Portacath located right chest wall accessed with  H 20 needle. Good blood return present.  Specimen drawn for labs. Portacath flushed with 76ml NS and 500U/73ml Heparin and needle removed intact. Procedure without incident. Patient tolerated procedure well.  [name] presents today for injection per MD orders. Xgeva administered SQ in left Abdomen. Administration without incident. Patient tolerated well.

## 2015-07-13 LAB — ESTRADIOL

## 2015-07-13 LAB — FOLLICLE STIMULATING HORMONE: FSH: 36.1 m[IU]/mL

## 2015-07-16 ENCOUNTER — Ambulatory Visit (HOSPITAL_COMMUNITY): Payer: 59 | Admitting: Physical Therapy

## 2015-07-16 ENCOUNTER — Ambulatory Visit (HOSPITAL_COMMUNITY)
Admission: RE | Admit: 2015-07-16 | Discharge: 2015-07-16 | Disposition: A | Discharge status: Home / Self Care | Payer: 59 | Source: Ambulatory Visit | Attending: Hematology & Oncology | Admitting: Hematology & Oncology

## 2015-07-16 ENCOUNTER — Other Ambulatory Visit (HOSPITAL_COMMUNITY): Payer: Self-pay | Admitting: *Deleted

## 2015-07-16 ENCOUNTER — Telehealth (HOSPITAL_COMMUNITY): Payer: Self-pay | Admitting: Physical Therapy

## 2015-07-16 DIAGNOSIS — C7951 Secondary malignant neoplasm of bone: Secondary | ICD-10-CM | POA: Diagnosis not present

## 2015-07-16 DIAGNOSIS — C7931 Secondary malignant neoplasm of brain: Secondary | ICD-10-CM | POA: Diagnosis not present

## 2015-07-16 DIAGNOSIS — C50919 Malignant neoplasm of unspecified site of unspecified female breast: Secondary | ICD-10-CM

## 2015-07-16 DIAGNOSIS — Z923 Personal history of irradiation: Secondary | ICD-10-CM | POA: Diagnosis not present

## 2015-07-16 MED ORDER — ANASTROZOLE 1 MG PO TABS: 1.0000 mg | ORAL_TABLET | Freq: Every day | ORAL | Status: DC

## 2015-07-16 MED ORDER — GADOBENATE DIMEGLUMINE 529 MG/ML IV SOLN
20.0000 mL | Freq: Once | INTRAVENOUS | Status: AC | PRN
  Administered 2015-07-16: 20 mL via INTRAVENOUS

## 2015-07-16 NOTE — Telephone Encounter (Signed)
Contacted patient regarding NS for appointment this morning.  Requested patient to call clinic regarding next scheduled appointment and reminded to call and cancel if she was unable to make this appointment.  Teena Irani, PTA/CLT [phone]

## 2015-07-18 ENCOUNTER — Other Ambulatory Visit (HOSPITAL_COMMUNITY): Payer: Self-pay | Admitting: Oncology

## 2015-07-18 ENCOUNTER — Ambulatory Visit (HOSPITAL_COMMUNITY): Payer: 59 | Attending: Hematology & Oncology | Admitting: Physical Therapy

## 2015-07-18 DIAGNOSIS — R530 Neoplastic (malignant) related fatigue: Secondary | ICD-10-CM

## 2015-07-18 DIAGNOSIS — R2689 Other abnormalities of gait and mobility: Secondary | ICD-10-CM | POA: Insufficient documentation

## 2015-07-18 DIAGNOSIS — Z9181 History of falling: Secondary | ICD-10-CM | POA: Insufficient documentation

## 2015-07-18 DIAGNOSIS — M25612 Stiffness of left shoulder, not elsewhere classified: Secondary | ICD-10-CM

## 2015-07-18 DIAGNOSIS — R269 Unspecified abnormalities of gait and mobility: Secondary | ICD-10-CM | POA: Diagnosis present

## 2015-07-18 DIAGNOSIS — R531 Weakness: Secondary | ICD-10-CM | POA: Diagnosis not present

## 2015-07-18 DIAGNOSIS — R6889 Other general symptoms and signs: Secondary | ICD-10-CM | POA: Insufficient documentation

## 2015-07-18 NOTE — Therapy (Signed)
[place], [place], [address] Phone: [phone]   Fax:  [phone]  Physical Therapy Treatment  Patient Details  Name: [name] MRN: [id number] Date of Birth: 1966/04/07 Referring Provider: Ancil Linsey  Encounter Date: 07/18/2015      PT End of Session - 07/18/15 1333    Visit Number 2   Number of Visits 16   Date for PT Re-Evaluation 08/03/15   Authorization Type united health care   PT Start Time 1030   PT Stop Time 1105   PT Time Calculation (min) 35 min   Activity Tolerance Patient tolerated treatment well   Behavior During Therapy [facility] for tasks assessed/performed      Past Medical History  Diagnosis Date  . PONV (postoperative nausea and vomiting)     pt states scope patch does well  . Anemia   . Depression   . Anxiety   . Headache(784.0)     has migraines - medication controls  . Family history of prostate cancer   . Hot flashes due to tamoxifen 09/25/2014  . Breast cancer ([place]) 04/2014    Presumed dx; stage 4 w/ mets to bone and liver, brain lesions  . Bony metastasis ([place])   . S/P small bowel resection   . Drug-induced cardiomyopathy ([place]) 02/15/2015    Past Surgical History  Procedure Laterality Date  . Right rotator cuff      2002  . Neck fusion      2003  . Laparoscopic cholecystectomy      2004  . Right knee arthroscopy      2005  . Appendectomy      2008  . Abdominal hysterectomy    . Breast reduction surgery  03/17/2011    Procedure: MAMMARY REDUCTION BILATERAL (BREAST);  Surgeon: Mary A Contogiannis;  Location: [place];  Service: Plastics;  Laterality: Bilateral;  . Colonoscopy N/A 07/13/2013    Procedure: COLONOSCOPY;  Surgeon: Rogene Houston, MD;  [facility];  Service: Endoscopy;  Laterality: N/A;  930  . Liver biopsy  04/2014  . Portacath placement    . Esophagogastroduodenoscopy N/A 05/25/2014    Procedure:  ESOPHAGOGASTRODUODENOSCOPY (EGD);  Surgeon: Rogene Houston, MD;  [facility];  Service: Endoscopy;  Laterality: N/A;  155  . Laparoscopic appendectomy    . Colonoscopy N/A 11/26/2014    Procedure: COLONOSCOPY;  Surgeon: Rogene Houston, MD;  [facility];  Service: Endoscopy;  Laterality: N/A;  730    There were no vitals filed for this visit.  Visit Diagnosis:  Weakness  Neoplastic malignant related fatigue  Abnormality of gait  Stiffness of shoulder joint, left  Unstable balance  At risk for falls      Subjective Assessment - 07/18/15 1329    Subjective Pt states she called to cancel her last appointment due to conflicting appointment.  Pt showed 15 mins late today.  States she has been completing her HEP and her Lt shoulder is sore today.     Currently in Pain? No/denies                         [facility] Adult PT Treatment/Exercise - 07/18/15 1330    Knee/Hip Exercises: Standing   SLS Bilaterally with <5" hold max   Shoulder Exercises: Supine   Flexion AAROM;Both;5 reps   ABduction AAROM;Right;5 reps   Shoulder Exercises: Pulleys   Flexion  2 minutes   ABduction 2 minutes                PT Education - 07/18/15 1332    Education provided Yes   Education Details review of initial evaluation including goals and measures.   Review of HEP.   Person(s) Educated Patient   Methods Explanation;Demonstration;Handout   Comprehension Verbalized understanding;Returned demonstration;Verbal cues required;Tactile cues required          PT Short Term Goals - 07/18/15 1345    PT SHORT TERM GOAL #1   Title PT will improve left shoulder ROM by 15 degrees to allow ease of donning of clothing    Time 4   Period Weeks   Status On-going   PT SHORT TERM GOAL #2   Title Pt will improve single leg stance to 20 seconds to allow pt to be ablet to ambulate for 1300 ft without lose of balance for improved confidence for community ambulation.     Time 4   Period Weeks   Status On-going   PT SHORT TERM GOAL #3   Title Pt to report back and shoulder pain are no greater than a 1/10 to assist patient with sleep    Time 4   Period Weeks   Status On-going   PT SHORT TERM GOAL #4   Title Pt VAS fatige to be no greater than a 4 to allow pt to complete 1 to 2 hours of housework without having to rest    Time 4   Period Weeks   Status On-going           PT Long Term Goals - 07/18/15 1345    PT LONG TERM GOAL #1   Title Pt Shoulder ROM to be within functional limits to allow pt to lift plates into higher cabinets.    Time 8   Period Weeks   Status On-going   PT LONG TERM GOAL #2   Title Pt core and LE strength to be 5/5 to allow pt to walk on uneven surfaces for 30 minutes for pt to be able to walk on her property without fear of falling.    Time 8   Period Weeks   Status On-going   PT LONG TERM GOAL #3   Title Pt UE strength to improve to allow pt to be able bring in groceries    Time 8   Period Weeks   Status On-going   PT LONG TERM GOAL #4   Title Pt fatigue to be no greater than a 2 to allow pt to be up and completing functional activites , ( housecleaning, shopping or working out in the barn) for 3 hours without having to rest.    Time 8   Period Weeks   Status On-going   PT LONG TERM GOAL #5   Title Pt to be able to single leg stance for 30 seconds to allow pt to walk for 15 minutes without having to self correct loss of balance and to allow pt to report that she has not had a fall in the past 4 weeks.    Time 8   Period Weeks   Status On-going               Plan - 07/18/15 1333    Clinical Impression Statement PT late and unable to complete full program or add more exercises.  Added pulleys to work on [employer] for Lt shoulder for flexion and abduction.  Pt able to  complete in painfree ROM. Reveiwed measurements/deficits found on initial evaluation and goals.  PT encouraged to continue HEP and begin a walking  program.  Pt verbalized underastanding.    Pt will benefit from skilled therapeutic intervention in order to improve on the following deficits Abnormal gait;Decreased activity tolerance;Decreased balance;Decreased range of motion;Decreased strength;Difficulty walking;Pain   Rehab Potential Good   PT Frequency 2x / week   PT Duration 12 weeks   PT Treatment/Interventions ADLs/Self Care Home Management;Cryotherapy;Moist Heat;Therapeutic exercise;Therapeutic activities;Functional mobility training;Stair training;Gait training;Balance training;Manual techniques;Passive range of motion   PT Next Visit Plan Next session begin vector stances and warrior poses for improved balance.  General LE functional strengthening, sit to stand and quadriped opposite arm/leg raise for balance and strength.  Progress to higher balance and strengthening activity as tolerated.    PT Home Exercise Plan given    Consulted and Agree with Plan of Care Patient        Problem List Patient Active Problem List   Diagnosis Date Noted  . CAP (community acquired pneumonia)   . Esophageal reflux   . Metastatic breast cancer ([place])   . HCAP (healthcare-associated pneumonia) 06/02/2015  . Pneumonia 06/02/2015  . Drug-induced cardiomyopathy ([place]) 02/15/2015  . Brain metastases ([place]) 01/29/2015  . Hot flashes due to tamoxifen 09/25/2014  . Genetic testing 09/05/2014  . Family history of prostate cancer   . GERD (gastroesophageal reflux disease) 07/16/2014  . DVT (deep venous thrombosis) ([place]) 07/09/2014  . Bone metastases ([place]) 05/23/2014  . Breast cancer, stage 4 ([place]) 05/16/2014  . Hyperlipidemia 04/23/2014  . Depression 04/23/2014    Teena Irani, PTA/CLT [phone] 07/18/2015, 1:46 PM  [facility] [address] [place], [place], [address] Phone: [phone]   Fax:  [phone]  Name: NEVAH LICHTER MRN: [id number] Date of Birth: Sep 26, 1965

## 2015-07-19 ENCOUNTER — Encounter (HOSPITAL_BASED_OUTPATIENT_CLINIC_OR_DEPARTMENT_OTHER): Payer: 59

## 2015-07-19 VITALS — BP 129/86 | HR 114 | Temp 97.7°F | Resp 20

## 2015-07-19 DIAGNOSIS — C7951 Secondary malignant neoplasm of bone: Secondary | ICD-10-CM | POA: Diagnosis not present

## 2015-07-19 DIAGNOSIS — C50812 Malignant neoplasm of overlapping sites of left female breast: Secondary | ICD-10-CM

## 2015-07-19 DIAGNOSIS — C50919 Malignant neoplasm of unspecified site of unspecified female breast: Secondary | ICD-10-CM

## 2015-07-19 DIAGNOSIS — Z5111 Encounter for antineoplastic chemotherapy: Secondary | ICD-10-CM

## 2015-07-19 MED ORDER — GOSERELIN ACETATE 3.6 MG ~~LOC~~ IMPL
3.6000 mg | DRUG_IMPLANT | Freq: Once | SUBCUTANEOUS | Status: AC
  Administered 2015-07-19: 3.6 mg via SUBCUTANEOUS
  Filled 2015-07-19: qty 3.6

## 2015-07-19 NOTE — Patient Instructions (Signed)
..  [place] at [facility] Discharge Instructions  RECOMMENDATIONS MADE BY THE CONSULTANT AND ANY TEST RESULTS WILL BE SENT TO YOUR REFERRING PHYSICIAN.  zolodex administered today  Thank you for choosing [place] at [facility] to provide your oncology and hematology care.  To afford each patient quality time with our provider, please arrive at least 15 minutes before your scheduled appointment time.   Beginning January 23rd 2017 lab work for the [facility] will be done in the  Main lab at [facility] on 1st floor. If you have a lab appointment with the [facility] please come in thru the  Main Entrance and check in at the main information desk  You need to re-schedule your appointment should you arrive 10 or more minutes late.  We strive to give you quality time with our providers, and arriving late affects you and other patients whose appointments are after yours.  Also, if you no show three or more times for appointments you may be dismissed from the clinic at the providers discretion.     Again, thank you for choosing [facility].  Our hope is that these requests will decrease the amount of time that you wait before being seen by our physicians.       _____________________________________________________________  Should you have questions after your visit to [facility], please contact our office at [phone] between the hours of 8:30 a.m. and 4:30 p.m.  Voicemails left after 4:30 p.m. will not be returned until the following business day.  For prescription refill requests, have your pharmacy contact our office.         Resources For Cancer Patients and their Caregivers ? American Cancer Society: Can assist with transportation, wigs, general needs, runs Look Good Feel Better.        7655807684 ? Cancer Care: Provides financial assistance, online support groups, medication/co-pay assistance.   1-800-813-HOPE 7170581078) ? [facility] Assists [place] cancer patients and their families through emotional , educational and financial support.  9066178936 ? [place] DSS Where to apply for food stamps, Medicaid and utility assistance. 631-799-1449 ? RCATS: Transportation to medical appointments. 228-680-8094 ? Social Security Administration: May apply for disability if have a Stage IV cancer. 339-614-9861 [phone] ? [employer], Disability and Transit Services: Assists with nutrition, care and transit needs. [phone]

## 2015-07-22 ENCOUNTER — Other Ambulatory Visit (HOSPITAL_COMMUNITY): Payer: Self-pay | Admitting: Hematology & Oncology

## 2015-07-23 ENCOUNTER — Ambulatory Visit (HOSPITAL_COMMUNITY): Payer: 59 | Admitting: Physical Therapy

## 2015-07-23 DIAGNOSIS — R269 Unspecified abnormalities of gait and mobility: Secondary | ICD-10-CM

## 2015-07-23 DIAGNOSIS — M25612 Stiffness of left shoulder, not elsewhere classified: Secondary | ICD-10-CM

## 2015-07-23 DIAGNOSIS — R6889 Other general symptoms and signs: Secondary | ICD-10-CM

## 2015-07-23 DIAGNOSIS — Z9181 History of falling: Secondary | ICD-10-CM

## 2015-07-23 DIAGNOSIS — R530 Neoplastic (malignant) related fatigue: Secondary | ICD-10-CM

## 2015-07-23 DIAGNOSIS — R531 Weakness: Secondary | ICD-10-CM

## 2015-07-23 DIAGNOSIS — R2689 Other abnormalities of gait and mobility: Secondary | ICD-10-CM

## 2015-07-23 NOTE — Patient Instructions (Signed)
Hamstring Stretch: Active    Support behind left  knee. Starting with knee bent, attempt to straighten knee until a comfortable stretch is felt in back of thigh. Hold _30___ seconds. Repeat __3__ times per set. Do ___1_ sets per session. Do __2__ sessions per day.  http://orth.exer.us/158   Copyright  VHI. All rights reserved.  Knee-to-Chest Stretch: Unilateral    With hand behind left knee, pull knee in to chest until a comfortable stretch is felt in lower back and buttocks. Keep back relaxed. Hold _30___ seconds. Repeat __3__ times per set. Do _1___ sets per session. Do __2__ sessions per day.  http://orth.exer.us/126   Copyright  VHI. All rights reserved.  Piriformis Stretch (All-Fours)    With left leg crossed in front, slide other leg back, lowering hips until stretch is felt. Repeat __1__ times per set. Do __1__ sets per session. Do ___2_ sessions per day.  http://orth.exer.us/292   Copyright  VHI. All rights reserved.

## 2015-07-23 NOTE — Therapy (Signed)
[place], [place], [address] Phone: [phone]   Fax:  [phone]  Physical Therapy Treatment  Patient Details  Name: [name] MRN: [id number] Date of Birth: 08/01/1965 Referring Provider: Ancil Linsey  Encounter Date: 07/23/2015      PT End of Session - 07/23/15 1148    Visit Number 4   Number of Visits 16   Date for PT Re-Evaluation 08/03/15   Authorization Type united health care   Authorization - Visit Number 4   Authorization - Number of Visits 10   PT Start Time [id number]   PT Stop Time 1153   PT Time Calculation (min) 50 min   Equipment Utilized During Treatment Gait belt   Activity Tolerance Patient tolerated treatment well      Past Medical History  Diagnosis Date  . PONV (postoperative nausea and vomiting)     pt states scope patch does well  . Anemia   . Depression   . Anxiety   . Headache(784.0)     has migraines - medication controls  . Family history of prostate cancer   . Hot flashes due to tamoxifen 09/25/2014  . Breast cancer ([place]) 04/2014    Presumed dx; stage 4 w/ mets to bone and liver, brain lesions  . Bony metastasis ([place])   . S/P small bowel resection   . Drug-induced cardiomyopathy ([place]) 02/15/2015    Past Surgical History  Procedure Laterality Date  . Right rotator cuff      2002  . Neck fusion      2003  . Laparoscopic cholecystectomy      2004  . Right knee arthroscopy      2005  . Appendectomy      2008  . Abdominal hysterectomy    . Breast reduction surgery  03/17/2011    Procedure: MAMMARY REDUCTION BILATERAL (BREAST);  Surgeon: Mary A Contogiannis;  Location: [place];  Service: Plastics;  Laterality: Bilateral;  . Colonoscopy N/A 07/13/2013    Procedure: COLONOSCOPY;  Surgeon: Rogene Houston, MD;  [facility];  Service: Endoscopy;  Laterality: N/A;  930  . Liver biopsy  04/2014  . Portacath placement    .  Esophagogastroduodenoscopy N/A 05/25/2014    Procedure: ESOPHAGOGASTRODUODENOSCOPY (EGD);  Surgeon: Rogene Houston, MD;  [facility];  Service: Endoscopy;  Laterality: N/A;  155  . Laparoscopic appendectomy    . Colonoscopy N/A 11/26/2014    Procedure: COLONOSCOPY;  Surgeon: Rogene Houston, MD;  [facility];  Service: Endoscopy;  Laterality: N/A;  730    There were no vitals filed for this visit.  Visit Diagnosis:  Weakness  Neoplastic malignant related fatigue  Abnormality of gait  Stiffness of shoulder joint, left  Unstable balance  At risk for falls  Decreased activity tolerance      Subjective Assessment - 07/23/15 1105    Subjective Patient started a new chemotherapy which is in pill form.  She has increased fatigue because of the new medication.     Pertinent History Pt has recently been dischared from the hospital with pneumonia .Pt fractured her Lt shoulder after radiation and still does not have full use of her arm.  Pt is a 50 y/o female with a diagnosis of Stage IV Breast Cancer, diagnosed in December 2015. She has had three types of radiation which ended in the summer of 2016 and is currently taking chemo radiation.  Pt presents for cancer rehabilitation evaluation this date due to fatigue and weakness. Dr. Ancil Linsey referred pt to physical  therapy for evaluation and treatment.    Currently in Pain? No/denies                         [facility] Adult PT Treatment/Exercise - 07/23/15 0001    Balance Poses: Yoga   Tree Pose 2 reps;30 seconds   Exercises   Exercises Shoulder;Knee/Hip   Knee/Hip Exercises: Stretches   Piriformis Stretch Both;1 rep;60 seconds   Knee/Hip Exercises: Aerobic   [place] 3; Level 3  x 10:00   Knee/Hip Exercises: Standing   SLS with Vectors 10" x 3    Knee/Hip Exercises: Seated   Sit to Sand 10 reps   Knee/Hip Exercises: Supine   Bridges 15 reps   Knee/Hip Exercises: Prone   Other Prone  Exercises quadriped opposite arm and leg x 5 reps each    Shoulder Exercises: Supine   Other Supine Exercises pulley for flexion and abduction x 2' each    Shoulder Exercises: Sidelying   External Rotation Strengthening;Left;10 reps;Weights   External Rotation Weight (lbs) 2   ABduction Strengthening;Left;10 reps;Weights     nustep hills 3 level 3 x 10"        Balance Exercises - 07/23/15 1132    Balance Exercises: Standing   Tandem Gait Forward;Foam/compliant surface;2 reps   Retro Gait Foam/compliant surface;2 reps   Sidestepping 2 reps;Theraband           PT Education - 07/23/15 1148    Education provided Yes   Education Details for stretches.    Person(s) Educated Patient   Methods Explanation;Demonstration;Verbal cues   Comprehension Returned demonstration          PT Short Term Goals - 07/18/15 1345    PT SHORT TERM GOAL #1   Title PT will improve left shoulder ROM by 15 degrees to allow ease of donning of clothing    Time 4   Period Weeks   Status On-going   PT SHORT TERM GOAL #2   Title Pt will improve single leg stance to 20 seconds to allow pt to be ablet to ambulate for 1300 ft without lose of balance for improved confidence for community ambulation.    Time 4   Period Weeks   Status On-going   PT SHORT TERM GOAL #3   Title Pt to report back and shoulder pain are no greater than a 1/10 to assist patient with sleep    Time 4   Period Weeks   Status On-going   PT SHORT TERM GOAL #4   Title Pt VAS fatige to be no greater than a 4 to allow pt to complete 1 to 2 hours of housework without having to rest    Time 4   Period Weeks   Status On-going           PT Long Term Goals - 07/18/15 1345    PT LONG TERM GOAL #1   Title Pt Shoulder ROM to be within functional limits to allow pt to lift plates into higher cabinets.    Time 8   Period Weeks   Status On-going   PT LONG TERM GOAL #2   Title Pt core and LE strength to be 5/5 to allow pt to walk  on uneven surfaces for 30 minutes for pt to be able to walk on her property without fear of  falling.    Time 8   Period Weeks   Status On-going   PT LONG TERM GOAL #3   Title Pt UE strength to improve to allow pt to be able bring in groceries    Time 8   Period Weeks   Status On-going   PT LONG TERM GOAL #4   Title Pt fatigue to be no greater than a 2 to allow pt to be up and completing functional activites , ( housecleaning, shopping or working out in the barn) for 3 hours without having to rest.    Time 8   Period Weeks   Status On-going   PT LONG TERM GOAL #5   Title Pt to be able to single leg stance for 30 seconds to allow pt to walk for 15 minutes without having to self correct loss of balance and to allow pt to report that she has not had a fall in the past 4 weeks.    Time 8   Period Weeks   Status On-going               Plan - 07/23/15 1149    Clinical Impression Statement Added balance activity with contact guard supervision for safety.  Added sidelying strengtheing for Lt UE. Pt limited with opposite arm/leg activity due to knee pain.  Pt instructed and given stretching exercises to hip/back.    Pt will benefit from skilled therapeutic intervention in order to improve on the following deficits Abnormal gait;Decreased activity tolerance;Decreased balance;Decreased range of motion;Decreased strength;Difficulty walking;Pain   PT Next Visit Plan Begin warrior I and II as well as wall squats with ball.         Problem List Patient Active Problem List   Diagnosis Date Noted  . CAP (community acquired pneumonia)   . Esophageal reflux   . Metastatic breast cancer ([place])   . HCAP (healthcare-associated pneumonia) 06/02/2015  . Pneumonia 06/02/2015  . Drug-induced cardiomyopathy ([place]) 02/15/2015  . Brain metastases ([place]) 01/29/2015  . Hot flashes due to tamoxifen 09/25/2014  . Genetic testing 09/05/2014  . Family history of prostate cancer   . GERD (gastroesophageal  reflux disease) 07/16/2014  . DVT (deep venous thrombosis) ([place]) 07/09/2014  . Bone metastases ([place]) 05/23/2014  . Breast cancer, stage 4 ([place]) 05/16/2014  . Hyperlipidemia 04/23/2014  . Depression 04/23/2014   Rayetta Humphrey, PT CLT [phone] 07/23/2015, 11:54 AM  [place], [place], [address] Phone: [phone]   Fax:  [phone]  Name: [name] MRN: [id number] Date of Birth: 22-Oct-1965

## 2015-07-25 ENCOUNTER — Encounter (HOSPITAL_BASED_OUTPATIENT_CLINIC_OR_DEPARTMENT_OTHER): Payer: 59

## 2015-07-25 ENCOUNTER — Encounter (HOSPITAL_BASED_OUTPATIENT_CLINIC_OR_DEPARTMENT_OTHER): Payer: 59 | Admitting: Hematology & Oncology

## 2015-07-25 ENCOUNTER — Ambulatory Visit (HOSPITAL_COMMUNITY): Payer: 59 | Admitting: Physical Therapy

## 2015-07-25 ENCOUNTER — Encounter (HOSPITAL_COMMUNITY): Payer: Self-pay | Admitting: Hematology & Oncology

## 2015-07-25 VITALS — BP 123/72 | HR 82 | Temp 97.7°F | Resp 18 | Wt 250.3 lb

## 2015-07-25 DIAGNOSIS — R530 Neoplastic (malignant) related fatigue: Secondary | ICD-10-CM | POA: Diagnosis not present

## 2015-07-25 DIAGNOSIS — Z5112 Encounter for antineoplastic immunotherapy: Secondary | ICD-10-CM

## 2015-07-25 DIAGNOSIS — C787 Secondary malignant neoplasm of liver and intrahepatic bile duct: Secondary | ICD-10-CM | POA: Diagnosis not present

## 2015-07-25 DIAGNOSIS — C50919 Malignant neoplasm of unspecified site of unspecified female breast: Secondary | ICD-10-CM

## 2015-07-25 DIAGNOSIS — C7951 Secondary malignant neoplasm of bone: Secondary | ICD-10-CM

## 2015-07-25 DIAGNOSIS — G8929 Other chronic pain: Secondary | ICD-10-CM

## 2015-07-25 DIAGNOSIS — R5382 Chronic fatigue, unspecified: Secondary | ICD-10-CM

## 2015-07-25 DIAGNOSIS — M25512 Pain in left shoulder: Secondary | ICD-10-CM | POA: Diagnosis not present

## 2015-07-25 DIAGNOSIS — C229 Malignant neoplasm of liver, not specified as primary or secondary: Secondary | ICD-10-CM | POA: Diagnosis not present

## 2015-07-25 DIAGNOSIS — Z79899 Other long term (current) drug therapy: Secondary | ICD-10-CM

## 2015-07-25 DIAGNOSIS — C7931 Secondary malignant neoplasm of brain: Secondary | ICD-10-CM | POA: Diagnosis not present

## 2015-07-25 LAB — COMPREHENSIVE METABOLIC PANEL
ALK PHOS: 36 U/L — AB (ref 38–126)
ALT: 18 U/L (ref 14–54)
ANION GAP: 7 (ref 5–15)
AST: 20 U/L (ref 15–41)
Albumin: 3.5 g/dL (ref 3.5–5.0)
BILIRUBIN TOTAL: 0.2 mg/dL — AB (ref 0.3–1.2)
BUN: 12 mg/dL (ref 6–20)
CALCIUM: 8.6 mg/dL — AB (ref 8.9–10.3)
CO2: 30 mmol/L (ref 22–32)
Chloride: 102 mmol/L (ref 101–111)
Creatinine, Ser: 0.69 mg/dL (ref 0.44–1.00)
Glucose, Bld: 81 mg/dL (ref 65–99)
Potassium: 3.7 mmol/L (ref 3.5–5.1)
SODIUM: 139 mmol/L (ref 135–145)
TOTAL PROTEIN: 6.4 g/dL — AB (ref 6.5–8.1)

## 2015-07-25 LAB — CBC WITH DIFFERENTIAL/PLATELET
Basophils Absolute: 0 10*3/uL (ref 0.0–0.1)
Basophils Relative: 0 %
EOS ABS: 0.1 10*3/uL (ref 0.0–0.7)
Eosinophils Relative: 2 %
HEMATOCRIT: 36.4 % (ref 36.0–46.0)
HEMOGLOBIN: 11.9 g/dL — AB (ref 12.0–15.0)
LYMPHS ABS: 0.9 10*3/uL (ref 0.7–4.0)
Lymphocytes Relative: 18 %
MCH: 29.8 pg (ref 26.0–34.0)
MCHC: 32.7 g/dL (ref 30.0–36.0)
MCV: 91 fL (ref 78.0–100.0)
MONOS PCT: 10 %
Monocytes Absolute: 0.5 10*3/uL (ref 0.1–1.0)
NEUTROS PCT: 70 %
Neutro Abs: 3.4 10*3/uL (ref 1.7–7.7)
Platelets: 211 10*3/uL (ref 150–400)
RBC: 4 MIL/uL (ref 3.87–5.11)
RDW: 14.2 % (ref 11.5–15.5)
WBC: 4.8 10*3/uL (ref 4.0–10.5)

## 2015-07-25 MED ORDER — ACETAMINOPHEN 325 MG PO TABS
ORAL_TABLET | ORAL | Status: AC
  Filled 2015-07-25: qty 2

## 2015-07-25 MED ORDER — DIPHENHYDRAMINE HCL 25 MG PO CAPS
50.0000 mg | ORAL_CAPSULE | Freq: Once | ORAL | Status: AC
  Administered 2015-07-25: 50 mg via ORAL

## 2015-07-25 MED ORDER — TRASTUZUMAB CHEMO INJECTION 440 MG
6.0000 mg/kg | Freq: Once | INTRAVENOUS | Status: AC
  Administered 2015-07-25: 672 mg via INTRAVENOUS
  Filled 2015-07-25: qty 32

## 2015-07-25 MED ORDER — SODIUM CHLORIDE 0.9 % IV SOLN
Freq: Once | INTRAVENOUS | Status: AC
  Administered 2015-07-25: 09:00:00 via INTRAVENOUS

## 2015-07-25 MED ORDER — HEPARIN SOD (PORK) LOCK FLUSH 100 UNIT/ML IV SOLN
INTRAVENOUS | Status: AC
  Filled 2015-07-25: qty 5

## 2015-07-25 MED ORDER — SODIUM CHLORIDE 0.9 % IJ SOLN
10.0000 mL | INTRAMUSCULAR | Status: DC | PRN
  Administered 2015-07-25: 10 mL
  Filled 2015-07-25: qty 10

## 2015-07-25 MED ORDER — HEPARIN SOD (PORK) LOCK FLUSH 100 UNIT/ML IV SOLN
500.0000 [IU] | Freq: Once | INTRAVENOUS | Status: AC
  Administered 2015-07-25: 500 [IU] via INTRAVENOUS

## 2015-07-25 MED ORDER — METHYLPHENIDATE HCL 10 MG PO TABS: ORAL_TABLET | ORAL | Status: DC

## 2015-07-25 MED ORDER — DIPHENHYDRAMINE HCL 25 MG PO CAPS
ORAL_CAPSULE | ORAL | Status: AC
  Filled 2015-07-25: qty 2

## 2015-07-25 MED ORDER — ACETAMINOPHEN 325 MG PO TABS
650.0000 mg | ORAL_TABLET | Freq: Once | ORAL | Status: AC
  Administered 2015-07-25: 650 mg via ORAL

## 2015-07-25 MED ORDER — HEPARIN SOD (PORK) LOCK FLUSH 100 UNIT/ML IV SOLN: 500.0000 [IU] | Freq: Once | INTRAVENOUS | Status: AC | PRN

## 2015-07-25 NOTE — Patient Instructions (Addendum)
[place] at [facility] Discharge Instructions  RECOMMENDATIONS MADE BY THE CONSULTANT AND ANY TEST RESULTS WILL BE SENT TO YOUR REFERRING PHYSICIAN.   Exam and discussion by Dr Whitney Muse today Ritalin refilled  Zoladex monthly  X-geva monthly with labs prior Herceptin every 3 weeks  MUGA scan scheduled MRI of your shoulder scheduled  Return to see the doctor in 3 weeks  Please call the clinic if you have any questions or concerns    Thank you for choosing [place] at [facility] to provide your oncology and hematology care.  To afford each patient quality time with our provider, please arrive at least 15 minutes before your scheduled appointment time.   Beginning January 23rd 2017 lab work for the [facility] will be done in the  Main lab at [facility] on 1st floor. If you have a lab appointment with the [facility] please come in thru the  Main Entrance and check in at the main information desk  You need to re-schedule your appointment should you arrive 10 or more minutes late.  We strive to give you quality time with our providers, and arriving late affects you and other patients whose appointments are after yours.  Also, if you no show three or more times for appointments you may be dismissed from the clinic at the providers discretion.     Again, thank you for choosing [facility].  Our hope is that these requests will decrease the amount of time that you wait before being seen by our physicians.       _____________________________________________________________  Should you have questions after your visit to [facility], please contact our office at [phone] between the hours of 8:30 a.m. and 4:30 p.m.  Voicemails left after 4:30 p.m. will not be returned until the following business day.  For prescription refill requests, have your pharmacy contact our office.         Resources For Cancer  Patients and their Caregivers ? American Cancer Society: Can assist with transportation, wigs, general needs, runs Look Good Feel Better.        204-612-1128 ? Cancer Care: Provides financial assistance, online support groups, medication/co-pay assistance.  1-800-813-HOPE (470) 414-0817) ? [facility] Assists [place] cancer patients and their families through emotional , educational and financial support.  414-669-9934 ? [place] DSS Where to apply for food stamps, Medicaid and utility assistance. 857-852-0155 ? RCATS: Transportation to medical appointments. 631-626-1090 ? Social Security Administration: May apply for disability if have a Stage IV cancer. [phone] ? [employer], Disability and Transit Services: Assists with nutrition, care and transit needs. [phone]

## 2015-07-25 NOTE — Progress Notes (Signed)
Allison Labrum, MD [place] [address]    Breast cancer, stage 4 ([place])   04/25/2014 Initial Diagnosis Breast cancer, stage 4   04/25/2014 Imaging CT abdomen/pelvia with widespread metastatic disease to the liver, multiple lytic lesions throughout spine and pelvis. No FX or epidural tumor identified   04/26/2014 Imaging CT head unremarkable   04/26/2014 Imaging CT chest with no lung mass or pulmonary nodules, no adenopathy. Lytic bone lesions, right 2nd rib   04/27/2014 Initial Biopsy U/S guided liver biopsy, lesion in anterior and inferior left hepatic lobe biopsied   04/27/2014 Pathology Results Metastatic adenocarcinoma, CK7, ER+, patchy positivity with PR. Possible primary includes breast, less likely gynecologic   05/15/2014 Mammogram BI-RADS CATEGORY  2: Benign Finding(s)   05/16/2014 PET scan 1. Intensely hypermetabolic hepatic metastasis. 2. Widespread hypermetabolic skeletal lesions. 3. No primary adenocarcinoma identified by FDG PET imaging.   05/19/2014 Imaging MUGA- Left ventricular ejection fracture greater than 70%.   05/21/2014 Breast MRI No suspicious masses or enhancement within the breasts. No axillary adenopathy.   05/22/2014 - 07/03/2014 Antibody Plan Herceptin/Perjeta/Tamoxifen   06/12/2014 - 07/03/2014 Chemotherapy Taxotere added secondary to persistent abdominal and back pain   06/17/2014 - 06/19/2014 Hospital Admission Neutropenia, fever, diarrhea, nausea, vomiting   06/20/2014 - 07/10/2014 Radiation Therapy Dr. Thea Silversmith 12 fractions to L3-S3 (30 Gy) and left scapula (20 Gy).    07/03/2014 Adverse Reaction Perjeta- induced diarrhea.  Perjeta discontinued   07/16/2014 - 07/20/2014 Hospital Admission Electrolyte abnormalities, and diarrhea.  Suspect Perjeta-induced diarrhea.  Negative GI work-up.   07/24/2014 -  Chemotherapy Herceptin/Tamoxifen/Xgeva   08/21/2014 Imaging MUGA- Left ventricular ejection fraction equals 71%.   08/24/2014 PET scan Dramatic reduction in  metabolic activity of the widespread liver metastasis. Liver metastasis now have metabolic activity equal to background normal liver activity. Liver has a nodular contour. Marked reduction in metabolic activity of skeletal lesions..   10/05/2014 Progression Widespread metastatic disease to the brain as described. Between 20 and 30 intracranial metastatic deposits are now seen. No midline shift or incipient herniation   10/09/2014 - 10/26/2014 Radiation Therapy Whole Brain XRT   11/14/2014 Imaging MUGA- LVEF 67%   02/13/2015 Imaging MUGA- LVEF 59%   02/15/2015 Treatment Plan Change Due to declining LVEF, will hold Herceptin per PI guidelines.   04/12/2015 -  Chemotherapy Herceptin restarted   06/02/2015 - 06/08/2015 Hospital Admission Pneumonia   07/05/2015 Progression  PET/CT concern for mild progression of skeletal metastasis with several lesions within the spine and 1 lesion in the Left iliac wing with mild to moderate metabolic activity new from prior. Rising CA 27-29   07/16/2015 Imaging MRI brain with satisfactory post treatment apperance of brain. interval resolved enhancing R caudate metastasis, minimal punctate residual enhancing metastatic disease at the inferior L cerebellum. No new metastatic disease or new intracranial abnormality   07/19/2015 Treatment Plan Change Discontinue Tamoxifen, Zoladex plus Arimidex.    Stage IV adenocarcinoma with metastases to liver and bone  Had last mammogram in [place] this year, Dr. Gaetano Net  Colonoscopy 05/18/2013  Breast Reduction 03/17/2011  CURRENT THERAPY: Herceptin/Arimidex/Zoladex/XGEVA  INTERVAL HISTORY: [name] 50 y.o. female returns for follow-up of stage IV adenocarcinoma of the breast, ER+, HER 2 + disease. [name] was here with husband today. She was in a treatment chair getting cycle #19 of Herceptin.   She has been going to physical therapy twice a week with her husband. She said she feels like she's at the "  Biggest Loser" tv show when  she goes. She says that they "work her so hard"  She has been having hot flashes again but says that they are not that bad. This has started with Zoladex.  Her main thing that is bothering her is her left shoulder. "It hurts so bad" and it constantly bothers her. She notes that it limits what she can do with her left arm. She has been feeling okay but she gets tired sometimes. When this happens she sits and rests.   She denies chest pain, dizzy, headaches, and swelling in her legs. Her bowels are okay her stomach has been hurting but it's not that bad. Her appetite is getting better.  She has been going to hospice every week and volunteering. She is active in our support group here at [facility].  She has started taking Arimidex. She does not need any prescription refills. She has an upcoming appointment with her ob/gyn.  MEDICAL HISTORY: Past Medical History  Diagnosis Date  . PONV (postoperative nausea and vomiting)     pt states scope patch does well  . Anemia   . Depression   . Anxiety   . Headache(784.0)     has migraines - medication controls  . Family history of prostate cancer   . Hot flashes due to tamoxifen 09/25/2014  . Breast cancer ([place]) 04/2014    Presumed dx; stage 4 w/ mets to bone and liver, brain lesions  . Bony metastasis ([place])   . S/P small bowel resection   . Drug-induced cardiomyopathy ([place]) 02/15/2015    has Hyperlipidemia; Depression; Breast cancer, stage 4 ([place]); Bone metastases ([place]); DVT (deep venous thrombosis) ([place]); GERD (gastroesophageal reflux disease); Family history of prostate cancer; Genetic testing; Hot flashes due to tamoxifen; Brain metastases ([place]); Drug-induced cardiomyopathy ([place]); HCAP (healthcare-associated pneumonia); Pneumonia; Metastatic breast cancer ([place]); CAP (community acquired pneumonia); and Esophageal reflux on her problem list.      has No Known Allergies.  [name] had no medications administered during this  visit.  SURGICAL HISTORY: Past Surgical History  Procedure Laterality Date  . Right rotator cuff      2002  . Neck fusion      2003  . Laparoscopic cholecystectomy      2004  . Right knee arthroscopy      2005  . Appendectomy      2008  . Abdominal hysterectomy    . Breast reduction surgery  03/17/2011    Procedure: MAMMARY REDUCTION BILATERAL (BREAST);  Surgeon: Mary A Contogiannis;  Location: [facility];  Service: Plastics;  Laterality: Bilateral;  . Colonoscopy N/A 07/13/2013    Procedure: COLONOSCOPY;  Surgeon: Rogene Houston, MD;  [facility];  Service: Endoscopy;  Laterality: N/A;  930  . Liver biopsy  04/2014  . Portacath placement    . Esophagogastroduodenoscopy N/A 05/25/2014    Procedure: ESOPHAGOGASTRODUODENOSCOPY (EGD);  Surgeon: Rogene Houston, MD;  [facility];  Service: Endoscopy;  Laterality: N/A;  155  . Laparoscopic appendectomy    . Colonoscopy N/A 11/26/2014    Procedure: COLONOSCOPY;  Surgeon: Rogene Houston, MD;  [facility];  Service: Endoscopy;  Laterality: N/A;  730    SOCIAL HISTORY: Social History   Social History  . Marital Status: Married    Spouse Name: N/A  . Number of Children: N/A  . Years of Education: N/A   Occupational History  . Not on file.   Social  History Main Topics  . Smoking status: Former Smoker    Types: Cigarettes    Quit date: 08/20/1994  . Smokeless tobacco: Never Used  . Alcohol Use: Yes     Comment: Occasionally  . Drug Use: No  . Sexual Activity: Yes    Birth Control/ Protection: Surgical   Other Topics Concern  . Not on file   Social History Narrative    FAMILY HISTORY: Family History  Problem Relation Age of Onset  . Diabetes Father   . Heart attack Maternal Grandmother 30    multiple over lifetime.  . Cancer Maternal Grandmother 55    NOS  . Prostate cancer Maternal Grandfather     dx in his 73s  . Lung cancer Paternal Grandfather     dx <50  .  Lymphoma Maternal Aunt     dx in her 37s  . Melanoma Cousin 11    maternal first cousin  . Brain cancer Cousin     paternal first cousin dx under 26  . Prostate cancer Other     MGF's father  . Colon cancer Other     MGM's mother  Her son lives in [place]. Her daughter lives in [place].  Review of Systems  Constitutional: Positive for hot flashes. Negative for fever, chills, weight loss.  HENT: . Negative for congestion, hearing loss, sore throat and tinnitus.   Eyes: Negative for double vision, pain and discharge.  Respiratory:  Negative for cough, hemoptysis, sputum production, shortness of breath and wheezing.   Cardiovascular: Negative for chest pain, palpitations, claudication, and PND.  Gastrointestinal: Positive for abdominal pain Negative for heartburn, nausea, diarrhea, constipation, blood in stool and melena, vomiting.  Genitourinary:Negative for dysuria, urgency, frequency and hematuria.  Musculoskeletal: Positive for joint pain, shoulder pain, and back pain. Negative for myalgias and falls.  Left shoulder pain Skin: Negative for itching and rash.  Neurological: . Negative for tingling, tremors, speech change, focal weakness, seizures, loss of consciousness, weakness. Headaches or blurry vision Endo/Heme/Allergies: Does not bruise/bleed easily.  Psychiatric/Behavioral: Positive for short term memory loss. Negative for suicidal ideas, and substance abuse, nervous/axiety, depression. Memory loss attributed to whole brain radiation.  14 point review of systems was performed and is negative except as detailed under history of present illness and above  PHYSICAL EXAMINATION  ECOG PERFORMANCE STATUS: 1 - Symptomatic but completely ambulatory Vitals with BMI 07/25/2015  Height   Weight 250 lbs 5 oz  BMI   Systolic 287  Diastolic 72  Pulse 82  Respirations 18     Physical Exam  Constitutional: She is oriented to person, place, and time and well-developed,  well-nourished, and in no distress. Facial Cushingoid features (Improved).  Mood is good. Her cheeks appear thinner.  HENT:  Head: Normocephalic and atraumatic. Hair regrowth. Nose: Nose normal.  Mouth/Throat:  Negative Eyes: Conjunctivae and EOM are normal. Pupils are equal, round, and reactive to light. Right eye exhibits no discharge. Left eye exhibits no discharge. No scleral icterus.  Neck: Normal range of motion. No tracheal deviation present. No thyromegaly present.  Cardiovascular: Normal rate, regular rhythm and normal heart sounds.  Exam reveals no gallop and no friction rub.   No murmur heard. Pulmonary/Chest: Effort normal and breath sounds normal. She has no wheezes. She has no rales.  Abdominal: Soft. Bowel sounds are normal. She exhibits no distension and no mass. There is no tenderness. There is no rebound and no guarding.  Musculoskeletal: Normal range of motion.  Lymphadenopathy:  She has no cervical adenopathy.  Neurological: She is alert and oriented to person, place, and time. She has normal reflexes. No cranial nerve deficit. Gait normal. Coordination normal.  Skin: Skin is warm and dry. No rash noted.  Psychiatric: Mood, memory, affect and judgment normal.  Nursing note and vitals reviewed.  LABORATORY DATA: I have reviewed the data as listed.  CBC    Component Value Date/Time   WBC 4.8 07/25/2015 0915   RBC 4.00 07/25/2015 0915   HGB 11.9* 07/25/2015 0915   HCT 36.4 07/25/2015 0915   PLT 211 07/25/2015 0915   MCV 91.0 07/25/2015 0915   MCH 29.8 07/25/2015 0915   MCHC 32.7 07/25/2015 0915   RDW 14.2 07/25/2015 0915   LYMPHSABS 0.9 07/25/2015 0915   MONOABS 0.5 07/25/2015 0915   EOSABS 0.1 07/25/2015 0915   BASOSABS 0.0 07/25/2015 0915   CMP     Component Value Date/Time   NA 139 07/25/2015 0915   K 3.7 07/25/2015 0915   CL 102 07/25/2015 0915   CO2 30 07/25/2015 0915   GLUCOSE 81 07/25/2015 0915   BUN 12 07/25/2015 0915   CREATININE 0.69  07/25/2015 0915   CALCIUM 8.6* 07/25/2015 0915   PROT 6.4* 07/25/2015 0915   ALBUMIN 3.5 07/25/2015 0915   AST 20 07/25/2015 0915   ALT 18 07/25/2015 0915   ALKPHOS 36* 07/25/2015 0915   BILITOT 0.2* 07/25/2015 0915   GFRNONAA >60 07/25/2015 0915   GFRAA >60 07/25/2015 0915   Results for [name], [name] (MRN [id number]) as of 07/26/2015 21:01  Ref. Range 02/15/2015 11:03 04/12/2015 10:39 05/03/2015 11:44 06/13/2015 10:40 07/25/2015 09:15  CA 27.29 Latest Ref Range: 0.0-38.6 U/mL 27.8 27.0 22.8 42.0 (H) 21.9    RADIOLOGY: I have personally reviewed the radiological images as listed and agreed with the findings in the report.  CLINICAL DATA:  Subsequent treatment strategy for breast cancer with bone metastasis.   EXAM: NUCLEAR MEDICINE PET SKULL BASE TO THIGH   TECHNIQUE: 12.5 mCi F-18 FDG was injected intravenously. Full-ring PET imaging was performed from the skull base to thigh after the radiotracer. CT data was obtained and used for attenuation correction and anatomic localization.   FASTING BLOOD GLUCOSE:  Value: 93 mg/dl   COMPARISON:  PET-CT 03/29/2015   FINDINGS: NECK   No hypermetabolic lymph nodes in the neck.   CHEST   No hypermetabolic mediastinal or hilar nodes. No suspicious pulmonary nodules on the CT scan.   ABDOMEN/PELVIS   No abnormal hypermetabolic activity within the liver, pancreas, adrenal glands, or spleen. No hypermetabolic lymph nodes in the abdomen or pelvis.   Liver has a nodular contour.  No focal lesion.   SKELETON   There are 2 new foci of radiotracer uptake within the skeleton. One within the LEFT iliac bone with SUV max equal 5.5. Second within the T7 vertebral body. Several additional foci of mild metabolic activity may be increased in spine. Multiple sclerotic lesions the throughout the spine and pelvis are not hypermetabolic and not changed in size.   IMPRESSION: 1. Concern for mild progression of skeletal metastasis with  this several lesions within the spine and 1 lesion in the LEFT iliac wing with mild to moderate metabolic activity new from prior. 2. Majority of the metastasis sclerotic skeletal metastasis are not hypermetabolic and not changed in size. 3. No evidence of soft tissue metastasis.     Electronically Signed   By: Suzy Bouchard M.D.   On: 07/05/2015 10:40 CLINICAL  DATA:  50 year old female with stage IV breast cancer, including brain and bone metastases status post whole brain radiation therapy in May and June 2016. Restaging. Subsequent encounter.   EXAM: MRI HEAD WITHOUT AND WITH CONTRAST   TECHNIQUE: Multiplanar, multiecho pulse sequences of the brain and surrounding structures were obtained without and with intravenous contrast.   CONTRAST:  18m MULTIHANCE GADOBENATE DIMEGLUMINE 529 MG/ML IV SOLN   COMPARISON:  Head CT without contrast 06/02/2015. Brain MRI 03/29/2015 and earlier.   FINDINGS: Since November the residual right caudate metastasis is no longer enhancing. Minimal residual enhancement of left inferior cerebellar metastases (series 10, image 29), stable. No other residual enhancing metastasis identified. No new brain metastasis identified. No dural thickening. No leptomeningeal enhancement identified.   Cerebral volume is stable. Stable mild residual FLAIR hyperintensity at the site of the larger treated metastases. No acute cerebral edema or new signal abnormality since November.   No restricted diffusion to suggest acute infarction. No midline shift, mass effect, ventriculomegaly, extra-axial collection or acute intracranial hemorrhage. Occasional punctate areas of hemosiderin in the brain are stable. Major intracranial vascular flow voids are stable.   Cervicomedullary junction and pituitary are within normal limits. Negative visualized cervical spine and spinal cord. Visible bone marrow signal is stable with no destructive osseous  lesion identified.   Stable mastoid effusions. Negative nasopharynx. Minimal paranasal sinus mucosal thickening. Stable orbit and scalp soft tissues.   IMPRESSION: 1. Satisfactory post treatment appearance of the brain. Interval resolved enhancing right caudate metastasis, and minimal punctate residual enhancing metastatic disease at the inferior left cerebellum. 2. No new metastatic disease or new intracranial abnormality.     Electronically Signed   By: HGenevie AnnM.D.   On: 07/16/2015 09:20    ASSESSMENT and THERAPY PLAN:  Stage IV ER positive, HER-2 positive carcinoma of the breast Widespread Brain Metastases End of Life issues Insomnia Anxiety UE DVT, RIJ and R subclavian Cancer related Fatigue Decline in EF, Herceptin held. Herceptin restarted on 04/12/2015 06/2014 progression of disease (bone only) with rise in tumor markers, abnormal PET  She has an upcoming appointment with her ob/gyn. She has had a hysterectomy but still has her ovaries. It would be ideal if she could have an oophorectomy, I will call her gynecologist prior to her appointment. We spent time today discussing our changes to her treatment plan. We will continue herceptin for now.   I have scheduled an MRI of the shoulder for further evaluation of her pain. She is due for another MUGA at the end of the month and we will order this today.  She will return for a follow up on 08/15/15.   I have refilled her ritalin. She does not need any other refills at this time.   Orders Placed This Encounter  Procedures  . MR Shoulder Left W Wo Contrast    Standing Status: Future     Number of Occurrences:      Standing Expiration Date: 09/23/2016    Order Specific Question:  If indicated for the ordered procedure, I authorize the administration of contrast media per Radiology protocol    Answer:  Yes    Order Specific Question:  Reason for Exam (SYMPTOM  OR DIAGNOSIS REQUIRED)    Answer:  severe shoulder pain, limited  ROM    Order Specific Question:  Preferred imaging location?    Answer:  [facility](table limit-350lbs)    Order Specific Question:  What is the patient's sedation requirement?  Answer:  No Sedation    Order Specific Question:  Does the patient have a pacemaker or implanted devices?    Answer:  No  . NM Cardiac [facility] Rest    Standing Status: Future     Number of Occurrences:      Standing Expiration Date: 07/24/2016    Order Specific Question:  Reason for Exam (SYMPTOM  OR DIAGNOSIS REQUIRED)    Answer:  high risk chemotherapy    Order Specific Question:  Is the patient pregnant?    Answer:  No    Order Specific Question:  Preferred imaging location?    Answer:  [facility]    Order Specific Question:  If indicated for the ordered procedure, I authorize the administration of a radiopharmaceutical per Radiology protocol    Answer:  Yes  . Comprehensive metabolic panel    Standing Status: Standing     Number of Occurrences: 10     Standing Expiration Date: 07/24/2016    All questions were answered. The patient knows to call the clinic with any problems, questions or concerns. We can certainly see the patient much sooner if necessary.   This note was signed electronically.  This document serves as a record of services personally performed by Ancil Linsey, MD. It was created on her behalf by Kandace Blitz, a trained medical scribe. The creation of this record is based on the scribe's personal observations and the provider's statements to them. This document has been checked and approved by the attending provider.  I have reviewed the above documentation for accuracy and completeness, and I agree with the above.  Kelby Fam. Whitney Muse, MD

## 2015-07-26 ENCOUNTER — Ambulatory Visit (HOSPITAL_COMMUNITY): Payer: 59 | Admitting: Physical Therapy

## 2015-07-26 DIAGNOSIS — R2689 Other abnormalities of gait and mobility: Secondary | ICD-10-CM

## 2015-07-26 DIAGNOSIS — R531 Weakness: Secondary | ICD-10-CM | POA: Diagnosis not present

## 2015-07-26 DIAGNOSIS — Z9181 History of falling: Secondary | ICD-10-CM

## 2015-07-26 DIAGNOSIS — R530 Neoplastic (malignant) related fatigue: Secondary | ICD-10-CM

## 2015-07-26 DIAGNOSIS — M25612 Stiffness of left shoulder, not elsewhere classified: Secondary | ICD-10-CM

## 2015-07-26 DIAGNOSIS — R6889 Other general symptoms and signs: Secondary | ICD-10-CM

## 2015-07-26 DIAGNOSIS — R269 Unspecified abnormalities of gait and mobility: Secondary | ICD-10-CM

## 2015-07-26 LAB — CANCER ANTIGEN 27.29: CA 27.29: 21.9 U/mL (ref 0.0–38.6)

## 2015-07-26 NOTE — Therapy (Signed)
[place], [place], [address] Phone: [phone]   Fax:  [phone]  Physical Therapy Treatment  Patient Details  Name: [name] MRN: [id number] Date of Birth: March 04, 1966 Referring Provider: Ancil Linsey  Encounter Date: 07/26/2015      PT End of Session - 07/26/15 1240    Visit Number 5   Number of Visits 16   Date for PT Re-Evaluation 08/03/15   Authorization Type united health care   Authorization - Visit Number 5   Authorization - Number of Visits 10   PT Start Time 0933   PT Stop Time 1016   PT Time Calculation (min) 43 min   Equipment Utilized During Treatment Gait belt   Activity Tolerance Patient tolerated treatment well   Behavior During Therapy [facility] for tasks assessed/performed      Past Medical History  Diagnosis Date  . PONV (postoperative nausea and vomiting)     pt states scope patch does well  . Anemia   . Depression   . Anxiety   . Headache(784.0)     has migraines - medication controls  . Family history of prostate cancer   . Hot flashes due to tamoxifen 09/25/2014  . Breast cancer ([place]) 04/2014    Presumed dx; stage 4 w/ mets to bone and liver, brain lesions  . Bony metastasis ([place])   . S/P small bowel resection   . Drug-induced cardiomyopathy ([place]) 02/15/2015    Past Surgical History  Procedure Laterality Date  . Right rotator cuff      2002  . Neck fusion      2003  . Laparoscopic cholecystectomy      2004  . Right knee arthroscopy      2005  . Appendectomy      2008  . Abdominal hysterectomy    . Breast reduction surgery  03/17/2011    Procedure: MAMMARY REDUCTION BILATERAL (BREAST);  Surgeon: Mary A Contogiannis;  Location: [place];  Service: Plastics;  Laterality: Bilateral;  . Colonoscopy N/A 07/13/2013    Procedure: COLONOSCOPY;  Surgeon: Rogene Houston, MD;  [facility];  Service: Endoscopy;  Laterality: N/A;  930  . Liver  biopsy  04/2014  . Portacath placement    . Esophagogastroduodenoscopy N/A 05/25/2014    Procedure: ESOPHAGOGASTRODUODENOSCOPY (EGD);  Surgeon: Rogene Houston, MD;  [facility];  Service: Endoscopy;  Laterality: N/A;  155  . Laparoscopic appendectomy    . Colonoscopy N/A 11/26/2014    Procedure: COLONOSCOPY;  Surgeon: Rogene Houston, MD;  [facility];  Service: Endoscopy;  Laterality: N/A;  730    There were no vitals filed for this visit.  Visit Diagnosis:  Weakness  Neoplastic malignant related fatigue  Abnormality of gait  Stiffness of shoulder joint, left  Unstable balance  At risk for falls  Decreased activity tolerance      Subjective Assessment - 07/26/15 0934    Subjective Feeling really tired, had chemotherapy yesterday. Overall doing pretty good.    Currently in Pain? Yes   Pain Score 2    Pain Location Shoulder   Pain Orientation Left   Pain Descriptors / Indicators Aching   Pain Frequency Constant   Aggravating Factors  reaching    Pain Relieving Factors not move arm  [facility] Adult PT Treatment/Exercise - 07/26/15 0001    Balance Poses: Yoga   Warrior I 3 reps;15 seconds;Limitations   Warrior I Limitations bilaterally with cues   Warrior II 3 reps;15 seconds;Limitations   Warrior II Limitations bilaterally with cues   Tree Pose 2 reps;30 seconds;Other (comment)   Tree Pose Limitations adding looking up for increased challenge   Lumbar Exercises: Supine   Ab Set 15 reps;5 seconds   AB Set Limitations heavy cues needed for technique   Knee/Hip Exercises: Aerobic   Nustep L3 x 6 min   Knee/Hip Exercises: Standing   Other Standing Knee Exercises aeromat tandom walking, min assist needed and pt occasional use of walls for support. 1 length.    Other Standing Knee Exercises Heel-toe with stops, midline crossover walking, each 1X15 feet.    Knee/Hip Exercises: Seated   Sit to Sand 10 reps    Shoulder Exercises: Therapy Ball   Right/Left 10 reps   Right/Left Limitations alternate/opposite UE/LE, cues for neutral spine                PT Education - 07/26/15 1240    Education provided Yes   Education Details Yoga poses (pt reports she has done these in the past)   Person(s) Educated Patient   Methods Explanation;Demonstration;Tactile cues;Verbal cues   Comprehension Verbalized understanding;Returned demonstration          PT Short Term Goals - 07/18/15 1345    PT SHORT TERM GOAL #1   Title PT will improve left shoulder ROM by 15 degrees to allow ease of donning of clothing    Time 4   Period Weeks   Status On-going   PT SHORT TERM GOAL #2   Title Pt will improve single leg stance to 20 seconds to allow pt to be ablet to ambulate for 1300 ft without lose of balance for improved confidence for community ambulation.    Time 4   Period Weeks   Status On-going   PT SHORT TERM GOAL #3   Title Pt to report back and shoulder pain are no greater than a 1/10 to assist patient with sleep    Time 4   Period Weeks   Status On-going   PT SHORT TERM GOAL #4   Title Pt VAS fatige to be no greater than a 4 to allow pt to complete 1 to 2 hours of housework without having to rest    Time 4   Period Weeks   Status On-going           PT Long Term Goals - 07/18/15 1345    PT LONG TERM GOAL #1   Title Pt Shoulder ROM to be within functional limits to allow pt to lift plates into higher cabinets.    Time 8   Period Weeks   Status On-going   PT LONG TERM GOAL #2   Title Pt core and LE strength to be 5/5 to allow pt to walk on uneven surfaces for 30 minutes for pt to be able to walk on her property without fear of falling.    Time 8   Period Weeks   Status On-going   PT LONG TERM GOAL #3   Title Pt UE strength to improve to allow pt to be able bring in groceries    Time 8   Period Weeks   Status On-going   PT LONG TERM GOAL #4   Title Pt fatigue to be no greater  than a  2 to allow pt to be up and completing functional activites , ( housecleaning, shopping or working out in the barn) for 3 hours without having to rest.    Time 8   Period Weeks   Status On-going   PT LONG TERM GOAL #5   Title Pt to be able to single leg stance for 30 seconds to allow pt to walk for 15 minutes without having to self correct loss of balance and to allow pt to report that she has not had a fall in the past 4 weeks.    Time 8   Period Weeks   Status On-going               Plan - 07/26/15 1241    Clinical Impression Statement Incorporating strength and stabilization exercises with balance activities. Patient with decreasing stability with narrowing base of support. Tolerated warrior poses well but observed and reported muscle fatigue present. Modified quadraped position to use Theraball due to Lt shoulder pain. To continue with skilled PT.     PT Next Visit Plan Continue with core stabilization and balance activities. Pt interested in working toward an independent program by end of PT sessions.    PT Home Exercise Plan modify as needed, consider warrior I-II if safe to perform.    Consulted and Agree with Plan of Care Patient        Problem List Patient Active Problem List   Diagnosis Date Noted  . CAP (community acquired pneumonia)   . Esophageal reflux   . Metastatic breast cancer ([place])   . HCAP (healthcare-associated pneumonia) 06/02/2015  . Pneumonia 06/02/2015  . Drug-induced cardiomyopathy ([place]) 02/15/2015  . Brain metastases ([place]) 01/29/2015  . Hot flashes due to tamoxifen 09/25/2014  . Genetic testing 09/05/2014  . Family history of prostate cancer   . GERD (gastroesophageal reflux disease) 07/16/2014  . DVT (deep venous thrombosis) ([place]) 07/09/2014  . Bone metastases ([place]) 05/23/2014  . Breast cancer, stage 4 ([place]) 05/16/2014  . Hyperlipidemia 04/23/2014  . Depression 04/23/2014    Cassell Clement, PT, CSCS Pager [phone]  07/26/2015, 12:47 PM  [place], [place], [address] Phone: [phone]   Fax:  [phone]  Name: [name] MRN: [id number] Date of Birth: 04-25-1966

## 2015-07-30 ENCOUNTER — Ambulatory Visit (HOSPITAL_COMMUNITY): Payer: 59

## 2015-07-30 DIAGNOSIS — R6889 Other general symptoms and signs: Secondary | ICD-10-CM

## 2015-07-30 DIAGNOSIS — M25612 Stiffness of left shoulder, not elsewhere classified: Secondary | ICD-10-CM

## 2015-07-30 DIAGNOSIS — R531 Weakness: Secondary | ICD-10-CM

## 2015-07-30 DIAGNOSIS — R269 Unspecified abnormalities of gait and mobility: Secondary | ICD-10-CM

## 2015-07-30 DIAGNOSIS — Z9181 History of falling: Secondary | ICD-10-CM

## 2015-07-30 DIAGNOSIS — R2689 Other abnormalities of gait and mobility: Secondary | ICD-10-CM

## 2015-07-30 DIAGNOSIS — R530 Neoplastic (malignant) related fatigue: Secondary | ICD-10-CM

## 2015-07-30 NOTE — Patient Instructions (Signed)
Pelvic Tilt: Posterior - Legs Bent (Supine)    Tighten stomach and flatten back by rolling pelvis down. Hold 5-10 seconds. Relax. Repeat 10-20 times per set. Do 1-2 sets per session.   http://orth.exer.us/203   Copyright  VHI. All rights reserved.   PELVIC STABILIZATION: Double Foot Touch    With knees bent over hips and calves parallel to floor, inhale and place toes of both feet on floor. Keep low back pressed to floor. Exhaling, bring both feet back up. Repeat 10-20 times. Do 1-2 times per day.  Copyright  VHI. All rights reserved.

## 2015-07-30 NOTE — Therapy (Signed)
[place], [place], [address] Phone: [phone]   Fax:  [phone]  Physical Therapy Treatment  Patient Details  Name: [name] MRN: [id number] Date of Birth: 05-09-1966 Referring Provider: Ancil Linsey  Encounter Date: 07/30/2015      PT End of Session - 07/30/15 0943    Visit Number 6   Number of Visits 16   Date for PT Re-Evaluation 08/03/15   Authorization Type united health care   Authorization - Visit Number 6   Authorization - Number of Visits 10   PT Start Time 0933   PT Stop Time 1015   PT Time Calculation (min) 42 min   Activity Tolerance Patient tolerated treatment well   Behavior During Therapy [facility] for tasks assessed/performed      Past Medical History  Diagnosis Date  . PONV (postoperative nausea and vomiting)     pt states scope patch does well  . Anemia   . Depression   . Anxiety   . Headache(784.0)     has migraines - medication controls  . Family history of prostate cancer   . Hot flashes due to tamoxifen 09/25/2014  . Breast cancer ([place]) 04/2014    Presumed dx; stage 4 w/ mets to bone and liver, brain lesions  . Bony metastasis ([place])   . S/P small bowel resection   . Drug-induced cardiomyopathy ([place]) 02/15/2015    Past Surgical History  Procedure Laterality Date  . Right rotator cuff      2002  . Neck fusion      2003  . Laparoscopic cholecystectomy      2004  . Right knee arthroscopy      2005  . Appendectomy      2008  . Abdominal hysterectomy    . Breast reduction surgery  03/17/2011    Procedure: MAMMARY REDUCTION BILATERAL (BREAST);  Surgeon: Mary A Contogiannis;  Location: [place];  Service: Plastics;  Laterality: Bilateral;  . Colonoscopy N/A 07/13/2013    Procedure: COLONOSCOPY;  Surgeon: Rogene Houston, MD;  [facility];  Service: Endoscopy;  Laterality: N/A;  930  . Liver biopsy  04/2014  . Portacath placement    .  Esophagogastroduodenoscopy N/A 05/25/2014    Procedure: ESOPHAGOGASTRODUODENOSCOPY (EGD);  Surgeon: Rogene Houston, MD;  [facility];  Service: Endoscopy;  Laterality: N/A;  155  . Laparoscopic appendectomy    . Colonoscopy N/A 11/26/2014    Procedure: COLONOSCOPY;  Surgeon: Rogene Houston, MD;  [facility];  Service: Endoscopy;  Laterality: N/A;  730    There were no vitals filed for this visit.  Visit Diagnosis:  Weakness  Neoplastic malignant related fatigue  Abnormality of gait  Stiffness of shoulder joint, left  At risk for falls  Decreased activity tolerance  Unstable balance      Subjective Assessment - 07/30/15 0939    Subjective Pt stated she has increased soreness lower back, volunteered with hospice yesterday and sat in chiar visiting pt for 3 hours straight.  Continues to have difficulty lifting Bil UE due to shoulder pain Lt> Rt.  Pain scale 5/10 today   Pertinent History Pt has recently been dischared from the hospital with pneumonia .Pt fractured her Lt shoulder after radiation and still does not have full use of her arm.  Pt is a 50 y/o female with a diagnosis of Stage IV Breast Cancer, diagnosed in December 2015. She  has had three types of radiation which ended in the summer of 2016 and is currently taking chemo radiation.  Pt presents for cancer rehabilitation evaluation this date due to fatigue and weakness. Dr. Ancil Linsey referred pt to physical  therapy for evaluation and treatment.    Patient Stated Goals improve fatigue and decrease back pain..  P   Currently in Pain? Yes   Pain Score 5    Pain Location Shoulder   Pain Orientation Left   Pain Descriptors / Indicators Aching   Pain Type Chronic pain   Pain Radiating Towards no radiation   Pain Onset More than a month ago   Pain Frequency Constant   Aggravating Factors  reaching    Pain Relieving Factors not move arm   Effect of Pain on Daily Activities increases   Pain Score  5   Pain Location Back   Pain Orientation Lower   Pain Descriptors / Indicators Sore   Pain Type Chronic pain   Pain Radiating Towards no radicular symptoms   Pain Onset More than a month ago   Pain Frequency Constant   Aggravating Factors  activitiy   Pain Relieving Factors ice   Effect of Pain on Daily Activities increases             [facility] Adult PT Treatment/Exercise - 07/30/15 0001    Balance Poses: Yoga   Warrior I 3 reps;30 seconds   Warrior I Limitations bilaterally with cues   Warrior II 3 reps;30 seconds   Warrior II Limitations bilaterally with cues   Tree Pose 3 reps;15 seconds   Exercises   Exercises Lumbar   Lumbar Exercises: Supine   Ab Set 15 reps;5 seconds   AB Set Limitations heavy cues needed for technique   Bent Knee Raise 10 reps;5 seconds   Bent Knee Raise Limitations for core activation   Lumbar Exercises: Quadruped   Opposite Arm/Leg Raise Right arm/Left leg;Left arm/Right leg;10 reps;5 seconds   Knee/Hip Exercises: Standing   Wall Squat 10 reps   Wall Squat Limitations with ball in corner   SLS with Vectors 3x 10" holds   Shoulder Exercises: Sidelying   External Rotation Strengthening;Left;10 reps;Weights   External Rotation Weight (lbs) 2   ABduction Strengthening;Left;10 reps;Weights   ABduction Weight (lbs) 0           PT Short Term Goals - 07/18/15 1345    PT SHORT TERM GOAL #1   Title PT will improve left shoulder ROM by 15 degrees to allow ease of donning of clothing    Time 4   Period Weeks   Status On-going   PT SHORT TERM GOAL #2   Title Pt will improve single leg stance to 20 seconds to allow pt to be ablet to ambulate for 1300 ft without lose of balance for improved confidence for community ambulation.    Time 4   Period Weeks   Status On-going   PT SHORT TERM GOAL #3   Title Pt to report back and shoulder pain are no greater than a 1/10 to assist patient with sleep    Time 4   Period Weeks   Status On-going   PT  SHORT TERM GOAL #4   Title Pt VAS fatige to be no greater than a 4 to allow pt to complete 1 to 2 hours of housework without having to rest    Time 4   Period Weeks   Status On-going  PT Long Term Goals - 07/18/15 1345    PT LONG TERM GOAL #1   Title Pt Shoulder ROM to be within functional limits to allow pt to lift plates into higher cabinets.    Time 8   Period Weeks   Status On-going   PT LONG TERM GOAL #2   Title Pt core and LE strength to be 5/5 to allow pt to walk on uneven surfaces for 30 minutes for pt to be able to walk on her property without fear of falling.    Time 8   Period Weeks   Status On-going   PT LONG TERM GOAL #3   Title Pt UE strength to improve to allow pt to be able bring in groceries    Time 8   Period Weeks   Status On-going   PT LONG TERM GOAL #4   Title Pt fatigue to be no greater than a 2 to allow pt to be up and completing functional activites , ( housecleaning, shopping or working out in the barn) for 3 hours without having to rest.    Time 8   Period Weeks   Status On-going   PT LONG TERM GOAL #5   Title Pt to be able to single leg stance for 30 seconds to allow pt to walk for 15 minutes without having to self correct loss of balance and to allow pt to report that she has not had a fall in the past 4 weeks.    Time 8   Period Weeks   Status On-going               Plan - 07/30/15 1022    Clinical Impression Statement Session focus on incorporating core and proximal musculature strengthening with balance activities.  Began session with TrA activation and core stabilization exercises to assist with LBP.  Pt reports significant reduction in back pain and requested printout for core exercises, gven HEP printout.  Increased hold time with warrior pose I and II with min cueing required for form.  Decreased stability noted with NBOS activites, unable to increase hold time with tree pose.  Added wall squats wtih ball for gluteal  strengthening with good form following initial demonstration.  End of session pt limited by fatigue, reports back and shoulder pain reduced.     Pt will benefit from skilled therapeutic intervention in order to improve on the following deficits Abnormal gait;Decreased activity tolerance;Decreased balance;Decreased range of motion;Decreased strength;Difficulty walking;Pain   Rehab Potential Good   PT Frequency 2x / week   PT Duration 12 weeks   PT Treatment/Interventions ADLs/Self Care Home Management;Cryotherapy;Moist Heat;Therapeutic exercise;Therapeutic activities;Functional mobility training;Stair training;Gait training;Balance training;Manual techniques;Passive range of motion   PT Next Visit Plan Continue with core stabilization and balance activities. Pt interested in working toward an independent program by end of PT sessions.    PT Home Exercise Plan core strengthening HEP given this session.        Problem List Patient Active Problem List   Diagnosis Date Noted  . CAP (community acquired pneumonia)   . Esophageal reflux   . Metastatic breast cancer ([place])   . HCAP (healthcare-associated pneumonia) 06/02/2015  . Pneumonia 06/02/2015  . Drug-induced cardiomyopathy ([place]) 02/15/2015  . Brain metastases ([place]) 01/29/2015  . Hot flashes due to tamoxifen 09/25/2014  . Genetic testing 09/05/2014  . Family history of prostate cancer   . GERD (gastroesophageal reflux disease) 07/16/2014  . DVT (deep venous thrombosis) ([place]) 07/09/2014  . Bone  metastases ([place]) 05/23/2014  . Breast cancer, stage 4 ([place]) 05/16/2014  . Hyperlipidemia 04/23/2014  . Depression 04/23/2014   Ihor Austin, [place]; [place]  Aldona Lento 07/30/2015, 10:30 AM  [place], [place], [address] Phone: [phone]   Fax:  [phone]  Name: [name] MRN: [id number] Date of Birth: 1966/01/22

## 2015-08-01 ENCOUNTER — Ambulatory Visit (HOSPITAL_COMMUNITY): Payer: 59

## 2015-08-01 DIAGNOSIS — R269 Unspecified abnormalities of gait and mobility: Secondary | ICD-10-CM

## 2015-08-01 DIAGNOSIS — R531 Weakness: Secondary | ICD-10-CM | POA: Diagnosis not present

## 2015-08-01 DIAGNOSIS — M25612 Stiffness of left shoulder, not elsewhere classified: Secondary | ICD-10-CM

## 2015-08-01 DIAGNOSIS — Z9181 History of falling: Secondary | ICD-10-CM

## 2015-08-01 DIAGNOSIS — R530 Neoplastic (malignant) related fatigue: Secondary | ICD-10-CM

## 2015-08-01 DIAGNOSIS — R2689 Other abnormalities of gait and mobility: Secondary | ICD-10-CM

## 2015-08-01 DIAGNOSIS — R6889 Other general symptoms and signs: Secondary | ICD-10-CM

## 2015-08-01 NOTE — Therapy (Signed)
[place], [place], [address] Phone: [phone]   Fax:  [phone]  Physical Therapy Treatment  Patient Details  Name: [name] MRN: [id number] Date of Birth: [birth year] Referring Provider: Ancil Linsey  Encounter Date: 08/01/2015      PT End of Session - 08/01/15 1639    Visit Number 7   Number of Visits 16   Date for PT Re-Evaluation 08/03/15   Authorization Type united health care   Authorization - Visit Number 7   Authorization - Number of Visits 10   PT Start Time 1601   PT Stop Time 1639   PT Time Calculation (min) 38 min   Equipment Utilized During Treatment Gait belt   Activity Tolerance Patient tolerated treatment well   Behavior During Therapy [facility] for tasks assessed/performed      Past Medical History  Diagnosis Date  . PONV (postoperative nausea and vomiting)     pt states scope patch does well  . Anemia   . Depression   . Anxiety   . Headache(784.0)     has migraines - medication controls  . Family history of prostate cancer   . Hot flashes due to tamoxifen 09/25/2014  . Breast cancer ([place]) 04/2014    Presumed dx; stage 4 w/ mets to bone and liver, brain lesions  . Bony metastasis ([place])   . S/P small bowel resection   . Drug-induced cardiomyopathy ([place]) 02/15/2015    Past Surgical History  Procedure Laterality Date  . Right rotator cuff      2002  . Neck fusion      2003  . Laparoscopic cholecystectomy      2004  . Right knee arthroscopy      2005  . Appendectomy      2008  . Abdominal hysterectomy    . Breast reduction surgery  03/17/2011    Procedure: MAMMARY REDUCTION BILATERAL (BREAST);  Surgeon: Mary A Contogiannis;  Location: [place];  Service: Plastics;  Laterality: Bilateral;  . Colonoscopy N/A 07/13/2013    Procedure: COLONOSCOPY;  Surgeon: Rogene Houston, MD;  [facility];  Service: Endoscopy;  Laterality: N/A;  930  . Liver  biopsy  04/2014  . Portacath placement    . Esophagogastroduodenoscopy N/A 05/25/2014    Procedure: ESOPHAGOGASTRODUODENOSCOPY (EGD);  Surgeon: Rogene Houston, MD;  [facility];  Service: Endoscopy;  Laterality: N/A;  155  . Laparoscopic appendectomy    . Colonoscopy N/A 11/26/2014    Procedure: COLONOSCOPY;  Surgeon: Rogene Houston, MD;  [facility];  Service: Endoscopy;  Laterality: N/A;  730    There were no vitals filed for this visit.  Visit Diagnosis:  Weakness  Neoplastic malignant related fatigue  Abnormality of gait  Stiffness of shoulder joint, left  At risk for falls  Decreased activity tolerance  Unstable balance      Subjective Assessment - 08/01/15 1604    Subjective Pt reports she has been feeling better but very tired, after a busy week. HEP good. Back pain has since resolved.    Pertinent History Pt has recently been dischared from the hospital with pneumonia .Pt fractured her Lt shoulder after radiation and still does not have full use of her arm.  Pt is a 50 y/o female with a diagnosis of Stage IV Breast Cancer, diagnosed in December 2015. She has had three types of radiation which ended in the summer  of 2016 and is currently taking chemo radiation.  Pt presents for cancer rehabilitation evaluation this date due to fatigue and weakness. Dr. Ancil Linsey referred pt to physical  therapy for evaluation and treatment.    How long can you sit comfortably? 2 hours (previously 1 hours)    How long can you stand comfortably? 3/23 30 minutes (2/23 able to stand for 15 minutes)    How long can you walk comfortably? 3/23 2 hours (2/23 able to walk for 15 minutes)    Patient Stated Goals improve fatigue and decrease back pain..  P   Currently in Pain? No/denies                         [facility] Adult PT Treatment/Exercise - 08/01/15 0001    Lumbar Exercises: Quadruped   Opposite Arm/Leg Raise Right arm/Left leg;Left arm/Right  leg;10 reps  2x10   Knee/Hip Exercises: Standing   Functional Squat 2 sets;10 reps  wide feet/wide knees             Balance Exercises - 08/01/15 1620    Balance Exercises: Standing   Standing Eyes Opened Foam/compliant surface;Narrow base of support (BOS);4 reps  trunk rotation c blue ball 4x10    Standing Eyes Closed Narrow base of support (BOS);Solid surface;4 reps;30 secs   Tandem Stance Eyes open;2 reps;30 secs   SLS Eyes open;2 reps  2x10 ball tosses           PT Education - 08/01/15 1638    Education provided Yes   Education Details explained proprioceptive balance techniques.    Person(s) Educated Patient   Methods Explanation;Demonstration   Comprehension Verbalized understanding;Returned demonstration          PT Short Term Goals - 07/18/15 1345    PT SHORT TERM GOAL #1   Title PT will improve left shoulder ROM by 15 degrees to allow ease of donning of clothing    Time 4   Period Weeks   Status On-going   PT SHORT TERM GOAL #2   Title Pt will improve single leg stance to 20 seconds to allow pt to be ablet to ambulate for 1300 ft without lose of balance for improved confidence for community ambulation.    Time 4   Period Weeks   Status On-going   PT SHORT TERM GOAL #3   Title Pt to report back and shoulder pain are no greater than a 1/10 to assist patient with sleep    Time 4   Period Weeks   Status On-going   PT SHORT TERM GOAL #4   Title Pt VAS fatige to be no greater than a 4 to allow pt to complete 1 to 2 hours of housework without having to rest    Time 4   Period Weeks   Status On-going           PT Long Term Goals - 07/18/15 1345    PT LONG TERM GOAL #1   Title Pt Shoulder ROM to be within functional limits to allow pt to lift plates into higher cabinets.    Time 8   Period Weeks   Status On-going   PT LONG TERM GOAL #2   Title Pt core and LE strength to be 5/5 to allow pt to walk on uneven surfaces for 30 minutes for pt to be  able to walk on her property without fear of falling.    Time 8  Period Weeks   Status On-going   PT LONG TERM GOAL #3   Title Pt UE strength to improve to allow pt to be able bring in groceries    Time 8   Period Weeks   Status On-going   PT LONG TERM GOAL #4   Title Pt fatigue to be no greater than a 2 to allow pt to be up and completing functional activites , ( housecleaning, shopping or working out in the barn) for 3 hours without having to rest.    Time 8   Period Weeks   Status On-going   PT LONG TERM GOAL #5   Title Pt to be able to single leg stance for 30 seconds to allow pt to walk for 15 minutes without having to self correct loss of balance and to allow pt to report that she has not had a fall in the past 4 weeks.    Time 8   Period Weeks   Status On-going               Plan - 08/01/15 1644    Clinical Impression Statement Pt making good progress toward goals today. Shifted away from static balance adn more toward dynamic stability activiiteis which are tolerated well when fatigue is not a factor. R ankle fatigue is limiting in SLS and Left glute max fatgiue limits form on sit to stands. Noted DOE throughout session.    Pt will benefit from skilled therapeutic intervention in order to improve on the following deficits Abnormal gait;Decreased activity tolerance;Decreased balance;Decreased range of motion;Decreased strength;Difficulty walking;Pain   Rehab Potential Good   PT Frequency 2x / week   PT Duration 12 weeks   PT Treatment/Interventions ADLs/Self Care Home Management;Cryotherapy;Moist Heat;Therapeutic exercise;Therapeutic activities;Functional mobility training;Stair training;Gait training;Balance training;Manual techniques;Passive range of motion   PT Next Visit Plan Continue to progress dynamic stabilization.    PT Home Exercise Plan no changes   Consulted and Agree with Plan of Care Patient        Problem List Patient Active Problem List    Diagnosis Date Noted  . CAP (community acquired pneumonia)   . Esophageal reflux   . Metastatic breast cancer ([place])   . HCAP (healthcare-associated pneumonia) 06/02/2015  . Pneumonia 06/02/2015  . Drug-induced cardiomyopathy ([place]) 02/15/2015  . Brain metastases ([place]) 01/29/2015  . Hot flashes due to tamoxifen 09/25/2014  . Genetic testing 09/05/2014  . Family history of prostate cancer   . GERD (gastroesophageal reflux disease) 07/16/2014  . DVT (deep venous thrombosis) ([place]) 07/09/2014  . Bone metastases ([place]) 05/23/2014  . Breast cancer, stage 4 ([place]) 05/16/2014  . Hyperlipidemia 04/23/2014  . Depression 04/23/2014   4:47 PM, 08/01/2015 Etta Grandchild, PT, DPT PRN Physical Therapist at [place] # [id number] (wireless)  [phone] (mobile)       [facility] [address] [place], [place], [address] Phone: [phone]   Fax:  [phone]  Name: [name] MRN: [id number] Date of Birth: [birth year]

## 2015-08-04 ENCOUNTER — Other Ambulatory Visit (HOSPITAL_COMMUNITY): Payer: Self-pay | Admitting: Hematology & Oncology

## 2015-08-05 ENCOUNTER — Other Ambulatory Visit (HOSPITAL_COMMUNITY): Payer: Self-pay | Admitting: *Deleted

## 2015-08-05 DIAGNOSIS — C50919 Malignant neoplasm of unspecified site of unspecified female breast: Secondary | ICD-10-CM

## 2015-08-05 DIAGNOSIS — C7951 Secondary malignant neoplasm of bone: Secondary | ICD-10-CM

## 2015-08-05 MED ORDER — MORPHINE SULFATE ER 30 MG PO TBCR: 30.0000 mg | EXTENDED_RELEASE_TABLET | Freq: Two times a day (BID) | ORAL | Status: DC

## 2015-08-06 ENCOUNTER — Ambulatory Visit (HOSPITAL_COMMUNITY): Payer: 59 | Admitting: Physical Therapy

## 2015-08-06 DIAGNOSIS — Z9181 History of falling: Secondary | ICD-10-CM

## 2015-08-06 DIAGNOSIS — R531 Weakness: Secondary | ICD-10-CM | POA: Diagnosis not present

## 2015-08-06 DIAGNOSIS — R269 Unspecified abnormalities of gait and mobility: Secondary | ICD-10-CM

## 2015-08-06 DIAGNOSIS — R6889 Other general symptoms and signs: Secondary | ICD-10-CM

## 2015-08-06 DIAGNOSIS — M25612 Stiffness of left shoulder, not elsewhere classified: Secondary | ICD-10-CM

## 2015-08-06 DIAGNOSIS — R2689 Other abnormalities of gait and mobility: Secondary | ICD-10-CM

## 2015-08-06 NOTE — Therapy (Addendum)
[place], [place], [address] Phone: [phone]   Fax:  [phone]  Physical Therapy Treatment  Patient Details  Name: [name] MRN: [id number] Date of Birth: 01-May-1966 Referring Provider: Ancil Linsey  Encounter Date: 08/06/2015      PT End of Session - 08/06/15 1015    Visit Number 8   Number of Visits 16   Date for PT Re-Evaluation 08/03/15   Authorization Type united health care   Authorization - Visit Number 8   Authorization - Number of Visits 10   PT Start Time [phone]   PT Stop Time 1023   PT Time Calculation (min) 49 min   Equipment Utilized During Treatment Gait belt   Activity Tolerance Patient tolerated treatment well      Past Medical History  Diagnosis Date  . PONV (postoperative nausea and vomiting)     pt states scope patch does well  . Anemia   . Depression   . Anxiety   . Headache(784.0)     has migraines - medication controls  . Family history of prostate cancer   . Hot flashes due to tamoxifen 09/25/2014  . Breast cancer ([place]) 04/2014    Presumed dx; stage 4 w/ mets to bone and liver, brain lesions  . Bony metastasis ([place])   . S/P small bowel resection   . Drug-induced cardiomyopathy ([place]) 02/15/2015    Past Surgical History  Procedure Laterality Date  . Right rotator cuff      2002  . Neck fusion      2003  . Laparoscopic cholecystectomy      2004  . Right knee arthroscopy      2005  . Appendectomy      2008  . Abdominal hysterectomy    . Breast reduction surgery  03/17/2011    Procedure: MAMMARY REDUCTION BILATERAL (BREAST);  Surgeon: Mary A Contogiannis;  Location: [place];  Service: Plastics;  Laterality: Bilateral;  . Colonoscopy N/A 07/13/2013    Procedure: COLONOSCOPY;  Surgeon: Rogene Houston, MD;  [facility];  Service: Endoscopy;  Laterality: N/A;  930  . Liver biopsy  04/2014  . Portacath placement    .  Esophagogastroduodenoscopy N/A 05/25/2014    Procedure: ESOPHAGOGASTRODUODENOSCOPY (EGD);  Surgeon: Rogene Houston, MD;  [facility];  Service: Endoscopy;  Laterality: N/A;  155  . Laparoscopic appendectomy    . Colonoscopy N/A 11/26/2014    Procedure: COLONOSCOPY;  Surgeon: Rogene Houston, MD;  [facility];  Service: Endoscopy;  Laterality: N/A;  730    There were no vitals filed for this visit.  Visit Diagnosis:  Weakness  Abnormality of gait  Stiffness of shoulder joint, left  At risk for falls  Decreased activity tolerance  Unstable balance      Subjective Assessment - 08/06/15 0937    Subjective Pt is nauseated today but otherwise is doing well      Currently in Pain? Yes   Pain Score 5    Pain Location Shoulder   Pain Orientation Left   Pain Descriptors / Indicators Aching   Pain Type Chronic pain   Pain Onset 1 to 4 weeks ago   Pain Frequency Constant   Aggravating Factors  reaching    Pain Relieving Factors ice, heat.                [facility] Adult PT Treatment/Exercise - 08/06/15 0001  Balance Poses: Yoga   Warrior I 2 reps;30 seconds   Warrior II 2 reps;30 seconds;60 seconds   Tree Pose --   Lumbar Exercises: Aerobic   Stationary Bike nustep level 3 hills x 10:00   Shoulder Exercises: Pulleys   Flexion 2 minutes   ABduction 2 minutes             Balance Exercises - 08/06/15 1013    Balance Exercises: Standing   SLS with Vectors Solid surface;2 reps;10 secs  both LE   Tandem Gait Forward;Foam/compliant surface;2 reps  with hurdles    Sidestepping Foam/compliant support;2 reps  with hurdles            PT Education - 08/06/15 1014    Education provided Yes   Education Details friction massage to rotator cuff mm with arm externally rotated; Ice massage to tendon with arm externally roatated    Person(s) Educated Patient   Methods Explanation;Demonstration;Verbal cues   Comprehension Verbalized  understanding;Returned demonstration          PT Short Term Goals - 07/18/15 1345    PT SHORT TERM GOAL #1   Title PT will improve left shoulder ROM by 15 degrees to allow ease of donning of clothing    Time 4   Period Weeks   Status On-going   PT SHORT TERM GOAL #2   Title Pt will improve single leg stance to 20 seconds to allow pt to be ablet to ambulate for 1300 ft without lose of balance for improved confidence for community ambulation.    Time 4   Period Weeks   Status On-going   PT SHORT TERM GOAL #3   Title Pt to report back and shoulder pain are no greater than a 1/10 to assist patient with sleep    Time 4   Period Weeks   Status On-going   PT SHORT TERM GOAL #4   Title Pt VAS fatige to be no greater than a 4 to allow pt to complete 1 to 2 hours of housework without having to rest    Time 4   Period Weeks   Status On-going           PT Long Term Goals - 07/18/15 1345    PT LONG TERM GOAL #1   Title Pt Shoulder ROM to be within functional limits to allow pt to lift plates into higher cabinets.    Time 8   Period Weeks   Status On-going   PT LONG TERM GOAL #2   Title Pt core and LE strength to be 5/5 to allow pt to walk on uneven surfaces for 30 minutes for pt to be able to walk on her property without fear of falling.    Time 8   Period Weeks   Status On-going   PT LONG TERM GOAL #3   Title Pt UE strength to improve to allow pt to be able bring in groceries    Time 8   Period Weeks   Status On-going   PT LONG TERM GOAL #4   Title Pt fatigue to be no greater than a 2 to allow pt to be up and completing functional activites , ( housecleaning, shopping or working out in the barn) for 3 hours without having to rest.    Time 8   Period Weeks   Status On-going   PT LONG TERM GOAL #5   Title Pt to be able to single leg stance for 30 seconds to  allow pt to walk for 15 minutes without having to self correct loss of balance and to allow pt to report that she has  not had a fall in the past 4 weeks.    Time 8   Period Weeks   Status On-going               Problem List Patient Active Problem List   Diagnosis Date Noted  . CAP (community acquired pneumonia)   . Esophageal reflux   . Metastatic breast cancer ([place])   . HCAP (healthcare-associated pneumonia) 06/02/2015  . Pneumonia 06/02/2015  . Drug-induced cardiomyopathy ([place]) 02/15/2015  . Brain metastases ([place]) 01/29/2015  . Hot flashes due to tamoxifen 09/25/2014  . Genetic testing 09/05/2014  . Family history of prostate cancer   . GERD (gastroesophageal reflux disease) 07/16/2014  . DVT (deep venous thrombosis) ([place]) 07/09/2014  . Bone metastases ([place]) 05/23/2014  . Breast cancer, stage 4 ([place]) 05/16/2014  . Hyperlipidemia 04/23/2014  . Depression 04/23/2014    Rayetta Humphrey, PT CLT [phone] 08/06/2015, 10:19 AM  [facility] [address] [place], [place], [address] Phone: [phone]   Fax:  [phone]  Name: [name] MRN: [id number] Date of Birth: [birth year]    PHYSICAL THERAPY DISCHARGE SUMMARY  Visits from Start of Care: 8  Current functional level related to goals / functional outcomes: Functional  ROM   Remaining deficits: Strength,   Education / Equipment: HEP  Plan: Patient agrees to discharge.  Patient goals were partially met. Patient is being discharged due to a change in medical status.  ?????       Rayetta Humphrey, [place] CLT [phone]

## 2015-08-07 ENCOUNTER — Telehealth (HOSPITAL_COMMUNITY): Payer: Self-pay | Admitting: Oncology

## 2015-08-07 ENCOUNTER — Encounter (HOSPITAL_COMMUNITY): Payer: 59

## 2015-08-07 ENCOUNTER — Telehealth (HOSPITAL_COMMUNITY): Payer: Self-pay | Admitting: Physical Therapy

## 2015-08-07 ENCOUNTER — Other Ambulatory Visit (HOSPITAL_COMMUNITY): Payer: Self-pay | Admitting: Hematology & Oncology

## 2015-08-07 ENCOUNTER — Ambulatory Visit (HOSPITAL_COMMUNITY)
Admission: RE | Admit: 2015-08-07 | Discharge: 2015-08-07 | Disposition: A | Discharge status: Home / Self Care | Payer: 59 | Source: Ambulatory Visit | Attending: Hematology & Oncology | Admitting: Hematology & Oncology

## 2015-08-07 DIAGNOSIS — M67814 Other specified disorders of tendon, left shoulder: Secondary | ICD-10-CM | POA: Diagnosis not present

## 2015-08-07 DIAGNOSIS — M25512 Pain in left shoulder: Secondary | ICD-10-CM | POA: Diagnosis not present

## 2015-08-07 DIAGNOSIS — M75102 Unspecified rotator cuff tear or rupture of left shoulder, not specified as traumatic: Secondary | ICD-10-CM | POA: Diagnosis not present

## 2015-08-07 DIAGNOSIS — M84412K Pathological fracture, left shoulder, subsequent encounter for fracture with nonunion: Secondary | ICD-10-CM | POA: Diagnosis not present

## 2015-08-07 DIAGNOSIS — G8929 Other chronic pain: Secondary | ICD-10-CM

## 2015-08-07 NOTE — Telephone Encounter (Signed)
Shane from MRI called.  Lowen is present for an MRI of shoulder w and wo contrast.  Her imaging order has been reviewed with radiology.  They have recommended an MRI wo contrast for rotator cuff evaluation or w contrast for bone evaluation.  With and without contrast is not recommended.  I have recommended to [facility] to do it without contrast given symptoms and recent PET that did not reveal any bone mets in that area.  This will also reduce her contrast exposure given her history of significant contrast exposure.  Discussed with Angie and she confirms that change is ok from an insurance perspective.  Yamilka Lopiccolo, PA-C 08/07/2015 10:56 AM

## 2015-08-07 NOTE — Telephone Encounter (Signed)
She has Left torn rotator cuff will have surgery in 2 weeks, will talk with MD tomorrow and get advice to continue PT or stop for now. Nf

## 2015-08-08 ENCOUNTER — Ambulatory Visit (HOSPITAL_COMMUNITY): Payer: 59 | Admitting: Physical Therapy

## 2015-08-08 ENCOUNTER — Encounter (HOSPITAL_COMMUNITY): Payer: 59

## 2015-08-08 ENCOUNTER — Encounter (HOSPITAL_COMMUNITY)
Admission: RE | Admit: 2015-08-08 | Discharge: 2015-08-08 | Disposition: A | Discharge status: Home / Self Care | Payer: 59 | Source: Ambulatory Visit | Attending: Hematology & Oncology | Admitting: Hematology & Oncology

## 2015-08-08 ENCOUNTER — Encounter (HOSPITAL_BASED_OUTPATIENT_CLINIC_OR_DEPARTMENT_OTHER): Payer: 59

## 2015-08-08 ENCOUNTER — Encounter (HOSPITAL_COMMUNITY): Payer: Self-pay

## 2015-08-08 VITALS — BP 119/78 | HR 83 | Temp 97.6°F | Resp 16

## 2015-08-08 DIAGNOSIS — C787 Secondary malignant neoplasm of liver and intrahepatic bile duct: Secondary | ICD-10-CM | POA: Diagnosis not present

## 2015-08-08 DIAGNOSIS — C7951 Secondary malignant neoplasm of bone: Secondary | ICD-10-CM

## 2015-08-08 DIAGNOSIS — C50919 Malignant neoplasm of unspecified site of unspecified female breast: Secondary | ICD-10-CM

## 2015-08-08 DIAGNOSIS — Z79899 Other long term (current) drug therapy: Secondary | ICD-10-CM | POA: Diagnosis not present

## 2015-08-08 DIAGNOSIS — Z9221 Personal history of antineoplastic chemotherapy: Secondary | ICD-10-CM | POA: Insufficient documentation

## 2015-08-08 DIAGNOSIS — C229 Malignant neoplasm of liver, not specified as primary or secondary: Secondary | ICD-10-CM | POA: Diagnosis not present

## 2015-08-08 DIAGNOSIS — C7931 Secondary malignant neoplasm of brain: Secondary | ICD-10-CM

## 2015-08-08 LAB — CBC WITH DIFFERENTIAL/PLATELET
Basophils Absolute: 0 10*3/uL (ref 0.0–0.1)
Basophils Relative: 1 %
EOS PCT: 2 %
Eosinophils Absolute: 0.1 10*3/uL (ref 0.0–0.7)
HEMATOCRIT: 37.4 % (ref 36.0–46.0)
Hemoglobin: 12.4 g/dL (ref 12.0–15.0)
LYMPHS ABS: 0.7 10*3/uL (ref 0.7–4.0)
LYMPHS PCT: 17 %
MCH: 29.9 pg (ref 26.0–34.0)
MCHC: 33.2 g/dL (ref 30.0–36.0)
MCV: 90.1 fL (ref 78.0–100.0)
MONO ABS: 0.4 10*3/uL (ref 0.1–1.0)
MONOS PCT: 9 %
Neutro Abs: 3.1 10*3/uL (ref 1.7–7.7)
Neutrophils Relative %: 71 %
PLATELETS: 200 10*3/uL (ref 150–400)
RBC: 4.15 MIL/uL (ref 3.87–5.11)
RDW: 14.1 % (ref 11.5–15.5)
WBC: 4.3 10*3/uL (ref 4.0–10.5)

## 2015-08-08 LAB — COMPREHENSIVE METABOLIC PANEL
ALK PHOS: 43 U/L (ref 38–126)
ALT: 17 U/L (ref 14–54)
ANION GAP: 7 (ref 5–15)
AST: 21 U/L (ref 15–41)
Albumin: 3.7 g/dL (ref 3.5–5.0)
BUN: 11 mg/dL (ref 6–20)
CALCIUM: 8.9 mg/dL (ref 8.9–10.3)
CO2: 29 mmol/L (ref 22–32)
CREATININE: 0.71 mg/dL (ref 0.44–1.00)
Chloride: 104 mmol/L (ref 101–111)
Glucose, Bld: 117 mg/dL — ABNORMAL HIGH (ref 65–99)
Potassium: 3.9 mmol/L (ref 3.5–5.1)
Sodium: 140 mmol/L (ref 135–145)
Total Bilirubin: 0.4 mg/dL (ref 0.3–1.2)
Total Protein: 6.6 g/dL (ref 6.5–8.1)

## 2015-08-08 MED ORDER — DENOSUMAB 120 MG/1.7ML ~~LOC~~ SOLN
120.0000 mg | Freq: Once | SUBCUTANEOUS | Status: AC
  Administered 2015-08-08: 120 mg via SUBCUTANEOUS
  Filled 2015-08-08: qty 1.7

## 2015-08-08 MED ORDER — SODIUM CHLORIDE 0.9% FLUSH
INTRAVENOUS | Status: AC
  Filled 2015-08-08: qty 120

## 2015-08-08 MED ORDER — TECHNETIUM TC 99M-LABELED RED BLOOD CELLS IV KIT
25.0000 | PACK | Freq: Once | INTRAVENOUS | Status: AC | PRN
  Administered 2015-08-08: 27 via INTRAVENOUS

## 2015-08-08 MED ORDER — HEPARIN SOD (PORK) LOCK FLUSH 100 UNIT/ML IV SOLN
INTRAVENOUS | Status: AC
  Filled 2015-08-08: qty 5

## 2015-08-08 NOTE — Progress Notes (Signed)
[name] presents today for injection per MD orders. Xgeva administered SQ in right Abdomen. Administration without incident. Patient tolerated well.

## 2015-08-08 NOTE — Patient Instructions (Signed)
[facility] at [facility] Discharge Instructions  RECOMMENDATIONS MADE BY THE CONSULTANT AND ANY TEST RESULTS WILL BE SENT TO YOUR REFERRING PHYSICIAN.  Xgeva today.    Thank you for choosing [facility] at [facility] to provide your oncology and hematology care.  To afford each patient quality time with our provider, please arrive at least 15 minutes before your scheduled appointment time.   Beginning January 23rd 2017 lab work for the Cancer Center will be done in the  Main lab at [facility] on 1st floor. If you have a lab appointment with the Cancer Center please come in thru the  Main Entrance and check in at the main information desk  You need to re-schedule your appointment should you arrive 10 or more minutes late.  We strive to give you quality time with our providers, and arriving late affects you and other patients whose appointments are after yours.  Also, if you no show three or more times for appointments you may be dismissed from the clinic at the providers discretion.     Again, thank you for choosing [facility].  Our hope is that these requests will decrease the amount of time that you wait before being seen by our physicians.       _____________________________________________________________  Should you have questions after your visit to [facility], please contact our office at [phone] between the hours of 8:30 a.m. and 4:30 p.m.  Voicemails left after 4:30 p.m. will not be returned until the following business day.  For prescription refill requests, have your pharmacy contact our office.         Resources For Cancer Patients and their Caregivers ? American Cancer Society: Can assist with transportation, wigs, general needs, runs Look Good Feel Better.        1-888-227-6333 ? Cancer Care: Provides financial assistance, online support groups, medication/co-pay assistance.  1-800-813-HOPE  (4673) ? [facility] Assists [place] cancer patients and their families through emotional , educational and financial support.  336-427-4357 ? [place] DSS Where to apply for food stamps, Medicaid and utility assistance. 336-342-1394 ? RCATS: Transportation to medical appointments. 336-347-2287 ? Social Security Administration: May apply for disability if have a Stage IV cancer. 336-342-7796 1-800-772-1213 ? [place] Aging, Disability and Transit Services: Assists with nutrition, care and transit needs. 336-349-2343  

## 2015-08-09 ENCOUNTER — Telehealth (HOSPITAL_COMMUNITY): Payer: Self-pay | Admitting: Physical Therapy

## 2015-08-09 ENCOUNTER — Encounter (HOSPITAL_COMMUNITY): Payer: Self-pay

## 2015-08-09 NOTE — Telephone Encounter (Signed)
Rotator tear surgery coming up, an some other issues Dr/Penn[place] advised to stop PT for now. NF

## 2015-08-13 ENCOUNTER — Ambulatory Visit (HOSPITAL_COMMUNITY): Payer: 59 | Admitting: Physical Therapy

## 2015-08-15 ENCOUNTER — Ambulatory Visit (HOSPITAL_COMMUNITY): Payer: 59 | Admitting: Physical Therapy

## 2015-08-15 ENCOUNTER — Ambulatory Visit (HOSPITAL_COMMUNITY): Payer: 59 | Admitting: Hematology & Oncology

## 2015-08-15 ENCOUNTER — Inpatient Hospital Stay (HOSPITAL_COMMUNITY): Payer: 59

## 2015-08-16 ENCOUNTER — Encounter (HOSPITAL_COMMUNITY): Payer: Self-pay | Admitting: Hematology & Oncology

## 2015-08-16 ENCOUNTER — Encounter (HOSPITAL_COMMUNITY): Payer: 59 | Attending: Hematology & Oncology

## 2015-08-16 ENCOUNTER — Encounter (HOSPITAL_BASED_OUTPATIENT_CLINIC_OR_DEPARTMENT_OTHER): Payer: 59 | Admitting: Hematology & Oncology

## 2015-08-16 ENCOUNTER — Encounter (HOSPITAL_BASED_OUTPATIENT_CLINIC_OR_DEPARTMENT_OTHER): Payer: 59

## 2015-08-16 VITALS — BP 120/80 | HR 80 | Temp 98.0°F | Resp 18

## 2015-08-16 VITALS — BP 119/82 | HR 93 | Temp 98.0°F | Resp 18 | Wt 247.0 lb

## 2015-08-16 DIAGNOSIS — C7931 Secondary malignant neoplasm of brain: Secondary | ICD-10-CM

## 2015-08-16 DIAGNOSIS — C50919 Malignant neoplasm of unspecified site of unspecified female breast: Secondary | ICD-10-CM

## 2015-08-16 DIAGNOSIS — C787 Secondary malignant neoplasm of liver and intrahepatic bile duct: Secondary | ICD-10-CM | POA: Diagnosis not present

## 2015-08-16 DIAGNOSIS — C7951 Secondary malignant neoplasm of bone: Secondary | ICD-10-CM

## 2015-08-16 DIAGNOSIS — I427 Cardiomyopathy due to drug and external agent: Secondary | ICD-10-CM

## 2015-08-16 DIAGNOSIS — Z5112 Encounter for antineoplastic immunotherapy: Secondary | ICD-10-CM

## 2015-08-16 DIAGNOSIS — Z5111 Encounter for antineoplastic chemotherapy: Secondary | ICD-10-CM

## 2015-08-16 DIAGNOSIS — Z79899 Other long term (current) drug therapy: Secondary | ICD-10-CM

## 2015-08-16 DIAGNOSIS — C229 Malignant neoplasm of liver, not specified as primary or secondary: Secondary | ICD-10-CM | POA: Insufficient documentation

## 2015-08-16 MED ORDER — DIPHENHYDRAMINE HCL 25 MG PO CAPS
ORAL_CAPSULE | ORAL | Status: AC
  Filled 2015-08-16: qty 2

## 2015-08-16 MED ORDER — GOSERELIN ACETATE 3.6 MG ~~LOC~~ IMPL
3.6000 mg | DRUG_IMPLANT | Freq: Once | SUBCUTANEOUS | Status: AC
Start: 2015-08-16 — End: 2015-08-16
  Administered 2015-08-16: 3.6 mg via SUBCUTANEOUS
  Filled 2015-08-16: qty 3.6

## 2015-08-16 MED ORDER — DIPHENHYDRAMINE HCL 25 MG PO CAPS
50.0000 mg | ORAL_CAPSULE | Freq: Once | ORAL | Status: AC
  Administered 2015-08-16: 50 mg via ORAL

## 2015-08-16 MED ORDER — SODIUM CHLORIDE 0.9 % IV SOLN
Freq: Once | INTRAVENOUS | Status: AC
  Administered 2015-08-16: 11:00:00 via INTRAVENOUS

## 2015-08-16 MED ORDER — ACETAMINOPHEN 325 MG PO TABS
ORAL_TABLET | ORAL | Status: AC
  Filled 2015-08-16: qty 2

## 2015-08-16 MED ORDER — HEPARIN SOD (PORK) LOCK FLUSH 100 UNIT/ML IV SOLN
500.0000 [IU] | Freq: Once | INTRAVENOUS | Status: AC
  Administered 2015-08-16: 500 [IU] via INTRAVENOUS

## 2015-08-16 MED ORDER — ACETAMINOPHEN 325 MG PO TABS
650.0000 mg | ORAL_TABLET | Freq: Once | ORAL | Status: AC
  Administered 2015-08-16: 650 mg via ORAL

## 2015-08-16 MED ORDER — TRASTUZUMAB CHEMO INJECTION 440 MG
6.0000 mg/kg | Freq: Once | INTRAVENOUS | Status: AC
  Administered 2015-08-16: 672 mg via INTRAVENOUS
  Filled 2015-08-16: qty 32

## 2015-08-16 MED ORDER — HEPARIN SOD (PORK) LOCK FLUSH 100 UNIT/ML IV SOLN: 500.0000 [IU] | Freq: Once | INTRAVENOUS | Status: AC | PRN

## 2015-08-16 MED ORDER — SODIUM CHLORIDE 0.9 % IJ SOLN
10.0000 mL | INTRAMUSCULAR | Status: DC | PRN
  Administered 2015-08-16: 10 mL
  Filled 2015-08-16: qty 10

## 2015-08-16 NOTE — Patient Instructions (Addendum)
[place] at [facility] Discharge Instructions  RECOMMENDATIONS MADE BY THE CONSULTANT AND ANY TEST RESULTS WILL BE SENT TO YOUR REFERRING PHYSICIAN.  Exam done and seen today by Dr. Whitney Muse Talked about your shoulder getting better. Will repeat your [place] scan in 8 weeks.  Continue Vitamin D  After surgery we will stop injection because your estrogen will continue to drop. Treatment and injection today-xoladex Return to see the doctor in 3 weeks  Please call the clinic if you have any questions or concerns  Thank you for choosing [facility] to provide your oncology and hematology care.  To afford each patient quality time with our provider, please arrive at least 15 minutes before your scheduled appointment time.   Beginning January 23rd 2017 lab work for the [facility] will be done in the  Main lab at [facility] on 1st floor. If you have a lab appointment with the [facility] please come in thru the  Main Entrance and check in at the main information desk  You need to re-schedule your appointment should you arrive 10 or more minutes late.  We strive to give you quality time with our providers, and arriving late affects you and other patients whose appointments are after yours.  Also, if you no show three or more times for appointments you may be dismissed from the clinic at the providers discretion.     Again, thank you for choosing [facility].  Our hope is that these requests will decrease the amount of time that you wait before being seen by our physicians.       _____________________________________________________________  Should you have questions after your visit to [facility], please contact our office at [phone] between the hours of 8:30 a.m. and 4:30 p.m.  Voicemails left after 4:30 p.m. will not be returned until the following business day.  For prescription refill requests, have  your pharmacy contact our office.         Resources For Cancer Patients and their Caregivers ? American Cancer Society: Can assist with transportation, wigs, general needs, runs Look Good Feel Better.        276-850-3036 ? Cancer Care: Provides financial assistance, online support groups, medication/co-pay assistance.  1-800-813-HOPE 249-527-9521) ? [facility] Assists [place] cancer patients and their families through emotional , educational and financial support.  9560491481 ? [place] DSS Where to apply for food stamps, Medicaid and utility assistance. (743)813-8699 ? RCATS: Transportation to medical appointments. 612 657 6843 ? Social Security Administration: May apply for disability if have a Stage IV cancer. 530-317-7053 (579)772-1308 ? [employer], Disability and Transit Services: Assists with nutrition, care and transit needs. (719)852-4669

## 2015-08-16 NOTE — Progress Notes (Signed)
[facility] [place], MD [place] [address]    Breast cancer, stage 4 [facility])   04/25/2014 Initial Diagnosis Breast cancer, stage 4   04/25/2014 Imaging CT abdomen/pelvia with widespread metastatic disease to the liver, multiple lytic lesions throughout spine and pelvis. No FX or epidural tumor identified   04/26/2014 Imaging CT head unremarkable   04/26/2014 Imaging CT chest with no lung mass or pulmonary nodules, no adenopathy. Lytic bone lesions, right 2nd rib   04/27/2014 Initial Biopsy U/S guided liver biopsy, lesion in anterior and inferior left hepatic lobe biopsied   04/27/2014 Pathology Results Metastatic adenocarcinoma, CK7, ER+, patchy positivity with PR. Possible primary includes breast, less likely gynecologic   05/15/2014 Mammogram BI-RADS CATEGORY  2: Benign Finding(s)   05/16/2014 PET scan 1. Intensely hypermetabolic hepatic metastasis. 2. Widespread hypermetabolic skeletal lesions. 3. No primary adenocarcinoma identified by FDG PET imaging.   05/19/2014 Imaging MUGA- Left ventricular ejection fracture greater than 70%.   05/21/2014 Breast MRI No suspicious masses or enhancement within the breasts. No axillary adenopathy.   05/22/2014 - 07/03/2014 Antibody Plan Herceptin/Perjeta/Tamoxifen   06/12/2014 - 07/03/2014 Chemotherapy Taxotere added secondary to persistent abdominal and back pain   06/17/2014 - 06/19/2014 Hospital Admission Neutropenia, fever, diarrhea, nausea, vomiting   06/20/2014 - 07/10/2014 Radiation Therapy Dr. Thea Silversmith 12 fractions to L3-S3 (30 Gy) and left scapula (20 Gy).    07/03/2014 Adverse Reaction Perjeta- induced diarrhea.  Perjeta discontinued   07/16/2014 - 07/20/2014 Hospital Admission Electrolyte abnormalities, and diarrhea.  Suspect Perjeta-induced diarrhea.  Negative GI work-up.   07/24/2014 -  Chemotherapy Herceptin/Tamoxifen/Xgeva   08/21/2014 Imaging MUGA- Left ventricular ejection fraction  equals 71%.   08/24/2014 PET scan Dramatic reduction in metabolic activity of the widespread liver metastasis. Liver metastasis now have metabolic activity equal to background normal liver activity. Liver has a nodular contour. Marked reduction in metabolic activity of skeletal lesions..   10/05/2014 Progression Widespread metastatic disease to the brain as described. Between 20 and 30 intracranial metastatic deposits are now seen. No midline shift or incipient herniation   10/09/2014 - 10/26/2014 Radiation Therapy Whole Brain XRT   11/14/2014 Imaging MUGA- LVEF 67%   02/13/2015 Imaging MUGA- LVEF 59%   02/15/2015 Treatment Plan Change Due to declining LVEF, will hold Herceptin per PI guidelines.   04/12/2015 -  Chemotherapy Herceptin restarted   06/02/2015 - 06/08/2015 Hospital Admission Pneumonia   07/05/2015 Progression  PET/CT concern for mild progression of skeletal metastasis with several lesions within the spine and 1 lesion in the Left iliac wing with mild to moderate metabolic activity new from prior. Rising CA 27-29   07/16/2015 Imaging MRI brain with satisfactory post treatment apperance of brain. interval resolved enhancing R caudate metastasis, minimal punctate residual enhancing metastatic disease at the inferior L cerebellum. No new metastatic disease or new intracranial abnormality   07/19/2015 Treatment Plan Change Discontinue Tamoxifen, Zoladex plus Arimidex.    Stage IV adenocarcinoma with metastases to liver and bone  Had last mammogram in [place] this year, Dr. Gaetano Net  Colonoscopy 05/18/2013  Breast Reduction 03/17/2011  CURRENT THERAPY: Herceptin/Arimidex/Zoladex/XGEVA  INTERVAL HISTORY: [name] 50 y.o. female returns for follow-up of stage IV adenocarcinoma of the breast, ER+, HER 2 + disease. [name] was here with husband today. She is here for ongoing herceptin therapy.  [name] returns to the [facility] today accompanied by her husband.  She saw orthopedics  in [place] for her shoulder.She says that her shoulder is better thanks to a cortisone injection in the top. She was told that the top of her shoulder has bone spurs and the cortisone shot helped almost immediately. She hasn't heard anything about therapy for it yet.  She received an ultrasound Monday and found one of her ovaries, but not the other. She says "he said it's there, just hiding somewhere." She is having it taken out a week from Monday. She notes that she's really been having cramps lately and was advised that this can be a normal side-effect. She says the hot flashes are worse.  She is flying to [place] for a long weekend to see family, but will not interrupt her Herceptin. She has not moved her treatment.  She has noticed some joint stiffness since starting arimidex. It is not limiting.  She notes a little bit of swelling in her legs only if she has been standing too long or sitting out in the sun. She confirms that her bowels are okay.  In terms of memory, she says it's "stable." If she forgets something, she says "don't say it" and thinks it through until she gets it. She comments that this is not a change.  Today, she will be receiving Herceptin and Zoladex today and continues on her Arimidex.   MEDICAL HISTORY: Past Medical History  Diagnosis Date  . PONV (postoperative nausea and vomiting)     pt states scope patch does well  . Anemia   . Depression   . Anxiety   . Headache(784.0)     has migraines - medication controls  . Family history of prostate cancer   . Hot flashes due to tamoxifen 09/25/2014  . Breast cancer ([place]) 04/2014    Presumed dx; stage 4 w/ mets to bone and liver, brain lesions  . Bony metastasis ([place])   . S/P small bowel resection   . Drug-induced cardiomyopathy ([place]) 02/15/2015    has Hyperlipidemia; Depression; Breast cancer, stage 4 ([place]); Bone metastases ([place]); DVT (deep venous thrombosis) ([place]); GERD (gastroesophageal reflux disease); Family history  of prostate cancer; Genetic testing; Hot flashes due to tamoxifen; Brain metastases ([place]); Drug-induced cardiomyopathy ([place]); HCAP (healthcare-associated pneumonia); Pneumonia; Metastatic breast cancer ([place]); CAP (community acquired pneumonia); and Esophageal reflux on her problem list.      has No Known Allergies.  Ms. Sensabaugh had no medications administered during this visit.  SURGICAL HISTORY: Past Surgical History  Procedure Laterality Date  . Right rotator cuff      2002  . Neck fusion      2003  . Laparoscopic cholecystectomy      2004  . Right knee arthroscopy      2005  . Appendectomy      2008  . Abdominal hysterectomy    . Breast reduction surgery  03/17/2011    Procedure: MAMMARY REDUCTION BILATERAL (BREAST);  Surgeon: Mary A Contogiannis;  Location: [facility];  Service: Plastics;  Laterality: Bilateral;  . Colonoscopy N/A 07/13/2013    Procedure: COLONOSCOPY;  Surgeon: Rogene Houston, MD;  [facility];  Service: Endoscopy;  Laterality: N/A;  930  . Liver biopsy  04/2014  . Portacath placement    . Esophagogastroduodenoscopy N/A 05/25/2014    Procedure: ESOPHAGOGASTRODUODENOSCOPY (EGD);  Surgeon: Rogene Houston, MD;  [facility];  Service: Endoscopy;  Laterality: N/A;  155  . Laparoscopic appendectomy    . Colonoscopy N/A 11/26/2014    Procedure:  COLONOSCOPY;  Surgeon: Rogene Houston, MD;  [facility];  Service: Endoscopy;  Laterality: N/A;  730    SOCIAL HISTORY: Social History   Social History  . Marital Status: Married    Spouse Name: N/A  . Number of Children: N/A  . Years of Education: N/A   Occupational History  . Not on file.   Social History Main Topics  . Smoking status: Former Smoker    Types: Cigarettes    Quit date: 08/20/1994  . Smokeless tobacco: Never Used  . Alcohol Use: Yes     Comment: Occasionally  . Drug Use: No  . Sexual Activity: Yes    Birth Control/ Protection: Surgical   Other  Topics Concern  . Not on file   Social History Narrative    FAMILY HISTORY: Family History  Problem Relation Age of Onset  . Diabetes Father   . Heart attack Maternal Grandmother 30    multiple over lifetime.  . Cancer Maternal Grandmother 78    NOS  . Prostate cancer Maternal Grandfather     dx in his 47s  . Lung cancer Paternal Grandfather     dx <50  . Lymphoma Maternal Aunt     dx in her 23s  . Melanoma Cousin 52    maternal first cousin  . Brain cancer Cousin     paternal first cousin dx under 29  . Prostate cancer Other     MGF's father  . Colon cancer Other     MGM's mother  Her son lives in [place]. Her daughter lives in [place].  Review of Systems  Constitutional: Positive for hot flashes. Negative for fever, chills, weight loss.  HENT: . Negative for congestion, hearing loss, sore throat and tinnitus.   Eyes: Negative for double vision, pain and discharge.  Respiratory:  Negative for cough, hemoptysis, sputum production, shortness of breath and wheezing.   Cardiovascular: Negative for chest pain, palpitations, claudication, and PND.  Gastrointestinal: Positive for abdominal pain Negative for heartburn, nausea, diarrhea, constipation, blood in stool and melena, vomiting.  Genitourinary:Negative for dysuria, urgency, frequency and hematuria.  Musculoskeletal: Positive for joint pain, shoulder pain, and back pain. Negative for myalgias and falls.  Left shoulder pain Skin: Negative for itching and rash.  Neurological: . Negative for tingling, tremors, speech change, focal weakness, seizures, loss of consciousness, weakness. Headaches or blurry vision Endo/Heme/Allergies: Does not bruise/bleed easily.  Psychiatric/Behavioral: Positive for short term memory loss. Negative for suicidal ideas, and substance abuse, nervous/axiety, depression. Memory loss attributed to whole brain radiation.  14 point review of systems was performed and is negative except as detailed  under history of present illness and above   PHYSICAL EXAMINATION  ECOG PERFORMANCE STATUS: 1 - Symptomatic but completely ambulatory Vitals with BMI 07/25/2015  Height   Weight 250 lbs 5 oz  BMI   Systolic 998  Diastolic 72  Pulse 82  Respirations 18     Physical Exam  Constitutional: She is oriented to person, place, and time and well-developed, well-nourished, and in no distress. Facial Cushingoid features (Improved).  Mood is good. Her cheeks appear thinner.  HENT:  Head: Normocephalic and atraumatic. Hair regrowth. Nose: Nose normal.  Mouth/Throat:  Negative Eyes: Conjunctivae and EOM are normal. Pupils are equal, round, and reactive to light. Right eye exhibits no discharge. Left eye exhibits no discharge. No scleral icterus.  Neck: Normal range of motion. No tracheal deviation present. No thyromegaly present.  Cardiovascular: Normal rate, regular  rhythm and normal heart sounds.  Exam reveals no gallop and no friction rub.   No murmur heard. Pulmonary/Chest: Effort normal and breath sounds normal. She has no wheezes. She has no rales.  Abdominal: Soft. Bowel sounds are normal. She exhibits no distension and no mass. There is no tenderness. There is no rebound and no guarding.  Musculoskeletal: Normal range of motion.  Lymphadenopathy:    She has no cervical adenopathy.  Neurological: She is alert and oriented to person, place, and time. She has normal reflexes. No cranial nerve deficit. Gait normal. Coordination normal.  Skin: Skin is warm and dry. No rash noted.  Psychiatric: Mood, memory, affect and judgment normal.  Nursing note and vitals reviewed.  LABORATORY DATA: I have reviewed the data as listed.  CBC    Component Value Date/Time   WBC 4.3 08/08/2015 1135   RBC 4.15 08/08/2015 1135   HGB 12.4 08/08/2015 1135   HCT 37.4 08/08/2015 1135   PLT 200 08/08/2015 1135   MCV 90.1 08/08/2015 1135   MCH 29.9 08/08/2015 1135   MCHC 33.2 08/08/2015 1135   RDW 14.1  08/08/2015 1135   LYMPHSABS 0.7 08/08/2015 1135   MONOABS 0.4 08/08/2015 1135   EOSABS 0.1 08/08/2015 1135   BASOSABS 0.0 08/08/2015 1135   CMP     Component Value Date/Time   NA 140 08/08/2015 1135   K 3.9 08/08/2015 1135   CL 104 08/08/2015 1135   CO2 29 08/08/2015 1135   GLUCOSE 117* 08/08/2015 1135   BUN 11 08/08/2015 1135   CREATININE 0.71 08/08/2015 1135   CALCIUM 8.9 08/08/2015 1135   PROT 6.6 08/08/2015 1135   ALBUMIN 3.7 08/08/2015 1135   AST 21 08/08/2015 1135   ALT 17 08/08/2015 1135   ALKPHOS 43 08/08/2015 1135   BILITOT 0.4 08/08/2015 1135   GFRNONAA >60 08/08/2015 1135   GFRAA >60 08/08/2015 1135   Results for KITTY, CADAVID (MRN [id number]) as of 07/26/2015 21:01  Ref. Range 02/15/2015 11:03 04/12/2015 10:39 05/03/2015 11:44 06/13/2015 10:40 07/25/2015 09:15  CA 27.29 Latest Ref Range: 0.0-38.6 U/mL 27.8 27.0 22.8 42.0 (H) 21.9    RADIOLOGY: I have personally reviewed the radiological images as listed and agreed with the findings in the report.  CLINICAL DATA:  Subsequent treatment strategy for breast cancer with bone metastasis.   EXAM: NUCLEAR MEDICINE PET SKULL BASE TO THIGH   TECHNIQUE: 12.5 mCi F-18 FDG was injected intravenously. Full-ring PET imaging was performed from the skull base to thigh after the radiotracer. CT data was obtained and used for attenuation correction and anatomic localization.   FASTING BLOOD GLUCOSE:  Value: 93 mg/dl   COMPARISON:  PET-CT 03/29/2015   FINDINGS: NECK   No hypermetabolic lymph nodes in the neck.   CHEST   No hypermetabolic mediastinal or hilar nodes. No suspicious pulmonary nodules on the CT scan.   ABDOMEN/PELVIS   No abnormal hypermetabolic activity within the liver, pancreas, adrenal glands, or spleen. No hypermetabolic lymph nodes in the abdomen or pelvis.   Liver has a nodular contour.  No focal lesion.   SKELETON   There are 2 new foci of radiotracer uptake within the skeleton. One within  the LEFT iliac bone with SUV max equal 5.5. Second within the T7 vertebral body. Several additional foci of mild metabolic activity may be increased in spine. Multiple sclerotic lesions the throughout the spine and pelvis are not hypermetabolic and not changed in size.   IMPRESSION: 1. Concern for mild  progression of skeletal metastasis with this several lesions within the spine and 1 lesion in the LEFT iliac wing with mild to moderate metabolic activity new from prior. 2. Majority of the metastasis sclerotic skeletal metastasis are not hypermetabolic and not changed in size. 3. No evidence of soft tissue metastasis.     Electronically Signed   By: Suzy Bouchard M.D.   On: 07/05/2015 10:40  CLINICAL DATA:  50 year old female with stage IV breast cancer, including brain and bone metastases status post whole brain radiation therapy in May and June 2016. Restaging. Subsequent encounter.   EXAM: MRI HEAD WITHOUT AND WITH CONTRAST   TECHNIQUE: Multiplanar, multiecho pulse sequences of the brain and surrounding structures were obtained without and with intravenous contrast.   CONTRAST:  51m MULTIHANCE GADOBENATE DIMEGLUMINE 529 MG/ML IV SOLN   COMPARISON:  Head CT without contrast 06/02/2015. Brain MRI 03/29/2015 and earlier.   FINDINGS: Since November the residual right caudate metastasis is no longer enhancing. Minimal residual enhancement of left inferior cerebellar metastases (series 10, image 29), stable. No other residual enhancing metastasis identified. No new brain metastasis identified. No dural thickening. No leptomeningeal enhancement identified.   Cerebral volume is stable. Stable mild residual FLAIR hyperintensity at the site of the larger treated metastases. No acute cerebral edema or new signal abnormality since November.   No restricted diffusion to suggest acute infarction. No midline shift, mass effect, ventriculomegaly, extra-axial collection or acute  intracranial hemorrhage. Occasional punctate areas of hemosiderin in the brain are stable. Major intracranial vascular flow voids are stable.   Cervicomedullary junction and pituitary are within normal limits. Negative visualized cervical spine and spinal cord. Visible bone marrow signal is stable with no destructive osseous lesion identified.   Stable mastoid effusions. Negative nasopharynx. Minimal paranasal sinus mucosal thickening. Stable orbit and scalp soft tissues.   IMPRESSION: 1. Satisfactory post treatment appearance of the brain. Interval resolved enhancing right caudate metastasis, and minimal punctate residual enhancing metastatic disease at the inferior left cerebellum. 2. No new metastatic disease or new intracranial abnormality.     Electronically Signed   By: HGenevie AnnM.D.   On: 07/16/2015 09:20   ASSESSMENT and THERAPY PLAN:  Stage IV ER positive, HER-2 positive carcinoma of the breast Widespread Brain Metastases End of Life issues Insomnia Anxiety UE DVT, RIJ and R subclavian Cancer related Fatigue Decline in EF, Herceptin held. Herceptin restarted on 04/12/2015 06/2014 progression of disease (bone only) with rise in tumor markers, abnormal PET Zoladex, arimidex, herceptin  She has an upcoming oophorectomy with her ob/gyn. She is overall doing well. Tumor marker has fallen with "castration."  Her MUGA is still above normal. We will repeat it in 8 weeks, from the 30th. I wonder about the results from January, seems to be an outlier.   Mrs. Slagter checked her meds and does not need a refill on anything today, but may need some next week.  We will see her back in 3 weeks. We can discuss repeat PET/CT in the near future.   All questions were answered. The patient knows to call the clinic with any problems, questions or concerns. We can certainly see the patient much sooner if necessary.   This note was signed electronically.  This document serves as a  record of services personally performed by SAncil Linsey MD. It was created on her behalf by KToni Amend a trained medical scribe. The creation of this record is based on the scribe's personal observations and the provider's statements  to them. This document has been checked and approved by the attending provider.  I have reviewed the above documentation for accuracy and completeness, and I agree with the above.  Kelby Fam. Whitney Muse, MD

## 2015-08-16 NOTE — Progress Notes (Signed)
Please see other encounter for documentation.   

## 2015-08-16 NOTE — Progress Notes (Signed)
[name] presents today for injection per MD orders. Zoladex administered SQ in left Abdomen. Administration without incident. Patient tolerated well.  Patient also tolerated infusion well.  VSS.

## 2015-08-16 NOTE — Patient Instructions (Signed)
[facility] Discharge Instructions for Patients Receiving Chemotherapy   Beginning January 23rd 2017 lab work for the [facility] will be done in the  Main lab at [facility] on 1st floor. If you have a lab appointment with the [facility] please come in thru the  Main Entrance and check in at the main information desk   Today you received the following chemotherapy agents: Herceptin and Zoladex.     If you develop nausea and vomiting, or diarrhea that is not controlled by your medication, call the clinic.  The clinic phone number is [phone]. Office hours are Monday-Friday 8:30am-5:00pm.  BELOW ARE SYMPTOMS THAT SHOULD BE REPORTED IMMEDIATELY:  *FEVER GREATER THAN 101.0 F  *CHILLS WITH OR WITHOUT FEVER  NAUSEA AND VOMITING THAT IS NOT CONTROLLED WITH YOUR NAUSEA MEDICATION  *UNUSUAL SHORTNESS OF BREATH  *UNUSUAL BRUISING OR BLEEDING  TENDERNESS IN MOUTH AND THROAT WITH OR WITHOUT PRESENCE OF ULCERS  *URINARY PROBLEMS  *BOWEL PROBLEMS  UNUSUAL RASH Items with * indicate a potential emergency and should be followed up as soon as possible. If you have an emergency after office hours please contact your primary care physician or go to the nearest emergency department.  Please call the clinic during office hours if you have any questions or concerns.   You may also contact the Patient Navigator at [phone] should you have any questions or need assistance in obtaining follow up care.      Resources For Cancer Patients and their Caregivers ? American Cancer Society: Can assist with transportation, wigs, general needs, runs Look Good Feel Better.        801 854 6089 ? Cancer Care: Provides financial assistance, online support groups, medication/co-pay assistance.  1-800-813-HOPE 2531982985) ? [place] Assists [place] cancer patients and their families through emotional , educational and financial support.   864-219-2775 ? [place] DSS Where to apply for food stamps, Medicaid and utility assistance. (930) 417-9297 ? [facility]: Transportation to medical appointments. 6814565844 ? Social Security Administration: May apply for disability if have a Stage IV cancer. [phone] 669-517-2023 ? [employer], Disability and Transit Services: Assists with nutrition, care and transit needs. 4304043627

## 2015-08-19 ENCOUNTER — Other Ambulatory Visit (HOSPITAL_COMMUNITY): Payer: Self-pay | Admitting: Oncology

## 2015-08-19 ENCOUNTER — Encounter (HOSPITAL_BASED_OUTPATIENT_CLINIC_OR_DEPARTMENT_OTHER): Payer: Self-pay | Admitting: *Deleted

## 2015-08-19 NOTE — Progress Notes (Signed)
NPO AFTER MN.  ARRIVE AT 0930. DR TOMBLIN ORDERED T&S BUT OUT OF OFFICE ALL THIS WEEK TO ASK IF NEEDED FOR THIS PROCEDURE. DR TOMBLIN WILL NEED TO BE CALLED AM DOS TO VERIFY THIS ORDER.  PT  GETTING OTHER LAB WORK DONE (CBC, CMET, PT/INR, PTT) ON Thursday 08-22-2015.  WILL TAKE AM MEDS WITH SIPS OF WATER DOS W/ EXCEPTION NO LASIX, [place], [place].  PT AWARE OWER AT MAIN.

## 2015-08-20 ENCOUNTER — Encounter: Payer: Self-pay | Admitting: *Deleted

## 2015-08-20 ENCOUNTER — Ambulatory Visit (HOSPITAL_COMMUNITY): Payer: 59 | Admitting: Physical Therapy

## 2015-08-20 NOTE — Progress Notes (Signed)
[facility] Clinical Social Work  Pt attended [employer] group today. Pt continues to do well in processing her illness and is enjoying volunteering at [employer].   Clinical Social Work interventions: Support group   [name], [place] Tuesdays   [phone]

## 2015-08-22 ENCOUNTER — Ambulatory Visit (HOSPITAL_COMMUNITY): Payer: 59

## 2015-08-22 DIAGNOSIS — C787 Secondary malignant neoplasm of liver and intrahepatic bile duct: Secondary | ICD-10-CM | POA: Diagnosis not present

## 2015-08-22 DIAGNOSIS — Z807 Family history of other malignant neoplasms of lymphoid, hematopoietic and related tissues: Secondary | ICD-10-CM | POA: Diagnosis not present

## 2015-08-22 DIAGNOSIS — Z4009 Encounter for prophylactic removal of other organ: Secondary | ICD-10-CM | POA: Diagnosis not present

## 2015-08-22 DIAGNOSIS — Z923 Personal history of irradiation: Secondary | ICD-10-CM | POA: Diagnosis not present

## 2015-08-22 DIAGNOSIS — Z801 Family history of malignant neoplasm of trachea, bronchus and lung: Secondary | ICD-10-CM | POA: Diagnosis not present

## 2015-08-22 DIAGNOSIS — Z808 Family history of malignant neoplasm of other organs or systems: Secondary | ICD-10-CM | POA: Diagnosis not present

## 2015-08-22 DIAGNOSIS — Z4002 Encounter for prophylactic removal of ovary: Secondary | ICD-10-CM | POA: Diagnosis present

## 2015-08-22 DIAGNOSIS — Z9221 Personal history of antineoplastic chemotherapy: Secondary | ICD-10-CM | POA: Diagnosis not present

## 2015-08-22 DIAGNOSIS — C7931 Secondary malignant neoplasm of brain: Secondary | ICD-10-CM | POA: Diagnosis not present

## 2015-08-22 DIAGNOSIS — Z87891 Personal history of nicotine dependence: Secondary | ICD-10-CM | POA: Diagnosis not present

## 2015-08-22 DIAGNOSIS — Z6837 Body mass index (BMI) 37.0-37.9, adult: Secondary | ICD-10-CM | POA: Diagnosis not present

## 2015-08-22 DIAGNOSIS — Z86718 Personal history of other venous thrombosis and embolism: Secondary | ICD-10-CM | POA: Diagnosis not present

## 2015-08-22 DIAGNOSIS — Z853 Personal history of malignant neoplasm of breast: Secondary | ICD-10-CM | POA: Diagnosis not present

## 2015-08-22 DIAGNOSIS — C7951 Secondary malignant neoplasm of bone: Secondary | ICD-10-CM | POA: Diagnosis not present

## 2015-08-22 DIAGNOSIS — Z800 Family history of malignant neoplasm of digestive organs: Secondary | ICD-10-CM | POA: Diagnosis not present

## 2015-08-22 DIAGNOSIS — Z170 Estrogen receptor positive status [ER+]: Secondary | ICD-10-CM | POA: Diagnosis not present

## 2015-08-22 LAB — CBC
HCT: 38.8 % (ref 36.0–46.0)
Hemoglobin: 12.6 g/dL (ref 12.0–15.0)
MCH: 28.7 pg (ref 26.0–34.0)
MCHC: 32.5 g/dL (ref 30.0–36.0)
MCV: 88.4 fL (ref 78.0–100.0)
PLATELETS: 225 10*3/uL (ref 150–400)
RBC: 4.39 MIL/uL (ref 3.87–5.11)
RDW: 14.3 % (ref 11.5–15.5)
WBC: 6.1 10*3/uL (ref 4.0–10.5)

## 2015-08-22 LAB — COMPREHENSIVE METABOLIC PANEL
ALK PHOS: 54 U/L (ref 38–126)
ALT: 20 U/L (ref 14–54)
ANION GAP: 9 (ref 5–15)
AST: 20 U/L (ref 15–41)
Albumin: 4 g/dL (ref 3.5–5.0)
BUN: 11 mg/dL (ref 6–20)
CALCIUM: 9.1 mg/dL (ref 8.9–10.3)
CO2: 29 mmol/L (ref 22–32)
CREATININE: 0.86 mg/dL (ref 0.44–1.00)
Chloride: 103 mmol/L (ref 101–111)
GFR calc non Af Amer: >60 mL/min (ref >60)
Glucose, Bld: 155 mg/dL — ABNORMAL HIGH (ref 65–99)
Potassium: 3.5 mmol/L (ref 3.5–5.1)
SODIUM: 141 mmol/L (ref 135–145)
Total Bilirubin: 0.1 mg/dL — ABNORMAL LOW (ref 0.3–1.2)
Total Protein: 7.1 g/dL (ref 6.5–8.1)

## 2015-08-22 LAB — PROTIME-INR
INR: 1.57 — AB (ref 0.00–1.49)
PROTHROMBIN TIME: 18.8 s — AB (ref 11.6–15.2)

## 2015-08-22 LAB — APTT: APTT: 33 s (ref 24–37)

## 2015-08-23 NOTE — H&P (Signed)
[name] is an 50 y.o. female with metastatic post menopausal breast cancer presents for laparoscopic BSO on recommendation of her oncologist. Pelvic U/S in office is without mass or free fluid.  Pertinent Gynecological History: Menses: S/P LAVH 2012 Bleeding: N/A Contraception: none DES exposure: denies Blood transfusions: none Sexually transmitted diseases: no past history Previous GYN Procedures: LAVH  Last mammogram: per oncologist Date: N/A Last pap: normal Date: 2015 OB History: G2, P2   Menstrual History: Menarche age: unknown  No LMP recorded. Patient has had a hysterectomy.    Past Medical History  Diagnosis Date  . Depression   . Anxiety   . Family history of prostate cancer   . Drug-induced cardiomyopathy ([place])     per last MUGA (08-08-2015), ef 56.5/ per last echo 05-18-2014 ef 60-65%  . PONV (postoperative nausea and vomiting)     pt states scope patch does well  . History of DVT (deep vein thrombosis)     07-09-2014  upper right extremity-  RIJ and right subclavian--  resolved  . Chronic pain syndrome     secondary to cancer   . GERD (gastroesophageal reflux disease)   . History of colon polyps     07-13-2013  benign  . History of gastritis     erosive  . History of radiation therapy     12 fractions to L3 - S3, 30Gy and left spacula 20Gy (06-20-2014 to 07-10-2014) //  whole brain rxt (10-09-2014 to 10-26-2014)  . History of small bowel obstruction     S/P RESECTION 2008  . Breast cancer, stage 4 [facility] oncologist-  dr Larene [place] ([facility])    dx 12/ 2015 -- breast cancer Stage 4,  ER/HER2 +,  w/  liver, brain and  bone mets/  chemotherapy and radiation therapy  . Breast cancer metastasized to multiple sites [facility])     liver, brain, and bone  . Migraine   . History of pneumonia     HCAP 06-07-2015--  resolved per cxr 07-04-2015  . Anticoagulated     xarelto    Past Surgical History  Procedure Laterality Date  . Breast reduction  surgery  03/17/2011    Procedure: MAMMARY REDUCTION BILATERAL (BREAST);  Surgeon: Mary A Contogiannis;  Location: [facility];  Service: Plastics;  Laterality: Bilateral;  . Colonoscopy N/A 07/13/2013    Procedure: COLONOSCOPY;  Surgeon: Rogene Houston, MD;  [facility];  Service: Endoscopy;  Laterality: N/A;  930  . Portacath placement  05-17-2014  . Esophagogastroduodenoscopy N/A 05/25/2014    Procedure: ESOPHAGOGASTRODUODENOSCOPY (EGD);  Surgeon: Rogene Houston, MD;  [facility];  Service: Endoscopy;  Laterality: N/A;  155  . Colonoscopy N/A 11/26/2014    Procedure: COLONOSCOPY;  Surgeon: Rogene Houston, MD;  [facility];  Service: Endoscopy;  Laterality: N/A;  730  . Shoulder arthroscopy with rotator cuff repair Right 2002  . Cervical fusion  2003    C5 -- C6  . Knee arthroscopy Right 2005  . Laparoscopic cholecystectomy  11-17-2002  . Dx laparoscopy w/ partial small bowel resection and appendectomy  04-13-2007  . Cataract extraction w/ intraocular lens  implant, bilateral  2008  . Laparoscopic assisted vaginal hysterectomy  10-13-2010    w/ Bx Left Fallopian tube and Aspiration Right Ovarian Cyst  . D & c hysteroscopy/  resection endometrial mass/  novasure endometrial ablation  04-11-2010  . Transthoracic echocardiogram  05-18-2014    ef  60-65%//   last MUGA  (08-08-2015)  ef 56.6%      Family History  Problem Relation Age of Onset  . Diabetes Father   . Heart attack Maternal Grandmother 30    multiple over lifetime.  . Cancer Maternal Grandmother 64    NOS  . Prostate cancer Maternal Grandfather     dx in his 78s  . Lung cancer Paternal Grandfather     dx <50  . Lymphoma Maternal Aunt     dx in her 76s  . Melanoma Cousin 84    maternal first cousin  . Brain cancer Cousin     paternal first cousin dx under 76  . Prostate cancer Other     MGF's father  . Colon cancer Other     MGM's mother    Social History:  reports  that she quit smoking about 21 years ago. Her smoking use included Cigarettes. She has never used smokeless tobacco. She reports that she drinks alcohol. She reports that she does not use illicit drugs.  Allergies: No Known Allergies  No prescriptions prior to admission    ROS  Height '5\' 8"'  (1.727 m). Physical Exam  Lungs CTA Cor RRR Abdomen soft, obese, NT without masses Pelvic- vulva/vagina without lesion, adnexa NT without masses  No results found for this or any previous visit (from the past 24 hour(s)).  No results found.  Assessment/Plan: 50 yo G2P2 S/P LAVH with metastatic breast cancer presents for L/S BSO Risks reviewed including infection, organ damage, bleeding/transfusion-HIV/Hep, DVT/PE, pneumonia, pelvic/abdominal pain, laparotomy, return to OR. Patient will stop Xarelto 2 days before surgery All questions answered. Patient states she understands and agrees  [facility],Cheick Suhr E 08/23/2015, 2:57 PM

## 2015-08-26 ENCOUNTER — Observation Stay (HOSPITAL_BASED_OUTPATIENT_CLINIC_OR_DEPARTMENT_OTHER)
Admission: AD | Admit: 2015-08-26 | Discharge: 2015-08-27 | Disposition: A | Discharge status: Home / Self Care | Payer: 59 | Source: Ambulatory Visit | Attending: Obstetrics and Gynecology | Admitting: Obstetrics and Gynecology

## 2015-08-26 ENCOUNTER — Ambulatory Visit (HOSPITAL_BASED_OUTPATIENT_CLINIC_OR_DEPARTMENT_OTHER): Payer: 59 | Admitting: Anesthesiology

## 2015-08-26 ENCOUNTER — Encounter (HOSPITAL_BASED_OUTPATIENT_CLINIC_OR_DEPARTMENT_OTHER): Payer: Self-pay | Admitting: *Deleted

## 2015-08-26 ENCOUNTER — Encounter (HOSPITAL_COMMUNITY)
Admission: AD | Disposition: A | Discharge status: Home / Self Care | Payer: Self-pay | Source: Ambulatory Visit | Attending: Obstetrics and Gynecology

## 2015-08-26 DIAGNOSIS — Z87891 Personal history of nicotine dependence: Secondary | ICD-10-CM | POA: Insufficient documentation

## 2015-08-26 DIAGNOSIS — C7951 Secondary malignant neoplasm of bone: Secondary | ICD-10-CM | POA: Insufficient documentation

## 2015-08-26 DIAGNOSIS — Z923 Personal history of irradiation: Secondary | ICD-10-CM | POA: Insufficient documentation

## 2015-08-26 DIAGNOSIS — Z6837 Body mass index (BMI) 37.0-37.9, adult: Secondary | ICD-10-CM | POA: Insufficient documentation

## 2015-08-26 DIAGNOSIS — Z807 Family history of other malignant neoplasms of lymphoid, hematopoietic and related tissues: Secondary | ICD-10-CM | POA: Insufficient documentation

## 2015-08-26 DIAGNOSIS — Z86718 Personal history of other venous thrombosis and embolism: Secondary | ICD-10-CM | POA: Insufficient documentation

## 2015-08-26 DIAGNOSIS — C787 Secondary malignant neoplasm of liver and intrahepatic bile duct: Secondary | ICD-10-CM | POA: Insufficient documentation

## 2015-08-26 DIAGNOSIS — Z4002 Encounter for prophylactic removal of ovary: Secondary | ICD-10-CM | POA: Diagnosis not present

## 2015-08-26 DIAGNOSIS — C50919 Malignant neoplasm of unspecified site of unspecified female breast: Secondary | ICD-10-CM | POA: Diagnosis present

## 2015-08-26 DIAGNOSIS — C7931 Secondary malignant neoplasm of brain: Secondary | ICD-10-CM | POA: Insufficient documentation

## 2015-08-26 DIAGNOSIS — Z170 Estrogen receptor positive status [ER+]: Secondary | ICD-10-CM | POA: Insufficient documentation

## 2015-08-26 DIAGNOSIS — Z800 Family history of malignant neoplasm of digestive organs: Secondary | ICD-10-CM | POA: Insufficient documentation

## 2015-08-26 DIAGNOSIS — Z808 Family history of malignant neoplasm of other organs or systems: Secondary | ICD-10-CM | POA: Insufficient documentation

## 2015-08-26 DIAGNOSIS — Z4009 Encounter for prophylactic removal of other organ: Secondary | ICD-10-CM | POA: Insufficient documentation

## 2015-08-26 DIAGNOSIS — Z853 Personal history of malignant neoplasm of breast: Secondary | ICD-10-CM | POA: Insufficient documentation

## 2015-08-26 DIAGNOSIS — Z9221 Personal history of antineoplastic chemotherapy: Secondary | ICD-10-CM | POA: Insufficient documentation

## 2015-08-26 DIAGNOSIS — Z801 Family history of malignant neoplasm of trachea, bronchus and lung: Secondary | ICD-10-CM | POA: Insufficient documentation

## 2015-08-26 HISTORY — DX: Personal history of pneumonia (recurrent): Z87.01

## 2015-08-26 HISTORY — DX: Long term (current) use of anticoagulants: Z79.01

## 2015-08-26 HISTORY — DX: Malignant neoplasm of unspecified site of unspecified female breast: C50.919

## 2015-08-26 HISTORY — DX: Gastro-esophageal reflux disease without esophagitis: K21.9

## 2015-08-26 HISTORY — DX: Personal history of other diseases of the digestive system: Z87.19

## 2015-08-26 HISTORY — PX: LAPAROSCOPIC SALPINGO OOPHERECTOMY: SHX5927

## 2015-08-26 HISTORY — DX: Migraine, unspecified, not intractable, without status migrainosus: G43.909

## 2015-08-26 HISTORY — DX: Personal history of colonic polyps: Z86.010

## 2015-08-26 HISTORY — DX: Chronic pain syndrome: G89.4

## 2015-08-26 HISTORY — DX: Personal history of colon polyps, unspecified: Z86.0100

## 2015-08-26 HISTORY — DX: Personal history of other venous thrombosis and embolism: Z86.718

## 2015-08-26 HISTORY — DX: Personal history of irradiation: Z92.3

## 2015-08-26 LAB — ABO/RH: ABO/RH(D): A NEG

## 2015-08-26 LAB — CBC
HEMATOCRIT: 34.2 % — AB (ref 36.0–46.0)
Hemoglobin: 11.5 g/dL — ABNORMAL LOW (ref 12.0–15.0)
MCH: 29.6 pg (ref 26.0–34.0)
MCHC: 33.6 g/dL (ref 30.0–36.0)
MCV: 87.9 fL (ref 78.0–100.0)
PLATELETS: 179 10*3/uL (ref 150–400)
RBC: 3.89 MIL/uL (ref 3.87–5.11)
RDW: 14.7 % (ref 11.5–15.5)
WBC: 9.3 10*3/uL (ref 4.0–10.5)

## 2015-08-26 LAB — TYPE AND SCREEN
ABO/RH(D): A NEG
ANTIBODY SCREEN: NEGATIVE

## 2015-08-26 SURGERY — SALPINGO-OOPHORECTOMY, LAPAROSCOPIC
Anesthesia: General | Site: Abdomen | Laterality: Bilateral

## 2015-08-26 MED ORDER — SUGAMMADEX SODIUM 200 MG/2ML IV SOLN
INTRAVENOUS | Status: DC | PRN
  Administered 2015-08-26: 200 mg via INTRAVENOUS

## 2015-08-26 MED ORDER — PROPOFOL 10 MG/ML IV BOLUS
INTRAVENOUS | Status: AC
  Filled 2015-08-26: qty 20

## 2015-08-26 MED ORDER — LACTATED RINGERS IV SOLN
INTRAVENOUS | Status: DC
  Administered 2015-08-26: 11:00:00 via INTRAVENOUS
  Filled 2015-08-26: qty 1000

## 2015-08-26 MED ORDER — METHYLPHENIDATE HCL ER (LA) 10 MG PO CP24
20.0000 mg | ORAL_CAPSULE | Freq: Every morning | ORAL | Status: DC
  Filled 2015-08-26: qty 2

## 2015-08-26 MED ORDER — DEXAMETHASONE SODIUM PHOSPHATE 4 MG/ML IJ SOLN
INTRAMUSCULAR | Status: DC | PRN
  Administered 2015-08-26: 10 mg via INTRAVENOUS

## 2015-08-26 MED ORDER — SCOPOLAMINE 1 MG/3DAYS TD PT72
1.0000 | MEDICATED_PATCH | TRANSDERMAL | Status: DC
  Administered 2015-08-26: 1.5 mg via TRANSDERMAL
  Filled 2015-08-26: qty 1

## 2015-08-26 MED ORDER — SENNA 8.6 MG PO TABS
1.0000 | ORAL_TABLET | Freq: Two times a day (BID) | ORAL | Status: DC
  Administered 2015-08-26 – 2015-08-27 (×3): 8.6 mg via ORAL
  Filled 2015-08-26 (×4): qty 1

## 2015-08-26 MED ORDER — ACETAMINOPHEN 325 MG PO TABS
650.0000 mg | ORAL_TABLET | ORAL | Status: DC | PRN
  Filled 2015-08-26: qty 2

## 2015-08-26 MED ORDER — HYDROMORPHONE HCL 1 MG/ML IJ SOLN
0.2500 mg | INTRAMUSCULAR | Status: DC | PRN
  Administered 2015-08-26: 0.5 mg via INTRAVENOUS
  Filled 2015-08-26: qty 1

## 2015-08-26 MED ORDER — POTASSIUM CHLORIDE CRYS ER 20 MEQ PO TBCR
20.0000 meq | EXTENDED_RELEASE_TABLET | Freq: Two times a day (BID) | ORAL | Status: DC
  Administered 2015-08-26 – 2015-08-27 (×2): 20 meq via ORAL
  Filled 2015-08-26 (×6): qty 1

## 2015-08-26 MED ORDER — DEXAMETHASONE SODIUM PHOSPHATE 10 MG/ML IJ SOLN
INTRAMUSCULAR | Status: AC
  Filled 2015-08-26: qty 1

## 2015-08-26 MED ORDER — LIDOCAINE HCL (CARDIAC) 20 MG/ML IV SOLN
INTRAVENOUS | Status: AC
  Filled 2015-08-26: qty 5

## 2015-08-26 MED ORDER — PROPOFOL 10 MG/ML IV BOLUS
INTRAVENOUS | Status: DC | PRN
  Administered 2015-08-26: 150 mg via INTRAVENOUS

## 2015-08-26 MED ORDER — FENTANYL CITRATE (PF) 250 MCG/5ML IJ SOLN
INTRAMUSCULAR | Status: AC
  Filled 2015-08-26: qty 5

## 2015-08-26 MED ORDER — BUPIVACAINE HCL (PF) 0.5 % IJ SOLN
INTRAMUSCULAR | Status: DC | PRN
  Administered 2015-08-26: 20 mL

## 2015-08-26 MED ORDER — LIDOCAINE HCL (CARDIAC) 20 MG/ML IV SOLN
INTRAVENOUS | Status: DC | PRN
  Administered 2015-08-26: 60 mg via INTRAVENOUS

## 2015-08-26 MED ORDER — CEFAZOLIN SODIUM 10 G IJ SOLR
3.0000 g | INTRAMUSCULAR | Status: AC
  Administered 2015-08-26: 3 g via INTRAVENOUS
  Filled 2015-08-26: qty 3000

## 2015-08-26 MED ORDER — KETOROLAC TROMETHAMINE 30 MG/ML IJ SOLN
30.0000 mg | Freq: Once | INTRAMUSCULAR | Status: DC
  Filled 2015-08-26: qty 1

## 2015-08-26 MED ORDER — PANTOPRAZOLE SODIUM 40 MG PO TBEC
40.0000 mg | DELAYED_RELEASE_TABLET | Freq: Every day | ORAL | Status: DC
  Administered 2015-08-27: 40 mg via ORAL
  Filled 2015-08-26 (×2): qty 1

## 2015-08-26 MED ORDER — ROCURONIUM BROMIDE 100 MG/10ML IV SOLN
INTRAVENOUS | Status: DC | PRN
  Administered 2015-08-26: 50 mg via INTRAVENOUS

## 2015-08-26 MED ORDER — LACTATED RINGERS IV SOLN
INTRAVENOUS | Status: DC
  Administered 2015-08-26 – 2015-08-27 (×3): via INTRAVENOUS
  Filled 2015-08-26: qty 1000

## 2015-08-26 MED ORDER — MIDAZOLAM HCL 2 MG/2ML IJ SOLN
INTRAMUSCULAR | Status: AC
  Filled 2015-08-26: qty 2

## 2015-08-26 MED ORDER — PROMETHAZINE HCL 25 MG/ML IJ SOLN
6.2500 mg | INTRAMUSCULAR | Status: DC | PRN
  Filled 2015-08-26: qty 1

## 2015-08-26 MED ORDER — ZOLPIDEM TARTRATE 5 MG PO TABS
5.0000 mg | ORAL_TABLET | Freq: Every evening | ORAL | Status: DC | PRN
  Administered 2015-08-26: 5 mg via ORAL
  Filled 2015-08-26 (×2): qty 1

## 2015-08-26 MED ORDER — HYDROMORPHONE HCL 1 MG/ML IJ SOLN
INTRAMUSCULAR | Status: AC
  Filled 2015-08-26: qty 1

## 2015-08-26 MED ORDER — ONDANSETRON HCL 4 MG/2ML IJ SOLN
INTRAMUSCULAR | Status: AC
  Filled 2015-08-26: qty 2

## 2015-08-26 MED ORDER — ALUM & MAG HYDROXIDE-SIMETH 200-200-20 MG/5ML PO SUSP
30.0000 mL | ORAL | Status: DC | PRN
  Filled 2015-08-26: qty 30

## 2015-08-26 MED ORDER — OXYCODONE-ACETAMINOPHEN 5-325 MG PO TABS
1.0000 | ORAL_TABLET | Freq: Four times a day (QID) | ORAL | Status: DC | PRN
  Administered 2015-08-26: 1 via ORAL
  Administered 2015-08-27: 2 via ORAL
  Filled 2015-08-26: qty 1
  Filled 2015-08-26 (×2): qty 2

## 2015-08-26 MED ORDER — FENTANYL CITRATE (PF) 100 MCG/2ML IJ SOLN
INTRAMUSCULAR | Status: DC | PRN
  Administered 2015-08-26 (×2): 50 ug via INTRAVENOUS
  Administered 2015-08-26: 100 ug via INTRAVENOUS

## 2015-08-26 MED ORDER — MENTHOL 3 MG MT LOZG
1.0000 | LOZENGE | OROMUCOSAL | Status: DC | PRN
  Filled 2015-08-26: qty 9

## 2015-08-26 MED ORDER — SODIUM CHLORIDE 0.9 % IR SOLN
Status: DC | PRN
  Administered 2015-08-26: 500 mL

## 2015-08-26 MED ORDER — MIDAZOLAM HCL 5 MG/5ML IJ SOLN
INTRAMUSCULAR | Status: DC | PRN
  Administered 2015-08-26: 2 mg via INTRAVENOUS

## 2015-08-26 MED ORDER — KETOROLAC TROMETHAMINE 30 MG/ML IJ SOLN
INTRAMUSCULAR | Status: AC
  Filled 2015-08-26: qty 1

## 2015-08-26 MED ORDER — ONDANSETRON HCL 4 MG/2ML IJ SOLN
INTRAMUSCULAR | Status: AC
Start: 2015-08-26 — End: 2015-08-26
  Filled 2015-08-26: qty 2

## 2015-08-26 MED ORDER — LORAZEPAM 0.5 MG PO TABS
0.5000 mg | ORAL_TABLET | Freq: Four times a day (QID) | ORAL | Status: DC | PRN
  Filled 2015-08-26: qty 1

## 2015-08-26 MED ORDER — SCOPOLAMINE 1 MG/3DAYS TD PT72
MEDICATED_PATCH | TRANSDERMAL | Status: AC
  Filled 2015-08-26: qty 1

## 2015-08-26 MED ORDER — CEFAZOLIN SODIUM 1-5 GM-% IV SOLN
INTRAVENOUS | Status: AC
  Filled 2015-08-26: qty 50

## 2015-08-26 MED ORDER — ONDANSETRON HCL 4 MG/2ML IJ SOLN
4.0000 mg | Freq: Four times a day (QID) | INTRAMUSCULAR | Status: DC | PRN
  Filled 2015-08-26: qty 2

## 2015-08-26 MED ORDER — CETYLPYRIDINIUM CHLORIDE 0.05 % MT LIQD
7.0000 mL | Freq: Two times a day (BID) | OROMUCOSAL | Status: DC
  Administered 2015-08-26 (×2): 7 mL via OROMUCOSAL

## 2015-08-26 MED ORDER — ROCURONIUM BROMIDE 100 MG/10ML IV SOLN
INTRAVENOUS | Status: AC
Start: 2015-08-26 — End: 2015-08-26
  Filled 2015-08-26: qty 1

## 2015-08-26 MED ORDER — CEFAZOLIN SODIUM-DEXTROSE 2-4 GM/100ML-% IV SOLN
INTRAVENOUS | Status: AC
  Filled 2015-08-26: qty 100

## 2015-08-26 MED ORDER — SODIUM CHLORIDE 0.9% FLUSH: 10.0000 mL | INTRAVENOUS | Status: DC | PRN

## 2015-08-26 MED ORDER — VENLAFAXINE HCL ER 150 MG PO CP24
150.0000 mg | ORAL_CAPSULE | Freq: Every day | ORAL | Status: DC
  Administered 2015-08-27: 150 mg via ORAL
  Filled 2015-08-26 (×2): qty 1

## 2015-08-26 MED ORDER — SUGAMMADEX SODIUM 200 MG/2ML IV SOLN
INTRAVENOUS | Status: AC
  Filled 2015-08-26: qty 2

## 2015-08-26 MED ORDER — HYDROMORPHONE HCL 1 MG/ML IJ SOLN
0.2000 mg | INTRAMUSCULAR | Status: DC | PRN
  Administered 2015-08-26 (×2): 0.6 mg via INTRAVENOUS
  Filled 2015-08-26 (×3): qty 1

## 2015-08-26 MED ORDER — MORPHINE SULFATE ER 30 MG PO TBCR
30.0000 mg | EXTENDED_RELEASE_TABLET | Freq: Two times a day (BID) | ORAL | Status: DC
  Administered 2015-08-26 – 2015-08-27 (×2): 30 mg via ORAL
  Filled 2015-08-26 (×3): qty 1

## 2015-08-26 MED ORDER — ONDANSETRON HCL 4 MG/2ML IJ SOLN
INTRAMUSCULAR | Status: DC | PRN
  Administered 2015-08-26: 4 mg via INTRAVENOUS

## 2015-08-26 MED ORDER — NEOSTIGMINE METHYLSULFATE 10 MG/10ML IV SOLN
INTRAVENOUS | Status: AC
  Filled 2015-08-26: qty 1

## 2015-08-26 MED ORDER — GLYCOPYRROLATE 0.2 MG/ML IJ SOLN
INTRAMUSCULAR | Status: AC
  Filled 2015-08-26: qty 3

## 2015-08-26 MED ORDER — ONDANSETRON HCL 4 MG PO TABS
4.0000 mg | ORAL_TABLET | Freq: Four times a day (QID) | ORAL | Status: DC | PRN
  Filled 2015-08-26: qty 1

## 2015-08-26 SURGICAL SUPPLY — 60 items
APPLICATOR COTTON TIP 6IN STRL (MISCELLANEOUS) ×3 IMPLANT
BARRIER ADHS 3X4 INTERCEED (GAUZE/BANDAGES/DRESSINGS) IMPLANT
BENZOIN TINCTURE PRP APPL 2/3 (GAUZE/BANDAGES/DRESSINGS) ×3 IMPLANT
BLADE CLIPPER SURG (BLADE) IMPLANT
BLADE SURG 11 STRL SS (BLADE) ×3 IMPLANT
CANISTER SUCTION 2500CC (MISCELLANEOUS) ×3 IMPLANT
CATH ROBINSON RED A/P 16FR (CATHETERS) IMPLANT
CLOSURE WOUND 1/4 X3 (GAUZE/BANDAGES/DRESSINGS)
COVER BACK TABLE 60X90IN (DRAPES) ×3 IMPLANT
COVER MAYO STAND STRL (DRAPES) ×3 IMPLANT
DECANTER SPIKE VIAL GLASS SM (MISCELLANEOUS) IMPLANT
DRAPE LG THREE QUARTER DISP (DRAPES) ×3 IMPLANT
DRAPE UNDERBUTTOCKS STRL (DRAPE) ×3 IMPLANT
DRSG TEGADERM 4X4.75 (GAUZE/BANDAGES/DRESSINGS) ×3 IMPLANT
DRSG TELFA 3X8 NADH (GAUZE/BANDAGES/DRESSINGS) IMPLANT
ELECT REM PT RETURN 9FT ADLT (ELECTROSURGICAL)
ELECTRODE REM PT RTRN 9FT ADLT (ELECTROSURGICAL) IMPLANT
GLOVE BIO SURGEON STRL SZ8 (GLOVE) ×6 IMPLANT
GOWN STRL REUS W/ TWL LRG LVL3 (GOWN DISPOSABLE) ×3 IMPLANT
GOWN STRL REUS W/TWL LRG LVL3 (GOWN DISPOSABLE) ×6
HOLDER FOLEY CATH W/STRAP (MISCELLANEOUS) IMPLANT
KIT ROOM TURNOVER WOR (KITS) ×3 IMPLANT
LIQUID BAND (GAUZE/BANDAGES/DRESSINGS) ×3 IMPLANT
MANIFOLD NEPTUNE II (INSTRUMENTS) IMPLANT
NEEDLE HYPO 25X1 1.5 SAFETY (NEEDLE) ×3 IMPLANT
NEEDLE INSUFFLATION 14GA 120MM (NEEDLE) ×3 IMPLANT
NEEDLE INSUFFLATION 14GA 150MM (NEEDLE) IMPLANT
NS IRRIG 500ML POUR BTL (IV SOLUTION) ×3 IMPLANT
PACK BASIN DAY SURGERY FS (CUSTOM PROCEDURE TRAY) ×3 IMPLANT
PACK LAPAROSCOPY II (CUSTOM PROCEDURE TRAY) ×3 IMPLANT
PAD OB MATERNITY 4.3X12.25 (PERSONAL CARE ITEMS) ×3 IMPLANT
PAD PREP 24X48 CUFFED NSTRL (MISCELLANEOUS) ×3 IMPLANT
PADDING ION DISPOSABLE (MISCELLANEOUS) ×3 IMPLANT
POUCH SPECIMEN RETRIEVAL 10MM (ENDOMECHANICALS) ×3 IMPLANT
SCISSORS LAP 5X35 DISP (ENDOMECHANICALS) IMPLANT
SEALER TISSUE G2 CVD JAW 45CM (ENDOMECHANICALS) ×3 IMPLANT
SET IRRIG TUBING LAPAROSCOPIC (IRRIGATION / IRRIGATOR) IMPLANT
SOLUTION ANTI FOG 6CC (MISCELLANEOUS) ×3 IMPLANT
SOLUTION ELECTROLUBE (MISCELLANEOUS) IMPLANT
SPONGE GAUZE 2X2 8PLY STER LF (GAUZE/BANDAGES/DRESSINGS) ×1
SPONGE GAUZE 2X2 8PLY STRL LF (GAUZE/BANDAGES/DRESSINGS) ×2 IMPLANT
STRIP CLOSURE SKIN 1/4X3 (GAUZE/BANDAGES/DRESSINGS) IMPLANT
SUT VIC AB 3-0 PS2 18 (SUTURE)
SUT VIC AB 3-0 PS2 18XBRD (SUTURE) IMPLANT
SUT VIC AB 4-0 PS2 27 (SUTURE) IMPLANT
SUT VICRYL 0 UR6 27IN ABS (SUTURE) ×6 IMPLANT
SYR 30ML LL (SYRINGE) ×3 IMPLANT
SYR 3ML 23GX1 SAFETY (SYRINGE) IMPLANT
SYR CONTROL 10ML LL (SYRINGE) ×3 IMPLANT
SYRINGE 10CC LL (SYRINGE) ×3 IMPLANT
TOWEL OR 17X24 6PK STRL BLUE (TOWEL DISPOSABLE) ×6 IMPLANT
TRAY DSU PREP LF (CUSTOM PROCEDURE TRAY) ×3 IMPLANT
TRAY FOLEY BAG SILVER LF 14FR (CATHETERS) IMPLANT
TROCAR XCEL NON-BLD 11X100MML (ENDOMECHANICALS) ×3 IMPLANT
TROCAR XCEL NON-BLD 5MMX100MML (ENDOMECHANICALS) ×3 IMPLANT
TUBE CONNECTING 12'X1/4 (SUCTIONS)
TUBE CONNECTING 12X1/4 (SUCTIONS) IMPLANT
TUBING INSUFFLATION 10FT LAP (TUBING) ×3 IMPLANT
WARMER LAPAROSCOPE (MISCELLANEOUS) ×3 IMPLANT
WATER STERILE IRR 500ML POUR (IV SOLUTION) ×3 IMPLANT

## 2015-08-26 NOTE — Transfer of Care (Signed)
Immediate Anesthesia Transfer of Care Note  Patient: [name]  Procedure(s) Performed: Procedure(s): LAPAROSCOPIC SALPINGO OOPHORECTOMY, bilateral (Bilateral)  Patient Location: PACU  Anesthesia Type:General  Level of Consciousness: sedated and responds to stimulation  Airway & Oxygen Therapy: Patient Spontanous Breathing and Patient connected to nasal cannula oxygen  Post-op Assessment: Report given to RN and Post -op Vital signs reviewed and stable  Post vital signs: Reviewed and stable  Last Vitals: 128/56, 84, 11, 97%, 97.8 Filed Vitals:   08/26/15 0920  BP: 117/65  Pulse: 78  Temp: 36.6 C  Resp: 16    Complications: No apparent anesthesia complications

## 2015-08-26 NOTE — Progress Notes (Signed)
08/26/2015  12:01 PM  PATIENT:  [name]  50 y.o. female  PRE-OPERATIVE DIAGNOSIS:  estrogen receptor breast cancer  POST-OPERATIVE DIAGNOSIS:  estrogen receptor breast cancer  PROCEDURE:  Procedure(s): LAPAROSCOPIC SALPINGO OOPHORECTOMY, bilateral (Bilateral)  SURGEON:  Surgeon(s) and Role:    * Everlene Farrier, MD - Primary  PHYSICIAN ASSISTANT:   ASSISTANTS: none   ANESTHESIA:   general  EBL:  Total I/O In: -  Out: 5 [Blood:5]  BLOOD ADMINISTERED:none  DRAINS: none   LOCAL MEDICATIONS USED:  MARCAINE    and Amount: 30 ml  SPECIMEN:  Source of Specimen:  bilateral fallopian tubes and ovaries  DISPOSITION OF SPECIMEN:  PATHOLOGY  COUNTS:  YES  TOURNIQUET:  * No tourniquets in log *  DICTATION: .Other Dictation: Dictation Number [id number]  PLAN OF CARE: Admit for overnight observation  PATIENT DISPOSITION:  PACU - hemodynamically stable.   Delay start of Pharmacological VTE agent (>24hrs) due to surgical blood loss or risk of bleeding: not applicable

## 2015-08-26 NOTE — Progress Notes (Signed)
No changes to H&P per patient history Reviewed with patient procedure-L/S BSO Patient last took Xarelto 08/22/15 All question answered Patient states she understands and agrees

## 2015-08-26 NOTE — Progress Notes (Signed)
[name] is a 50 y.o. female patient admitted from [place] awake, alert - oriented  X 4 - no acute distress noted.  VSS - Blood pressure 119/65, pulse 73, temperature 98.6 F (37 C), temperature source Oral, resp. rate 17, height 5\' 8"  (1.727 m), weight 110.678 kg (244 lb), SpO2 100 %.    IV in place, occlusive dsg intact without redness.  Orientation to room, and floor completed with information packet given to patient/family. Admission INP armband ID verified with patient/family, and in place.   SR up x 2, fall assessment complete, with patient and family able to verbalize understanding of risk associated with falls, and verbalized understanding to call nsg before up out of bed.  Call light within reach, patient able to voice, and demonstrate understanding.  Skin, clean-dry- intact without evidence of bruising, or skin tears.   No evidence of skin break down noted on exam.     Will cont to eval and treat per MD orders.  Deri Fuelling, RN 08/26/2015 2:03 PM

## 2015-08-26 NOTE — Progress Notes (Signed)
Ambulating in hallway without trouble Eating dinner and passing flatus Good pain relief  Filed Vitals:   08/26/15 1611 08/26/15 1700  BP: 124/61 120/66  Pulse: 70 68  Temp: 98.5 F (36.9 C) 98.6 F (37 C)  Resp: 20 18   Lungs CTA Cor RRR Abdomen soft, good BS  Results for orders placed or performed during the hospital encounter of 08/26/15 (from the past 24 hour(s))  ABO/Rh     Status: None   Collection Time: 08/26/15  9:21 AM  Result Value Ref Range   ABO/RH(D) A NEG   Type and screen [facility]     Status: None   Collection Time: 08/26/15 10:45 AM  Result Value Ref Range   ABO/RH(D) A NEG    Antibody Screen NEG    Sample Expiration 08/29/2015    Extend sample reason NO TRANSFUSIONS OR PREGNANCY IN THE PAST 3 MONTHS   CBC     Status: Abnormal   Collection Time: 08/26/15  5:32 PM  Result Value Ref Range   WBC 9.3 4.0 - 10.5 K/uL   RBC 3.89 3.87 - 5.11 MIL/uL   Hemoglobin 11.5 (L) 12.0 - 15.0 g/dL   HCT 34.2 (L) 36.0 - 46.0 %   MCV 87.9 78.0 - 100.0 fL   MCH 29.6 26.0 - 34.0 pg   MCHC 33.6 30.0 - 36.0 g/dL   RDW 14.7 11.5 - 15.5 %   Platelets 179 150 - 400 K/uL    A/P: Stable         D/W patient and husband surgery. D/W D/C in am         Instructions reviewed         Patient will resume meds at home. She has MS Contin and percocet at home         FU office 1-2 weeks

## 2015-08-26 NOTE — Anesthesia Procedure Notes (Signed)
Procedure Name: Intubation Date/Time: 08/26/2015 11:09 AM Performed by: Bethena Roys T Pre-anesthesia Checklist: Patient identified, Emergency Drugs available, Suction available and Patient being monitored Patient Re-evaluated:Patient Re-evaluated prior to inductionOxygen Delivery Method: Circle System Utilized Preoxygenation: Pre-oxygenation with 100% oxygen Intubation Type: IV induction Ventilation: Mask ventilation without difficulty Laryngoscope Size: Mac and 3 Grade View: Grade I Tube type: Oral Tube size: 7.0 mm Number of attempts: 1 Airway Equipment and Method: Stylet and Oral airway Placement Confirmation: ETT inserted through vocal cords under direct vision,  positive ETCO2 and breath sounds checked- equal and bilateral Secured at: 21 cm Tube secured with: Tape Dental Injury: Teeth and Oropharynx as per pre-operative assessment

## 2015-08-26 NOTE — Anesthesia Preprocedure Evaluation (Addendum)
Anesthesia Evaluation  Patient identified by MRN, date of birth, ID band Patient awake  General Assessment Comment:Breast cancer metastasized to multiple sites [facility])  liver, brain, and bone    Reviewed: Allergy & Precautions, NPO status , Patient's Chart, lab work & pertinent test results  Airway Mallampati: III  TM Distance: <3 FB Neck ROM: Full    Dental no notable dental hx.    Pulmonary neg pulmonary ROS, former smoker,    Pulmonary exam normal breath sounds clear to auscultation       Cardiovascular negative cardio ROS Normal cardiovascular exam Rhythm:Regular Rate:Normal     Neuro/Psych Chronic pain  negative psych ROS   GI/Hepatic negative GI ROS, Neg liver ROS,   Endo/Other  Morbid obesity  Renal/GU negative Renal ROS  negative genitourinary   Musculoskeletal negative musculoskeletal ROS (+)   Abdominal   Peds negative pediatric ROS (+)  Hematology Anticoagulated   Anesthesia Other Findings   Reproductive/Obstetrics negative OB ROS                            Anesthesia Physical Anesthesia Plan  ASA: III  Anesthesia Plan: General   Post-op Pain Management:    Induction: Intravenous  Airway Management Planned: Oral ETT  Additional Equipment:   Intra-op Plan:   Post-operative Plan: Extubation in OR  Informed Consent: I have reviewed the patients History and Physical, chart, labs and discussed the procedure including the risks, benefits and alternatives for the proposed anesthesia with the patient or authorized representative who has indicated his/her understanding and acceptance.   Dental advisory given  Plan Discussed with: CRNA and Surgeon  Anesthesia Plan Comments:         Anesthesia Quick Evaluation

## 2015-08-27 ENCOUNTER — Encounter (HOSPITAL_BASED_OUTPATIENT_CLINIC_OR_DEPARTMENT_OTHER): Payer: Self-pay | Admitting: Obstetrics and Gynecology

## 2015-08-27 ENCOUNTER — Other Ambulatory Visit (HOSPITAL_COMMUNITY): Payer: Self-pay | Admitting: Emergency Medicine

## 2015-08-27 ENCOUNTER — Ambulatory Visit (HOSPITAL_COMMUNITY): Payer: 59 | Admitting: Physical Therapy

## 2015-08-27 DIAGNOSIS — Z4002 Encounter for prophylactic removal of ovary: Secondary | ICD-10-CM | POA: Diagnosis not present

## 2015-08-27 MED ORDER — RIVAROXABAN 20 MG PO TABS
20.0000 mg | ORAL_TABLET | Freq: Every day | ORAL | Status: DC
Start: 2015-08-27 — End: 2016-08-19

## 2015-08-27 MED ORDER — HEPARIN SOD (PORK) LOCK FLUSH 100 UNIT/ML IV SOLN
500.0000 [IU] | INTRAVENOUS | Status: AC | PRN
  Administered 2015-08-27: 500 [IU]

## 2015-08-27 NOTE — Progress Notes (Signed)
Ambulating well, +flatus, voiding, good pain relief  VSS Afeb Abdomen soft, good BS  A/P: D/C home         Instructions reviewed

## 2015-08-27 NOTE — Discharge Summary (Signed)
Physician Discharge Summary  Patient ID: JIYA GROESCHEL MRN: [id number] DOB/AGE: 01-12-1966 50 y.o.  Admit date: 08/26/2015 Discharge date: 08/27/2015  Admission Diagnoses:Metastatic breast cancer, estrogen receptor positive  Discharge Diagnoses:  Active Problems:   Metastatic breast cancer [facility])   Discharged Condition: good  Hospital Course: after procedure observed. Patient had good resumption of bowel/bladder function, good pain relief, ambulating well.  Consults: None  Significant Diagnostic Studies: labs:  Results for orders placed or performed during the hospital encounter of 08/26/15 (from the past 24 hour(s))  ABO/Rh     Status: None   Collection Time: 08/26/15  9:21 AM  Result Value Ref Range   ABO/RH(D) A NEG   Type and screen [facility]     Status: None   Collection Time: 08/26/15 10:45 AM  Result Value Ref Range   ABO/RH(D) A NEG    Antibody Screen NEG    Sample Expiration 08/29/2015    Extend sample reason NO TRANSFUSIONS OR PREGNANCY IN THE PAST 3 MONTHS   CBC     Status: Abnormal   Collection Time: 08/26/15  5:32 PM  Result Value Ref Range   WBC 9.3 4.0 - 10.5 K/uL   RBC 3.89 3.87 - 5.11 MIL/uL   Hemoglobin 11.5 (L) 12.0 - 15.0 g/dL   HCT 34.2 (L) 36.0 - 46.0 %   MCV 87.9 78.0 - 100.0 fL   MCH 29.6 26.0 - 34.0 pg   MCHC 33.6 30.0 - 36.0 g/dL   RDW 14.7 11.5 - 15.5 %   Platelets 179 150 - 400 K/uL    Treatments: surgery: laparoscopic bilateral salpingo-oophorectomy  Discharge Exam: Blood pressure 107/57, pulse 54, temperature 97.9 F (36.6 C), temperature source Oral, resp. rate 16, height 5\' 8"  (1.727 m), weight 244 lb (110.678 kg), SpO2 99 %. General appearance: alert, cooperative and no distress GI: soft, non-tender; bowel sounds normal; no masses,  no organomegaly  Disposition: 01-Home or Self Care  Discharge Instructions    Discharge patient    Complete by:  As directed   Discharge patient to home @ 700 on 08/27/15     Discharge patient    Complete by:  As directed             Medication List    STOP taking these medications        prochlorperazine 10 MG tablet  Commonly known as:  COMPAZINE     XARELTO 20 MG Tabs tablet  Generic drug:  rivaroxaban      TAKE these medications        acetaminophen 325 MG tablet  Commonly known as:  TYLENOL  Take 650 mg by mouth every 6 (six) hours as needed for moderate pain or headache.     anastrozole 1 MG tablet  Commonly known as:  ARIMIDEX  Take 1 tablet (1 mg total) by mouth daily.     CALCIUM/D3 ADULT GUMMIES PO  Take 2 tablets by mouth 2 (two) times daily. dosage of calcium 1200mg  and vitamin d 1000mg      diphenhydrAMINE 25 mg capsule  Commonly known as:  BENADRYL  Take 25 mg by mouth every 6 (six) hours as needed for itching.     furosemide 20 MG tablet  Commonly known as:  LASIX  TAKE 2 TABLETS BY MOUTH EVERY DAY AS NEEDED     lidocaine-prilocaine cream  Commonly known as:  EMLA  Apply 1 application topically daily as needed (apply to port before chemo).  LORazepam 0.5 MG tablet  Commonly known as:  ATIVAN  Take 0.5 mg by mouth every 6 (six) hours as needed for anxiety. Reported on 08/16/2015     meclizine 25 MG tablet  Commonly known as:  ANTIVERT  Take 1 tablet (25 mg total) by mouth every 6 (six) hours as needed for dizziness.     methylphenidate 10 MG tablet  Commonly known as:  RITALIN  Take 2 in the morning and 1 at lunch     morphine 30 MG 12 hr tablet  Commonly known as:  MS CONTIN  Take 1 tablet (30 mg total) by mouth every 12 (twelve) hours.     ondansetron 8 MG tablet  Commonly known as:  ZOFRAN  Take 1 tablet (8 mg total) by mouth every 8 (eight) hours as needed for nausea or vomiting.     oxyCODONE 5 MG immediate release tablet  Commonly known as:  Oxy IR/ROXICODONE  Take 1-2 tablets (5-10 mg total) by mouth every 4 (four) hours as needed.     pantoprazole 40 MG tablet  Commonly known as:  PROTONIX  TAKE 1  TABLET BY MOUTH EVERY DAY     potassium chloride SA 20 MEQ tablet  Commonly known as:  K-DUR,KLOR-CON  Take 1 tablet (20 mEq total) by mouth 2 (two) times daily.     promethazine 25 MG tablet  Commonly known as:  PHENERGAN  Take 1 tablet (25 mg total) by mouth every 6 (six) hours as needed for nausea or vomiting.     SUMAtriptan 100 MG tablet  Commonly known as:  IMITREX  Take 1 tablet (100 mg total) by mouth as needed. foir migraine     venlafaxine XR 75 MG 24 hr capsule  Commonly known as:  EFFEXOR-XR  TAKE 2 CAPSULES BY MOUTH DAILY WITH BREAKFAST     zolpidem 10 MG tablet  Commonly known as:  AMBIEN  Take 1 tablet (10 mg total) by mouth at bedtime as needed.           Follow-up Information    Follow up In 1 week.      Signed: Benjiman Core E 08/27/2015, 8:53 AM

## 2015-08-27 NOTE — Discharge Instructions (Signed)
No vaginal entry No heavy lifting Resume Xarelto evening of 08/27/15

## 2015-08-27 NOTE — Anesthesia Postprocedure Evaluation (Signed)
Anesthesia Post Note  Patient: [name]  Procedure(s) Performed: Procedure(s) (LRB): LAPAROSCOPIC SALPINGO OOPHORECTOMY, bilateral (Bilateral)  Patient location during evaluation: PACU Anesthesia Type: General Level of consciousness: awake and alert Pain management: pain level controlled Vital Signs Assessment: post-procedure vital signs reviewed and stable Respiratory status: spontaneous breathing, nonlabored ventilation, respiratory function stable and patient connected to nasal cannula oxygen Cardiovascular status: blood pressure returned to baseline and stable Postop Assessment: no signs of nausea or vomiting Anesthetic complications: no    Last Vitals:  Filed Vitals:   08/27/15 0156 08/27/15 0501  BP: 105/55 107/57  Pulse: 63 54  Temp: 36.4 C 36.6 C  Resp: 16 16    Last Pain:  Filed Vitals:   08/27/15 1047  PainSc: 2                  Aaria Happ S

## 2015-08-27 NOTE — Op Note (Signed)
[name], [name]                ACCOUNT NO.:  [id number]  MEDICAL RECORD NO.:  [id number]  LOCATION:  [id number]                         FACILITY:  [facility]  PHYSICIAN:  Garland Gaetano Net, M.D. DATE OF BIRTH:  Jun 22, 1965  DATE OF PROCEDURE: DATE OF DISCHARGE:                              OPERATIVE REPORT   PREOPERATIVE DIAGNOSIS:  Estrogen receptor positive breast cancer.  POSTOPERATIVE DIAGNOSIS:  Estrogen receptor positive breast cancer.  PROCEDURE:  Laparoscopic bilateral salpingo-oophorectomy.  SURGEON:  Daleen Bo. Gaetano Net, M.D.  ANESTHESIA:  General with endotracheal intubation.  ESTIMATED BLOOD LOSS:  Drops.  SPECIMENS:  Fallopian tubes and ovaries, bilateral, both to Pathology.  INDICATIONS AND CONSENT:  This patient is a 50 year old, status post LAVH who has metastatic breast cancer.  It is estrogen receptor positive.  Her oncologist recommends BSO.  Ultrasound showed normal- appearing ovaries.  Laparoscopic bilateral salpingo-oophorectomy has been discussed with the patient preoperatively.  Potential risks and complications were reviewed preoperatively including, but not limited to, infection, organ damage, bleeding requiring transfusion of blood products with HIV and hepatitis acquisition, DVT, PE, pneumonia, laparotomy, return to the operating room, pelvic pain, abdominal pain, and painful intercourse.  The patient is also anticoagulated on Xarelto. She took her last dose of Xarelto on August 22, 2015.  All questions were answered.  The patient states she understands and agrees.  Consent was signed on the chart.  FINDINGS:  In the upper abdomen, there are some adhesions that remained over the right dome of the liver to the diaphragm.  Photographs are taken to be shared with the patient.  In the pelvis, she is status post hysterectomies.  Tubes and ovaries are normal.  DESCRIPTION OF PROCEDURE:  The patient was taken to the operating room where she was placed in the  dorsal supine position and her feet and legs were placed in the operative position in the stirrups before sedation to assure that this was not causing any discomfort.  The patient states she was comfortable.  She then undergoes general anesthesia via endotracheal intubation.  Time-out was undertaken.  She was prepped abdominally and vaginally.  Bladder was straight catheterized and a sponge on the stick was placed in the vagina.  She was draped in a sterile fashion.  The infraumbilical and suprapubic areas were injected with approximately 7 mL of 0.5% plain Marcaine.  An infraumbilical incision was made.  A disposable Veress needle was placed, but failed to give a proper syringe and drop test.  No blood was obtained.  Therefore, the Veress needle was abandoned.  The infraumbilical incision was extended slightly and the peritoneal cavity was entered in layers under good visualization and entered bluntly with a finger.  The fascia was marked at the 3 and 9 o'clock positions with 0 Vicryl under good visualization.  These sutures were held.  The disposable Hassan laparoscopic trocar sleeve was then placed.  The balloon was inflated and the pneumoperitoneum was created. Inspection with the operative laparoscope revealed findings as above.  A small suprapubic incision was made in the midline and a 5-mm disposable trocar sleeve was placed under direct visualization without difficulty. The right infundibulopelvic ligament was taken down and progressive  bites were done to free the entire ovary and fallopian tube.  This was placed in the cul-de-sac.  Similar procedure was carried out on the left side.  Meticulous care was taken to assure complete hemostasis.  Then, using the 5-mm laparoscope through the suprapubic trocar sleeve, the EndoCatch was used to the umbilical trocar sleeve to easily retrieve both ovaries in a single pouch.  Switching back to the operative laparoscope, careful inspection  revealed excellent hemostasis all around.  No additional cautery was used.  Quality specimen was removed. The pneumoperitoneum was reduced and therefore, reinspection again assured good hemostasis all around.  No additional cautery was needed. The remaining approximately 25 mL of 0.5% plain Marcaine was instilled into the peritoneal cavity.  Suprapubic trocar sleeve was removed and the umbilical trocar sleeve was removed, which reduced the pneumoperitoneum.  Under good visualization, the 0 Vicryl suture was used to close the fascia in a pursestring manner.  This tied down securely and digital inspection revealed excellent closure of the fascia.  The skin was then closed with interrupted 4-0 Vicryl on both incisions.  Dermabond was applied.  Sponge on the stick was removed from the vagina.  All counts were correct and the patient was awakened and taken to the recovery room in stable condition.     Daleen Bo Gaetano Net, M.D.     JET/MEDQ  D:  08/26/2015  T:  08/27/2015  Job:  [id number]

## 2015-08-27 NOTE — Progress Notes (Signed)
Assessment unchanged. Pt verbalized understanding of dc instructions through teach back including follow up care and when to call the doctor. No scripts at dc. Discharged via wc to front entrance accompanied by NT and husband.

## 2015-08-27 NOTE — Progress Notes (Signed)
VSS Afeb Will proceed with discharge

## 2015-08-29 ENCOUNTER — Ambulatory Visit (HOSPITAL_COMMUNITY): Payer: 59 | Admitting: Physical Therapy

## 2015-09-03 ENCOUNTER — Ambulatory Visit (HOSPITAL_COMMUNITY): Payer: 59 | Admitting: Physical Therapy

## 2015-09-04 NOTE — Progress Notes (Signed)
[place] [address]  Breast cancer, stage 4, unspecified laterality ([name]) - Plan: NM Cardiac Muga Rest, morphine (MS CONTIN) 30 MG 12 hr tablet, zolpidem (AMBIEN) 10 MG tablet  Bone metastases ([name]) - Plan: morphine (MS CONTIN) 30 MG 12 hr tablet, zolpidem (AMBIEN) 10 MG tablet  Chronic fatigue - Plan: methylphenidate (RITALIN) 10 MG tablet  Preventative health care - Plan: zolpidem (AMBIEN) 10 MG tablet  Gastroesophageal reflux disease, esophagitis presence not specified - Plan: pantoprazole (PROTONIX) 40 MG tablet  Depression - Plan: venlafaxine XR (EFFEXOR-XR) 75 MG 24 hr capsule  CURRENT THERAPY: Arimidex/Herceptin  INTERVAL HISTORY: [name] 50 y.o. female returns for followup of stage IV adenocarcinoma of the breast, ER+, HER 2 + disease.    Breast cancer, stage 4 ([name])   04/25/2014 Initial Diagnosis Breast cancer, stage 4   04/25/2014 Imaging CT abdomen/pelvia with widespread metastatic disease to the liver, multiple lytic lesions throughout spine and pelvis. No FX or epidural tumor identified   04/26/2014 Imaging CT head unremarkable   04/26/2014 Imaging CT chest with no lung mass or pulmonary nodules, no adenopathy. Lytic bone lesions, right 2nd rib   04/27/2014 Initial Biopsy U/S guided liver biopsy, lesion in anterior and inferior left hepatic lobe biopsied   04/27/2014 Pathology Results Metastatic adenocarcinoma, CK7, ER+, patchy positivity with PR. Possible primary includes breast, less likely gynecologic   05/15/2014 Mammogram BI-RADS CATEGORY  2: Benign Finding(s)   05/16/2014 PET scan 1. Intensely hypermetabolic hepatic metastasis. 2. Widespread hypermetabolic skeletal lesions. 3. No primary adenocarcinoma identified by FDG PET imaging.   05/19/2014 Imaging MUGA- Left ventricular ejection fracture greater than 70%.   05/21/2014 Breast MRI No suspicious masses or enhancement within the breasts. No axillary adenopathy.   05/22/2014 - 07/03/2014 Antibody Plan Herceptin/Perjeta/Tamoxifen   06/12/2014 - 07/03/2014 Chemotherapy Taxotere added secondary to persistent abdominal and back pain   06/17/2014 - 06/19/2014 [name] Admission Neutropenia, fever, diarrhea, nausea, vomiting   06/20/2014 - 07/10/2014 Radiation Therapy Dr. Thea [name] 12 fractions to L3-S3 (30 Gy) and left scapula (20 Gy).    07/03/2014 Adverse Reaction Perjeta- induced diarrhea.  Perjeta discontinued   07/16/2014 - 07/20/2014 [name] Admission Electrolyte abnormalities, and diarrhea.  Suspect Perjeta-induced diarrhea.  Negative GI work-up.   07/24/2014 -  Chemotherapy Herceptin/Tamoxifen/Xgeva   08/21/2014 Imaging MUGA- Left ventricular ejection fraction equals 71%.   08/24/2014 PET scan Dramatic reduction in metabolic activity of the widespread liver metastasis. Liver metastasis now have metabolic activity equal to background normal liver activity. Liver has a nodular contour. Marked reduction in metabolic activity of skeletal lesions..   10/05/2014 Progression Widespread metastatic disease to the brain as described. Between 20 and 30 intracranial metastatic deposits are now seen. No midline shift or incipient herniation   10/09/2014 - 10/26/2014 Radiation Therapy Whole Brain XRT   11/14/2014 Imaging MUGA- LVEF 67%   02/13/2015 Imaging MUGA- LVEF 59%   02/15/2015 Treatment Plan Change Due to declining LVEF, will hold Herceptin per PI guidelines.   04/12/2015 -  Chemotherapy Herceptin restarted   06/02/2015 - 06/08/2015 [name] Admission Pneumonia   07/05/2015 Progression  PET/CT concern for mild progression of skeletal metastasis with several lesions within the spine and 1 lesion in the Left iliac wing with mild to moderate metabolic activity new from prior. Rising CA 27-29   07/16/2015 Imaging MRI brain with satisfactory post treatment apperance of brain. interval resolved enhancing R caudate metastasis, minimal punctate residual enhancing  metastatic disease at the  inferior L cerebellum. No new metastatic disease or new intracranial abnormality   07/19/2015 Treatment Plan Change Discontinue Tamoxifen, Zoladex plus Arimidex.    08/27/2015 Procedure Laparoscopic bilateral salpingo-oophorectomy by Dr. Gaetano [name]    I personally reviewed and went over laboratory results with the patient.  The results are noted within this dictation.  I personally reviewed and went over radiographic studies with the patient.  The results are noted within this dictation. MUGA on 08/08/2015 demonstrates a LVEF of 56.5%.    She is doing well.  She is healing nicely post-operatively.  Her incision is healed overall.    She needs a refill on a number of medications which are addressed today.  She notes some fatigue, otherwise she denies any complaints.  She is going to [name] to visit her granddaughter in [name] this weekend. She is flying out of [name], [place].    Past Medical History  Diagnosis Date  . Depression   . Anxiety   . Family history of prostate cancer   . Drug-induced cardiomyopathy ([place])     per last MUGA (08-08-2015), ef 56.5/ per last echo 05-18-2014 ef 60-65%  . PONV (postoperative nausea and vomiting)     pt states scope patch does well  . History of DVT (deep vein thrombosis)     07-09-2014  upper right extremity-  RIJ and right subclavian--  resolved  . Chronic pain syndrome     secondary to cancer   . GERD (gastroesophageal reflux disease)   . History of colon polyps     07-13-2013  benign  . History of gastritis     erosive  . History of radiation therapy     12 fractions to L3 - S3, 30Gy and left spacula 20Gy (06-20-2014 to 07-10-2014) //  whole brain rxt (10-09-2014 to 10-26-2014)  . History of small bowel obstruction     S/P RESECTION 2008  . Breast cancer, stage 4 [name]) oncologist-  dr [name] ([facility])    dx 12/ 2015 -- breast cancer Stage 4,  ER/HER2 +,  w/  liver, brain and  bone mets/  chemotherapy and  radiation therapy  . Breast cancer metastasized to multiple sites [name])     liver, brain, and bone  . Migraine   . History of pneumonia     HCAP 06-07-2015--  resolved per cxr 07-04-2015  . Anticoagulated     xarelto    has Hyperlipidemia; Depression; Breast cancer, stage 4 ([place]); Bone metastases ([place]); DVT (deep venous thrombosis) ([place]); GERD (gastroesophageal reflux disease); Family history of prostate cancer; Genetic testing; Hot flashes due to tamoxifen; Brain metastases ([place]); Drug-induced cardiomyopathy ([place]); HCAP (healthcare-associated pneumonia); Pneumonia; Metastatic breast cancer ([place]); CAP (community acquired pneumonia); and Esophageal reflux on her problem list.     has No Known Allergies.  Current Outpatient Prescriptions on File Prior to Visit  Medication Sig Dispense Refill  . acetaminophen (TYLENOL) 325 MG tablet Take 650 mg by mouth every 6 (six) hours as needed for moderate pain or headache.    . anastrozole (ARIMIDEX) 1 MG tablet Take 1 tablet (1 mg total) by mouth daily. (Patient taking differently: Take 1 mg by mouth every morning. ) 30 tablet 11  . Calcium-Phosphorus-Vitamin D (CALCIUM/D3 ADULT GUMMIES PO) Take 2 tablets by mouth 2 (two) times daily. dosage of calcium 1274m and vitamin d 10020m   . diphenhydrAMINE (BENADRYL) 25 mg capsule Take 25 mg by mouth every 6 (six)  hours as needed for itching.    . furosemide (LASIX) 20 MG tablet TAKE 2 TABLETS BY MOUTH EVERY DAY AS NEEDED 60 tablet 1  . lidocaine-prilocaine (EMLA) cream Apply 1 application topically daily as needed (apply to port before chemo).     . LORazepam (ATIVAN) 0.5 MG tablet Take 0.5 mg by mouth every 6 (six) hours as needed for anxiety. Reported on 08/16/2015    . meclizine (ANTIVERT) 25 MG tablet Take 1 tablet (25 mg total) by mouth every 6 (six) hours as needed for dizziness. 30 tablet 1  . ondansetron (ZOFRAN) 8 MG tablet Take 1 tablet (8 mg total) by mouth every 8 (eight) hours as needed for nausea  or vomiting. 30 tablet 1  . oxyCODONE (OXY IR/ROXICODONE) 5 MG immediate release tablet Take 1-2 tablets (5-10 mg total) by mouth every 4 (four) hours as needed. (Patient taking differently: Take 5-10 mg by mouth every 4 (four) hours as needed for moderate pain. ) 60 tablet 0  . potassium chloride SA (K-DUR,KLOR-CON) 20 MEQ tablet Take 1 tablet (20 mEq total) by mouth 2 (two) times daily. (Patient taking differently: Take 20 mEq by mouth 3 (three) times a week. ) 60 tablet 1  . promethazine (PHENERGAN) 25 MG tablet Take 1 tablet (25 mg total) by mouth every 6 (six) hours as needed for nausea or vomiting. 30 tablet 1  . rivaroxaban (XARELTO) 20 MG TABS tablet Take 1 tablet (20 mg total) by mouth daily with supper. 30 tablet 5  . SUMAtriptan (IMITREX) 100 MG tablet Take 1 tablet (100 mg total) by mouth as needed. foir migraine 10 tablet 3   No current facility-administered medications on file prior to visit.    Past Surgical History  Procedure Laterality Date  . Breast reduction surgery  03/17/2011    Procedure: MAMMARY REDUCTION BILATERAL (BREAST);  Surgeon: Mary A Contogiannis;  Location: [facility];  Service: Plastics;  Laterality: Bilateral;  . Colonoscopy N/A 07/13/2013    Procedure: COLONOSCOPY;  Surgeon: Rogene Houston, MD;  [facility];  Service: Endoscopy;  Laterality: N/A;  930  . Portacath placement  05-17-2014  . Esophagogastroduodenoscopy N/A 05/25/2014    Procedure: ESOPHAGOGASTRODUODENOSCOPY (EGD);  Surgeon: Rogene Houston, MD;  [facility];  Service: Endoscopy;  Laterality: N/A;  155  . Colonoscopy N/A 11/26/2014    Procedure: COLONOSCOPY;  Surgeon: Rogene Houston, MD;  [facility];  Service: Endoscopy;  Laterality: N/A;  730  . Shoulder arthroscopy with rotator cuff repair Right 2002  . Cervical fusion  2003    C5 -- C6  . Knee arthroscopy Right 2005  . Laparoscopic cholecystectomy  11-17-2002  . Dx laparoscopy w/ partial small  bowel resection and appendectomy  04-13-2007  . Cataract extraction w/ intraocular lens  implant, bilateral  2008  . Laparoscopic assisted vaginal hysterectomy  10-13-2010    w/ Bx Left Fallopian tube and Aspiration Right Ovarian Cyst  . D & c hysteroscopy/  resection endometrial mass/  novasure endometrial ablation  04-11-2010  . Transthoracic echocardiogram  05-18-2014    ef 60-65%//   last MUGA  (08-08-2015)  ef 56.6%    . Laparoscopic salpingo oopherectomy Bilateral 08/26/2015    Procedure: LAPAROSCOPIC SALPINGO OOPHORECTOMY, bilateral;  Surgeon: Everlene Farrier, MD;  Location: [facility];  Service: Gynecology;  Laterality: Bilateral;    Denies any headaches, dizziness, double vision, fevers, chills, night sweats, nausea, vomiting, diarrhea, constipation, chest pain, heart palpitations,  shortness of breath, blood in stool, black tarry stool, urinary pain, urinary burning, urinary frequency, hematuria.   PHYSICAL EXAMINATION  ECOG PERFORMANCE STATUS: 1 - Symptomatic but completely ambulatory  Filed Vitals:   09/06/15 0800  BP: 112/58  Pulse: 103  Temp: 97.6 F (36.4 C)  Resp: 18    GENERAL:alert, no distress, well nourished, well developed, comfortable, cooperative, obese and smiling SKIN: skin color, texture, turgor are normal, no rashes or significant lesions HEAD: Normocephalic, No masses, lesions, tenderness or abnormalities EYES: normal, EOMI, Conjunctiva are pink and non-injected EARS: External ears normal OROPHARYNX:lips, buccal mucosa, and tongue normal and mucous membranes are moist  NECK: supple, trachea midline LYMPH:  no palpable lymphadenopathy BREAST:not examined LUNGS: clear to auscultation  HEART: regular rate & rhythm ABDOMEN:abdomen soft, non-tender, obese and normal bowel sounds.  Umbilical surgical site is well healed without any indication of infection without any erythema or discharge.  Site is nearly healed. BACK: Back symmetric, no  curvature. EXTREMITIES:less then 2 second capillary refill, no joint deformities, effusion, or inflammation, no skin discoloration  NEURO: alert & oriented x 3 with fluent speech, no focal motor/sensory deficits, gait normal   LABORATORY DATA: CBC    Component Value Date/Time   WBC 9.3 08/26/2015 1732   RBC 3.89 08/26/2015 1732   HGB 11.5* 08/26/2015 1732   HCT 34.2* 08/26/2015 1732   PLT 179 08/26/2015 1732   MCV 87.9 08/26/2015 1732   MCH 29.6 08/26/2015 1732   MCHC 33.6 08/26/2015 1732   RDW 14.7 08/26/2015 1732   LYMPHSABS 0.7 08/08/2015 1135   MONOABS 0.4 08/08/2015 1135   EOSABS 0.1 08/08/2015 1135   BASOSABS 0.0 08/08/2015 1135      Chemistry      Component Value Date/Time   NA 141 08/22/2015 1033   K 3.5 08/22/2015 1033   CL 103 08/22/2015 1033   CO2 29 08/22/2015 1033   BUN 11 08/22/2015 1033   CREATININE 0.86 08/22/2015 1033      Component Value Date/Time   CALCIUM 9.1 08/22/2015 1033   ALKPHOS 54 08/22/2015 1033   AST 20 08/22/2015 1033   ALT 20 08/22/2015 1033   BILITOT 0.1* 08/22/2015 1033        PENDING LABS:   RADIOGRAPHIC STUDIES:  Nm Cardiac Muga Rest  08/08/2015  CLINICAL DATA:  Patient to receive cardiotoxic chemotherapy. EXAM: NUCLEAR MEDICINE CARDIAC BLOOD POOL IMAGING (MUGA) TECHNIQUE: Cardiac multi-gated acquisition was performed at rest following intravenous injection of Tc-61mlabeled red blood cells. RADIOPHARMACEUTICALS:  Twenty-seven mCi Tc-957mDP in-vitro labeled red blood cells IV COMPARISON:  05/08/2015, and 05/18/2014. FINDINGS: The left ventricular ejection fraction is calculated to equal 56.5%. On 05/08/2015 the left ventricular ejection fraction equaled 58%. On the previous exam from 05/18/2014 the left ventricular ejection fraction was equal to 70%. IMPRESSION: 1. The left ventricular ejection fraction currently equals 56.5%. This is not significantly changed from 05/08/2015. However, this has decreased by 19% when compared with  05/18/2014 when it equaled 70%. Electronically Signed   By: TaKerby Moors.D.   On: 08/08/2015 15:58   Mr Shoulder Left Wo Contrast  08/07/2015  CLINICAL DATA:  Left shoulder injury 4 months ago while getting out of [place]. History of metastatic breast cancer. EXAM: MRI OF THE LEFT SHOULDER WITHOUT CONTRAST TECHNIQUE: Multiplanar, multisequence MR imaging of the shoulder was performed. No intravenous contrast was administered. COMPARISON:  Chest CT 06/02/2015 and PET-CT 03/29/2015. Prior MRI examination of the left shoulder 06/28/2014. FINDINGS: Rotator cuff: Moderate  at rotator cuff tendinopathy/tendinosis mainly involving the supraspinatus tendon. Shallow articular surface tear noted but no full-thickness retracted tear. The infraspinatus and subscapularis tendons are intact. Muscles:  Normal. Biceps long head:  Intact. Acromioclavicular Joint: Mild AC joint degenerative changes with some reactive marrow edema. The acromion is flat or type 1 and shape. No lateral downsloping or undersurface spurring. Glenohumeral Joint: No significant degenerative changes. There is a small joint effusion and mild synovitis or adhesive capsulitis. [name]: Moderate labral degenerative changes with probable small posterior labral tear. Bones: Ununited pathologic fracture of the scapula near the base of the acromion. The previous MRI short of bone lesion with subsequent fracture seen on prior CT scans. Fluid-filled gap between the fracture fragments is likely adventitial bursa formation. This could be the source patient shoulder pain. No new bone lesions. IMPRESSION: 1. Moderate rotator cuff tendinopathy/tendinosis with a shallow articular surface tear involving the supraspinatus tendon. 2. Intact long head biceps tendon. 3. Moderate labral degenerative changes with small posterior labral tears. 4. Ununited pathologic fracture of the scapula near the base of the acromion. No findings to suggest a persistent bone lesion. Fluid  between the fracture fragments is likely adventitial bursal formation. Electronically Signed   By: Marijo Sanes M.D.   On: 08/07/2015 11:49     PATHOLOGY:    ASSESSMENT AND PLAN:  Breast cancer, stage 4 ([name]) Stage IV adenocarcinoma of the breast, ER+, HER 2 + disease.  S/P BSO by Dr. Gaetano [name] on 08/27/2015.  Oncology history is updated.  Herceptin and Xgeva due today.  Zoledex was given on 08/16/2015 and since having BSO, plan is discontinued.  Next MUGA is due at the end of June 2017.  Order is placed.  I have refilled the following medications:  MS Contin  Effexor  Ritalin  Protonix  Ambien  She is going to [place] tomorrow to visit her granddaughter in [place].  She is to continue with Arimidex. We'll continue with Herceptin.  Return in 3 weeks for follow-up.    THERAPY PLAN:  Continue treatment as planned.  All questions were answered. The patient knows to call the clinic with any problems, questions or concerns. We can certainly see the patient much sooner if necessary.  Patient and plan discussed with Dr. Ancil Linsey and she is in agreement with the aforementioned.   This note is electronically signed by: Doy Mince 09/06/2015 8:34 AM

## 2015-09-04 NOTE — Assessment & Plan Note (Addendum)
Stage IV adenocarcinoma of the breast, ER+, HER 2 + disease.  S/P BSO by Dr. Gaetano Net on 08/27/2015.  Oncology history is updated.  Herceptin and Xgeva due today.  Zoledex was given on 08/16/2015 and since having BSO, plan is discontinued.  Next MUGA is due at the end of June 2017.  Order is placed.  I have refilled the following medications:  MS Contin  Effexor  Ritalin  Protonix  Ambien  She is going to [place] tomorrow to visit her granddaughter in [place].  She is to continue with Arimidex. We'll continue with Herceptin.  Return in 3 weeks for follow-up.

## 2015-09-05 ENCOUNTER — Ambulatory Visit (HOSPITAL_COMMUNITY): Payer: 59 | Admitting: Physical Therapy

## 2015-09-06 ENCOUNTER — Encounter (HOSPITAL_COMMUNITY): Payer: 59

## 2015-09-06 ENCOUNTER — Other Ambulatory Visit (HOSPITAL_COMMUNITY): Payer: Self-pay | Admitting: Oncology

## 2015-09-06 ENCOUNTER — Encounter (HOSPITAL_BASED_OUTPATIENT_CLINIC_OR_DEPARTMENT_OTHER): Payer: 59 | Admitting: Oncology

## 2015-09-06 ENCOUNTER — Encounter (HOSPITAL_BASED_OUTPATIENT_CLINIC_OR_DEPARTMENT_OTHER): Payer: 59

## 2015-09-06 ENCOUNTER — Encounter (HOSPITAL_COMMUNITY): Payer: Self-pay | Admitting: Oncology

## 2015-09-06 VITALS — BP 112/58 | HR 103 | Temp 97.6°F | Resp 18 | Wt 243.4 lb

## 2015-09-06 VITALS — BP 120/62 | HR 70 | Temp 98.7°F | Resp 18

## 2015-09-06 DIAGNOSIS — K219 Gastro-esophageal reflux disease without esophagitis: Secondary | ICD-10-CM

## 2015-09-06 DIAGNOSIS — E876 Hypokalemia: Secondary | ICD-10-CM

## 2015-09-06 DIAGNOSIS — Z5112 Encounter for antineoplastic immunotherapy: Secondary | ICD-10-CM | POA: Diagnosis not present

## 2015-09-06 DIAGNOSIS — F329 Major depressive disorder, single episode, unspecified: Secondary | ICD-10-CM

## 2015-09-06 DIAGNOSIS — C229 Malignant neoplasm of liver, not specified as primary or secondary: Secondary | ICD-10-CM | POA: Diagnosis not present

## 2015-09-06 DIAGNOSIS — C7951 Secondary malignant neoplasm of bone: Secondary | ICD-10-CM | POA: Diagnosis not present

## 2015-09-06 DIAGNOSIS — C50919 Malignant neoplasm of unspecified site of unspecified female breast: Secondary | ICD-10-CM

## 2015-09-06 DIAGNOSIS — Z0000 Encounter for general adult medical examination without abnormal findings: Secondary | ICD-10-CM

## 2015-09-06 DIAGNOSIS — F32A Depression, unspecified: Secondary | ICD-10-CM

## 2015-09-06 DIAGNOSIS — R5382 Chronic fatigue, unspecified: Secondary | ICD-10-CM

## 2015-09-06 LAB — COMPREHENSIVE METABOLIC PANEL
ALT: 18 U/L (ref 14–54)
AST: 18 U/L (ref 15–41)
Albumin: 3.5 g/dL (ref 3.5–5.0)
Alkaline Phosphatase: 53 U/L (ref 38–126)
Anion gap: 8 (ref 5–15)
BUN: 11 mg/dL (ref 6–20)
CHLORIDE: 104 mmol/L (ref 101–111)
CO2: 28 mmol/L (ref 22–32)
Calcium: 9 mg/dL (ref 8.9–10.3)
Creatinine, Ser: 0.88 mg/dL (ref 0.44–1.00)
Glucose, Bld: 132 mg/dL — ABNORMAL HIGH (ref 65–99)
POTASSIUM: 3.2 mmol/L — AB (ref 3.5–5.1)
SODIUM: 140 mmol/L (ref 135–145)
Total Bilirubin: 0.3 mg/dL (ref 0.3–1.2)
Total Protein: 6.5 g/dL (ref 6.5–8.1)

## 2015-09-06 LAB — CBC WITH DIFFERENTIAL/PLATELET
BASOS ABS: 0 10*3/uL (ref 0.0–0.1)
Basophils Relative: 0 %
EOS ABS: 0.1 10*3/uL (ref 0.0–0.7)
EOS PCT: 2 %
HCT: 36.5 % (ref 36.0–46.0)
Hemoglobin: 12 g/dL (ref 12.0–15.0)
Lymphocytes Relative: 15 %
Lymphs Abs: 1 10*3/uL (ref 0.7–4.0)
MCH: 29.3 pg (ref 26.0–34.0)
MCHC: 32.9 g/dL (ref 30.0–36.0)
MCV: 89.2 fL (ref 78.0–100.0)
MONO ABS: 0.6 10*3/uL (ref 0.1–1.0)
Monocytes Relative: 9 %
NEUTROS PCT: 74 %
Neutro Abs: 4.7 10*3/uL (ref 1.7–7.7)
PLATELETS: 225 10*3/uL (ref 150–400)
RBC: 4.09 MIL/uL (ref 3.87–5.11)
RDW: 14.6 % (ref 11.5–15.5)
WBC: 6.3 10*3/uL (ref 4.0–10.5)

## 2015-09-06 MED ORDER — POTASSIUM CHLORIDE CRYS ER 20 MEQ PO TBCR: 20.0000 meq | EXTENDED_RELEASE_TABLET | Freq: Two times a day (BID) | ORAL | Status: DC

## 2015-09-06 MED ORDER — SODIUM CHLORIDE 0.9 % IV SOLN
Freq: Once | INTRAVENOUS | Status: AC
  Administered 2015-09-06: 09:00:00 via INTRAVENOUS

## 2015-09-06 MED ORDER — HEPARIN SOD (PORK) LOCK FLUSH 100 UNIT/ML IV SOLN
500.0000 [IU] | Freq: Once | INTRAVENOUS | Status: AC | PRN
  Filled 2015-09-06: qty 5

## 2015-09-06 MED ORDER — TRASTUZUMAB CHEMO INJECTION 440 MG
6.0000 mg/kg | Freq: Once | INTRAVENOUS | Status: AC
  Administered 2015-09-06: 672 mg via INTRAVENOUS
  Filled 2015-09-06: qty 32

## 2015-09-06 MED ORDER — PANTOPRAZOLE SODIUM 40 MG PO TBEC: 40.0000 mg | DELAYED_RELEASE_TABLET | Freq: Every day | ORAL | Status: DC

## 2015-09-06 MED ORDER — ACETAMINOPHEN 325 MG PO TABS
650.0000 mg | ORAL_TABLET | Freq: Once | ORAL | Status: AC
  Administered 2015-09-06: 650 mg via ORAL
  Filled 2015-09-06: qty 2

## 2015-09-06 MED ORDER — METHYLPHENIDATE HCL 10 MG PO TABS: ORAL_TABLET | ORAL | Status: DC

## 2015-09-06 MED ORDER — DENOSUMAB 120 MG/1.7ML ~~LOC~~ SOLN
120.0000 mg | Freq: Once | SUBCUTANEOUS | Status: AC
  Administered 2015-09-06: 120 mg via SUBCUTANEOUS
  Filled 2015-09-06: qty 1.7

## 2015-09-06 MED ORDER — MORPHINE SULFATE ER 30 MG PO TBCR: 30.0000 mg | EXTENDED_RELEASE_TABLET | Freq: Two times a day (BID) | ORAL | Status: DC

## 2015-09-06 MED ORDER — DIPHENHYDRAMINE HCL 25 MG PO CAPS
50.0000 mg | ORAL_CAPSULE | Freq: Once | ORAL | Status: AC
  Administered 2015-09-06: 50 mg via ORAL
  Filled 2015-09-06: qty 2

## 2015-09-06 MED ORDER — SODIUM CHLORIDE 0.9 % IJ SOLN
10.0000 mL | INTRAMUSCULAR | Status: DC | PRN
  Administered 2015-09-06: 10 mL
  Filled 2015-09-06: qty 10

## 2015-09-06 MED ORDER — ZOLPIDEM TARTRATE 10 MG PO TABS: 10.0000 mg | ORAL_TABLET | Freq: Every evening | ORAL | Status: DC | PRN

## 2015-09-06 MED ORDER — VENLAFAXINE HCL ER 75 MG PO CP24: ORAL_CAPSULE | ORAL | Status: DC

## 2015-09-06 NOTE — Patient Instructions (Signed)
[place] at [facility] Discharge Instructions  RECOMMENDATIONS MADE BY THE CONSULTANT AND ANY TEST RESULTS WILL BE SENT TO YOUR REFERRING PHYSICIAN.  Exam done and seen today by Kirby Crigler Treatment given today. Arimidex daily Medications refilled Muga scan in June Return to see the Doctor in 3 weeks for follow up and herceptin with Dr.Penland Call the clinic for any concerns or questions.  Thank you for choosing [place] at [facility] to provide your oncology and hematology care.  To afford each patient quality time with our provider, please arrive at least 15 minutes before your scheduled appointment time.   Beginning January 23rd 2017 lab work for the [facility] will be done in the  Main lab at [facility] on 1st floor. If you have a lab appointment with the [facility] please come in thru the  Main Entrance and check in at the main information desk  You need to re-schedule your appointment should you arrive 10 or more minutes late.  We strive to give you quality time with our providers, and arriving late affects you and other patients whose appointments are after yours.  Also, if you no show three or more times for appointments you may be dismissed from the clinic at the providers discretion.     Again, thank you for choosing [facility].  Our hope is that these requests will decrease the amount of time that you wait before being seen by our physicians.       _____________________________________________________________  Should you have questions after your visit to [facility], please contact our office at [phone] between the hours of 8:30 a.m. and 4:30 p.m.  Voicemails left after 4:30 p.m. will not be returned until the following business day.  For prescription refill requests, have your pharmacy contact our office.         Resources For Cancer Patients and their Caregivers ? American  Cancer Society: Can assist with transportation, wigs, general needs, runs Look Good Feel Better.        639 235 4455 ? Cancer Care: Provides financial assistance, online support groups, medication/co-pay assistance.  1-800-813-HOPE 3082758523) ? [place] Assists [place] cancer patients and their families through emotional , educational and financial support.  (865)874-1148 ? [place] DSS Where to apply for food stamps, Medicaid and utility assistance. (334)466-1608 ? RCATS: Transportation to medical appointments. (425) 647-5073 ? Social Security Administration: May apply for disability if have a Stage IV cancer. ([phone] 206-502-7776 ? [employer], Disability and Transit Services: Assists with nutrition, care and transit needs. 2813976348

## 2015-09-06 NOTE — Patient Instructions (Signed)
[facility] Discharge Instructions for Patients Receiving Chemotherapy   Beginning January 23rd 2017 lab work for the [facility] will be done in the  Main lab at [facility] on 1st floor. If you have a lab appointment with the [facility] please come in thru the  Main Entrance and check in at the main information desk   Today you received the following chemotherapy agents Herceptin and xgeva  If you develop nausea and vomiting, or diarrhea that is not controlled by your medication, call the clinic.  The clinic phone number is [phone]. Office hours are Monday-Friday 8:30am-5:00pm.  BELOW ARE SYMPTOMS THAT SHOULD BE REPORTED IMMEDIATELY:  *FEVER GREATER THAN 101.0 F  *CHILLS WITH OR WITHOUT FEVER  NAUSEA AND VOMITING THAT IS NOT CONTROLLED WITH YOUR NAUSEA MEDICATION  *UNUSUAL SHORTNESS OF BREATH  *UNUSUAL BRUISING OR BLEEDING  TENDERNESS IN MOUTH AND THROAT WITH OR WITHOUT PRESENCE OF ULCERS  *URINARY PROBLEMS  *BOWEL PROBLEMS  UNUSUAL RASH Items with * indicate a potential emergency and should be followed up as soon as possible. If you have an emergency after office hours please contact your primary care physician or go to the nearest emergency department.  Please call the clinic during office hours if you have any questions or concerns.   You may also contact the Patient Navigator at [phone] should you have any questions or need assistance in obtaining follow up care.      Resources For Cancer Patients and their Caregivers ? American Cancer Society: Can assist with transportation, wigs, general needs, runs Look Good Feel Better.        323-808-1729 ? Cancer Care: Provides financial assistance, online support groups, medication/co-pay assistance.  1-800-813-HOPE 970-696-9675) ? [facility] Assists [place] cancer patients and their families through emotional , educational and financial support.   (951)246-7054 ? [place] DSS Where to apply for food stamps, Medicaid and utility assistance. 952-224-5611 ? RCATS: Transportation to medical appointments. (620)885-7292 ? Social Security Administration: May apply for disability if have a Stage IV cancer. 7605334353 (413) [phone] ? [employer], Disability and Transit Services: Assists with nutrition, care and transit needs. 808-700-9897

## 2015-09-06 NOTE — Progress Notes (Signed)
Tolerated well

## 2015-09-06 NOTE — Progress Notes (Signed)
[name] presents today for injection and chemo per the provider's orders.  Herceptin and xgeva administration without incident; see MAR for injection details.  Patient tolerated procedure well and without incident.  No questions or complaints noted at this time.Tolerated well

## 2015-09-07 LAB — CANCER ANTIGEN 27.29: CA 27.29: 24.1 U/mL (ref 0.0–38.6)

## 2015-09-15 ENCOUNTER — Encounter (HOSPITAL_COMMUNITY): Payer: Self-pay | Admitting: Hematology & Oncology

## 2015-09-16 ENCOUNTER — Other Ambulatory Visit (HOSPITAL_COMMUNITY): Payer: Self-pay | Admitting: *Deleted

## 2015-09-16 ENCOUNTER — Other Ambulatory Visit (HOSPITAL_COMMUNITY): Payer: Self-pay | Admitting: Oncology

## 2015-09-16 ENCOUNTER — Ambulatory Visit (HOSPITAL_COMMUNITY)
Admission: RE | Admit: 2015-09-16 | Discharge: 2015-09-16 | Disposition: A | Discharge status: Home / Self Care | Payer: 59 | Source: Ambulatory Visit | Attending: Oncology | Admitting: Oncology

## 2015-09-16 DIAGNOSIS — C50919 Malignant neoplasm of unspecified site of unspecified female breast: Secondary | ICD-10-CM | POA: Diagnosis present

## 2015-09-16 DIAGNOSIS — R05 Cough: Secondary | ICD-10-CM

## 2015-09-16 DIAGNOSIS — R053 Chronic cough: Secondary | ICD-10-CM | POA: Insufficient documentation

## 2015-09-16 DIAGNOSIS — J988 Other specified respiratory disorders: Secondary | ICD-10-CM

## 2015-09-16 MED ORDER — LEVOFLOXACIN 500 MG PO TABS: 500.0000 mg | ORAL_TABLET | Freq: Every day | ORAL | Status: DC

## 2015-09-18 ENCOUNTER — Other Ambulatory Visit (HOSPITAL_COMMUNITY): Payer: Self-pay | Admitting: Oncology

## 2015-09-18 DIAGNOSIS — C50919 Malignant neoplasm of unspecified site of unspecified female breast: Secondary | ICD-10-CM

## 2015-09-27 ENCOUNTER — Inpatient Hospital Stay (HOSPITAL_COMMUNITY): Payer: 59

## 2015-09-27 ENCOUNTER — Ambulatory Visit (HOSPITAL_COMMUNITY): Payer: 59 | Admitting: Hematology & Oncology

## 2015-10-04 ENCOUNTER — Encounter (HOSPITAL_BASED_OUTPATIENT_CLINIC_OR_DEPARTMENT_OTHER): Payer: 59

## 2015-10-04 ENCOUNTER — Ambulatory Visit (HOSPITAL_COMMUNITY): Payer: 59 | Admitting: Hematology & Oncology

## 2015-10-04 ENCOUNTER — Other Ambulatory Visit (HOSPITAL_COMMUNITY): Payer: 59

## 2015-10-04 ENCOUNTER — Encounter (HOSPITAL_COMMUNITY): Payer: 59 | Attending: Hematology & Oncology

## 2015-10-04 ENCOUNTER — Encounter (HOSPITAL_COMMUNITY): Payer: Self-pay | Admitting: Hematology & Oncology

## 2015-10-04 ENCOUNTER — Encounter (HOSPITAL_BASED_OUTPATIENT_CLINIC_OR_DEPARTMENT_OTHER): Payer: 59 | Admitting: Hematology & Oncology

## 2015-10-04 VITALS — BP 114/68 | HR 71 | Temp 97.9°F | Resp 16

## 2015-10-04 VITALS — BP 107/64 | HR 91 | Temp 97.9°F | Resp 16 | Wt 243.8 lb

## 2015-10-04 DIAGNOSIS — C7951 Secondary malignant neoplasm of bone: Secondary | ICD-10-CM | POA: Insufficient documentation

## 2015-10-04 DIAGNOSIS — C229 Malignant neoplasm of liver, not specified as primary or secondary: Secondary | ICD-10-CM | POA: Diagnosis present

## 2015-10-04 DIAGNOSIS — C50919 Malignant neoplasm of unspecified site of unspecified female breast: Secondary | ICD-10-CM | POA: Diagnosis not present

## 2015-10-04 DIAGNOSIS — R530 Neoplastic (malignant) related fatigue: Secondary | ICD-10-CM

## 2015-10-04 DIAGNOSIS — I82C11 Acute embolism and thrombosis of right internal jugular vein: Secondary | ICD-10-CM

## 2015-10-04 DIAGNOSIS — F419 Anxiety disorder, unspecified: Secondary | ICD-10-CM

## 2015-10-04 DIAGNOSIS — C7931 Secondary malignant neoplasm of brain: Secondary | ICD-10-CM

## 2015-10-04 DIAGNOSIS — Z5112 Encounter for antineoplastic immunotherapy: Secondary | ICD-10-CM

## 2015-10-04 DIAGNOSIS — M25512 Pain in left shoulder: Secondary | ICD-10-CM

## 2015-10-04 DIAGNOSIS — I82B11 Acute embolism and thrombosis of right subclavian vein: Secondary | ICD-10-CM

## 2015-10-04 DIAGNOSIS — I82621 Acute embolism and thrombosis of deep veins of right upper extremity: Secondary | ICD-10-CM

## 2015-10-04 DIAGNOSIS — G4700 Insomnia, unspecified: Secondary | ICD-10-CM

## 2015-10-04 LAB — COMPREHENSIVE METABOLIC PANEL
ALT: 18 U/L (ref 14–54)
ANION GAP: 8 (ref 5–15)
AST: 22 U/L (ref 15–41)
Albumin: 3.6 g/dL (ref 3.5–5.0)
Alkaline Phosphatase: 59 U/L (ref 38–126)
BUN: 11 mg/dL (ref 6–20)
CHLORIDE: 100 mmol/L — AB (ref 101–111)
CO2: 29 mmol/L (ref 22–32)
Calcium: 8.9 mg/dL (ref 8.9–10.3)
Creatinine, Ser: 0.69 mg/dL (ref 0.44–1.00)
Glucose, Bld: 134 mg/dL — ABNORMAL HIGH (ref 65–99)
Potassium: 3.5 mmol/L (ref 3.5–5.1)
Sodium: 137 mmol/L (ref 135–145)
TOTAL PROTEIN: 7 g/dL (ref 6.5–8.1)
Total Bilirubin: 0.2 mg/dL — ABNORMAL LOW (ref 0.3–1.2)

## 2015-10-04 LAB — CBC WITH DIFFERENTIAL/PLATELET
BASOS ABS: 0 10*3/uL (ref 0.0–0.1)
Basophils Relative: 0 %
EOS PCT: 2 %
Eosinophils Absolute: 0.1 10*3/uL (ref 0.0–0.7)
HCT: 38.8 % (ref 36.0–46.0)
Hemoglobin: 12.4 g/dL (ref 12.0–15.0)
LYMPHS PCT: 17 %
Lymphs Abs: 1 10*3/uL (ref 0.7–4.0)
MCH: 29 pg (ref 26.0–34.0)
MCHC: 32 g/dL (ref 30.0–36.0)
MCV: 90.9 fL (ref 78.0–100.0)
MONO ABS: 0.4 10*3/uL (ref 0.1–1.0)
MONOS PCT: 6 %
Neutro Abs: 4.6 10*3/uL (ref 1.7–7.7)
Neutrophils Relative %: 75 %
Platelets: 249 10*3/uL (ref 150–400)
RBC: 4.27 MIL/uL (ref 3.87–5.11)
RDW: 14.4 % (ref 11.5–15.5)
WBC: 6.1 10*3/uL (ref 4.0–10.5)

## 2015-10-04 MED ORDER — SODIUM CHLORIDE 0.9 % IJ SOLN: 10.0000 mL | INTRAMUSCULAR | Status: DC | PRN

## 2015-10-04 MED ORDER — SODIUM CHLORIDE 0.9 % IV SOLN
Freq: Once | INTRAVENOUS | Status: AC
  Administered 2015-10-04: 10:00:00 via INTRAVENOUS

## 2015-10-04 MED ORDER — ACETAMINOPHEN 325 MG PO TABS
650.0000 mg | ORAL_TABLET | Freq: Once | ORAL | Status: AC
  Administered 2015-10-04: 650 mg via ORAL

## 2015-10-04 MED ORDER — DENOSUMAB 120 MG/1.7ML ~~LOC~~ SOLN
120.0000 mg | Freq: Once | SUBCUTANEOUS | Status: AC
  Administered 2015-10-04: 120 mg via SUBCUTANEOUS
  Filled 2015-10-04: qty 1.7

## 2015-10-04 MED ORDER — ACETAMINOPHEN 325 MG PO TABS
ORAL_TABLET | ORAL | Status: AC
  Filled 2015-10-04: qty 2

## 2015-10-04 MED ORDER — MORPHINE SULFATE ER 30 MG PO TBCR: 30.0000 mg | EXTENDED_RELEASE_TABLET | Freq: Two times a day (BID) | ORAL | Status: DC

## 2015-10-04 MED ORDER — HEPARIN SOD (PORK) LOCK FLUSH 100 UNIT/ML IV SOLN
INTRAVENOUS | Status: AC
  Filled 2015-10-04: qty 5

## 2015-10-04 MED ORDER — DIPHENHYDRAMINE HCL 25 MG PO CAPS
ORAL_CAPSULE | ORAL | Status: AC
  Filled 2015-10-04: qty 2

## 2015-10-04 MED ORDER — HEPARIN SOD (PORK) LOCK FLUSH 100 UNIT/ML IV SOLN
500.0000 [IU] | Freq: Once | INTRAVENOUS | Status: AC | PRN
  Administered 2015-10-04: 500 [IU]

## 2015-10-04 MED ORDER — TRASTUZUMAB CHEMO INJECTION 440 MG
6.0000 mg/kg | Freq: Once | INTRAVENOUS | Status: AC
  Administered 2015-10-04: 672 mg via INTRAVENOUS
  Filled 2015-10-04: qty 32

## 2015-10-04 MED ORDER — DIPHENHYDRAMINE HCL 25 MG PO CAPS
50.0000 mg | ORAL_CAPSULE | Freq: Once | ORAL | Status: AC
  Administered 2015-10-04: 50 mg via ORAL

## 2015-10-04 NOTE — Progress Notes (Signed)
[place] [facility] [place], MD [place] [address]    Breast cancer, stage 4 [facility])   04/25/2014 Initial Diagnosis Breast cancer, stage 4   04/25/2014 Imaging CT abdomen/pelvia with widespread metastatic disease to the liver, multiple lytic lesions throughout spine and pelvis. No FX or epidural tumor identified   04/26/2014 Imaging CT head unremarkable   04/26/2014 Imaging CT chest with no lung mass or pulmonary nodules, no adenopathy. Lytic bone lesions, right 2nd rib   04/27/2014 Initial Biopsy U/S guided liver biopsy, lesion in anterior and inferior left hepatic lobe biopsied   04/27/2014 Pathology Results Metastatic adenocarcinoma, CK7, ER+, patchy positivity with PR. Possible primary includes breast, less likely gynecologic   05/15/2014 Mammogram BI-RADS CATEGORY  2: Benign Finding(s)   05/16/2014 PET scan 1. Intensely hypermetabolic hepatic metastasis. 2. Widespread hypermetabolic skeletal lesions. 3. No primary adenocarcinoma identified by FDG PET imaging.   05/19/2014 Imaging MUGA- Left ventricular ejection fracture greater than 70%.   05/21/2014 Breast MRI No suspicious masses or enhancement within the breasts. No axillary adenopathy.   05/22/2014 - 07/03/2014 Antibody Plan Herceptin/Perjeta/Tamoxifen   06/12/2014 - 07/03/2014 Chemotherapy Taxotere added secondary to persistent abdominal and back pain   06/17/2014 - 06/19/2014 Hospital Admission Neutropenia, fever, diarrhea, nausea, vomiting   06/20/2014 - 07/10/2014 Radiation Therapy Dr. Thea Silversmith 12 fractions to L3-S3 (30 Gy) and left scapula (20 Gy).    07/03/2014 Adverse Reaction Perjeta- induced diarrhea.  Perjeta discontinued   07/16/2014 - 07/20/2014 Hospital Admission Electrolyte abnormalities, and diarrhea.  Suspect Perjeta-induced diarrhea.  Negative GI work-up.   07/24/2014 -  Chemotherapy Herceptin/Tamoxifen/Xgeva   08/21/2014 Imaging MUGA- Left ventricular ejection fraction  equals 71%.   08/24/2014 PET scan Dramatic reduction in metabolic activity of the widespread liver metastasis. Liver metastasis now have metabolic activity equal to background normal liver activity. Liver has a nodular contour. Marked reduction in metabolic activity of skeletal lesions..   10/05/2014 Progression Widespread metastatic disease to the brain as described. Between 20 and 30 intracranial metastatic deposits are now seen. No midline shift or incipient herniation   10/09/2014 - 10/26/2014 Radiation Therapy Whole Brain XRT   11/14/2014 Imaging MUGA- LVEF 67%   02/13/2015 Imaging MUGA- LVEF 59%   02/15/2015 Treatment Plan Change Due to declining LVEF, will hold Herceptin per PI guidelines.   04/12/2015 -  Chemotherapy Herceptin restarted   06/02/2015 - 06/08/2015 Hospital Admission Pneumonia   07/05/2015 Progression  PET/CT concern for mild progression of skeletal metastasis with several lesions within the spine and 1 lesion in the Left iliac wing with mild to moderate metabolic activity new from prior. Rising CA 27-29   07/16/2015 Imaging MRI brain with satisfactory post treatment apperance of brain. interval resolved enhancing R caudate metastasis, minimal punctate residual enhancing metastatic disease at the inferior L cerebellum. No new metastatic disease or new intracranial abnormality   07/19/2015 Treatment Plan Change Discontinue Tamoxifen, Zoladex plus Arimidex.    08/27/2015 Procedure Laparoscopic bilateral salpingo-oophorectomy by Dr. Gaetano Net    CURRENT THERAPY: Herceptin/Arimidex//XGEVA  INTERVAL HISTORY: [name] 50 y.o. female returns for follow-up of stage IV adenocarcinoma of the breast, ER+, HER 2 + disease. Mrs. Cong was here with husband today. She is here for ongoing herceptin therapy.  Mrs. Budde is accompanied by her husband and presents in treatment chair.  She was found to have the beginning stages of pneumonia after a flight to [place]. She has also been to  the beach  since our last visit. She notes that physically she is doing well. She has been enjoying her children and grandchild.  She received an injection into her left shoulder which improved it for a little while. However, this left shoulder pain has returned. She was told there was nothing else they could do for her shoulder pain.   She begins to feel pain where her legs connect to her hips if she sits for extended periods. Reports her leg gave out on her while climbing stairs the other day.  Denies headaches. She has a poor appetite, eating only two small meals a day. Weight is stable. Reports she is sleeping too much, however she is unable to sleep at night. She has begun taking melatonin one 3 mg at night to help. They have purchased a hot tub, which helps with her joint pain. She notes that she is not interested in changing her arimidex as overall her joint discomfort is not limiting.  Denies heart palpitations or chest pain. No blurry vision. No other major complaints or concerns.    MEDICAL HISTORY: Past Medical History  Diagnosis Date  . Depression   . Anxiety   . Family history of prostate cancer   . Drug-induced cardiomyopathy ([place])     per last MUGA (08-08-2015), ef 56.5/ per last echo 05-18-2014 ef 60-65%  . PONV (postoperative nausea and vomiting)     pt states scope patch does well  . History of DVT (deep vein thrombosis)     07-09-2014  upper right extremity-  RIJ and right subclavian--  resolved  . Chronic pain syndrome     secondary to cancer   . GERD (gastroesophageal reflux disease)   . History of colon polyps     07-13-2013  benign  . History of gastritis     erosive  . History of radiation therapy     12 fractions to L3 - S3, 30Gy and left spacula 20Gy (06-20-2014 to 07-10-2014) //  whole brain rxt (10-09-2014 to 10-26-2014)  . History of small bowel obstruction     S/P RESECTION 2008  . Breast cancer, stage 4 [facility]) oncologist-  dr Larene [place] ([facility])     dx 12/ 2015 -- breast cancer Stage 4,  ER/HER2 +,  w/  liver, brain and  bone mets/  chemotherapy and radiation therapy  . Breast cancer metastasized to multiple sites [facility])     liver, brain, and bone  . Migraine   . History of pneumonia     HCAP 06-07-2015--  resolved per cxr 07-04-2015  . Anticoagulated     xarelto    has Hyperlipidemia; Depression; Breast cancer, stage 4 ([place]); Bone metastases ([place]); DVT (deep venous thrombosis) ([place]); GERD (gastroesophageal reflux disease); Family history of prostate cancer; Genetic testing; Hot flashes due to tamoxifen; Brain metastases ([place]); Drug-induced cardiomyopathy ([place]); HCAP (healthcare-associated pneumonia); Pneumonia; Metastatic breast cancer ([place]); CAP (community acquired pneumonia); Esophageal reflux; and Persistent cough on her problem list.      has No Known Allergies.  Ms. Joswick had no medications administered during this visit.  SURGICAL HISTORY: Past Surgical History  Procedure Laterality Date  . Breast reduction surgery  03/17/2011    Procedure: MAMMARY REDUCTION BILATERAL (BREAST);  Surgeon: Mary A Contogiannis;  Location: [place];  Service: Plastics;  Laterality: Bilateral;  . Colonoscopy N/A 07/13/2013    Procedure: COLONOSCOPY;  Surgeon: Rogene Houston, MD;  [facility];  Service: Endoscopy;  Laterality:  N/A;  930  . Portacath placement  05-17-2014  . Esophagogastroduodenoscopy N/A 05/25/2014    Procedure: ESOPHAGOGASTRODUODENOSCOPY (EGD);  Surgeon: Rogene Houston, MD;  [facility];  Service: Endoscopy;  Laterality: N/A;  155  . Colonoscopy N/A 11/26/2014    Procedure: COLONOSCOPY;  Surgeon: Rogene Houston, MD;  [facility];  Service: Endoscopy;  Laterality: N/A;  730  . Shoulder arthroscopy with rotator cuff repair Right 2002  . Cervical fusion  2003    C5 -- C6  . Knee arthroscopy Right 2005  . Laparoscopic cholecystectomy  11-17-2002  . Dx laparoscopy w/ partial small  bowel resection and appendectomy  04-13-2007  . Cataract extraction w/ intraocular lens  implant, bilateral  2008  . Laparoscopic assisted vaginal hysterectomy  10-13-2010    w/ Bx Left Fallopian tube and Aspiration Right Ovarian Cyst  . D & c hysteroscopy/  resection endometrial mass/  novasure endometrial ablation  04-11-2010  . Transthoracic echocardiogram  05-18-2014    ef 60-65%//   last MUGA  (08-08-2015)  ef 56.6%    . Laparoscopic salpingo oopherectomy Bilateral 08/26/2015    Procedure: LAPAROSCOPIC SALPINGO OOPHORECTOMY, bilateral;  Surgeon: Everlene Farrier, MD;  Location: [place];  Service: Gynecology;  Laterality: Bilateral;    SOCIAL HISTORY: Social History   Social History  . Marital Status: Married    Spouse Name: N/A  . Number of Children: N/A  . Years of Education: N/A   Occupational History  . Not on file.   Social History Main Topics  . Smoking status: Former Smoker    Types: Cigarettes    Quit date: 08/20/1994  . Smokeless tobacco: Never Used  . Alcohol Use: Yes     Comment: Occasionally  . Drug Use: No  . Sexual Activity: Not on file   Other Topics Concern  . Not on file   Social History Narrative    FAMILY HISTORY: Family History  Problem Relation Age of Onset  . Diabetes Father   . Heart attack Maternal Grandmother 30    multiple over lifetime.  . Cancer Maternal Grandmother 55    NOS  . Prostate cancer Maternal Grandfather     dx in his 80s  . Lung cancer Paternal Grandfather     dx <50  . Lymphoma Maternal Aunt     dx in her 60s  . Melanoma Cousin 59    maternal first cousin  . Brain cancer Cousin     paternal first cousin dx under 21  . Prostate cancer Other     MGF's father  . Colon cancer Other     MGM's mother  Her son lives in [place]. Her daughter lives in [place].  Review of Systems  Constitutional: Positive for hot flashes. Negative for fever, chills, weight loss.  HENT: . Negative for congestion,  hearing loss, sore throat and tinnitus.   Eyes: Negative for double vision, pain and discharge.  Respiratory:  Negative for cough, hemoptysis, sputum production, shortness of breath and wheezing.   Cardiovascular: Negative for chest pain, palpitations, claudication, and PND.  Gastrointestinal: Negative for heartburn, nausea, diarrhea, constipation, blood in stool and melena, vomiting.  Genitourinary:Negative for dysuria, urgency, frequency and hematuria.  Musculoskeletal: Positive for joint pain, shoulder pain, hip pain, and back pain. Negative for myalgias and falls.  Left shoulder pain. Hip pain with extended sitting. Skin: Negative for itching and rash.  Neurological: . Negative for tingling, tremors, speech change, focal weakness, seizures,  loss of consciousness, weakness. Headaches or blurry vision Endo/Heme/Allergies: Does not bruise/bleed easily.  Psychiatric/Behavioral: Positive for short term memory loss. Negative for suicidal ideas, and substance abuse, nervous/axiety, depression.  14 point review of systems was performed and is negative except as detailed under history of present illness and above   PHYSICAL EXAMINATION  ECOG PERFORMANCE STATUS: 1 - Symptomatic but completely ambulatory   Vitals with BMI 10/04/2015  Height   Weight 243 lbs 13 oz  BMI   Systolic 945  Diastolic 64  Pulse 91  Respirations 16    Physical Exam  Constitutional: She is oriented to person, place, and time and well-developed, well-nourished, and in no distress.  HENT:  Head: Normocephalic and atraumatic. Hair regrowth. Nose: Nose normal.  Mouth/Throat:  Negative Eyes: Conjunctivae and EOM are normal. Pupils are equal, round, and reactive to light. Right eye exhibits no discharge. Left eye exhibits no discharge. No scleral icterus.  Neck: Normal range of motion. No tracheal deviation present. No thyromegaly present.  Cardiovascular: Normal rate, regular rhythm and normal heart sounds.  Exam  reveals no gallop and no friction rub.   No murmur heard. Pulmonary/Chest: Effort normal and breath sounds normal. She has no wheezes. She has no rales.  Abdominal: Soft. Bowel sounds are normal. She exhibits no distension and no mass. There is no tenderness. There is no rebound and no guarding.  Musculoskeletal: Normal range of motion.  Lymphadenopathy:    She has no cervical adenopathy.  Neurological: She is alert and oriented to person, place, and time. She has normal reflexes. No cranial nerve deficit. Gait normal. Coordination normal.  Skin: Skin is warm and dry. No rash noted.  Tan skin. Psychiatric: Mood, memory, affect and judgment normal.  Nursing note and vitals reviewed.  LABORATORY DATA: I have reviewed the data as listed.  CBC    Component Value Date/Time   WBC 6.1 10/04/2015 1000   RBC 4.27 10/04/2015 1000   HGB 12.4 10/04/2015 1000   HCT 38.8 10/04/2015 1000   PLT 249 10/04/2015 1000   MCV 90.9 10/04/2015 1000   MCH 29.0 10/04/2015 1000   MCHC 32.0 10/04/2015 1000   RDW 14.4 10/04/2015 1000   LYMPHSABS 1.0 10/04/2015 1000   MONOABS 0.4 10/04/2015 1000   EOSABS 0.1 10/04/2015 1000   BASOSABS 0.0 10/04/2015 1000   CMP     Component Value Date/Time   NA 137 10/04/2015 1000   K 3.5 10/04/2015 1000   CL 100* 10/04/2015 1000   CO2 29 10/04/2015 1000   GLUCOSE 134* 10/04/2015 1000   BUN 11 10/04/2015 1000   CREATININE 0.69 10/04/2015 1000   CALCIUM 8.9 10/04/2015 1000   PROT 7.0 10/04/2015 1000   ALBUMIN 3.6 10/04/2015 1000   AST 22 10/04/2015 1000   ALT 18 10/04/2015 1000   ALKPHOS 59 10/04/2015 1000   BILITOT 0.2* 10/04/2015 1000   GFRNONAA >60 10/04/2015 1000   GFRAA >60 10/04/2015 1000   Results for TRENIKA, HUDSON (MRN [id number]) as of 10/06/2015 17:06  Ref. Range 01/08/2015 10:50 02/15/2015 11:03 04/12/2015 10:39 05/03/2015 11:44 06/13/2015 10:40 07/25/2015 09:15 09/06/2015 08:05 10/04/2015 10:00  CA 27.29 Latest Ref Range: 0.0-38.6 U/mL 34.3 27.8 27.0 22.8  42.0 (H) 21.9 24.1 34.8    RADIOLOGY: I have personally reviewed the radiological images as listed and agreed with the findings in the report.  Study Result     CLINICAL DATA: Productive cough for 1 week  EXAM: CHEST 2 VIEW  COMPARISON: 07/05/2015 and  06/02/2015  FINDINGS: Cardiomediastinal silhouette is stable. Right IJ Port-A-Cath tip in SVC. There is streaky atelectasis or early infiltrate in left base retrocardiac. No pulmonary edema.  IMPRESSION: Streaky atelectasis or early infiltrate left base retrocardiac. No pulmonary edema. Follow-up to resolution recommended.   Electronically Signed  By: Lahoma Crocker M.D.  On: 09/16/2015 16:14   Study Result     CLINICAL DATA: Patient to receive cardiotoxic chemotherapy.  EXAM: NUCLEAR MEDICINE CARDIAC BLOOD POOL IMAGING (MUGA)  TECHNIQUE: Cardiac multi-gated acquisition was performed at rest following intravenous injection of Tc-47mlabeled red blood cells.  RADIOPHARMACEUTICALS: Twenty-seven mCi Tc-926mDP in-vitro labeled red blood cells IV  COMPARISON: 05/08/2015, and 05/18/2014.  FINDINGS: The left ventricular ejection fraction is calculated to equal 56.5%. On 05/08/2015 the left ventricular ejection fraction equaled 58%. On the previous exam from 05/18/2014 the left ventricular ejection fraction was equal to 70%.  IMPRESSION: 1. The left ventricular ejection fraction currently equals 56.5%. This is not significantly changed from 05/08/2015. However, this has decreased by 19% when compared with 05/18/2014 when it equaled 70%.   Electronically Signed  By: TaKerby Moors.D.  On: 08/08/2015 15:58     ASSESSMENT and THERAPY PLAN:  Stage IV ER positive, HER-2 positive carcinoma of the breast Bone metastases Widespread Brain Metastases Insomnia Anxiety UE DVT, RIJ and R subclavian Cancer related Fatigue Decline in EF, Herceptin held. Herceptin restarted on 04/12/2015 06/2014  progression of disease (bone only) with rise in tumor markers, abnormal PET Arimidex, herceptin, XGEVA  Clinically is doing well. CA 27 29 has increased. Her tumor markers have been very predictive. She is due for upcoming restaging. She overall is feeling well.  She is to continue on arimidex, calcium and vitamin D, XGEVA and herceptin until repeat imaging is complete.   She is scheduled for a MUGA on 11/04/15. I have ordered a PET and brain MRI scan to be performed the first week of June towards the end of that week.  I have refilled the patient's MS Contin.  She will return after these scans to discuss the results and for additional recommendations.  NCCN guidelines recommends the following for monitoring of metastatic breast cancer:  A. Components of monitoring:   1. Monitoring includes periodic assessment of varied combinations of symptoms, physical examination, routine laboratory tests, imaging studies, and blood biomarkers where appropriate. Results of monitoring are classified as response/continued response to treatment, stable disease, uncertainty regarding disease status, or progression of disease. The clinician typically must assess and balance multiple different forms of information to make a determination regarding whether disease is being controlled and the toxicity of treatment is acceptable. Sometimes, this information may be contradictory.  B. Definition of disease progression:   1. Unequivocal evidence of progression of disease by one or more of these factors is required to establish progression of disease, either because of ineffective therapy or acquired resistance of disease to an applied therapy.  Progression of disease may be identified through evidence of growth or worsening of disease at previously known sites of disease and/or of the occurrence of new sites of metastatic disease.   2. Findings concerning for progression of disease include:    A. Worsening symptoms such as  pain or dyspnea    B. Evidence of worsening or new disease on physical examination.    C. Declining performance status    D. Unexplained weight loss    E. Increasing Alkaline phosphatase, ALT, AST, or bilirubin    F. Hypercalcemia    G.  New radiographic abnormality or increase in the size of pre-existing radiographic abnormality.    H. New areas of abnormality on functional imaging (eg, bone scan, PET/CT scan)    I. Increasing tumor markers (eg, CEA, CA 15-3, CA27.29)  All questions were answered. The patient knows to call the clinic with any problems, questions or concerns. We can certainly see the patient much sooner if necessary.   This note was signed electronically.  This document serves as a record of services personally performed by Ancil Linsey, MD. It was created on her behalf by Arlyce Harman, a trained medical scribe. The creation of this record is based on the scribe's personal observations and the provider's statements to them. This document has been checked and approved by the attending provider.  I have reviewed the above documentation for accuracy and completeness, and I agree with the above.  Kelby Fam. Whitney Muse, MD

## 2015-10-04 NOTE — Patient Instructions (Addendum)
[place] at [facility] Discharge Instructions  RECOMMENDATIONS MADE BY THE CONSULTANT AND ANY TEST RESULTS WILL BE SENT TO YOUR REFERRING PHYSICIAN.  Seen by Dr. Whitney Muse. Continue Hercepton every 3 weeks. PET scan and MRI ordered. Return to clinic to see Dr. Whitney Muse after PET scan and MRI completed. Call clinic with any questions or concerns.   Thank you for choosing [place] at [facility] to provide your oncology and hematology care.  To afford each patient quality time with our provider, please arrive at least 15 minutes before your scheduled appointment time.   Beginning January 23rd 2017 lab work for the [facility] will be done in the  Main lab at [facility] on 1st floor. If you have a lab appointment with the [facility] please come in thru the  Main Entrance and check in at the main information desk  You need to re-schedule your appointment should you arrive 10 or more minutes late.  We strive to give you quality time with our providers, and arriving late affects you and other patients whose appointments are after yours.  Also, if you no show three or more times for appointments you may be dismissed from the clinic at the providers discretion.     Again, thank you for choosing [facility].  Our hope is that these requests will decrease the amount of time that you wait before being seen by our physicians.       _____________________________________________________________  Should you have questions after your visit to [facility], please contact our office at [phone] between the hours of 8:30 a.m. and 4:30 p.m.  Voicemails left after 4:30 p.m. will not be returned until the following business day.  For prescription refill requests, have your pharmacy contact our office.         Resources For Cancer Patients and their Caregivers ? American Cancer Society: Can assist with transportation, wigs,  general needs, runs Look Good Feel Better.        212 495 3309 ? Cancer Care: Provides financial assistance, online support groups, medication/co-pay assistance.  1-800-813-HOPE 618-261-1965) ? [facility] Assists [place] cancer patients and their families through emotional , educational and financial support.  640-527-9753 ? [place] DSS Where to apply for food stamps, Medicaid and utility assistance. (831)170-2993 ? [facility]: Transportation to medical appointments. (438)485-4962 ? Social Security Administration: May apply for disability if have a Stage IV cancer. 301-162-9873 330-540-5231 ? [employer], Disability and Transit Services: Assists with nutrition, care and transit needs. [facility] Support Programs: @10RELATIVEDAYS @ > Cancer Support Group  2nd Tuesday of the month 1pm-2pm, Journey Room  > Creative Journey  3rd Tuesday of the month 1130am-1pm, Journey Room  > Look Good Feel Better  1st Wednesday of the month 10am-12 noon, Journey Room (Call [place] to register [phone])

## 2015-10-04 NOTE — Progress Notes (Signed)
[name] presents today for injection per MD orders. Xgeva 120mg  administered SQ in right Abdomen. Administration without incident. Patient tolerated well. Patient tolerated infusion well.  VSS.

## 2015-10-04 NOTE — Progress Notes (Signed)
Please see other encounter for documentation.   

## 2015-10-05 LAB — CANCER ANTIGEN 27.29: CA 27.29: 34.8 U/mL (ref 0.0–38.6)

## 2015-10-06 ENCOUNTER — Encounter (HOSPITAL_COMMUNITY): Payer: Self-pay | Admitting: Hematology & Oncology

## 2015-10-09 ENCOUNTER — Encounter (HOSPITAL_COMMUNITY): Payer: Self-pay | Admitting: Oncology

## 2015-10-14 ENCOUNTER — Encounter (HOSPITAL_COMMUNITY): Payer: Self-pay | Admitting: Hematology & Oncology

## 2015-10-17 ENCOUNTER — Ambulatory Visit (HOSPITAL_COMMUNITY)
Admission: RE | Admit: 2015-10-17 | Discharge: 2015-10-17 | Disposition: A | Discharge status: Home / Self Care | Payer: 59 | Source: Ambulatory Visit | Attending: Hematology & Oncology | Admitting: Hematology & Oncology

## 2015-10-17 DIAGNOSIS — C50919 Malignant neoplasm of unspecified site of unspecified female breast: Secondary | ICD-10-CM

## 2015-10-17 DIAGNOSIS — C799 Secondary malignant neoplasm of unspecified site: Secondary | ICD-10-CM | POA: Insufficient documentation

## 2015-10-17 DIAGNOSIS — C7931 Secondary malignant neoplasm of brain: Secondary | ICD-10-CM

## 2015-10-17 DIAGNOSIS — R938 Abnormal findings on diagnostic imaging of other specified body structures: Secondary | ICD-10-CM | POA: Insufficient documentation

## 2015-10-17 DIAGNOSIS — Z923 Personal history of irradiation: Secondary | ICD-10-CM | POA: Insufficient documentation

## 2015-10-17 DIAGNOSIS — C7951 Secondary malignant neoplasm of bone: Secondary | ICD-10-CM | POA: Diagnosis not present

## 2015-10-17 LAB — GLUCOSE, CAPILLARY: Glucose-Capillary: 96 mg/dL (ref 65–99)

## 2015-10-17 MED ORDER — FLUDEOXYGLUCOSE F - 18 (FDG) INJECTION
12.1200 | Freq: Once | INTRAVENOUS | Status: AC | PRN
  Administered 2015-10-17: 12.12 via INTRAVENOUS

## 2015-10-18 ENCOUNTER — Ambulatory Visit (HOSPITAL_COMMUNITY)
Admission: RE | Admit: 2015-10-18 | Discharge: 2015-10-18 | Disposition: A | Discharge status: Home / Self Care | Payer: 59 | Source: Ambulatory Visit | Attending: Hematology & Oncology | Admitting: Hematology & Oncology

## 2015-10-18 DIAGNOSIS — C50919 Malignant neoplasm of unspecified site of unspecified female breast: Secondary | ICD-10-CM | POA: Diagnosis not present

## 2015-10-18 DIAGNOSIS — C7931 Secondary malignant neoplasm of brain: Secondary | ICD-10-CM

## 2015-10-18 MED ORDER — GADOBENATE DIMEGLUMINE 529 MG/ML IV SOLN
20.0000 mL | Freq: Once | INTRAVENOUS | Status: AC | PRN
  Administered 2015-10-18: 20 mL via INTRAVENOUS

## 2015-10-21 ENCOUNTER — Other Ambulatory Visit (HOSPITAL_COMMUNITY): Payer: Self-pay | Admitting: Oncology

## 2015-10-21 ENCOUNTER — Encounter (HOSPITAL_BASED_OUTPATIENT_CLINIC_OR_DEPARTMENT_OTHER): Payer: 59

## 2015-10-21 ENCOUNTER — Encounter (HOSPITAL_COMMUNITY): Payer: Self-pay | Admitting: Hematology & Oncology

## 2015-10-21 ENCOUNTER — Encounter (HOSPITAL_COMMUNITY): Payer: 59 | Attending: Hematology & Oncology | Admitting: Hematology & Oncology

## 2015-10-21 VITALS — BP 96/77 | HR 105 | Temp 97.9°F | Resp 16 | Wt 244.0 lb

## 2015-10-21 VITALS — BP 122/53 | HR 84 | Temp 97.8°F | Resp 16 | Wt 244.0 lb

## 2015-10-21 DIAGNOSIS — C7931 Secondary malignant neoplasm of brain: Secondary | ICD-10-CM | POA: Diagnosis not present

## 2015-10-21 DIAGNOSIS — R5382 Chronic fatigue, unspecified: Secondary | ICD-10-CM

## 2015-10-21 DIAGNOSIS — M2550 Pain in unspecified joint: Secondary | ICD-10-CM

## 2015-10-21 DIAGNOSIS — Z5112 Encounter for antineoplastic immunotherapy: Secondary | ICD-10-CM

## 2015-10-21 DIAGNOSIS — R530 Neoplastic (malignant) related fatigue: Secondary | ICD-10-CM

## 2015-10-21 DIAGNOSIS — C229 Malignant neoplasm of liver, not specified as primary or secondary: Secondary | ICD-10-CM | POA: Diagnosis not present

## 2015-10-21 DIAGNOSIS — C50919 Malignant neoplasm of unspecified site of unspecified female breast: Secondary | ICD-10-CM

## 2015-10-21 DIAGNOSIS — C7951 Secondary malignant neoplasm of bone: Secondary | ICD-10-CM | POA: Insufficient documentation

## 2015-10-21 LAB — CBC WITH DIFFERENTIAL/PLATELET
Basophils Absolute: 0 10*3/uL (ref 0.0–0.1)
Basophils Relative: 0 %
Eosinophils Absolute: 0.1 10*3/uL (ref 0.0–0.7)
Eosinophils Relative: 1 %
HCT: 35.7 % — ABNORMAL LOW (ref 36.0–46.0)
Hemoglobin: 12 g/dL (ref 12.0–15.0)
Lymphocytes Relative: 13 %
Lymphs Abs: 0.8 10*3/uL (ref 0.7–4.0)
MCH: 30.5 pg (ref 26.0–34.0)
MCHC: 33.6 g/dL (ref 30.0–36.0)
MCV: 90.6 fL (ref 78.0–100.0)
Monocytes Absolute: 0.5 10*3/uL (ref 0.1–1.0)
Monocytes Relative: 8 %
Neutro Abs: 4.7 10*3/uL (ref 1.7–7.7)
Neutrophils Relative %: 78 %
Platelets: 227 10*3/uL (ref 150–400)
RBC: 3.94 MIL/uL (ref 3.87–5.11)
RDW: 14 % (ref 11.5–15.5)
WBC: 6.1 10*3/uL (ref 4.0–10.5)

## 2015-10-21 LAB — COMPREHENSIVE METABOLIC PANEL
ALT: 16 U/L (ref 14–54)
AST: 20 U/L (ref 15–41)
Albumin: 3.7 g/dL (ref 3.5–5.0)
Alkaline Phosphatase: 65 U/L (ref 38–126)
Anion gap: 7 (ref 5–15)
BUN: 11 mg/dL (ref 6–20)
CO2: 26 mmol/L (ref 22–32)
Calcium: 9 mg/dL (ref 8.9–10.3)
Chloride: 104 mmol/L (ref 101–111)
Creatinine, Ser: 0.79 mg/dL (ref 0.44–1.00)
GFR calc Af Amer: >60 mL/min (ref >60)
GFR calc non Af Amer: >60 mL/min (ref >60)
Glucose, Bld: 147 mg/dL — ABNORMAL HIGH (ref 65–99)
Potassium: 3.6 mmol/L (ref 3.5–5.1)
Sodium: 137 mmol/L (ref 135–145)
Total Bilirubin: 0.3 mg/dL (ref 0.3–1.2)
Total Protein: 6.9 g/dL (ref 6.5–8.1)

## 2015-10-21 MED ORDER — FUROSEMIDE 20 MG PO TABS: 20.0000 mg | ORAL_TABLET | Freq: Two times a day (BID) | ORAL | Status: DC

## 2015-10-21 MED ORDER — ACETAMINOPHEN 325 MG PO TABS
650.0000 mg | ORAL_TABLET | Freq: Once | ORAL | Status: AC
  Administered 2015-10-21: 650 mg via ORAL

## 2015-10-21 MED ORDER — DIPHENHYDRAMINE HCL 25 MG PO CAPS
ORAL_CAPSULE | ORAL | Status: AC
  Filled 2015-10-21: qty 2

## 2015-10-21 MED ORDER — DIPHENHYDRAMINE HCL 25 MG PO CAPS
50.0000 mg | ORAL_CAPSULE | Freq: Once | ORAL | Status: AC
  Administered 2015-10-21: 50 mg via ORAL

## 2015-10-21 MED ORDER — SODIUM CHLORIDE 0.9 % IV SOLN
Freq: Once | INTRAVENOUS | Status: AC
  Administered 2015-10-21: 10:00:00 via INTRAVENOUS

## 2015-10-21 MED ORDER — HEPARIN SOD (PORK) LOCK FLUSH 100 UNIT/ML IV SOLN
500.0000 [IU] | Freq: Once | INTRAVENOUS | Status: AC
  Administered 2015-10-21: 500 [IU] via INTRAVENOUS

## 2015-10-21 MED ORDER — HEPARIN SOD (PORK) LOCK FLUSH 100 UNIT/ML IV SOLN: 500.0000 [IU] | Freq: Once | INTRAVENOUS | Status: AC | PRN

## 2015-10-21 MED ORDER — METHYLPHENIDATE HCL 10 MG PO TABS: ORAL_TABLET | ORAL | Status: DC

## 2015-10-21 MED ORDER — TRASTUZUMAB CHEMO INJECTION 440 MG
6.0000 mg/kg | Freq: Once | INTRAVENOUS | Status: AC
  Administered 2015-10-21: 672 mg via INTRAVENOUS
  Filled 2015-10-21: qty 32

## 2015-10-21 MED ORDER — ACETAMINOPHEN 325 MG PO TABS
ORAL_TABLET | ORAL | Status: AC
  Filled 2015-10-21: qty 2

## 2015-10-21 MED ORDER — LIDOCAINE-PRILOCAINE 2.5-2.5 % EX CREA: 1.0000 "application " | TOPICAL_CREAM | Freq: Every day | CUTANEOUS | Status: DC | PRN

## 2015-10-21 MED ORDER — SODIUM CHLORIDE 0.9 % IJ SOLN
10.0000 mL | INTRAMUSCULAR | Status: DC | PRN
  Administered 2015-10-21: 10 mL
  Filled 2015-10-21: qty 10

## 2015-10-21 NOTE — Progress Notes (Signed)
1200:  Tolerated infusion w/o adverse reaction.  A&Ox4, in no distress.  VSS.  Discharged ambulatory in c/o spouse for transport home.

## 2015-10-21 NOTE — Progress Notes (Signed)
[facility] [place], MD [place] [address]    Breast cancer, stage 4 [facility])   04/25/2014 Initial Diagnosis Breast cancer, stage 4   04/25/2014 Imaging CT abdomen/pelvia with widespread metastatic disease to the liver, multiple lytic lesions throughout spine and pelvis. No FX or epidural tumor identified   04/26/2014 Imaging CT head unremarkable   04/26/2014 Imaging CT chest with no lung mass or pulmonary nodules, no adenopathy. Lytic bone lesions, right 2nd rib   04/27/2014 Initial Biopsy U/S guided liver biopsy, lesion in anterior and inferior left hepatic lobe biopsied   04/27/2014 Pathology Results Metastatic adenocarcinoma, CK7, ER+, patchy positivity with PR. Possible primary includes breast, less likely gynecologic   05/15/2014 Mammogram BI-RADS CATEGORY  2: Benign Finding(s)   05/16/2014 PET scan 1. Intensely hypermetabolic hepatic metastasis. 2. Widespread hypermetabolic skeletal lesions. 3. No primary adenocarcinoma identified by FDG PET imaging.   05/19/2014 Imaging MUGA- Left ventricular ejection fracture greater than 70%.   05/21/2014 Breast MRI No suspicious masses or enhancement within the breasts. No axillary adenopathy.   05/22/2014 - 07/03/2014 Antibody Plan Herceptin/Perjeta/Tamoxifen   06/12/2014 - 07/03/2014 Chemotherapy Taxotere added secondary to persistent abdominal and back pain   06/17/2014 - 06/19/2014 Hospital Admission Neutropenia, fever, diarrhea, nausea, vomiting   06/20/2014 - 07/10/2014 Radiation Therapy Dr. Thea Silversmith 12 fractions to L3-S3 (30 Gy) and left scapula (20 Gy).    07/03/2014 Adverse Reaction Perjeta- induced diarrhea.  Perjeta discontinued   07/16/2014 - 07/20/2014 Hospital Admission Electrolyte abnormalities, and diarrhea.  Suspect Perjeta-induced diarrhea.  Negative GI work-up.   07/24/2014 -  Chemotherapy Herceptin/Tamoxifen/Xgeva   08/21/2014 Imaging MUGA- Left ventricular ejection fraction  equals 71%.   08/24/2014 PET scan Dramatic reduction in metabolic activity of the widespread liver metastasis. Liver metastasis now have metabolic activity equal to background normal liver activity. Liver has a nodular contour. Marked reduction in metabolic activity of skeletal lesions..   10/05/2014 Progression Widespread metastatic disease to the brain as described. Between 20 and 30 intracranial metastatic deposits are now seen. No midline shift or incipient herniation   10/09/2014 - 10/26/2014 Radiation Therapy Whole Brain XRT   11/14/2014 Imaging MUGA- LVEF 67%   02/13/2015 Imaging MUGA- LVEF 59%   02/15/2015 Treatment Plan Change Due to declining LVEF, will hold Herceptin per PI guidelines.   04/12/2015 -  Chemotherapy Herceptin restarted   06/02/2015 - 06/08/2015 Hospital Admission Pneumonia   07/05/2015 Progression  PET/CT concern for mild progression of skeletal metastasis with several lesions within the spine and 1 lesion in the Left iliac wing with mild to moderate metabolic activity new from prior. Rising CA 27-29   07/16/2015 Imaging MRI brain with satisfactory post treatment apperance of brain. interval resolved enhancing R caudate metastasis, minimal punctate residual enhancing metastatic disease at the inferior L cerebellum. No new metastatic disease or new intracranial abnormality   07/19/2015 Treatment Plan Change Discontinue Tamoxifen, Zoladex plus Arimidex.    08/27/2015 Procedure Laparoscopic bilateral salpingo-oophorectomy by Dr. Gaetano Net   10/17/2015 PET scan Osseous metastatic disease appears slightly progressive based on a new right scapular lesion and increased uptake within lesions in the thoracic spine, left iliac wing and  proximal right femur.   10/17/2015 Progression Slight progression on PET scan imaging.   10/18/2015 Imaging REsolved enhancing metastatic disease to the brain status post WBXRT    CURRENT THERAPY: Herceptin/Arimidex//XGEVA  INTERVAL HISTORY: [name] 50 y.o. female  returns for follow-up  of stage IV adenocarcinoma of the breast, ER+, HER 2 + disease. [name] was here with husband today. She is here for ongoing herceptin therapy. In addition she presents today to review repeat imaging, PET and MRI.  [name] returns to the [facility] today accompanied by her husband Thayer Jew.  She complains of joint pain, noting today that it has definitely worsened with her arimidex.   She is going out of town on Thursday morning,they have planned a beach trip. She denies headaches or blurry vision. She notes that overall she feels well. Appetite remains a problem at times. Mood is overall good.  The only other thing she notes is that she got sick on Saturday night after the PET scan and MRI. She notes that after she "threw up" the next morning she woke up and everything was back to normal.  MEDICAL HISTORY: Past Medical History  Diagnosis Date  . Depression   . Anxiety   . Family history of prostate cancer   . Drug-induced cardiomyopathy ([place])     per last MUGA (08-08-2015), ef 56.5/ per last echo 05-18-2014 ef 60-65%  . PONV (postoperative nausea and vomiting)     pt states scope patch does well  . History of DVT (deep vein thrombosis)     07-09-2014  upper right extremity-  RIJ and right subclavian--  resolved  . Chronic pain syndrome     secondary to cancer   . GERD (gastroesophageal reflux disease)   . History of colon polyps     07-13-2013  benign  . History of gastritis     erosive  . History of radiation therapy     12 fractions to L3 - S3, 30Gy and left spacula 20Gy (06-20-2014 to 07-10-2014) //  whole brain rxt (10-09-2014 to 10-26-2014)  . History of small bowel obstruction     S/P RESECTION 2008  . Breast cancer, stage 4 [facility]) oncologist-  dr Larene [place] ([facility])    dx 12/ 2015 -- breast cancer Stage 4,  ER/HER2 +,  w/  liver, brain and  bone mets/  chemotherapy and radiation therapy  . Breast cancer metastasized to multiple  sites [facility])     liver, brain, and bone  . Migraine   . History of pneumonia     HCAP 06-07-2015--  resolved per cxr 07-04-2015  . Anticoagulated     xarelto    has Hyperlipidemia; Depression; Breast cancer, stage 4 ([place]); Bone metastases ([place]); DVT (deep venous thrombosis) ([place]); GERD (gastroesophageal reflux disease); Family history of prostate cancer; Genetic testing; Hot flashes due to tamoxifen; Brain metastases ([place]); Drug-induced cardiomyopathy ([place]); HCAP (healthcare-associated pneumonia); Pneumonia; Metastatic breast cancer ([place]); CAP (community acquired pneumonia); Esophageal reflux; and Persistent cough on her problem list.      has No Known Allergies.  Ms. Holloman had no medications administered during this visit.  SURGICAL HISTORY: Past Surgical History  Procedure Laterality Date  . Breast reduction surgery  03/17/2011    Procedure: MAMMARY REDUCTION BILATERAL (BREAST);  Surgeon: Mary A Contogiannis;  Location: [facility];  Service: Plastics;  Laterality: Bilateral;  . Colonoscopy N/A 07/13/2013    Procedure: COLONOSCOPY;  Surgeon: Rogene Houston, MD;  [facility];  Service: Endoscopy;  Laterality: N/A;  930  . Portacath placement  05-17-2014  . Esophagogastroduodenoscopy N/A 05/25/2014    Procedure: ESOPHAGOGASTRODUODENOSCOPY (EGD);  Surgeon: Rogene Houston, MD;  [facility];  Service: Endoscopy;  Laterality: N/A;  155  .  Colonoscopy N/A 11/26/2014    Procedure: COLONOSCOPY;  Surgeon: Rogene Houston, MD;  [facility];  Service: Endoscopy;  Laterality: N/A;  730  . Shoulder arthroscopy with rotator cuff repair Right 2002  . Cervical fusion  2003    C5 -- C6  . Knee arthroscopy Right 2005  . Laparoscopic cholecystectomy  11-17-2002  . Dx laparoscopy w/ partial small bowel resection and appendectomy  04-13-2007  . Cataract extraction w/ intraocular lens  implant, bilateral  2008  . Laparoscopic assisted vaginal hysterectomy   10-13-2010    w/ Bx Left Fallopian tube and Aspiration Right Ovarian Cyst  . D & c hysteroscopy/  resection endometrial mass/  novasure endometrial ablation  04-11-2010  . Transthoracic echocardiogram  05-18-2014    ef 60-65%//   last MUGA  (08-08-2015)  ef 56.6%    . Laparoscopic salpingo oopherectomy Bilateral 08/26/2015    Procedure: LAPAROSCOPIC SALPINGO OOPHORECTOMY, bilateral;  Surgeon: Everlene Farrier, MD;  Location: [facility];  Service: Gynecology;  Laterality: Bilateral;    SOCIAL HISTORY: Social History   Social History  . Marital Status: Married    Spouse Name: N/A  . Number of Children: N/A  . Years of Education: N/A   Occupational History  . Not on file.   Social History Main Topics  . Smoking status: Former Smoker    Types: Cigarettes    Quit date: 08/20/1994  . Smokeless tobacco: Never Used  . Alcohol Use: Yes     Comment: Occasionally  . Drug Use: No  . Sexual Activity: Not on file   Other Topics Concern  . Not on file   Social History Narrative    FAMILY HISTORY: Family History  Problem Relation Age of Onset  . Diabetes Father   . Heart attack Maternal Grandmother 30    multiple over lifetime.  . Cancer Maternal Grandmother 29    NOS  . Prostate cancer Maternal Grandfather     dx in his 99s  . Lung cancer Paternal Grandfather     dx <50  . Lymphoma Maternal Aunt     dx in her 50s  . Melanoma Cousin 58    maternal first cousin  . Brain cancer Cousin     paternal first cousin dx under 35  . Prostate cancer Other     MGF's father  . Colon cancer Other     MGM's mother  Her son lives in [place]. Her daughter lives in [place].  Review of Systems  Constitutional: Positive for hot flashes. Negative for fever, chills, weight loss.  HENT: . Negative for congestion, hearing loss, sore throat and tinnitus.   Eyes: Negative for double vision, pain and discharge.  Respiratory:  Negative for cough, hemoptysis, sputum production,  shortness of breath and wheezing.   Cardiovascular: Negative for chest pain, palpitations, claudication, and PND.  Gastrointestinal: Negative for heartburn, nausea, diarrhea, constipation, blood in stool and melena, vomiting.  Genitourinary:Negative for dysuria, urgency, frequency and hematuria.  Musculoskeletal: Positive for joint pain, shoulder pain, hip pain, and back pain. Negative for myalgias and falls.  Left shoulder pain. Hip pain with extended sitting. Skin: Negative for itching and rash.  Neurological: . Negative for tingling, tremors, speech change, focal weakness, seizures, loss of consciousness, weakness. Headaches or blurry vision Endo/Heme/Allergies: Does not bruise/bleed easily.  Psychiatric/Behavioral: Positive for short term memory loss. Negative for suicidal ideas, and substance abuse, nervous/axiety, depression.  14 point review of systems was performed and is negative  except as detailed under history of present illness and above   PHYSICAL EXAMINATION  ECOG PERFORMANCE STATUS: 1 - Symptomatic but completely ambulatory   Vitals - 1 value per visit 9/37/1696  SYSTOLIC 789  DIASTOLIC 53  Pulse 84  Temperature 97.8  Respirations 16  Weight (lb) 244  Height   BMI 37.11  VISIT REPORT     Physical Exam  Constitutional: She is oriented to person, place, and time and well-developed, well-nourished, and in no distress. Well groomed HENT:  Head: Normocephalic and atraumatic. Hair regrowth. Nose: Nose normal.  Mouth/Throat:  Negative Eyes: Conjunctivae and EOM are normal. Pupils are equal, round, and reactive to light. Right eye exhibits no discharge. Left eye exhibits no discharge. No scleral icterus.  Neck: Normal range of motion. No tracheal deviation present. No thyromegaly present.  Cardiovascular: Normal rate, regular rhythm and normal heart sounds.  Exam reveals no gallop and no friction rub.   No murmur heard. Pulmonary/Chest: Effort normal and breath sounds  normal. She has no wheezes. She has no rales.  Abdominal: Soft. Bowel sounds are normal. She exhibits no distension and no mass. There is no tenderness. There is no rebound and no guarding.  Musculoskeletal: Normal range of motion.  Lymphadenopathy:    She has no cervical adenopathy.  Neurological: She is alert and oriented to person, place, and time. She has normal reflexes. No cranial nerve deficit. Gait normal. Coordination normal.  Skin: Skin is warm and dry. No rash noted.  Tan skin. Psychiatric: Mood, memory, affect and judgment normal.  Nursing note and vitals reviewed.  LABORATORY DATA: I have reviewed the data as listed.  CBC    Component Value Date/Time   WBC 6.1 10/21/2015 0853   RBC 3.94 10/21/2015 0853   HGB 12.0 10/21/2015 0853   HCT 35.7* 10/21/2015 0853   PLT 227 10/21/2015 0853   MCV 90.6 10/21/2015 0853   MCH 30.5 10/21/2015 0853   MCHC 33.6 10/21/2015 0853   RDW 14.0 10/21/2015 0853   LYMPHSABS 0.8 10/21/2015 0853   MONOABS 0.5 10/21/2015 0853   EOSABS 0.1 10/21/2015 0853   BASOSABS 0.0 10/21/2015 0853   CMP     Component Value Date/Time   NA 137 10/21/2015 0853   K 3.6 10/21/2015 0853   CL 104 10/21/2015 0853   CO2 26 10/21/2015 0853   GLUCOSE 147* 10/21/2015 0853   BUN 11 10/21/2015 0853   CREATININE 0.79 10/21/2015 0853   CALCIUM 9.0 10/21/2015 0853   PROT 6.9 10/21/2015 0853   ALBUMIN 3.7 10/21/2015 0853   AST 20 10/21/2015 0853   ALT 16 10/21/2015 0853   ALKPHOS 65 10/21/2015 0853   BILITOT 0.3 10/21/2015 0853   GFRNONAA >60 10/21/2015 0853   GFRAA >60 10/21/2015 0853   Results for [name], QUINONES (MRN [id number]) as of 10/21/2015 16:23  Ref. Range 06/13/2015 10:40 07/25/2015 09:15 09/06/2015 08:05 10/04/2015 10:00  CA 27.29  Latest Ref Range: 0.0-38.6 U/mL 42.0 (H) 21.9 24.1 34.8   Results for [name], CAMPTON (MRN [id number]) as of 10/21/2015 16:23  Ref. Range 05/09/2014 10:30 11/06/2014 12:00  Cancer Antigen-Breast 15-3 Latest Ref Range: <32  U/mL >4000 (H) 20     RADIOLOGY: I have personally reviewed the radiological images as listed and agreed with the findings in the report.  Study Result     CLINICAL DATA: 50 year old female with stage IV breast cancer status post whole brain radiation in May and June of 2016. Restaging. Subsequent encounter.  EXAM: MRI  HEAD WITHOUT AND WITH CONTRAST  TECHNIQUE: Multiplanar, multiecho pulse sequences of the brain and surrounding structures were obtained without and with intravenous contrast.  CONTRAST: 83m MULTIHANCE GADOBENATE DIMEGLUMINE 529 MG/ML IV SOLN  COMPARISON: 07/16/2015 brain MRI and earlier.  FINDINGS: Resolved residual punctate enhancing focus in the inferior left cerebellum since March. No residual enhancing brain metastases today. No dural thickening. Stable small residual focus of T2 hyperintensity at the site of the treated right caudate nucleus metastasis (series 5, image 12).  No restricted diffusion to suggest acute infarction. No midline shift, mass effect, evidence of mass lesion, ventriculomegaly, extra-axial collection or acute intracranial hemorrhage. Cervicomedullary junction within normal limits. Negative pituitary. Major intracranial vascular flow voids are stable.  Vague periventricular white matter T2 and FLAIR hyperintensity has mildly increased. Other occasional small nonspecific cerebral white matter foci are stable. No other new signal abnormality in the brain.  Negative visualized cervical spine and spinal cord. Visible bone marrow signal is stable with no suspicious osseous lesion.  Mastoid effusions have regressed. Visible internal auditory structures appear normal. Paranasal sinuses are stable and well pneumatized. Stable and negative orbit and scalp soft tissue.  .Marland Kitchen IMPRESSION: 1. Resolved enhancing metastatic disease to the brain status post whole brain radiation in 1 year ago. Mild post radiation  white matter changes. 2. No new intracranial abnormality or metastatic process identified.   Electronically Signed  By: HGenevie AnnM.D.  On: 10/18/2015 12:10      Study Result     CLINICAL DATA: Subsequent treatment strategy for breast cancer.  EXAM: NUCLEAR MEDICINE PET SKULL BASE TO THIGH  TECHNIQUE: 12.1 mCi F-18 FDG was injected intravenously. Full-ring PET imaging was performed from the skull base to thigh after the radiotracer. CT data was obtained and used for attenuation correction and anatomic localization.  FASTING BLOOD GLUCOSE: Value: 96 Mg/dl  COMPARISON: 07/05/2015.  FINDINGS: NECK  No hypermetabolic lymph nodes in the neck. CT images show no acute findings.  CHEST  Focal hypermetabolism is seen within a soft tissue lesion inferior to the medial left clavicle, measuring 1.7 cm (CT image 47) with an SUV max of 7.6. No hypermetabolic mediastinal, hilar or axillary lymph nodes. No hypermetabolic pulmonary nodules. CT images show a right IJ Port-A-Cath terminating at the SVC RA junction. No pericardial or pleural effusion.  ABDOMEN/PELVIS  There is focal hypermetabolism along the ventral abdominal wall, deep to the umbilicus, with there is slight soft tissue thickening when compared with 07/05/2015. Associated SUV max of 6.1. No abnormal hypermetabolism in the liver, adrenal glands, spleen or pancreas. No hypermetabolic lymph nodes. CT images show irregularity of the liver margin, which can be seen with fibrosis related to treated metastases. Cholecystectomy. Adrenal glands, kidneys, spleen, pancreas and stomach are unremarkable. Postoperative changes are seen in the small bowel.  SKELETON  New focal hypermetabolism is seen in the right scapula with an SUV max of 6.7. A few lesions appear to have increased FDG avidity. For example, mid thoracic spine lesion has an SUV max of 15.8, compared with 5.8 previously. Left iliac wing  lesion has an SUV max of 13.4, previously 5.5. Right femoral neck lesion has an SUV max of 11.5, previously 6.8. Additional scattered lesions have similar activity or no activity.  IMPRESSION: 1. Osseous metastatic disease appears slightly progressive based on a new right scapular lesion and increased uptake within lesions in the thoracic spine, left iliac wing and proximal right femur. 2. Hypermetabolic soft tissue lesion inferior to the medial left  clavicle, new. Difficult to exclude metastatic disease. 3. Focal hypermetabolism associated with soft tissue thickening deep to the umbilicus, nonspecific. Continued attention on followup exams is warranted.   Electronically Signed  By: Lorin Picket M.D.  On: 10/17/2015 12:22     ASSESSMENT and THERAPY PLAN:  Stage IV ER positive, HER-2 positive carcinoma of the breast Bone metastases Widespread Brain Metastases Insomnia Anxiety UE DVT, RIJ and R subclavian Cancer related Fatigue Decline in EF, Herceptin held. Herceptin restarted on 04/12/2015 06/2014 progression of disease (bone only) with rise in tumor markers, abnormal PET Arimidex, herceptin, XGEVA  Clinically [facility] continues to do well. MRI of the brain looks great and we reviewed this in detail today. PET imaging shows bone progression and small soft tissue lesion inferior to L clavicle. Liver is stable.  At this point we will continue with hormone manipulation and change to faslodex IM. I discussed this with her in detail. She is agreeable. I would continue with herceptin. We should also consider changing to zometa.   She is scheduled for a MUGA on 11/04/15.   Requested medications were refilled.   I would follow her tumor markers closely and recommend repeat imaging in 3 months unless clinical situation dictates otherwise.   NCCN guidelines recommends the following for monitoring of metastatic breast cancer:  A. Components of monitoring:   1. Monitoring includes  periodic assessment of varied combinations of symptoms, physical examination, routine laboratory tests, imaging studies, and blood biomarkers where appropriate. Results of monitoring are classified as response/continued response to treatment, stable disease, uncertainty regarding disease status, or progression of disease. The clinician typically must assess and balance multiple different forms of information to make a determination regarding whether disease is being controlled and the toxicity of treatment is acceptable. Sometimes, this information may be contradictory.  B. Definition of disease progression:   1. Unequivocal evidence of progression of disease by one or more of these factors is required to establish progression of disease, either because of ineffective therapy or acquired resistance of disease to an applied therapy.  Progression of disease may be identified through evidence of growth or worsening of disease at previously known sites of disease and/or of the occurrence of new sites of metastatic disease.   2. Findings concerning for progression of disease include:    A. Worsening symptoms such as pain or dyspnea    B. Evidence of worsening or new disease on physical examination.    C. Declining performance status    D. Unexplained weight loss    E. Increasing Alkaline phosphatase, ALT, AST, or bilirubin    F. Hypercalcemia    G. New radiographic abnormality or increase in the size of pre-existing radiographic abnormality.    H. New areas of abnormality on functional imaging (eg, bone scan, PET/CT scan)    I. Increasing tumor markers (eg, CEA, CA 15-3, CA27.29)   All questions were answered. The patient knows to call the clinic with any problems, questions or concerns. We can certainly see the patient much sooner if necessary.   This note was signed electronically.  This document serves as a record of services personally performed by Ancil Linsey, MD. It was created on her behalf  by Toni Amend, a trained medical scribe. The creation of this record is based on the scribe's personal observations and the provider's statements to them. This document has been checked and approved by the attending provider.  I have reviewed the above documentation for accuracy and completeness, and I agree with  the above.  Kelby Fam. Whitney Muse, MD

## 2015-10-21 NOTE — Patient Instructions (Signed)
[facility] Discharge Instructions for Patients Receiving Chemotherapy   Beginning January 23rd 2017 lab work for the [facility] will be done in the  Main lab at [facility] on 1st floor. If you have a lab appointment with the [facility] please come in thru the  Main Entrance and check in at the main information desk   Today you received the following chemotherapy agents:  Herceptin Return as scheduled Wednesday for Faslodex injection (continue Arimidex until then).    If you develop nausea and vomiting, or diarrhea that is not controlled by your medication, call the clinic.  The clinic phone number is [phone]. Office hours are Monday-Friday 8:30am-5:00pm.  BELOW ARE SYMPTOMS THAT SHOULD BE REPORTED IMMEDIATELY:  *FEVER GREATER THAN 101.0 F  *CHILLS WITH OR WITHOUT FEVER  NAUSEA AND VOMITING THAT IS NOT CONTROLLED WITH YOUR NAUSEA MEDICATION  *UNUSUAL SHORTNESS OF BREATH  *UNUSUAL BRUISING OR BLEEDING  TENDERNESS IN MOUTH AND THROAT WITH OR WITHOUT PRESENCE OF ULCERS  *URINARY PROBLEMS  *BOWEL PROBLEMS  UNUSUAL RASH Items with * indicate a potential emergency and should be followed up as soon as possible. If you have an emergency after office hours please contact your primary care physician or go to the nearest emergency department.  Please call the clinic during office hours if you have any questions or concerns.   You may also contact the Patient Navigator at [phone] should you have any questions or need assistance in obtaining follow up care.      Resources For Cancer Patients and their Caregivers ? American Cancer Society: Can assist with transportation, wigs, general needs, runs Look Good Feel Better.        2362201994 ? Cancer Care: Provides financial assistance, online support groups, medication/co-pay assistance.  1-800-813-HOPE 364-710-1977) ? [facility] Assists [place] cancer patients and their  families through emotional , educational and financial support.  380-836-4176 ? [place] DSS Where to apply for food stamps, Medicaid and utility assistance. (217)301-6837 ? [facility]: Transportation to medical appointments. (281) 469-8071 ? Social Security Administration: May apply for disability if have a Stage IV cancer. (260)446-1362 319-869-3521 ? [employer], Disability and Transit Services: Assists with nutrition, care and transit needs. 765-584-2352

## 2015-10-21 NOTE — Patient Instructions (Signed)
[place] at [facility] Discharge Instructions  RECOMMENDATIONS MADE BY THE CONSULTANT AND ANY TEST RESULTS WILL BE SENT TO YOUR REFERRING PHYSICIAN.  Exam done and seen today by Dr. Gustavus Bryant reviewed today. Will get Herceptin today and continue till her Faslodex is authorized. Return to see the doctor as scheduled Please call the clinic if you have any questions or concerns  Thank you for choosing [place] at [facility] to provide your oncology and hematology care.  To afford each patient quality time with our provider, please arrive at least 15 minutes before your scheduled appointment time.   Beginning January 23rd 2017 lab work for the [facility] will be done in the  Main lab at [facility] on 1st floor. If you have a lab appointment with the [place] please come in thru the  Main Entrance and check in at the main information desk  You need to re-schedule your appointment should you arrive 10 or more minutes late.  We strive to give you quality time with our providers, and arriving late affects you and other patients whose appointments are after yours.  Also, if you no show three or more times for appointments you may be dismissed from the clinic at the providers discretion.     Again, thank you for choosing [facility].  Our hope is that these requests will decrease the amount of time that you wait before being seen by our physicians.       _____________________________________________________________  Should you have questions after your visit to [facility], please contact our office at [phone] between the hours of 8:30 a.m. and 4:30 p.m.  Voicemails left after 4:30 p.m. will not be returned until the following business day.  For prescription refill requests, have your pharmacy contact our office.         Resources For Cancer Patients and their Caregivers ? American Cancer Society: Can  assist with transportation, wigs, general needs, runs Look Good Feel Better.        641-439-6532 ? Cancer Care: Provides financial assistance, online support groups, medication/co-pay assistance.  1-800-813-HOPE 480-793-9721) ? [employer] Assists [place] cancer patients and their families through emotional , educational and financial support.  713-391-7684 ? [place] DSS Where to apply for food stamps, Medicaid and utility assistance. 229-360-9219 ? RCATS: Transportation to medical appointments. (279)643-4214 ? Social Security Administration: May apply for disability if have a Stage IV cancer. 412-835-9848 (431)877-4026 ? [employer], Disability and Transit Services: Assists with nutrition, care and transit needs. [place] Support Programs: @10RELATIVEDAYS @ > Cancer Support Group  2nd Tuesday of the month 1pm-2pm, Journey Room  > Creative Journey  3rd Tuesday of the month 1130am-1pm, Journey Room  > Look Good Feel Better  1st Wednesday of the month 10am-12 noon, Journey Room (Call [place] to register [phone])

## 2015-10-22 LAB — CANCER ANTIGEN 27.29: CA 27.29: 33.4 U/mL (ref 0.0–38.6)

## 2015-10-22 LAB — CANCER ANTIGEN 15-3: CANCER ANTIGEN-BREAST 15-3: 23 U/mL (ref <32)

## 2015-10-23 ENCOUNTER — Telehealth (HOSPITAL_COMMUNITY): Payer: Self-pay | Admitting: Oncology

## 2015-10-23 ENCOUNTER — Encounter (HOSPITAL_BASED_OUTPATIENT_CLINIC_OR_DEPARTMENT_OTHER): Payer: 59

## 2015-10-23 ENCOUNTER — Other Ambulatory Visit (HOSPITAL_COMMUNITY): Payer: Self-pay | Admitting: Oncology

## 2015-10-23 VITALS — BP 128/83 | HR 103 | Temp 98.2°F | Resp 18

## 2015-10-23 DIAGNOSIS — Z5111 Encounter for antineoplastic chemotherapy: Secondary | ICD-10-CM | POA: Diagnosis not present

## 2015-10-23 DIAGNOSIS — C7951 Secondary malignant neoplasm of bone: Secondary | ICD-10-CM | POA: Diagnosis not present

## 2015-10-23 DIAGNOSIS — C50919 Malignant neoplasm of unspecified site of unspecified female breast: Secondary | ICD-10-CM | POA: Diagnosis not present

## 2015-10-23 DIAGNOSIS — C7931 Secondary malignant neoplasm of brain: Secondary | ICD-10-CM | POA: Diagnosis not present

## 2015-10-23 MED ORDER — FULVESTRANT 250 MG/5ML IM SOLN
500.0000 mg | INTRAMUSCULAR | Status: DC
  Administered 2015-10-23: 500 mg via INTRAMUSCULAR

## 2015-10-23 MED ORDER — FULVESTRANT 250 MG/5ML IM SOLN
INTRAMUSCULAR | Status: AC
  Filled 2015-10-23: qty 10

## 2015-10-23 NOTE — Patient Instructions (Signed)
[place] at [facility] Discharge Instructions  RECOMMENDATIONS MADE BY THE CONSULTANT AND ANY TEST RESULTS WILL BE SENT TO YOUR REFERRING PHYSICIAN.  Faslodex injection today. Return as scheduled for injection. STOP your Arimidex.   Thank you for choosing [place] at [facility] to provide your oncology and hematology care.  To afford each patient quality time with our provider, please arrive at least 15 minutes before your scheduled appointment time.   Beginning January 23rd 2017 lab work for the [facility] will be done in the  Main lab at [facility] on 1st floor. If you have a lab appointment with the [place] please come in thru the  Main Entrance and check in at the main information desk  You need to re-schedule your appointment should you arrive 10 or more minutes late.  We strive to give you quality time with our providers, and arriving late affects you and other patients whose appointments are after yours.  Also, if you no show three or more times for appointments you may be dismissed from the clinic at the providers discretion.     Again, thank you for choosing [facility].  Our hope is that these requests will decrease the amount of time that you wait before being seen by our physicians.       _____________________________________________________________  Should you have questions after your visit to [facility], please contact our office at [phone] between the hours of 8:30 a.m. and 4:30 p.m.  Voicemails left after 4:30 p.m. will not be returned until the following business day.  For prescription refill requests, have your pharmacy contact our office.         Resources For Cancer Patients and their Caregivers ? American Cancer Society: Can assist with transportation, wigs, general needs, runs Look Good Feel Better.        719-797-5776 ? Cancer Care: Provides financial assistance,  online support groups, medication/co-pay assistance.  1-800-813-HOPE 972-800-2929) ? [place] Assists [place] cancer patients and their families through emotional , educational and financial support.  575 702 3972 ? [place] DSS Where to apply for food stamps, Medicaid and utility assistance. 330-608-1830 ? RCATS: Transportation to medical appointments. 502-046-9905 ? Social Security Administration: May apply for disability if have a Stage IV cancer. (785)826-7837 (702)253-9548 ? [employer], Disability and Transit Services: Assists with nutrition, care and transit needs. [place] Support Programs: @10RELATIVEDAYS @ > Cancer Support Group  2nd Tuesday of the month 1pm-2pm, Journey Room  > Creative Journey  3rd Tuesday of the month 1130am-1pm, Journey Room  > Look Good Feel Better  1st Wednesday of the month 10am-12 noon, Journey Room (Call [place] to register [phone])

## 2015-10-23 NOTE — Telephone Encounter (Signed)
Peer to peer required for Faslodex.  I have disdussed the patient's case with Dr. Carron Brazen.  This change in therapy is approved.  Approval # is [id number].  Jenai Scaletta, PA-C 10/23/2015 1:18 PM

## 2015-10-23 NOTE — Progress Notes (Signed)
1330:  [place] presents today for injection per the provider's orders.  Faslodex administration without incident; see MAR for injection details.  Patient tolerated procedure well and without incident.  No questions or complaints noted at this time.

## 2015-10-29 ENCOUNTER — Encounter (HOSPITAL_COMMUNITY): Payer: 59

## 2015-10-29 ENCOUNTER — Encounter: Payer: Self-pay | Admitting: *Deleted

## 2015-10-29 ENCOUNTER — Other Ambulatory Visit (HOSPITAL_COMMUNITY): Payer: 59

## 2015-10-29 ENCOUNTER — Ambulatory Visit (HOSPITAL_COMMUNITY): Payer: 59

## 2015-10-29 DIAGNOSIS — C229 Malignant neoplasm of liver, not specified as primary or secondary: Secondary | ICD-10-CM | POA: Diagnosis not present

## 2015-10-29 DIAGNOSIS — C50919 Malignant neoplasm of unspecified site of unspecified female breast: Secondary | ICD-10-CM

## 2015-10-29 DIAGNOSIS — R5382 Chronic fatigue, unspecified: Secondary | ICD-10-CM

## 2015-10-29 LAB — CBC WITH DIFFERENTIAL/PLATELET
BASOS ABS: 0 10*3/uL (ref 0.0–0.1)
BASOS PCT: 0 %
EOS ABS: 0.1 10*3/uL (ref 0.0–0.7)
Eosinophils Relative: 2 %
HCT: 35.2 % — ABNORMAL LOW (ref 36.0–46.0)
HEMOGLOBIN: 11.6 g/dL — AB (ref 12.0–15.0)
Lymphocytes Relative: 18 %
Lymphs Abs: 1 10*3/uL (ref 0.7–4.0)
MCH: 29.9 pg (ref 26.0–34.0)
MCHC: 33 g/dL (ref 30.0–36.0)
MCV: 90.7 fL (ref 78.0–100.0)
Monocytes Absolute: 0.3 10*3/uL (ref 0.1–1.0)
Monocytes Relative: 5 %
NEUTROS ABS: 4.2 10*3/uL (ref 1.7–7.7)
NEUTROS PCT: 75 %
Platelets: 223 10*3/uL (ref 150–400)
RBC: 3.88 MIL/uL (ref 3.87–5.11)
RDW: 14.3 % (ref 11.5–15.5)
WBC: 5.5 10*3/uL (ref 4.0–10.5)

## 2015-10-29 LAB — COMPREHENSIVE METABOLIC PANEL
ALBUMIN: 3.5 g/dL (ref 3.5–5.0)
ALK PHOS: 67 U/L (ref 38–126)
ALT: 18 U/L (ref 14–54)
ANION GAP: 10 (ref 5–15)
AST: 25 U/L (ref 15–41)
BUN: 9 mg/dL (ref 6–20)
CALCIUM: 8.5 mg/dL — AB (ref 8.9–10.3)
CO2: 24 mmol/L (ref 22–32)
Chloride: 105 mmol/L (ref 101–111)
Creatinine, Ser: 0.79 mg/dL (ref 0.44–1.00)
GFR calc Af Amer: >60 mL/min (ref >60)
GFR calc non Af Amer: >60 mL/min (ref >60)
GLUCOSE: 162 mg/dL — AB (ref 65–99)
Potassium: 3.7 mmol/L (ref 3.5–5.1)
SODIUM: 139 mmol/L (ref 135–145)
Total Bilirubin: 0.5 mg/dL (ref 0.3–1.2)
Total Protein: 6.6 g/dL (ref 6.5–8.1)

## 2015-10-29 NOTE — Progress Notes (Signed)
No x-geva today per Kirby Crigler PA-C  Calcium and albumin levels abnormal

## 2015-10-29 NOTE — Progress Notes (Signed)
[name] Clinical Social Work  Clinical Social Work was referred by [facility] rounding for assessment of psychosocial needs due to continued progression of cancer. Clinical Social Worker met with patient at [facility] to check in and reassess needs. Pt tearful today, ongoing issues with complex family dynamics. Supportive listening and encouragement offered. Pt attended Creative Journey Support group today and received support from peers. Pt encouraged to continue to reach out for support as needed.   Clinical Social Work interventions: Supportive listening Support group attendance  [name], [place] Tuesdays   [phone]

## 2015-10-30 LAB — CANCER ANTIGEN 15-3: CAN 15 3: 30.1 U/mL — AB (ref 0.0–25.0)

## 2015-10-30 LAB — CANCER ANTIGEN 27.29: CA 27.29: 30.8 U/mL (ref 0.0–38.6)

## 2015-11-04 ENCOUNTER — Other Ambulatory Visit (HOSPITAL_COMMUNITY): Payer: Self-pay | Admitting: Oncology

## 2015-11-04 ENCOUNTER — Encounter (HOSPITAL_COMMUNITY): Payer: Self-pay

## 2015-11-04 ENCOUNTER — Telehealth (HOSPITAL_COMMUNITY): Payer: Self-pay | Admitting: *Deleted

## 2015-11-04 ENCOUNTER — Encounter (HOSPITAL_COMMUNITY)
Admission: RE | Admit: 2015-11-04 | Discharge: 2015-11-04 | Disposition: A | Discharge status: Home / Self Care | Payer: 59 | Source: Ambulatory Visit | Attending: Oncology | Admitting: Oncology

## 2015-11-04 DIAGNOSIS — C7951 Secondary malignant neoplasm of bone: Secondary | ICD-10-CM

## 2015-11-04 DIAGNOSIS — F329 Major depressive disorder, single episode, unspecified: Secondary | ICD-10-CM

## 2015-11-04 DIAGNOSIS — C50919 Malignant neoplasm of unspecified site of unspecified female breast: Secondary | ICD-10-CM | POA: Diagnosis present

## 2015-11-04 DIAGNOSIS — F32A Depression, unspecified: Secondary | ICD-10-CM

## 2015-11-04 DIAGNOSIS — Z0000 Encounter for general adult medical examination without abnormal findings: Secondary | ICD-10-CM

## 2015-11-04 MED ORDER — HEPARIN SOD (PORK) LOCK FLUSH 100 UNIT/ML IV SOLN
INTRAVENOUS | Status: AC
  Filled 2015-11-04: qty 5

## 2015-11-04 MED ORDER — MORPHINE SULFATE ER 30 MG PO TBCR: 30.0000 mg | EXTENDED_RELEASE_TABLET | Freq: Two times a day (BID) | ORAL | Status: DC

## 2015-11-04 MED ORDER — TECHNETIUM TC 99M-LABELED RED BLOOD CELLS IV KIT
25.0000 | PACK | Freq: Once | INTRAVENOUS | Status: AC | PRN
  Administered 2015-11-04: 27 via INTRAVENOUS

## 2015-11-04 MED ORDER — ZOLPIDEM TARTRATE 10 MG PO TABS: 10.0000 mg | ORAL_TABLET | Freq: Every evening | ORAL | Status: DC | PRN

## 2015-11-04 MED ORDER — VENLAFAXINE HCL ER 75 MG PO CP24: ORAL_CAPSULE | ORAL | Status: DC

## 2015-11-06 ENCOUNTER — Encounter (HOSPITAL_COMMUNITY): Payer: Self-pay

## 2015-11-06 ENCOUNTER — Encounter (HOSPITAL_COMMUNITY): Payer: 59

## 2015-11-06 ENCOUNTER — Encounter (HOSPITAL_BASED_OUTPATIENT_CLINIC_OR_DEPARTMENT_OTHER): Payer: 59

## 2015-11-06 VITALS — BP 105/58 | HR 104 | Temp 98.4°F | Resp 18

## 2015-11-06 DIAGNOSIS — C50919 Malignant neoplasm of unspecified site of unspecified female breast: Secondary | ICD-10-CM | POA: Diagnosis not present

## 2015-11-06 DIAGNOSIS — R5382 Chronic fatigue, unspecified: Secondary | ICD-10-CM

## 2015-11-06 DIAGNOSIS — C229 Malignant neoplasm of liver, not specified as primary or secondary: Secondary | ICD-10-CM | POA: Diagnosis not present

## 2015-11-06 DIAGNOSIS — C7951 Secondary malignant neoplasm of bone: Secondary | ICD-10-CM | POA: Diagnosis not present

## 2015-11-06 DIAGNOSIS — Z5111 Encounter for antineoplastic chemotherapy: Secondary | ICD-10-CM | POA: Diagnosis not present

## 2015-11-06 DIAGNOSIS — C7931 Secondary malignant neoplasm of brain: Secondary | ICD-10-CM

## 2015-11-06 LAB — COMPREHENSIVE METABOLIC PANEL
ALT: 19 U/L (ref 14–54)
ANION GAP: 6 (ref 5–15)
AST: 22 U/L (ref 15–41)
Albumin: 3.8 g/dL (ref 3.5–5.0)
Alkaline Phosphatase: 74 U/L (ref 38–126)
BILIRUBIN TOTAL: 0.5 mg/dL (ref 0.3–1.2)
BUN: 9 mg/dL (ref 6–20)
CO2: 29 mmol/L (ref 22–32)
Calcium: 8.7 mg/dL — ABNORMAL LOW (ref 8.9–10.3)
Chloride: 102 mmol/L (ref 101–111)
Creatinine, Ser: 0.81 mg/dL (ref 0.44–1.00)
GFR calc Af Amer: >60 mL/min (ref >60)
Glucose, Bld: 141 mg/dL — ABNORMAL HIGH (ref 65–99)
POTASSIUM: 3.6 mmol/L (ref 3.5–5.1)
Sodium: 137 mmol/L (ref 135–145)
TOTAL PROTEIN: 7.3 g/dL (ref 6.5–8.1)

## 2015-11-06 MED ORDER — FULVESTRANT 250 MG/5ML IM SOLN
500.0000 mg | INTRAMUSCULAR | Status: DC
  Administered 2015-11-06: 500 mg via INTRAMUSCULAR

## 2015-11-06 MED ORDER — FULVESTRANT 250 MG/5ML IM SOLN
INTRAMUSCULAR | Status: AC
  Filled 2015-11-06: qty 5

## 2015-11-06 NOTE — Progress Notes (Signed)
[name]'s reason for visit today is for an injection  as scheduled per MD orders.    [name] also received faslodex per MD orders; see MAR for administration details.  [name] tolerated all procedures well and without incident; questions were answered and patient was discharged.

## 2015-11-06 NOTE — Patient Instructions (Signed)
[name] at [facility] Discharge Instructions  RECOMMENDATIONS MADE BY THE CONSULTANT AND ANY TEST RESULTS WILL BE SENT TO YOUR REFERRING PHYSICIAN.  faslodex today Follow up as scheduled Please call the clinic if you have any questions or concerns   Thank you for choosing [place] at [facility] to provide your oncology and hematology care.  To afford each patient quality time with our provider, please arrive at least 15 minutes before your scheduled appointment time.   Beginning January 23rd 2017 lab work for the [facility] will be done in the  Main lab at [facility] on 1st floor. If you have a lab appointment with the [place] please come in thru the  Main Entrance and check in at the main information desk  You need to re-schedule your appointment should you arrive 10 or more minutes late.  We strive to give you quality time with our providers, and arriving late affects you and other patients whose appointments are after yours.  Also, if you no show three or more times for appointments you may be dismissed from the clinic at the providers discretion.     Again, thank you for choosing [facility].  Our hope is that these requests will decrease the amount of time that you wait before being seen by our physicians.       _____________________________________________________________  Should you have questions after your visit to [facility], please contact our office at [phone] between the hours of 8:30 a.m. and 4:30 p.m.  Voicemails left after 4:30 p.m. will not be returned until the following business day.  For prescription refill requests, have your pharmacy contact our office.         Resources For Cancer Patients and their Caregivers ? American Cancer Society: Can assist with transportation, wigs, general needs, runs Look Good Feel Better.        830-514-6371 ? Cancer Care: Provides  financial assistance, online support groups, medication/co-pay assistance.  1-800-813-HOPE 765 720 4116) ? [employer] Assists [place] cancer patients and their families through emotional , educational and financial support.  (920) 056-5198 ? [place] DSS Where to apply for food stamps, Medicaid and utility assistance. (314)723-2048 ? RCATS: Transportation to medical appointments. 669-174-4984 ? Social Security Administration: May apply for disability if have a Stage IV cancer. 779-465-2446 534-434-1312 ? [employer], Disability and Transit Services: Assists with nutrition, care and transit needs. [place] Support Programs: @10RELATIVEDAYS @ > Cancer Support Group  2nd Tuesday of the month 1pm-2pm, Journey Room  > Creative Journey  3rd Tuesday of the month 1130am-1pm, Journey Room  > Look Good Feel Better  1st Wednesday of the month 10am-12 noon, Journey Room (Call [place] to register [phone])

## 2015-11-11 ENCOUNTER — Encounter (HOSPITAL_COMMUNITY): Payer: 59 | Attending: Oncology | Admitting: Oncology

## 2015-11-11 ENCOUNTER — Encounter (HOSPITAL_COMMUNITY): Payer: Self-pay | Admitting: Oncology

## 2015-11-11 ENCOUNTER — Inpatient Hospital Stay (HOSPITAL_COMMUNITY): Payer: 59

## 2015-11-11 ENCOUNTER — Encounter (HOSPITAL_BASED_OUTPATIENT_CLINIC_OR_DEPARTMENT_OTHER): Payer: 59

## 2015-11-11 VITALS — BP 109/58 | HR 73 | Temp 98.0°F | Resp 18 | Wt 248.8 lb

## 2015-11-11 DIAGNOSIS — C50919 Malignant neoplasm of unspecified site of unspecified female breast: Secondary | ICD-10-CM

## 2015-11-11 DIAGNOSIS — C229 Malignant neoplasm of liver, not specified as primary or secondary: Secondary | ICD-10-CM | POA: Insufficient documentation

## 2015-11-11 DIAGNOSIS — C7931 Secondary malignant neoplasm of brain: Secondary | ICD-10-CM

## 2015-11-11 DIAGNOSIS — Z5112 Encounter for antineoplastic immunotherapy: Secondary | ICD-10-CM | POA: Diagnosis not present

## 2015-11-11 DIAGNOSIS — C7951 Secondary malignant neoplasm of bone: Secondary | ICD-10-CM

## 2015-11-11 DIAGNOSIS — R110 Nausea: Secondary | ICD-10-CM

## 2015-11-11 MED ORDER — ACETAMINOPHEN 325 MG PO TABS
ORAL_TABLET | ORAL | Status: AC
  Filled 2015-11-11: qty 2

## 2015-11-11 MED ORDER — DIPHENHYDRAMINE HCL 25 MG PO CAPS
ORAL_CAPSULE | ORAL | Status: AC
  Filled 2015-11-11: qty 2

## 2015-11-11 MED ORDER — HEPARIN SOD (PORK) LOCK FLUSH 100 UNIT/ML IV SOLN: 500.0000 [IU] | Freq: Once | INTRAVENOUS | Status: AC | PRN

## 2015-11-11 MED ORDER — HEPARIN SOD (PORK) LOCK FLUSH 100 UNIT/ML IV SOLN
500.0000 [IU] | Freq: Once | INTRAVENOUS | Status: AC
  Administered 2015-11-11: 500 [IU] via INTRAVENOUS

## 2015-11-11 MED ORDER — DIPHENHYDRAMINE HCL 25 MG PO CAPS
50.0000 mg | ORAL_CAPSULE | Freq: Once | ORAL | Status: AC
  Administered 2015-11-11: 50 mg via ORAL

## 2015-11-11 MED ORDER — SODIUM CHLORIDE 0.9 % IV SOLN
8.0000 mg | Freq: Once | INTRAVENOUS | Status: AC
  Administered 2015-11-11: 8 mg via INTRAVENOUS
  Filled 2015-11-11: qty 4

## 2015-11-11 MED ORDER — SODIUM CHLORIDE 0.9 % IV SOLN
6.0000 mg/kg | Freq: Once | INTRAVENOUS | Status: AC
  Administered 2015-11-11: 672 mg via INTRAVENOUS
  Filled 2015-11-11: qty 32

## 2015-11-11 MED ORDER — SODIUM CHLORIDE 0.9 % IV SOLN
Freq: Once | INTRAVENOUS | Status: AC
  Administered 2015-11-11: 10:00:00 via INTRAVENOUS

## 2015-11-11 MED ORDER — SODIUM CHLORIDE 0.9 % IJ SOLN: 10.0000 mL | INTRAMUSCULAR | Status: DC | PRN

## 2015-11-11 MED ORDER — ACETAMINOPHEN 325 MG PO TABS
650.0000 mg | ORAL_TABLET | Freq: Once | ORAL | Status: AC
  Administered 2015-11-11: 650 mg via ORAL

## 2015-11-11 NOTE — Assessment & Plan Note (Addendum)
Stage IV adenocarcinoma of the breast, ER+, HER 2 + disease.  S/P BSO by Dr. Gaetano Net on 08/27/2015.  Oncology history is updated.  Current therapy is Faslodex/Herceptin/Zometa.  Zometa today.  Faslodex is due ~ 7/12 and then every 4 weeks thereafter.  Labs today: CBC diff, CMET, CA 27.29, and CA 15-3.  I personally reviewed and went over laboratory results with the patient.  The results are noted within this dictation.  Next MUGA is due at the end of August 2017 for a short-interval follow-up after her previous MUGA demonstrated a LVEF of 51%.  Order is placed.  She notes some "emotional sensitivity" as her husband calls it complicated by some nausea, hot flashes, and fatigue.  She is on Effexor.  She notes that it is likely multifactorial with the additional issues on top of another recent death in the family.  She will keep [place] in the loop regarding this as it may require adjunctive therapy.  She admits to some confusion regarding her treatment plan change.  Return in 3 weeks for follow-up.

## 2015-11-11 NOTE — Progress Notes (Signed)
No Zometa today due to low calcium level. Herceptin infusion tolerated without any problems.

## 2015-11-11 NOTE — Progress Notes (Signed)
Allison Labrum, MD Uncertain [id number]  Breast cancer, stage 4, unspecified laterality ([place])  CURRENT THERAPY: Herceptin/Faslodex/Zometa  INTERVAL HISTORY: [name] 50 y.o. female returns for followup of stage IV adenocarcinoma of breast (unknown primary), ER+, HER2 + disease.    Breast cancer, stage 4 ([place])   04/25/2014 Initial Diagnosis Breast cancer, stage 4   04/25/2014 Imaging CT abdomen/pelvia with widespread metastatic disease to the liver, multiple lytic lesions throughout spine and pelvis. No FX or epidural tumor identified   04/26/2014 Imaging CT head unremarkable   04/26/2014 Imaging CT chest with no lung mass or pulmonary nodules, no adenopathy. Lytic bone lesions, right 2nd rib   04/27/2014 Initial Biopsy U/S guided liver biopsy, lesion in anterior and inferior left hepatic lobe biopsied   04/27/2014 Pathology Results Metastatic adenocarcinoma, CK7, ER+, patchy positivity with PR. Possible primary includes breast, less likely gynecologic   05/15/2014 Mammogram BI-RADS CATEGORY  2: Benign Finding(s)   05/16/2014 PET scan 1. Intensely hypermetabolic hepatic metastasis. 2. Widespread hypermetabolic skeletal lesions. 3. No primary adenocarcinoma identified by FDG PET imaging.   05/19/2014 Imaging MUGA- Left ventricular ejection fracture greater than 70%.   05/21/2014 Breast MRI No suspicious masses or enhancement within the breasts. No axillary adenopathy.   05/22/2014 - 07/03/2014 Antibody Plan Herceptin/Perjeta/Tamoxifen   06/12/2014 - 07/03/2014 Chemotherapy Taxotere added secondary to persistent abdominal and back pain   06/17/2014 - 06/19/2014 Hospital Admission Neutropenia, fever, diarrhea, nausea, vomiting   06/20/2014 - 07/10/2014 Radiation Therapy Dr. Thea Silversmith 12 fractions to L3-S3 (30 Gy) and left scapula (20 Gy).    07/03/2014 Adverse Reaction Perjeta- induced diarrhea.  Perjeta discontinued   07/16/2014 - 07/20/2014 Hospital Admission Electrolyte  abnormalities, and diarrhea.  Suspect Perjeta-induced diarrhea.  Negative GI work-up.   07/24/2014 - 08/19/2015 Chemotherapy Herceptin/Tamoxifen/Xgeva   08/21/2014 Imaging MUGA- Left ventricular ejection fraction equals 71%.   08/24/2014 PET scan Dramatic reduction in metabolic activity of the widespread liver metastasis. Liver metastasis now have metabolic activity equal to background normal liver activity. Liver has a nodular contour. Marked reduction in metabolic activity of skeletal lesions..   10/05/2014 Progression Widespread metastatic disease to the brain as described. Between 20 and 30 intracranial metastatic deposits are now seen. No midline shift or incipient herniation   10/09/2014 - 10/26/2014 Radiation Therapy Whole Brain XRT   11/14/2014 Imaging MUGA- LVEF 67%   02/13/2015 Imaging MUGA- LVEF 59%   02/15/2015 Treatment Plan Change Due to declining LVEF, will hold Herceptin per PI guidelines.   04/12/2015 -  Chemotherapy Herceptin restarted   06/02/2015 - 06/08/2015 Hospital Admission Pneumonia   07/05/2015 Progression  PET/CT concern for mild progression of skeletal metastasis with several lesions within the spine and 1 lesion in the Left iliac wing with mild to moderate metabolic activity new from prior. Rising CA 27-29   07/16/2015 - 10/23/2015 Anti-estrogen oral therapy Arimidex   07/16/2015 Imaging MRI brain with satisfactory post treatment apperance of brain. interval resolved enhancing R caudate metastasis, minimal punctate residual enhancing metastatic disease at the inferior L cerebellum. No new metastatic disease or new intracranial abnormality   07/19/2015 Treatment Plan Change Discontinue Tamoxifen, Zoladex plus Arimidex.    08/27/2015 Procedure Laparoscopic bilateral salpingo-oophorectomy by Dr. Gaetano [name]   10/17/2015 PET scan Osseous metastatic disease appears slightly progressive based on a new right scapular lesion and increased uptake within lesions in the thoracic spine, left iliac wing and   proximal right femur.   10/17/2015  Progression Slight progression on PET scan imaging.   10/18/2015 Imaging REsolved enhancing metastatic disease to the brain status post WBXRT   10/23/2015 -  Adjuvant Chemotherapy Faslodex loading followed by maintenance dose.  (Herceptin continued)   11/04/2015 Imaging MUGA- LEFT ventricular ejection fraction 51% slightly decreased in a 57% on the previous exam.   11/11/2015 Treatment Plan Change Zometa every 28 days.  Xgeva discontinued.      She is doing well from an oncology perspective.  She admits to some confusion regarding her changes in her treatment plan.  As a result, we reviewed her treatment plan change in addition to the reasons regarding the changes.  She admits from more emotional lability which could be from Faslodex in addition to another recent death in her family.    Review of Systems  Constitutional: Negative for fever, chills and weight loss.  HENT: Negative.   Eyes: Negative.   Respiratory: Negative.   Cardiovascular: Negative.   Gastrointestinal: Positive for nausea. Negative for vomiting, diarrhea, constipation, blood in stool and melena.  Genitourinary: Negative.   Musculoskeletal: Negative.   Skin: Negative.   Neurological: Negative.  Negative for weakness.  Endo/Heme/Allergies: Negative.   Psychiatric/Behavioral: Positive for depression.    Past Medical History  Diagnosis Date  . Depression   . Anxiety   . Family history of prostate cancer   . Drug-induced cardiomyopathy ([place])     per last MUGA (08-08-2015), ef 56.5/ per last echo 05-18-2014 ef 60-65%  . PONV (postoperative nausea and vomiting)     pt states scope patch does well  . History of DVT (deep vein thrombosis)     07-09-2014  upper right extremity-  RIJ and right subclavian--  resolved  . Chronic pain syndrome     secondary to cancer   . GERD (gastroesophageal reflux disease)   . History of colon polyps     07-13-2013  benign  . History of gastritis     erosive   . History of radiation therapy     12 fractions to L3 - S3, 30Gy and left spacula 20Gy (06-20-2014 to 07-10-2014) //  whole brain rxt (10-09-2014 to 10-26-2014)  . History of small bowel obstruction     S/P RESECTION 2008  . Breast cancer, stage 4 [facility]) oncologist-  dr Larene [place] ([facility])    dx 12/ 2015 -- breast cancer Stage 4,  ER/HER2 +,  w/  liver, brain and  bone mets/  chemotherapy and radiation therapy  . Breast cancer metastasized to multiple sites [facility])     liver, brain, and bone  . Migraine   . History of pneumonia     HCAP 06-07-2015--  resolved per cxr 07-04-2015  . Anticoagulated     xarelto    Past Surgical History  Procedure Laterality Date  . Breast reduction surgery  03/17/2011    Procedure: MAMMARY REDUCTION BILATERAL (BREAST);  Surgeon: Mary A Contogiannis;  Location: [place];  Service: Plastics;  Laterality: Bilateral;  . Colonoscopy N/A 07/13/2013    Procedure: COLONOSCOPY;  Surgeon: Rogene Houston, MD;  [facility];  Service: Endoscopy;  Laterality: N/A;  930  . Portacath placement  05-17-2014  . Esophagogastroduodenoscopy N/A 05/25/2014    Procedure: ESOPHAGOGASTRODUODENOSCOPY (EGD);  Surgeon: Rogene Houston, MD;  [facility];  Service: Endoscopy;  Laterality: N/A;  155  . Colonoscopy N/A 11/26/2014    Procedure: COLONOSCOPY;  Surgeon: Rogene Houston, MD;  [facility];  Service: Endoscopy;  Laterality: N/A;  730  . Shoulder arthroscopy with rotator cuff repair Right 2002  . Cervical fusion  2003    C5 -- C6  . Knee arthroscopy Right 2005  . Laparoscopic cholecystectomy  11-17-2002  . Dx laparoscopy w/ partial small bowel resection and appendectomy  04-13-2007  . Cataract extraction w/ intraocular lens  implant, bilateral  2008  . Laparoscopic assisted vaginal hysterectomy  10-13-2010    w/ Bx Left Fallopian tube and Aspiration Right Ovarian Cyst  . D & c hysteroscopy/  resection endometrial  mass/  novasure endometrial ablation  04-11-2010  . Transthoracic echocardiogram  05-18-2014    ef 60-65%//   last MUGA  (08-08-2015)  ef 56.6%    . Laparoscopic salpingo oopherectomy Bilateral 08/26/2015    Procedure: LAPAROSCOPIC SALPINGO OOPHORECTOMY, bilateral;  Surgeon: Everlene Farrier, MD;  Location: [place];  Service: Gynecology;  Laterality: Bilateral;    Family History  Problem Relation Age of Onset  . Diabetes Father   . Heart attack Maternal Grandmother 30    multiple over lifetime.  . Cancer Maternal Grandmother 9    NOS  . Prostate cancer Maternal Grandfather     dx in his 26s  . Lung cancer Paternal Grandfather     dx <50  . Lymphoma Maternal Aunt     dx in her 42s  . Melanoma Cousin 59    maternal first cousin  . Brain cancer Cousin     paternal first cousin dx under 65  . Prostate cancer Other     MGF's father  . Colon cancer Other     MGM's mother    Social History   Social History  . Marital Status: Married    Spouse Name: N/A  . Number of Children: N/A  . Years of Education: N/A   Social History Main Topics  . Smoking status: Former Smoker    Types: Cigarettes    Quit date: 08/20/1994  . Smokeless tobacco: Never Used  . Alcohol Use: Yes     Comment: Occasionally  . Drug Use: No  . Sexual Activity: Not on file   Other Topics Concern  . Not on file   Social History Narrative     PHYSICAL EXAMINATION  ECOG PERFORMANCE STATUS: 0 - Asymptomatic  There were no vitals filed for this visit.  BP 109/58 P 73 T 98 R 18  GENERAL:alert, no distress, well nourished, well developed, comfortable, cooperative, obese, smiling and accompanied by her husband SKIN: skin color, texture, turgor are normal, no rashes or significant lesions HEAD: Normocephalic, No masses, lesions, tenderness or abnormalities EYES: normal, EOMI, Conjunctiva are pink and non-injected EARS: External ears normal OROPHARYNX:lips, buccal mucosa, and tongue  normal and mucous membranes are moist  NECK: supple, trachea midline LYMPH:  not examined BREAST:not examined LUNGS: clear to auscultation  HEART: regular rate & rhythm, no murmurs, no gallops, S1 normal and S2 normal ABDOMEN:abdomen soft, non-tender and normal bowel sounds BACK: Back symmetric, no curvature. EXTREMITIES:less then 2 second capillary refill, no joint deformities, effusion, or inflammation, no skin discoloration, no cyanosis  NEURO: alert & oriented x 3 with fluent speech, no focal motor/sensory deficits, gait normal   LABORATORY DATA: CBC    Component Value Date/Time   WBC 5.5 10/29/2015 1104   RBC 3.88 10/29/2015 1104   HGB 11.6* 10/29/2015 1104   HCT 35.2* 10/29/2015 1104   PLT 223 10/29/2015 1104   MCV 90.7 10/29/2015 1104  MCH 29.9 10/29/2015 1104   MCHC 33.0 10/29/2015 1104   RDW 14.3 10/29/2015 1104   LYMPHSABS 1.0 10/29/2015 1104   MONOABS 0.3 10/29/2015 1104   EOSABS 0.1 10/29/2015 1104   BASOSABS 0.0 10/29/2015 1104      Chemistry      Component Value Date/Time   NA 137 11/06/2015 1042   K 3.6 11/06/2015 1042   CL 102 11/06/2015 1042   CO2 29 11/06/2015 1042   BUN 9 11/06/2015 1042   CREATININE 0.81 11/06/2015 1042      Component Value Date/Time   CALCIUM 8.7* 11/06/2015 1042   ALKPHOS 74 11/06/2015 1042   AST 22 11/06/2015 1042   ALT 19 11/06/2015 1042   BILITOT 0.5 11/06/2015 1042        PENDING LABS:   RADIOGRAPHIC STUDIES:  Mr Jeri Cos OI Contrast  10/18/2015  CLINICAL DATA:  50 year old female with stage IV breast cancer status post whole brain radiation in May and June of 2016. Restaging. Subsequent encounter. EXAM: MRI HEAD WITHOUT AND WITH CONTRAST TECHNIQUE: Multiplanar, multiecho pulse sequences of the brain and surrounding structures were obtained without and with intravenous contrast. CONTRAST:  14m MULTIHANCE GADOBENATE DIMEGLUMINE 529 MG/ML IV SOLN COMPARISON:  07/16/2015 brain MRI and earlier. FINDINGS: Resolved  residual punctate enhancing focus in the inferior left cerebellum since March. No residual enhancing brain metastases today. No dural thickening. Stable small residual focus of T2 hyperintensity at the site of the treated right caudate nucleus metastasis (series 5, image 12). No restricted diffusion to suggest acute infarction. No midline shift, mass effect, evidence of mass lesion, ventriculomegaly, extra-axial collection or acute intracranial hemorrhage. Cervicomedullary junction within normal limits. Negative pituitary. Major intracranial vascular flow voids are stable. Vague periventricular white matter T2 and FLAIR hyperintensity has mildly increased. Other occasional small nonspecific cerebral white matter foci are stable. No other new signal abnormality in the brain. Negative visualized cervical spine and spinal cord. Visible bone marrow signal is stable with no suspicious osseous lesion. Mastoid effusions have regressed. Visible internal auditory structures appear normal. Paranasal sinuses are stable and well pneumatized. Stable and negative orbit and scalp soft tissue. .Marland KitchenIMPRESSION: 1. Resolved enhancing metastatic disease to the brain status post whole brain radiation in 1 year ago. Mild post radiation white matter changes. 2. No new intracranial abnormality or metastatic process identified. Electronically Signed   By: HGenevie AnnM.D.   On: 10/18/2015 12:10   Nm Cardiac Muga Rest  11/04/2015  CLINICAL DATA:  Stage IV breast cancer, Herceptin therapy EXAM: NUCLEAR MEDICINE CARDIAC BLOOD POOL IMAGING (MUGA) TECHNIQUE: Cardiac multi-gated acquisition was performed at rest following intravenous injection of Tc-972mabeled red blood cells. RADIOPHARMACEUTICALS:  27 mCi Tc-9936m-vitro labeled autologous red blood cells IV COMPARISON:  08/08/2015 FINDINGS: LEFT ventricular ejection fraction is calculated at 51%, slightly decreased from the 57% on the previous exam. Wall motion analysis of the LEFT ventricle in 3  projections is normal; no focal LEFT ventricular wall motion abnormalities identified. IMPRESSION: LEFT ventricular ejection fraction 51% slightly decreased in a 57% on the previous exam. No focal wall motion abnormalities. Electronically Signed   By: MarLavonia DanaD.   On: 11/04/2015 14:19   Nm Pet Image Restag (ps) Skull Base To Thigh  10/17/2015  CLINICAL DATA:  Subsequent treatment strategy for breast cancer. EXAM: NUCLEAR MEDICINE PET SKULL BASE TO THIGH TECHNIQUE: 12.1 mCi F-18 FDG was injected intravenously. Full-ring PET imaging was performed from the skull base to thigh after the  radiotracer. CT data was obtained and used for attenuation correction and anatomic localization. FASTING BLOOD GLUCOSE:  Value: 96 Mg/dl COMPARISON:  07/05/2015. FINDINGS: NECK No hypermetabolic lymph nodes in the neck. CT images show no acute findings. CHEST Focal hypermetabolism is seen within a soft tissue lesion inferior to the medial left clavicle, measuring 1.7 cm (CT image 47) with an SUV max of 7.6. No hypermetabolic mediastinal, hilar or axillary lymph nodes. No hypermetabolic pulmonary nodules. CT images show a right IJ Port-A-Cath terminating at the SVC RA junction. No pericardial or pleural effusion. ABDOMEN/PELVIS There is focal hypermetabolism along the ventral abdominal wall, deep to the umbilicus, with there is slight soft tissue thickening when compared with 07/05/2015. Associated SUV max of 6.1. No abnormal hypermetabolism in the liver, adrenal glands, spleen or pancreas. No hypermetabolic lymph nodes. CT images show irregularity of the liver margin, which can be seen with fibrosis related to treated metastases. Cholecystectomy. Adrenal glands, kidneys, spleen, pancreas and stomach are unremarkable. Postoperative changes are seen in the small bowel. SKELETON New focal hypermetabolism is seen in the right scapula with an SUV max of 6.7. A few lesions appear to have increased FDG avidity. For example, mid thoracic  spine lesion has an SUV max of 15.8, compared with 5.8 previously. Left iliac wing lesion has an SUV max of 13.4, previously 5.5. Right femoral neck lesion has an SUV max of 11.5, previously 6.8. Additional scattered lesions have similar activity or no activity. IMPRESSION: 1. Osseous metastatic disease appears slightly progressive based on a new right scapular lesion and increased uptake within lesions in the thoracic spine, left iliac wing and proximal right femur. 2. Hypermetabolic soft tissue lesion inferior to the medial left clavicle, new. Difficult to exclude metastatic disease. 3. Focal hypermetabolism associated with soft tissue thickening deep to the umbilicus, nonspecific. Continued attention on followup exams is warranted. Electronically Signed   By: Lorin Picket M.D.   On: 10/17/2015 12:22     PATHOLOGY:    ASSESSMENT AND PLAN:  Breast cancer, stage 4 (H[facility]) Stage IV adenocarcinoma of the breast, ER+, HER 2 + disease.  S/P BSO by Dr. Gaetano [name] on 08/27/2015.  Oncology history is updated.  Current therapy is Faslodex/Herceptin/Zometa.  Zometa today.  Faslodex is due ~ 7/12 and then every 4 weeks thereafter.  Labs today: CBC diff, CMET, CA 27.29, and CA 15-3.  I personally reviewed and went over laboratory results with the patient.  The results are noted within this dictation.  Next MUGA is due at the end of August 2017 for a short-interval follow-up after her previous MUGA demonstrated a LVEF of 51%.  Order is placed.  She notes some "emotional sensitivity" as her husband calls it complicated by some nausea, hot flashes, and fatigue.  She is on Effexor.  She notes that it is likely multifactorial with the additional issues on top of another recent death in the family.  She will keep [place] in the loop regarding this as it may require adjunctive therapy.  She admits to some confusion regarding her treatment plan change.  Return in 3 weeks for follow-up.    ORDERS PLACED FOR THIS  ENCOUNTER: No orders of the defined types were placed in this encounter.    MEDICATIONS PRESCRIBED THIS ENCOUNTER: No orders of the defined types were placed in this encounter.    THERAPY PLAN:  Continue treatment as outlined above.  All questions were answered. The patient knows to call the clinic with any problems, questions or concerns.  We can certainly see the patient much sooner if necessary.  Patient and plan discussed with Dr. Ancil Linsey and she is in agreement with the aforementioned.   This note is electronically signed by: Doy Mince 11/11/2015 6:42 PM

## 2015-11-20 ENCOUNTER — Encounter (HOSPITAL_BASED_OUTPATIENT_CLINIC_OR_DEPARTMENT_OTHER): Payer: 59

## 2015-11-20 VITALS — BP 126/68 | HR 85 | Resp 16

## 2015-11-20 DIAGNOSIS — C50919 Malignant neoplasm of unspecified site of unspecified female breast: Secondary | ICD-10-CM

## 2015-11-20 DIAGNOSIS — Z5111 Encounter for antineoplastic chemotherapy: Secondary | ICD-10-CM | POA: Diagnosis not present

## 2015-11-20 DIAGNOSIS — C7951 Secondary malignant neoplasm of bone: Secondary | ICD-10-CM

## 2015-11-20 DIAGNOSIS — C7931 Secondary malignant neoplasm of brain: Secondary | ICD-10-CM | POA: Diagnosis not present

## 2015-11-20 MED ORDER — FULVESTRANT 250 MG/5ML IM SOLN
500.0000 mg | INTRAMUSCULAR | Status: DC
  Administered 2015-11-20: 500 mg via INTRAMUSCULAR

## 2015-11-20 MED ORDER — FULVESTRANT 250 MG/5ML IM SOLN
INTRAMUSCULAR | Status: AC
  Filled 2015-11-20: qty 5

## 2015-11-20 NOTE — Patient Instructions (Signed)
[place] at [facility] Discharge Instructions  RECOMMENDATIONS MADE BY THE CONSULTANT AND ANY TEST RESULTS WILL BE SENT TO YOUR REFERRING PHYSICIAN.  Faslodex given today. Follow up as scheduled Call for any concerns or questions  Thank you for choosing [place] at [facility] to provide your oncology and hematology care.  To afford each patient quality time with our provider, please arrive at least 15 minutes before your scheduled appointment time.   Beginning January 23rd 2017 lab work for the [facility] will be done in the  Main lab at [facility] on 1st floor. If you have a lab appointment with the [facility] please come in thru the  Main Entrance and check in at the main information desk  You need to re-schedule your appointment should you arrive 10 or more minutes late.  We strive to give you quality time with our providers, and arriving late affects you and other patients whose appointments are after yours.  Also, if you no show three or more times for appointments you may be dismissed from the clinic at the providers discretion.     Again, thank you for choosing [facility]  Our hope is that these requests will decrease the amount of time that you wait before being seen by our physicians.       _____________________________________________________________  Should you have questions after your visit to [facility], please contact our office at [phone] between the hours of 8:30 a.m. and 4:30 p.m.  Voicemails left after 4:30 p.m. will not be returned until the following business day.  For prescription refill requests, have your pharmacy contact our office.         Resources For Cancer Patients and their Caregivers ? American Cancer Society: Can assist with transportation, wigs, general needs, runs Look Good Feel Better.        640-425-8095 ? Cancer Care: Provides financial assistance,  online support groups, medication/co-pay assistance.  1-800-813-HOPE (585) 151-6968) ? [place] Assists [place] cancer patients and their families through emotional , educational and financial support.  701-650-5601 ? [place] DSS Where to apply for food stamps, Medicaid and utility assistance. 979-854-9075 ? RCATS: Transportation to medical appointments. 820 130 8818 ? Social Security Administration: May apply for disability if have a Stage IV cancer. 762-077-1397 508-364-6587 ? [employer], Disability and Transit Services: Assists with nutrition, care and transit needs. [place] Support Programs: @10RELATIVEDAYS @ > Cancer Support Group  2nd Tuesday of the month 1pm-2pm, Journey Room  > Creative Journey  3rd Tuesday of the month 1130am-1pm, Journey Room  > Look Good Feel Better  1st Wednesday of the month 10am-12 noon, Journey Room (Call [place] to register [phone])

## 2015-11-20 NOTE — Progress Notes (Signed)
[name] presents today for injection per MD orders. Faslodex 500 mg administered IM in the right and left upper buttocks Administration without incident. Patient tolerated well.

## 2015-12-02 ENCOUNTER — Ambulatory Visit (HOSPITAL_COMMUNITY): Payer: 59 | Admitting: Hematology & Oncology

## 2015-12-02 ENCOUNTER — Encounter (HOSPITAL_COMMUNITY): Payer: Self-pay

## 2015-12-02 ENCOUNTER — Other Ambulatory Visit (HOSPITAL_COMMUNITY): Payer: Self-pay | Admitting: Oncology

## 2015-12-02 ENCOUNTER — Encounter (HOSPITAL_BASED_OUTPATIENT_CLINIC_OR_DEPARTMENT_OTHER): Payer: 59

## 2015-12-02 VITALS — BP 127/69 | HR 72 | Temp 98.2°F | Resp 18 | Wt 247.0 lb

## 2015-12-02 DIAGNOSIS — C229 Malignant neoplasm of liver, not specified as primary or secondary: Secondary | ICD-10-CM

## 2015-12-02 DIAGNOSIS — C7951 Secondary malignant neoplasm of bone: Secondary | ICD-10-CM | POA: Diagnosis not present

## 2015-12-02 DIAGNOSIS — Z0000 Encounter for general adult medical examination without abnormal findings: Secondary | ICD-10-CM

## 2015-12-02 DIAGNOSIS — C7931 Secondary malignant neoplasm of brain: Secondary | ICD-10-CM | POA: Diagnosis not present

## 2015-12-02 DIAGNOSIS — Z5112 Encounter for antineoplastic immunotherapy: Secondary | ICD-10-CM

## 2015-12-02 DIAGNOSIS — C50919 Malignant neoplasm of unspecified site of unspecified female breast: Secondary | ICD-10-CM | POA: Diagnosis not present

## 2015-12-02 DIAGNOSIS — R5382 Chronic fatigue, unspecified: Secondary | ICD-10-CM

## 2015-12-02 LAB — COMPREHENSIVE METABOLIC PANEL
ALT: 16 U/L (ref 14–54)
AST: 18 U/L (ref 15–41)
Albumin: 3.8 g/dL (ref 3.5–5.0)
Alkaline Phosphatase: 71 U/L (ref 38–126)
Anion gap: 6 (ref 5–15)
BUN: 13 mg/dL (ref 6–20)
CALCIUM: 8.7 mg/dL — AB (ref 8.9–10.3)
CHLORIDE: 103 mmol/L (ref 101–111)
CO2: 28 mmol/L (ref 22–32)
Creatinine, Ser: 0.74 mg/dL (ref 0.44–1.00)
GFR calc Af Amer: >60 mL/min (ref >60)
GFR calc non Af Amer: >60 mL/min (ref >60)
GLUCOSE: 126 mg/dL — AB (ref 65–99)
POTASSIUM: 3.6 mmol/L (ref 3.5–5.1)
SODIUM: 137 mmol/L (ref 135–145)
Total Bilirubin: 0.4 mg/dL (ref 0.3–1.2)
Total Protein: 7.1 g/dL (ref 6.5–8.1)

## 2015-12-02 LAB — CBC WITH DIFFERENTIAL/PLATELET
BASOS PCT: 0 %
Basophils Absolute: 0 10*3/uL (ref 0.0–0.1)
Eosinophils Absolute: 0.2 10*3/uL (ref 0.0–0.7)
Eosinophils Relative: 3 %
HEMATOCRIT: 39.1 % (ref 36.0–46.0)
Hemoglobin: 12.4 g/dL (ref 12.0–15.0)
LYMPHS PCT: 16 %
Lymphs Abs: 1 10*3/uL (ref 0.7–4.0)
MCH: 28.8 pg (ref 26.0–34.0)
MCHC: 31.7 g/dL (ref 30.0–36.0)
MCV: 90.7 fL (ref 78.0–100.0)
MONO ABS: 0.4 10*3/uL (ref 0.1–1.0)
MONOS PCT: 6 %
NEUTROS ABS: 4.5 10*3/uL (ref 1.7–7.7)
Neutrophils Relative %: 75 %
Platelets: 201 10*3/uL (ref 150–400)
RBC: 4.31 MIL/uL (ref 3.87–5.11)
RDW: 14.3 % (ref 11.5–15.5)
WBC: 6.1 10*3/uL (ref 4.0–10.5)

## 2015-12-02 MED ORDER — HEPARIN SOD (PORK) LOCK FLUSH 100 UNIT/ML IV SOLN
500.0000 [IU] | Freq: Once | INTRAVENOUS | Status: AC | PRN
  Administered 2015-12-02: 500 [IU]

## 2015-12-02 MED ORDER — HEPARIN SOD (PORK) LOCK FLUSH 100 UNIT/ML IV SOLN
INTRAVENOUS | Status: AC
  Filled 2015-12-02: qty 15

## 2015-12-02 MED ORDER — TRASTUZUMAB CHEMO INJECTION 440 MG
6.0000 mg/kg | Freq: Once | INTRAVENOUS | Status: AC
  Administered 2015-12-02: 672 mg via INTRAVENOUS
  Filled 2015-12-02: qty 32

## 2015-12-02 MED ORDER — SODIUM CHLORIDE 0.9 % IV SOLN
Freq: Once | INTRAVENOUS | Status: AC
  Administered 2015-12-02: 11:00:00 via INTRAVENOUS

## 2015-12-02 MED ORDER — ZOLPIDEM TARTRATE 10 MG PO TABS: 10.0000 mg | ORAL_TABLET | Freq: Every evening | ORAL | 2 refills | Status: DC | PRN

## 2015-12-02 MED ORDER — DIPHENHYDRAMINE HCL 25 MG PO CAPS
50.0000 mg | ORAL_CAPSULE | Freq: Once | ORAL | Status: AC
  Administered 2015-12-02: 50 mg via ORAL

## 2015-12-02 MED ORDER — DIPHENHYDRAMINE HCL 25 MG PO CAPS
ORAL_CAPSULE | ORAL | Status: AC
  Filled 2015-12-02: qty 2

## 2015-12-02 MED ORDER — MORPHINE SULFATE ER 30 MG PO TBCR: 30.0000 mg | EXTENDED_RELEASE_TABLET | Freq: Two times a day (BID) | ORAL | 0 refills | Status: DC

## 2015-12-02 MED ORDER — ACETAMINOPHEN 325 MG PO TABS
ORAL_TABLET | ORAL | Status: AC
  Filled 2015-12-02: qty 2

## 2015-12-02 MED ORDER — ACETAMINOPHEN 325 MG PO TABS
650.0000 mg | ORAL_TABLET | Freq: Once | ORAL | Status: AC
  Administered 2015-12-02: 650 mg via ORAL

## 2015-12-02 MED ORDER — METHYLPHENIDATE HCL 10 MG PO TABS: ORAL_TABLET | ORAL | 0 refills | Status: DC

## 2015-12-02 NOTE — Progress Notes (Signed)
1250:  Tolerated infusion w/o adverse reaction.  VSS.  A&Ox4, in no distress.  Discharged ambulatory in c/o spouse.

## 2015-12-02 NOTE — Patient Instructions (Signed)
[facility] Discharge Instructions for Patients Receiving Chemotherapy   Beginning January 23rd 2017 lab work for the [facility] will be done in the  Main lab at [facility] on 1st floor. If you have a lab appointment with the [place] please come in thru the  Main Entrance and check in at the main information desk   Today you received the following chemotherapy agents:  Herceptin  Zometa infusion deferred until 12/24/15 due to low calcium. Continue taking your Calcium + Vit D as prescribed, but also add Tums three times a day. Return as scheduled for Faslodex injection. Return as scheduled for Herceptin.   If you develop nausea and vomiting, or diarrhea that is not controlled by your medication, call the clinic.  The clinic phone number is [phone]. Office hours are Monday-Friday 8:30am-5:00pm.  BELOW ARE SYMPTOMS THAT SHOULD BE REPORTED IMMEDIATELY:  *FEVER GREATER THAN 101.0 F  *CHILLS WITH OR WITHOUT FEVER  NAUSEA AND VOMITING THAT IS NOT CONTROLLED WITH YOUR NAUSEA MEDICATION  *UNUSUAL SHORTNESS OF BREATH  *UNUSUAL BRUISING OR BLEEDING  TENDERNESS IN MOUTH AND THROAT WITH OR WITHOUT PRESENCE OF ULCERS  *URINARY PROBLEMS  *BOWEL PROBLEMS  UNUSUAL RASH Items with * indicate a potential emergency and should be followed up as soon as possible. If you have an emergency after office hours please contact your primary care physician or go to the nearest emergency department.  Please call the clinic during office hours if you have any questions or concerns.   You may also contact the Patient Navigator at [phone] should you have any questions or need assistance in obtaining follow up care.      Resources For Cancer Patients and their Caregivers ? American Cancer Society: Can assist with transportation, wigs, general needs, runs Look Good Feel Better.        712-604-7558 ? Cancer Care: Provides financial assistance, online support  groups, medication/co-pay assistance.  1-800-813-HOPE (605)160-1580) ? [facility] Assists [place] cancer patients and their families through emotional , educational and financial support.  803 575 4384 ? [place] DSS Where to apply for food stamps, Medicaid and utility assistance. (989) 476-2374 ? RCATS: Transportation to medical appointments. 7574806014 ? Social Security Administration: May apply for disability if have a Stage IV cancer. 740-490-1412 520 114 8904 ? [employer], Disability and Transit Services: Assists with nutrition, care and transit needs. 641-533-7420

## 2015-12-03 LAB — CANCER ANTIGEN 27.29: CA 27.29: 30.2 U/mL (ref 0.0–38.6)

## 2015-12-03 LAB — CANCER ANTIGEN 15-3: CAN 15 3: 31.9 U/mL — AB (ref 0.0–25.0)

## 2015-12-04 ENCOUNTER — Ambulatory Visit (HOSPITAL_COMMUNITY): Payer: 59

## 2015-12-09 ENCOUNTER — Ambulatory Visit (HOSPITAL_COMMUNITY): Payer: 59

## 2015-12-12 ENCOUNTER — Encounter (HOSPITAL_COMMUNITY): Payer: Self-pay | Admitting: Oncology

## 2015-12-18 ENCOUNTER — Other Ambulatory Visit (HOSPITAL_COMMUNITY): Payer: Self-pay | Admitting: Oncology

## 2015-12-18 ENCOUNTER — Encounter (HOSPITAL_COMMUNITY): Payer: Self-pay

## 2015-12-18 ENCOUNTER — Encounter (HOSPITAL_COMMUNITY): Payer: 59 | Attending: Oncology

## 2015-12-18 VITALS — BP 117/70 | HR 102 | Temp 97.9°F | Wt 247.0 lb

## 2015-12-18 DIAGNOSIS — C50919 Malignant neoplasm of unspecified site of unspecified female breast: Secondary | ICD-10-CM

## 2015-12-18 DIAGNOSIS — Z5111 Encounter for antineoplastic chemotherapy: Secondary | ICD-10-CM | POA: Diagnosis not present

## 2015-12-18 DIAGNOSIS — C7931 Secondary malignant neoplasm of brain: Secondary | ICD-10-CM

## 2015-12-18 DIAGNOSIS — C7951 Secondary malignant neoplasm of bone: Secondary | ICD-10-CM | POA: Insufficient documentation

## 2015-12-18 DIAGNOSIS — C229 Malignant neoplasm of liver, not specified as primary or secondary: Secondary | ICD-10-CM | POA: Insufficient documentation

## 2015-12-18 MED ORDER — FULVESTRANT 250 MG/5ML IM SOLN
500.0000 mg | INTRAMUSCULAR | Status: DC
  Administered 2015-12-18: 500 mg via INTRAMUSCULAR

## 2015-12-18 NOTE — Progress Notes (Signed)
[name] presents today for injection per MD orders. FASLODEX 500mg  administered IM in Left and Right Outer Upper Quadrants. Administration without incident. Patient tolerated well.

## 2015-12-18 NOTE — Patient Instructions (Signed)
[place] at [facility] Discharge Instructions  RECOMMENDATIONS MADE BY THE CONSULTANT AND ANY TEST RESULTS WILL BE SENT TO YOUR REFERRING PHYSICIAN.  You were given FASLODEX per orders. Follow up as needed.  Thank you for choosing [place] at [facility] to provide your oncology and hematology care.  To afford each patient quality time with our provider, please arrive at least 15 minutes before your scheduled appointment time.   Beginning January 23rd 2017 lab work for the [facility] will be done in the  Main lab at [facility] on 1st floor. If you have a lab appointment with the [facility] please come in thru the  Main Entrance and check in at the main information desk  You need to re-schedule your appointment should you arrive 10 or more minutes late.  We strive to give you quality time with our providers, and arriving late affects you and other patients whose appointments are after yours.  Also, if you no show three or more times for appointments you may be dismissed from the clinic at the providers discretion.     Again, thank you for choosing [facility].  Our hope is that these requests will decrease the amount of time that you wait before being seen by our physicians.       _____________________________________________________________  Should you have questions after your visit to [facility], please contact our office at [phone] between the hours of 8:30 a.m. and 4:30 p.m.  Voicemails left after 4:30 p.m. will not be returned until the following business day.  For prescription refill requests, have your pharmacy contact our office.         Resources For Cancer Patients and their Caregivers ? American Cancer Society: Can assist with transportation, wigs, general needs, runs Look Good Feel Better.        440-520-7384 ? Cancer Care: Provides financial assistance, online support groups,  medication/co-pay assistance.  1-800-813-HOPE 780-786-4445) ? [place] Assists [place] cancer patients and their families through emotional , educational and financial support.  [phone] ? [place] DSS Where to apply for food stamps, Medicaid and utility assistance. 213-019-1332 ? RCATS: Transportation to medical appointments. 807-686-8147 ? Social Security Administration: May apply for disability if have a Stage IV cancer. [phone])583-9423 [phone])337-2293 ? [employer], Disability and Transit Services: Assists with nutrition, care and transit needs. [place] Support Programs: @10RELATIVEDAYS @ > Cancer Support Group  2nd Tuesday of the month 1pm-2pm, Journey Room  > Creative Journey  3rd Tuesday of the month 1130am-1pm, Journey Room  > Look Good Feel Better  1st Wednesday of the month 10am-12 noon, Journey Room (Call [place] to register [phone])

## 2015-12-23 ENCOUNTER — Inpatient Hospital Stay (HOSPITAL_COMMUNITY): Payer: 59

## 2015-12-24 ENCOUNTER — Encounter (HOSPITAL_BASED_OUTPATIENT_CLINIC_OR_DEPARTMENT_OTHER): Payer: 59 | Admitting: Oncology

## 2015-12-24 ENCOUNTER — Encounter (HOSPITAL_BASED_OUTPATIENT_CLINIC_OR_DEPARTMENT_OTHER): Payer: 59

## 2015-12-24 ENCOUNTER — Encounter (HOSPITAL_COMMUNITY): Payer: Self-pay | Admitting: Oncology

## 2015-12-24 ENCOUNTER — Encounter (HOSPITAL_COMMUNITY): Payer: 59

## 2015-12-24 DIAGNOSIS — C50919 Malignant neoplasm of unspecified site of unspecified female breast: Secondary | ICD-10-CM

## 2015-12-24 DIAGNOSIS — C7951 Secondary malignant neoplasm of bone: Secondary | ICD-10-CM | POA: Diagnosis not present

## 2015-12-24 DIAGNOSIS — Z5112 Encounter for antineoplastic immunotherapy: Secondary | ICD-10-CM

## 2015-12-24 DIAGNOSIS — C7931 Secondary malignant neoplasm of brain: Secondary | ICD-10-CM

## 2015-12-24 DIAGNOSIS — C229 Malignant neoplasm of liver, not specified as primary or secondary: Secondary | ICD-10-CM | POA: Diagnosis present

## 2015-12-24 LAB — COMPREHENSIVE METABOLIC PANEL
ALT: 18 U/L (ref 14–54)
AST: 21 U/L (ref 15–41)
Albumin: 3.7 g/dL (ref 3.5–5.0)
Alkaline Phosphatase: 68 U/L (ref 38–126)
Anion gap: 7 (ref 5–15)
BILIRUBIN TOTAL: 0.4 mg/dL (ref 0.3–1.2)
BUN: 9 mg/dL (ref 6–20)
CHLORIDE: 104 mmol/L (ref 101–111)
CO2: 28 mmol/L (ref 22–32)
CREATININE: 0.72 mg/dL (ref 0.44–1.00)
Calcium: 9 mg/dL (ref 8.9–10.3)
Glucose, Bld: 125 mg/dL — ABNORMAL HIGH (ref 65–99)
Potassium: 3.7 mmol/L (ref 3.5–5.1)
Sodium: 139 mmol/L (ref 135–145)
TOTAL PROTEIN: 6.8 g/dL (ref 6.5–8.1)

## 2015-12-24 LAB — CBC WITH DIFFERENTIAL/PLATELET
BASOS ABS: 0 10*3/uL (ref 0.0–0.1)
BASOS PCT: 0 %
EOS ABS: 0.1 10*3/uL (ref 0.0–0.7)
EOS PCT: 2 %
HCT: 36.8 % (ref 36.0–46.0)
HEMOGLOBIN: 11.8 g/dL — AB (ref 12.0–15.0)
Lymphocytes Relative: 16 %
Lymphs Abs: 0.9 10*3/uL (ref 0.7–4.0)
MCH: 29.1 pg (ref 26.0–34.0)
MCHC: 32.1 g/dL (ref 30.0–36.0)
MCV: 90.6 fL (ref 78.0–100.0)
Monocytes Absolute: 0.4 10*3/uL (ref 0.1–1.0)
Monocytes Relative: 7 %
NEUTROS PCT: 75 %
Neutro Abs: 4.2 10*3/uL (ref 1.7–7.7)
PLATELETS: 230 10*3/uL (ref 150–400)
RBC: 4.06 MIL/uL (ref 3.87–5.11)
RDW: 14.3 % (ref 11.5–15.5)
WBC: 5.7 10*3/uL (ref 4.0–10.5)

## 2015-12-24 MED ORDER — ZOLEDRONIC ACID 4 MG/5ML IV CONC
4.0000 mg | Freq: Once | INTRAVENOUS | Status: AC
  Administered 2015-12-24: 4 mg via INTRAVENOUS
  Filled 2015-12-24: qty 5

## 2015-12-24 MED ORDER — ACETAMINOPHEN 325 MG PO TABS
650.0000 mg | ORAL_TABLET | Freq: Once | ORAL | Status: AC
  Administered 2015-12-24: 650 mg via ORAL
  Filled 2015-12-24: qty 2

## 2015-12-24 MED ORDER — SODIUM CHLORIDE 0.9 % IV SOLN
6.0000 mg/kg | Freq: Once | INTRAVENOUS | Status: AC
  Administered 2015-12-24: 672 mg via INTRAVENOUS
  Filled 2015-12-24: qty 14.28

## 2015-12-24 MED ORDER — SODIUM CHLORIDE 0.9 % IV SOLN
Freq: Once | INTRAVENOUS | Status: AC
  Administered 2015-12-24: 11:00:00 via INTRAVENOUS

## 2015-12-24 MED ORDER — HEPARIN SOD (PORK) LOCK FLUSH 100 UNIT/ML IV SOLN
500.0000 [IU] | Freq: Once | INTRAVENOUS | Status: AC | PRN
  Administered 2015-12-24: 500 [IU]
  Filled 2015-12-24: qty 5

## 2015-12-24 MED ORDER — SODIUM CHLORIDE 0.9 % IJ SOLN: 10.0000 mL | INTRAMUSCULAR | Status: DC | PRN

## 2015-12-24 MED ORDER — DIPHENHYDRAMINE HCL 25 MG PO CAPS
50.0000 mg | ORAL_CAPSULE | Freq: Once | ORAL | Status: AC
  Administered 2015-12-24: 50 mg via ORAL
  Filled 2015-12-24: qty 2

## 2015-12-24 NOTE — Patient Instructions (Signed)
[facility] Discharge Instructions for Patients Receiving Chemotherapy   Beginning January 23rd 2017 lab work for the [facility] will be done in the  Main lab at [facility] on 1st floor. If you have a lab appointment with the [facility] please come in thru the  Main Entrance and check in at the main information desk   Today you received the following chemotherapy agents:  herceptin You also received a Zometa infusion today.  If you develop nausea and vomiting, or diarrhea that is not controlled by your medication, call the clinic.  The clinic phone number is [phone]. Office hours are Monday-Friday 8:30am-5:00pm.  BELOW ARE SYMPTOMS THAT SHOULD BE REPORTED IMMEDIATELY:  *FEVER GREATER THAN 101.0 F  *CHILLS WITH OR WITHOUT FEVER  NAUSEA AND VOMITING THAT IS NOT CONTROLLED WITH YOUR NAUSEA MEDICATION  *UNUSUAL SHORTNESS OF BREATH  *UNUSUAL BRUISING OR BLEEDING  TENDERNESS IN MOUTH AND THROAT WITH OR WITHOUT PRESENCE OF ULCERS  *URINARY PROBLEMS  *BOWEL PROBLEMS  UNUSUAL RASH Items with * indicate a potential emergency and should be followed up as soon as possible. If you have an emergency after office hours please contact your primary care physician or go to the nearest emergency department.  Please call the clinic during office hours if you have any questions or concerns.   You may also contact the Patient Navigator at [phone] should you have any questions or need assistance in obtaining follow up care.      Resources For Cancer Patients and their Caregivers ? American Cancer Society: Can assist with transportation, wigs, general needs, runs Look Good Feel Better.        [phone] ? Cancer Care: Provides financial assistance, online support groups, medication/co-pay assistance.  1-800-813-HOPE 762-792-8601) ? [place] Assists [place] cancer patients and their families through emotional , educational  and financial support.  (681) [phone] ? [place] DSS Where to apply for food stamps, Medicaid and utility assistance. 226-043-3835 ? RCATS: Transportation to medical appointments. 6098661338 ? Social Security Administration: May apply for disability if have a Stage IV cancer. [phone] 423-454-8684 ? [employer], Disability and Transit Services: Assists with nutrition, care and transit needs. [phone]

## 2015-12-24 NOTE — Assessment & Plan Note (Signed)
Stage IV adenocarcinoma of the breast, ER+, HER 2 + disease.  S/P BSO by Dr. Gaetano Net on 08/27/2015.  Oncology history is updated.  Current therapy is Faslodex/Herceptin/Zometa.  Zometa was started today, but ordered ~6 weeks ago but held due to hypocalcemia.  Zometa today.  Faslodex is due next week.  Labs today: CBC diff, CMET, CA 27.29, and CA 15-3.  I personally reviewed and went over laboratory results with the patient.  The results are noted within this dictation.  Next MUGA is scheduled for 12/31/2015.  Return in 3 weeks for follow-up.

## 2015-12-24 NOTE — Progress Notes (Signed)
[name], MD [place] [address]  Breast cancer, stage 4, unspecified laterality ([place])  CURRENT THERAPY: Herceptin/Faslodex/Zometa  INTERVAL HISTORY: [name] 50 y.o. female returns for followup of stage IV adenocarcinoma of breast (unknown primary), ER+, HER2 + disease.    Breast cancer, stage 4 ([place])   04/25/2014 Initial Diagnosis    Breast cancer, stage 4     04/25/2014 Imaging    CT abdomen/pelvia with widespread metastatic disease to the liver, multiple lytic lesions throughout spine and pelvis. No FX or epidural tumor identified     04/26/2014 Imaging    CT head unremarkable     04/26/2014 Imaging    CT chest with no lung mass or pulmonary nodules, no adenopathy. Lytic bone lesions, right 2nd rib     04/27/2014 Initial Biopsy    U/S guided liver biopsy, lesion in anterior and inferior left hepatic lobe biopsied     04/27/2014 Pathology Results    Metastatic adenocarcinoma, CK7, ER+, patchy positivity with PR. Possible primary includes breast, less likely gynecologic     05/15/2014 Mammogram    BI-RADS CATEGORY  2: Benign Finding(s)     05/16/2014 PET scan    1. Intensely hypermetabolic hepatic metastasis. 2. Widespread hypermetabolic skeletal lesions. 3. No primary adenocarcinoma identified by FDG PET imaging.     05/19/2014 Imaging    MUGA- Left ventricular ejection fracture greater than 70%.     05/21/2014 Breast MRI    No suspicious masses or enhancement within the breasts. No axillary adenopathy.     05/22/2014 - 07/03/2014 Antibody Plan    Herceptin/Perjeta/Tamoxifen     06/12/2014 - 07/03/2014 Chemotherapy    Taxotere added secondary to persistent abdominal and back pain     06/17/2014 - 06/19/2014 Hospital Admission    Neutropenia, fever, diarrhea, nausea, vomiting     06/20/2014 - 07/10/2014 Radiation Therapy    Dr. Thea Silversmith 12 fractions to L3-S3 (30 Gy) and left scapula (20 Gy).      07/03/2014 Adverse Reaction   Perjeta- induced diarrhea.  Perjeta discontinued     07/16/2014 - 07/20/2014 Hospital Admission    Electrolyte abnormalities, and diarrhea.  Suspect Perjeta-induced diarrhea.  Negative GI work-up.     07/24/2014 - 08/19/2015 Chemotherapy    Herceptin/Tamoxifen/Xgeva     08/21/2014 Imaging    MUGA- Left ventricular ejection fraction equals 71%.     08/24/2014 PET scan    Dramatic reduction in metabolic activity of the widespread liver metastasis. Liver metastasis now have metabolic activity equal to background normal liver activity. Liver has a nodular contour. Marked reduction in metabolic activity of skeletal lesions..     10/05/2014 Progression    Widespread metastatic disease to the brain as described. Between 20 and 30 intracranial metastatic deposits are now seen. No midline shift or incipient herniation     10/09/2014 - 10/26/2014 Radiation Therapy    Whole Brain XRT     11/14/2014 Imaging    MUGA- LVEF 67%     02/13/2015 Imaging    MUGA- LVEF 59%     02/15/2015 Treatment Plan Change    Due to declining LVEF, will hold Herceptin per PI guidelines.     04/12/2015 -  Chemotherapy    Herceptin restarted     06/02/2015 - 06/08/2015 Hospital Admission    Pneumonia     07/05/2015 Progression     PET/CT concern for mild progression of skeletal metastasis with several lesions  within the spine and 1 lesion in the Left iliac wing with mild to moderate metabolic activity new from prior. Rising CA 27-29     07/16/2015 - 10/23/2015 Anti-estrogen oral therapy    Arimidex     07/16/2015 Imaging    MRI brain with satisfactory post treatment apperance of brain. interval resolved enhancing R caudate metastasis, minimal punctate residual enhancing metastatic disease at the inferior L cerebellum. No new metastatic disease or new intracranial abnormality     07/19/2015 Treatment Plan Change    Discontinue Tamoxifen, Zoladex plus Arimidex.      08/27/2015 Procedure    Laparoscopic bilateral  salpingo-oophorectomy by Dr. Gaetano Net     10/17/2015 PET scan    Osseous metastatic disease appears slightly progressive based on a new right scapular lesion and increased uptake within lesions in the thoracic spine, left iliac wing and  proximal right femur.     10/17/2015 Progression    Slight progression on PET scan imaging.     10/18/2015 Imaging    REsolved enhancing metastatic disease to the brain status post WBXRT     10/23/2015 -  Adjuvant Chemotherapy    Faslodex loading followed by maintenance dose.  (Herceptin continued)     11/04/2015 Imaging    MUGA- LEFT ventricular ejection fraction 51% slightly decreased in a 57% on the previous exam.     12/24/2015 Treatment Plan Change    Zometa every 28 days.  Xgeva discontinued.        She recently returned from the beach.    She denies any new complaints.  She continues to have issues with her feet bilaterally.  She notes that her toes feel like they are "frostbitten."  She notes that they are painful.  She reports that they are worse in the AM and improve throughout the day, but does not resolve.  She is fearful to walk on her feet in the AM due to the discomfort.  Review of Systems  Constitutional: Negative for chills, fever and weight loss.  HENT: Negative.   Eyes: Negative.   Respiratory: Negative.   Cardiovascular: Negative.   Gastrointestinal: Negative for blood in stool, constipation, diarrhea, melena, nausea and vomiting.  Genitourinary: Negative.   Musculoskeletal: Negative.        Feet pain bilaterally  Skin: Negative.   Neurological: Negative for weakness.  Endo/Heme/Allergies: Negative.   Psychiatric/Behavioral: Positive for depression.    Past Medical History:  Diagnosis Date  . Anticoagulated    xarelto  . Anxiety   . Breast cancer metastasized to multiple sites [facility])    liver, brain, and bone  . Breast cancer, stage 4 [facility]) oncologist-  dr Larene [place] ([facility])   dx 12/ 2015 -- breast cancer  Stage 4,  ER/HER2 +,  w/  liver, brain and  bone mets/  chemotherapy and radiation therapy  . Chronic pain syndrome    secondary to cancer   . Depression   . Drug-induced cardiomyopathy ([place])    per last MUGA (08-08-2015), ef 56.5/ per last echo 05-18-2014 ef 60-65%  . Family history of prostate cancer   . GERD (gastroesophageal reflux disease)   . History of colon polyps    07-13-2013  benign  . History of DVT (deep vein thrombosis)    07-09-2014  upper right extremity-  RIJ and right subclavian--  resolved  . History of gastritis    erosive  . History of pneumonia    HCAP 06-07-2015--  resolved per cxr 07-04-2015  .  History of radiation therapy    12 fractions to L3 - S3, 30Gy and left spacula 20Gy (06-20-2014 to 07-10-2014) //  whole brain rxt (10-09-2014 to 10-26-2014)  . History of small bowel obstruction    S/P RESECTION 2008  . Migraine   . PONV (postoperative nausea and vomiting)    pt states scope patch does well    Past Surgical History:  Procedure Laterality Date  . BREAST REDUCTION SURGERY  03/17/2011   Procedure: MAMMARY REDUCTION BILATERAL (BREAST);  Surgeon: Mary A Contogiannis;  Location: [facility];  Service: Plastics;  Laterality: Bilateral;  . CATARACT EXTRACTION W/ INTRAOCULAR LENS  IMPLANT, BILATERAL  2008  . CERVICAL FUSION  2003   C5 -- C6  . COLONOSCOPY N/A 07/13/2013   Procedure: COLONOSCOPY;  Surgeon: Rogene Houston, MD;  [facility];  Service: Endoscopy;  Laterality: N/A;  930  . COLONOSCOPY N/A 11/26/2014   Procedure: COLONOSCOPY;  Surgeon: Rogene Houston, MD;  [facility];  Service: Endoscopy;  Laterality: N/A;  730  . D & C HYSTEROSCOPY/  RESECTION ENDOMETRIAL MASS/  [place] ENDOMETRIAL ABLATION  04-11-2010  . DX LAPAROSCOPY W/ PARTIAL SMALL BOWEL RESECTION AND APPENDECTOMY  04-13-2007  . ESOPHAGOGASTRODUODENOSCOPY N/A 05/25/2014   Procedure: ESOPHAGOGASTRODUODENOSCOPY (EGD);  Surgeon: Rogene Houston, MD;   [facility];  Service: Endoscopy;  Laterality: N/A;  155  . KNEE ARTHROSCOPY Right 2005  . LAPAROSCOPIC ASSISTED VAGINAL HYSTERECTOMY  10-13-2010   w/ Bx Left Fallopian tube and Aspiration Right Ovarian Cyst  . LAPAROSCOPIC CHOLECYSTECTOMY  11-17-2002  . LAPAROSCOPIC SALPINGO OOPHERECTOMY Bilateral 08/26/2015   Procedure: LAPAROSCOPIC SALPINGO OOPHORECTOMY, bilateral;  Surgeon: Everlene Farrier, MD;  Location: [facility];  Service: Gynecology;  Laterality: Bilateral;  . PORTACATH PLACEMENT  05-17-2014  . SHOULDER ARTHROSCOPY WITH ROTATOR CUFF REPAIR Right 2002  . TRANSTHORACIC ECHOCARDIOGRAM  05-18-2014   ef 60-65%//   last MUGA  (08-08-2015)  ef 56.6%      Family History  Problem Relation Age of Onset  . Diabetes Father   . Heart attack Maternal Grandmother 30    multiple over lifetime.  . Cancer Maternal Grandmother 40    NOS  . Prostate cancer Maternal Grandfather     dx in his 21s  . Lung cancer Paternal Grandfather     dx <50  . Lymphoma Maternal Aunt     dx in her 85s  . Melanoma Cousin 53    maternal first cousin  . Brain cancer Cousin     paternal first cousin dx under 42  . Prostate cancer Other     MGF's father  . Colon cancer Other     MGM's mother    Social History   Social History  . Marital status: Married    Spouse name: N/A  . Number of children: N/A  . Years of education: N/A   Social History Main Topics  . Smoking status: Former Smoker    Types: Cigarettes    Quit date: 08/20/1994  . Smokeless tobacco: Never Used  . Alcohol use Yes     Comment: Occasionally  . Drug use: No  . Sexual activity: Not Asked   Other Topics Concern  . None   Social History Narrative  . None     PHYSICAL EXAMINATION  ECOG PERFORMANCE STATUS: 0 - Asymptomatic  Vitals:   12/24/15 1024  BP: 127/71  Pulse: 100  Resp: 16  Temp: 97.9 F (36.6 C)  GENERAL:alert, no distress, well nourished, well developed, comfortable, cooperative,  obese, smiling and unaccompanied. SKIN: skin color, texture, turgor are normal, no rashes or significant lesions HEAD: Normocephalic, No masses, lesions, tenderness or abnormalities EYES: normal, EOMI, Conjunctiva are pink and non-injected EARS: External ears normal OROPHARYNX:lips, buccal mucosa, and tongue normal and mucous membranes are moist  NECK: supple, trachea midline LYMPH:  not examined BREAST:not examined LUNGS: clear to auscultation  HEART: regular rate & rhythm, no murmurs, no gallops, S1 normal and S2 normal ABDOMEN:abdomen soft, non-tender and normal bowel sounds BACK: Back symmetric, no curvature. EXTREMITIES:less then 2 second capillary refill, no joint deformities, effusion, or inflammation, no skin discoloration, no cyanosis.  No abnormalities with feet noted on exam.  NEURO: alert & oriented x 3 with fluent speech, no focal motor/sensory deficits, gait normal   LABORATORY DATA: CBC    Component Value Date/Time   WBC 5.7 12/24/2015 1043   RBC 4.06 12/24/2015 1043   HGB 11.8 (L) 12/24/2015 1043   HCT 36.8 12/24/2015 1043   PLT 230 12/24/2015 1043   MCV 90.6 12/24/2015 1043   MCH 29.1 12/24/2015 1043   MCHC 32.1 12/24/2015 1043   RDW 14.3 12/24/2015 1043   LYMPHSABS 0.9 12/24/2015 1043   MONOABS 0.4 12/24/2015 1043   EOSABS 0.1 12/24/2015 1043   BASOSABS 0.0 12/24/2015 1043      Chemistry      Component Value Date/Time   NA 139 12/24/2015 1043   K 3.7 12/24/2015 1043   CL 104 12/24/2015 1043   CO2 28 12/24/2015 1043   BUN 9 12/24/2015 1043   CREATININE 0.72 12/24/2015 1043      Component Value Date/Time   CALCIUM 9.0 12/24/2015 1043   ALKPHOS 68 12/24/2015 1043   AST 21 12/24/2015 1043   ALT 18 12/24/2015 1043   BILITOT 0.4 12/24/2015 1043        PENDING LABS:   RADIOGRAPHIC STUDIES:  No results found.   PATHOLOGY:    ASSESSMENT AND PLAN:  Breast cancer, stage 4 ([facility]) Stage IV adenocarcinoma of the breast, ER+, HER 2 + disease.   S/P BSO by Dr. Gaetano Net on 08/27/2015.  Oncology history is updated.  Current therapy is Faslodex/Herceptin/Zometa.  Zometa was started today, but ordered ~6 weeks ago but held due to hypocalcemia.  Zometa today.  Faslodex is due next week.  Labs today: CBC diff, CMET, CA 27.29, and CA 15-3.  I personally reviewed and went over laboratory results with the patient.  The results are noted within this dictation.  Next MUGA is scheduled for 12/31/2015.  Return in 3 weeks for follow-up.   ORDERS PLACED FOR THIS ENCOUNTER: No orders of the defined types were placed in this encounter.   MEDICATIONS PRESCRIBED THIS ENCOUNTER: No orders of the defined types were placed in this encounter.   THERAPY PLAN:  Continue treatment as outlined above.  All questions were answered. The patient knows to call the clinic with any problems, questions or concerns. We can certainly see the patient much sooner if necessary.  Patient and plan discussed with Dr. Ancil Linsey and she is in agreement with the aforementioned.   This note is electronically signed by: Doy Mince 12/24/2015 7:32 PM

## 2015-12-24 NOTE — Patient Instructions (Addendum)
[place] at [facility] Discharge Instructions  RECOMMENDATIONS MADE BY THE CONSULTANT AND ANY TEST RESULTS WILL BE SENT TO YOUR REFERRING PHYSICIAN.  You saw Kirby Crigler, PA-C, Today. Labs today-Zometa today if labs are ok. Faslodex in 3 weeks. Follow up with Dr. Whitney Muse in 3 weeks  Thank you for choosing [place] at [facility] to provide your oncology and hematology care.  To afford each patient quality time with our provider, please arrive at least 15 minutes before your scheduled appointment time.   Beginning January 23rd 2017 lab work for the [facility] will be done in the  Main lab at [facility] on 1st floor. If you have a lab appointment with the [facility] please come in thru the  Main Entrance and check in at the main information desk  You need to re-schedule your appointment should you arrive 10 or more minutes late.  We strive to give you quality time with our providers, and arriving late affects you and other patients whose appointments are after yours.  Also, if you no show three or more times for appointments you may be dismissed from the clinic at the providers discretion.     Again, thank you for choosing [facility].  Our hope is that these requests will decrease the amount of time that you wait before being seen by our physicians.       _____________________________________________________________  Should you have questions after your visit to [facility], please contact our office at [phone] between the hours of 8:30 a.m. and 4:30 p.m.  Voicemails left after 4:30 p.m. will not be returned until the following business day.  For prescription refill requests, have your pharmacy contact our office.         Resources For Cancer Patients and their Caregivers ? American Cancer Society: Can assist with transportation, wigs, general needs, runs Look Good Feel Better.         [phone] ? Cancer Care: Provides financial assistance, online support groups, medication/co-pay assistance.  1-800-813-HOPE 218 824 1096) ? [place] Assists [place] cancer patients and their families through emotional , educational and financial support.  254-257-5350 ? [place] DSS Where to apply for food stamps, Medicaid and utility assistance. (908)[phone] ? RCATS: Transportation to medical appointments. ([phone] ? Social Security Administration: May apply for disability if have a Stage IV cancer. [phone] 956-216-4152 ? [employer], Disability and Transit Services: Assists with nutrition, care and transit needs. [place] Support Programs: @10RELATIVEDAYS @ > Cancer Support Group  2nd Tuesday of the month 1pm-2pm, Journey Room  > Creative Journey  3rd Tuesday of the month 1130am-1pm, Journey Room  > Look Good Feel Better  1st Wednesday of the month 10am-12 noon, Journey Room (Call [place] to register [phone])

## 2015-12-24 NOTE — Progress Notes (Signed)
1250:  Tolerated infusions w/o adverse reaction.

## 2015-12-24 NOTE — Progress Notes (Signed)
See chemo encounter 

## 2015-12-25 LAB — CANCER ANTIGEN 27.29: CA 27.29: 39.1 U/mL — ABNORMAL HIGH (ref 0.0–38.6)

## 2015-12-25 LAB — CANCER ANTIGEN 15-3: CAN 15 3: 29.7 U/mL — AB (ref 0.0–25.0)

## 2015-12-27 ENCOUNTER — Other Ambulatory Visit (HOSPITAL_COMMUNITY): Payer: Self-pay

## 2015-12-27 DIAGNOSIS — E876 Hypokalemia: Secondary | ICD-10-CM

## 2015-12-27 DIAGNOSIS — C50919 Malignant neoplasm of unspecified site of unspecified female breast: Secondary | ICD-10-CM

## 2015-12-27 MED ORDER — POTASSIUM CHLORIDE CRYS ER 20 MEQ PO TBCR: 20.0000 meq | EXTENDED_RELEASE_TABLET | Freq: Two times a day (BID) | ORAL | 3 refills | Status: DC

## 2015-12-27 NOTE — Telephone Encounter (Signed)
Prescription for potassium sent to pharmacy. Called patient and let her know. She verbalized understanding.

## 2015-12-31 ENCOUNTER — Encounter (HOSPITAL_COMMUNITY)
Admission: RE | Admit: 2015-12-31 | Discharge: 2015-12-31 | Disposition: A | Discharge status: Home / Self Care | Payer: 59 | Source: Ambulatory Visit | Attending: Oncology | Admitting: Oncology

## 2015-12-31 DIAGNOSIS — C50919 Malignant neoplasm of unspecified site of unspecified female breast: Secondary | ICD-10-CM

## 2015-12-31 MED ORDER — HEPARIN SOD (PORK) LOCK FLUSH 100 UNIT/ML IV SOLN
INTRAVENOUS | Status: AC
  Filled 2015-12-31: qty 5

## 2016-01-01 ENCOUNTER — Encounter (HOSPITAL_COMMUNITY): Payer: Self-pay

## 2016-01-01 ENCOUNTER — Encounter (HOSPITAL_COMMUNITY)
Admission: RE | Admit: 2016-01-01 | Discharge: 2016-01-01 | Disposition: A | Discharge status: Home / Self Care | Payer: 59 | Source: Ambulatory Visit | Attending: Oncology | Admitting: Oncology

## 2016-01-01 DIAGNOSIS — C50919 Malignant neoplasm of unspecified site of unspecified female breast: Secondary | ICD-10-CM | POA: Insufficient documentation

## 2016-01-01 MED ORDER — HEPARIN SOD (PORK) LOCK FLUSH 100 UNIT/ML IV SOLN
INTRAVENOUS | Status: AC
  Filled 2016-01-01: qty 5

## 2016-01-01 MED ORDER — TECHNETIUM TC 99M-LABELED RED BLOOD CELLS IV KIT
20.0000 | PACK | Freq: Once | INTRAVENOUS | Status: AC | PRN
  Administered 2016-01-01: 20 via INTRAVENOUS

## 2016-01-02 ENCOUNTER — Encounter (HOSPITAL_COMMUNITY): Payer: Self-pay | Admitting: Oncology

## 2016-01-02 ENCOUNTER — Telehealth (HOSPITAL_COMMUNITY): Payer: Self-pay | Admitting: *Deleted

## 2016-01-02 ENCOUNTER — Other Ambulatory Visit (HOSPITAL_COMMUNITY): Payer: Self-pay | Admitting: Hematology & Oncology

## 2016-01-02 DIAGNOSIS — R5382 Chronic fatigue, unspecified: Secondary | ICD-10-CM

## 2016-01-02 DIAGNOSIS — C50919 Malignant neoplasm of unspecified site of unspecified female breast: Secondary | ICD-10-CM

## 2016-01-02 DIAGNOSIS — C229 Malignant neoplasm of liver, not specified as primary or secondary: Secondary | ICD-10-CM

## 2016-01-02 NOTE — Telephone Encounter (Signed)
-----   Message from [facility], PA-C sent at 01/01/2016  6:21 PM EDT ----- Kermit Balo

## 2016-01-02 NOTE — Telephone Encounter (Signed)
Pt aware of results 

## 2016-01-03 ENCOUNTER — Other Ambulatory Visit (HOSPITAL_COMMUNITY): Payer: Self-pay

## 2016-01-03 DIAGNOSIS — R5382 Chronic fatigue, unspecified: Secondary | ICD-10-CM

## 2016-01-03 DIAGNOSIS — C50919 Malignant neoplasm of unspecified site of unspecified female breast: Secondary | ICD-10-CM

## 2016-01-03 DIAGNOSIS — C229 Malignant neoplasm of liver, not specified as primary or secondary: Secondary | ICD-10-CM

## 2016-01-03 DIAGNOSIS — C7951 Secondary malignant neoplasm of bone: Secondary | ICD-10-CM

## 2016-01-03 MED ORDER — METHYLPHENIDATE HCL 10 MG PO TABS: ORAL_TABLET | ORAL | 0 refills | Status: DC

## 2016-01-03 MED ORDER — MORPHINE SULFATE ER 30 MG PO TBCR: 30.0000 mg | EXTENDED_RELEASE_TABLET | Freq: Two times a day (BID) | ORAL | 0 refills | Status: DC

## 2016-01-03 MED ORDER — FUROSEMIDE 20 MG PO TABS: 20.0000 mg | ORAL_TABLET | Freq: Two times a day (BID) | ORAL | 1 refills | Status: DC

## 2016-01-03 NOTE — Telephone Encounter (Signed)
Patient called for refills. Chart checked and refilled per protocol.

## 2016-01-10 NOTE — Progress Notes (Signed)
[place] [facility] [place], MD [place] [address]    Breast cancer, stage 4 [facility])   04/25/2014 Initial Diagnosis    Breast cancer, stage 4      04/25/2014 Imaging    CT abdomen/pelvia with widespread metastatic disease to the liver, multiple lytic lesions throughout spine and pelvis. No FX or epidural tumor identified      04/26/2014 Imaging    CT head unremarkable      04/26/2014 Imaging    CT chest with no lung mass or pulmonary nodules, no adenopathy. Lytic bone lesions, right 2nd rib      04/27/2014 Initial Biopsy    U/S guided liver biopsy, lesion in anterior and inferior left hepatic lobe biopsied      04/27/2014 Pathology Results    Metastatic adenocarcinoma, CK7, ER+, patchy positivity with PR. Possible primary includes breast, less likely gynecologic      05/15/2014 Mammogram    BI-RADS CATEGORY  2: Benign Finding(s)      05/16/2014 PET scan    1. Intensely hypermetabolic hepatic metastasis. 2. Widespread hypermetabolic skeletal lesions. 3. No primary adenocarcinoma identified by FDG PET imaging.      05/19/2014 Imaging    MUGA- Left ventricular ejection fracture greater than 70%.      05/21/2014 Breast MRI    No suspicious masses or enhancement within the breasts. No axillary adenopathy.      05/22/2014 - 07/03/2014 Antibody Plan    Herceptin/Perjeta/Tamoxifen      06/12/2014 - 07/03/2014 Chemotherapy    Taxotere added secondary to persistent abdominal and back pain      06/17/2014 - 06/19/2014 Hospital Admission    Neutropenia, fever, diarrhea, nausea, vomiting      06/20/2014 - 07/10/2014 Radiation Therapy    Dr. Thea Silversmith 12 fractions to L3-S3 (30 Gy) and left scapula (20 Gy).       07/03/2014 Adverse Reaction    Perjeta- induced diarrhea.  Perjeta discontinued      07/16/2014 - 07/20/2014 Hospital Admission    Electrolyte abnormalities, and diarrhea.  Suspect Perjeta-induced diarrhea.   Negative GI work-up.      07/24/2014 - 08/19/2015 Chemotherapy    Herceptin/Tamoxifen/Xgeva      08/21/2014 Imaging    MUGA- Left ventricular ejection fraction equals 71%.      08/24/2014 PET scan    Dramatic reduction in metabolic activity of the widespread liver metastasis. Liver metastasis now have metabolic activity equal to background normal liver activity. Liver has a nodular contour. Marked reduction in metabolic activity of skeletal lesions..      10/05/2014 Progression    Widespread metastatic disease to the brain as described. Between 20 and 30 intracranial metastatic deposits are now seen. No midline shift or incipient herniation      10/09/2014 - 10/26/2014 Radiation Therapy    Whole Brain XRT      11/14/2014 Imaging    MUGA- LVEF 67%      02/13/2015 Imaging    MUGA- LVEF 59%      02/15/2015 Treatment Plan Change    Due to declining LVEF, will hold Herceptin per PI guidelines.      04/12/2015 -  Chemotherapy    Herceptin restarted      06/02/2015 - 06/08/2015 Hospital Admission    Pneumonia      07/05/2015 Progression     PET/CT concern for mild progression of skeletal metastasis with several lesions within the  spine and 1 lesion in the Left iliac wing with mild to moderate metabolic activity new from prior. Rising CA 27-29      07/16/2015 - 10/23/2015 Anti-estrogen oral therapy    Arimidex      07/16/2015 Imaging    MRI brain with satisfactory post treatment apperance of brain. interval resolved enhancing R caudate metastasis, minimal punctate residual enhancing metastatic disease at the inferior L cerebellum. No new metastatic disease or new intracranial abnormality      07/19/2015 Treatment Plan Change    Discontinue Tamoxifen, Zoladex plus Arimidex.       08/27/2015 Procedure    Laparoscopic bilateral salpingo-oophorectomy by Dr. Gaetano Net      10/17/2015 PET scan    Osseous metastatic disease appears slightly progressive based on a new right scapular lesion and  increased uptake within lesions in the thoracic spine, left iliac wing and  proximal right femur.      10/17/2015 Progression    Slight progression on PET scan imaging.      10/18/2015 Imaging    REsolved enhancing metastatic disease to the brain status post WBXRT      10/23/2015 -  Adjuvant Chemotherapy    Faslodex loading followed by maintenance dose.  (Herceptin continued)      11/04/2015 Imaging    MUGA- LEFT ventricular ejection fraction 51% slightly decreased in a 57% on the previous exam.      12/24/2015 Treatment Plan Change    Zometa every 28 days.  Xgeva discontinued.        01/01/2016 Imaging    MUGA- Left ventricular ejection fraction equals 57.9%. This is increased from 51.1% previously.       CURRENT THERAPY: Herceptin/Arimidex//XGEVA  INTERVAL HISTORY: [name] 50 y.o. female returns for follow-up of stage IV adenocarcinoma of the breast, ER+, HER 2 + disease. Mrs. An was here with husband today. She is here for ongoing herceptin therapy.  She is planning a trip to [place] next Thursday and is not sure exactly how long be gone. States she will be back by September 19th.   Reports her feet hurt so badly. She went to the mountains a couple weeks ago and went hiking. She could not get out of bed the next day due to the pain. Her feet sometimes hurt all over. Other times it feels like her heel has stepped on a stone and bruised. They feel like they are burning but are cold to the touch. It also feels like they are broken. She is sometimes scared to walk. She plans to see Dr. Paulla Dolly, a foot specialist about this. She also asked if there are pain medications specifically for neuropathy. She also notes her father is diabetic and has neuropathy so she wonders if maybe she is developing the same thing.  Her hands have also began to hurt and feel as though there is an electric shock going through them.   With the new infusion, Zometa, she stayed in bed all the next day  with flu-like symptoms. Stating everything felt sore and she hardly ate anything. Everything tasted like metal for 2 days after the infusion. These symptoms are now gone.   Husband reports she is not sleeping. Patient admits, "I'm not resting, I guess". She takes 10 mg melatonin and Ambien at night with no relief.  Admits to headaches with the changing weather.  Reports intermittent shortness of breath. The last instance of SOB was this morning in the shower and she had to sit down.  Reports itching in her underarms and hands.  Reports skin hurts at breast reduction site.  Reports shoulder pain comes and goes.  Reports joint pain.  She denies chest pain.  MEDICAL HISTORY: Past Medical History:  Diagnosis Date  . Anticoagulated    xarelto  . Anxiety   . Breast cancer metastasized to multiple sites [facility])    liver, brain, and bone  . Breast cancer, stage 4 [facility]) oncologist-  dr Larene [place] ([facility])   dx 12/ 2015 -- breast cancer Stage 4,  ER/HER2 +,  w/  liver, brain and  bone mets/  chemotherapy and radiation therapy  . Chronic pain syndrome    secondary to cancer   . Depression   . Drug-induced cardiomyopathy ([place])    per last MUGA (08-08-2015), ef 56.5/ per last echo 05-18-2014 ef 60-65%  . Family history of prostate cancer   . GERD (gastroesophageal reflux disease)   . History of colon polyps    07-13-2013  benign  . History of DVT (deep vein thrombosis)    07-09-2014  upper right extremity-  RIJ and right subclavian--  resolved  . History of gastritis    erosive  . History of pneumonia    HCAP 06-07-2015--  resolved per cxr 07-04-2015  . History of radiation therapy    12 fractions to L3 - S3, 30Gy and left spacula 20Gy (06-20-2014 to 07-10-2014) //  whole brain rxt (10-09-2014 to 10-26-2014)  . History of small bowel obstruction    S/P RESECTION 2008  . Migraine   . PONV (postoperative nausea and vomiting)    pt states scope patch does well    has  Hyperlipidemia; Depression; Breast cancer, stage 4 ([place]); Bone metastases ([place]); DVT (deep venous thrombosis) ([place]); GERD (gastroesophageal reflux disease); Family history of prostate cancer; Genetic testing; Brain metastases ([place]); Drug-induced cardiomyopathy ([place]); HCAP (healthcare-associated pneumonia); Pneumonia; Metastatic breast cancer ([place]); and Esophageal reflux on her problem list.      has No Known Allergies.  Ms. Lauritsen had no medications administered during this visit.  SURGICAL HISTORY: Past Surgical History:  Procedure Laterality Date  . BREAST REDUCTION SURGERY  03/17/2011   Procedure: MAMMARY REDUCTION BILATERAL (BREAST);  Surgeon: Mary A Contogiannis;  Location: [place];  Service: Plastics;  Laterality: Bilateral;  . CATARACT EXTRACTION W/ INTRAOCULAR LENS  IMPLANT, BILATERAL  2008  . CERVICAL FUSION  2003   C5 -- C6  . COLONOSCOPY N/A 07/13/2013   Procedure: COLONOSCOPY;  Surgeon: Rogene Houston, MD;  [facility];  Service: Endoscopy;  Laterality: N/A;  930  . COLONOSCOPY N/A 11/26/2014   Procedure: COLONOSCOPY;  Surgeon: Rogene Houston, MD;  [facility];  Service: Endoscopy;  Laterality: N/A;  730  . D & C HYSTEROSCOPY/  RESECTION ENDOMETRIAL MASS/  [place] ENDOMETRIAL ABLATION  04-11-2010  . DX LAPAROSCOPY W/ PARTIAL SMALL BOWEL RESECTION AND APPENDECTOMY  04-13-2007  . ESOPHAGOGASTRODUODENOSCOPY N/A 05/25/2014   Procedure: ESOPHAGOGASTRODUODENOSCOPY (EGD);  Surgeon: Rogene Houston, MD;  [facility];  Service: Endoscopy;  Laterality: N/A;  155  . KNEE ARTHROSCOPY Right 2005  . LAPAROSCOPIC ASSISTED VAGINAL HYSTERECTOMY  10-13-2010   w/ Bx Left Fallopian tube and Aspiration Right Ovarian Cyst  . LAPAROSCOPIC CHOLECYSTECTOMY  11-17-2002  . LAPAROSCOPIC SALPINGO OOPHERECTOMY Bilateral 08/26/2015   Procedure: LAPAROSCOPIC SALPINGO OOPHORECTOMY, bilateral;  Surgeon: Everlene Farrier, MD;  Location: [place];   Service: Gynecology;  Laterality: Bilateral;  . PORTACATH PLACEMENT  05-17-2014  . SHOULDER ARTHROSCOPY WITH ROTATOR CUFF REPAIR Right 2002  . TRANSTHORACIC ECHOCARDIOGRAM  05-18-2014   ef 60-65%//   last MUGA  (08-08-2015)  ef 56.6%      SOCIAL HISTORY: Social History   Social History  . Marital status: Married    Spouse name: N/A  . Number of children: N/A  . Years of education: N/A   Occupational History  . Not on file.   Social History Main Topics  . Smoking status: Former Smoker    Types: Cigarettes    Quit date: 08/20/1994  . Smokeless tobacco: Never Used  . Alcohol use Yes     Comment: Occasionally  . Drug use: No  . Sexual activity: Not on file   Other Topics Concern  . Not on file   Social History Narrative  . No narrative on file    FAMILY HISTORY: Family History  Problem Relation Age of Onset  . Diabetes Father   . Heart attack Maternal Grandmother 30    multiple over lifetime.  . Cancer Maternal Grandmother 26    NOS  . Prostate cancer Maternal Grandfather     dx in his 61s  . Lung cancer Paternal Grandfather     dx <50  . Lymphoma Maternal Aunt     dx in her 45s  . Melanoma Cousin 84    maternal first cousin  . Brain cancer Cousin     paternal first cousin dx under 63  . Prostate cancer Other     MGF's father  . Colon cancer Other     MGM's mother  Her son lives in [place]. Her daughter lives in [place].  Review of Systems  Constitutional: Positive for malaise/fatigue. Negative for fever, chills, weight loss.  HENT: . Negative for congestion, hearing loss, sore throat and tinnitus.   Eyes: Negative for double vision, pain and discharge.  Respiratory:  Negative for cough, hemoptysis, sputum production, and wheezing.   Cardiovascular: Positive for shortness of breath. Negative for chest pain, palpitations, claudication, and PND.  Intermittent SOB Gastrointestinal: Negative for heartburn, nausea, diarrhea, constipation, blood in stool  and melena, vomiting.  Genitourinary:Negative for dysuria, urgency, frequency and hematuria.  Musculoskeletal: Positive for joint pain, shoulder pain. Negative for myalgias and falls.  Left shoulder pain comes and goes.  Skin: Positive for itching. Negative for rash.  Itching in underarms and hands. Skin hurts at breast reduction site. Neurological: Positive for headaches and tingling. Negative for tremors, speech change, focal weakness, seizures, loss of consciousness, weakness, blurry vision Feet and hand neuropathy. Headaches associated with change in weather. Endo/Heme/Allergies: Does not bruise/bleed easily.  Psychiatric/Behavioral: Positive for short term memory loss. Negative for suicidal ideas, and substance abuse, nervous/axiety, depression.  14 point review of systems was performed and is negative except as detailed under history of present illness and above   PHYSICAL EXAMINATION  ECOG PERFORMANCE STATUS: 1 - Symptomatic but completely ambulatory   Vitals with BMI 01/15/2016  Height   Weight 246 lbs 8 oz  BMI   Systolic 195  Diastolic 76  Pulse 093  Respirations 16    Physical Exam  Constitutional: She is oriented to person, place, and time and well-developed, well-nourished, and in no distress. Well groomed HENT:  Head: Normocephalic and atraumatic. Hair regrowth. Nose: Nose normal.  Mouth/Throat:  Negative Eyes: Conjunctivae and EOM are normal. Pupils are equal, round, and reactive to light. Right eye exhibits no discharge. Left eye exhibits no discharge. No scleral icterus.  Neck: Normal range of motion. No tracheal deviation present. No thyromegaly present.  Cardiovascular: Normal rate, regular rhythm and normal heart sounds.  Exam reveals no gallop and no friction rub.   No murmur heard. Pulmonary/Chest: Effort normal and breath sounds normal. She has no wheezes. She has no rales.  Abdominal: Soft. Bowel sounds are normal. She exhibits no distension and no mass.  There is no tenderness. There is no rebound and no guarding.  Musculoskeletal: Normal range of motion.  Lymphadenopathy:    She has no cervical adenopathy.  Neurological: She is alert and oriented to person, place, and time. She has normal reflexes. No cranial nerve deficit. Gait normal. Coordination normal.  Skin: Skin is warm and dry. No rash noted.  Tan skin. Psychiatric: Mood, memory, affect and judgment normal.  Nursing note and vitals reviewed.  LABORATORY DATA: I have reviewed the data as listed.  CBC    Component Value Date/Time   WBC 5.4 01/15/2016 1008   RBC 4.24 01/15/2016 1008   HGB 12.0 01/15/2016 1008   HCT 38.0 01/15/2016 1008   PLT 226 01/15/2016 1008   MCV 89.6 01/15/2016 1008   MCH 28.3 01/15/2016 1008   MCHC 31.6 01/15/2016 1008   RDW 14.6 01/15/2016 1008   LYMPHSABS 0.8 01/15/2016 1008   MONOABS 0.5 01/15/2016 1008   EOSABS 0.0 01/15/2016 1008   BASOSABS 0.0 01/15/2016 1008   CMP     Component Value Date/Time   NA 136 01/15/2016 1008   K 3.7 01/15/2016 1008   CL 100 (L) 01/15/2016 1008   CO2 29 01/15/2016 1008   GLUCOSE 126 (H) 01/15/2016 1008   BUN 10 01/15/2016 1008   CREATININE 0.68 01/15/2016 1008   CALCIUM 8.8 (L) 01/15/2016 1008   PROT 6.9 01/15/2016 1008   ALBUMIN 3.8 01/15/2016 1008   AST 26 01/15/2016 1008   ALT 20 01/15/2016 1008   ALKPHOS 78 01/15/2016 1008   BILITOT 0.2 (L) 01/15/2016 1008   GFRNONAA >60 01/15/2016 1008   GFRAA >60 01/15/2016 1008     RADIOLOGY: I have personally reviewed the radiological images as listed and agreed with the findings in the report. Study Result   CLINICAL DATA:  Breast cancer.  EXAM: NUCLEAR MEDICINE CARDIAC BLOOD POOL IMAGING (MUGA)  TECHNIQUE: Cardiac multi-gated acquisition was performed at rest following intravenous injection of Tc-42mlabeled red blood cells.  RADIOPHARMACEUTICALS:  18.8 mCi Tc-963mDP in-vitro labeled red blood cells IV  COMPARISON:   11/04/2015  FINDINGS: The left ventricular ejection fraction equals 57.9%. On the previous exam this was equal to 51.1%.  No focal wall motion abnormalities.  IMPRESSION: 1. Left ventricular ejection fraction equals 57.9%. This is increased from 51.1% previously.   Electronically Signed   By: TaKerby Moors.D.   On: 01/01/2016 14:59     ASSESSMENT and THERAPY PLAN:  Stage IV ER positive, HER-2 positive carcinoma of the breast Bone metastases Widespread Brain Metastases Insomnia Anxiety UE DVT, RIJ and R subclavian Cancer related Fatigue Decline in EF, Herceptin held. Herceptin restarted on 04/12/2015 06/2014 progression of disease (bone only) with rise in tumor markers, abnormal PET Arimidex, herceptin, XGEVA neuropathy  Clinically Jamielynn continues to do well. Will proceed with herceptin today. She will continue on Faslodex. I discussed with her that her symptoms from zometa will likely abate with additional infusions.   Will add a B12 to her labs given her hand/foot complaints. There are no reports of neuropathy with faslodex, herceptin.   I have written  the patient a prescription for Ambien CR 12.5 mg for sleep. Ambien is no longer working.   No other refills needed at this time.  A repeat PET scan will be scheduled after September 19th for restaging.  RTC post PET to discuss results.   NCCN guidelines recommends the following for monitoring of metastatic breast cancer:  A. Components of monitoring:   1. Monitoring includes periodic assessment of varied combinations of symptoms, physical examination, routine laboratory tests, imaging studies, and blood biomarkers where appropriate. Results of monitoring are classified as response/continued response to treatment, stable disease, uncertainty regarding disease status, or progression of disease. The clinician typically must assess and balance multiple different forms of information to make a determination regarding  whether disease is being controlled and the toxicity of treatment is acceptable. Sometimes, this information may be contradictory.  B. Definition of disease progression:   1. Unequivocal evidence of progression of disease by one or more of these factors is required to establish progression of disease, either because of ineffective therapy or acquired resistance of disease to an applied therapy.  Progression of disease may be identified through evidence of growth or worsening of disease at previously known sites of disease and/or of the occurrence of new sites of metastatic disease.   2. Findings concerning for progression of disease include:    A. Worsening symptoms such as pain or dyspnea    B. Evidence of worsening or new disease on physical examination.    C. Declining performance status    D. Unexplained weight loss    E. Increasing Alkaline phosphatase, ALT, AST, or bilirubin    F. Hypercalcemia    G. New radiographic abnormality or increase in the size of pre-existing radiographic abnormality.    H. New areas of abnormality on functional imaging (eg, bone scan, PET/CT scan)    I. Increasing tumor markers (eg, CEA, CA 15-3, CA27.29)  All questions were answered. The patient knows to call the clinic with any problems, questions or concerns. We can certainly see the patient much sooner if necessary.   This note was signed electronically.  This document serves as a record of services personally performed by Ancil Linsey, MD. It was created on her behalf by Arlyce Harman, a trained medical scribe. The creation of this record is based on the scribe's personal observations and the provider's statements to them. This document has been checked and approved by the attending provider.  I have reviewed the above documentation for accuracy and completeness, and I agree with the above.  Kelby Fam. Whitney Muse, MD

## 2016-01-15 ENCOUNTER — Encounter (HOSPITAL_BASED_OUTPATIENT_CLINIC_OR_DEPARTMENT_OTHER): Payer: 59

## 2016-01-15 ENCOUNTER — Encounter (HOSPITAL_COMMUNITY): Payer: Self-pay | Admitting: Hematology & Oncology

## 2016-01-15 ENCOUNTER — Encounter (HOSPITAL_COMMUNITY): Payer: 59 | Attending: Oncology | Admitting: Hematology & Oncology

## 2016-01-15 ENCOUNTER — Encounter (HOSPITAL_COMMUNITY): Payer: 59

## 2016-01-15 VITALS — BP 116/76 | HR 105 | Temp 98.6°F | Resp 16 | Wt 246.5 lb

## 2016-01-15 DIAGNOSIS — G629 Polyneuropathy, unspecified: Secondary | ICD-10-CM

## 2016-01-15 DIAGNOSIS — R5382 Chronic fatigue, unspecified: Secondary | ICD-10-CM

## 2016-01-15 DIAGNOSIS — C7931 Secondary malignant neoplasm of brain: Secondary | ICD-10-CM

## 2016-01-15 DIAGNOSIS — C7951 Secondary malignant neoplasm of bone: Secondary | ICD-10-CM

## 2016-01-15 DIAGNOSIS — C787 Secondary malignant neoplasm of liver and intrahepatic bile duct: Secondary | ICD-10-CM

## 2016-01-15 DIAGNOSIS — C50919 Malignant neoplasm of unspecified site of unspecified female breast: Secondary | ICD-10-CM

## 2016-01-15 DIAGNOSIS — C229 Malignant neoplasm of liver, not specified as primary or secondary: Secondary | ICD-10-CM | POA: Diagnosis not present

## 2016-01-15 DIAGNOSIS — G4700 Insomnia, unspecified: Secondary | ICD-10-CM

## 2016-01-15 DIAGNOSIS — Z5112 Encounter for antineoplastic immunotherapy: Secondary | ICD-10-CM

## 2016-01-15 DIAGNOSIS — Z170 Estrogen receptor positive status [ER+]: Secondary | ICD-10-CM

## 2016-01-15 LAB — COMPREHENSIVE METABOLIC PANEL
ALBUMIN: 3.8 g/dL (ref 3.5–5.0)
ALT: 20 U/L (ref 14–54)
AST: 26 U/L (ref 15–41)
Alkaline Phosphatase: 78 U/L (ref 38–126)
Anion gap: 7 (ref 5–15)
BUN: 10 mg/dL (ref 6–20)
CHLORIDE: 100 mmol/L — AB (ref 101–111)
CO2: 29 mmol/L (ref 22–32)
CREATININE: 0.68 mg/dL (ref 0.44–1.00)
Calcium: 8.8 mg/dL — ABNORMAL LOW (ref 8.9–10.3)
GFR calc Af Amer: >60 mL/min (ref >60)
Glucose, Bld: 126 mg/dL — ABNORMAL HIGH (ref 65–99)
POTASSIUM: 3.7 mmol/L (ref 3.5–5.1)
SODIUM: 136 mmol/L (ref 135–145)
Total Bilirubin: 0.2 mg/dL — ABNORMAL LOW (ref 0.3–1.2)
Total Protein: 6.9 g/dL (ref 6.5–8.1)

## 2016-01-15 LAB — CBC WITH DIFFERENTIAL/PLATELET
BASOS ABS: 0 10*3/uL (ref 0.0–0.1)
BASOS PCT: 0 %
Eosinophils Absolute: 0 10*3/uL (ref 0.0–0.7)
Eosinophils Relative: 0 %
HEMATOCRIT: 38 % (ref 36.0–46.0)
HEMOGLOBIN: 12 g/dL (ref 12.0–15.0)
LYMPHS PCT: 15 %
Lymphs Abs: 0.8 10*3/uL (ref 0.7–4.0)
MCH: 28.3 pg (ref 26.0–34.0)
MCHC: 31.6 g/dL (ref 30.0–36.0)
MCV: 89.6 fL (ref 78.0–100.0)
MONO ABS: 0.5 10*3/uL (ref 0.1–1.0)
MONOS PCT: 10 %
NEUTROS ABS: 4 10*3/uL (ref 1.7–7.7)
NEUTROS PCT: 75 %
PLATELETS: 226 10*3/uL (ref 150–400)
RBC: 4.24 MIL/uL (ref 3.87–5.11)
RDW: 14.6 % (ref 11.5–15.5)
WBC: 5.4 10*3/uL (ref 4.0–10.5)

## 2016-01-15 MED ORDER — FULVESTRANT 250 MG/5ML IM SOLN
INTRAMUSCULAR | Status: AC
  Filled 2016-01-15: qty 5

## 2016-01-15 MED ORDER — SODIUM CHLORIDE 0.9 % IJ SOLN
10.0000 mL | INTRAMUSCULAR | Status: DC | PRN
  Administered 2016-01-15: 10 mL
  Filled 2016-01-15: qty 10

## 2016-01-15 MED ORDER — ACETAMINOPHEN 325 MG PO TABS
650.0000 mg | ORAL_TABLET | Freq: Once | ORAL | Status: AC
  Administered 2016-01-15: 650 mg via ORAL

## 2016-01-15 MED ORDER — FULVESTRANT 250 MG/5ML IM SOLN
500.0000 mg | INTRAMUSCULAR | Status: DC
  Administered 2016-01-15: 500 mg via INTRAMUSCULAR

## 2016-01-15 MED ORDER — ACETAMINOPHEN 325 MG PO TABS
ORAL_TABLET | ORAL | Status: AC
  Filled 2016-01-15: qty 2

## 2016-01-15 MED ORDER — TRASTUZUMAB CHEMO 150 MG IV SOLR
6.0000 mg/kg | Freq: Once | INTRAVENOUS | Status: AC
  Administered 2016-01-15: 672 mg via INTRAVENOUS
  Filled 2016-01-15: qty 32

## 2016-01-15 MED ORDER — ZOLPIDEM TARTRATE ER 12.5 MG PO TBCR: 12.5000 mg | EXTENDED_RELEASE_TABLET | Freq: Every evening | ORAL | 0 refills | Status: DC | PRN

## 2016-01-15 MED ORDER — HEPARIN SOD (PORK) LOCK FLUSH 100 UNIT/ML IV SOLN
500.0000 [IU] | Freq: Once | INTRAVENOUS | Status: AC | PRN
  Administered 2016-01-15: 500 [IU]

## 2016-01-15 MED ORDER — TRASTUZUMAB CHEMO INJECTION 440 MG
6.0000 mg/kg | Freq: Once | INTRAVENOUS | Status: DC
  Filled 2016-01-15: qty 32

## 2016-01-15 MED ORDER — SODIUM CHLORIDE 0.9 % IV SOLN
Freq: Once | INTRAVENOUS | Status: AC
  Administered 2016-01-15: 11:00:00 via INTRAVENOUS

## 2016-01-15 MED ORDER — DIPHENHYDRAMINE HCL 25 MG PO CAPS
ORAL_CAPSULE | ORAL | Status: AC
  Filled 2016-01-15: qty 2

## 2016-01-15 MED ORDER — DIPHENHYDRAMINE HCL 25 MG PO CAPS
50.0000 mg | ORAL_CAPSULE | Freq: Once | ORAL | Status: AC
  Administered 2016-01-15: 50 mg via ORAL

## 2016-01-15 NOTE — Progress Notes (Signed)
Please see other encounter for documentation.   

## 2016-01-15 NOTE — Progress Notes (Signed)
Patient tolerated infusion well.  VSS.   [name] presents today for injection per MD orders. Faslodex administered IM in left and right Gluteal in divided doses. Administration without incident. Patient tolerated well. Patient ambulatory and stable upon discharge.

## 2016-01-15 NOTE — Patient Instructions (Addendum)
[place] at [facility] Discharge Instructions  RECOMMENDATIONS MADE BY THE CONSULTANT AND ANY TEST RESULTS WILL BE SENT TO YOUR REFERRING PHYSICIAN.  You saw Dr. Whitney Muse today. You will be scheduled for PET scan. Follow up after PET. Continue Herceptin and Zometa  Thank you for choosing [place] at [facility] to provide your oncology and hematology care.  To afford each patient quality time with our provider, please arrive at least 15 minutes before your scheduled appointment time.   Beginning January 23rd 2017 lab work for the [facility] will be done in the  Main lab at [facility] on 1st floor. If you have a lab appointment with the [place] please come in thru the  Main Entrance and check in at the main information desk  You need to re-schedule your appointment should you arrive 10 or more minutes late.  We strive to give you quality time with our providers, and arriving late affects you and other patients whose appointments are after yours.  Also, if you no show three or more times for appointments you may be dismissed from the clinic at the providers discretion.     Again, thank you for choosing [facility].  Our hope is that these requests will decrease the amount of time that you wait before being seen by our physicians.       _____________________________________________________________  Should you have questions after your visit to [facility], please contact our office at [phone] between the hours of 8:30 a.m. and 4:30 p.m.  Voicemails left after 4:30 p.m. will not be returned until the following business day.  For prescription refill requests, have your pharmacy contact our office.         Resources For Cancer Patients and their Caregivers ? American Cancer Society: Can assist with transportation, wigs, general needs, runs Look Good Feel Better.        (954)039-0250 ? Cancer  Care: Provides financial assistance, online support groups, medication/co-pay assistance.  1-800-813-HOPE 440-780-8721) ? [facility] Assists [place] cancer patients and their families through emotional , educational and financial support.  336-480-6076 ? [place] DSS Where to apply for food stamps, Medicaid and utility assistance. (279)004-3966 ? [facility]: Transportation to medical appointments. 548-840-1239 ? Social Security Administration: May apply for disability if have a Stage IV cancer. (938) 484-9136 (203)727-4839 ? [employer], Disability and Transit Services: Assists with nutrition, care and transit needs. [place] Support Programs: @10RELATIVEDAYS @ > Cancer Support Group  2nd Tuesday of the month 1pm-2pm, Journey Room  > Creative Journey  3rd Tuesday of the month 1130am-1pm, Journey Room  > Look Good Feel Better  1st Wednesday of the month 10am-12 noon, Journey Room (Call [place] to register [phone])

## 2016-01-15 NOTE — Patient Instructions (Signed)
[facility] Discharge Instructions for Patients Receiving Chemotherapy   Beginning January 23rd 2017 lab work for the [facility] will be done in the  Main lab at [facility] on 1st floor. If you have a lab appointment with the [facility] please come in thru the  Main Entrance and check in at the main information desk   Today you received the following chemotherapy agent: herceptin. Faslodex as well today.     If you develop nausea and vomiting, or diarrhea that is not controlled by your medication, call the clinic.  The clinic phone number is [phone]. Office hours are Monday-Friday 8:30am-5:00pm.  BELOW ARE SYMPTOMS THAT SHOULD BE REPORTED IMMEDIATELY:  *FEVER GREATER THAN 101.0 F  *CHILLS WITH OR WITHOUT FEVER  NAUSEA AND VOMITING THAT IS NOT CONTROLLED WITH YOUR NAUSEA MEDICATION  *UNUSUAL SHORTNESS OF BREATH  *UNUSUAL BRUISING OR BLEEDING  TENDERNESS IN MOUTH AND THROAT WITH OR WITHOUT PRESENCE OF ULCERS  *URINARY PROBLEMS  *BOWEL PROBLEMS  UNUSUAL RASH Items with * indicate a potential emergency and should be followed up as soon as possible. If you have an emergency after office hours please contact your primary care physician or go to the nearest emergency department.  Please call the clinic during office hours if you have any questions or concerns.   You may also contact the Patient Navigator at [phone] should you have any questions or need assistance in obtaining follow up care.      Resources For Cancer Patients and their Caregivers ? American Cancer Society: Can assist with transportation, wigs, general needs, runs Look Good Feel Better.        (934)604-6804 ? Cancer Care: Provides financial assistance, online support groups, medication/co-pay assistance.  1-800-813-HOPE 442-295-5986) ? [employer] Assists [place] cancer patients and their families through emotional , educational and financial  support.  (323)562-5694 ? [place] DSS Where to apply for food stamps, Medicaid and utility assistance. 989-377-3477 ? RCATS: Transportation to medical appointments. 973-523-7236 ? Social Security Administration: May apply for disability if have a Stage IV cancer. 847-513-3863 (419)347-5661 ? [employer], Disability and Transit Services: Assists with nutrition, care and transit needs. [phone]

## 2016-01-16 LAB — CANCER ANTIGEN 27.29: CA 27.29: 41.3 U/mL — AB (ref 0.0–38.6)

## 2016-01-16 LAB — CANCER ANTIGEN 15-3: CA 15-3: 38.9 U/mL — ABNORMAL HIGH (ref 0.0–25.0)

## 2016-01-17 ENCOUNTER — Encounter (HOSPITAL_COMMUNITY): Payer: Self-pay | Admitting: Hematology & Oncology

## 2016-01-17 ENCOUNTER — Ambulatory Visit (INDEPENDENT_AMBULATORY_CARE_PROVIDER_SITE_OTHER): Payer: 59 | Admitting: Podiatry

## 2016-01-17 ENCOUNTER — Ambulatory Visit (INDEPENDENT_AMBULATORY_CARE_PROVIDER_SITE_OTHER): Payer: 59

## 2016-01-17 ENCOUNTER — Encounter: Payer: Self-pay | Admitting: Podiatry

## 2016-01-17 VITALS — BP 127/86

## 2016-01-17 DIAGNOSIS — M79672 Pain in left foot: Secondary | ICD-10-CM

## 2016-01-17 DIAGNOSIS — M722 Plantar fascial fibromatosis: Secondary | ICD-10-CM | POA: Diagnosis not present

## 2016-01-17 DIAGNOSIS — M79671 Pain in right foot: Secondary | ICD-10-CM | POA: Diagnosis not present

## 2016-01-17 MED ORDER — TRIAMCINOLONE ACETONIDE 10 MG/ML IJ SUSP
10.0000 mg | Freq: Once | INTRAMUSCULAR | Status: AC
  Administered 2016-01-17: 10 mg

## 2016-01-17 MED ORDER — GABAPENTIN 300 MG PO CAPS: 300.0000 mg | ORAL_CAPSULE | Freq: Three times a day (TID) | ORAL | 3 refills | Status: DC

## 2016-01-17 NOTE — Patient Instructions (Signed)

## 2016-01-17 NOTE — Progress Notes (Signed)
   Subjective:    Patient ID: [name], female    DOB: 03/23/1966, 50 y.o.   MRN: [id number]  HPI Chief Complaint  Patient presents with  . Foot Pain    bilateral heel pain / sharp, like stepping on a rock off and on for 3 mo.  now constant.  . Toe Pain      Review of Systems  Constitutional: Positive for activity change, appetite change and fatigue.  Respiratory: Positive for shortness of breath.   Neurological: Positive for dizziness, weakness, light-headedness and numbness.  All other systems reviewed and are negative.      Objective:   Physical Exam        Assessment & Plan:

## 2016-01-19 NOTE — Progress Notes (Signed)
Subjective:     Patient ID: [name], female   DOB: 1966-01-19, 50 y.o.   MRN: [id number]  HPI patient presents with severe pain in the heel region bilateral with long-term history of cancer that does have metastasis present   Review of Systems  All other systems reviewed and are negative.      Objective:   Physical Exam  Constitutional: She is oriented to person, place, and time.  Cardiovascular: Intact distal pulses.   Musculoskeletal: Normal range of motion.  Neurological: She is oriented to person, place, and time.  Skin: Skin is warm.  Nursing note and vitals reviewed.  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

## 2016-01-21 ENCOUNTER — Ambulatory Visit (HOSPITAL_COMMUNITY): Payer: 59

## 2016-01-21 ENCOUNTER — Encounter (HOSPITAL_COMMUNITY): Payer: 59

## 2016-01-21 ENCOUNTER — Encounter (HOSPITAL_BASED_OUTPATIENT_CLINIC_OR_DEPARTMENT_OTHER): Payer: 59

## 2016-01-21 ENCOUNTER — Encounter (HOSPITAL_COMMUNITY): Payer: Self-pay

## 2016-01-21 VITALS — BP 124/83 | HR 86 | Temp 97.6°F | Resp 18

## 2016-01-21 DIAGNOSIS — C50919 Malignant neoplasm of unspecified site of unspecified female breast: Secondary | ICD-10-CM

## 2016-01-21 DIAGNOSIS — Z95828 Presence of other vascular implants and grafts: Secondary | ICD-10-CM

## 2016-01-21 DIAGNOSIS — C229 Malignant neoplasm of liver, not specified as primary or secondary: Secondary | ICD-10-CM | POA: Diagnosis not present

## 2016-01-21 DIAGNOSIS — C7951 Secondary malignant neoplasm of bone: Secondary | ICD-10-CM

## 2016-01-21 DIAGNOSIS — Z23 Encounter for immunization: Secondary | ICD-10-CM

## 2016-01-21 LAB — COMPREHENSIVE METABOLIC PANEL
ALBUMIN: 4.1 g/dL (ref 3.5–5.0)
ALT: 17 U/L (ref 14–54)
AST: 20 U/L (ref 15–41)
Alkaline Phosphatase: 77 U/L (ref 38–126)
Anion gap: 10 (ref 5–15)
BILIRUBIN TOTAL: 0.2 mg/dL — AB (ref 0.3–1.2)
BUN: 17 mg/dL (ref 6–20)
CHLORIDE: 98 mmol/L — AB (ref 101–111)
CO2: 29 mmol/L (ref 22–32)
CREATININE: 0.82 mg/dL (ref 0.44–1.00)
Calcium: 9.6 mg/dL (ref 8.9–10.3)
GFR calc Af Amer: >60 mL/min (ref >60)
GLUCOSE: 111 mg/dL — AB (ref 65–99)
Potassium: 4 mmol/L (ref 3.5–5.1)
Sodium: 137 mmol/L (ref 135–145)
TOTAL PROTEIN: 7.4 g/dL (ref 6.5–8.1)

## 2016-01-21 MED ORDER — HEPARIN SOD (PORK) LOCK FLUSH 100 UNIT/ML IV SOLN
INTRAVENOUS | Status: AC
  Filled 2016-01-21: qty 5

## 2016-01-21 MED ORDER — SODIUM CHLORIDE 0.9 % IV SOLN
4.0000 mg | Freq: Once | INTRAVENOUS | Status: AC
  Administered 2016-01-21: 4 mg via INTRAVENOUS
  Filled 2016-01-21: qty 5

## 2016-01-21 MED ORDER — HEPARIN SOD (PORK) LOCK FLUSH 100 UNIT/ML IV SOLN
500.0000 [IU] | Freq: Once | INTRAVENOUS | Status: AC
  Administered 2016-01-21: 500 [IU] via INTRAVENOUS

## 2016-01-21 MED ORDER — INFLUENZA VAC SPLIT QUAD 0.5 ML IM SUSY
PREFILLED_SYRINGE | INTRAMUSCULAR | Status: AC
  Filled 2016-01-21: qty 0.5

## 2016-01-21 MED ORDER — INFLUENZA VAC SPLIT QUAD 0.5 ML IM SUSY
0.5000 mL | PREFILLED_SYRINGE | Freq: Once | INTRAMUSCULAR | Status: AC
  Administered 2016-01-21: 0.5 mL via INTRAMUSCULAR

## 2016-01-21 MED ORDER — SODIUM CHLORIDE 0.9 % IV SOLN
INTRAVENOUS | Status: DC
  Administered 2016-01-21: 15:00:00 via INTRAVENOUS

## 2016-01-21 NOTE — Patient Instructions (Signed)
[place] at [facility] Discharge Instructions  RECOMMENDATIONS MADE BY THE CONSULTANT AND ANY TEST RESULTS WILL BE SENT TO YOUR REFERRING PHYSICIAN.  Zometa given today. Follow up as needed.  Thank you for choosing [place] at [facility] to provide your oncology and hematology care.  To afford each patient quality time with our provider, please arrive at least 15 minutes before your scheduled appointment time.   Beginning January 23rd 2017 lab work for the [facility] will be done in the  Main lab at [facility] on 1st floor. If you have a lab appointment with the [facility] please come in thru the  Main Entrance and check in at the main information desk  You need to re-schedule your appointment should you arrive 10 or more minutes late.  We strive to give you quality time with our providers, and arriving late affects you and other patients whose appointments are after yours.  Also, if you no show three or more times for appointments you may be dismissed from the clinic at the providers discretion.     Again, thank you for choosing [facility].  Our hope is that these requests will decrease the amount of time that you wait before being seen by our physicians.       _____________________________________________________________  Should you have questions after your visit to [facility], please contact our office at [phone] between the hours of 8:30 a.m. and 4:30 p.m.  Voicemails left after 4:30 p.m. will not be returned until the following business day.  For prescription refill requests, have your pharmacy contact our office.         Resources For Cancer Patients and their Caregivers ? American Cancer Society: Can assist with transportation, wigs, general needs, runs Look Good Feel Better.        (229)648-7811 ? Cancer Care: Provides financial assistance, online support groups, medication/co-pay  assistance.  1-800-813-HOPE (623) 557-2813) ? [employer] Assists [place] cancer patients and their families through emotional , educational and financial support.  (907) 040-6817 ? [place] DSS Where to apply for food stamps, Medicaid and utility assistance. (336) 102-3122 ? RCATS: Transportation to medical appointments. 803-792-0767 ? Social Security Administration: May apply for disability if have a Stage IV cancer. 865-216-4646 712 427 4451 ? [employer], Disability and Transit Services: Assists with nutrition, care and transit needs. [place] Support Programs: @10RELATIVEDAYS @ > Cancer Support Group  2nd Tuesday of the month 1pm-2pm, Journey Room  > Creative Journey  3rd Tuesday of the month 1130am-1pm, Journey Room  > Look Good Feel Better  1st Wednesday of the month 10am-12 noon, Journey Room (Call [facility] [place] to register [phone])

## 2016-01-21 NOTE — Progress Notes (Signed)
Zometa given today per orders. Patient tolerated well. No problems with infusion. Vitals stable , discharged home from clinic ambulatory.

## 2016-01-22 ENCOUNTER — Other Ambulatory Visit (HOSPITAL_COMMUNITY): Payer: Self-pay | Admitting: Oncology

## 2016-01-22 DIAGNOSIS — K219 Gastro-esophageal reflux disease without esophagitis: Secondary | ICD-10-CM

## 2016-02-03 ENCOUNTER — Encounter: Payer: Self-pay | Admitting: Podiatry

## 2016-02-03 ENCOUNTER — Ambulatory Visit (HOSPITAL_COMMUNITY)
Admission: RE | Admit: 2016-02-03 | Discharge: 2016-02-03 | Disposition: A | Discharge status: Home / Self Care | Payer: 59 | Source: Ambulatory Visit | Attending: Hematology & Oncology | Admitting: Hematology & Oncology

## 2016-02-03 ENCOUNTER — Ambulatory Visit (INDEPENDENT_AMBULATORY_CARE_PROVIDER_SITE_OTHER): Payer: 59 | Admitting: Podiatry

## 2016-02-03 DIAGNOSIS — C50919 Malignant neoplasm of unspecified site of unspecified female breast: Secondary | ICD-10-CM | POA: Insufficient documentation

## 2016-02-03 DIAGNOSIS — C7951 Secondary malignant neoplasm of bone: Secondary | ICD-10-CM | POA: Diagnosis not present

## 2016-02-03 DIAGNOSIS — M722 Plantar fascial fibromatosis: Secondary | ICD-10-CM | POA: Diagnosis not present

## 2016-02-03 LAB — GLUCOSE, CAPILLARY: GLUCOSE-CAPILLARY: 106 mg/dL — AB (ref 65–99)

## 2016-02-03 MED ORDER — FLUDEOXYGLUCOSE F - 18 (FDG) INJECTION
12.3100 | Freq: Once | INTRAVENOUS | Status: AC | PRN
  Administered 2016-02-03: 12.31 via INTRAVENOUS

## 2016-02-03 MED ORDER — TRIAMCINOLONE ACETONIDE 10 MG/ML IJ SUSP
10.0000 mg | Freq: Once | INTRAMUSCULAR | Status: AC
  Administered 2016-02-03: 10 mg

## 2016-02-05 ENCOUNTER — Encounter (HOSPITAL_COMMUNITY): Payer: Self-pay | Admitting: Oncology

## 2016-02-05 ENCOUNTER — Encounter (HOSPITAL_BASED_OUTPATIENT_CLINIC_OR_DEPARTMENT_OTHER): Payer: 59 | Admitting: Oncology

## 2016-02-05 ENCOUNTER — Encounter (HOSPITAL_BASED_OUTPATIENT_CLINIC_OR_DEPARTMENT_OTHER): Payer: 59

## 2016-02-05 VITALS — BP 133/75 | HR 65 | Temp 98.1°F | Resp 18 | Wt 245.6 lb

## 2016-02-05 DIAGNOSIS — C7931 Secondary malignant neoplasm of brain: Secondary | ICD-10-CM

## 2016-02-05 DIAGNOSIS — C787 Secondary malignant neoplasm of liver and intrahepatic bile duct: Secondary | ICD-10-CM

## 2016-02-05 DIAGNOSIS — C7951 Secondary malignant neoplasm of bone: Secondary | ICD-10-CM | POA: Diagnosis not present

## 2016-02-05 DIAGNOSIS — R5382 Chronic fatigue, unspecified: Secondary | ICD-10-CM

## 2016-02-05 DIAGNOSIS — C229 Malignant neoplasm of liver, not specified as primary or secondary: Secondary | ICD-10-CM | POA: Diagnosis not present

## 2016-02-05 DIAGNOSIS — C50919 Malignant neoplasm of unspecified site of unspecified female breast: Secondary | ICD-10-CM | POA: Diagnosis not present

## 2016-02-05 DIAGNOSIS — Z5112 Encounter for antineoplastic immunotherapy: Secondary | ICD-10-CM

## 2016-02-05 DIAGNOSIS — Z0000 Encounter for general adult medical examination without abnormal findings: Secondary | ICD-10-CM

## 2016-02-05 LAB — COMPREHENSIVE METABOLIC PANEL
ALBUMIN: 4 g/dL (ref 3.5–5.0)
ALT: 16 U/L (ref 14–54)
AST: 17 U/L (ref 15–41)
Alkaline Phosphatase: 76 U/L (ref 38–126)
Anion gap: 8 (ref 5–15)
BUN: 15 mg/dL (ref 6–20)
CHLORIDE: 100 mmol/L — AB (ref 101–111)
CO2: 30 mmol/L (ref 22–32)
Calcium: 9.5 mg/dL (ref 8.9–10.3)
Creatinine, Ser: 0.79 mg/dL (ref 0.44–1.00)
GFR calc Af Amer: >60 mL/min (ref >60)
Glucose, Bld: 109 mg/dL — ABNORMAL HIGH (ref 65–99)
POTASSIUM: 3.6 mmol/L (ref 3.5–5.1)
Sodium: 138 mmol/L (ref 135–145)
Total Bilirubin: 0.4 mg/dL (ref 0.3–1.2)
Total Protein: 7.3 g/dL (ref 6.5–8.1)

## 2016-02-05 LAB — CBC WITH DIFFERENTIAL/PLATELET
BASOS ABS: 0 10*3/uL (ref 0.0–0.1)
Basophils Relative: 0 %
EOS PCT: 0 %
Eosinophils Absolute: 0 10*3/uL (ref 0.0–0.7)
HEMATOCRIT: 37.9 % (ref 36.0–46.0)
Hemoglobin: 12 g/dL (ref 12.0–15.0)
LYMPHS ABS: 0.9 10*3/uL (ref 0.7–4.0)
LYMPHS PCT: 14 %
MCH: 28.3 pg (ref 26.0–34.0)
MCHC: 31.7 g/dL (ref 30.0–36.0)
MCV: 89.4 fL (ref 78.0–100.0)
Monocytes Absolute: 0.6 10*3/uL (ref 0.1–1.0)
Monocytes Relative: 10 %
NEUTROS PCT: 76 %
Neutro Abs: 4.9 10*3/uL (ref 1.7–7.7)
Platelets: 224 10*3/uL (ref 150–400)
RBC: 4.24 MIL/uL (ref 3.87–5.11)
RDW: 15 % (ref 11.5–15.5)
WBC: 6.3 10*3/uL (ref 4.0–10.5)

## 2016-02-05 MED ORDER — SODIUM CHLORIDE 0.9 % IV SOLN
6.0000 mg/kg | Freq: Once | INTRAVENOUS | Status: AC
  Administered 2016-02-05: 672 mg via INTRAVENOUS
  Filled 2016-02-05: qty 3.43

## 2016-02-05 MED ORDER — SODIUM CHLORIDE 0.9 % IV SOLN
Freq: Once | INTRAVENOUS | Status: AC
  Administered 2016-02-05: 12:00:00 via INTRAVENOUS

## 2016-02-05 MED ORDER — MORPHINE SULFATE ER 30 MG PO TBCR: 30.0000 mg | EXTENDED_RELEASE_TABLET | Freq: Two times a day (BID) | ORAL | 0 refills | Status: DC

## 2016-02-05 MED ORDER — ACETAMINOPHEN 325 MG PO TABS
ORAL_TABLET | ORAL | Status: AC
  Filled 2016-02-05: qty 2

## 2016-02-05 MED ORDER — DIPHENHYDRAMINE HCL 25 MG PO CAPS
50.0000 mg | ORAL_CAPSULE | Freq: Once | ORAL | Status: AC
  Administered 2016-02-05: 50 mg via ORAL

## 2016-02-05 MED ORDER — ACETAMINOPHEN 325 MG PO TABS
650.0000 mg | ORAL_TABLET | Freq: Once | ORAL | Status: AC
  Administered 2016-02-05: 650 mg via ORAL

## 2016-02-05 MED ORDER — DIPHENHYDRAMINE HCL 25 MG PO CAPS
ORAL_CAPSULE | ORAL | Status: AC
  Filled 2016-02-05: qty 2

## 2016-02-05 MED ORDER — SODIUM CHLORIDE 0.9 % IJ SOLN: 10.0000 mL | INTRAMUSCULAR | Status: DC | PRN

## 2016-02-05 MED ORDER — HEPARIN SOD (PORK) LOCK FLUSH 100 UNIT/ML IV SOLN: 500.0000 [IU] | Freq: Once | INTRAVENOUS | Status: DC | PRN

## 2016-02-05 MED ORDER — ONDANSETRON HCL 8 MG PO TABS: 8.0000 mg | ORAL_TABLET | Freq: Three times a day (TID) | ORAL | 1 refills | Status: DC | PRN

## 2016-02-05 MED ORDER — CAPECITABINE 500 MG PO TABS: ORAL_TABLET | ORAL | 1 refills | Status: DC

## 2016-02-05 MED ORDER — PROMETHAZINE HCL 25 MG PO TABS: 25.0000 mg | ORAL_TABLET | Freq: Four times a day (QID) | ORAL | 1 refills | Status: DC | PRN

## 2016-02-05 MED ORDER — LORAZEPAM 0.5 MG PO TABS: 0.5000 mg | ORAL_TABLET | Freq: Four times a day (QID) | ORAL | 1 refills | Status: DC | PRN

## 2016-02-05 MED ORDER — LAPATINIB DITOSYLATE 250 MG PO TABS: 1250.0000 mg | ORAL_TABLET | Freq: Every day | ORAL | 1 refills | Status: DC

## 2016-02-05 MED ORDER — OXYCODONE HCL 5 MG PO TABS: 5.0000 mg | ORAL_TABLET | ORAL | 0 refills | Status: DC | PRN

## 2016-02-05 MED ORDER — CAPECITABINE 150 MG PO TABS: ORAL_TABLET | ORAL | 1 refills | Status: DC

## 2016-02-05 NOTE — Progress Notes (Signed)
Patient tolerated infusion well.  VSS.  Patient ambulatory and stable upon discharge form clinic.

## 2016-02-05 NOTE — Progress Notes (Signed)
Allison Labrum, MD [place] [address]  Metastatic breast cancer [facility]) - Plan: NM Cardiac Muga Rest, capecitabine (XELODA) 500 MG tablet, capecitabine (XELODA) 150 MG tablet, lapatinib (TYKERB) 250 MG tablet  Breast cancer, stage 4, unspecified laterality (HCC) - Plan: LORazepam (ATIVAN) 0.5 MG tablet, oxyCODONE (OXY IR/ROXICODONE) 5 MG immediate release tablet, promethazine (PHENERGAN) 25 MG tablet, morphine (MS CONTIN) 30 MG 12 hr tablet, ondansetron (ZOFRAN) 8 MG tablet  Bone metastases (HCC) - Plan: LORazepam (ATIVAN) 0.5 MG tablet, oxyCODONE (OXY IR/ROXICODONE) 5 MG immediate release tablet, promethazine (PHENERGAN) 25 MG tablet, morphine (MS CONTIN) 30 MG 12 hr tablet  Preventative health care - Plan: LORazepam (ATIVAN) 0.5 MG tablet  CURRENT THERAPY: Herceptin/Faslodex/Zometa.  INTERVAL HISTORY: [name] 50 y.o. female returns for followup of Stage IV breast cancer to bones, ER+/HER2+    Breast cancer, stage 4 ([place])   04/25/2014 Initial Diagnosis    Breast cancer, stage 4      04/25/2014 Imaging    CT abdomen/pelvia with widespread metastatic disease to the liver, multiple lytic lesions throughout spine and pelvis. No FX or epidural tumor identified      04/26/2014 Imaging    CT head unremarkable      04/26/2014 Imaging    CT chest with no lung mass or pulmonary nodules, no adenopathy. Lytic bone lesions, right 2nd rib      04/27/2014 Initial Biopsy    U/S guided liver biopsy, lesion in anterior and inferior left hepatic lobe biopsied      04/27/2014 Pathology Results    Metastatic adenocarcinoma, CK7, ER+, patchy positivity with PR. Possible primary includes breast, less likely gynecologic      05/15/2014 Mammogram    BI-RADS CATEGORY  2: Benign Finding(s)      05/16/2014 PET scan    1. Intensely hypermetabolic hepatic metastasis. 2. Widespread hypermetabolic skeletal lesions. 3. No primary adenocarcinoma identified by FDG PET  imaging.      05/19/2014 Imaging    MUGA- Left ventricular ejection fracture greater than 70%.      05/21/2014 Breast MRI    No suspicious masses or enhancement within the breasts. No axillary adenopathy.      05/22/2014 - 07/03/2014 Antibody Plan    Herceptin/Perjeta/Tamoxifen      06/12/2014 - 07/03/2014 Chemotherapy    Taxotere added secondary to persistent abdominal and back pain      06/17/2014 - 06/19/2014 Hospital Admission    Neutropenia, fever, diarrhea, nausea, vomiting      06/20/2014 - 07/10/2014 Radiation Therapy    Dr. Thea [name] 12 fractions to L3-S3 (30 Gy) and left scapula (20 Gy).       07/03/2014 Adverse Reaction    Perjeta- induced diarrhea.  Perjeta discontinued      07/16/2014 - 07/20/2014 Hospital Admission    Electrolyte abnormalities, and diarrhea.  Suspect Perjeta-induced diarrhea.  Negative GI work-up.      07/24/2014 - 08/19/2015 Chemotherapy    Herceptin/Tamoxifen/Xgeva      08/21/2014 Imaging    MUGA- Left ventricular ejection fraction equals 71%.      08/24/2014 PET scan    Dramatic reduction in metabolic activity of the widespread liver metastasis. Liver metastasis now have metabolic activity equal to background normal liver activity. Liver has a nodular contour. Marked reduction in metabolic activity of skeletal lesions..      10/05/2014 Progression    Widespread metastatic disease to the brain as described. Between 20 and 30  intracranial metastatic deposits are now seen. No midline shift or incipient herniation      10/09/2014 - 10/26/2014 Radiation Therapy    Whole Brain XRT      11/14/2014 Imaging    MUGA- LVEF 67%      02/13/2015 Imaging    MUGA- LVEF 59%      02/15/2015 Treatment Plan Change    Due to declining LVEF, will hold Herceptin per PI guidelines.      04/12/2015 -  Chemotherapy    Herceptin restarted      06/02/2015 - 06/08/2015 Hospital Admission    Pneumonia      07/05/2015 Progression     PET/CT concern for mild  progression of skeletal metastasis with several lesions within the spine and 1 lesion in the Left iliac wing with mild to moderate metabolic activity new from prior. Rising CA 27-29      07/16/2015 - 10/23/2015 Anti-estrogen oral therapy    Arimidex      07/16/2015 Imaging    MRI brain with satisfactory post treatment apperance of brain. interval resolved enhancing R caudate metastasis, minimal punctate residual enhancing metastatic disease at the inferior L cerebellum. No new metastatic disease or new intracranial abnormality      07/19/2015 Treatment Plan Change    Discontinue Tamoxifen, Zoladex plus Arimidex.       08/27/2015 Procedure    Laparoscopic bilateral salpingo-oophorectomy by Dr. Gaetano [name]      10/17/2015 PET scan    Osseous metastatic disease appears slightly progressive based on a new right scapular lesion and increased uptake within lesions in the thoracic spine, left iliac wing and  proximal right femur.      10/17/2015 Progression    Slight progression on PET scan imaging.      10/18/2015 Imaging    REsolved enhancing metastatic disease to the brain status post WBXRT      10/23/2015 -  Adjuvant Chemotherapy    Faslodex loading followed by maintenance dose.  (Herceptin continued)      11/04/2015 Imaging    MUGA- LEFT ventricular ejection fraction 51% slightly decreased in a 57% on the previous exam.      12/24/2015 Treatment Plan Change    Zometa every 28 days.  Xgeva discontinued.        01/01/2016 Imaging    MUGA- Left ventricular ejection fraction equals 57.9%. This is increased from 51.1% previously.      02/03/2016 PET scan    1. Mixed metabolic changes in the scattered hypermetabolic sclerotic osseous metastases throughout the axial and proximal appendicular skeleton as detailed above. 2. No new sites of hypermetabolic metastatic disease. Stable pseudo-cirrhotic appearance of the liver due to treated liver metastases with no hypermetabolic liver  metastases.       02/03/2016 Progression    PET shows mixed osseous response.  Some lesions more hypermetabolic, others improved.      02/05/2016 Treatment Plan Change    D/C Herceptin.  Faslodex as scheduled on 02/12/2016, then discontinued.  Continue Zometa.      02/05/2016 Treatment Plan Change    Prescriptions for Xeloda 7 days on and 7 days off and Tykerb printed and provided for authorization.      She recently underwent bilateral injections of plantar fascia for plantar fasciitis. She notes improvement.  She needs refills on a few medications and this is addressed today.  She denies any new/acute complaints.  Review of Systems  Constitutional: Negative.  Negative for chills, fever and weight loss.  HENT:  Negative.   Eyes: Negative.   Respiratory: Negative.   Cardiovascular: Negative.   Gastrointestinal: Negative.   Genitourinary: Negative.   Musculoskeletal: Negative.   Skin: Negative.   Neurological: Negative.  Negative for weakness.  Endo/Heme/Allergies: Negative.   Psychiatric/Behavioral: Negative.     Past Medical History:  Diagnosis Date  . Anticoagulated    xarelto  . Anxiety   . Breast cancer metastasized to multiple sites [facility])    liver, brain, and bone  . Breast cancer, stage 4 [facility]) oncologist-  dr Larene [place] ([facility])   dx 12/ 2015 -- breast cancer Stage 4,  ER/HER2 +,  w/  liver, brain and  bone mets/  chemotherapy and radiation therapy  . Chronic pain syndrome    secondary to cancer   . Depression   . Drug-induced cardiomyopathy ([place])    per last MUGA (08-08-2015), ef 56.5/ per last echo 05-18-2014 ef 60-65%  . Family history of prostate cancer   . GERD (gastroesophageal reflux disease)   . History of colon polyps    07-13-2013  benign  . History of DVT (deep vein thrombosis)    07-09-2014  upper right extremity-  RIJ and right subclavian--  resolved  . History of gastritis    erosive  . History of pneumonia    HCAP  06-07-2015--  resolved per cxr 07-04-2015  . History of radiation therapy    12 fractions to L3 - S3, 30Gy and left spacula 20Gy (06-20-2014 to 07-10-2014) //  whole brain rxt (10-09-2014 to 10-26-2014)  . History of small bowel obstruction    S/P RESECTION 2008  . Migraine   . PONV (postoperative nausea and vomiting)    pt states scope patch does well    Past Surgical History:  Procedure Laterality Date  . BREAST REDUCTION SURGERY  03/17/2011   Procedure: MAMMARY REDUCTION BILATERAL (BREAST);  Surgeon: Mary A Contogiannis;  Location: [place];  Service: Plastics;  Laterality: Bilateral;  . CATARACT EXTRACTION W/ INTRAOCULAR LENS  IMPLANT, BILATERAL  2008  . CERVICAL FUSION  2003   C5 -- C6  . COLONOSCOPY N/A 07/13/2013   Procedure: COLONOSCOPY;  Surgeon: Rogene Houston, MD;  [facility];  Service: Endoscopy;  Laterality: N/A;  930  . COLONOSCOPY N/A 11/26/2014   Procedure: COLONOSCOPY;  Surgeon: Rogene Houston, MD;  [facility];  Service: Endoscopy;  Laterality: N/A;  730  . D & C HYSTEROSCOPY/  RESECTION ENDOMETRIAL MASS/  [place] ENDOMETRIAL ABLATION  04-11-2010  . DX LAPAROSCOPY W/ PARTIAL SMALL BOWEL RESECTION AND APPENDECTOMY  04-13-2007  . ESOPHAGOGASTRODUODENOSCOPY N/A 05/25/2014   Procedure: ESOPHAGOGASTRODUODENOSCOPY (EGD);  Surgeon: Rogene Houston, MD;  [facility];  Service: Endoscopy;  Laterality: N/A;  155  . KNEE ARTHROSCOPY Right 2005  . LAPAROSCOPIC ASSISTED VAGINAL HYSTERECTOMY  10-13-2010   w/ Bx Left Fallopian tube and Aspiration Right Ovarian Cyst  . LAPAROSCOPIC CHOLECYSTECTOMY  11-17-2002  . LAPAROSCOPIC SALPINGO OOPHERECTOMY Bilateral 08/26/2015   Procedure: LAPAROSCOPIC SALPINGO OOPHORECTOMY, bilateral;  Surgeon: Everlene Farrier, MD;  Location: [place];  Service: Gynecology;  Laterality: Bilateral;  . PORTACATH PLACEMENT  05-17-2014  . SHOULDER ARTHROSCOPY WITH ROTATOR CUFF REPAIR Right 2002   . TRANSTHORACIC ECHOCARDIOGRAM  05-18-2014   ef 60-65%//   last MUGA  (08-08-2015)  ef 56.6%      Family History  Problem Relation Age of Onset  . Diabetes Father   . Heart attack Maternal Grandmother 30  multiple over lifetime.  . Cancer Maternal Grandmother 55    NOS  . Prostate cancer Maternal Grandfather     dx in his 10s  . Lung cancer Paternal Grandfather     dx <50  . Lymphoma Maternal Aunt     dx in her 25s  . Melanoma Cousin 77    maternal first cousin  . Brain cancer Cousin     paternal first cousin dx under 61  . Prostate cancer Other     MGF's father  . Colon cancer Other     MGM's mother    Social History   Social History  . Marital status: Married    Spouse name: N/A  . Number of children: N/A  . Years of education: N/A   Social History Main Topics  . Smoking status: Former Smoker    Types: Cigarettes    Quit date: 08/20/1994  . Smokeless tobacco: Never Used  . Alcohol use Yes     Comment: Occasionally  . Drug use: No  . Sexual activity: Not Asked   Other Topics Concern  . None   Social History Narrative  . None     PHYSICAL EXAMINATION  ECOG PERFORMANCE STATUS: 0 - Asymptomatic  There were no vitals filed for this visit.  Vitals - 1 value per visit 0/07/7046  SYSTOLIC 889  DIASTOLIC 75  Pulse 65  Temperature 98.1  Respirations 18    GENERAL:alert, no distress, comfortable, cooperative, obese, smiling and in chemo-recliner, accompanied by her husband, Thayer Jew. SKIN: skin color, texture, turgor are normal, no rashes or significant lesions HEAD: Normocephalic, No masses, lesions, tenderness or abnormalities EYES: normal, EOMI, Conjunctiva are pink and non-injected EARS: External ears normal OROPHARYNX:lips, buccal mucosa, and tongue normal and mucous membranes are moist  NECK: supple, trachea midline LYMPH:  no palpable lymphadenopathy BREAST:not examined LUNGS: clear to auscultation  HEART: regular rate &  rhythm ABDOMEN:abdomen soft and normal bowel sounds BACK: Back symmetric, no curvature. EXTREMITIES:less then 2 second capillary refill, no joint deformities, effusion, or inflammation, no skin discoloration, no cyanosis  NEURO: alert & oriented x 3 with fluent speech, no focal motor/sensory deficits, gait normal   LABORATORY DATA: CBC    Component Value Date/Time   WBC 6.3 02/05/2016 1128   RBC 4.24 02/05/2016 1128   HGB 12.0 02/05/2016 1128   HCT 37.9 02/05/2016 1128   PLT 224 02/05/2016 1128   MCV 89.4 02/05/2016 1128   MCH 28.3 02/05/2016 1128   MCHC 31.7 02/05/2016 1128   RDW 15.0 02/05/2016 1128   LYMPHSABS 0.9 02/05/2016 1128   MONOABS 0.6 02/05/2016 1128   EOSABS 0.0 02/05/2016 1128   BASOSABS 0.0 02/05/2016 1128      Chemistry      Component Value Date/Time   NA 138 02/05/2016 1128   K 3.6 02/05/2016 1128   CL 100 (L) 02/05/2016 1128   CO2 30 02/05/2016 1128   BUN 15 02/05/2016 1128   CREATININE 0.79 02/05/2016 1128      Component Value Date/Time   CALCIUM 9.5 02/05/2016 1128   ALKPHOS 76 02/05/2016 1128   AST 17 02/05/2016 1128   ALT 16 02/05/2016 1128   BILITOT 0.4 02/05/2016 1128        PENDING LABS:   RADIOGRAPHIC STUDIES:  Nm Pet Image Restag (ps) Skull Base To Thigh  Result Date: 02/03/2016 CLINICAL DATA:  Subsequent treatment strategy for stage IV breast cancer with bone metastases, presenting for restaging. EXAM: NUCLEAR MEDICINE PET SKULL BASE  TO THIGH TECHNIQUE: 12.3 mCi F-18 FDG was injected intravenously. Full-ring PET imaging was performed from the skull base to thigh after the radiotracer. CT data was obtained and used for attenuation correction and anatomic localization. FASTING BLOOD GLUCOSE:  Value: 100 seconds mg/dl COMPARISON:  10/17/2015 PET-CT. FINDINGS: NECK No hypermetabolic lymph nodes in the neck. Symmetric hypermetabolism in the glottis without CT correlate, probably physiologic. CHEST No hypermetabolic axillary, mediastinal or  hilar nodes. Right internal jugular MediPort terminates in the lower third of the superior vena cava. No acute consolidative airspace disease or significant pulmonary nodules. ABDOMEN/PELVIS Stable pseudocirrhotic appearance of the liver due to the prior treated liver metastases. No hypermetabolic foci in the liver. Low-attenuation 1.5 cm focus in the posterior right liver lobe is stable and non hypermetabolic. Cholecystectomy. Stable postsurgical changes in the distal small bowel. Hysterectomy. No abnormal hypermetabolic activity within the pancreas, adrenal glands, or spleen. No hypermetabolic lymph nodes in the abdomen or pelvis. SKELETON There are mixed changes in the hypermetabolic sclerotic osseous metastases throughout the axial and proximal appendicular skeleton. The following sclerotic osseous metastases demonstrate increased metabolism: - left central sacral metastasis with max SUV 6.4, previously 3.5 - left T10 posterior element metastasis with max SUV 9.9, previously 6.3 - right sacral metastasis with max SUV 12.4, previously 6.7 The following sclerotic osseous metastases demonstrate decreased metabolism: - right femoral head metastasis with max SUV 11.1, previously 11.5 - left iliac wing metastasis with max SUV 12.6, previously 13.4 - right posterior T6 metastasis with max SUV 11.8, previously 15.8 - medial inferior left clavicular metastasis with max SUV 5.3, previously 7.6 No new hypermetabolic osseous metastases. IMPRESSION: 1. Mixed metabolic changes in the scattered hypermetabolic sclerotic osseous metastases throughout the axial and proximal appendicular skeleton as detailed above. 2. No new sites of hypermetabolic metastatic disease. Stable pseudo-cirrhotic appearance of the liver due to treated liver metastases with no hypermetabolic liver metastases. Electronically Signed   By: Ilona Sorrel M.D.   On: 02/03/2016 13:34   Dg Foot 2 Views Left  Result Date: 02/05/2016 See progress note.  Dg  Foot 2 Views Right  Result Date: 02/05/2016 See progress note.    PATHOLOGY:    ASSESSMENT AND PLAN:  Breast cancer, stage 4 (HCC) Stage IV adenocarcinoma of the breast, ER+, HER 2 + disease.  S/P BSO by Dr. Gaetano [name] on 08/27/2015.  Oncology history is updated.  Current therapy is Faslodex/Herceptin/Zometa.   Labs today: CBC diff, CMET, and CA 15-3.  I personally reviewed and went over laboratory results with the patient.  The results are noted within this dictation.  Next MUGA is due in mid-November 2017.  Order is placed.  I personally reviewed and went over radiographic studies with the patient.  The results are noted within this dictation.  PET scan results are reviewed.  Osseous evaluation demonstrates a mixed response.  This, in conjunction with her slowly rising CA 27.29 and CA 15-3 is suspicious for progression of disease/impending progression of disease.  As a result, we will change therapy to Xeloda 7 days on and 7 days off in addition to Tykerb.  We reviewed the risks, benefits, alternatives, and side effects of this therapy.  She is provided printed information regarding these therapies.  We will continue with Zometa.  Dosing for Xeloda is 1000 mg/m2 BID 7 days on and 7 days off = 2300 mg PO BID.  Tykerb dosing is 1250 mg PO daily.  Prescriptions for both medications are printed and given to Angie.  She will be referred for chemotherapy teaching and a message is sent to Nurse Navigator, Anderson Malta.  Will continue Faslodex next week, and then subsequently discontinue with aforementioned changes in therapy.  Return in 3 weeks for follow-up.   ORDERS PLACED FOR THIS ENCOUNTER: Orders Placed This Encounter  Procedures  . NM Cardiac Muga Rest    MEDICATIONS PRESCRIBED THIS ENCOUNTER: Meds ordered this encounter  Medications  . LORazepam (ATIVAN) 0.5 MG tablet    Sig: Take 1 tablet (0.5 mg total) by mouth every 6 (six) hours as needed for anxiety. Reported on 08/16/2015     Dispense:  30 tablet    Refill:  1    Order Specific Question:   Supervising Provider    Answer:   Patrici Ranks [id number]  . oxyCODONE (OXY IR/ROXICODONE) 5 MG immediate release tablet    Sig: Take 1-2 tablets (5-10 mg total) by mouth every 4 (four) hours as needed for moderate pain.    Dispense:  60 tablet    Refill:  0    Order Specific Question:   Supervising Provider    Answer:   Patrici Ranks [id number]  . promethazine (PHENERGAN) 25 MG tablet    Sig: Take 1 tablet (25 mg total) by mouth every 6 (six) hours as needed for nausea or vomiting.    Dispense:  30 tablet    Refill:  1    Order Specific Question:   Supervising Provider    Answer:   Patrici Ranks [id number]  . morphine (MS CONTIN) 30 MG 12 hr tablet    Sig: Take 1 tablet (30 mg total) by mouth every 12 (twelve) hours.    Dispense:  60 tablet    Refill:  0    Order Specific Question:   Supervising Provider    Answer:   Patrici Ranks [id number]  . ondansetron (ZOFRAN) 8 MG tablet    Sig: Take 1 tablet (8 mg total) by mouth every 8 (eight) hours as needed for nausea or vomiting.    Dispense:  30 tablet    Refill:  1    Order Specific Question:   Supervising Provider    Answer:   Patrici Ranks [id number]  . capecitabine (XELODA) 500 MG tablet    Sig: Take 2000 mg PO daily, 7 days on and 7 days off    Dispense:  56 tablet    Refill:  1    Take 2300 mg (total) PO daily 7 days on and 7 days off    Order Specific Question:   Supervising Provider    Answer:   Patrici Ranks [id number]  . capecitabine (XELODA) 150 MG tablet    Sig: Take 300 mg PO BID 7 days on and 7 days off    Dispense:  56 tablet    Refill:  1    Take 2300 mg (total) PO daily 7 days on and 7 days off    Order Specific Question:   Supervising Provider    Answer:   Patrici Ranks [id number]  . lapatinib (TYKERB) 250 MG tablet    Sig: Take 5 tablets (1,250 mg total) by mouth daily.    Dispense:  140 tablet    Refill:  1     Order Specific Question:   Supervising Provider    Answer:   Patrici Ranks [id number]    THERAPY PLAN:  Change in therapy to Xeloda/Tykerb.  Will continue with Zometa.  All questions were answered.  The patient knows to call the clinic with any problems, questions or concerns. We can certainly see the patient much sooner if necessary.  Patient and plan discussed with Dr. Ancil Linsey and she is in agreement with the aforementioned.   This note is electronically signed by: Doy Mince 02/05/2016 5:43 PM

## 2016-02-05 NOTE — Assessment & Plan Note (Addendum)
Stage IV adenocarcinoma of the breast, ER+, HER 2 + disease.  S/P BSO by Dr. Gaetano Net on 08/27/2015.  Oncology history is updated.  Current therapy is Faslodex/Herceptin/Zometa.   Labs today: CBC diff, CMET, and CA 15-3.  I personally reviewed and went over laboratory results with the patient.  The results are noted within this dictation.  Next MUGA is due in mid-November 2017.  Order is placed.  I personally reviewed and went over radiographic studies with the patient.  The results are noted within this dictation.  PET scan results are reviewed.  Osseous evaluation demonstrates a mixed response.  This, in conjunction with her slowly rising CA 27.29 and CA 15-3 is suspicious for progression of disease/impending progression of disease.  As a result, we will change therapy to Xeloda 7 days on and 7 days off in addition to Tykerb.  We reviewed the risks, benefits, alternatives, and side effects of this therapy.  She is provided printed information regarding these therapies.  We will continue with Zometa.  Dosing for Xeloda is 1000 mg/m2 BID 7 days on and 7 days off = 2300 mg PO BID.  Tykerb dosing is 1250 mg PO daily.  Prescriptions for both medications are printed and given to Angie.  She will be referred for chemotherapy teaching and a message is sent to Nurse Navigator, Anderson Malta.  Will continue Faslodex next week, and then subsequently discontinue with aforementioned changes in therapy.  Return in 3 weeks for follow-up.

## 2016-02-05 NOTE — Patient Instructions (Signed)
[facility] Discharge Instructions for Patients Receiving Chemotherapy   Beginning January 23rd 2017 lab work for the Cancer Center will be done in the  Main lab at [facility] on 1st floor. If you have a lab appointment with the Cancer Center please come in thru the  Main Entrance and check in at the main information desk   Today you received the following chemotherapy agent: Herceptin   If you develop nausea and vomiting, or diarrhea that is not controlled by your medication, call the clinic.  The clinic phone number is [phone]. Office hours are Monday-Friday 8:30am-5:00pm.  BELOW ARE SYMPTOMS THAT SHOULD BE REPORTED IMMEDIATELY:  *FEVER GREATER THAN 101.0 F  *CHILLS WITH OR WITHOUT FEVER  NAUSEA AND VOMITING THAT IS NOT CONTROLLED WITH YOUR NAUSEA MEDICATION  *UNUSUAL SHORTNESS OF BREATH  *UNUSUAL BRUISING OR BLEEDING  TENDERNESS IN MOUTH AND THROAT WITH OR WITHOUT PRESENCE OF ULCERS  *URINARY PROBLEMS  *BOWEL PROBLEMS  UNUSUAL RASH Items with * indicate a potential emergency and should be followed up as soon as possible. If you have an emergency after office hours please contact your primary care physician or go to the nearest emergency department.  Please call the clinic during office hours if you have any questions or concerns.   You may also contact the Patient Navigator at [phone] should you have any questions or need assistance in obtaining follow up care.      Resources For Cancer Patients and their Caregivers ? American Cancer Society: Can assist with transportation, wigs, general needs, runs Look Good Feel Better.        1-888-227-6333 ? Cancer Care: Provides financial assistance, online support groups, medication/co-pay assistance.  1-800-813-HOPE (4673) ? [facility] Assists [place] cancer patients and their families through emotional , educational and financial support.   336-427-4357 ? [place] DSS Where to apply for food stamps, Medicaid and utility assistance. 336-342-1394 ? [facility]: Transportation to medical appointments. 336-347-2287 ? Social Security Administration: May apply for disability if have a Stage IV cancer. 336-342-7796 1-800-772-1213 ? [place] Aging, Disability and Transit Services: Assists with nutrition, care and transit needs. 336-349-2343          

## 2016-02-05 NOTE — Patient Instructions (Signed)
[place] at [facility] Discharge Instructions  RECOMMENDATIONS MADE BY THE CONSULTANT AND ANY TEST RESULTS WILL BE SENT TO YOUR REFERRING PHYSICIAN.  You were seen today by Kirby Crigler PA-C.  Stop the Herceptin.  Faslodex next week. Stop after this dose. Continue Zometa every 4 weeks. Refills given. Chemo Teaching soon, and MUGA in NOvember. Return in 3 weeks for follow up.   Thank you for choosing [place] at [facility] to provide your oncology and hematology care.  To afford each patient quality time with our provider, please arrive at least 15 minutes before your scheduled appointment time.   Beginning January 23rd 2017 lab work for the [facility] will be done in the  Main lab at [facility] on 1st floor. If you have a lab appointment with the [facility] please come in thru the  Main Entrance and check in at the main information desk  You need to re-schedule your appointment should you arrive 10 or more minutes late.  We strive to give you quality time with our providers, and arriving late affects you and other patients whose appointments are after yours.  Also, if you no show three or more times for appointments you may be dismissed from the clinic at the providers discretion.     Again, thank you for choosing [facility].  Our hope is that these requests will decrease the amount of time that you wait before being seen by our physicians.       _____________________________________________________________  Should you have questions after your visit to [facility], please contact our office at [phone] between the hours of 8:30 a.m. and 4:30 p.m.  Voicemails left after 4:30 p.m. will not be returned until the following business day.  For prescription refill requests, have your pharmacy contact our office.         Resources For Cancer Patients and their Caregivers ? American Cancer Society: Can  assist with transportation, wigs, general needs, runs Look Good Feel Better.        214-761-4067 ? Cancer Care: Provides financial assistance, online support groups, medication/co-pay assistance.  1-800-813-HOPE 801-201-1733) ? [place] Assists [place] cancer patients and their families through emotional , educational and financial support.  772 376 8202 ? [place] DSS Where to apply for food stamps, Medicaid and utility assistance. (515)296-0838 ? RCATS: Transportation to medical appointments. (934) 549-0699 ? Social Security Administration: May apply for disability if have a Stage IV cancer. ([phone] 309-429-2374 ? [employer], Disability and Transit Services: Assists with nutrition, care and transit needs. [place] Support Programs: @10RELATIVEDAYS @ > Cancer Support Group  2nd Tuesday of the month 1pm-2pm, Journey Room  > Creative Journey  3rd Tuesday of the month 1130am-1pm, Journey Room  > Look Good Feel Better  1st Wednesday of the month 10am-12 noon, Journey Room (Call [place] to register [phone])

## 2016-02-05 NOTE — Progress Notes (Signed)
Subjective:     Patient ID: [name], female   DOB: [birth year], 50 y.o.   MRN: [id number]  HPI patient states my heels continue to bother me and make it hard to walk comfortably. They have improved with medication but I did have some trouble with the gabapentin making me tired during the day   Review of Systems     Objective:   Physical Exam Neurovascular status intact muscle strength adequate with inflammatory changes plantar heel region bilateral with patient doing well on gabapentin at night but not during the day and having trouble after periods of sitting or when waking up in the morning    Assessment:     Continue plantar fasciitis left over right with chemotherapy as a complicating factor and malignant cancer    Plan:     Reviewed all conditions and at this time carefully reinjected the plantar fascia bilateral 3 mg Kenalog 5 mg Xylocaine and we'll just take gabapentin at night. We may need to add a little bit and may have to consider lower dose during the day but we'll hold off at this time and I also dispensed night splint with all instructions on usage

## 2016-02-06 ENCOUNTER — Other Ambulatory Visit (HOSPITAL_COMMUNITY): Payer: Self-pay | Admitting: Pharmacist

## 2016-02-06 LAB — CANCER ANTIGEN 15-3: CA 15-3: 39 U/mL — ABNORMAL HIGH (ref 0.0–25.0)

## 2016-02-10 ENCOUNTER — Telehealth (HOSPITAL_COMMUNITY): Payer: Self-pay | Admitting: Hematology & Oncology

## 2016-02-10 ENCOUNTER — Other Ambulatory Visit (HOSPITAL_COMMUNITY): Payer: Self-pay | Admitting: Pharmacist

## 2016-02-10 NOTE — Telephone Encounter (Signed)
TWO PH CALLS MADE TO [employer] RE XELODA . [place] TO KIM THE 1ST TIME SHE STATED IT HAS TO [place] AN APPEALS DEPT FAX # [phone]. CALLED BACK [facility] TO JASMINE SHE STATED IT NEEDED TO GO TO THE PRE D DEPT [phone]. FAXED TO BOTH WILL WAIT FOR A RESPONSE FOR ONE OR BOTH SINCE I CANT GET THE SAME/CORRECT ANSWER FROM CS

## 2016-02-11 NOTE — Patient Instructions (Signed)
TYKERB (lapatinib) is used with a medicine called capecitabine (brand name Xeloda) for the treatment of people with advanced or metastatic breast cancer whose tumors overexpress HER2 and who have received prior therapy including an anthracycline, a taxane, and Herceptin (trastuzumab). Tumors that are HER2 positive make a large amount of a protein called human epidermal growth factor receptor-2. Common side effects TYKERB plus capecitabine can cause certain common, less serious side effects. Discuss ways to relieve these side effects with your doctor. . Diarrhea (less than severe) . Red, painful hands and feet . Nausea . Rash . Vomiting . Tiredness or weakness . Mouth sores . Indigestion . Dry skin . Back pain . Shortness of breath . Difficulty sleeping Tell your doctor if you have any side effect that bothers you or that does not go away.   Xeloda - diarrhea, hand-foot syndrome (hands/feet can get red/tender/and skin can peel). Avoid friction and hot environments on hands/feet. Wear cotton socks. Lotion twice a day to hands/fingers/feet/toes with Udder cream. Mucositis (inflammation of any mucosal membrane can develop -this can occur in the throat/mouth). Mouth sores, nausea/vomiting, anemia, fatigue can also develop. Take Imodium if diarrhea develops and contact us immediately - we will give you further instructions on how to take your Imodium. Xeloda usually comes with a teaching packet from the mail order/specialty pharmacy that will supply you with the drug that is really informative about diarrhea and hand-foot syndrome. No pregnant, child bearing age people, or animals should come into contact with this drug. Even though this is in pill form - it is still very powerful!!! If you should be instructed to discontinue taking this drug, bring the drug into the [facility] and we will dispose of it in a safe manner. Chemotherapy is a biohazard and must be disposed of properly. Do not touch  this pill much. It would be best if the caregiver wore gloves while handling. Do not put this pill in with the rest of your pills in a pill box. Keep them in a separate pill box. If you are taking Coumadin while taking this drug, we will need to monitor your PT/INR (lab) closely because Xeloda can increase the effectiveness of Coumadin. You should take this medication within 30 minutes after eating your am and pm meals.

## 2016-02-12 ENCOUNTER — Encounter (HOSPITAL_COMMUNITY): Payer: 59 | Attending: Oncology

## 2016-02-12 ENCOUNTER — Inpatient Hospital Stay (HOSPITAL_COMMUNITY): Payer: 59

## 2016-02-12 ENCOUNTER — Encounter (HOSPITAL_COMMUNITY): Payer: Self-pay

## 2016-02-12 ENCOUNTER — Encounter (HOSPITAL_COMMUNITY): Payer: 59

## 2016-02-12 ENCOUNTER — Ambulatory Visit (HOSPITAL_COMMUNITY): Payer: 59 | Admitting: Hematology & Oncology

## 2016-02-12 ENCOUNTER — Ambulatory Visit (HOSPITAL_COMMUNITY): Payer: 59

## 2016-02-12 VITALS — BP 135/65 | HR 66

## 2016-02-12 DIAGNOSIS — Z5111 Encounter for antineoplastic chemotherapy: Secondary | ICD-10-CM

## 2016-02-12 DIAGNOSIS — C7951 Secondary malignant neoplasm of bone: Secondary | ICD-10-CM | POA: Insufficient documentation

## 2016-02-12 DIAGNOSIS — C229 Malignant neoplasm of liver, not specified as primary or secondary: Secondary | ICD-10-CM | POA: Insufficient documentation

## 2016-02-12 DIAGNOSIS — C50919 Malignant neoplasm of unspecified site of unspecified female breast: Secondary | ICD-10-CM

## 2016-02-12 DIAGNOSIS — C787 Secondary malignant neoplasm of liver and intrahepatic bile duct: Secondary | ICD-10-CM

## 2016-02-12 DIAGNOSIS — C7931 Secondary malignant neoplasm of brain: Secondary | ICD-10-CM

## 2016-02-12 MED ORDER — FULVESTRANT 250 MG/5ML IM SOLN
500.0000 mg | INTRAMUSCULAR | Status: DC
  Administered 2016-02-12: 500 mg via INTRAMUSCULAR

## 2016-02-12 NOTE — Progress Notes (Signed)
[name] presents today for injection per MD orders. Faslodex 500mg   administered IM  In the bilateral upper buttocks Administration without incident. Patient tolerated well.   Vitals stable, discharged home from clinic, ambulatory with Husband.

## 2016-02-12 NOTE — Patient Instructions (Signed)
[place] at [facility] Discharge Instructions  RECOMMENDATIONS MADE BY THE CONSULTANT AND ANY TEST RESULTS WILL BE SENT TO YOUR REFERRING PHYSICIAN.  Faslodex given Follow up as scheduled.  Thank you for choosing [place] at [facility] to provide your oncology and hematology care.  To afford each patient quality time with our provider, please arrive at least 15 minutes before your scheduled appointment time.   Beginning January 23rd 2017 lab work for the [facility] will be done in the  Main lab at [facility] on 1st floor. If you have a lab appointment with the [place] [facility] please come in thru the  Main Entrance and check in at the main information desk  You need to re-schedule your appointment should you arrive 10 or more minutes late.  We strive to give you quality time with our providers, and arriving late affects you and other patients whose appointments are after yours.  Also, if you no show three or more times for appointments you may be dismissed from the clinic at the providers discretion.     Again, thank you for choosing [facility].  Our hope is that these requests will decrease the amount of time that you wait before being seen by our physicians.       _____________________________________________________________  Should you have questions after your visit to [facility], please contact our office at [phone] between the hours of 8:30 a.m. and 4:30 p.m.  Voicemails left after 4:30 p.m. will not be returned until the following business day.  For prescription refill requests, have your pharmacy contact our office.         Resources For Cancer Patients and their Caregivers ? American Cancer Society: Can assist with transportation, wigs, general needs, runs Look Good Feel Better.        720-742-2821 ? Cancer Care: Provides financial assistance, online support groups, medication/co-pay  assistance.  1-800-813-HOPE [phone]) ? [place] Assists [place] cancer patients and their families through emotional , educational and financial support.  ([phone] [phone] ? [place] DSS Where to apply for food stamps, Medicaid and utility assistance. 856-870-9673 ? RCATS: Transportation to medical appointments. 506-277-9937 ? Social Security Administration: May apply for disability if have a Stage IV cancer. 419-776-0175 717-816-9910 ? [employer], Disability and Transit Services: Assists with nutrition, care and transit needs. [place] Support Programs: @10RELATIVEDAYS @ > Cancer Support Group  2nd Tuesday of the month 1pm-2pm, Journey Room  > Creative Journey  3rd Tuesday of the month 1130am-1pm, Journey Room  > Look Good Feel Better  1st Wednesday of the month 10am-12 noon, Journey Room (Call [place] to register [phone])

## 2016-02-13 ENCOUNTER — Other Ambulatory Visit (HOSPITAL_COMMUNITY): Payer: Self-pay | Admitting: Emergency Medicine

## 2016-02-13 DIAGNOSIS — C50919 Malignant neoplasm of unspecified site of unspecified female breast: Secondary | ICD-10-CM

## 2016-02-13 DIAGNOSIS — R5382 Chronic fatigue, unspecified: Secondary | ICD-10-CM

## 2016-02-13 DIAGNOSIS — C229 Malignant neoplasm of liver, not specified as primary or secondary: Secondary | ICD-10-CM

## 2016-02-13 MED ORDER — METHYLPHENIDATE HCL 10 MG PO TABS: ORAL_TABLET | ORAL | 0 refills | Status: DC

## 2016-02-13 MED ORDER — ZOLPIDEM TARTRATE ER 12.5 MG PO TBCR: 12.5000 mg | EXTENDED_RELEASE_TABLET | Freq: Every evening | ORAL | 2 refills | Status: DC | PRN

## 2016-02-13 NOTE — Progress Notes (Signed)
Called to ask about the pharmacy calling her to send her the chemotherapy drugs.  They have not contacted her yet.  She needs refill on Ritalin and Ambien and I got those sent in for her.  She will come pick up her Rialin script.

## 2016-02-18 ENCOUNTER — Ambulatory Visit (HOSPITAL_COMMUNITY): Payer: 59

## 2016-02-18 ENCOUNTER — Encounter (HOSPITAL_COMMUNITY): Payer: Self-pay

## 2016-02-18 ENCOUNTER — Encounter (HOSPITAL_BASED_OUTPATIENT_CLINIC_OR_DEPARTMENT_OTHER): Payer: 59

## 2016-02-18 ENCOUNTER — Encounter (HOSPITAL_COMMUNITY): Payer: 59

## 2016-02-18 VITALS — BP 122/65 | HR 73 | Temp 98.0°F | Resp 18

## 2016-02-18 DIAGNOSIS — C50919 Malignant neoplasm of unspecified site of unspecified female breast: Secondary | ICD-10-CM

## 2016-02-18 DIAGNOSIS — C7931 Secondary malignant neoplasm of brain: Secondary | ICD-10-CM

## 2016-02-18 DIAGNOSIS — C7951 Secondary malignant neoplasm of bone: Secondary | ICD-10-CM

## 2016-02-18 DIAGNOSIS — C787 Secondary malignant neoplasm of liver and intrahepatic bile duct: Secondary | ICD-10-CM

## 2016-02-18 DIAGNOSIS — R5382 Chronic fatigue, unspecified: Secondary | ICD-10-CM

## 2016-02-18 DIAGNOSIS — C229 Malignant neoplasm of liver, not specified as primary or secondary: Secondary | ICD-10-CM | POA: Diagnosis present

## 2016-02-18 DIAGNOSIS — Z23 Encounter for immunization: Secondary | ICD-10-CM

## 2016-02-18 LAB — COMPREHENSIVE METABOLIC PANEL
ALBUMIN: 3.8 g/dL (ref 3.5–5.0)
ALK PHOS: 82 U/L (ref 38–126)
ALT: 16 U/L (ref 14–54)
ANION GAP: 7 (ref 5–15)
AST: 22 U/L (ref 15–41)
BUN: 12 mg/dL (ref 6–20)
CALCIUM: 9 mg/dL (ref 8.9–10.3)
CO2: 28 mmol/L (ref 22–32)
Chloride: 102 mmol/L (ref 101–111)
Creatinine, Ser: 0.84 mg/dL (ref 0.44–1.00)
GFR calc Af Amer: >60 mL/min (ref >60)
GFR calc non Af Amer: >60 mL/min (ref >60)
GLUCOSE: 160 mg/dL — AB (ref 65–99)
Potassium: 4 mmol/L (ref 3.5–5.1)
SODIUM: 137 mmol/L (ref 135–145)
Total Bilirubin: 0.5 mg/dL (ref 0.3–1.2)
Total Protein: 7.1 g/dL (ref 6.5–8.1)

## 2016-02-18 MED ORDER — ZOLEDRONIC ACID 4 MG/5ML IV CONC
4.0000 mg | Freq: Once | INTRAVENOUS | Status: AC
  Administered 2016-02-18: 4 mg via INTRAVENOUS
  Filled 2016-02-18: qty 5

## 2016-02-18 MED ORDER — HEPARIN SOD (PORK) LOCK FLUSH 100 UNIT/ML IV SOLN
500.0000 [IU] | Freq: Once | INTRAVENOUS | Status: AC
  Administered 2016-02-18: 500 [IU] via INTRAVENOUS

## 2016-02-18 MED ORDER — HEPARIN SOD (PORK) LOCK FLUSH 100 UNIT/ML IV SOLN
INTRAVENOUS | Status: AC
  Filled 2016-02-18: qty 5

## 2016-02-18 MED ORDER — SODIUM CHLORIDE 0.9 % IV SOLN
INTRAVENOUS | Status: DC
  Administered 2016-02-18: 14:00:00 via INTRAVENOUS

## 2016-02-18 NOTE — Patient Instructions (Addendum)
[place] at [facility] Discharge Instructions  RECOMMENDATIONS MADE BY THE CONSULTANT AND ANY TEST RESULTS WILL BE SENT TO YOUR REFERRING PHYSICIAN.  Zometa infusion today. Return as scheduled for Zometa infusions. Office visit as scheduled.   Thank you for choosing [place] at [facility] to provide your oncology and hematology care.  To afford each patient quality time with our provider, please arrive at least 15 minutes before your scheduled appointment time.   Beginning January 23rd 2017 lab work for the [facility] will be done in the  Main lab at [facility] on 1st floor. If you have a lab appointment with the [place] please come in thru the  Main Entrance and check in at the main information desk  You need to re-schedule your appointment should you arrive 10 or more minutes late.  We strive to give you quality time with our providers, and arriving late affects you and other patients whose appointments are after yours.  Also, if you no show three or more times for appointments you may be dismissed from the clinic at the providers discretion.     Again, thank you for choosing [facility].  Our hope is that these requests will decrease the amount of time that you wait before being seen by our physicians.       _____________________________________________________________  Should you have questions after your visit to [facility], please contact our office at [phone] between the hours of 8:30 a.m. and 4:30 p.m.  Voicemails left after 4:30 p.m. will not be returned until the following business day.  For prescription refill requests, have your pharmacy contact our office.         Resources For Cancer Patients and their Caregivers ? American Cancer Society: Can assist with transportation, wigs, general needs, runs Look Good Feel Better.        520 284 7393 ? Cancer Care: Provides financial  assistance, online support groups, medication/co-pay assistance.  1-800-813-HOPE (484)626-0831) ? [place] Assists [place] cancer patients and their families through emotional , educational and financial support.  (415)368-8407 ? [place] DSS Where to apply for food stamps, Medicaid and utility assistance. 423-421-4722 ? RCATS: Transportation to medical appointments. (609) 527-3389 ? Social Security Administration: May apply for disability if have a Stage IV cancer. (678)393-9046 (613)427-0989 ? [employer], Disability and Transit Services: Assists with nutrition, care and transit needs. [place] Support Programs: @10RELATIVEDAYS @ > Cancer Support Group  2nd Tuesday of the month 1pm-2pm, Journey Room  > Creative Journey  3rd Tuesday of the month 1130am-1pm, Journey Room  > Look Good Feel Better  1st Wednesday of the month 10am-12 noon, Journey Room (Call [place] to register [phone])

## 2016-02-19 NOTE — Progress Notes (Signed)
02/18/16, 1450:  Tolerated infusion w/o adverse reaction.  Alert, in no distress.  Discharged ambulatory.

## 2016-02-21 ENCOUNTER — Other Ambulatory Visit (HOSPITAL_COMMUNITY): Payer: Self-pay | Admitting: Oncology

## 2016-02-21 ENCOUNTER — Telehealth (HOSPITAL_COMMUNITY): Payer: Self-pay | Admitting: Emergency Medicine

## 2016-02-21 DIAGNOSIS — F329 Major depressive disorder, single episode, unspecified: Secondary | ICD-10-CM

## 2016-02-21 DIAGNOSIS — C229 Malignant neoplasm of liver, not specified as primary or secondary: Secondary | ICD-10-CM

## 2016-02-21 DIAGNOSIS — F32A Depression, unspecified: Secondary | ICD-10-CM

## 2016-02-21 DIAGNOSIS — C50919 Malignant neoplasm of unspecified site of unspecified female breast: Secondary | ICD-10-CM

## 2016-02-21 DIAGNOSIS — R5382 Chronic fatigue, unspecified: Secondary | ICD-10-CM

## 2016-02-21 NOTE — Telephone Encounter (Signed)
Pt is taking medication as prescribed.  She starting taking both medications tykerb and Xeloda on Wednesday morning.  She felt ok on Wednesday.  Started feeling a little worse on Thursday.  Feels terrible today.  Stomach is gurgling but she has not had any diarrhea yet.  Pt does know to take imodium if she gets diarrhea. She has been very tired and has not gotten out of bed.  She has not had much of an appetite.  Spoke with Kirby Crigler PA and he recommended holding the medication this weekend and call us on Monday morning with an update of how she feels and we will let her know how to resume taking her medication.  Pt verbalized understanding.

## 2016-02-24 ENCOUNTER — Telehealth (HOSPITAL_COMMUNITY): Payer: Self-pay | Admitting: Emergency Medicine

## 2016-02-24 NOTE — Telephone Encounter (Signed)
Pt tried to take her first dose of Xeloda and Tykerb.  Thayer Jew called and said that an hour and half after pt took her medication she vomited it up.  Told pt to take her nausea medication as needed.  To call me with an update later on how she feels.

## 2016-02-24 NOTE — Telephone Encounter (Signed)
Pt called and stated that she was feeling much better.  Spoke with Kirby Crigler PA and she is to start Xeloda 2000 mg 7 days on and 7 days off.  Tykerb 1250 mg like the original dose.  Pt is going to re-start the medication today and verbalizes understanding of new dose and how to take the medication.

## 2016-02-25 ENCOUNTER — Telehealth (HOSPITAL_COMMUNITY): Payer: Self-pay | Admitting: Emergency Medicine

## 2016-02-25 NOTE — Telephone Encounter (Signed)
Pt taking chemotherapy medications (xeloda and tykerb) as prescribed.  Called to check to see how pt was feeling today.  Pt has not thrown up anymore.  Pt still feels nauseous but is taking her nausea medication.  Pt knows to call if she has any problems.

## 2016-02-27 ENCOUNTER — Encounter (HOSPITAL_COMMUNITY): Payer: Self-pay | Admitting: Oncology

## 2016-02-27 ENCOUNTER — Encounter (HOSPITAL_BASED_OUTPATIENT_CLINIC_OR_DEPARTMENT_OTHER): Payer: 59 | Admitting: Oncology

## 2016-02-27 ENCOUNTER — Encounter (HOSPITAL_COMMUNITY): Payer: 59

## 2016-02-27 VITALS — BP 128/86 | HR 106 | Temp 98.2°F | Resp 18 | Wt 244.6 lb

## 2016-02-27 DIAGNOSIS — C50919 Malignant neoplasm of unspecified site of unspecified female breast: Secondary | ICD-10-CM | POA: Diagnosis not present

## 2016-02-27 DIAGNOSIS — R110 Nausea: Secondary | ICD-10-CM

## 2016-02-27 DIAGNOSIS — C7951 Secondary malignant neoplasm of bone: Secondary | ICD-10-CM

## 2016-02-27 DIAGNOSIS — C787 Secondary malignant neoplasm of liver and intrahepatic bile duct: Secondary | ICD-10-CM | POA: Diagnosis not present

## 2016-02-27 DIAGNOSIS — C229 Malignant neoplasm of liver, not specified as primary or secondary: Secondary | ICD-10-CM | POA: Diagnosis not present

## 2016-02-27 DIAGNOSIS — R630 Anorexia: Secondary | ICD-10-CM

## 2016-02-27 DIAGNOSIS — C7931 Secondary malignant neoplasm of brain: Secondary | ICD-10-CM | POA: Diagnosis not present

## 2016-02-27 DIAGNOSIS — G4700 Insomnia, unspecified: Secondary | ICD-10-CM

## 2016-02-27 LAB — CBC WITH DIFFERENTIAL/PLATELET
BASOS ABS: 0 10*3/uL (ref 0.0–0.1)
BASOS PCT: 0 %
Eosinophils Absolute: 0 10*3/uL (ref 0.0–0.7)
Eosinophils Relative: 0 %
HEMATOCRIT: 42 % (ref 36.0–46.0)
HEMOGLOBIN: 13.5 g/dL (ref 12.0–15.0)
Lymphocytes Relative: 17 %
Lymphs Abs: 1.2 10*3/uL (ref 0.7–4.0)
MCH: 29 pg (ref 26.0–34.0)
MCHC: 32.1 g/dL (ref 30.0–36.0)
MCV: 90.1 fL (ref 78.0–100.0)
MONOS PCT: 7 %
Monocytes Absolute: 0.5 10*3/uL (ref 0.1–1.0)
NEUTROS ABS: 5.5 10*3/uL (ref 1.7–7.7)
NEUTROS PCT: 76 %
Platelets: 245 10*3/uL (ref 150–400)
RBC: 4.66 MIL/uL (ref 3.87–5.11)
RDW: 14.8 % (ref 11.5–15.5)
WBC: 7.2 10*3/uL (ref 4.0–10.5)

## 2016-02-27 LAB — COMPREHENSIVE METABOLIC PANEL
ALK PHOS: 93 U/L (ref 38–126)
ALT: 22 U/L (ref 14–54)
ANION GAP: 7 (ref 5–15)
AST: 20 U/L (ref 15–41)
Albumin: 4 g/dL (ref 3.5–5.0)
BILIRUBIN TOTAL: 0.5 mg/dL (ref 0.3–1.2)
BUN: 16 mg/dL (ref 6–20)
CALCIUM: 9.3 mg/dL (ref 8.9–10.3)
CO2: 32 mmol/L (ref 22–32)
CREATININE: 0.87 mg/dL (ref 0.44–1.00)
Chloride: 97 mmol/L — ABNORMAL LOW (ref 101–111)
Glucose, Bld: 135 mg/dL — ABNORMAL HIGH (ref 65–99)
Potassium: 3.7 mmol/L (ref 3.5–5.1)
SODIUM: 136 mmol/L (ref 135–145)
TOTAL PROTEIN: 7.6 g/dL (ref 6.5–8.1)

## 2016-02-27 MED ORDER — ZOLPIDEM TARTRATE 10 MG PO TABS: 10.0000 mg | ORAL_TABLET | Freq: Every evening | ORAL | 0 refills | Status: DC | PRN

## 2016-02-27 MED ORDER — ZOLPIDEM TARTRATE 1.75 MG SL SUBL: 1.7500 | SUBLINGUAL_TABLET | Freq: Every evening | SUBLINGUAL | 1 refills | Status: DC | PRN

## 2016-02-27 NOTE — Progress Notes (Signed)
[name], MD [place] [address]  Malignant neoplasm of breast, stage 4, unspecified laterality ([facility]) - Plan: CBC with Differential, Comprehensive metabolic panel, Cancer antigen 27.29, Cancer antigen 15-3, CBC with Differential, Comprehensive metabolic panel, CBC with Differential, Comprehensive metabolic panel, Cancer antigen 27.29, Cancer antigen 15-3  Breast cancer, stage 4, unspecified laterality (HCC)  Insomnia, unspecified type - Plan: Zolpidem Tartrate (INTERMEZZO) 1.75 MG SUBL, zolpidem (AMBIEN) 10 MG tablet  CURRENT THERAPY: Xeloda 2000 mg 7 days on and 7 days off (dose reduced from 2300 mg) and Tykerb 1250 mg daily.  INTERVAL HISTORY: [name] 50 y.o. female returns for followup of Stage IV breast cancer to bones, ER+/HER2+    Breast cancer, stage 4 ([facility])   04/25/2014 Initial Diagnosis    Breast cancer, stage 4      04/25/2014 Imaging    CT abdomen/pelvis with widespread metastatic disease to the liver, multiple lytic lesions throughout spine and pelvis. No FX or epidural tumor identified      04/26/2014 Imaging    CT head unremarkable      04/26/2014 Imaging    CT chest with no lung mass or pulmonary nodules, no adenopathy. Lytic bone lesions, right 2nd rib      04/27/2014 Initial Biopsy    U/S guided liver biopsy, lesion in anterior and inferior left hepatic lobe biopsied      04/27/2014 Pathology Results    Metastatic adenocarcinoma, CK7, ER+, patchy positivity with PR. Possible primary includes breast, less likely gynecologic      05/15/2014 Mammogram    BI-RADS CATEGORY  2: Benign Finding(s)      05/16/2014 PET scan    1. Intensely hypermetabolic hepatic metastasis. 2. Widespread hypermetabolic skeletal lesions. 3. No primary adenocarcinoma identified by FDG PET imaging.      05/19/2014 Imaging    MUGA- Left ventricular ejection fracture greater than 70%.      05/21/2014 Breast MRI    No suspicious masses or  enhancement within the breasts. No axillary adenopathy.      05/22/2014 - 07/03/2014 Antibody Plan    Herceptin/Perjeta/Tamoxifen      06/12/2014 - 07/03/2014 Chemotherapy    Taxotere added secondary to persistent abdominal and back pain      06/17/2014 - 06/19/2014 Hospital Admission    Neutropenia, fever, diarrhea, nausea, vomiting      06/20/2014 - 07/10/2014 Radiation Therapy    Dr. Thea Silversmith 12 fractions to L3-S3 (30 Gy) and left scapula (20 Gy).       07/03/2014 Adverse Reaction    Perjeta- induced diarrhea.  Perjeta discontinued      07/16/2014 - 07/20/2014 Hospital Admission    Electrolyte abnormalities, and diarrhea.  Suspect Perjeta-induced diarrhea.  Negative GI work-up.      07/24/2014 - 08/19/2015 Chemotherapy    Herceptin/Tamoxifen/Xgeva      08/21/2014 Imaging    MUGA- Left ventricular ejection fraction equals 71%.      08/24/2014 PET scan    Dramatic reduction in metabolic activity of the widespread liver metastasis. Liver metastasis now have metabolic activity equal to background normal liver activity. Liver has a nodular contour. Marked reduction in metabolic activity of skeletal lesions..      10/05/2014 Progression    Widespread metastatic disease to the brain as described. Between 20 and 30 intracranial metastatic deposits are now seen. No midline shift or incipient herniation      10/09/2014 - 10/26/2014 Radiation Therapy  Whole Brain XRT      11/14/2014 Imaging    MUGA- LVEF 67%      02/13/2015 Imaging    MUGA- LVEF 59%      02/15/2015 Treatment Plan Change    Due to declining LVEF, will hold Herceptin per PI guidelines.      04/12/2015 -  Chemotherapy    Herceptin restarted      06/02/2015 - 06/08/2015 Hospital Admission    Pneumonia      07/05/2015 Progression     PET/CT concern for mild progression of skeletal metastasis with several lesions within the spine and 1 lesion in the Left iliac wing with mild to moderate metabolic activity new from  prior. Rising CA 27-29      07/16/2015 - 10/23/2015 Anti-estrogen oral therapy    Arimidex      07/16/2015 Imaging    MRI brain with satisfactory post treatment apperance of brain. interval resolved enhancing R caudate metastasis, minimal punctate residual enhancing metastatic disease at the inferior L cerebellum. No new metastatic disease or new intracranial abnormality      07/19/2015 Treatment Plan Change    Discontinue Tamoxifen, Zoladex plus Arimidex.       08/27/2015 Procedure    Laparoscopic bilateral salpingo-oophorectomy by Dr. Gaetano Net      10/17/2015 PET scan    Osseous metastatic disease appears slightly progressive based on a new right scapular lesion and increased uptake within lesions in the thoracic spine, left iliac wing and  proximal right femur.      10/17/2015 Progression    Slight progression on PET scan imaging.      10/18/2015 Imaging    REsolved enhancing metastatic disease to the brain status post WBXRT      10/23/2015 - 02/12/2016 Adjuvant Chemotherapy    Faslodex loading followed by maintenance dose.  (Herceptin continued)      11/04/2015 Imaging    MUGA- LEFT ventricular ejection fraction 51% slightly decreased in a 57% on the previous exam.      12/24/2015 Treatment Plan Change    Zometa every 28 days.  Xgeva discontinued.        01/01/2016 Imaging    MUGA- Left ventricular ejection fraction equals 57.9%. This is increased from 51.1% previously.      02/03/2016 PET scan    1. Mixed metabolic changes in the scattered hypermetabolic sclerotic osseous metastases throughout the axial and proximal appendicular skeleton as detailed above. 2. No new sites of hypermetabolic metastatic disease. Stable pseudo-cirrhotic appearance of the liver due to treated liver metastases with no hypermetabolic liver metastases.       02/03/2016 Progression    PET shows mixed osseous response.  Some lesions more hypermetabolic, others improved.      02/05/2016 Treatment  Plan Change    D/C Herceptin.  Faslodex as scheduled on 02/12/2016, then discontinued.  Continue Zometa.      02/05/2016 Treatment Plan Change    Prescriptions for Xeloda 7 days on and 7 days off and Tykerb printed and provided for authorization.      02/19/2016 -  Chemotherapy    Xeloda 2300 mg BID 7 days on and 7 days off and Tykerb       02/24/2016 Treatment Plan Change    Xeloda dose reduced by 10% to 2000 mg BID week on and week off.      She notes a decrease in appetite with mild nausea.  This is controlled with home antiemetics, but definitely worse since starting new treatment.  She reports that Ambien CR was not covered by insurance, she purchased the medication as cash payer.  She has 1 month supply, but this is not affordable moving forward.  She notes that she wakes at night and cannot fall back asleep.  Review of Systems  Constitutional: Negative.  Negative for chills, fever and weight loss.  HENT: Negative.   Eyes: Negative.   Respiratory: Negative.   Cardiovascular: Negative.   Gastrointestinal: Positive for nausea and vomiting. Negative for abdominal pain, constipation and diarrhea.  Genitourinary: Negative.   Musculoskeletal: Negative.   Skin: Negative.   Neurological: Negative.   Endo/Heme/Allergies: Negative.   Psychiatric/Behavioral: Negative.     Past Medical History:  Diagnosis Date  . Anticoagulated    xarelto  . Anxiety   . Breast cancer metastasized to multiple sites [facility])    liver, brain, and bone  . Breast cancer, stage 4 [facility]) oncologist-  dr Larene [place] ([facility])   dx 12/ 2015 -- breast cancer Stage 4,  ER/HER2 +,  w/  liver, brain and  bone mets/  chemotherapy and radiation therapy  . Chronic pain syndrome    secondary to cancer   . Depression   . Drug-induced cardiomyopathy ([facility])    per last MUGA (08-08-2015), ef 56.5/ per last echo 05-18-2014 ef 60-65%  . Family history of prostate cancer   . GERD (gastroesophageal  reflux disease)   . History of colon polyps    07-13-2013  benign  . History of DVT (deep vein thrombosis)    07-09-2014  upper right extremity-  RIJ and right subclavian--  resolved  . History of gastritis    erosive  . History of pneumonia    HCAP 06-07-2015--  resolved per cxr 07-04-2015  . History of radiation therapy    12 fractions to L3 - S3, 30Gy and left spacula 20Gy (06-20-2014 to 07-10-2014) //  whole brain rxt (10-09-2014 to 10-26-2014)  . History of small bowel obstruction    S/P RESECTION 2008  . Migraine   . PONV (postoperative nausea and vomiting)    pt states scope patch does well    Past Surgical History:  Procedure Laterality Date  . BREAST REDUCTION SURGERY  03/17/2011   Procedure: MAMMARY REDUCTION BILATERAL (BREAST);  Surgeon: Mary A Contogiannis;  Location: [facility];  Service: Plastics;  Laterality: Bilateral;  . CATARACT EXTRACTION W/ INTRAOCULAR LENS  IMPLANT, BILATERAL  2008  . CERVICAL FUSION  2003   C5 -- C6  . COLONOSCOPY N/A 07/13/2013   Procedure: COLONOSCOPY;  Surgeon: Rogene Houston, MD;  [facility];  Service: Endoscopy;  Laterality: N/A;  930  . COLONOSCOPY N/A 11/26/2014   Procedure: COLONOSCOPY;  Surgeon: Rogene Houston, MD;  [facility];  Service: Endoscopy;  Laterality: N/A;  730  . D & C HYSTEROSCOPY/  RESECTION ENDOMETRIAL MASS/  [place] ENDOMETRIAL ABLATION  04-11-2010  . DX LAPAROSCOPY W/ PARTIAL SMALL BOWEL RESECTION AND APPENDECTOMY  04-13-2007  . ESOPHAGOGASTRODUODENOSCOPY N/A 05/25/2014   Procedure: ESOPHAGOGASTRODUODENOSCOPY (EGD);  Surgeon: Rogene Houston, MD;  [facility];  Service: Endoscopy;  Laterality: N/A;  155  . KNEE ARTHROSCOPY Right 2005  . LAPAROSCOPIC ASSISTED VAGINAL HYSTERECTOMY  10-13-2010   w/ Bx Left Fallopian tube and Aspiration Right Ovarian Cyst  . LAPAROSCOPIC CHOLECYSTECTOMY  11-17-2002  . LAPAROSCOPIC SALPINGO OOPHERECTOMY Bilateral 08/26/2015   Procedure:  LAPAROSCOPIC SALPINGO OOPHORECTOMY, bilateral;  Surgeon: Everlene Farrier, MD;  Location: [facility];  Service: Gynecology;  Laterality: Bilateral;  . PORTACATH PLACEMENT  05-17-2014  . SHOULDER ARTHROSCOPY WITH ROTATOR CUFF REPAIR Right 2002  . TRANSTHORACIC ECHOCARDIOGRAM  05-18-2014   ef 60-65%//   last MUGA  (08-08-2015)  ef 56.6%      Family History  Problem Relation Age of Onset  . Diabetes Father   . Heart attack Maternal Grandmother 30    multiple over lifetime.  . Cancer Maternal Grandmother 58    NOS  . Prostate cancer Maternal Grandfather     dx in his 46s  . Lung cancer Paternal Grandfather     dx <50  . Lymphoma Maternal Aunt     dx in her 53s  . Melanoma Cousin 72    maternal first cousin  . Brain cancer Cousin     paternal first cousin dx under 68  . Prostate cancer Other     MGF's father  . Colon cancer Other     MGM's mother    Social History   Social History  . Marital status: Married    Spouse name: N/A  . Number of children: N/A  . Years of education: N/A   Social History Main Topics  . Smoking status: Former Smoker    Types: Cigarettes    Quit date: 08/20/1994  . Smokeless tobacco: Never Used  . Alcohol use Yes     Comment: Occasionally  . Drug use: No  . Sexual activity: Not Asked   Other Topics Concern  . None   Social History Narrative  . None     PHYSICAL EXAMINATION  ECOG PERFORMANCE STATUS: 1 - Symptomatic but completely ambulatory  Vitals:   02/27/16 0800  BP: 128/86  Pulse: (!) 106  Resp: 18  Temp: 98.2 F (36.8 C)    GENERAL:alert, no distress, well nourished, well developed, comfortable, cooperative, obese, smiling and accompanied by husband SKIN: skin color, texture, turgor are normal, no rashes or significant lesions HEAD: Normocephalic, No masses, lesions, tenderness or abnormalities EYES: normal, EOMI, Conjunctiva are pink and non-injected EARS: External ears normal OROPHARYNX:lips, buccal mucosa,  and tongue normal and mucous membranes are moist  NECK: supple, trachea midline LYMPH:  no palpable lymphadenopathy BREAST:not examined LUNGS: clear to auscultation  HEART: regular rate & rhythm ABDOMEN:abdomen soft and normal bowel sounds BACK: Back symmetric, no curvature. EXTREMITIES:less then 2 second capillary refill, no joint deformities, effusion, or inflammation, no skin discoloration, no cyanosis  NEURO: alert & oriented x 3 with fluent speech, no focal motor/sensory deficits, gait normal   LABORATORY DATA: CBC    Component Value Date/Time   WBC 7.2 02/27/2016 0940   RBC 4.66 02/27/2016 0940   HGB 13.5 02/27/2016 0940   HCT 42.0 02/27/2016 0940   PLT 245 02/27/2016 0940   MCV 90.1 02/27/2016 0940   MCH 29.0 02/27/2016 0940   MCHC 32.1 02/27/2016 0940   RDW 14.8 02/27/2016 0940   LYMPHSABS 1.2 02/27/2016 0940   MONOABS 0.5 02/27/2016 0940   EOSABS 0.0 02/27/2016 0940   BASOSABS 0.0 02/27/2016 0940      Chemistry      Component Value Date/Time   NA 136 02/27/2016 0940   K 3.7 02/27/2016 0940   CL 97 (L) 02/27/2016 0940   CO2 32 02/27/2016 0940   BUN 16 02/27/2016 0940   CREATININE 0.87 02/27/2016 0940      Component Value Date/Time   CALCIUM 9.3 02/27/2016 0940   ALKPHOS 93 02/27/2016 0940   AST 20 02/27/2016  0940   ALT 22 02/27/2016 0940   BILITOT 0.5 02/27/2016 0940      Lab Results  Component Value Date   LABCA2 41.3 (H) 01/15/2016     PENDING LABS:   RADIOGRAPHIC STUDIES:  Nm Pet Image Restag (ps) Skull Base To Thigh  Result Date: 02/03/2016 CLINICAL DATA:  Subsequent treatment strategy for stage IV breast cancer with bone metastases, presenting for restaging. EXAM: NUCLEAR MEDICINE PET SKULL BASE TO THIGH TECHNIQUE: 12.3 mCi F-18 FDG was injected intravenously. Full-ring PET imaging was performed from the skull base to thigh after the radiotracer. CT data was obtained and used for attenuation correction and anatomic localization. FASTING  BLOOD GLUCOSE:  Value: 100 seconds mg/dl COMPARISON:  10/17/2015 PET-CT. FINDINGS: NECK No hypermetabolic lymph nodes in the neck. Symmetric hypermetabolism in the glottis without CT correlate, probably physiologic. CHEST No hypermetabolic axillary, mediastinal or hilar nodes. Right internal jugular MediPort terminates in the lower third of the superior vena cava. No acute consolidative airspace disease or significant pulmonary nodules. ABDOMEN/PELVIS Stable pseudocirrhotic appearance of the liver due to the prior treated liver metastases. No hypermetabolic foci in the liver. Low-attenuation 1.5 cm focus in the posterior right liver lobe is stable and non hypermetabolic. Cholecystectomy. Stable postsurgical changes in the distal small bowel. Hysterectomy. No abnormal hypermetabolic activity within the pancreas, adrenal glands, or spleen. No hypermetabolic lymph nodes in the abdomen or pelvis. SKELETON There are mixed changes in the hypermetabolic sclerotic osseous metastases throughout the axial and proximal appendicular skeleton. The following sclerotic osseous metastases demonstrate increased metabolism: - left central sacral metastasis with max SUV 6.4, previously 3.5 - left T10 posterior element metastasis with max SUV 9.9, previously 6.3 - right sacral metastasis with max SUV 12.4, previously 6.7 The following sclerotic osseous metastases demonstrate decreased metabolism: - right femoral head metastasis with max SUV 11.1, previously 11.5 - left iliac wing metastasis with max SUV 12.6, previously 13.4 - right posterior T6 metastasis with max SUV 11.8, previously 15.8 - medial inferior left clavicular metastasis with max SUV 5.3, previously 7.6 No new hypermetabolic osseous metastases. IMPRESSION: 1. Mixed metabolic changes in the scattered hypermetabolic sclerotic osseous metastases throughout the axial and proximal appendicular skeleton as detailed above. 2. No new sites of hypermetabolic metastatic disease.  Stable pseudo-cirrhotic appearance of the liver due to treated liver metastases with no hypermetabolic liver metastases. Electronically Signed   By: Ilona Sorrel M.D.   On: 02/03/2016 13:34     PATHOLOGY:    ASSESSMENT AND PLAN:  Breast cancer, stage 4 (HCC) Stage IV adenocarcinoma of the breast, ER+, HER 2 + disease.  S/P BSO by Dr. Gaetano Net on 08/27/2015.  Oncology history is updated.  Current therapy is Xeloda/Tykerb/Zometa.  She was started on Xeloda 2300 mg BID 7 days on and 7 days off.  After about 2-3 days, she developed nausea and vomiting with severe fatigue.  Therefore, she was asked to hold Xeloda for 2-3 days.  This resolved and she restarted Xeloda at a reduced dose of 2000 mg BID 7 days on and 7 days off.  She restarted this dose on 02/24/2016.  She will take Xeloda today and tomorrow and then start her Xeloda break.  She will restart on 03/09/2016.  Her insurance did not cover Ambien CR.  As a result, I refilled her Ambien and provided an Rx for Intermezzo.  She will let us know if this is not cost effective.  She did pay for Ambien CR out of pocket for nearly $  100.  She only has 1 month supply of this medication and this is not affordable moving forward.  She notes a decrease in appetite, but no documented weight loss.  She does have some mild nausea that is currently controlled with home antiemetics.  She is going to the beach for 48-72 hours next week.  Labs today: CBC diff, CMET, CA 27.29, and CA 15-3.  I personally reviewed and went over laboratory results with the patient.  The results are noted within this dictation.  Next MUGA is due in mid-November 2017.  This is scheduled for 03/23/2016  Labs in 2 weeks: CBC diff, CMET.  Zometa is due in 2 weeks.  Return in 2 weeks for follow-up.   ORDERS PLACED FOR THIS ENCOUNTER: Orders Placed This Encounter  Procedures  . CBC with Differential  . Comprehensive metabolic panel  . Cancer antigen 27.29  . Cancer antigen  15-3  . CBC with Differential  . Comprehensive metabolic panel    MEDICATIONS PRESCRIBED THIS ENCOUNTER: Meds ordered this encounter  Medications  . gabapentin (NEURONTIN) 300 MG capsule    Sig: Take 300 mg by mouth at bedtime.  . Zolpidem Tartrate (INTERMEZZO) 1.75 MG SUBL    Sig: Place 1.75 tablets under the tongue at bedtime as needed.    Dispense:  30 tablet    Refill:  1    Order Specific Question:   Supervising Provider    Answer:   Patrici Ranks [id number]  . zolpidem (AMBIEN) 10 MG tablet    Sig: Take 1 tablet (10 mg total) by mouth at bedtime as needed for sleep.    Dispense:  30 tablet    Refill:  0    Order Specific Question:   Supervising Provider    Answer:   Patrici Ranks [id number]    THERAPY PLAN:  Continue Xeloda/Tykerb/Zometa  All questions were answered. The patient knows to call the clinic with any problems, questions or concerns. We can certainly see the patient much sooner if necessary.  Patient and plan discussed with Dr. Ancil Linsey and she is in agreement with the aforementioned.   This note is electronically signed by: Doy Mince 02/27/2016 5:41 PM

## 2016-02-27 NOTE — Assessment & Plan Note (Addendum)
Stage IV adenocarcinoma of the breast, ER+, HER 2 + disease.  S/P BSO by Dr. Gaetano Net on 08/27/2015.  Oncology history is updated.  Current therapy is Xeloda/Tykerb/Zometa.  She was started on Xeloda 2300 mg BID 7 days on and 7 days off.  After about 2-3 days, she developed nausea and vomiting with severe fatigue.  Therefore, she was asked to hold Xeloda for 2-3 days.  This resolved and she restarted Xeloda at a reduced dose of 2000 mg BID 7 days on and 7 days off.  She restarted this dose on 02/24/2016.  She will take Xeloda today and tomorrow and then start her Xeloda break.  She will restart on 03/09/2016.  Her insurance did not cover Ambien CR.  As a result, I refilled her Ambien and provided an Rx for Intermezzo.  She will let us know if this is not cost effective.  She did pay for Ambien CR out of pocket for nearly $100.  She only has 1 month supply of this medication and this is not affordable moving forward.  She notes a decrease in appetite, but no documented weight loss.  She does have some mild nausea that is currently controlled with home antiemetics.  She is going to the beach for 48-72 hours next week.  Labs today: CBC diff, CMET, CA 27.29, and CA 15-3.  I personally reviewed and went over laboratory results with the patient.  The results are noted within this dictation.  Next MUGA is due in mid-November 2017.  This is scheduled for 03/23/2016  Labs in 2 weeks: CBC diff, CMET.  Zometa is due in 2 weeks.  Return in 2 weeks for follow-up.

## 2016-02-27 NOTE — Patient Instructions (Signed)
[name] at [facility] Discharge Instructions  RECOMMENDATIONS MADE BY THE CONSULTANT AND ANY TEST RESULTS WILL BE SENT TO YOUR REFERRING PHYSICIAN.  You were seen by Gershon Mussel today. Prepscription for Intermezzo Xeloda today and tomorrow Then restart 10/30 Return in 2 weeks for Zometo, labs and follow up  Thank you for choosing [place] at [facility] to provide your oncology and hematology care.  To afford each patient quality time with our provider, please arrive at least 15 minutes before your scheduled appointment time.   Beginning January 23rd 2017 lab work for the [facility] will be done in the  Main lab at [facility] on 1st floor. If you have a lab appointment with the [facility] please come in thru the  Main Entrance and check in at the main information desk  You need to re-schedule your appointment should you arrive 10 or more minutes late.  We strive to give you quality time with our providers, and arriving late affects you and other patients whose appointments are after yours.  Also, if you no show three or more times for appointments you may be dismissed from the clinic at the providers discretion.     Again, thank you for choosing [facility].  Our hope is that these requests will decrease the amount of time that you wait before being seen by our physicians.       _____________________________________________________________  Should you have questions after your visit to [facility], please contact our office at [phone] between the hours of 8:30 a.m. and 4:30 p.m.  Voicemails left after 4:30 p.m. will not be returned until the following business day.  For prescription refill requests, have your pharmacy contact our office.         Resources For Cancer Patients and their Caregivers ? American Cancer Society: Can assist with transportation, wigs, general needs, runs Look Good Feel Better.         (334)269-8485 ? Cancer Care: Provides financial assistance, online support groups, medication/co-pay assistance.  1-800-813-HOPE (314)777-2110) ? [place] Assists [place] cancer patients and their families through emotional , educational and financial support.  714-138-0272 ? [place] DSS Where to apply for food stamps, Medicaid and utility assistance. 225-362-7166 ? [facility]: Transportation to medical appointments. 4238192568 ? Social Security Administration: May apply for disability if have a Stage IV cancer. 309-608-4900 206-672-8120 ? [employer], Disability and Transit Services: Assists with nutrition, care and transit needs. [place] Support Programs: @10RELATIVEDAYS @ > Cancer Support Group  2nd Tuesday of the month 1pm-2pm, Journey Room  > Creative Journey  3rd Tuesday of the month 1130am-1pm, Journey Room  > Look Good Feel Better  1st Wednesday of the month 10am-12 noon, Journey Room (Call [place] to register [phone])

## 2016-02-28 ENCOUNTER — Telehealth (HOSPITAL_COMMUNITY): Payer: Self-pay | Admitting: Emergency Medicine

## 2016-02-28 LAB — CANCER ANTIGEN 15-3: CA 15-3: 41.6 U/mL — ABNORMAL HIGH (ref 0.0–25.0)

## 2016-02-28 LAB — CANCER ANTIGEN 27.29: CA 27.29: 59.1 U/mL — ABNORMAL HIGH (ref 0.0–38.6)

## 2016-02-28 NOTE — Telephone Encounter (Signed)
Pt called said that the insurance company wouldn't pay for the intermezzo or the ambien CR.  I let Kirby Crigler PA know.  She just wants to take regular ambien and she will add melatonin with it.

## 2016-03-03 ENCOUNTER — Encounter (HOSPITAL_COMMUNITY): Payer: Self-pay | Admitting: Oncology

## 2016-03-09 ENCOUNTER — Other Ambulatory Visit (HOSPITAL_COMMUNITY): Payer: Self-pay | Admitting: Oncology

## 2016-03-09 ENCOUNTER — Encounter (HOSPITAL_COMMUNITY): Payer: Self-pay | Admitting: Oncology

## 2016-03-09 ENCOUNTER — Other Ambulatory Visit (HOSPITAL_COMMUNITY): Payer: Self-pay | Admitting: *Deleted

## 2016-03-09 DIAGNOSIS — C50919 Malignant neoplasm of unspecified site of unspecified female breast: Secondary | ICD-10-CM

## 2016-03-09 DIAGNOSIS — C7951 Secondary malignant neoplasm of bone: Secondary | ICD-10-CM

## 2016-03-09 MED ORDER — MORPHINE SULFATE ER 30 MG PO TBCR: 30.0000 mg | EXTENDED_RELEASE_TABLET | Freq: Two times a day (BID) | ORAL | 0 refills | Status: DC

## 2016-03-09 MED ORDER — OXYCODONE HCL 5 MG PO TABS: 5.0000 mg | ORAL_TABLET | ORAL | 0 refills | Status: DC | PRN

## 2016-03-18 ENCOUNTER — Encounter (HOSPITAL_COMMUNITY): Payer: 59 | Attending: Oncology | Admitting: Hematology & Oncology

## 2016-03-18 ENCOUNTER — Encounter (HOSPITAL_BASED_OUTPATIENT_CLINIC_OR_DEPARTMENT_OTHER): Payer: 59

## 2016-03-18 ENCOUNTER — Encounter (HOSPITAL_COMMUNITY): Payer: Self-pay | Admitting: Hematology & Oncology

## 2016-03-18 VITALS — BP 130/89 | HR 88 | Temp 97.7°F | Resp 18 | Wt 248.8 lb

## 2016-03-18 DIAGNOSIS — C7931 Secondary malignant neoplasm of brain: Secondary | ICD-10-CM

## 2016-03-18 DIAGNOSIS — Z79899 Other long term (current) drug therapy: Secondary | ICD-10-CM

## 2016-03-18 DIAGNOSIS — C787 Secondary malignant neoplasm of liver and intrahepatic bile duct: Secondary | ICD-10-CM

## 2016-03-18 DIAGNOSIS — K1379 Other lesions of oral mucosa: Secondary | ICD-10-CM

## 2016-03-18 DIAGNOSIS — C50919 Malignant neoplasm of unspecified site of unspecified female breast: Secondary | ICD-10-CM

## 2016-03-18 DIAGNOSIS — C7951 Secondary malignant neoplasm of bone: Secondary | ICD-10-CM | POA: Diagnosis not present

## 2016-03-18 DIAGNOSIS — F419 Anxiety disorder, unspecified: Secondary | ICD-10-CM

## 2016-03-18 DIAGNOSIS — G4700 Insomnia, unspecified: Secondary | ICD-10-CM

## 2016-03-18 DIAGNOSIS — G629 Polyneuropathy, unspecified: Secondary | ICD-10-CM

## 2016-03-18 DIAGNOSIS — R530 Neoplastic (malignant) related fatigue: Secondary | ICD-10-CM

## 2016-03-18 DIAGNOSIS — I82B11 Acute embolism and thrombosis of right subclavian vein: Secondary | ICD-10-CM

## 2016-03-18 DIAGNOSIS — R110 Nausea: Secondary | ICD-10-CM

## 2016-03-18 DIAGNOSIS — C229 Malignant neoplasm of liver, not specified as primary or secondary: Secondary | ICD-10-CM | POA: Diagnosis not present

## 2016-03-18 DIAGNOSIS — I82C11 Acute embolism and thrombosis of right internal jugular vein: Secondary | ICD-10-CM

## 2016-03-18 LAB — COMPREHENSIVE METABOLIC PANEL
ALBUMIN: 3.7 g/dL (ref 3.5–5.0)
ALK PHOS: 92 U/L (ref 38–126)
ALT: 17 U/L (ref 14–54)
ANION GAP: 6 (ref 5–15)
AST: 21 U/L (ref 15–41)
BILIRUBIN TOTAL: 0.3 mg/dL (ref 0.3–1.2)
BUN: 11 mg/dL (ref 6–20)
CALCIUM: 8.9 mg/dL (ref 8.9–10.3)
CO2: 28 mmol/L (ref 22–32)
Chloride: 100 mmol/L — ABNORMAL LOW (ref 101–111)
Creatinine, Ser: 0.77 mg/dL (ref 0.44–1.00)
GFR calc non Af Amer: >60 mL/min (ref >60)
GLUCOSE: 109 mg/dL — AB (ref 65–99)
POTASSIUM: 3.8 mmol/L (ref 3.5–5.1)
SODIUM: 134 mmol/L — AB (ref 135–145)
TOTAL PROTEIN: 6.9 g/dL (ref 6.5–8.1)

## 2016-03-18 LAB — CBC WITH DIFFERENTIAL/PLATELET
BASOS PCT: 0 %
Basophils Absolute: 0 10*3/uL (ref 0.0–0.1)
EOS ABS: 0 10*3/uL (ref 0.0–0.7)
Eosinophils Relative: 0 %
HEMATOCRIT: 37.6 % (ref 36.0–46.0)
Hemoglobin: 12.1 g/dL (ref 12.0–15.0)
LYMPHS ABS: 0.7 10*3/uL (ref 0.7–4.0)
Lymphocytes Relative: 11 %
MCH: 29.2 pg (ref 26.0–34.0)
MCHC: 32.2 g/dL (ref 30.0–36.0)
MCV: 90.6 fL (ref 78.0–100.0)
MONO ABS: 0.5 10*3/uL (ref 0.1–1.0)
MONOS PCT: 8 %
Neutro Abs: 5.2 10*3/uL (ref 1.7–7.7)
Neutrophils Relative %: 81 %
Platelets: 208 10*3/uL (ref 150–400)
RBC: 4.15 MIL/uL (ref 3.87–5.11)
RDW: 16.2 % — AB (ref 11.5–15.5)
WBC: 6.4 10*3/uL (ref 4.0–10.5)

## 2016-03-18 MED ORDER — SODIUM CHLORIDE 0.9% FLUSH
10.0000 mL | INTRAVENOUS | Status: DC | PRN
  Administered 2016-03-18: 10 mL via INTRAVENOUS
  Filled 2016-03-18: qty 10

## 2016-03-18 MED ORDER — ZOLEDRONIC ACID 4 MG/5ML IV CONC: 4.0000 mg | Freq: Once | INTRAVENOUS | Status: DC

## 2016-03-18 MED ORDER — HEPARIN SOD (PORK) LOCK FLUSH 100 UNIT/ML IV SOLN
500.0000 [IU] | Freq: Once | INTRAVENOUS | Status: AC
  Administered 2016-03-18: 500 [IU] via INTRAVENOUS

## 2016-03-18 MED ORDER — MAGIC MOUTHWASH: ORAL | 3 refills | Status: DC

## 2016-03-18 MED ORDER — ZOLEDRONIC ACID 4 MG/5ML IV CONC
4.0000 mg | Freq: Once | INTRAVENOUS | Status: AC
  Administered 2016-03-18: 4 mg via INTRAVENOUS
  Filled 2016-03-18: qty 5

## 2016-03-18 MED ORDER — SODIUM CHLORIDE 0.9 % IV SOLN
INTRAVENOUS | Status: DC
  Administered 2016-03-18: 13:00:00 via INTRAVENOUS

## 2016-03-18 MED ORDER — HEPARIN SOD (PORK) LOCK FLUSH 100 UNIT/ML IV SOLN
INTRAVENOUS | Status: AC
  Filled 2016-03-18: qty 5

## 2016-03-18 MED ORDER — ZOLEDRONIC ACID 4 MG/100ML IV SOLN: 4.0000 mg | Freq: Once | INTRAVENOUS | Status: DC

## 2016-03-18 NOTE — Patient Instructions (Addendum)
[place] at [facility] Discharge Instructions  RECOMMENDATIONS MADE BY THE CONSULTANT AND ANY TEST RESULTS WILL BE SENT TO YOUR REFERRING PHYSICIAN.  You saw Dr.Penland today.  See Amy at checkout for appointments.  Return to clinic in 2 weeks with labs  Xeloda 2000mg  in the morning and Xeloda 1500mg  in the evening  Magic Mouthwash called in  Thank you for choosing [place] at [facility] to provide your oncology and hematology care.  To afford each patient quality time with our provider, please arrive at least 15 minutes before your scheduled appointment time.   Beginning January 23rd 2017 lab work for the [facility] will be done in the  Main lab at [facility] on 1st floor. If you have a lab appointment with the [facility] please come in thru the  Main Entrance and check in at the main information desk  You need to re-schedule your appointment should you arrive 10 or more minutes late.  We strive to give you quality time with our providers, and arriving late affects you and other patients whose appointments are after yours.  Also, if you no show three or more times for appointments you may be dismissed from the clinic at the providers discretion.     Again, thank you for choosing [facility].  Our hope is that these requests will decrease the amount of time that you wait before being seen by our physicians.       _____________________________________________________________  Should you have questions after your visit to [facility], please contact our office at [phone] between the hours of 8:30 a.m. and 4:30 p.m.  Voicemails left after 4:30 p.m. will not be returned until the following business day.  For prescription refill requests, have your pharmacy contact our office.         Resources For Cancer Patients and their Caregivers ? American [employer] Society: Can assist with transportation,  wigs, general needs, runs Look Good Feel Better.        (479) 449-7617 ? Cancer Care: Provides financial assistance, online support groups, medication/co-pay assistance.  1-800-813-HOPE 249-318-3540) ? [place] Assists [place] cancer patients and their families through emotional , educational and financial support.  402-016-0454 ? [place] DSS Where to apply for food stamps, Medicaid and utility assistance. (802)359-1522 ? [facility]: Transportation to medical appointments. 432 586 3122 ? Social Security Administration: May apply for disability if have a Stage IV cancer. (832)623-1396 8672674628 ? [employer], Disability and Transit Services: Assists with nutrition, care and transit needs. [place] Support Programs: @10RELATIVEDAYS @ > Cancer Support Group  2nd Tuesday of the month 1pm-2pm, Journey Room  > Creative Journey  3rd Tuesday of the month 1130am-1pm, Journey Room  > Look Good Feel Better  1st Wednesday of the month 10am-12 noon, Journey Room (Call [place] to register [phone])

## 2016-03-18 NOTE — Progress Notes (Signed)
[facility] [place], MD [place] [address]    Breast cancer, stage 4 [facility])   04/25/2014 Initial Diagnosis    Breast cancer, stage 4      04/25/2014 Imaging    CT abdomen/pelvis with widespread metastatic disease to the liver, multiple lytic lesions throughout spine and pelvis. No FX or epidural tumor identified      04/26/2014 Imaging    CT head unremarkable      04/26/2014 Imaging    CT chest with no lung mass or pulmonary nodules, no adenopathy. Lytic bone lesions, right 2nd rib      04/27/2014 Initial Biopsy    U/S guided liver biopsy, lesion in anterior and inferior left hepatic lobe biopsied      04/27/2014 Pathology Results    Metastatic adenocarcinoma, CK7, ER+, patchy positivity with PR. Possible primary includes breast, less likely gynecologic      05/15/2014 Mammogram    BI-RADS CATEGORY  2: Benign Finding(s)      05/16/2014 PET scan    1. Intensely hypermetabolic hepatic metastasis. 2. Widespread hypermetabolic skeletal lesions. 3. No primary adenocarcinoma identified by FDG PET imaging.      05/19/2014 Imaging    MUGA- Left ventricular ejection fracture greater than 70%.      05/21/2014 Breast MRI    No suspicious masses or enhancement within the breasts. No axillary adenopathy.      05/22/2014 - 07/03/2014 Antibody Plan    Herceptin/Perjeta/Tamoxifen      06/12/2014 - 07/03/2014 Chemotherapy    Taxotere added secondary to persistent abdominal and back pain      06/17/2014 - 06/19/2014 Hospital Admission    Neutropenia, fever, diarrhea, nausea, vomiting      06/20/2014 - 07/10/2014 Radiation Therapy    Dr. Thea Silversmith 12 fractions to L3-S3 (30 Gy) and left scapula (20 Gy).       07/03/2014 Adverse Reaction    Perjeta- induced diarrhea.  Perjeta discontinued      07/16/2014 - 07/20/2014 Hospital Admission    Electrolyte abnormalities, and diarrhea.  Suspect Perjeta-induced diarrhea.   Negative GI work-up.      07/24/2014 - 08/19/2015 Chemotherapy    Herceptin/Tamoxifen/Xgeva      08/21/2014 Imaging    MUGA- Left ventricular ejection fraction equals 71%.      08/24/2014 PET scan    Dramatic reduction in metabolic activity of the widespread liver metastasis. Liver metastasis now have metabolic activity equal to background normal liver activity. Liver has a nodular contour. Marked reduction in metabolic activity of skeletal lesions..      10/05/2014 Progression    Widespread metastatic disease to the brain as described. Between 20 and 30 intracranial metastatic deposits are now seen. No midline shift or incipient herniation      10/09/2014 - 10/26/2014 Radiation Therapy    Whole Brain XRT      11/14/2014 Imaging    MUGA- LVEF 67%      02/13/2015 Imaging    MUGA- LVEF 59%      02/15/2015 Treatment Plan Change    Due to declining LVEF, will hold Herceptin per PI guidelines.      04/12/2015 -  Chemotherapy    Herceptin restarted      06/02/2015 - 06/08/2015 Hospital Admission    Pneumonia      07/05/2015 Progression     PET/CT concern for mild progression of skeletal metastasis with several lesions within the  spine and 1 lesion in the Left iliac wing with mild to moderate metabolic activity new from prior. Rising CA 27-29      07/16/2015 - 10/23/2015 Anti-estrogen oral therapy    Arimidex      07/16/2015 Imaging    MRI brain with satisfactory post treatment apperance of brain. interval resolved enhancing R caudate metastasis, minimal punctate residual enhancing metastatic disease at the inferior L cerebellum. No new metastatic disease or new intracranial abnormality      07/19/2015 Treatment Plan Change    Discontinue Tamoxifen, Zoladex plus Arimidex.       08/27/2015 Procedure    Laparoscopic bilateral salpingo-oophorectomy by Dr. Gaetano Net      10/17/2015 PET scan    Osseous metastatic disease appears slightly progressive based on a new right scapular lesion and  increased uptake within lesions in the thoracic spine, left iliac wing and  proximal right femur.      10/17/2015 Progression    Slight progression on PET scan imaging.      10/18/2015 Imaging    REsolved enhancing metastatic disease to the brain status post WBXRT      10/23/2015 - 02/12/2016 Adjuvant Chemotherapy    Faslodex loading followed by maintenance dose.  (Herceptin continued)      11/04/2015 Imaging    MUGA- LEFT ventricular ejection fraction 51% slightly decreased in a 57% on the previous exam.      12/24/2015 Treatment Plan Change    Zometa every 28 days.  Xgeva discontinued.        01/01/2016 Imaging    MUGA- Left ventricular ejection fraction equals 57.9%. This is increased from 51.1% previously.      02/03/2016 PET scan    1. Mixed metabolic changes in the scattered hypermetabolic sclerotic osseous metastases throughout the axial and proximal appendicular skeleton as detailed above. 2. No new sites of hypermetabolic metastatic disease. Stable pseudo-cirrhotic appearance of the liver due to treated liver metastases with no hypermetabolic liver metastases.       02/03/2016 Progression    PET shows mixed osseous response.  Some lesions more hypermetabolic, others improved.      02/05/2016 Treatment Plan Change    D/C Herceptin.  Faslodex as scheduled on 02/12/2016, then discontinued.  Continue Zometa.      02/05/2016 Treatment Plan Change    Prescriptions for Xeloda 7 days on and 7 days off and Tykerb printed and provided for authorization.      02/19/2016 -  Chemotherapy    Xeloda 2300 mg BID 7 days on and 7 days off and Tykerb       02/24/2016 Treatment Plan Change    Xeloda dose reduced by 10% to 2000 mg BID week on and week off.       CURRENT THERAPY: XELODA/Tykerb/Zometa  INTERVAL HISTORY: [name] 50 y.o. female returns for follow-up of stage IV adenocarcinoma of the breast, ER+, HER 2 + disease. History of brain metastases treated with  WBRT.  Mrs. Ballweg was here with husband today. She is on the 3rd day of XELODA (7 on, 7 off).  She states that she is nauseous today. While waiting in the waiting room, one of the patients "smelled", which made the nausea worse.   She has no appetite. She has been taking nausea medication, Zofran, after she takes the XELODA. She denies any bowel problems.   She has been experiencing tingling, and coldness in her feet. She has been using a cream to help relieve that.  She states that she has had mouth sores in the corner of her mouth. Her tongue is also "raw". She has been drinking water constantly to help relieve this.   She states that her emotions are different. She "burst out crying" in the shower yesterday for no reason. She feels overwhelmed. Today she was tearful and stressed.   After her breast reduction years ago, she had swelling and pain on the sides of her breasts at the incision sites that she was told would go away after time. However, she states that she still has some aching pain there. She says that she wears very supportive underwire bras.   She is still fatigued, but she says it's been there from the start so it's "the new normal".  This morning, when she suddenly looked up, she experienced a "shooting" pain in the back of her neck.    She has received her flu and pneumonia shot. No vomiting, no headaches.    MEDICAL HISTORY: Past Medical History:  Diagnosis Date  . Anticoagulated    xarelto  . Anxiety   . Breast cancer metastasized to multiple sites [facility])    liver, brain, and bone  . Breast cancer, stage 4 [facility]) oncologist-  dr Larene [place] ([facility])   dx 12/ 2015 -- breast cancer Stage 4,  ER/HER2 +,  w/  liver, brain and  bone mets/  chemotherapy and radiation therapy  . Chronic pain syndrome    secondary to cancer   . Depression   . Drug-induced cardiomyopathy ([place])    per last MUGA (08-08-2015), ef 56.5/ per last echo 05-18-2014 ef 60-65%   . Family history of prostate cancer   . GERD (gastroesophageal reflux disease)   . History of colon polyps    07-13-2013  benign  . History of DVT (deep vein thrombosis)    07-09-2014  upper right extremity-  RIJ and right subclavian--  resolved  . History of gastritis    erosive  . History of pneumonia    HCAP 06-07-2015--  resolved per cxr 07-04-2015  . History of radiation therapy    12 fractions to L3 - S3, 30Gy and left spacula 20Gy (06-20-2014 to 07-10-2014) //  whole brain rxt (10-09-2014 to 10-26-2014)  . History of small bowel obstruction    S/P RESECTION 2008  . Migraine   . PONV (postoperative nausea and vomiting)    pt states scope patch does well    has Hyperlipidemia; Depression; Breast cancer, stage 4 ([place]); Bone metastases ([place]); DVT (deep venous thrombosis) ([place]); GERD (gastroesophageal reflux disease); Family history of prostate cancer; Genetic testing; Brain metastases ([place]); Drug-induced cardiomyopathy ([place]); HCAP (healthcare-associated pneumonia); Pneumonia; Metastatic breast cancer ([place]); and Esophageal reflux on her problem list.      has No Known Allergies.  Ms. Stang had no medications administered during this visit.  SURGICAL HISTORY: Past Surgical History:  Procedure Laterality Date  . BREAST REDUCTION SURGERY  03/17/2011   Procedure: MAMMARY REDUCTION BILATERAL (BREAST);  Surgeon: Mary A Contogiannis;  Location: [facility];  Service: Plastics;  Laterality: Bilateral;  . CATARACT EXTRACTION W/ INTRAOCULAR LENS  IMPLANT, BILATERAL  2008  . CERVICAL FUSION  2003   C5 -- C6  . COLONOSCOPY N/A 07/13/2013   Procedure: COLONOSCOPY;  Surgeon: Rogene Houston, MD;  [facility];  Service: Endoscopy;  Laterality: N/A;  930  . COLONOSCOPY N/A 11/26/2014   Procedure: COLONOSCOPY;  Surgeon: Rogene Houston, MD;  [facility];  Service: Endoscopy;  Laterality: N/A;  730  . D & C HYSTEROSCOPY/  RESECTION ENDOMETRIAL MASS/   [place] ENDOMETRIAL ABLATION  04-11-2010  . DX LAPAROSCOPY W/ PARTIAL SMALL BOWEL RESECTION AND APPENDECTOMY  04-13-2007  . ESOPHAGOGASTRODUODENOSCOPY N/A 05/25/2014   Procedure: ESOPHAGOGASTRODUODENOSCOPY (EGD);  Surgeon: Rogene Houston, MD;  [facility];  Service: Endoscopy;  Laterality: N/A;  155  . KNEE ARTHROSCOPY Right 2005  . LAPAROSCOPIC ASSISTED VAGINAL HYSTERECTOMY  10-13-2010   w/ Bx Left Fallopian tube and Aspiration Right Ovarian Cyst  . LAPAROSCOPIC CHOLECYSTECTOMY  11-17-2002  . LAPAROSCOPIC SALPINGO OOPHERECTOMY Bilateral 08/26/2015   Procedure: LAPAROSCOPIC SALPINGO OOPHORECTOMY, bilateral;  Surgeon: Everlene Farrier, MD;  Location: [facility];  Service: Gynecology;  Laterality: Bilateral;  . PORTACATH PLACEMENT  05-17-2014  . SHOULDER ARTHROSCOPY WITH ROTATOR CUFF REPAIR Right 2002  . TRANSTHORACIC ECHOCARDIOGRAM  05-18-2014   ef 60-65%//   last MUGA  (08-08-2015)  ef 56.6%      SOCIAL HISTORY: Social History   Social History  . Marital status: Married    Spouse name: N/A  . Number of children: N/A  . Years of education: N/A   Occupational History  . Not on file.   Social History Main Topics  . Smoking status: Former Smoker    Types: Cigarettes    Quit date: 08/20/1994  . Smokeless tobacco: Never Used  . Alcohol use Yes     Comment: Occasionally  . Drug use: No  . Sexual activity: Not on file   Other Topics Concern  . Not on file   Social History Narrative  . No narrative on file    FAMILY HISTORY: Family History  Problem Relation Age of Onset  . Diabetes Father   . Heart attack Maternal Grandmother 30    multiple over lifetime.  . Cancer Maternal Grandmother 65    NOS  . Prostate cancer Maternal Grandfather     dx in his 41s  . Lung cancer Paternal Grandfather     dx <50  . Lymphoma Maternal Aunt     dx in her 72s  . Melanoma Cousin 47    maternal first cousin  . Brain cancer Cousin     paternal first cousin dx  under 39  . Prostate cancer Other     MGF's father  . Colon cancer Other     MGM's mother  Her son lives in [place]. Her daughter lives in [place].  Review of Systems  Constitutional: Positive for malaise/fatigue.  HENT: Negative.        Mouth and tongue sores  Eyes: Negative.   Respiratory: Negative.   Cardiovascular: Negative.   Gastrointestinal: Positive for nausea.  Genitourinary: Negative.   Musculoskeletal: Positive for neck pain.       Aching on sides of breasts, where incisions are Sharp pain in back of neck after sudden upward movement  Skin: Negative.   Neurological: Positive for tingling.       Tingling and coldness in feet  Endo/Heme/Allergies: Negative.   Psychiatric/Behavioral: Negative.        Patient is tearful and stressed about how she is feeling and her diagnosis.   All other systems reviewed and are negative. 14 point review of systems was performed and is negative except as detailed under history of present illness and above   PHYSICAL EXAMINATION  ECOG PERFORMANCE STATUS: 1 - Symptomatic but completely ambulatory  Vitals with BMI 03/18/2016  Height   Weight 248  lbs 13 oz  BMI   Systolic 629  Diastolic 89  Pulse 88  Respirations 18   Physical Exam  Constitutional: She is oriented to person, place, and time and well-developed, well-nourished, and in no distress.  Patient was able to get on exam table without assistance  HENT:  Head: Normocephalic and atraumatic.  Mouth/Throat: No oropharyngeal exudate.  Eyes: EOM are normal. Pupils are equal, round, and reactive to light. Right eye exhibits no discharge. Left eye exhibits no discharge. No scleral icterus.  Neck: Normal range of motion. Neck supple. No thyromegaly present.  Cardiovascular: Normal rate, regular rhythm and normal heart sounds.  Exam reveals no gallop and no friction rub.   No murmur heard. Pulmonary/Chest: Effort normal and breath sounds normal. No respiratory distress. She has no  wheezes. She has no rales. She exhibits no tenderness.    Abdominal: Soft. Bowel sounds are normal. She exhibits no distension and no mass. There is no tenderness. There is no rebound and no guarding.  Musculoskeletal: Normal range of motion. She exhibits no edema or tenderness.  Lymphadenopathy:    She has no cervical adenopathy.  Neurological: She is alert and oriented to person, place, and time. She displays normal reflexes. No cranial nerve deficit. Gait normal. Coordination normal.  Skin: Skin is warm and dry.  Psychiatric: Mood, memory, affect and judgment normal.  Nursing note and vitals reviewed.   LABORATORY DATA: I have reviewed the data as listed. Results for LILLIEN, PETRONIO (MRN [id number])   Ref. Range 03/18/2016 12:04  Sodium Latest Ref Range: 135 - 145 mmol/L 134 (L)  Potassium Latest Ref Range: 3.5 - 5.1 mmol/L 3.8  Chloride Latest Ref Range: 101 - 111 mmol/L 100 (L)  CO2 Latest Ref Range: 22 - 32 mmol/L 28  BUN Latest Ref Range: 6 - 20 mg/dL 11  Creatinine Latest Ref Range: 0.44 - 1.00 mg/dL 0.77  Calcium Latest Ref Range: 8.9 - 10.3 mg/dL 8.9  EGFR (Non-African Amer.) Latest Ref Range: >60 mL/min >60  EGFR (African American) Latest Ref Range: >60 mL/min >60  Glucose Latest Ref Range: 65 - 99 mg/dL 109 (H)  Anion gap Latest Ref Range: 5 - 15  6  Alkaline Phosphatase Latest Ref Range: 38 - 126 U/L 92  Albumin Latest Ref Range: 3.5 - 5.0 g/dL 3.7  AST Latest Ref Range: 15 - 41 U/L 21  ALT Latest Ref Range: 14 - 54 U/L 17  Total Protein Latest Ref Range: 6.5 - 8.1 g/dL 6.9  Total Bilirubin Latest Ref Range: 0.3 - 1.2 mg/dL 0.3  WBC Latest Ref Range: 4.0 - 10.5 K/uL 6.4  RBC Latest Ref Range: 3.87 - 5.11 MIL/uL 4.15  Hemoglobin Latest Ref Range: 12.0 - 15.0 g/dL 12.1  HCT Latest Ref Range: 36.0 - 46.0 % 37.6  MCV Latest Ref Range: 78.0 - 100.0 fL 90.6  MCH Latest Ref Range: 26.0 - 34.0 pg 29.2  MCHC Latest Ref Range: 30.0 - 36.0 g/dL 32.2  RDW Latest Ref Range:  11.5 - 15.5 % 16.2 (H)  Platelets Latest Ref Range: 150 - 400 K/uL 208  Neutrophils Latest Units: % 81  Lymphocytes Latest Units: % 11  Monocytes Relative Latest Units: % 8  Eosinophil Latest Units: % 0  Basophil Latest Units: % 0  NEUT# Latest Ref Range: 1.7 - 7.7 K/uL 5.2  Lymphocyte # Latest Ref Range: 0.7 - 4.0 K/uL 0.7  Monocyte # Latest Ref Range: 0.1 - 1.0 K/uL 0.5  Eosinophils Absolute Latest  Ref Range: 0.0 - 0.7 K/uL 0.0  Basophils Absolute Latest Ref Range: 0.0 - 0.1 K/uL 0.0    RADIOLOGY: I have personally reviewed the radiological images as listed and agreed with the findings in the report. Study Result   CLINICAL DATA:  Subsequent treatment strategy for stage IV breast cancer with bone metastases, presenting for restaging.  EXAM: NUCLEAR MEDICINE PET SKULL BASE TO THIGH  TECHNIQUE: 12.3 mCi F-18 FDG was injected intravenously. Full-ring PET imaging was performed from the skull base to thigh after the radiotracer. CT data was obtained and used for attenuation correction and anatomic localization.  FASTING BLOOD GLUCOSE:  Value: 100 seconds mg/dl  COMPARISON:  10/17/2015 PET-CT.  FINDINGS: NECK  No hypermetabolic lymph nodes in the neck. Symmetric hypermetabolism in the glottis without CT correlate, probably physiologic.  CHEST  No hypermetabolic axillary, mediastinal or hilar nodes. Right internal jugular MediPort terminates in the lower third of the superior vena cava. No acute consolidative airspace disease or significant pulmonary nodules.  ABDOMEN/PELVIS  Stable pseudocirrhotic appearance of the liver due to the prior treated liver metastases. No hypermetabolic foci in the liver. Low-attenuation 1.5 cm focus in the posterior right liver lobe is stable and non hypermetabolic. Cholecystectomy. Stable postsurgical changes in the distal small bowel. Hysterectomy.  No abnormal hypermetabolic activity within the pancreas, adrenal glands, or  spleen. No hypermetabolic lymph nodes in the abdomen or pelvis.  SKELETON  There are mixed changes in the hypermetabolic sclerotic osseous metastases throughout the axial and proximal appendicular skeleton.  The following sclerotic osseous metastases demonstrate increased metabolism:  - left central sacral metastasis with max SUV 6.4, previously 3.5  - left T10 posterior element metastasis with max SUV 9.9, previously 6.3  - right sacral metastasis with max SUV 12.4, previously 6.7  The following sclerotic osseous metastases demonstrate decreased metabolism:  - right femoral head metastasis with max SUV 11.1, previously 11.5  - left iliac wing metastasis with max SUV 12.6, previously 13.4  - right posterior T6 metastasis with max SUV 11.8, previously 15.8  - medial inferior left clavicular metastasis with max SUV 5.3, previously 7.6  No new hypermetabolic osseous metastases.  IMPRESSION: 1. Mixed metabolic changes in the scattered hypermetabolic sclerotic osseous metastases throughout the axial and proximal appendicular skeleton as detailed above. 2. No new sites of hypermetabolic metastatic disease. Stable pseudo-cirrhotic appearance of the liver due to treated liver metastases with no hypermetabolic liver metastases.   Electronically Signed   By: Ilona Sorrel M.D.   On: 02/03/2016 13:34     ASSESSMENT and THERAPY PLAN:  Stage IV ER positive, HER-2 positive carcinoma of the breast Bone metastases Widespread Brain Metastases Insomnia Anxiety UE DVT, RIJ and R subclavian Cancer related Fatigue Decline in EF, Herceptin held. Herceptin restarted on 04/12/2015 06/2014 progression of disease (bone only) with rise in tumor markers, abnormal PET Arimidex, herceptin, XGEVA neuropathy  Clinically Nalee continues to do well.   I have recommended that she use SANCUSO for her ongoing nausea with her oral chemotherapy. I think this will offer her the best  control. I have dose reduced her XELODA because of her complaints of mouth soreness.   We discussed her switching to a sports bra and avoiding underwire bras.    We have discussed the [employer] program at the [facility] to encourage ongoing physical activity.    I have scheduled her for a shingles vaccination.   She will return for a follow up in 2 weeks.  NCCN guidelines recommends the  following for monitoring of metastatic breast cancer:  A. Components of monitoring:   1. Monitoring includes periodic assessment of varied combinations of symptoms, physical examination, routine laboratory tests, imaging studies, and blood biomarkers where appropriate. Results of monitoring are classified as response/continued response to treatment, stable disease, uncertainty regarding disease status, or progression of disease. The clinician typically must assess and balance multiple different forms of information to make a determination regarding whether disease is being controlled and the toxicity of treatment is acceptable. Sometimes, this information may be contradictory.  B. Definition of disease progression:   1. Unequivocal evidence of progression of disease by one or more of these factors is required to establish progression of disease, either because of ineffective therapy or acquired resistance of disease to an applied therapy.  Progression of disease may be identified through evidence of growth or worsening of disease at previously known sites of disease and/or of the occurrence of new sites of metastatic disease.   2. Findings concerning for progression of disease include:    A. Worsening symptoms such as pain or dyspnea    B. Evidence of worsening or new disease on physical examination.    C. Declining performance status    D. Unexplained weight loss    E. Increasing Alkaline phosphatase, ALT, AST, or bilirubin    F. Hypercalcemia    G. New radiographic abnormality or increase in the size of  pre-existing radiographic abnormality.    H. New areas of abnormality on functional imaging (eg, bone scan, PET/CT scan)    I. Increasing tumor markers (eg, CEA, CA 15-3, CA27.29)  All questions were answered. The patient knows to call the clinic with any problems, questions or concerns. We can certainly see the patient much sooner if necessary.   This note was signed electronically.  This document serves as a record of services personally performed by Ancil Linsey, MD. It was created on her behalf by Martinique Casey, a trained medical scribe. The creation of this record is based on the scribe's personal observations and the provider's statements to them. This document has been checked and approved by the attending provider.  I have reviewed the above documentation for accuracy and completeness, and I agree with the above.  Kelby Fam. Whitney Muse, MD

## 2016-03-18 NOTE — Patient Instructions (Signed)
[place] at [facility] Discharge Instructions  RECOMMENDATIONS MADE BY THE CONSULTANT AND ANY TEST RESULTS WILL BE SENT TO YOUR REFERRING PHYSICIAN.  Received Zometa today. Follow-up as scheduled.Call clinic for any questions or concerns  Thank you for choosing [place] at [facility] to provide your oncology and hematology care.  To afford each patient quality time with our provider, please arrive at least 15 minutes before your scheduled appointment time.   Beginning January 23rd 2017 lab work for the [facility] will be done in the  Main lab at [facility] on 1st floor. If you have a lab appointment with the [facility] please come in thru the  Main Entrance and check in at the main information desk  You need to re-schedule your appointment should you arrive 10 or more minutes late.  We strive to give you quality time with our providers, and arriving late affects you and other patients whose appointments are after yours.  Also, if you no show three or more times for appointments you may be dismissed from the clinic at the providers discretion.     Again, thank you for choosing [facility]  Our hope is that these requests will decrease the amount of time that you wait before being seen by our physicians.       _____________________________________________________________  Should you have questions after your visit to [facility], please contact our office at [phone] between the hours of 8:30 a.m. and 4:30 p.m.  Voicemails left after 4:30 p.m. will not be returned until the following business day.  For prescription refill requests, have your pharmacy contact our office.         Resources For Cancer Patients and their Caregivers ? American Cancer Society: Can assist with transportation, wigs, general needs, runs Look Good Feel Better.        903-368-1178 ? Cancer Care: Provides financial  assistance, online support groups, medication/co-pay assistance.  1-800-813-HOPE (208) 435-5151) ? [place] Assists [place] cancer patients and their families through emotional , educational and financial support.  719-027-4992 ? [place] DSS Where to apply for food stamps, Medicaid and utility assistance. 210 671 6683 ? [employer]: Transportation to medical appointments. (631)094-7439 ? Social Security Administration: May apply for disability if have a Stage IV cancer. (239) 582-5395 (445)364-2047 ? [employer], Disability and Transit Services: Assists with nutrition, care and transit needs. [place] Support Programs: @10RELATIVEDAYS @ > Cancer Support Group  2nd Tuesday of the month 1pm-2pm, Journey Room  > Creative Journey  3rd Tuesday of the month 1130am-1pm, Journey Room  > Look Good Feel Better  1st Wednesday of the month 10am-12 noon, Journey Room (Call [facility] to register [phone])

## 2016-03-18 NOTE — Progress Notes (Signed)
[name] tolerated Zometa infusion well without complaints or incident. Labs reviewed and pt denied any tooth/jaw pain prior to administering Zometa. Pt continues to take her Tykerb and Xeloda as directed without any issues. Pt discharged self ambulatory in satisfactory condition with husband

## 2016-03-20 ENCOUNTER — Other Ambulatory Visit (HOSPITAL_COMMUNITY): Payer: Self-pay | Admitting: Emergency Medicine

## 2016-03-20 MED ORDER — GRANISETRON 3.1 MG/24HR TD PTCH: MEDICATED_PATCH | TRANSDERMAL | 1 refills | Status: DC

## 2016-03-20 NOTE — Progress Notes (Signed)
Called pt to see if she had filled the sancuso patch yet.  She states that she only got a sample and not a prescription.  I sent her prescription over to [facility] drug and I told her to call me Monday and let know how the process was going.  I have a co-pay card for assistance if she needs it.

## 2016-03-22 ENCOUNTER — Encounter (HOSPITAL_COMMUNITY): Payer: Self-pay | Admitting: Hematology & Oncology

## 2016-03-23 ENCOUNTER — Encounter (HOSPITAL_COMMUNITY): Payer: 59

## 2016-03-23 ENCOUNTER — Other Ambulatory Visit (HOSPITAL_COMMUNITY): Payer: Self-pay | Admitting: Oncology

## 2016-03-23 ENCOUNTER — Encounter (HOSPITAL_COMMUNITY)
Admission: RE | Admit: 2016-03-23 | Discharge: 2016-03-23 | Disposition: A | Discharge status: Home / Self Care | Payer: 59 | Source: Ambulatory Visit | Attending: Oncology | Admitting: Oncology

## 2016-03-23 ENCOUNTER — Encounter (HOSPITAL_COMMUNITY): Payer: Self-pay

## 2016-03-23 ENCOUNTER — Other Ambulatory Visit (HOSPITAL_COMMUNITY): Payer: Self-pay

## 2016-03-23 DIAGNOSIS — Z79899 Other long term (current) drug therapy: Secondary | ICD-10-CM | POA: Diagnosis not present

## 2016-03-23 DIAGNOSIS — C50919 Malignant neoplasm of unspecified site of unspecified female breast: Secondary | ICD-10-CM | POA: Diagnosis present

## 2016-03-23 DIAGNOSIS — R350 Frequency of micturition: Secondary | ICD-10-CM

## 2016-03-23 DIAGNOSIS — C229 Malignant neoplasm of liver, not specified as primary or secondary: Secondary | ICD-10-CM | POA: Diagnosis not present

## 2016-03-23 LAB — URINALYSIS, ROUTINE W REFLEX MICROSCOPIC
Bilirubin Urine: NEGATIVE
GLUCOSE, UA: NEGATIVE mg/dL
Ketones, ur: NEGATIVE mg/dL
Leukocytes, UA: NEGATIVE
Nitrite: NEGATIVE
PROTEIN: NEGATIVE mg/dL
SPECIFIC GRAVITY, URINE: 1.015 (ref 1.005–1.030)
pH: 5.5 (ref 5.0–8.0)

## 2016-03-23 LAB — URINE MICROSCOPIC-ADD ON
BACTERIA UA: NONE SEEN
Squamous Epithelial / LPF: NONE SEEN

## 2016-03-23 MED ORDER — CAPECITABINE 500 MG PO TABS: ORAL_TABLET | ORAL | 1 refills | Status: DC

## 2016-03-23 MED ORDER — TECHNETIUM TC 99M-LABELED RED BLOOD CELLS IV KIT
20.0000 | PACK | Freq: Once | INTRAVENOUS | Status: AC | PRN
  Administered 2016-03-23: 18.2 via INTRAVENOUS

## 2016-03-23 MED ORDER — HEPARIN SOD (PORK) LOCK FLUSH 100 UNIT/ML IV SOLN
INTRAVENOUS | Status: AC
  Filled 2016-03-23: qty 5

## 2016-03-23 NOTE — Telephone Encounter (Signed)
Called patient and informed her to come in today to get a UA with Reflex and culture and sensitivity per Illene Bolus PA-C

## 2016-03-24 ENCOUNTER — Other Ambulatory Visit (HOSPITAL_COMMUNITY): Payer: Self-pay | Admitting: Oncology

## 2016-03-24 ENCOUNTER — Telehealth (HOSPITAL_COMMUNITY): Payer: Self-pay | Admitting: Hematology & Oncology

## 2016-03-24 DIAGNOSIS — C50919 Malignant neoplasm of unspecified site of unspecified female breast: Secondary | ICD-10-CM

## 2016-03-24 MED ORDER — LAPATINIB DITOSYLATE 250 MG PO TABS: 1250.0000 mg | ORAL_TABLET | Freq: Every day | ORAL | 1 refills | Status: DC

## 2016-03-24 NOTE — Telephone Encounter (Signed)
PC TO BOTH [employer] AND [employer] RE SANCUSO PLAN EXCLUSION. I WAS TOLD BY BOTH REPS THAT PLAN DOES NOT AND WILL NOT COVER OR EVEN ACCEPT AN APPEAL OR PRE [place] REQ FOR THE PATCH. THEY WILL PROVIDE ALTERNATIVE SOLUTIONS IF REQUESTED.   PC TO PT RX SOLUTIONS AND WAS ADVISED TO SEND IN NEW SCRIPT AND DENIAL LETTER FROM PLAN AND 1 PATCH E 30 DAYS FOR $20 WOULD BE SUPPLIED TO THE PT.  PC TO RX REP JOSEPH@ [phone] LEFT VM TO SEE IF ANY ADDITIONAL ASSISTANCE IS AVAIL.

## 2016-03-27 ENCOUNTER — Other Ambulatory Visit: Payer: Self-pay

## 2016-03-27 ENCOUNTER — Encounter (HOSPITAL_COMMUNITY): Payer: 59 | Attending: Oncology | Admitting: Oncology

## 2016-03-27 ENCOUNTER — Encounter (HOSPITAL_COMMUNITY): Payer: 59

## 2016-03-27 ENCOUNTER — Encounter (HOSPITAL_COMMUNITY): Payer: Self-pay | Admitting: Oncology

## 2016-03-27 VITALS — BP 94/73 | HR 92 | Temp 98.2°F | Resp 18 | Ht 68.0 in | Wt 246.0 lb

## 2016-03-27 DIAGNOSIS — C50919 Malignant neoplasm of unspecified site of unspecified female breast: Secondary | ICD-10-CM

## 2016-03-27 DIAGNOSIS — C787 Secondary malignant neoplasm of liver and intrahepatic bile duct: Secondary | ICD-10-CM

## 2016-03-27 DIAGNOSIS — C7931 Secondary malignant neoplasm of brain: Secondary | ICD-10-CM | POA: Diagnosis not present

## 2016-03-27 DIAGNOSIS — R9431 Abnormal electrocardiogram [ECG] [EKG]: Secondary | ICD-10-CM | POA: Diagnosis not present

## 2016-03-27 DIAGNOSIS — C7951 Secondary malignant neoplasm of bone: Secondary | ICD-10-CM

## 2016-03-27 DIAGNOSIS — R002 Palpitations: Secondary | ICD-10-CM

## 2016-03-27 DIAGNOSIS — C229 Malignant neoplasm of liver, not specified as primary or secondary: Secondary | ICD-10-CM | POA: Diagnosis not present

## 2016-03-27 DIAGNOSIS — R309 Painful micturition, unspecified: Secondary | ICD-10-CM

## 2016-03-27 DIAGNOSIS — R5382 Chronic fatigue, unspecified: Secondary | ICD-10-CM

## 2016-03-27 LAB — COMPREHENSIVE METABOLIC PANEL
ALBUMIN: 3.8 g/dL (ref 3.5–5.0)
ALT: 19 U/L (ref 14–54)
ANION GAP: 7 (ref 5–15)
AST: 19 U/L (ref 15–41)
Alkaline Phosphatase: 99 U/L (ref 38–126)
BUN: 11 mg/dL (ref 6–20)
CHLORIDE: 98 mmol/L — AB (ref 101–111)
CO2: 32 mmol/L (ref 22–32)
Calcium: 9 mg/dL (ref 8.9–10.3)
Creatinine, Ser: 0.76 mg/dL (ref 0.44–1.00)
GFR calc Af Amer: >60 mL/min (ref >60)
GFR calc non Af Amer: >60 mL/min (ref >60)
GLUCOSE: 108 mg/dL — AB (ref 65–99)
POTASSIUM: 3.5 mmol/L (ref 3.5–5.1)
SODIUM: 137 mmol/L (ref 135–145)
TOTAL PROTEIN: 7 g/dL (ref 6.5–8.1)
Total Bilirubin: 0.5 mg/dL (ref 0.3–1.2)

## 2016-03-27 LAB — CBC WITH DIFFERENTIAL/PLATELET
BASOS ABS: 0 10*3/uL (ref 0.0–0.1)
Basophils Relative: 0 %
EOS PCT: 0 %
Eosinophils Absolute: 0 10*3/uL (ref 0.0–0.7)
HEMATOCRIT: 38.2 % (ref 36.0–46.0)
Hemoglobin: 12.3 g/dL (ref 12.0–15.0)
LYMPHS ABS: 0.9 10*3/uL (ref 0.7–4.0)
LYMPHS PCT: 18 %
MCH: 29.2 pg (ref 26.0–34.0)
MCHC: 32.2 g/dL (ref 30.0–36.0)
MCV: 90.7 fL (ref 78.0–100.0)
MONO ABS: 0.4 10*3/uL (ref 0.1–1.0)
Monocytes Relative: 7 %
NEUTROS ABS: 3.7 10*3/uL (ref 1.7–7.7)
Neutrophils Relative %: 75 %
PLATELETS: 227 10*3/uL (ref 150–400)
RBC: 4.21 MIL/uL (ref 3.87–5.11)
RDW: 16.7 % — AB (ref 11.5–15.5)
WBC: 5 10*3/uL (ref 4.0–10.5)

## 2016-03-27 MED ORDER — METHYLPHENIDATE HCL 10 MG PO TABS: ORAL_TABLET | ORAL | 0 refills | Status: DC

## 2016-03-27 MED ORDER — HEPARIN SOD (PORK) LOCK FLUSH 100 UNIT/ML IV SOLN
INTRAVENOUS | Status: AC
  Filled 2016-03-27: qty 5

## 2016-03-27 MED ORDER — CAPECITABINE 500 MG PO TABS: ORAL_TABLET | ORAL | 1 refills | Status: DC

## 2016-03-27 MED ORDER — SODIUM CHLORIDE 0.9% FLUSH
10.0000 mL | INTRAVENOUS | Status: DC | PRN
  Administered 2016-03-27: 10 mL via INTRAVENOUS
  Filled 2016-03-27: qty 10

## 2016-03-27 MED ORDER — SODIUM CHLORIDE 0.9% FLUSH
20.0000 mL | INTRAVENOUS | Status: DC | PRN
Start: 2016-03-27 — End: 2016-03-27

## 2016-03-27 MED ORDER — HEPARIN SOD (PORK) LOCK FLUSH 100 UNIT/ML IV SOLN
500.0000 [IU] | Freq: Once | INTRAVENOUS | Status: AC
  Administered 2016-03-27: 500 [IU] via INTRAVENOUS

## 2016-03-27 NOTE — Assessment & Plan Note (Addendum)
Stage IV adenocarcinoma of the breast, ER+, HER 2 + disease.  S/P BSO by Dr. Gaetano Net on 08/27/2015.  Oncology history is updated.  Current therapy is Xeloda/Tykerb/Zometa.  She was initially started on 2300 mg of Xeloda BID but this has now been dose reduced to 2000 mg in AM and 1500 mg in PM due to intolerance.    Currently: on her week of Xeloda.  She is off for 1 week beginning on Monday 03/30/2016.  Labs today: CBC diff, CMET.  I personally reviewed and went over laboratory results with the patient.  The results are noted within this dictation.  MUGA was completed on 03/23/2016.  I personally reviewed and went over laboratory results with the patient.  The results are noted within this dictation.  Next MUGA is due in Feb 2018.  Order is placed.  With dose reduction, her nausea is improved.  Mouth sore has resolved.  She notes intermittent palpitation with SOB.  She notes occurrences x 3.  Will order EKG today.  EKG is stable compared to Jan 2017 EKG.  She notes issues with her urine.  She reports urinary pain and UA recently is negative for infection.  She now has low pelvic pain that is improving.  If not better in the next week, she will call and referral to Urology may be appropriate.  Sancuso patch fell off while showering.  She report PO Zofran is just as effective for nausea control.  She does not want to pursue Sancuso at this time.  Labs in 2-3 weeks: CBC diff, CMET, CA 27.29, and CA 15-3.  Zometa is due in 3 weeks.  Return in 2-3 weeks for follow-up.

## 2016-03-27 NOTE — Progress Notes (Signed)
Allison Labrum, MD [place] [address]  Malignant neoplasm of breast, stage 4, unspecified laterality ([place]) - Plan: CBC with Differential, Comprehensive metabolic panel, Cancer antigen 27.29, Cancer antigen 15-3, NM Cardiac Muga Rest  Palpitations - Plan: EKG 12-Lead  Metastatic breast cancer (HCC) - Plan: capecitabine (XELODA) 500 MG tablet  Chronic fatigue - Plan: methylphenidate (RITALIN) 10 MG tablet  Breast cancer, stage 4, unspecified laterality (HCC) - Plan: methylphenidate (RITALIN) 10 MG tablet  Adenocarcinoma determined by biopsy of liver ([place]) - Plan: methylphenidate (RITALIN) 10 MG tablet  CURRENT THERAPY: Xeloda 2000 mg in AM and 1500 mg in PM (initial dose was 2300 mg BID) and Tykerb 1250 mg daily.  INTERVAL HISTORY: [name] 50 y.o. female returns for followup of Stage IV breast cancer to bones, ER+/HER2+    Breast cancer, stage 4 ([place])   04/25/2014 Initial Diagnosis    Breast cancer, stage 4      04/25/2014 Imaging    CT abdomen/pelvis with widespread metastatic disease to the liver, multiple lytic lesions throughout spine and pelvis. No FX or epidural tumor identified      04/26/2014 Imaging    CT head unremarkable      04/26/2014 Imaging    CT chest with no lung mass or pulmonary nodules, no adenopathy. Lytic bone lesions, right 2nd rib      04/27/2014 Initial Biopsy    U/S guided liver biopsy, lesion in anterior and inferior left hepatic lobe biopsied      04/27/2014 Pathology Results    Metastatic adenocarcinoma, CK7, ER+, patchy positivity with PR. Possible primary includes breast, less likely gynecologic      05/15/2014 Mammogram    BI-RADS CATEGORY  2: Benign Finding(s)      05/16/2014 PET scan    1. Intensely hypermetabolic hepatic metastasis. 2. Widespread hypermetabolic skeletal lesions. 3. No primary adenocarcinoma identified by FDG PET imaging.      05/19/2014 Imaging    MUGA- Left ventricular ejection  fracture greater than 70%.      05/21/2014 Breast MRI    No suspicious masses or enhancement within the breasts. No axillary adenopathy.      05/22/2014 - 07/03/2014 Antibody Plan    Herceptin/Perjeta/Tamoxifen      06/12/2014 - 07/03/2014 Chemotherapy    Taxotere added secondary to persistent abdominal and back pain      06/17/2014 - 06/19/2014 Hospital Admission    Neutropenia, fever, diarrhea, nausea, vomiting      06/20/2014 - 07/10/2014 Radiation Therapy    Dr. Thea Silversmith 12 fractions to L3-S3 (30 Gy) and left scapula (20 Gy).       07/03/2014 Adverse Reaction    Perjeta- induced diarrhea.  Perjeta discontinued      07/16/2014 - 07/20/2014 Hospital Admission    Electrolyte abnormalities, and diarrhea.  Suspect Perjeta-induced diarrhea.  Negative GI work-up.      07/24/2014 - 08/19/2015 Chemotherapy    Herceptin/Tamoxifen/Xgeva      08/21/2014 Imaging    MUGA- Left ventricular ejection fraction equals 71%.      08/24/2014 PET scan    Dramatic reduction in metabolic activity of the widespread liver metastasis. Liver metastasis now have metabolic activity equal to background normal liver activity. Liver has a nodular contour. Marked reduction in metabolic activity of skeletal lesions..      10/05/2014 Progression    Widespread metastatic disease to the brain as described. Between 20 and 30 intracranial metastatic deposits are  now seen. No midline shift or incipient herniation      10/09/2014 - 10/26/2014 Radiation Therapy    Whole Brain XRT      11/14/2014 Imaging    MUGA- LVEF 67%      02/13/2015 Imaging    MUGA- LVEF 59%      02/15/2015 Treatment Plan Change    Due to declining LVEF, will hold Herceptin per PI guidelines.      04/12/2015 -  Chemotherapy    Herceptin restarted      06/02/2015 - 06/08/2015 Hospital Admission    Pneumonia      07/05/2015 Progression     PET/CT concern for mild progression of skeletal metastasis with several lesions within the spine and 1  lesion in the Left iliac wing with mild to moderate metabolic activity new from prior. Rising CA 27-29      07/16/2015 - 10/23/2015 Anti-estrogen oral therapy    Arimidex      07/16/2015 Imaging    MRI brain with satisfactory post treatment apperance of brain. interval resolved enhancing R caudate metastasis, minimal punctate residual enhancing metastatic disease at the inferior L cerebellum. No new metastatic disease or new intracranial abnormality      07/19/2015 Treatment Plan Change    Discontinue Tamoxifen, Zoladex plus Arimidex.       08/27/2015 Procedure    Laparoscopic bilateral salpingo-oophorectomy by Dr. Gaetano Net      10/17/2015 PET scan    Osseous metastatic disease appears slightly progressive based on a new right scapular lesion and increased uptake within lesions in the thoracic spine, left iliac wing and  proximal right femur.      10/17/2015 Progression    Slight progression on PET scan imaging.      10/18/2015 Imaging    REsolved enhancing metastatic disease to the brain status post WBXRT      10/23/2015 - 02/12/2016 Adjuvant Chemotherapy    Faslodex loading followed by maintenance dose.  (Herceptin continued)      11/04/2015 Imaging    MUGA- LEFT ventricular ejection fraction 51% slightly decreased in a 57% on the previous exam.      12/24/2015 Treatment Plan Change    Zometa every 28 days.  Xgeva discontinued.        01/01/2016 Imaging    MUGA- Left ventricular ejection fraction equals 57.9%. This is increased from 51.1% previously.      02/03/2016 PET scan    1. Mixed metabolic changes in the scattered hypermetabolic sclerotic osseous metastases throughout the axial and proximal appendicular skeleton as detailed above. 2. No new sites of hypermetabolic metastatic disease. Stable pseudo-cirrhotic appearance of the liver due to treated liver metastases with no hypermetabolic liver metastases.       02/03/2016 Progression    PET shows mixed osseous response.   Some lesions more hypermetabolic, others improved.      02/05/2016 Treatment Plan Change    D/C Herceptin.  Faslodex as scheduled on 02/12/2016, then discontinued.  Continue Zometa.      02/05/2016 Treatment Plan Change    Prescriptions for Xeloda 7 days on and 7 days off and Tykerb printed and provided for authorization.      02/19/2016 -  Chemotherapy    Xeloda 2300 mg BID 7 days on and 7 days off and Tykerb       02/24/2016 Treatment Plan Change    Xeloda dose reduced by 10% to 2000 mg BID week on and week off.      03/18/2016 Treatment  Plan Change    Xeloda dose reduced to 2000 mg in AM and 1500 mg in PM 7 days on and 7 days off.      03/23/2016 Imaging    MUGA- Normal LEFT ventricular ejection fraction of 56% not significantly changed from 58% on previous exam.       She reports that the Sancuso patch was no more effective than Zofran, plus it fell off in the shower.  She does not want to pursue this treatment any further as a result.  She noted some urinary discomfort which resulted in [place] performing a UA in the clinic the other day.  This was negative for any indication for infection.  She notes that the urinary pain is improved, but now she has a resolving subpubic discomfort.  She notes 3 episodes of heart palpations associated with shortness of breath.  She denies any chest pain or diaphoresis.  She reports that it is likely anxiety-related.    Review of Systems  Constitutional: Negative.  Negative for chills and fever.  HENT: Negative.   Eyes: Negative.  Negative for double vision.  Respiratory: Positive for shortness of breath (associated with palpation). Negative for cough and sputum production.   Cardiovascular: Positive for palpitations. Negative for chest pain.  Gastrointestinal: Positive for nausea. Negative for constipation, diarrhea, heartburn and vomiting.  Genitourinary: Negative.  Negative for dysuria.       Subpubic discomfort   Musculoskeletal:  Negative.   Skin: Negative.  Negative for rash.  Neurological: Negative.   Endo/Heme/Allergies: Negative.   Psychiatric/Behavioral: Negative.     Past Medical History:  Diagnosis Date  . Anticoagulated    xarelto  . Anxiety   . Breast cancer metastasized to multiple sites [facility])    liver, brain, and bone  . Breast cancer, stage 4 [facility]) oncologist-  dr Larene [place] ([facility])   dx 12/ 2015 -- breast cancer Stage 4,  ER/HER2 +,  w/  liver, brain and  bone mets/  chemotherapy and radiation therapy  . Chronic pain syndrome    secondary to cancer   . Depression   . Drug-induced cardiomyopathy ([place])    per last MUGA (08-08-2015), ef 56.5/ per last echo 05-18-2014 ef 60-65%  . Family history of prostate cancer   . GERD (gastroesophageal reflux disease)   . History of colon polyps    07-13-2013  benign  . History of DVT (deep vein thrombosis)    07-09-2014  upper right extremity-  RIJ and right subclavian--  resolved  . History of gastritis    erosive  . History of pneumonia    HCAP 06-07-2015--  resolved per cxr 07-04-2015  . History of radiation therapy    12 fractions to L3 - S3, 30Gy and left spacula 20Gy (06-20-2014 to 07-10-2014) //  whole brain rxt (10-09-2014 to 10-26-2014)  . History of small bowel obstruction    S/P RESECTION 2008  . Migraine   . PONV (postoperative nausea and vomiting)    pt states scope patch does well    Past Surgical History:  Procedure Laterality Date  . BREAST REDUCTION SURGERY  03/17/2011   Procedure: MAMMARY REDUCTION BILATERAL (BREAST);  Surgeon: Mary A Contogiannis;  Location: [facility];  Service: Plastics;  Laterality: Bilateral;  . CATARACT EXTRACTION W/ INTRAOCULAR LENS  IMPLANT, BILATERAL  2008  . CERVICAL FUSION  2003   C5 -- C6  . COLONOSCOPY N/A 07/13/2013   Procedure: COLONOSCOPY;  Surgeon: Rogene Houston, MD;  [facility];  Service: Endoscopy;  Laterality: N/A;  930  . COLONOSCOPY N/A 11/26/2014     Procedure: COLONOSCOPY;  Surgeon: Rogene Houston, MD;  [facility];  Service: Endoscopy;  Laterality: N/A;  730  . D & C HYSTEROSCOPY/  RESECTION ENDOMETRIAL MASS/  [place] ENDOMETRIAL ABLATION  04-11-2010  . DX LAPAROSCOPY W/ PARTIAL SMALL BOWEL RESECTION AND APPENDECTOMY  04-13-2007  . ESOPHAGOGASTRODUODENOSCOPY N/A 05/25/2014   Procedure: ESOPHAGOGASTRODUODENOSCOPY (EGD);  Surgeon: Rogene Houston, MD;  [facility];  Service: Endoscopy;  Laterality: N/A;  155  . KNEE ARTHROSCOPY Right 2005  . LAPAROSCOPIC ASSISTED VAGINAL HYSTERECTOMY  10-13-2010   w/ Bx Left Fallopian tube and Aspiration Right Ovarian Cyst  . LAPAROSCOPIC CHOLECYSTECTOMY  11-17-2002  . LAPAROSCOPIC SALPINGO OOPHERECTOMY Bilateral 08/26/2015   Procedure: LAPAROSCOPIC SALPINGO OOPHORECTOMY, bilateral;  Surgeon: Everlene Farrier, MD;  Location: [facility];  Service: Gynecology;  Laterality: Bilateral;  . PORTACATH PLACEMENT  05-17-2014  . SHOULDER ARTHROSCOPY WITH ROTATOR CUFF REPAIR Right 2002  . TRANSTHORACIC ECHOCARDIOGRAM  05-18-2014   ef 60-65%//   last MUGA  (08-08-2015)  ef 56.6%      Family History  Problem Relation Age of Onset  . Diabetes Father   . Heart attack Maternal Grandmother 30    multiple over lifetime.  . Cancer Maternal Grandmother 77    NOS  . Prostate cancer Maternal Grandfather     dx in his 47s  . Lung cancer Paternal Grandfather     dx <50  . Lymphoma Maternal Aunt     dx in her 75s  . Melanoma Cousin 41    maternal first cousin  . Brain cancer Cousin     paternal first cousin dx under 62  . Prostate cancer Other     MGF's father  . Colon cancer Other     MGM's mother    Social History   Social History  . Marital status: Married    Spouse name: N/A  . Number of children: N/A  . Years of education: N/A   Social History Main Topics  . Smoking status: Former Smoker    Types: Cigarettes    Quit date: 08/20/1994  . Smokeless tobacco:  Never Used  . Alcohol use Yes     Comment: Occasionally  . Drug use: No  . Sexual activity: Not Asked   Other Topics Concern  . None   Social History Narrative  . None     PHYSICAL EXAMINATION  ECOG PERFORMANCE STATUS: 1 - Symptomatic but completely ambulatory  Vitals:   03/27/16 0901  BP: 94/73  Pulse: 92  Resp: 18  Temp: 98.2 F (36.8 C)    GENERAL:alert, no distress, well nourished, well developed, comfortable, cooperative, obese, smiling and unaccompanied. SKIN: skin color, texture, turgor are normal, no rashes or significant lesions HEAD: Normocephalic, No masses, lesions, tenderness or abnormalities EYES: normal, EOMI, Conjunctiva are pink and non-injected EARS: External ears normal OROPHARYNX:lips, buccal mucosa, and tongue normal and mucous membranes are moist.  No mouth lesions noted. NECK: supple, trachea midline LYMPH:  no palpable lymphadenopathy BREAST:not examined LUNGS: clear to auscultation  HEART: regular rate & rhythm ABDOMEN:abdomen soft and normal bowel sounds BACK: Back symmetric, no curvature. EXTREMITIES:less then 2 second capillary refill, no joint deformities, effusion, or inflammation, no skin discoloration, no cyanosis  NEURO: alert & oriented x 3 with fluent speech, no focal motor/sensory deficits, gait normal PELVIS: subpubic discomfort, mild, with deep palpation without  any palpable abnormalities.   LABORATORY DATA: CBC    Component Value Date/Time   WBC 6.4 03/18/2016 1204   RBC 4.15 03/18/2016 1204   HGB 12.1 03/18/2016 1204   HCT 37.6 03/18/2016 1204   PLT 208 03/18/2016 1204   MCV 90.6 03/18/2016 1204   MCH 29.2 03/18/2016 1204   MCHC 32.2 03/18/2016 1204   RDW 16.2 (H) 03/18/2016 1204   LYMPHSABS 0.7 03/18/2016 1204   MONOABS 0.5 03/18/2016 1204   EOSABS 0.0 03/18/2016 1204   BASOSABS 0.0 03/18/2016 1204      Chemistry      Component Value Date/Time   NA 134 (L) 03/18/2016 1204   K 3.8 03/18/2016 1204   CL 100 (L)  03/18/2016 1204   CO2 28 03/18/2016 1204   BUN 11 03/18/2016 1204   CREATININE 0.77 03/18/2016 1204      Component Value Date/Time   CALCIUM 8.9 03/18/2016 1204   ALKPHOS 92 03/18/2016 1204   AST 21 03/18/2016 1204   ALT 17 03/18/2016 1204   BILITOT 0.3 03/18/2016 1204      Lab Results  Component Value Date   LABCA2 59.1 (H) 02/27/2016     PENDING LABS:   RADIOGRAPHIC STUDIES:  Nm Cardiac Muga Rest  Result Date: 03/23/2016 CLINICAL DATA:  Metastatic breast cancer, on Herceptin therapy EXAM: NUCLEAR MEDICINE CARDIAC BLOOD POOL IMAGING (MUGA) TECHNIQUE: Cardiac multi-gated acquisition was performed at rest following intravenous injection of Tc-16mlabeled red blood cells. RADIOPHARMACEUTICALS:  18.2 mCi Tc-976mertechnetate in-vitro labeled autologous red blood cells IV COMPARISON:  01/01/2016 FINDINGS: LEFT ventricular ejection fraction is calculated at 56%, not significantly changed since a 58% on the previous exam. Cine analysis in 3 projections demonstrates normal LEFT ventricular wall motion. IMPRESSION: Normal LEFT ventricular ejection fraction of 56% not significantly changed from 58% on previous exam. Normal LV wall motion. Electronically Signed   By: MaLavonia Dana.D.   On: 03/23/2016 15:31     PATHOLOGY:    ASSESSMENT AND PLAN:  Breast cancer, stage 4 (HCC) Stage IV adenocarcinoma of the breast, ER+, HER 2 + disease.  S/P BSO by Dr. ToGaetano Netn 08/27/2015.  Oncology history is updated.  Current therapy is Xeloda/Tykerb/Zometa.  She was initially started on 2300 mg of Xeloda BID but this has now been dose reduced to 2000 mg in AM and 1500 mg in PM due to intolerance.    Currently: on her week of Xeloda.  She is off for 1 week beginning on Monday 03/30/2016.  Labs today: CBC diff, CMET.  I personally reviewed and went over laboratory results with the patient.  The results are noted within this dictation.  MUGA was completed on 03/23/2016.  I personally reviewed and  went over laboratory results with the patient.  The results are noted within this dictation.  Next MUGA is due in Feb 2018.  Order is placed.  With dose reduction, her nausea is improved.  Mouth sore has resolved.  She notes intermittent palpitation with SOB.  She notes occurrences x 3.  Will order EKG today.  She notes issues with her urine.  She reports urinary pain and UA recently is negative for infection.  She now has low pelvic pain that is improving.  If not better in the next week, she will call and referral to Urology may be appropriate.  Sancuso patch fell off while showering.  She report PO Zofran is just as effective for nausea control.  She does not want to pursue  Sancuso at this time.  Labs in 2-3 weeks: CBC diff, CMET, CA 27.29, and CA 15-3.  Zometa is due in 3 weeks.  Return in 2-3 weeks for follow-up.   ORDERS PLACED FOR THIS ENCOUNTER: Orders Placed This Encounter  Procedures  . NM Cardiac Muga Rest  . CBC with Differential  . Comprehensive metabolic panel  . Cancer antigen 27.29  . Cancer antigen 15-3  . EKG 12-Lead    MEDICATIONS PRESCRIBED THIS ENCOUNTER: Meds ordered this encounter  Medications  . capecitabine (XELODA) 500 MG tablet    Sig: Take 2000 mg PO in AM and 1500 mg PO in PM daily, 7 days on and 7 days off    Dispense:  98 tablet    Refill:  1    Order Specific Question:   Supervising Provider    Answer:   Patrici Ranks [id number]  . methylphenidate (RITALIN) 10 MG tablet    Sig: Take 2 in the morning and 1 at lunch    Dispense:  90 tablet    Refill:  0    take second one by 1 pm    Order Specific Question:   Supervising Provider    Answer:   Patrici Ranks [id number]    THERAPY PLAN:  Continue Xeloda/Tykerb/Zometa  All questions were answered. The patient knows to call the clinic with any problems, questions or concerns. We can certainly see the patient much sooner if necessary.  Patient and plan discussed with Dr. Ancil Linsey and she is in agreement with the aforementioned.   This note is electronically signed by: Doy Mince 03/27/2016 9:28 AM

## 2016-03-27 NOTE — Progress Notes (Signed)
[name] presented for Portacath access and flush. Proper placement of portacath confirmed by CXR. Portacath located rt chest wall accessed with  H 20 needle. Good blood return present. Portacath flushed with 17ml NS and 500U/6ml Heparin and needle removed intact. Procedure without incident. Patient tolerated procedure well.

## 2016-03-27 NOTE — Patient Instructions (Addendum)
[place] at [facility] Discharge Instructions  RECOMMENDATIONS MADE BY THE CONSULTANT AND ANY TEST RESULTS WILL BE SENT TO YOUR REFERRING PHYSICIAN.  You were seen today by Kirby Crigler PA-C. Refill given for Ritalin. Labs today, and EKG today. [place], labs and follow up in 2-3 weeks.    Thank you for choosing [place] at [facility] to provide your oncology and hematology care.  To afford each patient quality time with our provider, please arrive at least 15 minutes before your scheduled appointment time.   Beginning January 23rd 2017 lab work for the [facility] will be done in the  Main lab at [facility] on 1st floor. If you have a lab appointment with the [facility] please come in thru the  Main Entrance and check in at the main information desk  You need to re-schedule your appointment should you arrive 10 or more minutes late.  We strive to give you quality time with our providers, and arriving late affects you and other patients whose appointments are after yours.  Also, if you no show three or more times for appointments you may be dismissed from the clinic at the providers discretion.     Again, thank you for choosing [facility].  Our hope is that these requests will decrease the amount of time that you wait before being seen by our physicians.       _____________________________________________________________  Should you have questions after your visit to [facility], please contact our office at [phone] between the hours of 8:30 a.m. and 4:30 p.m.  Voicemails left after 4:30 p.m. will not be returned until the following business day.  For prescription refill requests, have your pharmacy contact our office.         Resources For Cancer Patients and their Caregivers ? American Cancer Society: Can assist with transportation, wigs, general needs, runs Look Good Feel Better.         682-863-5198 ? Cancer Care: Provides financial assistance, online support groups, medication/co-pay assistance.  1-800-813-HOPE (351)677-9311) ? [facility] Assists [place] cancer patients and their families through emotional , educational and financial support.  516-263-1156 ? [place] DSS Where to apply for food stamps, Medicaid and utility assistance. 6072671659 ? [facility]: Transportation to medical appointments. 862-149-0591 ? Social Security Administration: May apply for disability if have a Stage IV cancer. 731-612-2392 225 287 8999 ? [employer], Disability and Transit Services: Assists with nutrition, care and transit needs. [place] Support Programs: @10RELATIVEDAYS @ > Cancer Support Group  2nd Tuesday of the month 1pm-2pm, Journey Room  > Creative Journey  3rd Tuesday of the month 1130am-1pm, Journey Room  > Look Good Feel Better  1st Wednesday of the month 10am-12 noon, Journey Room (Call [place] to register [phone])

## 2016-03-30 ENCOUNTER — Encounter (HOSPITAL_COMMUNITY): Payer: Self-pay | Admitting: Oncology

## 2016-03-31 ENCOUNTER — Encounter (HOSPITAL_COMMUNITY): Payer: 59

## 2016-03-31 ENCOUNTER — Encounter (HOSPITAL_BASED_OUTPATIENT_CLINIC_OR_DEPARTMENT_OTHER): Payer: 59 | Admitting: Oncology

## 2016-03-31 ENCOUNTER — Ambulatory Visit (INDEPENDENT_AMBULATORY_CARE_PROVIDER_SITE_OTHER): Payer: 59 | Admitting: Podiatry

## 2016-03-31 ENCOUNTER — Encounter: Payer: Self-pay | Admitting: Podiatry

## 2016-03-31 ENCOUNTER — Ambulatory Visit (INDEPENDENT_AMBULATORY_CARE_PROVIDER_SITE_OTHER): Payer: 59

## 2016-03-31 VITALS — BP 112/70 | HR 86 | Resp 16

## 2016-03-31 DIAGNOSIS — M79672 Pain in left foot: Secondary | ICD-10-CM

## 2016-03-31 DIAGNOSIS — C50919 Malignant neoplasm of unspecified site of unspecified female breast: Secondary | ICD-10-CM

## 2016-03-31 DIAGNOSIS — C787 Secondary malignant neoplasm of liver and intrahepatic bile duct: Secondary | ICD-10-CM | POA: Diagnosis not present

## 2016-03-31 DIAGNOSIS — C7931 Secondary malignant neoplasm of brain: Secondary | ICD-10-CM

## 2016-03-31 DIAGNOSIS — S93602A Unspecified sprain of left foot, initial encounter: Secondary | ICD-10-CM

## 2016-03-31 DIAGNOSIS — L853 Xerosis cutis: Secondary | ICD-10-CM | POA: Diagnosis not present

## 2016-03-31 DIAGNOSIS — M722 Plantar fascial fibromatosis: Secondary | ICD-10-CM | POA: Diagnosis not present

## 2016-03-31 DIAGNOSIS — C7951 Secondary malignant neoplasm of bone: Secondary | ICD-10-CM

## 2016-03-31 NOTE — Progress Notes (Signed)
[name] is seen as a work-in today.  She called yesterday reporting blisters on her feet.  She was advised to hold her Xeloda as a result until further evaluation.  Today I learned that she was already on her week off from therapy and therefore was off the drug.  She notes sores on her feet bilaterally that are tender.  She denies any pruritis associated with the sores.  When asked about location, she reports that the sores are on the medial aspect of both ankles, inferior to the medial malleolus.  She denies any sores on the soles of her feet.  Additionally, she notes cracking of her finger tips.  These are tender.  She is using utter cream for her hands and feet.  She is also using a Derald Macleod product and I have asked her to favor the utter cream.  Gen: NAD, pleasant HEENT: Atraumatic normocephalic Neck: trachea midline Skin: Warm and dry.  See extremity exam below. Neuro: A and O x 3 Upper extremities: Skin on palms is soft and dry.  No open sores.  No erythema.  Fissures of skin on finger tips are noted.  Non-infected in appearance. Lower extremities: B/L skin erythema, linear in appearance following skin folds, on the medial aspect of ankles, inferior to medial malleolus.    RIGHT    LEFT  Assessment: 1. Xeloda-induced dry skin  Plan: 1. Recommend Utter cream to effective area.  Particularly following bathing and at HS.  She then can apply socks or gloves  2. Restart Xeloda as planned on Sunday, 04/05/2016. 3. Order placed for [employer] toxicity testing   Patient and plan discussed with Dr. Ancil Linsey and she is in agreement with the aforementioned.   Robynn Pane, PA-C 03/31/2016 5:09 PM

## 2016-03-31 NOTE — Progress Notes (Signed)
This patient presents to the office saying as she was walking down her stairs this morning and stubbed her toes on the step and her heel went down.  She says she felt immediate pain and has difficulty bearing weight on her left foot. She points to her heel as the site of the most pain.   She has history of plantar fascitis which is being treated through this office. She presents to the office for evaluation and treatment.   Objective  Neurovascular status has no changes.  There is palpable pain distal to plantar fascia insertion.  There is minimal swelling at the painful site.   No ecchymosis noted.   Plantar fascial injury  Foot Sprain.   ROV  Xrays taken revealing no bony pathology to her left heel when compared with earlier xrays.  Clinically she has symptoms of acute plantar fascia tear.  Treated with unna boot and cam walker.  Told her to use crutches as needed. RTC 1 week.   Gardiner Barefoot DPM

## 2016-04-01 ENCOUNTER — Other Ambulatory Visit (HOSPITAL_COMMUNITY): Payer: 59

## 2016-04-01 ENCOUNTER — Ambulatory Visit (HOSPITAL_COMMUNITY): Payer: 59 | Admitting: Oncology

## 2016-04-01 ENCOUNTER — Encounter (HOSPITAL_COMMUNITY): Payer: Self-pay | Admitting: Oncology

## 2016-04-06 ENCOUNTER — Telehealth (HOSPITAL_COMMUNITY): Payer: Self-pay | Admitting: Emergency Medicine

## 2016-04-06 ENCOUNTER — Encounter (HOSPITAL_COMMUNITY): Payer: Self-pay | Admitting: Oncology

## 2016-04-06 NOTE — Telephone Encounter (Signed)
Called pt to let her know it was ok to to start taking her xeloda again.  She said she would start in the morning.

## 2016-04-08 ENCOUNTER — Ambulatory Visit (INDEPENDENT_AMBULATORY_CARE_PROVIDER_SITE_OTHER): Payer: 59

## 2016-04-08 ENCOUNTER — Encounter: Payer: Self-pay | Admitting: Podiatry

## 2016-04-08 ENCOUNTER — Ambulatory Visit (INDEPENDENT_AMBULATORY_CARE_PROVIDER_SITE_OTHER): Payer: 59 | Admitting: Podiatry

## 2016-04-08 ENCOUNTER — Telehealth (HOSPITAL_COMMUNITY): Payer: Self-pay | Admitting: Emergency Medicine

## 2016-04-08 ENCOUNTER — Other Ambulatory Visit (HOSPITAL_COMMUNITY): Payer: Self-pay | Admitting: Hematology & Oncology

## 2016-04-08 DIAGNOSIS — M79672 Pain in left foot: Secondary | ICD-10-CM

## 2016-04-08 DIAGNOSIS — M722 Plantar fascial fibromatosis: Secondary | ICD-10-CM

## 2016-04-08 DIAGNOSIS — C7951 Secondary malignant neoplasm of bone: Secondary | ICD-10-CM

## 2016-04-08 DIAGNOSIS — C50919 Malignant neoplasm of unspecified site of unspecified female breast: Secondary | ICD-10-CM

## 2016-04-08 LAB — MISC LABCORP TEST (SEND OUT): Labcorp test code: 511176

## 2016-04-08 NOTE — Telephone Encounter (Signed)
Prescription verification with [facility] for Xeloda.  2000mg  in am and 1500 mg in pm 98 tablets, with 1 refill, 7 days on and 7 days off.

## 2016-04-09 ENCOUNTER — Encounter (HOSPITAL_COMMUNITY): Payer: Self-pay | Admitting: Lab

## 2016-04-09 MED ORDER — MORPHINE SULFATE ER 30 MG PO TBCR: 30.0000 mg | EXTENDED_RELEASE_TABLET | Freq: Two times a day (BID) | ORAL | 0 refills | Status: DC

## 2016-04-09 NOTE — Progress Notes (Signed)
Subjective:     Patient ID: [name], female   DOB: 12/14/1965, 50 y.o.   MRN: [id number]  HPI patient presents stating that she is still having a lot of pain in her heel arch forefoot and that she did develop a blister from the boot that was put on   Review of Systems     Objective:   Physical Exam Patient has stage IV cancer and does get swelling and has discomfort with a probable tear of the plantar fascia secondary to an injury she sustained 2 weeks ago. The blister is healed but she only wore the Unna boot for several days and she is wearing a walking boot    Assessment:     Probable fascial tear left creating inflammation pain    Plan:     Reviewed x-ray and at this time continue air fracture walker usage stretching exercises and will be seen back to recheck. Patient will not have any further aggressive treatment and less symptoms were to get worse  X-ray report indicates no signs that this is a bone injury appears to be soft tissue

## 2016-04-15 ENCOUNTER — Other Ambulatory Visit (HOSPITAL_COMMUNITY): Payer: 59

## 2016-04-15 ENCOUNTER — Ambulatory Visit (HOSPITAL_COMMUNITY): Payer: 59

## 2016-04-15 ENCOUNTER — Encounter (HOSPITAL_COMMUNITY): Payer: 59 | Attending: Oncology | Admitting: Hematology & Oncology

## 2016-04-15 ENCOUNTER — Other Ambulatory Visit (HOSPITAL_COMMUNITY): Payer: Self-pay | Admitting: Pharmacist

## 2016-04-15 ENCOUNTER — Encounter (HOSPITAL_BASED_OUTPATIENT_CLINIC_OR_DEPARTMENT_OTHER): Payer: 59

## 2016-04-15 ENCOUNTER — Encounter (HOSPITAL_COMMUNITY): Payer: Self-pay | Admitting: Hematology & Oncology

## 2016-04-15 VITALS — BP 107/77 | HR 110 | Temp 97.9°F | Resp 16 | Wt 248.8 lb

## 2016-04-15 DIAGNOSIS — C50919 Malignant neoplasm of unspecified site of unspecified female breast: Secondary | ICD-10-CM

## 2016-04-15 DIAGNOSIS — C229 Malignant neoplasm of liver, not specified as primary or secondary: Secondary | ICD-10-CM | POA: Insufficient documentation

## 2016-04-15 DIAGNOSIS — C7951 Secondary malignant neoplasm of bone: Secondary | ICD-10-CM

## 2016-04-15 DIAGNOSIS — C787 Secondary malignant neoplasm of liver and intrahepatic bile duct: Secondary | ICD-10-CM | POA: Diagnosis not present

## 2016-04-15 DIAGNOSIS — Z5181 Encounter for therapeutic drug level monitoring: Secondary | ICD-10-CM

## 2016-04-15 DIAGNOSIS — I82C11 Acute embolism and thrombosis of right internal jugular vein: Secondary | ICD-10-CM

## 2016-04-15 DIAGNOSIS — C7931 Secondary malignant neoplasm of brain: Secondary | ICD-10-CM

## 2016-04-15 DIAGNOSIS — I82B11 Acute embolism and thrombosis of right subclavian vein: Secondary | ICD-10-CM

## 2016-04-15 DIAGNOSIS — R110 Nausea: Secondary | ICD-10-CM

## 2016-04-15 LAB — CBC WITH DIFFERENTIAL/PLATELET
Basophils Absolute: 0 10*3/uL (ref 0.0–0.1)
Basophils Relative: 0 %
Eosinophils Absolute: 0 10*3/uL (ref 0.0–0.7)
Eosinophils Relative: 0 %
HCT: 39.1 % (ref 36.0–46.0)
HEMOGLOBIN: 12.6 g/dL (ref 12.0–15.0)
LYMPHS ABS: 1.2 10*3/uL (ref 0.7–4.0)
LYMPHS PCT: 23 %
MCH: 29.8 pg (ref 26.0–34.0)
MCHC: 32.2 g/dL (ref 30.0–36.0)
MCV: 92.4 fL (ref 78.0–100.0)
MONOS PCT: 9 %
Monocytes Absolute: 0.5 10*3/uL (ref 0.1–1.0)
NEUTROS PCT: 68 %
Neutro Abs: 3.6 10*3/uL (ref 1.7–7.7)
Platelets: 252 10*3/uL (ref 150–400)
RBC: 4.23 MIL/uL (ref 3.87–5.11)
RDW: 17.7 % — ABNORMAL HIGH (ref 11.5–15.5)
WBC: 5.4 10*3/uL (ref 4.0–10.5)

## 2016-04-15 LAB — COMPREHENSIVE METABOLIC PANEL
ALK PHOS: 85 U/L (ref 38–126)
ALT: 17 U/L (ref 14–54)
ANION GAP: 10 (ref 5–15)
AST: 21 U/L (ref 15–41)
Albumin: 3.8 g/dL (ref 3.5–5.0)
BILIRUBIN TOTAL: 0.4 mg/dL (ref 0.3–1.2)
BUN: 10 mg/dL (ref 6–20)
CALCIUM: 9.5 mg/dL (ref 8.9–10.3)
CO2: 30 mmol/L (ref 22–32)
CREATININE: 0.69 mg/dL (ref 0.44–1.00)
Chloride: 98 mmol/L — ABNORMAL LOW (ref 101–111)
Glucose, Bld: 133 mg/dL — ABNORMAL HIGH (ref 65–99)
Potassium: 3.7 mmol/L (ref 3.5–5.1)
Sodium: 138 mmol/L (ref 135–145)
TOTAL PROTEIN: 7 g/dL (ref 6.5–8.1)

## 2016-04-15 MED ORDER — HEPARIN SOD (PORK) LOCK FLUSH 100 UNIT/ML IV SOLN
500.0000 [IU] | Freq: Once | INTRAVENOUS | Status: AC
  Administered 2016-04-15: 500 [IU] via INTRAVENOUS

## 2016-04-15 MED ORDER — ZOLEDRONIC ACID 4 MG/5ML IV CONC: 4.0000 mg | Freq: Once | INTRAVENOUS | Status: DC

## 2016-04-15 MED ORDER — ZOLEDRONIC ACID 4 MG/100ML IV SOLN
4.0000 mg | Freq: Once | INTRAVENOUS | Status: AC
  Administered 2016-04-15: 4 mg via INTRAVENOUS
  Filled 2016-04-15: qty 100

## 2016-04-15 MED ORDER — SODIUM CHLORIDE 0.9 % IV SOLN
INTRAVENOUS | Status: DC
  Administered 2016-04-15: 10:00:00 via INTRAVENOUS

## 2016-04-15 MED ORDER — SILVER SULFADIAZINE 1 % EX CREA
1.0000 | TOPICAL_CREAM | Freq: Every day | CUTANEOUS | 0 refills | Status: DC
Start: 2016-04-15 — End: 2017-02-17

## 2016-04-15 MED ORDER — SODIUM CHLORIDE 0.9% FLUSH
10.0000 mL | INTRAVENOUS | Status: DC | PRN
  Administered 2016-04-15: 10 mL via INTRAVENOUS
  Filled 2016-04-15: qty 10

## 2016-04-15 MED ORDER — HEPARIN SOD (PORK) LOCK FLUSH 100 UNIT/ML IV SOLN
INTRAVENOUS | Status: AC
  Filled 2016-04-15: qty 5

## 2016-04-15 NOTE — Progress Notes (Signed)
[facility] [place], MD [place] [address]    Breast cancer, stage 4 [facility])   04/25/2014 Initial Diagnosis    Breast cancer, stage 4      04/25/2014 Imaging    CT abdomen/pelvis with widespread metastatic disease to the liver, multiple lytic lesions throughout spine and pelvis. No FX or epidural tumor identified      04/26/2014 Imaging    CT head unremarkable      04/26/2014 Imaging    CT chest with no lung mass or pulmonary nodules, no adenopathy. Lytic bone lesions, right 2nd rib      04/27/2014 Initial Biopsy    U/S guided liver biopsy, lesion in anterior and inferior left hepatic lobe biopsied      04/27/2014 Pathology Results    Metastatic adenocarcinoma, CK7, ER+, patchy positivity with PR. Possible primary includes breast, less likely gynecologic      05/15/2014 Mammogram    BI-RADS CATEGORY  2: Benign Finding(s)      05/16/2014 PET scan    1. Intensely hypermetabolic hepatic metastasis. 2. Widespread hypermetabolic skeletal lesions. 3. No primary adenocarcinoma identified by FDG PET imaging.      05/19/2014 Imaging    MUGA- Left ventricular ejection fracture greater than 70%.      05/21/2014 Breast MRI    No suspicious masses or enhancement within the breasts. No axillary adenopathy.      05/22/2014 - 07/03/2014 Antibody Plan    Herceptin/Perjeta/Tamoxifen      06/12/2014 - 07/03/2014 Chemotherapy    Taxotere added secondary to persistent abdominal and back pain      06/17/2014 - 06/19/2014 Hospital Admission    Neutropenia, fever, diarrhea, nausea, vomiting      06/20/2014 - 07/10/2014 Radiation Therapy    Dr. Thea Silversmith 12 fractions to L3-S3 (30 Gy) and left scapula (20 Gy).       07/03/2014 Adverse Reaction    Perjeta- induced diarrhea.  Perjeta discontinued      07/16/2014 - 07/20/2014 Hospital Admission    Electrolyte abnormalities, and diarrhea.  Suspect Perjeta-induced diarrhea.   Negative GI work-up.      07/24/2014 - 08/19/2015 Chemotherapy    Herceptin/Tamoxifen/Xgeva      08/21/2014 Imaging    MUGA- Left ventricular ejection fraction equals 71%.      08/24/2014 PET scan    Dramatic reduction in metabolic activity of the widespread liver metastasis. Liver metastasis now have metabolic activity equal to background normal liver activity. Liver has a nodular contour. Marked reduction in metabolic activity of skeletal lesions..      10/05/2014 Progression    Widespread metastatic disease to the brain as described. Between 20 and 30 intracranial metastatic deposits are now seen. No midline shift or incipient herniation      10/09/2014 - 10/26/2014 Radiation Therapy    Whole Brain XRT      11/14/2014 Imaging    MUGA- LVEF 67%      02/13/2015 Imaging    MUGA- LVEF 59%      02/15/2015 Treatment Plan Change    Due to declining LVEF, will hold Herceptin per PI guidelines.      04/12/2015 -  Chemotherapy    Herceptin restarted      06/02/2015 - 06/08/2015 Hospital Admission    Pneumonia      07/05/2015 Progression     PET/CT concern for mild progression of skeletal metastasis with several lesions within the  spine and 1 lesion in the Left iliac wing with mild to moderate metabolic activity new from prior. Rising CA 27-29      07/16/2015 - 10/23/2015 Anti-estrogen oral therapy    Arimidex      07/16/2015 Imaging    MRI brain with satisfactory post treatment apperance of brain. interval resolved enhancing R caudate metastasis, minimal punctate residual enhancing metastatic disease at the inferior L cerebellum. No new metastatic disease or new intracranial abnormality      07/19/2015 Treatment Plan Change    Discontinue Tamoxifen, Zoladex plus Arimidex.       08/27/2015 Procedure    Laparoscopic bilateral salpingo-oophorectomy by Dr. Gaetano Net      10/17/2015 PET scan    Osseous metastatic disease appears slightly progressive based on a new right scapular lesion and  increased uptake within lesions in the thoracic spine, left iliac wing and  proximal right femur.      10/17/2015 Progression    Slight progression on PET scan imaging.      10/18/2015 Imaging    REsolved enhancing metastatic disease to the brain status post WBXRT      10/23/2015 - 02/12/2016 Adjuvant Chemotherapy    Faslodex loading followed by maintenance dose.  (Herceptin continued)      11/04/2015 Imaging    MUGA- LEFT ventricular ejection fraction 51% slightly decreased in a 57% on the previous exam.      12/24/2015 Treatment Plan Change    Zometa every 28 days.  Xgeva discontinued.        01/01/2016 Imaging    MUGA- Left ventricular ejection fraction equals 57.9%. This is increased from 51.1% previously.      02/03/2016 PET scan    1. Mixed metabolic changes in the scattered hypermetabolic sclerotic osseous metastases throughout the axial and proximal appendicular skeleton as detailed above. 2. No new sites of hypermetabolic metastatic disease. Stable pseudo-cirrhotic appearance of the liver due to treated liver metastases with no hypermetabolic liver metastases.       02/03/2016 Progression    PET shows mixed osseous response.  Some lesions more hypermetabolic, others improved.      02/05/2016 Treatment Plan Change    D/C Herceptin.  Faslodex as scheduled on 02/12/2016, then discontinued.  Continue Zometa.      02/05/2016 Treatment Plan Change    Prescriptions for Xeloda 7 days on and 7 days off and Tykerb printed and provided for authorization.      02/19/2016 -  Chemotherapy    Xeloda 2300 mg BID 7 days on and 7 days off and Tykerb       02/24/2016 Treatment Plan Change    Xeloda dose reduced by 10% to 2000 mg BID week on and week off.      03/18/2016 Treatment Plan Change    Xeloda dose reduced to 2000 mg in AM and 1500 mg in PM 7 days on and 7 days off.      03/23/2016 Imaging    MUGA- Normal LEFT ventricular ejection fraction of 56% not  significantly changed from 58% on previous exam.       INTERVAL HISTORY: [name] 50 y.o. female returns for follow-up of stage IV adenocarcinoma of the breast, ER+, HER 2 + disease.  [name] was here with her husband. She is wearing a boot on her left foot today.   She had a scan of her left foot on 03/31/16 because of foot pain. She has a torn fascia in her foot. She has  a broken 4th toe. She has a big blister on her instep from the boot. She says that usually the blister is wet, but she put medication on it last night so it's a bit dryer today. She has been keeping her foot covered. She sees Dr. Paulla Dolly for her foot.   She started her new medications on 02/19/16. She has been doing okay on these. She says her mouth is dry, she has some nausea, and she doesn't have much of an appetite. She has been taking Zofran for the nausea, but her insurance won't pay for the nausea patch. She says occasionally her hands are shaking. She says it is hard to put on her mascara. Every morning while drinking her coffee, some liquid drips out of her nose. She has been using a neti pot for this.   She has a cut on her finger that has not been getting any better.  Her vision has been changing every 6 months when she goes back to the doctor.   She is thirsty all of the time. Her skin is very dry. She has been using vasoline to help with this.   She has been wearing a bra without an under wire, that buttons up the middle and she says it feels much better.   Her lower back hurts after a lot of activity such as decorating the house for Christmas.  Realistically, she notes that if her foot was better she would be doing ok.   MEDICAL HISTORY: Past Medical History:  Diagnosis Date  . Anticoagulated    xarelto  . Anxiety   . Breast cancer metastasized to multiple sites [facility])    liver, brain, and bone  . Breast cancer, stage 4 [facility]) oncologist-  dr Larene Beach Konya Fauble ([facility])   dx 12/ 2015 --  breast cancer Stage 4,  ER/HER2 +,  w/  liver, brain and  bone mets/  chemotherapy and radiation therapy  . Chronic pain syndrome    secondary to cancer   . Depression   . Drug-induced cardiomyopathy ([place])    per last MUGA (08-08-2015), ef 56.5/ per last echo 05-18-2014 ef 60-65%  . Family history of prostate cancer   . GERD (gastroesophageal reflux disease)   . History of colon polyps    07-13-2013  benign  . History of DVT (deep vein thrombosis)    07-09-2014  upper right extremity-  RIJ and right subclavian--  resolved  . History of gastritis    erosive  . History of pneumonia    HCAP 06-07-2015--  resolved per cxr 07-04-2015  . History of radiation therapy    12 fractions to L3 - S3, 30Gy and left spacula 20Gy (06-20-2014 to 07-10-2014) //  whole brain rxt (10-09-2014 to 10-26-2014)  . History of small bowel obstruction    S/P RESECTION 2008  . Migraine   . PONV (postoperative nausea and vomiting)    pt states scope patch does well    has Hyperlipidemia; Depression; Breast cancer, stage 4 ([place]); Bone metastases ([place]); DVT (deep venous thrombosis) ([place]); GERD (gastroesophageal reflux disease); Family history of prostate cancer; Genetic testing; Brain metastases ([place]); Drug-induced cardiomyopathy ([place]); HCAP (healthcare-associated pneumonia); Pneumonia; Metastatic breast cancer ([place]); Esophageal reflux; and Mouth sores on her problem list.      has No Known Allergies.  Ms. Hogge had no medications administered during this visit.  SURGICAL HISTORY: Past Surgical History:  Procedure Laterality Date  . BREAST REDUCTION SURGERY  03/17/2011   Procedure: MAMMARY  REDUCTION BILATERAL (BREAST);  Surgeon: Mary A Contogiannis;  Location: [facility];  Service: Plastics;  Laterality: Bilateral;  . CATARACT EXTRACTION W/ INTRAOCULAR LENS  IMPLANT, BILATERAL  2008  . CERVICAL FUSION  2003   C5 -- C6  . COLONOSCOPY N/A 07/13/2013   Procedure: COLONOSCOPY;  Surgeon: Rogene Houston, MD;  [facility];  Service: Endoscopy;  Laterality: N/A;  930  . COLONOSCOPY N/A 11/26/2014   Procedure: COLONOSCOPY;  Surgeon: Rogene Houston, MD;  [facility];  Service: Endoscopy;  Laterality: N/A;  730  . D & C HYSTEROSCOPY/  RESECTION ENDOMETRIAL MASS/  [place] ENDOMETRIAL ABLATION  04-11-2010  . DX LAPAROSCOPY W/ PARTIAL SMALL BOWEL RESECTION AND APPENDECTOMY  04-13-2007  . ESOPHAGOGASTRODUODENOSCOPY N/A 05/25/2014   Procedure: ESOPHAGOGASTRODUODENOSCOPY (EGD);  Surgeon: Rogene Houston, MD;  [facility];  Service: Endoscopy;  Laterality: N/A;  155  . KNEE ARTHROSCOPY Right 2005  . LAPAROSCOPIC ASSISTED VAGINAL HYSTERECTOMY  10-13-2010   w/ Bx Left Fallopian tube and Aspiration Right Ovarian Cyst  . LAPAROSCOPIC CHOLECYSTECTOMY  11-17-2002  . LAPAROSCOPIC SALPINGO OOPHERECTOMY Bilateral 08/26/2015   Procedure: LAPAROSCOPIC SALPINGO OOPHORECTOMY, bilateral;  Surgeon: Everlene Farrier, MD;  Location: [facility];  Service: Gynecology;  Laterality: Bilateral;  . PORTACATH PLACEMENT  05-17-2014  . SHOULDER ARTHROSCOPY WITH ROTATOR CUFF REPAIR Right 2002  . TRANSTHORACIC ECHOCARDIOGRAM  05-18-2014   ef 60-65%//   last MUGA  (08-08-2015)  ef 56.6%      SOCIAL HISTORY: Social History   Social History  . Marital status: Married    Spouse name: N/A  . Number of children: N/A  . Years of education: N/A   Occupational History  . Not on file.   Social History Main Topics  . Smoking status: Former Smoker    Types: Cigarettes    Quit date: 08/20/1994  . Smokeless tobacco: Never Used  . Alcohol use Yes     Comment: Occasionally  . Drug use: No  . Sexual activity: Not on file   Other Topics Concern  . Not on file   Social History Narrative  . No narrative on file    FAMILY HISTORY: Family History  Problem Relation Age of Onset  . Diabetes Father   . Heart attack Maternal Grandmother 30    multiple over lifetime.  .  Cancer Maternal Grandmother 88    NOS  . Prostate cancer Maternal Grandfather     dx in his 5s  . Lung cancer Paternal Grandfather     dx <50  . Lymphoma Maternal Aunt     dx in her 23s  . Melanoma Cousin 62    maternal first cousin  . Brain cancer Cousin     paternal first cousin dx under 37  . Prostate cancer Other     MGF's father  . Colon cancer Other     MGM's mother  Her son lives in [place]. Her daughter lives in [place].  Review of Systems  Constitutional:       Lack of appetite.   HENT:       Leakage from nose.  Dry mouth. Very thirsty.  Eyes:       Vision changes every 6 months.   Respiratory: Negative.   Cardiovascular: Negative.   Gastrointestinal: Positive for nausea.  Genitourinary: Negative.   Musculoskeletal: Positive for back pain (after excess activity.).       Left foot in a boot. Broken 4th  toe and torn fascia.  Blister on instep from boot.   Skin:       Dry skin  Neurological:       Shaking in fingers.   Endo/Heme/Allergies: Negative.   Psychiatric/Behavioral: Negative.   All other systems reviewed and are negative. 14 point review of systems was performed and is negative except as detailed under history of present illness and above   PHYSICAL EXAMINATION  ECOG PERFORMANCE STATUS: 1 - Symptomatic but completely ambulatory   Vitals with BMI 04/15/2016  Height   Weight 248 lbs 13 oz  BMI   Systolic 128  Diastolic 77  Pulse 786  Respirations 16    Physical Exam  Constitutional: She is oriented to person, place, and time and well-developed, well-nourished, and in no distress.  Patient is wearing a boot on left foot.  Patient was able to get on exam table without assistance.  HENT:  Head: Normocephalic and atraumatic.  Mouth/Throat: No oropharyngeal exudate.  Eyes: EOM are normal. Pupils are equal, round, and reactive to light. No scleral icterus.  Neck: Normal range of motion. Neck supple.  Cardiovascular: Normal rate, regular  rhythm and normal heart sounds.   Pulmonary/Chest: Effort normal and breath sounds normal.  Abdominal: Soft. Bowel sounds are normal. She exhibits no distension and no mass. There is no tenderness. There is no rebound and no guarding.  Musculoskeletal: Normal range of motion. She exhibits no edema.  Lymphadenopathy:    She has no cervical adenopathy.  Neurological: She is alert and oriented to person, place, and time. No cranial nerve deficit. Gait normal.  Skin: Skin is warm and dry.  Blister on left dorsal foot, near ankle.   Psychiatric: Mood, memory, affect and judgment normal.  Nursing note and vitals reviewed.   LABORATORY DATA: I have reviewed the data as listed.  CBC    Component Value Date/Time   WBC 5.0 03/27/2016 0919   RBC 4.21 03/27/2016 0919   HGB 12.3 03/27/2016 0919   HCT 38.2 03/27/2016 0919   PLT 227 03/27/2016 0919   MCV 90.7 03/27/2016 0919   MCH 29.2 03/27/2016 0919   MCHC 32.2 03/27/2016 0919   RDW 16.7 (H) 03/27/2016 0919   LYMPHSABS 0.9 03/27/2016 0919   MONOABS 0.4 03/27/2016 0919   EOSABS 0.0 03/27/2016 0919   BASOSABS 0.0 03/27/2016 0919   CMP     Component Value Date/Time   NA 137 03/27/2016 0919   K 3.5 03/27/2016 0919   CL 98 (L) 03/27/2016 0919   CO2 32 03/27/2016 0919   GLUCOSE 108 (H) 03/27/2016 0919   BUN 11 03/27/2016 0919   CREATININE 0.76 03/27/2016 0919   CALCIUM 9.0 03/27/2016 0919   PROT 7.0 03/27/2016 0919   ALBUMIN 3.8 03/27/2016 0919   AST 19 03/27/2016 0919   ALT 19 03/27/2016 0919   ALKPHOS 99 03/27/2016 0919   BILITOT 0.5 03/27/2016 0919   GFRNONAA >60 03/27/2016 0919   GFRAA >60 03/27/2016 0919     RADIOLOGY: I have personally reviewed the radiological images as listed and agreed with the findings in the report. Study Result   AP  lateral and oblique left foot were taken.  No bony pathology left heel noted.  Healing fourth toe fracture.  Tibial sesamoid fracture noted.  No fracture dislocation or tumor noted.   Bone density appears normal for patients age and sex.  Joint space appears normal.  No frank deformity.   Gardiner Barefoot DPM    Study Result  CLINICAL DATA:  Metastatic breast cancer, on Herceptin therapy  EXAM: NUCLEAR MEDICINE CARDIAC BLOOD POOL IMAGING (MUGA)  TECHNIQUE: Cardiac multi-gated acquisition was performed at rest following intravenous injection of Tc-25mlabeled red blood cells.  RADIOPHARMACEUTICALS:  18.2 mCi Tc-922mertechnetate in-vitro labeled autologous red blood cells IV  COMPARISON:  01/01/2016  FINDINGS: LEFT ventricular ejection fraction is calculated at 56%, not significantly changed since a 58% on the previous exam.  Cine analysis in 3 projections demonstrates normal LEFT ventricular wall motion.  IMPRESSION: Normal LEFT ventricular ejection fraction of 56% not significantly changed from 58% on previous exam.  Normal LV wall motion.   Electronically Signed   By: MaLavonia Dana.D.   On: 03/23/2016 15:31     ASSESSMENT and THERAPY PLAN:  Stage IV ER positive, HER-2 positive carcinoma of the breast Bone metastases Widespread Brain Metastases Insomnia Anxiety UE DVT, RIJ and R subclavian Cancer related Fatigue Decline in EF, Herceptin held. Herceptin restarted on 04/12/2015 06/2014 progression of disease (bone only) with rise in tumor markers, abnormal PET Arimidex, herceptin, XGEVA neuropathy Tykerb/XELODA  Clinically Charlsey continues to do well. Overall she is tolerating Tykerb/Xeloda. She does have some nausea. Her insurance unfortunately will not pay for [place]. We did appeal but were not able to obtain this for her.  She will be due for repeat staging in January. She does not need any refills today. Her pharmacy has been giving her automatic refills.   I suggested using something like Aquaphor for her dry skin.   She will return for a follow up after her scans in January.   NCCN guidelines recommends the following for  monitoring of metastatic breast cancer:  A. Components of monitoring:   1. Monitoring includes periodic assessment of varied combinations of symptoms, physical examination, routine laboratory tests, imaging studies, and blood biomarkers where appropriate. Results of monitoring are classified as response/continued response to treatment, stable disease, uncertainty regarding disease status, or progression of disease. The clinician typically must assess and balance multiple different forms of information to make a determination regarding whether disease is being controlled and the toxicity of treatment is acceptable. Sometimes, this information may be contradictory.  B. Definition of disease progression:   1. Unequivocal evidence of progression of disease by one or more of these factors is required to establish progression of disease, either because of ineffective therapy or acquired resistance of disease to an applied therapy.  Progression of disease may be identified through evidence of growth or worsening of disease at previously known sites of disease and/or of the occurrence of new sites of metastatic disease.   2. Findings concerning for progression of disease include:    A. Worsening symptoms such as pain or dyspnea    B. Evidence of worsening or new disease on physical examination.    C. Declining performance status    D. Unexplained weight loss    E. Increasing Alkaline phosphatase, ALT, AST, or bilirubin    F. Hypercalcemia    G. New radiographic abnormality or increase in the size of pre-existing radiographic abnormality.    H. New areas of abnormality on functional imaging (eg, bone scan, PET/CT scan)    I. Increasing tumor markers (eg, CEA, CA 15-3, CA27.29)  All questions were answered. The patient knows to call the clinic with any problems, questions or concerns. We can certainly see the patient much sooner if necessary.   This note was signed electronically.  This document serves as  a record of services personally  performed by Ancil Linsey, MD. It was created on her behalf by Martinique Casey, a trained medical scribe. The creation of this record is based on the scribe's personal observations and the provider's statements to them. This document has been checked and approved by the attending provider.  I have reviewed the above documentation for accuracy and completeness, and I agree with the above.  Kelby Fam. Whitney Muse, MD

## 2016-04-15 NOTE — Patient Instructions (Addendum)
[place] at [facility] Discharge Instructions  RECOMMENDATIONS MADE BY THE CONSULTANT AND ANY TEST RESULTS WILL BE SENT TO YOUR REFERRING PHYSICIAN.  You saw Dr.Penland today. Silvadene was sent to your pharmacy. PET and MRI in January. Follow up with MD after scans. See Amy at checkout for appointments.  Thank you for choosing [place] at [facility] to provide your oncology and hematology care.  To afford each patient quality time with our provider, please arrive at least 15 minutes before your scheduled appointment time.   Beginning January 23rd 2017 lab work for the [facility] will be done in the  Main lab at [facility] on 1st floor. If you have a lab appointment with the [facility] please come in thru the  Main Entrance and check in at the main information desk  You need to re-schedule your appointment should you arrive 10 or more minutes late.  We strive to give you quality time with our providers, and arriving late affects you and other patients whose appointments are after yours.  Also, if you no show three or more times for appointments you may be dismissed from the clinic at the providers discretion.     Again, thank you for choosing [facility].  Our hope is that these requests will decrease the amount of time that you wait before being seen by our physicians.       _____________________________________________________________  Should you have questions after your visit to [facility], please contact our office at [phone] between the hours of 8:30 a.m. and 4:30 p.m.  Voicemails left after 4:30 p.m. will not be returned until the following business day.  For prescription refill requests, have your pharmacy contact our office.         Resources For Cancer Patients and their Caregivers ? American Cancer Society: Can assist with transportation, wigs, general needs, runs Look Good Feel  Better.        718-266-5533 ? Cancer Care: Provides financial assistance, online support groups, medication/co-pay assistance.  1-800-813-HOPE 405-733-6819) ? [place] Assists [place] cancer patients and their families through emotional , educational and financial support.  (323)055-6876 ? [place] DSS Where to apply for food stamps, Medicaid and utility assistance. 704 266 5891 ? [facility]: Transportation to medical appointments. 670 869 9438 ? Social Security Administration: May apply for disability if have a Stage IV cancer. (206)236-7516 807 278 4900 ? [employer], Disability and Transit Services: Assists with nutrition, care and transit needs. [place] Support Programs: @10RELATIVEDAYS @ > Cancer Support Group  2nd Tuesday of the month 1pm-2pm, Journey Room  > Creative Journey  3rd Tuesday of the month 1130am-1pm, Journey Room  > Look Good Feel Better  1st Wednesday of the month 10am-12 noon, Journey Room (Call [place] to register [phone])

## 2016-04-15 NOTE — Patient Instructions (Signed)
[place] at [facility] Discharge Instructions  RECOMMENDATIONS MADE BY THE CONSULTANT AND ANY TEST RESULTS WILL BE SENT TO YOUR REFERRING PHYSICIAN.  Received Zometa today. Follow-up as scheduled. Call clinic for any questions or concerns  Thank you for choosing [place] at [facility] to provide your oncology and hematology care.  To afford each patient quality time with our provider, please arrive at least 15 minutes before your scheduled appointment time.   Beginning January 23rd 2017 lab work for the [facility] will be done in the  Main lab at [facility] on 1st floor. If you have a lab appointment with the [facility] please come in thru the  Main Entrance and check in at the main information desk  You need to re-schedule your appointment should you arrive 10 or more minutes late.  We strive to give you quality time with our providers, and arriving late affects you and other patients whose appointments are after yours.  Also, if you no show three or more times for appointments you may be dismissed from the clinic at the providers discretion.     Again, thank you for choosing [facility].  Our hope is that these requests will decrease the amount of time that you wait before being seen by our physicians.       _____________________________________________________________  Should you have questions after your visit to [facility], please contact our office at [phone] between the hours of 8:30 a.m. and 4:30 p.m.  Voicemails left after 4:30 p.m. will not be returned until the following business day.  For prescription refill requests, have your pharmacy contact our office.         Resources For Cancer Patients and their Caregivers ? American Cancer Society: Can assist with transportation, wigs, general needs, runs Look Good Feel Better.        ([phone] ? Cancer Care: Provides financial  assistance, online support groups, medication/co-pay assistance.  1-800-813-HOPE 210 170 4806) ? [place] Assists [place] cancer patients and their families through emotional , educational and financial support.  3390668351 ? [place] DSS Where to apply for food stamps, Medicaid and utility assistance. 587-502-3249 ? RCATS: Transportation to medical appointments. 404 220 4080 ? Social Security Administration: May apply for disability if have a Stage IV cancer. 424 784 4051 856-298-9786 ? [employer], Disability and Transit Services: Assists with nutrition, care and transit needs. [place] Support Programs: @10RELATIVEDAYS @ > Cancer Support Group  2nd Tuesday of the month 1pm-2pm, Journey Room  > Creative Journey  3rd Tuesday of the month 1130am-1pm, Journey Room  > Look Good Feel Better  1st Wednesday of the month 10am-12 noon, Journey Room (Call [place] to register [phone])

## 2016-04-15 NOTE — Progress Notes (Signed)
[name] tolerated Zometa infusion well without complaints or incident. Labs reviewed prior to administering Zometa and pt denied any tooth,jaw or leg pain as well. VSS Pt discharged self ambulatory in satisfactory condition accompanied by husband

## 2016-04-16 LAB — CANCER ANTIGEN 27.29: CA 27.29: 30.3 U/mL (ref 0.0–38.6)

## 2016-04-16 LAB — CANCER ANTIGEN 15-3: CAN 15 3: 38.1 U/mL — AB (ref 0.0–25.0)

## 2016-04-16 MED ORDER — HEPARIN SOD (PORK) LOCK FLUSH 100 UNIT/ML IV SOLN
INTRAVENOUS | Status: AC
  Filled 2016-04-16: qty 5

## 2016-04-21 ENCOUNTER — Other Ambulatory Visit (HOSPITAL_COMMUNITY): Payer: Self-pay | Admitting: Oncology

## 2016-04-21 DIAGNOSIS — R5382 Chronic fatigue, unspecified: Secondary | ICD-10-CM

## 2016-04-21 DIAGNOSIS — C50919 Malignant neoplasm of unspecified site of unspecified female breast: Secondary | ICD-10-CM

## 2016-04-21 DIAGNOSIS — C229 Malignant neoplasm of liver, not specified as primary or secondary: Secondary | ICD-10-CM

## 2016-04-21 DIAGNOSIS — G4700 Insomnia, unspecified: Secondary | ICD-10-CM

## 2016-04-21 DIAGNOSIS — E876 Hypokalemia: Secondary | ICD-10-CM

## 2016-05-01 ENCOUNTER — Other Ambulatory Visit (HOSPITAL_COMMUNITY): Payer: Self-pay | Admitting: Hematology & Oncology

## 2016-05-01 ENCOUNTER — Other Ambulatory Visit (HOSPITAL_COMMUNITY): Payer: Self-pay | Admitting: Oncology

## 2016-05-01 DIAGNOSIS — C7951 Secondary malignant neoplasm of bone: Secondary | ICD-10-CM

## 2016-05-01 DIAGNOSIS — C50919 Malignant neoplasm of unspecified site of unspecified female breast: Secondary | ICD-10-CM

## 2016-05-01 MED ORDER — MORPHINE SULFATE ER 30 MG PO TBCR: 30.0000 mg | EXTENDED_RELEASE_TABLET | Freq: Two times a day (BID) | ORAL | 0 refills | Status: DC

## 2016-05-13 ENCOUNTER — Encounter (HOSPITAL_COMMUNITY): Payer: Self-pay

## 2016-05-13 ENCOUNTER — Encounter (HOSPITAL_COMMUNITY): Payer: 59

## 2016-05-13 ENCOUNTER — Encounter (HOSPITAL_COMMUNITY): Payer: 59 | Attending: Hematology & Oncology

## 2016-05-13 VITALS — BP 118/61 | HR 75 | Temp 98.1°F | Resp 18 | Wt 250.6 lb

## 2016-05-13 DIAGNOSIS — E785 Hyperlipidemia, unspecified: Secondary | ICD-10-CM | POA: Insufficient documentation

## 2016-05-13 DIAGNOSIS — C7951 Secondary malignant neoplasm of bone: Secondary | ICD-10-CM | POA: Diagnosis not present

## 2016-05-13 DIAGNOSIS — Z86718 Personal history of other venous thrombosis and embolism: Secondary | ICD-10-CM | POA: Insufficient documentation

## 2016-05-13 DIAGNOSIS — G4700 Insomnia, unspecified: Secondary | ICD-10-CM | POA: Insufficient documentation

## 2016-05-13 DIAGNOSIS — R530 Neoplastic (malignant) related fatigue: Secondary | ICD-10-CM | POA: Insufficient documentation

## 2016-05-13 DIAGNOSIS — Z87891 Personal history of nicotine dependence: Secondary | ICD-10-CM | POA: Insufficient documentation

## 2016-05-13 DIAGNOSIS — F419 Anxiety disorder, unspecified: Secondary | ICD-10-CM | POA: Diagnosis not present

## 2016-05-13 DIAGNOSIS — K219 Gastro-esophageal reflux disease without esophagitis: Secondary | ICD-10-CM | POA: Insufficient documentation

## 2016-05-13 DIAGNOSIS — C50919 Malignant neoplasm of unspecified site of unspecified female breast: Secondary | ICD-10-CM

## 2016-05-13 DIAGNOSIS — C7931 Secondary malignant neoplasm of brain: Secondary | ICD-10-CM | POA: Diagnosis not present

## 2016-05-13 DIAGNOSIS — Z86010 Personal history of colonic polyps: Secondary | ICD-10-CM | POA: Diagnosis not present

## 2016-05-13 DIAGNOSIS — Z9221 Personal history of antineoplastic chemotherapy: Secondary | ICD-10-CM | POA: Diagnosis not present

## 2016-05-13 DIAGNOSIS — Z833 Family history of diabetes mellitus: Secondary | ICD-10-CM | POA: Diagnosis not present

## 2016-05-13 DIAGNOSIS — G894 Chronic pain syndrome: Secondary | ICD-10-CM | POA: Diagnosis not present

## 2016-05-13 DIAGNOSIS — Z8042 Family history of malignant neoplasm of prostate: Secondary | ICD-10-CM | POA: Diagnosis not present

## 2016-05-13 DIAGNOSIS — Z923 Personal history of irradiation: Secondary | ICD-10-CM | POA: Insufficient documentation

## 2016-05-13 DIAGNOSIS — Z170 Estrogen receptor positive status [ER+]: Secondary | ICD-10-CM | POA: Insufficient documentation

## 2016-05-13 DIAGNOSIS — F329 Major depressive disorder, single episode, unspecified: Secondary | ICD-10-CM | POA: Diagnosis not present

## 2016-05-13 LAB — CBC WITH DIFFERENTIAL/PLATELET
BASOS ABS: 0 10*3/uL (ref 0.0–0.1)
BASOS PCT: 0 %
EOS ABS: 0 10*3/uL (ref 0.0–0.7)
Eosinophils Relative: 0 %
HCT: 38.2 % (ref 36.0–46.0)
Hemoglobin: 12.4 g/dL (ref 12.0–15.0)
Lymphocytes Relative: 17 %
Lymphs Abs: 1.1 10*3/uL (ref 0.7–4.0)
MCH: 30.4 pg (ref 26.0–34.0)
MCHC: 32.5 g/dL (ref 30.0–36.0)
MCV: 93.6 fL (ref 78.0–100.0)
MONO ABS: 0.6 10*3/uL (ref 0.1–1.0)
MONOS PCT: 8 %
NEUTROS ABS: 5 10*3/uL (ref 1.7–7.7)
Neutrophils Relative %: 75 %
PLATELETS: 232 10*3/uL (ref 150–400)
RBC: 4.08 MIL/uL (ref 3.87–5.11)
RDW: 18.1 % — AB (ref 11.5–15.5)
WBC: 6.7 10*3/uL (ref 4.0–10.5)

## 2016-05-13 LAB — COMPREHENSIVE METABOLIC PANEL
ALBUMIN: 3.9 g/dL (ref 3.5–5.0)
ALT: 16 U/L (ref 14–54)
ANION GAP: 8 (ref 5–15)
AST: 22 U/L (ref 15–41)
Alkaline Phosphatase: 92 U/L (ref 38–126)
BILIRUBIN TOTAL: 0.5 mg/dL (ref 0.3–1.2)
BUN: 9 mg/dL (ref 6–20)
CHLORIDE: 100 mmol/L — AB (ref 101–111)
CO2: 29 mmol/L (ref 22–32)
Calcium: 9.1 mg/dL (ref 8.9–10.3)
Creatinine, Ser: 0.8 mg/dL (ref 0.44–1.00)
GFR calc Af Amer: >60 mL/min (ref >60)
GFR calc non Af Amer: >60 mL/min (ref >60)
GLUCOSE: 120 mg/dL — AB (ref 65–99)
POTASSIUM: 3.4 mmol/L — AB (ref 3.5–5.1)
SODIUM: 137 mmol/L (ref 135–145)
TOTAL PROTEIN: 7.2 g/dL (ref 6.5–8.1)

## 2016-05-13 MED ORDER — ZOLEDRONIC ACID 4 MG/5ML IV CONC: 4.0000 mg | Freq: Once | INTRAVENOUS | Status: DC

## 2016-05-13 MED ORDER — SODIUM CHLORIDE 0.9 % IV SOLN
INTRAVENOUS | Status: DC
  Administered 2016-05-13: 12:00:00 via INTRAVENOUS

## 2016-05-13 MED ORDER — SODIUM CHLORIDE 0.9% FLUSH
10.0000 mL | INTRAVENOUS | Status: DC | PRN
  Administered 2016-05-13: 10 mL via INTRAVENOUS
  Filled 2016-05-13: qty 10

## 2016-05-13 MED ORDER — ZOLEDRONIC ACID 4 MG/100ML IV SOLN
4.0000 mg | Freq: Once | INTRAVENOUS | Status: AC
  Administered 2016-05-13: 4 mg via INTRAVENOUS
  Filled 2016-05-13: qty 100

## 2016-05-13 MED ORDER — HEPARIN SOD (PORK) LOCK FLUSH 100 UNIT/ML IV SOLN
500.0000 [IU] | Freq: Once | INTRAVENOUS | Status: AC
  Administered 2016-05-13: 500 [IU] via INTRAVENOUS
  Filled 2016-05-13: qty 5

## 2016-05-13 NOTE — Patient Instructions (Signed)
[facility] at [facility] Discharge Instructions  RECOMMENDATIONS MADE BY THE CONSULTANT AND ANY TEST RESULTS WILL BE SENT TO YOUR REFERRING PHYSICIAN.  Zometa given today. Follow up as scheduled.  Thank you for choosing [facility] at [facility] to provide your oncology and hematology care.  To afford each patient quality time with our provider, please arrive at least 15 minutes before your scheduled appointment time.    If you have a lab appointment with the Cancer Center please come in thru the  Main Entrance and check in at the main information desk  You need to re-schedule your appointment should you arrive 10 or more minutes late.  We strive to give you quality time with our providers, and arriving late affects you and other patients whose appointments are after yours.  Also, if you no show three or more times for appointments you may be dismissed from the clinic at the providers discretion.     Again, thank you for choosing [facility].  Our hope is that these requests will decrease the amount of time that you wait before being seen by our physicians.       _____________________________________________________________  Should you have questions after your visit to [facility], please contact our office at [phone] between the hours of 8:30 a.m. and 4:30 p.m.  Voicemails left after 4:30 p.m. will not be returned until the following business day.  For prescription refill requests, have your pharmacy contact our office.       Resources For Cancer Patients and their Caregivers ? American Cancer Society: Can assist with transportation, wigs, general needs, runs Look Good Feel Better.        1-888-227-6333 ? Cancer Care: Provides financial assistance, online support groups, medication/co-pay assistance.  1-800-813-HOPE (4673) ? [facility] Assists [place] cancer patients and  their families through emotional , educational and financial support.  336-427-4357 ? [place] DSS Where to apply for food stamps, Medicaid and utility assistance. 336-342-1394 ? RCATS: Transportation to medical appointments. 336-347-2287 ? Social Security Administration: May apply for disability if have a Stage IV cancer. 336-342-7796 1-800-772-1213 ? [place] Aging, Disability and Transit Services: Assists with nutrition, care and transit needs. 336-349-2343  Cancer Center Support Programs: @10RELATIVEDAYS@ > Cancer Support Group  2nd Tuesday of the month 1pm-2pm, Journey Room  > Creative Journey  3rd Tuesday of the month 1130am-1pm, Journey Room  > Look Good Feel Better  1st Wednesday of the month 10am-12 noon, Journey Room (Call American Cancer [employer] to register [phone])   

## 2016-05-13 NOTE — Progress Notes (Signed)
Zometa given today per orders. Patient stable and discharged ambulatory from clinic. Vitals stable, follow up as scheduled.

## 2016-05-17 ENCOUNTER — Other Ambulatory Visit (HOSPITAL_COMMUNITY): Payer: Self-pay | Admitting: Oncology

## 2016-05-17 DIAGNOSIS — C50919 Malignant neoplasm of unspecified site of unspecified female breast: Secondary | ICD-10-CM

## 2016-05-17 DIAGNOSIS — R5382 Chronic fatigue, unspecified: Secondary | ICD-10-CM

## 2016-05-17 DIAGNOSIS — C229 Malignant neoplasm of liver, not specified as primary or secondary: Secondary | ICD-10-CM

## 2016-05-18 ENCOUNTER — Other Ambulatory Visit (HOSPITAL_COMMUNITY): Payer: Self-pay | Admitting: Oncology

## 2016-05-18 ENCOUNTER — Telehealth (HOSPITAL_COMMUNITY): Payer: Self-pay | Admitting: Hematology & Oncology

## 2016-05-18 DIAGNOSIS — R5382 Chronic fatigue, unspecified: Secondary | ICD-10-CM

## 2016-05-18 DIAGNOSIS — C229 Malignant neoplasm of liver, not specified as primary or secondary: Secondary | ICD-10-CM

## 2016-05-18 DIAGNOSIS — C50919 Malignant neoplasm of unspecified site of unspecified female breast: Secondary | ICD-10-CM

## 2016-05-18 MED ORDER — METHYLPHENIDATE HCL 10 MG PO TABS: ORAL_TABLET | ORAL | 0 refills | Status: DC

## 2016-05-18 NOTE — Telephone Encounter (Signed)
PC FROM PT STATING HER COPAYS FOR XELODA AND TYKERB COMBINED WERE $ 6052.00. AND SHE WAS ASKED TO BRING $2000 FOR HER MRI SCHEDULED FOR 05/19/16. ADVISED HER THAT I WOULD SEEK COPAY ASSIST.  WAS ABLE TO GET A COPAY CARD FOR TYKERB BUT NOT XELODA. I WILL CONTACT THE PTS HUSBAND TO LET HIM KNOW

## 2016-05-19 ENCOUNTER — Telehealth (HOSPITAL_COMMUNITY): Payer: Self-pay | Admitting: *Deleted

## 2016-05-19 ENCOUNTER — Ambulatory Visit (HOSPITAL_COMMUNITY)
Admission: RE | Admit: 2016-05-19 | Discharge: 2016-05-19 | Disposition: A | Discharge status: Home / Self Care | Payer: 59 | Source: Ambulatory Visit | Attending: Hematology & Oncology | Admitting: Hematology & Oncology

## 2016-05-19 ENCOUNTER — Ambulatory Visit (HOSPITAL_COMMUNITY): Payer: 59

## 2016-05-19 DIAGNOSIS — C7951 Secondary malignant neoplasm of bone: Secondary | ICD-10-CM | POA: Diagnosis not present

## 2016-05-19 DIAGNOSIS — C50919 Malignant neoplasm of unspecified site of unspecified female breast: Secondary | ICD-10-CM | POA: Diagnosis present

## 2016-05-19 DIAGNOSIS — C7931 Secondary malignant neoplasm of brain: Secondary | ICD-10-CM | POA: Diagnosis not present

## 2016-05-19 MED ORDER — GADOBENATE DIMEGLUMINE 529 MG/ML IV SOLN
20.0000 mL | Freq: Once | INTRAVENOUS | Status: AC | PRN
  Administered 2016-05-19: 20 mL via INTRAVENOUS

## 2016-05-19 NOTE — Telephone Encounter (Signed)
Pt aware that Rx will be in front office for her to pick up.

## 2016-05-20 ENCOUNTER — Encounter (HOSPITAL_COMMUNITY)
Admission: RE | Admit: 2016-05-20 | Discharge: 2016-05-20 | Disposition: A | Discharge status: Home / Self Care | Payer: 59 | Source: Ambulatory Visit | Attending: Oncology | Admitting: Oncology

## 2016-05-20 DIAGNOSIS — Z79899 Other long term (current) drug therapy: Secondary | ICD-10-CM | POA: Diagnosis not present

## 2016-05-20 DIAGNOSIS — C7931 Secondary malignant neoplasm of brain: Secondary | ICD-10-CM

## 2016-05-20 DIAGNOSIS — C7951 Secondary malignant neoplasm of bone: Secondary | ICD-10-CM

## 2016-05-20 DIAGNOSIS — C50919 Malignant neoplasm of unspecified site of unspecified female breast: Secondary | ICD-10-CM | POA: Insufficient documentation

## 2016-05-20 LAB — GLUCOSE, CAPILLARY: Glucose-Capillary: 108 mg/dL — ABNORMAL HIGH (ref 65–99)

## 2016-05-20 MED ORDER — FLUDEOXYGLUCOSE F - 18 (FDG) INJECTION
12.5600 | Freq: Once | INTRAVENOUS | Status: AC | PRN
  Administered 2016-05-20: 12.56 via INTRAVENOUS

## 2016-05-22 ENCOUNTER — Encounter (HOSPITAL_BASED_OUTPATIENT_CLINIC_OR_DEPARTMENT_OTHER): Payer: 59 | Admitting: Hematology & Oncology

## 2016-05-22 ENCOUNTER — Encounter (HOSPITAL_COMMUNITY): Payer: Self-pay | Admitting: Hematology & Oncology

## 2016-05-22 ENCOUNTER — Ambulatory Visit (HOSPITAL_COMMUNITY): Payer: 59

## 2016-05-22 VITALS — BP 134/94 | HR 108 | Temp 97.9°F | Resp 18 | Wt 250.7 lb

## 2016-05-22 DIAGNOSIS — I82C11 Acute embolism and thrombosis of right internal jugular vein: Secondary | ICD-10-CM

## 2016-05-22 DIAGNOSIS — C50919 Malignant neoplasm of unspecified site of unspecified female breast: Secondary | ICD-10-CM

## 2016-05-22 DIAGNOSIS — C7931 Secondary malignant neoplasm of brain: Secondary | ICD-10-CM | POA: Diagnosis not present

## 2016-05-22 DIAGNOSIS — C7951 Secondary malignant neoplasm of bone: Secondary | ICD-10-CM

## 2016-05-22 DIAGNOSIS — C787 Secondary malignant neoplasm of liver and intrahepatic bile duct: Secondary | ICD-10-CM

## 2016-05-22 DIAGNOSIS — I82B11 Acute embolism and thrombosis of right subclavian vein: Secondary | ICD-10-CM

## 2016-05-22 DIAGNOSIS — K1370 Unspecified lesions of oral mucosa: Secondary | ICD-10-CM

## 2016-05-22 DIAGNOSIS — R110 Nausea: Secondary | ICD-10-CM

## 2016-05-22 MED ORDER — LEVOFLOXACIN 500 MG PO TABS: 500.0000 mg | ORAL_TABLET | Freq: Every day | ORAL | 0 refills | Status: DC

## 2016-05-22 NOTE — Progress Notes (Signed)
Nutrition Assessment   Reason for Assessment:   Patient referred for poor po intake, sore mouth while in clinic this am  ASSESSMENT:  51 year old female with breast cancer with metastatic disease. Past medical history of GERD, HLD  Patient reports morning are worse for her due to fatigue, nausea when smells food cooking, taste changes (especially metallic taste) and sore mouth.  Husband reports patient able to drink carnation instant breakfast usually in the am but that is about it.  Evenings are better with patient able to eat for example last night salad and loaded baked potato.   Patient reports that she is not taking nausea medication.   Nutrition Focused Physical Exam: deferred  Medications: calcium/Vit D, lasix, zofran, protonix, phenergan  Labs: reviewed  Anthropometrics:   Height: 68 inches Weight: 250 lb Stable weight 250-248 lb noted BMI: 38   Estimated Energy Needs  Kcals: 2000-2400 kcals/d Protein: 113 g/d Fluid: 2.4 L/d  NUTRITION DIAGNOSIS: Inadequate oral intake related to cancer related treatments as evidenced by limited intake of 1 good meal per day and protein shake    INTERVENTION:   Encouraged patient to take nausea medication prior to meals to prevent nausea Discussed ways to add calories and protein, strategies to overcome taste and smell changes and suggestions regarding sore mouth.   Handouts provided along with recipes.  Patient given samples of boost drinks to try Encouraged continued use of oral nutrition supplements and ways to increase calories of supplements as well.      MONITORING, EVALUATION, GOAL: Patient will consume adequate calories to maintain lean muscle mass   NEXT VISIT: Will plan phone call follow-up on Feb 9th  Deshauna Cayson B. Zenia Resides, [place], [place] (pager)

## 2016-05-22 NOTE — Patient Instructions (Signed)
[name] at [facility] Discharge Instructions  RECOMMENDATIONS MADE BY THE CONSULTANT AND ANY TEST RESULTS WILL BE SENT TO YOUR REFERRING PHYSICIAN.  You were seen today by Dr. Leanne Chang - restart Xeloda on the 19th. (1500mg  twice daily 7 days on 7 days off) If your symptoms/side effects continue we will have to decrease your Tykerb You have to let us know. Continue Zometa Follow up in 1 month with labwork.  Thank you for choosing [place] at [facility] to provide your oncology and hematology care.  To afford each patient quality time with our provider, please arrive at least 15 minutes before your scheduled appointment time.    If you have a lab appointment with the [facility] please come in thru the  Main Entrance and check in at the main information desk  You need to re-schedule your appointment should you arrive 10 or more minutes late.  We strive to give you quality time with our providers, and arriving late affects you and other patients whose appointments are after yours.  Also, if you no show three or more times for appointments you may be dismissed from the clinic at the providers discretion.     Again, thank you for choosing [facility].  Our hope is that these requests will decrease the amount of time that you wait before being seen by our physicians.       _____________________________________________________________  Should you have questions after your visit to [facility], please contact our office at [phone] between the hours of 8:30 a.m. and 4:30 p.m.  Voicemails left after 4:30 p.m. will not be returned until the following business day.  For prescription refill requests, have your pharmacy contact our office.       Resources For Cancer Patients and their Caregivers ? American Cancer Society: Can assist with transportation, wigs, general needs, runs Look Good Feel Better.         478-278-4047 ? Cancer Care: Provides financial assistance, online support groups, medication/co-pay assistance.  1-800-813-HOPE 862 099 1260) ? [place] Assists [place] cancer patients and their families through emotional , educational and financial support.  432-847-0134 ? [place] DSS Where to apply for food stamps, Medicaid and utility assistance. (269)513-5182 ? RCATS: Transportation to medical appointments. (365)087-6515 ? Social Security Administration: May apply for disability if have a Stage IV cancer. [phone] [phone]) [phone] ? [employer], Disability and Transit Services: Assists with nutrition, care and transit needs. [place] Support Programs: @10RELATIVEDAYS @ > Cancer Support Group  2nd Tuesday of the month 1pm-2pm, Journey Room  > Creative Journey  3rd Tuesday of the month 1130am-1pm, Journey Room  > Look Good Feel Better  1st Wednesday of the month 10am-12 noon, Journey Room (Call [place] to register [phone])

## 2016-05-22 NOTE — Progress Notes (Signed)
[facility] [place], [place] [address]    Breast cancer, stage 4 [facility])   04/25/2014 Initial Diagnosis    Breast cancer, stage 4      04/25/2014 Imaging    CT abdomen/pelvis with widespread metastatic disease to the liver, multiple lytic lesions throughout spine and pelvis. No FX or epidural tumor identified      04/26/2014 Imaging    CT head unremarkable      04/26/2014 Imaging    CT chest with no lung mass or pulmonary nodules, no adenopathy. Lytic bone lesions, right 2nd rib      04/27/2014 Initial Biopsy    U/S guided liver biopsy, lesion in anterior and inferior left hepatic lobe biopsied      04/27/2014 Pathology Results    Metastatic adenocarcinoma, CK7, ER+, patchy positivity with PR. Possible primary includes breast, less likely gynecologic      05/15/2014 Mammogram    BI-RADS CATEGORY  2: Benign Finding(s)      05/16/2014 PET scan    1. Intensely hypermetabolic hepatic metastasis. 2. Widespread hypermetabolic skeletal lesions. 3. No primary adenocarcinoma identified by FDG PET imaging.      05/19/2014 Imaging    MUGA- Left ventricular ejection fracture greater than 70%.      05/21/2014 Breast MRI    No suspicious masses or enhancement within the breasts. No axillary adenopathy.      05/22/2014 - 07/03/2014 Antibody Plan    Herceptin/Perjeta/Tamoxifen      06/12/2014 - 07/03/2014 Chemotherapy    Taxotere added secondary to persistent abdominal and back pain      06/17/2014 - 06/19/2014 Hospital Admission    Neutropenia, fever, diarrhea, nausea, vomiting      06/20/2014 - 07/10/2014 Radiation Therapy    Dr. Thea Silversmith 12 fractions to L3-S3 (30 Gy) and left scapula (20 Gy).       07/03/2014 Adverse Reaction    Perjeta- induced diarrhea.  Perjeta discontinued      07/16/2014 - 07/20/2014 Hospital Admission    Electrolyte abnormalities, and diarrhea.  Suspect Perjeta-induced diarrhea.   Negative GI work-up.      07/24/2014 - 08/19/2015 Chemotherapy    Herceptin/Tamoxifen/Xgeva      08/21/2014 Imaging    MUGA- Left ventricular ejection fraction equals 71%.      08/24/2014 PET scan    Dramatic reduction in metabolic activity of the widespread liver metastasis. Liver metastasis now have metabolic activity equal to background normal liver activity. Liver has a nodular contour. Marked reduction in metabolic activity of skeletal lesions..      10/05/2014 Progression    Widespread metastatic disease to the brain as described. Between 20 and 30 intracranial metastatic deposits are now seen. No midline shift or incipient herniation      10/09/2014 - 10/26/2014 Radiation Therapy    Whole Brain XRT      11/14/2014 Imaging    MUGA- LVEF 67%      02/13/2015 Imaging    MUGA- LVEF 59%      02/15/2015 Treatment Plan Change    Due to declining LVEF, will hold Herceptin per PI guidelines.      04/12/2015 -  Chemotherapy    Herceptin restarted      06/02/2015 - 06/08/2015 Hospital Admission    Pneumonia      07/05/2015 Progression     PET/CT concern for mild progression of skeletal metastasis with several lesions within the  spine and 1 lesion in the Left iliac wing with mild to moderate metabolic activity new from prior. Rising CA 27-29      07/16/2015 - 10/23/2015 Anti-estrogen oral therapy    Arimidex      07/16/2015 Imaging    MRI brain with satisfactory post treatment apperance of brain. interval resolved enhancing R caudate metastasis, minimal punctate residual enhancing metastatic disease at the inferior L cerebellum. No new metastatic disease or new intracranial abnormality      07/19/2015 Treatment Plan Change    Discontinue Tamoxifen, Zoladex plus Arimidex.       08/27/2015 Procedure    Laparoscopic bilateral salpingo-oophorectomy by Dr. Gaetano Net      10/17/2015 PET scan    Osseous metastatic disease appears slightly progressive based on a new right scapular lesion and  increased uptake within lesions in the thoracic spine, left iliac wing and  proximal right femur.      10/17/2015 Progression    Slight progression on PET scan imaging.      10/18/2015 Imaging    REsolved enhancing metastatic disease to the brain status post WBXRT      10/23/2015 - 02/12/2016 Adjuvant Chemotherapy    Faslodex loading followed by maintenance dose.  (Herceptin continued)      11/04/2015 Imaging    MUGA- LEFT ventricular ejection fraction 51% slightly decreased in a 57% on the previous exam.      12/24/2015 Treatment Plan Change    Zometa every 28 days.  Xgeva discontinued.        01/01/2016 Imaging    MUGA- Left ventricular ejection fraction equals 57.9%. This is increased from 51.1% previously.      02/03/2016 PET scan    1. Mixed metabolic changes in the scattered hypermetabolic sclerotic osseous metastases throughout the axial and proximal appendicular skeleton as detailed above. 2. No new sites of hypermetabolic metastatic disease. Stable pseudo-cirrhotic appearance of the liver due to treated liver metastases with no hypermetabolic liver metastases.       02/03/2016 Progression    PET shows mixed osseous response.  Some lesions more hypermetabolic, others improved.      02/05/2016 Treatment Plan Change    D/C Herceptin.  Faslodex as scheduled on 02/12/2016, then discontinued.  Continue Zometa.      02/05/2016 Treatment Plan Change    Prescriptions for Xeloda 7 days on and 7 days off and Tykerb printed and provided for authorization.      02/19/2016 -  Chemotherapy    Xeloda 2300 mg BID 7 days on and 7 days off and Tykerb       02/24/2016 Treatment Plan Change    Xeloda dose reduced by 10% to 2000 mg BID week on and week off.      03/18/2016 Treatment Plan Change    Xeloda dose reduced to 2000 mg in AM and 1500 mg in PM 7 days on and 7 days off.      03/23/2016 Imaging    MUGA- Normal LEFT ventricular ejection fraction of 56% not  significantly changed from 58% on previous exam.      05/20/2016 PET scan    1. The multiple osseous metastatic lesions shown to be hypermetabolic on the prior exam are reduced in activity or even resolved in hypermetabolic activity compared to prior, as detailed above. There are also numerous sclerotic bony lesions wedge are not currently and were not previously hypermetabolic, compatible with old non active lesions. 2. No findings of extra osseous metastatic disease currently. 3.  Hepatic pseudocirrhosis. 4. Healing rib fractures. Chronic pathologic fracture the left acromion.       CURRENT THERAPY: XELODA/Tykerb/Zometa  INTERVAL HISTORY: [name] 51 y.o. female returns for follow-up of stage IV adenocarcinoma of the breast, ER+, HER 2 + disease. History of brain metastases treated with WBRT.  Mrs. Goodall was here with her husband. She is feeling well today.Imaging is reviewed with the patient -- it looks excellent.   She gets mouth sores every time she takes Xeloda. Her lips are very dry and crack. It makes it painful to eat. She uses magic mouth wash for it. They only go away when she is off of the Xeloda. She notes that the sores are not ulcers, but more that her mouth feels sore. She has had dose reductions with her XELODA already.    She has no appetite, so she doesn't always take her Xeloda with food. When she doesn't take it with food, she occasionally vomits. Weight is stable.   She has had some drynesson her feet. She has a blister on her right thumb, below the nail.   This past week she has had some pain on the right side of her abdomen. She has an upcoming trip planned to [facility]. 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 She continues to be active.    MEDICAL HISTORY: Past Medical History:  Diagnosis Date  . Anticoagulated    xarelto  . Anxiety   . Breast cancer metastasized to multiple sites [facility])    liver, brain, and bone  . Breast cancer,  stage 4 [facility]) oncologist-  dr Larene Beach Jaedin Trumbo ([facility])   dx 12/ 2015 -- breast cancer Stage 4,  ER/HER2 +,  w/  liver, brain and  bone mets/  chemotherapy and radiation therapy  . Chronic pain syndrome    secondary to cancer   . Depression   . Drug-induced cardiomyopathy ([place])    per last MUGA (08-08-2015), ef 56.5/ per last echo 05-18-2014 ef 60-65%  . Family history of prostate cancer   . GERD (gastroesophageal reflux disease)   . History of colon polyps    07-13-2013  benign  . History of DVT (deep vein thrombosis)    07-09-2014  upper right extremity-  RIJ and right subclavian--  resolved  . History of gastritis    erosive  . History of pneumonia    HCAP 06-07-2015--  resolved per cxr 07-04-2015  . History of radiation therapy    12 fractions to L3 - S3, 30Gy and left spacula 20Gy (06-20-2014 to 07-10-2014) //  whole brain rxt (10-09-2014 to 10-26-2014)  . History of small bowel obstruction    S/P RESECTION 2008  . Migraine   . PONV (postoperative nausea and vomiting)    pt states scope patch does well    has Hyperlipidemia; Depression; Breast cancer, stage 4 ([place]); Bone metastases ([place]); DVT (deep venous thrombosis) ([place]); GERD (gastroesophageal reflux disease); Family history of prostate cancer; Genetic testing; Brain metastases ([place]); Drug-induced cardiomyopathy ([place]); HCAP (healthcare-associated pneumonia); Pneumonia; Metastatic breast cancer ([place]); Esophageal reflux; and Mouth sores on her problem list.      has No Known Allergies.  Ms. Rasool had no medications administered during this visit.  SURGICAL HISTORY: Past Surgical History:  Procedure Laterality Date  . BREAST REDUCTION SURGERY  03/17/2011   Procedure: MAMMARY REDUCTION BILATERAL (BREAST);  Surgeon: Mary A Contogiannis;  Location: [place];  Service: Plastics;  Laterality: Bilateral;  . CATARACT EXTRACTION  W/ INTRAOCULAR LENS  IMPLANT, BILATERAL  2008  . CERVICAL FUSION  2003   C5  -- C6  . COLONOSCOPY N/A 07/13/2013   Procedure: COLONOSCOPY;  Surgeon: Rogene Houston, MD;  [facility];  Service: Endoscopy;  Laterality: N/A;  930  . COLONOSCOPY N/A 11/26/2014   Procedure: COLONOSCOPY;  Surgeon: Rogene Houston, MD;  [facility];  Service: Endoscopy;  Laterality: N/A;  730  . D & C HYSTEROSCOPY/  RESECTION ENDOMETRIAL MASS/  [place] ENDOMETRIAL ABLATION  04-11-2010  . DX LAPAROSCOPY W/ PARTIAL SMALL BOWEL RESECTION AND APPENDECTOMY  04-13-2007  . ESOPHAGOGASTRODUODENOSCOPY N/A 05/25/2014   Procedure: ESOPHAGOGASTRODUODENOSCOPY (EGD);  Surgeon: Rogene Houston, MD;  [facility];  Service: Endoscopy;  Laterality: N/A;  155  . KNEE ARTHROSCOPY Right 2005  . LAPAROSCOPIC ASSISTED VAGINAL HYSTERECTOMY  10-13-2010   w/ Bx Left Fallopian tube and Aspiration Right Ovarian Cyst  . LAPAROSCOPIC CHOLECYSTECTOMY  11-17-2002  . LAPAROSCOPIC SALPINGO OOPHERECTOMY Bilateral 08/26/2015   Procedure: LAPAROSCOPIC SALPINGO OOPHORECTOMY, bilateral;  Surgeon: Everlene Farrier, MD;  Location: [place];  Service: Gynecology;  Laterality: Bilateral;  . PORTACATH PLACEMENT  05-17-2014  . SHOULDER ARTHROSCOPY WITH ROTATOR CUFF REPAIR Right 2002  . TRANSTHORACIC ECHOCARDIOGRAM  05-18-2014   ef 60-65%//   last MUGA  (08-08-2015)  ef 56.6%      SOCIAL HISTORY: Social History   Social History  . Marital status: Married    Spouse name: N/A  . Number of children: N/A  . Years of education: N/A   Occupational History  . Not on file.   Social History Main Topics  . Smoking status: Former Smoker    Types: Cigarettes    Quit date: 08/20/1994  . Smokeless tobacco: Never Used  . Alcohol use Yes     Comment: Occasionally  . Drug use: No  . Sexual activity: Not on file   Other Topics Concern  . Not on file   Social History Narrative  . No narrative on file    FAMILY HISTORY: Family History  Problem Relation Age of Onset  . Diabetes  Father   . Heart attack Maternal Grandmother 30    multiple over lifetime.  . Cancer Maternal Grandmother 57    NOS  . Prostate cancer Maternal Grandfather     dx in his 18s  . Lung cancer Paternal Grandfather     dx <50  . Lymphoma Maternal Aunt     dx in her 48s  . Melanoma Cousin 32    maternal first cousin  . Brain cancer Cousin     paternal first cousin dx under 72  . Prostate cancer Other     MGF's father  . Colon cancer Other     MGM's mother  Her son lives in [place]. Her daughter lives in [place].  Review of Systems  Constitutional:       No appetite.  Right thumb nail blister  HENT: Negative.        Mouth sores Dry, cracking lips  Eyes: Negative.   Respiratory: Negative.   Cardiovascular: Negative.   Gastrointestinal: Positive for abdominal pain and vomiting (secondary to Xeloda without food).  Genitourinary: Negative.   Musculoskeletal: Negative.   Skin:       Dryness  on feet  Neurological: Negative.   Endo/Heme/Allergies: Negative.   Psychiatric/Behavioral: Negative.   All other systems reviewed and are negative. 14 point review of systems was performed and is negative  except as detailed under history of present illness and above   PHYSICAL EXAMINATION  ECOG PERFORMANCE STATUS: 1 - Symptomatic but completely ambulatory  Vitals with BMI 05/22/2016  Height   Weight 250 lbs 11 oz  BMI   Systolic 195  Diastolic 94  Pulse 093  Respirations 18   Physical Exam  Constitutional: She is oriented to person, place, and time and well-developed, well-nourished, and in no distress.  Patient was able to get on exam table without assistance  HENT:  Head: Normocephalic and atraumatic.  Mouth/Throat: No oropharyngeal exudate.  Eyes: EOM are normal. Pupils are equal, round, and reactive to light. Right eye exhibits no discharge. Left eye exhibits no discharge. No scleral icterus.  Neck: Normal range of motion. Neck supple. No thyromegaly present.   Cardiovascular: Normal rate, regular rhythm and normal heart sounds.  Exam reveals no gallop and no friction rub.   No murmur heard. Pulmonary/Chest: Effort normal and breath sounds normal. No respiratory distress. She has no wheezes. She has no rales. She exhibits no tenderness.    Abdominal: Soft. Bowel sounds are normal. She exhibits no distension and no mass. There is no tenderness. There is no rebound and no guarding.  Musculoskeletal: Normal range of motion. She exhibits no edema or tenderness.  Lymphadenopathy:    She has no cervical adenopathy.  Neurological: She is alert and oriented to person, place, and time. She displays normal reflexes. No cranial nerve deficit. Gait normal. Coordination normal.  Skin: Skin is warm and dry.  Psychiatric: Mood, memory, affect and judgment normal.  Nursing note and vitals reviewed.   LABORATORY DATA: I have reviewed the data as listed. Results for TANZIE, ROTHSCHILD (MRN [id number]) as of 05/22/2016 08:34  Ref. Range 05/13/2016 10:45  COMPREHENSIVE METABOLIC PANEL Unknown Rpt (A)  Sodium Latest Ref Range: 135 - 145 mmol/L 137  Potassium Latest Ref Range: 3.5 - 5.1 mmol/L 3.4 (L)  Chloride Latest Ref Range: 101 - 111 mmol/L 100 (L)  CO2 Latest Ref Range: 22 - 32 mmol/L 29  Glucose Latest Ref Range: 65 - 99 mg/dL 120 (H)  BUN Latest Ref Range: 6 - 20 mg/dL 9  Creatinine Latest Ref Range: 0.44 - 1.00 mg/dL 0.80  Calcium Latest Ref Range: 8.9 - 10.3 mg/dL 9.1  Anion gap Latest Ref Range: 5 - 15  8  Alkaline Phosphatase Latest Ref Range: 38 - 126 U/L 92  Albumin Latest Ref Range: 3.5 - 5.0 g/dL 3.9  AST Latest Ref Range: 15 - 41 U/L 22  ALT Latest Ref Range: 14 - 54 U/L 16  Total Protein Latest Ref Range: 6.5 - 8.1 g/dL 7.2  Total Bilirubin Latest Ref Range: 0.3 - 1.2 mg/dL 0.5  EGFR (African American) Latest Ref Range: >60 mL/min >60  EGFR (Non-African Amer.) Latest Ref Range: >60 mL/min >60  WBC Latest Ref Range: 4.0 - 10.5 K/uL 6.7  RBC  Latest Ref Range: 3.87 - 5.11 MIL/uL 4.08  Hemoglobin Latest Ref Range: 12.0 - 15.0 g/dL 12.4  HCT Latest Ref Range: 36.0 - 46.0 % 38.2  MCV Latest Ref Range: 78.0 - 100.0 fL 93.6  MCH Latest Ref Range: 26.0 - 34.0 pg 30.4  MCHC Latest Ref Range: 30.0 - 36.0 g/dL 32.5  RDW Latest Ref Range: 11.5 - 15.5 % 18.1 (H)  Platelets Latest Ref Range: 150 - 400 K/uL 232  Neutrophils Latest Units: % 75  Lymphocytes Latest Units: % 17  Monocytes Relative Latest Units: % 8  Eosinophil Latest  Units: % 0  Basophil Latest Units: % 0  NEUT# Latest Ref Range: 1.7 - 7.7 K/uL 5.0  Lymphocyte # Latest Ref Range: 0.7 - 4.0 K/uL 1.1  Monocyte # Latest Ref Range: 0.1 - 1.0 K/uL 0.6  Eosinophils Absolute Latest Ref Range: 0.0 - 0.7 K/uL 0.0  Basophils Absolute Latest Ref Range: 0.0 - 0.1 K/uL 0.0    RADIOLOGY: I have personally reviewed the radiological images as listed and agreed with the findings in the report. Study Result   CLINICAL DATA:  Subsequent treatment strategy for metastatic breast cancer.  EXAM: NUCLEAR MEDICINE PET SKULL BASE TO THIGH  TECHNIQUE: 12.6 mCi F-18 FDG was injected intravenously. Full-ring PET imaging was performed from the skull base to thigh after the radiotracer. CT data was obtained and used for attenuation correction and anatomic localization.  FASTING BLOOD GLUCOSE:  Value: 108 mg/dl  COMPARISON:  02/03/2016  FINDINGS: NECK  No hypermetabolic lymph nodes in the neck.  CHEST  No hypermetabolic mediastinal or hilar nodes. No suspicious pulmonary nodules on the CT scan.  ABDOMEN/PELVIS  No abnormal hypermetabolic activity within the liver, pancreas, adrenal glands, or spleen. No hypermetabolic lymph nodes in the abdomen or pelvis.  Cholecystectomy. Lobular liver margin compatible with pseudocirrhosis. Postoperative findings in the right lower quadrant small bowel.  SKELETON  Numerous scattered sclerotic bony lesions are not  hypermetabolic. Various previously hypermetabolic lesions are significantly reduced in activity. For example, the right scapular glenoid neck metastatic lesion which previously had a maximum standard uptake value of 12.4 currently measures with maximum standard uptake value 4.5. Previous hypermetabolic lesions in the left T10 pedicle and right T8 vertebral body are no longer appreciably hypermetabolic. The a left iliac bone lesion currently has maximum standard uptake value 9.0, previously 12.6. Right femoral head lesion previously had a maximum SUV of 11.1, currently 5.3 no new bony metastatic lesions.  Healing fractures of the right fifth and sixth ribs, and of the left tenth rib. Chronic pathologic fracture of the left acromion.  IMPRESSION: 1. The multiple osseous metastatic lesions shown to be hypermetabolic on the prior exam are reduced in activity or even resolved in hypermetabolic activity compared to prior, as detailed above. There are also numerous sclerotic bony lesions wedge are not currently and were not previously hypermetabolic, compatible with old non active lesions. 2. No findings of extra osseous metastatic disease currently. 3. Hepatic pseudocirrhosis. 4. Healing rib fractures. Chronic pathologic fracture the left acromion.   Electronically Signed   By: Van Clines M.D.   On: 05/20/2016 11:25    Study Result   CLINICAL DATA:  51 y/o  F; history of metastatic breast cancer.  EXAM: MRI HEAD WITHOUT AND WITH CONTRAST  TECHNIQUE: Multiplanar, multiecho pulse sequences of the brain and surrounding structures were obtained without and with intravenous contrast.  CONTRAST:  71m MULTIHANCE GADOBENATE DIMEGLUMINE 529 MG/ML IV SOLN  COMPARISON:  10/18/2015 MRI of the head.  FINDINGS: Brain: No acute infarction, hemorrhage, hydrocephalus, extra-axial collection or mass lesion. Stable partially empty sella turcica. Few scattered foci of T2  FLAIR hyperintensity in subcortical and periventricular white matter and within the right caudate head are stable in distribution. No new susceptibility hypointensity to indicate interval intracranial hemorrhage. No new focus of abnormal enhancement of the brain parenchyma.  Vascular: Normal flow voids.  Skull and upper cervical spine: Normal marrow signal.  Sinuses/Orbits: Negative.  Other: None.  IMPRESSION: 1. No new focus of abnormal enhancement to suggest interval metastatic disease. 2. No acute  intracranial abnormality. 3. Stable foci of T2 FLAIR hyperintensity in white matter and in the right caudate head compatible with posttreatment changes and treated metastasis.   Electronically Signed   By: Kristine Garbe M.D.   On: 05/19/2016 15:20     ASSESSMENT and THERAPY PLAN:  Stage IV ER positive, HER-2 positive carcinoma of the breast Bone metastases Widespread Brain Metastases Insomnia Anxiety UE DVT, RIJ and R subclavian Cancer related Fatigue Decline in EF, Herceptin held. Herceptin restarted on 04/12/2015 06/2014 progression of disease (bone only) with rise in tumor markers, abnormal PET Arimidex, herceptin, XGEVA neuropathy  Clinically Laurelyn continues to do well. Imaging is excellent PET and brain MRI are reviewed with the patient and her husband. Results are noted above.   She simply does not like taking XELODA. We discussed infusional 5-FU and I advised Hector that if she can tolerate XELODA it really is the better choice. Her husband feels that she really does fine with treatment. She will think on this more. I have already dose reduced her.  Will continue to follow her closely and monitor side effects.  RTC 2 weeks. ADvised her to hold XELODA X 1 week then restart.   NCCN guidelines recommends the following for monitoring of metastatic breast cancer:  A. Components of monitoring:   1. Monitoring includes periodic assessment of varied  combinations of symptoms, physical examination, routine laboratory tests, imaging studies, and blood biomarkers where appropriate. Results of monitoring are classified as response/continued response to treatment, stable disease, uncertainty regarding disease status, or progression of disease. The clinician typically must assess and balance multiple different forms of information to make a determination regarding whether disease is being controlled and the toxicity of treatment is acceptable. Sometimes, this information may be contradictory.  B. Definition of disease progression:   1. Unequivocal evidence of progression of disease by one or more of these factors is required to establish progression of disease, either because of ineffective therapy or acquired resistance of disease to an applied therapy.  Progression of disease may be identified through evidence of growth or worsening of disease at previously known sites of disease and/or of the occurrence of new sites of metastatic disease.   2. Findings concerning for progression of disease include:    A. Worsening symptoms such as pain or dyspnea    B. Evidence of worsening or new disease on physical examination.    C. Declining performance status    D. Unexplained weight loss    E. Increasing Alkaline phosphatase, ALT, AST, or bilirubin    F. Hypercalcemia    G. New radiographic abnormality or increase in the size of pre-existing radiographic abnormality.    H. New areas of abnormality on functional imaging (eg, bone scan, PET/CT scan)    I. Increasing tumor markers (eg, CEA, CA 15-3, CA27.29)  All questions were answered. The patient knows to call the clinic with any problems, questions or concerns. We can certainly see the patient much sooner if necessary.   This document serves as a record of services personally performed by Ancil Linsey, MD. It was created on her behalf by Martinique Casey, a trained medical scribe. The creation of this record  is based on the scribe's personal observations and the provider's statements to them. This document has been checked and approved by the attending provider.  I have reviewed the above documentation for accuracy and completeness, and I agree with the above.  This note was signed electronically.  Kelby Fam. Whitney Muse, MD

## 2016-05-26 ENCOUNTER — Other Ambulatory Visit (HOSPITAL_COMMUNITY): Payer: Self-pay | Admitting: Oncology

## 2016-05-26 DIAGNOSIS — C50919 Malignant neoplasm of unspecified site of unspecified female breast: Secondary | ICD-10-CM

## 2016-05-26 MED ORDER — LAPATINIB DITOSYLATE 250 MG PO TABS: 1250.0000 mg | ORAL_TABLET | Freq: Every day | ORAL | 1 refills | Status: DC

## 2016-05-29 ENCOUNTER — Other Ambulatory Visit (HOSPITAL_COMMUNITY): Payer: Self-pay | Admitting: Oncology

## 2016-05-29 DIAGNOSIS — K219 Gastro-esophageal reflux disease without esophagitis: Secondary | ICD-10-CM

## 2016-06-04 ENCOUNTER — Other Ambulatory Visit (HOSPITAL_COMMUNITY): Payer: Self-pay

## 2016-06-04 ENCOUNTER — Other Ambulatory Visit (HOSPITAL_COMMUNITY): Payer: Self-pay | Admitting: Oncology

## 2016-06-04 DIAGNOSIS — C50919 Malignant neoplasm of unspecified site of unspecified female breast: Secondary | ICD-10-CM

## 2016-06-04 DIAGNOSIS — C7951 Secondary malignant neoplasm of bone: Secondary | ICD-10-CM

## 2016-06-04 MED ORDER — MORPHINE SULFATE ER 30 MG PO TBCR: 30.0000 mg | EXTENDED_RELEASE_TABLET | Freq: Two times a day (BID) | ORAL | 0 refills | Status: DC

## 2016-06-05 ENCOUNTER — Other Ambulatory Visit (HOSPITAL_COMMUNITY): Payer: Self-pay

## 2016-06-05 ENCOUNTER — Telehealth (HOSPITAL_COMMUNITY): Payer: Self-pay | Admitting: *Deleted

## 2016-06-05 DIAGNOSIS — C50919 Malignant neoplasm of unspecified site of unspecified female breast: Secondary | ICD-10-CM

## 2016-06-05 MED ORDER — CAPECITABINE 500 MG PO TABS: ORAL_TABLET | ORAL | 0 refills | Status: DC

## 2016-06-07 ENCOUNTER — Encounter (HOSPITAL_COMMUNITY): Payer: Self-pay | Admitting: Hematology & Oncology

## 2016-06-08 NOTE — Telephone Encounter (Signed)
Was this addressed?  TK

## 2016-06-08 NOTE — Telephone Encounter (Signed)
Yes, I had you sign it Friday and faxed it in.

## 2016-06-10 ENCOUNTER — Other Ambulatory Visit (HOSPITAL_COMMUNITY): Payer: 59

## 2016-06-10 ENCOUNTER — Encounter (HOSPITAL_BASED_OUTPATIENT_CLINIC_OR_DEPARTMENT_OTHER): Payer: 59

## 2016-06-10 ENCOUNTER — Telehealth (HOSPITAL_COMMUNITY): Payer: Self-pay | Admitting: Emergency Medicine

## 2016-06-10 ENCOUNTER — Encounter (HOSPITAL_COMMUNITY): Payer: Self-pay

## 2016-06-10 VITALS — BP 120/71 | HR 98 | Temp 98.0°F | Resp 18 | Wt 249.8 lb

## 2016-06-10 DIAGNOSIS — C787 Secondary malignant neoplasm of liver and intrahepatic bile duct: Secondary | ICD-10-CM | POA: Diagnosis not present

## 2016-06-10 DIAGNOSIS — C7931 Secondary malignant neoplasm of brain: Secondary | ICD-10-CM

## 2016-06-10 DIAGNOSIS — C7951 Secondary malignant neoplasm of bone: Secondary | ICD-10-CM | POA: Diagnosis not present

## 2016-06-10 DIAGNOSIS — C50919 Malignant neoplasm of unspecified site of unspecified female breast: Secondary | ICD-10-CM | POA: Diagnosis not present

## 2016-06-10 LAB — COMPREHENSIVE METABOLIC PANEL
ALK PHOS: 75 U/L (ref 38–126)
ALT: 18 U/L (ref 14–54)
AST: 21 U/L (ref 15–41)
Albumin: 3.9 g/dL (ref 3.5–5.0)
Anion gap: 9 (ref 5–15)
BUN: 10 mg/dL (ref 6–20)
CALCIUM: 9.2 mg/dL (ref 8.9–10.3)
CO2: 30 mmol/L (ref 22–32)
CREATININE: 0.7 mg/dL (ref 0.44–1.00)
Chloride: 97 mmol/L — ABNORMAL LOW (ref 101–111)
GFR calc Af Amer: >60 mL/min (ref >60)
GFR calc non Af Amer: >60 mL/min (ref >60)
GLUCOSE: 120 mg/dL — AB (ref 65–99)
Potassium: 3.5 mmol/L (ref 3.5–5.1)
SODIUM: 136 mmol/L (ref 135–145)
Total Bilirubin: 0.6 mg/dL (ref 0.3–1.2)
Total Protein: 7.1 g/dL (ref 6.5–8.1)

## 2016-06-10 MED ORDER — SODIUM CHLORIDE 0.9 % IV SOLN: 4.0000 mg | Freq: Once | INTRAVENOUS | Status: DC

## 2016-06-10 MED ORDER — LAPATINIB DITOSYLATE 250 MG PO TABS: 1250.0000 mg | ORAL_TABLET | Freq: Every day | ORAL | 1 refills | Status: DC

## 2016-06-10 MED ORDER — CAPECITABINE 500 MG PO TABS: ORAL_TABLET | ORAL | 0 refills | Status: DC

## 2016-06-10 MED ORDER — SODIUM CHLORIDE 0.9 % IV SOLN
INTRAVENOUS | Status: DC
  Administered 2016-06-10: 13:00:00 via INTRAVENOUS

## 2016-06-10 MED ORDER — HEPARIN SOD (PORK) LOCK FLUSH 100 UNIT/ML IV SOLN
500.0000 [IU] | Freq: Once | INTRAVENOUS | Status: AC
  Administered 2016-06-10: 500 [IU] via INTRAVENOUS

## 2016-06-10 MED ORDER — ZOLEDRONIC ACID 4 MG/100ML IV SOLN
4.0000 mg | Freq: Once | INTRAVENOUS | Status: AC
  Administered 2016-06-10: 4 mg via INTRAVENOUS
  Filled 2016-06-10: qty 100

## 2016-06-10 MED ORDER — SODIUM CHLORIDE 0.9% FLUSH
10.0000 mL | INTRAVENOUS | Status: DC | PRN
  Administered 2016-06-10: 10 mL via INTRAVENOUS
  Filled 2016-06-10: qty 10

## 2016-06-10 MED ORDER — ZOLEDRONIC ACID 4 MG/5ML IV CONC: 4.0000 mg | Freq: Once | INTRAVENOUS | Status: DC

## 2016-06-10 NOTE — Telephone Encounter (Signed)
Called to check on pt to see how she was doing.  She states that she could be better, but she will make it.  She said it is just a little scary.  I assured her that Dr Talbert Cage is a great doctor and that we as a team were still here for her.  She was thankful for my call.

## 2016-06-10 NOTE — Progress Notes (Signed)
Zometa given today per orders. Patient tolerated it well. No issues. Follow up as scheduled. vitlas stable and discharged home from clinic.

## 2016-06-10 NOTE — Patient Instructions (Signed)
[facility] at [facility] Discharge Instructions  RECOMMENDATIONS MADE BY THE CONSULTANT AND ANY TEST RESULTS WILL BE SENT TO YOUR REFERRING PHYSICIAN.  Zometa given today. Follow up as scheduled.  Thank you for choosing [facility] at [facility] to provide your oncology and hematology care.  To afford each patient quality time with our provider, please arrive at least 15 minutes before your scheduled appointment time.    If you have a lab appointment with the Cancer Center please come in thru the  Main Entrance and check in at the main information desk  You need to re-schedule your appointment should you arrive 10 or more minutes late.  We strive to give you quality time with our providers, and arriving late affects you and other patients whose appointments are after yours.  Also, if you no show three or more times for appointments you may be dismissed from the clinic at the providers discretion.     Again, thank you for choosing [facility].  Our hope is that these requests will decrease the amount of time that you wait before being seen by our physicians.       _____________________________________________________________  Should you have questions after your visit to [facility], please contact our office at [phone] between the hours of 8:30 a.m. and 4:30 p.m.  Voicemails left after 4:30 p.m. will not be returned until the following business day.  For prescription refill requests, have your pharmacy contact our office.       Resources For Cancer Patients and their Caregivers ? American Cancer Society: Can assist with transportation, wigs, general needs, runs Look Good Feel Better.        1-888-227-6333 ? Cancer Care: Provides financial assistance, online support groups, medication/co-pay assistance.  1-800-813-HOPE (4673) ? [facility] Assists [place] cancer patients and  their families through emotional , educational and financial support.  336-427-4357 ? [place] DSS Where to apply for food stamps, Medicaid and utility assistance. 336-342-1394 ? [facility]: Transportation to medical appointments. 336-347-2287 ? Social Security Administration: May apply for disability if have a Stage IV cancer. 336-342-7796 1-800-772-1213 ? [place] Aging, Disability and Transit Services: Assists with nutrition, care and transit needs. 336-349-2343  Cancer Center Support Programs: @10RELATIVEDAYS@ > Cancer Support Group  2nd Tuesday of the month 1pm-2pm, Journey Room  > Creative Journey  3rd Tuesday of the month 1130am-1pm, Journey Room  > Look Good Feel Better  1st Wednesday of the month 10am-12 noon, Journey Room (Call American Cancer Society to register 1-800-395-5775)   

## 2016-06-11 ENCOUNTER — Encounter (HOSPITAL_COMMUNITY): Payer: Self-pay | Admitting: Hematology & Oncology

## 2016-06-15 ENCOUNTER — Telehealth (HOSPITAL_COMMUNITY): Payer: Self-pay | Admitting: Emergency Medicine

## 2016-06-15 NOTE — Telephone Encounter (Signed)
Pt called and stated that she had went to [place] and left her chemo pills at home.  She was suppose to start taking them on Friday.  She will be home tonight.  She also has a bad sore throat with lots of drainage.  Dr Whitney Muse had given her an antibiotic for this just in case.  Spoke with Kirby Crigler PA she can start taking her chemo 06/16/2016 in the morning and she can use those antibiotics that dr Whitney Muse gave her.

## 2016-06-16 ENCOUNTER — Other Ambulatory Visit (HOSPITAL_COMMUNITY): Payer: Self-pay | Admitting: Oncology

## 2016-06-16 DIAGNOSIS — R5382 Chronic fatigue, unspecified: Secondary | ICD-10-CM

## 2016-06-16 DIAGNOSIS — C229 Malignant neoplasm of liver, not specified as primary or secondary: Secondary | ICD-10-CM

## 2016-06-16 DIAGNOSIS — C50919 Malignant neoplasm of unspecified site of unspecified female breast: Secondary | ICD-10-CM

## 2016-06-16 MED ORDER — METHYLPHENIDATE HCL 10 MG PO TABS: ORAL_TABLET | ORAL | 0 refills | Status: DC

## 2016-06-16 NOTE — Telephone Encounter (Signed)
Received refill request for ritalin from patients pharmacy. Chart checked , reviewed with provider and refilled.

## 2016-06-17 ENCOUNTER — Telehealth (HOSPITAL_COMMUNITY): Payer: Self-pay | Admitting: *Deleted

## 2016-06-19 ENCOUNTER — Ambulatory Visit (HOSPITAL_COMMUNITY): Payer: 59

## 2016-06-19 NOTE — Progress Notes (Signed)
Nutrition Follow-up:   Nutrition follow-up completed via phone this am.   Patient reports that appetite is better. Reports husband has made her some smoothies using recipes provided.  She continues to drink carnation instant breakfast and is trying to incorporate snacks as well as meals.  Reports that when she is taking xeloda has nausea and abdominal pain for about 2 days then resolves.     Medications: reviewed   Anthropometrics:   Noted weight on 1/31 249 pounds with weight on initial visit of 250 pounds on 1/12.  Weight has been fairly stable per chart   NUTRITION DIAGNOSIS: Inadequate oral intake improving   INTERVENTION:   Discussed strategies to handle nausea during days of taking xeloda.  Patient verbalized understanding.   Encouraged patient to continue to consume high calorie, high protein foods and eat small frequent meals. Patient has contact information if question or if needed    MONITORING, EVALUATION, GOAL: Patient will consume adequate calories to maintain lean muscle mass   NEXT VISIT: as needed.    [name] B. Zenia Resides, [place], [place] (pager)

## 2016-06-23 ENCOUNTER — Other Ambulatory Visit (HOSPITAL_COMMUNITY): Payer: Self-pay

## 2016-06-23 ENCOUNTER — Encounter (HOSPITAL_COMMUNITY): Payer: Self-pay | Admitting: Oncology

## 2016-06-23 DIAGNOSIS — F32A Depression, unspecified: Secondary | ICD-10-CM

## 2016-06-23 DIAGNOSIS — F329 Major depressive disorder, single episode, unspecified: Secondary | ICD-10-CM

## 2016-06-23 DIAGNOSIS — B370 Candidal stomatitis: Secondary | ICD-10-CM

## 2016-06-23 MED ORDER — VENLAFAXINE HCL ER 75 MG PO CP24: ORAL_CAPSULE | ORAL | 3 refills | Status: DC

## 2016-06-23 MED ORDER — FLUCONAZOLE 100 MG PO TABS: 100.0000 mg | ORAL_TABLET | Freq: Every day | ORAL | 0 refills | Status: AC

## 2016-06-23 NOTE — Telephone Encounter (Signed)
Received refill request from patients pharmacy for venlafaxine. Chart checked, reviewed with provider, and refilled.

## 2016-06-24 ENCOUNTER — Other Ambulatory Visit (HOSPITAL_COMMUNITY): Payer: Self-pay | Admitting: Hematology & Oncology

## 2016-06-24 DIAGNOSIS — C229 Malignant neoplasm of liver, not specified as primary or secondary: Secondary | ICD-10-CM

## 2016-06-24 DIAGNOSIS — C50919 Malignant neoplasm of unspecified site of unspecified female breast: Secondary | ICD-10-CM

## 2016-06-24 DIAGNOSIS — R5382 Chronic fatigue, unspecified: Secondary | ICD-10-CM

## 2016-06-30 ENCOUNTER — Other Ambulatory Visit (HOSPITAL_COMMUNITY): Payer: Self-pay | Admitting: Oncology

## 2016-06-30 ENCOUNTER — Telehealth (HOSPITAL_COMMUNITY): Payer: Self-pay | Admitting: *Deleted

## 2016-06-30 DIAGNOSIS — C7951 Secondary malignant neoplasm of bone: Secondary | ICD-10-CM

## 2016-06-30 DIAGNOSIS — C50919 Malignant neoplasm of unspecified site of unspecified female breast: Secondary | ICD-10-CM

## 2016-06-30 MED ORDER — MORPHINE SULFATE ER 30 MG PO TBCR: 30.0000 mg | EXTENDED_RELEASE_TABLET | Freq: Two times a day (BID) | ORAL | 0 refills | Status: DC

## 2016-06-30 NOTE — Telephone Encounter (Signed)
Rx printed.  TK 

## 2016-07-02 IMAGING — CT CT ABD-PEL WO/W CM
3 of 9 series · 14 of 46 positions shown, 16 images · IV contrast (omnipaque)
Comparison: Ultrasound 04/24/2014.  Abdominal pelvic CT 03/30/2007.

CLINICAL DATA: Left-sided chest pain with elevated liver function
studies and multiple liver lesions on ultrasound. No given history
of malignancy. Initial encounter.

EXAM:
CT ABDOMEN AND PELVIS WITHOUT AND WITH CONTRAST
TECHNIQUE: Multidetector CT imaging of the abdomen and pelvis was performed
following the standard protocol before and following the bolus
administration of intravenous contrast.
CONTRAST:  50mL OMNIPAQUE IOHEXOL 300 MG/ML SOLN, 100mL OMNIPAQUE
IOHEXOL 300 MG/ML SOLN

[Series 4: mpr arterial cor (id) · coronal · arterial · 0.62mm/px · 3 of 101 slices shown]
[im 26/101  soft-tissue]
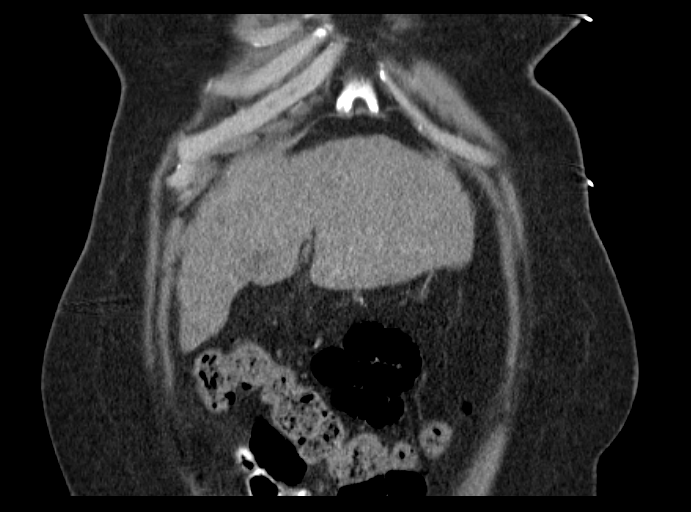
[im 51/101  soft-tissue]
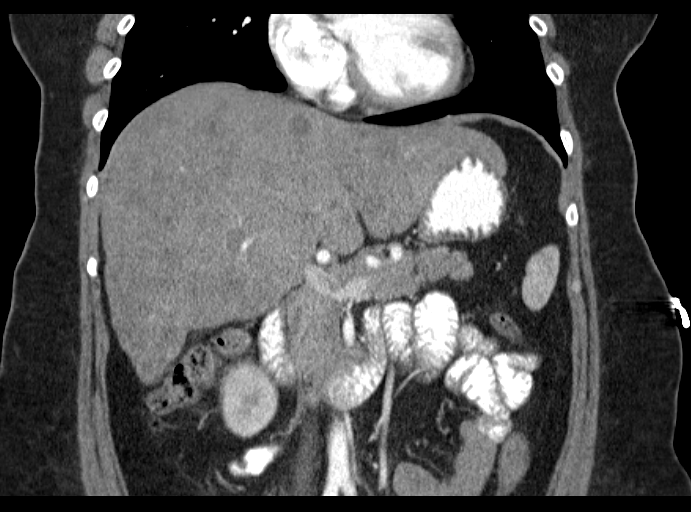
[im 76/101  soft-tissue]
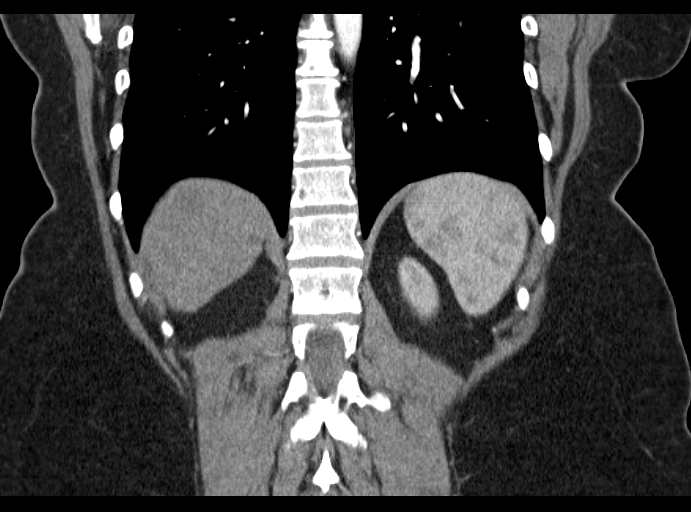

[Series 7: venous 60 sec 3.0 b40f · axial · portal-venous · 0.84mm/px · z∈[-488,-68]mm · 9 of 176 slices shown, 11 images]
[im 18/176  soft-tissue]
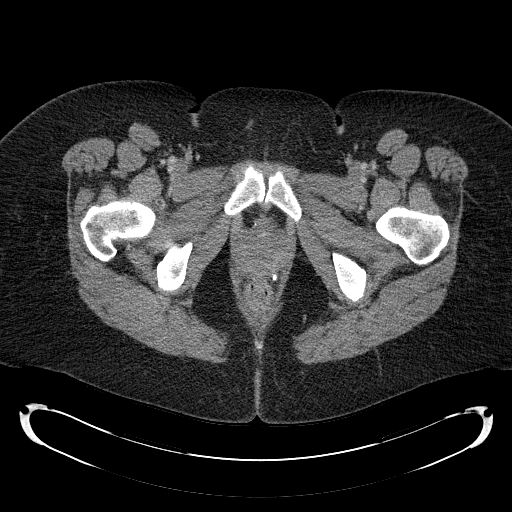
[im 18/176  bone]
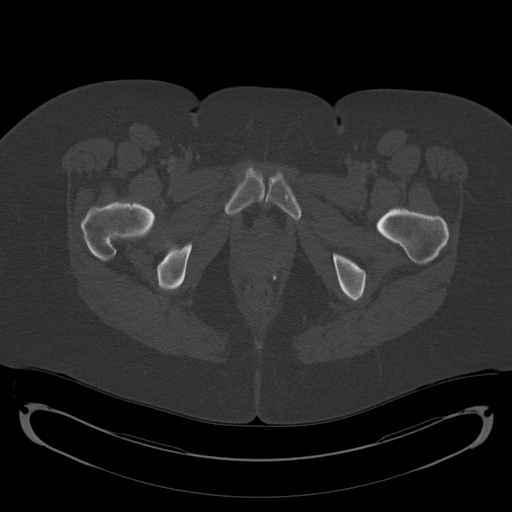
[im 36/176  soft-tissue]
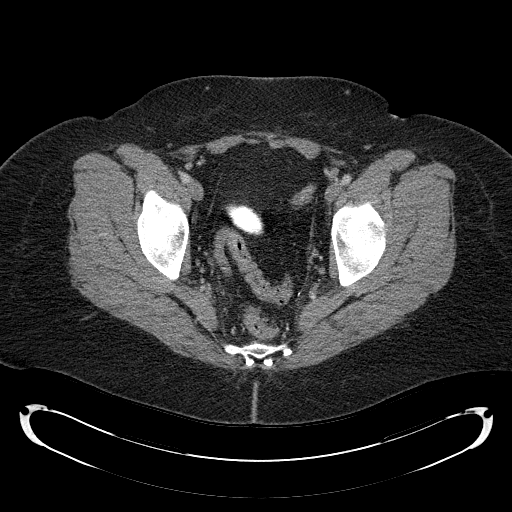
[im 53/176  soft-tissue]
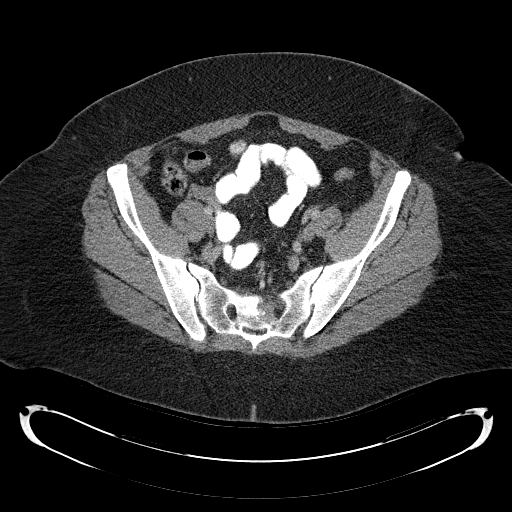
[im 71/176  soft-tissue]
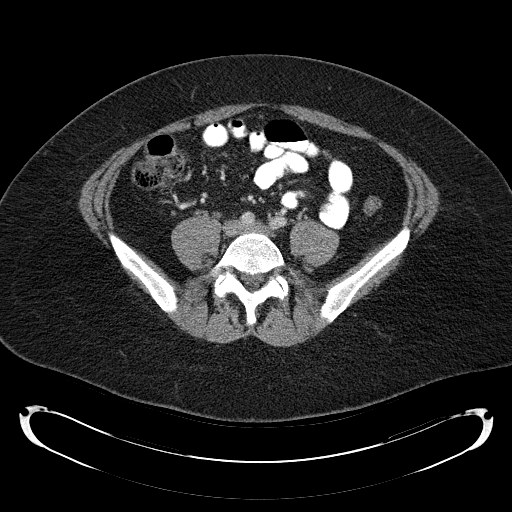
[im 88/176  soft-tissue]
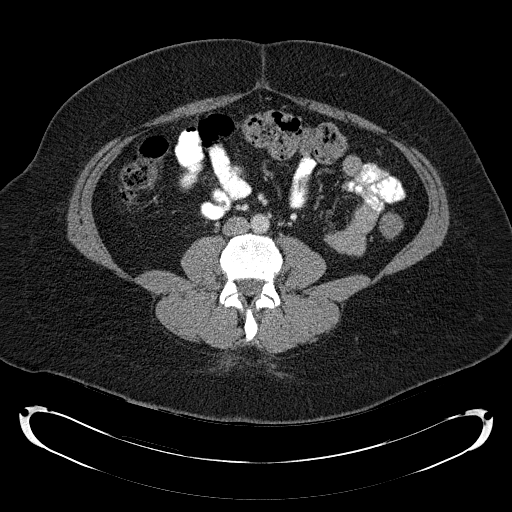
[im 106/176  soft-tissue]
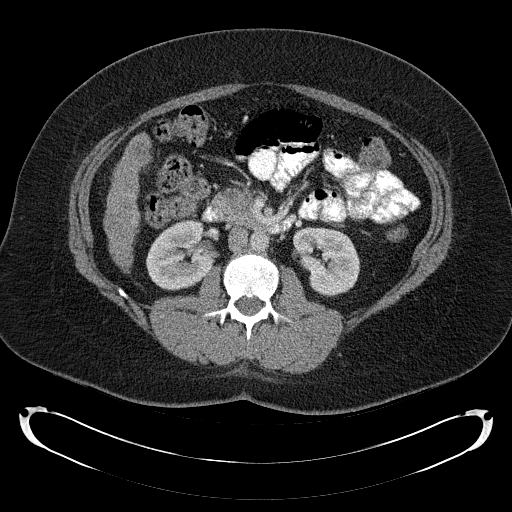
[im 123/176  soft-tissue]
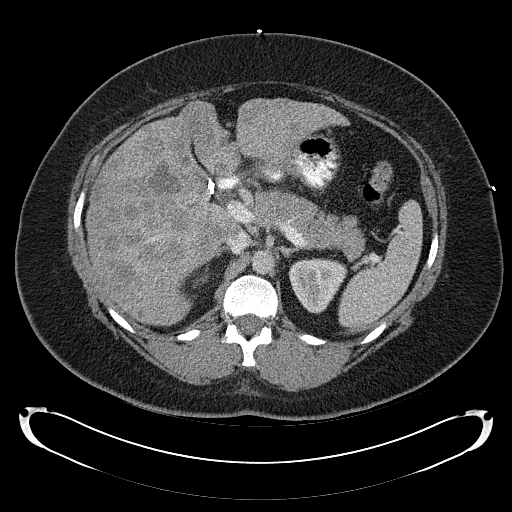
[im 141/176  soft-tissue]
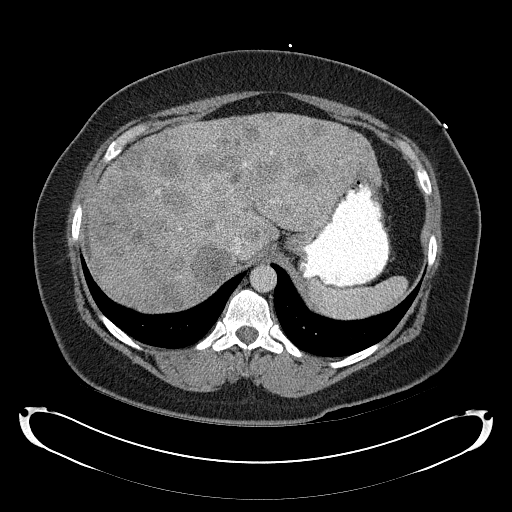
[im 158/176  soft-tissue]
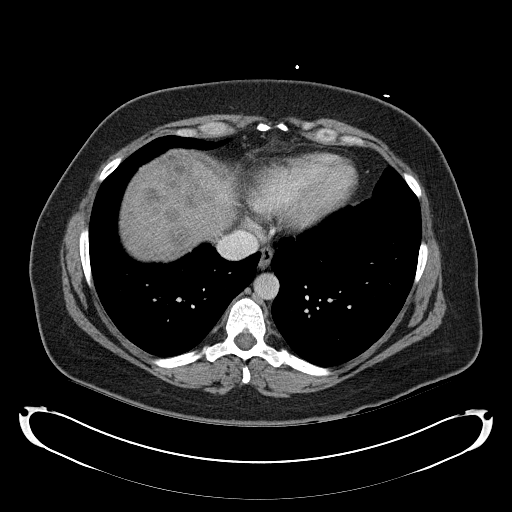
[im 158/176  bone]
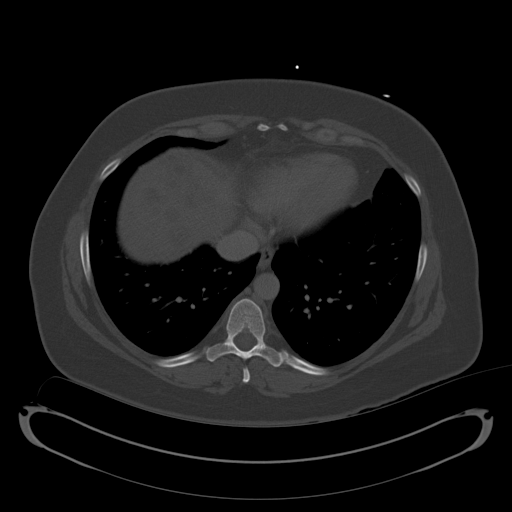

[Series 11: lung · axial · 0.84mm/px · z∈[-125,-71]mm · 2 of 56 slices shown]
[im 19/56  bone]
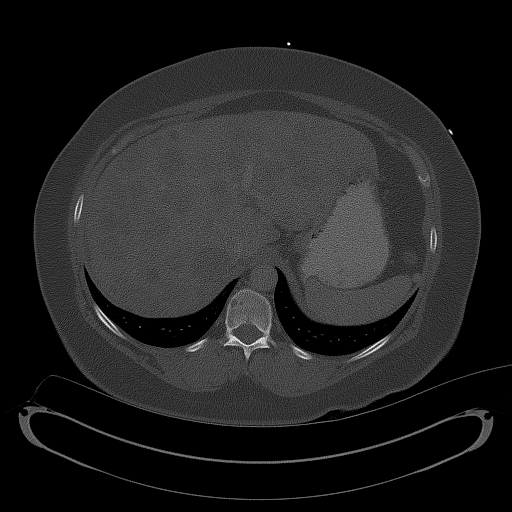
[im 37/56  bone]
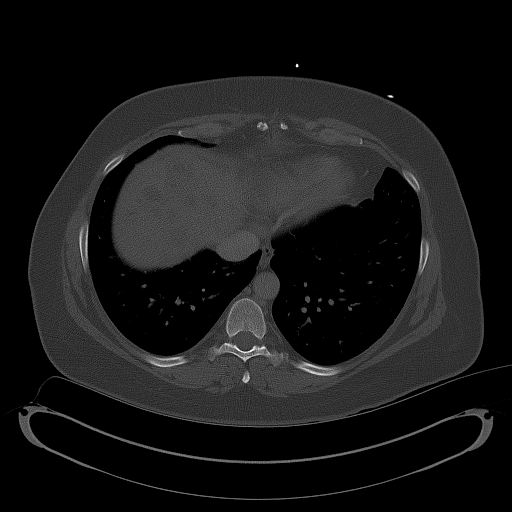

[14 of 46 positions shown; findings below may reference images not displayed]

FINDINGS: Lower chest: Clear lung bases. No significant pleural or pericardial
effusion.

Hepatobiliary: As demonstrated on ultrasound, there are innumerable
new liver lesions consistent with metastatic disease. Representative
lesions within the medial segment of the left lobe include a 3.4 cm
lesion on image 48 and a 2.8 cm lesion on image 55 of series 7. The
low-density lesion posteriorly in the right hepatic lobe is
unchanged from the prior CT and consistent with a hemangioma. This
measures 3.7 cm on image 37 of series 7. There is no significant
biliary dilatation status post cholecystectomy.

Pancreas: Unremarkable. No pancreatic ductal dilatation or
surrounding inflammatory changes.

Spleen: The spleen remains normal in size and demonstrates no
suspicious lesions. There is a knee 12 mm low-density lesion
anteriorly which has slightly enlarged from the prior study,
although is likely an incidental cyst. Small splenules are noted.

Adrenals/Urinary Tract: Both adrenal glands appear normal.The
kidneys appear normal without evidence of urinary tract calculus or
hydronephrosis. No bladder abnormalities are seen.

Stomach/Bowel: No evidence of bowel wall thickening, distention or
surrounding inflammatory change.Retrocecal surgical clips suggest
prior cholecystectomy. There is no extraluminal fluid collection.

Vascular/Lymphatic: There are no enlarged abdominal or pelvic lymph
nodes. No significant vascular findings are present.

Reproductive: Status post partial hysterectomy. Both ovaries are
visualized and appear normal.

Other: No evidence of abdominal wall mass or hernia.

Musculoskeletal: There are multiple new lytic lesions consistent
with metastatic disease. The largest is in the left sacrum measuring
2.0 cm on image 123. There are multiple small lesions within the
spine, largest at L4. There are small lesions within the proximal
femurs. No epidural tumor or pathologic fracture demonstrated.
IMPRESSION: 1. Widespread metastatic disease to the liver.
2. Multiple lytic lesions are noted throughout the spine and pelvis
consistent with metastatic disease. No pathologic fracture or
epidural tumor demonstrated.
3. No primary malignancy identified within the abdomen or pelvis.
Consider breast primary. The liver lesions should be amenable to
percutaneous biopsy.

## 2016-07-08 ENCOUNTER — Other Ambulatory Visit (HOSPITAL_COMMUNITY): Payer: Self-pay | Admitting: Oncology

## 2016-07-08 ENCOUNTER — Encounter (HOSPITAL_COMMUNITY): Payer: Self-pay | Admitting: Oncology

## 2016-07-08 ENCOUNTER — Encounter (HOSPITAL_COMMUNITY): Payer: Self-pay

## 2016-07-08 ENCOUNTER — Encounter (HOSPITAL_COMMUNITY)
Admission: RE | Admit: 2016-07-08 | Discharge: 2016-07-08 | Disposition: A | Discharge status: Home / Self Care | Payer: 59 | Source: Ambulatory Visit | Attending: Oncology | Admitting: Oncology

## 2016-07-08 ENCOUNTER — Encounter (HOSPITAL_COMMUNITY): Payer: 59 | Attending: Oncology | Admitting: Oncology

## 2016-07-08 ENCOUNTER — Encounter (HOSPITAL_COMMUNITY): Payer: 59

## 2016-07-08 VITALS — BP 137/82 | HR 91 | Temp 97.9°F | Resp 18 | Wt 251.6 lb

## 2016-07-08 DIAGNOSIS — C7951 Secondary malignant neoplasm of bone: Secondary | ICD-10-CM

## 2016-07-08 DIAGNOSIS — C229 Malignant neoplasm of liver, not specified as primary or secondary: Secondary | ICD-10-CM | POA: Insufficient documentation

## 2016-07-08 DIAGNOSIS — C787 Secondary malignant neoplasm of liver and intrahepatic bile duct: Secondary | ICD-10-CM

## 2016-07-08 DIAGNOSIS — C50919 Malignant neoplasm of unspecified site of unspecified female breast: Secondary | ICD-10-CM

## 2016-07-08 DIAGNOSIS — C50911 Malignant neoplasm of unspecified site of right female breast: Secondary | ICD-10-CM

## 2016-07-08 DIAGNOSIS — C7931 Secondary malignant neoplasm of brain: Secondary | ICD-10-CM | POA: Diagnosis not present

## 2016-07-08 LAB — CBC WITH DIFFERENTIAL/PLATELET
BASOS PCT: 0 %
Basophils Absolute: 0 10*3/uL (ref 0.0–0.1)
Eosinophils Absolute: 0 10*3/uL (ref 0.0–0.7)
Eosinophils Relative: 0 %
HEMATOCRIT: 38.6 % (ref 36.0–46.0)
HEMOGLOBIN: 12.7 g/dL (ref 12.0–15.0)
LYMPHS ABS: 1 10*3/uL (ref 0.7–4.0)
Lymphocytes Relative: 15 %
MCH: 30.5 pg (ref 26.0–34.0)
MCHC: 32.9 g/dL (ref 30.0–36.0)
MCV: 92.6 fL (ref 78.0–100.0)
MONOS PCT: 8 %
Monocytes Absolute: 0.5 10*3/uL (ref 0.1–1.0)
NEUTROS ABS: 5.2 10*3/uL (ref 1.7–7.7)
Neutrophils Relative %: 77 %
Platelets: 246 10*3/uL (ref 150–400)
RBC: 4.17 MIL/uL (ref 3.87–5.11)
RDW: 17.1 % — ABNORMAL HIGH (ref 11.5–15.5)
WBC: 6.7 10*3/uL (ref 4.0–10.5)

## 2016-07-08 LAB — COMPREHENSIVE METABOLIC PANEL
ALBUMIN: 3.8 g/dL (ref 3.5–5.0)
ALK PHOS: 83 U/L (ref 38–126)
ALT: 15 U/L (ref 14–54)
ANION GAP: 8 (ref 5–15)
AST: 20 U/L (ref 15–41)
BILIRUBIN TOTAL: 0.2 mg/dL — AB (ref 0.3–1.2)
BUN: 14 mg/dL (ref 6–20)
CALCIUM: 9.2 mg/dL (ref 8.9–10.3)
CO2: 30 mmol/L (ref 22–32)
CREATININE: 0.76 mg/dL (ref 0.44–1.00)
Chloride: 98 mmol/L — ABNORMAL LOW (ref 101–111)
GFR calc Af Amer: >60 mL/min (ref >60)
GFR calc non Af Amer: >60 mL/min (ref >60)
GLUCOSE: 130 mg/dL — AB (ref 65–99)
Potassium: 3.7 mmol/L (ref 3.5–5.1)
Sodium: 136 mmol/L (ref 135–145)
TOTAL PROTEIN: 7.3 g/dL (ref 6.5–8.1)

## 2016-07-08 MED ORDER — LAPATINIB DITOSYLATE 250 MG PO TABS: 1250.0000 mg | ORAL_TABLET | Freq: Every day | ORAL | 1 refills | Status: DC

## 2016-07-08 MED ORDER — ACETAMINOPHEN 325 MG PO TABS
650.0000 mg | ORAL_TABLET | Freq: Once | ORAL | Status: AC
  Administered 2016-07-08: 650 mg via ORAL

## 2016-07-08 MED ORDER — ACETAMINOPHEN 325 MG PO TABS
ORAL_TABLET | ORAL | Status: AC
  Filled 2016-07-08: qty 2

## 2016-07-08 MED ORDER — XELODA 500 MG PO TABS: ORAL_TABLET | ORAL | 0 refills | Status: DC

## 2016-07-08 MED ORDER — HEPARIN SOD (PORK) LOCK FLUSH 100 UNIT/ML IV SOLN
500.0000 [IU] | Freq: Once | INTRAVENOUS | Status: AC
  Administered 2016-07-08: 500 [IU] via INTRAVENOUS

## 2016-07-08 MED ORDER — SODIUM CHLORIDE 0.9 % IV SOLN
INTRAVENOUS | Status: DC
  Administered 2016-07-08: 12:00:00 via INTRAVENOUS

## 2016-07-08 MED ORDER — TECHNETIUM TC 99M-LABELED RED BLOOD CELLS IV KIT
20.0000 | PACK | Freq: Once | INTRAVENOUS | Status: AC | PRN
  Administered 2016-07-08: 20.9 via INTRAVENOUS

## 2016-07-08 MED ORDER — ZOLEDRONIC ACID 4 MG/100ML IV SOLN
4.0000 mg | Freq: Once | INTRAVENOUS | Status: AC
  Administered 2016-07-08: 4 mg via INTRAVENOUS
  Filled 2016-07-08: qty 100

## 2016-07-08 MED ORDER — HEPARIN SOD (PORK) LOCK FLUSH 100 UNIT/ML IV SOLN
INTRAVENOUS | Status: AC
  Filled 2016-07-08: qty 5

## 2016-07-08 NOTE — Progress Notes (Signed)
Allison Labrum, MD [place] [address]  Malignant neoplasm of breast, stage 4, unspecified laterality ([place]) - Plan: CBC with Differential, Comprehensive metabolic panel, Cancer antigen 27.29, Cancer antigen 15-3, CBC with Differential, Comprehensive metabolic panel, Cancer antigen 27.29, Cancer antigen 15-3  Metastatic breast cancer ([place]) - Plan: capecitabine (XELODA) 500 MG tablet, lapatinib (TYKERB) 250 MG tablet  CURRENT THERAPY: Xeloda 2000 mg in AM and 1500 mg in PM (initial dose was 2300 mg BID) and Tykerb 1250 mg daily.  Also on Zometa every 4 weeks.  INTERVAL HISTORY: [name] 51 y.o. female returns for followup of Stage IV breast cancer to bones, ER+/HER2+.     Breast cancer, stage 4 ([place])   04/25/2014 Initial Diagnosis    Breast cancer, stage 4      04/25/2014 Imaging    CT abdomen/pelvis with widespread metastatic disease to the liver, multiple lytic lesions throughout spine and pelvis. No FX or epidural tumor identified      04/26/2014 Imaging    CT head unremarkable      04/26/2014 Imaging    CT chest with no lung mass or pulmonary nodules, no adenopathy. Lytic bone lesions, right 2nd rib      04/27/2014 Initial Biopsy    U/S guided liver biopsy, lesion in anterior and inferior left hepatic lobe biopsied      04/27/2014 Pathology Results    Metastatic adenocarcinoma, CK7, ER+, patchy positivity with PR. Possible primary includes breast, less likely gynecologic      05/15/2014 Mammogram    BI-RADS CATEGORY  2: Benign Finding(s)      05/16/2014 PET scan    1. Intensely hypermetabolic hepatic metastasis. 2. Widespread hypermetabolic skeletal lesions. 3. No primary adenocarcinoma identified by FDG PET imaging.      05/19/2014 Imaging    MUGA- Left ventricular ejection fracture greater than 70%.      05/21/2014 Breast MRI    No suspicious masses or enhancement within the breasts. No axillary adenopathy.      05/22/2014 -  07/03/2014 Antibody Plan    Herceptin/Perjeta/Tamoxifen      06/12/2014 - 07/03/2014 Chemotherapy    Taxotere added secondary to persistent abdominal and back pain      06/17/2014 - 06/19/2014 Hospital Admission    Neutropenia, fever, diarrhea, nausea, vomiting      06/20/2014 - 07/10/2014 Radiation Therapy    Dr. Thea Silversmith 12 fractions to L3-S3 (30 Gy) and left scapula (20 Gy).       07/03/2014 Adverse Reaction    Perjeta- induced diarrhea.  Perjeta discontinued      07/16/2014 - 07/20/2014 Hospital Admission    Electrolyte abnormalities, and diarrhea.  Suspect Perjeta-induced diarrhea.  Negative GI work-up.      07/24/2014 - 08/19/2015 Chemotherapy    Herceptin/Tamoxifen/Xgeva      08/21/2014 Imaging    MUGA- Left ventricular ejection fraction equals 71%.      08/24/2014 PET scan    Dramatic reduction in metabolic activity of the widespread liver metastasis. Liver metastasis now have metabolic activity equal to background normal liver activity. Liver has a nodular contour. Marked reduction in metabolic activity of skeletal lesions..      10/05/2014 Progression    Widespread metastatic disease to the brain as described. Between 20 and 30 intracranial metastatic deposits are now seen. No midline shift or incipient herniation      10/09/2014 - 10/26/2014 Radiation Therapy    Whole Brain XRT  11/14/2014 Imaging    MUGA- LVEF 67%      02/13/2015 Imaging    MUGA- LVEF 59%      02/15/2015 Treatment Plan Change    Due to declining LVEF, will hold Herceptin per PI guidelines.      04/12/2015 -  Chemotherapy    Herceptin restarted      06/02/2015 - 06/08/2015 Hospital Admission    Pneumonia      07/05/2015 Progression     PET/CT concern for mild progression of skeletal metastasis with several lesions within the spine and 1 lesion in the Left iliac wing with mild to moderate metabolic activity new from prior. Rising CA 27-29      07/16/2015 - 10/23/2015 Anti-estrogen oral therapy      Arimidex      07/16/2015 Imaging    MRI brain with satisfactory post treatment apperance of brain. interval resolved enhancing R caudate metastasis, minimal punctate residual enhancing metastatic disease at the inferior L cerebellum. No new metastatic disease or new intracranial abnormality      07/19/2015 Treatment Plan Change    Discontinue Tamoxifen, Zoladex plus Arimidex.       08/27/2015 Procedure    Laparoscopic bilateral salpingo-oophorectomy by Dr. Gaetano Net      10/17/2015 PET scan    Osseous metastatic disease appears slightly progressive based on a new right scapular lesion and increased uptake within lesions in the thoracic spine, left iliac wing and  proximal right femur.      10/17/2015 Progression    Slight progression on PET scan imaging.      10/18/2015 Imaging    REsolved enhancing metastatic disease to the brain status post WBXRT      10/23/2015 - 02/12/2016 Adjuvant Chemotherapy    Faslodex loading followed by maintenance dose.  (Herceptin continued)      11/04/2015 Imaging    MUGA- LEFT ventricular ejection fraction 51% slightly decreased in a 57% on the previous exam.      12/24/2015 Treatment Plan Change    Zometa every 28 days.  Xgeva discontinued.        01/01/2016 Imaging    MUGA- Left ventricular ejection fraction equals 57.9%. This is increased from 51.1% previously.      02/03/2016 PET scan    1. Mixed metabolic changes in the scattered hypermetabolic sclerotic osseous metastases throughout the axial and proximal appendicular skeleton as detailed above. 2. No new sites of hypermetabolic metastatic disease. Stable pseudo-cirrhotic appearance of the liver due to treated liver metastases with no hypermetabolic liver metastases.       02/03/2016 Progression    PET shows mixed osseous response.  Some lesions more hypermetabolic, others improved.      02/05/2016 Treatment Plan Change    D/C Herceptin.  Faslodex as scheduled on 02/12/2016, then  discontinued.  Continue Zometa.      02/05/2016 Treatment Plan Change    Prescriptions for Xeloda 7 days on and 7 days off and Tykerb printed and provided for authorization.      02/19/2016 -  Chemotherapy    Xeloda 2300 mg BID 7 days on and 7 days off and Tykerb       02/24/2016 Treatment Plan Change    Xeloda dose reduced by 10% to 2000 mg BID week on and week off.      03/18/2016 Treatment Plan Change    Xeloda dose reduced to 2000 mg in AM and 1500 mg in PM 7 days on and 7 days off.  03/23/2016 Imaging    MUGA- Normal LEFT ventricular ejection fraction of 56% not significantly changed from 58% on previous exam.      05/19/2016 Imaging    MRI brain- . No new focus of abnormal enhancement to suggest interval metastatic disease. 2. No acute intracranial abnormality. 3. Stable foci of T2 FLAIR hyperintensity in white matter and in the right caudate head compatible with posttreatment changes and treated metastasis.      05/20/2016 PET scan    1. The multiple osseous metastatic lesions shown to be hypermetabolic on the prior exam are reduced in activity or even resolved in hypermetabolic activity compared to prior, as detailed above. There are also numerous sclerotic bony lesions wedge are not currently and were not previously hypermetabolic, compatible with old non active lesions. 2. No findings of extra osseous metastatic disease currently. 3. Hepatic pseudocirrhosis. 4. Healing rib fractures. Chronic pathologic fracture the left acromion.      07/08/2016 Imaging    MUGA- Low normal LEFT ventricular ejection fraction of 53%, little changed since the 56% on 03/23/2016 but slightly decreased from the 58% on 01/01/2016.       Realistically, Glendon is doing well with treatment.  She reports some nausea while on Xeloda therapy.  She actually reports some episodes of vomiting as well.  She knows that her home antiemetics are very effective for her.  Review of her weights  does not show any significant weight loss.  She notes that her issues resolve on her weeks off from therapy.  Otherwise, she is doing well.  She asks about changing from Xeloda to 5FU chemotherapy if that is more tolerable.    Review of Systems  Constitutional: Negative.  Negative for chills, fever and weight loss.  HENT: Negative.   Eyes: Negative.   Respiratory: Negative.  Negative for cough.   Cardiovascular: Negative.  Negative for chest pain.  Gastrointestinal: Positive for nausea and vomiting. Negative for blood in stool, constipation, diarrhea and melena.  Genitourinary: Negative.   Musculoskeletal: Negative.   Skin: Negative.   Neurological: Negative.  Negative for weakness.  Endo/Heme/Allergies: Negative.   Psychiatric/Behavioral: Negative.     Past Medical History:  Diagnosis Date  . Anticoagulated    xarelto  . Anxiety   . Breast cancer metastasized to multiple sites [facility])    liver, brain, and bone  . Breast cancer, stage 4 [facility]) oncologist-  dr Larene [place] ([facility])   dx 12/ 2015 -- breast cancer Stage 4,  ER/HER2 +,  w/  liver, brain and  bone mets/  chemotherapy and radiation therapy  . Chronic pain syndrome    secondary to cancer   . Depression   . Drug-induced cardiomyopathy ([place])    per last MUGA (08-08-2015), ef 56.5/ per last echo 05-18-2014 ef 60-65%  . Family history of prostate cancer   . GERD (gastroesophageal reflux disease)   . History of colon polyps    07-13-2013  benign  . History of DVT (deep vein thrombosis)    07-09-2014  upper right extremity-  RIJ and right subclavian--  resolved  . History of gastritis    erosive  . History of pneumonia    HCAP 06-07-2015--  resolved per cxr 07-04-2015  . History of radiation therapy    12 fractions to L3 - S3, 30Gy and left spacula 20Gy (06-20-2014 to 07-10-2014) //  whole brain rxt (10-09-2014 to 10-26-2014)  . History of small bowel obstruction    S/P RESECTION 2008  .  Migraine   . PONV  (postoperative nausea and vomiting)    pt states scope patch does well    Past Surgical History:  Procedure Laterality Date  . BREAST REDUCTION SURGERY  03/17/2011   Procedure: MAMMARY REDUCTION BILATERAL (BREAST);  Surgeon: Mary A Contogiannis;  Location: [facility];  Service: Plastics;  Laterality: Bilateral;  . CATARACT EXTRACTION W/ INTRAOCULAR LENS  IMPLANT, BILATERAL  2008  . CERVICAL FUSION  2003   C5 -- C6  . COLONOSCOPY N/A 07/13/2013   Procedure: COLONOSCOPY;  Surgeon: Rogene Houston, MD;  [facility];  Service: Endoscopy;  Laterality: N/A;  930  . COLONOSCOPY N/A 11/26/2014   Procedure: COLONOSCOPY;  Surgeon: Rogene Houston, MD;  [facility];  Service: Endoscopy;  Laterality: N/A;  730  . D & C HYSTEROSCOPY/  RESECTION ENDOMETRIAL MASS/  [place] ENDOMETRIAL ABLATION  04-11-2010  . DX LAPAROSCOPY W/ PARTIAL SMALL BOWEL RESECTION AND APPENDECTOMY  04-13-2007  . ESOPHAGOGASTRODUODENOSCOPY N/A 05/25/2014   Procedure: ESOPHAGOGASTRODUODENOSCOPY (EGD);  Surgeon: Rogene Houston, MD;  [facility];  Service: Endoscopy;  Laterality: N/A;  155  . KNEE ARTHROSCOPY Right 2005  . LAPAROSCOPIC ASSISTED VAGINAL HYSTERECTOMY  10-13-2010   w/ Bx Left Fallopian tube and Aspiration Right Ovarian Cyst  . LAPAROSCOPIC CHOLECYSTECTOMY  11-17-2002  . LAPAROSCOPIC SALPINGO OOPHERECTOMY Bilateral 08/26/2015   Procedure: LAPAROSCOPIC SALPINGO OOPHORECTOMY, bilateral;  Surgeon: Everlene Farrier, MD;  Location: [facility];  Service: Gynecology;  Laterality: Bilateral;  . PORTACATH PLACEMENT  05-17-2014  . SHOULDER ARTHROSCOPY WITH ROTATOR CUFF REPAIR Right 2002  . TRANSTHORACIC ECHOCARDIOGRAM  05-18-2014   ef 60-65%//   last MUGA  (08-08-2015)  ef 56.6%      Family History  Problem Relation Age of Onset  . Diabetes Father   . Heart attack Maternal Grandmother 30    multiple over lifetime.  . Cancer Maternal Grandmother 42    NOS  .  Prostate cancer Maternal Grandfather     dx in his 68s  . Lung cancer Paternal Grandfather     dx <50  . Lymphoma Maternal Aunt     dx in her 7s  . Melanoma Cousin 62    maternal first cousin  . Brain cancer Cousin     paternal first cousin dx under 30  . Prostate cancer Other     MGF's father  . Colon cancer Other     MGM's mother    Social History   Social History  . Marital status: Married    Spouse name: N/A  . Number of children: N/A  . Years of education: N/A   Social History Main Topics  . Smoking status: Former Smoker    Types: Cigarettes    Quit date: 08/20/1994  . Smokeless tobacco: Never Used  . Alcohol use Yes     Comment: Occasionally  . Drug use: No  . Sexual activity: Not Asked   Other Topics Concern  . None   Social History Narrative  . None     PHYSICAL EXAMINATION  ECOG PERFORMANCE STATUS: 1 - Symptomatic but completely ambulatory  There were no vitals filed for this visit.  Vitals - 1 value per visit 4/65/0354  SYSTOLIC 656  DIASTOLIC 82  Pulse 91  Temperature 97.9  Respirations 18  Weight (lb) 251.6    GENERAL:alert, no distress, well nourished, well developed, comfortable, cooperative, obese, smiling and in chemo-recliner with her husband nearby. SKIN: skin color, texture,  turgor are normal, no rashes or significant lesions HEAD: Normocephalic, No masses, lesions, tenderness or abnormalities EYES: normal, EOMI, Conjunctiva are pink and non-injected EARS: External ears normal OROPHARYNX:lips, buccal mucosa, and tongue normal and mucous membranes are moist  NECK: supple, trachea midline LYMPH:  no palpable lymphadenopathy BREAST:not examined LUNGS: clear to auscultation  HEART: regular rate & rhythm, no murmurs and no gallops ABDOMEN:abdomen soft and normal bowel sounds BACK: Back symmetric, no curvature. EXTREMITIES:less then 2 second capillary refill, no joint deformities, effusion, or inflammation, no skin discoloration, no  cyanosis  NEURO: alert & oriented x 3 with fluent speech, no focal motor/sensory deficits, gait normal   LABORATORY DATA: CBC    Component Value Date/Time   WBC 6.7 07/08/2016 1057   RBC 4.17 07/08/2016 1057   HGB 12.7 07/08/2016 1057   HCT 38.6 07/08/2016 1057   PLT 246 07/08/2016 1057   MCV 92.6 07/08/2016 1057   MCH 30.5 07/08/2016 1057   MCHC 32.9 07/08/2016 1057   RDW 17.1 (H) 07/08/2016 1057   LYMPHSABS 1.0 07/08/2016 1057   MONOABS 0.5 07/08/2016 1057   EOSABS 0.0 07/08/2016 1057   BASOSABS 0.0 07/08/2016 1057      Chemistry      Component Value Date/Time   NA 136 07/08/2016 1057   K 3.7 07/08/2016 1057   CL 98 (L) 07/08/2016 1057   CO2 30 07/08/2016 1057   BUN 14 07/08/2016 1057   CREATININE 0.76 07/08/2016 1057      Component Value Date/Time   CALCIUM 9.2 07/08/2016 1057   ALKPHOS 83 07/08/2016 1057   AST 20 07/08/2016 1057   ALT 15 07/08/2016 1057   BILITOT 0.2 (L) 07/08/2016 1057      Lab Results  Component Value Date   LABCA2 30.3 04/15/2016   Results for NAARA, KELTY (MRN [id number]) as of 07/08/2016 08:31  Ref. Range 04/15/2016 08:59  CA 15-3 Latest Ref Range: 0.0 - 25.0 U/mL 38.1 (H)    PENDING LABS:   RADIOGRAPHIC STUDIES:  Nm Cardiac Muga Rest  Result Date: 07/08/2016 CLINICAL DATA:  Stage IV breast cancer, cardiotoxic chemotherapy EXAM: NUCLEAR MEDICINE CARDIAC BLOOD POOL IMAGING (MUGA) TECHNIQUE: Cardiac multi-gated acquisition was performed at rest following intravenous injection of Tc-53mlabeled red blood cells. RADIOPHARMACEUTICALS:  20.9 mCi Tc-970mertechnetate in-vitro labeled autologous red blood cells IV COMPARISON:  03/23/2016, 01/01/2016 FINDINGS: Calculated LEFT ventricular ejection fraction is 53%, little changed from the 56% on the previous exam but slightly decreased from the 58% from an earlier study of in August 2017. Cine analysis in 3 projections demonstrates normal LEFT ventricular wall motion. IMPRESSION: Low normal  LEFT ventricular ejection fraction of 53%, little changed since the 56% on 03/23/2016 but slightly decreased from the 58% on 01/01/2016. No focal wall motion abnormalities. Electronically Signed   By: MaLavonia Dana.D.   On: 07/08/2016 15:36     PATHOLOGY:    ASSESSMENT AND PLAN:  Breast cancer, stage 4 ([facility]) Stage IV adenocarcinoma of the breast, ER+, HER 2 + disease with brain and bone metastases.  S/P BSO by Dr. ToGaetano Netn 08/27/2015.  Oncology history is updated.  Labs today: CBC diff, CMET, CA27.29 and CA 15-3.  I personally reviewed and went over laboratory results with the patient.  The results are noted within this dictation.  Labs in 4 weeks: CBC diff, CMET, CA 27.29, and CA 15-3.  Current therapy is Xeloda/Tykerb/Zometa.  She was initially started on 2300 mg of Xeloda BID but this has  now been dose reduced to 2000 mg in AM and 1500 mg in PM due to intolerance.    MUGA was completed on 03/23/2016.  I personally reviewed and went over laboratory results with the patient.  The results are noted within this dictation.  Next MUGA is due in Feb 2018.  Order is placed.  We have tried to get Sancuso approved for her, but her insurance has denies despite appeal.  Zometa is due today.  Return in 4 weeks for follow-up.   ORDERS PLACED FOR THIS ENCOUNTER: Orders Placed This Encounter  Procedures  . CBC with Differential  . Comprehensive metabolic panel  . Cancer antigen 27.29  . Cancer antigen 15-3  . CBC with Differential  . Comprehensive metabolic panel  . Cancer antigen 27.29  . Cancer antigen 15-3    MEDICATIONS PRESCRIBED THIS ENCOUNTER: No orders of the defined types were placed in this encounter.   THERAPY PLAN:  Xeloda/Tykerb/Zometa.    All questions were answered. The patient knows to call the clinic with any problems, questions or concerns. We can certainly see the patient much sooner if necessary.  Patient and plan discussed with Dr. Twana First and she is  in agreement with the aforementioned.   This note is electronically signed by: Doy Mince 07/08/2016 6:27 PM

## 2016-07-08 NOTE — Patient Instructions (Addendum)
[place] at [facility] Discharge Instructions  RECOMMENDATIONS MADE BY THE CONSULTANT AND ANY TEST RESULTS WILL BE SENT TO YOUR REFERRING PHYSICIAN.  You were seen today by Kirby Crigler PA-C. MUGA soon. Return in 4 weeks for labs, zometa and follow up.    Thank you for choosing [place] at [facility] to provide your oncology and hematology care.  To afford each patient quality time with our provider, please arrive at least 15 minutes before your scheduled appointment time.    If you have a lab appointment with the [facility] please come in thru the  Main Entrance and check in at the main information desk  You need to re-schedule your appointment should you arrive 10 or more minutes late.  We strive to give you quality time with our providers, and arriving late affects you and other patients whose appointments are after yours.  Also, if you no show three or more times for appointments you may be dismissed from the clinic at the providers discretion.     Again, thank you for choosing [facility].  Our hope is that these requests will decrease the amount of time that you wait before being seen by our physicians.       _____________________________________________________________  Should you have questions after your visit to [facility] please contact our office at [phone] between the hours of 8:30 a.m. and 4:30 p.m.  Voicemails left after 4:30 p.m. will not be returned until the following business day.  For prescription refill requests, have your pharmacy contact our office.       Resources For Cancer Patients and their Caregivers ? American Cancer Society: Can assist with transportation, wigs, general needs, runs Look Good Feel Better.        (440)585-4553 ? Cancer Care: Provides financial assistance, online support groups, medication/co-pay assistance.  1-800-813-HOPE 401-225-5195) ? [place] Assists [place] cancer patients and their families through emotional , educational and financial support.  531-479-5356 ? [place] DSS Where to apply for food stamps, Medicaid and utility assistance. (702) 095-7645 ? RCATS: Transportation to medical appointments. 765-585-8867 ? Social Security Administration: May apply for disability if have a Stage IV cancer. (717) 235-0046 747 886 5170 ? [employer], Disability and Transit Services: Assists with nutrition, care and transit needs. [place] Support Programs: @10RELATIVEDAYS @ > Cancer Support Group  2nd Tuesday of the month 1pm-2pm, Journey Room  > Creative Journey  3rd Tuesday of the month 1130am-1pm, Journey Room  > Look Good Feel Better  1st Wednesday of the month 10am-12 noon, Journey Room (Call [place] to register [phone])

## 2016-07-08 NOTE — Assessment & Plan Note (Signed)
Stage IV adenocarcinoma of the breast, ER+, HER 2 + disease with brain and bone metastases.  S/P BSO by Dr. Gaetano Net on 08/27/2015.  Oncology history is updated.  Labs today: CBC diff, CMET, CA27.29 and CA 15-3.  I personally reviewed and went over laboratory results with the patient.  The results are noted within this dictation.  Labs in 4 weeks: CBC diff, CMET, CA 27.29, and CA 15-3.  Current therapy is Xeloda/Tykerb/Zometa.  She was initially started on 2300 mg of Xeloda BID but this has now been dose reduced to 2000 mg in AM and 1500 mg in PM due to intolerance.    MUGA was completed on 03/23/2016.  I personally reviewed and went over laboratory results with the patient.  The results are noted within this dictation.  Next MUGA is due in Feb 2018.  Order is placed.  We have tried to get Sancuso approved for her, but her insurance has denies despite appeal.  Zometa is due today.  Return in 4 weeks for follow-up.

## 2016-07-08 NOTE — Patient Instructions (Signed)
[facility] at [facility] Discharge Instructions  RECOMMENDATIONS MADE BY THE CONSULTANT AND ANY TEST RESULTS WILL BE SENT TO YOUR REFERRING PHYSICIAN.  Zometa given today. Follow up as scheduled.  Thank you for choosing [facility] at [facility] to provide your oncology and hematology care.  To afford each patient quality time with our provider, please arrive at least 15 minutes before your scheduled appointment time.    If you have a lab appointment with the Cancer Center please come in thru the  Main Entrance and check in at the main information desk  You need to re-schedule your appointment should you arrive 10 or more minutes late.  We strive to give you quality time with our providers, and arriving late affects you and other patients whose appointments are after yours.  Also, if you no show three or more times for appointments you may be dismissed from the clinic at the providers discretion.     Again, thank you for choosing [facility].  Our hope is that these requests will decrease the amount of time that you wait before being seen by our physicians.       _____________________________________________________________  Should you have questions after your visit to [facility], please contact our office at [phone] between the hours of 8:30 a.m. and 4:30 p.m.  Voicemails left after 4:30 p.m. will not be returned until the following business day.  For prescription refill requests, have your pharmacy contact our office.       Resources For Cancer Patients and their Caregivers ? American Cancer Society: Can assist with transportation, wigs, general needs, runs Look Good Feel Better.        1-888-227-6333 ? Cancer Care: Provides financial assistance, online support groups, medication/co-pay assistance.  1-800-813-HOPE (4673) ? [facility] Assists [place] cancer patients and  their families through emotional , educational and financial support.  336-427-4357 ? [place] DSS Where to apply for food stamps, Medicaid and utility assistance. 336-342-1394 ? RCATS: Transportation to medical appointments. 336-347-2287 ? Social Security Administration: May apply for disability if have a Stage IV cancer. 336-342-7796 1-800-772-1213 ? [place] Aging, Disability and Transit Services: Assists with nutrition, care and transit needs. 336-349-2343  Cancer Center Support Programs: @10RELATIVEDAYS@ > Cancer Support Group  2nd Tuesday of the month 1pm-2pm, Journey Room  > Creative Journey  3rd Tuesday of the month 1130am-1pm, Journey Room  > Look Good Feel Better  1st Wednesday of the month 10am-12 noon, Journey Room (Call American Cancer Society to register 1-800-395-5775)   

## 2016-07-08 NOTE — Progress Notes (Signed)
zometa given today per orders. Patient tolerated it well. Vitals stable. Follow up as scheduled.

## 2016-07-09 LAB — CANCER ANTIGEN 15-3: CA 15-3: 38.6 U/mL — ABNORMAL HIGH (ref 0.0–25.0)

## 2016-07-09 LAB — CANCER ANTIGEN 27.29: CA 27.29: 38.7 U/mL — AB (ref 0.0–38.6)

## 2016-07-13 ENCOUNTER — Ambulatory Visit (INDEPENDENT_AMBULATORY_CARE_PROVIDER_SITE_OTHER): Payer: 59 | Admitting: Podiatry

## 2016-07-13 ENCOUNTER — Ambulatory Visit (INDEPENDENT_AMBULATORY_CARE_PROVIDER_SITE_OTHER): Payer: 59

## 2016-07-13 ENCOUNTER — Encounter: Payer: Self-pay | Admitting: Podiatry

## 2016-07-13 VITALS — BP 109/66 | HR 89 | Resp 16

## 2016-07-13 DIAGNOSIS — M79675 Pain in left toe(s): Secondary | ICD-10-CM | POA: Diagnosis not present

## 2016-07-13 DIAGNOSIS — L03032 Cellulitis of left toe: Secondary | ICD-10-CM

## 2016-07-13 MED ORDER — CEPHALEXIN 500 MG PO CAPS: 500.0000 mg | ORAL_CAPSULE | Freq: Three times a day (TID) | ORAL | 1 refills | Status: DC

## 2016-07-13 NOTE — Patient Instructions (Signed)

## 2016-07-14 ENCOUNTER — Other Ambulatory Visit (HOSPITAL_COMMUNITY): Payer: Self-pay | Admitting: Oncology

## 2016-07-15 ENCOUNTER — Other Ambulatory Visit (HOSPITAL_COMMUNITY): Payer: Self-pay | Admitting: Oncology

## 2016-07-15 DIAGNOSIS — C50911 Malignant neoplasm of unspecified site of right female breast: Secondary | ICD-10-CM

## 2016-07-15 NOTE — Progress Notes (Signed)
Subjective:     Patient ID: [name], female   DOB: 21-Oct-1965, 51 y.o.   MRN: [id number]  HPI patient presents with a painful nail of the left hallux stating that it's hard to wear shoe gear with and that she has tried to trim it herself and she does have cancer currently   Review of Systems  All other systems reviewed and are negative.      Objective:   Physical Exam  Constitutional: She is oriented to person, place, and time.  Cardiovascular: Intact distal pulses.   Musculoskeletal: Normal range of motion.  Neurological: She is oriented to person, place, and time.  Skin: Skin is warm.  Nursing note and vitals reviewed.  neurovascular status intact muscle strength adequate range of motion within normal limits with patient found to have incurvated left hallux lateral border with redness and drainage in the border that's painful when pressed. It is localized with no proximal edema erythema currently     Assessment:     Localized paronychia infection left hallux in immune compromised patient    Plan:     H&P x-ray reviewed and today I infiltrated the left hallux 60 mg like Marcaine mixture removed the lateral border removed all proud flesh abscess tissue and necrotic tissue and created a channel for drainage. Applied sterile dressing placed on cephalexin 500 mg 3 times a day gave strict instructions of any other changes were to occur or any systemic signs of infection were to occur she is to go straight to the emergency room  X-ray report indicates that there is no indications of lysis or bone pathology

## 2016-07-20 ENCOUNTER — Other Ambulatory Visit (HOSPITAL_COMMUNITY): Payer: Self-pay | Admitting: Oncology

## 2016-07-20 DIAGNOSIS — Z1231 Encounter for screening mammogram for malignant neoplasm of breast: Secondary | ICD-10-CM

## 2016-07-20 DIAGNOSIS — C229 Malignant neoplasm of liver, not specified as primary or secondary: Secondary | ICD-10-CM

## 2016-07-20 DIAGNOSIS — C50919 Malignant neoplasm of unspecified site of unspecified female breast: Secondary | ICD-10-CM

## 2016-07-20 DIAGNOSIS — R5382 Chronic fatigue, unspecified: Secondary | ICD-10-CM

## 2016-07-21 ENCOUNTER — Encounter (HOSPITAL_COMMUNITY): Payer: 59

## 2016-07-22 ENCOUNTER — Other Ambulatory Visit (HOSPITAL_COMMUNITY): Payer: Self-pay | Admitting: Oncology

## 2016-07-22 DIAGNOSIS — K219 Gastro-esophageal reflux disease without esophagitis: Secondary | ICD-10-CM

## 2016-07-24 ENCOUNTER — Other Ambulatory Visit (HOSPITAL_COMMUNITY): Payer: Self-pay | Admitting: *Deleted

## 2016-07-24 ENCOUNTER — Encounter (HOSPITAL_COMMUNITY): Payer: Self-pay

## 2016-07-24 ENCOUNTER — Ambulatory Visit (HOSPITAL_COMMUNITY): Payer: 59

## 2016-07-24 ENCOUNTER — Ambulatory Visit (HOSPITAL_COMMUNITY)
Admission: RE | Admit: 2016-07-24 | Discharge: 2016-07-24 | Disposition: A | Discharge status: Home / Self Care | Payer: 59 | Source: Ambulatory Visit | Attending: Oncology | Admitting: Oncology

## 2016-07-24 DIAGNOSIS — Z1231 Encounter for screening mammogram for malignant neoplasm of breast: Secondary | ICD-10-CM | POA: Insufficient documentation

## 2016-07-24 DIAGNOSIS — C7951 Secondary malignant neoplasm of bone: Secondary | ICD-10-CM

## 2016-07-24 DIAGNOSIS — C50919 Malignant neoplasm of unspecified site of unspecified female breast: Secondary | ICD-10-CM

## 2016-07-24 DIAGNOSIS — C229 Malignant neoplasm of liver, not specified as primary or secondary: Secondary | ICD-10-CM

## 2016-07-24 DIAGNOSIS — Z0000 Encounter for general adult medical examination without abnormal findings: Secondary | ICD-10-CM

## 2016-07-24 DIAGNOSIS — R5382 Chronic fatigue, unspecified: Secondary | ICD-10-CM

## 2016-07-24 MED ORDER — GABAPENTIN 300 MG PO CAPS: 300.0000 mg | ORAL_CAPSULE | Freq: Every day | ORAL | 3 refills | Status: DC

## 2016-07-24 MED ORDER — LORAZEPAM 0.5 MG PO TABS: 0.5000 mg | ORAL_TABLET | Freq: Four times a day (QID) | ORAL | 1 refills | Status: DC | PRN

## 2016-07-24 MED ORDER — METHYLPHENIDATE HCL 10 MG PO TABS
ORAL_TABLET | ORAL | 0 refills | Status: DC
Start: 2016-07-24 — End: 2016-09-02

## 2016-07-24 MED ORDER — MORPHINE SULFATE ER 30 MG PO TBCR: 30.0000 mg | EXTENDED_RELEASE_TABLET | Freq: Two times a day (BID) | ORAL | 0 refills | Status: DC

## 2016-08-05 ENCOUNTER — Encounter (HOSPITAL_BASED_OUTPATIENT_CLINIC_OR_DEPARTMENT_OTHER): Payer: 59

## 2016-08-05 ENCOUNTER — Encounter (HOSPITAL_COMMUNITY): Payer: 59 | Attending: Oncology | Admitting: Oncology

## 2016-08-05 ENCOUNTER — Encounter (HOSPITAL_COMMUNITY): Payer: Self-pay | Admitting: Oncology

## 2016-08-05 VITALS — BP 117/78 | HR 68 | Temp 98.1°F | Resp 16

## 2016-08-05 DIAGNOSIS — C787 Secondary malignant neoplasm of liver and intrahepatic bile duct: Secondary | ICD-10-CM

## 2016-08-05 DIAGNOSIS — C229 Malignant neoplasm of liver, not specified as primary or secondary: Secondary | ICD-10-CM | POA: Diagnosis not present

## 2016-08-05 DIAGNOSIS — G629 Polyneuropathy, unspecified: Secondary | ICD-10-CM | POA: Diagnosis not present

## 2016-08-05 DIAGNOSIS — R5382 Chronic fatigue, unspecified: Secondary | ICD-10-CM | POA: Insufficient documentation

## 2016-08-05 DIAGNOSIS — R1032 Left lower quadrant pain: Secondary | ICD-10-CM | POA: Diagnosis not present

## 2016-08-05 DIAGNOSIS — C50919 Malignant neoplasm of unspecified site of unspecified female breast: Secondary | ICD-10-CM

## 2016-08-05 DIAGNOSIS — C7951 Secondary malignant neoplasm of bone: Secondary | ICD-10-CM

## 2016-08-05 DIAGNOSIS — C7931 Secondary malignant neoplasm of brain: Secondary | ICD-10-CM

## 2016-08-05 LAB — COMPREHENSIVE METABOLIC PANEL
ALT: 15 U/L (ref 14–54)
ANION GAP: 8 (ref 5–15)
AST: 23 U/L (ref 15–41)
Albumin: 3.8 g/dL (ref 3.5–5.0)
Alkaline Phosphatase: 86 U/L (ref 38–126)
BUN: 9 mg/dL (ref 6–20)
CHLORIDE: 100 mmol/L — AB (ref 101–111)
CO2: 31 mmol/L (ref 22–32)
Calcium: 9.4 mg/dL (ref 8.9–10.3)
Creatinine, Ser: 0.88 mg/dL (ref 0.44–1.00)
GFR calc non Af Amer: >60 mL/min (ref >60)
Glucose, Bld: 140 mg/dL — ABNORMAL HIGH (ref 65–99)
POTASSIUM: 3.3 mmol/L — AB (ref 3.5–5.1)
SODIUM: 139 mmol/L (ref 135–145)
Total Bilirubin: 0.3 mg/dL (ref 0.3–1.2)
Total Protein: 7.1 g/dL (ref 6.5–8.1)

## 2016-08-05 LAB — CBC WITH DIFFERENTIAL/PLATELET
Basophils Absolute: 0 10*3/uL (ref 0.0–0.1)
Basophils Relative: 0 %
EOS ABS: 0 10*3/uL (ref 0.0–0.7)
EOS PCT: 0 %
HCT: 38.5 % (ref 36.0–46.0)
Hemoglobin: 12.4 g/dL (ref 12.0–15.0)
LYMPHS ABS: 1 10*3/uL (ref 0.7–4.0)
Lymphocytes Relative: 20 %
MCH: 29.8 pg (ref 26.0–34.0)
MCHC: 32.2 g/dL (ref 30.0–36.0)
MCV: 92.5 fL (ref 78.0–100.0)
Monocytes Absolute: 0.4 10*3/uL (ref 0.1–1.0)
Monocytes Relative: 8 %
NEUTROS PCT: 72 %
Neutro Abs: 3.4 10*3/uL (ref 1.7–7.7)
PLATELETS: 241 10*3/uL (ref 150–400)
RBC: 4.16 MIL/uL (ref 3.87–5.11)
RDW: 17.4 % — AB (ref 11.5–15.5)
WBC: 4.8 10*3/uL (ref 4.0–10.5)

## 2016-08-05 MED ORDER — ZOLEDRONIC ACID 4 MG/100ML IV SOLN
4.0000 mg | Freq: Once | INTRAVENOUS | Status: AC
  Administered 2016-08-05: 4 mg via INTRAVENOUS
  Filled 2016-08-05: qty 100

## 2016-08-05 MED ORDER — SODIUM CHLORIDE 0.9 % IV SOLN
INTRAVENOUS | Status: DC
  Administered 2016-08-05: 11:00:00 via INTRAVENOUS

## 2016-08-05 MED ORDER — HEPARIN SOD (PORK) LOCK FLUSH 100 UNIT/ML IV SOLN
500.0000 [IU] | Freq: Once | INTRAVENOUS | Status: AC
  Administered 2016-08-05: 500 [IU] via INTRAVENOUS

## 2016-08-05 MED ORDER — LIDOCAINE-PRILOCAINE 2.5-2.5 % EX CREA: 1.0000 "application " | TOPICAL_CREAM | Freq: Every day | CUTANEOUS | 2 refills | Status: DC | PRN

## 2016-08-05 NOTE — Progress Notes (Signed)
zometa given today per orders. Patient tolerated it well. Vitals stable and discharged home from clinic. Follow up as scheduled.

## 2016-08-05 NOTE — Patient Instructions (Signed)
[place] at [facility] Discharge Instructions  RECOMMENDATIONS MADE BY THE CONSULTANT AND ANY TEST RESULTS WILL BE SENT TO YOUR REFERRING PHYSICIAN.  You were seen today by Dr. Twana First Follow up in 4 weeks with labs See Amy up front for appointments   Thank you for choosing [place] at [facility] to provide your oncology and hematology care.  To afford each patient quality time with our provider, please arrive at least 15 minutes before your scheduled appointment time.    If you have a lab appointment with the [facility] please come in thru the  Main Entrance and check in at the main information desk  You need to re-schedule your appointment should you arrive 10 or more minutes late.  We strive to give you quality time with our providers, and arriving late affects you and other patients whose appointments are after yours.  Also, if you no show three or more times for appointments you may be dismissed from the clinic at the providers discretion.     Again, thank you for choosing [facility].  Our hope is that these requests will decrease the amount of time that you wait before being seen by our physicians.       _____________________________________________________________  Should you have questions after your visit to [facility], please contact our office at [phone] between the hours of 8:30 a.m. and 4:30 p.m.  Voicemails left after 4:30 p.m. will not be returned until the following business day.  For prescription refill requests, have your pharmacy contact our office.       Resources For Cancer Patients and their Caregivers ? American Cancer Society: Can assist with transportation, wigs, general needs, runs Look Good Feel Better.        (228) 888-7437 ? Cancer Care: Provides financial assistance, online support groups, medication/co-pay assistance.  1-800-813-HOPE (818)226-3153) ? [place] Assists [place] cancer patients and their families through emotional , educational and financial support.  618-434-7536 ? [place] DSS Where to apply for food stamps, Medicaid and utility assistance. 878 415 3420 ? [employer]: Transportation to medical appointments. 724-278-7678 ? Social Security Administration: May apply for disability if have a Stage IV cancer. 731-260-7500 (810)310-0538 ? [employer], Disability and Transit Services: Assists with nutrition, care and transit needs. [place] Support Programs: @10RELATIVEDAYS @ > Cancer Support Group  2nd Tuesday of the month 1pm-2pm, Journey Room  > Creative Journey  3rd Tuesday of the month 1130am-1pm, Journey Room  > Look Good Feel Better  1st Wednesday of the month 10am-12 noon, Journey Room (Call [place] to register [phone])

## 2016-08-05 NOTE — Patient Instructions (Signed)
[place] at [facility] Discharge Instructions  RECOMMENDATIONS MADE BY THE CONSULTANT AND ANY TEST RESULTS WILL BE SENT TO YOUR REFERRING PHYSICIAN.  Zometa given today  Follow up as scheduled  Thank you for choosing [place] at [facility] to provide your oncology and hematology care.  To afford each patient quality time with our provider, please arrive at least 15 minutes before your scheduled appointment time.    If you have a lab appointment with the [facility] please come in thru the  Main Entrance and check in at the main information desk  You need to re-schedule your appointment should you arrive 10 or more minutes late.  We strive to give you quality time with our providers, and arriving late affects you and other patients whose appointments are after yours.  Also, if you no show three or more times for appointments you may be dismissed from the clinic at the providers discretion.     Again, thank you for choosing [facility].  Our hope is that these requests will decrease the amount of time that you wait before being seen by our physicians.       _____________________________________________________________  Should you have questions after your visit to [facility], please contact our office at [phone] between the hours of 8:30 a.m. and 4:30 p.m.  Voicemails left after 4:30 p.m. will not be returned until the following business day.  For prescription refill requests, have your pharmacy contact our office.       Resources For Cancer Patients and their Caregivers ? American Cancer Society: Can assist with transportation, wigs, general needs, runs Look Good Feel Better.        706-401-2203 ? Cancer Care: Provides financial assistance, online support groups, medication/co-pay assistance.  1-800-813-HOPE 669-589-0509) ? [place] Assists [place] cancer patients and their  families through emotional , educational and financial support.  613-023-1219 ? [place] DSS Where to apply for food stamps, Medicaid and utility assistance. 431-120-0256 ? [employer]: Transportation to medical appointments. (669)423-2592 ? Social Security Administration: May apply for disability if have a Stage IV cancer. 906-723-3000 318-062-5826 ? [employer], Disability and Transit Services: Assists with nutrition, care and transit needs. [place] Support Programs: @10RELATIVEDAYS @ > Cancer Support Group  2nd Tuesday of the month 1pm-2pm, Journey Room  > Creative Journey  3rd Tuesday of the month 1130am-1pm, Journey Room  > Look Good Feel Better  1st Wednesday of the month 10am-12 noon, Journey Room (Call [place] to register [phone])

## 2016-08-05 NOTE — Progress Notes (Signed)
[place] [place], MD [place] [address]    Breast cancer, stage 4 [facility])   04/25/2014 Initial Diagnosis    Breast cancer, stage 4      04/25/2014 Imaging    CT abdomen/pelvis with widespread metastatic disease to the liver, multiple lytic lesions throughout spine and pelvis. No FX or epidural tumor identified      04/26/2014 Imaging    CT head unremarkable      04/26/2014 Imaging    CT chest with no lung mass or pulmonary nodules, no adenopathy. Lytic bone lesions, right 2nd rib      04/27/2014 Initial Biopsy    U/S guided liver biopsy, lesion in anterior and inferior left hepatic lobe biopsied      04/27/2014 Pathology Results    Metastatic adenocarcinoma, CK7, ER+, patchy positivity with PR. Possible primary includes breast, less likely gynecologic      05/15/2014 Mammogram    BI-RADS CATEGORY  2: Benign Finding(s)      05/16/2014 PET scan    1. Intensely hypermetabolic hepatic metastasis. 2. Widespread hypermetabolic skeletal lesions. 3. No primary adenocarcinoma identified by FDG PET imaging.      05/19/2014 Imaging    MUGA- Left ventricular ejection fracture greater than 70%.      05/21/2014 Breast MRI    No suspicious masses or enhancement within the breasts. No axillary adenopathy.      05/22/2014 - 07/03/2014 Antibody Plan    Herceptin/Perjeta/Tamoxifen      06/12/2014 - 07/03/2014 Chemotherapy    Taxotere added secondary to persistent abdominal and back pain      06/17/2014 - 06/19/2014 Hospital Admission    Neutropenia, fever, diarrhea, nausea, vomiting      06/20/2014 - 07/10/2014 Radiation Therapy    Dr. Thea Silversmith 12 fractions to L3-S3 (30 Gy) and left scapula (20 Gy).       07/03/2014 Adverse Reaction    Perjeta- induced diarrhea.  Perjeta discontinued      07/16/2014 - 07/20/2014 Hospital Admission    Electrolyte abnormalities, and diarrhea.  Suspect Perjeta-induced diarrhea.   Negative GI work-up.      07/24/2014 - 08/19/2015 Chemotherapy    Herceptin/Tamoxifen/Xgeva      08/21/2014 Imaging    MUGA- Left ventricular ejection fraction equals 71%.      08/24/2014 PET scan    Dramatic reduction in metabolic activity of the widespread liver metastasis. Liver metastasis now have metabolic activity equal to background normal liver activity. Liver has a nodular contour. Marked reduction in metabolic activity of skeletal lesions..      10/05/2014 Progression    Widespread metastatic disease to the brain as described. Between 20 and 30 intracranial metastatic deposits are now seen. No midline shift or incipient herniation      10/09/2014 - 10/26/2014 Radiation Therapy    Whole Brain XRT      11/14/2014 Imaging    MUGA- LVEF 67%      02/13/2015 Imaging    MUGA- LVEF 59%      02/15/2015 Treatment Plan Change    Due to declining LVEF, will hold Herceptin per PI guidelines.      04/12/2015 -  Chemotherapy    Herceptin restarted      06/02/2015 - 06/08/2015 Hospital Admission    Pneumonia      07/05/2015 Progression     PET/CT concern for mild progression of skeletal metastasis with several lesions within the  spine and 1 lesion in the Left iliac wing with mild to moderate metabolic activity new from prior. Rising CA 27-29      07/16/2015 - 10/23/2015 Anti-estrogen oral therapy    Arimidex      07/16/2015 Imaging    MRI brain with satisfactory post treatment apperance of brain. interval resolved enhancing R caudate metastasis, minimal punctate residual enhancing metastatic disease at the inferior L cerebellum. No new metastatic disease or new intracranial abnormality      07/19/2015 Treatment Plan Change    Discontinue Tamoxifen, Zoladex plus Arimidex.       08/27/2015 Procedure    Laparoscopic bilateral salpingo-oophorectomy by Dr. Gaetano Net      10/17/2015 PET scan    Osseous metastatic disease appears slightly progressive based on a new right scapular lesion and  increased uptake within lesions in the thoracic spine, left iliac wing and  proximal right femur.      10/17/2015 Progression    Slight progression on PET scan imaging.      10/18/2015 Imaging    REsolved enhancing metastatic disease to the brain status post WBXRT      10/23/2015 - 02/12/2016 Adjuvant Chemotherapy    Faslodex loading followed by maintenance dose.  (Herceptin continued)      11/04/2015 Imaging    MUGA- LEFT ventricular ejection fraction 51% slightly decreased in a 57% on the previous exam.      12/24/2015 Treatment Plan Change    Zometa every 28 days.  Xgeva discontinued.        01/01/2016 Imaging    MUGA- Left ventricular ejection fraction equals 57.9%. This is increased from 51.1% previously.      02/03/2016 PET scan    1. Mixed metabolic changes in the scattered hypermetabolic sclerotic osseous metastases throughout the axial and proximal appendicular skeleton as detailed above. 2. No new sites of hypermetabolic metastatic disease. Stable pseudo-cirrhotic appearance of the liver due to treated liver metastases with no hypermetabolic liver metastases.       02/03/2016 Progression    PET shows mixed osseous response.  Some lesions more hypermetabolic, others improved.      02/05/2016 Treatment Plan Change    D/C Herceptin.  Faslodex as scheduled on 02/12/2016, then discontinued.  Continue Zometa.      02/05/2016 Treatment Plan Change    Prescriptions for Xeloda 7 days on and 7 days off and Tykerb printed and provided for authorization.      02/19/2016 -  Chemotherapy    Xeloda 2300 mg BID 7 days on and 7 days off and Tykerb       02/24/2016 Treatment Plan Change    Xeloda dose reduced by 10% to 2000 mg BID week on and week off.      03/18/2016 Treatment Plan Change    Xeloda dose reduced to 2000 mg in AM and 1500 mg in PM 7 days on and 7 days off.      03/23/2016 Imaging    MUGA- Normal LEFT ventricular ejection fraction of 56% not  significantly changed from 58% on previous exam.      05/19/2016 Imaging    MRI brain- . No new focus of abnormal enhancement to suggest interval metastatic disease. 2. No acute intracranial abnormality. 3. Stable foci of T2 FLAIR hyperintensity in white matter and in the right caudate head compatible with posttreatment changes and treated metastasis.      05/20/2016 PET scan    1. The multiple osseous metastatic lesions shown to be hypermetabolic  on the prior exam are reduced in activity or even resolved in hypermetabolic activity compared to prior, as detailed above. There are also numerous sclerotic bony lesions wedge are not currently and were not previously hypermetabolic, compatible with old non active lesions. 2. No findings of extra osseous metastatic disease currently. 3. Hepatic pseudocirrhosis. 4. Healing rib fractures. Chronic pathologic fracture the left acromion.      07/08/2016 Imaging    MUGA- Low normal LEFT ventricular ejection fraction of 53%, little changed since the 56% on 03/23/2016 but slightly decreased from the 58% on 01/01/2016.       CURRENT THERAPY: XELODA/Tykerb/Zometa  INTERVAL HISTORY: [name] 51 y.o. female returns for follow-up of stage IV adenocarcinoma of the breast, ER+, HER 2 + disease. History of brain metastases treated with WBRT.  She is doing well today. She gets very tired and nauseated after the Xeloda. She has a lot of itching and neuropathy in her hands and feet. She has some mild left shoulder pain, but otherwise denies bone pain. She has some SOB with exertion. She has some intermittent left lower quadrant abdominal pain and cramping. Denies constipation, diarrhea, chest pain, or any other concerns.   MEDICAL HISTORY: Past Medical History:  Diagnosis Date  . Anticoagulated    xarelto  . Anxiety   . Breast cancer metastasized to multiple sites [facility])    liver, brain, and bone  . Breast cancer, stage 4 [facility]) oncologist-   dr Larene [place] ([facility])   dx 12/ 2015 -- breast cancer Stage 4,  ER/HER2 +,  w/  liver, brain and  bone mets/  chemotherapy and radiation therapy  . Chronic pain syndrome    secondary to cancer   . Depression   . Drug-induced cardiomyopathy ([place])    per last MUGA (08-08-2015), ef 56.5/ per last echo 05-18-2014 ef 60-65%  . Family history of prostate cancer   . GERD (gastroesophageal reflux disease)   . History of colon polyps    07-13-2013  benign  . History of DVT (deep vein thrombosis)    07-09-2014  upper right extremity-  RIJ and right subclavian--  resolved  . History of gastritis    erosive  . History of pneumonia    HCAP 06-07-2015--  resolved per cxr 07-04-2015  . History of radiation therapy    12 fractions to L3 - S3, 30Gy and left spacula 20Gy (06-20-2014 to 07-10-2014) //  whole brain rxt (10-09-2014 to 10-26-2014)  . History of small bowel obstruction    S/P RESECTION 2008  . Migraine   . PONV (postoperative nausea and vomiting)    pt states scope patch does well    has Hyperlipidemia; Depression; Breast cancer, stage 4 ([place]); Bone metastases ([place]); DVT (deep venous thrombosis) ([place]); GERD (gastroesophageal reflux disease); Family history of prostate cancer; Genetic testing; Brain metastases ([place]); Drug-induced cardiomyopathy ([place]); HCAP (healthcare-associated pneumonia); Pneumonia; Metastatic breast cancer ([place]); Esophageal reflux; and Mouth sores on her problem list.      has No Known Allergies.  Ms. Fiser had no medications administered during this visit.  SURGICAL HISTORY: Past Surgical History:  Procedure Laterality Date  . BREAST REDUCTION SURGERY  03/17/2011   Procedure: MAMMARY REDUCTION BILATERAL (BREAST);  Surgeon: Mary A Contogiannis;  Location: [facility];  Service: Plastics;  Laterality: Bilateral;  . CATARACT EXTRACTION W/ INTRAOCULAR LENS  IMPLANT, BILATERAL  2008  . CERVICAL FUSION  2003   C5 -- C6  . COLONOSCOPY N/A  07/13/2013  Procedure: COLONOSCOPY;  Surgeon: Rogene Houston, MD;  [facility];  Service: Endoscopy;  Laterality: N/A;  930  . COLONOSCOPY N/A 11/26/2014   Procedure: COLONOSCOPY;  Surgeon: Rogene Houston, MD;  [facility];  Service: Endoscopy;  Laterality: N/A;  730  . D & C HYSTEROSCOPY/  RESECTION ENDOMETRIAL MASS/  [place] ENDOMETRIAL ABLATION  04-11-2010  . DX LAPAROSCOPY W/ PARTIAL SMALL BOWEL RESECTION AND APPENDECTOMY  04-13-2007  . ESOPHAGOGASTRODUODENOSCOPY N/A 05/25/2014   Procedure: ESOPHAGOGASTRODUODENOSCOPY (EGD);  Surgeon: Rogene Houston, MD;  [facility];  Service: Endoscopy;  Laterality: N/A;  155  . KNEE ARTHROSCOPY Right 2005  . LAPAROSCOPIC ASSISTED VAGINAL HYSTERECTOMY  10-13-2010   w/ Bx Left Fallopian tube and Aspiration Right Ovarian Cyst  . LAPAROSCOPIC CHOLECYSTECTOMY  11-17-2002  . LAPAROSCOPIC SALPINGO OOPHERECTOMY Bilateral 08/26/2015   Procedure: LAPAROSCOPIC SALPINGO OOPHORECTOMY, bilateral;  Surgeon: Everlene Farrier, MD;  Location: [place];  Service: Gynecology;  Laterality: Bilateral;  . PORTACATH PLACEMENT  05-17-2014  . REDUCTION MAMMAPLASTY    . SHOULDER ARTHROSCOPY WITH ROTATOR CUFF REPAIR Right 2002  . TRANSTHORACIC ECHOCARDIOGRAM  05-18-2014   ef 60-65%//   last MUGA  (08-08-2015)  ef 56.6%      SOCIAL HISTORY: Social History   Social History  . Marital status: Married    Spouse name: N/A  . Number of children: N/A  . Years of education: N/A   Occupational History  . Not on file.   Social History Main Topics  . Smoking status: Former Smoker    Types: Cigarettes    Quit date: 08/20/1994  . Smokeless tobacco: Never Used  . Alcohol use Yes     Comment: Occasionally  . Drug use: No  . Sexual activity: Not on file   Other Topics Concern  . Not on file   Social History Narrative  . No narrative on file    FAMILY HISTORY: Family History  Problem Relation Age of Onset  . Diabetes  Father   . Heart attack Maternal Grandmother 30    multiple over lifetime.  . Cancer Maternal Grandmother 37    NOS  . Prostate cancer Maternal Grandfather     dx in his 23s  . Lung cancer Paternal Grandfather     dx <50  . Lymphoma Maternal Aunt     dx in her 69s  . Melanoma Cousin 17    maternal first cousin  . Brain cancer Cousin     paternal first cousin dx under 62  . Prostate cancer Other     MGF's father  . Colon cancer Other     MGM's mother  Her son lives in [place]. Her daughter lives in [place].  Review of Systems  Constitutional: Positive for malaise/fatigue.  HENT: Negative.   Eyes: Negative.   Respiratory: Positive for cough and shortness of breath (exertion).   Cardiovascular: Negative.  Negative for chest pain.  Gastrointestinal: Positive for abdominal pain (cramping) and nausea. Negative for constipation and diarrhea.  Genitourinary: Negative.   Musculoskeletal: Positive for joint pain (L shoulder).       No bone pain  Skin: Negative.   Neurological: Positive for tingling (hands and feet).       Itching on hands and feet  Endo/Heme/Allergies: Negative.   Psychiatric/Behavioral: Negative.   All other systems reviewed and are negative. 14 point review of systems was performed and is negative except as detailed under history of present illness and above  PHYSICAL EXAMINATION ECOG PERFORMANCE STATUS: 1 - Symptomatic but completely ambulatory   Vitals:   08/05/16 1040  BP: (!) 126/92  Pulse: 98  Resp: 18  Temp: 98.2 F (36.8 C)    Physical Exam  Constitutional: She is oriented to person, place, and time and well-developed, well-nourished, and in no distress.  HENT:  Head: Normocephalic and atraumatic.  Mouth/Throat: No oropharyngeal exudate.  Eyes: EOM are normal. Pupils are equal, round, and reactive to light. Right eye exhibits no discharge. Left eye exhibits no discharge. No scleral icterus.  Neck: Normal range of motion. Neck supple. No  thyromegaly present.  Cardiovascular: Normal rate, regular rhythm and normal heart sounds.  Exam reveals no gallop and no friction rub.   No murmur heard. Pulmonary/Chest: Effort normal and breath sounds normal. No respiratory distress. She has no wheezes. She has no rales.  Abdominal: Soft. Bowel sounds are normal. She exhibits no distension and no mass. There is no tenderness. There is no rebound and no guarding.  Musculoskeletal: Normal range of motion. She exhibits no edema or tenderness.  Lymphadenopathy:    She has no cervical adenopathy.  Neurological: She is alert and oriented to person, place, and time. She displays normal reflexes. No cranial nerve deficit. Gait normal. Coordination normal.  Skin: Skin is warm and dry.  Psychiatric: Mood, memory, affect and judgment normal.  Nursing note and vitals reviewed.  LABORATORY DATA: I have reviewed the data as listed. CBC Latest Ref Rng & Units 07/08/2016 05/13/2016 04/15/2016  WBC 4.0 - 10.5 K/uL 6.7 6.7 5.4  Hemoglobin 12.0 - 15.0 g/dL 12.7 12.4 12.6  Hematocrit 36.0 - 46.0 % 38.6 38.2 39.1  Platelets 150 - 400 K/uL 246 232 252   CMP Latest Ref Rng & Units 07/08/2016 06/10/2016 05/13/2016  Glucose 65 - 99 mg/dL 130(H) 120(H) 120(H)  BUN 6 - 20 mg/dL '14 10 9  ' Creatinine 0.44 - 1.00 mg/dL 0.76 0.70 0.80  Sodium 135 - 145 mmol/L 136 136 137  Potassium 3.5 - 5.1 mmol/L 3.7 3.5 3.4(L)  Chloride 101 - 111 mmol/L 98(L) 97(L) 100(L)  CO2 22 - 32 mmol/L '30 30 29  ' Calcium 8.9 - 10.3 mg/dL 9.2 9.2 9.1  Total Protein 6.5 - 8.1 g/dL 7.3 7.1 7.2  Total Bilirubin 0.3 - 1.2 mg/dL 0.2(L) 0.6 0.5  Alkaline Phos 38 - 126 U/L 83 75 92  AST 15 - 41 U/L '20 21 22  ' ALT 14 - 54 U/L '15 18 16   ' RADIOLOGY: I have personally reviewed the radiological images as listed and agreed with the findings in the report.  Screening Mammogram 07/24/2016 IMPRESSION: No mammographic evidence of malignancy. A result letter of this screening mammogram will be mailed  directly to the patient.  ASSESSMENT and THERAPY PLAN:  Stage IV ER positive, HER-2 positive carcinoma of the breast Bone metastases Widespread Brain Metastases Insomnia Anxiety UE DVT, RIJ and R subclavian Cancer related Fatigue Decline in EF, Herceptin held. Herceptin restarted on 04/12/2015 06/2014 progression of disease (bone only) with rise in tumor markers, abnormal PET Arimidex, herceptin, XGEVA neuropathy  Refill EMLA cream.  Zometa today for bone mets.   Continue xeloda/tykerb for palliative treatment of her breast cancer. She is still having side effects from the xeloda despite dose reduction, but seems to be doing somewhat better, continue current dose.   Plan to order restaging PET on her next visit.  She will return for follow up in 1 month.    NCCN guidelines recommends the  following for monitoring of metastatic breast cancer:  A. Components of monitoring:   1. Monitoring includes periodic assessment of varied combinations of symptoms, physical examination, routine laboratory tests, imaging studies, and blood biomarkers where appropriate. Results of monitoring are classified as response/continued response to treatment, stable disease, uncertainty regarding disease status, or progression of disease. The clinician typically must assess and balance multiple different forms of information to make a determination regarding whether disease is being controlled and the toxicity of treatment is acceptable. Sometimes, this information may be contradictory.  B. Definition of disease progression:   1. Unequivocal evidence of progression of disease by one or more of these factors is required to establish progression of disease, either because of ineffective therapy or acquired resistance of disease to an applied therapy.  Progression of disease may be identified through evidence of growth or worsening of disease at previously known sites of disease and/or of the occurrence of new sites  of metastatic disease.   2. Findings concerning for progression of disease include:    A. Worsening symptoms such as pain or dyspnea    B. Evidence of worsening or new disease on physical examination.    C. Declining performance status    D. Unexplained weight loss    E. Increasing Alkaline phosphatase, ALT, AST, or bilirubin    F. Hypercalcemia    G. New radiographic abnormality or increase in the size of pre-existing radiographic abnormality.    H. New areas of abnormality on functional imaging (eg, bone scan, PET/CT scan)    I. Increasing tumor markers (eg, CEA, CA 15-3, CA27.29)    All questions were answered. The patient knows to call the clinic with any problems, questions or concerns. We can certainly see the patient much sooner if necessary.   This document serves as a record of services personally performed by Twana First, MD. It was created on her behalf by Martinique Casey, a trained medical scribe. The creation of this record is based on the scribe's personal observations and the provider's statements to them. This document has been checked and approved by the attending provider.  I have reviewed the above documentation for accuracy and completeness, and I agree with the above.  This note was signed electronically.

## 2016-08-06 ENCOUNTER — Other Ambulatory Visit (HOSPITAL_COMMUNITY): Payer: Self-pay | Admitting: Oncology

## 2016-08-06 DIAGNOSIS — C7951 Secondary malignant neoplasm of bone: Secondary | ICD-10-CM

## 2016-08-06 DIAGNOSIS — Z0000 Encounter for general adult medical examination without abnormal findings: Secondary | ICD-10-CM

## 2016-08-06 DIAGNOSIS — C229 Malignant neoplasm of liver, not specified as primary or secondary: Secondary | ICD-10-CM

## 2016-08-06 DIAGNOSIS — R5382 Chronic fatigue, unspecified: Secondary | ICD-10-CM

## 2016-08-06 DIAGNOSIS — C50919 Malignant neoplasm of unspecified site of unspecified female breast: Secondary | ICD-10-CM

## 2016-08-06 LAB — CANCER ANTIGEN 27.29: CA 27.29: 46.3 U/mL — AB (ref 0.0–38.6)

## 2016-08-06 LAB — CANCER ANTIGEN 15-3: CAN 15 3: 37.9 U/mL — AB (ref 0.0–25.0)

## 2016-08-10 ENCOUNTER — Other Ambulatory Visit (HOSPITAL_COMMUNITY): Payer: Self-pay | Admitting: Oncology

## 2016-08-10 DIAGNOSIS — R5382 Chronic fatigue, unspecified: Secondary | ICD-10-CM

## 2016-08-10 DIAGNOSIS — C7951 Secondary malignant neoplasm of bone: Secondary | ICD-10-CM

## 2016-08-10 DIAGNOSIS — C50919 Malignant neoplasm of unspecified site of unspecified female breast: Secondary | ICD-10-CM

## 2016-08-10 DIAGNOSIS — Z0000 Encounter for general adult medical examination without abnormal findings: Secondary | ICD-10-CM

## 2016-08-10 DIAGNOSIS — C229 Malignant neoplasm of liver, not specified as primary or secondary: Secondary | ICD-10-CM

## 2016-08-10 MED ORDER — MORPHINE SULFATE ER 30 MG PO TBCR: 30.0000 mg | EXTENDED_RELEASE_TABLET | Freq: Two times a day (BID) | ORAL | 0 refills | Status: DC

## 2016-08-10 MED ORDER — OXYCODONE HCL 5 MG PO TABS: 5.0000 mg | ORAL_TABLET | ORAL | 0 refills | Status: AC | PRN

## 2016-08-12 ENCOUNTER — Other Ambulatory Visit (HOSPITAL_COMMUNITY): Payer: Self-pay | Admitting: Oncology

## 2016-08-12 DIAGNOSIS — C50919 Malignant neoplasm of unspecified site of unspecified female breast: Secondary | ICD-10-CM

## 2016-08-12 MED ORDER — XELODA 500 MG PO TABS: ORAL_TABLET | ORAL | 0 refills | Status: DC

## 2016-08-19 ENCOUNTER — Other Ambulatory Visit (HOSPITAL_COMMUNITY): Payer: Self-pay | Admitting: Hematology & Oncology

## 2016-08-19 ENCOUNTER — Other Ambulatory Visit (HOSPITAL_COMMUNITY): Payer: Self-pay | Admitting: Oncology

## 2016-08-19 DIAGNOSIS — E876 Hypokalemia: Secondary | ICD-10-CM

## 2016-08-19 DIAGNOSIS — R5382 Chronic fatigue, unspecified: Secondary | ICD-10-CM

## 2016-08-19 DIAGNOSIS — C229 Malignant neoplasm of liver, not specified as primary or secondary: Secondary | ICD-10-CM

## 2016-08-19 DIAGNOSIS — C50919 Malignant neoplasm of unspecified site of unspecified female breast: Secondary | ICD-10-CM

## 2016-08-19 DIAGNOSIS — G4700 Insomnia, unspecified: Secondary | ICD-10-CM

## 2016-09-01 ENCOUNTER — Encounter: Payer: Self-pay | Admitting: Podiatry

## 2016-09-02 ENCOUNTER — Encounter (HOSPITAL_COMMUNITY): Payer: Self-pay | Admitting: Adult Health

## 2016-09-02 ENCOUNTER — Encounter (HOSPITAL_BASED_OUTPATIENT_CLINIC_OR_DEPARTMENT_OTHER): Payer: 59

## 2016-09-02 ENCOUNTER — Encounter (HOSPITAL_COMMUNITY): Payer: 59 | Attending: Oncology | Admitting: Adult Health

## 2016-09-02 VITALS — BP 122/92 | HR 108 | Resp 16 | Ht 68.0 in | Wt 250.0 lb

## 2016-09-02 VITALS — BP 108/56 | HR 81 | Resp 18

## 2016-09-02 DIAGNOSIS — Z170 Estrogen receptor positive status [ER+]: Secondary | ICD-10-CM | POA: Diagnosis not present

## 2016-09-02 DIAGNOSIS — G893 Neoplasm related pain (acute) (chronic): Secondary | ICD-10-CM

## 2016-09-02 DIAGNOSIS — C50919 Malignant neoplasm of unspecified site of unspecified female breast: Secondary | ICD-10-CM

## 2016-09-02 DIAGNOSIS — R530 Neoplastic (malignant) related fatigue: Secondary | ICD-10-CM

## 2016-09-02 DIAGNOSIS — C787 Secondary malignant neoplasm of liver and intrahepatic bile duct: Secondary | ICD-10-CM

## 2016-09-02 DIAGNOSIS — C229 Malignant neoplasm of liver, not specified as primary or secondary: Secondary | ICD-10-CM | POA: Diagnosis not present

## 2016-09-02 DIAGNOSIS — Z0000 Encounter for general adult medical examination without abnormal findings: Secondary | ICD-10-CM

## 2016-09-02 DIAGNOSIS — R5382 Chronic fatigue, unspecified: Secondary | ICD-10-CM

## 2016-09-02 DIAGNOSIS — C7931 Secondary malignant neoplasm of brain: Secondary | ICD-10-CM

## 2016-09-02 DIAGNOSIS — C7951 Secondary malignant neoplasm of bone: Secondary | ICD-10-CM

## 2016-09-02 LAB — CBC WITH DIFFERENTIAL/PLATELET
BASOS ABS: 0 10*3/uL (ref 0.0–0.1)
Basophils Relative: 0 %
EOS PCT: 0 %
Eosinophils Absolute: 0 10*3/uL (ref 0.0–0.7)
HCT: 39.1 % (ref 36.0–46.0)
HEMOGLOBIN: 12.6 g/dL (ref 12.0–15.0)
LYMPHS PCT: 21 %
Lymphs Abs: 1.2 10*3/uL (ref 0.7–4.0)
MCH: 29.4 pg (ref 26.0–34.0)
MCHC: 32.2 g/dL (ref 30.0–36.0)
MCV: 91.1 fL (ref 78.0–100.0)
Monocytes Absolute: 0.5 10*3/uL (ref 0.1–1.0)
Monocytes Relative: 9 %
NEUTROS ABS: 3.9 10*3/uL (ref 1.7–7.7)
NEUTROS PCT: 70 %
PLATELETS: 239 10*3/uL (ref 150–400)
RBC: 4.29 MIL/uL (ref 3.87–5.11)
RDW: 17.7 % — ABNORMAL HIGH (ref 11.5–15.5)
WBC: 5.5 10*3/uL (ref 4.0–10.5)

## 2016-09-02 LAB — COMPREHENSIVE METABOLIC PANEL
ALT: 17 U/L (ref 14–54)
ANION GAP: 8 (ref 5–15)
AST: 22 U/L (ref 15–41)
Albumin: 3.9 g/dL (ref 3.5–5.0)
Alkaline Phosphatase: 91 U/L (ref 38–126)
BUN: 10 mg/dL (ref 6–20)
CALCIUM: 9.2 mg/dL (ref 8.9–10.3)
CO2: 29 mmol/L (ref 22–32)
CREATININE: 0.75 mg/dL (ref 0.44–1.00)
Chloride: 100 mmol/L — ABNORMAL LOW (ref 101–111)
Glucose, Bld: 124 mg/dL — ABNORMAL HIGH (ref 65–99)
Potassium: 3.6 mmol/L (ref 3.5–5.1)
Sodium: 137 mmol/L (ref 135–145)
Total Bilirubin: 0.4 mg/dL (ref 0.3–1.2)
Total Protein: 7.2 g/dL (ref 6.5–8.1)

## 2016-09-02 MED ORDER — MORPHINE SULFATE ER 30 MG PO TBCR: 30.0000 mg | EXTENDED_RELEASE_TABLET | Freq: Two times a day (BID) | ORAL | 0 refills | Status: DC

## 2016-09-02 MED ORDER — SODIUM CHLORIDE 0.9 % IV SOLN
INTRAVENOUS | Status: DC
  Administered 2016-09-02: 12:00:00 via INTRAVENOUS

## 2016-09-02 MED ORDER — METHYLPHENIDATE HCL 10 MG PO TABS: ORAL_TABLET | ORAL | 0 refills | Status: DC

## 2016-09-02 MED ORDER — HEPARIN SOD (PORK) LOCK FLUSH 100 UNIT/ML IV SOLN
500.0000 [IU] | Freq: Once | INTRAVENOUS | Status: AC
  Administered 2016-09-02: 500 [IU] via INTRAVENOUS

## 2016-09-02 MED ORDER — METHYLPHENIDATE HCL 20 MG PO TABS: 20.0000 mg | ORAL_TABLET | Freq: Two times a day (BID) | ORAL | 0 refills | Status: DC

## 2016-09-02 MED ORDER — ZOLEDRONIC ACID 4 MG/100ML IV SOLN
4.0000 mg | Freq: Once | INTRAVENOUS | Status: AC
Start: 2016-09-02 — End: 2016-09-02
  Administered 2016-09-02: 4 mg via INTRAVENOUS
  Filled 2016-09-02: qty 100

## 2016-09-02 MED ORDER — HEPARIN SOD (PORK) LOCK FLUSH 100 UNIT/ML IV SOLN
INTRAVENOUS | Status: AC
  Filled 2016-09-02: qty 5

## 2016-09-02 NOTE — Patient Instructions (Signed)
[facility] at [facility] Discharge Instructions  RECOMMENDATIONS MADE BY THE CONSULTANT AND ANY TEST RESULTS WILL BE SENT TO YOUR REFERRING PHYSICIAN.  You were seen today by Gretchen Dawson NP.   Thank you for choosing [facility] at [facility] to provide your oncology and hematology care.  To afford each patient quality time with our provider, please arrive at least 15 minutes before your scheduled appointment time.    If you have a lab appointment with the Cancer Center please come in thru the  Main Entrance and check in at the main information desk  You need to re-schedule your appointment should you arrive 10 or more minutes late.  We strive to give you quality time with our providers, and arriving late affects you and other patients whose appointments are after yours.  Also, if you no show three or more times for appointments you may be dismissed from the clinic at the providers discretion.     Again, thank you for choosing [facility].  Our hope is that these requests will decrease the amount of time that you wait before being seen by our physicians.       _____________________________________________________________  Should you have questions after your visit to [facility], please contact our office at [phone] between the hours of 8:30 a.m. and 4:30 p.m.  Voicemails left after 4:30 p.m. will not be returned until the following business day.  For prescription refill requests, have your pharmacy contact our office.       Resources For Cancer Patients and their Caregivers ? American Cancer Society: Can assist with transportation, wigs, general needs, runs Look Good Feel Better.        1-888-227-6333 ? Cancer Care: Provides financial assistance, online support groups, medication/co-pay assistance.  1-800-813-HOPE (4673) ? [facility] Assists [place] cancer patients and  their families through emotional , educational and financial support.  336-427-4357 ? [place] DSS Where to apply for food stamps, Medicaid and utility assistance. 336-342-1394 ? [facility]: Transportation to medical appointments. 336-347-2287 ? Social Security Administration: May apply for disability if have a Stage IV cancer. 336-342-7796 1-800-772-1213 ? [place] Aging, Disability and Transit Services: Assists with nutrition, care and transit needs. 336-349-2343  Cancer Center Support Programs: @10RELATIVEDAYS@ > Cancer Support Group  2nd Tuesday of the month 1pm-2pm, Journey Room  > Creative Journey  3rd Tuesday of the month 1130am-1pm, Journey Room  > Look Good Feel Better  1st Wednesday of the month 10am-12 noon, Journey Room (Call American Cancer Society to register 1-800-395-5775)    

## 2016-09-02 NOTE — Progress Notes (Signed)
zometa given today per orders. Patient tolerated it well, no issues. Vitals stable and discharged home from clinic ambulatory. Follow up as scheduled.

## 2016-09-02 NOTE — Patient Instructions (Signed)
[place] at [facility] Discharge Instructions  RECOMMENDATIONS MADE BY THE CONSULTANT AND ANY TEST RESULTS WILL BE SENT TO YOUR REFERRING PHYSICIAN.  Zometa given today. Follow up as scheduled.  Thank you for choosing [place] at [facility] to provide your oncology and hematology care.  To afford each patient quality time with our provider, please arrive at least 15 minutes before your scheduled appointment time.    If you have a lab appointment with the [facility] please come in thru the  Main Entrance and check in at the main information desk  You need to re-schedule your appointment should you arrive 10 or more minutes late.  We strive to give you quality time with our providers, and arriving late affects you and other patients whose appointments are after yours.  Also, if you no show three or more times for appointments you may be dismissed from the clinic at the providers discretion.     Again, thank you for choosing [facility].  Our hope is that these requests will decrease the amount of time that you wait before being seen by our physicians.       _____________________________________________________________  Should you have questions after your visit to [facility], please contact our office at [phone] between the hours of 8:30 a.m. and 4:30 p.m.  Voicemails left after 4:30 p.m. will not be returned until the following business day.  For prescription refill requests, have your pharmacy contact our office.       Resources For Cancer Patients and their Caregivers ? American [employer] Society: Can assist with transportation, wigs, general needs, runs Look Good Feel Better.        626 361 2458 ? Cancer Care: Provides financial assistance, online support groups, medication/co-pay assistance.  1-800-813-HOPE 571-861-3565) ? [facility] Assists [place] cancer patients and  their families through emotional , educational and financial support.  7573046210 ? [place] DSS Where to apply for food stamps, Medicaid and utility assistance. 201-661-3011 ? [facility]: Transportation to medical appointments. (769)882-9912 ? Social Security Administration: May apply for disability if have a Stage IV cancer. (808)091-8579 614 264 5345 ? [employer], Disability and Transit Services: Assists with nutrition, care and transit needs. [place] Support Programs: @10RELATIVEDAYS @ > Cancer Support Group  2nd Tuesday of the month 1pm-2pm, Journey Room  > Creative Journey  3rd Tuesday of the month 1130am-1pm, Journey Room  > Look Good Feel Better  1st Wednesday of the month 10am-12 noon, Journey Room (Call [place] to register [phone])

## 2016-09-02 NOTE — Progress Notes (Signed)
[place], [place] [address]   CLINIC:  Medical Oncology/Hematology  PCP:  [name], MD [place] [id number] [phone]   REASON FOR VISIT:  Follow-up for Stage IV metastatic breast adenocarcinoma with liver, bone, brain, and spine mets; ER+/HER2+   CURRENT THERAPY: Xeloda 2000 mg AM and 1500 mg PM, 7 days on/7 days off schedule with Tykerb daily & Zometa monthly   BRIEF ONCOLOGIC HISTORY:    Breast cancer, stage 4 ([place])   04/25/2014 Initial Diagnosis    Breast cancer, stage 4      04/25/2014 Imaging    CT abdomen/pelvis with widespread metastatic disease to the liver, multiple lytic lesions throughout spine and pelvis. No FX or epidural tumor identified      04/26/2014 Imaging    CT head unremarkable      04/26/2014 Imaging    CT chest with no lung mass or pulmonary nodules, no adenopathy. Lytic bone lesions, right 2nd rib      04/27/2014 Initial Biopsy    U/S guided liver biopsy, lesion in anterior and inferior left hepatic lobe biopsied      04/27/2014 Pathology Results    Metastatic adenocarcinoma, CK7, ER+, patchy positivity with PR. Possible primary includes breast, less likely gynecologic      05/15/2014 Mammogram    BI-RADS CATEGORY  2: Benign Finding(s)      05/16/2014 PET scan    1. Intensely hypermetabolic hepatic metastasis. 2. Widespread hypermetabolic skeletal lesions. 3. No primary adenocarcinoma identified by FDG PET imaging.      05/19/2014 Imaging    MUGA- Left ventricular ejection fracture greater than 70%.      05/21/2014 Breast MRI    No suspicious masses or enhancement within the breasts. No axillary adenopathy.      05/22/2014 - 07/03/2014 Antibody Plan    Herceptin/Perjeta/Tamoxifen      06/12/2014 - 07/03/2014 Chemotherapy    Taxotere added secondary to persistent abdominal and back pain      06/17/2014 - 06/19/2014 Hospital Admission    Neutropenia, fever, diarrhea, nausea,  vomiting      06/20/2014 - 07/10/2014 Radiation Therapy    Dr. Thea Silversmith 12 fractions to L3-S3 (30 Gy) and left scapula (20 Gy).       07/03/2014 Adverse Reaction    Perjeta- induced diarrhea.  Perjeta discontinued      07/16/2014 - 07/20/2014 Hospital Admission    Electrolyte abnormalities, and diarrhea.  Suspect Perjeta-induced diarrhea.  Negative GI work-up.      07/24/2014 - 08/19/2015 Chemotherapy    Herceptin/Tamoxifen/Xgeva      08/21/2014 Imaging    MUGA- Left ventricular ejection fraction equals 71%.      08/24/2014 PET scan    Dramatic reduction in metabolic activity of the widespread liver metastasis. Liver metastasis now have metabolic activity equal to background normal liver activity. Liver has a nodular contour. Marked reduction in metabolic activity of skeletal lesions..      10/05/2014 Progression    Widespread metastatic disease to the brain as described. Between 20 and 30 intracranial metastatic deposits are now seen. No midline shift or incipient herniation      10/09/2014 - 10/26/2014 Radiation Therapy    Whole Brain XRT      11/14/2014 Imaging    MUGA- LVEF 67%      02/13/2015 Imaging    MUGA- LVEF 59%      02/15/2015 Treatment Plan Change    Due to  declining LVEF, will hold Herceptin per PI guidelines.      04/12/2015 -  Chemotherapy    Herceptin restarted      06/02/2015 - 06/08/2015 Hospital Admission    Pneumonia      07/05/2015 Progression     PET/CT concern for mild progression of skeletal metastasis with several lesions within the spine and 1 lesion in the Left iliac wing with mild to moderate metabolic activity new from prior. Rising CA 27-29      07/16/2015 - 10/23/2015 Anti-estrogen oral therapy    Arimidex      07/16/2015 Imaging    MRI brain with satisfactory post treatment apperance of brain. interval resolved enhancing R caudate metastasis, minimal punctate residual enhancing metastatic disease at the inferior L cerebellum. No new  metastatic disease or new intracranial abnormality      07/19/2015 Treatment Plan Change    Discontinue Tamoxifen, Zoladex plus Arimidex.       08/27/2015 Procedure    Laparoscopic bilateral salpingo-oophorectomy by Dr. Gaetano Net      10/17/2015 PET scan    Osseous metastatic disease appears slightly progressive based on a new right scapular lesion and increased uptake within lesions in the thoracic spine, left iliac wing and  proximal right femur.      10/17/2015 Progression    Slight progression on PET scan imaging.      10/18/2015 Imaging    REsolved enhancing metastatic disease to the brain status post WBXRT      10/23/2015 - 02/12/2016 Adjuvant Chemotherapy    Faslodex loading followed by maintenance dose.  (Herceptin continued)      11/04/2015 Imaging    MUGA- LEFT ventricular ejection fraction 51% slightly decreased in a 57% on the previous exam.      12/24/2015 Treatment Plan Change    Zometa every 28 days.  Xgeva discontinued.        01/01/2016 Imaging    MUGA- Left ventricular ejection fraction equals 57.9%. This is increased from 51.1% previously.      02/03/2016 PET scan    1. Mixed metabolic changes in the scattered hypermetabolic sclerotic osseous metastases throughout the axial and proximal appendicular skeleton as detailed above. 2. No new sites of hypermetabolic metastatic disease. Stable pseudo-cirrhotic appearance of the liver due to treated liver metastases with no hypermetabolic liver metastases.       02/03/2016 Progression    PET shows mixed osseous response.  Some lesions more hypermetabolic, others improved.      02/05/2016 Treatment Plan Change    D/C Herceptin.  Faslodex as scheduled on 02/12/2016, then discontinued.  Continue Zometa.      02/05/2016 Treatment Plan Change    Prescriptions for Xeloda 7 days on and 7 days off and Tykerb printed and provided for authorization.      02/19/2016 -  Chemotherapy    Xeloda 2300 mg BID 7 days on and 7 days  off and Tykerb       02/24/2016 Treatment Plan Change    Xeloda dose reduced by 10% to 2000 mg BID week on and week off.      03/18/2016 Treatment Plan Change    Xeloda dose reduced to 2000 mg in AM and 1500 mg in PM 7 days on and 7 days off.      03/23/2016 Imaging    MUGA- Normal LEFT ventricular ejection fraction of 56% not significantly changed from 58% on previous exam.      05/19/2016 Imaging    MRI brain- .  No new focus of abnormal enhancement to suggest interval metastatic disease. 2. No acute intracranial abnormality. 3. Stable foci of T2 FLAIR hyperintensity in white matter and in the right caudate head compatible with posttreatment changes and treated metastasis.      05/20/2016 PET scan    1. The multiple osseous metastatic lesions shown to be hypermetabolic on the prior exam are reduced in activity or even resolved in hypermetabolic activity compared to prior, as detailed above. There are also numerous sclerotic bony lesions wedge are not currently and were not previously hypermetabolic, compatible with old non active lesions. 2. No findings of extra osseous metastatic disease currently. 3. Hepatic pseudocirrhosis. 4. Healing rib fractures. Chronic pathologic fracture the left acromion.      07/08/2016 Imaging    MUGA- Low normal LEFT ventricular ejection fraction of 53%, little changed since the 56% on 03/23/2016 but slightly decreased from the 58% on 01/01/2016.        INTERVAL HISTORY:  [name] 51 y.o. female presents for continued follow-up for metastatic breast cancer.   She is here today with her husband. Currently taking Xeloda 1500 mg in the AM and PM. She takes the Tykerb daily.  She is due for monthly Zometa today.    Fatigue is her biggest complaint today. She does not like how fatigued she feels during the "on weeks" of chemotherapy. Reports she is taking the 2 tabs of Ritalin in the morning, but sometimes forgets the mid-day dose.  She is  not able to enjoy spending time with her grandchildren during chemo weeks because she just feels too tired; this is a big quality of life dissatisfier for her.  She wants to know if she would be a candidate for "the 2 day IV chemo pump."  She tells me that she has a friend who was able to do the chemo pump and felt much better than she did on the Xeloda.  Currently rates her energy levels as 25%. Her appetite is slightly better at 50%.    Denies any rash, but does report "itching all of the time." Benadryl is helpful.  Reports chronic dyspnea on exertion; she also has some shortness of breath when trying to lay flat. "It gets better when I take a few deep breaths and try to relax."  Occasional nausea is noted; no vomiting. Denies headaches; she feels "unsteady on my feet" pretty often, but this is not a new complaint.    She has new pain to the top of her (L) foot; she is planning to see her podiatrist soon to try to help.   She is requesting refills on the long-acting morphine pills today. She very rarely requires a breakthrough oxycodone. Continues on gabapentin of peripheral neuropathy as well. Her bowels move regularly; denies any constipation, diarrhea, or blood in stools.  She would like to know if she can have a note from our office stating that it is safe for her to have a massage by an oncology-specialized masseuse.  She is also really looking forward to her son coming to town from [place] in May with her new grandbaby.    REVIEW OF SYSTEMS:  Review of Systems - Oncology, per HPI.   14-point ROS performed and negative except as noted above.    PAST MEDICAL/SURGICAL HISTORY:  Past Medical History:  Diagnosis Date  . Anticoagulated    xarelto  . Anxiety   . Breast cancer metastasized to multiple sites [facility])    liver, brain, and bone  .  Breast cancer, stage 4 [facility]) oncologist-  dr Larene [place] ([facility])   dx 12/ 2015 -- breast cancer Stage 4,  ER/HER2 +,  w/  liver, brain  and  bone mets/  chemotherapy and radiation therapy  . Chronic pain syndrome    secondary to cancer   . Depression   . Drug-induced cardiomyopathy ([place])    per last MUGA (08-08-2015), ef 56.5/ per last echo 05-18-2014 ef 60-65%  . Family history of prostate cancer   . GERD (gastroesophageal reflux disease)   . History of colon polyps    07-13-2013  benign  . History of DVT (deep vein thrombosis)    07-09-2014  upper right extremity-  RIJ and right subclavian--  resolved  . History of gastritis    erosive  . History of pneumonia    HCAP 06-07-2015--  resolved per cxr 07-04-2015  . History of radiation therapy    12 fractions to L3 - S3, 30Gy and left spacula 20Gy (06-20-2014 to 07-10-2014) //  whole brain rxt (10-09-2014 to 10-26-2014)  . History of small bowel obstruction    S/P RESECTION 2008  . Migraine   . PONV (postoperative nausea and vomiting)    pt states scope patch does well   Past Surgical History:  Procedure Laterality Date  . BREAST REDUCTION SURGERY  03/17/2011   Procedure: MAMMARY REDUCTION BILATERAL (BREAST);  Surgeon: Mary A Contogiannis;  Location: [facility];  Service: Plastics;  Laterality: Bilateral;  . CATARACT EXTRACTION W/ INTRAOCULAR LENS  IMPLANT, BILATERAL  2008  . CERVICAL FUSION  2003   C5 -- C6  . COLONOSCOPY N/A 07/13/2013   Procedure: COLONOSCOPY;  Surgeon: Rogene Houston, MD;  [facility];  Service: Endoscopy;  Laterality: N/A;  930  . COLONOSCOPY N/A 11/26/2014   Procedure: COLONOSCOPY;  Surgeon: Rogene Houston, MD;  [facility];  Service: Endoscopy;  Laterality: N/A;  730  . D & C HYSTEROSCOPY/  RESECTION ENDOMETRIAL MASS/  [place] ENDOMETRIAL ABLATION  04-11-2010  . DX LAPAROSCOPY W/ PARTIAL SMALL BOWEL RESECTION AND APPENDECTOMY  04-13-2007  . ESOPHAGOGASTRODUODENOSCOPY N/A 05/25/2014   Procedure: ESOPHAGOGASTRODUODENOSCOPY (EGD);  Surgeon: Rogene Houston, MD;  [facility];  Service: Endoscopy;   Laterality: N/A;  155  . KNEE ARTHROSCOPY Right 2005  . LAPAROSCOPIC ASSISTED VAGINAL HYSTERECTOMY  10-13-2010   w/ Bx Left Fallopian tube and Aspiration Right Ovarian Cyst  . LAPAROSCOPIC CHOLECYSTECTOMY  11-17-2002  . LAPAROSCOPIC SALPINGO OOPHERECTOMY Bilateral 08/26/2015   Procedure: LAPAROSCOPIC SALPINGO OOPHORECTOMY, bilateral;  Surgeon: Everlene Farrier, MD;  Location: [facility];  Service: Gynecology;  Laterality: Bilateral;  . PORTACATH PLACEMENT  05-17-2014  . REDUCTION MAMMAPLASTY    . SHOULDER ARTHROSCOPY WITH ROTATOR CUFF REPAIR Right 2002  . TRANSTHORACIC ECHOCARDIOGRAM  05-18-2014   ef 60-65%//   last MUGA  (08-08-2015)  ef 56.6%       SOCIAL HISTORY:  Social History   Social History  . Marital status: Married    Spouse name: N/A  . Number of children: N/A  . Years of education: N/A   Occupational History  . Not on file.   Social History Main Topics  . Smoking status: Former Smoker    Types: Cigarettes    Quit date: 08/20/1994  . Smokeless tobacco: Never Used  . Alcohol use Yes     Comment: Occasionally  . Drug use: No  . Sexual activity: Not on file   Other Topics  Concern  . Not on file   Social History Narrative  . No narrative on file    FAMILY HISTORY:  Family History  Problem Relation Age of Onset  . Diabetes Father   . Heart attack Maternal Grandmother 30    multiple over lifetime.  . Cancer Maternal Grandmother 58    NOS  . Prostate cancer Maternal Grandfather     dx in his 63s  . Lung cancer Paternal Grandfather     dx <50  . Lymphoma Maternal Aunt     dx in her 25s  . Melanoma Cousin 27    maternal first cousin  . Brain cancer Cousin     paternal first cousin dx under 76  . Prostate cancer Other     MGF's father  . Colon cancer Other     MGM's mother    CURRENT MEDICATIONS:  Outpatient Encounter Prescriptions as of 09/02/2016  Medication Sig  . acetaminophen (TYLENOL) 325 MG tablet Take 650 mg by mouth every 6  (six) hours as needed for moderate pain or headache.  . Calcium-Phosphorus-Vitamin D (CALCIUM/D3 ADULT GUMMIES PO) Take 2 tablets by mouth 2 (two) times daily. dosage of calcium 1258m and vitamin d 10057m . diphenhydrAMINE (BENADRYL) 25 mg capsule Take 25 mg by mouth every 6 (six) hours as needed for itching.  . furosemide (LASIX) 20 MG tablet TAKE 1 TABLET BY MOUTH TWICE DAILY  . lapatinib (TYKERB) 250 MG tablet Take 5 tablets (1,250 mg total) by mouth daily.  . Marland Kitchenidocaine-prilocaine (EMLA) cream Apply 1 application topically daily as needed (apply to port before chemo).  . LORazepam (ATIVAN) 0.5 MG tablet Take 1 tablet (0.5 mg total) by mouth every 6 (six) hours as needed for anxiety. Reported on 08/16/2015  . magic mouthwash SOLN Swish and swallow 71m32mour times a day.  . Melatonin 10 MG CAPS Take 1 tablet by mouth. Pt states she takes one tablet at night for sleep  . morphine (MS CONTIN) 30 MG 12 hr tablet Take 1 tablet (30 mg total) by mouth every 12 (twelve) hours.  . ondansetron (ZOFRAN) 8 MG tablet Take 1 tablet (8 mg total) by mouth every 8 (eight) hours as needed for nausea or vomiting.  . oMarland KitchenyCODONE (OXY IR/ROXICODONE) 5 MG immediate release tablet Take 1-2 tablets (5-10 mg total) by mouth every 4 (four) hours as needed for moderate pain.  . pantoprazole (PROTONIX) 40 MG tablet TAKE 1 TABLET BY MOUTH EVERY DAY EMERGENCY REFILL FAXED DR  . potassium chloride SA (K-DUR,KLOR-CON) 20 MEQ tablet TAKE 1 TABLET BY MOUTH TWICE DAILY  . promethazine (PHENERGAN) 25 MG tablet Take 1 tablet (25 mg total) by mouth every 6 (six) hours as needed for nausea or vomiting.  . silver sulfADIAZINE (SILVADENE) 1 % cream Apply 1 application topically daily.  . SUMAtriptan (IMITREX) 100 MG tablet Take 1 tablet (100 mg total) by mouth as needed. foir migraine  . venlafaxine XR (EFFEXOR-XR) 75 MG 24 hr capsule TAKE 2 CAPSULES BY MOUTH EVERY DAY WITH BREAKFAST  . XARELTO 20 MG TABS tablet TAKE 1 TABLET BY MOUTH  EVERY DAY WITH SUPPER  . XELODA 500 MG tablet Take 2000 mg PO in AM and 1500 mg PO in PM daily, 7 days on and 7 days off  . zolpidem (AMBIEN) 10 MG tablet TAKE 1 TABLET BY MOUTH AT BEDTIME AS NEEDED  . [DISCONTINUED] gabapentin (NEURONTIN) 300 MG capsule Take 1 capsule (300 mg total) by mouth at bedtime. (Patient taking  differently: Take 300 mg by mouth 2 (two) times daily. )  . [DISCONTINUED] methylphenidate (RITALIN) 10 MG tablet Take 2 in the morning and 1 at lunch  . [DISCONTINUED] methylphenidate (RITALIN) 10 MG tablet Take 2 in the morning and 1 at lunch  . [DISCONTINUED] morphine (MS CONTIN) 30 MG 12 hr tablet Take 1 tablet (30 mg total) by mouth every 12 (twelve) hours.  . methylphenidate (RITALIN) 20 MG tablet Take 1 tablet (20 mg total) by mouth 2 (two) times daily. Take 1 tab in morning and 1 tab at lunch/before 1pm.   No facility-administered encounter medications on file as of 09/02/2016.     ALLERGIES:  No Known Allergies   PHYSICAL EXAM:  ECOG Performance status: 1 - Symptomatic, but independent.   Vitals:   09/02/16 1131  BP: (!) 122/92  Pulse: (!) 108  Resp: 16   Filed Weights   09/02/16 1131  Weight: 250 lb (113.4 kg)    Physical Exam  Constitutional: She is oriented to person, place, and time and well-developed, well-nourished, and in no distress.  HENT:  Head: Normocephalic.  Mouth/Throat: Oropharynx is clear and moist. No oropharyngeal exudate.  Eyes: Conjunctivae are normal. Pupils are equal, round, and reactive to light. No scleral icterus.  Neck: Normal range of motion. Neck supple.  Cardiovascular: Regular rhythm.   Mild tachycardia  Pulmonary/Chest: Effort normal and breath sounds normal. No respiratory distress.  Abdominal: Soft. Bowel sounds are normal. There is no tenderness.  Musculoskeletal: Normal range of motion. She exhibits no edema.  Lymphadenopathy:    She has no cervical adenopathy.       Right: No supraclavicular adenopathy present.        Left: No supraclavicular adenopathy present.  Neurological: She is alert and oriented to person, place, and time. No cranial nerve deficit. Gait normal.  Skin: Skin is warm and dry. No rash noted.  Psychiatric: Mood, memory, affect and judgment normal.  Nursing note and vitals reviewed.    LABORATORY DATA:  I have reviewed the labs as listed.  CBC    Component Value Date/Time   WBC 5.5 09/02/2016 1130   RBC 4.29 09/02/2016 1130   HGB 12.6 09/02/2016 1130   HCT 39.1 09/02/2016 1130   PLT 239 09/02/2016 1130   MCV 91.1 09/02/2016 1130   MCH 29.4 09/02/2016 1130   MCHC 32.2 09/02/2016 1130   RDW 17.7 (H) 09/02/2016 1130   LYMPHSABS 1.2 09/02/2016 1130   MONOABS 0.5 09/02/2016 1130   EOSABS 0.0 09/02/2016 1130   BASOSABS 0.0 09/02/2016 1130   CMP Latest Ref Rng & Units 09/02/2016 08/05/2016 07/08/2016  Glucose 65 - 99 mg/dL 124(H) 140(H) 130(H)  BUN 6 - 20 mg/dL '10 9 14  ' Creatinine 0.44 - 1.00 mg/dL 0.75 0.88 0.76  Sodium 135 - 145 mmol/L 137 139 136  Potassium 3.5 - 5.1 mmol/L 3.6 3.3(L) 3.7  Chloride 101 - 111 mmol/L 100(L) 100(L) 98(L)  CO2 22 - 32 mmol/L '29 31 30  ' Calcium 8.9 - 10.3 mg/dL 9.2 9.4 9.2  Total Protein 6.5 - 8.1 g/dL 7.2 7.1 7.3  Total Bilirubin 0.3 - 1.2 mg/dL 0.4 0.3 0.2(L)  Alkaline Phos 38 - 126 U/L 91 86 83  AST 15 - 41 U/L '22 23 20  ' ALT 14 - 54 U/L '17 15 15    ' PENDING LABS:  CA 25-3, CA 27.29 pending.    DIAGNOSTIC IMAGING:  PET scan: 05/20/16     MUGA scan: 07/08/16  PATHOLOGY:  [facility]: 05/25/14     ASSESSMENT & PLAN:   Stage IV metastatic breast adenocarcinoma with liver, bone, brain, and spine mets; ER+/HER2+: -Currently taking Xeloda 1500 mg BID with Tykerb daily. Despite dose-reduction, her QOL continues to be impacted in terms of fatigue. I will discuss with Dr. Talbert Cage and investigate to see if she may be eligible for infusional 5-FU vs Xeloda. We discussed that Xeloda is essentially pill-form of 5-FU, so the side  effect profile is similar in some ways.  Her hope is to have "more good days than bad days" in terms of her symptoms, which is certainly reasonable.  Recommended we wait until after next restaging PET scan before making any treatment changes and she agrees.  -PET scan due in 09/2016; orders placed today to try to get scan done sometime within the next 2-3 weeks.  -Return to cancer center in 1 month for continued follow-up and to review restaging scans.   Cardiotoxic chemotherapy (Tykerb):  -Last MUGA with EF 56% in 06/2016.  -Her symptoms of shortness of breath when lying down to sleep sound more consistent with anxiety, but we will certainly continue to monitor. She is taking the Lasix at least once daily; sometimes she forgets to take second dose in the afternoon (current prescribed 20 mg BID). So that she gets adequate diuresis, recommended she just take 40 mg each morning of the Lasix.  -Repeat/routine MUGA re-imaging is due in 09/2016; orders placed today. Will try to get scan done before her next follow-up visit in 1 month.    Bone mets:  -Continue monthly Zometa.  -Due today; calcium reviewed and adequate for administration.   Cancer-related fatigue:  -Will increase her Ritalin daily dose by 10 mg total daily dose today. I do not want to make aggressive increase in dose given mild tachycardia and cardiotoxic-potential chemotherapy. New prescription for Ritalin 20 mg po BID (in AM and at lunchtime) given to patient. Will re-assess her symptoms at next visit.    Chronic pain secondary to neoplasm:  -MS Contin effective in managing her pain.  MS Contin refilled and paper prescription given to patient.  -Continue oxycodone for breakthrough pain PRN.  -Recommended continued adequate bowel regimen to prevent opiate-induced constipation.      Letter request for massage:  -Labs reviewed and she is not neutropenic or thrombocytopenic.  -Letter given to patient stating that it was safe from an  oncologic standpoint for her to have a massage as often as needed.     Dispo:  -PET scan and MUGA scan sometime within the next 2-3 weeks.  -Return to cancer center in 1 month for follow-up visit and next Zometa infusion.    All questions were answered to patient's stated satisfaction. Encouraged patient to call with any new concerns or questions before her next visit to the cancer center and we can certain see her sooner, if needed.    Plan of care discussed with Dr. Talbert Cage, who agrees with the above aforementioned.    Orders placed this encounter:  Orders Placed This Encounter  Procedures  . NM Cardiac [place]uga Rest  . NM PET Image Restag (PS) Skull Base To Thigh  . CBC with Differential/Platelet  . Comprehensive metabolic panel  . Cancer antigen 27.29  . Cancer antigen 15-3      Mike Craze, NP [place] [phone]

## 2016-09-02 NOTE — Progress Notes (Signed)
   [place] [address] [place], [place] [address]   09/02/16   To Whom It May Concern:    Mrs. [name], (Date of Birth: Nov 06, 1965) is currently undergoing treatment for breast cancer at [facility].  From an oncologic standpoint, it is safe for her to have massage therapy as often as deemed clinically appropriate.  Please feel free to contact our office with any questions or concerns.     Best regards,     Mike Craze, NP [place] [phone]

## 2016-09-04 MED ORDER — GABAPENTIN 300 MG PO CAPS: 300.0000 mg | ORAL_CAPSULE | Freq: Two times a day (BID) | ORAL | 1 refills | Status: DC

## 2016-09-04 NOTE — Addendum Note (Signed)
Addended by: Gerhard Perches on: 09/04/2016 11:54 AM   Modules accepted: Orders

## 2016-09-07 ENCOUNTER — Ambulatory Visit (HOSPITAL_COMMUNITY)
Admission: RE | Admit: 2016-09-07 | Discharge: 2016-09-07 | Disposition: A | Discharge status: Home / Self Care | Payer: 59 | Source: Ambulatory Visit | Attending: Adult Health | Admitting: Adult Health

## 2016-09-07 ENCOUNTER — Other Ambulatory Visit (HOSPITAL_COMMUNITY): Payer: Self-pay | Admitting: Oncology

## 2016-09-07 ENCOUNTER — Encounter (HOSPITAL_COMMUNITY): Payer: Self-pay

## 2016-09-07 DIAGNOSIS — C50919 Malignant neoplasm of unspecified site of unspecified female breast: Secondary | ICD-10-CM

## 2016-09-07 MED ORDER — XELODA 500 MG PO TABS: ORAL_TABLET | ORAL | 0 refills | Status: DC

## 2016-09-07 MED ORDER — TECHNETIUM TC 99M-LABELED RED BLOOD CELLS IV KIT
20.0000 | PACK | Freq: Once | INTRAVENOUS | Status: AC | PRN
  Administered 2016-09-07: 21 via INTRAVENOUS

## 2016-09-07 MED ORDER — HEPARIN SOD (PORK) LOCK FLUSH 100 UNIT/ML IV SOLN
INTRAVENOUS | Status: AC
  Filled 2016-09-07: qty 5

## 2016-09-07 MED ORDER — LAPATINIB DITOSYLATE 250 MG PO TABS: 1250.0000 mg | ORAL_TABLET | Freq: Every day | ORAL | 1 refills | Status: DC

## 2016-09-08 ENCOUNTER — Other Ambulatory Visit (HOSPITAL_COMMUNITY): Payer: Self-pay | Admitting: *Deleted

## 2016-09-08 DIAGNOSIS — C50919 Malignant neoplasm of unspecified site of unspecified female breast: Secondary | ICD-10-CM

## 2016-09-10 ENCOUNTER — Ambulatory Visit (HOSPITAL_COMMUNITY): Payer: 59

## 2016-09-15 ENCOUNTER — Ambulatory Visit (HOSPITAL_COMMUNITY)
Admission: RE | Admit: 2016-09-15 | Discharge: 2016-09-15 | Disposition: A | Discharge status: Home / Self Care | Payer: 59 | Source: Ambulatory Visit | Attending: Adult Health | Admitting: Adult Health

## 2016-09-15 DIAGNOSIS — C7951 Secondary malignant neoplasm of bone: Secondary | ICD-10-CM | POA: Diagnosis not present

## 2016-09-15 DIAGNOSIS — C50919 Malignant neoplasm of unspecified site of unspecified female breast: Secondary | ICD-10-CM | POA: Insufficient documentation

## 2016-09-15 LAB — GLUCOSE, CAPILLARY: GLUCOSE-CAPILLARY: 113 mg/dL — AB (ref 65–99)

## 2016-09-15 MED ORDER — FLUDEOXYGLUCOSE F - 18 (FDG) INJECTION
12.3800 | Freq: Once | INTRAVENOUS | Status: AC | PRN
  Administered 2016-09-15: 12.38 via INTRAVENOUS

## 2016-09-18 ENCOUNTER — Telehealth (HOSPITAL_COMMUNITY): Payer: Self-pay | Admitting: Adult Health

## 2016-09-18 NOTE — Telephone Encounter (Signed)
I called and spoke directly with [name] to discuss her recent PET scan results.    Shared with her that there are 3 noted areas with higher FDG uptake in rib, left hip, and right hip. There are no other areas of metastatic disease in any solid organs and no new bony lesions.  She voiced understanding and appreciation for the call.    She did share with me that she recently had an oncology-specialist massage and tells me, "[name], I haven't felt that good in over 2 years." She is planning on having these massages once per month.  I shared with her how happy I am that she has found something that helps her feel better and offered whatever support she needs from [place] (I gave her a letter for this specialist "clearing" her for massages as often as they deem necessary).    We will see her in follow-up in a few weeks here at the cancer center. She knows to call us with any other questions/problems.  We will discuss at that time if there needs to be any change in her therapy. She is interested in considering IV therapy (5-FU), but will talk with [place] about more at her next visit.    Mike Craze, NP [place] [phone]

## 2016-09-28 ENCOUNTER — Telehealth (HOSPITAL_COMMUNITY): Payer: Self-pay

## 2016-09-28 ENCOUNTER — Emergency Department (HOSPITAL_COMMUNITY): Payer: 59

## 2016-09-28 ENCOUNTER — Emergency Department (HOSPITAL_COMMUNITY)
Admission: EM | Admit: 2016-09-28 | Discharge: 2016-09-28 | Disposition: A | Discharge status: Home / Self Care | Payer: 59 | Attending: Emergency Medicine | Admitting: Emergency Medicine

## 2016-09-28 ENCOUNTER — Other Ambulatory Visit: Payer: Self-pay

## 2016-09-28 ENCOUNTER — Encounter (HOSPITAL_COMMUNITY): Payer: Self-pay | Admitting: Emergency Medicine

## 2016-09-28 DIAGNOSIS — Z79899 Other long term (current) drug therapy: Secondary | ICD-10-CM | POA: Insufficient documentation

## 2016-09-28 DIAGNOSIS — Z85841 Personal history of malignant neoplasm of brain: Secondary | ICD-10-CM | POA: Insufficient documentation

## 2016-09-28 DIAGNOSIS — R079 Chest pain, unspecified: Secondary | ICD-10-CM | POA: Diagnosis not present

## 2016-09-28 DIAGNOSIS — Z85830 Personal history of malignant neoplasm of bone: Secondary | ICD-10-CM | POA: Insufficient documentation

## 2016-09-28 DIAGNOSIS — Z87891 Personal history of nicotine dependence: Secondary | ICD-10-CM | POA: Diagnosis not present

## 2016-09-28 DIAGNOSIS — Z853 Personal history of malignant neoplasm of breast: Secondary | ICD-10-CM | POA: Insufficient documentation

## 2016-09-28 LAB — CBC WITH DIFFERENTIAL/PLATELET
BASOS ABS: 0 10*3/uL (ref 0.0–0.1)
BASOS PCT: 0 %
EOS PCT: 0 %
Eosinophils Absolute: 0 10*3/uL (ref 0.0–0.7)
HCT: 36.6 % (ref 36.0–46.0)
Hemoglobin: 11.9 g/dL — ABNORMAL LOW (ref 12.0–15.0)
LYMPHS PCT: 22 %
Lymphs Abs: 1.1 10*3/uL (ref 0.7–4.0)
MCH: 29.7 pg (ref 26.0–34.0)
MCHC: 32.5 g/dL (ref 30.0–36.0)
MCV: 91.3 fL (ref 78.0–100.0)
MONO ABS: 0.3 10*3/uL (ref 0.1–1.0)
MONOS PCT: 6 %
NEUTROS ABS: 3.6 10*3/uL (ref 1.7–7.7)
Neutrophils Relative %: 72 %
PLATELETS: 211 10*3/uL (ref 150–400)
RBC: 4.01 MIL/uL (ref 3.87–5.11)
RDW: 17.6 % — AB (ref 11.5–15.5)
WBC: 5 10*3/uL (ref 4.0–10.5)

## 2016-09-28 LAB — BASIC METABOLIC PANEL
Anion gap: 8 (ref 5–15)
BUN: 12 mg/dL (ref 6–20)
CALCIUM: 9.2 mg/dL (ref 8.9–10.3)
CO2: 32 mmol/L (ref 22–32)
CREATININE: 0.75 mg/dL (ref 0.44–1.00)
Chloride: 100 mmol/L — ABNORMAL LOW (ref 101–111)
GFR calc Af Amer: >60 mL/min (ref >60)
GLUCOSE: 112 mg/dL — AB (ref 65–99)
Potassium: 3.9 mmol/L (ref 3.5–5.1)
Sodium: 140 mmol/L (ref 135–145)

## 2016-09-28 LAB — TROPONIN I: Troponin I: <0.03 ng/mL (ref <0.03)

## 2016-09-28 LAB — D-DIMER, QUANTITATIVE: D-Dimer, Quant: <0.27 ug/mL-FEU (ref 0.00–0.50)

## 2016-09-28 MED ORDER — ACETAMINOPHEN 325 MG PO TABS
650.0000 mg | ORAL_TABLET | Freq: Once | ORAL | Status: AC
  Administered 2016-09-28: 650 mg via ORAL
  Filled 2016-09-28: qty 2

## 2016-09-28 NOTE — Discharge Instructions (Signed)
Pending your chest has many potential causes.  There was no sign of cardiac or pulmonary problems today.  In case of possible esophageal reflux, take an antacid before meals and at bedtime for 1 week.  Also, try using heat on the sore area 3 or 4 times a day.  Return here, if needed, for problems.

## 2016-09-28 NOTE — ED Provider Notes (Signed)
[facility] DEPT Provider Note   CSN: [id number] Arrival date & time: 09/28/16  1148  By signing my name below, I, [name], attest that this documentation has been prepared under the direction and in the presence of Daleen Bo, MD. Electronically Signed: Ethelle Lyon [name], Scribe. 09/28/2016. 12:25 PM.  History   Chief Complaint Chief Complaint  Patient presents with  . Chest Pain   The history is provided by the patient and medical records. No language interpreter was used.    HPI Comments:  [name] is a 51 y.o. female with a PMHx of Drug Induced Cardiomyopathy, Depression, Migraines, Anxiety, GERD, Breast CA, DVT, SBO, and Colon Polyps, who presents to the Emergency Department complaining of sudden onset, radiating, centralized, CP beginning while lying down this morning at 10:00AM. Pt reports lying down in bed when the spontaneous centralized, CP arose, radiating into her bilateral jaws. This leead her to call her doctor's office who referred her come in to the [facility]. She notes the pain has improved somewhat currently but the associated "extreme" nausea persists. She quantified the initial onset of her pain at 8/10 painful (now at a 2/10) and described as "a ton of bricks was on my chest." She did not eat prior to the onset of her symptoms. She has associated symptoms of room spinning dizziness upon standing, HA, SOB when lying supine, and some recent, mild epistaxis. Her husband notes her she has reduced PO intake as of late d/t poor appetite. She tried Tums at home without any relief. She takes a daily Protonix for persistent heartburn. She currently takes chemo pills at home alongside a Zometa infusion. Pt denies emesis, and any other complaints at this time.  Past Medical History:  Diagnosis Date  . Anticoagulated    xarelto  . Anxiety   . Breast cancer metastasized to multiple sites [facility])    liver, brain, and bone  . Breast cancer, stage 4 [facility]) oncologist-  dr  Larene Beach penland ([facility])   dx 12/ 2015 -- breast cancer Stage 4,  ER/HER2 +,  w/  liver, brain and  bone mets/  chemotherapy and radiation therapy  . Chronic pain syndrome    secondary to cancer   . Depression   . Drug-induced cardiomyopathy ([place])    per last MUGA (08-08-2015), ef 56.5/ per last echo 05-18-2014 ef 60-65%  . Family history of prostate cancer   . GERD (gastroesophageal reflux disease)   . History of colon polyps    07-13-2013  benign  . History of DVT (deep vein thrombosis)    07-09-2014  upper right extremity-  RIJ and right subclavian--  resolved  . History of gastritis    erosive  . History of pneumonia    HCAP 06-07-2015--  resolved per cxr 07-04-2015  . History of radiation therapy    12 fractions to L3 - S3, 30Gy and left spacula 20Gy (06-20-2014 to 07-10-2014) //  whole brain rxt (10-09-2014 to 10-26-2014)  . History of small bowel obstruction    S/P RESECTION 2008  . Migraine   . PONV (postoperative nausea and vomiting)    pt states scope patch does well   Patient Active Problem List   Diagnosis Date Noted  . Mouth sores 03/18/2016  . Esophageal reflux   . Metastatic breast cancer ([place])   . HCAP (healthcare-associated pneumonia) 06/02/2015  . Pneumonia 06/02/2015  . Drug-induced cardiomyopathy ([place]) 02/15/2015  . Brain metastases ([place]) 01/29/2015  . Genetic testing 09/05/2014  .  Family history of prostate cancer   . GERD (gastroesophageal reflux disease) 07/16/2014  . DVT (deep venous thrombosis) ([place]) 07/09/2014  . Bone metastases ([place]) 05/23/2014  . Breast cancer, stage 4 ([name]) 05/16/2014  . Hyperlipidemia 04/23/2014  . Depression 04/23/2014    Past Surgical History:  Procedure Laterality Date  . BREAST REDUCTION SURGERY  03/17/2011   Procedure: MAMMARY REDUCTION BILATERAL (BREAST);  Surgeon: Mary A Contogiannis;  Location: [facility];  Service: Plastics;  Laterality: Bilateral;  . CATARACT EXTRACTION W/ INTRAOCULAR LENS   IMPLANT, BILATERAL  2008  . CERVICAL FUSION  2003   C5 -- C6  . COLONOSCOPY N/A 07/13/2013   Procedure: COLONOSCOPY;  Surgeon: Rogene Houston, MD;  [facility];  Service: Endoscopy;  Laterality: N/A;  930  . COLONOSCOPY N/A 11/26/2014   Procedure: COLONOSCOPY;  Surgeon: Rogene Houston, MD;  [facility];  Service: Endoscopy;  Laterality: N/A;  730  . D & C HYSTEROSCOPY/  RESECTION ENDOMETRIAL MASS/  [place] ENDOMETRIAL ABLATION  04-11-2010  . DX LAPAROSCOPY W/ PARTIAL SMALL BOWEL RESECTION AND APPENDECTOMY  04-13-2007  . ESOPHAGOGASTRODUODENOSCOPY N/A 05/25/2014   Procedure: ESOPHAGOGASTRODUODENOSCOPY (EGD);  Surgeon: Rogene Houston, MD;  [facility];  Service: Endoscopy;  Laterality: N/A;  155  . KNEE ARTHROSCOPY Right 2005  . LAPAROSCOPIC ASSISTED VAGINAL HYSTERECTOMY  10-13-2010   w/ Bx Left Fallopian tube and Aspiration Right Ovarian Cyst  . LAPAROSCOPIC CHOLECYSTECTOMY  11-17-2002  . LAPAROSCOPIC SALPINGO OOPHERECTOMY Bilateral 08/26/2015   Procedure: LAPAROSCOPIC SALPINGO OOPHORECTOMY, bilateral;  Surgeon: Everlene Farrier, MD;  Location: [facility];  Service: Gynecology;  Laterality: Bilateral;  . PORTACATH PLACEMENT  05-17-2014  . REDUCTION MAMMAPLASTY    . SHOULDER ARTHROSCOPY WITH ROTATOR CUFF REPAIR Right 2002  . TRANSTHORACIC ECHOCARDIOGRAM  05-18-2014   ef 60-65%//   last MUGA  (08-08-2015)  ef 56.6%     OB History    No data available     Home Medications    Prior to Admission medications   Medication Sig Start Date End Date Taking? Authorizing Provider  acetaminophen (TYLENOL) 325 MG tablet Take 650 mg by mouth every 6 (six) hours as needed for moderate pain or headache.   Yes [provider]  calcium carbonate (TUMS - DOSED IN MG ELEMENTAL CALCIUM) 500 MG chewable tablet Chew 1 tablet by mouth daily.   Yes [provider]  Calcium-Phosphorus-Vitamin D (CALCIUM/D3 ADULT GUMMIES PO) Take 2 tablets by  mouth 2 (two) times daily. dosage of calcium 1270m and vitamin d 10059m  Yes [provider]  diphenhydrAMINE (BENADRYL) 25 mg capsule Take 25 mg by mouth every 6 (six) hours as needed for itching.   Yes [provider]  furosemide (LASIX) 20 MG tablet TAKE 1 TABLET BY MOUTH TWICE DAILY 08/19/16  Yes Kefalas, ThManon HildingPA-C  gabapentin (NEURONTIN) 300 MG capsule Take 1 capsule (300 mg total) by mouth 2 (two) times daily. 09/04/16  Yes ZhTwana FirstMD  lapatinib (TYKERB) 250 MG tablet Take 5 tablets (1,250 mg total) by mouth daily. 09/07/16  Yes KeBaird CancerPA-C  lidocaine-prilocaine (EMLA) cream Apply 1 application topically daily as needed (apply to port before chemo). 08/05/16  Yes ZhTwana FirstMD  LORazepam (ATIVAN) 0.5 MG tablet Take 1 tablet (0.5 mg total) by mouth every 6 (six) hours as needed for anxiety. Reported on 08/16/2015 07/24/16  Yes Kefalas, ThManon HildingPA-C  magic mouthwash SOLN Swish and swallow 31m631m  four times a day. 03/18/16  Yes Penland, Kelby Fam, MD  Melatonin 10 MG CAPS Take 1 tablet by mouth. Pt states she takes one tablet at night for sleep   Yes [provider]  methylphenidate (RITALIN) 20 MG tablet Take 1 tablet (20 mg total) by mouth 2 (two) times daily. Take 1 tab in morning and 1 tab at lunch/before 1pm. 09/02/16  Yes Holley Bouche, NP  morphine (MS CONTIN) 30 MG 12 hr tablet Take 1 tablet (30 mg total) by mouth every 12 (twelve) hours. 09/02/16  Yes Holley Bouche, NP  ondansetron (ZOFRAN) 8 MG tablet Take 1 tablet (8 mg total) by mouth every 8 (eight) hours as needed for nausea or vomiting. 02/05/16  Yes Kefalas, Manon Hilding, PA-C  oxyCODONE (OXY IR/ROXICODONE) 5 MG immediate release tablet Take 1-2 tablets (5-10 mg total) by mouth every 4 (four) hours as needed for moderate pain. 08/10/16  Yes Kefalas, Manon Hilding, PA-C  pantoprazole (PROTONIX) 40 MG tablet TAKE 1 TABLET BY MOUTH EVERY DAY EMERGENCY REFILL FAXED DR 07/22/16  Yes Baird Cancer, PA-C  potassium chloride SA (K-DUR,KLOR-CON) 20 MEQ tablet TAKE 1 TABLET BY MOUTH TWICE DAILY 08/19/16  Yes Kefalas, Manon Hilding, PA-C  promethazine (PHENERGAN) 25 MG tablet Take 1 tablet (25 mg total) by mouth every 6 (six) hours as needed for nausea or vomiting. 02/05/16  Yes Kefalas, Manon Hilding, PA-C  silver sulfADIAZINE (SILVADENE) 1 % cream Apply 1 application topically daily. 04/15/16  Yes Penland, Kelby Fam, MD  venlafaxine XR (EFFEXOR-XR) 75 MG 24 hr capsule TAKE 2 CAPSULES BY MOUTH EVERY DAY WITH BREAKFAST 06/23/16  Yes Kefalas, Manon Hilding, PA-C  XARELTO 20 MG TABS tablet TAKE 1 TABLET BY MOUTH EVERY DAY WITH SUPPER 08/19/16  Yes Baird Cancer, PA-C  XELODA 500 MG tablet Take 1500 mg PO BID, 7 days on and 7 days off 09/07/16  Yes Kefalas, Manon Hilding, PA-C  zolpidem (AMBIEN) 10 MG tablet TAKE 1 TABLET BY MOUTH AT BEDTIME AS NEEDED 08/19/16  Yes Kefalas, Manon Hilding, PA-C  SUMAtriptan (IMITREX) 100 MG tablet Take 1 tablet (100 mg total) by mouth as needed. foir migraine 07/03/14   Penland, Kelby Fam, MD    Family History Family History  Problem Relation Age of Onset  . Diabetes Father   . Heart attack Maternal Grandmother 30       multiple over lifetime.  . Cancer Maternal Grandmother 73       NOS  . Prostate cancer Maternal Grandfather        dx in his 20s  . Lung cancer Paternal Grandfather        dx <50  . Lymphoma Maternal Aunt        dx in her 18s  . Melanoma Cousin 15       maternal first cousin  . Brain cancer Cousin        paternal first cousin dx under 7  . Prostate cancer Other        MGF's father  . Colon cancer Other        MGM's mother    Social History Social History  Substance Use Topics  . Smoking status: Former Smoker    Types: Cigarettes    Quit date: 08/20/1994  . Smokeless tobacco: Never Used  . Alcohol use Yes     Comment: Occasionally     Allergies   Patient has no known allergies.   Review of Systems Review of Systems  All other systems reviewed  and are negative.    Physical Exam Updated Vital Signs BP (!) 119/59 (BP Location: Right Arm)   Pulse 98   Temp 97.8 F (36.6 C) (Temporal)   Resp 17   SpO2 98%   Physical Exam  Constitutional: She is oriented to person, place, and time. She appears well-developed and well-nourished.  HENT:  Head: Normocephalic and atraumatic.  Mouth with cracking at the angle of the lips. No associated bleeding or drainage.   Eyes: Conjunctivae and EOM are normal. Pupils are equal, round, and reactive to light.  Neck: Normal range of motion and phonation normal. Neck supple.  Cardiovascular: Normal rate and regular rhythm.   No murmur heard. Pulmonary/Chest: Effort normal and breath sounds normal. She exhibits no tenderness.  Abdominal: Soft. She exhibits no distension. There is no tenderness. There is no guarding.  Musculoskeletal: Normal range of motion.  Neurological: She is alert and oriented to person, place, and time. She exhibits normal muscle tone.  Skin: Skin is warm and dry.  Psychiatric: She has a normal mood and affect. Her behavior is normal. Judgment and thought content normal.  Nursing note and vitals reviewed.    ED Treatments / Results  DIAGNOSTIC STUDIES:  Oxygen Saturation is 98% on RA, normal by my interpretation.    COORDINATION OF CARE:  12:22 PM Discussed treatment plan with pt at bedside including CBC, BMP, Troponin, EKG, CXR, D-Dimer, Tylenol and pt agreed to plan.  12:24 PM Pt was offered pain medication but declined them.  12:26 PM At second offer, she requested something for relief of her HA.   Labs (all labs ordered are listed, but only abnormal results are displayed) Labs Reviewed  CBC WITH DIFFERENTIAL/PLATELET - Abnormal; Notable for the following:       Result Value   Hemoglobin 11.9 (*)    RDW 17.6 (*)    All other components within normal limits  BASIC METABOLIC PANEL - Abnormal; Notable for the following:    Chloride 100 (*)    Glucose, Bld  112 (*)    All other components within normal limits  TROPONIN I  D-DIMER, QUANTITATIVE (NOT AT [facility])    EKG  EKG Interpretation  Date/Time:  Monday Sep 28 2016 11:51:03 EDT Ventricular Rate:  97 PR Interval:  124 QRS Duration: 76 QT Interval:  346 QTC Calculation: 439 R Axis:   -26 Text Interpretation:  Normal sinus rhythm Low voltage QRS Inferior infarct , age undetermined Cannot rule out Anterior infarct , age undetermined Abnormal ECG since last tracing no significant change Confirmed by Daleen Bo [phone]) on 09/28/2016 12:35:22 PM       Radiology Dg Chest 2 View  Result Date: 09/28/2016 CLINICAL DATA:  She woke up this morning and started having rt sided chest pain into mid chest and jaw pain. Pt has stage 4 breast ca. EXAM: CHEST  2 VIEW COMPARISON:  None. FINDINGS: Right-sided Port-A-Cath with the tip projecting over the SVC. The heart size and mediastinal contours are within normal limits. Both lungs are clear. The visualized skeletal structures are unremarkable. IMPRESSION: No active cardiopulmonary disease. Electronically Signed   By: Kathreen Devoid   On: 09/28/2016 13:00    Procedures Procedures (including critical care time)  Medications Ordered in ED Medications  acetaminophen (TYLENOL) tablet 650 mg (650 mg Oral Given 09/28/16 1231)     Initial Impression / Assessment and Plan / ED Course  I have reviewed the triage vital signs and the  nursing notes.  Pertinent labs & imaging results that were available during my care of the patient were reviewed by me and considered in my medical decision making (see chart for details).      Patient Vitals for the past 24 hrs:  BP Temp Temp src Pulse Resp SpO2  09/28/16 1151 (!) 119/59 97.8 F (36.6 C) Temporal 98 17 98 %    2:10 PM Reevaluation with update and discussion. After initial assessment and treatment, an updated evaluation reveals she remains comfortable.  No further complaints.  Findings discussed with  patient and family members, all questions answered. Curstin Schmale L    Final Clinical Impressions(s) / ED Diagnoses   Final diagnoses:  None   Nonspecific chest pain, with multiple different possibilities.  Patient has a metastatic lesion in the area of her right chest where she is tender.  I suspect that this is metastatic bony pain, however may be some reflux disease as well.  Doubt ACS, PE, pneumonia or metabolic instability.  Nursing Notes Reviewed/ Care Coordinated Applicable Imaging Reviewed Interpretation of Laboratory Data incorporated into ED treatment  The patient appears reasonably screened and/or stabilized for discharge and I doubt any other medical condition or other [facility] requiring further screening, evaluation, or treatment in the ED at this time prior to discharge.  Plan: Home Medications-continue usual medications, use antacid before meals and at bedtime for 1 week; Home Treatments-rest, heat as needed; return here if the recommended treatment, does not improve the symptoms; Recommended follow up-pcp, as needed   New Prescriptions New Prescriptions   No medications on file    I personally performed the services described in this documentation, which was scribed in my presence. The recorded information has been reviewed and is accurate.     Daleen Bo, MD 09/28/16 [phone]

## 2016-09-28 NOTE — ED Notes (Signed)
ED Provider at bedside. 

## 2016-09-28 NOTE — ED Triage Notes (Signed)
Patient complaining of chest pain that started while lying down at 1000 this morning. States pain radiates into bilateral jaws. States pain has resolved but has extreme nausea.

## 2016-09-28 NOTE — Telephone Encounter (Signed)
Patient's husband called stating patient was having nausea, chest pain and pressure, and jaw pain. Reviewed with RN, instructed husband to take patient to the ER. He verbalized understanding.

## 2016-09-30 ENCOUNTER — Encounter (HOSPITAL_COMMUNITY): Payer: Self-pay | Admitting: Oncology

## 2016-09-30 ENCOUNTER — Encounter (HOSPITAL_BASED_OUTPATIENT_CLINIC_OR_DEPARTMENT_OTHER): Payer: 59 | Admitting: Oncology

## 2016-09-30 ENCOUNTER — Encounter (HOSPITAL_COMMUNITY): Payer: 59 | Attending: Oncology

## 2016-09-30 ENCOUNTER — Ambulatory Visit (HOSPITAL_COMMUNITY): Payer: 59

## 2016-09-30 VITALS — BP 144/92 | HR 97 | Temp 98.2°F | Resp 18 | Wt 250.0 lb

## 2016-09-30 DIAGNOSIS — C787 Secondary malignant neoplasm of liver and intrahepatic bile duct: Secondary | ICD-10-CM | POA: Diagnosis not present

## 2016-09-30 DIAGNOSIS — Z170 Estrogen receptor positive status [ER+]: Secondary | ICD-10-CM

## 2016-09-30 DIAGNOSIS — C50919 Malignant neoplasm of unspecified site of unspecified female breast: Secondary | ICD-10-CM

## 2016-09-30 DIAGNOSIS — C7951 Secondary malignant neoplasm of bone: Secondary | ICD-10-CM

## 2016-09-30 DIAGNOSIS — C7931 Secondary malignant neoplasm of brain: Secondary | ICD-10-CM

## 2016-09-30 DIAGNOSIS — C50911 Malignant neoplasm of unspecified site of right female breast: Secondary | ICD-10-CM

## 2016-09-30 DIAGNOSIS — C229 Malignant neoplasm of liver, not specified as primary or secondary: Secondary | ICD-10-CM | POA: Insufficient documentation

## 2016-09-30 MED ORDER — SODIUM CHLORIDE 0.9 % IV SOLN
INTRAVENOUS | Status: DC
  Administered 2016-09-30: 14:00:00 via INTRAVENOUS

## 2016-09-30 MED ORDER — HEPARIN SOD (PORK) LOCK FLUSH 100 UNIT/ML IV SOLN
500.0000 [IU] | Freq: Once | INTRAVENOUS | Status: AC
  Administered 2016-09-30: 500 [IU] via INTRAVENOUS

## 2016-09-30 MED ORDER — ZOLEDRONIC ACID 4 MG/100ML IV SOLN
4.0000 mg | Freq: Once | INTRAVENOUS | Status: AC
  Administered 2016-09-30: 4 mg via INTRAVENOUS
  Filled 2016-09-30: qty 100

## 2016-09-30 MED ORDER — XELODA 500 MG PO TABS: ORAL_TABLET | ORAL | 0 refills | Status: DC

## 2016-09-30 MED ORDER — HEPARIN SOD (PORK) LOCK FLUSH 100 UNIT/ML IV SOLN
INTRAVENOUS | Status: AC
  Filled 2016-09-30: qty 5

## 2016-09-30 NOTE — Progress Notes (Signed)
Tolerated infusion w/o adverse reaction.  Alert, in no distress.  Discharged ambulatory.  

## 2016-09-30 NOTE — Progress Notes (Signed)
[name], MD [place] [address]  Metastatic breast cancer [facility]) - Plan: XELODA 500 MG tablet, MR Brain W Wo Contrast, NM PET Image Restag (PS) Skull Base To Thigh, NM Cardiac Muga Rest  Malignant neoplasm of right breast, stage 4 (HCC)  CURRENT THERAPY:Xeloda 1500 mg PO BID 7 days on and 7 days off with Tykerb.  Zometa monthly.  INTERVAL HISTORY: [name] 51 y.o. female returns for followup of Stage IV metastatic breast cancer with liver, bone, brain, and spine mets.  ER+/HER2+.    Breast cancer, stage 4 ([place])   04/25/2014 Initial Diagnosis    Breast cancer, stage 4      04/25/2014 Imaging    CT abdomen/pelvis with widespread metastatic disease to the liver, multiple lytic lesions throughout spine and pelvis. No FX or epidural tumor identified      04/26/2014 Imaging    CT head unremarkable      04/26/2014 Imaging    CT chest with no lung mass or pulmonary nodules, no adenopathy. Lytic bone lesions, right 2nd rib      04/27/2014 Initial Biopsy    U/S guided liver biopsy, lesion in anterior and inferior left hepatic lobe biopsied      04/27/2014 Pathology Results    Metastatic adenocarcinoma, CK7, ER+, patchy positivity with PR. Possible primary includes breast, less likely gynecologic      05/15/2014 Mammogram    BI-RADS CATEGORY  2: Benign Finding(s)      05/16/2014 PET scan    1. Intensely hypermetabolic hepatic metastasis. 2. Widespread hypermetabolic skeletal lesions. 3. No primary adenocarcinoma identified by FDG PET imaging.      05/19/2014 Imaging    MUGA- Left ventricular ejection fracture greater than 70%.      05/21/2014 Breast MRI    No suspicious masses or enhancement within the breasts. No axillary adenopathy.      05/22/2014 - 07/03/2014 Antibody Plan    Herceptin/Perjeta/Tamoxifen      06/12/2014 - 07/03/2014 Chemotherapy    Taxotere added secondary to persistent abdominal and back pain      06/17/2014 - 06/19/2014  Hospital Admission    Neutropenia, fever, diarrhea, nausea, vomiting      06/20/2014 - 07/10/2014 Radiation Therapy    Dr. Thea Silversmith 12 fractions to L3-S3 (30 Gy) and left scapula (20 Gy).       07/03/2014 Adverse Reaction    Perjeta- induced diarrhea.  Perjeta discontinued      07/16/2014 - 07/20/2014 Hospital Admission    Electrolyte abnormalities, and diarrhea.  Suspect Perjeta-induced diarrhea.  Negative GI work-up.      07/24/2014 - 08/19/2015 Chemotherapy    Herceptin/Tamoxifen/Xgeva      08/21/2014 Imaging    MUGA- Left ventricular ejection fraction equals 71%.      08/24/2014 PET scan    Dramatic reduction in metabolic activity of the widespread liver metastasis. Liver metastasis now have metabolic activity equal to background normal liver activity. Liver has a nodular contour. Marked reduction in metabolic activity of skeletal lesions..      10/05/2014 Progression    Widespread metastatic disease to the brain as described. Between 20 and 30 intracranial metastatic deposits are now seen. No midline shift or incipient herniation      10/09/2014 - 10/26/2014 Radiation Therapy    Whole Brain XRT      11/14/2014 Imaging    MUGA- LVEF 67%      02/13/2015 Imaging  MUGA- LVEF 59%      02/15/2015 Treatment Plan Change    Due to declining LVEF, will hold Herceptin per PI guidelines.      04/12/2015 -  Chemotherapy    Herceptin restarted      06/02/2015 - 06/08/2015 Hospital Admission    Pneumonia      07/05/2015 Progression     PET/CT concern for mild progression of skeletal metastasis with several lesions within the spine and 1 lesion in the Left iliac wing with mild to moderate metabolic activity new from prior. Rising CA 27-29      07/16/2015 - 10/23/2015 Anti-estrogen oral therapy    Arimidex      07/16/2015 Imaging    MRI brain with satisfactory post treatment apperance of brain. interval resolved enhancing R caudate metastasis, minimal punctate residual enhancing  metastatic disease at the inferior L cerebellum. No new metastatic disease or new intracranial abnormality      07/19/2015 Treatment Plan Change    Discontinue Tamoxifen, Zoladex plus Arimidex.       08/27/2015 Procedure    Laparoscopic bilateral salpingo-oophorectomy by Dr. Gaetano Net      10/17/2015 PET scan    Osseous metastatic disease appears slightly progressive based on a new right scapular lesion and increased uptake within lesions in the thoracic spine, left iliac wing and  proximal right femur.      10/17/2015 Progression    Slight progression on PET scan imaging.      10/18/2015 Imaging    REsolved enhancing metastatic disease to the brain status post WBXRT      10/23/2015 - 02/12/2016 Adjuvant Chemotherapy    Faslodex loading followed by maintenance dose.  (Herceptin continued)      11/04/2015 Imaging    MUGA- LEFT ventricular ejection fraction 51% slightly decreased in a 57% on the previous exam.      12/24/2015 Treatment Plan Change    Zometa every 28 days.  Xgeva discontinued.        01/01/2016 Imaging    MUGA- Left ventricular ejection fraction equals 57.9%. This is increased from 51.1% previously.      02/03/2016 PET scan    1. Mixed metabolic changes in the scattered hypermetabolic sclerotic osseous metastases throughout the axial and proximal appendicular skeleton as detailed above. 2. No new sites of hypermetabolic metastatic disease. Stable pseudo-cirrhotic appearance of the liver due to treated liver metastases with no hypermetabolic liver metastases.       02/03/2016 Progression    PET shows mixed osseous response.  Some lesions more hypermetabolic, others improved.      02/05/2016 Treatment Plan Change    D/C Herceptin.  Faslodex as scheduled on 02/12/2016, then discontinued.  Continue Zometa.      02/05/2016 Treatment Plan Change    Prescriptions for Xeloda 7 days on and 7 days off and Tykerb printed and provided for authorization.      02/19/2016 -   Chemotherapy    Xeloda 2300 mg BID 7 days on and 7 days off and Tykerb       02/24/2016 Treatment Plan Change    Xeloda dose reduced by 10% to 2000 mg BID week on and week off.      03/18/2016 Treatment Plan Change    Xeloda dose reduced to 2000 mg in AM and 1500 mg in PM 7 days on and 7 days off.      03/23/2016 Imaging    MUGA- Normal LEFT ventricular ejection fraction of 56% not significantly changed from  58% on previous exam.      05/19/2016 Imaging    MRI brain- . No new focus of abnormal enhancement to suggest interval metastatic disease. 2. No acute intracranial abnormality. 3. Stable foci of T2 FLAIR hyperintensity in white matter and in the right caudate head compatible with posttreatment changes and treated metastasis.      05/20/2016 PET scan    1. The multiple osseous metastatic lesions shown to be hypermetabolic on the prior exam are reduced in activity or even resolved in hypermetabolic activity compared to prior, as detailed above. There are also numerous sclerotic bony lesions wedge are not currently and were not previously hypermetabolic, compatible with old non active lesions. 2. No findings of extra osseous metastatic disease currently. 3. Hepatic pseudocirrhosis. 4. Healing rib fractures. Chronic pathologic fracture the left acromion.      07/08/2016 Imaging    MUGA- Low normal LEFT ventricular ejection fraction of 53%, little changed since the 56% on 03/23/2016 but slightly decreased from the 58% on 01/01/2016.      09/07/2016 Echocardiogram    MUGA- Normal LEFT ventricular ejection fraction of 59% with normal LV wall motion.      09/07/2016 Treatment Plan Change    Xeloda dose reduced to 1500 mg BID 7 days on and 7 days off.      09/15/2016 PET scan    No significant change in diffuse sclerotic bone metastases on CT images, although several show mildly increased FDG uptake since prior study.  No evidence of soft tissue metastatic disease.         HPI Elements   Location: Breast cancer  Quality: Adenocarcinoma, ER/HER2 POSITIVE  Severity: Stage IV  Duration: Biopsy proven on 04/27/2014  Context: Undiscovered primary tumor, S/P B/L breast reduction.  Timing:   Modifying Factors: Brain mets  Associated Signs & Symptoms: Fatigue, tiredness    She reports fatigue and tiredness.  She otherwise denies any oncology complaints.  Her fatigue and tiredness is no doubt multifactorial with an element of depression and ongoing chemotherapy (heavily pretreated).  I reviewed her imaging studies and although there is mild increase in hypermetabolic activity of sclerotic osseous lesions, no new areas of disease identified.  She reported to the emergency room recently with chest pain.  Workup was complete and no cardiac issue was identified.  EKG is reviewed.Chest x-rays reviewed.  Laboratory work is reviewed.  Complete cardiac workup is negative.  She denies any recurrence of this pain.  She describes the pain as right-sided jaw pain with radiation to her right shoulder, right mammary/inframammary area, and up into sternum.  Review of Systems  Constitutional: Positive for malaise/fatigue. Negative for chills, fever and weight loss.  HENT: Negative.   Eyes: Negative.   Respiratory: Negative.  Negative for cough.   Cardiovascular: Negative.  Negative for chest pain.  Gastrointestinal: Negative.  Negative for blood in stool, constipation, diarrhea, melena, nausea and vomiting.  Genitourinary: Negative.   Musculoskeletal: Negative.   Skin: Negative.   Neurological: Negative.  Negative for weakness.  Endo/Heme/Allergies: Negative.   Psychiatric/Behavioral: Negative.     Past Medical History:  Diagnosis Date  . Anticoagulated    xarelto  . Anxiety   . Breast cancer metastasized to multiple sites [facility])    liver, brain, and bone  . Breast cancer, stage 4 [facility]) oncologist-  dr Larene [place] ([facility])   dx 12/ 2015 -- breast  cancer Stage 4,  ER/HER2 +,  w/  liver, brain and  bone  mets/  chemotherapy and radiation therapy  . Chronic pain syndrome    secondary to cancer   . Depression   . Drug-induced cardiomyopathy ([place])    per last MUGA (08-08-2015), ef 56.5/ per last echo 05-18-2014 ef 60-65%  . Family history of prostate cancer   . GERD (gastroesophageal reflux disease)   . History of colon polyps    07-13-2013  benign  . History of DVT (deep vein thrombosis)    07-09-2014  upper right extremity-  RIJ and right subclavian--  resolved  . History of gastritis    erosive  . History of pneumonia    HCAP 06-07-2015--  resolved per cxr 07-04-2015  . History of radiation therapy    12 fractions to L3 - S3, 30Gy and left spacula 20Gy (06-20-2014 to 07-10-2014) //  whole brain rxt (10-09-2014 to 10-26-2014)  . History of small bowel obstruction    S/P RESECTION 2008  . Migraine   . PONV (postoperative nausea and vomiting)    pt states scope patch does well    Past Surgical History:  Procedure Laterality Date  . BREAST REDUCTION SURGERY  03/17/2011   Procedure: MAMMARY REDUCTION BILATERAL (BREAST);  Surgeon: Mary A Contogiannis;  Location: [facility];  Service: Plastics;  Laterality: Bilateral;  . CATARACT EXTRACTION W/ INTRAOCULAR LENS  IMPLANT, BILATERAL  2008  . CERVICAL FUSION  2003   C5 -- C6  . COLONOSCOPY N/A 07/13/2013   Procedure: COLONOSCOPY;  Surgeon: Rogene Houston, MD;  [facility];  Service: Endoscopy;  Laterality: N/A;  930  . COLONOSCOPY N/A 11/26/2014   Procedure: COLONOSCOPY;  Surgeon: Rogene Houston, MD;  [facility];  Service: Endoscopy;  Laterality: N/A;  730  . D & C HYSTEROSCOPY/  RESECTION ENDOMETRIAL MASS/  [place] ENDOMETRIAL ABLATION  04-11-2010  . DX LAPAROSCOPY W/ PARTIAL SMALL BOWEL RESECTION AND APPENDECTOMY  04-13-2007  . ESOPHAGOGASTRODUODENOSCOPY N/A 05/25/2014   Procedure: ESOPHAGOGASTRODUODENOSCOPY (EGD);  Surgeon: Rogene Houston,  MD;  [facility];  Service: Endoscopy;  Laterality: N/A;  155  . KNEE ARTHROSCOPY Right 2005  . LAPAROSCOPIC ASSISTED VAGINAL HYSTERECTOMY  10-13-2010   w/ Bx Left Fallopian tube and Aspiration Right Ovarian Cyst  . LAPAROSCOPIC CHOLECYSTECTOMY  11-17-2002  . LAPAROSCOPIC SALPINGO OOPHERECTOMY Bilateral 08/26/2015   Procedure: LAPAROSCOPIC SALPINGO OOPHORECTOMY, bilateral;  Surgeon: Everlene Farrier, MD;  Location: [facility];  Service: Gynecology;  Laterality: Bilateral;  . PORTACATH PLACEMENT  05-17-2014  . REDUCTION MAMMAPLASTY    . SHOULDER ARTHROSCOPY WITH ROTATOR CUFF REPAIR Right 2002  . TRANSTHORACIC ECHOCARDIOGRAM  05-18-2014   ef 60-65%//   last MUGA  (08-08-2015)  ef 56.6%      Family History  Problem Relation Age of Onset  . Diabetes Father   . Heart attack Maternal Grandmother 30       multiple over lifetime.  . Cancer Maternal Grandmother 33       NOS  . Prostate cancer Maternal Grandfather        dx in his 41s  . Lung cancer Paternal Grandfather        dx <50  . Lymphoma Maternal Aunt        dx in her 6s  . Melanoma Cousin 67       maternal first cousin  . Brain cancer Cousin        paternal first cousin dx under 67  . Prostate cancer Other  MGF's father  . Colon cancer Other        MGM's mother    Social History   Social History  . Marital status: Married    Spouse name: N/A  . Number of children: N/A  . Years of education: N/A   Social History Main Topics  . Smoking status: Former Smoker    Types: Cigarettes    Quit date: 08/20/1994  . Smokeless tobacco: Never Used  . Alcohol use Yes     Comment: Occasionally  . Drug use: No  . Sexual activity: Not Asked   Other Topics Concern  . None   Social History Narrative  . None     PHYSICAL EXAMINATION  ECOG PERFORMANCE STATUS: 1 - Symptomatic but completely ambulatory  There were no vitals filed for this visit.  Vitals - 1 value per visit 6/33/3545  SYSTOLIC  625  DIASTOLIC 92  Pulse 97  Temperature 98.2  Respirations 18  Weight (lb) 250   GENERAL:alert, no distress, well nourished, well developed, comfortable, cooperative, obese, smiling and accompanied by her husband, Thayer Jew. SKIN: skin color, texture, turgor are normal, no rashes or significant lesions HEAD: Normocephalic, No masses, lesions, tenderness or abnormalities EYES: normal, EOMI, Conjunctiva are pink and non-injected EARS: External ears normal OROPHARYNX:lips, buccal mucosa, and tongue normal and mucous membranes are moist  NECK: supple, trachea midline LYMPH:  no palpable lymphadenopathy BREAST:not examined LUNGS: clear to auscultation  HEART: regular rate & rhythm ABDOMEN:abdomen soft, obese and normal bowel sounds BACK: Back symmetric, no curvature. EXTREMITIES:less then 2 second capillary refill, no joint deformities, effusion, or inflammation, no edema, no skin discoloration, no cyanosis  NEURO: alert & oriented x 3 with fluent speech, no focal motor/sensory deficits, gait normal   LABORATORY DATA: CBC    Component Value Date/Time   WBC 5.0 09/28/2016 1236   RBC 4.01 09/28/2016 1236   HGB 11.9 (L) 09/28/2016 1236   HCT 36.6 09/28/2016 1236   PLT 211 09/28/2016 1236   MCV 91.3 09/28/2016 1236   MCH 29.7 09/28/2016 1236   MCHC 32.5 09/28/2016 1236   RDW 17.6 (H) 09/28/2016 1236   LYMPHSABS 1.1 09/28/2016 1236   MONOABS 0.3 09/28/2016 1236   EOSABS 0.0 09/28/2016 1236   BASOSABS 0.0 09/28/2016 1236      Chemistry      Component Value Date/Time   NA 140 09/28/2016 1236   K 3.9 09/28/2016 1236   CL 100 (L) 09/28/2016 1236   CO2 32 09/28/2016 1236   BUN 12 09/28/2016 1236   CREATININE 0.75 09/28/2016 1236      Component Value Date/Time   CALCIUM 9.2 09/28/2016 1236   ALKPHOS 91 09/02/2016 1130   AST 22 09/02/2016 1130   ALT 17 09/02/2016 1130   BILITOT 0.4 09/02/2016 1130        PENDING LABS:   RADIOGRAPHIC STUDIES:  Dg Chest 2  View  Result Date: 09/28/2016 CLINICAL DATA:  She woke up this morning and started having rt sided chest pain into mid chest and jaw pain. Pt has stage 4 breast ca. EXAM: CHEST  2 VIEW COMPARISON:  None. FINDINGS: Right-sided Port-A-Cath with the tip projecting over the SVC. The heart size and mediastinal contours are within normal limits. Both lungs are clear. The visualized skeletal structures are unremarkable. IMPRESSION: No active cardiopulmonary disease. Electronically Signed   By: Kathreen Devoid   On: 09/28/2016 13:00   Nm Cardiac Muga Rest  Result Date: 09/07/2016 CLINICAL DATA:  Stage IV  breast cancer, chemo therapeutic drug monitoring EXAM: NUCLEAR MEDICINE CARDIAC BLOOD POOL IMAGING (MUGA) TECHNIQUE: Cardiac multi-gated acquisition was performed at rest following intravenous injection of Tc-35mlabeled red blood cells. RADIOPHARMACEUTICALS:  21 mCi Tc-918mertechnetate in-vitro labeled autologous red blood cells IV COMPARISON:  07/08/2016 FINDINGS: LEFT ventricular ejection fraction is calculated at 59%, increased from 53% on previous exam. Normal LEFT ventricular wall motion. IMPRESSION: Normal LEFT ventricular ejection fraction of 59% with normal LV wall motion. Electronically Signed   By: MaLavonia Dana.D.   On: 09/07/2016 15:30   Nm Pet Image Restag (ps) Skull Base To Thigh  Result Date: 09/15/2016 CLINICAL DATA:  Subsequent treatment strategy for stage IV breast carcinoma. EXAM: NUCLEAR MEDICINE PET SKULL BASE TO THIGH TECHNIQUE: 12.4 mCi F-18 FDG was injected intravenously. Full-ring PET imaging was performed from the skull base to thigh after the radiotracer. CT data was obtained and used for attenuation correction and anatomic localization. FASTING BLOOD GLUCOSE:  Value: 113 mg/dl COMPARISON:  05/20/2016 FINDINGS: NECK No hypermetabolic lymph nodes in the neck. CHEST No hypermetabolic axillary, mediastinal or hilar nodes. No suspicious pulmonary nodules on the CT scan. ABDOMEN/PELVIS No  abnormal hypermetabolic activity within the liver, pancreas, adrenal glands, or spleen. Cirrhotic morphology of liver is seen most likely due to pseudocirrhosis. Ill-defined low-attenuation lesion in the posterior right hepatic lobe shows no FDG uptake. No hypermetabolic lymph nodes in the abdomen or pelvis. SKELETON Diffuse sclerotic bone metastases show no significant change in size or number on CT images compared to prior exam. Healing fractures of the left acromion and several bilateral ribs appear stable. Several metastases show mild increase in FDG uptake since previous study. Examples include right lateral fifth rib with current SUV max of 5.5 compared to 3.8 previously, left ilium with current SUV max of 12.8 compared to 9.0 previously, and the right hip with current SUV max 11.3 compared to 5.7 previously. No new sites of bone metastasis identified. IMPRESSION: No significant change in diffuse sclerotic bone metastases on CT images, although several show mildly increased FDG uptake since prior study. No evidence of soft tissue metastatic disease. Electronically Signed   By: JoEarle Gell.D.   On: 09/15/2016 13:57     PATHOLOGY:    ASSESSMENT AND PLAN:  Breast cancer, stage 4 (HCC) Stage IV adenocarcinoma of the breast, ER+, HER 2 + disease with brain and bone metastases.  S/P BSO by Dr. ToGaetano Netn 08/27/2015.  Oncology history is updated.  Labs performed on 09/28/2016 in the ED: CBC diff, BMET, Troponin, D-Dimer.  I personally reviewed and went over laboratory results with the patient.  The results are noted within this dictation.  Labs satisfy treatment parameters.  Nursing, in accordance with bisphosphonate administration protocol, will monitor for acute side effects/toxicities associated with administration today.  Labs in 4 weeks: CBC diff, CMET, CA 27.29, CA 15-3.  I personally reviewed and went over radiographic studies with the patient.  The results are noted within this dictation.   I personally reviewed the images in PACS.  PET scan on 09/15/2016 is relatively stable without any new evidence of disease but did identify a few sclerotic bone osseous lesions with mildly increased hypermetabolic activity.  At this time, given limited treatment options moving forward, we will continue with current therapy until frank progression of disease is identified.  Order is placed for repeat PET imaging in July 2018. Also, I have ordered MRI brain imaging in July 2018 for COMPLETE restaging purposes.  Current therapy  is Xeloda (dose-reduced)/Tykerb/Zometa.  MUGA was completed on 09/07/2016. Next MUGA is due in July 2018.  Return in 4 weeks for follow-up with Zometa infusion to reduce SRE from osseous involvement of dz.  Continue with treatment as outlined above.   ORDERS PLACED FOR THIS ENCOUNTER: Orders Placed This Encounter  Procedures  . MR Brain W Wo Contrast  . NM PET Image Restag (PS) Skull Base To Thigh  . NM Cardiac Muga Rest    MEDICATIONS PRESCRIBED THIS ENCOUNTER: Meds ordered this encounter  Medications  . XELODA 500 MG tablet    Sig: Take 1500 mg PO BID, 7 days on and 7 days off    Dispense:  84 tablet    Refill:  0    Order Specific Question:   Supervising Provider    Answer:   Brunetta Genera [[id number]]    THERAPY PLAN:  Continue with Xeloda/Tykerb/Zometa.  All questions were answered. The patient knows to call the clinic with any problems, questions or concerns. We can certainly see the patient much sooner if necessary.  Patient and plan discussed with Dr. Twana First and she is in agreement with the aforementioned.   This note is electronically signed by: Doy Mince 09/30/2016 5:18 PM

## 2016-09-30 NOTE — Patient Instructions (Signed)
[place] at [facility] Discharge Instructions  RECOMMENDATIONS MADE BY THE CONSULTANT AND ANY TEST RESULTS WILL BE SENT TO YOUR REFERRING PHYSICIAN.  Continue treatment- Zometa every 4 weeks, Xeloda 7 days on and 7 days off, and Tykerb daily. We will see you back with labs in 4 weeks for your next Zometa infusion. In July, we will perform repeat imaging with PET scan and MRI brain. Also in July, you are due for another MUGA. You are provided paperwork for a handicap placard.  Thank you for choosing [place] at [facility] to provide your oncology and hematology care.  To afford each patient quality time with our provider, please arrive at least 15 minutes before your scheduled appointment time.    If you have a lab appointment with the [facility] please come in thru the  Main Entrance and check in at the main information desk  You need to re-schedule your appointment should you arrive 10 or more minutes late.  We strive to give you quality time with our providers, and arriving late affects you and other patients whose appointments are after yours.  Also, if you no show three or more times for appointments you may be dismissed from the clinic at the providers discretion.     Again, thank you for choosing [facility].  Our hope is that these requests will decrease the amount of time that you wait before being seen by our physicians.       _____________________________________________________________  Should you have questions after your visit to [facility], please contact our office at [phone] between the hours of 8:30 a.m. and 4:30 p.m.  Voicemails left after 4:30 p.m. will not be returned until the following business day.  For prescription refill requests, have your pharmacy contact our office.       Resources For Cancer Patients and their Caregivers ? American Cancer Society: Can assist with  transportation, wigs, general needs, runs Look Good Feel Better.        862 821 8236 ? Cancer Care: Provides financial assistance, online support groups, medication/co-pay assistance.  1-800-813-HOPE (401)163-5297) ? [facility] Assists [place] cancer patients and their families through emotional , educational and financial support.  867-219-2255 ? [place] DSS Where to apply for food stamps, Medicaid and utility assistance. (857)026-9446 ? [facility]: Transportation to medical appointments. (531) 438-0084 ? Social Security Administration: May apply for disability if have a Stage IV cancer. (918)284-4640 518 253 5845 ? [employer], Disability and Transit Services: Assists with nutrition, care and transit needs. [facility] Support Programs: @10RELATIVEDAYS @ > Cancer Support Group  2nd Tuesday of the month 1pm-2pm, Journey Room  > Creative Journey  3rd Tuesday of the month 1130am-1pm, Journey Room  > Look Good Feel Better  1st Wednesday of the month 10am-12 noon, Journey Room (Call [facility] to register [phone])

## 2016-09-30 NOTE — Assessment & Plan Note (Addendum)
Stage IV adenocarcinoma of the breast, ER+, HER 2 + disease with brain and bone metastases.  S/P BSO by Dr. Gaetano Net on 08/27/2015.  Oncology history is updated.  Labs performed on 09/28/2016 in the ED: CBC diff, BMET, Troponin, D-Dimer.  I personally reviewed and went over laboratory results with the patient.  The results are noted within this dictation.  Labs satisfy treatment parameters.  Nursing, in accordance with bisphosphonate administration protocol, will monitor for acute side effects/toxicities associated with administration today.  Labs in 4 weeks: CBC diff, CMET, CA 27.29, CA 15-3.  I personally reviewed and went over radiographic studies with the patient.  The results are noted within this dictation.  I personally reviewed the images in PACS.  PET scan on 09/15/2016 is relatively stable without any new evidence of disease but did identify a few sclerotic bone osseous lesions with mildly increased hypermetabolic activity.  At this time, given limited treatment options moving forward, we will continue with current therapy until frank progression of disease is identified.  Order is placed for repeat PET imaging in July 2018. Also, I have ordered MRI brain imaging in July 2018 for COMPLETE restaging purposes.  Current therapy is Xeloda (dose-reduced)/Tykerb/Zometa.  MUGA was completed on 09/07/2016. Next MUGA is due in July 2018.  Return in 4 weeks for follow-up with Zometa infusion to reduce SRE from osseous involvement of dz.  Continue with treatment as outlined above.

## 2016-09-30 NOTE — Patient Instructions (Signed)
[place] at [facility] Discharge Instructions  RECOMMENDATIONS MADE BY THE CONSULTANT AND ANY TEST RESULTS WILL BE SENT TO YOUR REFERRING PHYSICIAN.  Zometa infusion today. Return as scheduled.   Thank you for choosing [place] at [facility] to provide your oncology and hematology care.  To afford each patient quality time with our provider, please arrive at least 15 minutes before your scheduled appointment time.    If you have a lab appointment with the [facility] please come in thru the  Main Entrance and check in at the main information desk  You need to re-schedule your appointment should you arrive 10 or more minutes late.  We strive to give you quality time with our providers, and arriving late affects you and other patients whose appointments are after yours.  Also, if you no show three or more times for appointments you may be dismissed from the clinic at the providers discretion.     Again, thank you for choosing [facility].  Our hope is that these requests will decrease the amount of time that you wait before being seen by our physicians.       _____________________________________________________________  Should you have questions after your visit to [facility], please contact our office at [phone] between the hours of 8:30 a.m. and 4:30 p.m.  Voicemails left after 4:30 p.m. will not be returned until the following business day.  For prescription refill requests, have your pharmacy contact our office.       Resources For Cancer Patients and their Caregivers ? American Cancer Society: Can assist with transportation, wigs, general needs, runs Look Good Feel Better.        330 738 7342 ? Cancer Care: Provides financial assistance, online support groups, medication/co-pay assistance.  1-800-813-HOPE 321-884-4844) ? [facility] Assists [place] cancer patients and  their families through emotional , educational and financial support.  (972)102-2692 ? [place] DSS Where to apply for food stamps, Medicaid and utility assistance. (925) 201-1203 ? [facility]: Transportation to medical appointments. (949) 270-1343 ? Social Security Administration: May apply for disability if have a Stage IV cancer. (715)238-3003 772-548-8108 ? [employer], Disability and Transit Services: Assists with nutrition, care and transit needs. [place] Support Programs: @10RELATIVEDAYS @ > Cancer Support Group  2nd Tuesday of the month 1pm-2pm, Journey Room  > Creative Journey  3rd Tuesday of the month 1130am-1pm, Journey Room  > Look Good Feel Better  1st Wednesday of the month 10am-12 noon, Journey Room (Call [facility] to register [phone])

## 2016-10-08 ENCOUNTER — Ambulatory Visit (INDEPENDENT_AMBULATORY_CARE_PROVIDER_SITE_OTHER): Payer: 59 | Admitting: Podiatry

## 2016-10-08 ENCOUNTER — Encounter: Payer: Self-pay | Admitting: Podiatry

## 2016-10-08 DIAGNOSIS — M779 Enthesopathy, unspecified: Secondary | ICD-10-CM | POA: Diagnosis not present

## 2016-10-08 MED ORDER — TRIAMCINOLONE ACETONIDE 10 MG/ML IJ SUSP
10.0000 mg | Freq: Once | INTRAMUSCULAR | Status: AC
  Administered 2016-10-08: 10 mg

## 2016-10-08 NOTE — Progress Notes (Signed)
Subjective:    Patient ID: [name], female   DOB: 51 y.o.   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 Reappoint for [place] to recheck

## 2016-10-13 ENCOUNTER — Other Ambulatory Visit (HOSPITAL_COMMUNITY): Payer: Self-pay | Admitting: Oncology

## 2016-10-13 DIAGNOSIS — G4700 Insomnia, unspecified: Secondary | ICD-10-CM

## 2016-10-13 DIAGNOSIS — C229 Malignant neoplasm of liver, not specified as primary or secondary: Secondary | ICD-10-CM

## 2016-10-13 DIAGNOSIS — C50919 Malignant neoplasm of unspecified site of unspecified female breast: Secondary | ICD-10-CM

## 2016-10-13 DIAGNOSIS — F32A Depression, unspecified: Secondary | ICD-10-CM

## 2016-10-13 DIAGNOSIS — R5382 Chronic fatigue, unspecified: Secondary | ICD-10-CM

## 2016-10-13 DIAGNOSIS — F329 Major depressive disorder, single episode, unspecified: Secondary | ICD-10-CM

## 2016-10-13 DIAGNOSIS — K219 Gastro-esophageal reflux disease without esophagitis: Secondary | ICD-10-CM

## 2016-10-15 ENCOUNTER — Encounter (HOSPITAL_COMMUNITY): Payer: Self-pay | Admitting: Adult Health

## 2016-10-15 DIAGNOSIS — C50919 Malignant neoplasm of unspecified site of unspecified female breast: Secondary | ICD-10-CM

## 2016-10-15 DIAGNOSIS — R5382 Chronic fatigue, unspecified: Secondary | ICD-10-CM

## 2016-10-16 MED ORDER — METHYLPHENIDATE HCL 20 MG PO TABS: 20.0000 mg | ORAL_TABLET | Freq: Two times a day (BID) | ORAL | 0 refills | Status: DC

## 2016-10-16 NOTE — Telephone Encounter (Signed)
Received refill request for Ritalin from patient via [facility]. Reviewed with provider, chart checked and refilled.

## 2016-10-20 ENCOUNTER — Telehealth (HOSPITAL_COMMUNITY): Payer: Self-pay | Admitting: Oncology

## 2016-10-20 NOTE — Telephone Encounter (Signed)
Per Michelle/briova rx Xeloda rx is scheduled to be delivered  on 6/15.

## 2016-10-28 ENCOUNTER — Ambulatory Visit: Payer: 59 | Admitting: Orthotics

## 2016-10-29 ENCOUNTER — Encounter (HOSPITAL_COMMUNITY): Payer: Self-pay | Admitting: Adult Health

## 2016-10-29 ENCOUNTER — Encounter (HOSPITAL_COMMUNITY): Payer: Self-pay

## 2016-10-29 ENCOUNTER — Encounter (HOSPITAL_COMMUNITY): Payer: 59 | Attending: Oncology

## 2016-10-29 ENCOUNTER — Encounter (HOSPITAL_BASED_OUTPATIENT_CLINIC_OR_DEPARTMENT_OTHER): Payer: 59 | Admitting: Adult Health

## 2016-10-29 VITALS — BP 110/56 | HR 71 | Temp 99.0°F | Resp 20 | Wt 249.9 lb

## 2016-10-29 DIAGNOSIS — C50919 Malignant neoplasm of unspecified site of unspecified female breast: Secondary | ICD-10-CM

## 2016-10-29 DIAGNOSIS — R51 Headache: Secondary | ICD-10-CM

## 2016-10-29 DIAGNOSIS — Z0000 Encounter for general adult medical examination without abnormal findings: Secondary | ICD-10-CM

## 2016-10-29 DIAGNOSIS — G893 Neoplasm related pain (acute) (chronic): Secondary | ICD-10-CM | POA: Diagnosis not present

## 2016-10-29 DIAGNOSIS — C7931 Secondary malignant neoplasm of brain: Secondary | ICD-10-CM | POA: Diagnosis not present

## 2016-10-29 DIAGNOSIS — C7951 Secondary malignant neoplasm of bone: Secondary | ICD-10-CM

## 2016-10-29 DIAGNOSIS — F418 Other specified anxiety disorders: Secondary | ICD-10-CM

## 2016-10-29 DIAGNOSIS — R5382 Chronic fatigue, unspecified: Secondary | ICD-10-CM

## 2016-10-29 DIAGNOSIS — C787 Secondary malignant neoplasm of liver and intrahepatic bile duct: Secondary | ICD-10-CM

## 2016-10-29 DIAGNOSIS — C229 Malignant neoplasm of liver, not specified as primary or secondary: Secondary | ICD-10-CM | POA: Insufficient documentation

## 2016-10-29 DIAGNOSIS — G620 Drug-induced polyneuropathy: Secondary | ICD-10-CM

## 2016-10-29 DIAGNOSIS — R530 Neoplastic (malignant) related fatigue: Secondary | ICD-10-CM

## 2016-10-29 LAB — COMPREHENSIVE METABOLIC PANEL
ALK PHOS: 93 U/L (ref 38–126)
ALT: 17 U/L (ref 14–54)
AST: 20 U/L (ref 15–41)
Albumin: 3.9 g/dL (ref 3.5–5.0)
Anion gap: 10 (ref 5–15)
BUN: 13 mg/dL (ref 6–20)
CALCIUM: 9.3 mg/dL (ref 8.9–10.3)
CO2: 30 mmol/L (ref 22–32)
CREATININE: 0.77 mg/dL (ref 0.44–1.00)
Chloride: 97 mmol/L — ABNORMAL LOW (ref 101–111)
Glucose, Bld: 122 mg/dL — ABNORMAL HIGH (ref 65–99)
Potassium: 3.4 mmol/L — ABNORMAL LOW (ref 3.5–5.1)
SODIUM: 137 mmol/L (ref 135–145)
Total Bilirubin: 0.3 mg/dL (ref 0.3–1.2)
Total Protein: 7.2 g/dL (ref 6.5–8.1)

## 2016-10-29 LAB — CBC WITH DIFFERENTIAL/PLATELET
BASOS ABS: 0 10*3/uL (ref 0.0–0.1)
Basophils Relative: 0 %
Eosinophils Absolute: 0 10*3/uL (ref 0.0–0.7)
Eosinophils Relative: 0 %
HCT: 38.1 % (ref 36.0–46.0)
HEMOGLOBIN: 12.2 g/dL (ref 12.0–15.0)
LYMPHS ABS: 1 10*3/uL (ref 0.7–4.0)
LYMPHS PCT: 15 %
MCH: 29.2 pg (ref 26.0–34.0)
MCHC: 32 g/dL (ref 30.0–36.0)
MCV: 91.1 fL (ref 78.0–100.0)
Monocytes Absolute: 0.5 10*3/uL (ref 0.1–1.0)
Monocytes Relative: 8 %
NEUTROS PCT: 77 %
Neutro Abs: 5.3 10*3/uL (ref 1.7–7.7)
Platelets: 248 10*3/uL (ref 150–400)
RBC: 4.18 MIL/uL (ref 3.87–5.11)
RDW: 17.9 % — ABNORMAL HIGH (ref 11.5–15.5)
WBC: 6.9 10*3/uL (ref 4.0–10.5)

## 2016-10-29 MED ORDER — HEPARIN SOD (PORK) LOCK FLUSH 100 UNIT/ML IV SOLN
500.0000 [IU] | Freq: Once | INTRAVENOUS | Status: AC
  Administered 2016-10-29: 500 [IU] via INTRAVENOUS

## 2016-10-29 MED ORDER — SODIUM CHLORIDE 0.9 % IV SOLN
INTRAVENOUS | Status: DC
  Administered 2016-10-29: 13:00:00 via INTRAVENOUS

## 2016-10-29 MED ORDER — MORPHINE SULFATE ER 30 MG PO TBCR: 30.0000 mg | EXTENDED_RELEASE_TABLET | Freq: Two times a day (BID) | ORAL | 0 refills | Status: DC

## 2016-10-29 MED ORDER — GABAPENTIN 300 MG PO CAPS: ORAL_CAPSULE | ORAL | 3 refills | Status: DC

## 2016-10-29 MED ORDER — METHYLPHENIDATE HCL 20 MG PO TABS: 20.0000 mg | ORAL_TABLET | Freq: Two times a day (BID) | ORAL | 0 refills | Status: DC

## 2016-10-29 MED ORDER — ZOLEDRONIC ACID 4 MG/100ML IV SOLN
4.0000 mg | Freq: Once | INTRAVENOUS | Status: AC
Start: 2016-10-29 — End: 2016-10-29
  Administered 2016-10-29: 4 mg via INTRAVENOUS
  Filled 2016-10-29: qty 100

## 2016-10-29 MED ORDER — XELODA 500 MG PO TABS: ORAL_TABLET | ORAL | 0 refills | Status: DC

## 2016-10-29 NOTE — Patient Instructions (Signed)
[facility] at [facility] Discharge Instructions  RECOMMENDATIONS MADE BY THE CONSULTANT AND ANY TEST RESULTS WILL BE SENT TO YOUR REFERRING PHYSICIAN.  You were seen today by Gretchen Dawson NP.   Thank you for choosing [facility] at [facility] to provide your oncology and hematology care.  To afford each patient quality time with our provider, please arrive at least 15 minutes before your scheduled appointment time.    If you have a lab appointment with the Cancer Center please come in thru the  Main Entrance and check in at the main information desk  You need to re-schedule your appointment should you arrive 10 or more minutes late.  We strive to give you quality time with our providers, and arriving late affects you and other patients whose appointments are after yours.  Also, if you no show three or more times for appointments you may be dismissed from the clinic at the providers discretion.     Again, thank you for choosing [facility].  Our hope is that these requests will decrease the amount of time that you wait before being seen by our physicians.       _____________________________________________________________  Should you have questions after your visit to [facility], please contact our office at [phone] between the hours of 8:30 a.m. and 4:30 p.m.  Voicemails left after 4:30 p.m. will not be returned until the following business day.  For prescription refill requests, have your pharmacy contact our office.       Resources For Cancer Patients and their Caregivers ? American Cancer Society: Can assist with transportation, wigs, general needs, runs Look Good Feel Better.        1-888-227-6333 ? Cancer Care: Provides financial assistance, online support groups, medication/co-pay assistance.  1-800-813-HOPE (4673) ? [facility] Assists [place] cancer patients and  their families through emotional , educational and financial support.  336-427-4357 ? [place] DSS Where to apply for food stamps, Medicaid and utility assistance. 336-342-1394 ? [facility]: Transportation to medical appointments. 336-347-2287 ? Social Security Administration: May apply for disability if have a Stage IV cancer. 336-342-7796 1-800-772-1213 ? [place] [employer], Disability and Transit Services: Assists with nutrition, care and transit needs. 336-349-2343  Cancer Center Support Programs: @10RELATIVEDAYS@ > Cancer Support Group  2nd Tuesday of the month 1pm-2pm, Journey Room  > Creative Journey  3rd Tuesday of the month 1130am-1pm, Journey Room  > Look Good Feel Better  1st Wednesday of the month 10am-12 noon, Journey Room (Call [employer] to register 1-800-395-5775)    

## 2016-10-29 NOTE — Progress Notes (Addendum)
[name],  [id number]   CLINIC:  Medical Oncology/Hematology  PCP:  [name], MD [place] [address] [phone]   REASON FOR VISIT:  Follow-up for Stage IV metastatic breast adenocarcinoma with liver, bone, brain, and spine mets; ER+/HER2+   CURRENT THERAPY: Xeloda 1500 mg AM and 1500 mg PM, 7 days on/7 days off schedule with Tykerb daily & Zometa monthly   BRIEF ONCOLOGIC HISTORY:    Breast cancer, stage 4 ([place])   04/25/2014 Initial Diagnosis    Breast cancer, stage 4      04/25/2014 Imaging    CT abdomen/pelvis with widespread metastatic disease to the liver, multiple lytic lesions throughout spine and pelvis. No FX or epidural tumor identified      04/26/2014 Imaging    CT head unremarkable      04/26/2014 Imaging    CT chest with no lung mass or pulmonary nodules, no adenopathy. Lytic bone lesions, right 2nd rib      04/27/2014 Initial Biopsy    U/S guided liver biopsy, lesion in anterior and inferior left hepatic lobe biopsied      04/27/2014 Pathology Results    Metastatic adenocarcinoma, CK7, ER+, patchy positivity with PR. Possible primary includes breast, less likely gynecologic      05/15/2014 Mammogram    BI-RADS CATEGORY  2: Benign Finding(s)      05/16/2014 PET scan    1. Intensely hypermetabolic hepatic metastasis. 2. Widespread hypermetabolic skeletal lesions. 3. No primary adenocarcinoma identified by FDG PET imaging.      05/19/2014 Imaging    MUGA- Left ventricular ejection fracture greater than 70%.      05/21/2014 Breast MRI    No suspicious masses or enhancement within the breasts. No axillary adenopathy.      05/22/2014 - 07/03/2014 Antibody Plan    Herceptin/Perjeta/Tamoxifen      06/12/2014 - 07/03/2014 Chemotherapy    Taxotere added secondary to persistent abdominal and back pain      06/17/2014 - 06/19/2014 Hospital Admission    Neutropenia, fever, diarrhea, nausea,  vomiting      06/20/2014 - 07/10/2014 Radiation Therapy    Dr. Thea Silversmith 12 fractions to L3-S3 (30 Gy) and left scapula (20 Gy).       07/03/2014 Adverse Reaction    Perjeta- induced diarrhea.  Perjeta discontinued      07/16/2014 - 07/20/2014 Hospital Admission    Electrolyte abnormalities, and diarrhea.  Suspect Perjeta-induced diarrhea.  Negative GI work-up.      07/24/2014 - 08/19/2015 Chemotherapy    Herceptin/Tamoxifen/Xgeva      08/21/2014 Imaging    MUGA- Left ventricular ejection fraction equals 71%.      08/24/2014 PET scan    Dramatic reduction in metabolic activity of the widespread liver metastasis. Liver metastasis now have metabolic activity equal to background normal liver activity. Liver has a nodular contour. Marked reduction in metabolic activity of skeletal lesions..      10/05/2014 Progression    Widespread metastatic disease to the brain as described. Between 20 and 30 intracranial metastatic deposits are now seen. No midline shift or incipient herniation      10/09/2014 - 10/26/2014 Radiation Therapy    Whole Brain XRT      11/14/2014 Imaging    MUGA- LVEF 67%      02/13/2015 Imaging    MUGA- LVEF 59%      02/15/2015 Treatment Plan Change    Due  to declining LVEF, will hold Herceptin per PI guidelines.      04/12/2015 -  Chemotherapy    Herceptin restarted      06/02/2015 - 06/08/2015 Hospital Admission    Pneumonia      07/05/2015 Progression     PET/CT concern for mild progression of skeletal metastasis with several lesions within the spine and 1 lesion in the Left iliac wing with mild to moderate metabolic activity new from prior. Rising CA 27-29      07/16/2015 - 10/23/2015 Anti-estrogen oral therapy    Arimidex      07/16/2015 Imaging    MRI brain with satisfactory post treatment apperance of brain. interval resolved enhancing R caudate metastasis, minimal punctate residual enhancing metastatic disease at the inferior L cerebellum. No new  metastatic disease or new intracranial abnormality      07/19/2015 Treatment Plan Change    Discontinue Tamoxifen, Zoladex plus Arimidex.       08/27/2015 Procedure    Laparoscopic bilateral salpingo-oophorectomy by Dr. Gaetano Net      10/17/2015 PET scan    Osseous metastatic disease appears slightly progressive based on a new right scapular lesion and increased uptake within lesions in the thoracic spine, left iliac wing and  proximal right femur.      10/17/2015 Progression    Slight progression on PET scan imaging.      10/18/2015 Imaging    REsolved enhancing metastatic disease to the brain status post WBXRT      10/23/2015 - 02/12/2016 Adjuvant Chemotherapy    Faslodex loading followed by maintenance dose.  (Herceptin continued)      11/04/2015 Imaging    MUGA- LEFT ventricular ejection fraction 51% slightly decreased in a 57% on the previous exam.      12/24/2015 Treatment Plan Change    Zometa every 28 days.  Xgeva discontinued.        01/01/2016 Imaging    MUGA- Left ventricular ejection fraction equals 57.9%. This is increased from 51.1% previously.      02/03/2016 PET scan    1. Mixed metabolic changes in the scattered hypermetabolic sclerotic osseous metastases throughout the axial and proximal appendicular skeleton as detailed above. 2. No new sites of hypermetabolic metastatic disease. Stable pseudo-cirrhotic appearance of the liver due to treated liver metastases with no hypermetabolic liver metastases.       02/03/2016 Progression    PET shows mixed osseous response.  Some lesions more hypermetabolic, others improved.      02/05/2016 Treatment Plan Change    D/C Herceptin.  Faslodex as scheduled on 02/12/2016, then discontinued.  Continue Zometa.      02/05/2016 Treatment Plan Change    Prescriptions for Xeloda 7 days on and 7 days off and Tykerb printed and provided for authorization.      02/19/2016 -  Chemotherapy    Xeloda 2300 mg BID 7 days on and 7 days  off and Tykerb       02/24/2016 Treatment Plan Change    Xeloda dose reduced by 10% to 2000 mg BID week on and week off.      03/18/2016 Treatment Plan Change    Xeloda dose reduced to 2000 mg in AM and 1500 mg in PM 7 days on and 7 days off.      03/23/2016 Imaging    MUGA- Normal LEFT ventricular ejection fraction of 56% not significantly changed from 58% on previous exam.      05/19/2016 Imaging    MRI brain- .  No new focus of abnormal enhancement to suggest interval metastatic disease. 2. No acute intracranial abnormality. 3. Stable foci of T2 FLAIR hyperintensity in white matter and in the right caudate head compatible with posttreatment changes and treated metastasis.      05/20/2016 PET scan    1. The multiple osseous metastatic lesions shown to be hypermetabolic on the prior exam are reduced in activity or even resolved in hypermetabolic activity compared to prior, as detailed above. There are also numerous sclerotic bony lesions wedge are not currently and were not previously hypermetabolic, compatible with old non active lesions. 2. No findings of extra osseous metastatic disease currently. 3. Hepatic pseudocirrhosis. 4. Healing rib fractures. Chronic pathologic fracture the left acromion.      07/08/2016 Imaging    MUGA- Low normal LEFT ventricular ejection fraction of 53%, little changed since the 56% on 03/23/2016 but slightly decreased from the 58% on 01/01/2016.      09/07/2016 Echocardiogram    MUGA- Normal LEFT ventricular ejection fraction of 59% with normal LV wall motion.      09/07/2016 Treatment Plan Change    Xeloda dose reduced to 1500 mg BID 7 days on and 7 days off.      09/15/2016 PET scan    No significant change in diffuse sclerotic bone metastases on CT images, although several show mildly increased FDG uptake since prior study.  No evidence of soft tissue metastatic disease.        INTERVAL HISTORY:  [name] 51 y.o. female  presents for continued follow-up for metastatic breast cancer.   She is here today with her husband. Currently taking Xeloda 1500 mg in the AM and PM. She takes the Tykerb daily.  She is due for monthly Zometa today.    Her biggest complaint today is her mood and headache. She has been more tearful lately; "I feel like my emotions have been all over the place."  Endorses feeling more angry and irritable as well. Remains on Effexor XR 150 gm daily.  Reports that headaches have been worse in the past 1 week or so.  She is due for restaging MRI brain in about 2-3 weeks.  Her PET scan is also due soon as well.   She thinks the minor increased dose in Ritalin has helped her fatigue some. She is eating well. Denies any mouth sores. Does endorse "nose sores" during the "on week" of Xeloda; she applies neosporin to the lesions and it is helpful.  Denies rash; continues to have itching which is no worse than previous.    For peripheral neuropathy is better with gabapentin 2 capsules at bedtime; she is requesting a refill. For pain, she remains on MS Contin with oxycodone for breakthrough pain. She doesn't require oxycodone very often. Requesting refill of MS Contin today.    Her husband shares recent frustrations with getting the patient's Xeloda from the pharmacy due to insurance coverage.  States that her last refill of Xeloda was filed under Medicare Part B and she only has Medicare Part A.     She recently had a great time with her family at the beach. She was able to spend some time with her son and new grandbaby that came up from [place].     REVIEW OF SYSTEMS:  Review of Systems - Oncology, per HPI.      PAST MEDICAL/SURGICAL HISTORY:  Past Medical History:  Diagnosis Date  . Anticoagulated    xarelto  . Anxiety   .  Breast cancer metastasized to multiple sites [facility])    liver, brain, and bone  . Breast cancer, stage 4 [facility]) oncologist-  dr Larene [place] ([facility])   dx 12/ 2015  -- breast cancer Stage 4,  ER/HER2 +,  w/  liver, brain and  bone mets/  chemotherapy and radiation therapy  . Chronic pain syndrome    secondary to cancer   . Depression   . Drug-induced cardiomyopathy ([place])    per last MUGA (08-08-2015), ef 56.5/ per last echo 05-18-2014 ef 60-65%  . Family history of prostate cancer   . GERD (gastroesophageal reflux disease)   . History of colon polyps    07-13-2013  benign  . History of DVT (deep vein thrombosis)    07-09-2014  upper right extremity-  RIJ and right subclavian--  resolved  . History of gastritis    erosive  . History of pneumonia    HCAP 06-07-2015--  resolved per cxr 07-04-2015  . History of radiation therapy    12 fractions to L3 - S3, 30Gy and left spacula 20Gy (06-20-2014 to 07-10-2014) //  whole brain rxt (10-09-2014 to 10-26-2014)  . History of small bowel obstruction    S/P RESECTION 2008  . Migraine   . PONV (postoperative nausea and vomiting)    pt states scope patch does well   Past Surgical History:  Procedure Laterality Date  . BREAST REDUCTION SURGERY  03/17/2011   Procedure: MAMMARY REDUCTION BILATERAL (BREAST);  Surgeon: Mary A Contogiannis;  Location: [place];  Service: Plastics;  Laterality: Bilateral;  . CATARACT EXTRACTION W/ INTRAOCULAR LENS  IMPLANT, BILATERAL  2008  . CERVICAL FUSION  2003   C5 -- C6  . COLONOSCOPY N/A 07/13/2013   Procedure: COLONOSCOPY;  Surgeon: Rogene Houston, MD;  [facility];  Service: Endoscopy;  Laterality: N/A;  930  . COLONOSCOPY N/A 11/26/2014   Procedure: COLONOSCOPY;  Surgeon: Rogene Houston, MD;  [facility];  Service: Endoscopy;  Laterality: N/A;  730  . D & C HYSTEROSCOPY/  RESECTION ENDOMETRIAL MASS/  [place] ENDOMETRIAL ABLATION  04-11-2010  . DX LAPAROSCOPY W/ PARTIAL SMALL BOWEL RESECTION AND APPENDECTOMY  04-13-2007  . ESOPHAGOGASTRODUODENOSCOPY N/A 05/25/2014   Procedure: ESOPHAGOGASTRODUODENOSCOPY (EGD);  Surgeon: Rogene Houston, MD;  [facility];  Service: Endoscopy;  Laterality: N/A;  155  . KNEE ARTHROSCOPY Right 2005  . LAPAROSCOPIC ASSISTED VAGINAL HYSTERECTOMY  10-13-2010   w/ Bx Left Fallopian tube and Aspiration Right Ovarian Cyst  . LAPAROSCOPIC CHOLECYSTECTOMY  11-17-2002  . LAPAROSCOPIC SALPINGO OOPHERECTOMY Bilateral 08/26/2015   Procedure: LAPAROSCOPIC SALPINGO OOPHORECTOMY, bilateral;  Surgeon: Everlene Farrier, MD;  Location: [facility];  Service: Gynecology;  Laterality: Bilateral;  . PORTACATH PLACEMENT  05-17-2014  . REDUCTION MAMMAPLASTY    . SHOULDER ARTHROSCOPY WITH ROTATOR CUFF REPAIR Right 2002  . TRANSTHORACIC ECHOCARDIOGRAM  05-18-2014   ef 60-65%//   last MUGA  (08-08-2015)  ef 56.6%       SOCIAL HISTORY:  Social History   Social History  . Marital status: Married    Spouse name: N/A  . Number of children: N/A  . Years of education: N/A   Occupational History  . Not on file.   Social History Main Topics  . Smoking status: Former Smoker    Types: Cigarettes    Quit date: 08/20/1994  . Smokeless tobacco: Never Used  . Alcohol use Yes     Comment: Occasionally  .  Drug use: No  . Sexual activity: Not on file   Other Topics Concern  . Not on file   Social History Narrative  . No narrative on file    FAMILY HISTORY:  Family History  Problem Relation Age of Onset  . Diabetes Father   . Heart attack Maternal Grandmother 30       multiple over lifetime.  . Cancer Maternal Grandmother 21       NOS  . Prostate cancer Maternal Grandfather        dx in his 64s  . Lung cancer Paternal Grandfather        dx <50  . Lymphoma Maternal Aunt        dx in her 34s  . Melanoma Cousin 27       maternal first cousin  . Brain cancer Cousin        paternal first cousin dx under 70  . Prostate cancer Other        MGF's father  . Colon cancer Other        MGM's mother    CURRENT MEDICATIONS:  Outpatient Encounter Prescriptions as of 10/29/2016    Medication Sig  . acetaminophen (TYLENOL) 325 MG tablet Take 650 mg by mouth every 6 (six) hours as needed for moderate pain or headache.  . calcium carbonate (TUMS - DOSED IN MG ELEMENTAL CALCIUM) 500 MG chewable tablet Chew 1 tablet by mouth daily.  . Calcium-Phosphorus-Vitamin D (CALCIUM/D3 ADULT GUMMIES PO) Take 2 tablets by mouth 2 (two) times daily. dosage of calcium 1256m and vitamin d 1006m . diphenhydrAMINE (BENADRYL) 25 mg capsule Take 25 mg by mouth every 6 (six) hours as needed for itching.  . furosemide (LASIX) 20 MG tablet TAKE 1 TABLET BY MOUTH TWICE DAILY  . gabapentin (NEURONTIN) 300 MG capsule Take 2 capsules (600 mg) at bedtime.  . lidocaine-prilocaine (EMLA) cream Apply 1 application topically daily as needed (apply to port before chemo).  . LORazepam (ATIVAN) 0.5 MG tablet Take 1 tablet (0.5 mg total) by mouth every 6 (six) hours as needed for anxiety. Reported on 08/16/2015  . magic mouthwash SOLN Swish and swallow 80m56mour times a day.  . Melatonin 10 MG CAPS Take 1 tablet by mouth. Pt states she takes one tablet at night for sleep  . methylphenidate (RITALIN) 20 MG tablet Take 1 tablet (20 mg total) by mouth 2 (two) times daily. Take 1 tab in morning and 1 tab at lunch/before 1pm.  . morphine (MS CONTIN) 30 MG 12 hr tablet Take 1 tablet (30 mg total) by mouth every 12 (twelve) hours.  . ondansetron (ZOFRAN) 8 MG tablet Take 1 tablet (8 mg total) by mouth every 8 (eight) hours as needed for nausea or vomiting.  . oMarland KitchenyCODONE (OXY IR/ROXICODONE) 5 MG immediate release tablet Take 1-2 tablets (5-10 mg total) by mouth every 4 (four) hours as needed for moderate pain.  . pantoprazole (PROTONIX) 40 MG tablet TAKE 1 TABLET BY MOUTH EVERY DAY  . potassium chloride SA (K-DUR,KLOR-CON) 20 MEQ tablet TAKE 1 TABLET BY MOUTH TWICE DAILY  . promethazine (PHENERGAN) 25 MG tablet Take 1 tablet (25 mg total) by mouth every 6 (six) hours as needed for nausea or vomiting.  . silver  sulfADIAZINE (SILVADENE) 1 % cream Apply 1 application topically daily.  . XAlveda Reasons MG TABS tablet TAKE 1 TABLET BY MOUTH EVERY DAY WITH SUPPER  . XELODA 500 MG tablet Take 1500 mg PO BID, 7 days  on and 7 days off  . zolpidem (AMBIEN) 10 MG tablet TAKE 1 TABLET BY MOUTH AT BEDTIME AS NEEDED  . [DISCONTINUED] gabapentin (NEURONTIN) 300 MG capsule Take 1 capsule (300 mg total) by mouth 2 (two) times daily.  . [DISCONTINUED] lapatinib (TYKERB) 250 MG tablet Take 5 tablets (1,250 mg total) by mouth daily.  . [DISCONTINUED] methylphenidate (RITALIN) 20 MG tablet Take 1 tablet (20 mg total) by mouth 2 (two) times daily. Take 1 tab in morning and 1 tab at lunch/before 1pm.  . [DISCONTINUED] morphine (MS CONTIN) 30 MG 12 hr tablet Take 1 tablet (30 mg total) by mouth every 12 (twelve) hours.  . [DISCONTINUED] venlafaxine XR (EFFEXOR-XR) 75 MG 24 hr capsule TAKE 2 CAPSULES BY MOUTH EVERY DAY WITH BREAKFAST  . [DISCONTINUED] XELODA 500 MG tablet Take 1500 mg PO BID, 7 days on and 7 days off  . desvenlafaxine (PRISTIQ) 50 MG 24 hr tablet Take 1 tablet (50 mg total) by mouth daily.  . [DISCONTINUED] SUMAtriptan (IMITREX) 100 MG tablet Take 1 tablet (100 mg total) by mouth as needed. foir migraine   Facility-Administered Encounter Medications as of 10/29/2016  Medication  . 0.9 %  sodium chloride infusion    ALLERGIES:  No Known Allergies   PHYSICAL EXAM:  ECOG Performance status: 1 - Symptomatic, but independent.   BP 121/72 HR 101 Resp 20 Temp 99 O2 sat 98%   Physical Exam  Constitutional: She is oriented to person, place, and time and well-developed, well-nourished, and in no distress.  HENT:  Head: Normocephalic.  Mouth/Throat: Oropharynx is clear and moist. No oropharyngeal exudate.  Eyes: Conjunctivae are normal. Pupils are equal, round, and reactive to light. No scleral icterus.  Neck: Normal range of motion. Neck supple.  Cardiovascular: Normal rate, regular rhythm and normal heart  sounds.   Pulmonary/Chest: Effort normal and breath sounds normal. No respiratory distress.  Abdominal: Soft. Bowel sounds are normal. There is no tenderness.  Musculoskeletal: Normal range of motion. She exhibits no edema.  Lymphadenopathy:    She has no cervical adenopathy.       Right: No supraclavicular adenopathy present.       Left: No supraclavicular adenopathy present.  Neurological: She is alert and oriented to person, place, and time. No cranial nerve deficit. Gait normal.  Skin: Skin is warm and dry. No rash noted.  Psychiatric: Mood, memory, affect and judgment normal.  Nursing note and vitals reviewed.    LABORATORY DATA:  I have reviewed the labs as listed.  CBC    Component Value Date/Time   WBC 6.9 10/29/2016 1100   RBC 4.18 10/29/2016 1100   HGB 12.2 10/29/2016 1100   HCT 38.1 10/29/2016 1100   PLT 248 10/29/2016 1100   MCV 91.1 10/29/2016 1100   MCH 29.2 10/29/2016 1100   MCHC 32.0 10/29/2016 1100   RDW 17.9 (H) 10/29/2016 1100   LYMPHSABS 1.0 10/29/2016 1100   MONOABS 0.5 10/29/2016 1100   EOSABS 0.0 10/29/2016 1100   BASOSABS 0.0 10/29/2016 1100   CMP Latest Ref Rng & Units 10/29/2016 09/28/2016 09/02/2016  Glucose 65 - 99 mg/dL 122(H) 112(H) 124(H)  BUN 6 - 20 mg/dL _0 Creatinine 0.44 - 1.00 mg/dL 0.77 0.75 0.75  Sodium 135 - 145 mmol/L 137 140 137  Potassium 3.5 - 5.1 mmol/L 3.4(L) 3.9 3.6  Chloride 101 - 111 mmol/L 97(L) 100(L) 100(L)  CO2 22 - 32 mmol/L 30 32 29  Calcium 8.9 - 10.3 mg/dL 9.3  9.2 9.2  Total Protein 6.5 - 8.1 g/dL 7.2 - 7.2  Total Bilirubin 0.3 - 1.2 mg/dL 0.3 - 0.4  Alkaline Phos 38 - 126 U/L 93 - 91  AST 15 - 41 U/L 20 - 22  ALT 14 - 54 U/L 17 - 17     Ref. Range 10/29/2016 11:00  CA 15-3 Latest Ref Range: 0.0 - 25.0 U/mL 50.3 (H)  CA 27.29 Latest Ref Range: 0.0 - 38.6 U/mL 59.6 (H)   PENDING LABS:     DIAGNOSTIC IMAGING:  Most recent PET scan: 09/15/16       Most recent MUGA scan:  09/07/16      PATHOLOGY:  [facility]: 05/25/14     ASSESSMENT & PLAN:   Stage IV metastatic breast adenocarcinoma with liver, bone, brain, and spine mets; ER+/HER2+: -Currently taking Xeloda 1500 mg BID 7 days on and 7 days off with Tykerb daily.  -PET scan results from 09/15/16 reviewed; no significant change noted to diffuse sclerotic bone mets, although several show mildly increased FDG uptake. No evidence of soft tissue metastatic disease.    -Tumor markers more elevated than in previous months. Next restaging imaging with PET due on 11/18/16.  -Continue current treatment with Xeloda and Tykerb. Refill for Xeloda printed and given to patient advocate, Durene Cal, to send to specialty pharmacy. -Return to cancer center in 1 month for continued follow-up.  Cardiotoxic chemotherapy (Tykerb):  -Last MUGA with EF 59% on 09/07/16.  -Previous shortness of breath symptoms improved.  Remains on Lasix.  -Repeat/routine cardiac evaluation with MUGA scan due in 11/2016; will try to coordinate with her next follow-up visit.   Bone mets:  -Continue monthly Zometa. Last dose 09/30/16.  Oncology Flowsheet 09/30/2016  Zoledronic Acid (ZOMETA) IV 4 mg   -Serum calcium reviewed and adequate for administration of Zometa today.   Depression & anxiety:  -Currently on Effexor XR 150 mg daily. Ativan used prn for anxiety.  -Discussed with her that mood swings and emotional lability can be a common presentation of depression and anxiety.  She endorses being more tearful lately as well. Extensive medication history reviewed: She has tried Abilify, alprazolam, clonazepam, & Zoloft in the past.  Shared with her that we may need to make some adjustments to her anti-depressant therapy. Discussed that certainly menopause/diminished estrogen plays a role in our mood stability; chronic illness plays a large role as well, as she has been dealing with cancer for several years. Validated and normalized her concerns  today; it is not uncommon for adjustments and changes in anti-depressant therapies to be made over the course of her illness. She does not want any anti-depressant therapy that may make her gain weight.   -I will talk to pharmacist for their recommendations, but am considering switching her to Pristiq from Effexor.  Addendum:   *Given the added benefit of SSNRI therapy for helping with hot flashes in post-menopausal women, we will try switching her to Pristiq. Discussed with her that although both Effexor and Pristiq are in the same class of drugs, she may have better clinical benefit from [place].  -Discussed with Pharmacist; will plan to stop Effexor XR 150 mg and start Pristiq 50 mg daily.  If this change in medication is not helpful, then I will consider referring her to psychiatry for medication optimization.    Headache:  -Headache worse for the past 1 week. She is due for MRI brain in mid-July. I do not want to wait to complete the MRI  and will work on moving up the date of the scan to early next week for further evaluation. If negative for malignancy, then can consider giving her additional medications for headache if needed.  Suspect headache could be secondary to stress, worsening depression/anxiety.   Cancer-related fatigue:  -Improved with increase in Ritalin 20 mg BID.  Refill prescription given to patient today; will continue at current dose. In the future, I would be reluctant to further increase the dose given tachycardia and possibility of cardiotoxicity with Tykerb.  Chronic pain secondary to neoplasm:  -MS Contin effective in managing her pain.  MS Contin refilled and paper prescription given to patient.  -Continue oxycodone for breakthrough pain PRN.  -Recommended continued adequate bowel regimen to prevent opiate-induced constipation.         Dispo:  -MRI brain as soon as able.  -PET scan and MUGA scan as scheduled.  -Return to cancer center in 1 month for follow-up  and next Zometa.    All questions were answered to patient's stated satisfaction. Encouraged patient to call with any new concerns or questions before her next visit to the cancer center and we can certain see her sooner, if needed.    Plan of care discussed with Dr. Talbert Cage, who agrees with the above aforementioned.    Orders placed this encounter:  Orders Placed This Encounter  Procedures  . [place], NP [place] [phone]

## 2016-10-29 NOTE — Progress Notes (Signed)
zometa given today per orders. Patient tolerated it well without problems. Vitals stable and discharged home from clinic.

## 2016-10-29 NOTE — Patient Instructions (Signed)
[facility] at [facility] Discharge Instructions  RECOMMENDATIONS MADE BY THE CONSULTANT AND ANY TEST RESULTS WILL BE SENT TO YOUR REFERRING PHYSICIAN.  Zometa given  Follow up as scheduled.  Thank you for choosing [facility] at [facility] to provide your oncology and hematology care.  To afford each patient quality time with our provider, please arrive at least 15 minutes before your scheduled appointment time.    If you have a lab appointment with the Cancer Center please come in thru the  Main Entrance and check in at the main information desk  You need to re-schedule your appointment should you arrive 10 or more minutes late.  We strive to give you quality time with our providers, and arriving late affects you and other patients whose appointments are after yours.  Also, if you no show three or more times for appointments you may be dismissed from the clinic at the providers discretion.     Again, thank you for choosing [facility].  Our hope is that these requests will decrease the amount of time that you wait before being seen by our physicians.       _____________________________________________________________  Should you have questions after your visit to [facility], please contact our office at [phone] between the hours of 8:30 a.m. and 4:30 p.m.  Voicemails left after 4:30 p.m. will not be returned until the following business day.  For prescription refill requests, have your pharmacy contact our office.       Resources For Cancer Patients and their Caregivers ? American Cancer Society: Can assist with transportation, wigs, general needs, runs Look Good Feel Better.        1-888-227-6333 ? Cancer Care: Provides financial assistance, online support groups, medication/co-pay assistance.  1-800-813-HOPE (4673) ? [facility] Assists [place] cancer patients and their  families through emotional , educational and financial support.  336-427-4357 ? [place] DSS Where to apply for food stamps, Medicaid and utility assistance. 336-342-1394 ? RCATS: Transportation to medical appointments. 336-347-2287 ? Social Security Administration: May apply for disability if have a Stage IV cancer. 336-342-7796 1-800-772-1213 ? [place] Aging, Disability and Transit Services: Assists with nutrition, care and transit needs. 336-349-2343  Cancer Center Support Programs: @10RELATIVEDAYS@ > Cancer Support Group  2nd Tuesday of the month 1pm-2pm, Journey Room  > Creative Journey  3rd Tuesday of the month 1130am-1pm, Journey Room  > Look Good Feel Better  1st Wednesday of the month 10am-12 noon, Journey Room (Call American [employer] to register 1-800-395-5775)   

## 2016-10-30 ENCOUNTER — Telehealth (HOSPITAL_COMMUNITY): Payer: Self-pay | Admitting: Adult Health

## 2016-10-30 ENCOUNTER — Other Ambulatory Visit (HOSPITAL_COMMUNITY): Payer: Self-pay

## 2016-10-30 DIAGNOSIS — C50919 Malignant neoplasm of unspecified site of unspecified female breast: Secondary | ICD-10-CM

## 2016-10-30 LAB — CANCER ANTIGEN 15-3: CA 15-3: 50.3 U/mL — ABNORMAL HIGH (ref 0.0–25.0)

## 2016-10-30 LAB — CANCER ANTIGEN 27.29: CA 27.29: 59.6 U/mL — AB (ref 0.0–38.6)

## 2016-10-30 MED ORDER — LAPATINIB DITOSYLATE 250 MG PO TABS: 1250.0000 mg | ORAL_TABLET | Freq: Every day | ORAL | 1 refills | Status: DC

## 2016-10-30 MED ORDER — DESVENLAFAXINE SUCCINATE ER 50 MG PO TB24: 50.0000 mg | ORAL_TABLET | Freq: Every day | ORAL | 3 refills | Status: DC

## 2016-10-30 NOTE — Telephone Encounter (Signed)
Attempted to reach [name] to let her know about my plan for changing her Effexor. Unable to reach her, but left general message asking that she contact the cancer center when she can.    ----  Discussed plan with pharmacist and Dr. Irene Limbo. We will plan to stop Effexor 150 mg daily and start Pristiq 50 mg daily. There is no need to taper dose of Effexor.  While very similar in these 2 drug's pharmacodynamics, I am hopeful she will have improve clinical results in her depression, anxiety, and hot flashes.    Will discuss with her via phone in more detail when she returns the call.  Prescription for Pristiq e-scribed to patient's pharmacy.     Mike Craze, NP [place] [phone]

## 2016-10-30 NOTE — Telephone Encounter (Signed)
Patient returned my call and I was able to speak directly with her re: recommendations for switching from Effexor to [place].  She is aware of new Rx at her pharmacy that she can start whenever she is able to pick it up.  I let her know that if the Pristiq does not help improve her symptoms, then we can talk about referral to psychiatry/behavioral health for their additional expertise/recommendations. She agreed with this plan.   Also reviewed results of recent lab work with patient.  Recommended she continue current potassium supplement at current dose; K only mildly low. We will keep monitoring.  Her tumor markers are a little bit more elevated than previous as well, but restaging scans are upcoming for further evaluation.    She voiced understanding and appreciation. Encouraged her to call us with any other questions or concerns.    Mike Craze, NP [place] [phone]

## 2016-10-30 NOTE — Telephone Encounter (Signed)
Received refill request from patients pharmacy for Tykerb. Reviewed with provider, chart checked and refilled.

## 2016-11-02 ENCOUNTER — Ambulatory Visit (HOSPITAL_COMMUNITY)
Admission: RE | Admit: 2016-11-02 | Discharge: 2016-11-02 | Disposition: A | Discharge status: Home / Self Care | Payer: 59 | Source: Ambulatory Visit | Attending: Oncology | Admitting: Oncology

## 2016-11-02 DIAGNOSIS — C50919 Malignant neoplasm of unspecified site of unspecified female breast: Secondary | ICD-10-CM

## 2016-11-02 MED ORDER — GADOBENATE DIMEGLUMINE 529 MG/ML IV SOLN
20.0000 mL | Freq: Once | INTRAVENOUS | Status: AC | PRN
  Administered 2016-11-02: 20 mL via INTRAVENOUS

## 2016-11-16 ENCOUNTER — Ambulatory Visit (HOSPITAL_COMMUNITY): Payer: 59

## 2016-11-16 ENCOUNTER — Telehealth (HOSPITAL_COMMUNITY): Payer: Self-pay | Admitting: Oncology

## 2016-11-16 NOTE — Telephone Encounter (Signed)
RTNED PTS CALL RE OUTSTANDING BAL

## 2016-11-17 ENCOUNTER — Encounter: Payer: Self-pay | Admitting: *Deleted

## 2016-11-17 NOTE — Progress Notes (Signed)
[place] Clinical Social Work  Clinical Social Work was referred by Corporate investment banker for assessment of psychosocial needs due to financial concerns.  Clinical Social Worker contacted patient at home to offer support and assess for needs.  Pt shared concerns that her medicare started recently, but she was unaware of the need for additional coverage and as a result has increased healthcare costs. CSW provided supportive listening and reviewed additional resources to assist. CSW feels pt may be eligible for assistance through [employer] [place] and other organizations. CSW printed off application and also put together packet of additional supports.  Pt having increased anxiety and depression currently and this financial concern has made this worse. CSW reviewed common emotions at various points in illness and how to revisit some of those helpful coping techniques during stress. Pt agreed to revisit past support programs and CSW strongly encouraged additional support to assist. Pt found discussing concerns helpful as well. She feels medication change has been helpful and will revisit depression and anxiety concerns at next visit.  CSW to follow closely.     Clinical Social Work interventions: Administrator education and referral   [name], LCSW, OSW-C [place] [facility]   [phone]

## 2016-11-18 ENCOUNTER — Ambulatory Visit (HOSPITAL_COMMUNITY): Payer: 59

## 2016-11-23 ENCOUNTER — Other Ambulatory Visit (HOSPITAL_COMMUNITY): Payer: Self-pay | Admitting: Oncology

## 2016-11-23 DIAGNOSIS — C7951 Secondary malignant neoplasm of bone: Secondary | ICD-10-CM

## 2016-11-23 DIAGNOSIS — C50911 Malignant neoplasm of unspecified site of right female breast: Secondary | ICD-10-CM

## 2016-11-23 MED ORDER — ZOLEDRONIC ACID 4 MG/100ML IV SOLN: 4.0000 mg | INTRAVENOUS | 11 refills | Status: DC

## 2016-11-25 ENCOUNTER — Ambulatory Visit (HOSPITAL_COMMUNITY): Payer: 59

## 2016-11-26 ENCOUNTER — Other Ambulatory Visit (HOSPITAL_COMMUNITY): Payer: Self-pay | Admitting: Adult Health

## 2016-11-26 ENCOUNTER — Other Ambulatory Visit (HOSPITAL_COMMUNITY): Payer: 59

## 2016-11-26 ENCOUNTER — Encounter (HOSPITAL_COMMUNITY): Payer: Self-pay | Admitting: Oncology

## 2016-11-26 ENCOUNTER — Encounter (HOSPITAL_BASED_OUTPATIENT_CLINIC_OR_DEPARTMENT_OTHER): Payer: 59

## 2016-11-26 ENCOUNTER — Encounter (HOSPITAL_COMMUNITY): Payer: 59 | Attending: Oncology | Admitting: Oncology

## 2016-11-26 VITALS — BP 105/66 | HR 97 | Temp 97.4°F | Resp 18 | Wt 247.8 lb

## 2016-11-26 DIAGNOSIS — F329 Major depressive disorder, single episode, unspecified: Secondary | ICD-10-CM

## 2016-11-26 DIAGNOSIS — C229 Malignant neoplasm of liver, not specified as primary or secondary: Secondary | ICD-10-CM

## 2016-11-26 DIAGNOSIS — C7931 Secondary malignant neoplasm of brain: Secondary | ICD-10-CM

## 2016-11-26 DIAGNOSIS — C7951 Secondary malignant neoplasm of bone: Secondary | ICD-10-CM

## 2016-11-26 DIAGNOSIS — C787 Secondary malignant neoplasm of liver and intrahepatic bile duct: Secondary | ICD-10-CM | POA: Diagnosis not present

## 2016-11-26 DIAGNOSIS — Z0000 Encounter for general adult medical examination without abnormal findings: Secondary | ICD-10-CM

## 2016-11-26 DIAGNOSIS — R5382 Chronic fatigue, unspecified: Secondary | ICD-10-CM

## 2016-11-26 DIAGNOSIS — C50919 Malignant neoplasm of unspecified site of unspecified female breast: Secondary | ICD-10-CM

## 2016-11-26 DIAGNOSIS — C50911 Malignant neoplasm of unspecified site of right female breast: Secondary | ICD-10-CM

## 2016-11-26 LAB — CBC WITH DIFFERENTIAL/PLATELET
BASOS PCT: 0 %
Basophils Absolute: 0 10*3/uL (ref 0.0–0.1)
EOS ABS: 0 10*3/uL (ref 0.0–0.7)
Eosinophils Relative: 0 %
HCT: 37.6 % (ref 36.0–46.0)
Hemoglobin: 12.2 g/dL (ref 12.0–15.0)
Lymphocytes Relative: 16 %
Lymphs Abs: 1.2 10*3/uL (ref 0.7–4.0)
MCH: 29.2 pg (ref 26.0–34.0)
MCHC: 32.4 g/dL (ref 30.0–36.0)
MCV: 90 fL (ref 78.0–100.0)
MONO ABS: 0.5 10*3/uL (ref 0.1–1.0)
MONOS PCT: 6 %
Neutro Abs: 5.7 10*3/uL (ref 1.7–7.7)
Neutrophils Relative %: 78 %
Platelets: 247 10*3/uL (ref 150–400)
RBC: 4.18 MIL/uL (ref 3.87–5.11)
RDW: 18.1 % — AB (ref 11.5–15.5)
WBC: 7.3 10*3/uL (ref 4.0–10.5)

## 2016-11-26 LAB — COMPREHENSIVE METABOLIC PANEL
ALBUMIN: 3.9 g/dL (ref 3.5–5.0)
ALT: 17 U/L (ref 14–54)
ANION GAP: 10 (ref 5–15)
AST: 23 U/L (ref 15–41)
Alkaline Phosphatase: 99 U/L (ref 38–126)
BILIRUBIN TOTAL: 0.6 mg/dL (ref 0.3–1.2)
BUN: 11 mg/dL (ref 6–20)
CO2: 29 mmol/L (ref 22–32)
Calcium: 9.2 mg/dL (ref 8.9–10.3)
Chloride: 96 mmol/L — ABNORMAL LOW (ref 101–111)
Creatinine, Ser: 0.92 mg/dL (ref 0.44–1.00)
GFR calc non Af Amer: >60 mL/min (ref >60)
Glucose, Bld: 122 mg/dL — ABNORMAL HIGH (ref 65–99)
POTASSIUM: 2.9 mmol/L — AB (ref 3.5–5.1)
SODIUM: 135 mmol/L (ref 135–145)
TOTAL PROTEIN: 7.4 g/dL (ref 6.5–8.1)

## 2016-11-26 MED ORDER — MORPHINE SULFATE ER 30 MG PO TBCR: 30.0000 mg | EXTENDED_RELEASE_TABLET | Freq: Two times a day (BID) | ORAL | 0 refills | Status: DC

## 2016-11-26 MED ORDER — ZOLEDRONIC ACID 4 MG/100ML IV SOLN
4.0000 mg | Freq: Once | INTRAVENOUS | Status: AC
  Administered 2016-11-26: 4 mg via INTRAVENOUS
  Filled 2016-11-26: qty 100

## 2016-11-26 MED ORDER — METHYLPHENIDATE HCL 20 MG PO TABS: 20.0000 mg | ORAL_TABLET | Freq: Two times a day (BID) | ORAL | 0 refills | Status: DC

## 2016-11-26 MED ORDER — HEPARIN SOD (PORK) LOCK FLUSH 100 UNIT/ML IV SOLN
INTRAVENOUS | Status: AC
  Filled 2016-11-26: qty 5

## 2016-11-26 MED ORDER — SODIUM CHLORIDE 0.9 % IV SOLN
INTRAVENOUS | Status: DC
  Administered 2016-11-26: 16:00:00 via INTRAVENOUS

## 2016-11-26 MED ORDER — HEPARIN SOD (PORK) LOCK FLUSH 100 UNIT/ML IV SOLN
500.0000 [IU] | Freq: Once | INTRAVENOUS | Status: AC
  Administered 2016-11-26: 500 [IU] via INTRAVENOUS

## 2016-11-26 NOTE — Progress Notes (Signed)
Patient tolerated infusion today without incidence. Patient discharged ambulatory and in stable condition. Patient to follow up as scheduled.

## 2016-11-26 NOTE — Assessment & Plan Note (Addendum)
Stage IV adenocarcinoma of the breast, ER+, HER2+ with brain and bone metastases.  S/P BSO by Dr. Gaetano Net on 08/27/2015.  Labs today: CBC diff, CMET, CA 27.29, CA 15-3.  I personally reviewed and went over laboratory results with the patient.  The results are noted within this dictation.  I personally reviewed and went over radiographic studies with the patient.  The results are noted within this dictation.  I personally reviewed the images in PACS.  MRI brain is negative for any brain metastases.  She was scheduled for PET imaging last week, but this was cancelled due to patient's insurance change and cost of imaging.  This will be rescheduled in the future.  MUGA was scheduled today, but cancelled for the same reason as the PET scan.  She is due for bisphosphonate therapy today.  Labs satisfy treatment parameters for this.  Nursing, in accordance with bisphosphonate administration protocol, will monitor for acute side effects/toxicities associated with bisphosphonate administration today.  Labs in 4 weeks: CBC diff, CMET, CA 27.29, and CA 15-3.  Continue current therapy: Xeloda/Tykerb/Zometa.  Once insurance issues are worked out, will need to schedule PET scan and MUGA scan.  Patient is very reliable.  I am advised that insurance issues will be resolved by Sept 1.  Return in 4 weeks for follow-up and next Zometa infusion.  At this visit, we will schedule PET and MUGA after Sept 1.

## 2016-11-26 NOTE — Patient Instructions (Addendum)
[place] at [facility] Discharge Instructions  RECOMMENDATIONS MADE BY THE CONSULTANT AND ANY TEST RESULTS WILL BE SENT TO YOUR REFERRING PHYSICIAN.  You were seen today by Kirby Crigler PA-C. Refills given today on Morphine and Ritallin. Labs and Zometa today. Return in 4 weeks for labs, Zometa and follow up.    Thank you for choosing [place] at [facility] to provide your oncology and hematology care.  To afford each patient quality time with our provider, please arrive at least 15 minutes before your scheduled appointment time.    If you have a lab appointment with the [place] please come in thru the  Main Entrance and check in at the main information desk  You need to re-schedule your appointment should you arrive 10 or more minutes late.  We strive to give you quality time with our providers, and arriving late affects you and other patients whose appointments are after yours.  Also, if you no show three or more times for appointments you may be dismissed from the clinic at the providers discretion.     Again, thank you for choosing [facility].  Our hope is that these requests will decrease the amount of time that you wait before being seen by our physicians.       _____________________________________________________________  Should you have questions after your visit to [facility], please contact our office at [phone] between the hours of 8:30 a.m. and 4:30 p.m.  Voicemails left after 4:30 p.m. will not be returned until the following business day.  For prescription refill requests, have your pharmacy contact our office.       Resources For Cancer Patients and their Caregivers ? American Cancer Society: Can assist with transportation, wigs, general needs, runs Look Good Feel Better.        626-081-8577 ? Cancer Care: Provides financial assistance, online support groups, medication/co-pay  assistance.  1-800-813-HOPE 386-101-3525) ? [facility] Assists [place] cancer patients and their families through emotional , educational and financial support.  (234) 404-0484 ? [place] DSS Where to apply for food stamps, Medicaid and utility assistance. 929-663-3861 ? RCATS: Transportation to medical appointments. 670-492-3821 ? Social Security Administration: May apply for disability if have a Stage IV cancer. (714)869-4529 308 880 2797 ? [employer], Disability and Transit Services: Assists with nutrition, care and transit needs. [place] Support Programs: @10RELATIVEDAYS @ > Cancer Support Group  2nd Tuesday of the month 1pm-2pm, Journey Room  > Creative Journey  3rd Tuesday of the month 1130am-1pm, Journey Room  > Look Good Feel Better  1st Wednesday of the month 10am-12 noon, Journey Room (Call [place] to register [phone])

## 2016-11-26 NOTE — Progress Notes (Signed)
Allison Labrum, MD [place] [address]  Malignant neoplasm of right breast, stage 4 (HCC)  Reactive depression  Breast cancer, stage 4, unspecified laterality (HCC) - Plan: methylphenidate (RITALIN) 20 MG tablet, morphine (MS CONTIN) 30 MG 12 hr tablet  Chronic fatigue - Plan: methylphenidate (RITALIN) 20 MG tablet, morphine (MS CONTIN) 30 MG 12 hr tablet  Bone metastases (HCC) - Plan: morphine (MS CONTIN) 30 MG 12 hr tablet  Preventative health care - Plan: morphine (MS CONTIN) 30 MG 12 hr tablet  Adenocarcinoma determined by biopsy of liver ([place]) - Plan: morphine (MS CONTIN) 30 MG 12 hr tablet  CURRENT THERAPY: Xeloda 1500 mg PO BID; 7 days on and 7 days off.  Tykerb daily.  Zometa monthly.  INTERVAL HISTORY: [name] 51 y.o. female returns for followup of Stage IV metastatic breast cancer with liver, bone, brain and spine mets.  ER+/HER2+.    Breast cancer, stage 4 ([place])   04/25/2014 Initial Diagnosis    Breast cancer, stage 4      04/25/2014 Imaging    CT abdomen/pelvis with widespread metastatic disease to the liver, multiple lytic lesions throughout spine and pelvis. No FX or epidural tumor identified      04/26/2014 Imaging    CT head unremarkable      04/26/2014 Imaging    CT chest with no lung mass or pulmonary nodules, no adenopathy. Lytic bone lesions, right 2nd rib      04/27/2014 Initial Biopsy    U/S guided liver biopsy, lesion in anterior and inferior left hepatic lobe biopsied      04/27/2014 Pathology Results    Metastatic adenocarcinoma, CK7, ER+, patchy positivity with PR. Possible primary includes breast, less likely gynecologic      05/15/2014 Mammogram    BI-RADS CATEGORY  2: Benign Finding(s)      05/16/2014 PET scan    1. Intensely hypermetabolic hepatic metastasis. 2. Widespread hypermetabolic skeletal lesions. 3. No primary adenocarcinoma identified by FDG PET imaging.      05/19/2014 Imaging    MUGA- Left  ventricular ejection fracture greater than 70%.      05/21/2014 Breast MRI    No suspicious masses or enhancement within the breasts. No axillary adenopathy.      05/22/2014 - 07/03/2014 Antibody Plan    Herceptin/Perjeta/Tamoxifen      06/12/2014 - 07/03/2014 Chemotherapy    Taxotere added secondary to persistent abdominal and back pain      06/17/2014 - 06/19/2014 Hospital Admission    Neutropenia, fever, diarrhea, nausea, vomiting      06/20/2014 - 07/10/2014 Radiation Therapy    Dr. Thea Silversmith 12 fractions to L3-S3 (30 Gy) and left scapula (20 Gy).       07/03/2014 Adverse Reaction    Perjeta- induced diarrhea.  Perjeta discontinued      07/16/2014 - 07/20/2014 Hospital Admission    Electrolyte abnormalities, and diarrhea.  Suspect Perjeta-induced diarrhea.  Negative GI work-up.      07/24/2014 - 08/19/2015 Chemotherapy    Herceptin/Tamoxifen/Xgeva      08/21/2014 Imaging    MUGA- Left ventricular ejection fraction equals 71%.      08/24/2014 PET scan    Dramatic reduction in metabolic activity of the widespread liver metastasis. Liver metastasis now have metabolic activity equal to background normal liver activity. Liver has a nodular contour. Marked reduction in metabolic activity of skeletal lesions..      10/05/2014 Progression  Widespread metastatic disease to the brain as described. Between 20 and 30 intracranial metastatic deposits are now seen. No midline shift or incipient herniation      10/09/2014 - 10/26/2014 Radiation Therapy    Whole Brain XRT      11/14/2014 Imaging    MUGA- LVEF 67%      02/13/2015 Imaging    MUGA- LVEF 59%      02/15/2015 Treatment Plan Change    Due to declining LVEF, will hold Herceptin per PI guidelines.      04/12/2015 -  Chemotherapy    Herceptin restarted      06/02/2015 - 06/08/2015 Hospital Admission    Pneumonia      07/05/2015 Progression     PET/CT concern for mild progression of skeletal metastasis with several lesions  within the spine and 1 lesion in the Left iliac wing with mild to moderate metabolic activity new from prior. Rising CA 27-29      07/16/2015 - 10/23/2015 Anti-estrogen oral therapy    Arimidex      07/16/2015 Imaging    MRI brain with satisfactory post treatment apperance of brain. interval resolved enhancing R caudate metastasis, minimal punctate residual enhancing metastatic disease at the inferior L cerebellum. No new metastatic disease or new intracranial abnormality      07/19/2015 Treatment Plan Change    Discontinue Tamoxifen, Zoladex plus Arimidex.       08/27/2015 Procedure    Laparoscopic bilateral salpingo-oophorectomy by Dr. Gaetano Net      10/17/2015 PET scan    Osseous metastatic disease appears slightly progressive based on a new right scapular lesion and increased uptake within lesions in the thoracic spine, left iliac wing and  proximal right femur.      10/17/2015 Progression    Slight progression on PET scan imaging.      10/18/2015 Imaging    REsolved enhancing metastatic disease to the brain status post WBXRT      10/23/2015 - 02/12/2016 Adjuvant Chemotherapy    Faslodex loading followed by maintenance dose.  (Herceptin continued)      11/04/2015 Imaging    MUGA- LEFT ventricular ejection fraction 51% slightly decreased in a 57% on the previous exam.      12/24/2015 Treatment Plan Change    Zometa every 28 days.  Xgeva discontinued.        01/01/2016 Imaging    MUGA- Left ventricular ejection fraction equals 57.9%. This is increased from 51.1% previously.      02/03/2016 PET scan    1. Mixed metabolic changes in the scattered hypermetabolic sclerotic osseous metastases throughout the axial and proximal appendicular skeleton as detailed above. 2. No new sites of hypermetabolic metastatic disease. Stable pseudo-cirrhotic appearance of the liver due to treated liver metastases with no hypermetabolic liver metastases.       02/03/2016 Progression    PET shows  mixed osseous response.  Some lesions more hypermetabolic, others improved.      02/05/2016 Treatment Plan Change    D/C Herceptin.  Faslodex as scheduled on 02/12/2016, then discontinued.  Continue Zometa.      02/05/2016 Treatment Plan Change    Prescriptions for Xeloda 7 days on and 7 days off and Tykerb printed and provided for authorization.      02/19/2016 -  Chemotherapy    Xeloda 2300 mg BID 7 days on and 7 days off and Tykerb       02/24/2016 Treatment Plan Change    Xeloda dose reduced by 10%  to 2000 mg BID week on and week off.      03/18/2016 Treatment Plan Change    Xeloda dose reduced to 2000 mg in AM and 1500 mg in PM 7 days on and 7 days off.      03/23/2016 Imaging    MUGA- Normal LEFT ventricular ejection fraction of 56% not significantly changed from 58% on previous exam.      05/19/2016 Imaging    MRI brain- . No new focus of abnormal enhancement to suggest interval metastatic disease. 2. No acute intracranial abnormality. 3. Stable foci of T2 FLAIR hyperintensity in white matter and in the right caudate head compatible with posttreatment changes and treated metastasis.      05/20/2016 PET scan    1. The multiple osseous metastatic lesions shown to be hypermetabolic on the prior exam are reduced in activity or even resolved in hypermetabolic activity compared to prior, as detailed above. There are also numerous sclerotic bony lesions wedge are not currently and were not previously hypermetabolic, compatible with old non active lesions. 2. No findings of extra osseous metastatic disease currently. 3. Hepatic pseudocirrhosis. 4. Healing rib fractures. Chronic pathologic fracture the left acromion.      07/08/2016 Imaging    MUGA- Low normal LEFT ventricular ejection fraction of 53%, little changed since the 56% on 03/23/2016 but slightly decreased from the 58% on 01/01/2016.      09/07/2016 Echocardiogram    MUGA- Normal LEFT ventricular ejection  fraction of 59% with normal LV wall motion.      09/07/2016 Treatment Plan Change    Xeloda dose reduced to 1500 mg BID 7 days on and 7 days off.      09/15/2016 PET scan    No significant change in diffuse sclerotic bone metastases on CT images, although several show mildly increased FDG uptake since prior study.  No evidence of soft tissue metastatic disease.      11/02/2016 Imaging    MRI brain- No change from the prior study. Previously noted metastatic deposits have been treated. No new lesions identified.       HPI Elements   Location: Breast cancer  Quality: Adenocarcinoma, ER/HER2 POSITIVE  Severity: Stage IV  Duration: Biopsy proven on 04/27/2014 with liver biopsy  Context: Undiscovered primary tumor, S/P B/L breast reduction with negative primary tumor with pathology re-check after diagnosis  Timing:   Modifying Factors: Brain mets  Associated Signs & Symptoms: Fatigue/tiredness   She reports fatigue and rates her energy level at 25%. She reports a decrease in her appetite to 25%.  Her weight is stable.  She denies any new pain.  Review of Systems  Constitutional: Positive for malaise/fatigue. Negative for chills, fever and weight loss.  HENT: Negative.   Eyes: Negative.   Respiratory: Negative.  Negative for cough.   Cardiovascular: Negative.  Negative for chest pain.  Gastrointestinal: Positive for nausea. Negative for blood in stool, constipation, diarrhea, melena and vomiting.  Genitourinary: Negative.   Musculoskeletal: Negative.   Skin: Positive for itching.  Neurological: Positive for dizziness and sensory change. Negative for weakness.  Endo/Heme/Allergies: Negative.   Psychiatric/Behavioral: Negative.     Past Medical History:  Diagnosis Date  . Anticoagulated    xarelto  . Anxiety   . Breast cancer metastasized to multiple sites [facility])    liver, brain, and bone  . Breast cancer, stage 4 [facility]) oncologist-  dr Larene [place] ([facility])   dx 12/ 2015 -- breast cancer Stage  4,  ER/HER2 +,  w/  liver, brain and  bone mets/  chemotherapy and radiation therapy  . Chronic pain syndrome    secondary to cancer   . Depression   . Drug-induced cardiomyopathy ([place])    per last MUGA (08-08-2015), ef 56.5/ per last echo 05-18-2014 ef 60-65%  . Family history of prostate cancer   . GERD (gastroesophageal reflux disease)   . History of colon polyps    07-13-2013  benign  . History of DVT (deep vein thrombosis)    07-09-2014  upper right extremity-  RIJ and right subclavian--  resolved  . History of gastritis    erosive  . History of pneumonia    HCAP 06-07-2015--  resolved per cxr 07-04-2015  . History of radiation therapy    12 fractions to L3 - S3, 30Gy and left spacula 20Gy (06-20-2014 to 07-10-2014) //  whole brain rxt (10-09-2014 to 10-26-2014)  . History of small bowel obstruction    S/P RESECTION 2008  . Migraine   . PONV (postoperative nausea and vomiting)    pt states scope patch does well    Past Surgical History:  Procedure Laterality Date  . BREAST REDUCTION SURGERY  03/17/2011   Procedure: MAMMARY REDUCTION BILATERAL (BREAST);  Surgeon: Mary A Contogiannis;  Location: [place];  Service: Plastics;  Laterality: Bilateral;  . CATARACT EXTRACTION W/ INTRAOCULAR LENS  IMPLANT, BILATERAL  2008  . CERVICAL FUSION  2003   C5 -- C6  . COLONOSCOPY N/A 07/13/2013   Procedure: COLONOSCOPY;  Surgeon: Rogene Houston, MD;  [facility];  Service: Endoscopy;  Laterality: N/A;  930  . COLONOSCOPY N/A 11/26/2014   Procedure: COLONOSCOPY;  Surgeon: Rogene Houston, MD;  [facility];  Service: Endoscopy;  Laterality: N/A;  730  . D & C HYSTEROSCOPY/  RESECTION ENDOMETRIAL MASS/  [place] ENDOMETRIAL ABLATION  04-11-2010  . DX LAPAROSCOPY W/ PARTIAL SMALL BOWEL RESECTION AND APPENDECTOMY  04-13-2007  . ESOPHAGOGASTRODUODENOSCOPY N/A 05/25/2014   Procedure: ESOPHAGOGASTRODUODENOSCOPY  (EGD);  Surgeon: Rogene Houston, MD;  [facility];  Service: Endoscopy;  Laterality: N/A;  155  . KNEE ARTHROSCOPY Right 2005  . LAPAROSCOPIC ASSISTED VAGINAL HYSTERECTOMY  10-13-2010   w/ Bx Left Fallopian tube and Aspiration Right Ovarian Cyst  . LAPAROSCOPIC CHOLECYSTECTOMY  11-17-2002  . LAPAROSCOPIC SALPINGO OOPHERECTOMY Bilateral 08/26/2015   Procedure: LAPAROSCOPIC SALPINGO OOPHORECTOMY, bilateral;  Surgeon: Everlene Farrier, MD;  Location: [facility];  Service: Gynecology;  Laterality: Bilateral;  . PORTACATH PLACEMENT  05-17-2014  . REDUCTION MAMMAPLASTY    . SHOULDER ARTHROSCOPY WITH ROTATOR CUFF REPAIR Right 2002  . TRANSTHORACIC ECHOCARDIOGRAM  05-18-2014   ef 60-65%//   last MUGA  (08-08-2015)  ef 56.6%      Family History  Problem Relation Age of Onset  . Diabetes Father   . Heart attack Maternal Grandmother 30       multiple over lifetime.  . Cancer Maternal Grandmother 52       NOS  . Prostate cancer Maternal Grandfather        dx in his 55s  . Lung cancer Paternal Grandfather        dx <50  . Lymphoma Maternal Aunt        dx in her 36s  . Melanoma Cousin 58       maternal first cousin  . Brain cancer Cousin        paternal first cousin dx  under 50  . Prostate cancer Other        MGF's father  . Colon cancer Other        MGM's mother    Social History   Social History  . Marital status: Married    Spouse name: N/A  . Number of children: N/A  . Years of education: N/A   Social History Main Topics  . Smoking status: Former Smoker    Types: Cigarettes    Quit date: 08/20/1994  . Smokeless tobacco: Never Used  . Alcohol use Yes     Comment: Occasionally  . Drug use: No  . Sexual activity: Not Asked   Other Topics Concern  . None   Social History Narrative  . None     PHYSICAL EXAMINATION  ECOG PERFORMANCE STATUS: 1 - Symptomatic but completely ambulatory  There were no vitals filed for this visit.  Vitals - 1 value  per visit 11/20/4578  SYSTOLIC 998  DIASTOLIC 65  Pulse 89  Temperature   Respirations 20  Weight (lb) 247.8    GENERAL:alert, no distress, well nourished, well developed, comfortable, cooperative, obese, smiling and in chemo-recliner, accompanied by husband SKIN: skin color, texture, turgor are normal, no rashes or significant lesions HEAD: Normocephalic, No masses, lesions, tenderness or abnormalities EYES: normal, EOMI, Conjunctiva are pink and non-injected EARS: External ears normal OROPHARYNX:lips, buccal mucosa, and tongue normal  NECK: supple, trachea midline LYMPH:  no palpable lymphadenopathy BREAST:not examined LUNGS: clear to auscultation and percussion HEART: regular rate & rhythm, no murmurs and no gallops ABDOMEN:abdomen soft, non-tender and normal bowel sounds BACK: Back symmetric, no curvature. EXTREMITIES:less then 2 second capillary refill, no joint deformities, effusion, or inflammation, no skin discoloration, no cyanosis  NEURO: alert & oriented x 3 with fluent speech, no focal motor/sensory deficits, gait normal   LABORATORY DATA: CBC    Component Value Date/Time   WBC 7.3 11/26/2016 1415   RBC 4.18 11/26/2016 1415   HGB 12.2 11/26/2016 1415   HCT 37.6 11/26/2016 1415   PLT 247 11/26/2016 1415   MCV 90.0 11/26/2016 1415   MCH 29.2 11/26/2016 1415   MCHC 32.4 11/26/2016 1415   RDW 18.1 (H) 11/26/2016 1415   LYMPHSABS 1.2 11/26/2016 1415   MONOABS 0.5 11/26/2016 1415   EOSABS 0.0 11/26/2016 1415   BASOSABS 0.0 11/26/2016 1415      Chemistry      Component Value Date/Time   NA 135 11/26/2016 1415   K 2.9 (L) 11/26/2016 1415   CL 96 (L) 11/26/2016 1415   CO2 29 11/26/2016 1415   BUN 11 11/26/2016 1415   CREATININE 0.92 11/26/2016 1415      Component Value Date/Time   CALCIUM 9.2 11/26/2016 1415   ALKPHOS 99 11/26/2016 1415   AST 23 11/26/2016 1415   ALT 17 11/26/2016 1415   BILITOT 0.6 11/26/2016 1415        PENDING  LABS:   RADIOGRAPHIC STUDIES:  Mr Jeri Cos PJ Contrast  Result Date: 11/02/2016 CLINICAL DATA:  Metastatic breast cancer. Whole-brain radiation and chemotherapy EXAM: MRI HEAD WITHOUT AND WITH CONTRAST TECHNIQUE: Multiplanar, multiecho pulse sequences of the brain and surrounding structures were obtained without and with intravenous contrast. CONTRAST:  67m MULTIHANCE GADOBENATE DIMEGLUMINE 529 MG/ML IV SOLN COMPARISON:  MRI 05/19/2016 FINDINGS: Brain: Negative for enhancing metastatic deposits in the brain. Stable 3 mm T2 hyperintensity in the head of the caudate on the right is unchanged compatible with treated tumor. Other metastatic deposit is  no longer enhance. No new lesions identified. Ventricle size normal. Negative for acute infarct. Mild chronic white matter changes stable. Negative for hemorrhage or mass. Vascular: Normal arterial flow void Skull and upper cervical spine: Negative Sinuses/Orbits: Negative Other: None IMPRESSION: No change from the prior study. Previously noted metastatic deposits have been treated. No new lesions identified. Electronically Signed   By: Franchot Gallo M.D.   On: 11/02/2016 17:02     PATHOLOGY:    ASSESSMENT AND PLAN:  Breast cancer, stage 4 (HCC) Stage IV adenocarcinoma of the breast, ER+, HER2+ with brain and bone metastases.  S/P BSO by Dr. Gaetano Net on 08/27/2015.  Labs today: CBC diff, CMET, CA 27.29, CA 15-3.  I personally reviewed and went over laboratory results with the patient.  The results are noted within this dictation.  I personally reviewed and went over radiographic studies with the patient.  The results are noted within this dictation.  I personally reviewed the images in PACS.  MRI brain is negative for any brain metastases.  She was scheduled for PET imaging last week, but this was cancelled due to patient's insurance change and cost of imaging.  This will be rescheduled in the future.  MUGA was scheduled today, but cancelled for the  same reason as the PET scan.  She is due for bisphosphonate therapy today.  Labs satisfy treatment parameters for this.  Nursing, in accordance with bisphosphonate administration protocol, will monitor for acute side effects/toxicities associated with bisphosphonate administration today.  Labs in 4 weeks: CBC diff, CMET, CA 27.29, and CA 15-3.  Continue current therapy: Xeloda/Tykerb/Zometa.  Once insurance issues are worked out, will need to schedule PET scan and MUGA scan.  Patient is very reliable.  I am advised that insurance issues will be resolved by Sept 1.  Return in 4 weeks for follow-up and next Zometa infusion.  At this visit, we will schedule PET and MUGA after Sept 1.  Depression Medication changed 1 month ago from Effexor to [place].   ORDERS PLACED FOR THIS ENCOUNTER: No orders of the defined types were placed in this encounter.   MEDICATIONS PRESCRIBED THIS ENCOUNTER: Meds ordered this encounter  Medications  . venlafaxine XR (EFFEXOR-XR) 75 MG 24 hr capsule    Sig: TAKE TWO CAPSULES BY MOUTH EVERY DAY with breakfast    Refill:  3  . methylphenidate (RITALIN) 20 MG tablet    Sig: Take 1 tablet (20 mg total) by mouth 2 (two) times daily. Take 1 tab in morning and 1 tab at lunch/before 1pm.    Dispense:  60 tablet    Refill:  0    May fill on or after 12/15/16.    Order Specific Question:   Supervising Provider    Answer:   Brunetta Genera [[id number]]  . morphine (MS CONTIN) 30 MG 12 hr tablet    Sig: Take 1 tablet (30 mg total) by mouth every 12 (twelve) hours.    Dispense:  60 tablet    Refill:  0    May fill 12/06/16 or after.    Order Specific Question:   Supervising Provider    Answer:   Brunetta Genera [[id number]]    THERAPY PLAN:  Continue with treatment as outlined.  NCCN guidelines recommends the following for monitoring of metastatic breast cancer:  A. Components of monitoring:   1. Monitoring includes periodic assessment of varied  combinations of symptoms, physical examination, routine laboratory tests, imaging studies, and blood biomarkers where appropriate. Results  of monitoring are classified as response/continued response to treatment, stable disease, uncertainty regarding disease status, or progression of disease. The clinician typically must assess and balance multiple different forms of information to make a determination regarding whether disease is being controlled and the toxicity of treatment is acceptable. Sometimes, this information may be contradictory.  B. Definition of disease progression:   1. Unequivocal evidence of progression of disease by one or more of these factors is required to establish progression of disease, either because of ineffective therapy or acquired resistance of disease to an applied therapy.  Progression of disease may be identified through evidence of growth or worsening of disease at previously known sites of disease and/or of the occurrence of new sites of metastatic disease.   2. Findings concerning for progression of disease include:    A. Worsening symptoms such as pain or dyspnea    B. Evidence of worsening or new disease on physical examination.    C. Declining performance status    D. Unexplained weight loss    E. Increasing Alkaline phosphatase, ALT, AST, or bilirubin    F. Hypercalcemia    G. New radiographic abnormality or increase in the size of pre-existing radiographic abnormality.    H. New areas of abnormality on functional imaging (eg, bone scan, PET/CT scan)    I. Increasing tumor markers (eg, CEA, CA 15-3, CA27.29)   All questions were answered. The patient knows to call the clinic with any problems, questions or concerns. We can certainly see the patient much sooner if necessary.  Patient and plan discussed with Dr. Twana First and she is in agreement with the aforementioned.   This note is electronically signed by: Doy Mince 11/26/2016 3:32 PM

## 2016-11-26 NOTE — Assessment & Plan Note (Signed)
Medication changed 1 month ago from Effexor to [place].

## 2016-11-26 NOTE — Patient Instructions (Signed)
[place] at [facility] Discharge Instructions  RECOMMENDATIONS MADE BY THE CONSULTANT AND ANY TEST RESULTS WILL BE SENT TO YOUR REFERRING PHYSICIAN.  You received your Zometa today. Follow up as scheduled.  Thank you for choosing [place] at [facility] to provide your oncology and hematology care.  To afford each patient quality time with our provider, please arrive at least 15 minutes before your scheduled appointment time.    If you have a lab appointment with the [facility] please come in thru the  Main Entrance and check in at the main information desk  You need to re-schedule your appointment should you arrive 10 or more minutes late.  We strive to give you quality time with our providers, and arriving late affects you and other patients whose appointments are after yours.  Also, if you no show three or more times for appointments you may be dismissed from the clinic at the providers discretion.     Again, thank you for choosing [facility].  Our hope is that these requests will decrease the amount of time that you wait before being seen by our physicians.       _____________________________________________________________  Should you have questions after your visit to [facility], please contact our office at [phone] between the hours of 8:30 a.m. and 4:30 p.m.  Voicemails left after 4:30 p.m. will not be returned until the following business day.  For prescription refill requests, have your pharmacy contact our office.       Resources For Cancer Patients and their Caregivers ? American Cancer Society: Can assist with transportation, wigs, general needs, runs Look Good Feel Better.        814-836-7626 ? Cancer Care: Provides financial assistance, online support groups, medication/co-pay assistance.  1-800-813-HOPE 917-824-1443) ? [facility] Assists [place] cancer  patients and their families through emotional , educational and financial support.  519-854-9795 ? [place] DSS Where to apply for food stamps, Medicaid and utility assistance. 2530501885 ? [facility]: Transportation to medical appointments. 651-734-5760 ? Social Security Administration: May apply for disability if have a Stage IV cancer. (424) 173-4178 620-863-8353 ? [employer], Disability and Transit Services: Assists with nutrition, care and transit needs. [place] Support Programs: @10RELATIVEDAYS @ > Cancer Support Group  2nd Tuesday of the month 1pm-2pm, Journey Room  > Creative Journey  3rd Tuesday of the month 1130am-1pm, Journey Room  > Look Good Feel Better  1st Wednesday of the month 10am-12 noon, Journey Room (Call [facility] to register [phone])

## 2016-11-27 LAB — CANCER ANTIGEN 27.29: CAN 27.29: 65.5 U/mL — AB (ref 0.0–38.6)

## 2016-11-27 LAB — CANCER ANTIGEN 15-3: CA 15-3: 53.2 U/mL — ABNORMAL HIGH (ref 0.0–25.0)

## 2016-12-09 ENCOUNTER — Other Ambulatory Visit (HOSPITAL_COMMUNITY): Payer: Self-pay | Admitting: Oncology

## 2016-12-09 DIAGNOSIS — E876 Hypokalemia: Secondary | ICD-10-CM

## 2016-12-22 ENCOUNTER — Other Ambulatory Visit (HOSPITAL_COMMUNITY): Payer: Self-pay | Admitting: Hematology & Oncology

## 2016-12-22 DIAGNOSIS — C7951 Secondary malignant neoplasm of bone: Secondary | ICD-10-CM

## 2016-12-22 DIAGNOSIS — C50919 Malignant neoplasm of unspecified site of unspecified female breast: Secondary | ICD-10-CM

## 2016-12-24 ENCOUNTER — Other Ambulatory Visit (HOSPITAL_COMMUNITY): Payer: Self-pay | Admitting: Adult Health

## 2016-12-24 ENCOUNTER — Encounter (HOSPITAL_BASED_OUTPATIENT_CLINIC_OR_DEPARTMENT_OTHER): Payer: 59

## 2016-12-24 ENCOUNTER — Encounter (HOSPITAL_COMMUNITY): Payer: Self-pay | Admitting: Adult Health

## 2016-12-24 ENCOUNTER — Encounter (HOSPITAL_COMMUNITY): Payer: 59 | Attending: Oncology | Admitting: Adult Health

## 2016-12-24 DIAGNOSIS — G893 Neoplasm related pain (acute) (chronic): Secondary | ICD-10-CM | POA: Diagnosis not present

## 2016-12-24 DIAGNOSIS — C7951 Secondary malignant neoplasm of bone: Secondary | ICD-10-CM | POA: Insufficient documentation

## 2016-12-24 DIAGNOSIS — R530 Neoplastic (malignant) related fatigue: Secondary | ICD-10-CM | POA: Diagnosis not present

## 2016-12-24 DIAGNOSIS — C50919 Malignant neoplasm of unspecified site of unspecified female breast: Secondary | ICD-10-CM | POA: Diagnosis not present

## 2016-12-24 DIAGNOSIS — C229 Malignant neoplasm of liver, not specified as primary or secondary: Secondary | ICD-10-CM | POA: Diagnosis not present

## 2016-12-24 DIAGNOSIS — F418 Other specified anxiety disorders: Secondary | ICD-10-CM | POA: Diagnosis not present

## 2016-12-24 DIAGNOSIS — C787 Secondary malignant neoplasm of liver and intrahepatic bile duct: Secondary | ICD-10-CM | POA: Diagnosis not present

## 2016-12-24 DIAGNOSIS — C7931 Secondary malignant neoplasm of brain: Secondary | ICD-10-CM

## 2016-12-24 DIAGNOSIS — E876 Hypokalemia: Secondary | ICD-10-CM

## 2016-12-24 DIAGNOSIS — Z0000 Encounter for general adult medical examination without abnormal findings: Secondary | ICD-10-CM

## 2016-12-24 DIAGNOSIS — R5382 Chronic fatigue, unspecified: Secondary | ICD-10-CM

## 2016-12-24 LAB — CBC WITH DIFFERENTIAL/PLATELET
Basophils Absolute: 0 10*3/uL (ref 0.0–0.1)
Basophils Relative: 0 %
EOS ABS: 0 10*3/uL (ref 0.0–0.7)
EOS PCT: 0 %
HCT: 36.3 % (ref 36.0–46.0)
Hemoglobin: 11.9 g/dL — ABNORMAL LOW (ref 12.0–15.0)
LYMPHS PCT: 17 %
Lymphs Abs: 1.2 10*3/uL (ref 0.7–4.0)
MCH: 29.6 pg (ref 26.0–34.0)
MCHC: 32.8 g/dL (ref 30.0–36.0)
MCV: 90.3 fL (ref 78.0–100.0)
MONO ABS: 0.5 10*3/uL (ref 0.1–1.0)
Monocytes Relative: 7 %
Neutro Abs: 5.2 10*3/uL (ref 1.7–7.7)
Neutrophils Relative %: 76 %
PLATELETS: 248 10*3/uL (ref 150–400)
RBC: 4.02 MIL/uL (ref 3.87–5.11)
RDW: 18.2 % — AB (ref 11.5–15.5)
WBC: 6.9 10*3/uL (ref 4.0–10.5)

## 2016-12-24 LAB — COMPREHENSIVE METABOLIC PANEL
ALT: 15 U/L (ref 14–54)
AST: 21 U/L (ref 15–41)
Albumin: 3.6 g/dL (ref 3.5–5.0)
Alkaline Phosphatase: 102 U/L (ref 38–126)
Anion gap: 10 (ref 5–15)
BUN: 10 mg/dL (ref 6–20)
CHLORIDE: 99 mmol/L — AB (ref 101–111)
CO2: 29 mmol/L (ref 22–32)
Calcium: 9.2 mg/dL (ref 8.9–10.3)
Creatinine, Ser: 0.89 mg/dL (ref 0.44–1.00)
Glucose, Bld: 137 mg/dL — ABNORMAL HIGH (ref 65–99)
POTASSIUM: 3.1 mmol/L — AB (ref 3.5–5.1)
Sodium: 138 mmol/L (ref 135–145)
Total Bilirubin: 0.6 mg/dL (ref 0.3–1.2)
Total Protein: 7 g/dL (ref 6.5–8.1)

## 2016-12-24 MED ORDER — ZOLEDRONIC ACID 4 MG/100ML IV SOLN
4.0000 mg | Freq: Once | INTRAVENOUS | Status: AC
  Administered 2016-12-24: 4 mg via INTRAVENOUS
  Filled 2016-12-24: qty 100

## 2016-12-24 MED ORDER — MORPHINE SULFATE ER 30 MG PO TBCR: 30.0000 mg | EXTENDED_RELEASE_TABLET | Freq: Two times a day (BID) | ORAL | 0 refills | Status: DC

## 2016-12-24 MED ORDER — DESVENLAFAXINE SUCCINATE ER 100 MG PO TB24: 100.0000 mg | ORAL_TABLET | Freq: Every day | ORAL | 3 refills | Status: DC

## 2016-12-24 MED ORDER — XELODA 500 MG PO TABS: ORAL_TABLET | ORAL | 1 refills | Status: DC

## 2016-12-24 MED ORDER — POTASSIUM CHLORIDE CRYS ER 20 MEQ PO TBCR: 20.0000 meq | EXTENDED_RELEASE_TABLET | Freq: Three times a day (TID) | ORAL | 3 refills | Status: DC

## 2016-12-24 MED ORDER — LORAZEPAM 0.5 MG PO TABS: 0.5000 mg | ORAL_TABLET | Freq: Four times a day (QID) | ORAL | 1 refills | Status: DC | PRN

## 2016-12-24 MED ORDER — LAPATINIB DITOSYLATE 250 MG PO TABS: 1250.0000 mg | ORAL_TABLET | Freq: Every day | ORAL | 1 refills | Status: DC

## 2016-12-24 MED ORDER — METHYLPHENIDATE HCL 20 MG PO TABS: 20.0000 mg | ORAL_TABLET | Freq: Two times a day (BID) | ORAL | 0 refills | Status: DC

## 2016-12-24 MED ORDER — HEPARIN SOD (PORK) LOCK FLUSH 100 UNIT/ML IV SOLN
INTRAVENOUS | Status: AC
  Filled 2016-12-24: qty 5

## 2016-12-24 MED ORDER — ONDANSETRON HCL 8 MG PO TABS: 8.0000 mg | ORAL_TABLET | Freq: Three times a day (TID) | ORAL | 4 refills | Status: DC | PRN

## 2016-12-24 MED ORDER — SODIUM CHLORIDE 0.9 % IV SOLN
INTRAVENOUS | Status: DC
  Administered 2016-12-24: 15:00:00 via INTRAVENOUS

## 2016-12-24 NOTE — Patient Instructions (Signed)
[facility] at [facility] Discharge Instructions  RECOMMENDATIONS MADE BY THE CONSULTANT AND ANY TEST RESULTS WILL BE SENT TO YOUR REFERRING PHYSICIAN.  You were seen today by Gretchen Dawson NP.   Thank you for choosing [facility] at [facility] to provide your oncology and hematology care.  To afford each patient quality time with our provider, please arrive at least 15 minutes before your scheduled appointment time.    If you have a lab appointment with the Cancer Center please come in thru the  Main Entrance and check in at the main information desk  You need to re-schedule your appointment should you arrive 10 or more minutes late.  We strive to give you quality time with our providers, and arriving late affects you and other patients whose appointments are after yours.  Also, if you no show three or more times for appointments you may be dismissed from the clinic at the providers discretion.     Again, thank you for choosing [facility].  Our hope is that these requests will decrease the amount of time that you wait before being seen by our physicians.       _____________________________________________________________  Should you have questions after your visit to [facility], please contact our office at [phone] between the hours of 8:30 a.m. and 4:30 p.m.  Voicemails left after 4:30 p.m. will not be returned until the following business day.  For prescription refill requests, have your pharmacy contact our office.       Resources For Cancer Patients and their Caregivers ? [employer]: Can assist with transportation, wigs, general needs, runs Look Good Feel Better.        1-888-227-6333 ? Cancer Care: Provides financial assistance, online support groups, medication/co-pay assistance.  1-800-813-HOPE (4673) ? [facility] Assists [place] cancer patients and  their families through emotional , educational and financial support.  336-427-4357 ? [place] DSS Where to apply for food stamps, Medicaid and utility assistance. 336-342-1394 ? [facility]: Transportation to medical appointments. 336-347-2287 ? Social Security Administration: May apply for disability if have a Stage IV cancer. 336-342-7796 1-800-772-1213 ? [place] [facility]: Assists with nutrition, care and transit needs. 336-349-2343  Cancer Center Support Programs: @[email or web address]@ > Cancer Support Group  2nd Tuesday of the month 1pm-2pm, Journey Room  > Creative Journey  3rd Tuesday of the month 1130am-1pm, Journey Room  > Look Good Feel Better  1st Wednesday of the month 10am-12 noon, Journey Room (Call [employer] to register 1-800-395-5775)    

## 2016-12-24 NOTE — Progress Notes (Addendum)
[name], [place] [address]   CLINIC:  Medical Oncology/Hematology  PCP:  [name], MD [place] [address] [phone]   REASON FOR VISIT:  Follow-up for Stage IV metastatic breast adenocarcinoma with liver, bone, brain, and spine mets; ER+/HER2+   CURRENT THERAPY: Xeloda 1500 mg AM and 1500 mg PM, 7 days on/7 days off schedule with Tykerb daily & Zometa monthly   BRIEF ONCOLOGIC HISTORY:    Breast cancer, stage 4 ([place])   04/25/2014 Initial Diagnosis    Breast cancer, stage 4      04/25/2014 Imaging    CT abdomen/pelvis with widespread metastatic disease to the liver, multiple lytic lesions throughout spine and pelvis. No FX or epidural tumor identified      04/26/2014 Imaging    CT head unremarkable      04/26/2014 Imaging    CT chest with no lung mass or pulmonary nodules, no adenopathy. Lytic bone lesions, right 2nd rib      04/27/2014 Initial Biopsy    U/S guided liver biopsy, lesion in anterior and inferior left hepatic lobe biopsied      04/27/2014 Pathology Results    Metastatic adenocarcinoma, CK7, ER+, patchy positivity with PR. Possible primary includes breast, less likely gynecologic      05/15/2014 Mammogram    BI-RADS CATEGORY  2: Benign Finding(s)      05/16/2014 PET scan    1. Intensely hypermetabolic hepatic metastasis. 2. Widespread hypermetabolic skeletal lesions. 3. No primary adenocarcinoma identified by FDG PET imaging.      05/19/2014 Imaging    MUGA- Left ventricular ejection fracture greater than 70%.      05/21/2014 Breast MRI    No suspicious masses or enhancement within the breasts. No axillary adenopathy.      05/22/2014 - 07/03/2014 Antibody Plan    Herceptin/Perjeta/Tamoxifen      06/12/2014 - 07/03/2014 Chemotherapy    Taxotere added secondary to persistent abdominal and back pain      06/17/2014 - 06/19/2014 Hospital Admission    Neutropenia, fever, diarrhea, nausea,  vomiting      06/20/2014 - 07/10/2014 Radiation Therapy    Dr. Thea Silversmith 12 fractions to L3-S3 (30 Gy) and left scapula (20 Gy).       07/03/2014 Adverse Reaction    Perjeta- induced diarrhea.  Perjeta discontinued      07/16/2014 - 07/20/2014 Hospital Admission    Electrolyte abnormalities, and diarrhea.  Suspect Perjeta-induced diarrhea.  Negative GI work-up.      07/24/2014 - 08/19/2015 Chemotherapy    Herceptin/Tamoxifen/Xgeva      08/21/2014 Imaging    MUGA- Left ventricular ejection fraction equals 71%.      08/24/2014 PET scan    Dramatic reduction in metabolic activity of the widespread liver metastasis. Liver metastasis now have metabolic activity equal to background normal liver activity. Liver has a nodular contour. Marked reduction in metabolic activity of skeletal lesions..      10/05/2014 Progression    Widespread metastatic disease to the brain as described. Between 20 and 30 intracranial metastatic deposits are now seen. No midline shift or incipient herniation      10/09/2014 - 10/26/2014 Radiation Therapy    Whole Brain XRT      11/14/2014 Imaging    MUGA- LVEF 67%      02/13/2015 Imaging    MUGA- LVEF 59%      02/15/2015 Treatment Plan Change    Due  to declining LVEF, will hold Herceptin per PI guidelines.      04/12/2015 -  Chemotherapy    Herceptin restarted      06/02/2015 - 06/08/2015 Hospital Admission    Pneumonia      07/05/2015 Progression     PET/CT concern for mild progression of skeletal metastasis with several lesions within the spine and 1 lesion in the Left iliac wing with mild to moderate metabolic activity new from prior. Rising CA 27-29      07/16/2015 - 10/23/2015 Anti-estrogen oral therapy    Arimidex      07/16/2015 Imaging    MRI brain with satisfactory post treatment apperance of brain. interval resolved enhancing R caudate metastasis, minimal punctate residual enhancing metastatic disease at the inferior L cerebellum. No new  metastatic disease or new intracranial abnormality      07/19/2015 Treatment Plan Change    Discontinue Tamoxifen, Zoladex plus Arimidex.       08/27/2015 Procedure    Laparoscopic bilateral salpingo-oophorectomy by Dr. Gaetano Net      10/17/2015 PET scan    Osseous metastatic disease appears slightly progressive based on a new right scapular lesion and increased uptake within lesions in the thoracic spine, left iliac wing and  proximal right femur.      10/17/2015 Progression    Slight progression on PET scan imaging.      10/18/2015 Imaging    REsolved enhancing metastatic disease to the brain status post WBXRT      10/23/2015 - 02/12/2016 Adjuvant Chemotherapy    Faslodex loading followed by maintenance dose.  (Herceptin continued)      11/04/2015 Imaging    MUGA- LEFT ventricular ejection fraction 51% slightly decreased in a 57% on the previous exam.      12/24/2015 Treatment Plan Change    Zometa every 28 days.  Xgeva discontinued.        01/01/2016 Imaging    MUGA- Left ventricular ejection fraction equals 57.9%. This is increased from 51.1% previously.      02/03/2016 PET scan    1. Mixed metabolic changes in the scattered hypermetabolic sclerotic osseous metastases throughout the axial and proximal appendicular skeleton as detailed above. 2. No new sites of hypermetabolic metastatic disease. Stable pseudo-cirrhotic appearance of the liver due to treated liver metastases with no hypermetabolic liver metastases.       02/03/2016 Progression    PET shows mixed osseous response.  Some lesions more hypermetabolic, others improved.      02/05/2016 Treatment Plan Change    D/C Herceptin.  Faslodex as scheduled on 02/12/2016, then discontinued.  Continue Zometa.      02/05/2016 Treatment Plan Change    Prescriptions for Xeloda 7 days on and 7 days off and Tykerb printed and provided for authorization.      02/19/2016 -  Chemotherapy    Xeloda 2300 mg BID 7 days on and 7 days  off and Tykerb       02/24/2016 Treatment Plan Change    Xeloda dose reduced by 10% to 2000 mg BID week on and week off.      03/18/2016 Treatment Plan Change    Xeloda dose reduced to 2000 mg in AM and 1500 mg in PM 7 days on and 7 days off.      03/23/2016 Imaging    MUGA- Normal LEFT ventricular ejection fraction of 56% not significantly changed from 58% on previous exam.      05/19/2016 Imaging    MRI brain- .  No new focus of abnormal enhancement to suggest interval metastatic disease. 2. No acute intracranial abnormality. 3. Stable foci of T2 FLAIR hyperintensity in white matter and in the right caudate head compatible with posttreatment changes and treated metastasis.      05/20/2016 PET scan    1. The multiple osseous metastatic lesions shown to be hypermetabolic on the prior exam are reduced in activity or even resolved in hypermetabolic activity compared to prior, as detailed above. There are also numerous sclerotic bony lesions wedge are not currently and were not previously hypermetabolic, compatible with old non active lesions. 2. No findings of extra osseous metastatic disease currently. 3. Hepatic pseudocirrhosis. 4. Healing rib fractures. Chronic pathologic fracture the left acromion.      07/08/2016 Imaging    MUGA- Low normal LEFT ventricular ejection fraction of 53%, little changed since the 56% on 03/23/2016 but slightly decreased from the 58% on 01/01/2016.      09/07/2016 Echocardiogram    MUGA- Normal LEFT ventricular ejection fraction of 59% with normal LV wall motion.      09/07/2016 Treatment Plan Change    Xeloda dose reduced to 1500 mg BID 7 days on and 7 days off.      09/15/2016 PET scan    No significant change in diffuse sclerotic bone metastases on CT images, although several show mildly increased FDG uptake since prior study.  No evidence of soft tissue metastatic disease.      11/02/2016 Imaging    MRI brain- No change from the  prior study. Previously noted metastatic deposits have been treated. No new lesions identified.        INTERVAL HISTORY:  Ms. Garinger 51 y.o. female presents for continued follow-up for metastatic breast cancer.   She is here today with her husband. Currently taking Xeloda 1500 mg in the AM and PM. She takes the Tykerb daily.  She is due for monthly Zometa today.  Remains compliant with chemotherapy.  Symptoms she has been experiencing are normal for her and no worse, including: fatigue, shortness of breath, feet swelling, nausea, rash/itching at times, dizziness, peripheral neuropathy to feet, and depression.  Appetite 50%; energy levels 0%.   Recently saw her eye doctor and was diagnosed with macular edema in (R) eye. She is monitoring symptoms closely and has follow-up with ophthalmology regularly.   Biggest concerns today are fatigue, emotional lability, and depressed mood. She has not seen much improvement in her mood with changing to Pristiq at last visit.  States that she has been crying more lately. She has been talking with Luz Brazen, chaplain, and Ms. Gurka feels like she is "grieving my old life."    Requesting refills on MS Contin, Ativan, Zofran, and Ritalin today.     She is looking forward to a family beach trip soon.     REVIEW OF SYSTEMS:  Review of Systems - Oncology, per HPI.      PAST MEDICAL/SURGICAL HISTORY:  Past Medical History:  Diagnosis Date  . Anticoagulated    xarelto  . Anxiety   . Breast cancer metastasized to multiple sites [facility])    liver, brain, and bone  . Breast cancer, stage 4 [facility]) oncologist-  dr Larene [place] ([facility])   dx 12/ 2015 -- breast cancer Stage 4,  ER/HER2 +,  w/  liver, brain and  bone mets/  chemotherapy and radiation therapy  . Chronic pain syndrome    secondary to cancer   . Depression   .  Drug-induced cardiomyopathy ([place])    per last MUGA (08-08-2015), ef 56.5/ per last echo 05-18-2014 ef 60-65%  .  Family history of prostate cancer   . GERD (gastroesophageal reflux disease)   . History of colon polyps    07-13-2013  benign  . History of DVT (deep vein thrombosis)    07-09-2014  upper right extremity-  RIJ and right subclavian--  resolved  . History of gastritis    erosive  . History of pneumonia    HCAP 06-07-2015--  resolved per cxr 07-04-2015  . History of radiation therapy    12 fractions to L3 - S3, 30Gy and left spacula 20Gy (06-20-2014 to 07-10-2014) //  whole brain rxt (10-09-2014 to 10-26-2014)  . History of small bowel obstruction    S/P RESECTION 2008  . Migraine   . PONV (postoperative nausea and vomiting)    pt states scope patch does well   Past Surgical History:  Procedure Laterality Date  . BREAST REDUCTION SURGERY  03/17/2011   Procedure: MAMMARY REDUCTION BILATERAL (BREAST);  Surgeon: Mary A Contogiannis;  Location: [place];  Service: Plastics;  Laterality: Bilateral;  . CATARACT EXTRACTION W/ INTRAOCULAR LENS  IMPLANT, BILATERAL  2008  . CERVICAL FUSION  2003   C5 -- C6  . COLONOSCOPY N/A 07/13/2013   Procedure: COLONOSCOPY;  Surgeon: Rogene Houston, MD;  [facility];  Service: Endoscopy;  Laterality: N/A;  930  . COLONOSCOPY N/A 11/26/2014   Procedure: COLONOSCOPY;  Surgeon: Rogene Houston, MD;  [facility];  Service: Endoscopy;  Laterality: N/A;  730  . D & C HYSTEROSCOPY/  RESECTION ENDOMETRIAL MASS/  [place] ENDOMETRIAL ABLATION  04-11-2010  . DX LAPAROSCOPY W/ PARTIAL SMALL BOWEL RESECTION AND APPENDECTOMY  04-13-2007  . ESOPHAGOGASTRODUODENOSCOPY N/A 05/25/2014   Procedure: ESOPHAGOGASTRODUODENOSCOPY (EGD);  Surgeon: Rogene Houston, MD;  [facility];  Service: Endoscopy;  Laterality: N/A;  155  . KNEE ARTHROSCOPY Right 2005  . LAPAROSCOPIC ASSISTED VAGINAL HYSTERECTOMY  10-13-2010   w/ Bx Left Fallopian tube and Aspiration Right Ovarian Cyst  . LAPAROSCOPIC CHOLECYSTECTOMY  11-17-2002  .  LAPAROSCOPIC SALPINGO OOPHERECTOMY Bilateral 08/26/2015   Procedure: LAPAROSCOPIC SALPINGO OOPHORECTOMY, bilateral;  Surgeon: Everlene Farrier, MD;  Location: [place];  Service: Gynecology;  Laterality: Bilateral;  . PORTACATH PLACEMENT  05-17-2014  . REDUCTION MAMMAPLASTY    . SHOULDER ARTHROSCOPY WITH ROTATOR CUFF REPAIR Right 2002  . TRANSTHORACIC ECHOCARDIOGRAM  05-18-2014   ef 60-65%//   last MUGA  (08-08-2015)  ef 56.6%       SOCIAL HISTORY:  Social History   Social History  . Marital status: Married    Spouse name: N/A  . Number of children: N/A  . Years of education: N/A   Occupational History  . Not on file.   Social History Main Topics  . Smoking status: Former Smoker    Types: Cigarettes    Quit date: 08/20/1994  . Smokeless tobacco: Never Used  . Alcohol use Yes     Comment: Occasionally  . Drug use: No  . Sexual activity: Not on file   Other Topics Concern  . Not on file   Social History Narrative  . No narrative on file    FAMILY HISTORY:  Family History  Problem Relation Age of Onset  . Diabetes Father   . Heart attack Maternal Grandmother 30       multiple over lifetime.  . Cancer Maternal Grandmother 34  NOS  . Prostate cancer Maternal Grandfather        dx in his 29s  . Lung cancer Paternal Grandfather        dx <50  . Lymphoma Maternal Aunt        dx in her 63s  . Melanoma Cousin 47       maternal first cousin  . Brain cancer Cousin        paternal first cousin dx under 72  . Prostate cancer Other        MGF's father  . Colon cancer Other        MGM's mother    CURRENT MEDICATIONS:  Outpatient Encounter Prescriptions as of 12/24/2016  Medication Sig  . acetaminophen (TYLENOL) 325 MG tablet Take 650 mg by mouth every 6 (six) hours as needed for moderate pain or headache.  . calcium carbonate (TUMS - DOSED IN MG ELEMENTAL CALCIUM) 500 MG chewable tablet Chew 1 tablet by mouth daily.  . Calcium-Phosphorus-Vitamin D  (CALCIUM/D3 ADULT GUMMIES PO) Take 2 tablets by mouth 2 (two) times daily. dosage of calcium 1251m and vitamin d 10068m . diphenhydrAMINE (BENADRYL) 25 mg capsule Take 25 mg by mouth every 6 (six) hours as needed for itching.  . furosemide (LASIX) 20 MG tablet TAKE 1 TABLET BY MOUTH TWICE DAILY (Patient taking differently: taking once daily)  . gabapentin (NEURONTIN) 300 MG capsule Take 2 capsules (600 mg) at bedtime.  . lapatinib (TYKERB) 250 MG tablet Take 5 tablets (1,250 mg total) by mouth daily.  . Marland Kitchenidocaine-prilocaine (EMLA) cream Apply 1 application topically daily as needed (apply to port before chemo).  . LORazepam (ATIVAN) 0.5 MG tablet Take 1 tablet (0.5 mg total) by mouth every 6 (six) hours as needed for anxiety.  . magic mouthwash SOLN Swish and swallow 37m69mour times a day.  . Melatonin 10 MG CAPS Take 1 tablet by mouth. Pt states she takes one tablet at night for sleep  . methylphenidate (RITALIN) 20 MG tablet Take 1 tablet (20 mg total) by mouth 2 (two) times daily. Take 1 tab in morning and 1 tab at lunch/before 1pm. (Patient taking differently: Take 20 mg by mouth daily. Take 1 tab in morning and 1 tab at lunch/before 1pm.)  . morphine (MS CONTIN) 30 MG 12 hr tablet Take 1 tablet (30 mg total) by mouth every 12 (twelve) hours.  . ondansetron (ZOFRAN) 8 MG tablet Take 1 tablet (8 mg total) by mouth every 8 (eight) hours as needed for nausea or vomiting.  . oMarland KitchenyCODONE (OXY IR/ROXICODONE) 5 MG immediate release tablet Take 1-2 tablets (5-10 mg total) by mouth every 4 (four) hours as needed for moderate pain.  . pantoprazole (PROTONIX) 40 MG tablet TAKE 1 TABLET BY MOUTH EVERY DAY  . potassium chloride SA (K-DUR,KLOR-CON) 20 MEQ tablet Take 1 tablet (20 mEq total) by mouth 3 (three) times daily.  . promethazine (PHENERGAN) 25 MG tablet TAKE 1 TABLET BY MOUTH EVERY SIX HOURS AS NEEDED FOR NAUSEA OR VOMITING  . silver sulfADIAZINE (SILVADENE) 1 % cream Apply 1 application topically  daily.  . XAlveda Reasons MG TABS tablet TAKE 1 TABLET BY MOUTH EVERY DAY WITH SUPPER  . XELODA 500 MG tablet Take 1500 mg PO BID, 7 days on and 7 days off  . Zoledronic Acid (ZOMETA) 4 MG/100ML IVPB Inject 100 mLs (4 mg total) into the vein every 30 (thirty) days.  . zMarland Kitchenlpidem (AMBIEN) 10 MG tablet TAKE 1 TABLET BY MOUTH AT  BEDTIME AS NEEDED  . [DISCONTINUED] desvenlafaxine (PRISTIQ) 50 MG 24 hr tablet Take 1 tablet (50 mg total) by mouth daily.  . [DISCONTINUED] potassium chloride SA (K-DUR,KLOR-CON) 20 MEQ tablet TAKE 1 TABLET BY MOUTH TWICE DAILY  . [DISCONTINUED] venlafaxine XR (EFFEXOR-XR) 75 MG 24 hr capsule TAKE TWO CAPSULES BY MOUTH EVERY DAY with breakfast  . desvenlafaxine (PRISTIQ) 100 MG 24 hr tablet Take 1 tablet (100 mg total) by mouth daily.  . [DISCONTINUED] lapatinib (TYKERB) 250 MG tablet Take 5 tablets (1,250 mg total) by mouth daily.  . [DISCONTINUED] LORazepam (ATIVAN) 0.5 MG tablet Take 1 tablet (0.5 mg total) by mouth every 6 (six) hours as needed for anxiety. Reported on 08/16/2015  . [DISCONTINUED] methylphenidate (RITALIN) 20 MG tablet Take 1 tablet (20 mg total) by mouth 2 (two) times daily. Take 1 tab in morning and 1 tab at lunch/before 1pm.  . [DISCONTINUED] morphine (MS CONTIN) 30 MG 12 hr tablet Take 1 tablet (30 mg total) by mouth every 12 (twelve) hours.  . [DISCONTINUED] ondansetron (ZOFRAN) 8 MG tablet Take 1 tablet (8 mg total) by mouth every 8 (eight) hours as needed for nausea or vomiting.  . [DISCONTINUED] XELODA 500 MG tablet Take 1500 mg PO BID, 7 days on and 7 days off   No facility-administered encounter medications on file as of 12/24/2016.     ALLERGIES:  No Known Allergies   PHYSICAL EXAM:  ECOG Performance status: 1 - Symptomatic, but independent.      Physical Exam  Constitutional: She is oriented to person, place, and time and well-developed, well-nourished, and in no distress.  Seen in chemo chair in infusion area   HENT:  Head:  Normocephalic.  Mouth/Throat: Oropharynx is clear and moist.  Eyes: Conjunctivae are normal. No scleral icterus.  Neck: Normal range of motion. Neck supple.  Cardiovascular: Normal rate and regular rhythm.   Pulmonary/Chest: Effort normal and breath sounds normal. No respiratory distress.  Abdominal: Soft. Bowel sounds are normal. There is no tenderness.  Musculoskeletal: Normal range of motion. She exhibits no edema.  Lymphadenopathy:    She has no cervical adenopathy.       Right: No supraclavicular adenopathy present.       Left: No supraclavicular adenopathy present.  Neurological: She is alert and oriented to person, place, and time. No cranial nerve deficit.  Skin: Skin is warm and dry. No rash noted.  Psychiatric: Mood, memory, affect and judgment normal.  Nursing note and vitals reviewed.    LABORATORY DATA:  I have reviewed the labs as listed.  CBC    Component Value Date/Time   WBC 6.9 12/24/2016 1331   RBC 4.02 12/24/2016 1331   HGB 11.9 (L) 12/24/2016 1331   HCT 36.3 12/24/2016 1331   PLT 248 12/24/2016 1331   MCV 90.3 12/24/2016 1331   MCH 29.6 12/24/2016 1331   MCHC 32.8 12/24/2016 1331   RDW 18.2 (H) 12/24/2016 1331   LYMPHSABS 1.2 12/24/2016 1331   MONOABS 0.5 12/24/2016 1331   EOSABS 0.0 12/24/2016 1331   BASOSABS 0.0 12/24/2016 1331   CMP Latest Ref Rng & Units 12/24/2016 11/26/2016 10/29/2016  Glucose 65 - 99 mg/dL 137(H) 122(H) 122(H)  BUN 6 - 20 mg/dL _0 Creatinine 0.44 - 1.00 mg/dL 0.89 0.92 0.77  Sodium 135 - 145 mmol/L 138 135 137  Potassium 3.5 - 5.1 mmol/L 3.1(L) 2.9(L) 3.4(L)  Chloride 101 - 111 mmol/L 99(L) 96(L) 97(L)  CO2 22 - 32 mmol/L 29  29 30  Calcium 8.9 - 10.3 mg/dL 9.2 9.2 9.3  Total Protein 6.5 - 8.1 g/dL 7.0 7.4 7.2  Total Bilirubin 0.3 - 1.2 mg/dL 0.6 0.6 0.3  Alkaline Phos 38 - 126 U/L 102 99 93  AST 15 - 41 U/L _0 ALT 14 - 54 U/L _1 Results for NARCISA, GANESH (MRN [id number])   Ref. Range 10/29/2016  11:00 11/26/2016 14:15  CA 15-3 Latest Ref Range: 0.0 - 25.0 U/mL 50.3 (H) 53.2 (H)  CA 27.29 Latest Ref Range: 0.0 - 38.6 U/mL 59.6 (H)   CA 27.29 Latest Ref Range: 0.0 - 38.6 U/mL  65.5 (H)     PENDING LABS:     DIAGNOSTIC IMAGING:  Most recent PET scan: 09/15/16       Most recent MUGA scan: 09/07/16    MRI brain: 11/02/16 CLINICAL DATA:  Metastatic breast cancer. Whole-brain radiation and chemotherapy  EXAM: MRI HEAD WITHOUT AND WITH CONTRAST  TECHNIQUE: Multiplanar, multiecho pulse sequences of the brain and surrounding structures were obtained without and with intravenous contrast.  CONTRAST:  42m MULTIHANCE GADOBENATE DIMEGLUMINE 529 MG/ML IV SOLN  COMPARISON:  MRI 05/19/2016  FINDINGS: Brain: Negative for enhancing metastatic deposits in the brain. Stable 3 mm T2 hyperintensity in the head of the caudate on the right is unchanged compatible with treated tumor. Other metastatic deposit is no longer enhance. No new lesions identified.  Ventricle size normal. Negative for acute infarct. Mild chronic white matter changes stable. Negative for hemorrhage or mass.  Vascular: Normal arterial flow void  Skull and upper cervical spine: Negative  Sinuses/Orbits: Negative  Other: None  IMPRESSION: No change from the prior study. Previously noted metastatic deposits have been treated. No new lesions identified.   Electronically Signed   By: CFranchot GalloM.D.   On: 11/02/2016 17:02     PATHOLOGY:  [facility]: 05/25/14         ASSESSMENT & PLAN:   Stage IV metastatic breast adenocarcinoma with liver, bone, brain, and spine mets; ER+/HER2+: -Currently taking Xeloda 1500 mg BID 7 days on and 7 days off with Tykerb daily.  -PET scan results from 09/15/16 reviewed; no significant change noted to diffuse sclerotic bone mets, although several show mildly increased FDG uptake. No evidence of soft tissue metastatic disease.    -Tumor  markers continue to rise. She is overdue for repeat PET scan, but has had insurance issues in recent months. She plans to have PET scan scheduled in September as her insurance should be corrected by 9/1/8.  MRI brain in 10/2016 was negative for recurrent brain mets.  -Continue current treatment with Xeloda and Tykerb. Refills for both chemo drugs printed and given to patient advocate, ADurene Cal to send to specialty pharmacy. -Return to cancer center in 1 month for continued follow-up.  Hypokalemia:  -Likely secondary to Lasix, which she is taking once daily.  Noted to have significant hypokalemia at 2.9 at last month's visit; she took oral K-Dur TID for about 2 weeks and returned to taking BID as directed.  -K 3.1 today. Recommended she increase K-Dur to TID for the foreseeable future. May need to switch her to Aldactone if hypokalemia persists.   Cardiotoxic chemotherapy (Tykerb):  -Last MUGA with EF 59% on 09/07/16.  MUGA scan overdue, secondary to insurance issues (as stated above). She plans to get scan scheduled in 01/2017 as she anticipates insurance concerns to be resolved by then.   Bone mets:  -  Continue monthly Zometa. Due for next dose today.  -Serum calcium reviewed and adequate for administration of Zometa today.   Depression & anxiety:  -Symptoms not much improved with switch from Effexor XR to Pristiq 50 mg. Ativan used prn for anxiety.  -Will increase Pristiq to 100 mg daily.  She has tried Abilify, alprazolam, clonazepam, & Zoloft in the past with little improvement in her symptoms.  Discussed referral to psychiatry to optimize her medication and initiate counseling. Clinically she benefited from counseling in the past.   States that she feels like she is "grieving how my life used to be."She has seen our chaplain, Luz Brazen periodically for support.  I provided emotional support today through validation and normalization of her concerns.  -She agreed to referral to  psychiatry; orders placed today. She has seen Dr. Clovis Pu in the past, but prefers to see a different provider. -Refill for Ativan given today.   Cancer-related fatigue:  -Improved with increase in Ritalin 20 mg. She generally does not take the medication BID, but it is helpful with first dose in the mornings.   Refill prescription given to patient today; will continue at current dose. In the future, I would be reluctant to further increase the dose given tachycardia and possibility of cardiotoxicity with Tykerb.  Chronic pain secondary to neoplasm:  -MS Contin effective in managing her pain.  MS Contin refilled and paper prescription given to patient.  -Continue oxycodone for breakthrough pain PRN.  -Bowels are moving well; she understands the importance of adequate bowel regimen with opiate use.         Dispo:  -PET scan and MUGA overdue; patient to try to get done in 01/2017 when insurance issues are resolved.  -Refer to psychiatry for anxiety and depression.  -Return to cancer center in 1 month for next Zometa infusion and follow-up visit.    All questions were answered to patient's stated satisfaction. Encouraged patient to call with any new concerns or questions before her next visit to the cancer center and we can certain see her sooner, if needed.    Plan of care discussed with Dr. Talbert Cage, who agrees with the above aforementioned.    Orders placed this encounter:  No orders of the defined types were placed in this encounter.     Mike Craze, NP [place] [phone]

## 2016-12-24 NOTE — Progress Notes (Signed)
Zometa given per protocol. Patient is tolerating it well. Vitals stable and discharged home from clinic ambulatory. Follow up as scheduled.

## 2016-12-25 ENCOUNTER — Encounter (HOSPITAL_COMMUNITY): Payer: Self-pay | Admitting: Lab

## 2016-12-25 LAB — CANCER ANTIGEN 27.29: CAN 27.29: 66.2 U/mL — AB (ref 0.0–38.6)

## 2016-12-25 LAB — CANCER ANTIGEN 15-3: CAN 15 3: 58.6 U/mL — AB (ref 0.0–25.0)

## 2016-12-25 NOTE — Progress Notes (Unsigned)
Referral to [place] [facility] /  [facility] / [phone]/  appt Oct 10 @1 

## 2016-12-29 ENCOUNTER — Other Ambulatory Visit (HOSPITAL_COMMUNITY): Payer: Self-pay | Admitting: Oncology

## 2017-01-18 ENCOUNTER — Encounter (HOSPITAL_COMMUNITY)
Admission: RE | Admit: 2017-01-18 | Discharge: 2017-01-18 | Disposition: A | Discharge status: Home / Self Care | Payer: Medicare Other | Source: Ambulatory Visit | Attending: Adult Health | Admitting: Adult Health

## 2017-01-18 ENCOUNTER — Encounter (HOSPITAL_COMMUNITY): Payer: Self-pay

## 2017-01-18 ENCOUNTER — Encounter: Payer: Self-pay | Admitting: Podiatry

## 2017-01-18 ENCOUNTER — Ambulatory Visit: Payer: Medicare Other

## 2017-01-18 ENCOUNTER — Ambulatory Visit (INDEPENDENT_AMBULATORY_CARE_PROVIDER_SITE_OTHER): Payer: Medicare Other | Admitting: Podiatry

## 2017-01-18 DIAGNOSIS — C50919 Malignant neoplasm of unspecified site of unspecified female breast: Secondary | ICD-10-CM | POA: Diagnosis not present

## 2017-01-18 DIAGNOSIS — L600 Ingrowing nail: Secondary | ICD-10-CM | POA: Diagnosis not present

## 2017-01-18 DIAGNOSIS — C229 Malignant neoplasm of liver, not specified as primary or secondary: Secondary | ICD-10-CM | POA: Diagnosis not present

## 2017-01-18 DIAGNOSIS — R234 Changes in skin texture: Secondary | ICD-10-CM | POA: Diagnosis not present

## 2017-01-18 DIAGNOSIS — C7951 Secondary malignant neoplasm of bone: Secondary | ICD-10-CM | POA: Diagnosis not present

## 2017-01-18 DIAGNOSIS — Z0189 Encounter for other specified special examinations: Secondary | ICD-10-CM | POA: Diagnosis not present

## 2017-01-18 MED ORDER — TECHNETIUM TC 99M-LABELED RED BLOOD CELLS IV KIT
20.0000 | PACK | Freq: Once | INTRAVENOUS | Status: AC | PRN
  Administered 2017-01-18: 22 via INTRAVENOUS

## 2017-01-18 MED ORDER — GENTAMICIN SULFATE 0.1 % EX CREA: 1.0000 "application " | TOPICAL_CREAM | Freq: Three times a day (TID) | CUTANEOUS | 1 refills | Status: DC

## 2017-01-18 MED ORDER — HEPARIN SOD (PORK) LOCK FLUSH 100 UNIT/ML IV SOLN
INTRAVENOUS | Status: AC
  Filled 2017-01-18: qty 5

## 2017-01-18 NOTE — Patient Instructions (Signed)

## 2017-01-19 ENCOUNTER — Encounter (HOSPITAL_COMMUNITY): Payer: Self-pay | Admitting: Adult Health

## 2017-01-19 NOTE — Progress Notes (Signed)
Peer-to-peer completed for PET scan for [name] with Dr. Adelene Amas.    Approval #: [id number].  Approval good for 45 days.    Mike Craze, NP [place] [phone]

## 2017-01-19 NOTE — Progress Notes (Signed)
Subjective: Patient presents today for a new complaint of evaluation of pain in toe(s). Patient is concerned for possible ingrown nail. Patient states that the pain has been present for a few weeks now. Patient presents today for further treatment and evaluation. Patient also complains of a heel fissure or pain to the plantar aspect of the right heel.  Past Medical History:  Diagnosis Date  . Anticoagulated    xarelto  . Anxiety   . Breast cancer metastasized to multiple sites [facility])    liver, brain, and bone  . Breast cancer, stage 4 [facility]) oncologist-  dr Larene [place] [facility]   dx 12/ 2015 -- breast cancer Stage 4,  ER/HER2 +,  w/  liver, brain and  bone mets/  chemotherapy and radiation therapy  . Chronic pain syndrome    secondary to cancer   . Depression   . Drug-induced cardiomyopathy ([place])    per last MUGA (08-08-2015), ef 56.5/ per last echo 05-18-2014 ef 60-65%  . Family history of prostate cancer   . GERD (gastroesophageal reflux disease)   . History of colon polyps    07-13-2013  benign  . History of DVT (deep vein thrombosis)    07-09-2014  upper right extremity-  RIJ and right subclavian--  resolved  . History of gastritis    erosive  . History of pneumonia    HCAP 06-07-2015--  resolved per cxr 07-04-2015  . History of radiation therapy    12 fractions to L3 - S3, 30Gy and left spacula 20Gy (06-20-2014 to 07-10-2014) //  whole brain rxt (10-09-2014 to 10-26-2014)  . History of small bowel obstruction    S/P RESECTION 2008  . Migraine   . PONV (postoperative nausea and vomiting)    pt states scope patch does well    Objective:  General: Well developed, nourished, in no acute distress, alert and oriented x3   Dermatology: Skin is warm, dry and supple bilateral. Medial and lateral border of the left great toe as well as the lateral border of the right great toe appears to be erythematous with evidence of an ingrowing nail. Pain on palpation noted to  the border of the nail fold. The remaining nails appear unremarkable at this time. There are no open sores, lesions. There does appear to be a very superficial fissure to the right plantar heel. Limited to the dermis. Negative for any drainage.  Vascular: Dorsalis Pedis artery and Posterior Tibial artery pedal pulses palpable. No lower extremity edema noted.   Neruologic: Grossly intact via light touch bilateral.  Musculoskeletal: Muscular strength within normal limits in all groups bilateral. Normal range of motion noted to all pedal and ankle joints.   Assesement: #1 Paronychia with ingrowing nail medial and lateral border left great toe. Lateral border right great toe. #2 superficial heel fissure right plantar heel #3 Incurvated nail  Plan of Care:  1. Patient evaluated.  2. Discussed treatment alternatives and plan of care. Explained nail avulsion procedure and post procedure course to patient. 3. Patient opted for permanent partial nail avulsion.  4. Prior to procedure, local anesthesia infiltration utilized using 3 ml of a 50:50 mixture of 2% plain lidocaine and 0.5% plain marcaine in a normal hallux block fashion and a betadine prep performed.  5. Partial permanent nail avulsion with chemical matrixectomy performed using [id number] applications of phenol followed by alcohol flush.  6. Light dressing applied. 7. Recommend over-the-counter AmLactin cream for right heel fissure  8. Return to  clinic in 2 weeks.   Edrick Kins, DPM [facility]  Dr. Edrick Kins, [address]                                        [place], [place] [address]                Office [phone]  Fax [phone]

## 2017-01-20 ENCOUNTER — Ambulatory Visit (HOSPITAL_COMMUNITY)
Admission: RE | Admit: 2017-01-20 | Discharge: 2017-01-20 | Disposition: A | Discharge status: Home / Self Care | Payer: Medicare Other | Source: Ambulatory Visit | Attending: Oncology | Admitting: Oncology

## 2017-01-20 ENCOUNTER — Encounter (HOSPITAL_COMMUNITY): Payer: Self-pay | Admitting: Adult Health

## 2017-01-20 DIAGNOSIS — C50919 Malignant neoplasm of unspecified site of unspecified female breast: Secondary | ICD-10-CM | POA: Diagnosis not present

## 2017-01-20 LAB — GLUCOSE, CAPILLARY: GLUCOSE-CAPILLARY: 114 mg/dL — AB (ref 65–99)

## 2017-01-20 MED ORDER — FLUDEOXYGLUCOSE F - 18 (FDG) INJECTION
12.4000 | Freq: Once | INTRAVENOUS | Status: AC | PRN
  Administered 2017-01-20: 12.4 via INTRAVENOUS

## 2017-01-21 ENCOUNTER — Telehealth (HOSPITAL_COMMUNITY): Payer: Self-pay | Admitting: Adult Health

## 2017-01-21 NOTE — Telephone Encounter (Signed)
PET scan results reviewed with patient via phone.  Disease appears stable. No big change in bone met lesions. Per radiologist, there are some areas that are improved and others that have gotten a little bigger. Patient voiced understanding and appreciation for call.    Mike Craze, NP [place] [phone]

## 2017-02-01 ENCOUNTER — Encounter: Payer: Self-pay | Admitting: Podiatry

## 2017-02-01 ENCOUNTER — Ambulatory Visit (INDEPENDENT_AMBULATORY_CARE_PROVIDER_SITE_OTHER): Payer: Medicare Other | Admitting: Podiatry

## 2017-02-01 DIAGNOSIS — R234 Changes in skin texture: Secondary | ICD-10-CM | POA: Diagnosis not present

## 2017-02-01 DIAGNOSIS — L600 Ingrowing nail: Secondary | ICD-10-CM | POA: Diagnosis not present

## 2017-02-01 NOTE — Progress Notes (Signed)
   Subjective: Patient presents today 2 weeks post ingrown nail permanent nail avulsion procedure. Patient states that the toe and nail fold is feeling much better.  Past Medical History:  Diagnosis Date  . Anticoagulated    xarelto  . Anxiety   . Breast cancer metastasized to multiple sites [facility])    liver, brain, and bone  . Breast cancer, stage 4 [facility]) oncologist-  dr [place]rene [place] ([facility])   dx 12/ 2015 -- breast cancer Stage 4,  ER/HER2 +,  w/  liver, brain and  bone mets/  chemotherapy and radiation therapy  . Chronic pain syndrome    secondary to cancer   . Depression   . Drug-induced cardiomyopathy ([place])    per last MUGA (08-08-2015), ef 56.5/ per last echo 05-18-2014 ef 60-65%  . Family history of prostate cancer   . GERD (gastroesophageal reflux disease)   . History of colon polyps    07-13-2013  benign  . History of DVT (deep vein thrombosis)    07-09-2014  upper right extremity-  RIJ and right subclavian--  resolved  . History of gastritis    erosive  . History of pneumonia    HCAP 06-07-2015--  resolved per cxr 07-04-2015  . History of radiation therapy    12 fractions to L3 - S3, 30Gy and left spacula 20Gy (06-20-2014 to 07-10-2014) //  whole brain rxt (10-09-2014 to 10-26-2014)  . History of small bowel obstruction    S/P RESECTION 2008  . Migraine   . PONV (postoperative nausea and vomiting)    pt states scope patch does well    Objective: Skin is warm, dry and supple. Nail and respective nail fold appears to be healing appropriately. Open wound to the associated nail fold with a granular wound base and moderate amount of fibrotic tissue. Minimal drainage noted. Mild erythema around the periungual region likely due to phenol chemical matricectomy.  Assessment: #1 postop permanent partial nail avulsion Medial and lateral borders of the left great toe. Lateral border of the right great toe. #2 open wound periungual nail fold of respective digit.    Plan of care: #1 patient was evaluated  #2 debridement of open wound was performed to the periungual border of the respective toe using a currette. Antibiotic ointment and Band-Aid was applied. #3 patient is to return to clinic on a PRN  basis.   Edrick Kins, DPM [facility]  Dr. Edrick Kins, [address]                                        [place], [place] [address]                Office [phone]  Fax [phone]

## 2017-02-01 NOTE — Progress Notes (Signed)
[name], [place] [address]   CLINIC:  Medical Oncology/Hematology  PCP:  [name], MD [place] [address] [phone]   REASON FOR VISIT:  Follow-up for Stage IV metastatic breast adenocarcinoma with liver, bone, brain, and spine mets; ER+/HER2+   CURRENT THERAPY: Xeloda 1500 mg AM and 1500 mg PM, 7 days on/7 days off schedule with Tykerb daily & Zometa monthly   BRIEF ONCOLOGIC HISTORY:    Breast cancer, stage 4 ([facility])   04/25/2014 Initial Diagnosis    Breast cancer, stage 4      04/25/2014 Imaging    CT abdomen/pelvis with widespread metastatic disease to the liver, multiple lytic lesions throughout spine and pelvis. No FX or epidural tumor identified      04/26/2014 Imaging    CT head unremarkable      04/26/2014 Imaging    CT chest with no lung mass or pulmonary nodules, no adenopathy. Lytic bone lesions, right 2nd rib      04/27/2014 Initial Biopsy    U/S guided liver biopsy, lesion in anterior and inferior left hepatic lobe biopsied      04/27/2014 Pathology Results    Metastatic adenocarcinoma, CK7, ER+, patchy positivity with PR. Possible primary includes breast, less likely gynecologic      05/15/2014 Mammogram    BI-RADS CATEGORY  2: Benign Finding(s)      05/16/2014 PET scan    1. Intensely hypermetabolic hepatic metastasis. 2. Widespread hypermetabolic skeletal lesions. 3. No primary adenocarcinoma identified by FDG PET imaging.      05/19/2014 Imaging    MUGA- Left ventricular ejection fracture greater than 70%.      05/21/2014 Breast MRI    No suspicious masses or enhancement within the breasts. No axillary adenopathy.      05/22/2014 - 07/03/2014 Antibody Plan    Herceptin/Perjeta/Tamoxifen      06/12/2014 - 07/03/2014 Chemotherapy    Taxotere added secondary to persistent abdominal and back pain      06/17/2014 - 06/19/2014 Hospital Admission    Neutropenia, fever, diarrhea, nausea,  vomiting      06/20/2014 - 07/10/2014 Radiation Therapy    Dr. Thea Silversmith 12 fractions to L3-S3 (30 Gy) and left scapula (20 Gy).       07/03/2014 Adverse Reaction    Perjeta- induced diarrhea.  Perjeta discontinued      07/16/2014 - 07/20/2014 Hospital Admission    Electrolyte abnormalities, and diarrhea.  Suspect Perjeta-induced diarrhea.  Negative GI work-up.      07/24/2014 - 08/19/2015 Chemotherapy    Herceptin/Tamoxifen/Xgeva      08/21/2014 Imaging    MUGA- Left ventricular ejection fraction equals 71%.      08/24/2014 PET scan    Dramatic reduction in metabolic activity of the widespread liver metastasis. Liver metastasis now have metabolic activity equal to background normal liver activity. Liver has a nodular contour. Marked reduction in metabolic activity of skeletal lesions..      10/05/2014 Progression    Widespread metastatic disease to the brain as described. Between 20 and 30 intracranial metastatic deposits are now seen. No midline shift or incipient herniation      10/09/2014 - 10/26/2014 Radiation Therapy    Whole Brain XRT      11/14/2014 Imaging    MUGA- LVEF 67%      02/13/2015 Imaging    MUGA- LVEF 59%      02/15/2015 Treatment Plan Change    Due  to declining LVEF, will hold Herceptin per PI guidelines.      04/12/2015 -  Chemotherapy    Herceptin restarted      06/02/2015 - 06/08/2015 Hospital Admission    Pneumonia      07/05/2015 Progression     PET/CT concern for mild progression of skeletal metastasis with several lesions within the spine and 1 lesion in the Left iliac wing with mild to moderate metabolic activity new from prior. Rising CA 27-29      07/16/2015 - 10/23/2015 Anti-estrogen oral therapy    Arimidex      07/16/2015 Imaging    MRI brain with satisfactory post treatment apperance of brain. interval resolved enhancing R caudate metastasis, minimal punctate residual enhancing metastatic disease at the inferior L cerebellum. No new  metastatic disease or new intracranial abnormality      07/19/2015 Treatment Plan Change    Discontinue Tamoxifen, Zoladex plus Arimidex.       08/27/2015 Procedure    Laparoscopic bilateral salpingo-oophorectomy by Dr. Gaetano Net      10/17/2015 PET scan    Osseous metastatic disease appears slightly progressive based on a new right scapular lesion and increased uptake within lesions in the thoracic spine, left iliac wing and  proximal right femur.      10/17/2015 Progression    Slight progression on PET scan imaging.      10/18/2015 Imaging    REsolved enhancing metastatic disease to the brain status post WBXRT      10/23/2015 - 02/12/2016 Adjuvant Chemotherapy    Faslodex loading followed by maintenance dose.  (Herceptin continued)      11/04/2015 Imaging    MUGA- LEFT ventricular ejection fraction 51% slightly decreased in a 57% on the previous exam.      12/24/2015 Treatment Plan Change    Zometa every 28 days.  Xgeva discontinued.        01/01/2016 Imaging    MUGA- Left ventricular ejection fraction equals 57.9%. This is increased from 51.1% previously.      02/03/2016 PET scan    1. Mixed metabolic changes in the scattered hypermetabolic sclerotic osseous metastases throughout the axial and proximal appendicular skeleton as detailed above. 2. No new sites of hypermetabolic metastatic disease. Stable pseudo-cirrhotic appearance of the liver due to treated liver metastases with no hypermetabolic liver metastases.       02/03/2016 Progression    PET shows mixed osseous response.  Some lesions more hypermetabolic, others improved.      02/05/2016 Treatment Plan Change    D/C Herceptin.  Faslodex as scheduled on 02/12/2016, then discontinued.  Continue Zometa.      02/05/2016 Treatment Plan Change    Prescriptions for Xeloda 7 days on and 7 days off and Tykerb printed and provided for authorization.      02/19/2016 -  Chemotherapy    Xeloda 2300 mg BID 7 days on and 7 days  off and Tykerb       02/24/2016 Treatment Plan Change    Xeloda dose reduced by 10% to 2000 mg BID week on and week off.      03/18/2016 Treatment Plan Change    Xeloda dose reduced to 2000 mg in AM and 1500 mg in PM 7 days on and 7 days off.      03/23/2016 Imaging    MUGA- Normal LEFT ventricular ejection fraction of 56% not significantly changed from 58% on previous exam.      05/19/2016 Imaging    MRI brain- .  No new focus of abnormal enhancement to suggest interval metastatic disease. 2. No acute intracranial abnormality. 3. Stable foci of T2 FLAIR hyperintensity in white matter and in the right caudate head compatible with posttreatment changes and treated metastasis.      05/20/2016 PET scan    1. The multiple osseous metastatic lesions shown to be hypermetabolic on the prior exam are reduced in activity or even resolved in hypermetabolic activity compared to prior, as detailed above. There are also numerous sclerotic bony lesions wedge are not currently and were not previously hypermetabolic, compatible with old non active lesions. 2. No findings of extra osseous metastatic disease currently. 3. Hepatic pseudocirrhosis. 4. Healing rib fractures. Chronic pathologic fracture the left acromion.      07/08/2016 Imaging    MUGA- Low normal LEFT ventricular ejection fraction of 53%, little changed since the 56% on 03/23/2016 but slightly decreased from the 58% on 01/01/2016.      09/07/2016 Echocardiogram    MUGA- Normal LEFT ventricular ejection fraction of 59% with normal LV wall motion.      09/07/2016 Treatment Plan Change    Xeloda dose reduced to 1500 mg BID 7 days on and 7 days off.      09/15/2016 PET scan    No significant change in diffuse sclerotic bone metastases on CT images, although several show mildly increased FDG uptake since prior study.  No evidence of soft tissue metastatic disease.      11/02/2016 Imaging    MRI brain- No change from the  prior study. Previously noted metastatic deposits have been treated. No new lesions identified.        INTERVAL HISTORY:  [name] 51 y.o. female presents for continued follow-up for metastatic breast cancer.   She is here today with her husband. Currently taking Xeloda 1500 mg in the AM and PM. She takes the Tykerb daily.  She is due for monthly Zometa today.  Remains compliant with chemotherapy and has not missed any doses per her report.    Since her last visit 1 month ago, she is concerned about continued fatigue "no energy at all", and decreased appetite 25-50%.  Weight is stable.  She has been seeing an ophthalmologist for macular edema; she has been having (L) eyelid "twitching" for several days that is annoying for her. She tells me that she has follow-up scheduled with her eye doctor soon.    Reports 1 episode of chest pain; she states that it came on suddenly. Denies any associated symptoms at the time including (L) arm pain, jaw pain, or diaphoresis.  Pain resolved with OTC chewable Tums "and then I fell asleep."  No recurrent chest pain symptoms since that time. Reports occasional epigastric pain; she is currently taking Protonix once daily in the mornings.  Endorses periodic diarrhea as well, "especially right after I eat, I feel like I have to run to the bathroom."  She has intermittent dizziness; denies headaches.   Recently underwent ingrown toenail procedures. She has healed well from this.  Denies any concerns for infection to her feet; she has follow-up with podiatry per her report.   Continues to have back pain; remains on MS Contin Q12H and oxycodone PRN for pain. She also sees oncology masseuse, which has been helpful in relaxing her.    She is scheduled to see behavioral health physician sometime in October.    She is scheduled to start the [place] at the [facility] soon; she is excited about getting back  to being physically active.    She recently had a beach  vacation and had a wonderful time with her family.     REVIEW OF SYSTEMS:  Review of Systems - Oncology, per HPI.      PAST MEDICAL/SURGICAL HISTORY:  Past Medical History:  Diagnosis Date  . Anticoagulated    xarelto  . Anxiety   . Breast cancer metastasized to multiple sites [facility])    liver, brain, and bone  . Breast cancer, stage 4 [facility]) oncologist-  dr Larene [place] ([facility])   dx 12/ 2015 -- breast cancer Stage 4,  ER/HER2 +,  w/  liver, brain and  bone mets/  chemotherapy and radiation therapy  . Chronic pain syndrome    secondary to cancer   . Depression   . Drug-induced cardiomyopathy ([place])    per last MUGA (08-08-2015), ef 56.5/ per last echo 05-18-2014 ef 60-65%  . Family history of prostate cancer   . GERD (gastroesophageal reflux disease)   . History of colon polyps    07-13-2013  benign  . History of DVT (deep vein thrombosis)    07-09-2014  upper right extremity-  RIJ and right subclavian--  resolved  . History of gastritis    erosive  . History of pneumonia    HCAP 06-07-2015--  resolved per cxr 07-04-2015  . History of radiation therapy    12 fractions to L3 - S3, 30Gy and left spacula 20Gy (06-20-2014 to 07-10-2014) //  whole brain rxt (10-09-2014 to 10-26-2014)  . History of small bowel obstruction    S/P RESECTION 2008  . Migraine   . PONV (postoperative nausea and vomiting)    pt states scope patch does well   Past Surgical History:  Procedure Laterality Date  . BREAST REDUCTION SURGERY  03/17/2011   Procedure: MAMMARY REDUCTION BILATERAL (BREAST);  Surgeon: Mary A Contogiannis;  Location: [facility];  Service: Plastics;  Laterality: Bilateral;  . CATARACT EXTRACTION W/ INTRAOCULAR LENS  IMPLANT, BILATERAL  2008  . CERVICAL FUSION  2003   C5 -- C6  . COLONOSCOPY N/A 07/13/2013   Procedure: COLONOSCOPY;  Surgeon: Rogene Houston, MD;  [facility];  Service: Endoscopy;  Laterality: N/A;  930  . COLONOSCOPY N/A  11/26/2014   Procedure: COLONOSCOPY;  Surgeon: Rogene Houston, MD;  [facility];  Service: Endoscopy;  Laterality: N/A;  730  . D & C HYSTEROSCOPY/  RESECTION ENDOMETRIAL MASS/  [place] ENDOMETRIAL ABLATION  04-11-2010  . DX LAPAROSCOPY W/ PARTIAL SMALL BOWEL RESECTION AND APPENDECTOMY  04-13-2007  . ESOPHAGOGASTRODUODENOSCOPY N/A 05/25/2014   Procedure: ESOPHAGOGASTRODUODENOSCOPY (EGD);  Surgeon: Rogene Houston, MD;  [facility];  Service: Endoscopy;  Laterality: N/A;  155  . KNEE ARTHROSCOPY Right 2005  . LAPAROSCOPIC ASSISTED VAGINAL HYSTERECTOMY  10-13-2010   w/ Bx Left Fallopian tube and Aspiration Right Ovarian Cyst  . LAPAROSCOPIC CHOLECYSTECTOMY  11-17-2002  . LAPAROSCOPIC SALPINGO OOPHERECTOMY Bilateral 08/26/2015   Procedure: LAPAROSCOPIC SALPINGO OOPHORECTOMY, bilateral;  Surgeon: Everlene Farrier, MD;  Location: [facility];  Service: Gynecology;  Laterality: Bilateral;  . PORTACATH PLACEMENT  05-17-2014  . REDUCTION MAMMAPLASTY    . SHOULDER ARTHROSCOPY WITH ROTATOR CUFF REPAIR Right 2002  . TRANSTHORACIC ECHOCARDIOGRAM  05-18-2014   ef 60-65%//   last MUGA  (08-08-2015)  ef 56.6%       SOCIAL HISTORY:  Social History   Social History  . Marital status: Married  Spouse name: N/A  . Number of children: N/A  . Years of education: N/A   Occupational History  . Not on file.   Social History Main Topics  . Smoking status: Former Smoker    Types: Cigarettes    Quit date: 08/20/1994  . Smokeless tobacco: Never Used  . Alcohol use Yes     Comment: Occasionally  . Drug use: No  . Sexual activity: Not on file   Other Topics Concern  . Not on file   Social History Narrative  . No narrative on file    FAMILY HISTORY:  Family History  Problem Relation Age of Onset  . Diabetes Father   . Heart attack Maternal Grandmother 30       multiple over lifetime.  . Cancer Maternal Grandmother 73       NOS  . Prostate cancer Maternal  Grandfather        dx in his 97s  . Lung cancer Paternal Grandfather        dx <50  . Lymphoma Maternal Aunt        dx in her 72s  . Melanoma Cousin 11       maternal first cousin  . Brain cancer Cousin        paternal first cousin dx under 20  . Prostate cancer Other        MGF's father  . Colon cancer Other        MGM's mother    CURRENT MEDICATIONS:  Outpatient Encounter Prescriptions as of 02/02/2017  Medication Sig  . acetaminophen (TYLENOL) 325 MG tablet Take 650 mg by mouth every 6 (six) hours as needed for moderate pain or headache.  . calcium carbonate (TUMS - DOSED IN MG ELEMENTAL CALCIUM) 500 MG chewable tablet Chew 1 tablet by mouth daily.  . Calcium-Phosphorus-Vitamin D (CALCIUM/D3 ADULT GUMMIES PO) Take 2 tablets by mouth 2 (two) times daily. dosage of calcium '1200mg'$  and vitamin d '1000mg'$   . desvenlafaxine (PRISTIQ) 100 MG 24 hr tablet Take 1 tablet (100 mg total) by mouth daily.  Marland Kitchen desvenlafaxine (PRISTIQ) 50 MG 24 hr tablet Take 50 mg by mouth daily.  . diphenhydrAMINE (BENADRYL) 25 mg capsule Take 25 mg by mouth every 6 (six) hours as needed for itching.  . furosemide (LASIX) 20 MG tablet TAKE 1 TABLET BY MOUTH TWICE DAILY (Patient taking differently: taking once daily)  . gabapentin (NEURONTIN) 300 MG capsule Take 2 capsules (600 mg) at bedtime.  Marland Kitchen gentamicin cream (GARAMYCIN) 0.1 % Apply 1 application topically 3 (three) times daily.  . lapatinib (TYKERB) 250 MG tablet Take 5 tablets (1,250 mg total) by mouth daily.  Marland Kitchen lidocaine-prilocaine (EMLA) cream Apply 1 application topically daily as needed (apply to port before chemo).  . LORazepam (ATIVAN) 0.5 MG tablet Take 1 tablet (0.5 mg total) by mouth every 6 (six) hours as needed for anxiety.  . magic mouthwash SOLN Swish and swallow 51m four times a day.  . Melatonin 10 MG CAPS Take 1 tablet by mouth. Pt states she takes one tablet at night for sleep  . methylphenidate (RITALIN) 20 MG tablet Take 1 tablet (20 mg  total) by mouth 2 (two) times daily. Take 1 tab in morning and 1 tab at lunch/before 1pm. (Patient taking differently: Take 20 mg by mouth daily. Take 1 tab in morning and 1 tab at lunch/before 1pm.)  . morphine (MS CONTIN) 30 MG 12 hr tablet Take 1 tablet (30 mg total) by mouth every  12 (twelve) hours.  . ondansetron (ZOFRAN) 8 MG tablet Take 1 tablet (8 mg total) by mouth every 8 (eight) hours as needed for nausea or vomiting.  Marland Kitchen oxyCODONE (OXY IR/ROXICODONE) 5 MG immediate release tablet Take 1-2 tablets (5-10 mg total) by mouth every 4 (four) hours as needed for moderate pain.  . pantoprazole (PROTONIX) 40 MG tablet TAKE 1 TABLET BY MOUTH EVERY DAY  . potassium chloride SA (K-DUR,KLOR-CON) 20 MEQ tablet Take 1 tablet (20 mEq total) by mouth 3 (three) times daily.  . promethazine (PHENERGAN) 25 MG tablet TAKE 1 TABLET BY MOUTH EVERY SIX HOURS AS NEEDED FOR NAUSEA OR VOMITING  . silver sulfADIAZINE (SILVADENE) 1 % cream Apply 1 application topically daily.  Alveda Reasons 20 MG TABS tablet TAKE 1 TABLET BY MOUTH EVERY DAY WITH SUPPER  . XELODA 500 MG tablet Take 1500 mg PO BID, 7 days on and 7 days off  . Zoledronic Acid (ZOMETA) 4 MG/100ML IVPB Inject 100 mLs (4 mg total) into the vein every 30 (thirty) days.  Marland Kitchen zolpidem (AMBIEN) 10 MG tablet TAKE 1 TABLET BY MOUTH AT BEDTIME AS NEEDED   No facility-administered encounter medications on file as of 02/02/2017.     ALLERGIES:  No Known Allergies   PHYSICAL EXAM:  ECOG Performance status: 1 - Symptomatic, but independent.   Vitals:   02/02/17 1124  BP: 123/68  Pulse: 90  Resp: 18  Temp: 98.2 F (36.8 C)  SpO2: 96%   Filed Weights   02/02/17 1124  Weight: 255 lb (115.7 kg)     Physical Exam  Constitutional: She is oriented to person, place, and time and well-developed, well-nourished, and in no distress.  HENT:  Head: Normocephalic.  Mouth/Throat: Oropharynx is clear and moist. No oropharyngeal exudate.  Eyes: Pupils are equal,  round, and reactive to light. Conjunctivae are normal. No scleral icterus.  Neck: Normal range of motion. Neck supple.  Cardiovascular: Normal rate and regular rhythm.   Pulmonary/Chest: Effort normal and breath sounds normal. No respiratory distress. She has no wheezes.  Abdominal: Soft. Bowel sounds are normal. There is tenderness (Mild epigastric tenderness).  Musculoskeletal: Normal range of motion. She exhibits no edema.  Lymphadenopathy:    She has no cervical adenopathy.       Right: No supraclavicular adenopathy present.       Left: No supraclavicular adenopathy present.  Neurological: She is alert and oriented to person, place, and time. No cranial nerve deficit. Gait normal.  Skin: Skin is warm and dry. No rash noted.  Psychiatric: Mood, memory, affect and judgment normal.  Nursing note and vitals reviewed.    LABORATORY DATA:  I have reviewed the labs as listed.  CBC    Component Value Date/Time   WBC 6.9 12/24/2016 1331   RBC 4.02 12/24/2016 1331   HGB 11.9 (L) 12/24/2016 1331   HCT 36.3 12/24/2016 1331   PLT 248 12/24/2016 1331   MCV 90.3 12/24/2016 1331   MCH 29.6 12/24/2016 1331   MCHC 32.8 12/24/2016 1331   RDW 18.2 (H) 12/24/2016 1331   LYMPHSABS 1.2 12/24/2016 1331   MONOABS 0.5 12/24/2016 1331   EOSABS 0.0 12/24/2016 1331   BASOSABS 0.0 12/24/2016 1331   CMP Latest Ref Rng & Units 12/24/2016 11/26/2016 10/29/2016  Glucose 65 - 99 mg/dL 137(H) 122(H) 122(H)  BUN 6 - 20 mg/dL '10 11 13  '$ Creatinine 0.44 - 1.00 mg/dL 0.89 0.92 0.77  Sodium 135 - 145 mmol/L 138 135 137  Potassium  3.5 - 5.1 mmol/L 3.1(L) 2.9(L) 3.4(L)  Chloride 101 - 111 mmol/L 99(L) 96(L) 97(L)  CO2 22 - 32 mmol/L '29 29 30  '$ Calcium 8.9 - 10.3 mg/dL 9.2 9.2 9.3  Total Protein 6.5 - 8.1 g/dL 7.0 7.4 7.2  Total Bilirubin 0.3 - 1.2 mg/dL 0.6 0.6 0.3  Alkaline Phos 38 - 126 U/L 102 99 93  AST 15 - 41 U/L '21 23 20  '$ ALT 14 - 54 U/L '15 17 17         '$ PENDING LABS:     DIAGNOSTIC IMAGING:    Most recent PET scan: 01/20/17 CLINICAL DATA:  Subsequent treatment strategy for breast cancer.  EXAM: NUCLEAR MEDICINE PET SKULL BASE TO THIGH  TECHNIQUE: 12.4 mCi F-18 FDG was injected intravenously. Full-ring PET imaging was performed from the skull base to thigh after the radiotracer. CT data was obtained and used for attenuation correction and anatomic localization.  FASTING BLOOD GLUCOSE:  Value: 114 mg/dl  COMPARISON:  09/16/2026  FINDINGS: NECK: No hypermetabolic lymph nodes in the neck.  CHEST: No hypermetabolic mediastinal or hilar nodes. No suspicious pulmonary nodules on the CT scan. Right Port-A-Cath tip is in the distal SVC.  ABDOMEN/PELVIS: No abnormal hypermetabolic activity within the liver, pancreas, adrenal glands, or spleen. No hypermetabolic lymph nodes in the abdomen or pelvis.  SKELETON: Multiple hypermetabolic bone metastases are again noted. Some of the show stable FDG accumulation (right glenoid lesion with SUV max = 12.3 today compared to 12.4 previously). Some of the bone lesions have improved in the interval (sclerotic T7 lesion with SUV max = 5.7 today compared 11.8 previously). Some of the bone lesions show interval increase in hypermetabolism (left iliac crest lesion with SUV max = 16.9 today compared to 12.8 Previously) and the previously described progressive right hip lesion with uptake in the right femoral neck increasing to an SUV max = 16.2 today from 11.3 previously . Some of the hypermetabolic bone lesions are new in the interval (left T8 transverse process lesion with SUV max = 5.7) Some of the sclerotic bone lesion show no hypermetabolism on either study (L4 lesion).  IMPRESSION: 1. No evidence for hypermetabolic soft tissue metastases. 2. No marked change in hypermetabolic bone metastases with some variability in bony lesion response as some lesions appear stable while others have either progressed or  decreased.   Electronically Signed   By: Misty Stanley M.D.   On: 01/20/2017 14:34    Most recent MUGA scan: 01/18/17 CLINICAL DATA:  Stage IV breast cancer with osseous metastases, cardiotoxic chemotherapy  EXAM: NUCLEAR MEDICINE CARDIAC BLOOD POOL IMAGING (MUGA)  TECHNIQUE: Cardiac multi-gated acquisition was performed at rest following intravenous injection of Tc-29mlabeled red blood cells.  RADIOPHARMACEUTICALS:  22 mCi [id number]-vitro labeled autologous red blood cells IV  COMPARISON:  09/07/2016  FINDINGS: Calculated LEFT ventricular ejection fraction is 61%, within the normal range.  This is not significantly changed from the 59% on the previous exam.  Wall motion analysis of the LEFT ventricle in 3 projections is normal.  IMPRESSION: Normal LEFT ventricular ejection fraction of 61% little changed from the 59% on the previous exam.  Normal LV wall motion.   Electronically Signed   By: MarLavonia DanaD.   On: 01/18/2017 15:26     MRI brain: 11/02/16 CLINICAL DATA:  Metastatic breast cancer. Whole-brain radiation and chemotherapy  EXAM: MRI HEAD WITHOUT AND WITH CONTRAST  TECHNIQUE: Multiplanar, multiecho pulse sequences of the brain and surrounding structures were  obtained without and with intravenous contrast.  CONTRAST:  63m MULTIHANCE GADOBENATE DIMEGLUMINE 529 MG/ML IV SOLN  COMPARISON:  MRI 05/19/2016  FINDINGS: Brain: Negative for enhancing metastatic deposits in the brain. Stable 3 mm T2 hyperintensity in the head of the caudate on the right is unchanged compatible with treated tumor. Other metastatic deposit is no longer enhance. No new lesions identified.  Ventricle size normal. Negative for acute infarct. Mild chronic white matter changes stable. Negative for hemorrhage or mass.  Vascular: Normal arterial flow void  Skull and upper cervical spine: Negative  Sinuses/Orbits: Negative  Other:  None  IMPRESSION: No change from the prior study. Previously noted metastatic deposits have been treated. No new lesions identified.   Electronically Signed   By: CFranchot GalloM.D.   On: 11/02/2016 17:02     PATHOLOGY:  [employer]: 05/25/14          ASSESSMENT & PLAN:   Stage IV metastatic breast adenocarcinoma with liver, bone, brain, and spine mets; ER+/HER2+: -Currently taking Xeloda 1500 mg BID 7 days on and 7 days off with Tykerb daily.  -Tumor markers (CA 27-29 & CA 15-3) have been slowly increasing over time.  Most recent PET scan results reviewed in detail with patient and her husband today. We reviewed the images together as well.  Overall, disease is stable with mixed response (some areas of increased hypermetabolism/growth, and other areas of response).    -Discussed with Dr. ZTalbert Cage She would recommend continuing current treatment with Xeloda/Tykerb.  -Patient has been having issues with insurance coverage for various tests and medications.  Reports that speciality pharmacy was able to fill the Tykerb, but not the Xeloda.  I will touch base with ADurene Cal our patient advocate, to see if she can be of some assistance with these concerns.   -Last MUGA scan 01/18/17 normal with EF 61%.  She will be due for next cardiac evaluation given potential cardiotoxicity of Tykerb therapy in 04/2017; will place orders at subsequent visits.  -Continue current treatment with Xeloda and Tykerb.  -Return to cancer center in 1 month for continued follow-up.  Hypokalemia:  -Likely secondary to Lasix, which she is taking once daily.   -K low at 3.2 today; she reports that she has been taking her potassium TID as prescribed.  Offered replacement with IV KCl today, but she would like to come back another day to do this, which is reasonable.  Sign & held orders for 40 mEq IV KCl for sometime later this week or next week.   -For now, will continue TID dosing of potassium.   -If  potassium is still low at next visit, then will consider switching her diuretic to Aldactone, which will spare potassium.   ? Gastritis/Colitis:  -Mild epigastric tenderness on palpation on exam today. Intermittent reported pain to bilateral lower quadrants of abdomen; none on exam today.  Also recent chest pain episode, which sounds like it may have been GI distress and not cardiac, as it resolved with OTC antiacids.  -Will increase her Protonix to Q12H (from daily). If symptoms persist, then will refer her back to GI for additional evaluation. -Gave her strict instructions that if she has recurrent chest pain episode, then she should report to ED, as it is often clinically difficult to tell the difference between GI distress and heart attack, particularly in women. She voiced understanding and agreed.   Bone mets:  -Continue monthly Zometa. Due for next dose today.  -Serum calcium reviewed and adequate  for administration of Zometa today.   Depression & anxiety:  -She is scheduled for psychiatry consultation in October per her report. She is going to [employer] [facility] in [place]; we appreciate their recommendations to best manage her anxiety and depression.   -Continue Pristiq daily for now, with intermittent Ativan PRN.   Cancer-related fatigue:  -Continue Ritalin 20 mg BID, although she historically only takes once per day.  She generally does not take the medication BID, but it is helpful with first dose in the mornings.    In the future, I would be reluctant to further increase the dose given tachycardia and possibility of cardiotoxicity with Tykerb. -If psychiatry feels that she may benefit from different stimulant for cancer-related fatigue, we certainly welcome those recommendations as well.   Chronic pain secondary to neoplasm:  -MS Contin effective in managing her pain.  Maintain current dose of MS Contin for basal pain control with oxycodone PRN.  -No reported concerns  about constipation; she understands the importance of adequate bowel regimen with opiates.   Macular edema:  -Continue follow-up with ophthalmology as directed.       Dispo:  -Continue Zometa infusion monthly.  -Return to cancer center in 1 month for follow-up.    All questions were answered to patient's stated satisfaction. Encouraged patient to call with any new concerns or questions before her next visit to the cancer center and we can certain see her sooner, if needed.    Plan of care discussed with Dr. Talbert Cage, who agrees with the above aforementioned.    Orders placed this encounter:  No orders of the defined types were placed in this encounter.     Mike Craze, NP [place] [phone]

## 2017-02-02 ENCOUNTER — Encounter (HOSPITAL_COMMUNITY): Payer: Medicare Other | Attending: Oncology

## 2017-02-02 ENCOUNTER — Encounter (HOSPITAL_COMMUNITY): Payer: Self-pay | Admitting: Adult Health

## 2017-02-02 ENCOUNTER — Encounter (HOSPITAL_BASED_OUTPATIENT_CLINIC_OR_DEPARTMENT_OTHER): Payer: Medicare Other | Admitting: Adult Health

## 2017-02-02 ENCOUNTER — Other Ambulatory Visit (HOSPITAL_COMMUNITY): Payer: Self-pay | Admitting: Adult Health

## 2017-02-02 VITALS — BP 123/68 | HR 90 | Temp 98.2°F | Resp 18 | Ht 68.0 in | Wt 255.0 lb

## 2017-02-02 DIAGNOSIS — E876 Hypokalemia: Secondary | ICD-10-CM

## 2017-02-02 DIAGNOSIS — C787 Secondary malignant neoplasm of liver and intrahepatic bile duct: Secondary | ICD-10-CM | POA: Diagnosis not present

## 2017-02-02 DIAGNOSIS — C7931 Secondary malignant neoplasm of brain: Secondary | ICD-10-CM

## 2017-02-02 DIAGNOSIS — C50919 Malignant neoplasm of unspecified site of unspecified female breast: Secondary | ICD-10-CM

## 2017-02-02 DIAGNOSIS — R530 Neoplastic (malignant) related fatigue: Secondary | ICD-10-CM

## 2017-02-02 DIAGNOSIS — Z23 Encounter for immunization: Secondary | ICD-10-CM | POA: Diagnosis not present

## 2017-02-02 DIAGNOSIS — R630 Anorexia: Secondary | ICD-10-CM | POA: Diagnosis not present

## 2017-02-02 DIAGNOSIS — R5382 Chronic fatigue, unspecified: Secondary | ICD-10-CM

## 2017-02-02 DIAGNOSIS — C7951 Secondary malignant neoplasm of bone: Secondary | ICD-10-CM | POA: Diagnosis not present

## 2017-02-02 DIAGNOSIS — C229 Malignant neoplasm of liver, not specified as primary or secondary: Secondary | ICD-10-CM | POA: Diagnosis not present

## 2017-02-02 DIAGNOSIS — F418 Other specified anxiety disorders: Secondary | ICD-10-CM | POA: Diagnosis not present

## 2017-02-02 DIAGNOSIS — R42 Dizziness and giddiness: Secondary | ICD-10-CM | POA: Diagnosis not present

## 2017-02-02 DIAGNOSIS — G893 Neoplasm related pain (acute) (chronic): Secondary | ICD-10-CM

## 2017-02-02 DIAGNOSIS — K219 Gastro-esophageal reflux disease without esophagitis: Secondary | ICD-10-CM

## 2017-02-02 LAB — CBC WITH DIFFERENTIAL/PLATELET
BASOS PCT: 0 %
Basophils Absolute: 0 10*3/uL (ref 0.0–0.1)
EOS ABS: 0 10*3/uL (ref 0.0–0.7)
Eosinophils Relative: 0 %
HEMATOCRIT: 36.2 % (ref 36.0–46.0)
HEMOGLOBIN: 11.7 g/dL — AB (ref 12.0–15.0)
LYMPHS ABS: 0.9 10*3/uL (ref 0.7–4.0)
Lymphocytes Relative: 19 %
MCH: 29.3 pg (ref 26.0–34.0)
MCHC: 32.3 g/dL (ref 30.0–36.0)
MCV: 90.7 fL (ref 78.0–100.0)
Monocytes Absolute: 0.4 10*3/uL (ref 0.1–1.0)
Monocytes Relative: 9 %
NEUTROS ABS: 3.3 10*3/uL (ref 1.7–7.7)
NEUTROS PCT: 72 %
Platelets: 229 10*3/uL (ref 150–400)
RBC: 3.99 MIL/uL (ref 3.87–5.11)
RDW: 18 % — ABNORMAL HIGH (ref 11.5–15.5)
WBC: 4.6 10*3/uL (ref 4.0–10.5)

## 2017-02-02 LAB — COMPREHENSIVE METABOLIC PANEL
ALBUMIN: 3.8 g/dL (ref 3.5–5.0)
ALK PHOS: 94 U/L (ref 38–126)
ALT: 19 U/L (ref 14–54)
AST: 27 U/L (ref 15–41)
Anion gap: 9 (ref 5–15)
BUN: 8 mg/dL (ref 6–20)
CO2: 30 mmol/L (ref 22–32)
Calcium: 9.3 mg/dL (ref 8.9–10.3)
Chloride: 99 mmol/L — ABNORMAL LOW (ref 101–111)
Creatinine, Ser: 0.8 mg/dL (ref 0.44–1.00)
GFR calc Af Amer: >60 mL/min (ref >60)
GFR calc non Af Amer: >60 mL/min (ref >60)
Glucose, Bld: 136 mg/dL — ABNORMAL HIGH (ref 65–99)
Potassium: 3.2 mmol/L — ABNORMAL LOW (ref 3.5–5.1)
SODIUM: 138 mmol/L (ref 135–145)
TOTAL PROTEIN: 7.4 g/dL (ref 6.5–8.1)
Total Bilirubin: 0.3 mg/dL (ref 0.3–1.2)

## 2017-02-02 MED ORDER — HEPARIN SOD (PORK) LOCK FLUSH 100 UNIT/ML IV SOLN
500.0000 [IU] | Freq: Once | INTRAVENOUS | Status: AC
  Administered 2017-02-02: 500 [IU] via INTRAVENOUS

## 2017-02-02 MED ORDER — INFLUENZA VAC SPLIT QUAD 0.5 ML IM SUSY
PREFILLED_SYRINGE | INTRAMUSCULAR | Status: AC
  Filled 2017-02-02: qty 0.5

## 2017-02-02 MED ORDER — INFLUENZA VAC SPLIT QUAD 0.5 ML IM SUSY
0.5000 mL | PREFILLED_SYRINGE | Freq: Once | INTRAMUSCULAR | Status: AC
  Administered 2017-02-02: 0.5 mL via INTRAMUSCULAR

## 2017-02-02 MED ORDER — HEPARIN SOD (PORK) LOCK FLUSH 100 UNIT/ML IV SOLN
INTRAVENOUS | Status: AC
  Filled 2017-02-02: qty 5

## 2017-02-02 MED ORDER — ZOLEDRONIC ACID 4 MG/100ML IV SOLN
4.0000 mg | Freq: Once | INTRAVENOUS | Status: AC
  Administered 2017-02-02: 4 mg via INTRAVENOUS
  Filled 2017-02-02: qty 100

## 2017-02-02 MED ORDER — SODIUM CHLORIDE 0.9 % IV SOLN
Freq: Once | INTRAVENOUS | Status: AC
  Administered 2017-02-02: 12:00:00 via INTRAVENOUS

## 2017-02-02 MED ORDER — PANTOPRAZOLE SODIUM 40 MG PO TBEC: 40.0000 mg | DELAYED_RELEASE_TABLET | Freq: Two times a day (BID) | ORAL | 3 refills | Status: DC

## 2017-02-02 NOTE — Progress Notes (Signed)
Tolerated infusion w/o adverse reaction.  Alert, in no distress.  VSS.  Discharged ambulatory in c/o spouse.  

## 2017-02-03 ENCOUNTER — Telehealth (HOSPITAL_COMMUNITY): Payer: Self-pay | Admitting: *Deleted

## 2017-02-03 ENCOUNTER — Other Ambulatory Visit (HOSPITAL_COMMUNITY): Payer: Self-pay | Admitting: Oncology

## 2017-02-03 DIAGNOSIS — R5382 Chronic fatigue, unspecified: Secondary | ICD-10-CM

## 2017-02-03 DIAGNOSIS — C50919 Malignant neoplasm of unspecified site of unspecified female breast: Secondary | ICD-10-CM

## 2017-02-03 DIAGNOSIS — C229 Malignant neoplasm of liver, not specified as primary or secondary: Secondary | ICD-10-CM

## 2017-02-03 DIAGNOSIS — C7951 Secondary malignant neoplasm of bone: Secondary | ICD-10-CM

## 2017-02-03 DIAGNOSIS — Z0000 Encounter for general adult medical examination without abnormal findings: Secondary | ICD-10-CM

## 2017-02-03 LAB — CANCER ANTIGEN 27.29: CA 27.29: 77.9 U/mL — ABNORMAL HIGH (ref 0.0–38.6)

## 2017-02-03 LAB — CANCER ANTIGEN 15-3: CAN 15 3: 74.8 U/mL — AB (ref 0.0–25.0)

## 2017-02-03 MED ORDER — MORPHINE SULFATE ER 30 MG PO TBCR: 30.0000 mg | EXTENDED_RELEASE_TABLET | Freq: Two times a day (BID) | ORAL | 0 refills | Status: DC

## 2017-02-05 ENCOUNTER — Telehealth (HOSPITAL_COMMUNITY): Payer: Self-pay

## 2017-02-05 NOTE — Telephone Encounter (Signed)
Patient called stating she was having chest pain that was radiating up in her throat and she was sweating. Reviewed with RN. Instructed patient she should go to the ER to be checked out. Patient asked what to do if she quit hurting in a little bit. I explained to patient the decision was ultimately hers concerning what to do but our advice is to go to the ER to be safe. Patient verbalized understanding.

## 2017-02-08 ENCOUNTER — Other Ambulatory Visit (HOSPITAL_COMMUNITY): Payer: Self-pay | Admitting: Adult Health

## 2017-02-08 ENCOUNTER — Other Ambulatory Visit (HOSPITAL_COMMUNITY): Payer: Self-pay | Admitting: Oncology

## 2017-02-08 DIAGNOSIS — C50919 Malignant neoplasm of unspecified site of unspecified female breast: Secondary | ICD-10-CM

## 2017-02-08 DIAGNOSIS — C7951 Secondary malignant neoplasm of bone: Secondary | ICD-10-CM

## 2017-02-08 DIAGNOSIS — C229 Malignant neoplasm of liver, not specified as primary or secondary: Secondary | ICD-10-CM

## 2017-02-10 DIAGNOSIS — H26492 Other secondary cataract, left eye: Secondary | ICD-10-CM | POA: Diagnosis not present

## 2017-02-10 DIAGNOSIS — Z961 Presence of intraocular lens: Secondary | ICD-10-CM | POA: Diagnosis not present

## 2017-02-10 DIAGNOSIS — H35341 Macular cyst, hole, or pseudohole, right eye: Secondary | ICD-10-CM | POA: Diagnosis not present

## 2017-02-10 DIAGNOSIS — H43823 Vitreomacular adhesion, bilateral: Secondary | ICD-10-CM | POA: Diagnosis not present

## 2017-02-11 ENCOUNTER — Ambulatory Visit (HOSPITAL_COMMUNITY): Payer: Self-pay

## 2017-02-12 ENCOUNTER — Encounter (HOSPITAL_COMMUNITY): Payer: Self-pay

## 2017-02-12 ENCOUNTER — Encounter (HOSPITAL_COMMUNITY): Payer: Medicare Other | Attending: Oncology

## 2017-02-12 VITALS — BP 118/74 | HR 68 | Temp 98.2°F | Resp 18

## 2017-02-12 DIAGNOSIS — C229 Malignant neoplasm of liver, not specified as primary or secondary: Secondary | ICD-10-CM | POA: Insufficient documentation

## 2017-02-12 DIAGNOSIS — C7931 Secondary malignant neoplasm of brain: Secondary | ICD-10-CM

## 2017-02-12 DIAGNOSIS — C50919 Malignant neoplasm of unspecified site of unspecified female breast: Secondary | ICD-10-CM | POA: Diagnosis not present

## 2017-02-12 DIAGNOSIS — C787 Secondary malignant neoplasm of liver and intrahepatic bile duct: Secondary | ICD-10-CM | POA: Diagnosis not present

## 2017-02-12 DIAGNOSIS — E876 Hypokalemia: Secondary | ICD-10-CM

## 2017-02-12 DIAGNOSIS — C7951 Secondary malignant neoplasm of bone: Secondary | ICD-10-CM | POA: Diagnosis not present

## 2017-02-12 LAB — POTASSIUM: Potassium: 3.2 mmol/L — ABNORMAL LOW (ref 3.5–5.1)

## 2017-02-12 MED ORDER — SODIUM CHLORIDE 0.9% FLUSH
10.0000 mL | INTRAVENOUS | Status: DC | PRN
  Administered 2017-02-12: 10 mL via INTRAVENOUS
  Filled 2017-02-12: qty 10

## 2017-02-12 MED ORDER — HEPARIN SOD (PORK) LOCK FLUSH 100 UNIT/ML IV SOLN
500.0000 [IU] | Freq: Once | INTRAVENOUS | Status: AC
  Administered 2017-02-12: 500 [IU] via INTRAVENOUS

## 2017-02-12 MED ORDER — SODIUM CHLORIDE 0.9 % IV SOLN
INTRAVENOUS | Status: DC
  Administered 2017-02-12: 11:00:00 via INTRAVENOUS

## 2017-02-12 MED ORDER — POTASSIUM CHLORIDE 10 MEQ/100ML IV SOLN
10.0000 meq | INTRAVENOUS | Status: AC
  Administered 2017-02-12 (×4): 10 meq via INTRAVENOUS
  Filled 2017-02-12 (×4): qty 100

## 2017-02-12 NOTE — Patient Instructions (Signed)
[place] at [facility] Discharge Instructions  RECOMMENDATIONS MADE BY THE CONSULTANT AND ANY TEST RESULTS WILL BE SENT TO YOUR REFERRING PHYSICIAN.  Received Potassium infusions today. Follow-up as scheduled. Call clinic for any questions or concerns  Thank you for choosing [place] at [facility] to provide your oncology and hematology care.  To afford each patient quality time with our provider, please arrive at least 15 minutes before your scheduled appointment time.    If you have a lab appointment with the [facility] please come in thru the  Main Entrance and check in at the main information desk  You need to re-schedule your appointment should you arrive 10 or more minutes late.  We strive to give you quality time with our providers, and arriving late affects you and other patients whose appointments are after yours.  Also, if you no show three or more times for appointments you may be dismissed from the clinic at the providers discretion.     Again, thank you for choosing [facility].  Our hope is that these requests will decrease the amount of time that you wait before being seen by our physicians.       _____________________________________________________________  Should you have questions after your visit to [facility], please contact our office at [phone] between the hours of 8:30 a.m. and 4:30 p.m.  Voicemails left after 4:30 p.m. will not be returned until the following business day.  For prescription refill requests, have your pharmacy contact our office.       Resources For Cancer Patients and their Caregivers ? American Cancer Society: Can assist with transportation, wigs, general needs, runs Look Good Feel Better.        505 343 3143 ? Cancer Care: Provides financial assistance, online support groups, medication/co-pay assistance.  1-800-813-HOPE 684-708-1155) ? [facility] Assists [place] cancer patients and their families through emotional , educational and financial support.  908-685-8243 ? [place] DSS Where to apply for food stamps, Medicaid and utility assistance. 419-667-9113 ? [facility]: Transportation to medical appointments. (801)595-4490 ? Social Security Administration: May apply for disability if have a Stage IV cancer. 760-363-4084 980-382-5032 ? [employer], Disability and Transit Services: Assists with nutrition, care and transit needs. [place] Support Programs: @10RELATIVEDAYS @ > Cancer Support Group  2nd Tuesday of the month 1pm-2pm, Journey Room  > Creative Journey  3rd Tuesday of the month 1130am-1pm, Journey Room  > Look Good Feel Better  1st Wednesday of the month 10am-12 noon, Journey Room (Call [place] to register [phone])

## 2017-02-12 NOTE — Progress Notes (Signed)
[name] tolerated 4 Potassium infusions well without complaints or incident.K+ lab was repeated today per MD order prior to administering these infusions. VSS upon discharge. Pt discharged self ambulatory in satisfactory condition accompanied by her husband

## 2017-02-17 ENCOUNTER — Other Ambulatory Visit (HOSPITAL_COMMUNITY): Payer: Self-pay | Admitting: Oncology

## 2017-02-17 ENCOUNTER — Ambulatory Visit (INDEPENDENT_AMBULATORY_CARE_PROVIDER_SITE_OTHER): Payer: Medicare Other | Admitting: Psychiatry

## 2017-02-17 ENCOUNTER — Encounter (HOSPITAL_COMMUNITY): Payer: Self-pay | Admitting: Psychiatry

## 2017-02-17 ENCOUNTER — Other Ambulatory Visit (HOSPITAL_COMMUNITY): Payer: Self-pay | Admitting: Adult Health

## 2017-02-17 VITALS — BP 128/78 | HR 89 | Ht 68.0 in | Wt 256.0 lb

## 2017-02-17 DIAGNOSIS — G4700 Insomnia, unspecified: Secondary | ICD-10-CM | POA: Diagnosis not present

## 2017-02-17 DIAGNOSIS — F419 Anxiety disorder, unspecified: Secondary | ICD-10-CM

## 2017-02-17 DIAGNOSIS — C50919 Malignant neoplasm of unspecified site of unspecified female breast: Secondary | ICD-10-CM

## 2017-02-17 DIAGNOSIS — R4584 Anhedonia: Secondary | ICD-10-CM | POA: Diagnosis not present

## 2017-02-17 DIAGNOSIS — R450 Nervousness: Secondary | ICD-10-CM | POA: Diagnosis not present

## 2017-02-17 DIAGNOSIS — F332 Major depressive disorder, recurrent severe without psychotic features: Secondary | ICD-10-CM | POA: Diagnosis not present

## 2017-02-17 DIAGNOSIS — Z87891 Personal history of nicotine dependence: Secondary | ICD-10-CM

## 2017-02-17 DIAGNOSIS — G4730 Sleep apnea, unspecified: Secondary | ICD-10-CM | POA: Diagnosis not present

## 2017-02-17 DIAGNOSIS — R5382 Chronic fatigue, unspecified: Secondary | ICD-10-CM

## 2017-02-17 DIAGNOSIS — G4710 Hypersomnia, unspecified: Secondary | ICD-10-CM

## 2017-02-17 DIAGNOSIS — R5383 Other fatigue: Secondary | ICD-10-CM | POA: Diagnosis not present

## 2017-02-17 DIAGNOSIS — C229 Malignant neoplasm of liver, not specified as primary or secondary: Secondary | ICD-10-CM

## 2017-02-17 DIAGNOSIS — C7931 Secondary malignant neoplasm of brain: Secondary | ICD-10-CM

## 2017-02-17 NOTE — Progress Notes (Signed)
Psychiatric Initial Adult Assessment   Patient Identification: MORGANNA STYLES MRN:  [id number] Date of Evaluation:  02/17/2017 Referral Source: self, pcp Chief Complaint:  depression Visit Diagnosis:    ICD-10-CM   1. Sleep-disordered breathing G47.30 Polysomnography 4 or more parameters  2. Severe episode of recurrent major depressive disorder, without psychotic features ([place]) F33.2 Ambulatory referral to Psychiatry    History of Present Illness:  Breeonna H Drummer is a 51 year old female with stage IV metastatic breast cancer, with prior brain metastases status post radiation, who presents today for psychiatric intake assessment. She has 1 prior psychiatric hospitalization in 2008 for severe depression in the setting of family stressors. She is previously been [facility], but has not return to that clinic due to insurance changes.   She has been tried on Prozac, Effexor, Zoloft, currently on Pristiq. She also takes Ritalin daily for cognitive and memory symptoms related to brain radiation, and takes Ambien at night for sleep. She uses lorazepam sparingly for periods of severe anxiety associated with nausea and chemotherapy. She currently continues on oral chemotherapy agents.    She presents today with ongoing depressive symptoms, and significant grief at the life she is no longer able to lead. She was previously a Psychologist, sport and exercise, quite active, enjoyed traveling, and has not been able to work for the past 3 years since she was diagnosed with breast cancer and undergone medical treatments. She reports that she tends to stay home most days, and finds it difficult to enjoy activities and hobbies that she used to enjoy. 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  She has trouble sleeping at night, due to rumination and worry, but also snores heavily and sometimes wakes herself up from snoring so vigorously. She has a family history of  sleep apnea but has not herself had a sleep study. She struggles with low energy during the day and chronic headaches. She had a recent MRI in June 2018 which was clean in terms of no new metastatic deposits. She has no history of seizures.  I spent time with the patient discussing quality of life and my hope for her to have relief from her depression. I educated her on ECT as being the most aggressive form of treatment for major depressive disorder, given her multiple past medication trials, and presumed treatment resistant depression, I suspect she would benefit from an ECT consultation. I would also like to move forward with a polysomnography  to investigate for any sleep apnea contributing to treatment resistance. She has excellent support from her husband who would be able to drive her to and from ECT treatments, as he is retired.    She denies any acute suicidality or safety issues. She does not have any auditory or visual hallucinations. She has never engaged in any substance abuse. She is agreeable to the recommendations discussed and to follow-up with this writer in 10-12 weeks.   Associated Signs/Symptoms: Depression Symptoms:  depressed mood, anhedonia, insomnia, hypersomnia, psychomotor retardation, fatigue, feelings of worthlessness/guilt, difficulty concentrating, hopelessness, impaired memory, (Hypo) Manic Symptoms:  none Anxiety Symptoms:  Excessive Worry, Psychotic Symptoms:  none PTSD Symptoms: Negative  Past Psychiatric History: One psychiatric hospitalization in 2008 in the setting of severe depression and estrangement from her daughter, no past suicide attempts  Previous Psychotropic Medications: Yes Effexor, Zoloft, Prozac, Pristiq  Substance Abuse History in the last 12 months:  No.  Consequences of Substance Abuse: Negative  Past Medical History:  Past Medical History:  Diagnosis Date  . Anticoagulated    xarelto  . Anxiety   . Breast cancer metastasized  to multiple sites [facility])    liver, brain, and bone  . Breast cancer, stage 4 [facility]) oncologist-  dr Larene [place] ([facility])   dx 12/ 2015 -- breast cancer Stage 4,  ER/HER2 +,  w/  liver, brain and  bone mets/  chemotherapy and radiation therapy  . Chronic pain syndrome    secondary to cancer   . Depression   . Drug-induced cardiomyopathy ([place])    per last MUGA (08-08-2015), ef 56.5/ per last echo 05-18-2014 ef 60-65%  . Family history of prostate cancer   . GERD (gastroesophageal reflux disease)   . History of colon polyps    07-13-2013  benign  . History of DVT (deep vein thrombosis)    07-09-2014  upper right extremity-  RIJ and right subclavian--  resolved  . History of gastritis    erosive  . History of pneumonia    HCAP 06-07-2015--  resolved per cxr 07-04-2015  . History of radiation therapy    12 fractions to L3 - S3, 30Gy and left spacula 20Gy (06-20-2014 to 07-10-2014) //  whole brain rxt (10-09-2014 to 10-26-2014)  . History of small bowel obstruction    S/P RESECTION 2008  . Migraine   . PONV (postoperative nausea and vomiting)    pt states scope patch does well    Past Surgical History:  Procedure Laterality Date  . BREAST REDUCTION SURGERY  03/17/2011   Procedure: MAMMARY REDUCTION BILATERAL (BREAST);  Surgeon: Mary A Contogiannis;  Location: [facility];  Service: Plastics;  Laterality: Bilateral;  . CATARACT EXTRACTION W/ INTRAOCULAR LENS  IMPLANT, BILATERAL  2008  . CERVICAL FUSION  2003   C5 -- C6  . COLONOSCOPY N/A 07/13/2013   Procedure: COLONOSCOPY;  Surgeon: Rogene Houston, MD;  [facility];  Service: Endoscopy;  Laterality: N/A;  930  . COLONOSCOPY N/A 11/26/2014   Procedure: COLONOSCOPY;  Surgeon: Rogene Houston, MD;  [facility];  Service: Endoscopy;  Laterality: N/A;  730  . D & C HYSTEROSCOPY/  RESECTION ENDOMETRIAL MASS/  [place] ENDOMETRIAL ABLATION  04-11-2010  . DX LAPAROSCOPY W/ PARTIAL SMALL BOWEL  RESECTION AND APPENDECTOMY  04-13-2007  . ESOPHAGOGASTRODUODENOSCOPY N/A 05/25/2014   Procedure: ESOPHAGOGASTRODUODENOSCOPY (EGD);  Surgeon: Rogene Houston, MD;  [facility];  Service: Endoscopy;  Laterality: N/A;  155  . KNEE ARTHROSCOPY Right 2005  . LAPAROSCOPIC ASSISTED VAGINAL HYSTERECTOMY  10-13-2010   w/ Bx Left Fallopian tube and Aspiration Right Ovarian Cyst  . LAPAROSCOPIC CHOLECYSTECTOMY  11-17-2002  . LAPAROSCOPIC SALPINGO OOPHERECTOMY Bilateral 08/26/2015   Procedure: LAPAROSCOPIC SALPINGO OOPHORECTOMY, bilateral;  Surgeon: Everlene Farrier, MD;  Location: [facility];  Service: Gynecology;  Laterality: Bilateral;  . PORTACATH PLACEMENT  05-17-2014  . REDUCTION MAMMAPLASTY    . SHOULDER ARTHROSCOPY WITH ROTATOR CUFF REPAIR Right 2002  . TRANSTHORACIC ECHOCARDIOGRAM  05-18-2014   ef 60-65%//   last MUGA  (08-08-2015)  ef 56.6%      Family Psychiatric History: Her biological sister has bipolar disorder  Family History:  Family History  Problem Relation Age of Onset  . Diabetes Father   . Heart attack Maternal Grandmother 30       multiple over lifetime.  . Cancer Maternal Grandmother 88       NOS  . Prostate  cancer Maternal Grandfather        dx in his 39s  . Lung cancer Paternal Grandfather        dx <50  . Lymphoma Maternal Aunt        dx in her 65s  . Melanoma Cousin 24       maternal first cousin  . Brain cancer Cousin        paternal first cousin dx under 76  . Prostate cancer Other        MGF's father  . Colon cancer Other        MGM's mother    Social History:   Social History   Social History  . Marital status: Married    Spouse name: N/A  . Number of children: N/A  . Years of education: N/A   Social History Main Topics  . Smoking status: Former Smoker    Types: Cigarettes    Quit date: 08/20/1994  . Smokeless tobacco: Never Used  . Alcohol use Yes     Comment: Occasionally  . Drug use: No  . Sexual activity: Not  Asked   Other Topics Concern  . None   Social History Narrative  . None    Additional Social History: Currently lives with her husband of 18 years  Allergies:  No Known Allergies  Metabolic Disorder Labs: No results found for: HGBA1C, MPG No results found for: PROLACTIN No results found for: CHOL, TRIG, HDL, CHOLHDL, VLDL, LDLCALC   Current Medications: Current Outpatient Prescriptions  Medication Sig Dispense Refill  . acetaminophen (TYLENOL) 325 MG tablet Take 650 mg by mouth every 6 (six) hours as needed for moderate pain or headache.    . calcium carbonate (TUMS - DOSED IN MG ELEMENTAL CALCIUM) 500 MG chewable tablet Chew 1 tablet by mouth daily.    . Calcium-Phosphorus-Vitamin D (CALCIUM/D3 ADULT GUMMIES PO) Take 2 tablets by mouth 2 (two) times daily. dosage of calcium 1238m and vitamin d 1004106m   . desvenlafaxine (PRISTIQ) 100 MG 24 hr tablet Take 1 tablet (100 mg total) by mouth daily. 30 tablet 3  . diphenhydrAMINE (BENADRYL) 25 mg capsule Take 25 mg by mouth every 6 (six) hours as needed for itching.    . furosemide (LASIX) 20 MG tablet TAKE 1 TABLET BY MOUTH TWICE DAILY (Patient taking differently: taking once daily) 60 tablet 3  . gabapentin (NEURONTIN) 300 MG capsule TAKE TWO CAPSULES BY MOUTH AT BEDTIME 60 capsule 5  . lapatinib (TYKERB) 250 MG tablet Take 5 tablets (1,250 mg total) by mouth daily. 140 tablet 1  . lidocaine-prilocaine (EMLA) cream Apply 1 application topically daily as needed (apply to port before chemo). 30 g 2  . LORazepam (ATIVAN) 0.5 MG tablet Take 1 tablet (0.5 mg total) by mouth every 6 (six) hours as needed for anxiety. 30 tablet 1  . magic mouthwash SOLN Swish and swallow [id number] times a day. 480 mL 3  . Melatonin 10 MG CAPS Take 1 tablet by mouth. Pt states she takes one tablet at night for sleep    . methylphenidate (RITALIN) 20 MG tablet Take 1 tablet (20 mg total) by mouth 2 (two) times daily. Take 1 tab in morning and 1 tab at  lunch/before 1pm. (Patient taking differently: Take 20 mg by mouth daily. Take 1 tab in morning and 1 tab at lunch/before 1pm.) 60 tablet 0  . morphine (MS CONTIN) 30 MG 12 hr tablet Take 1 tablet (30 mg total) by mouth  every 12 (twelve) hours. 60 tablet 0  . ondansetron (ZOFRAN) 8 MG tablet Take 1 tablet (8 mg total) by mouth every 8 (eight) hours as needed for nausea or vomiting. 60 tablet 4  . oxyCODONE (OXY IR/ROXICODONE) 5 MG immediate release tablet Take 1-2 tablets (5-10 mg total) by mouth every 4 (four) hours as needed for moderate pain. 60 tablet 0  . pantoprazole (PROTONIX) 40 MG tablet Take 1 tablet (40 mg total) by mouth every 12 (twelve) hours. 60 tablet 3  . potassium chloride SA (K-DUR,KLOR-CON) 20 MEQ tablet Take 1 tablet (20 mEq total) by mouth 3 (three) times daily. 90 tablet 3  . promethazine (PHENERGAN) 25 MG tablet TAKE 1 TABLET BY MOUTH EVERY SIX HOURS AS NEEDED FOR NAUSEA OR VOMITING 30 tablet 6  . XARELTO 20 MG TABS tablet TAKE 1 TABLET BY MOUTH EVERY DAY WITH SUPPER 30 tablet 3  . XELODA 500 MG tablet Take 1500 mg PO BID, 7 days on and 7 days off 84 tablet 1  . Zoledronic Acid (ZOMETA) 4 MG/100ML IVPB Inject 100 mLs (4 mg total) into the vein every 30 (thirty) days. 100 mL 11  . zolpidem (AMBIEN) 10 MG tablet TAKE 1 TABLET BY MOUTH AT BEDTIME AS NEEDED 30 tablet 3   No current facility-administered medications for this visit.     Neurologic: Headache: Yes Seizure: Negative Paresthesias:Negative  Musculoskeletal: Strength & Muscle Tone: within normal limits Gait & Station: normal Patient leans: N/A  Psychiatric Specialty Exam: Review of Systems  Constitutional: Negative.   HENT: Negative.   Respiratory: Negative.   Cardiovascular: Negative.   Gastrointestinal: Negative.   Musculoskeletal: Negative.   Neurological: Negative.   Psychiatric/Behavioral: Positive for depression. The patient is nervous/anxious and has insomnia.     Blood pressure 128/78, pulse  89, height _0  (1.727 m), weight 256 lb (116.1 kg).Body mass index is 38.92 kg/m.  General Appearance: Casual and Fairly Groomed  Eye Contact:  Fair  Speech:  Clear and Coherent and Normal Rate  Volume:  Normal  Mood:  Depressed and Dysphoric  Affect:  Congruent  Thought Process:  Goal Directed  Orientation:  Full (Time, Place, and Person)  Thought Content:  Logical  Suicidal Thoughts:  No  Homicidal Thoughts:  No  Memory:  Immediate;   Fair  Judgement:  Fair  Insight:  Fair  Psychomotor Activity:  Normal  Concentration:  Attention Span: Fair  Recall:  [employer] of Knowledge:Fair  Language: Fair  Akathisia:  Negative  Handed:  Right  AIMS (if indicated):  0  Assets:  Communication Skills Desire for Improvement Financial Resources/Insurance Housing Resilience Social Support Transportation  ADL's:  Intact  Cognition: WNL  Sleep:  4-5 hours, poor rest    Treatment Plan Summary: Curtis Uriarte Durfey is a 51 year old female with a psychiatric history of major depressive disorder pre-existing before she was diagnosed with stage IV metastatic breast cancer approximately 3 years ago. She has contended with brain metastases status post radiation. Most recent MRI in June 2018 did not reveal any new lesions. She has been tried on multiple psychiatric medications for relief of her depressive symptoms, and continues to struggle with severe depression. She desires a rapid improvement in her depressive symptoms, so that she can enjoy as high of a quality of life as possible. I spent time discussing my recommendations that we proceed with an ECT consultation, and a polysomnography to investigate for any underlying sleep apnea. She has a family history of sleep  apnea and bipolar disorder.  She is agreeable to the recommendations and to proceed as below. We will follow up in 10-12 weeks.  1. Sleep-disordered breathing   2. Severe episode of recurrent major depressive disorder, without psychotic  features (H[facility])    Continue Pristiq 100 mg Continue Ambien 10 mg at night Continue lorazepam 0.5 mg every 6 hours as needed for anxiety or nausea  Continue methylphenidate 20 mg in the morning and 20 mg at noon for cognitive symptoms related to prior metastasis and brain radiation, chemotherapy Referral for ECT consultation Referral for polysomnography Return to clinic in 10 weeks  I spent 50 minutes with the patient in direct care and counseling. We discussed the trajectory of her social and family history, the onset of her cancer symptoms, her struggle with mood symptoms over the past tend to 15 years, issues of treatment resistance, and recommendations moving forward.      Aundra Dubin, MD 10/10/20183:20 PM

## 2017-02-17 NOTE — Patient Instructions (Signed)
Please call the sleep clinic at [phone] to schedule sleep study   Call Dr. Weber Cooks to schedule an ECT consult

## 2017-02-18 ENCOUNTER — Encounter (HOSPITAL_COMMUNITY): Payer: Self-pay | Admitting: Adult Health

## 2017-02-18 ENCOUNTER — Telehealth (HOSPITAL_COMMUNITY): Payer: Self-pay | Admitting: Adult Health

## 2017-02-18 ENCOUNTER — Other Ambulatory Visit (HOSPITAL_COMMUNITY): Payer: Self-pay | Admitting: Adult Health

## 2017-02-18 ENCOUNTER — Encounter (HOSPITAL_BASED_OUTPATIENT_CLINIC_OR_DEPARTMENT_OTHER): Payer: Medicare Other | Admitting: Adult Health

## 2017-02-18 VITALS — BP 120/52 | HR 72 | Resp 16 | Ht 68.0 in | Wt 256.0 lb

## 2017-02-18 DIAGNOSIS — R609 Edema, unspecified: Secondary | ICD-10-CM

## 2017-02-18 DIAGNOSIS — C50919 Malignant neoplasm of unspecified site of unspecified female breast: Secondary | ICD-10-CM

## 2017-02-18 DIAGNOSIS — K219 Gastro-esophageal reflux disease without esophagitis: Secondary | ICD-10-CM

## 2017-02-18 DIAGNOSIS — C787 Secondary malignant neoplasm of liver and intrahepatic bile duct: Secondary | ICD-10-CM | POA: Diagnosis not present

## 2017-02-18 DIAGNOSIS — C229 Malignant neoplasm of liver, not specified as primary or secondary: Secondary | ICD-10-CM

## 2017-02-18 DIAGNOSIS — G4700 Insomnia, unspecified: Secondary | ICD-10-CM | POA: Diagnosis not present

## 2017-02-18 DIAGNOSIS — C7931 Secondary malignant neoplasm of brain: Secondary | ICD-10-CM

## 2017-02-18 DIAGNOSIS — C7951 Secondary malignant neoplasm of bone: Secondary | ICD-10-CM | POA: Diagnosis not present

## 2017-02-18 DIAGNOSIS — F418 Other specified anxiety disorders: Secondary | ICD-10-CM

## 2017-02-18 DIAGNOSIS — G893 Neoplasm related pain (acute) (chronic): Secondary | ICD-10-CM | POA: Diagnosis not present

## 2017-02-18 DIAGNOSIS — E876 Hypokalemia: Secondary | ICD-10-CM

## 2017-02-18 DIAGNOSIS — R530 Neoplastic (malignant) related fatigue: Secondary | ICD-10-CM

## 2017-02-18 DIAGNOSIS — R600 Localized edema: Secondary | ICD-10-CM

## 2017-02-18 DIAGNOSIS — G620 Drug-induced polyneuropathy: Secondary | ICD-10-CM

## 2017-02-18 MED ORDER — ZOLPIDEM TARTRATE 10 MG PO TABS: 10.0000 mg | ORAL_TABLET | Freq: Every evening | ORAL | 3 refills | Status: DC | PRN

## 2017-02-18 MED ORDER — SPIRONOLACTONE 25 MG PO TABS: 25.0000 mg | ORAL_TABLET | Freq: Every day | ORAL | 3 refills | Status: DC

## 2017-02-18 MED ORDER — PANTOPRAZOLE SODIUM 40 MG PO TBEC: 40.0000 mg | DELAYED_RELEASE_TABLET | Freq: Two times a day (BID) | ORAL | 3 refills | Status: DC

## 2017-02-18 MED ORDER — RIVAROXABAN 20 MG PO TABS: ORAL_TABLET | ORAL | 3 refills | Status: DC

## 2017-02-18 MED ORDER — GABAPENTIN 300 MG PO CAPS: 600.0000 mg | ORAL_CAPSULE | Freq: Every day | ORAL | 5 refills | Status: DC

## 2017-02-18 NOTE — Telephone Encounter (Signed)
REQUEST [place]   DOS [phone]

## 2017-02-18 NOTE — Progress Notes (Signed)
[name], [place] [id number]   CLINIC:  Medical Oncology/Hematology  PCP:  [name], MD [place] [address] [phone]   REASON FOR VISIT:  Follow-up for Stage IV metastatic breast adenocarcinoma with liver, bone, brain, and spine mets; ER+/HER2+   CURRENT THERAPY: Xeloda 1500 mg AM and 1500 mg PM, 7 days on/7 days off schedule with Tykerb daily & Zometa monthly   BRIEF ONCOLOGIC HISTORY:    Breast cancer, stage 4 ([facility])   04/25/2014 Initial Diagnosis    Breast cancer, stage 4      04/25/2014 Imaging    CT abdomen/pelvis with widespread metastatic disease to the liver, multiple lytic lesions throughout spine and pelvis. No FX or epidural tumor identified      04/26/2014 Imaging    CT head unremarkable      04/26/2014 Imaging    CT chest with no lung mass or pulmonary nodules, no adenopathy. Lytic bone lesions, right 2nd rib      04/27/2014 Initial Biopsy    U/S guided liver biopsy, lesion in anterior and inferior left hepatic lobe biopsied      04/27/2014 Pathology Results    Metastatic adenocarcinoma, CK7, ER+, patchy positivity with PR. Possible primary includes breast, less likely gynecologic      05/15/2014 Mammogram    BI-RADS CATEGORY  2: Benign Finding(s)      05/16/2014 PET scan    1. Intensely hypermetabolic hepatic metastasis. 2. Widespread hypermetabolic skeletal lesions. 3. No primary adenocarcinoma identified by FDG PET imaging.      05/19/2014 Imaging    MUGA- Left ventricular ejection fracture greater than 70%.      05/21/2014 Breast MRI    No suspicious masses or enhancement within the breasts. No axillary adenopathy.      05/22/2014 - 07/03/2014 Antibody Plan    Herceptin/Perjeta/Tamoxifen      06/12/2014 - 07/03/2014 Chemotherapy    Taxotere added secondary to persistent abdominal and back pain      06/17/2014 - 06/19/2014 Hospital Admission    Neutropenia, fever, diarrhea, nausea,  vomiting      06/20/2014 - 07/10/2014 Radiation Therapy    Dr. Thea Silversmith 12 fractions to L3-S3 (30 Gy) and left scapula (20 Gy).       07/03/2014 Adverse Reaction    Perjeta- induced diarrhea.  Perjeta discontinued      07/16/2014 - 07/20/2014 Hospital Admission    Electrolyte abnormalities, and diarrhea.  Suspect Perjeta-induced diarrhea.  Negative GI work-up.      07/24/2014 - 08/19/2015 Chemotherapy    Herceptin/Tamoxifen/Xgeva      08/21/2014 Imaging    MUGA- Left ventricular ejection fraction equals 71%.      08/24/2014 PET scan    Dramatic reduction in metabolic activity of the widespread liver metastasis. Liver metastasis now have metabolic activity equal to background normal liver activity. Liver has a nodular contour. Marked reduction in metabolic activity of skeletal lesions..      10/05/2014 Progression    Widespread metastatic disease to the brain as described. Between 20 and 30 intracranial metastatic deposits are now seen. No midline shift or incipient herniation      10/09/2014 - 10/26/2014 Radiation Therapy    Whole Brain XRT      11/14/2014 Imaging    MUGA- LVEF 67%      02/13/2015 Imaging    MUGA- LVEF 59%      02/15/2015 Treatment Plan Change    Due  to declining LVEF, will hold Herceptin per PI guidelines.      04/12/2015 -  Chemotherapy    Herceptin restarted      06/02/2015 - 06/08/2015 Hospital Admission    Pneumonia      07/05/2015 Progression     PET/CT concern for mild progression of skeletal metastasis with several lesions within the spine and 1 lesion in the Left iliac wing with mild to moderate metabolic activity new from prior. Rising CA 27-29      07/16/2015 - 10/23/2015 Anti-estrogen oral therapy    Arimidex      07/16/2015 Imaging    MRI brain with satisfactory post treatment apperance of brain. interval resolved enhancing R caudate metastasis, minimal punctate residual enhancing metastatic disease at the inferior L cerebellum. No new  metastatic disease or new intracranial abnormality      07/19/2015 Treatment Plan Change    Discontinue Tamoxifen, Zoladex plus Arimidex.       08/27/2015 Procedure    Laparoscopic bilateral salpingo-oophorectomy by Dr. Gaetano Net      10/17/2015 PET scan    Osseous metastatic disease appears slightly progressive based on a new right scapular lesion and increased uptake within lesions in the thoracic spine, left iliac wing and  proximal right femur.      10/17/2015 Progression    Slight progression on PET scan imaging.      10/18/2015 Imaging    REsolved enhancing metastatic disease to the brain status post WBXRT      10/23/2015 - 02/12/2016 Adjuvant Chemotherapy    Faslodex loading followed by maintenance dose.  (Herceptin continued)      11/04/2015 Imaging    MUGA- LEFT ventricular ejection fraction 51% slightly decreased in a 57% on the previous exam.      12/24/2015 Treatment Plan Change    Zometa every 28 days.  Xgeva discontinued.        01/01/2016 Imaging    MUGA- Left ventricular ejection fraction equals 57.9%. This is increased from 51.1% previously.      02/03/2016 PET scan    1. Mixed metabolic changes in the scattered hypermetabolic sclerotic osseous metastases throughout the axial and proximal appendicular skeleton as detailed above. 2. No new sites of hypermetabolic metastatic disease. Stable pseudo-cirrhotic appearance of the liver due to treated liver metastases with no hypermetabolic liver metastases.       02/03/2016 Progression    PET shows mixed osseous response.  Some lesions more hypermetabolic, others improved.      02/05/2016 Treatment Plan Change    D/C Herceptin.  Faslodex as scheduled on 02/12/2016, then discontinued.  Continue Zometa.      02/05/2016 Treatment Plan Change    Prescriptions for Xeloda 7 days on and 7 days off and Tykerb printed and provided for authorization.      02/19/2016 -  Chemotherapy    Xeloda 2300 mg BID 7 days on and 7 days  off and Tykerb       02/24/2016 Treatment Plan Change    Xeloda dose reduced by 10% to 2000 mg BID week on and week off.      03/18/2016 Treatment Plan Change    Xeloda dose reduced to 2000 mg in AM and 1500 mg in PM 7 days on and 7 days off.      03/23/2016 Imaging    MUGA- Normal LEFT ventricular ejection fraction of 56% not significantly changed from 58% on previous exam.      05/19/2016 Imaging    MRI brain- .  No new focus of abnormal enhancement to suggest interval metastatic disease. 2. No acute intracranial abnormality. 3. Stable foci of T2 FLAIR hyperintensity in white matter and in the right caudate head compatible with posttreatment changes and treated metastasis.      05/20/2016 PET scan    1. The multiple osseous metastatic lesions shown to be hypermetabolic on the prior exam are reduced in activity or even resolved in hypermetabolic activity compared to prior, as detailed above. There are also numerous sclerotic bony lesions wedge are not currently and were not previously hypermetabolic, compatible with old non active lesions. 2. No findings of extra osseous metastatic disease currently. 3. Hepatic pseudocirrhosis. 4. Healing rib fractures. Chronic pathologic fracture the left acromion.      07/08/2016 Imaging    MUGA- Low normal LEFT ventricular ejection fraction of 53%, little changed since the 56% on 03/23/2016 but slightly decreased from the 58% on 01/01/2016.      09/07/2016 Echocardiogram    MUGA- Normal LEFT ventricular ejection fraction of 59% with normal LV wall motion.      09/07/2016 Treatment Plan Change    Xeloda dose reduced to 1500 mg BID 7 days on and 7 days off.      09/15/2016 PET scan    No significant change in diffuse sclerotic bone metastases on CT images, although several show mildly increased FDG uptake since prior study.  No evidence of soft tissue metastatic disease.      11/02/2016 Imaging    MRI brain- No change from the  prior study. Previously noted metastatic deposits have been treated. No new lesions identified.        INTERVAL HISTORY:  Ms. Misenheimer 51 y.o. female presents for continued follow-up for metastatic breast cancer.   She called our office yesterday and asked to be seen today.    Chart reviewed.  Recently saw Dr. Daron Offer with [facility], who is recommending ECT.  He has also referred her for sleep study given her sleep disordered breathing.  Dr. Daron Offer recommended continuing Pristiq 100 mg daily, Ritalin 20 mg BID, Ambien 10 mg at bedtime, and Ativan 0.5 mg PRN; no psychiatric medication changes recommended.   She has been reading about the ECT and talked about it with her husband. They received patient education yesterday at their psychiatry consultation about ECT. "We just want to be sure that you're aware of what Dr. Daron Offer is planning and that it is okay from a cancer standpoint."    Otherwise, she has been feeling pretty well. Appetite 75%; energy levels 25%. Denies any pain. Endorses occasional constipation, diarrhea, and nausea.    She is requesting refills on several of her long-term medications today including: Lasix, Xarelto, Gabapentin, Protonix, and Ambien.    Remains on Xeloda and Tykerb as directed.   Her husband shares concerns that "everything is getting kicked back from Medicare" in terms of her medications and scans.  He states that [employer] needs a letter of medical necessity for all future procedures/scans/medications. [name], our patient advocate, is aware of this request per pt's husband.     REVIEW OF SYSTEMS:  Review of Systems - Oncology, per HPI. Otherwise, 12-point ROS completed and negative.      PAST MEDICAL/SURGICAL HISTORY:  Past Medical History:  Diagnosis Date  . Anticoagulated    xarelto  . Anxiety   . Breast cancer metastasized to multiple sites [facility])    liver, brain, and bone  . Breast cancer, stage 4 [facility]) oncologist-  dr  Larene Beach [place] ([facility])   dx 12/ 2015 -- breast cancer Stage 4,  ER/HER2 +,  w/  liver, brain and  bone mets/  chemotherapy and radiation therapy  . Chronic pain syndrome    secondary to cancer   . Depression   . Drug-induced cardiomyopathy ([facility])    per last MUGA (08-08-2015), ef 56.5/ per last echo 05-18-2014 ef 60-65%  . Family history of prostate cancer   . GERD (gastroesophageal reflux disease)   . History of colon polyps    07-13-2013  benign  . History of DVT (deep vein thrombosis)    07-09-2014  upper right extremity-  RIJ and right subclavian--  resolved  . History of gastritis    erosive  . History of pneumonia    HCAP 06-07-2015--  resolved per cxr 07-04-2015  . History of radiation therapy    12 fractions to L3 - S3, 30Gy and left spacula 20Gy (06-20-2014 to 07-10-2014) //  whole brain rxt (10-09-2014 to 10-26-2014)  . History of small bowel obstruction    S/P RESECTION 2008  . Migraine   . PONV (postoperative nausea and vomiting)    pt states scope patch does well   Past Surgical History:  Procedure Laterality Date  . BREAST REDUCTION SURGERY  03/17/2011   Procedure: MAMMARY REDUCTION BILATERAL (BREAST);  Surgeon: Mary A Contogiannis;  Location: [place];  Service: Plastics;  Laterality: Bilateral;  . CATARACT EXTRACTION W/ INTRAOCULAR LENS  IMPLANT, BILATERAL  2008  . CERVICAL FUSION  2003   C5 -- C6  . COLONOSCOPY N/A 07/13/2013   Procedure: COLONOSCOPY;  Surgeon: Rogene Houston, MD;  [facility];  Service: Endoscopy;  Laterality: N/A;  930  . COLONOSCOPY N/A 11/26/2014   Procedure: COLONOSCOPY;  Surgeon: Rogene Houston, MD;  [facility];  Service: Endoscopy;  Laterality: N/A;  730  . D & C HYSTEROSCOPY/  RESECTION ENDOMETRIAL MASS/  [place] ENDOMETRIAL ABLATION  04-11-2010  . DX LAPAROSCOPY W/ PARTIAL SMALL BOWEL RESECTION AND APPENDECTOMY  04-13-2007  . ESOPHAGOGASTRODUODENOSCOPY N/A 05/25/2014   Procedure:  ESOPHAGOGASTRODUODENOSCOPY (EGD);  Surgeon: Rogene Houston, MD;  [facility];  Service: Endoscopy;  Laterality: N/A;  155  . KNEE ARTHROSCOPY Right 2005  . LAPAROSCOPIC ASSISTED VAGINAL HYSTERECTOMY  10-13-2010   w/ Bx Left Fallopian tube and Aspiration Right Ovarian Cyst  . LAPAROSCOPIC CHOLECYSTECTOMY  11-17-2002  . LAPAROSCOPIC SALPINGO OOPHERECTOMY Bilateral 08/26/2015   Procedure: LAPAROSCOPIC SALPINGO OOPHORECTOMY, bilateral;  Surgeon: Everlene Farrier, MD;  Location: [facility];  Service: Gynecology;  Laterality: Bilateral;  . PORTACATH PLACEMENT  05-17-2014  . REDUCTION MAMMAPLASTY    . SHOULDER ARTHROSCOPY WITH ROTATOR CUFF REPAIR Right 2002  . TRANSTHORACIC ECHOCARDIOGRAM  05-18-2014   ef 60-65%//   last MUGA  (08-08-2015)  ef 56.6%       SOCIAL HISTORY:  Social History   Social History  . Marital status: Married    Spouse name: N/A  . Number of children: N/A  . Years of education: N/A   Occupational History  . Not on file.   Social History Main Topics  . Smoking status: Former Smoker    Types: Cigarettes    Quit date: 08/20/1994  . Smokeless tobacco: Never Used  . Alcohol use Yes     Comment: Occasionally  . Drug use: No  . Sexual activity: Not on file   Other Topics Concern  . Not on file  Social History Narrative  . No narrative on file    FAMILY HISTORY:  Family History  Problem Relation Age of Onset  . Diabetes Father   . Heart attack Maternal Grandmother 30       multiple over lifetime.  . Cancer Maternal Grandmother 70       NOS  . Prostate cancer Maternal Grandfather        dx in his 75s  . Lung cancer Paternal Grandfather        dx <50  . Lymphoma Maternal Aunt        dx in her 16s  . Melanoma Cousin 24       maternal first cousin  . Brain cancer Cousin        paternal first cousin dx under 30  . Prostate cancer Other        MGF's father  . Colon cancer Other        MGM's mother    CURRENT MEDICATIONS:    Outpatient Encounter Prescriptions as of 02/18/2017  Medication Sig  . acetaminophen (TYLENOL) 325 MG tablet Take 650 mg by mouth every 6 (six) hours as needed for moderate pain or headache.  . calcium carbonate (TUMS - DOSED IN MG ELEMENTAL CALCIUM) 500 MG chewable tablet Chew 1 tablet by mouth daily.  . Calcium-Phosphorus-Vitamin D (CALCIUM/D3 ADULT GUMMIES PO) Take 2 tablets by mouth 2 (two) times daily. dosage of calcium '1200mg'$  and vitamin d '1000mg'$   . desvenlafaxine (PRISTIQ) 100 MG 24 hr tablet Take 1 tablet (100 mg total) by mouth daily.  . diphenhydrAMINE (BENADRYL) 25 mg capsule Take 25 mg by mouth every 6 (six) hours as needed for itching.  . gabapentin (NEURONTIN) 300 MG capsule Take 2 capsules (600 mg total) by mouth at bedtime.  . lapatinib (TYKERB) 250 MG tablet Take 5 tablets (1,250 mg total) by mouth daily.  Marland Kitchen lidocaine-prilocaine (EMLA) cream Apply 1 application topically daily as needed (apply to port before chemo).  . LORazepam (ATIVAN) 0.5 MG tablet Take 1 tablet (0.5 mg total) by mouth every 6 (six) hours as needed for anxiety.  . magic mouthwash SOLN Swish and swallow 59m four times a day.  . Melatonin 10 MG CAPS Take 1 tablet by mouth. Pt states she takes one tablet at night for sleep  . methylphenidate (RITALIN) 20 MG tablet Take 1 tablet (20 mg total) by mouth 2 (two) times daily. Take 1 tab in morning and 1 tab at lunch/before 1pm. (Patient taking differently: Take 20 mg by mouth daily. Take 1 tab in morning and 1 tab at lunch/before 1pm.)  . morphine (MS CONTIN) 30 MG 12 hr tablet Take 1 tablet (30 mg total) by mouth every 12 (twelve) hours.  . ondansetron (ZOFRAN) 8 MG tablet Take 1 tablet (8 mg total) by mouth every 8 (eight) hours as needed for nausea or vomiting.  .Marland KitchenoxyCODONE (OXY IR/ROXICODONE) 5 MG immediate release tablet Take 1-2 tablets (5-10 mg total) by mouth every 4 (four) hours as needed for moderate pain.  . pantoprazole (PROTONIX) 40 MG tablet Take 1 tablet  (40 mg total) by mouth every 12 (twelve) hours.  . potassium chloride SA (K-DUR,KLOR-CON) 20 MEQ tablet Take 1 tablet (20 mEq total) by mouth 3 (three) times daily.  . promethazine (PHENERGAN) 25 MG tablet TAKE 1 TABLET BY MOUTH EVERY SIX HOURS AS NEEDED FOR NAUSEA OR VOMITING  . rivaroxaban (XARELTO) 20 MG TABS tablet TAKE 1 TABLET BY MOUTH EVERY DAY WITH SUPPER  .  XELODA 500 MG tablet Take 1500 mg PO BID, 7 days on and 7 days off  . Zoledronic Acid (ZOMETA) 4 MG/100ML IVPB Inject 100 mLs (4 mg total) into the vein every 30 (thirty) days.  Marland Kitchen zolpidem (AMBIEN) 10 MG tablet Take 1 tablet (10 mg total) by mouth at bedtime as needed.  . [DISCONTINUED] furosemide (LASIX) 20 MG tablet TAKE 1 TABLET BY MOUTH TWICE DAILY (Patient taking differently: taking once daily)  . [DISCONTINUED] gabapentin (NEURONTIN) 300 MG capsule TAKE TWO CAPSULES BY MOUTH AT BEDTIME  . [DISCONTINUED] pantoprazole (PROTONIX) 40 MG tablet Take 1 tablet (40 mg total) by mouth every 12 (twelve) hours.  . [DISCONTINUED] XARELTO 20 MG TABS tablet TAKE 1 TABLET BY MOUTH EVERY DAY WITH SUPPER  . [DISCONTINUED] zolpidem (AMBIEN) 10 MG tablet TAKE 1 TABLET BY MOUTH AT BEDTIME AS NEEDED  . spironolactone (ALDACTONE) 25 MG tablet Take 1 tablet (25 mg total) by mouth daily.   No facility-administered encounter medications on file as of 02/18/2017.     ALLERGIES:  No Known Allergies   PHYSICAL EXAM:  ECOG Performance status: 1 - Symptomatic, but independent.   Vitals:   02/18/17 0843  BP: (!) 120/52  Pulse: 72  Resp: 16  SpO2: 97%   Filed Weights   02/18/17 0843  Weight: 256 lb (116.1 kg)     Physical Exam  Constitutional: She is oriented to person, place, and time and well-developed, well-nourished, and in no distress.  HENT:  Head: Normocephalic.  Mouth/Throat: Oropharynx is clear and moist. No oropharyngeal exudate.  Eyes: Pupils are equal, round, and reactive to light. Conjunctivae are normal. No scleral icterus.    Neck: Normal range of motion. Neck supple.  Cardiovascular: Normal rate and regular rhythm.   Pulmonary/Chest: Effort normal and breath sounds normal. No respiratory distress. She has no wheezes.  Abdominal: Soft. Bowel sounds are normal. There is no tenderness.  Musculoskeletal: Normal range of motion. She exhibits no edema.  Lymphadenopathy:    She has no cervical adenopathy.       Right: No supraclavicular adenopathy present.       Left: No supraclavicular adenopathy present.  Neurological: She is alert and oriented to person, place, and time. No cranial nerve deficit. Gait normal.  Skin: Skin is warm and dry. No rash noted.  Psychiatric: Mood, memory, affect and judgment normal.  Nursing note and vitals reviewed.    LABORATORY DATA:  I have reviewed the labs as listed.  CBC    Component Value Date/Time   WBC 4.6 02/02/2017 1048   RBC 3.99 02/02/2017 1048   HGB 11.7 (L) 02/02/2017 1048   HCT 36.2 02/02/2017 1048   PLT 229 02/02/2017 1048   MCV 90.7 02/02/2017 1048   MCH 29.3 02/02/2017 1048   MCHC 32.3 02/02/2017 1048   RDW 18.0 (H) 02/02/2017 1048   LYMPHSABS 0.9 02/02/2017 1048   MONOABS 0.4 02/02/2017 1048   EOSABS 0.0 02/02/2017 1048   BASOSABS 0.0 02/02/2017 1048   CMP Latest Ref Rng & Units 02/12/2017 02/02/2017 12/24/2016  Glucose 65 - 99 mg/dL - 136(H) 137(H)  BUN 6 - 20 mg/dL - 8 10  Creatinine 0.44 - 1.00 mg/dL - 0.80 0.89  Sodium 135 - 145 mmol/L - 138 138  Potassium 3.5 - 5.1 mmol/L 3.2(L) 3.2(L) 3.1(L)  Chloride 101 - 111 mmol/L - 99(L) 99(L)  CO2 22 - 32 mmol/L - 30 29  Calcium 8.9 - 10.3 mg/dL - 9.3 9.2  Total Protein 6.5 -  8.1 g/dL - 7.4 7.0  Total Bilirubin 0.3 - 1.2 mg/dL - 0.3 0.6  Alkaline Phos 38 - 126 U/L - 94 102  AST 15 - 41 U/L - 27 21  ALT 14 - 54 U/L - 19 15         PENDING LABS:     DIAGNOSTIC IMAGING:  Most recent PET scan: 01/20/17 CLINICAL DATA:  Subsequent treatment strategy for breast cancer.  EXAM: NUCLEAR MEDICINE  PET SKULL BASE TO THIGH  TECHNIQUE: 12.4 mCi F-18 FDG was injected intravenously. Full-ring PET imaging was performed from the skull base to thigh after the radiotracer. CT data was obtained and used for attenuation correction and anatomic localization.  FASTING BLOOD GLUCOSE:  Value: 114 mg/dl  COMPARISON:  09/16/2026  FINDINGS: NECK: No hypermetabolic lymph nodes in the neck.  CHEST: No hypermetabolic mediastinal or hilar nodes. No suspicious pulmonary nodules on the CT scan. Right Port-A-Cath tip is in the distal SVC.  ABDOMEN/PELVIS: No abnormal hypermetabolic activity within the liver, pancreas, adrenal glands, or spleen. No hypermetabolic lymph nodes in the abdomen or pelvis.  SKELETON: Multiple hypermetabolic bone metastases are again noted. Some of the show stable FDG accumulation (right glenoid lesion with SUV max = 12.3 today compared to 12.4 previously). Some of the bone lesions have improved in the interval (sclerotic T7 lesion with SUV max = 5.7 today compared 11.8 previously). Some of the bone lesions show interval increase in hypermetabolism (left iliac crest lesion with SUV max = 16.9 today compared to 12.8 Previously) and the previously described progressive right hip lesion with uptake in the right femoral neck increasing to an SUV max = 16.2 today from 11.3 previously . Some of the hypermetabolic bone lesions are new in the interval (left T8 transverse process lesion with SUV max = 5.7) Some of the sclerotic bone lesion show no hypermetabolism on either study (L4 lesion).  IMPRESSION: 1. No evidence for hypermetabolic soft tissue metastases. 2. No marked change in hypermetabolic bone metastases with some variability in bony lesion response as some lesions appear stable while others have either progressed or decreased.   Electronically Signed   By: Misty Stanley M.D.   On: 01/20/2017 14:34    Most recent MUGA scan: 01/18/17 CLINICAL DATA:   Stage IV breast cancer with osseous metastases, cardiotoxic chemotherapy  EXAM: NUCLEAR MEDICINE CARDIAC BLOOD POOL IMAGING (MUGA)  TECHNIQUE: Cardiac multi-gated acquisition was performed at rest following intravenous injection of Tc-11mlabeled red blood cells.  RADIOPHARMACEUTICALS:  22 mCi [id number]-vitro labeled autologous red blood cells IV  COMPARISON:  09/07/2016  FINDINGS: Calculated LEFT ventricular ejection fraction is 61%, within the normal range.  This is not significantly changed from the 59% on the previous exam.  Wall motion analysis of the LEFT ventricle in 3 projections is normal.  IMPRESSION: Normal LEFT ventricular ejection fraction of 61% little changed from the 59% on the previous exam.  Normal LV wall motion.   Electronically Signed   By: MarLavonia DanaD.   On: 01/18/2017 15:26     MRI brain: 11/02/16 CLINICAL DATA:  Metastatic breast cancer. Whole-brain radiation and chemotherapy  EXAM: MRI HEAD WITHOUT AND WITH CONTRAST  TECHNIQUE: Multiplanar, multiecho pulse sequences of the brain and surrounding structures were obtained without and with intravenous contrast.  CONTRAST:  35m30mLTIHANCE GADOBENATE DIMEGLUMINE 529 MG/ML IV SOLN  COMPARISON:  MRI 05/19/2016  FINDINGS: Brain: Negative for enhancing metastatic deposits in the brain. Stable 3 mm T2 hyperintensity in the  head of the caudate on the right is unchanged compatible with treated tumor. Other metastatic deposit is no longer enhance. No new lesions identified.  Ventricle size normal. Negative for acute infarct. Mild chronic white matter changes stable. Negative for hemorrhage or mass.  Vascular: Normal arterial flow void  Skull and upper cervical spine: Negative  Sinuses/Orbits: Negative  Other: None  IMPRESSION: No change from the prior study. Previously noted metastatic deposits have been treated. No new lesions  identified.   Electronically Signed   By: Franchot Gallo M.D.   On: 11/02/2016 17:02     PATHOLOGY:  [employer]: 05/25/14          ASSESSMENT & PLAN:   Stage IV metastatic breast adenocarcinoma with liver, bone, brain, and spine mets; ER+/HER2+: -Currently taking Xeloda 1500 mg BID 7 days on and 7 days off with Tykerb daily.  -Tumor markers (CA 27-29 & CA 15-3) have been slowly increasing over time.  Most recent PET scan results reviewed in detail with patient and her husband today. We reviewed the images together as well.  Overall, disease is stable with mixed response (some areas of increased hypermetabolism/growth, and other areas of response).    -Given continuing increase in tumor markers, discussed obtaining short-interval PET scan in 03/2017 to assess for progressive disease; orders placed today.    -Last MUGA scan 01/18/17 normal with EF 61%.  She will be due for next cardiac evaluation given potential cardiotoxicity of Tykerb therapy in 04/2017; will place orders at subsequent visits.  -Continue current treatment with Xeloda and Tykerb. -Return to cancer center in ~2 weeks as previously scheduled.   Depression/Anxiety:  -Continue current medication regimen per psychiatry with Pristiq daily and Ativan PRN.  -Dr. Daron Offer with psychiatry is planning ECT for patient, three times week for ~4 weeks.   -Literature briefly reviewed and I also discussed with Dr. Talbert Cage. We are not aware of any contraindications with ECT and concurrent oral chemotherapy in a patient with previous whole brain radiation therapy.  Of course, data is limited as cancer patients in this setting are not often studied.  However, I did reach out to a colleague at [facility]; they too have little experience in this realm for cancer patients previously treated with whole brain XRT.  She does not have a history of seizures and is not currently on any anti-epileptic medications (which was the brain specialist  at [facility] main concern).    -We discussed that the benefits may outweigh any potential (yet unknown) risks given her decreased quality of life secondary to major depression and associated anxiety.   -We support psychiatry's recommendations and patient's wishes to proceed with ECT.  We will be available to help monitor any adverse effects, as it may relate to her chemotherapy and metastatic cancer.  We appreciate psychiatry's input and their recommendations in helping this special lady.    Fatigue/Sleep disturbance:  -Sleep study pending at [facility] (ordered by psychiatry).  -Continue Ambien at bedtime; new prescription provided today.   Hypokalemia/Chronic LE edema:  -Likely secondary to Lasix, which she is taking once daily.   -She is requesting refill of Lasix today. Discussed switching her Lasix to Aldactone 25 mg daily to help spare potassium. She agreed; Rx e-scribed to her pharmacy.   -Encouraged her to continue her oral potassium supplements, but only once per day (instead of TID).  Will recheck CMET in 2 weeks when she returns for follow-up visit. If potassium is normal at that time, then  may be able to stop potassium supplementation altogether.    GERD/Gastritis symptoms:  -Protonix BID has been helpful for her. Rx e-scribed to her pharmacy today.    Bone mets:  -Continue monthly Zometa. Due for next dose in ~2 weeks when she returns to cancer center.    Peripheral neuropathy:  -Gabapentin helpful. Refill e-scribed to her pharmacy today.   Cancer-related fatigue:  -Continue Ritalin 20 mg BID, although she historically only takes once per day.  She generally does not take the medication BID, but it is helpful with first dose in the mornings.    In the future, I would be reluctant to further increase the dose given tachycardia and possibility of cardiotoxicity with Tykerb. -Psychiatry recommends continuing Ritalin 20 mg BID; no refills needed today per patient.   Chronic pain  secondary to neoplasm:  -MS Contin effective in managing her pain.  Maintain current dose of MS Contin for basal pain control with oxycodone PRN.  -No reported concerns about constipation; she understands the importance of adequate bowel regimen with opiates.   Macular edema:  -Continue follow-up with ophthalmology as directed.       Dispo:  -Return to cancer center in ~2 weeks for follow-up and next Zometa infusion.  -PET scan sometime in ~1 months; orders placed today.    All questions were answered to patient's stated satisfaction. Encouraged patient to call with any new concerns or questions before her next visit to the cancer center and we can certain see her sooner, if needed.    Plan of care discussed with Dr. Talbert Cage, who agrees with the above aforementioned.    Orders placed this encounter:  Orders Placed This Encounter  Procedures  . NM PET Image Restag (PS) Skull Base To Thigh      Mike Craze, NP [place] [phone]

## 2017-02-18 NOTE — Progress Notes (Signed)
     02/18/17   To Whom It May Concern:   Please allow this documentation to serve as a letter of medical necessity for [name] (DOB: 09-06-1965) for her recent MUGA scan completed on 01/18/17.  [name] has metastatic breast cancer, currently receiving treatment with oral chemotherapeutic agents of Tykerb and Xeloda.  Tykerb is a potentially cardiotoxic drug and requires periodic cardiac function evaluation.  She also has a history of receiving other biotherapy/chemotherapy medications that can cause late cardiotoxicity, again requiring periodic heart evaluation.  Please feel free to contact our cancer center with any additional questions or concerns regarding this patient and her current plan of care.      Best regards,      Mike Craze, NP [place] [phone]

## 2017-02-23 ENCOUNTER — Ambulatory Visit (HOSPITAL_BASED_OUTPATIENT_CLINIC_OR_DEPARTMENT_OTHER): Payer: Medicare Other | Attending: Psychiatry | Admitting: Internal Medicine

## 2017-02-23 VITALS — Ht 68.0 in | Wt 255.0 lb

## 2017-02-23 DIAGNOSIS — G4731 Primary central sleep apnea: Secondary | ICD-10-CM | POA: Diagnosis not present

## 2017-02-23 DIAGNOSIS — G4739 Other sleep apnea: Secondary | ICD-10-CM

## 2017-02-23 DIAGNOSIS — G4730 Sleep apnea, unspecified: Secondary | ICD-10-CM | POA: Diagnosis present

## 2017-02-23 DIAGNOSIS — G4733 Obstructive sleep apnea (adult) (pediatric): Secondary | ICD-10-CM | POA: Diagnosis not present

## 2017-02-25 ENCOUNTER — Encounter (HOSPITAL_COMMUNITY): Payer: Self-pay | Admitting: Psychiatry

## 2017-03-02 ENCOUNTER — Ambulatory Visit (HOSPITAL_COMMUNITY): Payer: Self-pay | Admitting: Adult Health

## 2017-03-02 ENCOUNTER — Ambulatory Visit (HOSPITAL_COMMUNITY): Payer: Self-pay

## 2017-03-03 ENCOUNTER — Encounter (HOSPITAL_COMMUNITY): Payer: Self-pay | Admitting: Adult Health

## 2017-03-03 ENCOUNTER — Encounter (HOSPITAL_BASED_OUTPATIENT_CLINIC_OR_DEPARTMENT_OTHER): Payer: Medicare Other

## 2017-03-03 ENCOUNTER — Encounter (HOSPITAL_BASED_OUTPATIENT_CLINIC_OR_DEPARTMENT_OTHER): Payer: Medicare Other | Admitting: Adult Health

## 2017-03-03 VITALS — BP 116/64 | HR 79 | Temp 98.2°F | Resp 16 | Ht 68.0 in | Wt 256.5 lb

## 2017-03-03 VITALS — BP 123/83 | HR 74 | Temp 97.9°F | Resp 16

## 2017-03-03 DIAGNOSIS — E876 Hypokalemia: Secondary | ICD-10-CM | POA: Diagnosis not present

## 2017-03-03 DIAGNOSIS — C50919 Malignant neoplasm of unspecified site of unspecified female breast: Secondary | ICD-10-CM | POA: Diagnosis not present

## 2017-03-03 DIAGNOSIS — C229 Malignant neoplasm of liver, not specified as primary or secondary: Secondary | ICD-10-CM

## 2017-03-03 DIAGNOSIS — C7931 Secondary malignant neoplasm of brain: Secondary | ICD-10-CM | POA: Diagnosis not present

## 2017-03-03 DIAGNOSIS — R5383 Other fatigue: Secondary | ICD-10-CM | POA: Diagnosis not present

## 2017-03-03 DIAGNOSIS — C7951 Secondary malignant neoplasm of bone: Secondary | ICD-10-CM | POA: Diagnosis not present

## 2017-03-03 DIAGNOSIS — R5382 Chronic fatigue, unspecified: Secondary | ICD-10-CM

## 2017-03-03 DIAGNOSIS — Z0000 Encounter for general adult medical examination without abnormal findings: Secondary | ICD-10-CM

## 2017-03-03 DIAGNOSIS — F418 Other specified anxiety disorders: Secondary | ICD-10-CM | POA: Diagnosis not present

## 2017-03-03 DIAGNOSIS — C787 Secondary malignant neoplasm of liver and intrahepatic bile duct: Secondary | ICD-10-CM | POA: Diagnosis not present

## 2017-03-03 DIAGNOSIS — R609 Edema, unspecified: Secondary | ICD-10-CM | POA: Diagnosis not present

## 2017-03-03 DIAGNOSIS — G479 Sleep disorder, unspecified: Secondary | ICD-10-CM | POA: Diagnosis not present

## 2017-03-03 DIAGNOSIS — G4730 Sleep apnea, unspecified: Secondary | ICD-10-CM | POA: Diagnosis not present

## 2017-03-03 DIAGNOSIS — R530 Neoplastic (malignant) related fatigue: Secondary | ICD-10-CM

## 2017-03-03 DIAGNOSIS — G893 Neoplasm related pain (acute) (chronic): Secondary | ICD-10-CM | POA: Diagnosis not present

## 2017-03-03 LAB — CBC WITH DIFFERENTIAL/PLATELET
BASOS PCT: 0 %
Basophils Absolute: 0 10*3/uL (ref 0.0–0.1)
EOS ABS: 0 10*3/uL (ref 0.0–0.7)
EOS PCT: 0 %
HCT: 36.7 % (ref 36.0–46.0)
HEMOGLOBIN: 11.8 g/dL — AB (ref 12.0–15.0)
Lymphocytes Relative: 19 %
Lymphs Abs: 1 10*3/uL (ref 0.7–4.0)
MCH: 29.4 pg (ref 26.0–34.0)
MCHC: 32.2 g/dL (ref 30.0–36.0)
MCV: 91.3 fL (ref 78.0–100.0)
Monocytes Absolute: 0.5 10*3/uL (ref 0.1–1.0)
Monocytes Relative: 10 %
NEUTROS PCT: 71 %
Neutro Abs: 3.7 10*3/uL (ref 1.7–7.7)
PLATELETS: 226 10*3/uL (ref 150–400)
RBC: 4.02 MIL/uL (ref 3.87–5.11)
RDW: 18.1 % — ABNORMAL HIGH (ref 11.5–15.5)
WBC: 5.2 10*3/uL (ref 4.0–10.5)

## 2017-03-03 LAB — COMPREHENSIVE METABOLIC PANEL
ALK PHOS: 94 U/L (ref 38–126)
ALT: 18 U/L (ref 14–54)
AST: 24 U/L (ref 15–41)
Albumin: 3.8 g/dL (ref 3.5–5.0)
Anion gap: 10 (ref 5–15)
BILIRUBIN TOTAL: 0.5 mg/dL (ref 0.3–1.2)
BUN: 9 mg/dL (ref 6–20)
CALCIUM: 9.6 mg/dL (ref 8.9–10.3)
CO2: 28 mmol/L (ref 22–32)
CREATININE: 0.85 mg/dL (ref 0.44–1.00)
Chloride: 102 mmol/L (ref 101–111)
Glucose, Bld: 131 mg/dL — ABNORMAL HIGH (ref 65–99)
Potassium: 3.7 mmol/L (ref 3.5–5.1)
Sodium: 140 mmol/L (ref 135–145)
Total Protein: 7.2 g/dL (ref 6.5–8.1)

## 2017-03-03 MED ORDER — HEPARIN SOD (PORK) LOCK FLUSH 100 UNIT/ML IV SOLN
500.0000 [IU] | Freq: Once | INTRAVENOUS | Status: AC
  Administered 2017-03-03: 500 [IU] via INTRAVENOUS

## 2017-03-03 MED ORDER — MORPHINE SULFATE ER 30 MG PO TBCR: 30.0000 mg | EXTENDED_RELEASE_TABLET | Freq: Two times a day (BID) | ORAL | 0 refills | Status: DC

## 2017-03-03 MED ORDER — LAPATINIB DITOSYLATE 250 MG PO TABS: 1250.0000 mg | ORAL_TABLET | Freq: Every day | ORAL | 1 refills | Status: DC

## 2017-03-03 MED ORDER — LORAZEPAM 0.5 MG PO TABS: 0.5000 mg | ORAL_TABLET | Freq: Four times a day (QID) | ORAL | 1 refills | Status: DC | PRN

## 2017-03-03 MED ORDER — SODIUM CHLORIDE 0.9 % IV SOLN
Freq: Once | INTRAVENOUS | Status: AC
  Administered 2017-03-03: 12:00:00 via INTRAVENOUS

## 2017-03-03 MED ORDER — ZOLEDRONIC ACID 4 MG/100ML IV SOLN
4.0000 mg | Freq: Once | INTRAVENOUS | Status: AC
  Administered 2017-03-03: 4 mg via INTRAVENOUS
  Filled 2017-03-03: qty 100

## 2017-03-03 NOTE — Procedures (Signed)
  Patient Name: [name], Barnard Date: 02/23/2017 Gender: Female D.O.B: 06/07/1965 Age (years): 51 Referring Provider: Lulu Riding Height (inches): 36 Interpreting Physician: Baird Lyons MD, ABSM Weight (lbs): 255 RPSGT: Zadie Rhine BMI: 39 MRN: [id number] Neck Size: 15.00 CLINICAL INFORMATION Sleep Study Type: NPSG     Indication for sleep study: Daytime Fatigue, Fatigue  Epworth Sleepiness Score: 4  SLEEP STUDY TECHNIQUE As per the AASM Manual for the Scoring of Sleep and Associated Events v2.3 (April 2016) with a hypopnea requiring 4% desaturations.  The channels recorded and monitored were frontal, central and occipital EEG, electrooculogram (EOG), submentalis EMG (chin), nasal and oral airflow, thoracic and abdominal wall motion, anterior tibialis EMG, snore microphone, electrocardiogram, and pulse oximetry.  MEDICATIONS Medications self-administered by patient taken the night of the study : AMBIEN, PROTONIX, MORPHINE, MELATONIN, GABAPENTIN  SLEEP ARCHITECTURE The study was initiated at 9:49:03 PM and ended at 4:27:36 AM.  Sleep onset time was 89.8 minutes and the sleep efficiency was 62.2%. The total sleep time was 248.0 minutes.  Stage REM latency was 255.5 minutes.  The patient spent 14.11% of the night in stage N1 sleep, 67.14% in stage N2 sleep, 3.23% in stage N3 and 15.52% in REM.  Alpha intrusion was absent.  Supine sleep was 73.28%.  RESPIRATORY PARAMETERS The overall apnea/hypopnea index (AHI) was 45.2 per hour. There were 155 total apneas, including 55 obstructive, 95 central and 5 mixed apneas. There were 32 hypopneas and 4 RERAs.  The AHI during Stage REM sleep was 67.0 per hour.  AHI while supine was 59.4 per hour.  The mean oxygen saturation was 92.83%. The minimum SpO2 during sleep was 81.00%.  moderate snoring was noted during this study.  CARDIAC DATA The 2 lead EKG demonstrated sinus rhythm. The mean heart rate was 63.15 beats  per minute. Other EKG findings include: PVCs.  LEG MOVEMENT DATA The total PLMS were 0 with a resulting PLMS index of 0.00. Associated arousal with leg movement index was 0.0 .  IMPRESSIONS - Severe obstructive sleep apnea occurred during this study (AHI = 45.2/h). - Moderate central sleep apnea occurred during this study (CAI = 23.0/h). - Moderate oxygen desaturation was noted during this study (Min O2 = 81.00%). - The patient snored with moderate snoring volume. - EKG findings include PVCs. - Clinically significant periodic limb movements did not occur during sleep. No significant associated arousals.  DIAGNOSIS - Obstructive Sleep Apnea (327.23 [G47.33 ICD-10]) - Central Sleep Apnea (327.27 [G47.37 ICD-10]) - Nocturnal Hypoxemia (327.26 [G47.36 ICD-10])  RECOMMENDATIONS - CPAP titration to determine optimal pressure required to alleviate sleep disordered breathing. - BiPAP or ASV titration may be required to eliminate central sleep apnea, if symptomatic after control of OSA.. - Positional therapy avoiding supine position during sleep. - Avoid alcohol, sedatives and other CNS depressants that may worsen sleep apnea and disrupt normal sleep architecture. - Sleep hygiene should be reviewed to assess factors that may improve sleep quality. - Weight management and regular exercise should be initiated or continued if appropriate.  [Electronically signed] 03/03/2017 02:41 PM  Baird Lyons MD, [place], American Board of Sleep Medicine   NPI: [id number]  [place], [facility]  ELECTRONICALLY SIGNED ON:  03/03/2017, 2:39 PM [place] PH: [phone]   FX: [phone] [place]

## 2017-03-03 NOTE — Progress Notes (Signed)
Treatment given per orders. Patient tolerated it well without problems. Vitals stable and discharged home from clinic ambulatory. Follow up as scheduled.  

## 2017-03-03 NOTE — Addendum Note (Signed)
Addended by: Holley Bouche on: 03/03/2017 01:42 PM   Modules accepted: Orders

## 2017-03-03 NOTE — Patient Instructions (Signed)
[place] at [facility] Discharge Instructions  RECOMMENDATIONS MADE BY THE CONSULTANT AND ANY TEST RESULTS WILL BE SENT TO YOUR REFERRING PHYSICIAN.  Zometa given today per orders Follow up as scheduled.  Thank you for choosing [place] at [facility] to provide your oncology and hematology care.  To afford each patient quality time with our provider, please arrive at least 15 minutes before your scheduled appointment time.    If you have a lab appointment with the [facility] please come in thru the  Main Entrance and check in at the main information desk  You need to re-schedule your appointment should you arrive 10 or more minutes late.  We strive to give you quality time with our providers, and arriving late affects you and other patients whose appointments are after yours.  Also, if you no show three or more times for appointments you may be dismissed from the clinic at the providers discretion.     Again, thank you for choosing [facility].  Our hope is that these requests will decrease the amount of time that you wait before being seen by our physicians.       _____________________________________________________________  Should you have questions after your visit to [facility], please contact our office at [phone] between the hours of 8:30 a.m. and 4:30 p.m.  Voicemails left after 4:30 p.m. will not be returned until the following business day.  For prescription refill requests, have your pharmacy contact our office.       Resources For Cancer Patients and their Caregivers ? American Cancer Society: Can assist with transportation, wigs, general needs, runs Look Good Feel Better.        (775)462-9583 ? Cancer Care: Provides financial assistance, online support groups, medication/co-pay assistance.  1-800-813-HOPE (360)755-4308) ? [facility] Assists [place] cancer  patients and their families through emotional , educational and financial support.  239-775-3887 ? [place] DSS Where to apply for food stamps, Medicaid and utility assistance. (819)450-3236 ? [employer]: Transportation to medical appointments. (747)004-7875 ? Social Security Administration: May apply for disability if have a Stage IV cancer. 586-073-9317 224-366-8647 ? [employer], Disability and Transit Services: Assists with nutrition, care and transit needs. [place] Support Programs: @10RELATIVEDAYS @ > Cancer Support Group  2nd Tuesday of the month 1pm-2pm, Journey Room  > Creative Journey  3rd Tuesday of the month 1130am-1pm, Journey Room  > Look Good Feel Better  1st Wednesday of the month 10am-12 noon, Journey Room (Call [place] to register [phone])

## 2017-03-03 NOTE — Progress Notes (Signed)
[name], [place] [address]   CLINIC:  Medical Oncology/Hematology  PCP:  [name], MD [place] [address] [phone]   REASON FOR VISIT:  Follow-up for Stage IV metastatic breast adenocarcinoma with liver, bone, brain, and spine mets; ER+/HER2+   CURRENT THERAPY: Xeloda 1500 mg AM and 1500 mg PM, 7 days on/7 days off schedule with Tykerb daily & Zometa monthly   BRIEF ONCOLOGIC HISTORY:    Breast cancer, stage 4 ([facility])   04/25/2014 Initial Diagnosis    Breast cancer, stage 4      04/25/2014 Imaging    CT abdomen/pelvis with widespread metastatic disease to the liver, multiple lytic lesions throughout spine and pelvis. No FX or epidural tumor identified      04/26/2014 Imaging    CT head unremarkable      04/26/2014 Imaging    CT chest with no lung mass or pulmonary nodules, no adenopathy. Lytic bone lesions, right 2nd rib      04/27/2014 Initial Biopsy    U/S guided liver biopsy, lesion in anterior and inferior left hepatic lobe biopsied      04/27/2014 Pathology Results    Metastatic adenocarcinoma, CK7, ER+, patchy positivity with PR. Possible primary includes breast, less likely gynecologic      05/15/2014 Mammogram    BI-RADS CATEGORY  2: Benign Finding(s)      05/16/2014 PET scan    1. Intensely hypermetabolic hepatic metastasis. 2. Widespread hypermetabolic skeletal lesions. 3. No primary adenocarcinoma identified by FDG PET imaging.      05/19/2014 Imaging    MUGA- Left ventricular ejection fracture greater than 70%.      05/21/2014 Breast MRI    No suspicious masses or enhancement within the breasts. No axillary adenopathy.      05/22/2014 - 07/03/2014 Antibody Plan    Herceptin/Perjeta/Tamoxifen      06/12/2014 - 07/03/2014 Chemotherapy    Taxotere added secondary to persistent abdominal and back pain      06/17/2014 - 06/19/2014 Hospital Admission    Neutropenia, fever, diarrhea, nausea,  vomiting      06/20/2014 - 07/10/2014 Radiation Therapy    Dr. Thea Silversmith 12 fractions to L3-S3 (30 Gy) and left scapula (20 Gy).       07/03/2014 Adverse Reaction    Perjeta- induced diarrhea.  Perjeta discontinued      07/16/2014 - 07/20/2014 Hospital Admission    Electrolyte abnormalities, and diarrhea.  Suspect Perjeta-induced diarrhea.  Negative GI work-up.      07/24/2014 - 08/19/2015 Chemotherapy    Herceptin/Tamoxifen/Xgeva      08/21/2014 Imaging    MUGA- Left ventricular ejection fraction equals 71%.      08/24/2014 PET scan    Dramatic reduction in metabolic activity of the widespread liver metastasis. Liver metastasis now have metabolic activity equal to background normal liver activity. Liver has a nodular contour. Marked reduction in metabolic activity of skeletal lesions..      10/05/2014 Progression    Widespread metastatic disease to the brain as described. Between 20 and 30 intracranial metastatic deposits are now seen. No midline shift or incipient herniation      10/09/2014 - 10/26/2014 Radiation Therapy    Whole Brain XRT      11/14/2014 Imaging    MUGA- LVEF 67%      02/13/2015 Imaging    MUGA- LVEF 59%      02/15/2015 Treatment Plan Change    Due  to declining LVEF, will hold Herceptin per PI guidelines.      04/12/2015 -  Chemotherapy    Herceptin restarted      06/02/2015 - 06/08/2015 Hospital Admission    Pneumonia      07/05/2015 Progression     PET/CT concern for mild progression of skeletal metastasis with several lesions within the spine and 1 lesion in the Left iliac wing with mild to moderate metabolic activity new from prior. Rising CA 27-29      07/16/2015 - 10/23/2015 Anti-estrogen oral therapy    Arimidex      07/16/2015 Imaging    MRI brain with satisfactory post treatment apperance of brain. interval resolved enhancing R caudate metastasis, minimal punctate residual enhancing metastatic disease at the inferior L cerebellum. No new  metastatic disease or new intracranial abnormality      07/19/2015 Treatment Plan Change    Discontinue Tamoxifen, Zoladex plus Arimidex.       08/27/2015 Procedure    Laparoscopic bilateral salpingo-oophorectomy by Dr. Gaetano Net      10/17/2015 PET scan    Osseous metastatic disease appears slightly progressive based on a new right scapular lesion and increased uptake within lesions in the thoracic spine, left iliac wing and  proximal right femur.      10/17/2015 Progression    Slight progression on PET scan imaging.      10/18/2015 Imaging    REsolved enhancing metastatic disease to the brain status post WBXRT      10/23/2015 - 02/12/2016 Adjuvant Chemotherapy    Faslodex loading followed by maintenance dose.  (Herceptin continued)      11/04/2015 Imaging    MUGA- LEFT ventricular ejection fraction 51% slightly decreased in a 57% on the previous exam.      12/24/2015 Treatment Plan Change    Zometa every 28 days.  Xgeva discontinued.        01/01/2016 Imaging    MUGA- Left ventricular ejection fraction equals 57.9%. This is increased from 51.1% previously.      02/03/2016 PET scan    1. Mixed metabolic changes in the scattered hypermetabolic sclerotic osseous metastases throughout the axial and proximal appendicular skeleton as detailed above. 2. No new sites of hypermetabolic metastatic disease. Stable pseudo-cirrhotic appearance of the liver due to treated liver metastases with no hypermetabolic liver metastases.       02/03/2016 Progression    PET shows mixed osseous response.  Some lesions more hypermetabolic, others improved.      02/05/2016 Treatment Plan Change    D/C Herceptin.  Faslodex as scheduled on 02/12/2016, then discontinued.  Continue Zometa.      02/05/2016 Treatment Plan Change    Prescriptions for Xeloda 7 days on and 7 days off and Tykerb printed and provided for authorization.      02/19/2016 -  Chemotherapy    Xeloda 2300 mg BID 7 days on and 7 days  off and Tykerb       02/24/2016 Treatment Plan Change    Xeloda dose reduced by 10% to 2000 mg BID week on and week off.      03/18/2016 Treatment Plan Change    Xeloda dose reduced to 2000 mg in AM and 1500 mg in PM 7 days on and 7 days off.      03/23/2016 Imaging    MUGA- Normal LEFT ventricular ejection fraction of 56% not significantly changed from 58% on previous exam.      05/19/2016 Imaging    MRI brain- .  No new focus of abnormal enhancement to suggest interval metastatic disease. 2. No acute intracranial abnormality. 3. Stable foci of T2 FLAIR hyperintensity in white matter and in the right caudate head compatible with posttreatment changes and treated metastasis.      05/20/2016 PET scan    1. The multiple osseous metastatic lesions shown to be hypermetabolic on the prior exam are reduced in activity or even resolved in hypermetabolic activity compared to prior, as detailed above. There are also numerous sclerotic bony lesions wedge are not currently and were not previously hypermetabolic, compatible with old non active lesions. 2. No findings of extra osseous metastatic disease currently. 3. Hepatic pseudocirrhosis. 4. Healing rib fractures. Chronic pathologic fracture the left acromion.      07/08/2016 Imaging    MUGA- Low normal LEFT ventricular ejection fraction of 53%, little changed since the 56% on 03/23/2016 but slightly decreased from the 58% on 01/01/2016.      09/07/2016 Echocardiogram    MUGA- Normal LEFT ventricular ejection fraction of 59% with normal LV wall motion.      09/07/2016 Treatment Plan Change    Xeloda dose reduced to 1500 mg BID 7 days on and 7 days off.      09/15/2016 PET scan    No significant change in diffuse sclerotic bone metastases on CT images, although several show mildly increased FDG uptake since prior study.  No evidence of soft tissue metastatic disease.      11/02/2016 Imaging    MRI brain- No change from the  prior study. Previously noted metastatic deposits have been treated. No new lesions identified.        INTERVAL HISTORY:  [name] 51 y.o. female presents for continued follow-up for metastatic breast cancer.   Here today with her husband.   Overall, she tells me she has been feeling "pretty good."  Appetite 50%; energy levels 25%.  This is her "on week" of chemo, so she generally feels a little worse during the "on week" of treatment in terms of appetite, energy levels, nausea, and diarrhea.    Chart reviewed; she completed sleep study (ordered by psychiatry-Dr. Daron Offer). She has not received formal results of this study yet. Endorses some anxiety/anticipation about when her treatments will start with psychiatry.    Requesting refill of Ativan and Morphine today.   Remains on Xeloda as directed and Tykerb daily; requesting refill of Tykerb today. She is scheduled for early-interval PET scan on 03/18/17.    Her husband continues to report issues in terms of insurance coverage and healthcare bills. Report frustration between working with [facility] billing and Medicare.  Patient and husband have been working with Durene Cal, our patient advocate, to help address their financial/insurance concerns from a cancer center standpoint as much as possible.      REVIEW OF SYSTEMS:  Review of Systems - Oncology, per HPI. Otherwise, 12-point ROS completed and negative.      PAST MEDICAL/SURGICAL HISTORY:  Past Medical History:  Diagnosis Date  . Anticoagulated    xarelto  . Anxiety   . Breast cancer metastasized to multiple sites [facility])    liver, brain, and bone  . Breast cancer, stage 4 [facility]) oncologist-  dr Larene [place] ([facility])   dx 12/ 2015 -- breast cancer Stage 4,  ER/HER2 +,  w/  liver, brain and  bone mets/  chemotherapy and radiation therapy  . Chronic pain syndrome    secondary to cancer   . Depression   .  Drug-induced cardiomyopathy ([place])    per last MUGA  (08-08-2015), ef 56.5/ per last echo 05-18-2014 ef 60-65%  . Family history of prostate cancer   . GERD (gastroesophageal reflux disease)   . History of colon polyps    07-13-2013  benign  . History of DVT (deep vein thrombosis)    07-09-2014  upper right extremity-  RIJ and right subclavian--  resolved  . History of gastritis    erosive  . History of pneumonia    HCAP 06-07-2015--  resolved per cxr 07-04-2015  . History of radiation therapy    12 fractions to L3 - S3, 30Gy and left spacula 20Gy (06-20-2014 to 07-10-2014) //  whole brain rxt (10-09-2014 to 10-26-2014)  . History of small bowel obstruction    S/P RESECTION 2008  . Migraine   . PONV (postoperative nausea and vomiting)    pt states scope patch does well   Past Surgical History:  Procedure Laterality Date  . BREAST REDUCTION SURGERY  03/17/2011   Procedure: MAMMARY REDUCTION BILATERAL (BREAST);  Surgeon: Mary A Contogiannis;  Location: [facility];  Service: Plastics;  Laterality: Bilateral;  . CATARACT EXTRACTION W/ INTRAOCULAR LENS  IMPLANT, BILATERAL  2008  . CERVICAL FUSION  2003   C5 -- C6  . COLONOSCOPY N/A 07/13/2013   Procedure: COLONOSCOPY;  Surgeon: Rogene Houston, MD;  [facility];  Service: Endoscopy;  Laterality: N/A;  930  . COLONOSCOPY N/A 11/26/2014   Procedure: COLONOSCOPY;  Surgeon: Rogene Houston, MD;  [facility];  Service: Endoscopy;  Laterality: N/A;  730  . D & C HYSTEROSCOPY/  RESECTION ENDOMETRIAL MASS/  [place] ENDOMETRIAL ABLATION  04-11-2010  . DX LAPAROSCOPY W/ PARTIAL SMALL BOWEL RESECTION AND APPENDECTOMY  04-13-2007  . ESOPHAGOGASTRODUODENOSCOPY N/A 05/25/2014   Procedure: ESOPHAGOGASTRODUODENOSCOPY (EGD);  Surgeon: Rogene Houston, MD;  [facility];  Service: Endoscopy;  Laterality: N/A;  155  . KNEE ARTHROSCOPY Right 2005  . LAPAROSCOPIC ASSISTED VAGINAL HYSTERECTOMY  10-13-2010   w/ Bx Left Fallopian tube and Aspiration Right Ovarian  Cyst  . LAPAROSCOPIC CHOLECYSTECTOMY  11-17-2002  . LAPAROSCOPIC SALPINGO OOPHERECTOMY Bilateral 08/26/2015   Procedure: LAPAROSCOPIC SALPINGO OOPHORECTOMY, bilateral;  Surgeon: Everlene Farrier, MD;  Location: [facility];  Service: Gynecology;  Laterality: Bilateral;  . PORTACATH PLACEMENT  05-17-2014  . REDUCTION MAMMAPLASTY    . SHOULDER ARTHROSCOPY WITH ROTATOR CUFF REPAIR Right 2002  . TRANSTHORACIC ECHOCARDIOGRAM  05-18-2014   ef 60-65%//   last MUGA  (08-08-2015)  ef 56.6%       SOCIAL HISTORY:  Social History   Social History  . Marital status: Married    Spouse name: N/A  . Number of children: N/A  . Years of education: N/A   Occupational History  . Not on file.   Social History Main Topics  . Smoking status: Former Smoker    Types: Cigarettes    Quit date: 08/20/1994  . Smokeless tobacco: Never Used  . Alcohol use Yes     Comment: Occasionally  . Drug use: No  . Sexual activity: Not on file   Other Topics Concern  . Not on file   Social History Narrative  . No narrative on file    FAMILY HISTORY:  Family History  Problem Relation Age of Onset  . Diabetes Father   . Heart attack Maternal Grandmother 30       multiple over lifetime.  . Cancer Maternal Grandmother 41  NOS  . Prostate cancer Maternal Grandfather        dx in his 33s  . Lung cancer Paternal Grandfather        dx <50  . Lymphoma Maternal Aunt        dx in her 39s  . Melanoma Cousin 13       maternal first cousin  . Brain cancer Cousin        paternal first cousin dx under 19  . Prostate cancer Other        MGF's father  . Colon cancer Other        MGM's mother    CURRENT MEDICATIONS:  Outpatient Encounter Prescriptions as of 03/03/2017  Medication Sig  . acetaminophen (TYLENOL) 325 MG tablet Take 650 mg by mouth every 6 (six) hours as needed for moderate pain or headache.  . calcium carbonate (TUMS - DOSED IN MG ELEMENTAL CALCIUM) 500 MG chewable tablet Chew 1  tablet by mouth daily.  . Calcium-Phosphorus-Vitamin D (CALCIUM/D3 ADULT GUMMIES PO) Take 2 tablets by mouth 2 (two) times daily. dosage of calcium 1269m and vitamin d 100221m . desvenlafaxine (PRISTIQ) 100 MG 24 hr tablet Take 1 tablet (100 mg total) by mouth daily.  . diphenhydrAMINE (BENADRYL) 25 mg capsule Take 25 mg by mouth every 6 (six) hours as needed for itching.  . furosemide (LASIX) 20 MG tablet Take 20 mg by mouth 2 (two) times daily.  . Marland Kitchenabapentin (NEURONTIN) 300 MG capsule Take 2 capsules (600 mg total) by mouth at bedtime.  . lapatinib (TYKERB) 250 MG tablet Take 5 tablets (1,250 mg total) by mouth daily.  . Marland Kitchenidocaine-prilocaine (EMLA) cream Apply 1 application topically daily as needed (apply to port before chemo).  . LORazepam (ATIVAN) 0.5 MG tablet Take 1 tablet (0.5 mg total) by mouth every 6 (six) hours as needed for anxiety.  . magic mouthwash SOLN Swish and swallow 21m74mour times a day.  . Melatonin 10 MG CAPS Take 1 tablet by mouth. Pt states she takes one tablet at night for sleep  . methylphenidate (RITALIN) 20 MG tablet TAKE ONE TABLET BY MOUTH TWICE DAILY - ONE EVERY MORNING & ONE AT LUNCH / BEFORE 1PM  . morphine (MS CONTIN) 30 MG 12 hr tablet Take 1 tablet (30 mg total) by mouth every 12 (twelve) hours.  . ondansetron (ZOFRAN) 8 MG tablet Take 1 tablet (8 mg total) by mouth every 8 (eight) hours as needed for nausea or vomiting.  . oMarland KitchenyCODONE (OXY IR/ROXICODONE) 5 MG immediate release tablet Take 1-2 tablets (5-10 mg total) by mouth every 4 (four) hours as needed for moderate pain.  . pantoprazole (PROTONIX) 40 MG tablet Take 1 tablet (40 mg total) by mouth every 12 (twelve) hours.  . potassium chloride SA (K-DUR,KLOR-CON) 20 MEQ tablet Take 1 tablet (20 mEq total) by mouth 3 (three) times daily.  . promethazine (PHENERGAN) 25 MG tablet TAKE 1 TABLET BY MOUTH EVERY SIX HOURS AS NEEDED FOR NAUSEA OR VOMITING  . rivaroxaban (XARELTO) 20 MG TABS tablet TAKE 1 TABLET BY  MOUTH EVERY DAY WITH SUPPER  . spironolactone (ALDACTONE) 25 MG tablet Take 1 tablet (25 mg total) by mouth daily.  . XELODA 500 MG tablet Take 1500 mg PO BID, 7 days on and 7 days off  . Zoledronic Acid (ZOMETA) 4 MG/100ML IVPB Inject 100 mLs (4 mg total) into the vein every 30 (thirty) days.  . zMarland Kitchenlpidem (AMBIEN) 10 MG tablet Take 1 tablet (10 mg  total) by mouth at bedtime as needed.  . [DISCONTINUED] lapatinib (TYKERB) 250 MG tablet Take 5 tablets (1,250 mg total) by mouth daily.   No facility-administered encounter medications on file as of 03/03/2017.     ALLERGIES:  No Known Allergies   PHYSICAL EXAM:  ECOG Performance status: 1 - Symptomatic, but independent.   Vitals:   03/03/17 1050  BP: 116/64  Pulse: 79  Resp: 16  Temp: 98.2 F (36.8 C)  SpO2: 99%   Filed Weights   03/03/17 1050  Weight: 256 lb 8 oz (116.3 kg)     Physical Exam  Constitutional: She is oriented to person, place, and time and well-developed, well-nourished, and in no distress.  HENT:  Head: Normocephalic.  Mouth/Throat: Oropharynx is clear and moist. No oropharyngeal exudate.  Eyes: Pupils are equal, round, and reactive to light. Conjunctivae are normal. No scleral icterus.  Neck: Normal range of motion. Neck supple.  Cardiovascular: Normal rate and regular rhythm.   Pulmonary/Chest: Effort normal and breath sounds normal. No respiratory distress. She has no wheezes.  Abdominal: Soft. Bowel sounds are normal. There is no tenderness.  Musculoskeletal: Normal range of motion. She exhibits no edema.  Lymphadenopathy:    She has no cervical adenopathy.       Right: No supraclavicular adenopathy present.       Left: No supraclavicular adenopathy present.  Neurological: She is alert and oriented to person, place, and time. No cranial nerve deficit. Gait normal.  Skin: Skin is warm and dry. No rash noted.  Psychiatric: Mood, memory, affect and judgment normal.  Nursing note and vitals  reviewed.    LABORATORY DATA:  I have reviewed the labs as listed.  CBC    Component Value Date/Time   WBC 5.2 03/03/2017 1056   RBC 4.02 03/03/2017 1056   HGB 11.8 (L) 03/03/2017 1056   HCT 36.7 03/03/2017 1056   PLT 226 03/03/2017 1056   MCV 91.3 03/03/2017 1056   MCH 29.4 03/03/2017 1056   MCHC 32.2 03/03/2017 1056   RDW 18.1 (H) 03/03/2017 1056   LYMPHSABS 1.0 03/03/2017 1056   MONOABS 0.5 03/03/2017 1056   EOSABS 0.0 03/03/2017 1056   BASOSABS 0.0 03/03/2017 1056   CMP Latest Ref Rng & Units 03/03/2017 02/12/2017 02/02/2017  Glucose 65 - 99 mg/dL 131(H) - 136(H)  BUN 6 - 20 mg/dL 9 - 8  Creatinine 0.44 - 1.00 mg/dL 0.85 - 0.80  Sodium 135 - 145 mmol/L 140 - 138  Potassium 3.5 - 5.1 mmol/L 3.7 3.2(L) 3.2(L)  Chloride 101 - 111 mmol/L 102 - 99(L)  CO2 22 - 32 mmol/L 28 - 30  Calcium 8.9 - 10.3 mg/dL 9.6 - 9.3  Total Protein 6.5 - 8.1 g/dL 7.2 - 7.4  Total Bilirubin 0.3 - 1.2 mg/dL 0.5 - 0.3  Alkaline Phos 38 - 126 U/L 94 - 94  AST 15 - 41 U/L 24 - 27  ALT 14 - 54 U/L 18 - 19         PENDING LABS:     DIAGNOSTIC IMAGING:  Most recent PET scan: 01/20/17 CLINICAL DATA:  Subsequent treatment strategy for breast cancer.  EXAM: NUCLEAR MEDICINE PET SKULL BASE TO THIGH  TECHNIQUE: 12.4 mCi F-18 FDG was injected intravenously. Full-ring PET imaging was performed from the skull base to thigh after the radiotracer. CT data was obtained and used for attenuation correction and anatomic localization.  FASTING BLOOD GLUCOSE:  Value: 114 mg/dl  COMPARISON:  09/16/2026  FINDINGS:  NECK: No hypermetabolic lymph nodes in the neck.  CHEST: No hypermetabolic mediastinal or hilar nodes. No suspicious pulmonary nodules on the CT scan. Right Port-A-Cath tip is in the distal SVC.  ABDOMEN/PELVIS: No abnormal hypermetabolic activity within the liver, pancreas, adrenal glands, or spleen. No hypermetabolic lymph nodes in the abdomen or pelvis.  SKELETON:  Multiple hypermetabolic bone metastases are again noted. Some of the show stable FDG accumulation (right glenoid lesion with SUV max = 12.3 today compared to 12.4 previously). Some of the bone lesions have improved in the interval (sclerotic T7 lesion with SUV max = 5.7 today compared 11.8 previously). Some of the bone lesions show interval increase in hypermetabolism (left iliac crest lesion with SUV max = 16.9 today compared to 12.8 Previously) and the previously described progressive right hip lesion with uptake in the right femoral neck increasing to an SUV max = 16.2 today from 11.3 previously . Some of the hypermetabolic bone lesions are new in the interval (left T8 transverse process lesion with SUV max = 5.7) Some of the sclerotic bone lesion show no hypermetabolism on either study (L4 lesion).  IMPRESSION: 1. No evidence for hypermetabolic soft tissue metastases. 2. No marked change in hypermetabolic bone metastases with some variability in bony lesion response as some lesions appear stable while others have either progressed or decreased.   Electronically Signed   By: Misty Stanley M.D.   On: 01/20/2017 14:34    Most recent MUGA scan: 01/18/17 CLINICAL DATA:  Stage IV breast cancer with osseous metastases, cardiotoxic chemotherapy  EXAM: NUCLEAR MEDICINE CARDIAC BLOOD POOL IMAGING (MUGA)  TECHNIQUE: Cardiac multi-gated acquisition was performed at rest following intravenous injection of Tc-47mlabeled red blood cells.  RADIOPHARMACEUTICALS:  22 mCi [id number]-vitro labeled autologous red blood cells IV  COMPARISON:  09/07/2016  FINDINGS: Calculated LEFT ventricular ejection fraction is 61%, within the normal range.  This is not significantly changed from the 59% on the previous exam.  Wall motion analysis of the LEFT ventricle in 3 projections is normal.  IMPRESSION: Normal LEFT ventricular ejection fraction of 61% little changed  from the 59% on the previous exam.  Normal LV wall motion.   Electronically Signed   By: MarLavonia DanaD.   On: 01/18/2017 15:26     MRI brain: 11/02/16 CLINICAL DATA:  Metastatic breast cancer. Whole-brain radiation and chemotherapy  EXAM: MRI HEAD WITHOUT AND WITH CONTRAST  TECHNIQUE: Multiplanar, multiecho pulse sequences of the brain and surrounding structures were obtained without and with intravenous contrast.  CONTRAST:  57m34mLTIHANCE GADOBENATE DIMEGLUMINE 529 MG/ML IV SOLN  COMPARISON:  MRI 05/19/2016  FINDINGS: Brain: Negative for enhancing metastatic deposits in the brain. Stable 3 mm T2 hyperintensity in the head of the caudate on the right is unchanged compatible with treated tumor. Other metastatic deposit is no longer enhance. No new lesions identified.  Ventricle size normal. Negative for acute infarct. Mild chronic white matter changes stable. Negative for hemorrhage or mass.  Vascular: Normal arterial flow void  Skull and upper cervical spine: Negative  Sinuses/Orbits: Negative  Other: None  IMPRESSION: No change from the prior study. Previously noted metastatic deposits have been treated. No new lesions identified.   Electronically Signed   By: CharFranchot Gallo.   On: 11/02/2016 17:02     PATHOLOGY:  [employer]: 05/25/14           ASSESSMENT & PLAN:   Stage IV metastatic breast adenocarcinoma with liver, bone, brain, and spine mets;  ER+/HER2+: -Currently taking Xeloda 1500 mg BID 7 days on and 7 days off with Tykerb daily.  -Tumor markers (CA 27-29 & CA 15-3) have been slowly increasing over time.  Most recent PET scan results reviewed in detail with patient and her husband today. We reviewed the images together as well.  Overall, disease is stable with mixed response (some areas of increased hypermetabolism/growth, and other areas of response).    -Given continuing increase in tumor markers, discussed  obtaining short-interval PET scan in 03/2017 to assess for progressive disease. PET is scheduled for 03/18/17. Will await results before making any possible changes in her treatment plan.  -Last MUGA scan 01/18/17 normal with EF 61%.  She will be due for next cardiac evaluation given potential cardiotoxicity of Tykerb therapy in 04/2017. There were reportedly issues with her insurance covering MUGA scans; shared with her that ECHO would be adequate and a cheaper alternative to MUGA scan to monitor her heart function over time.  She agreed with this plan.  Orders for ECHO placed today; will be due in 04/2017.  -Continue current treatment with Xeloda and Tykerb. Requesting refill of Tykerb today. Will ask Dr. Talbert Cage to refill and we will fax Rx to her pharmacy for home delivery.   -Return to cancer center in 1 month for follow-up with next Zometa infusion.   Depression/Anxiety:  -Continue current medication regimen per psychiatry with Pristiq daily and Ativan PRN. She is requesting refill of Ativan today; paper prescription provided to patient.  -Dr. Daron Offer with psychiatry is planning ECT for patient, three times week for ~4 weeks.   -She is anxious about when her treatments will start. She has not heard from Dr. Joycelyn Schmid office; I explained that he will likely review the sleep study results and then she will likely be referred to ECT specialist for consultation and treatment planning. I will reach out to Dr. Daron Offer and ask that his team touch base with patient re: plan of care when they deem appropriate.    Fatigue/Sleep disturbance:  -Sleep study completed on 02/23/17 (ordered by psychiatry); she has not received those results yet. My crude review of results does appear to show some element of sleep apnea.  I will reach out to Dr. Daron Offer to see if his team can follow-up with patient regarding steps re: sleep disturbance/altered breathing during sleep.  -Continue Ambien at bedtime.   Hypokalemia/Chronic LE  edema:  -Resolved. I switched her from Lasix to Aldactone at last visit to spare potassium loss.  Serum potassium normal at 3.7 today.  -Continue Aldactone.  I think she can stop potassium supplementation at this time.  Of course, will continue to monitor her serum potassium in the future with subsequent labs.    Bone mets:  -Continue monthly Zometa. Due for next dose today; serum calcium adequate for infusion today as scheduled.   Cancer-related fatigue:  -Continue Ritalin 20 mg BID, although she historically only takes once per day.  She generally does not take the medication BID, but it is helpful with first dose in the mornings.    In the future, I would be reluctant to further increase the dose given tachycardia and possibility of cardiotoxicity with [place]. -Psychiatry recommends continuing Ritalin 20 mg BID; no refills needed today per patient.   Chronic pain secondary to neoplasm:  -MS Contin effective in managing her pain.  Maintain current dose of MS Contin for basal pain control with oxycodone PRN. Bowels reportedly moving well.  Requesting refill of MS  Contin; [place] Controlled Substance Reporting System reviewed and refill appropriate; paper prescription given to patient today.     Macular edema:  -Continue follow-up with ophthalmology as directed.       Dispo:  -Return to cancer center in 1 month for follow-up with next Zometa infusion.  -ECHO due in 04/2017; orders placed today.    All questions were answered to patient's stated satisfaction. Encouraged patient to call with any new concerns or questions before her next visit to the cancer center and we can certain see her sooner, if needed.    Plan of care discussed with Dr. Talbert Cage, who agrees with the above aforementioned.    Orders placed this encounter:  Orders Placed This Encounter  Procedures  . ECHOCARDIOGRAM COMPLETE      Mike Craze, NP [place] [phone]

## 2017-03-04 ENCOUNTER — Encounter (HOSPITAL_COMMUNITY): Payer: Self-pay | Admitting: Psychiatry

## 2017-03-04 ENCOUNTER — Other Ambulatory Visit (HOSPITAL_COMMUNITY): Payer: Self-pay | Admitting: Psychiatry

## 2017-03-04 DIAGNOSIS — G4731 Primary central sleep apnea: Secondary | ICD-10-CM

## 2017-03-04 DIAGNOSIS — G4733 Obstructive sleep apnea (adult) (pediatric): Secondary | ICD-10-CM

## 2017-03-04 LAB — CANCER ANTIGEN 15-3: CAN 15 3: 66.5 U/mL — AB (ref 0.0–25.0)

## 2017-03-04 LAB — CANCER ANTIGEN 27.29: CA 27.29: 79.3 U/mL — ABNORMAL HIGH (ref 0.0–38.6)

## 2017-03-08 ENCOUNTER — Ambulatory Visit (HOSPITAL_BASED_OUTPATIENT_CLINIC_OR_DEPARTMENT_OTHER): Payer: Medicare Other | Attending: Psychiatry | Admitting: Internal Medicine

## 2017-03-08 VITALS — Ht 68.0 in | Wt 256.0 lb

## 2017-03-08 DIAGNOSIS — H3562 Retinal hemorrhage, left eye: Secondary | ICD-10-CM | POA: Diagnosis not present

## 2017-03-08 DIAGNOSIS — G4733 Obstructive sleep apnea (adult) (pediatric): Secondary | ICD-10-CM | POA: Insufficient documentation

## 2017-03-08 DIAGNOSIS — G4731 Primary central sleep apnea: Secondary | ICD-10-CM

## 2017-03-08 DIAGNOSIS — H34831 Tributary (branch) retinal vein occlusion, right eye, with macular edema: Secondary | ICD-10-CM | POA: Diagnosis not present

## 2017-03-08 DIAGNOSIS — H43813 Vitreous degeneration, bilateral: Secondary | ICD-10-CM | POA: Diagnosis not present

## 2017-03-15 DIAGNOSIS — G4733 Obstructive sleep apnea (adult) (pediatric): Secondary | ICD-10-CM

## 2017-03-15 NOTE — Procedures (Signed)
Patient Name: [name], [name] Date: 03/08/2017 Gender: Female D.O.B: Jun 20, 1965 Age (years): 51 Referring Provider: Lulu Riding Height (inches): 27 Interpreting Physician: Baird Lyons MD, ABSM Weight (lbs): 255 RPSGT: Zadie Rhine BMI: 71 MRN: [id number] Neck Size: 15.00 CLINICAL INFORMATION The patient is referred for a CPAP titration to treat sleep apnea.  Date of NPSG, Split Night or HST: NPSG 02/23/17  AHI 45.2/ hr, desaturation to a nadir of 81%, body weight 255 lbs  SLEEP STUDY TECHNIQUE As per the AASM Manual for the Scoring of Sleep and Associated Events v2.3 (April 2016) with a hypopnea requiring 4% desaturations.  The channels recorded and monitored were frontal, central and occipital EEG, electrooculogram (EOG), submentalis EMG (chin), nasal and oral airflow, thoracic and abdominal wall motion, anterior tibialis EMG, snore microphone, electrocardiogram, and pulse oximetry. Continuous positive airway pressure (CPAP) was initiated at the beginning of the study and titrated to treat sleep-disordered breathing.  MEDICATIONS Medications self-administered by patient taken the night of the study : AMBIEN, PROTONIX, MORPHINE, MELATONIN, GABAPENTIN  TECHNICIAN COMMENTS Comments added by technician: ONE RESTROOM VIST Comments added by scorer: N/A RESPIRATORY PARAMETERS Optimal PAP Pressure (cm): 13 AHI at Optimal Pressure (/hr): 7.2 Overall Minimal O2 (%): 83.00 Supine % at Optimal Pressure (%): 100 Minimal O2 at Optimal Pressure (%): 88.0    SLEEP ARCHITECTURE The study was initiated at 9:54:32 PM and ended at 4:58:42 AM.  Sleep onset time was 79.3 minutes and the sleep efficiency was 76.7%. The total sleep time was 325.5 minutes.  The patient spent 2.15% of the night in stage N1 sleep, 75.27% in stage N2 sleep, 0.77% in stage N3 and 21.81% in REM.Stage REM latency was 36.0 minutes  Wake after sleep onset was 19.4. Alpha intrusion was absent. Supine sleep was  70.20%.  CARDIAC DATA The 2 lead EKG demonstrated sinus rhythm. The mean heart rate was 69.62 beats per minute. Other EKG findings include: PVCs.  LEG MOVEMENT DATA The total Periodic Limb Movements of Sleep (PLMS) were 0. The PLMS index was 0.00. A PLMS index of <15 is considered normal in adults.  IMPRESSIONS - The optimal PAP pressure was 13 cm of water. - Central sleep apnea was not noted during this titration (CAI = 4.4/h). - Moderate oxygen desaturations were observed during this titration (Min O2 sat at CPAP 13 = 88,.00%, Mean 93.3%). - No snoring was audible during this study. - 2-lead EKG demonstrated: PVCs - Clinically significant periodic limb movements were not noted during this study. Arousals associated with PLMs were rare.  DIAGNOSIS - Obstructive Sleep Apnea (327.23 [G47.33 ICD-10])  RECOMMENDATIONS - Trial of CPAP therapy on 13 cm H2O. Patient wore a Small size Resmed Full Face Mask AirFit F20 mask and heated humidification. - Be careful with alcohol, sedatives and other CNS depressants that may worsen sleep apnea and disrupt normal sleep architecture. - Sleep hygiene should be reviewed to assess factors that may improve sleep quality. - Weight management and regular exercise should be initiated or continued.  [Electronically signed] 03/15/2017 07:16 AM  Baird Lyons MD, [place], American Board of Sleep Medicine   NPI: [id number]

## 2017-03-15 NOTE — Progress Notes (Signed)
Good morning, we should be getting a fax from the sleep clinic so we can order [name]'s mask. Let me know, Thank you!

## 2017-03-17 ENCOUNTER — Telehealth (HOSPITAL_COMMUNITY): Payer: Self-pay | Admitting: Adult Health

## 2017-03-17 NOTE — Telephone Encounter (Signed)
Peer-to-peer review completed for PET scan with Dr. Lannette Donath with [employer].  I explained that her disease has been best monitored with PET/CT over time and is helpful to compare treatment response.  She has not had conventional imaging (CT scan) for restaging since 2016. Therefore, continued PET scan is helpful, particularly in the setting of rising tumor markers as well.   Approval #: [id number].  Expiration: 05/01/17   Mike Craze, NP [employer] [phone]

## 2017-03-18 ENCOUNTER — Ambulatory Visit (HOSPITAL_COMMUNITY): Payer: 59

## 2017-03-20 ENCOUNTER — Other Ambulatory Visit (HOSPITAL_COMMUNITY): Payer: Self-pay | Admitting: Adult Health

## 2017-03-20 DIAGNOSIS — C50919 Malignant neoplasm of unspecified site of unspecified female breast: Secondary | ICD-10-CM

## 2017-03-21 ENCOUNTER — Other Ambulatory Visit (HOSPITAL_COMMUNITY): Payer: Self-pay | Admitting: Oncology

## 2017-03-21 DIAGNOSIS — G4700 Insomnia, unspecified: Secondary | ICD-10-CM

## 2017-03-22 ENCOUNTER — Other Ambulatory Visit (HOSPITAL_COMMUNITY): Payer: Self-pay | Admitting: Emergency Medicine

## 2017-03-22 DIAGNOSIS — C50919 Malignant neoplasm of unspecified site of unspecified female breast: Secondary | ICD-10-CM

## 2017-03-22 MED ORDER — XELODA 500 MG PO TABS: ORAL_TABLET | ORAL | 1 refills | Status: DC

## 2017-03-24 ENCOUNTER — Encounter (HOSPITAL_COMMUNITY)
Admission: RE | Admit: 2017-03-24 | Discharge: 2017-03-24 | Disposition: A | Discharge status: Home / Self Care | Payer: Medicare Other | Source: Ambulatory Visit | Attending: Adult Health | Admitting: Adult Health

## 2017-03-24 DIAGNOSIS — C50919 Malignant neoplasm of unspecified site of unspecified female breast: Secondary | ICD-10-CM | POA: Insufficient documentation

## 2017-03-24 DIAGNOSIS — C7951 Secondary malignant neoplasm of bone: Secondary | ICD-10-CM | POA: Insufficient documentation

## 2017-03-24 LAB — GLUCOSE, CAPILLARY: GLUCOSE-CAPILLARY: 110 mg/dL — AB (ref 65–99)

## 2017-03-24 MED ORDER — FLUDEOXYGLUCOSE F - 18 (FDG) INJECTION
13.0000 | Freq: Once | INTRAVENOUS | Status: AC | PRN
  Administered 2017-03-24: 13 via INTRAVENOUS

## 2017-03-25 ENCOUNTER — Telehealth (HOSPITAL_COMMUNITY): Payer: Self-pay | Admitting: Adult Health

## 2017-03-25 NOTE — Telephone Encounter (Signed)
[name] returned my call to review her PET scan results via phone.  I shared with her that overall her disease appears stable; there is no evidence of soft tissue or visceral metastatic disease.  The skeletal metastases remain present, but are largely stable.  Discussed with Dr. Talbert Cage; we do not recommend any treatment changes at this time.  Will likely reevaluate in 3 months with restaging PET scan at that time.  Overall she tells me she has been feeling well.  She received her CPAP machine for her sleep apnea, and is getting adjusted to wearing this at night.  Otherwise she feels like she is doing well.  She will return to the cancer center as scheduled next week for routine follow-up.  Encouraged her to call us with any additional questions or concerns before that time if needed.     Mike Craze, NP [place] [phone]

## 2017-03-25 NOTE — Telephone Encounter (Signed)
Attempted to reach patient via phone to review PET results.  Left her a message asking her to return my call when she is able.   Mike Craze, NP [place] [phone]

## 2017-03-31 ENCOUNTER — Other Ambulatory Visit: Payer: Self-pay

## 2017-03-31 ENCOUNTER — Encounter (HOSPITAL_COMMUNITY): Payer: Self-pay | Admitting: Adult Health

## 2017-03-31 ENCOUNTER — Encounter (HOSPITAL_BASED_OUTPATIENT_CLINIC_OR_DEPARTMENT_OTHER): Payer: Medicare Other

## 2017-03-31 ENCOUNTER — Encounter (HOSPITAL_COMMUNITY): Payer: Medicare Other | Attending: Oncology | Admitting: Adult Health

## 2017-03-31 VITALS — BP 119/69 | HR 75 | Temp 97.8°F | Resp 20 | Wt 259.0 lb

## 2017-03-31 DIAGNOSIS — C787 Secondary malignant neoplasm of liver and intrahepatic bile duct: Secondary | ICD-10-CM

## 2017-03-31 DIAGNOSIS — C7931 Secondary malignant neoplasm of brain: Secondary | ICD-10-CM

## 2017-03-31 DIAGNOSIS — F418 Other specified anxiety disorders: Secondary | ICD-10-CM | POA: Diagnosis not present

## 2017-03-31 DIAGNOSIS — R530 Neoplastic (malignant) related fatigue: Secondary | ICD-10-CM

## 2017-03-31 DIAGNOSIS — C229 Malignant neoplasm of liver, not specified as primary or secondary: Secondary | ICD-10-CM | POA: Insufficient documentation

## 2017-03-31 DIAGNOSIS — C50919 Malignant neoplasm of unspecified site of unspecified female breast: Secondary | ICD-10-CM

## 2017-03-31 DIAGNOSIS — R5382 Chronic fatigue, unspecified: Secondary | ICD-10-CM

## 2017-03-31 DIAGNOSIS — C7951 Secondary malignant neoplasm of bone: Secondary | ICD-10-CM | POA: Insufficient documentation

## 2017-03-31 DIAGNOSIS — G479 Sleep disorder, unspecified: Secondary | ICD-10-CM | POA: Diagnosis not present

## 2017-03-31 DIAGNOSIS — G893 Neoplasm related pain (acute) (chronic): Secondary | ICD-10-CM

## 2017-03-31 DIAGNOSIS — Z0000 Encounter for general adult medical examination without abnormal findings: Secondary | ICD-10-CM

## 2017-03-31 LAB — COMPREHENSIVE METABOLIC PANEL
ALT: 21 U/L (ref 14–54)
AST: 30 U/L (ref 15–41)
Albumin: 3.8 g/dL (ref 3.5–5.0)
Alkaline Phosphatase: 94 U/L (ref 38–126)
Anion gap: 8 (ref 5–15)
BUN: 7 mg/dL (ref 6–20)
CHLORIDE: 98 mmol/L — AB (ref 101–111)
CO2: 29 mmol/L (ref 22–32)
CREATININE: 0.87 mg/dL (ref 0.44–1.00)
Calcium: 9.4 mg/dL (ref 8.9–10.3)
Glucose, Bld: 105 mg/dL — ABNORMAL HIGH (ref 65–99)
POTASSIUM: 3.6 mmol/L (ref 3.5–5.1)
SODIUM: 135 mmol/L (ref 135–145)
Total Bilirubin: 0.8 mg/dL (ref 0.3–1.2)
Total Protein: 6.9 g/dL (ref 6.5–8.1)

## 2017-03-31 LAB — CBC WITH DIFFERENTIAL/PLATELET
Basophils Absolute: 0 10*3/uL (ref 0.0–0.1)
Basophils Relative: 0 %
EOS PCT: 0 %
Eosinophils Absolute: 0 10*3/uL (ref 0.0–0.7)
HCT: 35 % — ABNORMAL LOW (ref 36.0–46.0)
Hemoglobin: 10.8 g/dL — ABNORMAL LOW (ref 12.0–15.0)
LYMPHS ABS: 1.1 10*3/uL (ref 0.7–4.0)
Lymphocytes Relative: 24 %
MCH: 28.4 pg (ref 26.0–34.0)
MCHC: 30.9 g/dL (ref 30.0–36.0)
MCV: 92.1 fL (ref 78.0–100.0)
Monocytes Absolute: 0.4 10*3/uL (ref 0.1–1.0)
Monocytes Relative: 9 %
Neutro Abs: 3 10*3/uL (ref 1.7–7.7)
Neutrophils Relative %: 67 %
PLATELETS: 219 10*3/uL (ref 150–400)
RBC: 3.8 MIL/uL — AB (ref 3.87–5.11)
RDW: 18.3 % — AB (ref 11.5–15.5)
WBC: 4.5 10*3/uL (ref 4.0–10.5)

## 2017-03-31 MED ORDER — ZOLEDRONIC ACID 4 MG/100ML IV SOLN
4.0000 mg | Freq: Once | INTRAVENOUS | Status: AC
  Administered 2017-03-31: 4 mg via INTRAVENOUS
  Filled 2017-03-31: qty 100

## 2017-03-31 MED ORDER — HEPARIN SOD (PORK) LOCK FLUSH 100 UNIT/ML IV SOLN
500.0000 [IU] | Freq: Once | INTRAVENOUS | Status: AC | PRN
  Administered 2017-03-31: 500 [IU]

## 2017-03-31 MED ORDER — SODIUM CHLORIDE 0.9 % IV SOLN
Freq: Once | INTRAVENOUS | Status: AC
  Administered 2017-03-31: 11:00:00 via INTRAVENOUS

## 2017-03-31 MED ORDER — MORPHINE SULFATE ER 30 MG PO TBCR: 30.0000 mg | EXTENDED_RELEASE_TABLET | Freq: Two times a day (BID) | ORAL | 0 refills | Status: DC

## 2017-03-31 MED ORDER — ALTEPLASE 2 MG IJ SOLR: 2.0000 mg | Freq: Once | INTRAMUSCULAR | Status: DC | PRN

## 2017-03-31 NOTE — Patient Instructions (Signed)
[place] at [facility] Discharge Instructions  RECOMMENDATIONS MADE BY THE CONSULTANT AND ANY TEST RESULTS WILL BE SENT TO YOUR REFERRING PHYSICIAN.  zometa given today Follow up as scheduled.  Thank you for choosing [place] at [facility] to provide your oncology and hematology care.  To afford each patient quality time with our provider, please arrive at least 15 minutes before your scheduled appointment time.    If you have a lab appointment with the [facility] please come in thru the  Main Entrance and check in at the main information desk  You need to re-schedule your appointment should you arrive 10 or more minutes late.  We strive to give you quality time with our providers, and arriving late affects you and other patients whose appointments are after yours.  Also, if you no show three or more times for appointments you may be dismissed from the clinic at the providers discretion.     Again, thank you for choosing [facility].  Our hope is that these requests will decrease the amount of time that you wait before being seen by our physicians.       _____________________________________________________________  Should you have questions after your visit to [facility], please contact our office at [phone] between the hours of 8:30 a.m. and 4:30 p.m.  Voicemails left after 4:30 p.m. will not be returned until the following business day.  For prescription refill requests, have your pharmacy contact our office.       Resources For Cancer Patients and their Caregivers ? American Cancer Society: Can assist with transportation, wigs, general needs, runs Look Good Feel Better.        937-446-2968 ? Cancer Care: Provides financial assistance, online support groups, medication/co-pay assistance.  1-800-813-HOPE 3802870044) ? [facility] Assists [place] cancer patients and their  families through emotional , educational and financial support.  320-417-9995 ? [place] DSS Where to apply for food stamps, Medicaid and utility assistance. (478) 314-4031 ? [facility]: Transportation to medical appointments. 507-435-4466 ? Social Security Administration: May apply for disability if have a Stage IV cancer. (217)015-3821 774-017-9694 ? [employer], Disability and Transit Services: Assists with nutrition, care and transit needs. Blue Support Programs: @10RELATIVEDAYS @ > Cancer Support Group  2nd Tuesday of the month 1pm-2pm, Journey Room  > Creative Journey  3rd Tuesday of the month 1130am-1pm, Journey Room  > Look Good Feel Better  1st Wednesday of the month 10am-12 noon, Journey Room (Call [place] to register [phone])

## 2017-03-31 NOTE — Progress Notes (Signed)
Treatment given per orders. Patient tolerated it well without problems. Vitals stable and discharged home from clinic ambulatory. Follow up as scheduled.  

## 2017-03-31 NOTE — Progress Notes (Signed)
[name], [place] [address]   CLINIC:  Medical Oncology/Hematology  PCP:  [name], MD [place] [address] [phone]   REASON FOR VISIT:  Follow-up for Stage IV metastatic breast adenocarcinoma with liver, bone, brain, and spine mets; ER+/HER2+   CURRENT THERAPY: Xeloda 1500 mg AM and 1500 mg PM, 7 days on/7 days off schedule with Tykerb daily & Zometa monthly   BRIEF ONCOLOGIC HISTORY:    Breast cancer, stage 4 ([place])   04/25/2014 Initial Diagnosis    Breast cancer, stage 4      04/25/2014 Imaging    CT abdomen/pelvis with widespread metastatic disease to the liver, multiple lytic lesions throughout spine and pelvis. No FX or epidural tumor identified      04/26/2014 Imaging    CT head unremarkable      04/26/2014 Imaging    CT chest with no lung mass or pulmonary nodules, no adenopathy. Lytic bone lesions, right 2nd rib      04/27/2014 Initial Biopsy    U/S guided liver biopsy, lesion in anterior and inferior left hepatic lobe biopsied      04/27/2014 Pathology Results    Metastatic adenocarcinoma, CK7, ER+, patchy positivity with PR. Possible primary includes breast, less likely gynecologic      05/15/2014 Mammogram    BI-RADS CATEGORY  2: Benign Finding(s)      05/16/2014 PET scan    1. Intensely hypermetabolic hepatic metastasis. 2. Widespread hypermetabolic skeletal lesions. 3. No primary adenocarcinoma identified by FDG PET imaging.      05/19/2014 Imaging    MUGA- Left ventricular ejection fracture greater than 70%.      05/21/2014 Breast MRI    No suspicious masses or enhancement within the breasts. No axillary adenopathy.      05/22/2014 - 07/03/2014 Antibody Plan    Herceptin/Perjeta/Tamoxifen      06/12/2014 - 07/03/2014 Chemotherapy    Taxotere added secondary to persistent abdominal and back pain      06/17/2014 - 06/19/2014 Hospital Admission    Neutropenia, fever, diarrhea, nausea,  vomiting      06/20/2014 - 07/10/2014 Radiation Therapy    Dr. Thea Silversmith 12 fractions to L3-S3 (30 Gy) and left scapula (20 Gy).       07/03/2014 Adverse Reaction    Perjeta- induced diarrhea.  Perjeta discontinued      07/16/2014 - 07/20/2014 Hospital Admission    Electrolyte abnormalities, and diarrhea.  Suspect Perjeta-induced diarrhea.  Negative GI work-up.      07/24/2014 - 08/19/2015 Chemotherapy    Herceptin/Tamoxifen/Xgeva      08/21/2014 Imaging    MUGA- Left ventricular ejection fraction equals 71%.      08/24/2014 PET scan    Dramatic reduction in metabolic activity of the widespread liver metastasis. Liver metastasis now have metabolic activity equal to background normal liver activity. Liver has a nodular contour. Marked reduction in metabolic activity of skeletal lesions..      10/05/2014 Progression    Widespread metastatic disease to the brain as described. Between 20 and 30 intracranial metastatic deposits are now seen. No midline shift or incipient herniation      10/09/2014 - 10/26/2014 Radiation Therapy    Whole Brain XRT      11/14/2014 Imaging    MUGA- LVEF 67%      02/13/2015 Imaging    MUGA- LVEF 59%      02/15/2015 Treatment Plan Change    Due  to declining LVEF, will hold Herceptin per PI guidelines.      04/12/2015 -  Chemotherapy    Herceptin restarted      06/02/2015 - 06/08/2015 Hospital Admission    Pneumonia      07/05/2015 Progression     PET/CT concern for mild progression of skeletal metastasis with several lesions within the spine and 1 lesion in the Left iliac wing with mild to moderate metabolic activity new from prior. Rising CA 27-29      07/16/2015 - 10/23/2015 Anti-estrogen oral therapy    Arimidex      07/16/2015 Imaging    MRI brain with satisfactory post treatment apperance of brain. interval resolved enhancing R caudate metastasis, minimal punctate residual enhancing metastatic disease at the inferior L cerebellum. No new  metastatic disease or new intracranial abnormality      07/19/2015 Treatment Plan Change    Discontinue Tamoxifen, Zoladex plus Arimidex.       08/27/2015 Procedure    Laparoscopic bilateral salpingo-oophorectomy by Dr. Gaetano Net      10/17/2015 PET scan    Osseous metastatic disease appears slightly progressive based on a new right scapular lesion and increased uptake within lesions in the thoracic spine, left iliac wing and  proximal right femur.      10/17/2015 Progression    Slight progression on PET scan imaging.      10/18/2015 Imaging    REsolved enhancing metastatic disease to the brain status post WBXRT      10/23/2015 - 02/12/2016 Adjuvant Chemotherapy    Faslodex loading followed by maintenance dose.  (Herceptin continued)      11/04/2015 Imaging    MUGA- LEFT ventricular ejection fraction 51% slightly decreased in a 57% on the previous exam.      12/24/2015 Treatment Plan Change    Zometa every 28 days.  Xgeva discontinued.        01/01/2016 Imaging    MUGA- Left ventricular ejection fraction equals 57.9%. This is increased from 51.1% previously.      02/03/2016 PET scan    1. Mixed metabolic changes in the scattered hypermetabolic sclerotic osseous metastases throughout the axial and proximal appendicular skeleton as detailed above. 2. No new sites of hypermetabolic metastatic disease. Stable pseudo-cirrhotic appearance of the liver due to treated liver metastases with no hypermetabolic liver metastases.       02/03/2016 Progression    PET shows mixed osseous response.  Some lesions more hypermetabolic, others improved.      02/05/2016 Treatment Plan Change    D/C Herceptin.  Faslodex as scheduled on 02/12/2016, then discontinued.  Continue Zometa.      02/05/2016 Treatment Plan Change    Prescriptions for Xeloda 7 days on and 7 days off and Tykerb printed and provided for authorization.      02/19/2016 -  Chemotherapy    Xeloda 2300 mg BID 7 days on and 7 days  off and Tykerb       02/24/2016 Treatment Plan Change    Xeloda dose reduced by 10% to 2000 mg BID week on and week off.      03/18/2016 Treatment Plan Change    Xeloda dose reduced to 2000 mg in AM and 1500 mg in PM 7 days on and 7 days off.      03/23/2016 Imaging    MUGA- Normal LEFT ventricular ejection fraction of 56% not significantly changed from 58% on previous exam.      05/19/2016 Imaging    MRI brain- .  No new focus of abnormal enhancement to suggest interval metastatic disease. 2. No acute intracranial abnormality. 3. Stable foci of T2 FLAIR hyperintensity in white matter and in the right caudate head compatible with posttreatment changes and treated metastasis.      05/20/2016 PET scan    1. The multiple osseous metastatic lesions shown to be hypermetabolic on the prior exam are reduced in activity or even resolved in hypermetabolic activity compared to prior, as detailed above. There are also numerous sclerotic bony lesions wedge are not currently and were not previously hypermetabolic, compatible with old non active lesions. 2. No findings of extra osseous metastatic disease currently. 3. Hepatic pseudocirrhosis. 4. Healing rib fractures. Chronic pathologic fracture the left acromion.      07/08/2016 Imaging    MUGA- Low normal LEFT ventricular ejection fraction of 53%, little changed since the 56% on 03/23/2016 but slightly decreased from the 58% on 01/01/2016.      09/07/2016 Echocardiogram    MUGA- Normal LEFT ventricular ejection fraction of 59% with normal LV wall motion.      09/07/2016 Treatment Plan Change    Xeloda dose reduced to 1500 mg BID 7 days on and 7 days off.      09/15/2016 PET scan    No significant change in diffuse sclerotic bone metastases on CT images, although several show mildly increased FDG uptake since prior study.  No evidence of soft tissue metastatic disease.      11/02/2016 Imaging    MRI brain- No change from the  prior study. Previously noted metastatic deposits have been treated. No new lesions identified.        INTERVAL HISTORY:  Ms. Godden 51 y.o. female presents for continued follow-up for metastatic breast cancer.   Here today with her husband.   Recently she feels like she has been feeling better, in terms of her energy levels.  She has been using her CPAP for a few weeks now and has noticed a difference in her sleep quality and her daytime fatigue.  Appetite 50%; energy levels 25%, but improving.    She continues to have her usual complaints of intermittent nausea, weakness, rash/itching, dizziness, peripheral neuropathy to her feet. None of these symptoms are worse and do not interfere with her ADLs.    Requesting refill of Morphine today.   Remains on Xeloda as directed and Tykerb daily. No refills necessary at this time; she is taking as prescribed.   She had PET scan recently and we reviewed those results over the phone. She would like to review them today with her husband.    Her husband continues to report issues in terms of insurance coverage and healthcare bills. He is requesting a letter of medical necessity for her upcoming ECHO. He has been working with our patient advocate, Durene Cal, often to address these concerns.  These issues cause both the patient and her husband significant anxiety/stress.      REVIEW OF SYSTEMS:  Review of Systems - Oncology, per HPI. Otherwise, 12-point ROS completed and negative.      PAST MEDICAL/SURGICAL HISTORY:  Past Medical History:  Diagnosis Date  . Anticoagulated    xarelto  . Anxiety   . Breast cancer metastasized to multiple sites [facility])    liver, brain, and bone  . Breast cancer, stage 4 [facility]) oncologist-  dr Larene [place] ([facility])   dx 12/ 2015 -- breast cancer Stage 4,  ER/HER2 +,  w/  liver, brain and  bone mets/  chemotherapy and radiation therapy  . Chronic pain syndrome    secondary to cancer   .  Depression   . Drug-induced cardiomyopathy ([place])    per last MUGA (08-08-2015), ef 56.5/ per last echo 05-18-2014 ef 60-65%  . Family history of prostate cancer   . GERD (gastroesophageal reflux disease)   . History of colon polyps    07-13-2013  benign  . History of DVT (deep vein thrombosis)    07-09-2014  upper right extremity-  RIJ and right subclavian--  resolved  . History of gastritis    erosive  . History of pneumonia    HCAP 06-07-2015--  resolved per cxr 07-04-2015  . History of radiation therapy    12 fractions to L3 - S3, 30Gy and left spacula 20Gy (06-20-2014 to 07-10-2014) //  whole brain rxt (10-09-2014 to 10-26-2014)  . History of small bowel obstruction    S/P RESECTION 2008  . Migraine   . PONV (postoperative nausea and vomiting)    pt states scope patch does well   Past Surgical History:  Procedure Laterality Date  . BREAST REDUCTION SURGERY  03/17/2011   Procedure: MAMMARY REDUCTION BILATERAL (BREAST);  Surgeon: Mary A Contogiannis;  Location: [facility];  Service: Plastics;  Laterality: Bilateral;  . CATARACT EXTRACTION W/ INTRAOCULAR LENS  IMPLANT, BILATERAL  2008  . CERVICAL FUSION  2003   C5 -- C6  . COLONOSCOPY N/A 07/13/2013   Procedure: COLONOSCOPY;  Surgeon: Rogene Houston, MD;  [facility];  Service: Endoscopy;  Laterality: N/A;  930  . COLONOSCOPY N/A 11/26/2014   Procedure: COLONOSCOPY;  Surgeon: Rogene Houston, MD;  [facility];  Service: Endoscopy;  Laterality: N/A;  730  . D & C HYSTEROSCOPY/  RESECTION ENDOMETRIAL MASS/  [place] ENDOMETRIAL ABLATION  04-11-2010  . DX LAPAROSCOPY W/ PARTIAL SMALL BOWEL RESECTION AND APPENDECTOMY  04-13-2007  . ESOPHAGOGASTRODUODENOSCOPY N/A 05/25/2014   Procedure: ESOPHAGOGASTRODUODENOSCOPY (EGD);  Surgeon: Rogene Houston, MD;  [facility];  Service: Endoscopy;  Laterality: N/A;  155  . KNEE ARTHROSCOPY Right 2005  . LAPAROSCOPIC ASSISTED VAGINAL HYSTERECTOMY   10-13-2010   w/ Bx Left Fallopian tube and Aspiration Right Ovarian Cyst  . LAPAROSCOPIC CHOLECYSTECTOMY  11-17-2002  . LAPAROSCOPIC SALPINGO OOPHERECTOMY Bilateral 08/26/2015   Procedure: LAPAROSCOPIC SALPINGO OOPHORECTOMY, bilateral;  Surgeon: Everlene Farrier, MD;  Location: [facility];  Service: Gynecology;  Laterality: Bilateral;  . PORTACATH PLACEMENT  05-17-2014  . REDUCTION MAMMAPLASTY    . SHOULDER ARTHROSCOPY WITH ROTATOR CUFF REPAIR Right 2002  . TRANSTHORACIC ECHOCARDIOGRAM  05-18-2014   ef 60-65%//   last MUGA  (08-08-2015)  ef 56.6%       SOCIAL HISTORY:  Social History   Socioeconomic History  . Marital status: Married    Spouse name: Not on file  . Number of children: Not on file  . Years of education: Not on file  . Highest education level: Not on file  Social Needs  . Financial resource strain: Not on file  . Food insecurity - worry: Not on file  . Food insecurity - inability: Not on file  . Transportation needs - medical: Not on file  . Transportation needs - non-medical: Not on file  Occupational History  . Not on file  Tobacco Use  . Smoking status: Former Smoker    Types: Cigarettes    Last attempt to quit: 08/20/1994    Years since quitting: 22.6  .  Smokeless tobacco: Never Used  Substance and Sexual Activity  . Alcohol use: Yes    Comment: Occasionally  . Drug use: No  . Sexual activity: Not on file  Other Topics Concern  . Not on file  Social History Narrative  . Not on file    FAMILY HISTORY:  Family History  Problem Relation Age of Onset  . Diabetes Father   . Heart attack Maternal Grandmother 30       multiple over lifetime.  . Cancer Maternal Grandmother 65       NOS  . Prostate cancer Maternal Grandfather        dx in his 38s  . Lung cancer Paternal Grandfather        dx <50  . Lymphoma Maternal Aunt        dx in her 70s  . Melanoma Cousin 6       maternal first cousin  . Brain cancer Cousin        paternal  first cousin dx under 40  . Prostate cancer Other        MGF's father  . Colon cancer Other        MGM's mother    CURRENT MEDICATIONS:  Outpatient Encounter Medications as of 03/31/2017  Medication Sig  . acetaminophen (TYLENOL) 325 MG tablet Take 650 mg by mouth every 6 (six) hours as needed for moderate pain or headache.  . calcium carbonate (TUMS - DOSED IN MG ELEMENTAL CALCIUM) 500 MG chewable tablet Chew 1 tablet by mouth daily.  . Calcium-Phosphorus-Vitamin D (CALCIUM/D3 ADULT GUMMIES PO) Take 2 tablets by mouth 2 (two) times daily. dosage of calcium 1277m and vitamin d 10080m . desvenlafaxine (PRISTIQ) 100 MG 24 hr tablet Take 1 tablet (100 mg total) by mouth daily.  . diphenhydrAMINE (BENADRYL) 25 mg capsule Take 25 mg by mouth every 6 (six) hours as needed for itching.  . gabapentin (NEURONTIN) 300 MG capsule Take 2 capsules (600 mg total) by mouth at bedtime.  . lapatinib (TYKERB) 250 MG tablet Take 5 tablets (1,250 mg total) by mouth daily.  . Marland Kitchenidocaine-prilocaine (EMLA) cream Apply 1 application topically daily as needed (apply to port before chemo).  . LORazepam (ATIVAN) 0.5 MG tablet Take 1 tablet (0.5 mg total) by mouth every 6 (six) hours as needed for anxiety.  . magic mouthwash SOLN Swish and swallow 73m33mour times a day.  . Melatonin 10 MG CAPS Take 1 tablet by mouth. Pt states she takes one tablet at night for sleep  . methylphenidate (RITALIN) 20 MG tablet TAKE ONE TABLET BY MOUTH TWICE DAILY - ONE EVERY MORNING & ONE AT LUNCH / BEFORE 1PM  . morphine (MS CONTIN) 30 MG 12 hr tablet Take 1 tablet (30 mg total) by mouth every 12 (twelve) hours.  . ondansetron (ZOFRAN) 8 MG tablet Take 1 tablet (8 mg total) by mouth every 8 (eight) hours as needed for nausea or vomiting.  . oMarland KitchenyCODONE (OXY IR/ROXICODONE) 5 MG immediate release tablet Take 1-2 tablets (5-10 mg total) by mouth every 4 (four) hours as needed for moderate pain.  . pantoprazole (PROTONIX) 40 MG tablet Take 1  tablet (40 mg total) by mouth every 12 (twelve) hours.  . potassium chloride SA (K-DUR,KLOR-CON) 20 MEQ tablet Take 1 tablet (20 mEq total) by mouth 3 (three) times daily.  . promethazine (PHENERGAN) 25 MG tablet TAKE 1 TABLET BY MOUTH EVERY SIX HOURS AS NEEDED FOR NAUSEA OR VOMITING  . rivaroxaban (XARELTO)  20 MG TABS tablet TAKE 1 TABLET BY MOUTH EVERY DAY WITH SUPPER  . spironolactone (ALDACTONE) 25 MG tablet Take 1 tablet (25 mg total) by mouth daily.  . XELODA 500 MG tablet Take 1500 mg PO BID, 7 days on and 7 days off  . Zoledronic Acid (ZOMETA) 4 MG/100ML IVPB Inject 100 mLs (4 mg total) into the vein every 30 (thirty) days.  Marland Kitchen zolpidem (AMBIEN) 10 MG tablet TAKE ONE TABLET BY MOUTH AT BEDTIME AS NEEDED  . [DISCONTINUED] morphine (MS CONTIN) 30 MG 12 hr tablet Take 1 tablet (30 mg total) by mouth every 12 (twelve) hours.  . [DISCONTINUED] furosemide (LASIX) 20 MG tablet Take 20 mg by mouth 2 (two) times daily.   No facility-administered encounter medications on file as of 03/31/2017.     ALLERGIES:  No Known Allergies   PHYSICAL EXAM:  ECOG Performance status: 1 - Symptomatic, but independent.       Physical Exam  Constitutional: She is oriented to person, place, and time and well-developed, well-nourished, and in no distress.  Seen in chemo chair in infusion area   HENT:  Head: Normocephalic.  Mouth/Throat: Oropharynx is clear and moist.  Eyes: Conjunctivae are normal. No scleral icterus.  Neck: Normal range of motion. Neck supple.  Cardiovascular: Normal rate and regular rhythm.  Pulmonary/Chest: Effort normal and breath sounds normal. No respiratory distress.  Abdominal: Soft. Bowel sounds are normal. There is no tenderness.  Musculoskeletal: Normal range of motion. She exhibits no edema.  Lymphadenopathy:    She has no cervical adenopathy.       Right: No supraclavicular adenopathy present.       Left: No supraclavicular adenopathy present.  Neurological: She is  alert and oriented to person, place, and time. No cranial nerve deficit.  Skin: Skin is warm and dry. No rash noted.  Psychiatric: Mood, memory, affect and judgment normal.  Nursing note and vitals reviewed.    LABORATORY DATA:  I have reviewed the labs as listed.  CBC    Component Value Date/Time   WBC 4.5 03/31/2017 0958   RBC 3.80 (L) 03/31/2017 0958   HGB 10.8 (L) 03/31/2017 0958   HCT 35.0 (L) 03/31/2017 0958   PLT 219 03/31/2017 0958   MCV 92.1 03/31/2017 0958   MCH 28.4 03/31/2017 0958   MCHC 30.9 03/31/2017 0958   RDW 18.3 (H) 03/31/2017 0958   LYMPHSABS 1.1 03/31/2017 0958   MONOABS 0.4 03/31/2017 0958   EOSABS 0.0 03/31/2017 0958   BASOSABS 0.0 03/31/2017 0958   CMP Latest Ref Rng & Units 03/31/2017 03/03/2017 02/12/2017  Glucose 65 - 99 mg/dL 105(H) 131(H) -  BUN 6 - 20 mg/dL 7 9 -  Creatinine 0.44 - 1.00 mg/dL 0.87 0.85 -  Sodium 135 - 145 mmol/L 135 140 -  Potassium 3.5 - 5.1 mmol/L 3.6 3.7 3.2(L)  Chloride 101 - 111 mmol/L 98(L) 102 -  CO2 22 - 32 mmol/L 29 28 -  Calcium 8.9 - 10.3 mg/dL 9.4 9.6 -  Total Protein 6.5 - 8.1 g/dL 6.9 7.2 -  Total Bilirubin 0.3 - 1.2 mg/dL 0.8 0.5 -  Alkaline Phos 38 - 126 U/L 94 94 -  AST 15 - 41 U/L 30 24 -  ALT 14 - 54 U/L 21 18 -         PENDING LABS:     DIAGNOSTIC IMAGING:  Most recent PET scan: 03/24/17 CLINICAL DATA:  Subsequent treatment strategy for metastatic breast cancer.  EXAM: NUCLEAR  MEDICINE PET SKULL BASE TO THIGH  TECHNIQUE: 13.0 mCi F-18 FDG was injected intravenously. Full-ring PET imaging was performed from the skull base to thigh after the radiotracer. CT data was obtained and used for attenuation correction and anatomic localization.  FASTING BLOOD GLUCOSE:  Value: 110 mg/dl  COMPARISON:  01/20/2017  FINDINGS: NECK: No hypermetabolic lymph nodes in the neck.  CHEST: No hypermetabolic mediastinal or hilar nodes. No suspicious pulmonary nodules on the CT  scan.  ABDOMEN/PELVIS: No abnormal hypermetabolic activity within the liver, pancreas, adrenal glands, or spleen. No hypermetabolic lymph nodes in the abdomen or pelvis.  SKELETON: Multifocal hypermetabolic skeletal metastases are again noted. The index lesion within the right glenoid has an SUV max equal to 15.9. Previously 12.3. The T7 lesion within the T7 vertebra has an SUV max equal to 7.95. Previously 5.7. Hypermetabolic lesion involving the T10 vertebra and extending into the left posterior elements has an SUV max of 11.82. Previously 8.3. Index lesion within the left iliac crest has an SUV max equal to 15.27. Previously 16.86. Hypermetabolic lesion within the right hip has an SUV max of 17.06. Previously 16.23.  IMPRESSION: 1. No evidence for hypermetabolic soft tissue metastases. 2. No significant change and hypermetabolic bone metastasis with some mild variability in degree of FDG uptake.   Electronically Signed   By: Kerby Moors M.D.   On: 03/24/2017 15:49    Most recent MUGA scan: 01/18/17 CLINICAL DATA:  Stage IV breast cancer with osseous metastases, cardiotoxic chemotherapy  EXAM: NUCLEAR MEDICINE CARDIAC BLOOD POOL IMAGING (MUGA)  TECHNIQUE: Cardiac multi-gated acquisition was performed at rest following intravenous injection of Tc-8mlabeled red blood cells.  RADIOPHARMACEUTICALS:  22 mCi [id number]-vitro labeled autologous red blood cells IV  COMPARISON:  09/07/2016  FINDINGS: Calculated LEFT ventricular ejection fraction is 61%, within the normal range.  This is not significantly changed from the 59% on the previous exam.  Wall motion analysis of the LEFT ventricle in 3 projections is normal.  IMPRESSION: Normal LEFT ventricular ejection fraction of 61% little changed from the 59% on the previous exam.  Normal LV wall motion.   Electronically Signed   By: MarLavonia DanaD.   On: 01/18/2017 15:26     MRI  brain: 11/02/16 CLINICAL DATA:  Metastatic breast cancer. Whole-brain radiation and chemotherapy  EXAM: MRI HEAD WITHOUT AND WITH CONTRAST  TECHNIQUE: Multiplanar, multiecho pulse sequences of the brain and surrounding structures were obtained without and with intravenous contrast.  CONTRAST:  58m75mLTIHANCE GADOBENATE DIMEGLUMINE 529 MG/ML IV SOLN  COMPARISON:  MRI 05/19/2016  FINDINGS: Brain: Negative for enhancing metastatic deposits in the brain. Stable 3 mm T2 hyperintensity in the head of the caudate on the right is unchanged compatible with treated tumor. Other metastatic deposit is no longer enhance. No new lesions identified.  Ventricle size normal. Negative for acute infarct. Mild chronic white matter changes stable. Negative for hemorrhage or mass.  Vascular: Normal arterial flow void  Skull and upper cervical spine: Negative  Sinuses/Orbits: Negative  Other: None  IMPRESSION: No change from the prior study. Previously noted metastatic deposits have been treated. No new lesions identified.   Electronically Signed   By: CharFranchot Gallo.   On: 11/02/2016 17:02     PATHOLOGY:  [facility]: 05/25/14           ASSESSMENT & PLAN:   Stage IV metastatic breast adenocarcinoma with liver, bone, brain, and spine mets; ER+/HER2+: -Currently taking Xeloda 1500 mg BID 7  days on and 7 days off with Tykerb daily.  -Tumor markers (CA 27-29 & CA 15-3) have been slowly increasing over time.  Most recent PET scan results from 03/24/17 reviewed in detail with patient and her husband today. Overall, her disease continues to remain stable without evidence of any visceral/soft tissue mets; bony lesions are largely unchanged/stable with varying degrees of hypermetabolism on PET.   Will plan to restage with PET scan in ~3-4 months; will place orders at subsequent follow-up visits.  -Last MUGA scan 01/18/17 normal with EF 61%.  She will be due for next  cardiac evaluation given potential cardiotoxicity of Tykerb therapy in 04/2017. Since there were reportedly issues with her insurance covering MUGA scans, we have changed these orders to ECHO. ECHO is scheduled for 04/27/17.  I will write a letter of medical necessity for this exam so her insurance company will have it available on file for her.  -Continue current treatment with Xeloda and Tykerb as prescribed.    -Return to cancer center in 1 month for follow-up with next Zometa infusion.   Depression/Anxiety:  -Continue current medication regimen per psychiatry with Pristiq daily and Ativan PRN.  -Continue follow-up with psychiatry as directed.   Fatigue/Sleep disturbance:  -Sleep study completed on 02/23/17 (ordered by psychiatry), showed severe sleep apnea.  She has been using CPAP machine for the past couple of weeks and has seen improvement in her symptoms.  -Continue Ambien at bedtime as needed.   Hypokalemia/Chronic LE edema:  -Hypokalemia resolved. Continue Aldactone for diuresis.   Bone mets:  -Continue monthly Zometa. Due for next dose today; serum calcium adequate for infusion today as scheduled.   Cancer-related fatigue:  -Continue Ritalin 20 mg BID, although she historically only takes once per day.  She generally does not take the medication BID, but it is helpful with first dose in the mornings.    In the future, I would be reluctant to further increase the dose given tachycardia and possibility of cardiotoxicity with Tykerb. -Psychiatry recommends continuing Ritalin 20 mg BID; no refills needed today per patient.   Chronic pain secondary to neoplasm:  -MS Contin effective in managing her pain.  Maintain current dose of MS Contin for basal pain control with oxycodone PRN. Bowels reportedly moving well.  Requesting refill of MS Contin; [place] Controlled Substance Reporting System reviewed and refill appropriate within the next few days; paper prescription given to patient today.           Dispo:  -ECHO in 04/2017 as scheduled.  -Return to cancer center in 1 month for follow-up with next Zometa infusion.     All questions were answered to patient's stated satisfaction. Encouraged patient to call with any new concerns or questions before her next visit to the cancer center and we can certain see her sooner, if needed.    Plan of care discussed with Dr. Talbert Cage, who agrees with the above aforementioned.    Orders placed this encounter:  No orders of the defined types were placed in this encounter.     Mike Craze, NP [place] [phone]

## 2017-03-31 NOTE — Progress Notes (Signed)
    03/31/17   To Whom It May Concern:   Please allow this documentation to serve as a letter of medical necessity for Ms. Koreena H Macmullen (DOB: Mar 16, 1966) for upcoming and future ECHO exams. Ms. Sydney has metastatic breast cancer, currently receiving treatment with oral chemotherapeutic agents of Tykerb and Xeloda.  Tykerb is a potentially cardiotoxic drug and requires periodic cardiac function evaluation.  She also has a history of receiving other biotherapy/chemotherapy medications that can cause late cardiotoxicity, again requiring periodic heart evaluation.  Please feel free to contact our cancer center with any additional questions or concerns regarding this patient and her current plan of care.     Best regards,     Mike Craze, NP [place] [phone]

## 2017-04-01 LAB — CANCER ANTIGEN 15-3: CA 15-3: 70.5 U/mL — ABNORMAL HIGH (ref 0.0–25.0)

## 2017-04-01 LAB — CANCER ANTIGEN 27.29: CA 27.29: 76.1 U/mL — ABNORMAL HIGH (ref 0.0–38.6)

## 2017-04-08 DIAGNOSIS — H43822 Vitreomacular adhesion, left eye: Secondary | ICD-10-CM | POA: Diagnosis not present

## 2017-04-08 DIAGNOSIS — H3563 Retinal hemorrhage, bilateral: Secondary | ICD-10-CM | POA: Diagnosis not present

## 2017-04-08 DIAGNOSIS — H43813 Vitreous degeneration, bilateral: Secondary | ICD-10-CM | POA: Diagnosis not present

## 2017-04-08 DIAGNOSIS — H34831 Tributary (branch) retinal vein occlusion, right eye, with macular edema: Secondary | ICD-10-CM | POA: Diagnosis not present

## 2017-04-19 ENCOUNTER — Other Ambulatory Visit (HOSPITAL_COMMUNITY): Payer: Self-pay | Admitting: Adult Health

## 2017-04-19 DIAGNOSIS — Z0000 Encounter for general adult medical examination without abnormal findings: Secondary | ICD-10-CM

## 2017-04-19 DIAGNOSIS — R5382 Chronic fatigue, unspecified: Secondary | ICD-10-CM

## 2017-04-19 DIAGNOSIS — C7951 Secondary malignant neoplasm of bone: Secondary | ICD-10-CM

## 2017-04-19 DIAGNOSIS — C50919 Malignant neoplasm of unspecified site of unspecified female breast: Secondary | ICD-10-CM

## 2017-04-19 DIAGNOSIS — C229 Malignant neoplasm of liver, not specified as primary or secondary: Secondary | ICD-10-CM

## 2017-04-20 ENCOUNTER — Encounter (HOSPITAL_COMMUNITY): Payer: Self-pay | Admitting: Adult Health

## 2017-04-20 ENCOUNTER — Other Ambulatory Visit (HOSPITAL_COMMUNITY): Payer: Self-pay | Admitting: Adult Health

## 2017-04-20 DIAGNOSIS — Z0000 Encounter for general adult medical examination without abnormal findings: Secondary | ICD-10-CM

## 2017-04-20 DIAGNOSIS — C50919 Malignant neoplasm of unspecified site of unspecified female breast: Secondary | ICD-10-CM

## 2017-04-20 DIAGNOSIS — R5382 Chronic fatigue, unspecified: Secondary | ICD-10-CM

## 2017-04-20 DIAGNOSIS — C7951 Secondary malignant neoplasm of bone: Secondary | ICD-10-CM

## 2017-04-20 DIAGNOSIS — C229 Malignant neoplasm of liver, not specified as primary or secondary: Secondary | ICD-10-CM

## 2017-04-20 MED ORDER — LORAZEPAM 0.5 MG PO TABS: 0.5000 mg | ORAL_TABLET | Freq: Four times a day (QID) | ORAL | 1 refills | Status: DC | PRN

## 2017-04-20 NOTE — Progress Notes (Signed)
Received refill request from patient's pharmacy for Ativan.   [place] Controlled Substance Reporting System reviewed and refill is appropriate on or after 04/20/17. Paper prescription printed & post-dated; will ask nursing to fax Rx to pt's pharmacy.   [place] reviewed:     Mike Craze, NP [place] [phone]

## 2017-04-27 ENCOUNTER — Ambulatory Visit (HOSPITAL_COMMUNITY)
Admission: RE | Admit: 2017-04-27 | Discharge: 2017-04-27 | Disposition: A | Discharge status: Home / Self Care | Payer: Medicare Other | Source: Ambulatory Visit | Attending: Adult Health | Admitting: Adult Health

## 2017-04-27 DIAGNOSIS — I429 Cardiomyopathy, unspecified: Secondary | ICD-10-CM | POA: Insufficient documentation

## 2017-04-27 DIAGNOSIS — C7931 Secondary malignant neoplasm of brain: Secondary | ICD-10-CM | POA: Insufficient documentation

## 2017-04-27 DIAGNOSIS — C50919 Malignant neoplasm of unspecified site of unspecified female breast: Secondary | ICD-10-CM

## 2017-04-27 DIAGNOSIS — C7951 Secondary malignant neoplasm of bone: Secondary | ICD-10-CM | POA: Diagnosis not present

## 2017-04-27 DIAGNOSIS — Z853 Personal history of malignant neoplasm of breast: Secondary | ICD-10-CM | POA: Insufficient documentation

## 2017-04-27 DIAGNOSIS — Z87891 Personal history of nicotine dependence: Secondary | ICD-10-CM | POA: Diagnosis not present

## 2017-04-27 LAB — ECHOCARDIOGRAM COMPLETE
E decel time: 268 msec
E/e' ratio: 8.05
FS: 34 % (ref 28–44)
IVS/LV PW RATIO, ED: 0.85
LA ID, A-P, ES: 34 mm
LA diam end sys: 34 mm
LA vol A4C: 34 ml
LA vol: 41 mL
LADIAMINDEX: 1.49 cm/m2
LAVOLIN: 18 mL/m2
LV E/e' medial: 8.05
LV E/e'average: 8.05
LV TDI E'LATERAL: 12.3
LV TDI E'MEDIAL: 11
LV dias vol index: 47 mL/m2
LV dias vol: 107 mL — AB (ref 46–106)
LV e' LATERAL: 12.3 cm/s
LV sys vol: 40 mL (ref 14–42)
LVOT area: 3.14 cm2
LVOTD: 20 mm
LVSYSVOLIN: 17 mL/m2
Lateral S' vel: 13.1 cm/s
MV Dec: 268
MV Peak grad: 4 mmHg
MVPKAVEL: 93.6 m/s
MVPKEVEL: 99 m/s
PW: 12.2 mm — AB (ref 0.6–1.1)
RV TAPSE: 22.8 mm
Simpson's disk: 63
Stroke v: 67 ml

## 2017-04-27 NOTE — Progress Notes (Signed)
[name], [place] [address]   CLINIC:  Medical Oncology/Hematology  PCP:  [name], MD [place] [address] [phone]   REASON FOR VISIT:  Follow-up for Stage IV metastatic breast adenocarcinoma with liver, bone, brain, and spine mets; ER+/HER2+   CURRENT THERAPY: Xeloda 1500 mg AM and 1500 mg PM, 7 days on/7 days off schedule with Tykerb daily & Zometa monthly   BRIEF ONCOLOGIC HISTORY:    Breast cancer, stage 4 ([place])   04/25/2014 Initial Diagnosis    Breast cancer, stage 4      04/25/2014 Imaging    CT abdomen/pelvis with widespread metastatic disease to the liver, multiple lytic lesions throughout spine and pelvis. No FX or epidural tumor identified      04/26/2014 Imaging    CT head unremarkable      04/26/2014 Imaging    CT chest with no lung mass or pulmonary nodules, no adenopathy. Lytic bone lesions, right 2nd rib      04/27/2014 Initial Biopsy    U/S guided liver biopsy, lesion in anterior and inferior left hepatic lobe biopsied      04/27/2014 Pathology Results    Metastatic adenocarcinoma, CK7, ER+, patchy positivity with PR. Possible primary includes breast, less likely gynecologic      05/15/2014 Mammogram    BI-RADS CATEGORY  2: Benign Finding(s)      05/16/2014 PET scan    1. Intensely hypermetabolic hepatic metastasis. 2. Widespread hypermetabolic skeletal lesions. 3. No primary adenocarcinoma identified by FDG PET imaging.      05/19/2014 Imaging    MUGA- Left ventricular ejection fracture greater than 70%.      05/21/2014 Breast MRI    No suspicious masses or enhancement within the breasts. No axillary adenopathy.      05/22/2014 - 07/03/2014 Antibody Plan    Herceptin/Perjeta/Tamoxifen      06/12/2014 - 07/03/2014 Chemotherapy    Taxotere added secondary to persistent abdominal and back pain      06/17/2014 - 06/19/2014 Hospital Admission    Neutropenia, fever, diarrhea, nausea,  vomiting      06/20/2014 - 07/10/2014 Radiation Therapy    Dr. Thea Silversmith 12 fractions to L3-S3 (30 Gy) and left scapula (20 Gy).       07/03/2014 Adverse Reaction    Perjeta- induced diarrhea.  Perjeta discontinued      07/16/2014 - 07/20/2014 Hospital Admission    Electrolyte abnormalities, and diarrhea.  Suspect Perjeta-induced diarrhea.  Negative GI work-up.      07/24/2014 - 08/19/2015 Chemotherapy    Herceptin/Tamoxifen/Xgeva      08/21/2014 Imaging    MUGA- Left ventricular ejection fraction equals 71%.      08/24/2014 PET scan    Dramatic reduction in metabolic activity of the widespread liver metastasis. Liver metastasis now have metabolic activity equal to background normal liver activity. Liver has a nodular contour. Marked reduction in metabolic activity of skeletal lesions..      10/05/2014 Progression    Widespread metastatic disease to the brain as described. Between 20 and 30 intracranial metastatic deposits are now seen. No midline shift or incipient herniation      10/09/2014 - 10/26/2014 Radiation Therapy    Whole Brain XRT      11/14/2014 Imaging    MUGA- LVEF 67%      02/13/2015 Imaging    MUGA- LVEF 59%      02/15/2015 Treatment Plan Change    Due  to declining LVEF, will hold Herceptin per PI guidelines.      04/12/2015 -  Chemotherapy    Herceptin restarted      06/02/2015 - 06/08/2015 Hospital Admission    Pneumonia      07/05/2015 Progression     PET/CT concern for mild progression of skeletal metastasis with several lesions within the spine and 1 lesion in the Left iliac wing with mild to moderate metabolic activity new from prior. Rising CA 27-29      07/16/2015 - 10/23/2015 Anti-estrogen oral therapy    Arimidex      07/16/2015 Imaging    MRI brain with satisfactory post treatment apperance of brain. interval resolved enhancing R caudate metastasis, minimal punctate residual enhancing metastatic disease at the inferior L cerebellum. No new  metastatic disease or new intracranial abnormality      07/19/2015 Treatment Plan Change    Discontinue Tamoxifen, Zoladex plus Arimidex.       08/27/2015 Procedure    Laparoscopic bilateral salpingo-oophorectomy by Dr. Gaetano Net      10/17/2015 PET scan    Osseous metastatic disease appears slightly progressive based on a new right scapular lesion and increased uptake within lesions in the thoracic spine, left iliac wing and  proximal right femur.      10/17/2015 Progression    Slight progression on PET scan imaging.      10/18/2015 Imaging    REsolved enhancing metastatic disease to the brain status post WBXRT      10/23/2015 - 02/12/2016 Adjuvant Chemotherapy    Faslodex loading followed by maintenance dose.  (Herceptin continued)      11/04/2015 Imaging    MUGA- LEFT ventricular ejection fraction 51% slightly decreased in a 57% on the previous exam.      12/24/2015 Treatment Plan Change    Zometa every 28 days.  Xgeva discontinued.        01/01/2016 Imaging    MUGA- Left ventricular ejection fraction equals 57.9%. This is increased from 51.1% previously.      02/03/2016 PET scan    1. Mixed metabolic changes in the scattered hypermetabolic sclerotic osseous metastases throughout the axial and proximal appendicular skeleton as detailed above. 2. No new sites of hypermetabolic metastatic disease. Stable pseudo-cirrhotic appearance of the liver due to treated liver metastases with no hypermetabolic liver metastases.       02/03/2016 Progression    PET shows mixed osseous response.  Some lesions more hypermetabolic, others improved.      02/05/2016 Treatment Plan Change    D/C Herceptin.  Faslodex as scheduled on 02/12/2016, then discontinued.  Continue Zometa.      02/05/2016 Treatment Plan Change    Prescriptions for Xeloda 7 days on and 7 days off and Tykerb printed and provided for authorization.      02/19/2016 -  Chemotherapy    Xeloda 2300 mg BID 7 days on and 7 days  off and Tykerb       02/24/2016 Treatment Plan Change    Xeloda dose reduced by 10% to 2000 mg BID week on and week off.      03/18/2016 Treatment Plan Change    Xeloda dose reduced to 2000 mg in AM and 1500 mg in PM 7 days on and 7 days off.      03/23/2016 Imaging    MUGA- Normal LEFT ventricular ejection fraction of 56% not significantly changed from 58% on previous exam.      05/19/2016 Imaging    MRI brain- .  No new focus of abnormal enhancement to suggest interval metastatic disease. 2. No acute intracranial abnormality. 3. Stable foci of T2 FLAIR hyperintensity in white matter and in the right caudate head compatible with posttreatment changes and treated metastasis.      05/20/2016 PET scan    1. The multiple osseous metastatic lesions shown to be hypermetabolic on the prior exam are reduced in activity or even resolved in hypermetabolic activity compared to prior, as detailed above. There are also numerous sclerotic bony lesions wedge are not currently and were not previously hypermetabolic, compatible with old non active lesions. 2. No findings of extra osseous metastatic disease currently. 3. Hepatic pseudocirrhosis. 4. Healing rib fractures. Chronic pathologic fracture the left acromion.      07/08/2016 Imaging    MUGA- Low normal LEFT ventricular ejection fraction of 53%, little changed since the 56% on 03/23/2016 but slightly decreased from the 58% on 01/01/2016.      09/07/2016 Echocardiogram    MUGA- Normal LEFT ventricular ejection fraction of 59% with normal LV wall motion.      09/07/2016 Treatment Plan Change    Xeloda dose reduced to 1500 mg BID 7 days on and 7 days off.      09/15/2016 PET scan    No significant change in diffuse sclerotic bone metastases on CT images, although several show mildly increased FDG uptake since prior study.  No evidence of soft tissue metastatic disease.      11/02/2016 Imaging    MRI brain- No change from the  prior study. Previously noted metastatic deposits have been treated. No new lesions identified.        INTERVAL HISTORY:  Ms. Kendle 51 y.o. female presents for continued follow-up for metastatic breast cancer.   Here today with her husband.   Overall, she tells me she has been feeling pretty well.  She is consistently using the CPAP and has noticed that she is sleeping better and daytime fatigue is improving.  She saw Dr. Daron Offer today with psychiatry and she tells me that he is happy with the improvement he has seen in her mood/affect.    She has intermittent nausea, hot flashes, weakness, and dizziness. These symptoms are chronic and largely unchanged.  She does share with me that she did have a fall in the shower a few days ago and her left hip has been hurting a little worse since that time. States that she slipped and lost her footing; denies hitting her head or significant trauma.    Remains on Tykerb and Xeloda as directed. Denies any missed doses. Here for next dose of monthly Zometa.   Requesting refills of EMLA cream for her port-a-cath, MS Contin, Ritalin, and Pristiq.        REVIEW OF SYSTEMS:  Review of Systems - Oncology, per HPI. Otherwise, 12-point ROS completed and negative.      PAST MEDICAL/SURGICAL HISTORY:  Past Medical History:  Diagnosis Date  . Anticoagulated    xarelto  . Anxiety   . Breast cancer metastasized to multiple sites [facility])    liver, brain, and bone  . Breast cancer, stage 4 [facility]) oncologist-  dr Larene [place] ([facility])   dx 12/ 2015 -- breast cancer Stage 4,  ER/HER2 +,  w/  liver, brain and  bone mets/  chemotherapy and radiation therapy  . Chronic pain syndrome    secondary to cancer   . Depression   . Drug-induced cardiomyopathy ([place])    per last  MUGA (08-08-2015), ef 56.5/ per last echo 05-18-2014 ef 60-65%  . Family history of prostate cancer   . GERD (gastroesophageal reflux disease)   . History of colon polyps     07-13-2013  benign  . History of DVT (deep vein thrombosis)    07-09-2014  upper right extremity-  RIJ and right subclavian--  resolved  . History of gastritis    erosive  . History of pneumonia    HCAP 06-07-2015--  resolved per cxr 07-04-2015  . History of radiation therapy    12 fractions to L3 - S3, 30Gy and left spacula 20Gy (06-20-2014 to 07-10-2014) //  whole brain rxt (10-09-2014 to 10-26-2014)  . History of small bowel obstruction    S/P RESECTION 2008  . Migraine   . PONV (postoperative nausea and vomiting)    pt states scope patch does well   Past Surgical History:  Procedure Laterality Date  . BREAST REDUCTION SURGERY  03/17/2011   Procedure: MAMMARY REDUCTION BILATERAL (BREAST);  Surgeon: Mary A Contogiannis;  Location: [place];  Service: Plastics;  Laterality: Bilateral;  . CATARACT EXTRACTION W/ INTRAOCULAR LENS  IMPLANT, BILATERAL  2008  . CERVICAL FUSION  2003   C5 -- C6  . COLONOSCOPY N/A 07/13/2013   Procedure: COLONOSCOPY;  Surgeon: Rogene Houston, MD;  [facility];  Service: Endoscopy;  Laterality: N/A;  930  . COLONOSCOPY N/A 11/26/2014   Procedure: COLONOSCOPY;  Surgeon: Rogene Houston, MD;  [facility];  Service: Endoscopy;  Laterality: N/A;  730  . D & C HYSTEROSCOPY/  RESECTION ENDOMETRIAL MASS/  [place] ENDOMETRIAL ABLATION  04-11-2010  . DX LAPAROSCOPY W/ PARTIAL SMALL BOWEL RESECTION AND APPENDECTOMY  04-13-2007  . ESOPHAGOGASTRODUODENOSCOPY N/A 05/25/2014   Procedure: ESOPHAGOGASTRODUODENOSCOPY (EGD);  Surgeon: Rogene Houston, MD;  [facility];  Service: Endoscopy;  Laterality: N/A;  155  . KNEE ARTHROSCOPY Right 2005  . LAPAROSCOPIC ASSISTED VAGINAL HYSTERECTOMY  10-13-2010   w/ Bx Left Fallopian tube and Aspiration Right Ovarian Cyst  . LAPAROSCOPIC CHOLECYSTECTOMY  11-17-2002  . LAPAROSCOPIC SALPINGO OOPHERECTOMY Bilateral 08/26/2015   Procedure: LAPAROSCOPIC SALPINGO OOPHORECTOMY, bilateral;   Surgeon: Everlene Farrier, MD;  Location: [facility];  Service: Gynecology;  Laterality: Bilateral;  . PORTACATH PLACEMENT  05-17-2014  . REDUCTION MAMMAPLASTY    . SHOULDER ARTHROSCOPY WITH ROTATOR CUFF REPAIR Right 2002  . TRANSTHORACIC ECHOCARDIOGRAM  05-18-2014   ef 60-65%//   last MUGA  (08-08-2015)  ef 56.6%       SOCIAL HISTORY:  Social History   Socioeconomic History  . Marital status: Married    Spouse name: Not on file  . Number of children: Not on file  . Years of education: Not on file  . Highest education level: Not on file  Social Needs  . Financial resource strain: Not on file  . Food insecurity - worry: Not on file  . Food insecurity - inability: Not on file  . Transportation needs - medical: Not on file  . Transportation needs - non-medical: Not on file  Occupational History  . Not on file  Tobacco Use  . Smoking status: Former Smoker    Types: Cigarettes    Last attempt to quit: 08/20/1994    Years since quitting: 22.7  . Smokeless tobacco: Never Used  Substance and Sexual Activity  . Alcohol use: Yes    Comment: Occasionally  . Drug use: No  . Sexual activity: Yes  Partners: Male    Birth control/protection: Surgical, Condom  Other Topics Concern  . Not on file  Social History Narrative  . Not on file    FAMILY HISTORY:  Family History  Problem Relation Age of Onset  . Diabetes Father   . Heart attack Maternal Grandmother 30       multiple over lifetime.  . Cancer Maternal Grandmother 81       NOS  . Prostate cancer Maternal Grandfather        dx in his 14s  . Lung cancer Paternal Grandfather        dx <50  . Lymphoma Maternal Aunt        dx in her 83s  . Melanoma Cousin 16       maternal first cousin  . Brain cancer Cousin        paternal first cousin dx under 73  . Prostate cancer Other        MGF's father  . Colon cancer Other        MGM's mother    CURRENT MEDICATIONS:  Outpatient Encounter Medications as of  04/28/2017  Medication Sig  . acetaminophen (TYLENOL) 325 MG tablet Take 650 mg by mouth every 6 (six) hours as needed for moderate pain or headache.  . calcium carbonate (TUMS - DOSED IN MG ELEMENTAL CALCIUM) 500 MG chewable tablet Chew 1 tablet by mouth daily.  . Calcium-Phosphorus-Vitamin D (CALCIUM/D3 ADULT GUMMIES PO) Take 2 tablets by mouth 2 (two) times daily. dosage of calcium '1200mg'$  and vitamin d '1000mg'$   . desvenlafaxine (PRISTIQ) 100 MG 24 hr tablet Take 1 tablet (100 mg total) by mouth daily.  . Difluprednate (DUREZOL) 0.05 % EMUL Apply to eye 2 (two) times daily.  . diphenhydrAMINE (BENADRYL) 25 mg capsule Take 25 mg by mouth every 6 (six) hours as needed for itching.  . furosemide (LASIX) 20 MG tablet Take 20 mg by mouth 2 (two) times daily.  Marland Kitchen gabapentin (NEURONTIN) 300 MG capsule Take 2 capsules (600 mg total) by mouth at bedtime.  Marland Kitchen ketorolac (ACULAR) 0.4 % SOLN INSTILL ONE DROP IN THE RIGHT EYE FOUR TIMES DAILY  . lapatinib (TYKERB) 250 MG tablet Take 5 tablets (1,250 mg total) by mouth daily.  Marland Kitchen lidocaine-prilocaine (EMLA) cream Apply 1 application topically daily as needed (apply to port before chemo).  . LORazepam (ATIVAN) 0.5 MG tablet Take 1 tablet (0.5 mg total) by mouth every 6 (six) hours as needed for anxiety.  . magic mouthwash SOLN Swish and swallow 35m four times a day.  . Melatonin 10 MG CAPS Take 1 tablet by mouth. Pt states she takes one tablet at night for sleep  . methylphenidate (RITALIN) 20 MG tablet Take 1 tablet (20 mg total) by mouth 2 (two) times daily with breakfast and lunch.  . morphine (MS CONTIN) 30 MG 12 hr tablet Take 1 tablet (30 mg total) by mouth every 12 (twelve) hours.  .Marland KitchenNARCAN 4 MG/0.1ML LIQD nasal spray kit CALL 911. ADMINISTER A SINGLE SPRAY OF NARCAN IN ONE NOSTRIL, REPEAT EVERY 3 MINUTES AS NEEDED IF NO OR MINIMAL RESPONSE  . ondansetron (ZOFRAN) 8 MG tablet Take 1 tablet (8 mg total) by mouth every 8 (eight) hours as needed for nausea or  vomiting.  .Marland KitchenoxyCODONE (OXY IR/ROXICODONE) 5 MG immediate release tablet Take 1-2 tablets (5-10 mg total) by mouth every 4 (four) hours as needed for moderate pain.  . pantoprazole (PROTONIX) 40 MG tablet Take 1 tablet (40  mg total) by mouth every 12 (twelve) hours.  . potassium chloride SA (K-DUR,KLOR-CON) 20 MEQ tablet Take 1 tablet (20 mEq total) by mouth 3 (three) times daily.  . promethazine (PHENERGAN) 25 MG tablet TAKE 1 TABLET BY MOUTH EVERY SIX HOURS AS NEEDED FOR NAUSEA OR VOMITING  . rivaroxaban (XARELTO) 20 MG TABS tablet TAKE 1 TABLET BY MOUTH EVERY DAY WITH SUPPER  . spironolactone (ALDACTONE) 25 MG tablet Take 1 tablet (25 mg total) by mouth daily.  . XELODA 500 MG tablet Take 1500 mg PO BID, 7 days on and 7 days off  . Zoledronic Acid (ZOMETA) 4 MG/100ML IVPB Inject 100 mLs (4 mg total) into the vein every 30 (thirty) days.  Marland Kitchen zolpidem (AMBIEN) 10 MG tablet TAKE ONE TABLET BY MOUTH AT BEDTIME AS NEEDED  . [DISCONTINUED] lidocaine-prilocaine (EMLA) cream Apply 1 application topically daily as needed (apply to port before chemo).  . [DISCONTINUED] morphine (MS CONTIN) 30 MG 12 hr tablet Take 1 tablet (30 mg total) by mouth every 12 (twelve) hours.  . [DISCONTINUED] desvenlafaxine (PRISTIQ) 100 MG 24 hr tablet Take 1 tablet (100 mg total) by mouth daily.  . [DISCONTINUED] methylphenidate (RITALIN) 20 MG tablet TAKE ONE TABLET BY MOUTH TWICE DAILY - ONE EVERY MORNING & ONE AT LUNCH / BEFORE 1PM   No facility-administered encounter medications on file as of 04/28/2017.     ALLERGIES:  No Known Allergies   PHYSICAL EXAM:  ECOG Performance status: 1 - Symptomatic, but independent.       Physical Exam  Constitutional: She is oriented to person, place, and time and well-developed, well-nourished, and in no distress.  Seen in chemo chair in infusion area   HENT:  Head: Normocephalic.  Mouth/Throat: Oropharynx is clear and moist.  Eyes: Conjunctivae are normal. No scleral  icterus.  Neck: Normal range of motion. Neck supple.  Cardiovascular: Normal rate and regular rhythm.  Pulmonary/Chest: Effort normal and breath sounds normal. No respiratory distress.  Abdominal: Soft. Bowel sounds are normal. There is no tenderness.  Musculoskeletal: Normal range of motion. She exhibits no edema.  Lymphadenopathy:    She has no cervical adenopathy.       Right: No supraclavicular adenopathy present.       Left: No supraclavicular adenopathy present.  Neurological: She is alert and oriented to person, place, and time. No cranial nerve deficit.  Skin: Skin is warm and dry. No rash noted.  Psychiatric: Mood, memory, affect and judgment normal.  Nursing note and vitals reviewed.    LABORATORY DATA:  I have reviewed the labs as listed.  CBC    Component Value Date/Time   WBC 5.9 04/28/2017 1330   RBC 4.05 04/28/2017 1330   HGB 11.0 (L) 04/28/2017 1330   HCT 36.5 04/28/2017 1330   PLT 247 04/28/2017 1330   MCV 90.1 04/28/2017 1330   MCH 27.2 04/28/2017 1330   MCHC 30.1 04/28/2017 1330   RDW 17.8 (H) 04/28/2017 1330   LYMPHSABS 1.1 04/28/2017 1330   MONOABS 0.4 04/28/2017 1330   EOSABS 0.0 04/28/2017 1330   BASOSABS 0.0 04/28/2017 1330   CMP Latest Ref Rng & Units 04/28/2017 03/31/2017 03/03/2017  Glucose 65 - 99 mg/dL 128(H) 105(H) 131(H)  BUN 6 - 20 mg/dL '13 7 9  '$ Creatinine 0.44 - 1.00 mg/dL 0.86 0.87 0.85  Sodium 135 - 145 mmol/L 138 135 140  Potassium 3.5 - 5.1 mmol/L 3.7 3.6 3.7  Chloride 101 - 111 mmol/L 100(L) 98(L) 102  CO2 22 -  32 mmol/L '26 29 28  '$ Calcium 8.9 - 10.3 mg/dL 9.3 9.4 9.6  Total Protein 6.5 - 8.1 g/dL 7.2 6.9 7.2  Total Bilirubin 0.3 - 1.2 mg/dL 0.3 0.8 0.5  Alkaline Phos 38 - 126 U/L 76 94 94  AST 15 - 41 U/L '24 30 24  '$ ALT 14 - 54 U/L '17 21 18         '$ PENDING LABS:     DIAGNOSTIC IMAGING:  Most recent PET scan: 03/24/17 CLINICAL DATA:  Subsequent treatment strategy for metastatic breast cancer.  EXAM: NUCLEAR  MEDICINE PET SKULL BASE TO THIGH  TECHNIQUE: 13.0 mCi F-18 FDG was injected intravenously. Full-ring PET imaging was performed from the skull base to thigh after the radiotracer. CT data was obtained and used for attenuation correction and anatomic localization.  FASTING BLOOD GLUCOSE:  Value: 110 mg/dl  COMPARISON:  01/20/2017  FINDINGS: NECK: No hypermetabolic lymph nodes in the neck.  CHEST: No hypermetabolic mediastinal or hilar nodes. No suspicious pulmonary nodules on the CT scan.  ABDOMEN/PELVIS: No abnormal hypermetabolic activity within the liver, pancreas, adrenal glands, or spleen. No hypermetabolic lymph nodes in the abdomen or pelvis.  SKELETON: Multifocal hypermetabolic skeletal metastases are again noted. The index lesion within the right glenoid has an SUV max equal to 15.9. Previously 12.3. The T7 lesion within the T7 vertebra has an SUV max equal to 7.95. Previously 5.7. Hypermetabolic lesion involving the T10 vertebra and extending into the left posterior elements has an SUV max of 11.82. Previously 8.3. Index lesion within the left iliac crest has an SUV max equal to 15.27. Previously 16.86. Hypermetabolic lesion within the right hip has an SUV max of 17.06. Previously 16.23.  IMPRESSION: 1. No evidence for hypermetabolic soft tissue metastases. 2. No significant change and hypermetabolic bone metastasis with some mild variability in degree of FDG uptake.   Electronically Signed   By: Kerby Moors M.D.   On: 03/24/2017 15:49     Most recent ECHO: 04/27/17  *[place], [place] [address]                            [phone]  ------------------------------------------------------------------- Transthoracic Echocardiography  Patient:    Emojean, Gertz MR #:       [id number] Study Date: 04/27/2017 Gender:     F Age:        51 Height:      172.7 cm Weight:     117.5 kg BSA:        2.43 m^2 Pt. Status: Room:   SONOGRAPHER  [facility]  PERFORMING   [facility], Forestine Na  ATTENDING    Laurelyn Sickle, Elzie Rings W  REFERRING    Mike Craze W  cc:  ------------------------------------------------------------------- LV EF: 60% -   65%  ------------------------------------------------------------------- History:   PMH:  Metastatic Breast Cancer, Drug Induced Cardiomyopathy, Bone and Brain Metastases, Former Smoker.  ------------------------------------------------------------------- Study Conclusions  - Left ventricle: The cavity size was normal. Wall thickness was   increased in a pattern of mild LVH. Systolic function was normal.   The estimated ejection  fraction was in the range of 60% to 65%.   Normal global longitudinal strain of -22.5%. Wall motion was   normal; there were no regional wall motion abnormalities. Left   ventricular diastolic function parameters were normal. - Aortic valve: Mildly calcified annulus. Trileaflet. - Right atrium: Central venous pressure (est): 3 mm Hg. - Atrial septum: No defect or patent foramen ovale was identified. - Tricuspid valve: There was trivial regurgitation. - Pulmonary arteries: Systolic pressure could not be accurately   estimated. - Pericardium, extracardiac: There was no pericardial effusion.  Impressions:  - Mild LVH with LVEF 60-65% and normal diastolic function. Normal   global longitudinal strain of -22.5%. Mildly calcified aortic   annulus. Trivial tricuspid regurgitation.     MRI brain: 11/02/16 CLINICAL DATA:  Metastatic breast cancer. Whole-brain radiation and chemotherapy  EXAM: MRI HEAD WITHOUT AND WITH CONTRAST  TECHNIQUE: Multiplanar, multiecho pulse sequences of the brain and surrounding structures were obtained without and with intravenous contrast.  CONTRAST:  3m MULTIHANCE GADOBENATE DIMEGLUMINE  529 MG/ML IV SOLN  COMPARISON:  MRI 05/19/2016  FINDINGS: Brain: Negative for enhancing metastatic deposits in the brain. Stable 3 mm T2 hyperintensity in the head of the caudate on the right is unchanged compatible with treated tumor. Other metastatic deposit is no longer enhance. No new lesions identified.  Ventricle size normal. Negative for acute infarct. Mild chronic white matter changes stable. Negative for hemorrhage or mass.  Vascular: Normal arterial flow void  Skull and upper cervical spine: Negative  Sinuses/Orbits: Negative  Other: None  IMPRESSION: No change from the prior study. Previously noted metastatic deposits have been treated. No new lesions identified.   Electronically Signed   By: CFranchot GalloM.D.   On: 11/02/2016 17:02     PATHOLOGY:  [employer]: 05/25/14           ASSESSMENT & PLAN:   Stage IV metastatic breast adenocarcinoma with liver, bone, brain, and spine mets; ER+/HER2+: -Currently taking Xeloda 1500 mg BID 7 days on and 7 days off with Tykerb daily.  -Tumor markers (CA 27-29 & CA 15-3) have been slowly increasing over time.  Most recent PET scan results from 03/24/17 reviewed in detail with patient and her husband today. Overall, her disease continues to remain stable without evidence of any visceral/soft tissue mets; bony lesions are largely unchanged/stable with varying degrees of hypermetabolism on PET.   Will plan to restage with PET scan in 07/2017; will place orders at subsequent follow-up visits.  -ECHO on 04/27/17 showed EF 60-65%.  She will be due for next cardiac evaluation given potential cardiotoxicity of Tykerb therapy in 07/2017.  -Continue current treatment with Xeloda and Tykerb as prescribed.    -Return to cancer center in 1 month for follow-up with next Zometa infusion. She prefers to be seen monthly.    Fatigue/Sleep disturbance:  -Sleep study completed on 02/23/17 (ordered by psychiatry), showed  severe sleep apnea.  She has been using CPAP machine for the past couple of weeks and has seen improvement in her symptoms.  -Continue Ambien at bedtime as needed.   Hypokalemia/Chronic LE edema:  -Hypokalemia resolved. Continue Aldactone for diuresis.   Bone mets:  -Continue monthly Zometa. Due for next dose today; serum calcium adequate for infusion today as scheduled.   Cancer-related fatigue & Depression/Anxiety:  -Continue current medication regimen per psychiatry with Pristiq daily and Ativan PRN.  -Continue follow-up with psychiatry as directed. -Continue Ritalin 20 mg BID, although she historically only takes once per  day.  She generally does not take the medication BID, but it is helpful with first dose in the mornings.    In the future, I would be reluctant to further increase the dose given tachycardia and possibility of cardiotoxicity with Tykerb. -Dr. Daron Offer with psychiatry recently refilled Ritalin & Pristiq for patient. I am happy to have Dr. Joycelyn Schmid team assume responsibility of managing her fatigue and depression medications.   Chronic pain secondary to neoplasm:  -MS Contin effective in managing her pain.  Maintain current dose of MS Contin for basal pain control with oxycodone PRN. Bowels reportedly moving well.  Requesting refill of MS Contin; [place] Controlled Substance Reporting System reviewed and refill appropriate within the next few days; paper prescription given to patient today.           Dispo:  -Return to cancer center in 1 month for follow-up with next Zometa infusion.     All questions were answered to patient's stated satisfaction. Encouraged patient to call with any new concerns or questions before her next visit to the cancer center and we can certain see her sooner, if needed.    Plan of care discussed with Dr. Talbert Cage, who agrees with the above aforementioned.    Orders placed this encounter:  No orders of the defined types were placed in this  encounter.     Mike Craze, NP [place] [phone]

## 2017-04-27 NOTE — Progress Notes (Signed)
*  PRELIMINARY RESULTS* Echocardiogram 2D Echocardiogram has been performed.  Leavy Cella 04/27/2017, 1:00 PM

## 2017-04-28 ENCOUNTER — Ambulatory Visit (HOSPITAL_COMMUNITY): Payer: Self-pay

## 2017-04-28 ENCOUNTER — Encounter (HOSPITAL_COMMUNITY): Payer: Self-pay | Admitting: Psychiatry

## 2017-04-28 ENCOUNTER — Encounter (HOSPITAL_BASED_OUTPATIENT_CLINIC_OR_DEPARTMENT_OTHER): Payer: 59

## 2017-04-28 ENCOUNTER — Ambulatory Visit (INDEPENDENT_AMBULATORY_CARE_PROVIDER_SITE_OTHER): Payer: Medicare Other | Admitting: Psychiatry

## 2017-04-28 ENCOUNTER — Encounter (HOSPITAL_COMMUNITY): Payer: 59 | Attending: Oncology | Admitting: Adult Health

## 2017-04-28 ENCOUNTER — Other Ambulatory Visit: Payer: Self-pay

## 2017-04-28 ENCOUNTER — Ambulatory Visit (HOSPITAL_COMMUNITY): Payer: Self-pay | Admitting: Adult Health

## 2017-04-28 ENCOUNTER — Other Ambulatory Visit (HOSPITAL_COMMUNITY): Payer: Self-pay

## 2017-04-28 ENCOUNTER — Encounter (HOSPITAL_COMMUNITY): Payer: Self-pay | Admitting: Adult Health

## 2017-04-28 VITALS — BP 115/48 | HR 66 | Temp 98.2°F | Resp 18

## 2017-04-28 DIAGNOSIS — Z87891 Personal history of nicotine dependence: Secondary | ICD-10-CM

## 2017-04-28 DIAGNOSIS — R110 Nausea: Secondary | ICD-10-CM | POA: Diagnosis not present

## 2017-04-28 DIAGNOSIS — C229 Malignant neoplasm of liver, not specified as primary or secondary: Secondary | ICD-10-CM | POA: Diagnosis present

## 2017-04-28 DIAGNOSIS — F418 Other specified anxiety disorders: Secondary | ICD-10-CM

## 2017-04-28 DIAGNOSIS — R530 Neoplastic (malignant) related fatigue: Secondary | ICD-10-CM

## 2017-04-28 DIAGNOSIS — C50919 Malignant neoplasm of unspecified site of unspecified female breast: Secondary | ICD-10-CM

## 2017-04-28 DIAGNOSIS — R5382 Chronic fatigue, unspecified: Secondary | ICD-10-CM

## 2017-04-28 DIAGNOSIS — M79676 Pain in unspecified toe(s): Secondary | ICD-10-CM

## 2017-04-28 DIAGNOSIS — Z9181 History of falling: Secondary | ICD-10-CM | POA: Diagnosis not present

## 2017-04-28 DIAGNOSIS — N951 Menopausal and female climacteric states: Secondary | ICD-10-CM | POA: Diagnosis not present

## 2017-04-28 DIAGNOSIS — G893 Neoplasm related pain (acute) (chronic): Secondary | ICD-10-CM

## 2017-04-28 DIAGNOSIS — C7951 Secondary malignant neoplasm of bone: Secondary | ICD-10-CM

## 2017-04-28 DIAGNOSIS — G4730 Sleep apnea, unspecified: Secondary | ICD-10-CM

## 2017-04-28 DIAGNOSIS — Z0000 Encounter for general adult medical examination without abnormal findings: Secondary | ICD-10-CM

## 2017-04-28 DIAGNOSIS — C7931 Secondary malignant neoplasm of brain: Secondary | ICD-10-CM

## 2017-04-28 DIAGNOSIS — C787 Secondary malignant neoplasm of liver and intrahepatic bile duct: Secondary | ICD-10-CM

## 2017-04-28 LAB — CBC WITH DIFFERENTIAL/PLATELET
BASOS ABS: 0 10*3/uL (ref 0.0–0.1)
BASOS PCT: 0 %
EOS ABS: 0 10*3/uL (ref 0.0–0.7)
Eosinophils Relative: 0 %
HEMATOCRIT: 36.5 % (ref 36.0–46.0)
Hemoglobin: 11 g/dL — ABNORMAL LOW (ref 12.0–15.0)
Lymphocytes Relative: 18 %
Lymphs Abs: 1.1 10*3/uL (ref 0.7–4.0)
MCH: 27.2 pg (ref 26.0–34.0)
MCHC: 30.1 g/dL (ref 30.0–36.0)
MCV: 90.1 fL (ref 78.0–100.0)
MONO ABS: 0.4 10*3/uL (ref 0.1–1.0)
MONOS PCT: 7 %
NEUTROS ABS: 4.4 10*3/uL (ref 1.7–7.7)
NEUTROS PCT: 75 %
Platelets: 247 10*3/uL (ref 150–400)
RBC: 4.05 MIL/uL (ref 3.87–5.11)
RDW: 17.8 % — AB (ref 11.5–15.5)
WBC: 5.9 10*3/uL (ref 4.0–10.5)

## 2017-04-28 LAB — COMPREHENSIVE METABOLIC PANEL
ALBUMIN: 3.8 g/dL (ref 3.5–5.0)
ALT: 17 U/L (ref 14–54)
ANION GAP: 12 (ref 5–15)
AST: 24 U/L (ref 15–41)
Alkaline Phosphatase: 76 U/L (ref 38–126)
BUN: 13 mg/dL (ref 6–20)
CHLORIDE: 100 mmol/L — AB (ref 101–111)
CO2: 26 mmol/L (ref 22–32)
CREATININE: 0.86 mg/dL (ref 0.44–1.00)
Calcium: 9.3 mg/dL (ref 8.9–10.3)
GFR calc non Af Amer: >60 mL/min (ref >60)
GLUCOSE: 128 mg/dL — AB (ref 65–99)
Potassium: 3.7 mmol/L (ref 3.5–5.1)
SODIUM: 138 mmol/L (ref 135–145)
Total Bilirubin: 0.3 mg/dL (ref 0.3–1.2)
Total Protein: 7.2 g/dL (ref 6.5–8.1)

## 2017-04-28 MED ORDER — METHYLPHENIDATE HCL 20 MG PO TABS
20.0000 mg | ORAL_TABLET | Freq: Two times a day (BID) | ORAL | 0 refills | Status: DC
Start: 2017-04-28 — End: 2017-08-20

## 2017-04-28 MED ORDER — LIDOCAINE-PRILOCAINE 2.5-2.5 % EX CREA: 1.0000 "application " | TOPICAL_CREAM | Freq: Every day | CUTANEOUS | 2 refills | Status: AC | PRN

## 2017-04-28 MED ORDER — MORPHINE SULFATE ER 30 MG PO TBCR: 30.0000 mg | EXTENDED_RELEASE_TABLET | Freq: Two times a day (BID) | ORAL | 0 refills | Status: DC

## 2017-04-28 MED ORDER — DESVENLAFAXINE SUCCINATE ER 100 MG PO TB24: 100.0000 mg | ORAL_TABLET | Freq: Every day | ORAL | 1 refills | Status: DC

## 2017-04-28 MED ORDER — ALTEPLASE 2 MG IJ SOLR: 2.0000 mg | Freq: Once | INTRAMUSCULAR | Status: DC | PRN

## 2017-04-28 MED ORDER — ZOLEDRONIC ACID 4 MG/100ML IV SOLN
4.0000 mg | Freq: Once | INTRAVENOUS | Status: AC
  Administered 2017-04-28: 4 mg via INTRAVENOUS
  Filled 2017-04-28: qty 100

## 2017-04-28 MED ORDER — HEPARIN SOD (PORK) LOCK FLUSH 100 UNIT/ML IV SOLN
500.0000 [IU] | Freq: Once | INTRAVENOUS | Status: AC | PRN
  Administered 2017-04-28: 500 [IU]

## 2017-04-28 MED ORDER — SODIUM CHLORIDE 0.9 % IV SOLN
Freq: Once | INTRAVENOUS | Status: AC
  Administered 2017-04-28: 15:00:00 via INTRAVENOUS

## 2017-04-28 NOTE — Progress Notes (Signed)
BH MD/PA/NP OP Progress Note  04/28/2017 10:58 AM [name]  MRN:  [id number]  Chief Complaint: 95% better  HPI: .  Patient reports that she has been using her CPAP about 95% of the time.  She reports that the only reason she sometimes does not use it falls asleep on the couch watching TV which is rare.  She reports that she has had a dramatic improvement in her mood, energy, reduction of fatigue, depression has improved, and she feels about 95% better.  She appears awake and alert, and engaged in the conversation.  She continues Pristiq and Ritalin once a day, and has used Ativan rarely for anxiety or nausea.  I spent time celebrating this success for her.  I spent time again reiterating and educating her about sleep apnea and how this affects metabolism, cardiovascular function, mood, cognition, energy, and she reports that this has dramatically improved her quality of life, and she has made sure to let her family and friends know how important it is to treat sleep apnea.   Patient and I spent time discussing her recent attendance of her son's wedding, and it was overall a very positive experience.  She noticed that she was able to tolerate frustrations much better, and was able to thoroughly enjoy the wedding.  She is currently continuing the chemotherapy.  Patient agrees to follow-up with this writer in 6 months or sooner if needed.  We agreed that ECT is no longer necessary at this time.  Visit Diagnosis:    ICD-10-CM   1. Breast cancer, stage 4, unspecified laterality (HCC) C50.919 desvenlafaxine (PRISTIQ) 100 MG 24 hr tablet    methylphenidate (RITALIN) 20 MG tablet  2. Metastatic breast cancer (HCC) C50.919 desvenlafaxine (PRISTIQ) 100 MG 24 hr tablet  3. Depression with anxiety F41.8 desvenlafaxine (PRISTIQ) 100 MG 24 hr tablet  4. Chronic fatigue R53.82 methylphenidate (RITALIN) 20 MG tablet    Past Psychiatric History: See intake H&P for full details. Reviewed, with no updates  at this time.   Past Medical History:  Past Medical History:  Diagnosis Date  . Anticoagulated    xarelto  . Anxiety   . Breast cancer metastasized to multiple sites [facility])    liver, brain, and bone  . Breast cancer, stage 4 [facility]) oncologist-  dr Larene [place] ([facility])   dx 12/ 2015 -- breast cancer Stage 4,  ER/HER2 +,  w/  liver, brain and  bone mets/  chemotherapy and radiation therapy  . Chronic pain syndrome    secondary to cancer   . Depression   . 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 Family history of prostate cancer   . GERD (gastroesophageal reflux disease)   . History of colon polyps    07-13-2013  benign  . History of DVT (deep vein thrombosis)    07-09-2014  upper right extremity-  RIJ and right subclavian--  resolved  . History of gastritis    erosive  . History of pneumonia    HCAP 06-07-2015--  resolved per cxr 07-04-2015  . History of radiation therapy    12 fractions to L3 - S3, 30Gy and left spacula 20Gy (06-20-2014 to 07-10-2014) //  whole brain rxt (10-09-2014 to 10-26-2014)  . History of small bowel obstruction    S/P RESECTION 2008  . Migraine   . PONV (postoperative nausea and vomiting)    pt states scope patch  does well    Past Surgical History:  Procedure Laterality Date  . BREAST REDUCTION SURGERY  03/17/2011   Procedure: MAMMARY REDUCTION BILATERAL (BREAST);  Surgeon: Mary A Contogiannis;  Location: [place];  Service: Plastics;  Laterality: Bilateral;  . CATARACT EXTRACTION W/ INTRAOCULAR LENS  IMPLANT, BILATERAL  2008  . CERVICAL FUSION  2003   C5 -- C6  . COLONOSCOPY N/A 07/13/2013   Procedure: COLONOSCOPY;  Surgeon: Rogene Houston, MD;  [facility];  Service: Endoscopy;  Laterality: N/A;  930  . COLONOSCOPY N/A 11/26/2014   Procedure: COLONOSCOPY;  Surgeon: Rogene Houston, MD;  [facility];  Service: Endoscopy;   Laterality: N/A;  730  . D & C HYSTEROSCOPY/  RESECTION ENDOMETRIAL MASS/  [place] ENDOMETRIAL ABLATION  04-11-2010  . DX LAPAROSCOPY W/ PARTIAL SMALL BOWEL RESECTION AND APPENDECTOMY  04-13-2007  . ESOPHAGOGASTRODUODENOSCOPY N/A 05/25/2014   Procedure: ESOPHAGOGASTRODUODENOSCOPY (EGD);  Surgeon: Rogene Houston, MD;  [facility];  Service: Endoscopy;  Laterality: N/A;  155  . KNEE ARTHROSCOPY Right 2005  . LAPAROSCOPIC ASSISTED VAGINAL HYSTERECTOMY  10-13-2010   w/ Bx Left Fallopian tube and Aspiration Right Ovarian Cyst  . LAPAROSCOPIC CHOLECYSTECTOMY  11-17-2002  . LAPAROSCOPIC SALPINGO OOPHERECTOMY Bilateral 08/26/2015   Procedure: LAPAROSCOPIC SALPINGO OOPHORECTOMY, bilateral;  Surgeon: Everlene Farrier, MD;  Location: [place];  Service: Gynecology;  Laterality: Bilateral;  . PORTACATH PLACEMENT  05-17-2014  . REDUCTION MAMMAPLASTY    . SHOULDER ARTHROSCOPY WITH ROTATOR CUFF REPAIR Right 2002  . TRANSTHORACIC ECHOCARDIOGRAM  05-18-2014   ef 60-65%//   last MUGA  (08-08-2015)  ef 56.6%      Family Psychiatric History: See intake H&P for full details. Reviewed, with no updates at this time.   Family History:  Family History  Problem Relation Age of Onset  . Diabetes Father   . Heart attack Maternal Grandmother 30       multiple over lifetime.  . Cancer Maternal Grandmother 80       NOS  . Prostate cancer Maternal Grandfather        dx in his 5s  . Lung cancer Paternal Grandfather        dx <50  . Lymphoma Maternal Aunt        dx in her 87s  . Melanoma Cousin 66       maternal first cousin  . Brain cancer Cousin        paternal first cousin dx under 6  . Prostate cancer Other        MGF's father  . Colon cancer Other        MGM's mother    Social History:  Social History   Socioeconomic History  . Marital status: Married    Spouse name: None  . Number of children: None  . Years of education: None  . Highest education level: None   Social Needs  . Financial resource strain: None  . Food insecurity - worry: None  . Food insecurity - inability: None  . Transportation needs - medical: None  . Transportation needs - non-medical: None  Occupational History  . None  Tobacco Use  . Smoking status: Former Smoker    Types: Cigarettes    Last attempt to quit: 08/20/1994    Years since quitting: 22.7  . Smokeless tobacco: Never Used  Substance and Sexual Activity  . Alcohol use: Yes    Comment: Occasionally  . Drug use:  No  . Sexual activity: Yes    Partners: Male    Birth control/protection: Surgical, Condom  Other Topics Concern  . None  Social History Narrative  . None    Allergies: No Known Allergies  Metabolic Disorder Labs: No results found for: HGBA1C, MPG No results found for: PROLACTIN No results found for: CHOL, TRIG, HDL, CHOLHDL, VLDL, LDLCALC Lab Results  Component Value Date   TSH 2.519 04/17/2015   TSH 0.774 12/24/2014    Therapeutic Level Labs: No results found for: LITHIUM No results found for: VALPROATE No components found for:  CBMZ  Current Medications: Current Outpatient Medications  Medication Sig Dispense Refill  . acetaminophen (TYLENOL) 325 MG tablet Take 650 mg by mouth every 6 (six) hours as needed for moderate pain or headache.    . calcium carbonate (TUMS - DOSED IN MG ELEMENTAL CALCIUM) 500 MG chewable tablet Chew 1 tablet by mouth daily.    . Calcium-Phosphorus-Vitamin D (CALCIUM/D3 ADULT GUMMIES PO) Take 2 tablets by mouth 2 (two) times daily. dosage of calcium 1293m and vitamin d 100670m   . desvenlafaxine (PRISTIQ) 100 MG 24 hr tablet Take 1 tablet (100 mg total) by mouth daily. 90 tablet 1  . Difluprednate (DUREZOL) 0.05 % EMUL Apply to eye 2 (two) times daily.    . diphenhydrAMINE (BENADRYL) 25 mg capsule Take 25 mg by mouth every 6 (six) hours as needed for itching.    . gabapentin (NEURONTIN) 300 MG capsule Take 2 capsules (600 mg total) by mouth at bedtime. 60  capsule 5  . lapatinib (TYKERB) 250 MG tablet Take 5 tablets (1,250 mg total) by mouth daily. 140 tablet 1  . lidocaine-prilocaine (EMLA) cream Apply 1 application topically daily as needed (apply to port before chemo). 30 g 2  . LORazepam (ATIVAN) 0.5 MG tablet Take 1 tablet (0.5 mg total) by mouth every 6 (six) hours as needed for anxiety. 30 tablet 1  . magic mouthwash SOLN Swish and swallow 70m61mour times a day. 480 mL 3  . Melatonin 10 MG CAPS Take 1 tablet by mouth. Pt states she takes one tablet at night for sleep    . methylphenidate (RITALIN) 20 MG tablet Take 1 tablet (20 mg total) by mouth 2 (two) times daily with breakfast and lunch. 60 tablet 0  . morphine (MS CONTIN) 30 MG 12 hr tablet Take 1 tablet (30 mg total) by mouth every 12 (twelve) hours. 60 tablet 0  . ondansetron (ZOFRAN) 8 MG tablet Take 1 tablet (8 mg total) by mouth every 8 (eight) hours as needed for nausea or vomiting. 60 tablet 4  . oxyCODONE (OXY IR/ROXICODONE) 5 MG immediate release tablet Take 1-2 tablets (5-10 mg total) by mouth every 4 (four) hours as needed for moderate pain. 60 tablet 0  . pantoprazole (PROTONIX) 40 MG tablet Take 1 tablet (40 mg total) by mouth every 12 (twelve) hours. 60 tablet 3  . promethazine (PHENERGAN) 25 MG tablet TAKE 1 TABLET BY MOUTH EVERY SIX HOURS AS NEEDED FOR NAUSEA OR VOMITING 30 tablet 6  . rivaroxaban (XARELTO) 20 MG TABS tablet TAKE 1 TABLET BY MOUTH EVERY DAY WITH SUPPER 30 tablet 3  . spironolactone (ALDACTONE) 25 MG tablet Take 1 tablet (25 mg total) by mouth daily. 30 tablet 3  . XELODA 500 MG tablet Take 1500 mg PO BID, 7 days on and 7 days off 84 tablet 1  . Zoledronic Acid (ZOMETA) 4 MG/100ML IVPB Inject 100 mLs (4 mg  total) into the vein every 30 (thirty) days. 100 mL 11  . zolpidem (AMBIEN) 10 MG tablet TAKE ONE TABLET BY MOUTH AT BEDTIME AS NEEDED 30 tablet 1  . potassium chloride SA (K-DUR,KLOR-CON) 20 MEQ tablet Take 1 tablet (20 mEq total) by mouth 3 (three) times  daily. (Patient not taking: Reported on 04/28/2017) 90 tablet 3   No current facility-administered medications for this visit.      Musculoskeletal: Strength & Muscle Tone: within normal limits Gait & Station: normal Patient leans: N/A  Psychiatric Specialty Exam: ROS  Blood pressure 124/78, pulse 74, height '5\' 8"'  (1.727 m), weight 261 lb (118.4 kg), SpO2 96 %.Body mass index is 39.68 kg/m.  General Appearance: Casual and Well Groomed  Eye Contact:  Good  Speech:  Clear and Coherent  Volume:  Normal  Mood:  Euthymic  Affect:  Appropriate, Congruent and cheerful  Thought Process:  Coherent, Goal Directed and Descriptions of Associations: Intact  Orientation:  Full (Time, Place, and Person)  Thought Content: Logical   Suicidal Thoughts:  No  Homicidal Thoughts:  No  Memory:  Immediate;   Fair  Judgement:  Good  Insight:  Good  Psychomotor Activity:  Normal  Concentration:  Concentration: Good  Recall:  Good  Fund of Knowledge: Good  Language: Good  Akathisia:  Negative  Handed:  Right  AIMS (if indicated): not done  Assets:  Communication Skills Desire for Improvement Financial Resources/Insurance Housing Resilience Social Support Transportation  ADL's:  Intact  Cognition: WNL  Sleep:  Good   Screenings:   Assessment and Plan:  Rocklyn H Begay presents today for follow-up visit after recently starting CPAP.  She has had a dramatic improvement in her energy and mood and consistently uses her CPAP.  This intervention has proven to be critical and beneficial for the patient, and I spent time with her celebrating her improved energy and affect.  No acute safety issues, and we will continue medication management as below.  We will follow-up in 6 months or sooner if needed, she is welcome to reach out if she has any concerns in the meantime.  We agreed that there is no need to proceed ECT at this time.  1. Breast cancer, stage 4, unspecified laterality ([place])   2.  Metastatic breast cancer ([place])   3. Depression with anxiety   4. Chronic fatigue     Status of current problems: rapidly improving  Labs Ordered: No orders of the defined types were placed in this encounter.   Labs Reviewed: n/a  Collateral Obtained/Records Reviewed: Reviewed polysomnography results, CPAP titration  Plan:  Continue CPAP per pressure recommendations as calculated at split-night polysomnography Continue Pristiq 100 mg daily Continue Ritalin Continue Ativan as needed for panic or anxiety, or nausea  I spent 20 minutes with the patient in direct face-to-face clinical care.  Greater than 50% of this time was spent in counseling and coordination of care with the patient.    Aundra Dubin, MD 04/28/2017, 10:58 AM

## 2017-04-28 NOTE — Progress Notes (Signed)
[name] tolerated Zometa infusion well without complaints or incident. Labs reviewed prior to administering this medication. VSS upon discharge. Pt discharged self ambulatory in satisfactory condition

## 2017-04-28 NOTE — Patient Instructions (Signed)
[place] at [facility] Discharge Instructions  RECOMMENDATIONS MADE BY THE CONSULTANT AND ANY TEST RESULTS WILL BE SENT TO YOUR REFERRING PHYSICIAN.  zometa given Follow up as scheduled.  Thank you for choosing [place] at [facility] to provide your oncology and hematology care.  To afford each patient quality time with our provider, please arrive at least 15 minutes before your scheduled appointment time.    If you have a lab appointment with the [facility] please come in thru the  Main Entrance and check in at the main information desk  You need to re-schedule your appointment should you arrive 10 or more minutes late.  We strive to give you quality time with our providers, and arriving late affects you and other patients whose appointments are after yours.  Also, if you no show three or more times for appointments you may be dismissed from the clinic at the providers discretion.     Again, thank you for choosing [facility]  Our hope is that these requests will decrease the amount of time that you wait before being seen by our physicians.       _____________________________________________________________  Should you have questions after your visit to [facility], please contact our office at [phone] between the hours of 8:30 a.m. and 4:30 p.m.  Voicemails left after 4:30 p.m. will not be returned until the following business day.  For prescription refill requests, have your pharmacy contact our office.       Resources For Cancer Patients and their Caregivers ? American Cancer Society: Can assist with transportation, wigs, general needs, runs Look Good Feel Better.        305-307-3255 ? Cancer Care: Provides financial assistance, online support groups, medication/co-pay assistance.  1-800-813-HOPE 757-267-7642) ? [facility] Assists [place] cancer patients and their  families through emotional , educational and financial support.  (218)643-5735 ? [place] DSS Where to apply for food stamps, Medicaid and utility assistance. 937-215-7383 ? [facility]: Transportation to medical appointments. (854)217-4675 ? Social Security Administration: May apply for disability if have a Stage IV cancer. 6268094443 201-364-7997 ? [employer], Disability and Transit Services: Assists with nutrition, care and transit needs. [place] Support Programs: @10RELATIVEDAYS @ > Cancer Support Group  2nd Tuesday of the month 1pm-2pm, Journey Room  > Creative Journey  3rd Tuesday of the month 1130am-1pm, Journey Room  > Look Good Feel Better  1st Wednesday of the month 10am-12 noon, Journey Room (Call [place] to register [phone])

## 2017-04-29 LAB — CANCER ANTIGEN 15-3: CAN 15 3: 73.4 U/mL — AB (ref 0.0–25.0)

## 2017-04-29 LAB — CANCER ANTIGEN 27.29: CAN 27.29: 88.7 U/mL — AB (ref 0.0–38.6)

## 2017-05-18 ENCOUNTER — Other Ambulatory Visit (HOSPITAL_COMMUNITY): Payer: Self-pay | Admitting: Adult Health

## 2017-05-18 DIAGNOSIS — C50919 Malignant neoplasm of unspecified site of unspecified female breast: Secondary | ICD-10-CM

## 2017-05-18 MED ORDER — LAPATINIB DITOSYLATE 250 MG PO TABS: 1250.0000 mg | ORAL_TABLET | Freq: Every day | ORAL | 1 refills | Status: DC

## 2017-05-18 MED ORDER — XELODA 500 MG PO TABS: ORAL_TABLET | ORAL | 1 refills | Status: DC

## 2017-05-24 DIAGNOSIS — Z8042 Family history of malignant neoplasm of prostate: Secondary | ICD-10-CM | POA: Insufficient documentation

## 2017-05-24 DIAGNOSIS — K219 Gastro-esophageal reflux disease without esophagitis: Secondary | ICD-10-CM | POA: Insufficient documentation

## 2017-05-24 DIAGNOSIS — C7951 Secondary malignant neoplasm of bone: Secondary | ICD-10-CM | POA: Insufficient documentation

## 2017-05-24 DIAGNOSIS — Z809 Family history of malignant neoplasm, unspecified: Secondary | ICD-10-CM | POA: Insufficient documentation

## 2017-05-24 DIAGNOSIS — C7931 Secondary malignant neoplasm of brain: Secondary | ICD-10-CM | POA: Insufficient documentation

## 2017-05-24 DIAGNOSIS — F329 Major depressive disorder, single episode, unspecified: Secondary | ICD-10-CM | POA: Insufficient documentation

## 2017-05-24 DIAGNOSIS — Z923 Personal history of irradiation: Secondary | ICD-10-CM | POA: Insufficient documentation

## 2017-05-24 DIAGNOSIS — Z90722 Acquired absence of ovaries, bilateral: Secondary | ICD-10-CM | POA: Insufficient documentation

## 2017-05-24 DIAGNOSIS — Z87891 Personal history of nicotine dependence: Secondary | ICD-10-CM | POA: Insufficient documentation

## 2017-05-24 DIAGNOSIS — F419 Anxiety disorder, unspecified: Secondary | ICD-10-CM | POA: Insufficient documentation

## 2017-05-24 DIAGNOSIS — C50911 Malignant neoplasm of unspecified site of right female breast: Secondary | ICD-10-CM | POA: Insufficient documentation

## 2017-05-24 DIAGNOSIS — Z7901 Long term (current) use of anticoagulants: Secondary | ICD-10-CM | POA: Insufficient documentation

## 2017-05-24 DIAGNOSIS — Z806 Family history of leukemia: Secondary | ICD-10-CM | POA: Insufficient documentation

## 2017-05-24 DIAGNOSIS — C787 Secondary malignant neoplasm of liver and intrahepatic bile duct: Secondary | ICD-10-CM | POA: Insufficient documentation

## 2017-05-24 DIAGNOSIS — I429 Cardiomyopathy, unspecified: Secondary | ICD-10-CM | POA: Insufficient documentation

## 2017-05-24 DIAGNOSIS — R1031 Right lower quadrant pain: Secondary | ICD-10-CM | POA: Insufficient documentation

## 2017-05-24 DIAGNOSIS — Z800 Family history of malignant neoplasm of digestive organs: Secondary | ICD-10-CM | POA: Insufficient documentation

## 2017-05-24 DIAGNOSIS — Z86010 Personal history of colonic polyps: Secondary | ICD-10-CM | POA: Insufficient documentation

## 2017-05-24 DIAGNOSIS — Z86718 Personal history of other venous thrombosis and embolism: Secondary | ICD-10-CM | POA: Insufficient documentation

## 2017-05-24 DIAGNOSIS — R51 Headache: Secondary | ICD-10-CM | POA: Insufficient documentation

## 2017-05-24 DIAGNOSIS — Z8701 Personal history of pneumonia (recurrent): Secondary | ICD-10-CM | POA: Insufficient documentation

## 2017-05-24 DIAGNOSIS — Z79899 Other long term (current) drug therapy: Secondary | ICD-10-CM | POA: Insufficient documentation

## 2017-05-24 DIAGNOSIS — Z9221 Personal history of antineoplastic chemotherapy: Secondary | ICD-10-CM | POA: Insufficient documentation

## 2017-05-26 ENCOUNTER — Inpatient Hospital Stay (HOSPITAL_COMMUNITY): Payer: Medicare Other

## 2017-05-26 ENCOUNTER — Encounter (HOSPITAL_COMMUNITY): Payer: Self-pay | Admitting: Adult Health

## 2017-05-26 ENCOUNTER — Other Ambulatory Visit: Payer: Self-pay

## 2017-05-26 ENCOUNTER — Inpatient Hospital Stay (HOSPITAL_COMMUNITY): Payer: Medicare Other | Attending: Adult Health | Admitting: Adult Health

## 2017-05-26 VITALS — BP 122/62 | HR 103 | Temp 97.6°F | Resp 18 | Wt 256.6 lb

## 2017-05-26 DIAGNOSIS — R419 Unspecified symptoms and signs involving cognitive functions and awareness: Secondary | ICD-10-CM | POA: Diagnosis not present

## 2017-05-26 DIAGNOSIS — R1031 Right lower quadrant pain: Secondary | ICD-10-CM | POA: Diagnosis not present

## 2017-05-26 DIAGNOSIS — R519 Headache, unspecified: Secondary | ICD-10-CM

## 2017-05-26 DIAGNOSIS — C50919 Malignant neoplasm of unspecified site of unspecified female breast: Secondary | ICD-10-CM

## 2017-05-26 DIAGNOSIS — Z923 Personal history of irradiation: Secondary | ICD-10-CM | POA: Diagnosis not present

## 2017-05-26 DIAGNOSIS — Z90722 Acquired absence of ovaries, bilateral: Secondary | ICD-10-CM

## 2017-05-26 DIAGNOSIS — Z8042 Family history of malignant neoplasm of prostate: Secondary | ICD-10-CM

## 2017-05-26 DIAGNOSIS — C7931 Secondary malignant neoplasm of brain: Secondary | ICD-10-CM

## 2017-05-26 DIAGNOSIS — Z8701 Personal history of pneumonia (recurrent): Secondary | ICD-10-CM | POA: Diagnosis not present

## 2017-05-26 DIAGNOSIS — Z9221 Personal history of antineoplastic chemotherapy: Secondary | ICD-10-CM | POA: Diagnosis not present

## 2017-05-26 DIAGNOSIS — R51 Headache: Secondary | ICD-10-CM | POA: Diagnosis not present

## 2017-05-26 DIAGNOSIS — C50911 Malignant neoplasm of unspecified site of right female breast: Secondary | ICD-10-CM | POA: Diagnosis not present

## 2017-05-26 DIAGNOSIS — I429 Cardiomyopathy, unspecified: Secondary | ICD-10-CM

## 2017-05-26 DIAGNOSIS — Z86718 Personal history of other venous thrombosis and embolism: Secondary | ICD-10-CM

## 2017-05-26 DIAGNOSIS — F329 Major depressive disorder, single episode, unspecified: Secondary | ICD-10-CM | POA: Diagnosis not present

## 2017-05-26 DIAGNOSIS — Z79899 Other long term (current) drug therapy: Secondary | ICD-10-CM | POA: Diagnosis not present

## 2017-05-26 DIAGNOSIS — Z800 Family history of malignant neoplasm of digestive organs: Secondary | ICD-10-CM

## 2017-05-26 DIAGNOSIS — C7951 Secondary malignant neoplasm of bone: Secondary | ICD-10-CM

## 2017-05-26 DIAGNOSIS — Z87891 Personal history of nicotine dependence: Secondary | ICD-10-CM

## 2017-05-26 DIAGNOSIS — F419 Anxiety disorder, unspecified: Secondary | ICD-10-CM | POA: Diagnosis not present

## 2017-05-26 DIAGNOSIS — C787 Secondary malignant neoplasm of liver and intrahepatic bile duct: Secondary | ICD-10-CM

## 2017-05-26 DIAGNOSIS — Z809 Family history of malignant neoplasm, unspecified: Secondary | ICD-10-CM | POA: Diagnosis not present

## 2017-05-26 DIAGNOSIS — K219 Gastro-esophageal reflux disease without esophagitis: Secondary | ICD-10-CM

## 2017-05-26 DIAGNOSIS — Z7901 Long term (current) use of anticoagulants: Secondary | ICD-10-CM | POA: Diagnosis not present

## 2017-05-26 DIAGNOSIS — Z86010 Personal history of colonic polyps: Secondary | ICD-10-CM

## 2017-05-26 DIAGNOSIS — Z806 Family history of leukemia: Secondary | ICD-10-CM | POA: Diagnosis not present

## 2017-05-26 DIAGNOSIS — C229 Malignant neoplasm of liver, not specified as primary or secondary: Secondary | ICD-10-CM

## 2017-05-26 DIAGNOSIS — Z0000 Encounter for general adult medical examination without abnormal findings: Secondary | ICD-10-CM

## 2017-05-26 DIAGNOSIS — R5382 Chronic fatigue, unspecified: Secondary | ICD-10-CM

## 2017-05-26 DIAGNOSIS — E860 Dehydration: Secondary | ICD-10-CM

## 2017-05-26 DIAGNOSIS — R600 Localized edema: Secondary | ICD-10-CM

## 2017-05-26 LAB — CBC WITH DIFFERENTIAL/PLATELET
Basophils Absolute: 0 10*3/uL (ref 0.0–0.1)
Basophils Relative: 0 %
Eosinophils Absolute: 0 10*3/uL (ref 0.0–0.7)
Eosinophils Relative: 0 %
HEMATOCRIT: 39.1 % (ref 36.0–46.0)
HEMOGLOBIN: 12.1 g/dL (ref 12.0–15.0)
LYMPHS ABS: 1.3 10*3/uL (ref 0.7–4.0)
LYMPHS PCT: 22 %
MCH: 27.6 pg (ref 26.0–34.0)
MCHC: 30.9 g/dL (ref 30.0–36.0)
MCV: 89.3 fL (ref 78.0–100.0)
MONO ABS: 0.4 10*3/uL (ref 0.1–1.0)
MONOS PCT: 6 %
NEUTROS ABS: 4.4 10*3/uL (ref 1.7–7.7)
NEUTROS PCT: 72 %
Platelets: 237 10*3/uL (ref 150–400)
RBC: 4.38 MIL/uL (ref 3.87–5.11)
RDW: 18.1 % — AB (ref 11.5–15.5)
WBC: 6.1 10*3/uL (ref 4.0–10.5)

## 2017-05-26 LAB — COMPREHENSIVE METABOLIC PANEL
ALBUMIN: 3.9 g/dL (ref 3.5–5.0)
ALK PHOS: 96 U/L (ref 38–126)
ALT: 17 U/L (ref 14–54)
ANION GAP: 11 (ref 5–15)
AST: 26 U/L (ref 15–41)
BUN: 12 mg/dL (ref 6–20)
CHLORIDE: 97 mmol/L — AB (ref 101–111)
CO2: 28 mmol/L (ref 22–32)
Calcium: 9.6 mg/dL (ref 8.9–10.3)
Creatinine, Ser: 0.97 mg/dL (ref 0.44–1.00)
GFR calc non Af Amer: >60 mL/min (ref >60)
GLUCOSE: 144 mg/dL — AB (ref 65–99)
Potassium: 3.7 mmol/L (ref 3.5–5.1)
SODIUM: 136 mmol/L (ref 135–145)
Total Bilirubin: 0.7 mg/dL (ref 0.3–1.2)
Total Protein: 7.8 g/dL (ref 6.5–8.1)

## 2017-05-26 MED ORDER — SODIUM CHLORIDE 0.9 % IV SOLN
INTRAVENOUS | Status: DC
  Administered 2017-05-26: 14:00:00 via INTRAVENOUS

## 2017-05-26 MED ORDER — SPIRONOLACTONE 25 MG PO TABS: 25.0000 mg | ORAL_TABLET | Freq: Every day | ORAL | 3 refills | Status: DC

## 2017-05-26 MED ORDER — MORPHINE SULFATE ER 30 MG PO TBCR
30.0000 mg | EXTENDED_RELEASE_TABLET | Freq: Two times a day (BID) | ORAL | 0 refills | Status: DC
Start: 2017-05-26 — End: 2017-06-23

## 2017-05-26 MED ORDER — ZOLEDRONIC ACID 4 MG/100ML IV SOLN
4.0000 mg | Freq: Once | INTRAVENOUS | Status: AC
  Administered 2017-05-26: 4 mg via INTRAVENOUS
  Filled 2017-05-26: qty 100

## 2017-05-26 NOTE — Progress Notes (Signed)
[name], [place] [address]   CLINIC:  Medical Oncology/Hematology  PCP:  [name], MD [place] [address] [phone]   REASON FOR VISIT:  Follow-up for Stage IV metastatic breast adenocarcinoma with liver, bone, brain, and spine mets; ER+/HER2+   CURRENT THERAPY: Xeloda 1500 mg AM and 1500 mg PM, 7 days on/7 days off schedule with Tykerb daily & Zometa monthly   BRIEF ONCOLOGIC HISTORY:    Breast cancer, stage 4 ([place])   04/25/2014 Initial Diagnosis    Breast cancer, stage 4      04/25/2014 Imaging    CT abdomen/pelvis with widespread metastatic disease to the liver, multiple lytic lesions throughout spine and pelvis. No FX or epidural tumor identified      04/26/2014 Imaging    CT head unremarkable      04/26/2014 Imaging    CT chest with no lung mass or pulmonary nodules, no adenopathy. Lytic bone lesions, right 2nd rib      04/27/2014 Initial Biopsy    U/S guided liver biopsy, lesion in anterior and inferior left hepatic lobe biopsied      04/27/2014 Pathology Results    Metastatic adenocarcinoma, CK7, ER+, patchy positivity with PR. Possible primary includes breast, less likely gynecologic      05/15/2014 Mammogram    BI-RADS CATEGORY  2: Benign Finding(s)      05/16/2014 PET scan    1. Intensely hypermetabolic hepatic metastasis. 2. Widespread hypermetabolic skeletal lesions. 3. No primary adenocarcinoma identified by FDG PET imaging.      05/19/2014 Imaging    MUGA- Left ventricular ejection fracture greater than 70%.      05/21/2014 Breast MRI    No suspicious masses or enhancement within the breasts. No axillary adenopathy.      05/22/2014 - 07/03/2014 Antibody Plan    Herceptin/Perjeta/Tamoxifen      06/12/2014 - 07/03/2014 Chemotherapy    Taxotere added secondary to persistent abdominal and back pain      06/17/2014 - 06/19/2014 Hospital Admission    Neutropenia, fever, diarrhea, nausea,  vomiting      06/20/2014 - 07/10/2014 Radiation Therapy    Dr. Thea Silversmith 12 fractions to L3-S3 (30 Gy) and left scapula (20 Gy).       07/03/2014 Adverse Reaction    Perjeta- induced diarrhea.  Perjeta discontinued      07/16/2014 - 07/20/2014 Hospital Admission    Electrolyte abnormalities, and diarrhea.  Suspect Perjeta-induced diarrhea.  Negative GI work-up.      07/24/2014 - 08/19/2015 Chemotherapy    Herceptin/Tamoxifen/Xgeva      08/21/2014 Imaging    MUGA- Left ventricular ejection fraction equals 71%.      08/24/2014 PET scan    Dramatic reduction in metabolic activity of the widespread liver metastasis. Liver metastasis now have metabolic activity equal to background normal liver activity. Liver has a nodular contour. Marked reduction in metabolic activity of skeletal lesions..      10/05/2014 Progression    Widespread metastatic disease to the brain as described. Between 20 and 30 intracranial metastatic deposits are now seen. No midline shift or incipient herniation      10/09/2014 - 10/26/2014 Radiation Therapy    Whole Brain XRT      11/14/2014 Imaging    MUGA- LVEF 67%      02/13/2015 Imaging    MUGA- LVEF 59%      02/15/2015 Treatment Plan Change    Due  to declining LVEF, will hold Herceptin per PI guidelines.      04/12/2015 -  Chemotherapy    Herceptin restarted      06/02/2015 - 06/08/2015 Hospital Admission    Pneumonia      07/05/2015 Progression     PET/CT concern for mild progression of skeletal metastasis with several lesions within the spine and 1 lesion in the Left iliac wing with mild to moderate metabolic activity new from prior. Rising CA 27-29      07/16/2015 - 10/23/2015 Anti-estrogen oral therapy    Arimidex      07/16/2015 Imaging    MRI brain with satisfactory post treatment apperance of brain. interval resolved enhancing R caudate metastasis, minimal punctate residual enhancing metastatic disease at the inferior L cerebellum. No new  metastatic disease or new intracranial abnormality      07/19/2015 Treatment Plan Change    Discontinue Tamoxifen, Zoladex plus Arimidex.       08/27/2015 Procedure    Laparoscopic bilateral salpingo-oophorectomy by Dr. Gaetano Net      10/17/2015 PET scan    Osseous metastatic disease appears slightly progressive based on a new right scapular lesion and increased uptake within lesions in the thoracic spine, left iliac wing and  proximal right femur.      10/17/2015 Progression    Slight progression on PET scan imaging.      10/18/2015 Imaging    REsolved enhancing metastatic disease to the brain status post WBXRT      10/23/2015 - 02/12/2016 Adjuvant Chemotherapy    Faslodex loading followed by maintenance dose.  (Herceptin continued)      11/04/2015 Imaging    MUGA- LEFT ventricular ejection fraction 51% slightly decreased in a 57% on the previous exam.      12/24/2015 Treatment Plan Change    Zometa every 28 days.  Xgeva discontinued.        01/01/2016 Imaging    MUGA- Left ventricular ejection fraction equals 57.9%. This is increased from 51.1% previously.      02/03/2016 PET scan    1. Mixed metabolic changes in the scattered hypermetabolic sclerotic osseous metastases throughout the axial and proximal appendicular skeleton as detailed above. 2. No new sites of hypermetabolic metastatic disease. Stable pseudo-cirrhotic appearance of the liver due to treated liver metastases with no hypermetabolic liver metastases.       02/03/2016 Progression    PET shows mixed osseous response.  Some lesions more hypermetabolic, others improved.      02/05/2016 Treatment Plan Change    D/C Herceptin.  Faslodex as scheduled on 02/12/2016, then discontinued.  Continue Zometa.      02/05/2016 Treatment Plan Change    Prescriptions for Xeloda 7 days on and 7 days off and Tykerb printed and provided for authorization.      02/19/2016 -  Chemotherapy    Xeloda 2300 mg BID 7 days on and 7 days  off and Tykerb       02/24/2016 Treatment Plan Change    Xeloda dose reduced by 10% to 2000 mg BID week on and week off.      03/18/2016 Treatment Plan Change    Xeloda dose reduced to 2000 mg in AM and 1500 mg in PM 7 days on and 7 days off.      03/23/2016 Imaging    MUGA- Normal LEFT ventricular ejection fraction of 56% not significantly changed from 58% on previous exam.      05/19/2016 Imaging    MRI brain- .  No new focus of abnormal enhancement to suggest interval metastatic disease. 2. No acute intracranial abnormality. 3. Stable foci of T2 FLAIR hyperintensity in white matter and in the right caudate head compatible with posttreatment changes and treated metastasis.      05/20/2016 PET scan    1. The multiple osseous metastatic lesions shown to be hypermetabolic on the prior exam are reduced in activity or even resolved in hypermetabolic activity compared to prior, as detailed above. There are also numerous sclerotic bony lesions wedge are not currently and were not previously hypermetabolic, compatible with old non active lesions. 2. No findings of extra osseous metastatic disease currently. 3. Hepatic pseudocirrhosis. 4. Healing rib fractures. Chronic pathologic fracture the left acromion.      07/08/2016 Imaging    MUGA- Low normal LEFT ventricular ejection fraction of 53%, little changed since the 56% on 03/23/2016 but slightly decreased from the 58% on 01/01/2016.      09/07/2016 Echocardiogram    MUGA- Normal LEFT ventricular ejection fraction of 59% with normal LV wall motion.      09/07/2016 Treatment Plan Change    Xeloda dose reduced to 1500 mg BID 7 days on and 7 days off.      09/15/2016 PET scan    No significant change in diffuse sclerotic bone metastases on CT images, although several show mildly increased FDG uptake since prior study.  No evidence of soft tissue metastatic disease.      11/02/2016 Imaging    MRI brain- No change from the  prior study. Previously noted metastatic deposits have been treated. No new lesions identified.        INTERVAL HISTORY:  Ms. Roselli 52 y.o. female presents for continued follow-up for metastatic breast cancer.   Here today with her husband.  Her mother also joined [place] today for this visit; coincidentally she has an appointment to see me after her daughter's visit today.  Overall, Tammy tells me that she has not been feeling well in the last couple of days.  Appetite 25%; energy 0%.  States that she has abdominal pain that started yesterday, particularly to her right lower quadrant.  She tells me that she "has a rumbling tummy"; denies diarrhea or vomiting.  She has no appetite as a result.  She did have a milkshake earlier today, which she has been able to keep down.  She does tell me that her granddaughter was recently ill with a stomach bug.  Denies any fever or chills.  She has some occasional dizziness, which is not new for her and has largely remained stable.  "I just do not feel good today."  Remains on Tykerb and Xeloda.  Does report that she did not take her Xeloda today, given that she could not eat much and vomits if she takes Xeloda on an empty stomach.  Otherwise, she has been taking both of her chemotherapy agents as directed.  Continues to use her CPAP as directed for sleep apnea.  She has had worsening headaches for about the past month.  She thinks this is secondary to her CPAP machine "in my body just getting used to it."  Due for next dose of Zometa today.  She is also requesting IV fluids, as she feels dehydrated given that she has not been able to eat or drink much in the last 24 hours.  Requesting refills for Xeloda, MS Contin, and Aldactone.      REVIEW OF SYSTEMS:  Review of Systems - Oncology, per  HPI. Otherwise, 12-point ROS completed and negative.      PAST MEDICAL/SURGICAL HISTORY:  Past Medical History:  Diagnosis Date  . Anticoagulated    xarelto  .  Anxiety   . Breast cancer metastasized to multiple sites [facility])    liver, brain, and bone  . Breast cancer, stage 4 [facility]) oncologist-  dr Larene [place] ([facility])   dx 12/ 2015 -- breast cancer Stage 4,  ER/HER2 +,  w/  liver, brain and  bone mets/  chemotherapy and radiation therapy  . Chronic pain syndrome    secondary to cancer   . Depression   . Drug-induced cardiomyopathy ([place])    per last MUGA (08-08-2015), ef 56.5/ per last echo 05-18-2014 ef 60-65%  . Family history of prostate cancer   . GERD (gastroesophageal reflux disease)   . History of colon polyps    07-13-2013  benign  . History of DVT (deep vein thrombosis)    07-09-2014  upper right extremity-  RIJ and right subclavian--  resolved  . History of gastritis    erosive  . History of pneumonia    HCAP 06-07-2015--  resolved per cxr 07-04-2015  . History of radiation therapy    12 fractions to L3 - S3, 30Gy and left spacula 20Gy (06-20-2014 to 07-10-2014) //  whole brain rxt (10-09-2014 to 10-26-2014)  . History of small bowel obstruction    S/P RESECTION 2008  . Migraine   . PONV (postoperative nausea and vomiting)    pt states scope patch does well   Past Surgical History:  Procedure Laterality Date  . BREAST REDUCTION SURGERY  03/17/2011   Procedure: MAMMARY REDUCTION BILATERAL (BREAST);  Surgeon: Mary A Contogiannis;  Location: [facility];  Service: Plastics;  Laterality: Bilateral;  . CATARACT EXTRACTION W/ INTRAOCULAR LENS  IMPLANT, BILATERAL  2008  . CERVICAL FUSION  2003   C5 -- C6  . COLONOSCOPY N/A 07/13/2013   Procedure: COLONOSCOPY;  Surgeon: Rogene Houston, MD;  [facility];  Service: Endoscopy;  Laterality: N/A;  930  . COLONOSCOPY N/A 11/26/2014   Procedure: COLONOSCOPY;  Surgeon: Rogene Houston, MD;  [facility];  Service: Endoscopy;  Laterality: N/A;  730  . D & C HYSTEROSCOPY/  RESECTION ENDOMETRIAL MASS/  [place] ENDOMETRIAL ABLATION  04-11-2010  .  DX LAPAROSCOPY W/ PARTIAL SMALL BOWEL RESECTION AND APPENDECTOMY  04-13-2007  . ESOPHAGOGASTRODUODENOSCOPY N/A 05/25/2014   Procedure: ESOPHAGOGASTRODUODENOSCOPY (EGD);  Surgeon: Rogene Houston, MD;  [facility];  Service: Endoscopy;  Laterality: N/A;  155  . KNEE ARTHROSCOPY Right 2005  . LAPAROSCOPIC ASSISTED VAGINAL HYSTERECTOMY  10-13-2010   w/ Bx Left Fallopian tube and Aspiration Right Ovarian Cyst  . LAPAROSCOPIC CHOLECYSTECTOMY  11-17-2002  . LAPAROSCOPIC SALPINGO OOPHERECTOMY Bilateral 08/26/2015   Procedure: LAPAROSCOPIC SALPINGO OOPHORECTOMY, bilateral;  Surgeon: Everlene Farrier, MD;  Location: [facility];  Service: Gynecology;  Laterality: Bilateral;  . PORTACATH PLACEMENT  05-17-2014  . REDUCTION MAMMAPLASTY    . SHOULDER ARTHROSCOPY WITH ROTATOR CUFF REPAIR Right 2002  . TRANSTHORACIC ECHOCARDIOGRAM  05-18-2014   ef 60-65%//   last MUGA  (08-08-2015)  ef 56.6%       SOCIAL HISTORY:  Social History   Socioeconomic History  . Marital status: Married    Spouse name: Not on file  . Number of children: Not on file  . Years of education: Not on file  . Highest education level: Not on file  Social Needs  . Financial resource strain: Not on file  . Food insecurity - worry: Not on file  . Food insecurity - inability: Not on file  . Transportation needs - medical: Not on file  . Transportation needs - non-medical: Not on file  Occupational History  . Not on file  Tobacco Use  . Smoking status: Former Smoker    Types: Cigarettes    Last attempt to quit: 08/20/1994    Years since quitting: 22.7  . Smokeless tobacco: Never Used  Substance and Sexual Activity  . Alcohol use: Yes    Comment: Occasionally  . Drug use: No  . Sexual activity: Yes    Partners: Male    Birth control/protection: Surgical, Condom  Other Topics Concern  . Not on file  Social History Narrative  . Not on file    FAMILY HISTORY:  Family History  Problem Relation Age  of Onset  . Diabetes Father   . Heart attack Maternal Grandmother 30       multiple over lifetime.  . Cancer Maternal Grandmother 66       NOS  . Prostate cancer Maternal Grandfather        dx in his 2s  . Lung cancer Paternal Grandfather        dx <50  . Lymphoma Maternal Aunt        dx in her 68s  . Melanoma Cousin 63       maternal first cousin  . Brain cancer Cousin        paternal first cousin dx under 77  . Prostate cancer Other        MGF's father  . Colon cancer Other        MGM's mother    CURRENT MEDICATIONS:  Outpatient Encounter Medications as of 05/26/2017  Medication Sig  . acetaminophen (TYLENOL) 325 MG tablet Take 650 mg by mouth every 6 (six) hours as needed for moderate pain or headache.  . calcium carbonate (TUMS - DOSED IN MG ELEMENTAL CALCIUM) 500 MG chewable tablet Chew 1 tablet by mouth daily.  . Calcium-Phosphorus-Vitamin D (CALCIUM/D3 ADULT GUMMIES PO) Take 2 tablets by mouth 2 (two) times daily. dosage of calcium '1200mg'$  and vitamin d '1000mg'$   . desvenlafaxine (PRISTIQ) 100 MG 24 hr tablet Take 1 tablet (100 mg total) by mouth daily.  . Difluprednate (DUREZOL) 0.05 % EMUL Apply to eye 2 (two) times daily.  . diphenhydrAMINE (BENADRYL) 25 mg capsule Take 25 mg by mouth every 6 (six) hours as needed for itching.  . furosemide (LASIX) 20 MG tablet Take 20 mg by mouth 2 (two) times daily.  Marland Kitchen gabapentin (NEURONTIN) 300 MG capsule Take 2 capsules (600 mg total) by mouth at bedtime.  Marland Kitchen ketorolac (ACULAR) 0.4 % SOLN INSTILL ONE DROP IN THE RIGHT EYE FOUR TIMES DAILY  . lapatinib (TYKERB) 250 MG tablet Take 5 tablets (1,250 mg total) by mouth daily.  Marland Kitchen lidocaine-prilocaine (EMLA) cream Apply 1 application topically daily as needed (apply to port before chemo).  . LORazepam (ATIVAN) 0.5 MG tablet Take 1 tablet (0.5 mg total) by mouth every 6 (six) hours as needed for anxiety.  . magic mouthwash SOLN Swish and swallow 32m four times a day.  . Melatonin 10 MG CAPS  Take 1 tablet by mouth. Pt states she takes one tablet at night for sleep  . methylphenidate (RITALIN) 20 MG tablet Take 1 tablet (20 mg total) by mouth 2 (two) times daily with breakfast and  lunch.  . morphine (MS CONTIN) 30 MG 12 hr tablet Take 1 tablet (30 mg total) by mouth every 12 (twelve) hours.  Marland Kitchen NARCAN 4 MG/0.1ML LIQD nasal spray kit CALL 911. ADMINISTER A SINGLE SPRAY OF NARCAN IN ONE NOSTRIL, REPEAT EVERY 3 MINUTES AS NEEDED IF NO OR MINIMAL RESPONSE  . ondansetron (ZOFRAN) 8 MG tablet Take 1 tablet (8 mg total) by mouth every 8 (eight) hours as needed for nausea or vomiting.  Marland Kitchen oxyCODONE (OXY IR/ROXICODONE) 5 MG immediate release tablet Take 1-2 tablets (5-10 mg total) by mouth every 4 (four) hours as needed for moderate pain.  . pantoprazole (PROTONIX) 40 MG tablet Take 1 tablet (40 mg total) by mouth every 12 (twelve) hours.  . potassium chloride SA (K-DUR,KLOR-CON) 20 MEQ tablet Take 1 tablet (20 mEq total) by mouth 3 (three) times daily.  . promethazine (PHENERGAN) 25 MG tablet TAKE 1 TABLET BY MOUTH EVERY SIX HOURS AS NEEDED FOR NAUSEA OR VOMITING  . rivaroxaban (XARELTO) 20 MG TABS tablet TAKE 1 TABLET BY MOUTH EVERY DAY WITH SUPPER  . spironolactone (ALDACTONE) 25 MG tablet Take 1 tablet (25 mg total) by mouth daily.  . XELODA 500 MG tablet Take 1500 mg PO BID, 7 days on and 7 days off  . Zoledronic Acid (ZOMETA) 4 MG/100ML IVPB Inject 100 mLs (4 mg total) into the vein every 30 (thirty) days.  Marland Kitchen zolpidem (AMBIEN) 10 MG tablet TAKE ONE TABLET BY MOUTH AT BEDTIME AS NEEDED  . [DISCONTINUED] morphine (MS CONTIN) 30 MG 12 hr tablet Take 1 tablet (30 mg total) by mouth every 12 (twelve) hours.  . [DISCONTINUED] spironolactone (ALDACTONE) 25 MG tablet Take 1 tablet (25 mg total) by mouth daily.   No facility-administered encounter medications on file as of 05/26/2017.     ALLERGIES:  No Known Allergies   PHYSICAL EXAM:  ECOG Performance status: 1 - Symptomatic, but independent.    Vitals:   05/26/17 1342  BP: 122/62  Pulse: (!) 103  Resp: 18  Temp: 97.6 F (36.4 C)  SpO2: 96%   Filed Weights   05/26/17 1342  Weight: 256 lb 9.6 oz (116.4 kg)      Physical Exam  Constitutional: She is oriented to person, place, and time and well-developed, well-nourished, and in no distress.  HENT:  Head: Normocephalic.  Mouth/Throat: Oropharynx is clear and moist. No oropharyngeal exudate.  Eyes: Conjunctivae are normal. Pupils are equal, round, and reactive to light. No scleral icterus.  Neck: Normal range of motion. Neck supple.  Cardiovascular: Regular rhythm.  Mild tachycardia  Pulmonary/Chest: Effort normal and breath sounds normal. No respiratory distress. She has no wheezes.  Abdominal: Soft. Bowel sounds are normal. There is tenderness (subtle tenderness noted in epigastric region. Otherwise, no pain on palpation to RLQ or other parts of abd).  Musculoskeletal: Normal range of motion. She exhibits no edema.  Lymphadenopathy:    She has no cervical adenopathy.       Right: No supraclavicular adenopathy present.       Left: No supraclavicular adenopathy present.  Neurological: She is alert and oriented to person, place, and time. No cranial nerve deficit. Gait normal.  Skin: Skin is warm and dry. No rash noted.  Psychiatric: Mood, memory, affect and judgment normal.  Nursing note and vitals reviewed.    LABORATORY DATA:  I have reviewed the labs as listed.  CBC    Component Value Date/Time   WBC 6.1 05/26/2017 1337   RBC 4.38 05/26/2017  1337   HGB 12.1 05/26/2017 1337   HCT 39.1 05/26/2017 1337   PLT 237 05/26/2017 1337   MCV 89.3 05/26/2017 1337   MCH 27.6 05/26/2017 1337   MCHC 30.9 05/26/2017 1337   RDW 18.1 (H) 05/26/2017 1337   LYMPHSABS 1.3 05/26/2017 1337   MONOABS 0.4 05/26/2017 1337   EOSABS 0.0 05/26/2017 1337   BASOSABS 0.0 05/26/2017 1337   CMP Latest Ref Rng & Units 05/26/2017 04/28/2017 03/31/2017  Glucose 65 - 99 mg/dL 144(H)  128(H) 105(H)  BUN 6 - 20 mg/dL '12 13 7  '$ Creatinine 0.44 - 1.00 mg/dL 0.97 0.86 0.87  Sodium 135 - 145 mmol/L 136 138 135  Potassium 3.5 - 5.1 mmol/L 3.7 3.7 3.6  Chloride 101 - 111 mmol/L 97(L) 100(L) 98(L)  CO2 22 - 32 mmol/L '28 26 29  '$ Calcium 8.9 - 10.3 mg/dL 9.6 9.3 9.4  Total Protein 6.5 - 8.1 g/dL 7.8 7.2 6.9  Total Bilirubin 0.3 - 1.2 mg/dL 0.7 0.3 0.8  Alkaline Phos 38 - 126 U/L 96 76 94  AST 15 - 41 U/L '26 24 30  '$ ALT 14 - 54 U/L '17 17 21         '$ PENDING LABS:     DIAGNOSTIC IMAGING:  Most recent PET scan: 03/24/17 CLINICAL DATA:  Subsequent treatment strategy for metastatic breast cancer.  EXAM: NUCLEAR MEDICINE PET SKULL BASE TO THIGH  TECHNIQUE: 13.0 mCi F-18 FDG was injected intravenously. Full-ring PET imaging was performed from the skull base to thigh after the radiotracer. CT data was obtained and used for attenuation correction and anatomic localization.  FASTING BLOOD GLUCOSE:  Value: 110 mg/dl  COMPARISON:  01/20/2017  FINDINGS: NECK: No hypermetabolic lymph nodes in the neck.  CHEST: No hypermetabolic mediastinal or hilar nodes. No suspicious pulmonary nodules on the CT scan.  ABDOMEN/PELVIS: No abnormal hypermetabolic activity within the liver, pancreas, adrenal glands, or spleen. No hypermetabolic lymph nodes in the abdomen or pelvis.  SKELETON: Multifocal hypermetabolic skeletal metastases are again noted. The index lesion within the right glenoid has an SUV max equal to 15.9. Previously 12.3. The T7 lesion within the T7 vertebra has an SUV max equal to 7.95. Previously 5.7. Hypermetabolic lesion involving the T10 vertebra and extending into the left posterior elements has an SUV max of 11.82. Previously 8.3. Index lesion within the left iliac crest has an SUV max equal to 15.27. Previously 16.86. Hypermetabolic lesion within the right hip has an SUV max of 17.06. Previously 16.23.  IMPRESSION: 1. No evidence for  hypermetabolic soft tissue metastases. 2. No significant change and hypermetabolic bone metastasis with some mild variability in degree of FDG uptake.   Electronically Signed   By: Kerby Moors M.D.   On: 03/24/2017 15:49     Most recent ECHO: 04/27/17  *[place], [place] [address]                            [phone]  ------------------------------------------------------------------- Transthoracic Echocardiography  Patient:    Allison, [name] MR #:       [id number] Study Date: 04/27/2017 Gender:     F Age:  51 Height:     172.7 cm Weight:     117.5 kg BSA:        2.43 m^2 Pt. Status: Room:   SONOGRAPHER  [facility]  PERFORMING   [facility], [place] [facility]  ATTENDING    Laurelyn Sickle, Elzie Rings W  REFERRING    Mike Craze W  cc:  ------------------------------------------------------------------- LV EF: 60% -   65%  ------------------------------------------------------------------- History:   PMH:  Metastatic Breast Cancer, Drug Induced Cardiomyopathy, Bone and Brain Metastases, Former Smoker.  ------------------------------------------------------------------- Study Conclusions  - Left ventricle: The cavity size was normal. Wall thickness was   increased in a pattern of mild LVH. Systolic function was normal.   The estimated ejection fraction was in the range of 60% to 65%.   Normal global longitudinal strain of -22.5%. Wall motion was   normal; there were no regional wall motion abnormalities. Left   ventricular diastolic function parameters were normal. - Aortic valve: Mildly calcified annulus. Trileaflet. - Right atrium: Central venous pressure (est): 3 mm Hg. - Atrial septum: No defect or patent foramen ovale was identified. - Tricuspid valve: There was trivial regurgitation. - Pulmonary arteries: Systolic pressure could  not be accurately   estimated. - Pericardium, extracardiac: There was no pericardial effusion.  Impressions:  - Mild LVH with LVEF 60-65% and normal diastolic function. Normal   global longitudinal strain of -22.5%. Mildly calcified aortic   annulus. Trivial tricuspid regurgitation.     MRI brain: 11/02/16 CLINICAL DATA:  Metastatic breast cancer. Whole-brain radiation and chemotherapy  EXAM: MRI HEAD WITHOUT AND WITH CONTRAST  TECHNIQUE: Multiplanar, multiecho pulse sequences of the brain and surrounding structures were obtained without and with intravenous contrast.  CONTRAST:  71m MULTIHANCE GADOBENATE DIMEGLUMINE 529 MG/ML IV SOLN  COMPARISON:  MRI 05/19/2016  FINDINGS: Brain: Negative for enhancing metastatic deposits in the brain. Stable 3 mm T2 hyperintensity in the head of the caudate on the right is unchanged compatible with treated tumor. Other metastatic deposit is no longer enhance. No new lesions identified.  Ventricle size normal. Negative for acute infarct. Mild chronic white matter changes stable. Negative for hemorrhage or mass.  Vascular: Normal arterial flow void  Skull and upper cervical spine: Negative  Sinuses/Orbits: Negative  Other: None  IMPRESSION: No change from the prior study. Previously noted metastatic deposits have been treated. No new lesions identified.   Electronically Signed   By: CFranchot GalloM.D.   On: 11/02/2016 17:02     PATHOLOGY:  [facility]: 05/25/14            ASSESSMENT & PLAN:   Stage IV metastatic breast adenocarcinoma with liver, bone, brain, and spine mets; ER+/HER2+: -Currently taking Xeloda 1500 mg BID 7 days on and 7 days off with Tykerb daily.  -Tumor markers (CA 27-29 & CA 15-3) have been slowly increasing over time.  Most recent PET scan results from 03/24/17 remains stable without evidence of any visceral/soft tissue mets; bony lesions are largely unchanged/stable  with varying degrees of hypermetabolism on PET.   Will plan to restage with PET scan in 07/2017; will place orders at subsequent follow-up visits.  -ECHO on 04/27/17 showed EF 60-65%.  She will be due for next cardiac evaluation given potential cardiotoxicity of Tykerb therapy in 07/2017.  Will place order for echo at next follow-up visit. -Continue current treatment with Xeloda and Tykerb as prescribed.  Refill for Xeloda faxed to specialty pharmacy several days ago; instructed  patient to call us if she does not receive her medication soon. -Return to cancer center in 1 month for follow-up with next Zometa infusion. She prefers to be seen monthly.   New abdominal pain: -Right lower quadrant abdominal pain started yesterday, without associated diarrhea or vomiting.  She does have nausea, which is not uncommon during her week of taking the Xeloda.  On physical exam, her abdomen is soft and only mildly tender to palpation in the epigastric region.  No tenderness to palpation appreciated in the right lower quadrant on exam. -Since she has been nauseated, and tells me that she feels dehydrated, I will plan to give her IVF with 1L NS over 2 hours today, in addition to her scheduled Zometa.  -Her symptoms are slightly improved today.  She was offered CT imaging, but prefers to wait to see if her symptoms continue to improve.  I encouraged her to eat small bland meals over the next couple of days.  I asked that she give [place] a call on Friday (2 days from now) with an update on how she is feeling.  If she continues to have persistent or worsening abdominal pain at that time, then we will proceed with CT abdomen/pelvis imaging at that time.  She agrees with this plan. -Differential diagnoses include diverticulitis, chemo effect, viral illness, or malignancy (which seems less likely).  Headaches: -Progressive headaches noted in the past ~1 month.  She thinks this may be secondary to her CPAP machine.  However, she  does have history of brain metastases and is due for routine MRI imaging.  Therefore, MRI of brain orders placed today and will try to get exam completed sometime within the next week or so.  Last MRI of brain in 10/2016 was negative for recurrent disease.  Fatigue/Sleep disturbance:  -Sleep study completed on 02/23/17 (ordered by psychiatry), showed severe sleep apnea.  She has been using CPAP machine for the past couple of weeks and has seen improvement in her symptoms.  -Continue Ambien at bedtime as needed.   Hypokalemia/Chronic LE edema:  -Hypokalemia resolved. Continue Aldactone for diuresis. E-scribed a refill of Aldactone to her pharmacy today.  Bone mets:  -Continue monthly Zometa. Due for next dose today; serum calcium adequate for infusion today as scheduled.   Cancer-related fatigue & Depression/Anxiety:  -Continue current medication regimen per psychiatry with Pristiq daily and Ativan PRN.  -Continue follow-up with psychiatry as directed. -Continue Ritalin 20 mg BID, although she historically only takes once per day.  She generally does not take the medication BID, but it is helpful with first dose in the mornings.    In the future, I would be reluctant to further increase the dose given tachycardia and possibility of cardiotoxicity with Tykerb. -Dr. Daron Offer with psychiatry recently refilled Ritalin & Pristiq for patient. I am happy to have Dr. Joycelyn Schmid team assume responsibility of managing her fatigue and depression medications.   Chronic pain secondary to neoplasm:  -MS Contin effective in managing her pain.  Maintain current dose of MS Contin for basal pain control with oxycodone PRN. Bowels reportedly moving well.  Requesting refill of MS Contin; [place] Controlled Substance Reporting System reviewed and refill appropriate within the next few days; paper prescription given to patient today.          Dispo:  -IVF (1L NS over 2 hours) today with Zometa.  -MRI brain sometime  within the next week. -Return to cancer center in 1 month for follow-up with next Zometa  infusion.     All questions were answered to patient's stated satisfaction. Encouraged patient to call with any new concerns or questions before her next visit to the cancer center and we can certain see her sooner, if needed.    Plan of care discussed with Dr. Sherrine Maples, who agrees with the above aforementioned.    Orders placed this encounter:  MRI Brain w/wo contrast No orders of the defined types were placed in this encounter.      Mike Craze, NP [place] [phone]

## 2017-05-26 NOTE — Patient Instructions (Addendum)
[place] at [facility] Discharge Instructions  RECOMMENDATIONS MADE BY THE CONSULTANT AND ANY TEST RESULTS WILL BE SENT TO YOUR REFERRING PHYSICIAN.  You were seen today by Mike Craze, NP We are going to give you some extra fluid today with treatment Call us early on Friday and let us know how you are feeling.  If we need to we can get you in for more fluids. We will get you set up for MRI brain Follow up in 1 month with next treatment See schedulers up front for appointments   Thank you for choosing [place] at [facility] to provide your oncology and hematology care.  To afford each patient quality time with our provider, please arrive at least 15 minutes before your scheduled appointment time.    If you have a lab appointment with the [facility] please come in thru the  Main Entrance and check in at the main information desk  You need to re-schedule your appointment should you arrive 10 or more minutes late.  We strive to give you quality time with our providers, and arriving late affects you and other patients whose appointments are after yours.  Also, if you no show three or more times for appointments you may be dismissed from the clinic at the providers discretion.     Again, thank you for choosing [facility].  Our hope is that these requests will decrease the amount of time that you wait before being seen by our physicians.       _____________________________________________________________  Should you have questions after your visit to [facility], please contact our office at [phone] between the hours of 8:30 a.m. and 4:30 p.m.  Voicemails left after 4:30 p.m. will not be returned until the following business day.  For prescription refill requests, have your pharmacy contact our office.       Resources For Cancer Patients and their Caregivers ? American Cancer Society: Can assist with  transportation, wigs, general needs, runs Look Good Feel Better.        678-378-4236 ? Cancer Care: Provides financial assistance, online support groups, medication/co-pay assistance.  1-800-813-HOPE 559 010 3166) ? [place] Assists [place] cancer patients and their families through emotional , educational and financial support.  249-119-7588 ? [place] DSS Where to apply for food stamps, Medicaid and utility assistance. (380) 249-9489 ? [facility]: Transportation to medical appointments. 4353034132 ? Social Security Administration: May apply for disability if have a Stage IV cancer. 617-367-2169 646-369-6768 ? [employer], Disability and Transit Services: Assists with nutrition, care and transit needs. [place] Support Programs: @10RELATIVEDAYS @ > Cancer Support Group  2nd Tuesday of the month 1pm-2pm, Journey Room  > Creative Journey  3rd Tuesday of the month 1130am-1pm, Journey Room  > Look Good Feel Better  1st Wednesday of the month 10am-12 noon, Journey Room (Call [place] to register [phone])

## 2017-05-27 LAB — CANCER ANTIGEN 15-3: CAN 15 3: 91.5 U/mL — AB (ref 0.0–25.0)

## 2017-05-27 LAB — CANCER ANTIGEN 27.29: CA 27.29: 94.2 U/mL — ABNORMAL HIGH (ref 0.0–38.6)

## 2017-05-27 NOTE — Progress Notes (Signed)
One liter of fluids given per orders.  Treatment given per orders. Patient tolerated it well without problems. Vitals stable and discharged home from clinic ambulatory. Follow up as scheduled.

## 2017-06-02 ENCOUNTER — Ambulatory Visit (HOSPITAL_COMMUNITY)
Admission: RE | Admit: 2017-06-02 | Discharge: 2017-06-02 | Disposition: A | Discharge status: Home / Self Care | Payer: Medicare Other | Source: Ambulatory Visit | Attending: Adult Health | Admitting: Adult Health

## 2017-06-02 DIAGNOSIS — Z85841 Personal history of malignant neoplasm of brain: Secondary | ICD-10-CM | POA: Diagnosis not present

## 2017-06-02 DIAGNOSIS — Z853 Personal history of malignant neoplasm of breast: Secondary | ICD-10-CM | POA: Insufficient documentation

## 2017-06-02 DIAGNOSIS — C50919 Malignant neoplasm of unspecified site of unspecified female breast: Secondary | ICD-10-CM

## 2017-06-02 DIAGNOSIS — R51 Headache: Secondary | ICD-10-CM | POA: Insufficient documentation

## 2017-06-02 DIAGNOSIS — C7931 Secondary malignant neoplasm of brain: Secondary | ICD-10-CM

## 2017-06-02 DIAGNOSIS — R519 Headache, unspecified: Secondary | ICD-10-CM

## 2017-06-02 MED ORDER — GADOBENATE DIMEGLUMINE 529 MG/ML IV SOLN
20.0000 mL | Freq: Once | INTRAVENOUS | Status: AC | PRN
  Administered 2017-06-02: 20 mL via INTRAVENOUS

## 2017-06-04 ENCOUNTER — Encounter (HOSPITAL_COMMUNITY): Payer: Self-pay | Admitting: Adult Health

## 2017-06-04 ENCOUNTER — Other Ambulatory Visit: Payer: Self-pay

## 2017-06-04 ENCOUNTER — Inpatient Hospital Stay (HOSPITAL_BASED_OUTPATIENT_CLINIC_OR_DEPARTMENT_OTHER): Payer: Medicare Other | Admitting: Adult Health

## 2017-06-04 VITALS — BP 123/83 | HR 89 | Temp 98.1°F | Resp 20 | Wt 262.0 lb

## 2017-06-04 DIAGNOSIS — Z809 Family history of malignant neoplasm, unspecified: Secondary | ICD-10-CM

## 2017-06-04 DIAGNOSIS — Z9221 Personal history of antineoplastic chemotherapy: Secondary | ICD-10-CM

## 2017-06-04 DIAGNOSIS — F329 Major depressive disorder, single episode, unspecified: Secondary | ICD-10-CM | POA: Diagnosis not present

## 2017-06-04 DIAGNOSIS — Z8701 Personal history of pneumonia (recurrent): Secondary | ICD-10-CM

## 2017-06-04 DIAGNOSIS — C7931 Secondary malignant neoplasm of brain: Secondary | ICD-10-CM | POA: Diagnosis not present

## 2017-06-04 DIAGNOSIS — Z7901 Long term (current) use of anticoagulants: Secondary | ICD-10-CM

## 2017-06-04 DIAGNOSIS — Z90722 Acquired absence of ovaries, bilateral: Secondary | ICD-10-CM

## 2017-06-04 DIAGNOSIS — Z86010 Personal history of colonic polyps: Secondary | ICD-10-CM | POA: Diagnosis not present

## 2017-06-04 DIAGNOSIS — R1031 Right lower quadrant pain: Secondary | ICD-10-CM | POA: Diagnosis not present

## 2017-06-04 DIAGNOSIS — Z87891 Personal history of nicotine dependence: Secondary | ICD-10-CM

## 2017-06-04 DIAGNOSIS — C50919 Malignant neoplasm of unspecified site of unspecified female breast: Secondary | ICD-10-CM

## 2017-06-04 DIAGNOSIS — I429 Cardiomyopathy, unspecified: Secondary | ICD-10-CM | POA: Diagnosis not present

## 2017-06-04 DIAGNOSIS — Z806 Family history of leukemia: Secondary | ICD-10-CM

## 2017-06-04 DIAGNOSIS — C787 Secondary malignant neoplasm of liver and intrahepatic bile duct: Secondary | ICD-10-CM | POA: Diagnosis not present

## 2017-06-04 DIAGNOSIS — F419 Anxiety disorder, unspecified: Secondary | ICD-10-CM | POA: Diagnosis not present

## 2017-06-04 DIAGNOSIS — Z923 Personal history of irradiation: Secondary | ICD-10-CM

## 2017-06-04 DIAGNOSIS — R51 Headache: Secondary | ICD-10-CM

## 2017-06-04 DIAGNOSIS — Z8042 Family history of malignant neoplasm of prostate: Secondary | ICD-10-CM

## 2017-06-04 DIAGNOSIS — C7951 Secondary malignant neoplasm of bone: Secondary | ICD-10-CM

## 2017-06-04 DIAGNOSIS — C50911 Malignant neoplasm of unspecified site of right female breast: Secondary | ICD-10-CM

## 2017-06-04 DIAGNOSIS — Z86718 Personal history of other venous thrombosis and embolism: Secondary | ICD-10-CM

## 2017-06-04 DIAGNOSIS — Z79899 Other long term (current) drug therapy: Secondary | ICD-10-CM | POA: Diagnosis not present

## 2017-06-04 DIAGNOSIS — K219 Gastro-esophageal reflux disease without esophagitis: Secondary | ICD-10-CM

## 2017-06-04 DIAGNOSIS — Z800 Family history of malignant neoplasm of digestive organs: Secondary | ICD-10-CM

## 2017-06-04 DIAGNOSIS — Z7189 Other specified counseling: Secondary | ICD-10-CM

## 2017-06-04 NOTE — Progress Notes (Signed)
[name], [place] [address]   CLINIC:  Medical Oncology/Hematology  PCP:  [name], MD [place] [address] [phone]   REASON FOR VISIT:  Follow-up for Stage IV metastatic breast adenocarcinoma with liver, bone, brain, and spine mets; ER+/HER2+   CURRENT THERAPY: Xeloda 1500 mg AM and 1500 mg PM, 7 days on/7 days off schedule with Tykerb daily & Zometa monthly   BRIEF ONCOLOGIC HISTORY:    Breast cancer, stage 4 ([place])   04/25/2014 Initial Diagnosis    Breast cancer, stage 4      04/25/2014 Imaging    CT abdomen/pelvis with widespread metastatic disease to the liver, multiple lytic lesions throughout spine and pelvis. No FX or epidural tumor identified      04/26/2014 Imaging    CT head unremarkable      04/26/2014 Imaging    CT chest with no lung mass or pulmonary nodules, no adenopathy. Lytic bone lesions, right 2nd rib      04/27/2014 Initial Biopsy    U/S guided liver biopsy, lesion in anterior and inferior left hepatic lobe biopsied      04/27/2014 Pathology Results    Metastatic adenocarcinoma, CK7, ER+, patchy positivity with PR. Possible primary includes breast, less likely gynecologic      05/15/2014 Mammogram    BI-RADS CATEGORY  2: Benign Finding(s)      05/16/2014 PET scan    1. Intensely hypermetabolic hepatic metastasis. 2. Widespread hypermetabolic skeletal lesions. 3. No primary adenocarcinoma identified by FDG PET imaging.      05/19/2014 Imaging    MUGA- Left ventricular ejection fracture greater than 70%.      05/21/2014 Breast MRI    No suspicious masses or enhancement within the breasts. No axillary adenopathy.      05/22/2014 - 07/03/2014 Antibody Plan    Herceptin/Perjeta/Tamoxifen      06/12/2014 - 07/03/2014 Chemotherapy    Taxotere added secondary to persistent abdominal and back pain      06/17/2014 - 06/19/2014 Hospital Admission    Neutropenia, fever, diarrhea, nausea,  vomiting      06/20/2014 - 07/10/2014 Radiation Therapy    Dr. Thea Silversmith 12 fractions to L3-S3 (30 Gy) and left scapula (20 Gy).       07/03/2014 Adverse Reaction    Perjeta- induced diarrhea.  Perjeta discontinued      07/16/2014 - 07/20/2014 Hospital Admission    Electrolyte abnormalities, and diarrhea.  Suspect Perjeta-induced diarrhea.  Negative GI work-up.      07/24/2014 - 08/19/2015 Chemotherapy    Herceptin/Tamoxifen/Xgeva      08/21/2014 Imaging    MUGA- Left ventricular ejection fraction equals 71%.      08/24/2014 PET scan    Dramatic reduction in metabolic activity of the widespread liver metastasis. Liver metastasis now have metabolic activity equal to background normal liver activity. Liver has a nodular contour. Marked reduction in metabolic activity of skeletal lesions..      10/05/2014 Progression    Widespread metastatic disease to the brain as described. Between 20 and 30 intracranial metastatic deposits are now seen. No midline shift or incipient herniation      10/09/2014 - 10/26/2014 Radiation Therapy    Whole Brain XRT      11/14/2014 Imaging    MUGA- LVEF 67%      02/13/2015 Imaging    MUGA- LVEF 59%      02/15/2015 Treatment Plan Change    Due  to declining LVEF, will hold Herceptin per PI guidelines.      04/12/2015 -  Chemotherapy    Herceptin restarted      06/02/2015 - 06/08/2015 Hospital Admission    Pneumonia      07/05/2015 Progression     PET/CT concern for mild progression of skeletal metastasis with several lesions within the spine and 1 lesion in the Left iliac wing with mild to moderate metabolic activity new from prior. Rising CA 27-29      07/16/2015 - 10/23/2015 Anti-estrogen oral therapy    Arimidex      07/16/2015 Imaging    MRI brain with satisfactory post treatment apperance of brain. interval resolved enhancing R caudate metastasis, minimal punctate residual enhancing metastatic disease at the inferior L cerebellum. No new  metastatic disease or new intracranial abnormality      07/19/2015 Treatment Plan Change    Discontinue Tamoxifen, Zoladex plus Arimidex.       08/27/2015 Procedure    Laparoscopic bilateral salpingo-oophorectomy by Dr. Gaetano Net      10/17/2015 PET scan    Osseous metastatic disease appears slightly progressive based on a new right scapular lesion and increased uptake within lesions in the thoracic spine, left iliac wing and  proximal right femur.      10/17/2015 Progression    Slight progression on PET scan imaging.      10/18/2015 Imaging    REsolved enhancing metastatic disease to the brain status post WBXRT      10/23/2015 - 02/12/2016 Adjuvant Chemotherapy    Faslodex loading followed by maintenance dose.  (Herceptin continued)      11/04/2015 Imaging    MUGA- LEFT ventricular ejection fraction 51% slightly decreased in a 57% on the previous exam.      12/24/2015 Treatment Plan Change    Zometa every 28 days.  Xgeva discontinued.        01/01/2016 Imaging    MUGA- Left ventricular ejection fraction equals 57.9%. This is increased from 51.1% previously.      02/03/2016 PET scan    1. Mixed metabolic changes in the scattered hypermetabolic sclerotic osseous metastases throughout the axial and proximal appendicular skeleton as detailed above. 2. No new sites of hypermetabolic metastatic disease. Stable pseudo-cirrhotic appearance of the liver due to treated liver metastases with no hypermetabolic liver metastases.       02/03/2016 Progression    PET shows mixed osseous response.  Some lesions more hypermetabolic, others improved.      02/05/2016 Treatment Plan Change    D/C Herceptin.  Faslodex as scheduled on 02/12/2016, then discontinued.  Continue Zometa.      02/05/2016 Treatment Plan Change    Prescriptions for Xeloda 7 days on and 7 days off and Tykerb printed and provided for authorization.      02/19/2016 -  Chemotherapy    Xeloda 2300 mg BID 7 days on and 7 days  off and Tykerb       02/24/2016 Treatment Plan Change    Xeloda dose reduced by 10% to 2000 mg BID week on and week off.      03/18/2016 Treatment Plan Change    Xeloda dose reduced to 2000 mg in AM and 1500 mg in PM 7 days on and 7 days off.      03/23/2016 Imaging    MUGA- Normal LEFT ventricular ejection fraction of 56% not significantly changed from 58% on previous exam.      05/19/2016 Imaging    MRI brain- .  No new focus of abnormal enhancement to suggest interval metastatic disease. 2. No acute intracranial abnormality. 3. Stable foci of T2 FLAIR hyperintensity in white matter and in the right caudate head compatible with posttreatment changes and treated metastasis.      05/20/2016 PET scan    1. The multiple osseous metastatic lesions shown to be hypermetabolic on the prior exam are reduced in activity or even resolved in hypermetabolic activity compared to prior, as detailed above. There are also numerous sclerotic bony lesions wedge are not currently and were not previously hypermetabolic, compatible with old non active lesions. 2. No findings of extra osseous metastatic disease currently. 3. Hepatic pseudocirrhosis. 4. Healing rib fractures. Chronic pathologic fracture the left acromion.      07/08/2016 Imaging    MUGA- Low normal LEFT ventricular ejection fraction of 53%, little changed since the 56% on 03/23/2016 but slightly decreased from the 58% on 01/01/2016.      09/07/2016 Echocardiogram    MUGA- Normal LEFT ventricular ejection fraction of 59% with normal LV wall motion.      09/07/2016 Treatment Plan Change    Xeloda dose reduced to 1500 mg BID 7 days on and 7 days off.      09/15/2016 PET scan    No significant change in diffuse sclerotic bone metastases on CT images, although several show mildly increased FDG uptake since prior study.  No evidence of soft tissue metastatic disease.      11/02/2016 Imaging    MRI brain- No change from the  prior study. Previously noted metastatic deposits have been treated. No new lesions identified.        INTERVAL HISTORY:  [name] 52 y.o. female presents for continued follow-up for metastatic breast cancer.   Here today with her husband.  They called and made this appointment to discuss her MRI brain results and her overall plan of care today.   Ashanta continues to have her general complaints of no appetite "on my chemo week" (Xeloda). Energy levels 50%.  She tells me that she has quite a bit of anxiety about "my numbers (tumor markers) are continuing to go up."  She is not sure if she wants to continue taking this therapy "or do I have other options since this treatment might not be working?"   Remains on [address].   She has headache, which has been intermittent  REVIEW OF SYSTEMS:  Review of Systems - Oncology, per HPI. Otherwise, 12-point ROS completed and negative.      PAST MEDICAL/SURGICAL HISTORY:  Past Medical History:  Diagnosis Date  . Anticoagulated    xarelto  . Anxiety   . Breast cancer metastasized to multiple sites [facility])    liver, brain, and bone  . Breast cancer, stage 4 [facility]) oncologist-  dr Larene [place] ([facility])   dx 12/ 2015 -- breast cancer Stage 4,  ER/HER2 +,  w/  liver, brain and  bone mets/  chemotherapy and radiation therapy  . Chronic pain syndrome    secondary to cancer   . Depression   . Drug-induced cardiomyopathy ([place])    per last MUGA (08-08-2015), ef 56.5/ per last echo 05-18-2014 ef 60-65%  . Family history of prostate cancer   . GERD (gastroesophageal reflux disease)   . History of colon polyps    07-13-2013  benign  . History of DVT (deep vein thrombosis)    07-09-2014  upper right extremity-  RIJ and right subclavian--  resolved  . History  of gastritis    erosive  . History of pneumonia    HCAP 06-07-2015--  resolved per cxr 07-04-2015  . History of radiation therapy    12 fractions to L3 - S3, 30Gy and left  spacula 20Gy (06-20-2014 to 07-10-2014) //  whole brain rxt (10-09-2014 to 10-26-2014)  . History of small bowel obstruction    S/P RESECTION 2008  . Migraine   . PONV (postoperative nausea and vomiting)    pt states scope patch does well   Past Surgical History:  Procedure Laterality Date  . BREAST REDUCTION SURGERY  03/17/2011   Procedure: MAMMARY REDUCTION BILATERAL (BREAST);  Surgeon: Mary A Contogiannis;  Location: [facility];  Service: Plastics;  Laterality: Bilateral;  . CATARACT EXTRACTION W/ INTRAOCULAR LENS  IMPLANT, BILATERAL  2008  . CERVICAL FUSION  2003   C5 -- C6  . COLONOSCOPY N/A 07/13/2013   Procedure: COLONOSCOPY;  Surgeon: Rogene Houston, MD;  [facility];  Service: Endoscopy;  Laterality: N/A;  930  . COLONOSCOPY N/A 11/26/2014   Procedure: COLONOSCOPY;  Surgeon: Rogene Houston, MD;  [facility];  Service: Endoscopy;  Laterality: N/A;  730  . D & C HYSTEROSCOPY/  RESECTION ENDOMETRIAL MASS/  [place] ENDOMETRIAL ABLATION  04-11-2010  . DX LAPAROSCOPY W/ PARTIAL SMALL BOWEL RESECTION AND APPENDECTOMY  04-13-2007  . ESOPHAGOGASTRODUODENOSCOPY N/A 05/25/2014   Procedure: ESOPHAGOGASTRODUODENOSCOPY (EGD);  Surgeon: Rogene Houston, MD;  [facility];  Service: Endoscopy;  Laterality: N/A;  155  . KNEE ARTHROSCOPY Right 2005  . LAPAROSCOPIC ASSISTED VAGINAL HYSTERECTOMY  10-13-2010   w/ Bx Left Fallopian tube and Aspiration Right Ovarian Cyst  . LAPAROSCOPIC CHOLECYSTECTOMY  11-17-2002  . LAPAROSCOPIC SALPINGO OOPHERECTOMY Bilateral 08/26/2015   Procedure: LAPAROSCOPIC SALPINGO OOPHORECTOMY, bilateral;  Surgeon: Everlene Farrier, MD;  Location: [facility];  Service: Gynecology;  Laterality: Bilateral;  . PORTACATH PLACEMENT  05-17-2014  . REDUCTION MAMMAPLASTY    . SHOULDER ARTHROSCOPY WITH ROTATOR CUFF REPAIR Right 2002  . TRANSTHORACIC ECHOCARDIOGRAM  05-18-2014   ef 60-65%//   last MUGA  (08-08-2015)  ef  56.6%       SOCIAL HISTORY:  Social History   Socioeconomic History  . Marital status: Married    Spouse name: Not on file  . Number of children: Not on file  . Years of education: Not on file  . Highest education level: Not on file  Social Needs  . Financial resource strain: Not on file  . Food insecurity - worry: Not on file  . Food insecurity - inability: Not on file  . Transportation needs - medical: Not on file  . Transportation needs - non-medical: Not on file  Occupational History  . Not on file  Tobacco Use  . Smoking status: Former Smoker    Types: Cigarettes    Last attempt to quit: 08/20/1994    Years since quitting: 22.8  . Smokeless tobacco: Never Used  Substance and Sexual Activity  . Alcohol use: Yes    Comment: Occasionally  . Drug use: No  . Sexual activity: Yes    Partners: Male    Birth control/protection: Surgical, Condom  Other Topics Concern  . Not on file  Social History Narrative  . Not on file    FAMILY HISTORY:  Family History  Problem Relation Age of Onset  . Diabetes Father   . Heart attack Maternal Grandmother 30       multiple over lifetime.  Marland Kitchen  Cancer Maternal Grandmother 78       NOS  . Prostate cancer Maternal Grandfather        dx in his 18s  . Lung cancer Paternal Grandfather        dx <50  . Lymphoma Maternal Aunt        dx in her 71s  . Melanoma Cousin 3       maternal first cousin  . Brain cancer Cousin        paternal first cousin dx under 64  . Prostate cancer Other        MGF's father  . Colon cancer Other        MGM's mother    CURRENT MEDICATIONS:  Outpatient Encounter Medications as of 06/04/2017  Medication Sig  . acetaminophen (TYLENOL) 325 MG tablet Take 650 mg by mouth every 6 (six) hours as needed for moderate pain or headache.  . calcium carbonate (TUMS - DOSED IN MG ELEMENTAL CALCIUM) 500 MG chewable tablet Chew 1 tablet by mouth daily.  . Calcium-Phosphorus-Vitamin D (CALCIUM/D3 ADULT GUMMIES PO)  Take 2 tablets by mouth 2 (two) times daily. dosage of calcium '1200mg'$  and vitamin d '1000mg'$   . desvenlafaxine (PRISTIQ) 100 MG 24 hr tablet Take 1 tablet (100 mg total) by mouth daily.  . Difluprednate (DUREZOL) 0.05 % EMUL Apply to eye 2 (two) times daily.  . diphenhydrAMINE (BENADRYL) 25 mg capsule Take 25 mg by mouth every 6 (six) hours as needed for itching.  . furosemide (LASIX) 20 MG tablet Take 20 mg by mouth 2 (two) times daily.  Marland Kitchen gabapentin (NEURONTIN) 300 MG capsule Take 2 capsules (600 mg total) by mouth at bedtime.  Marland Kitchen ketorolac (ACULAR) 0.4 % SOLN INSTILL ONE DROP IN THE RIGHT EYE FOUR TIMES DAILY  . lapatinib (TYKERB) 250 MG tablet Take 5 tablets (1,250 mg total) by mouth daily.  Marland Kitchen lidocaine-prilocaine (EMLA) cream Apply 1 application topically daily as needed (apply to port before chemo).  . LORazepam (ATIVAN) 0.5 MG tablet Take 1 tablet (0.5 mg total) by mouth every 6 (six) hours as needed for anxiety.  . magic mouthwash SOLN Swish and swallow 25m four times a day.  . Melatonin 10 MG CAPS Take 1 tablet by mouth. Pt states she takes one tablet at night for sleep  . methylphenidate (RITALIN) 20 MG tablet Take 1 tablet (20 mg total) by mouth 2 (two) times daily with breakfast and lunch.  . morphine (MS CONTIN) 30 MG 12 hr tablet Take 1 tablet (30 mg total) by mouth every 12 (twelve) hours.  .Marland KitchenNARCAN 4 MG/0.1ML LIQD nasal spray kit CALL 911. ADMINISTER A SINGLE SPRAY OF NARCAN IN ONE NOSTRIL, REPEAT EVERY 3 MINUTES AS NEEDED IF NO OR MINIMAL RESPONSE  . ondansetron (ZOFRAN) 8 MG tablet Take 1 tablet (8 mg total) by mouth every 8 (eight) hours as needed for nausea or vomiting.  .Marland KitchenoxyCODONE (OXY IR/ROXICODONE) 5 MG immediate release tablet Take 1-2 tablets (5-10 mg total) by mouth every 4 (four) hours as needed for moderate pain.  . pantoprazole (PROTONIX) 40 MG tablet Take 1 tablet (40 mg total) by mouth every 12 (twelve) hours.  . potassium chloride SA (K-DUR,KLOR-CON) 20 MEQ tablet Take  1 tablet (20 mEq total) by mouth 3 (three) times daily.  . promethazine (PHENERGAN) 25 MG tablet TAKE 1 TABLET BY MOUTH EVERY SIX HOURS AS NEEDED FOR NAUSEA OR VOMITING  . rivaroxaban (XARELTO) 20 MG TABS tablet TAKE 1 TABLET BY MOUTH EVERY  DAY WITH SUPPER  . spironolactone (ALDACTONE) 25 MG tablet Take 1 tablet (25 mg total) by mouth daily.  . XELODA 500 MG tablet Take 1500 mg PO BID, 7 days on and 7 days off  . Zoledronic Acid (ZOMETA) 4 MG/100ML IVPB Inject 100 mLs (4 mg total) into the vein every 30 (thirty) days.  Marland Kitchen zolpidem (AMBIEN) 10 MG tablet TAKE ONE TABLET BY MOUTH AT BEDTIME AS NEEDED   No facility-administered encounter medications on file as of 06/04/2017.     ALLERGIES:  No Known Allergies   PHYSICAL EXAM:  ECOG Performance status: 1 - Symptomatic, but independent.   Vitals:   06/04/17 1347  BP: 123/83  Pulse: 89  Resp: 20  Temp: 98.1 F (36.7 C)  SpO2: 97%   Filed Weights   06/04/17 1347  Weight: 262 lb (118.8 kg)      Physical Exam  Constitutional: She is oriented to person, place, and time and well-developed, well-nourished, and in no distress.  HENT:  Head: Normocephalic.  Mouth/Throat: Oropharynx is clear and moist.  Eyes: Conjunctivae are normal. No scleral icterus.  Neck: Normal range of motion.  Pulmonary/Chest: Effort normal.  Musculoskeletal: Normal range of motion.  Neurological: She is alert and oriented to person, place, and time. Gait normal.  Skin: Skin is warm and dry.  Psychiatric: Mood, memory, affect and judgment normal.  Nursing note and vitals reviewed.    LABORATORY DATA:  I have reviewed the labs as listed.  CBC    Component Value Date/Time   WBC 6.1 05/26/2017 1337   RBC 4.38 05/26/2017 1337   HGB 12.1 05/26/2017 1337   HCT 39.1 05/26/2017 1337   PLT 237 05/26/2017 1337   MCV 89.3 05/26/2017 1337   MCH 27.6 05/26/2017 1337   MCHC 30.9 05/26/2017 1337   RDW 18.1 (H) 05/26/2017 1337   LYMPHSABS 1.3 05/26/2017 1337    MONOABS 0.4 05/26/2017 1337   EOSABS 0.0 05/26/2017 1337   BASOSABS 0.0 05/26/2017 1337   CMP Latest Ref Rng & Units 05/26/2017 04/28/2017 03/31/2017  Glucose 65 - 99 mg/dL 144(H) 128(H) 105(H)  BUN 6 - 20 mg/dL '12 13 7  '$ Creatinine 0.44 - 1.00 mg/dL 0.97 0.86 0.87  Sodium 135 - 145 mmol/L 136 138 135  Potassium 3.5 - 5.1 mmol/L 3.7 3.7 3.6  Chloride 101 - 111 mmol/L 97(L) 100(L) 98(L)  CO2 22 - 32 mmol/L '28 26 29  '$ Calcium 8.9 - 10.3 mg/dL 9.6 9.3 9.4  Total Protein 6.5 - 8.1 g/dL 7.8 7.2 6.9  Total Bilirubin 0.3 - 1.2 mg/dL 0.7 0.3 0.8  Alkaline Phos 38 - 126 U/L 96 76 94  AST 15 - 41 U/L '26 24 30  '$ ALT 14 - 54 U/L '17 17 21         '$ PENDING LABS:     DIAGNOSTIC IMAGING:  Most recent PET scan: 03/24/17 CLINICAL DATA:  Subsequent treatment strategy for metastatic breast cancer.  EXAM: NUCLEAR MEDICINE PET SKULL BASE TO THIGH  TECHNIQUE: 13.0 mCi F-18 FDG was injected intravenously. Full-ring PET imaging was performed from the skull base to thigh after the radiotracer. CT data was obtained and used for attenuation correction and anatomic localization.  FASTING BLOOD GLUCOSE:  Value: 110 mg/dl  COMPARISON:  01/20/2017  FINDINGS: NECK: No hypermetabolic lymph nodes in the neck.  CHEST: No hypermetabolic mediastinal or hilar nodes. No suspicious pulmonary nodules on the CT scan.  ABDOMEN/PELVIS: No abnormal hypermetabolic activity within the liver, pancreas, adrenal glands, or  spleen. No hypermetabolic lymph nodes in the abdomen or pelvis.  SKELETON: Multifocal hypermetabolic skeletal metastases are again noted. The index lesion within the right glenoid has an SUV max equal to 15.9. Previously 12.3. The T7 lesion within the T7 vertebra has an SUV max equal to 7.95. Previously 5.7. Hypermetabolic lesion involving the T10 vertebra and extending into the left posterior elements has an SUV max of 11.82. Previously 8.3. Index lesion within the left iliac  crest has an SUV max equal to 15.27. Previously 16.86. Hypermetabolic lesion within the right hip has an SUV max of 17.06. Previously 16.23.  IMPRESSION: 1. No evidence for hypermetabolic soft tissue metastases. 2. No significant change and hypermetabolic bone metastasis with some mild variability in degree of FDG uptake.   Electronically Signed   By: Kerby Moors M.D.   On: 03/24/2017 15:49     Most recent ECHO: 04/27/17  *[place], [place] [address]                            [phone]  ------------------------------------------------------------------- Transthoracic Echocardiography  Patient:    [name], [name] MR #:       [id number] Study Date: 04/27/2017 Gender:     F Age:        8 Height:     172.7 cm Weight:     117.5 kg BSA:        2.43 m^2 Pt. Status: Room:   SONOGRAPHER  [facility]  PERFORMING   [facility], Forestine Na  ATTENDING    Laurelyn Sickle, Elzie Rings W  REFERRING    Mike Craze W  cc:  ------------------------------------------------------------------- LV EF: 60% -   65%  ------------------------------------------------------------------- History:   PMH:  Metastatic Breast Cancer, Drug Induced Cardiomyopathy, Bone and Brain Metastases, Former Smoker.  ------------------------------------------------------------------- Study Conclusions  - Left ventricle: The cavity size was normal. Wall thickness was   increased in a pattern of mild LVH. Systolic function was normal.   The estimated ejection fraction was in the range of 60% to 65%.   Normal global longitudinal strain of -22.5%. Wall motion was   normal; there were no regional wall motion abnormalities. Left   ventricular diastolic function parameters were normal. - Aortic valve: Mildly calcified annulus. Trileaflet. - Right atrium: Central venous pressure  (est): 3 mm Hg. - Atrial septum: No defect or patent foramen ovale was identified. - Tricuspid valve: There was trivial regurgitation. - Pulmonary arteries: Systolic pressure could not be accurately   estimated. - Pericardium, extracardiac: There was no pericardial effusion.  Impressions:  - Mild LVH with LVEF 60-65% and normal diastolic function. Normal   global longitudinal strain of -22.5%. Mildly calcified aortic   annulus. Trivial tricuspid regurgitation.     MRI brain: 06/02/17 CLINICAL DATA:  Headache for 4 weeks. History of breast cancer with brain metastases.  EXAM: MRI HEAD WITHOUT AND WITH CONTRAST  TECHNIQUE: Multiplanar, multiecho pulse sequences of the brain and surrounding structures were obtained without and with intravenous contrast.  CONTRAST:  33m MULTIHANCE GADOBENATE DIMEGLUMINE 529 MG/ML IV SOLN  COMPARISON:  11/02/2016  FINDINGS:  Brain: A new focus of chronic microhemorrhage is present in the parasagittal left frontoparietal region, and a metastasis was present in this location in 09/2014. A punctate focus of susceptibility artifact more anteriorly in the left centrum semiovale may represent an additional chronic microhemorrhage or be vascular. Mild T2 hyperintensity in the periventricular white matter bilaterally is unchanged and may reflect sequelae of prior radiation therapy and/or chronic small vessel ischemia. A 4 mm nonenhancing T2 hyperintense focus in the right caudate head and a similar 3 mm focus inferiorly in the left cerebellum are unchanged and correspond to previously treated metastases. No enhancing brain lesions are identified.  There is no evidence of acute infarct, midline shift, or extra-axial fluid collection. Cerebral volume is unchanged and within normal limits for age. A mildly expanded partially empty sella is unchanged.  Vascular: Major intracranial vascular flow voids are preserved.  Skull and upper  cervical spine: No suspicious marrow lesion.  Sinuses/Orbits: Bilateral cataract extraction. Paranasal sinuses and mastoid air cells are clear.  Other: None.  IMPRESSION: No evidence of recurrent brain metastases or acute intracranial abnormality.   Electronically Signed   By: Logan Bores M.D.   On: 06/02/2017 14:44     PATHOLOGY:  [facility]: 05/25/14            ASSESSMENT & PLAN:   Stage IV metastatic breast adenocarcinoma with liver, bone, brain, and spine mets; ER+/HER2+: -Currently taking Xeloda 1500 mg BID 7 days on and 7 days off with Tykerb daily.  -Tumor markers (CA 27-29 & CA 15-3) continue to rise. Her last restaging PET scan in 03/2017 showed overall stable disease without evidence of any visceral/soft tissue mets; bony lesions are largely unchanged/stable with varying degrees to hypermetabolism.   -She has concerns re: her current treatment course. Endorses increased anxiety about her tumor markers increasing. She does not like how she feels "on my chemo weeks." She is frustrated by only have 2 weeks per month "of feeling normal."  She tells me that she has to plan her activities with family and friends around her "off weeks" of chemo.  -Ms. Mckeone would like to consider taking a "drug holiday" from her therapy. We discussed the risks/benefits of temporarily stopping therapy. The biggest risk would be progression of disease. Given that she has anxiety about her tumor markers increasing, we discussed how temporarily stopping therapy may affect her anxiety. She tells me that she "would be fine with it if it was just for a few weeks."  After discussed the risks/benefits, she decided to temporarily stop the Xeloda for the next 1 month. Recommended she continue the Tykerb because she tolerates this medication well and shared with her that I would prefer she stay on at least 1 therapy. She agreed.  -Lastly, we discussed her overall goals of care and treatment  plan.  I offered her the option of 2nd opinion at a teaching institution like [facility], [facility], or other institution of her choice. She asked for my recommendation. I would recommend she see Dr. Verlan Friends at [facility] for 2nd opinion.  She agrees to this referral. I explained to her that we would initiate the referral and that [place] may want additional work-up, testing, imaging, etc before or after her consultation. She agrees to proceed with referral.    Headaches: -History of brain mets. Headaches are improving.  -MRI brain from 06/02/17 reviewed with patient. No evidence of new or progressive brain mets.    Fatigue/Sleep disturbance:  -Sleep study completed  on 02/23/17 (ordered by psychiatry), showed severe sleep apnea.  She has been using CPAP machine consistently.  -Continue Ambien at bedtime as needed.   Hypokalemia/Chronic LE edema:  -Hypokalemia resolved. Continue Aldactone for diuresis.   Bone mets:  -Continue monthly Zometa.   Cancer-related fatigue & Depression/Anxiety:  -Continue current medication regimen per psychiatry with Pristiq daily and Ativan PRN.  -Continue follow-up with psychiatry as directed. -Continue Ritalin 20 mg BID, although she historically only takes once per day.  She generally does not take the medication BID, but it is helpful with first dose in the mornings.    In the future, I would be reluctant to further increase the dose given tachycardia and possibility of cardiotoxicity with Tykerb. -Dr. Daron Offer with psychiatry recently refilled Ritalin & Pristiq for patient. I am happy to have Dr. Joycelyn Schmid team assume responsibility of managing her fatigue and depression medications.   Chronic pain secondary to neoplasm:  -MS Contin effective in managing her pain.  Maintain current dose of MS Contin for basal pain control with oxycodone PRN. Bowels reportedly moving well.           Dispo:  -Hold Xeloda for the next 1 month per patient wishes.  -Return to  cancer center as scheduled for next Zometa infusion.  -Refer to [facility] for 2nd opinion.     All questions were answered to patient's stated satisfaction. Encouraged patient to call with any new concerns or questions before her next visit to the cancer center and we can certain see her sooner, if needed.    Plan of care discussed with Dr. Sherrine Maples, who agrees with the above aforementioned.    A total of 30 minutes was spent in face-to-face care of this patient, with greater than 50% of that time spent in counseling and care-coordination.    Orders placed this encounter:  No orders of the defined types were placed in this encounter.      Mike Craze, NP [place] [phone]

## 2017-06-04 NOTE — Patient Instructions (Signed)
[place] at [facility] Discharge Instructions  RECOMMENDATIONS MADE BY THE CONSULTANT AND ANY TEST RESULTS WILL BE SENT TO YOUR REFERRING PHYSICIAN.  You were seen today by Mike Craze, NP  Thank you for choosing [place] at [facility] to provide your oncology and hematology care.  To afford each patient quality time with our provider, please arrive at least 15 minutes before your scheduled appointment time.    If you have a lab appointment with the [place] please come in thru the  Main Entrance and check in at the main information desk  You need to re-schedule your appointment should you arrive 10 or more minutes late.  We strive to give you quality time with our providers, and arriving late affects you and other patients whose appointments are after yours.  Also, if you no show three or more times for appointments you may be dismissed from the clinic at the providers discretion.     Again, thank you for choosing [facility].  Our hope is that these requests will decrease the amount of time that you wait before being seen by our physicians.       _____________________________________________________________  Should you have questions after your visit to [facility], please contact our office at [phone] between the hours of 8:30 a.m. and 4:30 p.m.  Voicemails left after 4:30 p.m. will not be returned until the following business day.  For prescription refill requests, have your pharmacy contact our office.       Resources For Cancer Patients and their Caregivers ? American Cancer Society: Can assist with transportation, wigs, general needs, runs Look Good Feel Better.        934-809-5342 ? Cancer Care: Provides financial assistance, online support groups, medication/co-pay assistance.  1-800-813-HOPE 864-298-1699) ? [place] Assists [place] cancer patients and their  families through emotional , educational and financial support.  770-221-1977 ? [place] DSS Where to apply for food stamps, Medicaid and utility assistance. (430) 188-9524 ? [facility]: Transportation to medical appointments. 608-531-2995 ? Social Security Administration: May apply for disability if have a Stage IV cancer. 8056293870 (479)440-8567 ? [employer], Disability and Transit Services: Assists with nutrition, care and transit needs. [place] Support Programs: @10RELATIVEDAYS @ > Cancer Support Group  2nd Tuesday of the month 1pm-2pm, Journey Room  > Creative Journey  3rd Tuesday of the month 1130am-1pm, Journey Room  > Look Good Feel Better  1st Wednesday of the month 10am-12 noon, Journey Room (Call [place] to register [phone])

## 2017-06-05 ENCOUNTER — Encounter (HOSPITAL_COMMUNITY): Payer: Self-pay | Admitting: Adult Health

## 2017-06-10 DIAGNOSIS — H43813 Vitreous degeneration, bilateral: Secondary | ICD-10-CM | POA: Diagnosis not present

## 2017-06-10 DIAGNOSIS — H34831 Tributary (branch) retinal vein occlusion, right eye, with macular edema: Secondary | ICD-10-CM | POA: Diagnosis not present

## 2017-06-10 DIAGNOSIS — H3563 Retinal hemorrhage, bilateral: Secondary | ICD-10-CM | POA: Diagnosis not present

## 2017-06-10 DIAGNOSIS — H43822 Vitreomacular adhesion, left eye: Secondary | ICD-10-CM | POA: Diagnosis not present

## 2017-06-11 ENCOUNTER — Encounter (HOSPITAL_COMMUNITY): Payer: Self-pay | Admitting: Adult Health

## 2017-06-11 NOTE — Progress Notes (Signed)
Spoke with Caryl Pina, scheduler for Dr. Gerrie Nordmann [place] at [facility].  Patient is scheduled to see Dr. Juanita Craver June 22, 2017 @ 11:45 (pt aware).  New patient paperwork will be sent via [employer] to the patient.  Per Caryl Pina, records were visible to her in Care Everywhere, so no records need be faxed.  If patient needs to reschedule, she may call Caryl Pina directly at [phone].

## 2017-06-18 ENCOUNTER — Encounter (HOSPITAL_COMMUNITY): Payer: Self-pay | Admitting: Psychiatry

## 2017-06-21 ENCOUNTER — Encounter (HOSPITAL_COMMUNITY): Payer: Self-pay | Admitting: Psychiatry

## 2017-06-22 DIAGNOSIS — C7931 Secondary malignant neoplasm of brain: Secondary | ICD-10-CM | POA: Diagnosis not present

## 2017-06-22 DIAGNOSIS — C50919 Malignant neoplasm of unspecified site of unspecified female breast: Secondary | ICD-10-CM | POA: Diagnosis not present

## 2017-06-22 DIAGNOSIS — C7951 Secondary malignant neoplasm of bone: Secondary | ICD-10-CM | POA: Diagnosis not present

## 2017-06-22 DIAGNOSIS — C50912 Malignant neoplasm of unspecified site of left female breast: Secondary | ICD-10-CM | POA: Diagnosis not present

## 2017-06-23 ENCOUNTER — Other Ambulatory Visit: Payer: Self-pay

## 2017-06-23 ENCOUNTER — Encounter (HOSPITAL_COMMUNITY): Payer: Self-pay

## 2017-06-23 ENCOUNTER — Inpatient Hospital Stay (HOSPITAL_COMMUNITY): Payer: Medicare Other

## 2017-06-23 ENCOUNTER — Inpatient Hospital Stay (HOSPITAL_COMMUNITY): Payer: Medicare Other | Attending: Adult Health | Admitting: Adult Health

## 2017-06-23 VITALS — BP 125/70 | HR 77 | Temp 97.8°F | Resp 20 | Wt 262.8 lb

## 2017-06-23 DIAGNOSIS — Z87891 Personal history of nicotine dependence: Secondary | ICD-10-CM | POA: Diagnosis not present

## 2017-06-23 DIAGNOSIS — C7951 Secondary malignant neoplasm of bone: Secondary | ICD-10-CM

## 2017-06-23 DIAGNOSIS — G4730 Sleep apnea, unspecified: Secondary | ICD-10-CM | POA: Diagnosis not present

## 2017-06-23 DIAGNOSIS — Z923 Personal history of irradiation: Secondary | ICD-10-CM | POA: Insufficient documentation

## 2017-06-23 DIAGNOSIS — Z800 Family history of malignant neoplasm of digestive organs: Secondary | ICD-10-CM | POA: Diagnosis not present

## 2017-06-23 DIAGNOSIS — Z801 Family history of malignant neoplasm of trachea, bronchus and lung: Secondary | ICD-10-CM | POA: Insufficient documentation

## 2017-06-23 DIAGNOSIS — Z170 Estrogen receptor positive status [ER+]: Secondary | ICD-10-CM | POA: Insufficient documentation

## 2017-06-23 DIAGNOSIS — Z79899 Other long term (current) drug therapy: Secondary | ICD-10-CM | POA: Diagnosis not present

## 2017-06-23 DIAGNOSIS — Z8701 Personal history of pneumonia (recurrent): Secondary | ICD-10-CM | POA: Diagnosis not present

## 2017-06-23 DIAGNOSIS — K219 Gastro-esophageal reflux disease without esophagitis: Secondary | ICD-10-CM | POA: Insufficient documentation

## 2017-06-23 DIAGNOSIS — Z9221 Personal history of antineoplastic chemotherapy: Secondary | ICD-10-CM | POA: Insufficient documentation

## 2017-06-23 DIAGNOSIS — R5382 Chronic fatigue, unspecified: Secondary | ICD-10-CM

## 2017-06-23 DIAGNOSIS — C7931 Secondary malignant neoplasm of brain: Secondary | ICD-10-CM | POA: Diagnosis not present

## 2017-06-23 DIAGNOSIS — Z8719 Personal history of other diseases of the digestive system: Secondary | ICD-10-CM | POA: Diagnosis not present

## 2017-06-23 DIAGNOSIS — Z809 Family history of malignant neoplasm, unspecified: Secondary | ICD-10-CM | POA: Diagnosis not present

## 2017-06-23 DIAGNOSIS — Z0000 Encounter for general adult medical examination without abnormal findings: Secondary | ICD-10-CM

## 2017-06-23 DIAGNOSIS — C50919 Malignant neoplasm of unspecified site of unspecified female breast: Secondary | ICD-10-CM

## 2017-06-23 DIAGNOSIS — C801 Malignant (primary) neoplasm, unspecified: Secondary | ICD-10-CM | POA: Diagnosis not present

## 2017-06-23 DIAGNOSIS — Z86718 Personal history of other venous thrombosis and embolism: Secondary | ICD-10-CM | POA: Diagnosis not present

## 2017-06-23 DIAGNOSIS — C50911 Malignant neoplasm of unspecified site of right female breast: Secondary | ICD-10-CM | POA: Insufficient documentation

## 2017-06-23 DIAGNOSIS — H538 Other visual disturbances: Secondary | ICD-10-CM

## 2017-06-23 DIAGNOSIS — G893 Neoplasm related pain (acute) (chronic): Secondary | ICD-10-CM | POA: Diagnosis not present

## 2017-06-23 DIAGNOSIS — Z806 Family history of leukemia: Secondary | ICD-10-CM | POA: Insufficient documentation

## 2017-06-23 DIAGNOSIS — F329 Major depressive disorder, single episode, unspecified: Secondary | ICD-10-CM | POA: Diagnosis not present

## 2017-06-23 DIAGNOSIS — C787 Secondary malignant neoplasm of liver and intrahepatic bile duct: Secondary | ICD-10-CM | POA: Insufficient documentation

## 2017-06-23 DIAGNOSIS — F419 Anxiety disorder, unspecified: Secondary | ICD-10-CM

## 2017-06-23 DIAGNOSIS — C229 Malignant neoplasm of liver, not specified as primary or secondary: Secondary | ICD-10-CM

## 2017-06-23 DIAGNOSIS — Z86010 Personal history of colonic polyps: Secondary | ICD-10-CM | POA: Diagnosis not present

## 2017-06-23 DIAGNOSIS — R5383 Other fatigue: Secondary | ICD-10-CM

## 2017-06-23 LAB — CBC WITH DIFFERENTIAL/PLATELET
BASOS ABS: 0 10*3/uL (ref 0.0–0.1)
BASOS PCT: 0 %
Eosinophils Absolute: 0 10*3/uL (ref 0.0–0.7)
Eosinophils Relative: 0 %
HEMATOCRIT: 35.6 % — AB (ref 36.0–46.0)
HEMOGLOBIN: 10.9 g/dL — AB (ref 12.0–15.0)
Lymphocytes Relative: 21 %
Lymphs Abs: 1.1 10*3/uL (ref 0.7–4.0)
MCH: 27.1 pg (ref 26.0–34.0)
MCHC: 30.6 g/dL (ref 30.0–36.0)
MCV: 88.6 fL (ref 78.0–100.0)
Monocytes Absolute: 0.4 10*3/uL (ref 0.1–1.0)
Monocytes Relative: 8 %
NEUTROS ABS: 3.5 10*3/uL (ref 1.7–7.7)
NEUTROS PCT: 71 %
Platelets: 251 10*3/uL (ref 150–400)
RBC: 4.02 MIL/uL (ref 3.87–5.11)
RDW: 18 % — ABNORMAL HIGH (ref 11.5–15.5)
WBC: 5 10*3/uL (ref 4.0–10.5)

## 2017-06-23 LAB — COMPREHENSIVE METABOLIC PANEL
ALT: 17 U/L (ref 14–54)
ANION GAP: 10 (ref 5–15)
AST: 20 U/L (ref 15–41)
Albumin: 3.7 g/dL (ref 3.5–5.0)
Alkaline Phosphatase: 91 U/L (ref 38–126)
BUN: 10 mg/dL (ref 6–20)
CALCIUM: 9.2 mg/dL (ref 8.9–10.3)
CO2: 27 mmol/L (ref 22–32)
Chloride: 99 mmol/L — ABNORMAL LOW (ref 101–111)
Creatinine, Ser: 0.83 mg/dL (ref 0.44–1.00)
GFR calc non Af Amer: >60 mL/min (ref >60)
Glucose, Bld: 103 mg/dL — ABNORMAL HIGH (ref 65–99)
Potassium: 3.7 mmol/L (ref 3.5–5.1)
SODIUM: 136 mmol/L (ref 135–145)
Total Bilirubin: 0.2 mg/dL — ABNORMAL LOW (ref 0.3–1.2)
Total Protein: 7.2 g/dL (ref 6.5–8.1)

## 2017-06-23 LAB — HEMOGLOBIN A1C
HEMOGLOBIN A1C: 5.9 % — AB (ref 4.8–5.6)
MEAN PLASMA GLUCOSE: 122.63 mg/dL

## 2017-06-23 MED ORDER — LORAZEPAM 0.5 MG PO TABS: 0.5000 mg | ORAL_TABLET | Freq: Four times a day (QID) | ORAL | 2 refills | Status: DC | PRN

## 2017-06-23 MED ORDER — MORPHINE SULFATE ER 30 MG PO TBCR: 30.0000 mg | EXTENDED_RELEASE_TABLET | Freq: Two times a day (BID) | ORAL | 0 refills | Status: DC

## 2017-06-23 MED ORDER — HEPARIN SOD (PORK) LOCK FLUSH 100 UNIT/ML IV SOLN
INTRAVENOUS | Status: AC
  Filled 2017-06-23: qty 5

## 2017-06-23 MED ORDER — ZOLEDRONIC ACID 4 MG/100ML IV SOLN
4.0000 mg | Freq: Once | INTRAVENOUS | Status: AC
  Administered 2017-06-23: 4 mg via INTRAVENOUS
  Filled 2017-06-23: qty 100

## 2017-06-23 MED ORDER — HEPARIN SOD (PORK) LOCK FLUSH 100 UNIT/ML IV SOLN
500.0000 [IU] | Freq: Once | INTRAVENOUS | Status: AC | PRN
  Administered 2017-06-23: 500 [IU]

## 2017-06-23 MED ORDER — SODIUM CHLORIDE 0.9 % IV SOLN
INTRAVENOUS | Status: DC
  Administered 2017-06-23: 15:00:00 via INTRAVENOUS

## 2017-06-23 NOTE — Progress Notes (Signed)
Tolerated infusion w/o adverse reaction.  Alert, in no distress.  VSS.  Discharged ambulatory in c/o spouse.  

## 2017-06-24 LAB — CANCER ANTIGEN 27.29: CAN 27.29: 94.1 U/mL — AB (ref 0.0–38.6)

## 2017-06-27 ENCOUNTER — Encounter (HOSPITAL_COMMUNITY): Payer: Self-pay | Admitting: Adult Health

## 2017-06-27 NOTE — Progress Notes (Signed)
[place], [place] [address]   CLINIC:  Medical Oncology/Hematology  PCP:  [name], MD [place] [address] [phone]   REASON FOR VISIT:  Follow-up for Stage IV metastatic breast adenocarcinoma with liver, bone, brain, and spine mets; ER+/HER2+   CURRENT THERAPY: Xeloda 1500 mg AM and 1500 mg PM, 7 days on/7 days off schedule (currently ON HOLD per pt wishes as of 06/04/17) with Tykerb daily & Zometa monthly   BRIEF ONCOLOGIC HISTORY:    Breast cancer, stage 4 ([place])   04/25/2014 Initial Diagnosis    Breast cancer, stage 4      04/25/2014 Imaging    CT abdomen/pelvis with widespread metastatic disease to the liver, multiple lytic lesions throughout spine and pelvis. No FX or epidural tumor identified      04/26/2014 Imaging    CT head unremarkable      04/26/2014 Imaging    CT chest with no lung mass or pulmonary nodules, no adenopathy. Lytic bone lesions, right 2nd rib      04/27/2014 Initial Biopsy    U/S guided liver biopsy, lesion in anterior and inferior left hepatic lobe biopsied      04/27/2014 Pathology Results    Metastatic adenocarcinoma, CK7, ER+, patchy positivity with PR. Possible primary includes breast, less likely gynecologic      05/15/2014 Mammogram    BI-RADS CATEGORY  2: Benign Finding(s)      05/16/2014 PET scan    1. Intensely hypermetabolic hepatic metastasis. 2. Widespread hypermetabolic skeletal lesions. 3. No primary adenocarcinoma identified by FDG PET imaging.      05/19/2014 Imaging    MUGA- Left ventricular ejection fracture greater than 70%.      05/21/2014 Breast MRI    No suspicious masses or enhancement within the breasts. No axillary adenopathy.      05/22/2014 - 07/03/2014 Antibody Plan    Herceptin/Perjeta/Tamoxifen      06/12/2014 - 07/03/2014 Chemotherapy    Taxotere added secondary to persistent abdominal and back pain      06/17/2014 - 06/19/2014 Hospital  Admission    Neutropenia, fever, diarrhea, nausea, vomiting      06/20/2014 - 07/10/2014 Radiation Therapy    Dr. Thea Silversmith 12 fractions to L3-S3 (30 Gy) and left scapula (20 Gy).       07/03/2014 Adverse Reaction    Perjeta- induced diarrhea.  Perjeta discontinued      07/16/2014 - 07/20/2014 Hospital Admission    Electrolyte abnormalities, and diarrhea.  Suspect Perjeta-induced diarrhea.  Negative GI work-up.      07/24/2014 - 08/19/2015 Chemotherapy    Herceptin/Tamoxifen/Xgeva      08/21/2014 Imaging    MUGA- Left ventricular ejection fraction equals 71%.      08/24/2014 PET scan    Dramatic reduction in metabolic activity of the widespread liver metastasis. Liver metastasis now have metabolic activity equal to background normal liver activity. Liver has a nodular contour. Marked reduction in metabolic activity of skeletal lesions..      10/05/2014 Progression    Widespread metastatic disease to the brain as described. Between 20 and 30 intracranial metastatic deposits are now seen. No midline shift or incipient herniation      10/09/2014 - 10/26/2014 Radiation Therapy    Whole Brain XRT      11/14/2014 Imaging    MUGA- LVEF 67%      02/13/2015 Imaging    MUGA- LVEF 59%  02/15/2015 Treatment Plan Change    Due to declining LVEF, will hold Herceptin per PI guidelines.      04/12/2015 -  Chemotherapy    Herceptin restarted      06/02/2015 - 06/08/2015 Hospital Admission    Pneumonia      07/05/2015 Progression     PET/CT concern for mild progression of skeletal metastasis with several lesions within the spine and 1 lesion in the Left iliac wing with mild to moderate metabolic activity new from prior. Rising CA 27-29      07/16/2015 - 10/23/2015 Anti-estrogen oral therapy    Arimidex      07/16/2015 Imaging    MRI brain with satisfactory post treatment apperance of brain. interval resolved enhancing R caudate metastasis, minimal punctate residual enhancing metastatic  disease at the inferior L cerebellum. No new metastatic disease or new intracranial abnormality      07/19/2015 Treatment Plan Change    Discontinue Tamoxifen, Zoladex plus Arimidex.       08/27/2015 Procedure    Laparoscopic bilateral salpingo-oophorectomy by Dr. Gaetano Net      10/17/2015 PET scan    Osseous metastatic disease appears slightly progressive based on a new right scapular lesion and increased uptake within lesions in the thoracic spine, left iliac wing and  proximal right femur.      10/17/2015 Progression    Slight progression on PET scan imaging.      10/18/2015 Imaging    REsolved enhancing metastatic disease to the brain status post WBXRT      10/23/2015 - 02/12/2016 Adjuvant Chemotherapy    Faslodex loading followed by maintenance dose.  (Herceptin continued)      11/04/2015 Imaging    MUGA- LEFT ventricular ejection fraction 51% slightly decreased in a 57% on the previous exam.      12/24/2015 Treatment Plan Change    Zometa every 28 days.  Xgeva discontinued.        01/01/2016 Imaging    MUGA- Left ventricular ejection fraction equals 57.9%. This is increased from 51.1% previously.      02/03/2016 PET scan    1. Mixed metabolic changes in the scattered hypermetabolic sclerotic osseous metastases throughout the axial and proximal appendicular skeleton as detailed above. 2. No new sites of hypermetabolic metastatic disease. Stable pseudo-cirrhotic appearance of the liver due to treated liver metastases with no hypermetabolic liver metastases.       02/03/2016 Progression    PET shows mixed osseous response.  Some lesions more hypermetabolic, others improved.      02/05/2016 Treatment Plan Change    D/C Herceptin.  Faslodex as scheduled on 02/12/2016, then discontinued.  Continue Zometa.      02/05/2016 Treatment Plan Change    Prescriptions for Xeloda 7 days on and 7 days off and Tykerb printed and provided for authorization.      02/19/2016 -   Chemotherapy    Xeloda 2300 mg BID 7 days on and 7 days off and Tykerb       02/24/2016 Treatment Plan Change    Xeloda dose reduced by 10% to 2000 mg BID week on and week off.      03/18/2016 Treatment Plan Change    Xeloda dose reduced to 2000 mg in AM and 1500 mg in PM 7 days on and 7 days off.      03/23/2016 Imaging    MUGA- Normal LEFT ventricular ejection fraction of 56% not significantly changed from 58% on previous exam.  05/19/2016 Imaging    MRI brain- . No new focus of abnormal enhancement to suggest interval metastatic disease. 2. No acute intracranial abnormality. 3. Stable foci of T2 FLAIR hyperintensity in white matter and in the right caudate head compatible with posttreatment changes and treated metastasis.      05/20/2016 PET scan    1. The multiple osseous metastatic lesions shown to be hypermetabolic on the prior exam are reduced in activity or even resolved in hypermetabolic activity compared to prior, as detailed above. There are also numerous sclerotic bony lesions wedge are not currently and were not previously hypermetabolic, compatible with old non active lesions. 2. No findings of extra osseous metastatic disease currently. 3. Hepatic pseudocirrhosis. 4. Healing rib fractures. Chronic pathologic fracture the left acromion.      07/08/2016 Imaging    MUGA- Low normal LEFT ventricular ejection fraction of 53%, little changed since the 56% on 03/23/2016 but slightly decreased from the 58% on 01/01/2016.      09/07/2016 Echocardiogram    MUGA- Normal LEFT ventricular ejection fraction of 59% with normal LV wall motion.      09/07/2016 Treatment Plan Change    Xeloda dose reduced to 1500 mg BID 7 days on and 7 days off.      09/15/2016 PET scan    No significant change in diffuse sclerotic bone metastases on CT images, although several show mildly increased FDG uptake since prior study.  No evidence of soft tissue metastatic disease.       11/02/2016 Imaging    MRI brain- No change from the prior study. Previously noted metastatic deposits have been treated. No new lesions identified.        INTERVAL HISTORY:  [name] 52 y.o. female presents for continued follow-up for metastatic breast cancer.   Here today with her husband.    Overall, she tells me she has been feeling much better since holding her Xeloda for the past ~2 weeks. Her energy levels and appetite are improved. Her diarrhea has resolved. "I feel the best I have felt in a long time."    She saw Dr. Juanita Craver at [facility] yesterday for 2nd opinion. She tells me that he recommended continuing to hold Xeloda, resume Herceptin, and change Zometa to every 3 months instead of month (we are awaiting formal documentation from Dr. Lorane Gell visit with his recommendations at this time).    She tells me that Dr. Daron Offer with psychiatry would like to wean her down and off of Ritalin.  She is looking forward to resuming the [employer] at the [facility]. Her fatigue has improved quite a bit since being diagnosed with sleep apnea and consistently using her CPAP at home.   Requesting refills of MS Contin and Ativan today.  Otherwise, she is largely without other complaints today.     REVIEW OF SYSTEMS:  Review of Systems - Oncology, per HPI. Otherwise, 14-point ROS completed and negative.      PAST MEDICAL/SURGICAL HISTORY:  Past Medical History:  Diagnosis Date  . Anticoagulated    xarelto  . Anxiety   . Breast cancer metastasized to multiple sites [facility])    liver, brain, and bone  . Breast cancer, stage 4 [facility]) oncologist-  dr Larene [place] ([facility])   dx 12/ 2015 -- breast cancer Stage 4,  ER/HER2 +,  w/  liver, brain and  bone mets/  chemotherapy and radiation therapy  . Chronic pain syndrome    secondary to cancer   .  Depression   . Drug-induced cardiomyopathy ([place])    per last MUGA (08-08-2015), ef 56.5/ per last echo 05-18-2014 ef 60-65%  . Family  history of prostate cancer   . GERD (gastroesophageal reflux disease)   . History of colon polyps    07-13-2013  benign  . History of DVT (deep vein thrombosis)    07-09-2014  upper right extremity-  RIJ and right subclavian--  resolved  . History of gastritis    erosive  . History of pneumonia    HCAP 06-07-2015--  resolved per cxr 07-04-2015  . History of radiation therapy    12 fractions to L3 - S3, 30Gy and left spacula 20Gy (06-20-2014 to 07-10-2014) //  whole brain rxt (10-09-2014 to 10-26-2014)  . History of small bowel obstruction    S/P RESECTION 2008  . Migraine   . PONV (postoperative nausea and vomiting)    pt states scope patch does well   Past Surgical History:  Procedure Laterality Date  . BREAST REDUCTION SURGERY  03/17/2011   Procedure: MAMMARY REDUCTION BILATERAL (BREAST);  Surgeon: Mary A Contogiannis;  Location: [facility];  Service: Plastics;  Laterality: Bilateral;  . CATARACT EXTRACTION W/ INTRAOCULAR LENS  IMPLANT, BILATERAL  2008  . CERVICAL FUSION  2003   C5 -- C6  . COLONOSCOPY N/A 07/13/2013   Procedure: COLONOSCOPY;  Surgeon: Rogene Houston, MD;  [facility];  Service: Endoscopy;  Laterality: N/A;  930  . COLONOSCOPY N/A 11/26/2014   Procedure: COLONOSCOPY;  Surgeon: Rogene Houston, MD;  [facility];  Service: Endoscopy;  Laterality: N/A;  730  . D & C HYSTEROSCOPY/  RESECTION ENDOMETRIAL MASS/  [place] ENDOMETRIAL ABLATION  04-11-2010  . DX LAPAROSCOPY W/ PARTIAL SMALL BOWEL RESECTION AND APPENDECTOMY  04-13-2007  . ESOPHAGOGASTRODUODENOSCOPY N/A 05/25/2014   Procedure: ESOPHAGOGASTRODUODENOSCOPY (EGD);  Surgeon: Rogene Houston, MD;  [facility];  Service: Endoscopy;  Laterality: N/A;  155  . KNEE ARTHROSCOPY Right 2005  . LAPAROSCOPIC ASSISTED VAGINAL HYSTERECTOMY  10-13-2010   w/ Bx Left Fallopian tube and Aspiration Right Ovarian Cyst  . LAPAROSCOPIC CHOLECYSTECTOMY  11-17-2002  . LAPAROSCOPIC  SALPINGO OOPHERECTOMY Bilateral 08/26/2015   Procedure: LAPAROSCOPIC SALPINGO OOPHORECTOMY, bilateral;  Surgeon: Everlene Farrier, MD;  Location: [facility];  Service: Gynecology;  Laterality: Bilateral;  . PORTACATH PLACEMENT  05-17-2014  . REDUCTION MAMMAPLASTY    . SHOULDER ARTHROSCOPY WITH ROTATOR CUFF REPAIR Right 2002  . TRANSTHORACIC ECHOCARDIOGRAM  05-18-2014   ef 60-65%//   last MUGA  (08-08-2015)  ef 56.6%       SOCIAL HISTORY:  Social History   Socioeconomic History  . Marital status: Married    Spouse name: Not on file  . Number of children: Not on file  . Years of education: Not on file  . Highest education level: Not on file  Social Needs  . Financial resource strain: Not on file  . Food insecurity - worry: Not on file  . Food insecurity - inability: Not on file  . Transportation needs - medical: Not on file  . Transportation needs - non-medical: Not on file  Occupational History  . Not on file  Tobacco Use  . Smoking status: Former Smoker    Types: Cigarettes    Last attempt to quit: 08/20/1994    Years since quitting: 22.8  . Smokeless tobacco: Never Used  Substance and Sexual Activity  . Alcohol use: Yes    Comment: Occasionally  .  Drug use: No  . Sexual activity: Yes    Partners: Male    Birth control/protection: Surgical, Condom  Other Topics Concern  . Not on file  Social History Narrative  . Not on file    FAMILY HISTORY:  Family History  Problem Relation Age of Onset  . Diabetes Father   . Heart attack Maternal Grandmother 30       multiple over lifetime.  . Cancer Maternal Grandmother 77       NOS  . Prostate cancer Maternal Grandfather        dx in his 97s  . Lung cancer Paternal Grandfather        dx <50  . Lymphoma Maternal Aunt        dx in her 24s  . Melanoma Cousin 72       maternal first cousin  . Brain cancer Cousin        paternal first cousin dx under 62  . Prostate cancer Other        MGF's father  . Colon  cancer Other        MGM's mother    CURRENT MEDICATIONS:  Outpatient Encounter Medications as of 06/23/2017  Medication Sig  . acetaminophen (TYLENOL) 325 MG tablet Take 650 mg by mouth every 6 (six) hours as needed for moderate pain or headache.  . calcium carbonate (TUMS - DOSED IN MG ELEMENTAL CALCIUM) 500 MG chewable tablet Chew 1 tablet by mouth daily.  . Calcium-Phosphorus-Vitamin D (CALCIUM/D3 ADULT GUMMIES PO) Take 2 tablets by mouth 2 (two) times daily. dosage of calcium 1276m and vitamin d 10043m . desvenlafaxine (PRISTIQ) 100 MG 24 hr tablet Take 1 tablet (100 mg total) by mouth daily.  . Difluprednate (DUREZOL) 0.05 % EMUL Apply to eye 2 (two) times daily.  . diphenhydrAMINE (BENADRYL) 25 mg capsule Take 25 mg by mouth every 6 (six) hours as needed for itching.  . furosemide (LASIX) 20 MG tablet Take 20 mg by mouth 2 (two) times daily.  . Marland Kitchenabapentin (NEURONTIN) 300 MG capsule Take 2 capsules (600 mg total) by mouth at bedtime.  . Marland Kitchenetorolac (ACULAR) 0.4 % SOLN INSTILL ONE DROP IN THE RIGHT EYE FOUR TIMES DAILY  . lapatinib (TYKERB) 250 MG tablet Take 5 tablets (1,250 mg total) by mouth daily.  . Marland Kitchenidocaine-prilocaine (EMLA) cream Apply 1 application topically daily as needed (apply to port before chemo).  . LORazepam (ATIVAN) 0.5 MG tablet Take 1 tablet (0.5 mg total) by mouth every 6 (six) hours as needed for anxiety.  . magic mouthwash SOLN Swish and swallow 63m22mour times a day.  . Melatonin 10 MG CAPS Take 1 tablet by mouth. Pt states she takes one tablet at night for sleep  . methylphenidate (RITALIN) 20 MG tablet Take 1 tablet (20 mg total) by mouth 2 (two) times daily with breakfast and lunch.  . morphine (MS CONTIN) 30 MG 12 hr tablet Take 1 tablet (30 mg total) by mouth every 12 (twelve) hours.  . NMarland KitchenRCAN 4 MG/0.1ML LIQD nasal spray kit CALL 911. ADMINISTER A SINGLE SPRAY OF NARCAN IN ONE NOSTRIL, REPEAT EVERY 3 MINUTES AS NEEDED IF NO OR MINIMAL RESPONSE  . ondansetron  (ZOFRAN) 8 MG tablet Take 1 tablet (8 mg total) by mouth every 8 (eight) hours as needed for nausea or vomiting.  . oMarland KitchenyCODONE (OXY IR/ROXICODONE) 5 MG immediate release tablet Take 1-2 tablets (5-10 mg total) by mouth every 4 (four) hours as needed for moderate pain.  .Marland Kitchen  pantoprazole (PROTONIX) 40 MG tablet Take 1 tablet (40 mg total) by mouth every 12 (twelve) hours.  . potassium chloride SA (K-DUR,KLOR-CON) 20 MEQ tablet Take 1 tablet (20 mEq total) by mouth 3 (three) times daily. (Patient not taking: Reported on 06/23/2017)  . promethazine (PHENERGAN) 25 MG tablet TAKE 1 TABLET BY MOUTH EVERY SIX HOURS AS NEEDED FOR NAUSEA OR VOMITING  . rivaroxaban (XARELTO) 20 MG TABS tablet TAKE 1 TABLET BY MOUTH EVERY DAY WITH SUPPER  . spironolactone (ALDACTONE) 25 MG tablet Take 1 tablet (25 mg total) by mouth daily.  . XELODA 500 MG tablet Take 1500 mg PO BID, 7 days on and 7 days off  . Zoledronic Acid (ZOMETA) 4 MG/100ML IVPB Inject 100 mLs (4 mg total) into the vein every 30 (thirty) days.  Marland Kitchen zolpidem (AMBIEN) 10 MG tablet TAKE ONE TABLET BY MOUTH AT BEDTIME AS NEEDED  . [DISCONTINUED] LORazepam (ATIVAN) 0.5 MG tablet Take 1 tablet (0.5 mg total) by mouth every 6 (six) hours as needed for anxiety.  . [DISCONTINUED] morphine (MS CONTIN) 30 MG 12 hr tablet Take 1 tablet (30 mg total) by mouth every 12 (twelve) hours.  . [EXPIRED] heparin lock flush 100 unit/mL   . [EXPIRED] Zoledronic Acid (ZOMETA) IVPB 4 mg   . [DISCONTINUED] 0.9 %  sodium chloride infusion    No facility-administered encounter medications on file as of 06/23/2017.     ALLERGIES:  No Known Allergies   PHYSICAL EXAM:  ECOG Performance status: 1 - Symptomatic, but independent.   BP 125/70 HR 77 Resp 20 Temp 97.8 O2 sat 97%  Weight 262.8 lbs      Physical Exam  Constitutional: She is oriented to person, place, and time and well-developed, well-nourished, and in no distress.  Seen in chemo chair in infusion area   HENT:    Head: Normocephalic.  Mouth/Throat: Oropharynx is clear and moist.  Eyes: Conjunctivae are normal. No scleral icterus.  Neck: Normal range of motion. Neck supple.  Cardiovascular: Normal rate and regular rhythm.  Pulmonary/Chest: Effort normal and breath sounds normal. No respiratory distress.  Abdominal: Soft. Bowel sounds are normal. There is no tenderness.  Musculoskeletal: Normal range of motion. She exhibits no edema.  Lymphadenopathy:    She has no cervical adenopathy.       Right: No supraclavicular adenopathy present.       Left: No supraclavicular adenopathy present.  Neurological: She is alert and oriented to person, place, and time. No cranial nerve deficit.  Skin: Skin is warm and dry. No rash noted.  Psychiatric: Mood, memory, affect and judgment normal.  Nursing note and vitals reviewed.    LABORATORY DATA:  I have reviewed the labs as listed.  CBC    Component Value Date/Time   WBC 5.0 06/23/2017 1326   RBC 4.02 06/23/2017 1326   HGB 10.9 (L) 06/23/2017 1326   HCT 35.6 (L) 06/23/2017 1326   PLT 251 06/23/2017 1326   MCV 88.6 06/23/2017 1326   MCH 27.1 06/23/2017 1326   MCHC 30.6 06/23/2017 1326   RDW 18.0 (H) 06/23/2017 1326   LYMPHSABS 1.1 06/23/2017 1326   MONOABS 0.4 06/23/2017 1326   EOSABS 0.0 06/23/2017 1326   BASOSABS 0.0 06/23/2017 1326   CMP Latest Ref Rng & Units 06/23/2017 05/26/2017 04/28/2017  Glucose 65 - 99 mg/dL 103(H) 144(H) 128(H)  BUN 6 - 20 mg/dL _0 Creatinine 0.44 - 1.00 mg/dL 0.83 0.97 0.86  Sodium 135 - 145 mmol/L 136  136 138  Potassium 3.5 - 5.1 mmol/L 3.7 3.7 3.7  Chloride 101 - 111 mmol/L 99(L) 97(L) 100(L)  CO2 22 - 32 mmol/L _0 Calcium 8.9 - 10.3 mg/dL 9.2 9.6 9.3  Total Protein 6.5 - 8.1 g/dL 7.2 7.8 7.2  Total Bilirubin 0.3 - 1.2 mg/dL 0.2(L) 0.7 0.3  Alkaline Phos 38 - 126 U/L 91 96 76  AST 15 - 41 U/L _1 ALT 14 - 54 U/L _2 PENDING LABS:     DIAGNOSTIC IMAGING:  Most recent  PET scan: 03/24/17 CLINICAL DATA:  Subsequent treatment strategy for metastatic breast cancer.  EXAM: NUCLEAR MEDICINE PET SKULL BASE TO THIGH  TECHNIQUE: 13.0 mCi F-18 FDG was injected intravenously. Full-ring PET imaging was performed from the skull base to thigh after the radiotracer. CT data was obtained and used for attenuation correction and anatomic localization.  FASTING BLOOD GLUCOSE:  Value: 110 mg/dl  COMPARISON:  01/20/2017  FINDINGS: NECK: No hypermetabolic lymph nodes in the neck.  CHEST: No hypermetabolic mediastinal or hilar nodes. No suspicious pulmonary nodules on the CT scan.  ABDOMEN/PELVIS: No abnormal hypermetabolic activity within the liver, pancreas, adrenal glands, or spleen. No hypermetabolic lymph nodes in the abdomen or pelvis.  SKELETON: Multifocal hypermetabolic skeletal metastases are again noted. The index lesion within the right glenoid has an SUV max equal to 15.9. Previously 12.3. The T7 lesion within the T7 vertebra has an SUV max equal to 7.95. Previously 5.7. Hypermetabolic lesion involving the T10 vertebra and extending into the left posterior elements has an SUV max of 11.82. Previously 8.3. Index lesion within the left iliac crest has an SUV max equal to 15.27. Previously 16.86. Hypermetabolic lesion within the right hip has an SUV max of 17.06. Previously 16.23.  IMPRESSION: 1. No evidence for hypermetabolic soft tissue metastases. 2. No significant change and hypermetabolic bone metastasis with some mild variability in degree of FDG uptake.   Electronically Signed   By: Kerby Moors M.D.   On: 03/24/2017 15:49     Most recent ECHO: 04/27/17  *[address] [place], [place] [address]                            [phone]  ------------------------------------------------------------------- Transthoracic  Echocardiography  Patient:    Allison, [name] MR #:       [id number] Study Date: 04/27/2017 Gender:     F Age:        52 Height:     172.7 cm Weight:     117.5 kg BSA:        2.43 m^2 Pt. Status: Room:   SONOGRAPHER  [facility]  PERFORMING   [facility], Forest[facility] Na  ATTENDING    Laurelyn Sickle, Elzie Rings W  REFERRING    Mike Craze W  cc:  ------------------------------------------------------------------- LV EF: 60% -   65%  ------------------------------------------------------------------- History:   PMH:  Metastatic Breast Cancer, Drug Induced Cardiomyopathy, Bone and Brain Metastases, Former Smoker.  ------------------------------------------------------------------- Study Conclusions  - Left ventricle: The cavity  size was normal. Wall thickness was   increased in a pattern of mild LVH. Systolic function was normal.   The estimated ejection fraction was in the range of 60% to 65%.   Normal global longitudinal strain of -22.5%. Wall motion was   normal; there were no regional wall motion abnormalities. Left   ventricular diastolic function parameters were normal. - Aortic valve: Mildly calcified annulus. Trileaflet. - Right atrium: Central venous pressure (est): 3 mm Hg. - Atrial septum: No defect or patent foramen ovale was identified. - Tricuspid valve: There was trivial regurgitation. - Pulmonary arteries: Systolic pressure could not be accurately   estimated. - Pericardium, extracardiac: There was no pericardial effusion.  Impressions:  - Mild LVH with LVEF 60-65% and normal diastolic function. Normal   global longitudinal strain of -22.5%. Mildly calcified aortic   annulus. Trivial tricuspid regurgitation.     MRI brain: 06/02/17 CLINICAL DATA:  Headache for 4 weeks. History of breast cancer with brain metastases.  EXAM: MRI HEAD WITHOUT AND WITH CONTRAST  TECHNIQUE: Multiplanar, multiecho pulse sequences of  the brain and surrounding structures were obtained without and with intravenous contrast.  CONTRAST:  73m MULTIHANCE GADOBENATE DIMEGLUMINE 529 MG/ML IV SOLN  COMPARISON:  11/02/2016  FINDINGS: Brain: A new focus of chronic microhemorrhage is present in the parasagittal left frontoparietal region, and a metastasis was present in this location in 09/2014. A punctate focus of susceptibility artifact more anteriorly in the left centrum semiovale may represent an additional chronic microhemorrhage or be vascular. Mild T2 hyperintensity in the periventricular white matter bilaterally is unchanged and may reflect sequelae of prior radiation therapy and/or chronic small vessel ischemia. A 4 mm nonenhancing T2 hyperintense focus in the right caudate head and a similar 3 mm focus inferiorly in the left cerebellum are unchanged and correspond to previously treated metastases. No enhancing brain lesions are identified.  There is no evidence of acute infarct, midline shift, or extra-axial fluid collection. Cerebral volume is unchanged and within normal limits for age. A mildly expanded partially empty sella is unchanged.  Vascular: Major intracranial vascular flow voids are preserved.  Skull and upper cervical spine: No suspicious marrow lesion.  Sinuses/Orbits: Bilateral cataract extraction. Paranasal sinuses and mastoid air cells are clear.  Other: None.  IMPRESSION: No evidence of recurrent brain metastases or acute intracranial abnormality.   Electronically Signed   By: ALogan BoresM.D.   On: 06/02/2017 14:44     PATHOLOGY:  [facility]: 05/25/14            ASSESSMENT & PLAN:   Stage IV metastatic breast adenocarcinoma with liver, bone, brain, and spine mets; ER+/HER2+: -Currently taking Xeloda 1500 mg BID 7 days on and 7 days off with Tykerb daily.  -Tumor markers (CA 27-29 & CA 15-3) continue to rise. Her last restaging PET scan in 03/2017  showed overall stable disease without evidence of any visceral/soft tissue mets; bony lesions are largely unchanged/stable with varying degrees to hypermetabolism.   -At last visit, she decided to hold her Xeloda beginning on 06/04/17 for a short "drug holiday."  During this time, she has also been to [facility] to see Dr. MJuanita Craverfor 2nd opinion (his formal recommendations are pending).  Clinically, she feels much better off of the Xeloda.  Remains on Tykerb, which she tolerates well.   -Will continue to hold her Xeloda for now (given previous discussions with MD) and await formal recommendations from Dr. MJuanita Craverand with Dr. KDelton Coombes our new medical oncologist joining  our team in March.  Of course, I will defer any future treatment changes to Dr. Delton Coombes as he deems clinical appropriate.  I will make arrangements for Ezrie to return to cancer center in mid-March to see Dr. Raliegh Ip and discuss her treatment plan.  She agrees with this plan.  -Will defer restaging imaging and future ECHO orders to Dr. Raliegh Ip as well, which can be decided at her next follow-up visit.    Headaches: -History of brain mets. Headaches are improving.  -MRI brain from 06/02/17 reviewed with patient. No evidence of new or progressive brain mets.    Fatigue/Sleep disturbance:  -Sleep study completed on 02/23/17 (ordered by psychiatry), showed severe sleep apnea.  She has been using CPAP machine consistently.  -Continue Ambien at bedtime as needed.   Hypokalemia/Chronic LE edema:  -Hypokalemia resolved. Continue Aldactone for diuresis.   Bone mets:  -Continue monthly Zometa for now; the frequency of this therapy may be lengthened based on Dr. Tomie China clinical judgment and recommendations.   Cancer-related fatigue & Depression/Anxiety:  -Continue current medication regimen per psychiatry with Pristiq daily and Ativan PRN. Ativan refilled today per patient request after reviewing [place] Controlled Substance Reporting System.  -Continue  follow-up with psychiatry as directed. -Continue Ritalin 20 mg BID, although she historically only takes once per day.  She generally does not take the medication BID, but it is helpful with first dose in the mornings.    In the future, I would be reluctant to further increase the dose given tachycardia and possibility of cardiotoxicity with Tykerb. -Dr. Daron Offer with psychiatry recently refilled Ritalin & Pristiq for patient. I am happy to have Dr. Joycelyn Schmid team assume responsibility of managing her fatigue and depression medications.   Chronic pain secondary to neoplasm:  -MS Contin effective in managing her pain.  Maintain current dose of MS Contin for basal pain control with oxycodone PRN. Bowels reportedly moving well.  Refill for MS Contin e-scribed to her pharmacy today after reviewing [place] Controlled Substance Reporting System.          Dispo:  -Continue to hold Xeloda for now (awaiting recommendations from consultation at [facility]) -Return to cancer center in 1 month for follow-up with Dr. Delton Coombes.     All questions were answered to patient's stated satisfaction. Encouraged patient to call with any new concerns or questions before her next visit to the cancer center and we can certain see her sooner, if needed.       Orders placed this encounter:  Orders Placed This Encounter  Procedures  . Cancer antigen 15-3  . Cancer antigen 27.29  . CBC with Differential/Platelet  . Comprehensive metabolic panel       Mike Craze, NP [place] [phone]

## 2017-06-29 LAB — CANCER ANTIGEN 15-3: CA 15-3: 89.1 U/mL — ABNORMAL HIGH (ref 0.0–25.0)

## 2017-07-02 ENCOUNTER — Telehealth (HOSPITAL_COMMUNITY): Payer: Self-pay

## 2017-07-02 NOTE — Telephone Encounter (Signed)
-----   Message from Holley Bouche, NP sent at 07/02/2017  9:40 AM EST ----- I e-scribed her morphine to [facility] on 2/13. The refill should already be there.   gwd ----- Message ----- From: Epifanio Lesches Sent: 07/01/2017  12:51 PM To: Holley Bouche, NP  Refill on morphine

## 2017-07-02 NOTE — Telephone Encounter (Signed)
Left patient message that the refill was sent to [facility] on 06/23/17 with a do not fill until 07/05/17 date on it. Any further questions,please call caner center.

## 2017-07-04 ENCOUNTER — Other Ambulatory Visit: Payer: Self-pay | Admitting: Hematology

## 2017-07-04 ENCOUNTER — Other Ambulatory Visit (HOSPITAL_COMMUNITY): Payer: Self-pay | Admitting: Adult Health

## 2017-07-04 DIAGNOSIS — G4700 Insomnia, unspecified: Secondary | ICD-10-CM

## 2017-07-04 DIAGNOSIS — K219 Gastro-esophageal reflux disease without esophagitis: Secondary | ICD-10-CM

## 2017-07-10 ENCOUNTER — Other Ambulatory Visit (HOSPITAL_COMMUNITY): Payer: Self-pay | Admitting: Adult Health

## 2017-07-10 DIAGNOSIS — C50919 Malignant neoplasm of unspecified site of unspecified female breast: Secondary | ICD-10-CM

## 2017-07-10 NOTE — Telephone Encounter (Signed)
Please print Rx for signature. If refill is needed before I return to the office on Thursday, then please have covering MD sign prescription.   Mike Craze, NP [place] [phone]

## 2017-07-12 ENCOUNTER — Other Ambulatory Visit (HOSPITAL_COMMUNITY): Payer: Self-pay | Admitting: *Deleted

## 2017-07-12 DIAGNOSIS — C50919 Malignant neoplasm of unspecified site of unspecified female breast: Secondary | ICD-10-CM

## 2017-07-12 MED ORDER — LAPATINIB DITOSYLATE 250 MG PO TABS: 1250.0000 mg | ORAL_TABLET | Freq: Every day | ORAL | 1 refills | Status: DC

## 2017-07-21 ENCOUNTER — Inpatient Hospital Stay (HOSPITAL_COMMUNITY): Payer: Medicare Other

## 2017-07-21 ENCOUNTER — Inpatient Hospital Stay (HOSPITAL_COMMUNITY): Payer: Medicare Other | Attending: Adult Health | Admitting: Hematology

## 2017-07-21 ENCOUNTER — Encounter (HOSPITAL_COMMUNITY): Payer: Self-pay | Admitting: Hematology

## 2017-07-21 VITALS — BP 127/74 | HR 97 | Temp 97.7°F | Resp 16 | Wt 260.2 lb

## 2017-07-21 DIAGNOSIS — C50919 Malignant neoplasm of unspecified site of unspecified female breast: Secondary | ICD-10-CM

## 2017-07-21 DIAGNOSIS — M542 Cervicalgia: Secondary | ICD-10-CM | POA: Insufficient documentation

## 2017-07-21 DIAGNOSIS — Z923 Personal history of irradiation: Secondary | ICD-10-CM | POA: Insufficient documentation

## 2017-07-21 DIAGNOSIS — C50911 Malignant neoplasm of unspecified site of right female breast: Secondary | ICD-10-CM

## 2017-07-21 DIAGNOSIS — C787 Secondary malignant neoplasm of liver and intrahepatic bile duct: Secondary | ICD-10-CM | POA: Insufficient documentation

## 2017-07-21 DIAGNOSIS — C7951 Secondary malignant neoplasm of bone: Secondary | ICD-10-CM | POA: Insufficient documentation

## 2017-07-21 DIAGNOSIS — Z9221 Personal history of antineoplastic chemotherapy: Secondary | ICD-10-CM | POA: Insufficient documentation

## 2017-07-21 DIAGNOSIS — Z5112 Encounter for antineoplastic immunotherapy: Secondary | ICD-10-CM | POA: Insufficient documentation

## 2017-07-21 DIAGNOSIS — Z79899 Other long term (current) drug therapy: Secondary | ICD-10-CM | POA: Diagnosis not present

## 2017-07-21 NOTE — Progress Notes (Signed)
Patient Care Team: [name], MD as PCP - General  DIAGNOSIS: HER-2 positive metastatic breast cancer to the bones.  SUMMARY OF ONCOLOGIC HISTORY:   Breast cancer, stage 4 ([place])   04/25/2014 Initial Diagnosis    Breast cancer, stage 4      04/25/2014 Imaging    CT abdomen/pelvis with widespread metastatic disease to the liver, multiple lytic lesions throughout spine and pelvis. No FX or epidural tumor identified      04/26/2014 Imaging    CT head unremarkable      04/26/2014 Imaging    CT chest with no lung mass or pulmonary nodules, no adenopathy. Lytic bone lesions, right 2nd rib      04/27/2014 Initial Biopsy    U/S guided liver biopsy, lesion in anterior and inferior left hepatic lobe biopsied      04/27/2014 Pathology Results    Metastatic adenocarcinoma, CK7, ER+, patchy positivity with PR. Possible primary includes breast, less likely gynecologic      05/15/2014 Mammogram    BI-RADS CATEGORY  2: Benign Finding(s)      05/16/2014 PET scan    1. Intensely hypermetabolic hepatic metastasis. 2. Widespread hypermetabolic skeletal lesions. 3. No primary adenocarcinoma identified by FDG PET imaging.      05/19/2014 Imaging    MUGA- Left ventricular ejection fracture greater than 70%.      05/21/2014 Breast MRI    No suspicious masses or enhancement within the breasts. No axillary adenopathy.      05/22/2014 - 07/03/2014 Antibody Plan    Herceptin/Perjeta/Tamoxifen      06/12/2014 - 07/03/2014 Chemotherapy    Taxotere added secondary to persistent abdominal and back pain      06/17/2014 - 06/19/2014 Hospital Admission    Neutropenia, fever, diarrhea, nausea, vomiting      06/20/2014 - 07/10/2014 Radiation Therapy    Dr. Thea Silversmith 12 fractions to L3-S3 (30 Gy) and left scapula (20 Gy).       07/03/2014 Adverse Reaction    Perjeta- induced diarrhea.  Perjeta discontinued      07/16/2014 - 07/20/2014 Hospital Admission    Electrolyte abnormalities, and  diarrhea.  Suspect Perjeta-induced diarrhea.  Negative GI work-up.      07/24/2014 - 08/19/2015 Chemotherapy    Herceptin/Tamoxifen/Xgeva      08/21/2014 Imaging    MUGA- Left ventricular ejection fraction equals 71%.      08/24/2014 PET scan    Dramatic reduction in metabolic activity of the widespread liver metastasis. Liver metastasis now have metabolic activity equal to background normal liver activity. Liver has a nodular contour. Marked reduction in metabolic activity of skeletal lesions..      10/05/2014 Progression    Widespread metastatic disease to the brain as described. Between 20 and 30 intracranial metastatic deposits are now seen. No midline shift or incipient herniation      10/09/2014 - 10/26/2014 Radiation Therapy    Whole Brain XRT      11/14/2014 Imaging    MUGA- LVEF 67%      02/13/2015 Imaging    MUGA- LVEF 59%      02/15/2015 Treatment Plan Change    Due to declining LVEF, will hold Herceptin per PI guidelines.      04/12/2015 -  Chemotherapy    Herceptin restarted      06/02/2015 - 06/08/2015 Hospital Admission    Pneumonia      07/05/2015 Progression     PET/CT concern for mild progression of skeletal metastasis with  several lesions within the spine and 1 lesion in the Left iliac wing with mild to moderate metabolic activity new from prior. Rising CA 27-29      07/16/2015 - 10/23/2015 Anti-estrogen oral therapy    Arimidex      07/16/2015 Imaging    MRI brain with satisfactory post treatment apperance of brain. interval resolved enhancing R caudate metastasis, minimal punctate residual enhancing metastatic disease at the inferior L cerebellum. No new metastatic disease or new intracranial abnormality      07/19/2015 Treatment Plan Change    Discontinue Tamoxifen, Zoladex plus Arimidex.       08/27/2015 Procedure    Laparoscopic bilateral salpingo-oophorectomy by Dr. Gaetano Net      10/17/2015 PET scan    Osseous metastatic disease appears slightly  progressive based on a new right scapular lesion and increased uptake within lesions in the thoracic spine, left iliac wing and  proximal right femur.      10/17/2015 Progression    Slight progression on PET scan imaging.      10/18/2015 Imaging    REsolved enhancing metastatic disease to the brain status post WBXRT      10/23/2015 - 02/12/2016 Adjuvant Chemotherapy    Faslodex loading followed by maintenance dose.  (Herceptin continued)      11/04/2015 Imaging    MUGA- LEFT ventricular ejection fraction 51% slightly decreased in a 57% on the previous exam.      12/24/2015 Treatment Plan Change    Zometa every 28 days.  Xgeva discontinued.        01/01/2016 Imaging    MUGA- Left ventricular ejection fraction equals 57.9%. This is increased from 51.1% previously.      02/03/2016 PET scan    1. Mixed metabolic changes in the scattered hypermetabolic sclerotic osseous metastases throughout the axial and proximal appendicular skeleton as detailed above. 2. No new sites of hypermetabolic metastatic disease. Stable pseudo-cirrhotic appearance of the liver due to treated liver metastases with no hypermetabolic liver metastases.       02/03/2016 Progression    PET shows mixed osseous response.  Some lesions more hypermetabolic, others improved.      02/05/2016 Treatment Plan Change    D/C Herceptin.  Faslodex as scheduled on 02/12/2016, then discontinued.  Continue Zometa.      02/05/2016 Treatment Plan Change    Prescriptions for Xeloda 7 days on and 7 days off and Tykerb printed and provided for authorization.      02/19/2016 -  Chemotherapy    Xeloda 2300 mg BID 7 days on and 7 days off and Tykerb       02/24/2016 Treatment Plan Change    Xeloda dose reduced by 10% to 2000 mg BID week on and week off.      03/18/2016 Treatment Plan Change    Xeloda dose reduced to 2000 mg in AM and 1500 mg in PM 7 days on and 7 days off.      03/23/2016 Imaging    MUGA- Normal LEFT  ventricular ejection fraction of 56% not significantly changed from 58% on previous exam.      05/19/2016 Imaging    MRI brain- . No new focus of abnormal enhancement to suggest interval metastatic disease. 2. No acute intracranial abnormality. 3. Stable foci of T2 FLAIR hyperintensity in white matter and in the right caudate head compatible with posttreatment changes and treated metastasis.      05/20/2016 PET scan    1. The multiple osseous metastatic lesions  shown to be hypermetabolic on the prior exam are reduced in activity or even resolved in hypermetabolic activity compared to prior, as detailed above. There are also numerous sclerotic bony lesions wedge are not currently and were not previously hypermetabolic, compatible with old non active lesions. 2. No findings of extra osseous metastatic disease currently. 3. Hepatic pseudocirrhosis. 4. Healing rib fractures. Chronic pathologic fracture the left acromion.      07/08/2016 Imaging    MUGA- Low normal LEFT ventricular ejection fraction of 53%, little changed since the 56% on 03/23/2016 but slightly decreased from the 58% on 01/01/2016.      09/07/2016 Echocardiogram    MUGA- Normal LEFT ventricular ejection fraction of 59% with normal LV wall motion.      09/07/2016 Treatment Plan Change    Xeloda dose reduced to 1500 mg BID 7 days on and 7 days off.      09/15/2016 PET scan    No significant change in diffuse sclerotic bone metastases on CT images, although several show mildly increased FDG uptake since prior study.  No evidence of soft tissue metastatic disease.      11/02/2016 Imaging    MRI brain- No change from the prior study. Previously noted metastatic deposits have been treated. No new lesions identified.       CHIEF COMPLIANT: Follow-up of metastatic breast cancer.  INTERVAL HISTORY: [name] is a 52 year old pleasant female who is seen today for follow-up of metastatic breast cancer.  She is  tolerating lapatinib very well.  She denies any symptoms of PND or orthopnea.  No new bone pains were reported.  She developed left-sided neck pain since last night when she woke up with it.  She is planning to go to [place] to visit her son this weekend.  Her appetite has been good and her weight has been stable.     REVIEW OF SYSTEMS:   Constitutional: Denies fevers, chills or abnormal weight loss Eyes: Denies blurriness of vision Ears, nose, mouth, throat, and face: Denies mucositis or sore throat Respiratory: Denies cough, dyspnea or wheezes Cardiovascular: Denies palpitation, chest discomfort Gastrointestinal:  Denies nausea, heartburn or change in bowel habits Skin: Denies abnormal skin rashes Lymphatics: Denies new lymphadenopathy or easy bruising Neurological:Denies numbness, tingling or new weaknesses Behavioral/Psych: Mood is stable, no new changes  Extremities: No lower extremity edema Breast:  denies any pain or lumps or nodules in either breasts All other systems were reviewed with the patient and are negative.  I have reviewed the past medical history, past surgical history, social history and family history with the patient and they are unchanged from previous note.  ALLERGIES:  has No Known Allergies.  MEDICATIONS:  Current Outpatient Medications  Medication Sig Dispense Refill  . acetaminophen (TYLENOL) 325 MG tablet Take 650 mg by mouth every 6 (six) hours as needed for moderate pain or headache.    . calcium carbonate (TUMS - DOSED IN MG ELEMENTAL CALCIUM) 500 MG chewable tablet Chew 1 tablet by mouth daily.    . Calcium-Phosphorus-Vitamin D (CALCIUM/D3 ADULT GUMMIES PO) Take 2 tablets by mouth 2 (two) times daily. dosage of calcium '1200mg'$  and vitamin d '1000mg'$     . desvenlafaxine (PRISTIQ) 100 MG 24 hr tablet Take 1 tablet (100 mg total) by mouth daily. 90 tablet 1  . Difluprednate (DUREZOL) 0.05 % EMUL Apply to eye 2 (two) times daily.    . diphenhydrAMINE (BENADRYL)  25 mg capsule Take 25 mg by mouth every 6 (six) hours  as needed for itching.    . furosemide (LASIX) 20 MG tablet Take 20 mg by mouth 2 (two) times daily.  3  . gabapentin (NEURONTIN) 300 MG capsule Take 2 capsules (600 mg total) by mouth at bedtime. 60 capsule 5  . ketorolac (ACULAR) 0.4 % SOLN INSTILL ONE DROP IN THE RIGHT EYE FOUR TIMES DAILY  1  . lapatinib (TYKERB) 250 MG tablet Take 5 tablets (1,250 mg total) by mouth daily. 140 tablet 1  . lidocaine-prilocaine (EMLA) cream Apply 1 application topically daily as needed (apply to port before chemo). 30 g 2  . LORazepam (ATIVAN) 0.5 MG tablet Take 1 tablet (0.5 mg total) by mouth every 6 (six) hours as needed for anxiety. 30 tablet 2  . Melatonin 10 MG CAPS Take 1 tablet by mouth. Pt states she takes one tablet at night for sleep    . methylphenidate (RITALIN) 20 MG tablet Take 1 tablet (20 mg total) by mouth 2 (two) times daily with breakfast and lunch. 60 tablet 0  . morphine (MS CONTIN) 30 MG 12 hr tablet Take 1 tablet (30 mg total) by mouth every 12 (twelve) hours. 60 tablet 0  . NARCAN 4 MG/0.1ML LIQD nasal spray kit CALL 911. ADMINISTER A SINGLE SPRAY OF NARCAN IN ONE NOSTRIL, REPEAT EVERY 3 MINUTES AS NEEDED IF NO OR MINIMAL RESPONSE  99  . ondansetron (ZOFRAN) 8 MG tablet Take 1 tablet (8 mg total) by mouth every 8 (eight) hours as needed for nausea or vomiting. 60 tablet 4  . oxyCODONE (OXY IR/ROXICODONE) 5 MG immediate release tablet Take 1-2 tablets (5-10 mg total) by mouth every 4 (four) hours as needed for moderate pain. 60 tablet 0  . pantoprazole (PROTONIX) 40 MG tablet TAKE ONE TABLET BY MOUTH EVERY TWELVE HOURS 60 tablet 3  . promethazine (PHENERGAN) 25 MG tablet TAKE 1 TABLET BY MOUTH EVERY SIX HOURS AS NEEDED FOR NAUSEA OR VOMITING 30 tablet 6  . rivaroxaban (XARELTO) 20 MG TABS tablet TAKE 1 TABLET BY MOUTH EVERY DAY WITH SUPPER 30 tablet 3  . spironolactone (ALDACTONE) 25 MG tablet Take 1 tablet (25 mg total) by mouth daily.  30 tablet 3  . Zoledronic Acid (ZOMETA) 4 MG/100ML IVPB Inject 100 mLs (4 mg total) into the vein every 30 (thirty) days. 100 mL 11  . zolpidem (AMBIEN) 10 MG tablet TAKE ONE TABLET BY MOUTH AT BEDTIME AS NEEDED 30 tablet 1  . magic mouthwash SOLN Swish and swallow 65m four times a day. (Patient not taking: Reported on 07/21/2017) 480 mL 3  . potassium chloride SA (K-DUR,KLOR-CON) 20 MEQ tablet Take 1 tablet (20 mEq total) by mouth 3 (three) times daily. (Patient not taking: Reported on 06/23/2017) 90 tablet 3  . XELODA 500 MG tablet Take 1500 mg PO BID, 7 days on and 7 days off (Patient not taking: Reported on 07/21/2017) 84 tablet 1   No current facility-administered medications for this visit.     PHYSICAL EXAMINATION: ECOG PERFORMANCE STATUS: 1 - Symptomatic but completely ambulatory  Vitals:   07/21/17 1152  BP: 127/74  Pulse: 97  Resp: 16  Temp: 97.7 F (36.5 C)  SpO2: 96%   Filed Weights   07/21/17 1152  Weight: 260 lb 3.2 oz (118 kg)    GENERAL:alert, no distress and comfortable SKIN: skin color, texture, turgor are normal, no rashes or significant lesions EYES: normal, Conjunctiva are pink and non-injected, sclera clear OROPHARYNX:no mucositis, no erythema and lips, buccal mucosa, and tongue  normal  NECK: supple, thyroid normal size, non-tender, without nodularity LYMPH:  no palpable lymphadenopathy in the cervical, axillary or inguinal LUNGS: clear to auscultation and percussion with normal breathing effort HEART: regular rate & rhythm and no murmurs and no lower extremity edema ABDOMEN:abdomen soft, non-tender and normal bowel sounds EXTREMITIES: No lower extremity edema BREAST: Examination not done today.  LABORATORY DATA:  I have reviewed the data as listed CMP Latest Ref Rng & Units 06/23/2017 05/26/2017 04/28/2017  Glucose 65 - 99 mg/dL 103(H) 144(H) 128(H)  BUN 6 - 20 mg/dL '10 12 13  '$ Creatinine 0.44 - 1.00 mg/dL 0.83 0.97 0.86  Sodium 135 - 145 mmol/L 136 136  138  Potassium 3.5 - 5.1 mmol/L 3.7 3.7 3.7  Chloride 101 - 111 mmol/L 99(L) 97(L) 100(L)  CO2 22 - 32 mmol/L '27 28 26  '$ Calcium 8.9 - 10.3 mg/dL 9.2 9.6 9.3  Total Protein 6.5 - 8.1 g/dL 7.2 7.8 7.2  Total Bilirubin 0.3 - 1.2 mg/dL 0.2(L) 0.7 0.3  Alkaline Phos 38 - 126 U/L 91 96 76  AST 15 - 41 U/L '20 26 24  '$ ALT 14 - 54 U/L '17 17 17   '$ Lab Results  Component Value Date   CAN153 89.1 (H) 06/23/2017   CAN153 91.5 (H) 05/26/2017   CAN153 73.4 (H) 04/28/2017     Lab Results  Component Value Date   WBC 5.0 06/23/2017   HGB 10.9 (L) 06/23/2017   HCT 35.6 (L) 06/23/2017   MCV 88.6 06/23/2017   PLT 251 06/23/2017   NEUTROABS 3.5 06/23/2017    ASSESSMENT & PLAN:  1. Metastatic HER-2 positive breast cancer to the bones and brain: She has been on Xeloda and Tykerb for over a year.  Xeloda was discontinued secondary to poor tolerance.  She was on a 1 week of 1 week on schedule.  She felt terrible during that time.  She saw Dr. Juanita Craver at [facility] for second opinion.  She has discontinued Xeloda after seeing him.  Her last brain MRI on 06/02/2017 did not show any evidence of recurrence of brain metastasis.  Last PET/CT scan on 03/24/2017 showed no evidence of hypermetabolic soft tissue metastasis with no significant change in the hypermetabolic bone metastasis.  Her index lesion is in the right glenoid.  I agree with Dr.Marcom's recommendation of discontinuing Xeloda, as her disease has been stable this long.  We will and Herceptin to the regimen, with a loading dose of 8 mg/kg today.  She will continue lapatinib 1000 milligrams daily.  Her last 2D echocardiogram in February showed EF of 65%.  Continue to monitor closely.  Her last tumor markers last month have been stable in the 80-90 range.  2.  Bone metastasis: She has received zoledronic acid once a month for 2-3 years.  Hence will switch to every 55-monthschedule at this time given the risk for osteonecrosis of the  jaw.  I spent 25 minutes talking to the patient of which more than half was spent in counseling and coordination of care.    SDerek Jack MD 07/21/17

## 2017-07-21 NOTE — Patient Instructions (Signed)
[facility] at [facility] Discharge Instructions  You saw Dr. K today.   Thank you for choosing [facility] at [facility] to provide your oncology and hematology care.  To afford each patient quality time with our provider, please arrive at least 15 minutes before your scheduled appointment time.   If you have a lab appointment with the Cancer Center please come in thru the  Main Entrance and check in at the main information desk  You need to re-schedule your appointment should you arrive 10 or more minutes late.  We strive to give you quality time with our providers, and arriving late affects you and other patients whose appointments are after yours.  Also, if you no show three or more times for appointments you may be dismissed from the clinic at the providers discretion.     Again, thank you for choosing [facility].  Our hope is that these requests will decrease the amount of time that you wait before being seen by our physicians.       _____________________________________________________________  Should you have questions after your visit to [facility], please contact our office at [phone] between the hours of 8:30 a.m. and 4:30 p.m.  Voicemails left after 4:30 p.m. will not be returned until the following business day.  For prescription refill requests, have your pharmacy contact our office.       Resources For Cancer Patients and their Caregivers ? American Cancer Society: Can assist with transportation, wigs, general needs, runs Look Good Feel Better.        1-888-227-6333 ? Cancer Care: Provides financial assistance, online support groups, medication/co-pay assistance.  1-800-813-HOPE (4673) ? [facility] Assists [place] cancer patients and their families through emotional , educational and financial support.  336-427-4357 ? [place] DSS Where to apply for food  stamps, Medicaid and utility assistance. 336-342-1394 ? [facility]: Transportation to medical appointments. 336-347-2287 ? Social Security Administration: May apply for disability if have a Stage IV cancer. 336-342-7796 1-800-772-1213 ? [place] Aging, Disability and Transit Services: Assists with nutrition, care and transit needs. 336-349-2343  Cancer Center Support Programs:   > Cancer Support Group  2nd Tuesday of the month 1pm-2pm, Journey Room   > Creative Journey  3rd Tuesday of the month 1130am-1pm, Journey Room     

## 2017-07-21 NOTE — Progress Notes (Signed)
Patient was seen today by Dr. Delton Coombes. No labs needed today. Will start Herceptin tomorrow.

## 2017-07-22 ENCOUNTER — Inpatient Hospital Stay (HOSPITAL_COMMUNITY): Payer: Medicare Other

## 2017-07-22 ENCOUNTER — Encounter (HOSPITAL_COMMUNITY): Payer: Self-pay

## 2017-07-22 VITALS — BP 127/71 | HR 65 | Temp 98.2°F | Resp 18 | Wt 262.0 lb

## 2017-07-22 DIAGNOSIS — C7951 Secondary malignant neoplasm of bone: Secondary | ICD-10-CM | POA: Diagnosis not present

## 2017-07-22 DIAGNOSIS — Z5112 Encounter for antineoplastic immunotherapy: Secondary | ICD-10-CM | POA: Diagnosis not present

## 2017-07-22 DIAGNOSIS — Z79899 Other long term (current) drug therapy: Secondary | ICD-10-CM | POA: Diagnosis not present

## 2017-07-22 DIAGNOSIS — C787 Secondary malignant neoplasm of liver and intrahepatic bile duct: Secondary | ICD-10-CM | POA: Diagnosis not present

## 2017-07-22 DIAGNOSIS — C50919 Malignant neoplasm of unspecified site of unspecified female breast: Secondary | ICD-10-CM

## 2017-07-22 DIAGNOSIS — M542 Cervicalgia: Secondary | ICD-10-CM | POA: Diagnosis not present

## 2017-07-22 DIAGNOSIS — C50911 Malignant neoplasm of unspecified site of right female breast: Secondary | ICD-10-CM | POA: Diagnosis not present

## 2017-07-22 MED ORDER — SODIUM CHLORIDE 0.9% FLUSH
10.0000 mL | INTRAVENOUS | Status: DC | PRN
  Administered 2017-07-22: 10 mL
  Filled 2017-07-22: qty 10

## 2017-07-22 MED ORDER — DIPHENHYDRAMINE HCL 25 MG PO CAPS
50.0000 mg | ORAL_CAPSULE | Freq: Once | ORAL | Status: AC
  Administered 2017-07-22: 50 mg via ORAL

## 2017-07-22 MED ORDER — HEPARIN SOD (PORK) LOCK FLUSH 100 UNIT/ML IV SOLN
500.0000 [IU] | Freq: Once | INTRAVENOUS | Status: AC | PRN
  Administered 2017-07-22: 500 [IU]

## 2017-07-22 MED ORDER — ACETAMINOPHEN 325 MG PO TABS
650.0000 mg | ORAL_TABLET | Freq: Once | ORAL | Status: AC
  Administered 2017-07-22: 650 mg via ORAL

## 2017-07-22 MED ORDER — SODIUM CHLORIDE 0.9 % IV SOLN
Freq: Once | INTRAVENOUS | Status: AC
  Administered 2017-07-22: 11:00:00 via INTRAVENOUS

## 2017-07-22 MED ORDER — DIPHENHYDRAMINE HCL 25 MG PO CAPS
ORAL_CAPSULE | ORAL | Status: AC
  Filled 2017-07-22: qty 2

## 2017-07-22 MED ORDER — ACETAMINOPHEN 325 MG PO TABS
ORAL_TABLET | ORAL | Status: AC
  Filled 2017-07-22: qty 2

## 2017-07-22 MED ORDER — TRASTUZUMAB CHEMO 150 MG IV SOLR
900.0000 mg | Freq: Once | INTRAVENOUS | Status: AC
  Administered 2017-07-22: 900 mg via INTRAVENOUS
  Filled 2017-07-22: qty 42.86

## 2017-07-22 MED ORDER — HEPARIN SOD (PORK) LOCK FLUSH 100 UNIT/ML IV SOLN
INTRAVENOUS | Status: AC
  Filled 2017-07-22: qty 5

## 2017-07-22 NOTE — Patient Instructions (Addendum)
[facility] Discharge Instructions for Patients Receiving Chemotherapy   Beginning January 23rd 2017 lab work for the [facility] will be done in the  Main lab at [facility] on 1st floor. If you have a lab appointment with the [facility] please come in thru the  Main Entrance and check in at the main information desk   Today you received the following chemotherapy agents Herceptin.Start Tykerb 4 tabs daily (1000 mg total). Follow-up as scheduled. Call clinic for any questions or concerns  To help prevent nausea and vomiting after your treatment, we encourage you to take your nausea medication   If you develop nausea and vomiting, or diarrhea that is not controlled by your medication, call the clinic.  The clinic phone number is [phone]. Office hours are Monday-Friday 8:30am-5:00pm.  BELOW ARE SYMPTOMS THAT SHOULD BE REPORTED IMMEDIATELY:  *FEVER GREATER THAN 101.0 F  *CHILLS WITH OR WITHOUT FEVER  NAUSEA AND VOMITING THAT IS NOT CONTROLLED WITH YOUR NAUSEA MEDICATION  *UNUSUAL SHORTNESS OF BREATH  *UNUSUAL BRUISING OR BLEEDING  TENDERNESS IN MOUTH AND THROAT WITH OR WITHOUT PRESENCE OF ULCERS  *URINARY PROBLEMS  *BOWEL PROBLEMS  UNUSUAL RASH Items with * indicate a potential emergency and should be followed up as soon as possible. If you have an emergency after office hours please contact your primary care physician or go to the nearest emergency department.  Please call the clinic during office hours if you have any questions or concerns.   You may also contact the Patient Navigator at [phone] should you have any questions or need assistance in obtaining follow up care.      Resources For Cancer Patients and their Caregivers ? American Cancer Society: Can assist with transportation, wigs, general needs, runs Look Good Feel Better.        979-112-4446 ? Cancer Care: Provides financial assistance, online support groups,  medication/co-pay assistance.  1-800-813-HOPE 786-324-6023) ? [place] Assists [place] cancer patients and their families through emotional , educational and financial support.  414-513-8381 ? [place] DSS Where to apply for food stamps, Medicaid and utility assistance. 817-840-1608 ? [employer]: Transportation to medical appointments. 249-188-4563 ? Social Security Administration: May apply for disability if have a Stage IV cancer. 4055432301 (928)009-6420 ? [employer], Disability and Transit Services: Assists with nutrition, care and transit needs. 413-826-7856

## 2017-07-22 NOTE — Progress Notes (Signed)
[name] tolerated Herceptin infusion well without complaints or incident. No labs needed for today per Dr. Delton Coombes. Pt instructed to decrease her Tykerb to 4 tabs daily (1000 mg total) instead of 5 daily per MD and pt verbalized understanding. VSS upon discharge. Pt discharged self ambulatory in satisfactory condition

## 2017-08-02 DIAGNOSIS — H3581 Retinal edema: Secondary | ICD-10-CM | POA: Diagnosis not present

## 2017-08-02 DIAGNOSIS — H43813 Vitreous degeneration, bilateral: Secondary | ICD-10-CM | POA: Diagnosis not present

## 2017-08-02 DIAGNOSIS — H34831 Tributary (branch) retinal vein occlusion, right eye, with macular edema: Secondary | ICD-10-CM | POA: Diagnosis not present

## 2017-08-02 DIAGNOSIS — H3563 Retinal hemorrhage, bilateral: Secondary | ICD-10-CM | POA: Diagnosis not present

## 2017-08-04 ENCOUNTER — Other Ambulatory Visit (HOSPITAL_COMMUNITY): Payer: Self-pay | Admitting: Adult Health

## 2017-08-04 ENCOUNTER — Encounter (HOSPITAL_COMMUNITY): Payer: Self-pay | Admitting: Adult Health

## 2017-08-04 DIAGNOSIS — R5382 Chronic fatigue, unspecified: Secondary | ICD-10-CM

## 2017-08-04 DIAGNOSIS — C50919 Malignant neoplasm of unspecified site of unspecified female breast: Secondary | ICD-10-CM

## 2017-08-04 DIAGNOSIS — C229 Malignant neoplasm of liver, not specified as primary or secondary: Secondary | ICD-10-CM

## 2017-08-04 DIAGNOSIS — Z0000 Encounter for general adult medical examination without abnormal findings: Secondary | ICD-10-CM

## 2017-08-04 DIAGNOSIS — C7951 Secondary malignant neoplasm of bone: Secondary | ICD-10-CM

## 2017-08-04 MED ORDER — MORPHINE SULFATE ER 30 MG PO TBCR: 30.0000 mg | EXTENDED_RELEASE_TABLET | Freq: Two times a day (BID) | ORAL | 0 refills | Status: DC

## 2017-08-04 NOTE — Progress Notes (Signed)
Patient called cancer center requesting refill of MS Contin.   [place] Controlled Substance Reporting System reviewed and refill is appropriate on or after 08/04/17. Medication e-scribed to her pharmacy [facility]) using [employer]'s 2-step verification process.    NCCSRS reviewed:     Mike Craze, NP [place] [phone]

## 2017-08-10 ENCOUNTER — Other Ambulatory Visit (HOSPITAL_COMMUNITY): Payer: Self-pay | Admitting: Adult Health

## 2017-08-10 ENCOUNTER — Encounter (HOSPITAL_COMMUNITY): Payer: Self-pay | Admitting: Adult Health

## 2017-08-10 DIAGNOSIS — C7951 Secondary malignant neoplasm of bone: Secondary | ICD-10-CM

## 2017-08-10 DIAGNOSIS — C229 Malignant neoplasm of liver, not specified as primary or secondary: Secondary | ICD-10-CM

## 2017-08-10 DIAGNOSIS — Z0000 Encounter for general adult medical examination without abnormal findings: Secondary | ICD-10-CM

## 2017-08-10 DIAGNOSIS — R5382 Chronic fatigue, unspecified: Secondary | ICD-10-CM

## 2017-08-10 DIAGNOSIS — C50919 Malignant neoplasm of unspecified site of unspecified female breast: Secondary | ICD-10-CM

## 2017-08-10 NOTE — Progress Notes (Signed)
Received refill request from pharmacy for Ativan.   [place] Controlled Substance Reporting System reviewed and refill is appropriate on or after 08/10/17. Medication e-scribed to her pharmacy [facility]) using [employer]'s 2-step verification process.    NCCSRS reviewed:     Mike Craze, NP [place] [phone]

## 2017-08-12 ENCOUNTER — Ambulatory Visit (HOSPITAL_COMMUNITY): Payer: Self-pay

## 2017-08-20 ENCOUNTER — Encounter (HOSPITAL_COMMUNITY): Payer: Self-pay

## 2017-08-20 ENCOUNTER — Other Ambulatory Visit: Payer: Self-pay

## 2017-08-20 ENCOUNTER — Inpatient Hospital Stay (HOSPITAL_COMMUNITY): Payer: Medicare Other | Attending: Adult Health

## 2017-08-20 VITALS — BP 120/62 | HR 71 | Temp 98.0°F | Resp 18 | Wt 258.8 lb

## 2017-08-20 DIAGNOSIS — C50919 Malignant neoplasm of unspecified site of unspecified female breast: Secondary | ICD-10-CM | POA: Insufficient documentation

## 2017-08-20 DIAGNOSIS — Z5112 Encounter for antineoplastic immunotherapy: Secondary | ICD-10-CM | POA: Insufficient documentation

## 2017-08-20 DIAGNOSIS — Z170 Estrogen receptor positive status [ER+]: Secondary | ICD-10-CM | POA: Diagnosis not present

## 2017-08-20 DIAGNOSIS — C7951 Secondary malignant neoplasm of bone: Secondary | ICD-10-CM | POA: Diagnosis not present

## 2017-08-20 LAB — CBC WITH DIFFERENTIAL/PLATELET
Basophils Absolute: 0 10*3/uL (ref 0.0–0.1)
Basophils Relative: 0 %
EOS PCT: 0 %
Eosinophils Absolute: 0 10*3/uL (ref 0.0–0.7)
HCT: 35.4 % — ABNORMAL LOW (ref 36.0–46.0)
Hemoglobin: 10.6 g/dL — ABNORMAL LOW (ref 12.0–15.0)
LYMPHS ABS: 0.9 10*3/uL (ref 0.7–4.0)
LYMPHS PCT: 15 %
MCH: 25.6 pg — AB (ref 26.0–34.0)
MCHC: 29.9 g/dL — AB (ref 30.0–36.0)
MCV: 85.5 fL (ref 78.0–100.0)
MONOS PCT: 8 %
Monocytes Absolute: 0.5 10*3/uL (ref 0.1–1.0)
Neutro Abs: 4.6 10*3/uL (ref 1.7–7.7)
Neutrophils Relative %: 77 %
PLATELETS: 266 10*3/uL (ref 150–400)
RBC: 4.14 MIL/uL (ref 3.87–5.11)
RDW: 17.4 % — AB (ref 11.5–15.5)
WBC: 6 10*3/uL (ref 4.0–10.5)

## 2017-08-20 LAB — COMPREHENSIVE METABOLIC PANEL
ALT: 21 U/L (ref 14–54)
AST: 22 U/L (ref 15–41)
Albumin: 3.7 g/dL (ref 3.5–5.0)
Alkaline Phosphatase: 98 U/L (ref 38–126)
Anion gap: 12 (ref 5–15)
BILIRUBIN TOTAL: 0.4 mg/dL (ref 0.3–1.2)
BUN: 11 mg/dL (ref 6–20)
CHLORIDE: 98 mmol/L — AB (ref 101–111)
CO2: 27 mmol/L (ref 22–32)
Calcium: 9.3 mg/dL (ref 8.9–10.3)
Creatinine, Ser: 0.89 mg/dL (ref 0.44–1.00)
GFR calc Af Amer: >60 mL/min (ref >60)
GFR calc non Af Amer: >60 mL/min (ref >60)
GLUCOSE: 131 mg/dL — AB (ref 65–99)
POTASSIUM: 3.6 mmol/L (ref 3.5–5.1)
Sodium: 137 mmol/L (ref 135–145)
TOTAL PROTEIN: 7.2 g/dL (ref 6.5–8.1)

## 2017-08-20 MED ORDER — ACETAMINOPHEN 325 MG PO TABS
650.0000 mg | ORAL_TABLET | Freq: Once | ORAL | Status: AC
  Administered 2017-08-20: 650 mg via ORAL

## 2017-08-20 MED ORDER — DIPHENHYDRAMINE HCL 25 MG PO CAPS
50.0000 mg | ORAL_CAPSULE | Freq: Once | ORAL | Status: AC
  Administered 2017-08-20: 50 mg via ORAL
  Filled 2017-08-20: qty 2

## 2017-08-20 MED ORDER — SODIUM CHLORIDE 0.9 % IV SOLN
Freq: Once | INTRAVENOUS | Status: AC
  Administered 2017-08-20: 12:00:00 via INTRAVENOUS

## 2017-08-20 MED ORDER — SODIUM CHLORIDE 0.9% FLUSH
10.0000 mL | INTRAVENOUS | Status: DC | PRN
Start: 2017-08-20 — End: 2017-08-20
  Administered 2017-08-20: 10 mL
  Filled 2017-08-20: qty 10

## 2017-08-20 MED ORDER — ACETAMINOPHEN 325 MG PO TABS
ORAL_TABLET | ORAL | Status: AC
  Filled 2017-08-20: qty 2

## 2017-08-20 MED ORDER — DIPHENHYDRAMINE HCL 25 MG PO CAPS
ORAL_CAPSULE | ORAL | Status: AC
  Filled 2017-08-20: qty 1

## 2017-08-20 MED ORDER — HEPARIN SOD (PORK) LOCK FLUSH 100 UNIT/ML IV SOLN
500.0000 [IU] | Freq: Once | INTRAVENOUS | Status: AC | PRN
  Administered 2017-08-20: 500 [IU]
  Filled 2017-08-20 (×2): qty 5

## 2017-08-20 MED ORDER — TRASTUZUMAB CHEMO 150 MG IV SOLR
750.0000 mg | Freq: Once | INTRAVENOUS | Status: AC
  Administered 2017-08-20: 750 mg via INTRAVENOUS
  Filled 2017-08-20: qty 35.72

## 2017-08-20 NOTE — Progress Notes (Signed)
Treatment given per orders. Patient tolerated it well without problems. Vitals stable and discharged home from clinic ambulatory. Follow up as scheduled.  

## 2017-08-20 NOTE — Progress Notes (Signed)
Dr. Raliegh Ip said that patient does not need a 2d echo but every 6 months. Patient notified and was agreeable to plan.   Patient taking 4 tablets of Tykerb daily and reports no side effects or missed doses.

## 2017-08-20 NOTE — Patient Instructions (Signed)
[facility] Discharge Instructions for Patients Receiving Chemotherapy   Beginning January 23rd 2017 lab work for the Cancer Center will be done in the  Main lab at [place] on 1st floor. If you have a lab appointment with the Cancer Center please come in thru the  Main Entrance and check in at the main information desk   Today you received the following chemotherapy agents   To help prevent nausea and vomiting after your treatment, we encourage you to take your nausea medication     If you develop nausea and vomiting, or diarrhea that is not controlled by your medication, call the clinic.  The clinic phone number is [phone]. Office hours are Monday-Friday 8:30am-5:00pm.  BELOW ARE SYMPTOMS THAT SHOULD BE REPORTED IMMEDIATELY:  *FEVER GREATER THAN 101.0 F  *CHILLS WITH OR WITHOUT FEVER  NAUSEA AND VOMITING THAT IS NOT CONTROLLED WITH YOUR NAUSEA MEDICATION  *UNUSUAL SHORTNESS OF BREATH  *UNUSUAL BRUISING OR BLEEDING  TENDERNESS IN MOUTH AND THROAT WITH OR WITHOUT PRESENCE OF ULCERS  *URINARY PROBLEMS  *BOWEL PROBLEMS  UNUSUAL RASH Items with * indicate a potential emergency and should be followed up as soon as possible. If you have an emergency after office hours please contact your primary care physician or go to the nearest emergency department.  Please call the clinic during office hours if you have any questions or concerns.   You may also contact the Patient Navigator at [phone] should you have any questions or need assistance in obtaining follow up care.      Resources For Cancer Patients and their Caregivers ? American Cancer Society: Can assist with transportation, wigs, general needs, runs Look Good Feel Better.        1-888-227-6333 ? Cancer Care: Provides financial assistance, online support groups, medication/co-pay assistance.  1-800-813-HOPE (4673) ? [facility] Assists [place] cancer  patients and their families through emotional , educational and financial support.  336-427-4357 ? [place] DSS Where to apply for food stamps, Medicaid and utility assistance. 336-342-1394 ? [employer]: Transportation to medical appointments. 336-347-2287 ? Social Security Administration: May apply for disability if have a Stage IV cancer. 336-342-7796 1-800-772-1213 ? [place] [employer], Disability and Transit Services: Assists with nutrition, care and transit needs. 336-349-2343         

## 2017-08-21 LAB — CANCER ANTIGEN 27.29: CA 27.29: 85.8 U/mL — ABNORMAL HIGH (ref 0.0–38.6)

## 2017-08-21 LAB — CANCER ANTIGEN 15-3: CA 15-3: 77.6 U/mL — ABNORMAL HIGH (ref 0.0–25.0)

## 2017-08-24 ENCOUNTER — Other Ambulatory Visit (HOSPITAL_COMMUNITY): Payer: Self-pay

## 2017-08-24 MED ORDER — LAPATINIB DITOSYLATE 250 MG PO TABS: 1000.0000 mg | ORAL_TABLET | Freq: Every day | ORAL | 0 refills | Status: DC

## 2017-08-30 ENCOUNTER — Other Ambulatory Visit (HOSPITAL_COMMUNITY): Payer: Self-pay | Admitting: Adult Health

## 2017-08-30 DIAGNOSIS — G4700 Insomnia, unspecified: Secondary | ICD-10-CM

## 2017-08-31 ENCOUNTER — Other Ambulatory Visit (HOSPITAL_COMMUNITY): Payer: Self-pay | Admitting: Adult Health

## 2017-08-31 ENCOUNTER — Encounter (HOSPITAL_COMMUNITY): Payer: Self-pay | Admitting: Adult Health

## 2017-08-31 DIAGNOSIS — G4700 Insomnia, unspecified: Secondary | ICD-10-CM

## 2017-08-31 MED ORDER — ZOLPIDEM TARTRATE 10 MG PO TABS: 10.0000 mg | ORAL_TABLET | Freq: Every evening | ORAL | 2 refills | Status: DC | PRN

## 2017-08-31 NOTE — Progress Notes (Signed)
Received refill request from pharmacy for Ambien.   [place] Controlled Substance Reporting System reviewed and refill is appropriate on or after 09/09/17. Medication e-scribed to her pharmacy [facility]) using [employer]'s 2-step verification process.    NCCSRS reviewed:     Mike Craze, NP [place] [phone]

## 2017-09-02 ENCOUNTER — Other Ambulatory Visit (HOSPITAL_COMMUNITY): Payer: Self-pay | Admitting: Adult Health

## 2017-09-02 ENCOUNTER — Ambulatory Visit (HOSPITAL_COMMUNITY): Payer: Self-pay | Admitting: Hematology

## 2017-09-02 ENCOUNTER — Encounter (HOSPITAL_COMMUNITY): Payer: Self-pay | Admitting: Adult Health

## 2017-09-02 ENCOUNTER — Ambulatory Visit (HOSPITAL_COMMUNITY): Payer: Self-pay

## 2017-09-02 DIAGNOSIS — C50919 Malignant neoplasm of unspecified site of unspecified female breast: Secondary | ICD-10-CM

## 2017-09-02 DIAGNOSIS — R5382 Chronic fatigue, unspecified: Secondary | ICD-10-CM

## 2017-09-02 DIAGNOSIS — Z0000 Encounter for general adult medical examination without abnormal findings: Secondary | ICD-10-CM

## 2017-09-02 DIAGNOSIS — C7951 Secondary malignant neoplasm of bone: Secondary | ICD-10-CM

## 2017-09-02 DIAGNOSIS — C229 Malignant neoplasm of liver, not specified as primary or secondary: Secondary | ICD-10-CM

## 2017-09-02 NOTE — Telephone Encounter (Signed)
Just being sure you got this.Marland KitchenMarland KitchenMarland Kitchen

## 2017-09-02 NOTE — Progress Notes (Signed)
Received refill request for MS Contin from pharmacy.    Controlled Substance Reporting System reviewed and refill is appropriate on or after 09/03/17. Medication e-scribed to her pharmacy [facility]) using [employer] 2-step verification process.    NCCSRS reviewed:     Mike Craze, NP [place] [phone]

## 2017-09-16 ENCOUNTER — Other Ambulatory Visit (HOSPITAL_COMMUNITY): Payer: Self-pay

## 2017-09-16 DIAGNOSIS — C7951 Secondary malignant neoplasm of bone: Secondary | ICD-10-CM

## 2017-09-16 DIAGNOSIS — C50919 Malignant neoplasm of unspecified site of unspecified female breast: Secondary | ICD-10-CM

## 2017-09-17 ENCOUNTER — Inpatient Hospital Stay (HOSPITAL_BASED_OUTPATIENT_CLINIC_OR_DEPARTMENT_OTHER): Payer: Medicare Other | Admitting: Hematology

## 2017-09-17 ENCOUNTER — Encounter (HOSPITAL_COMMUNITY): Payer: Self-pay | Admitting: Hematology

## 2017-09-17 ENCOUNTER — Other Ambulatory Visit: Payer: Self-pay

## 2017-09-17 ENCOUNTER — Inpatient Hospital Stay (HOSPITAL_COMMUNITY): Payer: Medicare Other | Attending: Adult Health

## 2017-09-17 VITALS — BP 107/51 | HR 68 | Temp 98.2°F | Resp 16 | Wt 261.5 lb

## 2017-09-17 VITALS — BP 97/53 | HR 69 | Temp 97.8°F | Resp 16

## 2017-09-17 DIAGNOSIS — C7931 Secondary malignant neoplasm of brain: Secondary | ICD-10-CM | POA: Diagnosis not present

## 2017-09-17 DIAGNOSIS — C7951 Secondary malignant neoplasm of bone: Secondary | ICD-10-CM | POA: Insufficient documentation

## 2017-09-17 DIAGNOSIS — Z5112 Encounter for antineoplastic immunotherapy: Secondary | ICD-10-CM | POA: Insufficient documentation

## 2017-09-17 DIAGNOSIS — C50919 Malignant neoplasm of unspecified site of unspecified female breast: Secondary | ICD-10-CM

## 2017-09-17 DIAGNOSIS — C787 Secondary malignant neoplasm of liver and intrahepatic bile duct: Secondary | ICD-10-CM | POA: Diagnosis present

## 2017-09-17 DIAGNOSIS — Z9221 Personal history of antineoplastic chemotherapy: Secondary | ICD-10-CM | POA: Diagnosis not present

## 2017-09-17 DIAGNOSIS — Z923 Personal history of irradiation: Secondary | ICD-10-CM

## 2017-09-17 DIAGNOSIS — E876 Hypokalemia: Secondary | ICD-10-CM | POA: Diagnosis not present

## 2017-09-17 LAB — COMPREHENSIVE METABOLIC PANEL
ALBUMIN: 3.5 g/dL (ref 3.5–5.0)
ALK PHOS: 97 U/L (ref 38–126)
ALT: 16 U/L (ref 14–54)
ANION GAP: 9 (ref 5–15)
AST: 22 U/L (ref 15–41)
BILIRUBIN TOTAL: 0.3 mg/dL (ref 0.3–1.2)
BUN: 10 mg/dL (ref 6–20)
CALCIUM: 9.2 mg/dL (ref 8.9–10.3)
CO2: 29 mmol/L (ref 22–32)
CREATININE: 0.94 mg/dL (ref 0.44–1.00)
Chloride: 100 mmol/L — ABNORMAL LOW (ref 101–111)
GFR calc Af Amer: >60 mL/min (ref >60)
GFR calc non Af Amer: >60 mL/min (ref >60)
Glucose, Bld: 152 mg/dL — ABNORMAL HIGH (ref 65–99)
Potassium: 3.4 mmol/L — ABNORMAL LOW (ref 3.5–5.1)
Sodium: 138 mmol/L (ref 135–145)
TOTAL PROTEIN: 7.1 g/dL (ref 6.5–8.1)

## 2017-09-17 LAB — CBC WITH DIFFERENTIAL/PLATELET
Basophils Absolute: 0 K/uL (ref 0.0–0.1)
Basophils Relative: 0 %
Eosinophils Absolute: 0 K/uL (ref 0.0–0.7)
Eosinophils Relative: 0 %
HCT: 32.8 % — ABNORMAL LOW (ref 36.0–46.0)
Hemoglobin: 10 g/dL — ABNORMAL LOW (ref 12.0–15.0)
Lymphocytes Relative: 17 %
Lymphs Abs: 0.9 K/uL (ref 0.7–4.0)
MCH: 25.7 pg — ABNORMAL LOW (ref 26.0–34.0)
MCHC: 30.5 g/dL (ref 30.0–36.0)
MCV: 84.3 fL (ref 78.0–100.0)
Monocytes Absolute: 0.3 K/uL (ref 0.1–1.0)
Monocytes Relative: 5 %
Neutro Abs: 4.3 K/uL (ref 1.7–7.7)
Neutrophils Relative %: 78 %
Platelets: 249 K/uL (ref 150–400)
RBC: 3.89 MIL/uL (ref 3.87–5.11)
RDW: 16.6 % — ABNORMAL HIGH (ref 11.5–15.5)
WBC: 5.4 K/uL (ref 4.0–10.5)

## 2017-09-17 MED ORDER — ACETAMINOPHEN 325 MG PO TABS
650.0000 mg | ORAL_TABLET | Freq: Once | ORAL | Status: AC
  Administered 2017-09-17: 650 mg via ORAL
  Filled 2017-09-17: qty 2

## 2017-09-17 MED ORDER — TRASTUZUMAB CHEMO 150 MG IV SOLR
750.0000 mg | Freq: Once | INTRAVENOUS | Status: AC
  Administered 2017-09-17: 750 mg via INTRAVENOUS
  Filled 2017-09-17: qty 35.72

## 2017-09-17 MED ORDER — HEPARIN SOD (PORK) LOCK FLUSH 100 UNIT/ML IV SOLN
500.0000 [IU] | Freq: Once | INTRAVENOUS | Status: AC | PRN
  Administered 2017-09-17: 500 [IU]

## 2017-09-17 MED ORDER — SODIUM CHLORIDE 0.9 % IV SOLN
Freq: Once | INTRAVENOUS | Status: AC
  Administered 2017-09-17: 11:00:00 via INTRAVENOUS

## 2017-09-17 MED ORDER — ZOLEDRONIC ACID 4 MG/100ML IV SOLN
4.0000 mg | Freq: Once | INTRAVENOUS | Status: AC
  Administered 2017-09-17: 4 mg via INTRAVENOUS
  Filled 2017-09-17: qty 100

## 2017-09-17 MED ORDER — SODIUM CHLORIDE 0.9% FLUSH
10.0000 mL | INTRAVENOUS | Status: DC | PRN
  Administered 2017-09-17: 10 mL
  Filled 2017-09-17: qty 10

## 2017-09-17 MED ORDER — DIPHENHYDRAMINE HCL 25 MG PO CAPS
50.0000 mg | ORAL_CAPSULE | Freq: Once | ORAL | Status: AC
  Administered 2017-09-17: 50 mg via ORAL
  Filled 2017-09-17: qty 2

## 2017-09-17 NOTE — Patient Instructions (Signed)
[place] Discharge Instructions for Patients Receiving Chemotherapy  Today you received the following chemotherapy agents herceptin and zometa.    If you develop nausea and vomiting that is not controlled by your nausea medication, call the clinic.   BELOW ARE SYMPTOMS THAT SHOULD BE REPORTED IMMEDIATELY:  *FEVER GREATER THAN 100.5 F  *CHILLS WITH OR WITHOUT FEVER  NAUSEA AND VOMITING THAT IS NOT CONTROLLED WITH YOUR NAUSEA MEDICATION  *UNUSUAL SHORTNESS OF BREATH  *UNUSUAL BRUISING OR BLEEDING  TENDERNESS IN MOUTH AND THROAT WITH OR WITHOUT PRESENCE OF ULCERS  *URINARY PROBLEMS  *BOWEL PROBLEMS  UNUSUAL RASH Items with * indicate a potential emergency and should be followed up as soon as possible.  Feel free to call the clinic should you have any questions or concerns. The clinic phone number is [phone].  Please show the [place] at check-in to the Emergency Department and triage nurse.

## 2017-09-17 NOTE — Progress Notes (Signed)
Patient tolerated zometa and herceptin with no complaints voiced.  Port site clean and dry with no bruising or swelling noted at site.  Good blood return noted before and after administration of chemotherapy.  Band aid applied.  VSS with discharge and left ambulatory with husband and no s/s of distress noted.

## 2017-09-17 NOTE — Progress Notes (Signed)
Patient Care Team: [name], MD as PCP - General  DIAGNOSIS: HER-2 positive metastatic breast cancer to the bones.  SUMMARY OF ONCOLOGIC HISTORY:   Breast cancer, stage 4 ([name])   04/25/2014 Initial Diagnosis    Breast cancer, stage 4      04/25/2014 Imaging    CT abdomen/pelvis with widespread metastatic disease to the liver, multiple lytic lesions throughout spine and pelvis. No FX or epidural tumor identified      04/26/2014 Imaging    CT head unremarkable      04/26/2014 Imaging    CT chest with no lung mass or pulmonary nodules, no adenopathy. Lytic bone lesions, right 2nd rib      04/27/2014 Initial Biopsy    U/S guided liver biopsy, lesion in anterior and inferior left hepatic lobe biopsied      04/27/2014 Pathology Results    Metastatic adenocarcinoma, CK7, ER+, patchy positivity with PR. Possible primary includes breast, less likely gynecologic      05/15/2014 Mammogram    BI-RADS CATEGORY  2: Benign Finding(s)      05/16/2014 PET scan    1. Intensely hypermetabolic hepatic metastasis. 2. Widespread hypermetabolic skeletal lesions. 3. No primary adenocarcinoma identified by FDG PET imaging.      05/19/2014 Imaging    MUGA- Left ventricular ejection fracture greater than 70%.      05/21/2014 Breast MRI    No suspicious masses or enhancement within the breasts. No axillary adenopathy.      05/22/2014 - 07/03/2014 Antibody Plan    Herceptin/Perjeta/Tamoxifen      06/12/2014 - 07/03/2014 Chemotherapy    Taxotere added secondary to persistent abdominal and back pain      06/17/2014 - 06/19/2014 Hospital Admission    Neutropenia, fever, diarrhea, nausea, vomiting      06/20/2014 - 07/10/2014 Radiation Therapy    Dr. Thea Silversmith 12 fractions to L3-S3 (30 Gy) and left scapula (20 Gy).       07/03/2014 Adverse Reaction    Perjeta- induced diarrhea.  Perjeta discontinued      07/16/2014 - 07/20/2014 Hospital Admission    Electrolyte abnormalities, and  diarrhea.  Suspect Perjeta-induced diarrhea.  Negative GI work-up.      07/24/2014 - 08/19/2015 Chemotherapy    Herceptin/Tamoxifen/Xgeva      08/21/2014 Imaging    MUGA- Left ventricular ejection fraction equals 71%.      08/24/2014 PET scan    Dramatic reduction in metabolic activity of the widespread liver metastasis. Liver metastasis now have metabolic activity equal to background normal liver activity. Liver has a nodular contour. Marked reduction in metabolic activity of skeletal lesions..      10/05/2014 Progression    Widespread metastatic disease to the brain as described. Between 20 and 30 intracranial metastatic deposits are now seen. No midline shift or incipient herniation      10/09/2014 - 10/26/2014 Radiation Therapy    Whole Brain XRT      11/14/2014 Imaging    MUGA- LVEF 67%      02/13/2015 Imaging    MUGA- LVEF 59%      02/15/2015 Treatment Plan Change    Due to declining LVEF, will hold Herceptin per PI guidelines.      04/12/2015 -  Chemotherapy    Herceptin restarted      06/02/2015 - 06/08/2015 Hospital Admission    Pneumonia      07/05/2015 Progression     PET/CT concern for mild progression of skeletal metastasis with  several lesions within the spine and 1 lesion in the Left iliac wing with mild to moderate metabolic activity new from prior. Rising CA 27-29      07/16/2015 - 10/23/2015 Anti-estrogen oral therapy    Arimidex      07/16/2015 Imaging    MRI brain with satisfactory post treatment apperance of brain. interval resolved enhancing R caudate metastasis, minimal punctate residual enhancing metastatic disease at the inferior L cerebellum. No new metastatic disease or new intracranial abnormality      07/19/2015 Treatment Plan Change    Discontinue Tamoxifen, Zoladex plus Arimidex.       08/27/2015 Procedure    Laparoscopic bilateral salpingo-oophorectomy by Dr. Gaetano Net      10/17/2015 PET scan    Osseous metastatic disease appears slightly  progressive based on a new right scapular lesion and increased uptake within lesions in the thoracic spine, left iliac wing and  proximal right femur.      10/17/2015 Progression    Slight progression on PET scan imaging.      10/18/2015 Imaging    REsolved enhancing metastatic disease to the brain status post WBXRT      10/23/2015 - 02/12/2016 Adjuvant Chemotherapy    Faslodex loading followed by maintenance dose.  (Herceptin continued)      11/04/2015 Imaging    MUGA- LEFT ventricular ejection fraction 51% slightly decreased in a 57% on the previous exam.      12/24/2015 Treatment Plan Change    Zometa every 28 days.  Xgeva discontinued.        01/01/2016 Imaging    MUGA- Left ventricular ejection fraction equals 57.9%. This is increased from 51.1% previously.      02/03/2016 PET scan    1. Mixed metabolic changes in the scattered hypermetabolic sclerotic osseous metastases throughout the axial and proximal appendicular skeleton as detailed above. 2. No new sites of hypermetabolic metastatic disease. Stable pseudo-cirrhotic appearance of the liver due to treated liver metastases with no hypermetabolic liver metastases.       02/03/2016 Progression    PET shows mixed osseous response.  Some lesions more hypermetabolic, others improved.      02/05/2016 Treatment Plan Change    D/C Herceptin.  Faslodex as scheduled on 02/12/2016, then discontinued.  Continue Zometa.      02/05/2016 Treatment Plan Change    Prescriptions for Xeloda 7 days on and 7 days off and Tykerb printed and provided for authorization.      02/19/2016 -  Chemotherapy    Xeloda 2300 mg BID 7 days on and 7 days off and Tykerb       02/24/2016 Treatment Plan Change    Xeloda dose reduced by 10% to 2000 mg BID week on and week off.      03/18/2016 Treatment Plan Change    Xeloda dose reduced to 2000 mg in AM and 1500 mg in PM 7 days on and 7 days off.      03/23/2016 Imaging    MUGA- Normal LEFT  ventricular ejection fraction of 56% not significantly changed from 58% on previous exam.      05/19/2016 Imaging    MRI brain- . No new focus of abnormal enhancement to suggest interval metastatic disease. 2. No acute intracranial abnormality. 3. Stable foci of T2 FLAIR hyperintensity in white matter and in the right caudate head compatible with posttreatment changes and treated metastasis.      05/20/2016 PET scan    1. The multiple osseous metastatic lesions  shown to be hypermetabolic on the prior exam are reduced in activity or even resolved in hypermetabolic activity compared to prior, as detailed above. There are also numerous sclerotic bony lesions wedge are not currently and were not previously hypermetabolic, compatible with old non active lesions. 2. No findings of extra osseous metastatic disease currently. 3. Hepatic pseudocirrhosis. 4. Healing rib fractures. Chronic pathologic fracture the left acromion.      07/08/2016 Imaging    MUGA- Low normal LEFT ventricular ejection fraction of 53%, little changed since the 56% on 03/23/2016 but slightly decreased from the 58% on 01/01/2016.      09/07/2016 Echocardiogram    MUGA- Normal LEFT ventricular ejection fraction of 59% with normal LV wall motion.      09/07/2016 Treatment Plan Change    Xeloda dose reduced to 1500 mg BID 7 days on and 7 days off.      09/15/2016 PET scan    No significant change in diffuse sclerotic bone metastases on CT images, although several show mildly increased FDG uptake since prior study.  No evidence of soft tissue metastatic disease.      11/02/2016 Imaging    MRI brain- No change from the prior study. Previously noted metastatic deposits have been treated. No new lesions identified.       CHIEF COMPLIANT: Follow-up of metastatic breast cancer.  INTERVAL HISTORY: [name] is a 52 year old pleasant female who is seen today for follow-up of metastatic breast cancer.  She  reports tolerating Tykerb 4 tablets daily very well.  She denies any diarrhea from Tykerb.  However she gets diarrhea from Herceptin.  After the first dose of Herceptin she had diarrhea for 2 weeks.  After the second dose she had diarrhea for 2 days.  She takes Imodium as needed.  She denies any fevers or infections.  No new onset pains were reported.  No headaches or vision changes were reported.  REVIEW OF SYSTEMS:   Constitutional: Denies fevers, chills or abnormal weight loss.  She has mild fatigue which is stable. Eyes: Denies blurriness of vision Ears, nose, mouth, throat, and face: Denies mucositis or sore throat Respiratory: Denies cough, dyspnea or wheezes Cardiovascular: Denies palpitation, chest discomfort Gastrointestinal:  Denies nausea, heartburn or change in bowel habits.  She has diarrhea after receiving Herceptin. Skin: Denies abnormal skin rashes Lymphatics: Denies new lymphadenopathy or easy bruising Neurological:Denies numbness, tingling or new weaknesses Behavioral/Psych: Mood is stable, no new changes  Extremities: No lower extremity edema Breast:  denies any pain or lumps or nodules in either breasts All other systems were reviewed with the patient and are negative.  I have reviewed the past medical history, past surgical history, social history and family history with the patient and they are unchanged from previous note.  ALLERGIES:  has No Known Allergies.  MEDICATIONS:  Current Outpatient Medications  Medication Sig Dispense Refill  . acetaminophen (TYLENOL) 325 MG tablet Take 650 mg by mouth every 6 (six) hours as needed for moderate pain or headache.    . calcium carbonate (TUMS - DOSED IN MG ELEMENTAL CALCIUM) 500 MG chewable tablet Chew 2 tablets by mouth daily.     . Calcium-Phosphorus-Vitamin D (CALCIUM/D3 ADULT GUMMIES PO) Take 2 tablets by mouth 2 (two) times daily. dosage of calcium 1277m and vitamin d 10026m   . desvenlafaxine (PRISTIQ) 100 MG 24 hr  tablet Take 1 tablet (100 mg total) by mouth daily. 90 tablet 1  . Difluprednate (DUREZOL) 0.05 % EMUL  Apply to eye 2 (two) times daily.    . diphenhydrAMINE (BENADRYL) 25 mg capsule Take 25 mg by mouth every 6 (six) hours as needed for itching.    . gabapentin (NEURONTIN) 300 MG capsule Take 2 capsules (600 mg total) by mouth at bedtime. 60 capsule 5  . ketorolac (ACULAR) 0.4 % SOLN INSTILL ONE DROP IN THE RIGHT EYE TWO TIMES DAILY  1  . lapatinib (TYKERB) 250 MG tablet Take 4 tablets (1,000 mg total) by mouth daily. 120 tablet 0  . lidocaine-prilocaine (EMLA) cream Apply 1 application topically daily as needed (apply to port before chemo). 30 g 2  . LORazepam (ATIVAN) 0.5 MG tablet TAKE ONE TABLET BY MOUTH EVERY 6 HOURS AS NEEDED FOR ANXIETY 30 tablet 2  . Melatonin 10 MG CAPS Take 1 tablet by mouth. Pt states she takes one tablet at night for sleep    . methylphenidate (RITALIN) 20 MG tablet Take 20 mg by mouth daily.    Marland Kitchen morphine (MS CONTIN) 30 MG 12 hr tablet TAKE 1 TABLET BY MOUTH EVERY TWELVE HOURS 60 tablet 0  . NARCAN 4 MG/0.1ML LIQD nasal spray kit CALL 911. ADMINISTER A SINGLE SPRAY OF NARCAN IN ONE NOSTRIL, REPEAT EVERY 3 MINUTES AS NEEDED IF NO OR MINIMAL RESPONSE  99  . ondansetron (ZOFRAN) 8 MG tablet Take 1 tablet (8 mg total) by mouth every 8 (eight) hours as needed for nausea or vomiting. 60 tablet 4  . oxyCODONE (OXY IR/ROXICODONE) 5 MG immediate release tablet Take 1-2 tablets (5-10 mg total) by mouth every 4 (four) hours as needed for moderate pain. 60 tablet 0  . pantoprazole (PROTONIX) 40 MG tablet TAKE ONE TABLET BY MOUTH EVERY TWELVE HOURS 60 tablet 3  . promethazine (PHENERGAN) 25 MG tablet TAKE 1 TABLET BY MOUTH EVERY SIX HOURS AS NEEDED FOR NAUSEA OR VOMITING 30 tablet 6  . rivaroxaban (XARELTO) 20 MG TABS tablet TAKE 1 TABLET BY MOUTH EVERY DAY WITH SUPPER 30 tablet 3  . spironolactone (ALDACTONE) 25 MG tablet Take 1 tablet (25 mg total) by mouth daily. 30 tablet 3  .  Zoledronic Acid (ZOMETA IV) Inject into the vein every 3 (three) months.    . zolpidem (AMBIEN) 10 MG tablet Take 1 tablet (10 mg total) by mouth at bedtime as needed. 30 tablet 2   No current facility-administered medications for this visit.     PHYSICAL EXAMINATION: ECOG PERFORMANCE STATUS: 1 - Symptomatic but completely ambulatory  I have reviewed her vitals. GENERAL:alert, no distress and comfortable SKIN: skin color, texture, turgor are normal, no rashes or significant lesions EYES: normal, Conjunctiva are pink and non-injected, sclera clear OROPHARYNX:no mucositis, no erythema and lips, buccal mucosa, and tongue normal  LYMPH:  no palpable lymphadenopathy in the cervical, axillary or inguinal LUNGS: clear to auscultation and percussion with normal breathing effort HEART: regular rate & rhythm and no murmurs and no lower extremity edema ABDOMEN:abdomen soft, non-tender and normal bowel sounds EXTREMITIES: No lower extremity edema BREAST: Examination not done today.  LABORATORY DATA:  I have reviewed the data as listed CMP Latest Ref Rng & Units 08/20/2017 06/23/2017 05/26/2017  Glucose 65 - 99 mg/dL 131(H) 103(H) 144(H)  BUN 6 - 20 mg/dL _0 Creatinine 0.44 - 1.00 mg/dL 0.89 0.83 0.97  Sodium 135 - 145 mmol/L 137 136 136  Potassium 3.5 - 5.1 mmol/L 3.6 3.7 3.7  Chloride 101 - 111 mmol/L 98(L) 99(L) 97(L)  CO2 22 - 32 mmol/L  _0 Calcium 8.9 - 10.3 mg/dL 9.3 9.2 9.6  Total Protein 6.5 - 8.1 g/dL 7.2 7.2 7.8  Total Bilirubin 0.3 - 1.2 mg/dL 0.4 0.2(L) 0.7  Alkaline Phos 38 - 126 U/L 98 91 96  AST 15 - 41 U/L _1 ALT 14 - 54 U/L _2 Lab Results  Component Value Date   CAN153 77.6 (H) 08/20/2017   CAN153 89.1 (H) 06/23/2017   CAN153 91.5 (H) 05/26/2017     Lab Results  Component Value Date   WBC 6.0 08/20/2017   HGB 10.6 (L) 08/20/2017   HCT 35.4 (L) 08/20/2017   MCV 85.5 08/20/2017   PLT 266 08/20/2017   NEUTROABS 4.6 08/20/2017     ASSESSMENT & PLAN:  Breast cancer, stage 4 ([place]) 1.  Metastatic HER-2 positive breast cancer to the bones and brain: - Patient took Xeloda and Tykerb for over a year, Dilaudid discontinued secondary to poor tolerance. - Herceptin and Tykerb 1000 milligrams daily started on 07/23/2017, tolerating very well.  She had diarrhea lasting about 2 weeks after first dose of Herceptin.  She had diarrhea for 2 days after the second dose.  She is getting Herceptin every 4 weeks.  I told her to receive it every 3 weeks.  She will continue Tykerb as she is tolerating it well.  Her last 2D echocardiogram was in February with EF of 60%.  I will repeat another echocardiogram.  She will continue with today's dose.  I plan to see her back in 6 weeks with a repeat PET CT scan and MRI of the brain.  Her tumor markers are trending down which is a good sign.  Last PET/CT scan on 03/24/2017 showed no evidence of hypermetabolic soft tissue meta stasis, with no significant change in the hypermetabolic bone metastasis, her index lesion being right glenoid.  2.  Bone metastasis: We have switched her Zometa to once every 3 months.  She will receive her dose today.  She will continue calcium and vitamin D supplements.    This note includes documentation from Mike Craze, NP, who was present during this patient's office visit and evaluation.  I have reviewed this note for its completeness and accuracy.  I have edited this note accordingly based on my findings and medical opinion.      Derek Jack 09/17/17

## 2017-09-17 NOTE — Assessment & Plan Note (Signed)
1.  Metastatic HER-2 positive breast cancer to the bones and brain: - Patient took Xeloda and Tykerb for over a year, Dilaudid discontinued secondary to poor tolerance. - Herceptin and Tykerb 1000 milligrams daily started on 07/23/2017, tolerating very well.  She had diarrhea lasting about 2 weeks after first dose of Herceptin.  She had diarrhea for 2 days after the second dose.  She is getting Herceptin every 4 weeks.  I told her to receive it every 3 weeks.  She will continue Tykerb as she is tolerating it well.  Her last 2D echocardiogram was in February with EF of 60%.  I will repeat another echocardiogram.  She will continue with today's dose.  I plan to see her back in 6 weeks with a repeat PET CT scan and MRI of the brain.  Her tumor markers are trending down which is a good sign.  Last PET/CT scan on 03/24/2017 showed no evidence of hypermetabolic soft tissue meta stasis, with no significant change in the hypermetabolic bone metastasis, her index lesion being right glenoid.  2.  Bone metastasis: We have switched her Zometa to once every 3 months.  She will receive her dose today.  She will continue calcium and vitamin D supplements.

## 2017-09-20 ENCOUNTER — Emergency Department (HOSPITAL_COMMUNITY)
Admission: EM | Admit: 2017-09-20 | Discharge: 2017-09-21 | Disposition: A | Discharge status: Home / Self Care | Payer: Medicare Other | Attending: Emergency Medicine | Admitting: Emergency Medicine

## 2017-09-20 ENCOUNTER — Other Ambulatory Visit (HOSPITAL_COMMUNITY): Payer: Self-pay | Admitting: Hematology

## 2017-09-20 ENCOUNTER — Emergency Department (HOSPITAL_COMMUNITY): Payer: Medicare Other

## 2017-09-20 ENCOUNTER — Other Ambulatory Visit (HOSPITAL_COMMUNITY): Payer: Self-pay | Admitting: Emergency Medicine

## 2017-09-20 ENCOUNTER — Other Ambulatory Visit: Payer: Self-pay

## 2017-09-20 DIAGNOSIS — R197 Diarrhea, unspecified: Secondary | ICD-10-CM | POA: Insufficient documentation

## 2017-09-20 DIAGNOSIS — E860 Dehydration: Secondary | ICD-10-CM | POA: Insufficient documentation

## 2017-09-20 DIAGNOSIS — Z87891 Personal history of nicotine dependence: Secondary | ICD-10-CM | POA: Insufficient documentation

## 2017-09-20 DIAGNOSIS — R1084 Generalized abdominal pain: Secondary | ICD-10-CM | POA: Insufficient documentation

## 2017-09-20 DIAGNOSIS — Z79899 Other long term (current) drug therapy: Secondary | ICD-10-CM | POA: Diagnosis not present

## 2017-09-20 DIAGNOSIS — R112 Nausea with vomiting, unspecified: Secondary | ICD-10-CM

## 2017-09-20 DIAGNOSIS — E876 Hypokalemia: Secondary | ICD-10-CM | POA: Insufficient documentation

## 2017-09-20 DIAGNOSIS — R079 Chest pain, unspecified: Secondary | ICD-10-CM | POA: Diagnosis not present

## 2017-09-20 DIAGNOSIS — D0590 Unspecified type of carcinoma in situ of unspecified breast: Secondary | ICD-10-CM | POA: Insufficient documentation

## 2017-09-20 DIAGNOSIS — Z7901 Long term (current) use of anticoagulants: Secondary | ICD-10-CM | POA: Diagnosis not present

## 2017-09-20 DIAGNOSIS — R1110 Vomiting, unspecified: Secondary | ICD-10-CM | POA: Diagnosis not present

## 2017-09-20 LAB — CBC WITH DIFFERENTIAL/PLATELET
BASOS ABS: 0 10*3/uL (ref 0.0–0.1)
BASOS PCT: 0 %
Eosinophils Absolute: 0 10*3/uL (ref 0.0–0.7)
Eosinophils Relative: 0 %
HCT: 39.2 % (ref 36.0–46.0)
Hemoglobin: 12.4 g/dL (ref 12.0–15.0)
Lymphocytes Relative: 3 %
Lymphs Abs: 0.3 10*3/uL — ABNORMAL LOW (ref 0.7–4.0)
MCH: 26.1 pg (ref 26.0–34.0)
MCHC: 31.6 g/dL (ref 30.0–36.0)
MCV: 82.4 fL (ref 78.0–100.0)
MONO ABS: 0.3 10*3/uL (ref 0.1–1.0)
Monocytes Relative: 2 %
NEUTROS ABS: 11.6 10*3/uL — AB (ref 1.7–7.7)
Neutrophils Relative %: 95 %
Platelets: 292 10*3/uL (ref 150–400)
RBC: 4.76 MIL/uL (ref 3.87–5.11)
RDW: 16.5 % — AB (ref 11.5–15.5)
WBC: 12.2 10*3/uL — AB (ref 4.0–10.5)

## 2017-09-20 LAB — COMPREHENSIVE METABOLIC PANEL
ALBUMIN: 4.2 g/dL (ref 3.5–5.0)
ALK PHOS: 121 U/L (ref 38–126)
ALT: 21 U/L (ref 14–54)
ANION GAP: 12 (ref 5–15)
AST: 26 U/L (ref 15–41)
BILIRUBIN TOTAL: 0.6 mg/dL (ref 0.3–1.2)
BUN: 15 mg/dL (ref 6–20)
CALCIUM: 9.6 mg/dL (ref 8.9–10.3)
CO2: 24 mmol/L (ref 22–32)
Chloride: 99 mmol/L — ABNORMAL LOW (ref 101–111)
Creatinine, Ser: 1.13 mg/dL — ABNORMAL HIGH (ref 0.44–1.00)
GFR calc Af Amer: >60 mL/min (ref >60)
GFR, EST NON AFRICAN AMERICAN: 54 mL/min — AB (ref >60)
GLUCOSE: 136 mg/dL — AB (ref 65–99)
Potassium: 3.4 mmol/L — ABNORMAL LOW (ref 3.5–5.1)
Sodium: 135 mmol/L (ref 135–145)
TOTAL PROTEIN: 8.4 g/dL — AB (ref 6.5–8.1)

## 2017-09-20 MED ORDER — MORPHINE SULFATE (PF) 2 MG/ML IV SOLN
INTRAVENOUS | Status: AC
  Administered 2017-09-20: 4 mg via INTRAVENOUS
  Filled 2017-09-20: qty 2

## 2017-09-20 MED ORDER — LAPATINIB DITOSYLATE 250 MG PO TABS: 1000.0000 mg | ORAL_TABLET | Freq: Every day | ORAL | 0 refills | Status: DC

## 2017-09-20 MED ORDER — SODIUM CHLORIDE 0.9 % IV BOLUS
2000.0000 mL | Freq: Once | INTRAVENOUS | Status: AC
  Administered 2017-09-20: 2000 mL via INTRAVENOUS

## 2017-09-20 MED ORDER — MORPHINE SULFATE (PF) 2 MG/ML IV SOLN
INTRAVENOUS | Status: AC
  Administered 2017-09-20: 2 mg via INTRAVENOUS
  Filled 2017-09-20: qty 1

## 2017-09-20 MED ORDER — MORPHINE SULFATE (PF) 4 MG/ML IV SOLN: 6.0000 mg | Freq: Once | INTRAVENOUS | Status: DC

## 2017-09-20 NOTE — ED Provider Notes (Signed)
[facility] EMERGENCY DEPARTMENT Provider Note   CSN: [id number] Arrival date & time: 09/20/17  2007     History   Chief Complaint Chief Complaint  Patient presents with  . Emesis    HPI [name] is a 52 y.o. female.  HPI Complains of vomiting and diarrhea onset this morning.  She reports approximately 20 episodes of diarrhea 2 episodes of vomiting she also reports diffuse abdominal pain and leg cramps.  Other associated symptoms include lightheadedness, worse with standing maximum temperature between 99 and 100 degrees.  No cough.  No treatment prior to coming here.  No other associated symptoms.  No hematemesis no blood per rectum.  No recent travel or antibiotic use she is not nauseated presently Past Medical History:  Diagnosis Date  . Anticoagulated    xarelto  . Anxiety   . Breast cancer metastasized to multiple sites [facility])    liver, brain, and bone  . Breast cancer, stage 4 [facility]) oncologist-  dr Larene [place] ([facility])   dx 12/ 2015 -- breast cancer Stage 4,  ER/HER2 +,  w/  liver, brain and  bone mets/  chemotherapy and radiation therapy  . Chronic pain syndrome    secondary to cancer   . Depression   . Drug-induced cardiomyopathy ([place])    per last MUGA (08-08-2015), ef 56.5/ per last echo 05-18-2014 ef 60-65%  . Family history of prostate cancer   . GERD (gastroesophageal reflux disease)   . History of colon polyps    07-13-2013  benign  . History of DVT (deep vein thrombosis)    07-09-2014  upper right extremity-  RIJ and right subclavian--  resolved  . History of gastritis    erosive  . History of pneumonia    HCAP 06-07-2015--  resolved per cxr 07-04-2015  . History of radiation therapy    12 fractions to L3 - S3, 30Gy and left spacula 20Gy (06-20-2014 to 07-10-2014) //  whole brain rxt (10-09-2014 to 10-26-2014)  . History of small bowel obstruction    S/P RESECTION 2008  . Migraine   . PONV (postoperative nausea and vomiting)    pt  states scope patch does well    Patient Active Problem List   Diagnosis Date Noted  . Mouth sores 03/18/2016  . Esophageal reflux   . Metastatic breast cancer ([place])   . HCAP (healthcare-associated pneumonia) 06/02/2015  . Pneumonia 06/02/2015  . Drug-induced cardiomyopathy ([place]) 02/15/2015  . Brain metastases ([place]) 01/29/2015  . Genetic testing 09/05/2014  . Family history of prostate cancer   . GERD (gastroesophageal reflux disease) 07/16/2014  . DVT (deep venous thrombosis) ([place]) 07/09/2014  . Bone metastases ([place]) 05/23/2014  . Breast cancer, stage 4 ([place]) 05/16/2014  . Hyperlipidemia 04/23/2014  . Depression 04/23/2014    Past Surgical History:  Procedure Laterality Date  . BREAST REDUCTION SURGERY  03/17/2011   Procedure: MAMMARY REDUCTION BILATERAL (BREAST);  Surgeon: Mary A Contogiannis;  Location: [place];  Service: Plastics;  Laterality: Bilateral;  . CATARACT EXTRACTION W/ INTRAOCULAR LENS  IMPLANT, BILATERAL  2008  . CERVICAL FUSION  2003   C5 -- C6  . COLONOSCOPY N/A 07/13/2013   Procedure: COLONOSCOPY;  Surgeon: Rogene Houston, MD;  [facility];  Service: Endoscopy;  Laterality: N/A;  930  . COLONOSCOPY N/A 11/26/2014   Procedure: COLONOSCOPY;  Surgeon: Rogene Houston, MD;  [facility];  Service: Endoscopy;  Laterality: N/A;  730  .  D & C HYSTEROSCOPY/  RESECTION ENDOMETRIAL MASS/  Luis M. Cintron ENDOMETRIAL ABLATION  04-11-2010  . DX LAPAROSCOPY W/ PARTIAL SMALL BOWEL RESECTION AND APPENDECTOMY  04-13-2007  . ESOPHAGOGASTRODUODENOSCOPY N/A 05/25/2014   Procedure: ESOPHAGOGASTRODUODENOSCOPY (EGD);  Surgeon: Rogene Houston, MD;  [facility];  Service: Endoscopy;  Laterality: N/A;  155  . KNEE ARTHROSCOPY Right 2005  . LAPAROSCOPIC ASSISTED VAGINAL HYSTERECTOMY  10-13-2010   w/ Bx Left Fallopian tube and Aspiration Right Ovarian Cyst  . LAPAROSCOPIC CHOLECYSTECTOMY  11-17-2002  . LAPAROSCOPIC SALPINGO OOPHERECTOMY  Bilateral 08/26/2015   Procedure: LAPAROSCOPIC SALPINGO OOPHORECTOMY, bilateral;  Surgeon: Everlene Farrier, MD;  Location: [place];  Service: Gynecology;  Laterality: Bilateral;  . PORTACATH PLACEMENT  05-17-2014  . REDUCTION MAMMAPLASTY    . SHOULDER ARTHROSCOPY WITH ROTATOR CUFF REPAIR Right 2002  . TRANSTHORACIC ECHOCARDIOGRAM  05-18-2014   ef 60-65%//   last MUGA  (08-08-2015)  ef 56.6%       OB History   None      Home Medications    Prior to Admission medications   Medication Sig Start Date End Date Taking? Authorizing Provider  acetaminophen (TYLENOL) 325 MG tablet Take 650 mg by mouth every 6 (six) hours as needed for moderate pain or headache.   Yes [provider]  calcium carbonate (TUMS - DOSED IN MG ELEMENTAL CALCIUM) 500 MG chewable tablet Chew 2 tablets by mouth daily.    Yes [provider]  Calcium-Phosphorus-Vitamin D (CALCIUM/D3 ADULT GUMMIES PO) Take 2 tablets by mouth 2 (two) times daily. dosage of calcium 1229m and vitamin d 10080m  Yes [provider]  desvenlafaxine (PRISTIQ) 100 MG 24 hr tablet Take 1 tablet (100 mg total) by mouth daily. 04/28/17  Yes Eksir, AlRichard MiuMD  diphenhydrAMINE (BENADRYL) 25 mg capsule Take 25 mg by mouth at bedtime as needed for itching.    Yes [provider]  gabapentin (NEURONTIN) 300 MG capsule Take 2 capsules (600 mg total) by mouth at bedtime. 02/18/17  Yes DaHolley BoucheNP  lapatinib (TYKERB) 250 MG tablet Take 4 tablets (1,000 mg total) by mouth daily. 09/20/17  Yes KaDerek JackMD  lidocaine-prilocaine (EMLA) cream Apply 1 application topically daily as needed (apply to port before chemo). 04/28/17  Yes DaHolley BoucheNP  LORazepam (ATIVAN) 0.5 MG tablet TAKE ONE TABLET BY MOUTH EVERY 6 HOURS AS NEEDED FOR ANXIETY 08/10/17  Yes DaHolley BoucheNP  Melatonin 10 MG CAPS Take 1 tablet by mouth at bedtime. Pt states she takes one tablet at night for  sleep    Yes [provider]  methylphenidate (RITALIN) 20 MG tablet Take 20 mg by mouth daily.   Yes [provider]  morphine (MS CONTIN) 30 MG 12 hr tablet TAKE 1 TABLET BY MOUTH EVERY TWELVE HOURS 09/03/17  Yes DaHolley BoucheNP  ondansetron (ZOFRAN) 8 MG tablet Take 1 tablet (8 mg total) by mouth every 8 (eight) hours as needed for nausea or vomiting. 12/24/16  Yes DaHolley BoucheNP  pantoprazole (PROTONIX) 40 MG tablet TAKE ONE TABLET BY MOUTH EVERY TWELVE HOURS 07/04/17  Yes DaHolley BoucheNP  promethazine (PHENERGAN) 25 MG tablet TAKE 1 TABLET BY MOUTH EVERY SIX HOURS AS NEEDED FOR NAUSEA OR VOMITING 12/23/16  Yes DaHolley BoucheNP  rivaroxaban (XARELTO) 20 MG TABS tablet TAKE 1 TABLET BY MOUTH EVERY DAY WITH SUPPER 02/18/17  Yes DaHolley BoucheNP  spironolactone (ALDACTONE) 25  MG tablet Take 1 tablet (25 mg total) by mouth daily. 05/26/17  Yes Holley Bouche, NP  Zoledronic Acid (ZOMETA IV) Inject into the vein every 3 (three) months.   Yes [provider]  zolpidem (AMBIEN) 10 MG tablet Take 1 tablet (10 mg total) by mouth at bedtime as needed. Patient taking differently: Take 10 mg by mouth at bedtime as needed for sleep.  09/09/17  Yes Holley Bouche, NP  NARCAN 4 MG/0.1ML LIQD nasal spray kit CALL 911. ADMINISTER A SINGLE SPRAY OF NARCAN IN ONE NOSTRIL, REPEAT EVERY 3 MINUTES AS NEEDED IF NO OR MINIMAL RESPONSE 04/06/17   [provider]  oxyCODONE (OXY IR/ROXICODONE) 5 MG immediate release tablet Take 1-2 tablets (5-10 mg total) by mouth every 4 (four) hours as needed for moderate pain. 08/10/16   Baird Cancer, PA-C    Family History Family History  Problem Relation Age of Onset  . Diabetes Father   . Heart attack Maternal Grandmother 30       multiple over lifetime.  . Cancer Maternal Grandmother 14       NOS  . Prostate cancer Maternal Grandfather        dx in his 78s  . Lung cancer Paternal Grandfather         dx <50  . Lymphoma Maternal Aunt        dx in her 21s  . Melanoma Cousin 1       maternal first cousin  . Brain cancer Cousin        paternal first cousin dx under 35  . Prostate cancer Other        MGF's father  . Colon cancer Other        MGM's mother    Social History Social History   Tobacco Use  . Smoking status: Former Smoker    Types: Cigarettes    Last attempt to quit: 08/20/1994    Years since quitting: 23.1  . Smokeless tobacco: Never Used  Substance Use Topics  . Alcohol use: Yes    Comment: Occasionally  . Drug use: No     Allergies   Patient has no known allergies.   Review of Systems Review of Systems  Constitutional: Negative.   HENT: Negative.   Respiratory: Negative.   Cardiovascular: Negative.   Gastrointestinal: Positive for abdominal pain, diarrhea and vomiting.  Musculoskeletal: Positive for myalgias.       Leg cramps  Skin: Negative.   Allergic/Immunologic: Positive for immunocompromised state.       Cancer patient on chemotherapy  Neurological: Positive for light-headedness.  Psychiatric/Behavioral: Negative.   All other systems reviewed and are negative.    Physical Exam Updated Vital Signs BP 105/84 (BP Location: Left Arm)   Pulse 96   Temp 99.4 F (37.4 C) (Oral)   Resp 20   Ht '5\' 8"'  (1.727 m)   Wt 118.4 kg (261 lb)   SpO2 95%   BMI 39.68 kg/m   Physical Exam  Constitutional: She is oriented to person, place, and time. She appears well-developed and well-nourished. No distress.  HENT:  Head: Normocephalic and atraumatic.  Mucous membranes dry  Eyes: Pupils are equal, round, and reactive to light. Conjunctivae are normal.  Neck: Neck supple. No tracheal deviation present. No thyromegaly present.  Cardiovascular: Normal rate and regular rhythm.  No murmur heard. Pulmonary/Chest: Effort normal and breath sounds normal.  Abdominal: Soft. Bowel sounds are normal. She exhibits no distension. There is no  tenderness.  Obese  mild diffuse tenderness  Musculoskeletal: Normal range of motion. She exhibits no edema or tenderness.  Neurological: She is alert and oriented to person, place, and time. Coordination normal.  Skin: Skin is warm and dry. No rash noted.  Psychiatric: She has a normal mood and affect.  Nursing note and vitals reviewed.    ED Treatments / Results  Labs (all labs ordered are listed, but only abnormal results are displayed) Labs Reviewed  COMPREHENSIVE METABOLIC PANEL  CBC WITH DIFFERENTIAL/PLATELET    EKG None  Radiology No results found.  Procedures Procedures (including critical care time)  Medications Ordered in ED Medications  morphine 4 MG/ML injection 6 mg (has no administration in time range)  sodium chloride 0.9 % bolus 2,000 mL (has no administration in time range)   Results for orders placed or performed during the hospital encounter of 09/20/17  Comprehensive metabolic panel  Result Value Ref Range   Sodium 135 135 - 145 mmol/L   Potassium 3.4 (L) 3.5 - 5.1 mmol/L   Chloride 99 (L) 101 - 111 mmol/L   CO2 24 22 - 32 mmol/L   Glucose, Bld 136 (H) 65 - 99 mg/dL   BUN 15 6 - 20 mg/dL   Creatinine, Ser 1.13 (H) 0.44 - 1.00 mg/dL   Calcium 9.6 8.9 - 10.3 mg/dL   Total Protein 8.4 (H) 6.5 - 8.1 g/dL   Albumin 4.2 3.5 - 5.0 g/dL   AST 26 15 - 41 U/L   ALT 21 14 - 54 U/L   Alkaline Phosphatase 121 38 - 126 U/L   Total Bilirubin 0.6 0.3 - 1.2 mg/dL   GFR calc non Af Amer 54 (L) >60 mL/min   GFR calc Af Amer >60 >60 mL/min   Anion gap 12 5 - 15  CBC with Differential/Platelet  Result Value Ref Range   WBC 12.2 (H) 4.0 - 10.5 K/uL   RBC 4.76 3.87 - 5.11 MIL/uL   Hemoglobin 12.4 12.0 - 15.0 g/dL   HCT 39.2 36.0 - 46.0 %   MCV 82.4 78.0 - 100.0 fL   MCH 26.1 26.0 - 34.0 pg   MCHC 31.6 30.0 - 36.0 g/dL   RDW 16.5 (H) 11.5 - 15.5 %   Platelets 292 150 - 400 K/uL   Neutrophils Relative % 95 %   Neutro Abs 11.6 (H) 1.7 - 7.7 K/uL   Lymphocytes Relative 3 %    Lymphs Abs 0.3 (L) 0.7 - 4.0 K/uL   Monocytes Relative 2 %   Monocytes Absolute 0.3 0.1 - 1.0 K/uL   Eosinophils Relative 0 %   Eosinophils Absolute 0.0 0.0 - 0.7 K/uL   Basophils Relative 0 %   Basophils Absolute 0.0 0.0 - 0.1 K/uL   Dg Abd Acute W/chest  Result Date: 09/20/2017 CLINICAL DATA:  52 y/o F; nausea, vomiting, and diarrhea. Chemo 3 days ago. History of stage IV breast cancer. EXAM: DG ABDOMEN ACUTE W/ 1V CHEST COMPARISON:  03/24/2017 PET-CT.  09/28/2016 chest radiograph. FINDINGS: There is no evidence of dilated bowel loops or free intraperitoneal air. No radiopaque calculi or other significant radiographic abnormality is seen. Heart size and mediastinal contours are within normal limits. Both lungs are clear. Anterior cervical discectomy fusion hardware partially visualized. Right upper quadrant surgical clips. Right port catheter tip projects over the mid SVC. Left T10 posterior chronic rib fracture. IMPRESSION: Negative abdominal radiographs.  No acute cardiopulmonary disease. Electronically Signed   By: Edgardo Roys.D.  On: 09/20/2017 23:31    Initial Impression / Assessment and Plan / ED Course  I have reviewed the triage vital signs and the nursing notes.  Pertinent labs & imaging results that were available during my care of the patient were reviewed by me and considered in my medical decision making (see chart for details).     12:15 AM patient's pain minimally better after treatment with intravenous morphine.  And IV hydration with normal saline additional dose of IV morphine 6 mill grams ordered by me.Pt signed out to Dr. Eliane Decree at 12:45 AM She received oral potassium while here.  Lab work consistent with hypokalemia Final diagnoses:  None  Diagnosis #1 generalized abdominal pain #2 vomiting and diarrhea #3 hypokalemia  ED Discharge Orders    None       Orlie Dakin, MD 09/21/17 [phone]

## 2017-09-20 NOTE — Progress Notes (Signed)
tykerb refilled

## 2017-09-20 NOTE — ED Triage Notes (Signed)
Pt brought in by [facility] for c/o n/v/d that started today; pt had chemo last x 3 days ago; pt given 252ml en route by ems. zofran given by ems

## 2017-09-21 ENCOUNTER — Emergency Department (HOSPITAL_COMMUNITY): Payer: Medicare Other

## 2017-09-21 DIAGNOSIS — R1110 Vomiting, unspecified: Secondary | ICD-10-CM | POA: Diagnosis not present

## 2017-09-21 DIAGNOSIS — R197 Diarrhea, unspecified: Secondary | ICD-10-CM | POA: Diagnosis not present

## 2017-09-21 DIAGNOSIS — R1084 Generalized abdominal pain: Secondary | ICD-10-CM | POA: Diagnosis not present

## 2017-09-21 MED ORDER — SODIUM CHLORIDE 0.9 % IV BOLUS
1000.0000 mL | Freq: Once | INTRAVENOUS | Status: AC
  Administered 2017-09-21: 1000 mL via INTRAVENOUS

## 2017-09-21 MED ORDER — DICYCLOMINE HCL 10 MG/ML IM SOLN
20.0000 mg | Freq: Once | INTRAMUSCULAR | Status: AC
  Administered 2017-09-21: 20 mg via INTRAMUSCULAR
  Filled 2017-09-21: qty 2

## 2017-09-21 MED ORDER — POTASSIUM CHLORIDE CRYS ER 20 MEQ PO TBCR
40.0000 meq | EXTENDED_RELEASE_TABLET | Freq: Once | ORAL | Status: AC
  Administered 2017-09-21: 40 meq via ORAL
  Filled 2017-09-21: qty 2

## 2017-09-21 MED ORDER — METOCLOPRAMIDE HCL 5 MG/ML IJ SOLN
10.0000 mg | Freq: Once | INTRAMUSCULAR | Status: AC
  Administered 2017-09-21: 10 mg via INTRAVENOUS
  Filled 2017-09-21: qty 2

## 2017-09-21 MED ORDER — LOPERAMIDE HCL 2 MG PO CAPS
2.0000 mg | ORAL_CAPSULE | Freq: Once | ORAL | Status: AC
  Administered 2017-09-21: 2 mg via ORAL
  Filled 2017-09-21: qty 1

## 2017-09-21 MED ORDER — ONDANSETRON HCL 4 MG PO TABS: 4.0000 mg | ORAL_TABLET | Freq: Three times a day (TID) | ORAL | 0 refills | Status: DC | PRN

## 2017-09-21 MED ORDER — DICYCLOMINE HCL 20 MG PO TABS: 20.0000 mg | ORAL_TABLET | Freq: Three times a day (TID) | ORAL | 0 refills | Status: DC

## 2017-09-21 MED ORDER — MORPHINE SULFATE (PF) 4 MG/ML IV SOLN: 6.0000 mg | Freq: Once | INTRAVENOUS | Status: DC

## 2017-09-21 MED ORDER — IOPAMIDOL (ISOVUE-300) INJECTION 61%
100.0000 mL | Freq: Once | INTRAVENOUS | Status: AC | PRN
  Administered 2017-09-21: 100 mL via INTRAVENOUS

## 2017-09-21 MED ORDER — MORPHINE SULFATE (PF) 2 MG/ML IV SOLN
INTRAVENOUS | Status: AC
  Administered 2017-09-21: 6 mg via INTRAVENOUS
  Filled 2017-09-21: qty 3

## 2017-09-21 MED ORDER — DIPHENHYDRAMINE HCL 50 MG/ML IJ SOLN
25.0000 mg | Freq: Once | INTRAMUSCULAR | Status: AC
  Administered 2017-09-21: 25 mg via INTRAVENOUS
  Filled 2017-09-21: qty 1

## 2017-09-21 NOTE — Discharge Instructions (Addendum)
Drink plenty of fluids (clear liquids) then start a bland diet later this morning such as toast, crackers, jello, [employer] chicken noodle soup. Use the zofran for nausea or vomiting. Take imodium OTC for diarrhea. Avoid milk products until the diarrhea is gone. Take the bentyl for your abdominal cramping. Recheck if you get dehydrated again.

## 2017-09-21 NOTE — ED Provider Notes (Signed)
Patient was left at change of shift to get results of her abdomen/pelvis CT scan.  Patient presented with episodes of vomiting and diarrhea with diffuse abdominal pain.  She has a history of metastatic breast cancer, stage IV.  03:12 AM when I go into see patient she states she still has abdominal discomfort, she states she still having diarrhea.  She complains of a headache.  She was given another liter of IV fluids, she was given Bentyl IM for her complaints of abdominal discomfort and given oral Imodium.  She states she had taken it at home but has not gotten any in the ED.  We discussed her test results which were reassuring, her metastatic disease appears to be improving as far as her liver mets, she still has the bony mets.  However there also is no evidence of colitis, diverticulitis etc.  Patient is bitterly complaining about the stretcher causing pain.  Recheck at 5:20 AM patient states she is feeling better although she feels like she may need to have diarrhea soon.  She is having urinary output, she states her tongue no longer feels dry.  Her intestinal pain is better.  She feels ready to go home.   Ct Abdomen Pelvis W Contrast  Result Date: 09/21/2017 CLINICAL DATA:  Acute onset of vomiting and diarrhea. Generalized abdominal pain and leg cramping. Lightheadedness. EXAM: CT ABDOMEN AND PELVIS WITH CONTRAST TECHNIQUE: Multidetector CT imaging of the abdomen and pelvis was performed using the standard protocol following bolus administration of intravenous contrast. CONTRAST:  165mL ISOVUE-300 IOPAMIDOL (ISOVUE-300) INJECTION 61% COMPARISON:  CT of the abdomen and pelvis performed 07/08/2014, and PET/CT performed 03/24/2017 FINDINGS: Lower chest: The visualized lung bases are grossly clear. The visualized portions of the mediastinum are unremarkable. Hepatobiliary: Most of the previously noted hepatic metastases are no longer definitely seen. A 3.3 cm mass at the posterior aspect of the right  hepatic lobe is mildly decreased in size. Underlying hepatic nodularity is again noted. The patient is status post cholecystectomy. No retained stones are identified. Pancreas: There is diffuse fatty infiltration of the head of the pancreas. The pancreas is otherwise grossly unremarkable. Spleen: The spleen is unremarkable in appearance. Adrenals/Urinary Tract: The adrenal glands are unremarkable in appearance. The kidneys are within normal limits. There is no evidence of hydronephrosis. No renal or ureteral stones are identified. No perinephric stranding is seen. Stomach/Bowel: The stomach is unremarkable in appearance. An ileal bowel suture line is grossly unremarkable in appearance. The small bowel is within normal limits. The appendix is not visualized; there is no evidence for appendicitis. The colon is unremarkable in appearance. Vascular/Lymphatic: The abdominal aorta is unremarkable in appearance. The inferior vena cava is grossly unremarkable. No retroperitoneal lymphadenopathy is seen. No pelvic sidewall lymphadenopathy is identified. Reproductive: The bladder is mildly distended and grossly unremarkable. The patient is status post hysterectomy. No suspicious adnexal masses are seen. Other: No additional soft tissue abnormalities are seen. Musculoskeletal: Scattered metastatic blastic lesions are again noted throughout the visualized thoracic and lumbar spine. This is largely stable, aside from new diffuse sclerosis involving much of vertebral body T10. The visualized musculature is unremarkable in appearance. IMPRESSION: 1. No acute abnormality seen to explain the patient's symptoms. 2. Most of the previously noted hepatic metastases are no longer definitely seen. 3.3 cm mass at the posterior aspect of the right hepatic lobe is mildly decreased in size. Underlying hepatic nodularity again noted. 3. Scattered metastatic lesions again noted throughout the visualized thoracic and lumbar spine,  largely  stable, aside from new diffuse sclerosis involving much of vertebral body T10. Electronically Signed   By: Garald Balding M.D.   On: 09/21/2017 01:35   Diagnoses that have been ruled out:  None  Diagnoses that are still under consideration:  None  Final diagnoses:  Generalized abdominal pain  Nausea vomiting and diarrhea  Dehydration   New Prescriptions   DICYCLOMINE (BENTYL) 20 MG TABLET    Take 1 tablet (20 mg total) by mouth 4 (four) times daily -  before meals and at bedtime.   ONDANSETRON (ZOFRAN) 4 MG TABLET    Take 1 tablet (4 mg total) by mouth every 8 (eight) hours as needed for nausea or vomiting.    Plan discharge  Rolland Porter, MD, Barbette Or, MD 09/21/17 [phone]

## 2017-09-27 ENCOUNTER — Other Ambulatory Visit (HOSPITAL_COMMUNITY): Payer: Self-pay | Admitting: Emergency Medicine

## 2017-09-27 MED ORDER — DIPHENOXYLATE-ATROPINE 2.5-0.025 MG PO TABS: 2.0000 | ORAL_TABLET | Freq: Four times a day (QID) | ORAL | 1 refills | Status: DC | PRN

## 2017-09-27 NOTE — Progress Notes (Signed)
Thayer Jew called and stated that they were in the ER about a week ago with dehydration and diarrhea and vomited.  Now she has started having the diarrhea again.  I asked him what she was taking and he said the two mediations that the ER given her which was Zofran and bentyl.  I explained that she could still take those but I wanted her to take imodium every 2 hours and I was going to call in lomotil and take that as prescribed also,.  To call in 24 hours if diarrhea has not improved and we could get her in for hydration.  He verbalized understanding.

## 2017-09-29 ENCOUNTER — Other Ambulatory Visit (HOSPITAL_COMMUNITY): Payer: Self-pay | Admitting: Adult Health

## 2017-09-29 DIAGNOSIS — C7951 Secondary malignant neoplasm of bone: Secondary | ICD-10-CM

## 2017-09-29 DIAGNOSIS — R600 Localized edema: Secondary | ICD-10-CM

## 2017-09-29 DIAGNOSIS — C229 Malignant neoplasm of liver, not specified as primary or secondary: Secondary | ICD-10-CM

## 2017-09-29 DIAGNOSIS — C50919 Malignant neoplasm of unspecified site of unspecified female breast: Secondary | ICD-10-CM

## 2017-09-30 ENCOUNTER — Other Ambulatory Visit (HOSPITAL_COMMUNITY): Payer: Self-pay | Admitting: Emergency Medicine

## 2017-09-30 ENCOUNTER — Telehealth (HOSPITAL_COMMUNITY): Payer: Self-pay | Admitting: Emergency Medicine

## 2017-09-30 NOTE — Telephone Encounter (Signed)
Thayer Jew called and stated that pt still is not feeling any better and wants to come in for fluids.  We do not have appts available in the clinic today or Friday but short stay could accomodate her Friday 10/01/2017 at 11:00 am.  Thayer Jew verbalized understanding.  Spoke with Mike Craze NP and discussed what has been going on from ER visit to call on Monday until now.  We have added an MD appt to pts next herceptin visit so the pt can be evaluated to see if maybe she needs to have a GI consult.

## 2017-09-30 NOTE — Progress Notes (Signed)
Orders placed for normal saline over 2 hours placed in signed and held for short stay to access for pt appt on 10/01/2017.

## 2017-10-01 ENCOUNTER — Encounter (HOSPITAL_COMMUNITY)
Admission: RE | Admit: 2017-10-01 | Discharge: 2017-10-01 | Disposition: A | Discharge status: Home / Self Care | Payer: Medicare Other | Source: Ambulatory Visit | Attending: Hematology | Admitting: Hematology

## 2017-10-01 ENCOUNTER — Encounter (HOSPITAL_COMMUNITY): Payer: Self-pay

## 2017-10-01 ENCOUNTER — Ambulatory Visit (HOSPITAL_COMMUNITY)
Admission: RE | Admit: 2017-10-01 | Discharge: 2017-10-01 | Disposition: A | Discharge status: Home / Self Care | Payer: Medicare Other | Source: Ambulatory Visit | Attending: Hematology | Admitting: Hematology

## 2017-10-01 DIAGNOSIS — C7931 Secondary malignant neoplasm of brain: Secondary | ICD-10-CM | POA: Insufficient documentation

## 2017-10-01 DIAGNOSIS — I078 Other rheumatic tricuspid valve diseases: Secondary | ICD-10-CM | POA: Insufficient documentation

## 2017-10-01 DIAGNOSIS — Z86718 Personal history of other venous thrombosis and embolism: Secondary | ICD-10-CM | POA: Diagnosis not present

## 2017-10-01 DIAGNOSIS — C50919 Malignant neoplasm of unspecified site of unspecified female breast: Secondary | ICD-10-CM | POA: Insufficient documentation

## 2017-10-01 DIAGNOSIS — K219 Gastro-esophageal reflux disease without esophagitis: Secondary | ICD-10-CM | POA: Diagnosis not present

## 2017-10-01 DIAGNOSIS — E785 Hyperlipidemia, unspecified: Secondary | ICD-10-CM | POA: Insufficient documentation

## 2017-10-01 MED ORDER — HEPARIN SOD (PORK) LOCK FLUSH 100 UNIT/ML IV SOLN
INTRAVENOUS | Status: AC
  Filled 2017-10-01: qty 5

## 2017-10-01 MED ORDER — SODIUM CHLORIDE 0.9 % IV SOLN
INTRAVENOUS | Status: DC
  Administered 2017-10-01: 1000 mL via INTRAVENOUS

## 2017-10-01 MED ORDER — HEPARIN SOD (PORK) LOCK FLUSH 100 UNIT/ML IV SOLN
500.0000 [IU] | INTRAVENOUS | Status: AC | PRN
  Administered 2017-10-01: 500 [IU]

## 2017-10-01 NOTE — Progress Notes (Signed)
*  PRELIMINARY RESULTS* Echocardiogram 2D Echocardiogram has been performed.  Samuel Germany 10/01/2017, 10:31 AM

## 2017-10-05 ENCOUNTER — Other Ambulatory Visit (HOSPITAL_COMMUNITY): Payer: Self-pay | Admitting: Adult Health

## 2017-10-05 DIAGNOSIS — R5382 Chronic fatigue, unspecified: Secondary | ICD-10-CM

## 2017-10-05 DIAGNOSIS — C50919 Malignant neoplasm of unspecified site of unspecified female breast: Secondary | ICD-10-CM

## 2017-10-05 DIAGNOSIS — C7951 Secondary malignant neoplasm of bone: Secondary | ICD-10-CM

## 2017-10-05 DIAGNOSIS — C229 Malignant neoplasm of liver, not specified as primary or secondary: Secondary | ICD-10-CM

## 2017-10-05 DIAGNOSIS — Z0000 Encounter for general adult medical examination without abnormal findings: Secondary | ICD-10-CM

## 2017-10-05 NOTE — Telephone Encounter (Signed)
Received refill request from patients pharmacy for Morphine Er 30mg  tab. Reviewed with provider, chart checked and refilled.

## 2017-10-07 DIAGNOSIS — H43813 Vitreous degeneration, bilateral: Secondary | ICD-10-CM | POA: Diagnosis not present

## 2017-10-07 DIAGNOSIS — H34831 Tributary (branch) retinal vein occlusion, right eye, with macular edema: Secondary | ICD-10-CM | POA: Diagnosis not present

## 2017-10-07 DIAGNOSIS — H3581 Retinal edema: Secondary | ICD-10-CM | POA: Diagnosis not present

## 2017-10-08 ENCOUNTER — Other Ambulatory Visit: Payer: Self-pay

## 2017-10-08 ENCOUNTER — Inpatient Hospital Stay (HOSPITAL_BASED_OUTPATIENT_CLINIC_OR_DEPARTMENT_OTHER): Payer: Medicare Other | Admitting: Hematology

## 2017-10-08 ENCOUNTER — Encounter (HOSPITAL_COMMUNITY): Payer: Self-pay | Admitting: Hematology

## 2017-10-08 ENCOUNTER — Encounter (HOSPITAL_COMMUNITY): Payer: Self-pay

## 2017-10-08 ENCOUNTER — Inpatient Hospital Stay (HOSPITAL_COMMUNITY): Payer: Medicare Other

## 2017-10-08 VITALS — BP 113/47 | HR 72 | Temp 97.9°F | Resp 18 | Wt 261.0 lb

## 2017-10-08 DIAGNOSIS — E876 Hypokalemia: Secondary | ICD-10-CM

## 2017-10-08 DIAGNOSIS — Z5112 Encounter for antineoplastic immunotherapy: Secondary | ICD-10-CM | POA: Diagnosis not present

## 2017-10-08 DIAGNOSIS — Z923 Personal history of irradiation: Secondary | ICD-10-CM | POA: Diagnosis not present

## 2017-10-08 DIAGNOSIS — C7951 Secondary malignant neoplasm of bone: Secondary | ICD-10-CM | POA: Diagnosis not present

## 2017-10-08 DIAGNOSIS — C7931 Secondary malignant neoplasm of brain: Secondary | ICD-10-CM

## 2017-10-08 DIAGNOSIS — C50919 Malignant neoplasm of unspecified site of unspecified female breast: Secondary | ICD-10-CM | POA: Diagnosis not present

## 2017-10-08 DIAGNOSIS — Z9221 Personal history of antineoplastic chemotherapy: Secondary | ICD-10-CM | POA: Diagnosis not present

## 2017-10-08 DIAGNOSIS — R600 Localized edema: Secondary | ICD-10-CM

## 2017-10-08 LAB — CBC WITH DIFFERENTIAL/PLATELET
Basophils Absolute: 0 10*3/uL (ref 0.0–0.1)
Basophils Relative: 0 %
Eosinophils Absolute: 0 10*3/uL (ref 0.0–0.7)
Eosinophils Relative: 0 %
HCT: 31.9 % — ABNORMAL LOW (ref 36.0–46.0)
HEMOGLOBIN: 9.7 g/dL — AB (ref 12.0–15.0)
LYMPHS ABS: 0.9 10*3/uL (ref 0.7–4.0)
LYMPHS PCT: 17 %
MCH: 24.7 pg — AB (ref 26.0–34.0)
MCHC: 30.4 g/dL (ref 30.0–36.0)
MCV: 81.4 fL (ref 78.0–100.0)
MONOS PCT: 9 %
Monocytes Absolute: 0.5 10*3/uL (ref 0.1–1.0)
NEUTROS PCT: 74 %
Neutro Abs: 4 10*3/uL (ref 1.7–7.7)
Platelets: 234 10*3/uL (ref 150–400)
RBC: 3.92 MIL/uL (ref 3.87–5.11)
RDW: 16.6 % — ABNORMAL HIGH (ref 11.5–15.5)
WBC: 5.3 10*3/uL (ref 4.0–10.5)

## 2017-10-08 LAB — COMPREHENSIVE METABOLIC PANEL
ALT: 18 U/L (ref 14–54)
AST: 22 U/L (ref 15–41)
Albumin: 3.1 g/dL — ABNORMAL LOW (ref 3.5–5.0)
Alkaline Phosphatase: 85 U/L (ref 38–126)
Anion gap: 8 (ref 5–15)
BUN: 9 mg/dL (ref 6–20)
CHLORIDE: 99 mmol/L — AB (ref 101–111)
CO2: 32 mmol/L (ref 22–32)
Calcium: 8.6 mg/dL — ABNORMAL LOW (ref 8.9–10.3)
Creatinine, Ser: 0.88 mg/dL (ref 0.44–1.00)
Glucose, Bld: 122 mg/dL — ABNORMAL HIGH (ref 65–99)
POTASSIUM: 2.8 mmol/L — AB (ref 3.5–5.1)
SODIUM: 139 mmol/L (ref 135–145)
Total Bilirubin: 0.5 mg/dL (ref 0.3–1.2)
Total Protein: 6.4 g/dL — ABNORMAL LOW (ref 6.5–8.1)

## 2017-10-08 MED ORDER — SODIUM CHLORIDE 0.9 % IV SOLN
Freq: Once | INTRAVENOUS | Status: AC
  Administered 2017-10-08: 13:00:00 via INTRAVENOUS
  Filled 2017-10-08: qty 1000

## 2017-10-08 MED ORDER — HEPARIN SOD (PORK) LOCK FLUSH 100 UNIT/ML IV SOLN
500.0000 [IU] | Freq: Once | INTRAVENOUS | Status: AC
Start: 2017-10-08 — End: 2017-10-08
  Administered 2017-10-08: 500 [IU] via INTRAVENOUS

## 2017-10-08 MED ORDER — POTASSIUM CHLORIDE CRYS ER 20 MEQ PO TBCR: 20.0000 meq | EXTENDED_RELEASE_TABLET | Freq: Every day | ORAL | 3 refills | Status: DC

## 2017-10-08 MED ORDER — SPIRONOLACTONE 25 MG PO TABS: 25.0000 mg | ORAL_TABLET | Freq: Every day | ORAL | 3 refills | Status: DC

## 2017-10-08 NOTE — Assessment & Plan Note (Signed)
1.  Metastatic HER-2 positive breast cancer to the bones and brain: - Patient took Xeloda and Tykerb for over a year, Dilaudid discontinued secondary to poor tolerance. - Herceptin and Tykerb 1000 milligrams daily started on 07/23/2017, tolerating very well.  Last echocardiogram on 10/01/2017 shows EF of 60 to 65%. - She reportedly had diarrhea lasting about 2 weeks after her last Herceptin 3 weeks ago.  She went to the emergency room on 09/21/2017 and received fluids.  A CT scan of the abdomen was done which showed 3.3 cm mass in the posterior aspect of the right hepatic lobe which mildly decreased in size.  Stable bone lesions.  New sclerosis involving T10 body.  I have discussed these findings with the patient and her husband in detail. -She has been feeling fairly well in terms of diarrhea for the last 1 week.  However she is still weak.  Her appetite has not returned to normal.  Hence I have recommended holding off today's Herceptin.  She has PET CT scan and brain MRI scheduled on 6/10 and 6/19 respectively.  She will see [place] back 3 weeks from now for her next dose of Herceptin.  In the meanwhile she will continue Tykerb.  Today her potassium was low at 2.8.  We have given fluids with potassium.  I have sent a prescription for potassium 20 mEq to be taken daily.  2.  Bone metastasis: We have switched her Zometa to once every 3 months.  Last Zometa was on 09/17/2017.  She will continue calcium and vitamin D supplements.

## 2017-10-08 NOTE — Progress Notes (Signed)
Per Dr. Delton Coombes - will defer tx today and resume with next scheduled tx date.  Orders rec'd for NS 1 L with KCl 20 mEq and Mg++ 2 g to infuse over 1 hour.    Tolerated infusion.  Alert, in no distress.  Discharged ambulatory in c/o spouse.

## 2017-10-08 NOTE — Progress Notes (Signed)
[name], [place] [address]   CLINIC:  Medical Oncology/Hematology  PCP:  [name], MD [place] [id number] [phone]   REASON FOR VISIT:  Follow-up for metastatic breast cancer, HER-2 positive.  CURRENT THERAPY: Herceptin and Tykerb.  BRIEF ONCOLOGIC HISTORY:    Breast cancer, stage 4 ([place])   04/25/2014 Initial Diagnosis    Breast cancer, stage 4      04/25/2014 Imaging    CT abdomen/pelvis with widespread metastatic disease to the liver, multiple lytic lesions throughout spine and pelvis. No FX or epidural tumor identified      04/26/2014 Imaging    CT head unremarkable      04/26/2014 Imaging    CT chest with no lung mass or pulmonary nodules, no adenopathy. Lytic bone lesions, right 2nd rib      04/27/2014 Initial Biopsy    U/S guided liver biopsy, lesion in anterior and inferior left hepatic lobe biopsied      04/27/2014 Pathology Results    Metastatic adenocarcinoma, CK7, ER+, patchy positivity with PR. Possible primary includes breast, less likely gynecologic      05/15/2014 Mammogram    BI-RADS CATEGORY  2: Benign Finding(s)      05/16/2014 PET scan    1. Intensely hypermetabolic hepatic metastasis. 2. Widespread hypermetabolic skeletal lesions. 3. No primary adenocarcinoma identified by FDG PET imaging.      05/19/2014 Imaging    MUGA- Left ventricular ejection fracture greater than 70%.      05/21/2014 Breast MRI    No suspicious masses or enhancement within the breasts. No axillary adenopathy.      05/22/2014 - 07/03/2014 Antibody Plan    Herceptin/Perjeta/Tamoxifen      06/12/2014 - 07/03/2014 Chemotherapy    Taxotere added secondary to persistent abdominal and back pain      06/17/2014 - 06/19/2014 Hospital Admission    Neutropenia, fever, diarrhea, nausea, vomiting      06/20/2014 - 07/10/2014 Radiation Therapy    Dr. Thea Silversmith 12 fractions to L3-S3 (30 Gy) and left scapula (20 Gy).        07/03/2014 Adverse Reaction    Perjeta- induced diarrhea.  Perjeta discontinued      07/16/2014 - 07/20/2014 Hospital Admission    Electrolyte abnormalities, and diarrhea.  Suspect Perjeta-induced diarrhea.  Negative GI work-up.      07/24/2014 - 08/19/2015 Chemotherapy    Herceptin/Tamoxifen/Xgeva      08/21/2014 Imaging    MUGA- Left ventricular ejection fraction equals 71%.      08/24/2014 PET scan    Dramatic reduction in metabolic activity of the widespread liver metastasis. Liver metastasis now have metabolic activity equal to background normal liver activity. Liver has a nodular contour. Marked reduction in metabolic activity of skeletal lesions..      10/05/2014 Progression    Widespread metastatic disease to the brain as described. Between 20 and 30 intracranial metastatic deposits are now seen. No midline shift or incipient herniation      10/09/2014 - 10/26/2014 Radiation Therapy    Whole Brain XRT      11/14/2014 Imaging    MUGA- LVEF 67%      02/13/2015 Imaging    MUGA- LVEF 59%      02/15/2015 Treatment Plan Change    Due to declining LVEF, will hold Herceptin per PI guidelines.      04/12/2015 -  Chemotherapy    Herceptin restarted  06/02/2015 - 06/08/2015 Hospital Admission    Pneumonia      07/05/2015 Progression     PET/CT concern for mild progression of skeletal metastasis with several lesions within the spine and 1 lesion in the Left iliac wing with mild to moderate metabolic activity new from prior. Rising CA 27-29      07/16/2015 - 10/23/2015 Anti-estrogen oral therapy    Arimidex      07/16/2015 Imaging    MRI brain with satisfactory post treatment apperance of brain. interval resolved enhancing R caudate metastasis, minimal punctate residual enhancing metastatic disease at the inferior L cerebellum. No new metastatic disease or new intracranial abnormality      07/19/2015 Treatment Plan Change    Discontinue Tamoxifen, Zoladex plus Arimidex.        08/27/2015 Procedure    Laparoscopic bilateral salpingo-oophorectomy by Dr. Gaetano Net      10/17/2015 PET scan    Osseous metastatic disease appears slightly progressive based on a new right scapular lesion and increased uptake within lesions in the thoracic spine, left iliac wing and  proximal right femur.      10/17/2015 Progression    Slight progression on PET scan imaging.      10/18/2015 Imaging    REsolved enhancing metastatic disease to the brain status post WBXRT      10/23/2015 - 02/12/2016 Adjuvant Chemotherapy    Faslodex loading followed by maintenance dose.  (Herceptin continued)      11/04/2015 Imaging    MUGA- LEFT ventricular ejection fraction 51% slightly decreased in a 57% on the previous exam.      12/24/2015 Treatment Plan Change    Zometa every 28 days.  Xgeva discontinued.        01/01/2016 Imaging    MUGA- Left ventricular ejection fraction equals 57.9%. This is increased from 51.1% previously.      02/03/2016 PET scan    1. Mixed metabolic changes in the scattered hypermetabolic sclerotic osseous metastases throughout the axial and proximal appendicular skeleton as detailed above. 2. No new sites of hypermetabolic metastatic disease. Stable pseudo-cirrhotic appearance of the liver due to treated liver metastases with no hypermetabolic liver metastases.       02/03/2016 Progression    PET shows mixed osseous response.  Some lesions more hypermetabolic, others improved.      02/05/2016 Treatment Plan Change    D/C Herceptin.  Faslodex as scheduled on 02/12/2016, then discontinued.  Continue Zometa.      02/05/2016 Treatment Plan Change    Prescriptions for Xeloda 7 days on and 7 days off and Tykerb printed and provided for authorization.      02/19/2016 -  Chemotherapy    Xeloda 2300 mg BID 7 days on and 7 days off and Tykerb       02/24/2016 Treatment Plan Change    Xeloda dose reduced by 10% to 2000 mg BID week on and week off.      03/18/2016  Treatment Plan Change    Xeloda dose reduced to 2000 mg in AM and 1500 mg in PM 7 days on and 7 days off.      03/23/2016 Imaging    MUGA- Normal LEFT ventricular ejection fraction of 56% not significantly changed from 58% on previous exam.      05/19/2016 Imaging    MRI brain- . No new focus of abnormal enhancement to suggest interval metastatic disease. 2. No acute intracranial abnormality. 3. Stable foci of T2 FLAIR hyperintensity in white matter and  in the right caudate head compatible with posttreatment changes and treated metastasis.      05/20/2016 PET scan    1. The multiple osseous metastatic lesions shown to be hypermetabolic on the prior exam are reduced in activity or even resolved in hypermetabolic activity compared to prior, as detailed above. There are also numerous sclerotic bony lesions wedge are not currently and were not previously hypermetabolic, compatible with old non active lesions. 2. No findings of extra osseous metastatic disease currently. 3. Hepatic pseudocirrhosis. 4. Healing rib fractures. Chronic pathologic fracture the left acromion.      07/08/2016 Imaging    MUGA- Low normal LEFT ventricular ejection fraction of 53%, little changed since the 56% on 03/23/2016 but slightly decreased from the 58% on 01/01/2016.      09/07/2016 Echocardiogram    MUGA- Normal LEFT ventricular ejection fraction of 59% with normal LV wall motion.      09/07/2016 Treatment Plan Change    Xeloda dose reduced to 1500 mg BID 7 days on and 7 days off.      09/15/2016 PET scan    No significant change in diffuse sclerotic bone metastases on CT images, although several show mildly increased FDG uptake since prior study.  No evidence of soft tissue metastatic disease.      11/02/2016 Imaging    MRI brain- No change from the prior study. Previously noted metastatic deposits have been treated. No new lesions identified.        CANCER STAGING: Cancer  Staging Breast cancer, stage 4 ([place]) Staging form: Breast, AJCC 7th Edition - Clinical stage from 05/27/2014: Stage IV (T0, N0, M1) - Signed by Baird Cancer, PA-C on 05/27/2014    INTERVAL HISTORY:  [name] 52 y.o. female returns for follow-up of metastatic HER-2 positive breast cancer.  She received last Herceptin 3 weeks ago.  She had developed intense diarrhea which lasted about 2 weeks.  She had to go to the emergency room on 09/21/2017 for fluids.  A CT scan was done at that time.  She reports improvement in her diarrhea for the last 1 week.  However her appetite has not returned to normal.  Denies any new onset pains.  Usually she has diarrhea lasting 2 to 3 days after each dose of Herceptin.  She took Imodium without much help.  She was also taking Zofran as needed for nausea.  She vomited couple of times in those 2 weeks.  REVIEW OF SYSTEMS:  Review of Systems  Constitutional: Positive for appetite change and fatigue.  Gastrointestinal: Positive for diarrhea and nausea.  All other systems reviewed and are negative.    PAST MEDICAL/SURGICAL HISTORY:  Past Medical History:  Diagnosis Date  . Anticoagulated    xarelto  . Anxiety   . Breast cancer metastasized to multiple sites [facility])    liver, brain, and bone  . Breast cancer, stage 4 [facility]) oncologist-  dr Larene [place] ([facility])   dx 12/ 2015 -- breast cancer Stage 4,  ER/HER2 +,  w/  liver, brain and  bone mets/  chemotherapy and radiation therapy  . Chronic pain syndrome    secondary to cancer   . Depression   . Drug-induced cardiomyopathy ([place])    per last MUGA (08-08-2015), ef 56.5/ per last echo 05-18-2014 ef 60-65%  . Family history of prostate cancer   . GERD (gastroesophageal reflux disease)   . History of colon polyps    07-13-2013  benign  . History  of DVT (deep vein thrombosis)    07-09-2014  upper right extremity-  RIJ and right subclavian--  resolved  . History of gastritis    erosive  .  History of pneumonia    HCAP 06-07-2015--  resolved per cxr 07-04-2015  . History of radiation therapy    12 fractions to L3 - S3, 30Gy and left spacula 20Gy (06-20-2014 to 07-10-2014) //  whole brain rxt (10-09-2014 to 10-26-2014)  . History of small bowel obstruction    S/P RESECTION 2008  . Migraine   . PONV (postoperative nausea and vomiting)    pt states scope patch does well   Past Surgical History:  Procedure Laterality Date  . BREAST REDUCTION SURGERY  03/17/2011   Procedure: MAMMARY REDUCTION BILATERAL (BREAST);  Surgeon: Mary A Contogiannis;  Location: [place];  Service: Plastics;  Laterality: Bilateral;  . CATARACT EXTRACTION W/ INTRAOCULAR LENS  IMPLANT, BILATERAL  2008  . CERVICAL FUSION  2003   C5 -- C6  . COLONOSCOPY N/A 07/13/2013   Procedure: COLONOSCOPY;  Surgeon: Rogene Houston, MD;  [facility];  Service: Endoscopy;  Laterality: N/A;  930  . COLONOSCOPY N/A 11/26/2014   Procedure: COLONOSCOPY;  Surgeon: Rogene Houston, MD;  [facility];  Service: Endoscopy;  Laterality: N/A;  730  . D & C HYSTEROSCOPY/  RESECTION ENDOMETRIAL MASS/  [place] ENDOMETRIAL ABLATION  04-11-2010  . DX LAPAROSCOPY W/ PARTIAL SMALL BOWEL RESECTION AND APPENDECTOMY  04-13-2007  . ESOPHAGOGASTRODUODENOSCOPY N/A 05/25/2014   Procedure: ESOPHAGOGASTRODUODENOSCOPY (EGD);  Surgeon: Rogene Houston, MD;  [facility];  Service: Endoscopy;  Laterality: N/A;  155  . KNEE ARTHROSCOPY Right 2005  . LAPAROSCOPIC ASSISTED VAGINAL HYSTERECTOMY  10-13-2010   w/ Bx Left Fallopian tube and Aspiration Right Ovarian Cyst  . LAPAROSCOPIC CHOLECYSTECTOMY  11-17-2002  . LAPAROSCOPIC SALPINGO OOPHERECTOMY Bilateral 08/26/2015   Procedure: LAPAROSCOPIC SALPINGO OOPHORECTOMY, bilateral;  Surgeon: Everlene Farrier, MD;  Location: [place];  Service: Gynecology;  Laterality: Bilateral;  . PORTACATH PLACEMENT  05-17-2014  . REDUCTION MAMMAPLASTY    .  SHOULDER ARTHROSCOPY WITH ROTATOR CUFF REPAIR Right 2002  . TRANSTHORACIC ECHOCARDIOGRAM  05-18-2014   ef 60-65%//   last MUGA  (08-08-2015)  ef 56.6%       SOCIAL HISTORY:  Social History   Socioeconomic History  . Marital status: Married    Spouse name: Not on file  . Number of children: Not on file  . Years of education: Not on file  . Highest education level: Not on file  Occupational History  . Not on file  Social Needs  . Financial resource strain: Not on file  . Food insecurity:    Worry: Not on file    Inability: Not on file  . Transportation needs:    Medical: Not on file    Non-medical: Not on file  Tobacco Use  . Smoking status: Former Smoker    Types: Cigarettes    Last attempt to quit: 08/20/1994    Years since quitting: 23.1  . Smokeless tobacco: Never Used  Substance and Sexual Activity  . Alcohol use: Yes    Comment: Occasionally  . Drug use: No  . Sexual activity: Yes    Partners: Male    Birth control/protection: Surgical, Condom  Lifestyle  . Physical activity:    Days per week: Not on file    Minutes per session: Not on file  . Stress: Not on file  Relationships  . Social connections:    Talks on phone: Not on file    Gets together: Not on file    Attends religious service: Not on file    Active member of club or organization: Not on file    Attends meetings of clubs or organizations: Not on file    Relationship status: Not on file  . Intimate partner violence:    Fear of current or ex partner: Not on file    Emotionally abused: Not on file    Physically abused: Not on file    Forced sexual activity: Not on file  Other Topics Concern  . Not on file  Social History Narrative  . Not on file    FAMILY HISTORY:  Family History  Problem Relation Age of Onset  . Diabetes Father   . Heart attack Maternal Grandmother 30       multiple over lifetime.  . Cancer Maternal Grandmother 54       NOS  . Prostate cancer Maternal Grandfather         dx in his 36s  . Lung cancer Paternal Grandfather        dx <50  . Lymphoma Maternal Aunt        dx in her 20s  . Melanoma Cousin 63       maternal first cousin  . Brain cancer Cousin        paternal first cousin dx under 57  . Prostate cancer Other        MGF's father  . Colon cancer Other        MGM's mother    CURRENT MEDICATIONS:  Outpatient Encounter Medications as of 10/08/2017  Medication Sig  . acetaminophen (TYLENOL) 325 MG tablet Take 650 mg by mouth every 6 (six) hours as needed for moderate pain or headache.  . calcium carbonate (TUMS - DOSED IN MG ELEMENTAL CALCIUM) 500 MG chewable tablet Chew 2 tablets by mouth daily.   . Calcium-Phosphorus-Vitamin D (CALCIUM/D3 ADULT GUMMIES PO) Take 2 tablets by mouth 2 (two) times daily. dosage of calcium 1255m and vitamin d 10032m . desvenlafaxine (PRISTIQ) 100 MG 24 hr tablet Take 1 tablet (100 mg total) by mouth daily.  . Marland Kitchenicyclomine (BENTYL) 20 MG tablet Take 1 tablet (20 mg total) by mouth 4 (four) times daily -  before meals and at bedtime.  . diphenhydrAMINE (BENADRYL) 25 mg capsule Take 25 mg by mouth at bedtime as needed for itching.   . diphenoxylate-atropine (LOMOTIL) 2.5-0.025 MG tablet Take 2 tablets by mouth 4 (four) times daily as needed for diarrhea or loose stools.  . gabapentin (NEURONTIN) 300 MG capsule TAKE TWO CAPSULES BY MOUTH AT BEDTIME  . lapatinib (TYKERB) 250 MG tablet Take 4 tablets (1,000 mg total) by mouth daily.  . Marland Kitchenidocaine-prilocaine (EMLA) cream Apply 1 application topically daily as needed (apply to port before chemo).  . LORazepam (ATIVAN) 0.5 MG tablet TAKE ONE TABLET BY MOUTH EVERY 6 HOURS AS NEEDED FOR ANXIETY  . Melatonin 10 MG CAPS Take 1 tablet by mouth at bedtime. Pt states she takes one tablet at night for sleep   . methylphenidate (RITALIN) 20 MG tablet Take 20 mg by mouth daily.  . Marland Kitchenorphine (MS CONTIN) 30 MG 12 hr tablet TAKE 1 TABLET BY MOUTH EVERY TWELVE HOURS  . NARCAN 4 MG/0.1ML  LIQD nasal spray kit CALL 911. ADMINISTER A SINGLE SPRAY OF NARCAN IN ONE NOSTRIL, REPEAT EVERY 3 MINUTES AS NEEDED IF  NO OR MINIMAL RESPONSE  . ondansetron (ZOFRAN) 4 MG tablet Take 1 tablet (4 mg total) by mouth every 8 (eight) hours as needed for nausea or vomiting.  Marland Kitchen oxyCODONE (OXY IR/ROXICODONE) 5 MG immediate release tablet Take 1-2 tablets (5-10 mg total) by mouth every 4 (four) hours as needed for moderate pain.  . pantoprazole (PROTONIX) 40 MG tablet TAKE ONE TABLET BY MOUTH EVERY TWELVE HOURS  . promethazine (PHENERGAN) 25 MG tablet TAKE 1 TABLET BY MOUTH EVERY SIX HOURS AS NEEDED FOR NAUSEA OR VOMITING  . rivaroxaban (XARELTO) 20 MG TABS tablet TAKE 1 TABLET BY MOUTH EVERY DAY WITH SUPPER  . spironolactone (ALDACTONE) 25 MG tablet TAKE 1 TABLET BY MOUTH EVERY DAY  . Zoledronic Acid (ZOMETA IV) Inject into the vein every 3 (three) months.  . zolpidem (AMBIEN) 10 MG tablet Take 1 tablet (10 mg total) by mouth at bedtime as needed. (Patient taking differently: Take 10 mg by mouth at bedtime as needed for sleep. )  . potassium chloride SA (K-DUR,KLOR-CON) 20 MEQ tablet Take 1 tablet (20 mEq total) by mouth daily.   No facility-administered encounter medications on file as of 10/08/2017.     ALLERGIES:  No Known Allergies   PHYSICAL EXAM:  ECOG Performance status: 1  I have reviewed her vital signs today. Physical Exam HEENT shows oropharynx is no thrush. Chest is bilaterally clear. Cardiovascular S1-S2 regular rate and rhythm. Extremities no edema or cyanosis.  LABORATORY DATA:  I have reviewed the labs as listed.  CBC    Component Value Date/Time   WBC 5.3 10/08/2017 1123   RBC 3.92 10/08/2017 1123   HGB 9.7 (L) 10/08/2017 1123   HCT 31.9 (L) 10/08/2017 1123   PLT 234 10/08/2017 1123   MCV 81.4 10/08/2017 1123   MCH 24.7 (L) 10/08/2017 1123   MCHC 30.4 10/08/2017 1123   RDW 16.6 (H) 10/08/2017 1123   LYMPHSABS 0.9 10/08/2017 1123   MONOABS 0.5 10/08/2017 1123    EOSABS 0.0 10/08/2017 1123   BASOSABS 0.0 10/08/2017 1123   CMP Latest Ref Rng & Units 10/08/2017 09/20/2017 09/17/2017  Glucose 65 - 99 mg/dL 122(H) 136(H) 152(H)  BUN 6 - 20 mg/dL '9 15 10  ' Creatinine 0.44 - 1.00 mg/dL 0.88 1.13(H) 0.94  Sodium 135 - 145 mmol/L 139 135 138  Potassium 3.5 - 5.1 mmol/L 2.8(L) 3.4(L) 3.4(L)  Chloride 101 - 111 mmol/L 99(L) 99(L) 100(L)  CO2 22 - 32 mmol/L 32 24 29  Calcium 8.9 - 10.3 mg/dL 8.6(L) 9.6 9.2  Total Protein 6.5 - 8.1 g/dL 6.4(L) 8.4(H) 7.1  Total Bilirubin 0.3 - 1.2 mg/dL 0.5 0.6 0.3  Alkaline Phos 38 - 126 U/L 85 121 97  AST 15 - 41 U/L '22 26 22  ' ALT 14 - 54 U/L '18 21 16       ' DIAGNOSTIC IMAGING:  I have reviewed CT scan dated 09/21/2017 images and agree with report.    ASSESSMENT & PLAN:   Breast cancer, stage 4 ([place]) 1.  Metastatic HER-2 positive breast cancer to the bones and brain: - Patient took Xeloda and Tykerb for over a year, Dilaudid discontinued secondary to poor tolerance. - Herceptin and Tykerb 1000 milligrams daily started on 07/23/2017, tolerating very well.  Last echocardiogram on 10/01/2017 shows EF of 60 to 65%. - She reportedly had diarrhea lasting about 2 weeks after her last Herceptin 3 weeks ago.  She went to the emergency room on 09/21/2017 and received fluids.  A CT scan  of the abdomen was done which showed 3.3 cm mass in the posterior aspect of the right hepatic lobe which mildly decreased in size.  Stable bone lesions.  New sclerosis involving T10 body.  I have discussed these findings with the patient and her husband in detail. -She has been feeling fairly well in terms of diarrhea for the last 1 week.  However she is still weak.  Her appetite has not returned to normal.  Hence I have recommended holding off today's Herceptin.  She has PET CT scan and brain MRI scheduled on 6/10 and 6/19 respectively.  She will see [place] back 3 weeks from now for her next dose of Herceptin.  In the meanwhile she will continue Tykerb.   Today her potassium was low at 2.8.  We have given fluids with potassium.  I have sent a prescription for potassium 20 mEq to be taken daily.  2.  Bone metastasis: We have switched her Zometa to once every 3 months.  Last Zometa was on 09/17/2017.  She will continue calcium and vitamin D supplements.      Orders placed this encounter:  Orders Placed This Encounter  Procedures  . CBC with Differential/Platelet  . Comprehensive metabolic panel      Derek Jack, MD [place] [phone]

## 2017-10-08 NOTE — Patient Instructions (Signed)
[facility] at [facility] Discharge Instructions  Today you saw Dr. K.   Thank you for choosing [facility] at [facility] to provide your oncology and hematology care.  To afford each patient quality time with our provider, please arrive at least 15 minutes before your scheduled appointment time.   If you have a lab appointment with the Cancer Center please come in thru the  Main Entrance and check in at the main information desk  You need to re-schedule your appointment should you arrive 10 or more minutes late.  We strive to give you quality time with our providers, and arriving late affects you and other patients whose appointments are after yours.  Also, if you no show three or more times for appointments you may be dismissed from the clinic at the providers discretion.     Again, thank you for choosing [facility].  Our hope is that these requests will decrease the amount of time that you wait before being seen by our physicians.       _____________________________________________________________  Should you have questions after your visit to [facility], please contact our office at [phone] between the hours of 8:30 a.m. and 4:30 p.m.  Voicemails left after 4:30 p.m. will not be returned until the following business day.  For prescription refill requests, have your pharmacy contact our office.       Resources For Cancer Patients and their Caregivers ? American Cancer Society: Can assist with transportation, wigs, general needs, runs Look Good Feel Better.        1-888-227-6333 ? Cancer Care: Provides financial assistance, online support groups, medication/co-pay assistance.  1-800-813-HOPE (4673) ? [facility] Assists [place] cancer patients and their families through emotional , educational and financial support.  336-427-4357 ? [place] DSS Where to apply for food  stamps, Medicaid and utility assistance. 336-342-1394 ? [facility]: Transportation to medical appointments. 336-347-2287 ? Social Security Administration: May apply for disability if have a Stage IV cancer. 336-342-7796 1-800-772-1213 ? [place] Aging, Disability and Transit Services: Assists with nutrition, care and transit needs. 336-349-2343  Cancer Center Support Programs:   > Cancer Support Group  2nd Tuesday of the month 1pm-2pm, Journey Room   > Creative Journey  3rd Tuesday of the month 1130am-1pm, Journey Room    

## 2017-10-11 ENCOUNTER — Other Ambulatory Visit (HOSPITAL_COMMUNITY): Payer: Self-pay

## 2017-10-11 DIAGNOSIS — C50919 Malignant neoplasm of unspecified site of unspecified female breast: Secondary | ICD-10-CM

## 2017-10-11 MED ORDER — RIVAROXABAN 20 MG PO TABS: ORAL_TABLET | ORAL | 3 refills | Status: DC

## 2017-10-11 NOTE — Telephone Encounter (Signed)
Refill request for xarelto from [facility].  Chart checked and filled.

## 2017-10-16 ENCOUNTER — Other Ambulatory Visit (HOSPITAL_COMMUNITY): Payer: Self-pay | Admitting: Hematology

## 2017-10-18 ENCOUNTER — Encounter (HOSPITAL_COMMUNITY)
Admission: RE | Admit: 2017-10-18 | Discharge: 2017-10-18 | Disposition: A | Discharge status: Home / Self Care | Payer: Medicare Other | Source: Ambulatory Visit | Attending: Hematology | Admitting: Hematology

## 2017-10-18 DIAGNOSIS — C7951 Secondary malignant neoplasm of bone: Secondary | ICD-10-CM | POA: Diagnosis not present

## 2017-10-18 DIAGNOSIS — Z5111 Encounter for antineoplastic chemotherapy: Secondary | ICD-10-CM | POA: Diagnosis not present

## 2017-10-18 DIAGNOSIS — C50919 Malignant neoplasm of unspecified site of unspecified female breast: Secondary | ICD-10-CM | POA: Diagnosis not present

## 2017-10-18 MED ORDER — FLUDEOXYGLUCOSE F - 18 (FDG) INJECTION
10.0000 | Freq: Once | INTRAVENOUS | Status: AC | PRN
  Administered 2017-10-18: 10.73 via INTRAVENOUS

## 2017-10-27 ENCOUNTER — Encounter (HOSPITAL_COMMUNITY): Payer: Self-pay | Admitting: Psychiatry

## 2017-10-27 ENCOUNTER — Ambulatory Visit (HOSPITAL_COMMUNITY)
Admission: RE | Admit: 2017-10-27 | Discharge: 2017-10-27 | Disposition: A | Discharge status: Home / Self Care | Payer: Medicare Other | Source: Ambulatory Visit | Attending: Hematology | Admitting: Hematology

## 2017-10-27 ENCOUNTER — Ambulatory Visit (INDEPENDENT_AMBULATORY_CARE_PROVIDER_SITE_OTHER): Payer: Medicare Other | Admitting: Psychiatry

## 2017-10-27 ENCOUNTER — Ambulatory Visit (HOSPITAL_COMMUNITY): Payer: Medicare Other

## 2017-10-27 ENCOUNTER — Ambulatory Visit (HOSPITAL_COMMUNITY): Payer: Self-pay

## 2017-10-27 DIAGNOSIS — C50919 Malignant neoplasm of unspecified site of unspecified female breast: Secondary | ICD-10-CM

## 2017-10-27 DIAGNOSIS — F418 Other specified anxiety disorders: Secondary | ICD-10-CM

## 2017-10-27 DIAGNOSIS — G4700 Insomnia, unspecified: Secondary | ICD-10-CM

## 2017-10-27 MED ORDER — TRAZODONE HCL 50 MG PO TABS: ORAL_TABLET | ORAL | 0 refills | Status: DC

## 2017-10-27 MED ORDER — DESVENLAFAXINE SUCCINATE ER 100 MG PO TB24: 100.0000 mg | ORAL_TABLET | Freq: Every day | ORAL | 1 refills | Status: DC

## 2017-10-27 MED ORDER — ZOLPIDEM TARTRATE 10 MG PO TABS: 10.0000 mg | ORAL_TABLET | Freq: Every evening | ORAL | 1 refills | Status: DC | PRN

## 2017-10-27 MED ORDER — GADOBENATE DIMEGLUMINE 529 MG/ML IV SOLN
20.0000 mL | Freq: Once | INTRAVENOUS | Status: AC | PRN
  Administered 2017-10-27: 20 mL via INTRAVENOUS

## 2017-10-27 NOTE — Progress Notes (Signed)
BH MD/PA/NP OP Progress Note  10/27/2017 11:23 AM [name]  MRN:  [id number]  Chief Complaint: Doing okay, my hair is growing back HPI: [name] presents with stable mood and energy is improving with use of the CPAP machine.  She uses the CPAP consistently, and reports that she has tolerated it well and has actually come to feel like she needs it in order to even start to get sleepy at night.  She notably has some continued difficulties with middle of the night insomnia and early awakenings.  I suggested the use of trazodone given that it is safe and effective for middle night insomnia.  She can continue to use Ambien on an as-needed basis.  Discussed that the dose of trazodone as a fairly wide range and she can use 50-100 mg nightly to start with.  She denies any unsafe thoughts and reports that she has been in a generally good place with regard to her mental health, continues to contend with multiple physical health issues.  She is aware that writer is leaving this office at the end of August and given the stability of her medication regimen and mood, I suggested that she may be appropriate to have refills with her primary care her oncologist, to continue on Pristiq and trazodone, along with as needed Ambien.    We discussed other options including arranging for psychiatric follow-up with a provider at this office or closer to her and [place].  Spent time celebrating some of her recent successes and some of the things going well in her personal life.  I encouraged the patient to please let us know if anything comes up in the interim or if she has any issues with trazodone, and she agrees to reach out if that is the case.  Visit Diagnosis:    ICD-10-CM   1. Breast cancer, stage 4, unspecified laterality (HCC) C50.919 desvenlafaxine (PRISTIQ) 100 MG 24 hr tablet  2. Metastatic breast cancer (HCC) C50.919 desvenlafaxine (PRISTIQ) 100 MG 24 hr tablet  3. Depression with anxiety F41.8  desvenlafaxine (PRISTIQ) 100 MG 24 hr tablet    traZODone (DESYREL) 50 MG tablet  4. Insomnia, unspecified type G47.00 zolpidem (AMBIEN) 10 MG tablet    traZODone (DESYREL) 50 MG tablet    Past Psychiatric History: See intake H&P for full details. Reviewed, with no updates at this time.   Past Medical History:  Past Medical History:  Diagnosis Date  . Anticoagulated    xarelto  . Anxiety   . Breast cancer metastasized to multiple sites [facility])    liver, brain, and bone  . Breast cancer, stage 4 [facility]) oncologist-  dr Larene [place] ([facility]   dx 12/ 2015 -- breast cancer Stage 4,  ER/HER2 +,  w/  liver, brain and  bone mets/  chemotherapy and radiation therapy  . Chronic pain syndrome    secondary to cancer   . Depression   . Drug-induced cardiomyopathy ([place])    per last MUGA (08-08-2015), ef 56.5/ per last echo 05-18-2014 ef 60-65%  . Family history of prostate cancer   . GERD (gastroesophageal reflux disease)   . History of colon polyps    07-13-2013  benign  . History of DVT (deep vein thrombosis)    07-09-2014  upper right extremity-  RIJ and right subclavian--  resolved  . History of gastritis    erosive  . History of pneumonia    HCAP 06-07-2015--  resolved per cxr 07-04-2015  .  History of radiation therapy    12 fractions to L3 - S3, 30Gy and left spacula 20Gy (06-20-2014 to 07-10-2014) //  whole brain rxt (10-09-2014 to 10-26-2014)  . History of small bowel obstruction    S/P RESECTION 2008  . Migraine   . PONV (postoperative nausea and vomiting)    pt states scope patch does well    Past Surgical History:  Procedure Laterality Date  . BREAST REDUCTION SURGERY  03/17/2011   Procedure: MAMMARY REDUCTION BILATERAL (BREAST);  Surgeon: Mary A Contogiannis;  Location: [place];  Service: Plastics;  Laterality: Bilateral;  . CATARACT EXTRACTION W/ INTRAOCULAR LENS  IMPLANT, BILATERAL  2008  . CERVICAL FUSION  2003   C5 -- C6  . COLONOSCOPY  N/A 07/13/2013   Procedure: COLONOSCOPY;  Surgeon: Rogene Houston, MD;  [facility];  Service: Endoscopy;  Laterality: N/A;  930  . COLONOSCOPY N/A 11/26/2014   Procedure: COLONOSCOPY;  Surgeon: Rogene Houston, MD;  [facility];  Service: Endoscopy;  Laterality: N/A;  730  . D & C HYSTEROSCOPY/  RESECTION ENDOMETRIAL MASS/  [place] ENDOMETRIAL ABLATION  04-11-2010  . DX LAPAROSCOPY W/ PARTIAL SMALL BOWEL RESECTION AND APPENDECTOMY  04-13-2007  . ESOPHAGOGASTRODUODENOSCOPY N/A 05/25/2014   Procedure: ESOPHAGOGASTRODUODENOSCOPY (EGD);  Surgeon: Rogene Houston, MD;  [facility];  Service: Endoscopy;  Laterality: N/A;  155  . KNEE ARTHROSCOPY Right 2005  . LAPAROSCOPIC ASSISTED VAGINAL HYSTERECTOMY  10-13-2010   w/ Bx Left Fallopian tube and Aspiration Right Ovarian Cyst  . LAPAROSCOPIC CHOLECYSTECTOMY  11-17-2002  . LAPAROSCOPIC SALPINGO OOPHERECTOMY Bilateral 08/26/2015   Procedure: LAPAROSCOPIC SALPINGO OOPHORECTOMY, bilateral;  Surgeon: Everlene Farrier, MD;  Location: [place];  Service: Gynecology;  Laterality: Bilateral;  . PORTACATH PLACEMENT  05-17-2014  . REDUCTION MAMMAPLASTY    . SHOULDER ARTHROSCOPY WITH ROTATOR CUFF REPAIR Right 2002  . TRANSTHORACIC ECHOCARDIOGRAM  05-18-2014   ef 60-65%//   last MUGA  (08-08-2015)  ef 56.6%      Family Psychiatric History: See intake H&P for full details. Reviewed, with no updates at this time.   Family History:  Family History  Problem Relation Age of Onset  . Diabetes Father   . Heart attack Maternal Grandmother 30       multiple over lifetime.  . Cancer Maternal Grandmother 58       NOS  . Prostate cancer Maternal Grandfather        dx in his 40s  . Lung cancer Paternal Grandfather        dx <50  . Lymphoma Maternal Aunt        dx in her 53s  . Melanoma Cousin 43       maternal first cousin  . Brain cancer Cousin        paternal first cousin dx under 60  . Prostate cancer Other         MGF's father  . Colon cancer Other        MGM's mother    Social History:  Social History   Socioeconomic History  . Marital status: Married    Spouse name: Not on file  . Number of children: Not on file  . Years of education: Not on file  . Highest education level: Not on file  Occupational History  . Not on file  Social Needs  . Financial resource strain: Not on file  . Food insecurity:    Worry: Not on  file    Inability: Not on file  . Transportation needs:    Medical: Not on file    Non-medical: Not on file  Tobacco Use  . Smoking status: Former Smoker    Types: Cigarettes    Last attempt to quit: 08/20/1994    Years since quitting: 23.2  . Smokeless tobacco: Never Used  Substance and Sexual Activity  . Alcohol use: Yes    Comment: Occasionally  . Drug use: No  . Sexual activity: Yes    Partners: Male    Birth control/protection: Surgical, Condom  Lifestyle  . Physical activity:    Days per week: Not on file    Minutes per session: Not on file  . Stress: Not on file  Relationships  . Social connections:    Talks on phone: Not on file    Gets together: Not on file    Attends religious service: Not on file    Active member of club or organization: Not on file    Attends meetings of clubs or organizations: Not on file    Relationship status: Not on file  Other Topics Concern  . Not on file  Social History Narrative  . Not on file    Allergies: No Known Allergies  Metabolic Disorder Labs: Lab Results  Component Value Date   HGBA1C 5.9 (H) 06/23/2017   MPG 122.63 06/23/2017   No results found for: PROLACTIN No results found for: CHOL, TRIG, HDL, CHOLHDL, VLDL, LDLCALC Lab Results  Component Value Date   TSH 2.519 04/17/2015   TSH 0.774 12/24/2014    Therapeutic Level Labs: No results found for: LITHIUM No results found for: VALPROATE No components found for:  CBMZ  Current Medications: Current Outpatient Medications  Medication Sig  Dispense Refill  . acetaminophen (TYLENOL) 325 MG tablet Take 650 mg by mouth every 6 (six) hours as needed for moderate pain or headache.    . calcium carbonate (TUMS - DOSED IN MG ELEMENTAL CALCIUM) 500 MG chewable tablet Chew 2 tablets by mouth daily.     . Calcium-Phosphorus-Vitamin D (CALCIUM/D3 ADULT GUMMIES PO) Take 2 tablets by mouth 2 (two) times daily. dosage of calcium 1246m and vitamin d 10096m   . desvenlafaxine (PRISTIQ) 100 MG 24 hr tablet Take 1 tablet (100 mg total) by mouth daily. 90 tablet 1  . diphenhydrAMINE (BENADRYL) 25 mg capsule Take 25 mg by mouth at bedtime as needed for itching.     . gabapentin (NEURONTIN) 300 MG capsule TAKE TWO CAPSULES BY MOUTH AT BEDTIME 60 capsule 5  . lapatinib (TYKERB) 250 MG tablet Take 4 tablets (1,000 mg total) by mouth daily. 120 tablet 0  . lidocaine-prilocaine (EMLA) cream Apply 1 application topically daily as needed (apply to port before chemo). 30 g 2  . LORazepam (ATIVAN) 0.5 MG tablet TAKE ONE TABLET BY MOUTH EVERY 6 HOURS AS NEEDED FOR ANXIETY 30 tablet 2  . Melatonin 10 MG CAPS Take 1 tablet by mouth at bedtime. Pt states she takes one tablet at night for sleep     . morphine (MS CONTIN) 30 MG 12 hr tablet TAKE 1 TABLET BY MOUTH EVERY TWELVE HOURS 60 tablet 0  . NARCAN 4 MG/0.1ML LIQD nasal spray kit CALL 911. ADMINISTER A SINGLE SPRAY OF NARCAN IN ONE NOSTRIL, REPEAT EVERY 3 MINUTES AS NEEDED IF NO OR MINIMAL RESPONSE  99  . ondansetron (ZOFRAN) 4 MG tablet Take 1 tablet (4 mg total) by mouth every 8 (eight) hours  as needed for nausea or vomiting. 10 tablet 0  . oxyCODONE (OXY IR/ROXICODONE) 5 MG immediate release tablet Take 1-2 tablets (5-10 mg total) by mouth every 4 (four) hours as needed for moderate pain. 60 tablet 0  . pantoprazole (PROTONIX) 40 MG tablet TAKE ONE TABLET BY MOUTH EVERY TWELVE HOURS 60 tablet 3  . potassium chloride SA (K-DUR,KLOR-CON) 20 MEQ tablet Take 1 tablet (20 mEq total) by mouth daily. 30 tablet 3  .  promethazine (PHENERGAN) 25 MG tablet TAKE 1 TABLET BY MOUTH EVERY SIX HOURS AS NEEDED FOR NAUSEA OR VOMITING 30 tablet 6  . rivaroxaban (XARELTO) 20 MG TABS tablet TAKE 1 TABLET BY MOUTH EVERY DAY WITH SUPPER 30 tablet 3  . spironolactone (ALDACTONE) 25 MG tablet Take 1 tablet (25 mg total) by mouth daily. 30 tablet 3  . Zoledronic Acid (ZOMETA IV) Inject into the vein every 3 (three) months.    . zolpidem (AMBIEN) 10 MG tablet Take 1 tablet (10 mg total) by mouth at bedtime as needed for sleep. 30 tablet 1  . dicyclomine (BENTYL) 20 MG tablet Take 1 tablet (20 mg total) by mouth 4 (four) times daily -  before meals and at bedtime. (Patient not taking: Reported on 10/27/2017) 40 tablet 0  . diphenoxylate-atropine (LOMOTIL) 2.5-0.025 MG tablet Take 2 tablets by mouth 4 (four) times daily as needed for diarrhea or loose stools. (Patient not taking: Reported on 10/27/2017) 60 tablet 1  . traZODone (DESYREL) 50 MG tablet Take 1-2 tablets at bedtime for sleep 180 tablet 0   No current facility-administered medications for this visit.      Musculoskeletal: Strength & Muscle Tone: within normal limits Gait & Station: normal Patient leans: N/A  Psychiatric Specialty Exam: ROS  Height '5\' 8"'  (1.727 m), weight 256 lb 12.8 oz (116.5 kg).Body mass index is 39.05 kg/m.  General Appearance: Casual and Well Groomed  Eye Contact:  Good  Speech:  Clear and Coherent and Normal Rate  Volume:  Normal  Mood:  Euthymic  Affect:  Appropriate and Congruent  Thought Process:  Goal Directed and Descriptions of Associations: Intact  Orientation:  Full (Time, Place, and Person)  Thought Content: Logical   Suicidal Thoughts:  No  Homicidal Thoughts:  No  Memory:  Immediate;   Good  Judgement:  Good  Insight:  Good  Psychomotor Activity:  Normal  Concentration:  Concentration: Good  Recall:  Good  Fund of Knowledge: Good  Language: Good  Akathisia:  Negative  Handed:  Right  AIMS (if indicated): not done   Assets:  Communication Skills Desire for Improvement Financial Resources/Insurance Housing  ADL's:  Intact  Cognition: WNL  Sleep:  Fair   Screenings:   Assessment and Plan:  [name] presents with stable mood and anxiety, some difficulty with insomnia but overall feels better with the use of a CPAP.  She has some mild episodes of middle of the night awakening and I think she would benefit from trazodone for this.  She can continue to use Ambien as needed for sleep onset insomnia.  She denies any acute safety concerns and has excellent support from her family.  She is welcome to follow-up with the behavioral health office on an as-needed basis, but otherwise is appropriate to have refills of trazodone, Pristiq, Ambien with her primary care providers.  1. Breast cancer, stage 4, unspecified laterality ([place])   2. Metastatic breast cancer ([place])   3. Depression with anxiety   4. Insomnia, unspecified  type     Status of current problems: stable  Labs Ordered: No orders of the defined types were placed in this encounter.   Labs Reviewed: na  Collateral Obtained/Records Reviewed: na  Plan:  Continue Pristiq 100 mg daily Patient has been using CPAP successfully and I encouraged her to continue to do so, given the notable benefits that she has experienced Trazodone 50-100 mg nightly Ambien 5-10 mg nightly as needed  Aundra Dubin, MD 10/27/2017, 11:23 AM

## 2017-10-29 ENCOUNTER — Inpatient Hospital Stay (HOSPITAL_COMMUNITY): Payer: Medicare Other | Attending: Adult Health | Admitting: Oncology

## 2017-10-29 ENCOUNTER — Inpatient Hospital Stay (HOSPITAL_COMMUNITY): Payer: Medicare Other

## 2017-10-29 ENCOUNTER — Encounter (HOSPITAL_COMMUNITY): Payer: Self-pay | Admitting: Oncology

## 2017-10-29 ENCOUNTER — Other Ambulatory Visit: Payer: Self-pay

## 2017-10-29 VITALS — BP 122/59 | HR 67 | Temp 97.8°F | Resp 18 | Wt 256.5 lb

## 2017-10-29 DIAGNOSIS — E8809 Other disorders of plasma-protein metabolism, not elsewhere classified: Secondary | ICD-10-CM | POA: Insufficient documentation

## 2017-10-29 DIAGNOSIS — F419 Anxiety disorder, unspecified: Secondary | ICD-10-CM | POA: Insufficient documentation

## 2017-10-29 DIAGNOSIS — E876 Hypokalemia: Secondary | ICD-10-CM

## 2017-10-29 DIAGNOSIS — Z87891 Personal history of nicotine dependence: Secondary | ICD-10-CM | POA: Diagnosis not present

## 2017-10-29 DIAGNOSIS — Z5112 Encounter for antineoplastic immunotherapy: Secondary | ICD-10-CM | POA: Diagnosis not present

## 2017-10-29 DIAGNOSIS — C7951 Secondary malignant neoplasm of bone: Secondary | ICD-10-CM | POA: Diagnosis not present

## 2017-10-29 DIAGNOSIS — C787 Secondary malignant neoplasm of liver and intrahepatic bile duct: Secondary | ICD-10-CM

## 2017-10-29 DIAGNOSIS — Z86010 Personal history of colonic polyps: Secondary | ICD-10-CM | POA: Insufficient documentation

## 2017-10-29 DIAGNOSIS — D649 Anemia, unspecified: Secondary | ICD-10-CM | POA: Diagnosis not present

## 2017-10-29 DIAGNOSIS — C50919 Malignant neoplasm of unspecified site of unspecified female breast: Secondary | ICD-10-CM

## 2017-10-29 DIAGNOSIS — Z923 Personal history of irradiation: Secondary | ICD-10-CM | POA: Insufficient documentation

## 2017-10-29 DIAGNOSIS — I427 Cardiomyopathy due to drug and external agent: Secondary | ICD-10-CM | POA: Insufficient documentation

## 2017-10-29 DIAGNOSIS — Z9221 Personal history of antineoplastic chemotherapy: Secondary | ICD-10-CM | POA: Diagnosis not present

## 2017-10-29 DIAGNOSIS — C7931 Secondary malignant neoplasm of brain: Secondary | ICD-10-CM | POA: Insufficient documentation

## 2017-10-29 DIAGNOSIS — Z86718 Personal history of other venous thrombosis and embolism: Secondary | ICD-10-CM | POA: Insufficient documentation

## 2017-10-29 MED ORDER — TRASTUZUMAB CHEMO 150 MG IV SOLR
750.0000 mg | Freq: Once | INTRAVENOUS | Status: AC
  Administered 2017-10-29: 750 mg via INTRAVENOUS
  Filled 2017-10-29: qty 35.72

## 2017-10-29 MED ORDER — SODIUM CHLORIDE 0.9 % IV SOLN
Freq: Once | INTRAVENOUS | Status: AC
  Administered 2017-10-29: 12:00:00 via INTRAVENOUS

## 2017-10-29 MED ORDER — DIPHENHYDRAMINE HCL 25 MG PO CAPS
50.0000 mg | ORAL_CAPSULE | Freq: Once | ORAL | Status: AC
  Administered 2017-10-29: 50 mg via ORAL

## 2017-10-29 MED ORDER — ACETAMINOPHEN 325 MG PO TABS
ORAL_TABLET | ORAL | Status: AC
  Filled 2017-10-29: qty 2

## 2017-10-29 MED ORDER — ACETAMINOPHEN 325 MG PO TABS
650.0000 mg | ORAL_TABLET | Freq: Once | ORAL | Status: AC
  Administered 2017-10-29: 650 mg via ORAL

## 2017-10-29 MED ORDER — DIPHENHYDRAMINE HCL 25 MG PO CAPS
ORAL_CAPSULE | ORAL | Status: AC
  Filled 2017-10-29: qty 2

## 2017-10-29 MED ORDER — HEPARIN SOD (PORK) LOCK FLUSH 100 UNIT/ML IV SOLN
500.0000 [IU] | Freq: Once | INTRAVENOUS | Status: AC | PRN
  Administered 2017-10-29: 500 [IU]

## 2017-10-29 MED ORDER — SODIUM CHLORIDE 0.9% FLUSH
10.0000 mL | INTRAVENOUS | Status: DC | PRN
  Administered 2017-10-29: 10 mL
  Filled 2017-10-29: qty 10

## 2017-10-29 NOTE — Assessment & Plan Note (Addendum)
Stage IV adenocarcinoma of breast, primary tumor never identified, ER positive, HER-2 positive, with metastatic disease to brain and bone.  She has status post multiple lines of therapy.  She is status post BSO by Dr. Gertie Fey on 08/27/2015.  Current therapy consist of Herceptin and Tykerb beginning on 07/23/2017 and she continues on bone targeted therapy with Zometa every 3 months to reduce SRE and its antitumor effect and bone.  Recent restaging scans including a PET scan and MRI of brain in June 2019 was negative for intracranial metastatic disease, stable and improved sclerotic bone lesions, and no evidence of progressive or new disease.  Port not providing blood return today.  Will forgo blood work today.  Labs work in 3 weeks: CBC diff, CMET, CA 15-3, and CA 27.29.  Lab work from 10/08/2017 is reviewed in detail: CBC diff, CMET.  I personally reviewed and went over laboratory results with the patient.  The results are noted within this dictation.  At that time, blood counts demonstrate a white blood cell count of 5.3 with normal differential, normocytic anemia at 9.7, and platelet count 234,000.  Anabolic panel was impressive for a hypocalcemia 8.6 however, hypoalbuminemia 3.1 was noted.  Therefore corrected calcium was 9.3 (within normal limits).  Hypokalemia was noted as well at 2.8.  Herceptin infusion today.  Will try Lomotil preemptively starting tonight or tomorrow AM to prevent herceptin-induced diarrhea.  She will take Lomotil 2 tablets 4 times daily for 48-72 hours.  After that, she will take on an as-needed basis.  If she develops diarrhea despite this, she will call us and we will work her in for IV fluids, nausea medications if needed, and/or anti-diarrheal.  If diarrhea is refractory to Lomotil, I would strongly consider octreotide injection(s) to abort episode(s).    If refractory diarrhea recurs with treatment, may need to consider permanent discontinuation of Herceptin.  Rx for Lomotil is  sent to pharmacy.  I personally reviewed and went over radiographic studies with the patient.  The results are noted within this dictation.  I personally reviewed the images in PACS.  PET scan on 10/18/2017 demonstrated stable to improved osseous disease without any new sites of disease or progressive disease.  I personally reviewed and went over radiographic studies with the patient.  The results are noted within this dictation.  I personally reviewed the images in PACS.  MRI of brain on 10/27/2017 was negative for intracranial metastatic disease.  I personally reviewed and went over radiographic studies with the patient.  The results are noted within this dictation.  I personally reviewed the images in PACS.  2D echocardiogram on 10/01/2017 demonstrated normal systolic function with ejection fraction of 60-65%.    Next 2D echocardiogram will be due in August 2019.  Order will play placed in the future accordingly.  She will move forward with Herceptin (high-risk medication) and Tykerb (high-risk medication) therapy today as planned.  Lab satisfy treatment parameters 3 weeks ago.  Return in 3 weeks for follow-up and to embark on next cycle of therapy with labs.

## 2017-10-29 NOTE — Patient Instructions (Signed)
[facility] Discharge Instructions for Patients Receiving Chemotherapy   Beginning January 23rd 2017 lab work for the [facility] will be done in the  Main lab at [facility] on 1st floor. If you have a lab appointment with the [place] please come in thru the  Main Entrance and check in at the main information desk   Today you received the following chemotherapy agents   To help prevent nausea and vomiting after your treatment, we encourage you to take your nausea med for the next {CHL    If you develop nausea and vomiting, or diarrhea that is not controlled by your medication, call the clinic.  The clinic phone number is [phone]. Office hours are Monday-Friday 8:30am-5:00pm.  BELOW ARE SYMPTOMS THAT SHOULD BE REPORTED IMMEDIATELY:  *FEVER GREATER THAN 101.0 F  *CHILLS WITH OR WITHOUT FEVER  NAUSEA AND VOMITING THAT IS NOT CONTROLLED WITH YOUR NAUSEA MEDICATION  *UNUSUAL SHORTNESS OF BREATH  *UNUSUAL BRUISING OR BLEEDING  TENDERNESS IN MOUTH AND THROAT WITH OR WITHOUT PRESENCE OF ULCERS  *URINARY PROBLEMS  *BOWEL PROBLEMS  UNUSUAL RASH Items with * indicate a potential emergency and should be followed up as soon as possible. If you have an emergency after office hours please contact your primary care physician or go to the nearest emergency department.  Please call the clinic during office hours if you have any questions or concerns.   You may also contact the Patient Navigator at [phone] should you have any questions or need assistance in obtaining follow up care.      Resources For Cancer Patients and their Caregivers ? American Cancer Society: Can assist with transportation, wigs, general needs, runs Look Good Feel Better.        706 867 6784 ? Cancer Care: Provides financial assistance, online support groups, medication/co-pay assistance.  1-800-813-HOPE (845)378-0971) ? [place] Assists [place]  cancer patients and their families through emotional , educational and financial support.  316-385-0735 ? [place] DSS Where to apply for food stamps, Medicaid and utility assistance. 949-113-6963 ? RCATS: Transportation to medical appointments. 973-107-3356 ? Social Security Administration: May apply for disability if have a Stage IV cancer. 713-013-4097 (248) 364-2925 ? [employer], Disability and Transit Services: Assists with nutrition, care and transit needs. 8104814237

## 2017-10-29 NOTE — Patient Instructions (Signed)
[name] at [facility] Discharge Instructions  Treatment today with Herceptin as planned. Tonight or tomorrow morning, start Lomotil, 2 tablets 4 times daily.  Do this for 2-3 days.  Then take as needed only. Please call us if you have uncontrolled diarrhea and/or nausea/vomiting.  We will be happy to bring you in for IV fluids and nausea medicine if needed.  If diarrhea is not controlled with Lomotil, we can try another medicine called octreotide. PET scan and MRI of brain performed in June/2019 are very good. Return in 3 weeks for follow-up and to embark on next cycle of Herceptin therapy. Continue Tykerb daily.      Thank you for choosing [place] at [facility] to provide your oncology and hematology care.  To afford each patient quality time with our provider, please arrive at least 15 minutes before your scheduled appointment time.   If you have a lab appointment with the [facility] please come in thru the  Main Entrance and check in at the main information desk  You need to re-schedule your appointment should you arrive 10 or more minutes late.  We strive to give you quality time with our providers, and arriving late affects you and other patients whose appointments are after yours.  Also, if you no show three or more times for appointments you may be dismissed from the clinic at the providers discretion.     Again, thank you for choosing [facility].  Our hope is that these requests will decrease the amount of time that you wait before being seen by our physicians.       _____________________________________________________________  Should you have questions after your visit to [facility], please contact our office at [phone] between the hours of 8:30 a.m. and 4:30 p.m.  Voicemails left after 4:30 p.m. will not be returned until the following business day.  For prescription refill requests, have  your pharmacy contact our office.       Resources For Cancer Patients and their Caregivers ? American Cancer Society: Can assist with transportation, wigs, general needs, runs Look Good Feel Better.        332-826-7227 ? Cancer Care: Provides financial assistance, online support groups, medication/co-pay assistance.  1-800-813-HOPE 681-212-6998) ? [place] Assists [place] cancer patients and their families through emotional , educational and financial support.  202-635-0517 ? [place] DSS Where to apply for food stamps, Medicaid and utility assistance. (703)532-8962 ? RCATS: Transportation to medical appointments. 215 436 5385 ? Social Security Administration: May apply for disability if have a Stage IV cancer. 914-586-6314 854-595-9356 ? [employer], Disability and Transit Services: Assists with nutrition, care and transit needs. [place] Support Programs:   > Cancer Support Group  2nd Tuesday of the month 1pm-2pm, Journey Room   > Creative Journey  3rd Tuesday of the month 1130am-1pm, Journey Room

## 2017-10-29 NOTE — Progress Notes (Signed)
t       [name], MD [name] [id number]  Malignant neoplasm of breast, stage 4, unspecified laterality ([place]) - Plan: CBC with Differential, Comprehensive metabolic panel  Bone metastases (HCC)  Drug-induced cardiomyopathy (HCC)  Brain metastases (HCC)  Anemia, unspecified type   HISTORY OF PRESENT ILLNESS: Stage IV adenocarcinoma of breast, ER+, HER2 + with metastatic disease to brain and bone. She is S/P multiple lines of therapy. Primary tumor never identified. She is S/P BSO by Dr. Gaetano Net on 08/27/2015. Current therapy is Herceptin + Tykerb beginning on 07/23/2017 and she continues on bone targeted therapy with Zometa every 3 months to reduce SRE and its antitumor effect in bone. Restaging PET scan on 10/18/2017 demonstrated diffuse sclerotic bone metastases with mild improvement noted at several sites without any new or progressive metastatic disease or evidence of soft tissue metastatic disease. Restaging MRI brain on 10/27/2017 was negative for intracranial disease.  CURRENT THERAPY: Herceptin, Tykerb, Zometa (every 3 months).  CURRENT STATUS: [name] 52 y.o. female returns for followup of in follow-up of stage IV adenocarcinoma of the breast, ER positive, HER-2 positive.  She currently on therapy with a good response to therapy.  Her PET scan and MRI of brain recently was very good.  No new intracranial metastatic disease noted.  Sclerotic lesions noted which are stable to improved in appearance.  She denies any new pain.  She denies any new lumps or bumps on her own examination.  She denies any cough or hemoptysis.  Her appetite is reasonable, but her weight is down approximately 5 pounds over the last 4 weeks or so.  She denies any current nausea or vomiting.  She denies any diarrhea currently.  She is nervous about restarting Herceptin giving her previous experience with her last cycle.  She reports tolerating her first 2 cycles of Herceptin well, but the  third cycle was significant complicated by diarrhea with hospitalization as result.  She denies any new neurological deficits including headaches, dizziness, double vision, LOC, and seizure.  Review of Systems  Constitutional: Negative.  Negative for chills, fever and weight loss.  HENT: Negative.   Eyes: Negative.   Respiratory: Negative.  Negative for cough.   Cardiovascular: Negative.  Negative for chest pain.  Gastrointestinal: Negative.  Negative for blood in stool, constipation, diarrhea, melena, nausea and vomiting.  Genitourinary: Negative.   Musculoskeletal: Negative.   Skin: Negative.   Neurological: Negative.  Negative for weakness.  Endo/Heme/Allergies: Negative.   Psychiatric/Behavioral: Negative.     Past Medical History:  Diagnosis Date  . Anticoagulated    xarelto  . Anxiety   . Breast cancer metastasized to multiple sites [facility])    liver, brain, and bone  . Breast cancer, stage 4 [facility]) oncologist-  dr Larene [place] ([facility])   dx 12/ 2015 -- breast cancer Stage 4,  ER/HER2 +,  w/  liver, brain and  bone mets/  chemotherapy and radiation therapy  . Chronic pain syndrome    secondary to cancer   . Depression   . Drug-induced cardiomyopathy ([place])    per last MUGA (08-08-2015), ef 56.5/ per last echo 05-18-2014 ef 60-65%  . Family history of prostate cancer   . GERD (gastroesophageal reflux disease)   . History of colon polyps    07-13-2013  benign  . History of DVT (deep vein thrombosis)    07-09-2014  upper right extremity-  RIJ and right subclavian--  resolved  .  History of gastritis    erosive  . History of pneumonia    HCAP 06-07-2015--  resolved per cxr 07-04-2015  . History of radiation therapy    12 fractions to L3 - S3, 30Gy and left spacula 20Gy (06-20-2014 to 07-10-2014) //  whole brain rxt (10-09-2014 to 10-26-2014)  . History of small bowel obstruction    S/P RESECTION 2008  . Migraine   . PONV (postoperative nausea and vomiting)     pt states scope patch does well    Past Surgical History:  Procedure Laterality Date  . BREAST REDUCTION SURGERY  03/17/2011   Procedure: MAMMARY REDUCTION BILATERAL (BREAST);  Surgeon: Mary A Contogiannis;  Location: [place];  Service: Plastics;  Laterality: Bilateral;  . CATARACT EXTRACTION W/ INTRAOCULAR LENS  IMPLANT, BILATERAL  2008  . CERVICAL FUSION  2003   C5 -- C6  . COLONOSCOPY N/A 07/13/2013   Procedure: COLONOSCOPY;  Surgeon: Rogene Houston, MD;  [facility];  Service: Endoscopy;  Laterality: N/A;  930  . COLONOSCOPY N/A 11/26/2014   Procedure: COLONOSCOPY;  Surgeon: Rogene Houston, MD;  [facility];  Service: Endoscopy;  Laterality: N/A;  730  . D & C HYSTEROSCOPY/  RESECTION ENDOMETRIAL MASS/  [place] ENDOMETRIAL ABLATION  04-11-2010  . DX LAPAROSCOPY W/ PARTIAL SMALL BOWEL RESECTION AND APPENDECTOMY  04-13-2007  . ESOPHAGOGASTRODUODENOSCOPY N/A 05/25/2014   Procedure: ESOPHAGOGASTRODUODENOSCOPY (EGD);  Surgeon: Rogene Houston, MD;  [facility];  Service: Endoscopy;  Laterality: N/A;  155  . KNEE ARTHROSCOPY Right 2005  . LAPAROSCOPIC ASSISTED VAGINAL HYSTERECTOMY  10-13-2010   w/ Bx Left Fallopian tube and Aspiration Right Ovarian Cyst  . LAPAROSCOPIC CHOLECYSTECTOMY  11-17-2002  . LAPAROSCOPIC SALPINGO OOPHERECTOMY Bilateral 08/26/2015   Procedure: LAPAROSCOPIC SALPINGO OOPHORECTOMY, bilateral;  Surgeon: Everlene Farrier, MD;  Location: [place];  Service: Gynecology;  Laterality: Bilateral;  . PORTACATH PLACEMENT  05-17-2014  . REDUCTION MAMMAPLASTY    . SHOULDER ARTHROSCOPY WITH ROTATOR CUFF REPAIR Right 2002  . TRANSTHORACIC ECHOCARDIOGRAM  05-18-2014   ef 60-65%//   last MUGA  (08-08-2015)  ef 56.6%      Family History  Problem Relation Age of Onset  . Diabetes Father   . Heart attack Maternal Grandmother 30       multiple over lifetime.  . Cancer Maternal Grandmother 74       NOS  . Prostate  cancer Maternal Grandfather        dx in his 6s  . Lung cancer Paternal Grandfather        dx <50  . Lymphoma Maternal Aunt        dx in her 75s  . Melanoma Cousin 79       maternal first cousin  . Brain cancer Cousin        paternal first cousin dx under 34  . Prostate cancer Other        MGF's father  . Colon cancer Other        MGM's mother    Social History   Socioeconomic History  . Marital status: Married    Spouse name: Not on file  . Number of children: Not on file  . Years of education: Not on file  . Highest education level: Not on file  Occupational History  . Not on file  Social Needs  . Financial resource strain: Not on file  . Food insecurity:    Worry: Not on  file    Inability: Not on file  . Transportation needs:    Medical: Not on file    Non-medical: Not on file  Tobacco Use  . Smoking status: Former Smoker    Types: Cigarettes    Last attempt to quit: 08/20/1994    Years since quitting: 23.2  . Smokeless tobacco: Never Used  Substance and Sexual Activity  . Alcohol use: Yes    Comment: Occasionally  . Drug use: No  . Sexual activity: Yes    Partners: Male    Birth control/protection: Surgical, Condom  Lifestyle  . Physical activity:    Days per week: Not on file    Minutes per session: Not on file  . Stress: Not on file  Relationships  . Social connections:    Talks on phone: Not on file    Gets together: Not on file    Attends religious service: Not on file    Active member of club or organization: Not on file    Attends meetings of clubs or organizations: Not on file    Relationship status: Not on file  Other Topics Concern  . Not on file  Social History Narrative  . Not on file     PHYSICAL EXAMINATION  ECOG PERFORMANCE STATUS: 1 - Symptomatic but completely ambulatory  Vitals - 1 value per visit 12/27/2991  SYSTOLIC 716  DIASTOLIC 54  Pulse 83  Temperature 97.7  Respirations 18  Weight (lb) 256.5   GENERAL:alert, no  distress, well nourished, well developed, comfortable, cooperative, obese, smiling and accompanied by husband, [name] SKIN: skin color, texture, turgor are normal, no rashes or significant lesions HEAD: Normocephalic, No masses, lesions, tenderness or abnormalities EYES: normal, EOMI, Conjunctiva are pink and non-injected EARS: External ears normal OROPHARYNX:lips, buccal mucosa, and tongue normal and mucous membranes are moist  NECK: supple, no adenopathy, trachea midline LYMPH:  no palpable lymphadenopathy, no hepatosplenomegaly BREAST:not examined LUNGS: clear to auscultation and percussion HEART: regular rate & rhythm, no murmurs and no gallops ABDOMEN:abdomen soft, non-tender, obese and normal bowel sounds BACK: Back symmetric, no curvature. EXTREMITIES:less then 2 second capillary refill, no joint deformities, effusion, or inflammation, no skin discoloration, no clubbing, no cyanosis  NEURO: alert & oriented x 3 with fluent speech, no focal motor/sensory deficits, gait normal   LABORATORY DATA: CBC    Component Value Date/Time   WBC 5.3 10/08/2017 1123   RBC 3.92 10/08/2017 1123   HGB 9.7 (L) 10/08/2017 1123   HCT 31.9 (L) 10/08/2017 1123   PLT 234 10/08/2017 1123   MCV 81.4 10/08/2017 1123   MCH 24.7 (L) 10/08/2017 1123   MCHC 30.4 10/08/2017 1123   RDW 16.6 (H) 10/08/2017 1123   LYMPHSABS 0.9 10/08/2017 1123   MONOABS 0.5 10/08/2017 1123   EOSABS 0.0 10/08/2017 1123   BASOSABS 0.0 10/08/2017 1123      Chemistry      Component Value Date/Time   NA 139 10/08/2017 1123   K 2.8 (L) 10/08/2017 1123   CL 99 (L) 10/08/2017 1123   CO2 32 10/08/2017 1123   BUN 9 10/08/2017 1123   CREATININE 0.88 10/08/2017 1123      Component Value Date/Time   CALCIUM 8.6 (L) 10/08/2017 1123   ALKPHOS 85 10/08/2017 1123   AST 22 10/08/2017 1123   ALT 18 10/08/2017 1123   BILITOT 0.5 10/08/2017 1123       RADIOGRAPHIC STUDIES:  Mr Jeri Cos RC Contrast  Result Date:  10/27/2017 CLINICAL DATA:  Weakness.  Metastatic breast cancer restaging. EXAM: MRI HEAD WITHOUT AND WITH CONTRAST TECHNIQUE: Multiplanar, multiecho pulse sequences of the brain and surrounding structures were obtained without and with intravenous contrast. CONTRAST:  63m MULTIHANCE GADOBENATE DIMEGLUMINE 529 MG/ML IV SOLN COMPARISON:  06/02/2017 FINDINGS: Brain: There is no evidence of acute infarct, midline shift, or extra-axial fluid collection. A mildly expanded partially empty sella is unchanged. Cerebral volume is unchanged and within normal limits for age. Cerebral white matter T2 hyperintensities are greatest in the periventricular regions and are unchanged from the prior study, nonspecific though may reflect the sequelae of prior radiation therapy and chronic small vessel ischemia. A chronic left frontoparietal parasagittal microhemorrhage is unchanged. A 4 mm nonenhancing T2 hyperintense focus in the right caudate head is unchanged and corresponds to a previously treated metastasis. A tiny nonenhancing T2 hyperintense focus inferiorly in the left cerebellum is less conspicuous than on the prior study. No enhancing brain lesions are identified. Vascular: Major intracranial vascular flow voids are preserved. Skull and upper cervical spine: Unremarkable bone marrow signal. Sinuses/Orbits: Bilateral cataract extraction. Paranasal sinuses and mastoid air cells are clear. Other: None. IMPRESSION: No evidence of recurrent intracranial metastatic disease or acute abnormality. Electronically Signed   By: ALogan BoresM.D.   On: 10/27/2017 14:29   Nm Pet Image Restag (ps) Skull Base To Thigh  Result Date: 10/18/2017 CLINICAL DATA:  Subsequent treatment strategy for metastatic right breast carcinoma. Recent chemotherapy. Previous history of radiation therapy. EXAM: NUCLEAR MEDICINE PET SKULL BASE TO THIGH TECHNIQUE: 10.7 mCi F-18 FDG was injected intravenously. Full-ring PET imaging was performed from the skull  base to thigh after the radiotracer. CT data was obtained and used for attenuation correction and anatomic localization. Fasting blood glucose: 97 mg/dl COMPARISON:  [facility] CT on 09/21/2017 and PET-CT on 03/24/2017 FINDINGS: (Reference/background mediastinal blood pool activity: SUV max = 3.2) NECK:  No hypermetabolic lymph nodes or masses. Incidental CT findings:  None. CHEST: No hypermetabolic masses or lymphadenopathy. No suspicious pulmonary nodules seen on CT images. Incidental CT findings:  None. ABDOMEN/PELVIS: No abnormal hypermetabolic activity within the liver, pancreas, adrenal glands, or spleen. No hypermetabolic lymph nodes in the abdomen or pelvis. Incidental CT findings: Hepatic cirrhosis again demonstrated. Prior cholecystectomy. Previous hysterectomy. SKELETON: Diffuse hypermetabolic sclerotic bone metastases are again seen. Some of these lesions show increased sclerosis and decreased FDG uptake. Example includes sclerotic metastasis in the T8 vertebral body, which has SUV max of 4.5 chronically, compared to 9.0 previously. Majority of bone metastases show no significant change in appearance or degree of hypermetabolic activity. No new or progressive bone metastases identified. Incidental CT findings:  None. IMPRESSION: Diffuse sclerotic bone metastases, with mild improvement noted at several sites. No new or progressive metastatic disease. No evidence of soft tissue metastatic disease. Electronically Signed   By: JEarle GellM.D.   On: 10/18/2017 18:34     PATHOLOGY:    ASSESSMENT AND PLAN:  Breast cancer, stage 4 (HCC) Stage IV adenocarcinoma of breast, primary tumor never identified, ER positive, HER-2 positive, with metastatic disease to brain and bone.  She has status post multiple lines of therapy.  She is status post BSO by Dr. TGertie Feyon 08/27/2015.  Current therapy consist of Herceptin and Tykerb beginning on 07/23/2017 and she continues on bone targeted therapy with Zometa every 3 months  to reduce SRE and its antitumor effect and bone.  Recent restaging scans including a PET scan and MRI of brain in June 2019 was negative for  intracranial metastatic disease, stable and improved sclerotic bone lesions, and no evidence of progressive or new disease.  Port not providing blood return today.  Will forgo blood work today.  Labs work in 3 weeks: CBC diff, CMET, CA 15-3, and CA 27.29.  Lab work from 10/08/2017 is reviewed in detail: CBC diff, CMET.  I personally reviewed and went over laboratory results with the patient.  The results are noted within this dictation.  At that time, blood counts demonstrate a white blood cell count of 5.3 with normal differential, normocytic anemia at 9.7, and platelet count 234,000.  Anabolic panel was impressive for a hypocalcemia 8.6 however, hypoalbuminemia 3.1 was noted.  Therefore corrected calcium was 9.3 (within normal limits).  Hypokalemia was noted as well at 2.8.  Herceptin infusion today.  Will try Lomotil preemptively starting tonight or tomorrow AM to prevent herceptin-induced diarrhea.  She will take Lomotil 2 tablets 4 times daily for 48-72 hours.  After that, she will take on an as-needed basis.  If she develops diarrhea despite this, she will call us and we will work her in for IV fluids, nausea medications if needed, and/or anti-diarrheal.  If diarrhea is refractory to Lomotil, I would strongly consider octreotide injection(s) to abort episode(s).    If refractory diarrhea recurs with treatment, may need to consider permanent discontinuation of Herceptin.  Rx for Lomotil is sent to pharmacy.  I personally reviewed and went over radiographic studies with the patient.  The results are noted within this dictation.  I personally reviewed the images in PACS.  PET scan on 10/18/2017 demonstrated stable to improved osseous disease without any new sites of disease or progressive disease.  I personally reviewed and went over radiographic studies with the  patient.  The results are noted within this dictation.  I personally reviewed the images in PACS.  MRI of brain on 10/27/2017 was negative for intracranial metastatic disease.  I personally reviewed and went over radiographic studies with the patient.  The results are noted within this dictation.  I personally reviewed the images in PACS.  2D echocardiogram on 10/01/2017 demonstrated normal systolic function with ejection fraction of 60-65%.    Next 2D echocardiogram will be due in August 2019.  Order will play placed in the future accordingly.  She will move forward with Herceptin (high-risk medication) and Tykerb (high-risk medication) therapy today as planned.  Lab satisfy treatment parameters 3 weeks ago.  Return in 3 weeks for follow-up and to embark on next cycle of therapy with labs.   2. Bone metastases ([place]) On Zometa therapy every 3 months.  Stable to improved according to PET scan in June 2019.  3. Drug-induced cardiomyopathy ([place]) History of Herceptin induced cardia myopathy resulting in a hold of Herceptin in the past.  She is now back on Herceptin and is at increased risk for development of cardiomyopathy.  4. Brain metastases [facility]) MRI brain June 2019 demonstrates remission of disease from an intracranial standpoint.  5. Anemia, unspecified type New, will follow moving forward.  May need to consider vitamin studies in near future.   ORDERS PLACED FOR THIS ENCOUNTER: Orders Placed This Encounter  Procedures  . CBC with Differential  . Comprehensive metabolic panel    MEDICATIONS PRESCRIBED THIS ENCOUNTER: No orders of the defined types were placed in this encounter.   THERAPY PLAN:  Continue Herceptin, Tykerb, and Zometa as outlined above.  All questions were answered. The patient knows to call the clinic with any problems, questions  or concerns. We can certainly see the patient much sooner if necessary.  Patient and plan discussed with Dr. Derek Jack and  she is in agreement with the aforementioned.   This note is electronically signed by: Robynn Pane, PA-C 10/29/2017 11:20 AM

## 2017-10-29 NOTE — Progress Notes (Signed)
Herceptin today , no labs needed per Kirby Crigler PA.   Treatment given per orders. Patient tolerated it well without problems. Vitals stable and discharged home from clinic ambulatory. Follow up as scheduled.

## 2017-10-29 NOTE — Treatment Plan (Signed)
Kirby Crigler PA said to leave at 6 mg /kg and not to re-load as patient had diarrhea and was held as a result.

## 2017-10-31 ENCOUNTER — Other Ambulatory Visit (HOSPITAL_COMMUNITY): Payer: Self-pay | Admitting: Hematology

## 2017-11-01 ENCOUNTER — Other Ambulatory Visit (HOSPITAL_COMMUNITY): Payer: Self-pay | Admitting: *Deleted

## 2017-11-01 MED ORDER — LAPATINIB DITOSYLATE 250 MG PO TABS
1000.0000 mg | ORAL_TABLET | Freq: Every day | ORAL | 0 refills | Status: DC
Start: 2017-11-01 — End: 2017-11-23

## 2017-11-05 ENCOUNTER — Other Ambulatory Visit (HOSPITAL_COMMUNITY): Payer: Self-pay | Admitting: Hematology

## 2017-11-05 ENCOUNTER — Telehealth (HOSPITAL_COMMUNITY): Payer: Self-pay | Admitting: *Deleted

## 2017-11-05 DIAGNOSIS — R5382 Chronic fatigue, unspecified: Secondary | ICD-10-CM

## 2017-11-05 DIAGNOSIS — C50919 Malignant neoplasm of unspecified site of unspecified female breast: Secondary | ICD-10-CM

## 2017-11-05 DIAGNOSIS — C229 Malignant neoplasm of liver, not specified as primary or secondary: Secondary | ICD-10-CM

## 2017-11-05 DIAGNOSIS — Z0000 Encounter for general adult medical examination without abnormal findings: Secondary | ICD-10-CM

## 2017-11-05 DIAGNOSIS — C7951 Secondary malignant neoplasm of bone: Secondary | ICD-10-CM

## 2017-11-05 MED ORDER — MORPHINE SULFATE ER 30 MG PO TBCR: EXTENDED_RELEASE_TABLET | ORAL | 0 refills | Status: DC

## 2017-11-05 NOTE — Telephone Encounter (Signed)
Printed to give to Dr. Katragadda. 

## 2017-11-09 ENCOUNTER — Other Ambulatory Visit (HOSPITAL_COMMUNITY): Payer: Self-pay | Admitting: *Deleted

## 2017-11-09 DIAGNOSIS — C50919 Malignant neoplasm of unspecified site of unspecified female breast: Secondary | ICD-10-CM

## 2017-11-09 DIAGNOSIS — Z0000 Encounter for general adult medical examination without abnormal findings: Secondary | ICD-10-CM

## 2017-11-09 DIAGNOSIS — R5382 Chronic fatigue, unspecified: Secondary | ICD-10-CM

## 2017-11-09 DIAGNOSIS — C229 Malignant neoplasm of liver, not specified as primary or secondary: Secondary | ICD-10-CM

## 2017-11-09 DIAGNOSIS — C7951 Secondary malignant neoplasm of bone: Secondary | ICD-10-CM

## 2017-11-09 MED ORDER — LORAZEPAM 0.5 MG PO TABS: 0.5000 mg | ORAL_TABLET | Freq: Four times a day (QID) | ORAL | 2 refills | Status: DC | PRN

## 2017-11-16 ENCOUNTER — Other Ambulatory Visit (HOSPITAL_COMMUNITY): Payer: Self-pay | Admitting: *Deleted

## 2017-11-16 DIAGNOSIS — C50919 Malignant neoplasm of unspecified site of unspecified female breast: Secondary | ICD-10-CM

## 2017-11-17 MED ORDER — DICYCLOMINE HCL 20 MG PO TABS: 20.0000 mg | ORAL_TABLET | Freq: Three times a day (TID) | ORAL | 0 refills | Status: AC

## 2017-11-17 MED ORDER — DIPHENOXYLATE-ATROPINE 2.5-0.025 MG PO TABS: 2.0000 | ORAL_TABLET | Freq: Four times a day (QID) | ORAL | 1 refills | Status: DC | PRN

## 2017-11-19 ENCOUNTER — Inpatient Hospital Stay (HOSPITAL_COMMUNITY): Payer: Medicare Other

## 2017-11-19 ENCOUNTER — Encounter (HOSPITAL_COMMUNITY): Payer: Self-pay | Admitting: Hematology

## 2017-11-19 ENCOUNTER — Inpatient Hospital Stay (HOSPITAL_COMMUNITY): Payer: Medicare Other | Attending: Adult Health | Admitting: Hematology

## 2017-11-19 ENCOUNTER — Other Ambulatory Visit: Payer: Self-pay

## 2017-11-19 VITALS — BP 121/56 | HR 82 | Temp 98.4°F | Resp 16 | Wt 267.0 lb

## 2017-11-19 VITALS — BP 118/68 | HR 63 | Temp 97.8°F | Resp 18

## 2017-11-19 DIAGNOSIS — Z8042 Family history of malignant neoplasm of prostate: Secondary | ICD-10-CM | POA: Diagnosis not present

## 2017-11-19 DIAGNOSIS — I429 Cardiomyopathy, unspecified: Secondary | ICD-10-CM | POA: Insufficient documentation

## 2017-11-19 DIAGNOSIS — Z86718 Personal history of other venous thrombosis and embolism: Secondary | ICD-10-CM | POA: Diagnosis not present

## 2017-11-19 DIAGNOSIS — Z801 Family history of malignant neoplasm of trachea, bronchus and lung: Secondary | ICD-10-CM

## 2017-11-19 DIAGNOSIS — C50919 Malignant neoplasm of unspecified site of unspecified female breast: Secondary | ICD-10-CM

## 2017-11-19 DIAGNOSIS — Z87891 Personal history of nicotine dependence: Secondary | ICD-10-CM | POA: Diagnosis not present

## 2017-11-19 DIAGNOSIS — Z90722 Acquired absence of ovaries, bilateral: Secondary | ICD-10-CM | POA: Insufficient documentation

## 2017-11-19 DIAGNOSIS — Z809 Family history of malignant neoplasm, unspecified: Secondary | ICD-10-CM | POA: Insufficient documentation

## 2017-11-19 DIAGNOSIS — Z170 Estrogen receptor positive status [ER+]: Secondary | ICD-10-CM | POA: Insufficient documentation

## 2017-11-19 DIAGNOSIS — C787 Secondary malignant neoplasm of liver and intrahepatic bile duct: Secondary | ICD-10-CM | POA: Diagnosis not present

## 2017-11-19 DIAGNOSIS — C7931 Secondary malignant neoplasm of brain: Secondary | ICD-10-CM | POA: Insufficient documentation

## 2017-11-19 DIAGNOSIS — F418 Other specified anxiety disorders: Secondary | ICD-10-CM | POA: Insufficient documentation

## 2017-11-19 DIAGNOSIS — K219 Gastro-esophageal reflux disease without esophagitis: Secondary | ICD-10-CM | POA: Diagnosis not present

## 2017-11-19 DIAGNOSIS — Z806 Family history of leukemia: Secondary | ICD-10-CM | POA: Diagnosis not present

## 2017-11-19 DIAGNOSIS — Z9221 Personal history of antineoplastic chemotherapy: Secondary | ICD-10-CM | POA: Diagnosis not present

## 2017-11-19 DIAGNOSIS — C7951 Secondary malignant neoplasm of bone: Secondary | ICD-10-CM | POA: Diagnosis not present

## 2017-11-19 DIAGNOSIS — Z86010 Personal history of colonic polyps: Secondary | ICD-10-CM | POA: Insufficient documentation

## 2017-11-19 DIAGNOSIS — Z90710 Acquired absence of both cervix and uterus: Secondary | ICD-10-CM | POA: Diagnosis not present

## 2017-11-19 DIAGNOSIS — Z923 Personal history of irradiation: Secondary | ICD-10-CM | POA: Diagnosis not present

## 2017-11-19 DIAGNOSIS — Z5112 Encounter for antineoplastic immunotherapy: Secondary | ICD-10-CM | POA: Diagnosis not present

## 2017-11-19 DIAGNOSIS — R600 Localized edema: Secondary | ICD-10-CM

## 2017-11-19 LAB — CBC WITH DIFFERENTIAL/PLATELET
BASOS ABS: 0 10*3/uL (ref 0.0–0.1)
BASOS PCT: 0 %
Eosinophils Absolute: 0 10*3/uL (ref 0.0–0.7)
Eosinophils Relative: 0 %
HEMATOCRIT: 32.7 % — AB (ref 36.0–46.0)
HEMOGLOBIN: 9.9 g/dL — AB (ref 12.0–15.0)
Lymphocytes Relative: 12 %
Lymphs Abs: 0.9 10*3/uL (ref 0.7–4.0)
MCH: 25.1 pg — ABNORMAL LOW (ref 26.0–34.0)
MCHC: 30.3 g/dL (ref 30.0–36.0)
MCV: 82.8 fL (ref 78.0–100.0)
Monocytes Absolute: 0.5 10*3/uL (ref 0.1–1.0)
Monocytes Relative: 7 %
NEUTROS ABS: 5.8 10*3/uL (ref 1.7–7.7)
NEUTROS PCT: 81 %
Platelets: 263 10*3/uL (ref 150–400)
RBC: 3.95 MIL/uL (ref 3.87–5.11)
RDW: 17.3 % — ABNORMAL HIGH (ref 11.5–15.5)
WBC: 7.2 10*3/uL (ref 4.0–10.5)

## 2017-11-19 LAB — COMPREHENSIVE METABOLIC PANEL
ALK PHOS: 92 U/L (ref 38–126)
ALT: 14 U/L (ref 0–44)
ANION GAP: 10 (ref 5–15)
AST: 20 U/L (ref 15–41)
Albumin: 3.5 g/dL (ref 3.5–5.0)
BILIRUBIN TOTAL: 0.5 mg/dL (ref 0.3–1.2)
BUN: 10 mg/dL (ref 6–20)
CALCIUM: 9.1 mg/dL (ref 8.9–10.3)
CO2: 30 mmol/L (ref 22–32)
CREATININE: 1.04 mg/dL — AB (ref 0.44–1.00)
Chloride: 98 mmol/L (ref 98–111)
GFR, EST NON AFRICAN AMERICAN: 60 mL/min — AB (ref >60)
Glucose, Bld: 116 mg/dL — ABNORMAL HIGH (ref 70–99)
Potassium: 3.6 mmol/L (ref 3.5–5.1)
SODIUM: 138 mmol/L (ref 135–145)
Total Protein: 7.1 g/dL (ref 6.5–8.1)

## 2017-11-19 MED ORDER — DIPHENHYDRAMINE HCL 25 MG PO CAPS
50.0000 mg | ORAL_CAPSULE | Freq: Once | ORAL | Status: AC
  Administered 2017-11-19: 50 mg via ORAL

## 2017-11-19 MED ORDER — ACETAMINOPHEN 325 MG PO TABS
650.0000 mg | ORAL_TABLET | Freq: Once | ORAL | Status: AC
  Administered 2017-11-19: 650 mg via ORAL

## 2017-11-19 MED ORDER — SODIUM CHLORIDE 0.9 % IV SOLN
750.0000 mg | Freq: Once | INTRAVENOUS | Status: AC
  Administered 2017-11-19: 750 mg via INTRAVENOUS
  Filled 2017-11-19: qty 35.72

## 2017-11-19 MED ORDER — SODIUM CHLORIDE 0.9 % IV SOLN
Freq: Once | INTRAVENOUS | Status: AC
Start: 2017-11-19 — End: 2017-11-19
  Administered 2017-11-19: 11:00:00 via INTRAVENOUS

## 2017-11-19 MED ORDER — ACETAMINOPHEN 325 MG PO TABS
ORAL_TABLET | ORAL | Status: AC
  Filled 2017-11-19: qty 2

## 2017-11-19 MED ORDER — HEPARIN SOD (PORK) LOCK FLUSH 100 UNIT/ML IV SOLN
500.0000 [IU] | Freq: Once | INTRAVENOUS | Status: AC | PRN
  Administered 2017-11-19: 500 [IU]

## 2017-11-19 MED ORDER — SODIUM CHLORIDE 0.9% FLUSH
10.0000 mL | INTRAVENOUS | Status: DC | PRN
  Administered 2017-11-19: 10 mL
  Filled 2017-11-19: qty 10

## 2017-11-19 MED ORDER — DIPHENHYDRAMINE HCL 25 MG PO CAPS
ORAL_CAPSULE | ORAL | Status: AC
  Filled 2017-11-19: qty 2

## 2017-11-19 MED ORDER — FUROSEMIDE 20 MG PO TABS: 20.0000 mg | ORAL_TABLET | Freq: Every day | ORAL | 2 refills | Status: DC

## 2017-11-19 NOTE — Assessment & Plan Note (Signed)
1.  Metastatic HER-2 positive breast cancer to the bones and brain: - Patient took Xeloda and Tykerb for over a year, Xeloda discontinued secondary to poor tolerance. - Herceptin and Tykerb 1000 milligrams daily started on 07/23/2017, tolerating very well. Last echocardiogram on 10/01/2017 shows EF of 60 to 65%. - She reportedly had diarrhea lasting about 2 weeks after her last Herceptin 3 weeks ago.  She went to the emergency room on 09/21/2017 and received fluids.  A CT scan of the abdomen was done which showed 3.3 cm mass in the posterior aspect of the right hepatic lobe which mildly decreased in size.  Stable bone lesions.  New sclerosis involving T10 body.  I have discussed these findings with the patient and her husband in detail. -MRI of the brain on 10/27/2017 did not show any metastasis.  PET/CT scan on 10/18/2017 shows diffuse bone mets with mild improvement noted at several sites with no new lesions. -She did not have any diarrhea after her last Herceptin as she proactively took Lomotil.  She will proceed with her next dose of Herceptin today and in 3 weeks.  I will see her back in 6 weeks for follow-up.  2.  Bone metastasis: We have switched her Zometa to once every 3 months.  Last Zometa was on 09/17/2017.  She will continue calcium and vitamin D supplements.  3.  Lower extremity swelling: She is on spironolactone 25 mg daily.  It is not helping her.  We will switch her back to Lasix 20 mg daily.  She was told to continue potassium 20 mg daily.

## 2017-11-19 NOTE — Progress Notes (Signed)
Patient to treatment room for herceptin and lab work after oncology follow up visit.  May treat without labs completed per Dr. Delton Coombes.  Patient tolerated herceptin with no complaints voiced.  Port site clean and dry with no bruising or swelling noted at site.  Good blood return noted before and after administration of therapy.  Band aid applied.  VSS with discharge and left ambulatory with no s/s of distress noted.

## 2017-11-19 NOTE — Progress Notes (Signed)
[address], [place] [address]   CLINIC:  Medical Oncology/Hematology  PCP:  [name], MD [place] [phone]   REASON FOR VISIT:  Follow-up for metastatic breast cancer, HER-2 POSTIVE  CURRENT THERAPY: Herceptin and tykerb  BRIEF ONCOLOGIC HISTORY:    Breast cancer, stage 4 ([place])   04/25/2014 Initial Diagnosis    Breast cancer, stage 4      04/25/2014 Imaging    CT abdomen/pelvis with widespread metastatic disease to the liver, multiple lytic lesions throughout spine and pelvis. No FX or epidural tumor identified      04/26/2014 Imaging    CT head unremarkable      04/26/2014 Imaging    CT chest with no lung mass or pulmonary nodules, no adenopathy. Lytic bone lesions, right 2nd rib      04/27/2014 Initial Biopsy    U/S guided liver biopsy, lesion in anterior and inferior left hepatic lobe biopsied      04/27/2014 Pathology Results    Metastatic adenocarcinoma, CK7, ER+, patchy positivity with PR. Possible primary includes breast, less likely gynecologic      05/15/2014 Mammogram    BI-RADS CATEGORY  2: Benign Finding(s)      05/16/2014 PET scan    1. Intensely hypermetabolic hepatic metastasis. 2. Widespread hypermetabolic skeletal lesions. 3. No primary adenocarcinoma identified by FDG PET imaging.      05/19/2014 Imaging    MUGA- Left ventricular ejection fracture greater than 70%.      05/21/2014 Breast MRI    No suspicious masses or enhancement within the breasts. No axillary adenopathy.      05/22/2014 - 07/03/2014 Antibody Plan    Herceptin/Perjeta/Tamoxifen      06/12/2014 - 07/03/2014 Chemotherapy    Taxotere added secondary to persistent abdominal and back pain      06/17/2014 - 06/19/2014 Hospital Admission    Neutropenia, fever, diarrhea, nausea, vomiting      06/20/2014 - 07/10/2014 Radiation Therapy    Dr. Thea Silversmith 12 fractions to L3-S3 (30 Gy) and left scapula (20 Gy).       07/03/2014 Adverse Reaction    Perjeta- induced diarrhea.  Perjeta discontinued      07/16/2014 - 07/20/2014 Hospital Admission    Electrolyte abnormalities, and diarrhea.  Suspect Perjeta-induced diarrhea.  Negative GI work-up.      07/24/2014 - 08/19/2015 Chemotherapy    Herceptin/Tamoxifen/Xgeva      08/21/2014 Imaging    MUGA- Left ventricular ejection fraction equals 71%.      08/24/2014 PET scan    Dramatic reduction in metabolic activity of the widespread liver metastasis. Liver metastasis now have metabolic activity equal to background normal liver activity. Liver has a nodular contour. Marked reduction in metabolic activity of skeletal lesions..      10/05/2014 Progression    Widespread metastatic disease to the brain as described. Between 20 and 30 intracranial metastatic deposits are now seen. No midline shift or incipient herniation      10/09/2014 - 10/26/2014 Radiation Therapy    Whole Brain XRT      11/14/2014 Imaging    MUGA- LVEF 67%      02/13/2015 Imaging    MUGA- LVEF 59%      02/15/2015 Treatment Plan Change    Due to declining LVEF, will hold Herceptin per PI guidelines.      04/12/2015 -  Chemotherapy    Herceptin restarted  06/02/2015 - 06/08/2015 Hospital Admission    Pneumonia      07/05/2015 Progression     PET/CT concern for mild progression of skeletal metastasis with several lesions within the spine and 1 lesion in the Left iliac wing with mild to moderate metabolic activity new from prior. Rising CA 27-29      07/16/2015 - 10/23/2015 Anti-estrogen oral therapy    Arimidex      07/16/2015 Imaging    MRI brain with satisfactory post treatment apperance of brain. interval resolved enhancing R caudate metastasis, minimal punctate residual enhancing metastatic disease at the inferior L cerebellum. No new metastatic disease or new intracranial abnormality      07/19/2015 Treatment Plan Change    Discontinue Tamoxifen, Zoladex plus Arimidex.        08/27/2015 Procedure    Laparoscopic bilateral salpingo-oophorectomy by Dr. Gaetano Net      10/17/2015 PET scan    Osseous metastatic disease appears slightly progressive based on a new right scapular lesion and increased uptake within lesions in the thoracic spine, left iliac wing and  proximal right femur.      10/17/2015 Progression    Slight progression on PET scan imaging.      10/18/2015 Imaging    REsolved enhancing metastatic disease to the brain status post WBXRT      10/23/2015 - 02/12/2016 Adjuvant Chemotherapy    Faslodex loading followed by maintenance dose.  (Herceptin continued)      11/04/2015 Imaging    MUGA- LEFT ventricular ejection fraction 51% slightly decreased in a 57% on the previous exam.      12/24/2015 Treatment Plan Change    Zometa every 28 days.  Xgeva discontinued.        01/01/2016 Imaging    MUGA- Left ventricular ejection fraction equals 57.9%. This is increased from 51.1% previously.      02/03/2016 PET scan    1. Mixed metabolic changes in the scattered hypermetabolic sclerotic osseous metastases throughout the axial and proximal appendicular skeleton as detailed above. 2. No new sites of hypermetabolic metastatic disease. Stable pseudo-cirrhotic appearance of the liver due to treated liver metastases with no hypermetabolic liver metastases.       02/03/2016 Progression    PET shows mixed osseous response.  Some lesions more hypermetabolic, others improved.      02/05/2016 Treatment Plan Change    D/C Herceptin.  Faslodex as scheduled on 02/12/2016, then discontinued.  Continue Zometa.      02/05/2016 Treatment Plan Change    Prescriptions for Xeloda 7 days on and 7 days off and Tykerb printed and provided for authorization.      02/19/2016 -  Chemotherapy    Xeloda 2300 mg BID 7 days on and 7 days off and Tykerb       02/24/2016 Treatment Plan Change    Xeloda dose reduced by 10% to 2000 mg BID week on and week off.      03/18/2016  Treatment Plan Change    Xeloda dose reduced to 2000 mg in AM and 1500 mg in PM 7 days on and 7 days off.      03/23/2016 Imaging    MUGA- Normal LEFT ventricular ejection fraction of 56% not significantly changed from 58% on previous exam.      05/19/2016 Imaging    MRI brain- . No new focus of abnormal enhancement to suggest interval metastatic disease. 2. No acute intracranial abnormality. 3. Stable foci of T2 FLAIR hyperintensity in white matter and  in the right caudate head compatible with posttreatment changes and treated metastasis.      05/20/2016 PET scan    1. The multiple osseous metastatic lesions shown to be hypermetabolic on the prior exam are reduced in activity or even resolved in hypermetabolic activity compared to prior, as detailed above. There are also numerous sclerotic bony lesions wedge are not currently and were not previously hypermetabolic, compatible with old non active lesions. 2. No findings of extra osseous metastatic disease currently. 3. Hepatic pseudocirrhosis. 4. Healing rib fractures. Chronic pathologic fracture the left acromion.      07/08/2016 Imaging    MUGA- Low normal LEFT ventricular ejection fraction of 53%, little changed since the 56% on 03/23/2016 but slightly decreased from the 58% on 01/01/2016.      09/07/2016 Echocardiogram    MUGA- Normal LEFT ventricular ejection fraction of 59% with normal LV wall motion.      09/07/2016 Treatment Plan Change    Xeloda dose reduced to 1500 mg BID 7 days on and 7 days off.      09/15/2016 PET scan    No significant change in diffuse sclerotic bone metastases on CT images, although several show mildly increased FDG uptake since prior study.  No evidence of soft tissue metastatic disease.      11/02/2016 Imaging    MRI brain- No change from the prior study. Previously noted metastatic deposits have been treated. No new lesions identified.      07/23/2017 -  Chemotherapy    Herceptin  + Tykerb        CANCER STAGING: Cancer Staging Breast cancer, stage 4 ([place]) Staging form: Breast, AJCC 7th Edition - Clinical stage from 05/27/2014: Stage IV (T0, N0, M1) - Signed by Baird Cancer, PA-C on 05/27/2014    INTERVAL HISTORY:  Ms. Forbis 52 y.o. female returns for routine follow-up for metastatic breast cancer and consideration for next cycle of chemotherapy. Patient is here today with her husband. Patient did well with her last treatment. She had  No diarrhea this time. She took the lomotil starting the day of treatment and it prevented the diarrhea this time. He is having bilateral ankle and foot swelling. She wished to go back on the lasix because it worked better for her. She will continue taking her potassium with the lasix. Patient is SOB with exertion. This is stable and has not got any worse. Her right inner elbow has been hurting and left shoulder has been hurting since starting treatment. No swelling or redness. Denies any recent fevers or infections. Denies nausea or vomiting.  Overall, she tells me she has been feeling pretty well. Energy levels are low at 25%; her appetite is 75%. She feels ready for treatment today.   REVIEW OF SYSTEMS:  Review of Systems  Constitutional: Positive for fatigue.  HENT:  Negative.   Eyes: Negative.   Respiratory: Positive for shortness of breath.   Cardiovascular: Positive for leg swelling.  Gastrointestinal: Negative.   Endocrine: Negative.   Genitourinary: Negative.    Musculoskeletal:       Right elbow Left shoulder  Skin: Negative.   Neurological: Positive for extremity weakness.  Hematological: Negative.   Psychiatric/Behavioral: Negative.      PAST MEDICAL/SURGICAL HISTORY:  Past Medical History:  Diagnosis Date  . Anticoagulated    xarelto  . Anxiety   . Breast cancer metastasized to multiple sites [facility])    liver, brain, and bone  . Breast cancer, stage 4 [facility]) oncologist-  dr Larene [place] ([facility])   dx 12/ 2015 -- breast cancer Stage 4,  ER/HER2 +,  w/  liver, brain and  bone mets/  chemotherapy and radiation therapy  . Chronic pain syndrome    secondary to cancer   . Depression   . Drug-induced cardiomyopathy ([place])    per last MUGA (08-08-2015), ef 56.5/ per last echo 05-18-2014 ef 60-65%  . Family history of prostate cancer   . GERD (gastroesophageal reflux disease)   . History of colon polyps    07-13-2013  benign  . History of DVT (deep vein thrombosis)    07-09-2014  upper right extremity-  RIJ and right subclavian--  resolved  . History of gastritis    erosive  . History of pneumonia    HCAP 06-07-2015--  resolved per cxr 07-04-2015  . History of radiation therapy    12 fractions to L3 - S3, 30Gy and left spacula 20Gy (06-20-2014 to 07-10-2014) //  whole brain rxt (10-09-2014 to 10-26-2014)  . History of small bowel obstruction    S/P RESECTION 2008  . Migraine   . PONV (postoperative nausea and vomiting)    pt states scope patch does well   Past Surgical History:  Procedure Laterality Date  . BREAST REDUCTION SURGERY  03/17/2011   Procedure: MAMMARY REDUCTION BILATERAL (BREAST);  Surgeon: Mary A Contogiannis;  Location: [place];  Service: Plastics;  Laterality: Bilateral;  . CATARACT EXTRACTION W/ INTRAOCULAR LENS  IMPLANT, BILATERAL  2008  . CERVICAL FUSION  2003   C5 -- C6  . COLONOSCOPY N/A 07/13/2013   Procedure: COLONOSCOPY;  Surgeon: Rogene Houston, MD;  [facility];  Service: Endoscopy;  Laterality: N/A;  930  . COLONOSCOPY N/A 11/26/2014   Procedure: COLONOSCOPY;  Surgeon: Rogene Houston, MD;  [facility];  Service: Endoscopy;  Laterality: N/A;  730  . D & C HYSTEROSCOPY/  RESECTION ENDOMETRIAL MASS/  [place] ENDOMETRIAL ABLATION  04-11-2010  . DX LAPAROSCOPY W/ PARTIAL SMALL BOWEL RESECTION AND APPENDECTOMY  04-13-2007  . ESOPHAGOGASTRODUODENOSCOPY N/A 05/25/2014   Procedure: ESOPHAGOGASTRODUODENOSCOPY  (EGD);  Surgeon: Rogene Houston, MD;  [facility];  Service: Endoscopy;  Laterality: N/A;  155  . KNEE ARTHROSCOPY Right 2005  . LAPAROSCOPIC ASSISTED VAGINAL HYSTERECTOMY  10-13-2010   w/ Bx Left Fallopian tube and Aspiration Right Ovarian Cyst  . LAPAROSCOPIC CHOLECYSTECTOMY  11-17-2002  . LAPAROSCOPIC SALPINGO OOPHERECTOMY Bilateral 08/26/2015   Procedure: LAPAROSCOPIC SALPINGO OOPHORECTOMY, bilateral;  Surgeon: Everlene Farrier, MD;  Location: [place];  Service: Gynecology;  Laterality: Bilateral;  . PORTACATH PLACEMENT  05-17-2014  . REDUCTION MAMMAPLASTY    . SHOULDER ARTHROSCOPY WITH ROTATOR CUFF REPAIR Right 2002  . TRANSTHORACIC ECHOCARDIOGRAM  05-18-2014   ef 60-65%//   last MUGA  (08-08-2015)  ef 56.6%       SOCIAL HISTORY:  Social History   Socioeconomic History  . Marital status: Married    Spouse name: Not on file  . Number of children: Not on file  . Years of education: Not on file  . Highest education level: Not on file  Occupational History  . Not on file  Social Needs  . Financial resource strain: Not on file  . Food insecurity:    Worry: Not on file    Inability: Not on file  . Transportation needs:    Medical: Not on file    Non-medical: Not on file  Tobacco Use  .  Smoking status: Former Smoker    Types: Cigarettes    Last attempt to quit: 08/20/1994    Years since quitting: 23.2  . Smokeless tobacco: Never Used  Substance and Sexual Activity  . Alcohol use: Yes    Comment: Occasionally  . Drug use: No  . Sexual activity: Yes    Partners: Male    Birth control/protection: Surgical, Condom  Lifestyle  . Physical activity:    Days per week: Not on file    Minutes per session: Not on file  . Stress: Not on file  Relationships  . Social connections:    Talks on phone: Not on file    Gets together: Not on file    Attends religious service: Not on file    Active member of club or organization: Not on file    Attends  meetings of clubs or organizations: Not on file    Relationship status: Not on file  . Intimate partner violence:    Fear of current or ex partner: Not on file    Emotionally abused: Not on file    Physically abused: Not on file    Forced sexual activity: Not on file  Other Topics Concern  . Not on file  Social History Narrative  . Not on file    FAMILY HISTORY:  Family History  Problem Relation Age of Onset  . Diabetes Father   . Heart attack Maternal Grandmother 30       multiple over lifetime.  . Cancer Maternal Grandmother 61       NOS  . Prostate cancer Maternal Grandfather        dx in his 56s  . Lung cancer Paternal Grandfather        dx <50  . Lymphoma Maternal Aunt        dx in her 41s  . Melanoma Cousin 66       maternal first cousin  . Brain cancer Cousin        paternal first cousin dx under 40  . Prostate cancer Other        MGF's father  . Colon cancer Other        MGM's mother    CURRENT MEDICATIONS:  Outpatient Encounter Medications as of 11/19/2017  Medication Sig  . acetaminophen (TYLENOL) 325 MG tablet Take 650 mg by mouth every 6 (six) hours as needed for moderate pain or headache.  . calcium carbonate (TUMS - DOSED IN MG ELEMENTAL CALCIUM) 500 MG chewable tablet Chew 2 tablets by mouth daily.   . Calcium-Phosphorus-Vitamin D (CALCIUM/D3 ADULT GUMMIES PO) Take 2 tablets by mouth 2 (two) times daily. dosage of calcium 1230m and vitamin d 10022m . desvenlafaxine (PRISTIQ) 100 MG 24 hr tablet Take 1 tablet (100 mg total) by mouth daily.  . Marland Kitchenicyclomine (BENTYL) 20 MG tablet Take 1 tablet (20 mg total) by mouth 4 (four) times daily -  before meals and at bedtime.  . diphenhydrAMINE (BENADRYL) 25 mg capsule Take 25 mg by mouth at bedtime as needed for itching.   . diphenoxylate-atropine (LOMOTIL) 2.5-0.025 MG tablet Take 2 tablets by mouth 4 (four) times daily as needed for diarrhea or loose stools.  . DUREZOL 0.05 % EMUL INSTILL ONE DROP IN THE RIGHT EYE  TWICE DAILY TIME ONE WEEK THEN ONCE DAILY  . gabapentin (NEURONTIN) 300 MG capsule TAKE TWO CAPSULES BY MOUTH AT BEDTIME  . lapatinib (TYKERB) 250 MG tablet Take 4 tablets (1,000 mg total) by mouth  daily.  . lidocaine-prilocaine (EMLA) cream Apply 1 application topically daily as needed (apply to port before chemo).  . LORazepam (ATIVAN) 0.5 MG tablet Take 1 tablet (0.5 mg total) by mouth every 6 (six) hours as needed. for anxiety  . Melatonin 10 MG CAPS Take 1 tablet by mouth at bedtime. Pt states she takes one tablet at night for sleep   . morphine (MS CONTIN) 30 MG 12 hr tablet TAKE 1 TABLET BY MOUTH EVERY TWELVE HOURS  . NARCAN 4 MG/0.1ML LIQD nasal spray kit CALL 911. ADMINISTER A SINGLE SPRAY OF NARCAN IN ONE NOSTRIL, REPEAT EVERY 3 MINUTES AS NEEDED IF NO OR MINIMAL RESPONSE  . ondansetron (ZOFRAN) 4 MG tablet Take 1 tablet (4 mg total) by mouth every 8 (eight) hours as needed for nausea or vomiting.  . ondansetron (ZOFRAN) 8 MG tablet TAKE ONE TABLET BY MOUTH EVERY 8 HOURS AS NEEDED FOR NAUSEA AND VOMITING  . oxyCODONE (OXY IR/ROXICODONE) 5 MG immediate release tablet Take 1-2 tablets (5-10 mg total) by mouth every 4 (four) hours as needed for moderate pain.  . pantoprazole (PROTONIX) 40 MG tablet TAKE ONE TABLET BY MOUTH EVERY TWELVE HOURS  . potassium chloride SA (K-DUR,KLOR-CON) 20 MEQ tablet Take 1 tablet (20 mEq total) by mouth daily.  . promethazine (PHENERGAN) 25 MG tablet TAKE 1 TABLET BY MOUTH EVERY SIX HOURS AS NEEDED FOR NAUSEA OR VOMITING  . rivaroxaban (XARELTO) 20 MG TABS tablet TAKE 1 TABLET BY MOUTH EVERY DAY WITH SUPPER  . spironolactone (ALDACTONE) 25 MG tablet Take 1 tablet (25 mg total) by mouth daily.  . traZODone (DESYREL) 50 MG tablet Take 1-2 tablets at bedtime for sleep  . Zoledronic Acid (ZOMETA IV) Inject into the vein every 3 (three) months.  . zolpidem (AMBIEN) 10 MG tablet Take 1 tablet (10 mg total) by mouth at bedtime as needed for sleep.  . furosemide  (LASIX) 20 MG tablet Take 1 tablet (20 mg total) by mouth daily.   No facility-administered encounter medications on file as of 11/19/2017.     ALLERGIES:  No Known Allergies   PHYSICAL EXAM:  ECOG Performance status: 1  Vitals:   11/19/17 0944  BP: (!) 121/56  Pulse: 82  Resp: 16  Temp: 98.4 F (36.9 C)  SpO2: 97%   Filed Weights   11/19/17 0944  Weight: 267 lb (121.1 kg)    Physical Exam   LABORATORY DATA:  I have reviewed the labs as listed.  CBC    Component Value Date/Time   WBC 7.2 11/19/2017 1028   RBC 3.95 11/19/2017 1028   HGB 9.9 (L) 11/19/2017 1028   HCT 32.7 (L) 11/19/2017 1028   PLT 263 11/19/2017 1028   MCV 82.8 11/19/2017 1028   MCH 25.1 (L) 11/19/2017 1028   MCHC 30.3 11/19/2017 1028   RDW 17.3 (H) 11/19/2017 1028   LYMPHSABS 0.9 11/19/2017 1028   MONOABS 0.5 11/19/2017 1028   EOSABS 0.0 11/19/2017 1028   BASOSABS 0.0 11/19/2017 1028   CMP Latest Ref Rng & Units 11/19/2017 10/08/2017 09/20/2017  Glucose 70 - 99 mg/dL 116(H) 122(H) 136(H)  BUN 6 - 20 mg/dL '10 9 15  ' Creatinine 0.44 - 1.00 mg/dL 1.04(H) 0.88 1.13(H)  Sodium 135 - 145 mmol/L 138 139 135  Potassium 3.5 - 5.1 mmol/L 3.6 2.8(L) 3.4(L)  Chloride 98 - 111 mmol/L 98 99(L) 99(L)  CO2 22 - 32 mmol/L 30 32 24  Calcium 8.9 - 10.3 mg/dL 9.1 8.6(L) 9.6  Total  Protein 6.5 - 8.1 g/dL 7.1 6.4(L) 8.4(H)  Total Bilirubin 0.3 - 1.2 mg/dL 0.5 0.5 0.6  Alkaline Phos 38 - 126 U/L 92 85 121  AST 15 - 41 U/L '20 22 26  ' ALT 0 - 44 U/L '14 18 21       ' DIAGNOSTIC IMAGING:  I have reviewed her MRI of the brain dated 10/27/2017 and PET CT scan images from 10/18/2017 and discussed with the patient.    ASSESSMENT & PLAN:   Breast cancer, stage 4 ([place]) 1.  Metastatic HER-2 positive breast cancer to the bones and brain: - Patient took Xeloda and Tykerb for over a year, Xeloda discontinued secondary to poor tolerance. - Herceptin and Tykerb 1000 milligrams daily started on 07/23/2017, tolerating very  well. Last echocardiogram on 10/01/2017 shows EF of 60 to 65%. - She reportedly had diarrhea lasting about 2 weeks after her last Herceptin 3 weeks ago.  She went to the emergency room on 09/21/2017 and received fluids.  A CT scan of the abdomen was done which showed 3.3 cm mass in the posterior aspect of the right hepatic lobe which mildly decreased in size.  Stable bone lesions.  New sclerosis involving T10 body.  I have discussed these findings with the patient and her husband in detail. -MRI of the brain on 10/27/2017 did not show any metastasis.  PET/CT scan on 10/18/2017 shows diffuse bone mets with mild improvement noted at several sites with no new lesions. -She did not have any diarrhea after her last Herceptin as she proactively took Lomotil.  She will proceed with her next dose of Herceptin today and in 3 weeks.  I will see her back in 6 weeks for follow-up.  2.  Bone metastasis: We have switched her Zometa to once every 3 months.  Last Zometa was on 09/17/2017.  She will continue calcium and vitamin D supplements.  3.  Lower extremity swelling: She is on spironolactone 25 mg daily.  It is not helping her.  We will switch her back to Lasix 20 mg daily.  She was told to continue potassium 20 mg daily.      Orders placed this encounter:  Orders Placed This Encounter  Procedures  . CBC with Differential/Platelet  . CBC with Differential/Platelet  . Comprehensive metabolic panel  . ECHOCARDIOGRAM COMPLETE      Derek Jack, [place] [phone]

## 2017-11-19 NOTE — Patient Instructions (Signed)
[facility] Discharge Instructions for Patients Receiving Chemotherapy  Today you received the following chemotherapy agents herceptin.    If you develop nausea and vomiting that is not controlled by your nausea medication, call the clinic.   BELOW ARE SYMPTOMS THAT SHOULD BE REPORTED IMMEDIATELY:  *FEVER GREATER THAN 100.5 F  *CHILLS WITH OR WITHOUT FEVER  NAUSEA AND VOMITING THAT IS NOT CONTROLLED WITH YOUR NAUSEA MEDICATION  *UNUSUAL SHORTNESS OF BREATH  *UNUSUAL BRUISING OR BLEEDING  TENDERNESS IN MOUTH AND THROAT WITH OR WITHOUT PRESENCE OF ULCERS  *URINARY PROBLEMS  *BOWEL PROBLEMS  UNUSUAL RASH Items with * indicate a potential emergency and should be followed up as soon as possible.  Feel free to call the clinic should you have any questions or concerns. The clinic phone number is [phone].  Please show the CHEMO ALERT CARD at check-in to the Emergency Department and triage nurse.   

## 2017-11-20 LAB — CANCER ANTIGEN 27.29: CAN 27.29: 80.1 U/mL — AB (ref 0.0–38.6)

## 2017-11-20 LAB — CANCER ANTIGEN 15-3: CA 15-3: 70.1 U/mL — ABNORMAL HIGH (ref 0.0–25.0)

## 2017-11-23 ENCOUNTER — Other Ambulatory Visit (HOSPITAL_COMMUNITY): Payer: Self-pay | Admitting: *Deleted

## 2017-11-23 MED ORDER — LAPATINIB DITOSYLATE 250 MG PO TABS: 1000.0000 mg | ORAL_TABLET | Freq: Every day | ORAL | 0 refills | Status: DC

## 2017-11-23 NOTE — Telephone Encounter (Signed)
Chart reviewed and per Dr. Tomie China last office note, Tykerb refilled.

## 2017-11-24 ENCOUNTER — Encounter (HOSPITAL_COMMUNITY): Payer: Self-pay | Admitting: *Deleted

## 2017-11-26 ENCOUNTER — Ambulatory Visit (HOSPITAL_COMMUNITY): Payer: Medicare Other | Attending: Nurse Practitioner

## 2017-12-01 ENCOUNTER — Other Ambulatory Visit (HOSPITAL_COMMUNITY): Payer: Self-pay | Admitting: *Deleted

## 2017-12-01 ENCOUNTER — Encounter (HOSPITAL_COMMUNITY): Payer: Self-pay | Admitting: *Deleted

## 2017-12-01 DIAGNOSIS — R5382 Chronic fatigue, unspecified: Secondary | ICD-10-CM

## 2017-12-01 DIAGNOSIS — C229 Malignant neoplasm of liver, not specified as primary or secondary: Secondary | ICD-10-CM

## 2017-12-01 DIAGNOSIS — C7951 Secondary malignant neoplasm of bone: Secondary | ICD-10-CM

## 2017-12-01 DIAGNOSIS — C50919 Malignant neoplasm of unspecified site of unspecified female breast: Secondary | ICD-10-CM

## 2017-12-01 DIAGNOSIS — Z0000 Encounter for general adult medical examination without abnormal findings: Secondary | ICD-10-CM

## 2017-12-01 MED ORDER — MORPHINE SULFATE ER 30 MG PO TBCR: EXTENDED_RELEASE_TABLET | ORAL | 0 refills | Status: DC

## 2017-12-02 DIAGNOSIS — H35352 Cystoid macular degeneration, left eye: Secondary | ICD-10-CM | POA: Diagnosis not present

## 2017-12-02 DIAGNOSIS — H43813 Vitreous degeneration, bilateral: Secondary | ICD-10-CM | POA: Diagnosis not present

## 2017-12-02 DIAGNOSIS — H43822 Vitreomacular adhesion, left eye: Secondary | ICD-10-CM | POA: Diagnosis not present

## 2017-12-02 DIAGNOSIS — H34831 Tributary (branch) retinal vein occlusion, right eye, with macular edema: Secondary | ICD-10-CM | POA: Diagnosis not present

## 2017-12-10 ENCOUNTER — Inpatient Hospital Stay (HOSPITAL_COMMUNITY): Payer: Medicare Other | Attending: Adult Health

## 2017-12-10 ENCOUNTER — Encounter (HOSPITAL_COMMUNITY): Payer: Self-pay

## 2017-12-10 ENCOUNTER — Ambulatory Visit (HOSPITAL_COMMUNITY): Payer: Self-pay

## 2017-12-10 VITALS — BP 123/56 | HR 68 | Temp 98.2°F | Resp 18 | Wt 262.0 lb

## 2017-12-10 DIAGNOSIS — C787 Secondary malignant neoplasm of liver and intrahepatic bile duct: Secondary | ICD-10-CM | POA: Insufficient documentation

## 2017-12-10 DIAGNOSIS — F418 Other specified anxiety disorders: Secondary | ICD-10-CM | POA: Insufficient documentation

## 2017-12-10 DIAGNOSIS — C7931 Secondary malignant neoplasm of brain: Secondary | ICD-10-CM | POA: Diagnosis not present

## 2017-12-10 DIAGNOSIS — Z8042 Family history of malignant neoplasm of prostate: Secondary | ICD-10-CM | POA: Insufficient documentation

## 2017-12-10 DIAGNOSIS — Z90710 Acquired absence of both cervix and uterus: Secondary | ICD-10-CM | POA: Diagnosis not present

## 2017-12-10 DIAGNOSIS — Z9221 Personal history of antineoplastic chemotherapy: Secondary | ICD-10-CM | POA: Insufficient documentation

## 2017-12-10 DIAGNOSIS — K219 Gastro-esophageal reflux disease without esophagitis: Secondary | ICD-10-CM | POA: Insufficient documentation

## 2017-12-10 DIAGNOSIS — Z86718 Personal history of other venous thrombosis and embolism: Secondary | ICD-10-CM | POA: Insufficient documentation

## 2017-12-10 DIAGNOSIS — R51 Headache: Secondary | ICD-10-CM | POA: Insufficient documentation

## 2017-12-10 DIAGNOSIS — Z923 Personal history of irradiation: Secondary | ICD-10-CM | POA: Insufficient documentation

## 2017-12-10 DIAGNOSIS — C7951 Secondary malignant neoplasm of bone: Secondary | ICD-10-CM | POA: Diagnosis not present

## 2017-12-10 DIAGNOSIS — E876 Hypokalemia: Secondary | ICD-10-CM | POA: Insufficient documentation

## 2017-12-10 DIAGNOSIS — Z86010 Personal history of colonic polyps: Secondary | ICD-10-CM | POA: Insufficient documentation

## 2017-12-10 DIAGNOSIS — Z5112 Encounter for antineoplastic immunotherapy: Secondary | ICD-10-CM | POA: Diagnosis not present

## 2017-12-10 DIAGNOSIS — E785 Hyperlipidemia, unspecified: Secondary | ICD-10-CM | POA: Insufficient documentation

## 2017-12-10 DIAGNOSIS — C50919 Malignant neoplasm of unspecified site of unspecified female breast: Secondary | ICD-10-CM

## 2017-12-10 DIAGNOSIS — Z90722 Acquired absence of ovaries, bilateral: Secondary | ICD-10-CM | POA: Diagnosis not present

## 2017-12-10 DIAGNOSIS — Z7901 Long term (current) use of anticoagulants: Secondary | ICD-10-CM | POA: Insufficient documentation

## 2017-12-10 DIAGNOSIS — Z170 Estrogen receptor positive status [ER+]: Secondary | ICD-10-CM | POA: Diagnosis not present

## 2017-12-10 MED ORDER — SODIUM CHLORIDE 0.9% FLUSH
10.0000 mL | INTRAVENOUS | Status: DC | PRN
  Administered 2017-12-10: 10 mL
  Filled 2017-12-10: qty 10

## 2017-12-10 MED ORDER — DIPHENHYDRAMINE HCL 25 MG PO CAPS
50.0000 mg | ORAL_CAPSULE | Freq: Once | ORAL | Status: AC
  Administered 2017-12-10: 50 mg via ORAL
  Filled 2017-12-10: qty 2

## 2017-12-10 MED ORDER — SODIUM CHLORIDE 0.9 % IV SOLN
Freq: Once | INTRAVENOUS | Status: AC
  Administered 2017-12-10: 11:00:00 via INTRAVENOUS

## 2017-12-10 MED ORDER — ZOLEDRONIC ACID 4 MG/100ML IV SOLN
4.0000 mg | Freq: Once | INTRAVENOUS | Status: AC
  Administered 2017-12-10: 4 mg via INTRAVENOUS
  Filled 2017-12-10: qty 100

## 2017-12-10 MED ORDER — ACETAMINOPHEN 325 MG PO TABS
650.0000 mg | ORAL_TABLET | Freq: Once | ORAL | Status: AC
  Administered 2017-12-10: 650 mg via ORAL
  Filled 2017-12-10: qty 2

## 2017-12-10 MED ORDER — TRASTUZUMAB CHEMO 150 MG IV SOLR
750.0000 mg | Freq: Once | INTRAVENOUS | Status: AC
  Administered 2017-12-10: 750 mg via INTRAVENOUS
  Filled 2017-12-10: qty 35.72

## 2017-12-10 MED ORDER — HEPARIN SOD (PORK) LOCK FLUSH 100 UNIT/ML IV SOLN
500.0000 [IU] | Freq: Once | INTRAVENOUS | Status: AC | PRN
  Administered 2017-12-10: 500 [IU]
  Filled 2017-12-10: qty 5

## 2017-12-10 NOTE — Progress Notes (Signed)
1110 Dr. Delton Coombes approved Zometa infusion today using Calcium of 9.1 from 11/19/17. Pt continues to take her Calcium and Tykerb as prescribed without issues Pt complained of urinary frequency with some mild bladder discomfort at times but she started  Pyridium yesterday and Dr. Delton Coombes recommended to continue it to see if it is effective  Pt verbalized understanding                                                                        [name] tolerated Herceptin and Zometa infusions well without complaints or incident. Pt denied any tooth or jaw pain and no recent or future dental visits prior to administering the Zometa. VSS upon discharge. Pt discharged self ambulatory in satisfactory condition accompanied by her husband

## 2017-12-10 NOTE — Patient Instructions (Signed)
[facility] Discharge Instructions for Patients Receiving Chemotherapy   Beginning January 23rd 2017 lab work for the [facility] will be done in the  Main lab at [facility] on 1st floor. If you have a lab appointment with the [facility] please come in thru the  Main Entrance and check in at the main information desk   Today you received the following chemotherapy agents Herceptin as well as Zometa infusion. Follow-up as scheduled. Call clinic for any questions or concerns  To help prevent nausea and vomiting after your treatment, we encourage you to take your nausea medication   If you develop nausea and vomiting, or diarrhea that is not controlled by your medication, call the clinic.  The clinic phone number is [phone]. Office hours are Monday-Friday 8:30am-5:00pm.  BELOW ARE SYMPTOMS THAT SHOULD BE REPORTED IMMEDIATELY:  *FEVER GREATER THAN 101.0 F  *CHILLS WITH OR WITHOUT FEVER  NAUSEA AND VOMITING THAT IS NOT CONTROLLED WITH YOUR NAUSEA MEDICATION  *UNUSUAL SHORTNESS OF BREATH  *UNUSUAL BRUISING OR BLEEDING  TENDERNESS IN MOUTH AND THROAT WITH OR WITHOUT PRESENCE OF ULCERS  *URINARY PROBLEMS  *BOWEL PROBLEMS  UNUSUAL RASH Items with * indicate a potential emergency and should be followed up as soon as possible. If you have an emergency after office hours please contact your primary care physician or go to the nearest emergency department.  Please call the clinic during office hours if you have any questions or concerns.   You may also contact the Patient Navigator at [phone] should you have any questions or need assistance in obtaining follow up care.      Resources For Cancer Patients and their Caregivers ? American Cancer Society: Can assist with transportation, wigs, general needs, runs Look Good Feel Better.        (934)612-1207 ? Cancer Care: Provides financial assistance, online support groups, medication/co-pay  assistance.  1-800-813-HOPE (434) 001-7719) ? [place] Assists [place] cancer patients and their families through emotional , educational and financial support.  (276)831-6992 ? [place] DSS Where to apply for food stamps, Medicaid and utility assistance. 814-546-7565 ? [employer]: Transportation to medical appointments. 936-764-7963 ? Social Security Administration: May apply for disability if have a Stage IV cancer. [phone] 6057589913 ? [employer], Disability and Transit Services: Assists with nutrition, care and transit needs. [phone]

## 2017-12-16 DIAGNOSIS — M25512 Pain in left shoulder: Secondary | ICD-10-CM | POA: Diagnosis not present

## 2017-12-20 ENCOUNTER — Other Ambulatory Visit (HOSPITAL_COMMUNITY): Payer: Self-pay | Admitting: Hematology

## 2017-12-23 ENCOUNTER — Other Ambulatory Visit (HOSPITAL_COMMUNITY): Payer: Self-pay | Admitting: *Deleted

## 2017-12-23 MED ORDER — LAPATINIB DITOSYLATE 250 MG PO TABS: 1000.0000 mg | ORAL_TABLET | Freq: Every day | ORAL | 0 refills | Status: DC

## 2017-12-23 NOTE — Telephone Encounter (Signed)
Chart reviewed, tykerb refilled

## 2017-12-31 ENCOUNTER — Inpatient Hospital Stay (HOSPITAL_BASED_OUTPATIENT_CLINIC_OR_DEPARTMENT_OTHER): Payer: Medicare Other | Admitting: Internal Medicine

## 2017-12-31 ENCOUNTER — Ambulatory Visit (HOSPITAL_COMMUNITY): Payer: Self-pay | Admitting: Hematology

## 2017-12-31 ENCOUNTER — Inpatient Hospital Stay (HOSPITAL_COMMUNITY): Payer: Medicare Other

## 2017-12-31 ENCOUNTER — Encounter (HOSPITAL_COMMUNITY): Payer: Self-pay | Admitting: Internal Medicine

## 2017-12-31 ENCOUNTER — Ambulatory Visit (HOSPITAL_COMMUNITY): Payer: Self-pay

## 2017-12-31 VITALS — BP 118/54 | HR 61 | Temp 98.2°F | Resp 18

## 2017-12-31 VITALS — BP 135/61 | HR 72 | Temp 98.0°F | Resp 16 | Wt 263.4 lb

## 2017-12-31 DIAGNOSIS — C787 Secondary malignant neoplasm of liver and intrahepatic bile duct: Secondary | ICD-10-CM | POA: Diagnosis not present

## 2017-12-31 DIAGNOSIS — Z8042 Family history of malignant neoplasm of prostate: Secondary | ICD-10-CM

## 2017-12-31 DIAGNOSIS — C7951 Secondary malignant neoplasm of bone: Secondary | ICD-10-CM | POA: Diagnosis not present

## 2017-12-31 DIAGNOSIS — Z86718 Personal history of other venous thrombosis and embolism: Secondary | ICD-10-CM

## 2017-12-31 DIAGNOSIS — E785 Hyperlipidemia, unspecified: Secondary | ICD-10-CM | POA: Diagnosis not present

## 2017-12-31 DIAGNOSIS — C50919 Malignant neoplasm of unspecified site of unspecified female breast: Secondary | ICD-10-CM | POA: Diagnosis not present

## 2017-12-31 DIAGNOSIS — Z90710 Acquired absence of both cervix and uterus: Secondary | ICD-10-CM

## 2017-12-31 DIAGNOSIS — K219 Gastro-esophageal reflux disease without esophagitis: Secondary | ICD-10-CM | POA: Diagnosis not present

## 2017-12-31 DIAGNOSIS — Z923 Personal history of irradiation: Secondary | ICD-10-CM

## 2017-12-31 DIAGNOSIS — Z170 Estrogen receptor positive status [ER+]: Secondary | ICD-10-CM | POA: Diagnosis not present

## 2017-12-31 DIAGNOSIS — C7931 Secondary malignant neoplasm of brain: Secondary | ICD-10-CM | POA: Diagnosis not present

## 2017-12-31 DIAGNOSIS — R51 Headache: Secondary | ICD-10-CM | POA: Diagnosis not present

## 2017-12-31 DIAGNOSIS — Z90722 Acquired absence of ovaries, bilateral: Secondary | ICD-10-CM

## 2017-12-31 DIAGNOSIS — Z7901 Long term (current) use of anticoagulants: Secondary | ICD-10-CM

## 2017-12-31 DIAGNOSIS — Z86010 Personal history of colonic polyps: Secondary | ICD-10-CM | POA: Diagnosis not present

## 2017-12-31 DIAGNOSIS — E876 Hypokalemia: Secondary | ICD-10-CM

## 2017-12-31 DIAGNOSIS — F418 Other specified anxiety disorders: Secondary | ICD-10-CM

## 2017-12-31 DIAGNOSIS — Z5112 Encounter for antineoplastic immunotherapy: Secondary | ICD-10-CM

## 2017-12-31 DIAGNOSIS — Z9221 Personal history of antineoplastic chemotherapy: Secondary | ICD-10-CM

## 2017-12-31 LAB — COMPREHENSIVE METABOLIC PANEL
ALBUMIN: 3.6 g/dL (ref 3.5–5.0)
ALT: 22 U/L (ref 0–44)
ANION GAP: 8 (ref 5–15)
AST: 27 U/L (ref 15–41)
Alkaline Phosphatase: 97 U/L (ref 38–126)
BILIRUBIN TOTAL: 0.6 mg/dL (ref 0.3–1.2)
BUN: 6 mg/dL (ref 6–20)
CALCIUM: 9 mg/dL (ref 8.9–10.3)
CO2: 30 mmol/L (ref 22–32)
Chloride: 102 mmol/L (ref 98–111)
Creatinine, Ser: 0.96 mg/dL (ref 0.44–1.00)
GFR calc Af Amer: >60 mL/min (ref >60)
GLUCOSE: 120 mg/dL — AB (ref 70–99)
Potassium: 3.3 mmol/L — ABNORMAL LOW (ref 3.5–5.1)
Sodium: 140 mmol/L (ref 135–145)
TOTAL PROTEIN: 6.8 g/dL (ref 6.5–8.1)

## 2017-12-31 LAB — CBC WITH DIFFERENTIAL/PLATELET
BASOS ABS: 0 10*3/uL (ref 0.0–0.1)
BASOS PCT: 0 %
EOS PCT: 0 %
Eosinophils Absolute: 0 10*3/uL (ref 0.0–0.7)
HEMATOCRIT: 33.6 % — AB (ref 36.0–46.0)
Hemoglobin: 10.1 g/dL — ABNORMAL LOW (ref 12.0–15.0)
Lymphocytes Relative: 12 %
Lymphs Abs: 0.8 10*3/uL (ref 0.7–4.0)
MCH: 24.6 pg — ABNORMAL LOW (ref 26.0–34.0)
MCHC: 30.1 g/dL (ref 30.0–36.0)
MCV: 82 fL (ref 78.0–100.0)
MONO ABS: 0.4 10*3/uL (ref 0.1–1.0)
Monocytes Relative: 6 %
NEUTROS ABS: 5.3 10*3/uL (ref 1.7–7.7)
Neutrophils Relative %: 82 %
PLATELETS: 235 10*3/uL (ref 150–400)
RBC: 4.1 MIL/uL (ref 3.87–5.11)
RDW: 17.4 % — AB (ref 11.5–15.5)
WBC: 6.5 10*3/uL (ref 4.0–10.5)

## 2017-12-31 MED ORDER — SODIUM CHLORIDE 0.9 % IV SOLN
Freq: Once | INTRAVENOUS | Status: AC
  Administered 2017-12-31: 12:00:00 via INTRAVENOUS

## 2017-12-31 MED ORDER — SODIUM CHLORIDE 0.9% FLUSH
10.0000 mL | INTRAVENOUS | Status: DC | PRN
  Administered 2017-12-31: 10 mL
  Filled 2017-12-31: qty 10

## 2017-12-31 MED ORDER — DIPHENHYDRAMINE HCL 25 MG PO CAPS
50.0000 mg | ORAL_CAPSULE | Freq: Once | ORAL | Status: AC
  Administered 2017-12-31: 50 mg via ORAL

## 2017-12-31 MED ORDER — DIPHENHYDRAMINE HCL 25 MG PO CAPS
ORAL_CAPSULE | ORAL | Status: AC
  Filled 2017-12-31: qty 2

## 2017-12-31 MED ORDER — ACETAMINOPHEN 325 MG PO TABS
ORAL_TABLET | ORAL | Status: AC
  Filled 2017-12-31: qty 2

## 2017-12-31 MED ORDER — POTASSIUM CHLORIDE 10 MEQ/100ML IV SOLN
10.0000 meq | INTRAVENOUS | Status: AC
  Administered 2017-12-31 (×2): 10 meq via INTRAVENOUS
  Filled 2017-12-31 (×2): qty 100

## 2017-12-31 MED ORDER — TRASTUZUMAB CHEMO 150 MG IV SOLR
750.0000 mg | Freq: Once | INTRAVENOUS | Status: AC
  Administered 2017-12-31: 750 mg via INTRAVENOUS
  Filled 2017-12-31: qty 35.72

## 2017-12-31 MED ORDER — HEPARIN SOD (PORK) LOCK FLUSH 100 UNIT/ML IV SOLN
500.0000 [IU] | Freq: Once | INTRAVENOUS | Status: AC | PRN
  Administered 2017-12-31: 500 [IU]

## 2017-12-31 MED ORDER — ACETAMINOPHEN 325 MG PO TABS
650.0000 mg | ORAL_TABLET | Freq: Once | ORAL | Status: AC
  Administered 2017-12-31: 650 mg via ORAL

## 2017-12-31 NOTE — Patient Instructions (Signed)
[facility] Discharge Instructions  You saw Dr. Higgs today.   Thank you for choosing [facility] at [facility] to provide your oncology and hematology care.  To afford each patient quality time with our provider, please arrive at least 15 minutes before your scheduled appointment time.   If you have a lab appointment with the Cancer Center please come in thru the  Main Entrance and check in at the main information desk  You need to re-schedule your appointment should you arrive 10 or more minutes late.  We strive to give you quality time with our providers, and arriving late affects you and other patients whose appointments are after yours.  Also, if you no show three or more times for appointments you may be dismissed from the clinic at the providers discretion.     Again, thank you for choosing [facility].  Our hope is that these requests will decrease the amount of time that you wait before being seen by our physicians.       _____________________________________________________________  Should you have questions after your visit to [facility], please contact our office at [phone] between the hours of 8:00 a.m. and 4:30 p.m.  Voicemails left after 4:00 p.m. will not be returned until the following business day.  For prescription refill requests, have your pharmacy contact our office and allow 72 hours.    Cancer Center Support Programs:   > Cancer Support Group  2nd Tuesday of the month 1pm-2pm, Journey Room    

## 2017-12-31 NOTE — Progress Notes (Signed)
1150 Labs reviewed with and pt seen by Dr. Walden Field and pt approved for Herceptin infusion per MD as well as Potassium 20 meq IV x 1 dose for K+ of 3.3. MD aware that last Echo was 5/24.         [name] tolerated Herceptin and potassium infusions well without complaints or incident. VSS upon discharge. Pt discharged self ambulatory in satisfactory condition accompanied by her husband

## 2017-12-31 NOTE — Patient Instructions (Signed)
[facility] Discharge Instructions for Patients Receiving Chemotherapy   Beginning January 23rd 2017 lab work for the [facility] will be done in the  Main lab at [facility] on 1st floor. If you have a lab appointment with the [facility] please come in thru the  Main Entrance and check in at the main information desk   Today you received the following chemotherapy agents Herceptin as well as Potassium infusion. Follow-up as scheduled. Call clinic for any questions or concerns  To help prevent nausea and vomiting after your treatment, we encourage you to take your nausea medication   If you develop nausea and vomiting, or diarrhea that is not controlled by your medication, call the clinic.  The clinic phone number is [phone]. Office hours are Monday-Friday 8:30am-5:00pm.  BELOW ARE SYMPTOMS THAT SHOULD BE REPORTED IMMEDIATELY:  *FEVER GREATER THAN 101.0 F  *CHILLS WITH OR WITHOUT FEVER  NAUSEA AND VOMITING THAT IS NOT CONTROLLED WITH YOUR NAUSEA MEDICATION  *UNUSUAL SHORTNESS OF BREATH  *UNUSUAL BRUISING OR BLEEDING  TENDERNESS IN MOUTH AND THROAT WITH OR WITHOUT PRESENCE OF ULCERS  *URINARY PROBLEMS  *BOWEL PROBLEMS  UNUSUAL RASH Items with * indicate a potential emergency and should be followed up as soon as possible. If you have an emergency after office hours please contact your primary care physician or go to the nearest emergency department.  Please call the clinic during office hours if you have any questions or concerns.   You may also contact the Patient Navigator at [phone] should you have any questions or need assistance in obtaining follow up care.      Resources For Cancer Patients and their Caregivers ? American Cancer Society: Can assist with transportation, wigs, general needs, runs Look Good Feel Better.        740-358-2258 ? Cancer Care: Provides financial assistance, online support groups, medication/co-pay  assistance.  1-800-813-HOPE 216-166-8575) ? [place] Assists [place] cancer patients and their families through emotional , educational and financial support.  320-494-8108 ? [place] DSS Where to apply for food stamps, Medicaid and utility assistance. (754) 682-1909 ? [employer]: Transportation to medical appointments. 805-064-9131 ? Social Security Administration: May apply for disability if have a Stage IV cancer. 913-566-2933 978-670-9177 ? [employer], Disability and Transit Services: Assists with nutrition, care and transit needs. 212-832-9668

## 2017-12-31 NOTE — Progress Notes (Signed)
Diagnosis Malignant neoplasm of breast, stage 4, unspecified laterality ([place]) - Plan: CBC with Differential/Platelet, Comprehensive metabolic panel, Lactate dehydrogenase, ECHOCARDIOGRAM COMPLETE  Staging Cancer Staging Breast cancer, stage 4 (HCC) Staging form: Breast, AJCC 7th Edition - Clinical stage from 05/27/2014: Stage IV (T0, N0, M1) - Signed by Baird Cancer, PA-C on 05/27/2014   Assessment and Plan:  1.  52 yr old female with Stage IV adenocarcinoma of breast, ER+, HER2 + with metastatic disease to brain and bone. She is S/P multiple lines of therapy. Primary tumor never identified. She is S/P BSO by Dr. Gaetano Net on 08/27/2015. Current therapy is Herceptin + Tykerb beginning on 07/23/2017 and she continues on bone targeted therapy with Zometa every 3 months to reduce SRE and its antitumor effect in bone. Restaging PET scan on 10/18/2017 demonstrated diffuse sclerotic bone metastases with mild improvement noted at several sites without any new or progressive metastatic disease or evidence of soft tissue metastatic disease. Restaging MRI brain on 10/27/2017 was negative for intracranial disease.  She is here for follow-up prior to Herceptin.  She remains on Tykerb and is tolerating therapy.  Labs adequate for treatment.  She will be set up for ECHO in 01/2018 prior to next cycle.  She is planned for imaging in 02/2018.    2.  Headache.  Pt reports recent fall while while out of town.  She denies hitting her head but hit nose.  She has option of CT of brain if ongoing symptoms.  Recent MRI of brain in 10/2017 was negative.  She reports history of migraine And may need to follow-up with PCP or neurology for management.  She is on morphine and should notify the office if no improvement in symptoms.  No neurological deficits on exam today.    3.  Bone mets.  She is on Zometa every 3 months. Labs done 12/31/2017 reviewed and showed WBC 6.5 HB 10 plts 235,000.  Chemistries WNL with cr 0.96 and normal  LFTs.  Calcium is 9. Recent Pet scan done 10/2017 showed no progressive lesions.    4.  Brain mets.  Recent MRI of brain done 10/27/2017 was negative for intracranial disease.  She will be set up for repeat imaging in 02/2018.  Tykerb has been shown to have role for treatment of brain mets in Her 2 + breast cancer.    5  Hypokalemia.  K+ 3.3.  She is given KCL 20 meq IV today in clinic.  Will repeat labs on RTC.    6  DVT.  Pt is on Xarelto.  If ongoing headache, will check Ct of brain.    Greater than 30 minutes spent with more than 50% spent in counseling and coordination of care.    Interval History:  Historical data obtained from the note dated 10/29/2017.  52 yr old female with Stage IV adenocarcinoma of breast, ER+, HER2 + with metastatic disease to brain and bone. She is S/P multiple lines of therapy. Primary tumor never identified. She is S/P BSO by Dr. Gaetano Net on 08/27/2015. Current therapy is Herceptin + Tykerb beginning on 07/23/2017 and she continues on bone targeted therapy with Zometa every 3 months to reduce SRE and its antitumor effect in bone. Restaging PET scan on 10/18/2017 demonstrated diffuse sclerotic bone metastases with mild improvement noted at several sites without any new or progressive metastatic disease or evidence of soft tissue metastatic disease. Restaging MRI brain on 10/27/2017 was negative for intracranial disease.  Current Status:  Pt is  seen today for follow-up prior to Herceptin.  She reports recent fall while out of town.  She feels she hit her nose.  She also has a bruise on right arm.  She reports Headache.       Breast cancer, stage 4 ([place])   04/25/2014 Initial Diagnosis    Breast cancer, stage 4    04/25/2014 Imaging    CT abdomen/pelvis with widespread metastatic disease to the liver, multiple lytic lesions throughout spine and pelvis. No FX or epidural tumor identified    04/26/2014 Imaging    CT head unremarkable    04/26/2014 Imaging    CT chest with  no lung mass or pulmonary nodules, no adenopathy. Lytic bone lesions, right 2nd rib    04/27/2014 Initial Biopsy    U/S guided liver biopsy, lesion in anterior and inferior left hepatic lobe biopsied    04/27/2014 Pathology Results    Metastatic adenocarcinoma, CK7, ER+, patchy positivity with PR. Possible primary includes breast, less likely gynecologic    05/15/2014 Mammogram    BI-RADS CATEGORY  2: Benign Finding(s)    05/16/2014 PET scan    1. Intensely hypermetabolic hepatic metastasis. 2. Widespread hypermetabolic skeletal lesions. 3. No primary adenocarcinoma identified by FDG PET imaging.    05/19/2014 Imaging    MUGA- Left ventricular ejection fracture greater than 70%.    05/21/2014 Breast MRI    No suspicious masses or enhancement within the breasts. No axillary adenopathy.    05/22/2014 - 07/03/2014 Antibody Plan    Herceptin/Perjeta/Tamoxifen    06/12/2014 - 07/03/2014 Chemotherapy    Taxotere added secondary to persistent abdominal and back pain    06/17/2014 - 06/19/2014 Hospital Admission    Neutropenia, fever, diarrhea, nausea, vomiting    06/20/2014 - 07/10/2014 Radiation Therapy    Dr. Thea Silversmith 12 fractions to L3-S3 (30 Gy) and left scapula (20 Gy).     07/03/2014 Adverse Reaction    Perjeta- induced diarrhea.  Perjeta discontinued    07/16/2014 - 07/20/2014 Hospital Admission    Electrolyte abnormalities, and diarrhea.  Suspect Perjeta-induced diarrhea.  Negative GI work-up.    07/24/2014 - 08/19/2015 Chemotherapy    Herceptin/Tamoxifen/Xgeva    08/21/2014 Imaging    MUGA- Left ventricular ejection fraction equals 71%.    08/24/2014 PET scan    Dramatic reduction in metabolic activity of the widespread liver metastasis. Liver metastasis now have metabolic activity equal to background normal liver activity. Liver has a nodular contour. Marked reduction in metabolic activity of skeletal lesions..    10/05/2014 Progression    Widespread metastatic disease to the brain as  described. Between 20 and 30 intracranial metastatic deposits are now seen. No midline shift or incipient herniation    10/09/2014 - 10/26/2014 Radiation Therapy    Whole Brain XRT    11/14/2014 Imaging    MUGA- LVEF 67%    02/13/2015 Imaging    MUGA- LVEF 59%    02/15/2015 Treatment Plan Change    Due to declining LVEF, will hold Herceptin per PI guidelines.    04/12/2015 -  Chemotherapy    Herceptin restarted    06/02/2015 - 06/08/2015 Hospital Admission    Pneumonia    07/05/2015 Progression     PET/CT concern for mild progression of skeletal metastasis with several lesions within the spine and 1 lesion in the Left iliac wing with mild to moderate metabolic activity new from prior. Rising CA 27-29    07/16/2015 - 10/23/2015 Anti-estrogen oral therapy    Arimidex  07/16/2015 Imaging    MRI brain with satisfactory post treatment apperance of brain. interval resolved enhancing R caudate metastasis, minimal punctate residual enhancing metastatic disease at the inferior L cerebellum. No new metastatic disease or new intracranial abnormality    07/19/2015 Treatment Plan Change    Discontinue Tamoxifen, Zoladex plus Arimidex.     08/27/2015 Procedure    Laparoscopic bilateral salpingo-oophorectomy by Dr. Gaetano Net    10/17/2015 PET scan    Osseous metastatic disease appears slightly progressive based on a new right scapular lesion and increased uptake within lesions in the thoracic spine, left iliac wing and  proximal right femur.    10/17/2015 Progression    Slight progression on PET scan imaging.    10/18/2015 Imaging    REsolved enhancing metastatic disease to the brain status post WBXRT    10/23/2015 - 02/12/2016 Adjuvant Chemotherapy    Faslodex loading followed by maintenance dose.  (Herceptin continued)    11/04/2015 Imaging    MUGA- LEFT ventricular ejection fraction 51% slightly decreased in a 57% on the previous exam.    12/24/2015 Treatment Plan Change    Zometa every 28 days.  Xgeva  discontinued.      01/01/2016 Imaging    MUGA- Left ventricular ejection fraction equals 57.9%. This is increased from 51.1% previously.    02/03/2016 PET scan    1. Mixed metabolic changes in the scattered hypermetabolic sclerotic osseous metastases throughout the axial and proximal appendicular skeleton as detailed above. 2. No new sites of hypermetabolic metastatic disease. Stable pseudo-cirrhotic appearance of the liver due to treated liver metastases with no hypermetabolic liver metastases.     02/03/2016 Progression    PET shows mixed osseous response.  Some lesions more hypermetabolic, others improved.    02/05/2016 Treatment Plan Change    D/C Herceptin.  Faslodex as scheduled on 02/12/2016, then discontinued.  Continue Zometa.    02/05/2016 Treatment Plan Change    Prescriptions for Xeloda 7 days on and 7 days off and Tykerb printed and provided for authorization.    02/19/2016 -  Chemotherapy    Xeloda 2300 mg BID 7 days on and 7 days off and Tykerb     02/24/2016 Treatment Plan Change    Xeloda dose reduced by 10% to 2000 mg BID week on and week off.    03/18/2016 Treatment Plan Change    Xeloda dose reduced to 2000 mg in AM and 1500 mg in PM 7 days on and 7 days off.    03/23/2016 Imaging    MUGA- Normal LEFT ventricular ejection fraction of 56% not significantly changed from 58% on previous exam.    05/19/2016 Imaging    MRI brain- . No new focus of abnormal enhancement to suggest interval metastatic disease. 2. No acute intracranial abnormality. 3. Stable foci of T2 FLAIR hyperintensity in white matter and in the right caudate head compatible with posttreatment changes and treated metastasis.    05/20/2016 PET scan    1. The multiple osseous metastatic lesions shown to be hypermetabolic on the prior exam are reduced in activity or even resolved in hypermetabolic activity compared to prior, as detailed above. There are also numerous sclerotic bony lesions wedge  are not currently and were not previously hypermetabolic, compatible with old non active lesions. 2. No findings of extra osseous metastatic disease currently. 3. Hepatic pseudocirrhosis. 4. Healing rib fractures. Chronic pathologic fracture the left acromion.    07/08/2016 Imaging    MUGA- Low normal LEFT ventricular ejection fraction  of 53%, little changed since the 56% on 03/23/2016 but slightly decreased from the 58% on 01/01/2016.    09/07/2016 Echocardiogram    MUGA- Normal LEFT ventricular ejection fraction of 59% with normal LV wall motion.    09/07/2016 Treatment Plan Change    Xeloda dose reduced to 1500 mg BID 7 days on and 7 days off.    09/15/2016 PET scan    No significant change in diffuse sclerotic bone metastases on CT images, although several show mildly increased FDG uptake since prior study.  No evidence of soft tissue metastatic disease.    11/02/2016 Imaging    MRI brain- No change from the prior study. Previously noted metastatic deposits have been treated. No new lesions identified.    07/23/2017 -  Chemotherapy    Herceptin + Tykerb      Problem List Patient Active Problem List   Diagnosis Date Noted  . Mouth sores [K13.79] 03/18/2016  . Esophageal reflux [K21.9]   . Metastatic breast cancer ([place]) [C50.919]   . HCAP (healthcare-associated pneumonia) [J18.9] 06/02/2015  . Pneumonia [J18.9] 06/02/2015  . Drug-induced cardiomyopathy ([place]) [I42.7] 02/15/2015  . Brain metastases ([place]) [C79.31] 01/29/2015  . Genetic testing [Z13.79] 09/05/2014  . Family history of prostate cancer [Z80.42]   . GERD (gastroesophageal reflux disease) [K21.9] 07/16/2014  . DVT (deep venous thrombosis) ([place]) [I82.409] 07/09/2014  . Bone metastases ([place]) [C79.51] 05/23/2014  . Breast cancer, stage 4 ([place]) [C50.919] 05/16/2014  . Hyperlipidemia [E78.5] 04/23/2014  . Depression [F32.9] 04/23/2014    Past Medical History Past Medical History:  Diagnosis Date  .  Anticoagulated    xarelto  . Anxiety   . Breast cancer metastasized to multiple sites [facility])    liver, brain, and bone  . Breast cancer, stage 4 [facility]) oncologist-  dr Larene [place] ([facility])   dx 12/ 2015 -- breast cancer Stage 4,  ER/HER2 +,  w/  liver, brain and  bone mets/  chemotherapy and radiation therapy  . Chronic pain syndrome    secondary to cancer   . Depression   . Drug-induced cardiomyopathy ([place])    per last MUGA (08-08-2015), ef 56.5/ per last echo 05-18-2014 ef 60-65%  . Family history of prostate cancer   . GERD (gastroesophageal reflux disease)   . History of colon polyps    07-13-2013  benign  . History of DVT (deep vein thrombosis)    07-09-2014  upper right extremity-  RIJ and right subclavian--  resolved  . History of gastritis    erosive  . History of pneumonia    HCAP 06-07-2015--  resolved per cxr 07-04-2015  . History of radiation therapy    12 fractions to L3 - S3, 30Gy and left spacula 20Gy (06-20-2014 to 07-10-2014) //  whole brain rxt (10-09-2014 to 10-26-2014)  . History of small bowel obstruction    S/P RESECTION 2008  . Migraine   . PONV (postoperative nausea and vomiting)    pt states scope patch does well    Past Surgical History Past Surgical History:  Procedure Laterality Date  . BREAST REDUCTION SURGERY  03/17/2011   Procedure: MAMMARY REDUCTION BILATERAL (BREAST);  Surgeon: Mary A Contogiannis;  Location: [place];  Service: Plastics;  Laterality: Bilateral;  . CATARACT EXTRACTION W/ INTRAOCULAR LENS  IMPLANT, BILATERAL  2008  . CERVICAL FUSION  2003   C5 -- C6  . COLONOSCOPY N/A 07/13/2013   Procedure: COLONOSCOPY;  Surgeon: Rogene Houston, MD;  [facility];  Service: Endoscopy;  Laterality: N/A;  930  . COLONOSCOPY N/A 11/26/2014   Procedure: COLONOSCOPY;  Surgeon: Rogene Houston, MD;  [facility];  Service: Endoscopy;  Laterality: N/A;  730  . D & C HYSTEROSCOPY/  RESECTION  ENDOMETRIAL MASS/  [place] ENDOMETRIAL ABLATION  04-11-2010  . DX LAPAROSCOPY W/ PARTIAL SMALL BOWEL RESECTION AND APPENDECTOMY  04-13-2007  . ESOPHAGOGASTRODUODENOSCOPY N/A 05/25/2014   Procedure: ESOPHAGOGASTRODUODENOSCOPY (EGD);  Surgeon: Rogene Houston, MD;  [facility];  Service: Endoscopy;  Laterality: N/A;  155  . KNEE ARTHROSCOPY Right 2005  . LAPAROSCOPIC ASSISTED VAGINAL HYSTERECTOMY  10-13-2010   w/ Bx Left Fallopian tube and Aspiration Right Ovarian Cyst  . LAPAROSCOPIC CHOLECYSTECTOMY  11-17-2002  . LAPAROSCOPIC SALPINGO OOPHERECTOMY Bilateral 08/26/2015   Procedure: LAPAROSCOPIC SALPINGO OOPHORECTOMY, bilateral;  Surgeon: Everlene Farrier, MD;  Location: [place];  Service: Gynecology;  Laterality: Bilateral;  . PORTACATH PLACEMENT  05-17-2014  . REDUCTION MAMMAPLASTY    . SHOULDER ARTHROSCOPY WITH ROTATOR CUFF REPAIR Right 2002  . TRANSTHORACIC ECHOCARDIOGRAM  05-18-2014   ef 60-65%//   last MUGA  (08-08-2015)  ef 56.6%      Family History Family History  Problem Relation Age of Onset  . Diabetes Father   . Heart attack Maternal Grandmother 30       multiple over lifetime.  . Cancer Maternal Grandmother 63       NOS  . Prostate cancer Maternal Grandfather        dx in his 49s  . Lung cancer Paternal Grandfather        dx <50  . Lymphoma Maternal Aunt        dx in her 76s  . Melanoma Cousin 87       maternal first cousin  . Brain cancer Cousin        paternal first cousin dx under 22  . Prostate cancer Other        MGF's father  . Colon cancer Other        MGM's mother     Social History  reports that she quit smoking about 23 years ago. Her smoking use included cigarettes. She has never used smokeless tobacco. She reports that she drinks alcohol. She reports that she does not use drugs.  Medications  Current Outpatient Medications:  .  acetaminophen (TYLENOL) 325 MG tablet, Take 650 mg by mouth every 6 (six) hours as needed for  moderate pain or headache., Disp: , Rfl:  .  calcium carbonate (TUMS - DOSED IN MG ELEMENTAL CALCIUM) 500 MG chewable tablet, Chew 2 tablets by mouth daily. , Disp: , Rfl:  .  Calcium-Phosphorus-Vitamin D (CALCIUM/D3 ADULT GUMMIES PO), Take 2 tablets by mouth 2 (two) times daily. dosage of calcium 1246m and vitamin d 10015m Disp: , Rfl:  .  desvenlafaxine (PRISTIQ) 100 MG 24 hr tablet, Take 1 tablet (100 mg total) by mouth daily., Disp: 90 tablet, Rfl: 1 .  dicyclomine (BENTYL) 20 MG tablet, Take 1 tablet (20 mg total) by mouth 4 (four) times daily -  before meals and at bedtime., Disp: 40 tablet, Rfl: 0 .  diphenhydrAMINE (BENADRYL) 25 mg capsule, Take 25 mg by mouth at bedtime as needed for itching. , Disp: , Rfl:  .  diphenoxylate-atropine (LOMOTIL) 2.5-0.025 MG tablet, Take 2 tablets by mouth 4 (four) times daily as needed for diarrhea or loose stools., Disp: 60 tablet, Rfl: 1 .  DUREZOL 0.05 %  EMUL, INSTILL ONE DROP IN THE RIGHT EYE TWICE DAILY TIME ONE WEEK THEN ONCE DAILY, Disp: , Rfl: 2 .  furosemide (LASIX) 20 MG tablet, Take 1 tablet (20 mg total) by mouth daily., Disp: 30 tablet, Rfl: 2 .  gabapentin (NEURONTIN) 300 MG capsule, TAKE TWO CAPSULES BY MOUTH AT BEDTIME, Disp: 60 capsule, Rfl: 5 .  lapatinib (TYKERB) 250 MG tablet, Take 4 tablets (1,000 mg total) by mouth daily., Disp: 120 tablet, Rfl: 0 .  lidocaine-prilocaine (EMLA) cream, Apply 1 application topically daily as needed (apply to port before chemo)., Disp: 30 g, Rfl: 2 .  LORazepam (ATIVAN) 0.5 MG tablet, Take 1 tablet (0.5 mg total) by mouth every 6 (six) hours as needed. for anxiety, Disp: 30 tablet, Rfl: 2 .  Melatonin 10 MG CAPS, Take 1 tablet by mouth at bedtime. Pt states she takes one tablet at night for sleep , Disp: , Rfl:  .  morphine (MS CONTIN) 30 MG 12 hr tablet, TAKE 1 TABLET BY MOUTH EVERY TWELVE HOURS, Disp: 60 tablet, Rfl: 0 .  NARCAN 4 MG/0.1ML LIQD nasal spray kit, CALL 911. ADMINISTER A SINGLE SPRAY OF  NARCAN IN ONE NOSTRIL, REPEAT EVERY 3 MINUTES AS NEEDED IF NO OR MINIMAL RESPONSE, Disp: , Rfl: 99 .  ondansetron (ZOFRAN) 8 MG tablet, TAKE ONE TABLET BY MOUTH EVERY 8 HOURS AS NEEDED FOR NAUSEA AND VOMITING, Disp: , Rfl: 4 .  oxyCODONE (OXY IR/ROXICODONE) 5 MG immediate release tablet, Take 1-2 tablets (5-10 mg total) by mouth every 4 (four) hours as needed for moderate pain., Disp: 60 tablet, Rfl: 0 .  pantoprazole (PROTONIX) 40 MG tablet, TAKE ONE TABLET BY MOUTH EVERY TWELVE HOURS, Disp: 60 tablet, Rfl: 3 .  potassium chloride SA (K-DUR,KLOR-CON) 20 MEQ tablet, Take 1 tablet (20 mEq total) by mouth daily., Disp: 30 tablet, Rfl: 3 .  promethazine (PHENERGAN) 25 MG tablet, TAKE 1 TABLET BY MOUTH EVERY SIX HOURS AS NEEDED FOR NAUSEA OR VOMITING, Disp: 30 tablet, Rfl: 6 .  rivaroxaban (XARELTO) 20 MG TABS tablet, TAKE 1 TABLET BY MOUTH EVERY DAY WITH SUPPER, Disp: 30 tablet, Rfl: 3 .  spironolactone (ALDACTONE) 25 MG tablet, Take 1 tablet (25 mg total) by mouth daily., Disp: 30 tablet, Rfl: 3 .  traZODone (DESYREL) 50 MG tablet, Take 1-2 tablets at bedtime for sleep, Disp: 180 tablet, Rfl: 0 .  Zoledronic Acid (ZOMETA IV), Inject into the vein every 3 (three) months., Disp: , Rfl:  .  zolpidem (AMBIEN) 10 MG tablet, Take 1 tablet (10 mg total) by mouth at bedtime as needed for sleep., Disp: 30 tablet, Rfl: 1  Allergies Patient has no known allergies.  Review of Systems Review of Systems - Oncology ROS as per HPI otherwise 12 point ROS is negative.   Physical Exam  Vitals Wt Readings from Last 3 Encounters:  12/31/17 263 lb 6.4 oz (119.5 kg)  12/10/17 262 lb (118.8 kg)  11/19/17 267 lb (121.1 kg)   Temp Readings from Last 3 Encounters:  12/31/17 98.2 F (36.8 C) (Oral)  12/31/17 98 F (36.7 C) (Oral)  12/10/17 98.2 F (36.8 C) (Oral)   BP Readings from Last 3 Encounters:  12/31/17 (!) 118/54  12/31/17 135/61  12/10/17 (!) 123/56   Pulse Readings from Last 3 Encounters:   12/31/17 61  12/31/17 72  12/10/17 68    Constitutional: Well-developed, well-nourished, and in no distress.   HENT: Head: Normocephalic and atraumatic.  Mouth/Throat: No oropharyngeal exudate. Mucosa moist. Eyes: Pupils  are equal, round, and reactive to light. Conjunctivae are normal. No scleral icterus.  Neck: Normal range of motion. Neck supple. No JVD present.  Cardiovascular: Normal rate, regular rhythm and normal heart sounds.  Exam reveals no gallop and no friction rub.   No murmur heard. Pulmonary/Chest: Effort normal and breath sounds normal. No respiratory distress. No wheezes.No rales.  Abdominal: Soft. Bowel sounds are normal. No distension. There is no tenderness. There is no guarding.  Musculoskeletal: No edema.  Decreased ROM of left shoulder.   Lymphadenopathy: No cervical, axillary or supraclavicular adenopathy.  Neurological: Alert and oriented to person, place, and time. No cranial nerve deficit.  Skin: Skin is warm and dry. No rash noted. No erythema. No pallor.  Psychiatric: Affect and judgment normal.  Breast exam:  Chaperone present.  Evidence of prior breast surgeries.    Labs Infusion on 12/31/2017  Component Date Value Ref Range Status  . Sodium 12/31/2017 140  135 - 145 mmol/L Final  . Potassium 12/31/2017 3.3* 3.5 - 5.1 mmol/L Final  . Chloride 12/31/2017 102  98 - 111 mmol/L Final  . CO2 12/31/2017 30  22 - 32 mmol/L Final  . Glucose, Bld 12/31/2017 120* 70 - 99 mg/dL Final  . BUN 12/31/2017 6  6 - 20 mg/dL Final  . Creatinine, Ser 12/31/2017 0.96  0.44 - 1.00 mg/dL Final  . Calcium 12/31/2017 9.0  8.9 - 10.3 mg/dL Final  . Total Protein 12/31/2017 6.8  6.5 - 8.1 g/dL Final  . Albumin 12/31/2017 3.6  3.5 - 5.0 g/dL Final  . AST 12/31/2017 27  15 - 41 U/L Final  . ALT 12/31/2017 22  0 - 44 U/L Final  . Alkaline Phosphatase 12/31/2017 97  38 - 126 U/L Final  . Total Bilirubin 12/31/2017 0.6  0.3 - 1.2 mg/dL Final  . GFR calc non Af Amer 12/31/2017  >60  >60 mL/min Final  . GFR calc Af Amer 12/31/2017 >60  >60 mL/min Final   Comment: (NOTE) The eGFR has been calculated using the CKD EPI equation. This calculation has not been validated in all clinical situations. eGFR's persistently <60 mL/min signify possible Chronic Kidney Disease.   Georgiann Hahn gap 12/31/2017 8  5 - 15 Final   Performed at [facility], [address] [place], [place] [address]  . WBC 12/31/2017 6.5  4.0 - 10.5 K/uL Final  . RBC 12/31/2017 4.10  3.87 - 5.11 MIL/uL Final  . Hemoglobin 12/31/2017 10.1* 12.0 - 15.0 g/dL Final  . HCT 12/31/2017 33.6* 36.0 - 46.0 % Final  . MCV 12/31/2017 82.0  78.0 - 100.0 fL Final  . MCH 12/31/2017 24.6* 26.0 - 34.0 pg Final  . MCHC 12/31/2017 30.1  30.0 - 36.0 g/dL Final  . RDW 12/31/2017 17.4* 11.5 - 15.5 % Final  . Platelets 12/31/2017 235  150 - 400 K/uL Final  . Neutrophils Relative % 12/31/2017 82  % Final  . Neutro Abs 12/31/2017 5.3  1.7 - 7.7 K/uL Final  . Lymphocytes Relative 12/31/2017 12  % Final  . Lymphs Abs 12/31/2017 0.8  0.7 - 4.0 K/uL Final  . Monocytes Relative 12/31/2017 6  % Final  . Monocytes Absolute 12/31/2017 0.4  0.1 - 1.0 K/uL Final  . Eosinophils Relative 12/31/2017 0  % Final  . Eosinophils Absolute 12/31/2017 0.0  0.0 - 0.7 K/uL Final  . Basophils Relative 12/31/2017 0  % Final  . Basophils Absolute 12/31/2017 0.0  0.0 - 0.1 K/uL Final  Performed at [facility], [address], [place], [place] [address]     Pathology Orders Placed This Encounter  Procedures  . CBC with Differential/Platelet    Standing Status:   Future    Standing Expiration Date:   01/01/2019  . Comprehensive metabolic panel    Standing Status:   Future    Standing Expiration Date:   01/01/2019  . Lactate dehydrogenase    Standing Status:   Future    Standing Expiration Date:   01/01/2019  . ECHOCARDIOGRAM COMPLETE    Breast cancer.  herceptin monitoring    Standing Status:   Future    Standing Expiration Date:    04/03/2019    Order Specific Question:   Where should this test be performed    Answer:   [facility]    Order Specific Question:   Perflutren DEFINITY (image enhancing agent) should be administered unless hypersensitivity or allergy exist    Answer:   Administer Perflutren    Order Specific Question:   Expected Date:    Answer:   1 week       Zoila Shutter MD

## 2018-01-04 ENCOUNTER — Other Ambulatory Visit (HOSPITAL_COMMUNITY): Payer: Self-pay

## 2018-01-04 DIAGNOSIS — C229 Malignant neoplasm of liver, not specified as primary or secondary: Secondary | ICD-10-CM

## 2018-01-04 DIAGNOSIS — C7951 Secondary malignant neoplasm of bone: Secondary | ICD-10-CM

## 2018-01-04 DIAGNOSIS — C50919 Malignant neoplasm of unspecified site of unspecified female breast: Secondary | ICD-10-CM

## 2018-01-04 DIAGNOSIS — Z0000 Encounter for general adult medical examination without abnormal findings: Secondary | ICD-10-CM

## 2018-01-04 DIAGNOSIS — R5382 Chronic fatigue, unspecified: Secondary | ICD-10-CM

## 2018-01-04 MED ORDER — MORPHINE SULFATE ER 30 MG PO TBCR: EXTENDED_RELEASE_TABLET | ORAL | 0 refills | Status: DC

## 2018-01-04 NOTE — Telephone Encounter (Signed)
Patients husband called for refill on pain medication. Reviewed with provider who okayed refill. Prescription printed for her to sign.

## 2018-01-17 ENCOUNTER — Ambulatory Visit (HOSPITAL_COMMUNITY)
Admission: RE | Admit: 2018-01-17 | Discharge: 2018-01-17 | Disposition: A | Discharge status: Home / Self Care | Payer: Medicare Other | Source: Ambulatory Visit | Attending: Internal Medicine | Admitting: Internal Medicine

## 2018-01-17 DIAGNOSIS — I517 Cardiomegaly: Secondary | ICD-10-CM | POA: Insufficient documentation

## 2018-01-17 DIAGNOSIS — K219 Gastro-esophageal reflux disease without esophagitis: Secondary | ICD-10-CM | POA: Diagnosis not present

## 2018-01-17 DIAGNOSIS — C7951 Secondary malignant neoplasm of bone: Secondary | ICD-10-CM | POA: Insufficient documentation

## 2018-01-17 DIAGNOSIS — C50919 Malignant neoplasm of unspecified site of unspecified female breast: Secondary | ICD-10-CM | POA: Diagnosis not present

## 2018-01-17 DIAGNOSIS — M25512 Pain in left shoulder: Secondary | ICD-10-CM | POA: Diagnosis not present

## 2018-01-17 DIAGNOSIS — E785 Hyperlipidemia, unspecified: Secondary | ICD-10-CM | POA: Insufficient documentation

## 2018-01-17 NOTE — Progress Notes (Signed)
*  PRELIMINARY RESULTS* Echocardiogram 2D Echocardiogram has been performed.  Samuel Germany 01/17/2018, 11:08 AM

## 2018-01-18 ENCOUNTER — Other Ambulatory Visit: Payer: Self-pay | Admitting: Orthopedic Surgery

## 2018-01-18 DIAGNOSIS — M25512 Pain in left shoulder: Secondary | ICD-10-CM

## 2018-01-19 ENCOUNTER — Other Ambulatory Visit (HOSPITAL_COMMUNITY): Payer: Self-pay | Admitting: Hematology

## 2018-01-20 ENCOUNTER — Other Ambulatory Visit (HOSPITAL_COMMUNITY): Payer: Self-pay | Admitting: *Deleted

## 2018-01-20 MED ORDER — LAPATINIB DITOSYLATE 250 MG PO TABS: 1000.0000 mg | ORAL_TABLET | Freq: Every day | ORAL | 0 refills | Status: DC

## 2018-01-20 NOTE — Telephone Encounter (Signed)
Patient had her echo performed yesterday with no changes since last exam. Per last office note, patient is to continue therapy. Tykerb refilled.

## 2018-01-21 ENCOUNTER — Inpatient Hospital Stay (HOSPITAL_COMMUNITY): Payer: Medicare Other | Attending: Adult Health

## 2018-01-21 ENCOUNTER — Encounter (HOSPITAL_COMMUNITY): Payer: Self-pay | Admitting: Internal Medicine

## 2018-01-21 ENCOUNTER — Ambulatory Visit (HOSPITAL_COMMUNITY): Payer: Self-pay | Admitting: Hematology

## 2018-01-21 ENCOUNTER — Inpatient Hospital Stay (HOSPITAL_BASED_OUTPATIENT_CLINIC_OR_DEPARTMENT_OTHER): Payer: Medicare Other | Admitting: Internal Medicine

## 2018-01-21 VITALS — BP 114/42 | HR 85 | Temp 97.6°F | Resp 16 | Wt 262.8 lb

## 2018-01-21 DIAGNOSIS — Z86718 Personal history of other venous thrombosis and embolism: Secondary | ICD-10-CM | POA: Diagnosis not present

## 2018-01-21 DIAGNOSIS — Z5112 Encounter for antineoplastic immunotherapy: Secondary | ICD-10-CM

## 2018-01-21 DIAGNOSIS — E785 Hyperlipidemia, unspecified: Secondary | ICD-10-CM

## 2018-01-21 DIAGNOSIS — C7931 Secondary malignant neoplasm of brain: Secondary | ICD-10-CM | POA: Insufficient documentation

## 2018-01-21 DIAGNOSIS — Z7901 Long term (current) use of anticoagulants: Secondary | ICD-10-CM

## 2018-01-21 DIAGNOSIS — G4700 Insomnia, unspecified: Secondary | ICD-10-CM | POA: Insufficient documentation

## 2018-01-21 DIAGNOSIS — F418 Other specified anxiety disorders: Secondary | ICD-10-CM | POA: Insufficient documentation

## 2018-01-21 DIAGNOSIS — C50919 Malignant neoplasm of unspecified site of unspecified female breast: Secondary | ICD-10-CM

## 2018-01-21 DIAGNOSIS — E876 Hypokalemia: Secondary | ICD-10-CM | POA: Insufficient documentation

## 2018-01-21 DIAGNOSIS — C787 Secondary malignant neoplasm of liver and intrahepatic bile duct: Secondary | ICD-10-CM | POA: Diagnosis not present

## 2018-01-21 DIAGNOSIS — Z9221 Personal history of antineoplastic chemotherapy: Secondary | ICD-10-CM | POA: Diagnosis not present

## 2018-01-21 DIAGNOSIS — C7951 Secondary malignant neoplasm of bone: Secondary | ICD-10-CM | POA: Diagnosis not present

## 2018-01-21 DIAGNOSIS — C50812 Malignant neoplasm of overlapping sites of left female breast: Secondary | ICD-10-CM | POA: Insufficient documentation

## 2018-01-21 DIAGNOSIS — Z923 Personal history of irradiation: Secondary | ICD-10-CM | POA: Insufficient documentation

## 2018-01-21 DIAGNOSIS — Z170 Estrogen receptor positive status [ER+]: Secondary | ICD-10-CM | POA: Insufficient documentation

## 2018-01-21 LAB — COMPREHENSIVE METABOLIC PANEL
ALK PHOS: 96 U/L (ref 38–126)
ALT: 18 U/L (ref 0–44)
AST: 22 U/L (ref 15–41)
Albumin: 3.5 g/dL (ref 3.5–5.0)
Anion gap: 8 (ref 5–15)
BUN: 8 mg/dL (ref 6–20)
CALCIUM: 8.8 mg/dL — AB (ref 8.9–10.3)
CHLORIDE: 99 mmol/L (ref 98–111)
CO2: 32 mmol/L (ref 22–32)
CREATININE: 1 mg/dL (ref 0.44–1.00)
GFR calc Af Amer: >60 mL/min (ref >60)
GFR calc non Af Amer: >60 mL/min (ref >60)
Glucose, Bld: 160 mg/dL — ABNORMAL HIGH (ref 70–99)
Potassium: 3.1 mmol/L — ABNORMAL LOW (ref 3.5–5.1)
SODIUM: 139 mmol/L (ref 135–145)
Total Bilirubin: 0.5 mg/dL (ref 0.3–1.2)
Total Protein: 6.8 g/dL (ref 6.5–8.1)

## 2018-01-21 LAB — MAGNESIUM: Magnesium: 2 mg/dL (ref 1.7–2.4)

## 2018-01-21 MED ORDER — SODIUM CHLORIDE 0.9% FLUSH
10.0000 mL | INTRAVENOUS | Status: DC | PRN
  Administered 2018-01-21: 10 mL
  Filled 2018-01-21: qty 10

## 2018-01-21 MED ORDER — ACETAMINOPHEN 325 MG PO TABS
ORAL_TABLET | ORAL | Status: AC
  Filled 2018-01-21: qty 2

## 2018-01-21 MED ORDER — DIPHENHYDRAMINE HCL 25 MG PO CAPS
50.0000 mg | ORAL_CAPSULE | Freq: Once | ORAL | Status: AC
  Administered 2018-01-21: 50 mg via ORAL

## 2018-01-21 MED ORDER — DIPHENHYDRAMINE HCL 25 MG PO CAPS
ORAL_CAPSULE | ORAL | Status: AC
  Filled 2018-01-21: qty 2

## 2018-01-21 MED ORDER — HEPARIN SOD (PORK) LOCK FLUSH 100 UNIT/ML IV SOLN
500.0000 [IU] | Freq: Once | INTRAVENOUS | Status: AC | PRN
  Administered 2018-01-21: 500 [IU]
  Filled 2018-01-21: qty 5

## 2018-01-21 MED ORDER — TRASTUZUMAB CHEMO 150 MG IV SOLR
750.0000 mg | Freq: Once | INTRAVENOUS | Status: AC
  Administered 2018-01-21: 750 mg via INTRAVENOUS
  Filled 2018-01-21: qty 35.72

## 2018-01-21 MED ORDER — POTASSIUM CHLORIDE 10 MEQ/100ML IV SOLN
10.0000 meq | INTRAVENOUS | Status: AC
  Administered 2018-01-21 (×2): 10 meq via INTRAVENOUS
  Filled 2018-01-21 (×2): qty 100

## 2018-01-21 MED ORDER — ZOLPIDEM TARTRATE 10 MG PO TABS: 10.0000 mg | ORAL_TABLET | Freq: Every evening | ORAL | 1 refills | Status: DC | PRN

## 2018-01-21 MED ORDER — SODIUM CHLORIDE 0.9 % IV SOLN
Freq: Once | INTRAVENOUS | Status: AC
  Administered 2018-01-21: 11:00:00 via INTRAVENOUS

## 2018-01-21 MED ORDER — HEPARIN SOD (PORK) LOCK FLUSH 100 UNIT/ML IV SOLN
INTRAVENOUS | Status: AC
  Filled 2018-01-21: qty 5

## 2018-01-21 MED ORDER — ACETAMINOPHEN 325 MG PO TABS
650.0000 mg | ORAL_TABLET | Freq: Once | ORAL | Status: AC
  Administered 2018-01-21: 650 mg via ORAL

## 2018-01-21 NOTE — Patient Instructions (Signed)
[facility] Discharge Instructions for Patients Receiving Chemotherapy   Beginning January 23rd 2017 lab work for the Cancer Center will be done in the  Main lab at [facility] on 1st floor. If you have a lab appointment with the Cancer Center please come in thru the  Main Entrance and check in at the main information desk   Today you received the following chemotherapy agents   To help prevent nausea and vomiting after your treatment, we encourage you to take your nausea medication     If you develop nausea and vomiting, or diarrhea that is not controlled by your medication, call the clinic.  The clinic phone number is [phone]. Office hours are Monday-Friday 8:30am-5:00pm.  BELOW ARE SYMPTOMS THAT SHOULD BE REPORTED IMMEDIATELY:  *FEVER GREATER THAN 101.0 F  *CHILLS WITH OR WITHOUT FEVER  NAUSEA AND VOMITING THAT IS NOT CONTROLLED WITH YOUR NAUSEA MEDICATION  *UNUSUAL SHORTNESS OF BREATH  *UNUSUAL BRUISING OR BLEEDING  TENDERNESS IN MOUTH AND THROAT WITH OR WITHOUT PRESENCE OF ULCERS  *URINARY PROBLEMS  *BOWEL PROBLEMS  UNUSUAL RASH Items with * indicate a potential emergency and should be followed up as soon as possible. If you have an emergency after office hours please contact your primary care physician or go to the nearest emergency department.  Please call the clinic during office hours if you have any questions or concerns.   You may also contact the Patient Navigator at [phone] should you have any questions or need assistance in obtaining follow up care.      Resources For Cancer Patients and their Caregivers ? [employer]: Can assist with transportation, wigs, general needs, runs Look Good Feel Better.        1-888-227-6333 ? Cancer Care: Provides financial assistance, online support groups, medication/co-pay assistance.  1-800-813-HOPE (4673) ? [facility] Assists [place] cancer  patients and their families through emotional , educational and financial support.  336-427-4357 ? [place] DSS Where to apply for food stamps, Medicaid and utility assistance. 336-342-1394 ? [facility]: Transportation to medical appointments. 336-347-2287 ? Social Security Administration: May apply for disability if have a Stage IV cancer. 336-342-7796 1-800-772-1213 ? [place] [facility]: Assists with nutrition, care and transit needs. 336-349-2343         

## 2018-01-21 NOTE — Progress Notes (Signed)
Treatment given per orders. Patient tolerated it well without problems. Vitals stable and discharged home from clinic ambulatory. Follow up as scheduled.  

## 2018-01-21 NOTE — Progress Notes (Signed)
Labs 12/31/17 and ECHO 01/17/2018.  Labs reviewed with Dr. Walden Field for potassium 3.1 today.  Patient to receive Potassium 95meq IV today verbal order Dr. Walden Field.  Pharmacy notified.

## 2018-01-21 NOTE — Progress Notes (Signed)
Patient continues to Tykerb as directed. She states she has missed one dose about 2 weeks ago but denies side effects. She states "I actually feel pretty good."

## 2018-01-21 NOTE — Progress Notes (Signed)
Diagnosis Metastatic breast cancer (HCC) - Plan: Magnesium, Basic metabolic panel, NM PET Image Restag (PS) Skull Base To Thigh, MR Brain W Wo Contrast, CBC with Differential/Platelet, Comprehensive metabolic panel, Lactate dehydrogenase, DISCONTINUED: 0.9 %  sodium chloride infusion, DISCONTINUED: sodium chloride flush (NS) 0.9 % injection 10 mL, DISCONTINUED: heparin lock flush 100 unit/mL, DISCONTINUED: acetaminophen (TYLENOL) tablet 650 mg, DISCONTINUED: diphenhydrAMINE (BENADRYL) capsule 50 mg, DISCONTINUED: trastuzumab (HERCEPTIN) 714 mg in sodium chloride 0.9 % 250 mL chemo infusion  Insomnia, unspecified type - Plan: zolpidem (AMBIEN) 10 MG tablet  Brain metastases (HCC) - Plan: NM PET Image Restag (PS) Skull Base To Thigh, MR Brain W Wo Contrast, CBC with Differential/Platelet, Comprehensive metabolic panel, Lactate dehydrogenase  Staging Cancer Staging Breast cancer, stage 4 ([place]) Staging form: Breast, AJCC 7th Edition - Clinical stage from 05/27/2014: Stage IV (T0, N0, M1) - Signed by Baird Cancer, PA-C on 05/27/2014   Assessment and Plan:  1.  52 yr old female with Stage IV adenocarcinoma of breast, ER+, HER2 + with metastatic disease to brain and bone. She is S/P multiple lines of therapy. Primary tumor never identified. She is S/P BSO by Dr. Gaetano Net on 08/27/2015. Current therapy is Herceptin + Tykerb beginning on 07/23/2017 and she continues on bone targeted therapy with Zometa every 3 months to reduce SRE and its antitumor effect in bone. Restaging PET scan on 10/18/2017 demonstrated diffuse sclerotic bone metastases with mild improvement noted at several sites without any new or progressive metastatic disease or evidence of soft tissue metastatic disease. Restaging MRI brain on 10/27/2017 was negative for intracranial disease.  She is here for follow-up prior to Herceptin.  She remains on Tykerb and is tolerating therapy.  ECHO done 01/17/2018 shows EF 60-65%.   Pt set up for PET scan  in 02/2018 and will follow-up at that time to go over results.  Continue Tykerb and Herceptin as directed.    2.  Headache.  Pt reports this is improved.   MRI of brain done 10/2017 was negative.  She reports history of migraine  Follow-up with PCP or neurology for management.  She is on morphine.  Pt is set up for MRI of brain for ongoing follow-up in 02/2018.    3.  Hypokalemia.  Pt as K+ of 3.1 on labs done 01/21/2018.  Mg is WNL at 2.  She will receive 20 meq IV KCL today in clinic and should take po potassium daily.  Pt on lasix which is likely contributing to loss of potassium.   4.  Bone mets.  She is on Zometa every 3 months.   Labs done 01/21/2018 show Cr 1 and Calcium of 8.8.  Pet scan done 10/2017 showed no progressive lesions.  Continue Zometa as directed.    5.  Brain mets.  MRI of brain done 10/27/2017 was negative for intracranial disease.  She is set up for MRI brain in 02/2018.  Tykerb has been shown to have role for treatment of brain mets in Her 2 + breast cancer.    6  DVT.  Pt is on Xarelto.  If ongoing headache, will check Ct of brain.    7.  Insomnia.  Pt is requesting script for Ambien.  RX for Ambien sent to pharmacy.    30 minutes spent with more than 50% spent in counseling and coordination of care.    Interval History:  Historical data obtained from the note dated 10/29/2017.  52 yr old female with Stage IV adenocarcinoma of  breast, ER+, HER2 + with metastatic disease to brain and bone. She is S/P multiple lines of therapy. Primary tumor never identified. She is S/P BSO by Dr. Gaetano Net on 08/27/2015. Current therapy is Herceptin + Tykerb beginning on 07/23/2017 and she continues on bone targeted therapy with Zometa every 3 months to reduce SRE and its antitumor effect in bone. Restaging PET scan on 10/18/2017 demonstrated diffuse sclerotic bone metastases with mild improvement noted at several sites without any new or progressive metastatic disease or evidence of soft tissue  metastatic disease. Restaging MRI brain on 10/27/2017 was negative for intracranial disease.  Current Status:  Pt is seen today for follow-up prior to Herceptin.  She reports headache is improved.  She had recent echo. She is requesting RX for Ambien.       Breast cancer, stage 4 ([place])   04/25/2014 Initial Diagnosis    Breast cancer, stage 4    04/25/2014 Imaging    CT abdomen/pelvis with widespread metastatic disease to the liver, multiple lytic lesions throughout spine and pelvis. No FX or epidural tumor identified    04/26/2014 Imaging    CT head unremarkable    04/26/2014 Imaging    CT chest with no lung mass or pulmonary nodules, no adenopathy. Lytic bone lesions, right 2nd rib    04/27/2014 Initial Biopsy    U/S guided liver biopsy, lesion in anterior and inferior left hepatic lobe biopsied    04/27/2014 Pathology Results    Metastatic adenocarcinoma, CK7, ER+, patchy positivity with PR. Possible primary includes breast, less likely gynecologic    05/15/2014 Mammogram    BI-RADS CATEGORY  2: Benign Finding(s)    05/16/2014 PET scan    1. Intensely hypermetabolic hepatic metastasis. 2. Widespread hypermetabolic skeletal lesions. 3. No primary adenocarcinoma identified by FDG PET imaging.    05/19/2014 Imaging    MUGA- Left ventricular ejection fracture greater than 70%.    05/21/2014 Breast MRI    No suspicious masses or enhancement within the breasts. No axillary adenopathy.    05/22/2014 - 07/03/2014 Antibody Plan    Herceptin/Perjeta/Tamoxifen    06/12/2014 - 07/03/2014 Chemotherapy    Taxotere added secondary to persistent abdominal and back pain    06/17/2014 - 06/19/2014 Hospital Admission    Neutropenia, fever, diarrhea, nausea, vomiting    06/20/2014 - 07/10/2014 Radiation Therapy    Dr. Thea Silversmith 12 fractions to L3-S3 (30 Gy) and left scapula (20 Gy).     07/03/2014 Adverse Reaction    Perjeta- induced diarrhea.  Perjeta discontinued    07/16/2014 - 07/20/2014 Hospital  Admission    Electrolyte abnormalities, and diarrhea.  Suspect Perjeta-induced diarrhea.  Negative GI work-up.    07/24/2014 - 08/19/2015 Chemotherapy    Herceptin/Tamoxifen/Xgeva    08/21/2014 Imaging    MUGA- Left ventricular ejection fraction equals 71%.    08/24/2014 PET scan    Dramatic reduction in metabolic activity of the widespread liver metastasis. Liver metastasis now have metabolic activity equal to background normal liver activity. Liver has a nodular contour. Marked reduction in metabolic activity of skeletal lesions..    10/05/2014 Progression    Widespread metastatic disease to the brain as described. Between 20 and 30 intracranial metastatic deposits are now seen. No midline shift or incipient herniation    10/09/2014 - 10/26/2014 Radiation Therapy    Whole Brain XRT    11/14/2014 Imaging    MUGA- LVEF 67%    02/13/2015 Imaging    MUGA- LVEF 59%    02/15/2015  Treatment Plan Change    Due to declining LVEF, will hold Herceptin per PI guidelines.    04/12/2015 -  Chemotherapy    Herceptin restarted    06/02/2015 - 06/08/2015 Hospital Admission    Pneumonia    07/05/2015 Progression     PET/CT concern for mild progression of skeletal metastasis with several lesions within the spine and 1 lesion in the Left iliac wing with mild to moderate metabolic activity new from prior. Rising CA 27-29    07/16/2015 - 10/23/2015 Anti-estrogen oral therapy    Arimidex    07/16/2015 Imaging    MRI brain with satisfactory post treatment apperance of brain. interval resolved enhancing R caudate metastasis, minimal punctate residual enhancing metastatic disease at the inferior L cerebellum. No new metastatic disease or new intracranial abnormality    07/19/2015 Treatment Plan Change    Discontinue Tamoxifen, Zoladex plus Arimidex.     08/27/2015 Procedure    Laparoscopic bilateral salpingo-oophorectomy by Dr. Gaetano Net    10/17/2015 PET scan    Osseous metastatic disease appears slightly  progressive based on a new right scapular lesion and increased uptake within lesions in the thoracic spine, left iliac wing and  proximal right femur.    10/17/2015 Progression    Slight progression on PET scan imaging.    10/18/2015 Imaging    REsolved enhancing metastatic disease to the brain status post WBXRT    10/23/2015 - 02/12/2016 Adjuvant Chemotherapy    Faslodex loading followed by maintenance dose.  (Herceptin continued)    11/04/2015 Imaging    MUGA- LEFT ventricular ejection fraction 51% slightly decreased in a 57% on the previous exam.    12/24/2015 Treatment Plan Change    Zometa every 28 days.  Xgeva discontinued.      01/01/2016 Imaging    MUGA- Left ventricular ejection fraction equals 57.9%. This is increased from 51.1% previously.    02/03/2016 PET scan    1. Mixed metabolic changes in the scattered hypermetabolic sclerotic osseous metastases throughout the axial and proximal appendicular skeleton as detailed above. 2. No new sites of hypermetabolic metastatic disease. Stable pseudo-cirrhotic appearance of the liver due to treated liver metastases with no hypermetabolic liver metastases.     02/03/2016 Progression    PET shows mixed osseous response.  Some lesions more hypermetabolic, others improved.    02/05/2016 Treatment Plan Change    D/C Herceptin.  Faslodex as scheduled on 02/12/2016, then discontinued.  Continue Zometa.    02/05/2016 Treatment Plan Change    Prescriptions for Xeloda 7 days on and 7 days off and Tykerb printed and provided for authorization.    02/19/2016 -  Chemotherapy    Xeloda 2300 mg BID 7 days on and 7 days off and Tykerb     02/24/2016 Treatment Plan Change    Xeloda dose reduced by 10% to 2000 mg BID week on and week off.    03/18/2016 Treatment Plan Change    Xeloda dose reduced to 2000 mg in AM and 1500 mg in PM 7 days on and 7 days off.    03/23/2016 Imaging    MUGA- Normal LEFT ventricular ejection fraction of 56% not  significantly changed from 58% on previous exam.    05/19/2016 Imaging    MRI brain- . No new focus of abnormal enhancement to suggest interval metastatic disease. 2. No acute intracranial abnormality. 3. Stable foci of T2 FLAIR hyperintensity in white matter and in the right caudate head compatible with posttreatment changes and treated  metastasis.    05/20/2016 PET scan    1. The multiple osseous metastatic lesions shown to be hypermetabolic on the prior exam are reduced in activity or even resolved in hypermetabolic activity compared to prior, as detailed above. There are also numerous sclerotic bony lesions wedge are not currently and were not previously hypermetabolic, compatible with old non active lesions. 2. No findings of extra osseous metastatic disease currently. 3. Hepatic pseudocirrhosis. 4. Healing rib fractures. Chronic pathologic fracture the left acromion.    07/08/2016 Imaging    MUGA- Low normal LEFT ventricular ejection fraction of 53%, little changed since the 56% on 03/23/2016 but slightly decreased from the 58% on 01/01/2016.    09/07/2016 Echocardiogram    MUGA- Normal LEFT ventricular ejection fraction of 59% with normal LV wall motion.    09/07/2016 Treatment Plan Change    Xeloda dose reduced to 1500 mg BID 7 days on and 7 days off.    09/15/2016 PET scan    No significant change in diffuse sclerotic bone metastases on CT images, although several show mildly increased FDG uptake since prior study.  No evidence of soft tissue metastatic disease.    11/02/2016 Imaging    MRI brain- No change from the prior study. Previously noted metastatic deposits have been treated. No new lesions identified.    07/23/2017 -  Chemotherapy    Herceptin + Tykerb      Problem List Patient Active Problem List   Diagnosis Date Noted  . Mouth sores [K13.79] 03/18/2016  . Esophageal reflux [K21.9]   . Metastatic breast cancer ([place]) [C50.919]   . HCAP  (healthcare-associated pneumonia) [J18.9] 06/02/2015  . Pneumonia [J18.9] 06/02/2015  . Drug-induced cardiomyopathy ([place]) [I42.7] 02/15/2015  . Brain metastases ([place]) [C79.31] 01/29/2015  . Genetic testing [Z13.79] 09/05/2014  . Family history of prostate cancer [Z80.42]   . GERD (gastroesophageal reflux disease) [K21.9] 07/16/2014  . DVT (deep venous thrombosis) ([place]) [I82.409] 07/09/2014  . Bone metastases ([place]) [C79.51] 05/23/2014  . Breast cancer, stage 4 ([place]) [C50.919] 05/16/2014  . Hyperlipidemia [E78.5] 04/23/2014  . Depression [F32.9] 04/23/2014    Past Medical History Past Medical History:  Diagnosis Date  . Anticoagulated    xarelto  . Anxiety   . Breast cancer metastasized to multiple sites [facility])    liver, brain, and bone  . Breast cancer, stage 4 [facility]) oncologist-  dr Larene [place] ([facility])   dx 12/ 2015 -- breast cancer Stage 4,  ER/HER2 +,  w/  liver, brain and  bone mets/  chemotherapy and radiation therapy  . Chronic pain syndrome    secondary to cancer   . Depression   . Drug-induced cardiomyopathy ([place])    per last MUGA (08-08-2015), ef 56.5/ per last echo 05-18-2014 ef 60-65%  . Family history of prostate cancer   . GERD (gastroesophageal reflux disease)   . History of colon polyps    07-13-2013  benign  . History of DVT (deep vein thrombosis)    07-09-2014  upper right extremity-  RIJ and right subclavian--  resolved  . History of gastritis    erosive  . History of pneumonia    HCAP 06-07-2015--  resolved per cxr 07-04-2015  . History of radiation therapy    12 fractions to L3 - S3, 30Gy and left spacula 20Gy (06-20-2014 to 07-10-2014) //  whole brain rxt (10-09-2014 to 10-26-2014)  . History of small bowel obstruction    S/P RESECTION 2008  . Migraine   .  PONV (postoperative nausea and vomiting)    pt states scope patch does well    Past Surgical History Past Surgical History:  Procedure Laterality Date  . BREAST REDUCTION SURGERY   03/17/2011   Procedure: MAMMARY REDUCTION BILATERAL (BREAST);  Surgeon: Mary A Contogiannis;  Location: [place];  Service: Plastics;  Laterality: Bilateral;  . CATARACT EXTRACTION W/ INTRAOCULAR LENS  IMPLANT, BILATERAL  2008  . CERVICAL FUSION  2003   C5 -- C6  . COLONOSCOPY N/A 07/13/2013   Procedure: COLONOSCOPY;  Surgeon: Rogene Houston, MD;  [facility];  Service: Endoscopy;  Laterality: N/A;  930  . COLONOSCOPY N/A 11/26/2014   Procedure: COLONOSCOPY;  Surgeon: Rogene Houston, MD;  [facility];  Service: Endoscopy;  Laterality: N/A;  730  . D & C HYSTEROSCOPY/  RESECTION ENDOMETRIAL MASS/  [place] ENDOMETRIAL ABLATION  04-11-2010  . DX LAPAROSCOPY W/ PARTIAL SMALL BOWEL RESECTION AND APPENDECTOMY  04-13-2007  . ESOPHAGOGASTRODUODENOSCOPY N/A 05/25/2014   Procedure: ESOPHAGOGASTRODUODENOSCOPY (EGD);  Surgeon: Rogene Houston, MD;  [facility];  Service: Endoscopy;  Laterality: N/A;  155  . KNEE ARTHROSCOPY Right 2005  . LAPAROSCOPIC ASSISTED VAGINAL HYSTERECTOMY  10-13-2010   w/ Bx Left Fallopian tube and Aspiration Right Ovarian Cyst  . LAPAROSCOPIC CHOLECYSTECTOMY  11-17-2002  . LAPAROSCOPIC SALPINGO OOPHERECTOMY Bilateral 08/26/2015   Procedure: LAPAROSCOPIC SALPINGO OOPHORECTOMY, bilateral;  Surgeon: Everlene Farrier, MD;  Location: [place];  Service: Gynecology;  Laterality: Bilateral;  . PORTACATH PLACEMENT  05-17-2014  . REDUCTION MAMMAPLASTY    . SHOULDER ARTHROSCOPY WITH ROTATOR CUFF REPAIR Right 2002  . TRANSTHORACIC ECHOCARDIOGRAM  05-18-2014   ef 60-65%//   last MUGA  (08-08-2015)  ef 56.6%      Family History Family History  Problem Relation Age of Onset  . Diabetes Father   . Heart attack Maternal Grandmother 30       multiple over lifetime.  . Cancer Maternal Grandmother 59       NOS  . Prostate cancer Maternal Grandfather        dx in his 46s  . Lung cancer Paternal Grandfather        dx <50   . Lymphoma Maternal Aunt        dx in her 55s  . Melanoma Cousin 74       maternal first cousin  . Brain cancer Cousin        paternal first cousin dx under 50  . Prostate cancer Other        MGF's father  . Colon cancer Other        MGM's mother     Social History  reports that she quit smoking about 23 years ago. Her smoking use included cigarettes. She has never used smokeless tobacco. She reports that she drinks alcohol. She reports that she does not use drugs.  Medications  Current Outpatient Medications:  .  acetaminophen (TYLENOL) 325 MG tablet, Take 650 mg by mouth every 6 (six) hours as needed for moderate pain or headache., Disp: , Rfl:  .  calcium carbonate (TUMS - DOSED IN MG ELEMENTAL CALCIUM) 500 MG chewable tablet, Chew 2 tablets by mouth daily. , Disp: , Rfl:  .  Calcium-Phosphorus-Vitamin D (CALCIUM/D3 ADULT GUMMIES PO), Take 2 tablets by mouth 2 (two) times daily. dosage of calcium 1267m and vitamin d 10088m Disp: , Rfl:  .  desvenlafaxine (PRISTIQ) 100 MG 24 hr tablet, Take 1 tablet (  100 mg total) by mouth daily., Disp: 90 tablet, Rfl: 1 .  dicyclomine (BENTYL) 20 MG tablet, Take 1 tablet (20 mg total) by mouth 4 (four) times daily -  before meals and at bedtime., Disp: 40 tablet, Rfl: 0 .  diphenhydrAMINE (BENADRYL) 25 mg capsule, Take 25 mg by mouth at bedtime as needed for itching. , Disp: , Rfl:  .  diphenoxylate-atropine (LOMOTIL) 2.5-0.025 MG tablet, Take 2 tablets by mouth 4 (four) times daily as needed for diarrhea or loose stools., Disp: 60 tablet, Rfl: 1 .  DUREZOL 0.05 % EMUL, INSTILL ONE DROP IN THE RIGHT EYE TWICE DAILY TIME ONE WEEK THEN ONCE DAILY, Disp: , Rfl: 2 .  furosemide (LASIX) 20 MG tablet, Take 1 tablet (20 mg total) by mouth daily., Disp: 30 tablet, Rfl: 2 .  gabapentin (NEURONTIN) 300 MG capsule, TAKE TWO CAPSULES BY MOUTH AT BEDTIME, Disp: 60 capsule, Rfl: 5 .  lapatinib (TYKERB) 250 MG tablet, Take 4 tablets (1,000 mg total) by mouth  daily., Disp: 120 tablet, Rfl: 0 .  lidocaine-prilocaine (EMLA) cream, Apply 1 application topically daily as needed (apply to port before chemo)., Disp: 30 g, Rfl: 2 .  LORazepam (ATIVAN) 0.5 MG tablet, Take 1 tablet (0.5 mg total) by mouth every 6 (six) hours as needed. for anxiety, Disp: 30 tablet, Rfl: 2 .  Melatonin 10 MG CAPS, Take 1 tablet by mouth at bedtime. Pt states she takes one tablet at night for sleep , Disp: , Rfl:  .  morphine (MS CONTIN) 30 MG 12 hr tablet, TAKE 1 TABLET BY MOUTH EVERY TWELVE HOURS, Disp: 60 tablet, Rfl: 0 .  NARCAN 4 MG/0.1ML LIQD nasal spray kit, CALL 911. ADMINISTER A SINGLE SPRAY OF NARCAN IN ONE NOSTRIL, REPEAT EVERY 3 MINUTES AS NEEDED IF NO OR MINIMAL RESPONSE, Disp: , Rfl: 99 .  ondansetron (ZOFRAN) 8 MG tablet, TAKE ONE TABLET BY MOUTH EVERY 8 HOURS AS NEEDED FOR NAUSEA AND VOMITING, Disp: , Rfl: 4 .  oxyCODONE (OXY IR/ROXICODONE) 5 MG immediate release tablet, Take 1-2 tablets (5-10 mg total) by mouth every 4 (four) hours as needed for moderate pain., Disp: 60 tablet, Rfl: 0 .  pantoprazole (PROTONIX) 40 MG tablet, TAKE ONE TABLET BY MOUTH EVERY TWELVE HOURS, Disp: 60 tablet, Rfl: 3 .  potassium chloride SA (K-DUR,KLOR-CON) 20 MEQ tablet, Take 1 tablet (20 mEq total) by mouth daily., Disp: 30 tablet, Rfl: 3 .  promethazine (PHENERGAN) 25 MG tablet, TAKE 1 TABLET BY MOUTH EVERY SIX HOURS AS NEEDED FOR NAUSEA OR VOMITING, Disp: 30 tablet, Rfl: 6 .  rivaroxaban (XARELTO) 20 MG TABS tablet, TAKE 1 TABLET BY MOUTH EVERY DAY WITH SUPPER, Disp: 30 tablet, Rfl: 3 .  spironolactone (ALDACTONE) 25 MG tablet, Take 1 tablet (25 mg total) by mouth daily., Disp: 30 tablet, Rfl: 3 .  traZODone (DESYREL) 50 MG tablet, Take 1-2 tablets at bedtime for sleep, Disp: 180 tablet, Rfl: 0 .  Zoledronic Acid (ZOMETA IV), Inject into the vein every 3 (three) months., Disp: , Rfl:  .  zolpidem (AMBIEN) 10 MG tablet, Take 1 tablet (10 mg total) by mouth at bedtime as needed for  sleep., Disp: 30 tablet, Rfl: 1 No current facility-administered medications for this visit.   Facility-Administered Medications Ordered in Other Visits:  .  sodium chloride flush (NS) 0.9 % injection 10 mL, 10 mL, Intracatheter, PRN, Amena Dockham, MD, 10 mL at 01/21/18 1055  Allergies Patient has no known allergies.  Review of Systems Review  of Systems - Oncology ROS negative other than insomnia   Physical Exam  Vitals Wt Readings from Last 3 Encounters:  01/21/18 262 lb 12.8 oz (119.2 kg)  12/31/17 263 lb 6.4 oz (119.5 kg)  12/10/17 262 lb (118.8 kg)   Temp Readings from Last 3 Encounters:  01/21/18 97.6 F (36.4 C) (Oral)  12/31/17 98.2 F (36.8 C) (Oral)  12/31/17 98 F (36.7 C) (Oral)   BP Readings from Last 3 Encounters:  01/21/18 (!) 114/42  12/31/17 (!) 118/54  12/31/17 135/61   Pulse Readings from Last 3 Encounters:  01/21/18 85  12/31/17 61  12/31/17 72   Constitutional: Well-developed, well-nourished, and in no distress.   HENT: Head: Normocephalic and atraumatic.  Mouth/Throat: No oropharyngeal exudate. Mucosa moist. Eyes: Pupils are equal, round, and reactive to light. Conjunctivae are normal. No scleral icterus.  Neck: Normal range of motion. Neck supple. No JVD present.  Cardiovascular: Normal rate, regular rhythm and normal heart sounds.  Exam reveals no gallop and no friction rub.   No murmur heard. Pulmonary/Chest: Effort normal and breath sounds normal. No respiratory distress. No wheezes.No rales.  Abdominal: Soft. Bowel sounds are normal. No distension. There is no tenderness. There is no guarding.  Musculoskeletal: No edema or tenderness.  Lymphadenopathy:No cervical, axillary or supraclavicular adenopathy.  Neurological: Alert and oriented to person, place, and time. No cranial nerve deficit.  Skin: Skin is warm and dry. No rash noted. No erythema. No pallor.  Psychiatric: Affect and judgment normal.  Breast exam:  Chaperone present.   Evidence of prior breast surgeries.    Labs Infusion on 01/21/2018  Component Date Value Ref Range Status  . Sodium 01/21/2018 139  135 - 145 mmol/L Final  . Potassium 01/21/2018 3.1* 3.5 - 5.1 mmol/L Final  . Chloride 01/21/2018 99  98 - 111 mmol/L Final  . CO2 01/21/2018 32  22 - 32 mmol/L Final  . Glucose, Bld 01/21/2018 160* 70 - 99 mg/dL Final  . BUN 01/21/2018 8  6 - 20 mg/dL Final  . Creatinine, Ser 01/21/2018 1.00  0.44 - 1.00 mg/dL Final  . Calcium 01/21/2018 8.8* 8.9 - 10.3 mg/dL Final  . Total Protein 01/21/2018 6.8  6.5 - 8.1 g/dL Final  . Albumin 01/21/2018 3.5  3.5 - 5.0 g/dL Final  . AST 01/21/2018 22  15 - 41 U/L Final  . ALT 01/21/2018 18  0 - 44 U/L Final  . Alkaline Phosphatase 01/21/2018 96  38 - 126 U/L Final  . Total Bilirubin 01/21/2018 0.5  0.3 - 1.2 mg/dL Final  . GFR calc non Af Amer 01/21/2018 >60  >60 mL/min Final  . GFR calc Af Amer 01/21/2018 >60  >60 mL/min Final   Comment: (NOTE) The eGFR has been calculated using the CKD EPI equation. This calculation has not been validated in all clinical situations. eGFR's persistently <60 mL/min signify possible Chronic Kidney Disease.   Georgiann Hahn gap 01/21/2018 8  5 - 15 Final   Performed at [facility], [address], [place], [place] [address]  . Magnesium 01/21/2018 2.0  1.7 - 2.4 mg/dL Final   Performed at [facility], [address], [place], [place] [address]     Pathology Orders Placed This Encounter  Procedures  . NM PET Image Restag (PS) Skull Base To Thigh    Standing Status:   Future    Standing Expiration Date:   01/21/2019    Order Specific Question:   If indicated for the ordered  procedure, I authorize the administration of a radiopharmaceutical per Radiology protocol    Answer:   Yes    Order Specific Question:   Is the patient pregnant?    Answer:   No    Order Specific Question:   Preferred imaging location?    Answer:   [facility]    Order Specific Question:   Radiology  Contrast Protocol - do NOT remove file path    Answer:   [email or web address]  . MR Brain W Wo Contrast    Standing Status:   Future    Standing Expiration Date:   01/21/2019    Order Specific Question:   If indicated for the ordered procedure, I authorize the administration of contrast media per Radiology protocol    Answer:   Yes    Order Specific Question:   What is the patient's sedation requirement?    Answer:   No Sedation    Order Specific Question:   Does the patient have a pacemaker or implanted devices?    Answer:   No    Order Specific Question:   Use SRS Protocol?    Answer:   No    Order Specific Question:   Radiology Contrast Protocol - do NOT remove file path    Answer:   [email or web address]    Order Specific Question:   Preferred imaging location?    Answer:   [facility] (table limit-350lbs)  . Magnesium    Standing Status:   Future    Standing Expiration Date:   01/21/2019  . Basic metabolic panel    Standing Status:   Future    Standing Expiration Date:   01/21/2019  . CBC with Differential/Platelet    Standing Status:   Future    Standing Expiration Date:   01/22/2019  . Comprehensive metabolic panel    Standing Status:   Future    Standing Expiration Date:   01/22/2019  . Lactate dehydrogenase    Standing Status:   Future    Standing Expiration Date:   01/22/2019       Zoila Shutter MD

## 2018-01-25 ENCOUNTER — Other Ambulatory Visit (HOSPITAL_COMMUNITY): Payer: Self-pay | Admitting: Nurse Practitioner

## 2018-01-25 ENCOUNTER — Other Ambulatory Visit (HOSPITAL_COMMUNITY): Payer: Self-pay | Admitting: Hematology

## 2018-01-25 DIAGNOSIS — R600 Localized edema: Secondary | ICD-10-CM

## 2018-01-25 DIAGNOSIS — E876 Hypokalemia: Secondary | ICD-10-CM

## 2018-01-25 DIAGNOSIS — C50919 Malignant neoplasm of unspecified site of unspecified female breast: Secondary | ICD-10-CM

## 2018-01-28 ENCOUNTER — Telehealth (HOSPITAL_COMMUNITY): Payer: Self-pay | Admitting: Pharmacy Technician

## 2018-01-28 NOTE — Telephone Encounter (Signed)
Oral Oncology Patient Advocate Encounter  Prior Authorization for Tykerb has been approved.    PA# [id number] Effective dates: 01/28/2018 through 01/29/2019  I will inform [facility] of the approval so they can continue processing the prescription.  Oral Oncology Clinic will continue to follow.   [name] Patient [place] Phone [phone] Fax [phone] 01/28/2018 2:24 PM

## 2018-01-28 NOTE — Telephone Encounter (Signed)
Oral Oncology Patient Advocate Encounter  Received notification from [place] that the existing prior authorization for Tykerb is due for renewal.  Renewal PA submitted on CoverMyMeds Key [id number] Status is pending  Oral Oncology Clinic will continue to follow.  [name] Patient [place]  Phone [phone] Fax [phone] 01/28/2018 2:22 PM

## 2018-02-01 ENCOUNTER — Other Ambulatory Visit (HOSPITAL_COMMUNITY): Payer: Self-pay | Admitting: *Deleted

## 2018-02-01 ENCOUNTER — Ambulatory Visit (HOSPITAL_COMMUNITY)
Admission: RE | Admit: 2018-02-01 | Discharge: 2018-02-01 | Disposition: A | Discharge status: Home / Self Care | Payer: Medicare Other | Source: Ambulatory Visit | Attending: Orthopedic Surgery | Admitting: Orthopedic Surgery

## 2018-02-01 DIAGNOSIS — S42122K Displaced fracture of acromial process, left shoulder, subsequent encounter for fracture with nonunion: Secondary | ICD-10-CM | POA: Diagnosis not present

## 2018-02-01 DIAGNOSIS — R5382 Chronic fatigue, unspecified: Secondary | ICD-10-CM

## 2018-02-01 DIAGNOSIS — M7522 Bicipital tendinitis, left shoulder: Secondary | ICD-10-CM | POA: Diagnosis not present

## 2018-02-01 DIAGNOSIS — R600 Localized edema: Secondary | ICD-10-CM | POA: Diagnosis not present

## 2018-02-01 DIAGNOSIS — C7951 Secondary malignant neoplasm of bone: Secondary | ICD-10-CM

## 2018-02-01 DIAGNOSIS — Z0000 Encounter for general adult medical examination without abnormal findings: Secondary | ICD-10-CM

## 2018-02-01 DIAGNOSIS — C229 Malignant neoplasm of liver, not specified as primary or secondary: Secondary | ICD-10-CM

## 2018-02-01 DIAGNOSIS — M25512 Pain in left shoulder: Secondary | ICD-10-CM | POA: Diagnosis not present

## 2018-02-01 DIAGNOSIS — C50919 Malignant neoplasm of unspecified site of unspecified female breast: Secondary | ICD-10-CM

## 2018-02-01 MED ORDER — MORPHINE SULFATE ER 30 MG PO TBCR: EXTENDED_RELEASE_TABLET | ORAL | 0 refills | Status: DC

## 2018-02-03 ENCOUNTER — Telehealth (HOSPITAL_COMMUNITY): Payer: Self-pay | Admitting: *Deleted

## 2018-02-03 NOTE — Telephone Encounter (Signed)
Refill request for Trazodone received from [name]. Chart reviewed, patient last seen in June by Dr. Daron Offer, no future appointments with this practice. Will send to MD for approval/denial of 30 day supply.

## 2018-02-03 NOTE — Telephone Encounter (Signed)
Have the pt schedule an appt with one of our MD's. We can then refill the meds to that appt date

## 2018-02-04 NOTE — Telephone Encounter (Signed)
[name], administrative assistant contacted patient regarding an appointment.  Patient is calling Dr. Joycelyn Schmid new office to get set up there, per her choice and will contact us back if would like an appointment in our outpatient with a new provider.

## 2018-02-07 ENCOUNTER — Encounter (HOSPITAL_COMMUNITY): Payer: Medicare Other

## 2018-02-09 DIAGNOSIS — M25512 Pain in left shoulder: Secondary | ICD-10-CM | POA: Diagnosis not present

## 2018-02-11 ENCOUNTER — Other Ambulatory Visit (HOSPITAL_COMMUNITY): Payer: Self-pay | Admitting: Nurse Practitioner

## 2018-02-11 ENCOUNTER — Inpatient Hospital Stay (HOSPITAL_COMMUNITY): Payer: Medicare Other | Attending: Adult Health

## 2018-02-11 ENCOUNTER — Encounter (HOSPITAL_COMMUNITY): Payer: Self-pay

## 2018-02-11 ENCOUNTER — Other Ambulatory Visit: Payer: Self-pay

## 2018-02-11 VITALS — BP 110/61 | HR 66 | Temp 98.0°F | Resp 18 | Wt 263.0 lb

## 2018-02-11 DIAGNOSIS — Z79899 Other long term (current) drug therapy: Secondary | ICD-10-CM | POA: Diagnosis not present

## 2018-02-11 DIAGNOSIS — Z86718 Personal history of other venous thrombosis and embolism: Secondary | ICD-10-CM | POA: Insufficient documentation

## 2018-02-11 DIAGNOSIS — Z23 Encounter for immunization: Secondary | ICD-10-CM | POA: Insufficient documentation

## 2018-02-11 DIAGNOSIS — Z923 Personal history of irradiation: Secondary | ICD-10-CM | POA: Diagnosis not present

## 2018-02-11 DIAGNOSIS — Z87891 Personal history of nicotine dependence: Secondary | ICD-10-CM | POA: Diagnosis not present

## 2018-02-11 DIAGNOSIS — Z803 Family history of malignant neoplasm of breast: Secondary | ICD-10-CM | POA: Insufficient documentation

## 2018-02-11 DIAGNOSIS — Z170 Estrogen receptor positive status [ER+]: Secondary | ICD-10-CM | POA: Diagnosis not present

## 2018-02-11 DIAGNOSIS — Z90710 Acquired absence of both cervix and uterus: Secondary | ICD-10-CM | POA: Diagnosis not present

## 2018-02-11 DIAGNOSIS — G893 Neoplasm related pain (acute) (chronic): Secondary | ICD-10-CM | POA: Diagnosis not present

## 2018-02-11 DIAGNOSIS — Z86010 Personal history of colonic polyps: Secondary | ICD-10-CM | POA: Insufficient documentation

## 2018-02-11 DIAGNOSIS — E876 Hypokalemia: Secondary | ICD-10-CM

## 2018-02-11 DIAGNOSIS — C787 Secondary malignant neoplasm of liver and intrahepatic bile duct: Secondary | ICD-10-CM | POA: Diagnosis not present

## 2018-02-11 DIAGNOSIS — M545 Low back pain: Secondary | ICD-10-CM | POA: Diagnosis not present

## 2018-02-11 DIAGNOSIS — Z9221 Personal history of antineoplastic chemotherapy: Secondary | ICD-10-CM | POA: Diagnosis not present

## 2018-02-11 DIAGNOSIS — C50919 Malignant neoplasm of unspecified site of unspecified female breast: Secondary | ICD-10-CM

## 2018-02-11 DIAGNOSIS — C7931 Secondary malignant neoplasm of brain: Secondary | ICD-10-CM | POA: Insufficient documentation

## 2018-02-11 DIAGNOSIS — C7951 Secondary malignant neoplasm of bone: Secondary | ICD-10-CM | POA: Insufficient documentation

## 2018-02-11 DIAGNOSIS — E785 Hyperlipidemia, unspecified: Secondary | ICD-10-CM | POA: Diagnosis not present

## 2018-02-11 DIAGNOSIS — Z5112 Encounter for antineoplastic immunotherapy: Secondary | ICD-10-CM | POA: Insufficient documentation

## 2018-02-11 DIAGNOSIS — F418 Other specified anxiety disorders: Secondary | ICD-10-CM | POA: Diagnosis not present

## 2018-02-11 DIAGNOSIS — K219 Gastro-esophageal reflux disease without esophagitis: Secondary | ICD-10-CM | POA: Insufficient documentation

## 2018-02-11 DIAGNOSIS — Z90722 Acquired absence of ovaries, bilateral: Secondary | ICD-10-CM | POA: Insufficient documentation

## 2018-02-11 DIAGNOSIS — Z8042 Family history of malignant neoplasm of prostate: Secondary | ICD-10-CM | POA: Insufficient documentation

## 2018-02-11 DIAGNOSIS — Z809 Family history of malignant neoplasm, unspecified: Secondary | ICD-10-CM | POA: Insufficient documentation

## 2018-02-11 DIAGNOSIS — Z801 Family history of malignant neoplasm of trachea, bronchus and lung: Secondary | ICD-10-CM | POA: Insufficient documentation

## 2018-02-11 LAB — COMPREHENSIVE METABOLIC PANEL
ALT: 17 U/L (ref 0–44)
ANION GAP: 9 (ref 5–15)
AST: 21 U/L (ref 15–41)
Albumin: 3.7 g/dL (ref 3.5–5.0)
Alkaline Phosphatase: 95 U/L (ref 38–126)
BUN: 12 mg/dL (ref 6–20)
CO2: 30 mmol/L (ref 22–32)
Calcium: 9.2 mg/dL (ref 8.9–10.3)
Chloride: 100 mmol/L (ref 98–111)
Creatinine, Ser: 0.99 mg/dL (ref 0.44–1.00)
GFR calc Af Amer: >60 mL/min (ref >60)
GFR calc non Af Amer: >60 mL/min (ref >60)
GLUCOSE: 113 mg/dL — AB (ref 70–99)
POTASSIUM: 3.6 mmol/L (ref 3.5–5.1)
SODIUM: 139 mmol/L (ref 135–145)
Total Bilirubin: 0.6 mg/dL (ref 0.3–1.2)
Total Protein: 7 g/dL (ref 6.5–8.1)

## 2018-02-11 LAB — LACTATE DEHYDROGENASE: LDH: 160 U/L (ref 98–192)

## 2018-02-11 LAB — CBC WITH DIFFERENTIAL/PLATELET
BASOS ABS: 0 10*3/uL (ref 0.0–0.1)
Basophils Relative: 0 %
Eosinophils Absolute: 0 10*3/uL (ref 0.0–0.7)
Eosinophils Relative: 0 %
HEMATOCRIT: 34.4 % — AB (ref 36.0–46.0)
Hemoglobin: 10.4 g/dL — ABNORMAL LOW (ref 12.0–15.0)
LYMPHS ABS: 0.9 10*3/uL (ref 0.7–4.0)
LYMPHS PCT: 15 %
MCH: 24.9 pg — AB (ref 26.0–34.0)
MCHC: 30.2 g/dL (ref 30.0–36.0)
MCV: 82.5 fL (ref 78.0–100.0)
MONO ABS: 0.4 10*3/uL (ref 0.1–1.0)
MONOS PCT: 7 %
Neutro Abs: 4.4 10*3/uL (ref 1.7–7.7)
Neutrophils Relative %: 78 %
Platelets: 276 10*3/uL (ref 150–400)
RBC: 4.17 MIL/uL (ref 3.87–5.11)
RDW: 17.1 % — AB (ref 11.5–15.5)
WBC: 5.7 10*3/uL (ref 4.0–10.5)

## 2018-02-11 MED ORDER — DIPHENHYDRAMINE HCL 25 MG PO CAPS
50.0000 mg | ORAL_CAPSULE | Freq: Once | ORAL | Status: AC
  Administered 2018-02-11: 50 mg via ORAL
  Filled 2018-02-11: qty 2

## 2018-02-11 MED ORDER — HEPARIN SOD (PORK) LOCK FLUSH 100 UNIT/ML IV SOLN
INTRAVENOUS | Status: AC
  Filled 2018-02-11: qty 5

## 2018-02-11 MED ORDER — HEPARIN SOD (PORK) LOCK FLUSH 100 UNIT/ML IV SOLN
500.0000 [IU] | Freq: Once | INTRAVENOUS | Status: AC | PRN
  Administered 2018-02-11: 500 [IU]

## 2018-02-11 MED ORDER — ACETAMINOPHEN 325 MG PO TABS
650.0000 mg | ORAL_TABLET | Freq: Once | ORAL | Status: AC
  Administered 2018-02-11: 650 mg via ORAL
  Filled 2018-02-11: qty 2

## 2018-02-11 MED ORDER — TRASTUZUMAB CHEMO 150 MG IV SOLR
750.0000 mg | Freq: Once | INTRAVENOUS | Status: AC
  Administered 2018-02-11: 750 mg via INTRAVENOUS
  Filled 2018-02-11: qty 35.72

## 2018-02-11 MED ORDER — SODIUM CHLORIDE 0.9 % IV SOLN
Freq: Once | INTRAVENOUS | Status: AC
  Administered 2018-02-11: 11:00:00 via INTRAVENOUS

## 2018-02-11 MED ORDER — INFLUENZA VAC SPLIT QUAD 0.5 ML IM SUSY
0.5000 mL | PREFILLED_SYRINGE | Freq: Once | INTRAMUSCULAR | Status: AC
  Administered 2018-02-11: 0.5 mL via INTRAMUSCULAR

## 2018-02-11 MED ORDER — POTASSIUM CHLORIDE CRYS ER 20 MEQ PO TBCR: 20.0000 meq | EXTENDED_RELEASE_TABLET | Freq: Every day | ORAL | 3 refills | Status: DC

## 2018-02-11 MED ORDER — RIVAROXABAN 20 MG PO TABS: ORAL_TABLET | ORAL | 3 refills | Status: DC

## 2018-02-11 NOTE — Progress Notes (Signed)
Tolerated infusion w/o adverse reaction.  Alert, in no distress.  VSS.  Discharged ambulatory in c/o spouse.  

## 2018-02-21 ENCOUNTER — Encounter (HOSPITAL_COMMUNITY)
Admission: RE | Admit: 2018-02-21 | Discharge: 2018-02-21 | Disposition: A | Discharge status: Home / Self Care | Payer: Medicare Other | Source: Ambulatory Visit | Attending: Internal Medicine | Admitting: Internal Medicine

## 2018-02-21 DIAGNOSIS — C7931 Secondary malignant neoplasm of brain: Secondary | ICD-10-CM | POA: Diagnosis not present

## 2018-02-21 DIAGNOSIS — C50919 Malignant neoplasm of unspecified site of unspecified female breast: Secondary | ICD-10-CM | POA: Diagnosis not present

## 2018-02-21 DIAGNOSIS — C7951 Secondary malignant neoplasm of bone: Secondary | ICD-10-CM | POA: Diagnosis not present

## 2018-02-21 MED ORDER — FLUDEOXYGLUCOSE F - 18 (FDG) INJECTION
15.8000 | Freq: Once | INTRAVENOUS | Status: AC | PRN
  Administered 2018-02-21: 15.8 via INTRAVENOUS

## 2018-02-23 ENCOUNTER — Ambulatory Visit (HOSPITAL_COMMUNITY)
Admission: RE | Admit: 2018-02-23 | Discharge: 2018-02-23 | Disposition: A | Discharge status: Home / Self Care | Payer: Medicare Other | Source: Ambulatory Visit | Attending: Internal Medicine | Admitting: Internal Medicine

## 2018-02-23 DIAGNOSIS — C7931 Secondary malignant neoplasm of brain: Secondary | ICD-10-CM | POA: Insufficient documentation

## 2018-02-23 DIAGNOSIS — C50919 Malignant neoplasm of unspecified site of unspecified female breast: Secondary | ICD-10-CM | POA: Insufficient documentation

## 2018-02-23 MED ORDER — GADOBUTROL 1 MMOL/ML IV SOLN
10.0000 mL | Freq: Once | INTRAVENOUS | Status: AC | PRN
  Administered 2018-02-23: 10 mL via INTRAVENOUS

## 2018-02-24 ENCOUNTER — Encounter (HOSPITAL_COMMUNITY): Payer: Self-pay | Admitting: Internal Medicine

## 2018-02-24 ENCOUNTER — Other Ambulatory Visit: Payer: Self-pay

## 2018-02-24 ENCOUNTER — Inpatient Hospital Stay (HOSPITAL_BASED_OUTPATIENT_CLINIC_OR_DEPARTMENT_OTHER): Payer: Medicare Other | Admitting: Internal Medicine

## 2018-02-24 DIAGNOSIS — E785 Hyperlipidemia, unspecified: Secondary | ICD-10-CM | POA: Diagnosis not present

## 2018-02-24 DIAGNOSIS — E876 Hypokalemia: Secondary | ICD-10-CM

## 2018-02-24 DIAGNOSIS — Z803 Family history of malignant neoplasm of breast: Secondary | ICD-10-CM

## 2018-02-24 DIAGNOSIS — Z170 Estrogen receptor positive status [ER+]: Secondary | ICD-10-CM | POA: Diagnosis not present

## 2018-02-24 DIAGNOSIS — C787 Secondary malignant neoplasm of liver and intrahepatic bile duct: Secondary | ICD-10-CM | POA: Diagnosis not present

## 2018-02-24 DIAGNOSIS — Z86718 Personal history of other venous thrombosis and embolism: Secondary | ICD-10-CM

## 2018-02-24 DIAGNOSIS — C7931 Secondary malignant neoplasm of brain: Secondary | ICD-10-CM | POA: Diagnosis not present

## 2018-02-24 DIAGNOSIS — Z8042 Family history of malignant neoplasm of prostate: Secondary | ICD-10-CM

## 2018-02-24 DIAGNOSIS — C7951 Secondary malignant neoplasm of bone: Secondary | ICD-10-CM | POA: Diagnosis not present

## 2018-02-24 DIAGNOSIS — Z923 Personal history of irradiation: Secondary | ICD-10-CM

## 2018-02-24 DIAGNOSIS — Z90722 Acquired absence of ovaries, bilateral: Secondary | ICD-10-CM

## 2018-02-24 DIAGNOSIS — Z86010 Personal history of colonic polyps: Secondary | ICD-10-CM

## 2018-02-24 DIAGNOSIS — Z79899 Other long term (current) drug therapy: Secondary | ICD-10-CM

## 2018-02-24 DIAGNOSIS — Z23 Encounter for immunization: Secondary | ICD-10-CM | POA: Diagnosis not present

## 2018-02-24 DIAGNOSIS — G893 Neoplasm related pain (acute) (chronic): Secondary | ICD-10-CM

## 2018-02-24 DIAGNOSIS — F418 Other specified anxiety disorders: Secondary | ICD-10-CM | POA: Diagnosis not present

## 2018-02-24 DIAGNOSIS — C50919 Malignant neoplasm of unspecified site of unspecified female breast: Secondary | ICD-10-CM

## 2018-02-24 DIAGNOSIS — M545 Low back pain: Secondary | ICD-10-CM

## 2018-02-24 DIAGNOSIS — K219 Gastro-esophageal reflux disease without esophagitis: Secondary | ICD-10-CM

## 2018-02-24 DIAGNOSIS — Z87891 Personal history of nicotine dependence: Secondary | ICD-10-CM

## 2018-02-24 DIAGNOSIS — Z801 Family history of malignant neoplasm of trachea, bronchus and lung: Secondary | ICD-10-CM

## 2018-02-24 DIAGNOSIS — Z5112 Encounter for antineoplastic immunotherapy: Secondary | ICD-10-CM

## 2018-02-24 DIAGNOSIS — Z9221 Personal history of antineoplastic chemotherapy: Secondary | ICD-10-CM

## 2018-02-24 DIAGNOSIS — Z809 Family history of malignant neoplasm, unspecified: Secondary | ICD-10-CM

## 2018-02-24 DIAGNOSIS — Z90710 Acquired absence of both cervix and uterus: Secondary | ICD-10-CM

## 2018-02-24 DIAGNOSIS — R937 Abnormal findings on diagnostic imaging of other parts of musculoskeletal system: Secondary | ICD-10-CM

## 2018-02-24 LAB — COMPREHENSIVE METABOLIC PANEL
ALT: 17 U/L (ref 0–44)
ANION GAP: 8 (ref 5–15)
AST: 23 U/L (ref 15–41)
Albumin: 3.8 g/dL (ref 3.5–5.0)
Alkaline Phosphatase: 97 U/L (ref 38–126)
BILIRUBIN TOTAL: 0.1 mg/dL — AB (ref 0.3–1.2)
BUN: 10 mg/dL (ref 6–20)
CALCIUM: 9.2 mg/dL (ref 8.9–10.3)
CO2: 31 mmol/L (ref 22–32)
Chloride: 98 mmol/L (ref 98–111)
Creatinine, Ser: 0.91 mg/dL (ref 0.44–1.00)
GFR calc non Af Amer: >60 mL/min (ref >60)
GLUCOSE: 112 mg/dL — AB (ref 70–99)
POTASSIUM: 3.6 mmol/L (ref 3.5–5.1)
SODIUM: 137 mmol/L (ref 135–145)
TOTAL PROTEIN: 7.3 g/dL (ref 6.5–8.1)

## 2018-02-24 LAB — CBC WITH DIFFERENTIAL/PLATELET
ABS IMMATURE GRANULOCYTES: 0.03 10*3/uL (ref 0.00–0.07)
BASOS PCT: 0 %
Basophils Absolute: 0 10*3/uL (ref 0.0–0.1)
Eosinophils Absolute: 0 10*3/uL (ref 0.0–0.5)
Eosinophils Relative: 0 %
HEMATOCRIT: 35.9 % — AB (ref 36.0–46.0)
Hemoglobin: 10.5 g/dL — ABNORMAL LOW (ref 12.0–15.0)
IMMATURE GRANULOCYTES: 1 %
LYMPHS ABS: 1 10*3/uL (ref 0.7–4.0)
LYMPHS PCT: 18 %
MCH: 24 pg — AB (ref 26.0–34.0)
MCHC: 29.2 g/dL — ABNORMAL LOW (ref 30.0–36.0)
MCV: 82.2 fL (ref 80.0–100.0)
Monocytes Absolute: 0.4 10*3/uL (ref 0.1–1.0)
Monocytes Relative: 8 %
NEUTROS ABS: 3.8 10*3/uL (ref 1.7–7.7)
NEUTROS PCT: 73 %
Platelets: 270 10*3/uL (ref 150–400)
RBC: 4.37 MIL/uL (ref 3.87–5.11)
RDW: 16.7 % — ABNORMAL HIGH (ref 11.5–15.5)
WBC: 5.3 10*3/uL (ref 4.0–10.5)
nRBC: 0 % (ref 0.0–0.2)

## 2018-02-24 LAB — LACTATE DEHYDROGENASE: LDH: 155 U/L (ref 98–192)

## 2018-02-24 LAB — MAGNESIUM: Magnesium: 2 mg/dL (ref 1.7–2.4)

## 2018-02-24 MED ORDER — HEPARIN SOD (PORK) LOCK FLUSH 100 UNIT/ML IV SOLN
500.0000 [IU] | Freq: Once | INTRAVENOUS | Status: AC
  Administered 2018-02-24: 500 [IU] via INTRAVENOUS

## 2018-02-24 MED ORDER — SODIUM CHLORIDE 0.9% FLUSH
10.0000 mL | Freq: Once | INTRAVENOUS | Status: AC
  Administered 2018-02-24: 10 mL via INTRAVENOUS

## 2018-02-24 NOTE — Progress Notes (Signed)
Diagnosis Metastatic breast cancer ([place]) - Plan: Lactate dehydrogenase, Comprehensive metabolic panel, Cancer antigen 27.29, Cancer antigen 15-3, MR Thoracic Spine W Wo Contrast, Cancer antigen 15-3, Cancer antigen 27.29, CBC with Differential/Platelet, Magnesium  Brain metastases (HCC) - Plan: Lactate dehydrogenase, Comprehensive metabolic panel, CBC with Differential/Platelet  Abnormal findings on diagnostic imaging of other parts of musculoskeletal system - Plan: NM Bone Scan Whole Body  Staging Cancer Staging Breast cancer, stage 4 (HCC) Staging form: Breast, AJCC 7th Edition - Clinical stage from 05/27/2014: Stage IV (T0, N0, M1) - Signed by Baird Cancer, PA-C on 05/27/2014   Assessment and Plan:  1.  52 yr old female with Stage IV adenocarcinoma of breast, ER+, HER2 + with metastatic disease to brain and bone. She is S/P multiple lines of therapy. Primary tumor never identified. She is S/P BSO by Dr. Gaetano Net on 08/27/2015. Current therapy is Herceptin + Tykerb beginning on 07/23/2017 and she continues on bone targeted therapy with Zometa every 3 months to reduce SRE and its antitumor effect in bone. Restaging PET scan on 10/18/2017 demonstrated diffuse sclerotic bone metastases with mild improvement noted at several sites without any new or progressive metastatic disease or evidence of soft tissue metastatic disease. Restaging MRI brain on 10/27/2017 was negative for intracranial disease.  She remains on Tykerb and is tolerating therapy.  ECHO done 01/17/2018 shows EF 60-65%.      PET scan done 02/21/2018 reviewed and showed   IMPRESSION: 1. Multifocal hypermetabolic sclerotic bone metastases are identified. Some of these lesions appear increased in size in the interval including the right scapula and T10 vertebra. New abnormal uptake is identified within the T11 vertebra. Other lesions exhibit similar degrees of hypermetabolism. 2. No hypermetabolic nodal or solid organ  metastases  MRI of brain done 02/23/2018 reviewed and showed  IMPRESSION: Stable exam. Mild atrophy and small vessel disease. No acute intracranial abnormality.  Satisfactory post treatment changes. No new or enhancing lesions are Identified.  Long talk held with pt and husband.  I discussed with them PET scan reports a new area at T11.  She also has reported increase in lesion in right scapula and T10 vertebra.  Other lesions are stable and there is no evidence of solid organ mets.   I am recommending her for bone scan for further evaluation of the bone lesions noted on PET.  Also pt will undergo MRI of T-spine to further evaluate the spine lesions.  I have discussed with her I will discuss scans with Radiation once they have been reviewed.  She denies any significant right shoulder pain. She has low back pain but is ambulatory.    Pt has know problems with left arm and is scheduled for surgery in 04/2018.  Pt will continue Tykerb and Herceptin as directed.    2.  Headache/Brain Mets.  Pt reports this is improved.   MRI of brain done 10/2017 was negative.  She reports history of migraine  Follow-up with PCP or neurology for management.  She is on morphine.  MRI of brain for ongoing follow-up done 02/23/2018 shows IMPRESSION: Stable exam. Mild atrophy and small vessel disease. No acute intracranial abnormality.  Satisfactory post treatment changes. No new or enhancing lesions are Identified  Tykerb has been shown to have role for treatment of brain mets in Her 2 + breast cancer.    3.  Bone mets.  She is on Zometa every 3 months.  Continue Zometa as directed.  Pt is set up for bone  scan and MRI of thoracic spine based on PET findings.    4.  Hypokalemia.  Labs done 02/24/2018 reviewed and showed wBC 5.3 HB 10.5 plts 270,000.  Chemistries WNL with K+ 3.6 Cr 0.91 and normal LFTs.  Mg level WNL at 2.    25  minutes spent with more than 50% spent in counseling and coordination of care.     Interval History:  Historical data obtained from the note dated 10/29/2017.  52 yr old female with Stage IV adenocarcinoma of breast, ER+, HER2 + with metastatic disease to brain and bone. She is S/P multiple lines of therapy. Primary tumor never identified. She is S/P BSO by Dr. Gaetano Net on 08/27/2015. Current therapy is Herceptin + Tykerb beginning on 07/23/2017 and she continues on bone targeted therapy with Zometa every 3 months to reduce SRE and its antitumor effect in bone. Restaging PET scan on 10/18/2017 demonstrated diffuse sclerotic bone metastases with mild improvement noted at several sites without any new or progressive metastatic disease or evidence of soft tissue metastatic disease. Restaging MRI brain on 10/27/2017 was negative for intracranial disease.  Current Status:  Pt is seen today for follow-up to go over scans.  She reports her surgery will not be until December.  She reports some low back pain.  She denies any significant pain in right shoulder area.     Breast cancer, stage 4 ([place])   04/25/2014 Initial Diagnosis    Breast cancer, stage 4    04/25/2014 Imaging    CT abdomen/pelvis with widespread metastatic disease to the liver, multiple lytic lesions throughout spine and pelvis. No FX or epidural tumor identified    04/26/2014 Imaging    CT head unremarkable    04/26/2014 Imaging    CT chest with no lung mass or pulmonary nodules, no adenopathy. Lytic bone lesions, right 2nd rib    04/27/2014 Initial Biopsy    U/S guided liver biopsy, lesion in anterior and inferior left hepatic lobe biopsied    04/27/2014 Pathology Results    Metastatic adenocarcinoma, CK7, ER+, patchy positivity with PR. Possible primary includes breast, less likely gynecologic    05/15/2014 Mammogram    BI-RADS CATEGORY  2: Benign Finding(s)    05/16/2014 PET scan    1. Intensely hypermetabolic hepatic metastasis. 2. Widespread hypermetabolic skeletal lesions. 3. No primary adenocarcinoma identified  by FDG PET imaging.    05/19/2014 Imaging    MUGA- Left ventricular ejection fracture greater than 70%.    05/21/2014 Breast MRI    No suspicious masses or enhancement within the breasts. No axillary adenopathy.    05/22/2014 - 07/03/2014 Antibody Plan    Herceptin/Perjeta/Tamoxifen    06/12/2014 - 07/03/2014 Chemotherapy    Taxotere added secondary to persistent abdominal and back pain    06/17/2014 - 06/19/2014 Hospital Admission    Neutropenia, fever, diarrhea, nausea, vomiting    06/20/2014 - 07/10/2014 Radiation Therapy    Dr. Thea Silversmith 12 fractions to L3-S3 (30 Gy) and left scapula (20 Gy).     07/03/2014 Adverse Reaction    Perjeta- induced diarrhea.  Perjeta discontinued    07/16/2014 - 07/20/2014 Hospital Admission    Electrolyte abnormalities, and diarrhea.  Suspect Perjeta-induced diarrhea.  Negative GI work-up.    07/24/2014 - 08/19/2015 Chemotherapy    Herceptin/Tamoxifen/Xgeva    08/21/2014 Imaging    MUGA- Left ventricular ejection fraction equals 71%.    08/24/2014 PET scan    Dramatic reduction in metabolic activity of the widespread liver metastasis.  Liver metastasis now have metabolic activity equal to background normal liver activity. Liver has a nodular contour. Marked reduction in metabolic activity of skeletal lesions..    10/05/2014 Progression    Widespread metastatic disease to the brain as described. Between 20 and 30 intracranial metastatic deposits are now seen. No midline shift or incipient herniation    10/09/2014 - 10/26/2014 Radiation Therapy    Whole Brain XRT    11/14/2014 Imaging    MUGA- LVEF 67%    02/13/2015 Imaging    MUGA- LVEF 59%    02/15/2015 Treatment Plan Change    Due to declining LVEF, will hold Herceptin per PI guidelines.    04/12/2015 -  Chemotherapy    Herceptin restarted    06/02/2015 - 06/08/2015 Hospital Admission    Pneumonia    07/05/2015 Progression     PET/CT concern for mild progression of skeletal metastasis with several  lesions within the spine and 1 lesion in the Left iliac wing with mild to moderate metabolic activity new from prior. Rising CA 27-29    07/16/2015 - 10/23/2015 Anti-estrogen oral therapy    Arimidex    07/16/2015 Imaging    MRI brain with satisfactory post treatment apperance of brain. interval resolved enhancing R caudate metastasis, minimal punctate residual enhancing metastatic disease at the inferior L cerebellum. No new metastatic disease or new intracranial abnormality    07/19/2015 Treatment Plan Change    Discontinue Tamoxifen, Zoladex plus Arimidex.     08/27/2015 Procedure    Laparoscopic bilateral salpingo-oophorectomy by Dr. Gaetano Net    10/17/2015 PET scan    Osseous metastatic disease appears slightly progressive based on a new right scapular lesion and increased uptake within lesions in the thoracic spine, left iliac wing and  proximal right femur.    10/17/2015 Progression    Slight progression on PET scan imaging.    10/18/2015 Imaging    REsolved enhancing metastatic disease to the brain status post WBXRT    10/23/2015 - 02/12/2016 Adjuvant Chemotherapy    Faslodex loading followed by maintenance dose.  (Herceptin continued)    11/04/2015 Imaging    MUGA- LEFT ventricular ejection fraction 51% slightly decreased in a 57% on the previous exam.    12/24/2015 Treatment Plan Change    Zometa every 28 days.  Xgeva discontinued.      01/01/2016 Imaging    MUGA- Left ventricular ejection fraction equals 57.9%. This is increased from 51.1% previously.    02/03/2016 PET scan    1. Mixed metabolic changes in the scattered hypermetabolic sclerotic osseous metastases throughout the axial and proximal appendicular skeleton as detailed above. 2. No new sites of hypermetabolic metastatic disease. Stable pseudo-cirrhotic appearance of the liver due to treated liver metastases with no hypermetabolic liver metastases.     02/03/2016 Progression    PET shows mixed osseous response.  Some  lesions more hypermetabolic, others improved.    02/05/2016 Treatment Plan Change    D/C Herceptin.  Faslodex as scheduled on 02/12/2016, then discontinued.  Continue Zometa.    02/05/2016 Treatment Plan Change    Prescriptions for Xeloda 7 days on and 7 days off and Tykerb printed and provided for authorization.    02/19/2016 -  Chemotherapy    Xeloda 2300 mg BID 7 days on and 7 days off and Tykerb     02/24/2016 Treatment Plan Change    Xeloda dose reduced by 10% to 2000 mg BID week on and week off.    03/18/2016 Treatment Plan  Change    Xeloda dose reduced to 2000 mg in AM and 1500 mg in PM 7 days on and 7 days off.    03/23/2016 Imaging    MUGA- Normal LEFT ventricular ejection fraction of 56% not significantly changed from 58% on previous exam.    05/19/2016 Imaging    MRI brain- . No new focus of abnormal enhancement to suggest interval metastatic disease. 2. No acute intracranial abnormality. 3. Stable foci of T2 FLAIR hyperintensity in white matter and in the right caudate head compatible with posttreatment changes and treated metastasis.    05/20/2016 PET scan    1. The multiple osseous metastatic lesions shown to be hypermetabolic on the prior exam are reduced in activity or even resolved in hypermetabolic activity compared to prior, as detailed above. There are also numerous sclerotic bony lesions wedge are not currently and were not previously hypermetabolic, compatible with old non active lesions. 2. No findings of extra osseous metastatic disease currently. 3. Hepatic pseudocirrhosis. 4. Healing rib fractures. Chronic pathologic fracture the left acromion.    07/08/2016 Imaging    MUGA- Low normal LEFT ventricular ejection fraction of 53%, little changed since the 56% on 03/23/2016 but slightly decreased from the 58% on 01/01/2016.    09/07/2016 Echocardiogram    MUGA- Normal LEFT ventricular ejection fraction of 59% with normal LV wall motion.    09/07/2016  Treatment Plan Change    Xeloda dose reduced to 1500 mg BID 7 days on and 7 days off.    09/15/2016 PET scan    No significant change in diffuse sclerotic bone metastases on CT images, although several show mildly increased FDG uptake since prior study.  No evidence of soft tissue metastatic disease.    11/02/2016 Imaging    MRI brain- No change from the prior study. Previously noted metastatic deposits have been treated. No new lesions identified.    07/23/2017 -  Chemotherapy    Herceptin + Tykerb      Problem List Patient Active Problem List   Diagnosis Date Noted  . Mouth sores [K13.79] 03/18/2016  . Esophageal reflux [K21.9]   . Metastatic breast cancer ([place]) [C50.919]   . HCAP (healthcare-associated pneumonia) [J18.9] 06/02/2015  . Pneumonia [J18.9] 06/02/2015  . Drug-induced cardiomyopathy ([place]) [I42.7] 02/15/2015  . Brain metastases ([place]) [C79.31] 01/29/2015  . Genetic testing [Z13.79] 09/05/2014  . Family history of prostate cancer [Z80.42]   . GERD (gastroesophageal reflux disease) [K21.9] 07/16/2014  . DVT (deep venous thrombosis) ([place]) [I82.409] 07/09/2014  . Bone metastases ([place]) [C79.51] 05/23/2014  . Breast cancer, stage 4 ([place]) [C50.919] 05/16/2014  . Hyperlipidemia [E78.5] 04/23/2014  . Depression [F32.9] 04/23/2014    Past Medical History Past Medical History:  Diagnosis Date  . Anticoagulated    xarelto  . Anxiety   . Breast cancer metastasized to multiple sites [facility])    liver, brain, and bone  . Breast cancer, stage 4 [facility]) oncologist-  dr Larene [place] ([facility])   dx 12/ 2015 -- breast cancer Stage 4,  ER/HER2 +,  w/  liver, brain and  bone mets/  chemotherapy and radiation therapy  . Chronic pain syndrome    secondary to cancer   . Depression   . Drug-induced cardiomyopathy ([place])    per last MUGA (08-08-2015), ef 56.5/ per last echo 05-18-2014 ef 60-65%  . Family history of prostate cancer   . GERD (gastroesophageal reflux disease)    . History of colon polyps    07-13-2013  benign  . History of DVT (deep vein thrombosis)    07-09-2014  upper right extremity-  RIJ and right subclavian--  resolved  . History of gastritis    erosive  . History of pneumonia    HCAP 06-07-2015--  resolved per cxr 07-04-2015  . History of radiation therapy    12 fractions to L3 - S3, 30Gy and left spacula 20Gy (06-20-2014 to 07-10-2014) //  whole brain rxt (10-09-2014 to 10-26-2014)  . History of small bowel obstruction    S/P RESECTION 2008  . Migraine   . PONV (postoperative nausea and vomiting)    pt states scope patch does well    Past Surgical History Past Surgical History:  Procedure Laterality Date  . BREAST REDUCTION SURGERY  03/17/2011   Procedure: MAMMARY REDUCTION BILATERAL (BREAST);  Surgeon: Mary A Contogiannis;  Location: [place];  Service: Plastics;  Laterality: Bilateral;  . CATARACT EXTRACTION W/ INTRAOCULAR LENS  IMPLANT, BILATERAL  2008  . CERVICAL FUSION  2003   C5 -- C6  . COLONOSCOPY N/A 07/13/2013   Procedure: COLONOSCOPY;  Surgeon: Rogene Houston, MD;  [facility];  Service: Endoscopy;  Laterality: N/A;  930  . COLONOSCOPY N/A 11/26/2014   Procedure: COLONOSCOPY;  Surgeon: Rogene Houston, MD;  [facility];  Service: Endoscopy;  Laterality: N/A;  730  . D & C HYSTEROSCOPY/  RESECTION ENDOMETRIAL MASS/  [place] ENDOMETRIAL ABLATION  04-11-2010  . DX LAPAROSCOPY W/ PARTIAL SMALL BOWEL RESECTION AND APPENDECTOMY  04-13-2007  . ESOPHAGOGASTRODUODENOSCOPY N/A 05/25/2014   Procedure: ESOPHAGOGASTRODUODENOSCOPY (EGD);  Surgeon: Rogene Houston, MD;  [facility];  Service: Endoscopy;  Laterality: N/A;  155  . KNEE ARTHROSCOPY Right 2005  . LAPAROSCOPIC ASSISTED VAGINAL HYSTERECTOMY  10-13-2010   w/ Bx Left Fallopian tube and Aspiration Right Ovarian Cyst  . LAPAROSCOPIC CHOLECYSTECTOMY  11-17-2002  . LAPAROSCOPIC SALPINGO OOPHERECTOMY Bilateral 08/26/2015    Procedure: LAPAROSCOPIC SALPINGO OOPHORECTOMY, bilateral;  Surgeon: Everlene Farrier, MD;  Location: [place];  Service: Gynecology;  Laterality: Bilateral;  . PORTACATH PLACEMENT  05-17-2014  . REDUCTION MAMMAPLASTY    . SHOULDER ARTHROSCOPY WITH ROTATOR CUFF REPAIR Right 2002  . TRANSTHORACIC ECHOCARDIOGRAM  05-18-2014   ef 60-65%//   last MUGA  (08-08-2015)  ef 56.6%      Family History Family History  Problem Relation Age of Onset  . Diabetes Father   . Heart attack Maternal Grandmother 30       multiple over lifetime.  . Cancer Maternal Grandmother 33       NOS  . Prostate cancer Maternal Grandfather        dx in his 45s  . Lung cancer Paternal Grandfather        dx <50  . Lymphoma Maternal Aunt        dx in her 52s  . Melanoma Cousin 20       maternal first cousin  . Brain cancer Cousin        paternal first cousin dx under 31  . Prostate cancer Other        MGF's father  . Colon cancer Other        MGM's mother     Social History  reports that she quit smoking about 23 years ago. Her smoking use included cigarettes. She has never used smokeless tobacco. She reports that she drinks alcohol. She reports that she does not use drugs.  Medications  Current  Outpatient Medications:  .  acetaminophen (TYLENOL) 325 MG tablet, Take 650 mg by mouth every 6 (six) hours as needed for moderate pain or headache., Disp: , Rfl:  .  calcium carbonate (TUMS - DOSED IN MG ELEMENTAL CALCIUM) 500 MG chewable tablet, Chew 2 tablets by mouth daily. , Disp: , Rfl:  .  Calcium-Phosphorus-Vitamin D (CALCIUM/D3 ADULT GUMMIES PO), Take 2 tablets by mouth 2 (two) times daily. dosage of calcium 1236m and vitamin d 10044m Disp: , Rfl:  .  desvenlafaxine (PRISTIQ) 100 MG 24 hr tablet, Take 1 tablet (100 mg total) by mouth daily., Disp: 90 tablet, Rfl: 1 .  dicyclomine (BENTYL) 20 MG tablet, Take 1 tablet (20 mg total) by mouth 4 (four) times daily -  before meals and at bedtime.,  Disp: 40 tablet, Rfl: 0 .  diphenhydrAMINE (BENADRYL) 25 mg capsule, Take 25 mg by mouth at bedtime as needed for itching. , Disp: , Rfl:  .  diphenoxylate-atropine (LOMOTIL) 2.5-0.025 MG tablet, Take 2 tablets by mouth 4 (four) times daily as needed for diarrhea or loose stools., Disp: 60 tablet, Rfl: 1 .  DUREZOL 0.05 % EMUL, INSTILL ONE DROP IN THE RIGHT EYE TWICE DAILY TIME ONE WEEK THEN ONCE DAILY, Disp: , Rfl: 2 .  furosemide (LASIX) 20 MG tablet, TAKE 1 TABLET BY MOUTH DAILY, Disp: 30 tablet, Rfl: 2 .  gabapentin (NEURONTIN) 300 MG capsule, TAKE TWO CAPSULES BY MOUTH AT BEDTIME, Disp: 60 capsule, Rfl: 5 .  lapatinib (TYKERB) 250 MG tablet, Take 4 tablets (1,000 mg total) by mouth daily., Disp: 120 tablet, Rfl: 0 .  lidocaine-prilocaine (EMLA) cream, Apply 1 application topically daily as needed (apply to port before chemo)., Disp: 30 g, Rfl: 2 .  LORazepam (ATIVAN) 0.5 MG tablet, Take 1 tablet (0.5 mg total) by mouth every 6 (six) hours as needed. for anxiety, Disp: 30 tablet, Rfl: 2 .  Melatonin 10 MG CAPS, Take 1 tablet by mouth at bedtime. Pt states she takes one tablet at night for sleep , Disp: , Rfl:  .  morphine (MS CONTIN) 30 MG 12 hr tablet, TAKE 1 TABLET BY MOUTH EVERY TWELVE HOURS, Disp: 60 tablet, Rfl: 0 .  NARCAN 4 MG/0.1ML LIQD nasal spray kit, CALL 911. ADMINISTER A SINGLE SPRAY OF NARCAN IN ONE NOSTRIL, REPEAT EVERY 3 MINUTES AS NEEDED IF NO OR MINIMAL RESPONSE, Disp: , Rfl: 99 .  ondansetron (ZOFRAN) 8 MG tablet, TAKE ONE TABLET BY MOUTH EVERY 8 HOURS AS NEEDED FOR NAUSEA AND VOMITING, Disp: , Rfl: 4 .  oxyCODONE (OXY IR/ROXICODONE) 5 MG immediate release tablet, Take 1-2 tablets (5-10 mg total) by mouth every 4 (four) hours as needed for moderate pain., Disp: 60 tablet, Rfl: 0 .  pantoprazole (PROTONIX) 40 MG tablet, TAKE ONE TABLET BY MOUTH EVERY TWELVE HOURS, Disp: 60 tablet, Rfl: 3 .  potassium chloride SA (K-DUR,KLOR-CON) 20 MEQ tablet, Take 1 tablet (20 mEq total) by  mouth daily., Disp: 30 tablet, Rfl: 3 .  promethazine (PHENERGAN) 25 MG tablet, TAKE 1 TABLET BY MOUTH EVERY SIX HOURS AS NEEDED FOR NAUSEA OR VOMITING, Disp: 30 tablet, Rfl: 6 .  rivaroxaban (XARELTO) 20 MG TABS tablet, TAKE 1 TABLET BY MOUTH EVERY DAY WITH SUPPER, Disp: 30 tablet, Rfl: 3 .  spironolactone (ALDACTONE) 25 MG tablet, Take 1 tablet (25 mg total) by mouth daily., Disp: 30 tablet, Rfl: 3 .  Zoledronic Acid (ZOMETA IV), Inject into the vein every 3 (three) months., Disp: , Rfl:  .  zolpidem (AMBIEN) 10 MG tablet, Take 1 tablet (10 mg total) by mouth at bedtime as needed for sleep., Disp: 30 tablet, Rfl: 1  Allergies Patient has no known allergies.  Review of Systems Review of Systems - Oncology ROS negative other than back pain   Physical Exam  Vitals Wt Readings from Last 3 Encounters:  02/24/18 266 lb 8 oz (120.9 kg)  02/11/18 263 lb (119.3 kg)  01/21/18 262 lb 12.8 oz (119.2 kg)   Temp Readings from Last 3 Encounters:  02/24/18 98.9 F (37.2 C) (Oral)  02/11/18 98 F (36.7 C) (Oral)  01/21/18 97.6 F (36.4 C) (Oral)   BP Readings from Last 3 Encounters:  02/24/18 117/74  02/11/18 110/61  01/21/18 (!) 114/42   Pulse Readings from Last 3 Encounters:  02/24/18 71  02/11/18 66  01/21/18 85   Constitutional: Well-developed, well-nourished, and in no distress.   HENT: Head: Normocephalic and atraumatic.  Mouth/Throat: No oropharyngeal exudate. Mucosa moist. Eyes: Pupils are equal, round, and reactive to light. Conjunctivae are normal. No scleral icterus.  Neck: Normal range of motion. Neck supple. No JVD present.  Cardiovascular: Normal rate, regular rhythm and normal heart sounds.  Exam reveals no gallop and no friction rub.   No murmur heard. Pulmonary/Chest: Effort normal and breath sounds normal. No respiratory distress. No wheezes.No rales.  Abdominal: Soft. Bowel sounds are normal. No distension. There is no tenderness. There is no guarding.   Musculoskeletal: No tenderness along right scapula area.  Decreased ROM of left arm.   Lymphadenopathy: No cervical, axillary or supraclavicular adenopathy.  Neurological: Alert and oriented to person, place, and time. No cranial nerve deficit.  Skin: Skin is warm and dry. No rash noted. No erythema. No pallor.  Psychiatric: Affect and judgment normal.   Labs Office Visit on 02/24/2018  Component Date Value Ref Range Status  . LDH 02/24/2018 155  98 - 192 U/L Final   Performed at [facility], [address], [place], [place] [address]  . Sodium 02/24/2018 137  135 - 145 mmol/L Final  . Potassium 02/24/2018 3.6  3.5 - 5.1 mmol/L Final  . Chloride 02/24/2018 98  98 - 111 mmol/L Final  . CO2 02/24/2018 31  22 - 32 mmol/L Final  . Glucose, Bld 02/24/2018 112* 70 - 99 mg/dL Final  . BUN 02/24/2018 10  6 - 20 mg/dL Final  . Creatinine, Ser 02/24/2018 0.91  0.44 - 1.00 mg/dL Final  . Calcium 02/24/2018 9.2  8.9 - 10.3 mg/dL Final  . Total Protein 02/24/2018 7.3  6.5 - 8.1 g/dL Final  . Albumin 02/24/2018 3.8  3.5 - 5.0 g/dL Final  . AST 02/24/2018 23  15 - 41 U/L Final  . ALT 02/24/2018 17  0 - 44 U/L Final  . Alkaline Phosphatase 02/24/2018 97  38 - 126 U/L Final  . Total Bilirubin 02/24/2018 0.1* 0.3 - 1.2 mg/dL Final  . GFR calc non Af Amer 02/24/2018 >60  >60 mL/min Final  . GFR calc Af Amer 02/24/2018 >60  >60 mL/min Final   Comment: (NOTE) The eGFR has been calculated using the CKD EPI equation. This calculation has not been validated in all clinical situations. eGFR's persistently <60 mL/min signify possible Chronic Kidney Disease.   Georgiann Hahn gap 02/24/2018 8  5 - 15 Final   Performed at [facility], [address], [place], [place] [address]  . WBC 02/24/2018 5.3  4.0 - 10.5 K/uL Final  . RBC 02/24/2018 4.37  3.87 - 5.11 MIL/uL Final  . Hemoglobin 02/24/2018 10.5* 12.0 - 15.0 g/dL Final  . HCT 02/24/2018 35.9* 36.0 - 46.0 % Final  . MCV 02/24/2018 82.2  80.0 - 100.0 fL Final   . MCH 02/24/2018 24.0* 26.0 - 34.0 pg Final  . MCHC 02/24/2018 29.2* 30.0 - 36.0 g/dL Final  . RDW 02/24/2018 16.7* 11.5 - 15.5 % Final  . Platelets 02/24/2018 270  150 - 400 K/uL Final  . nRBC 02/24/2018 0.0  0.0 - 0.2 % Final  . Neutrophils Relative % 02/24/2018 73  % Final  . Neutro Abs 02/24/2018 3.8  1.7 - 7.7 K/uL Final  . Lymphocytes Relative 02/24/2018 18  % Final  . Lymphs Abs 02/24/2018 1.0  0.7 - 4.0 K/uL Final  . Monocytes Relative 02/24/2018 8  % Final  . Monocytes Absolute 02/24/2018 0.4  0.1 - 1.0 K/uL Final  . Eosinophils Relative 02/24/2018 0  % Final  . Eosinophils Absolute 02/24/2018 0.0  0.0 - 0.5 K/uL Final  . Basophils Relative 02/24/2018 0  % Final  . Basophils Absolute 02/24/2018 0.0  0.0 - 0.1 K/uL Final  . Immature Granulocytes 02/24/2018 1  % Final  . Abs Immature Granulocytes 02/24/2018 0.03  0.00 - 0.07 K/uL Final   Performed at [facility], [address], [place], [place] [address]  . Magnesium 02/24/2018 2.0  1.7 - 2.4 mg/dL Final   Performed at [facility], [address] [place], [place] [address]     Pathology Orders Placed This Encounter  Procedures  . NM Bone Scan Whole Body    Standing Status:   Future    Standing Expiration Date:   02/24/2019    Order Specific Question:   ** REASON FOR EXAM (FREE TEXT)    Answer:   scapula and spine lesions on recent PET    Order Specific Question:   If indicated for the ordered procedure, I authorize the administration of a radiopharmaceutical per Radiology protocol    Answer:   Yes    Order Specific Question:   Is the patient pregnant?    Answer:   No    Order Specific Question:   Preferred imaging location?    Answer:   [facility]    Order Specific Question:   Radiology Contrast Protocol - do NOT remove file path    Answer:   \\[email or web address]  . MR Thoracic Spine W Wo Contrast    Standing Status:   Future    Standing Expiration Date:   02/24/2019    Order  Specific Question:   ** REASON FOR EXAM (FREE TEXT)    Answer:   t10, t 11 lesion noted on PET    Order Specific Question:   GRA to provide read?    Answer:   Yes    Order Specific Question:   If indicated for the ordered procedure, I authorize the administration of contrast media per Radiology protocol    Answer:   Yes    Order Specific Question:   What is the patient's sedation requirement?    Answer:   No Sedation    Order Specific Question:   Use SRS Protocol?    Answer:   No    Order Specific Question:   Does the patient have a pacemaker or implanted devices?    Answer:   No    Order Specific Question:   Preferred imaging location?    Answer:   [facility] (table limit-350lbs)  Order Specific Question:   Radiology Contrast Protocol - do NOT remove file path    Answer:   \\[email or web address]\[email or web address]\[email or web address]  . Cancer antigen 27.29    Standing Status:   Future    Number of Occurrences:   1    Standing Expiration Date:   02/25/2019  . Cancer antigen 15-3    Standing Status:   Future    Number of Occurrences:   1    Standing Expiration Date:   02/25/2019       Zoila Shutter MD

## 2018-02-24 NOTE — Progress Notes (Signed)
Pt presents for port flush with labs. Lab work drawn and port flushed with saline and heparin per protocol. Deaccessed. Pt discharged in satisfactory condition in presence of husband.

## 2018-02-24 NOTE — Patient Instructions (Signed)
[facility] _______________________________________________________________  Thank you for choosing [facility] at [facility] to provide your oncology and hematology care.  To afford each patient quality time with our providers, please arrive at least 15 minutes before your scheduled appointment.  You need to re-schedule your appointment if you arrive 10 or more minutes late.  We strive to give you quality time with our providers, and arriving late affects you and other patients whose appointments are after yours.  Also, if you no show three or more times for appointments you may be dismissed from the clinic.  Again, thank you for choosing [facility] at [facility]. Our hope is that these requests will allow you access to exceptional care and in a timely manner. _______________________________________________________________  If you have questions after your visit, please contact our office at [phone] between the hours of 8:30 a.m. and 5:00 p.m. Voicemails left after 4:30 p.m. will not be returned until the following business day. _______________________________________________________________  For prescription refill requests, have your pharmacy contact our office. _______________________________________________________________  Recommendations made by the consultant and any test results will be sent to your referring physician. _______________________________________________________________ 

## 2018-02-25 LAB — CANCER ANTIGEN 15-3: CA 15-3: 102 U/mL — ABNORMAL HIGH (ref 0.0–25.0)

## 2018-02-25 LAB — CANCER ANTIGEN 27.29: CA 27.29: 110.8 U/mL — ABNORMAL HIGH (ref 0.0–38.6)

## 2018-03-02 ENCOUNTER — Encounter (HOSPITAL_COMMUNITY)
Admission: RE | Admit: 2018-03-02 | Discharge: 2018-03-02 | Disposition: A | Discharge status: Home / Self Care | Payer: Medicare Other | Source: Ambulatory Visit | Attending: Internal Medicine | Admitting: Internal Medicine

## 2018-03-02 ENCOUNTER — Encounter (HOSPITAL_COMMUNITY): Payer: Self-pay

## 2018-03-02 DIAGNOSIS — C7951 Secondary malignant neoplasm of bone: Secondary | ICD-10-CM | POA: Diagnosis not present

## 2018-03-02 DIAGNOSIS — R937 Abnormal findings on diagnostic imaging of other parts of musculoskeletal system: Secondary | ICD-10-CM | POA: Insufficient documentation

## 2018-03-02 DIAGNOSIS — Z853 Personal history of malignant neoplasm of breast: Secondary | ICD-10-CM | POA: Diagnosis not present

## 2018-03-02 MED ORDER — TECHNETIUM TC 99M MEDRONATE IV KIT
20.0000 | PACK | Freq: Once | INTRAVENOUS | Status: AC | PRN
  Administered 2018-03-02: 21 via INTRAVENOUS

## 2018-03-03 ENCOUNTER — Other Ambulatory Visit (HOSPITAL_COMMUNITY): Payer: Self-pay | Admitting: Hematology

## 2018-03-03 ENCOUNTER — Ambulatory Visit (HOSPITAL_COMMUNITY): Payer: Medicare Other

## 2018-03-03 DIAGNOSIS — C50919 Malignant neoplasm of unspecified site of unspecified female breast: Secondary | ICD-10-CM

## 2018-03-03 DIAGNOSIS — C7951 Secondary malignant neoplasm of bone: Secondary | ICD-10-CM

## 2018-03-03 DIAGNOSIS — R5382 Chronic fatigue, unspecified: Secondary | ICD-10-CM

## 2018-03-03 DIAGNOSIS — Z0000 Encounter for general adult medical examination without abnormal findings: Secondary | ICD-10-CM

## 2018-03-03 DIAGNOSIS — C229 Malignant neoplasm of liver, not specified as primary or secondary: Secondary | ICD-10-CM

## 2018-03-03 MED ORDER — LIDOCAINE-PRILOCAINE 2.5-2.5 % EX CREA: TOPICAL_CREAM | CUTANEOUS | 3 refills | Status: DC

## 2018-03-04 ENCOUNTER — Ambulatory Visit (HOSPITAL_COMMUNITY): Payer: Self-pay | Admitting: Internal Medicine

## 2018-03-04 ENCOUNTER — Other Ambulatory Visit (HOSPITAL_COMMUNITY): Payer: Self-pay | Admitting: Internal Medicine

## 2018-03-04 ENCOUNTER — Inpatient Hospital Stay (HOSPITAL_COMMUNITY): Payer: Medicare Other

## 2018-03-04 ENCOUNTER — Encounter (HOSPITAL_COMMUNITY): Payer: Self-pay

## 2018-03-04 VITALS — BP 125/52 | HR 67 | Temp 97.9°F | Resp 18 | Wt 268.3 lb

## 2018-03-04 DIAGNOSIS — C7931 Secondary malignant neoplasm of brain: Secondary | ICD-10-CM | POA: Diagnosis not present

## 2018-03-04 DIAGNOSIS — C787 Secondary malignant neoplasm of liver and intrahepatic bile duct: Secondary | ICD-10-CM | POA: Diagnosis not present

## 2018-03-04 DIAGNOSIS — C7951 Secondary malignant neoplasm of bone: Secondary | ICD-10-CM | POA: Diagnosis not present

## 2018-03-04 DIAGNOSIS — Z5112 Encounter for antineoplastic immunotherapy: Secondary | ICD-10-CM | POA: Diagnosis not present

## 2018-03-04 DIAGNOSIS — Z23 Encounter for immunization: Secondary | ICD-10-CM | POA: Diagnosis not present

## 2018-03-04 DIAGNOSIS — C50919 Malignant neoplasm of unspecified site of unspecified female breast: Secondary | ICD-10-CM

## 2018-03-04 MED ORDER — HEPARIN SOD (PORK) LOCK FLUSH 100 UNIT/ML IV SOLN
500.0000 [IU] | Freq: Once | INTRAVENOUS | Status: AC | PRN
  Administered 2018-03-04: 500 [IU]

## 2018-03-04 MED ORDER — DIPHENHYDRAMINE HCL 25 MG PO CAPS
50.0000 mg | ORAL_CAPSULE | Freq: Once | ORAL | Status: AC
  Administered 2018-03-04: 50 mg via ORAL

## 2018-03-04 MED ORDER — SODIUM CHLORIDE 0.9 % IV SOLN
Freq: Once | INTRAVENOUS | Status: AC
  Administered 2018-03-04: 12:00:00 via INTRAVENOUS

## 2018-03-04 MED ORDER — ZOLEDRONIC ACID 4 MG/100ML IV SOLN
4.0000 mg | Freq: Once | INTRAVENOUS | Status: AC
  Administered 2018-03-04: 4 mg via INTRAVENOUS
  Filled 2018-03-04: qty 100

## 2018-03-04 MED ORDER — SODIUM CHLORIDE 0.9 % IV SOLN
3.4000 mg/kg | Freq: Once | INTRAVENOUS | Status: AC
  Administered 2018-03-04: 420 mg via INTRAVENOUS
  Filled 2018-03-04: qty 16

## 2018-03-04 MED ORDER — SODIUM CHLORIDE 0.9% FLUSH
10.0000 mL | INTRAVENOUS | Status: DC | PRN
  Administered 2018-03-04: 10 mL
  Filled 2018-03-04: qty 10

## 2018-03-04 MED ORDER — ACETAMINOPHEN 325 MG PO TABS
650.0000 mg | ORAL_TABLET | Freq: Once | ORAL | Status: AC
  Administered 2018-03-04: 650 mg via ORAL

## 2018-03-04 MED ORDER — ACETAMINOPHEN 325 MG PO TABS
ORAL_TABLET | ORAL | Status: AC
  Filled 2018-03-04: qty 2

## 2018-03-04 MED ORDER — DIPHENHYDRAMINE HCL 25 MG PO CAPS
ORAL_CAPSULE | ORAL | Status: AC
  Filled 2018-03-04: qty 2

## 2018-03-04 NOTE — Patient Instructions (Signed)
[facility] Discharge Instructions for Patients Receiving Chemotherapy  Today you received the following chemotherapy agents kadcyla.    If you develop nausea and vomiting that is not controlled by your nausea medication, call the clinic.   BELOW ARE SYMPTOMS THAT SHOULD BE REPORTED IMMEDIATELY:  *FEVER GREATER THAN 100.5 F  *CHILLS WITH OR WITHOUT FEVER  NAUSEA AND VOMITING THAT IS NOT CONTROLLED WITH YOUR NAUSEA MEDICATION  *UNUSUAL SHORTNESS OF BREATH  *UNUSUAL BRUISING OR BLEEDING  TENDERNESS IN MOUTH AND THROAT WITH OR WITHOUT PRESENCE OF ULCERS  *URINARY PROBLEMS  *BOWEL PROBLEMS  UNUSUAL RASH Items with * indicate a potential emergency and should be followed up as soon as possible.  Feel free to call the clinic should you have any questions or concerns. The clinic phone number is [phone].  Please show the CHEMO ALERT CARD at check-in to the Emergency Department and triage nurse.   

## 2018-03-04 NOTE — Telephone Encounter (Signed)
Spoke with pt and husband in infusion.  Recent imaging with PET scan and bone scans appears consistent with progression with new T11 lesion and increase In size of T10 and right scapula lesion.    Tumor markers have increased from CA 15-3 of 70 and 27.29 of 80 in 11/2017 to CA 15-3 of 102 and CA 27.29 of 110 on labs done 02/24/2018.    Pt will be set up for MRI of thoracic and lumbar spine for further evaluation of low back pain and T10 and T 11 lesions.     For patients who progress after initial herceptin and a taxane in the metastatic setting, or after both trastuzumab- and lapatinib-containing regimens, T-DM1 is an active agent, provided they have not received it previously.  Review of chart shows no piror treatment and pt denies previous therapy with Kadcyla.    Recommendation was based on results of EMILIA trial and a second phase III trial, called TH3RESA.  Pts on this trial noted improvement in PFS and OS.    Treatment with T-DM1 was not associated with an increased incidence of serious (grade 3/4) adverse events. Based on these data, T-DM1 should be administered in patients who progress on prior HER2-directed therapies. Retrospective data suggest activity of T-DM1 among those treated with prior Pertuzumab suggesting a clinical benefit even in the setting of prior pertuzumab use.  Side effects of the medication were reviewed with the pt and she was provided written information.  She is advised to discontinue Lapatinib.  Pt will also have option of being referred to [facility] center for clinical trial consideration.  All questions answered and she expressed understanding of the information presented. She will also receive Zometa for bone strengthening every 3 weeks with Kadcyla. which will be dosed at 3.6 mg/kg every 3 weeks.  All questions answered and pt expressed understanding of the information presented.  Pt will be seen for follow-up in 3 weeks,

## 2018-03-04 NOTE — Progress Notes (Signed)
Patient seen by Dr. Walden Field today.  New chemotherapy Kadcyla reviewed with the patient by Dr. Walden Field with all questions asked and answsered.  Consent signed.  Patient will stop the Tykerb.  She will receive Zometa every 3 weeks verbal order Dr. Walden Field with Kadcyla.  Pharmacy notified.   Ok to use lab work from February 24, 2018, for treatment today.  Patient will continue potassium tablet daily verbal order Dr. Walden Field.  Patient and family verbalized understanding.     Patient tolerated treatment with no complaints voiced.  Port site clean and dry with no bruising or swelling noted at site.  No blood return noted but patient denied pain with flushes.  Band aid applied.  VSs with discharge and left ambulatory with family with no s/s of distress noted.

## 2018-03-05 ENCOUNTER — Other Ambulatory Visit (HOSPITAL_COMMUNITY): Payer: Self-pay | Admitting: Internal Medicine

## 2018-03-05 DIAGNOSIS — C50919 Malignant neoplasm of unspecified site of unspecified female breast: Secondary | ICD-10-CM

## 2018-03-05 DIAGNOSIS — R5382 Chronic fatigue, unspecified: Secondary | ICD-10-CM

## 2018-03-05 DIAGNOSIS — Z0000 Encounter for general adult medical examination without abnormal findings: Secondary | ICD-10-CM

## 2018-03-05 DIAGNOSIS — C229 Malignant neoplasm of liver, not specified as primary or secondary: Secondary | ICD-10-CM

## 2018-03-05 DIAGNOSIS — C7951 Secondary malignant neoplasm of bone: Secondary | ICD-10-CM

## 2018-03-07 ENCOUNTER — Other Ambulatory Visit (HOSPITAL_COMMUNITY): Payer: Self-pay | Admitting: *Deleted

## 2018-03-07 ENCOUNTER — Other Ambulatory Visit (HOSPITAL_COMMUNITY): Payer: Self-pay | Admitting: Internal Medicine

## 2018-03-07 DIAGNOSIS — C229 Malignant neoplasm of liver, not specified as primary or secondary: Secondary | ICD-10-CM

## 2018-03-07 DIAGNOSIS — Z0000 Encounter for general adult medical examination without abnormal findings: Secondary | ICD-10-CM

## 2018-03-07 DIAGNOSIS — R5382 Chronic fatigue, unspecified: Secondary | ICD-10-CM

## 2018-03-07 DIAGNOSIS — C7951 Secondary malignant neoplasm of bone: Secondary | ICD-10-CM

## 2018-03-07 DIAGNOSIS — C50919 Malignant neoplasm of unspecified site of unspecified female breast: Secondary | ICD-10-CM

## 2018-03-07 MED ORDER — MORPHINE SULFATE ER 30 MG PO TBCR: EXTENDED_RELEASE_TABLET | ORAL | 0 refills | Status: DC

## 2018-03-07 NOTE — Telephone Encounter (Signed)
Already sent Rx.  Please verify

## 2018-03-08 ENCOUNTER — Other Ambulatory Visit (HOSPITAL_COMMUNITY): Payer: Self-pay | Admitting: *Deleted

## 2018-03-08 DIAGNOSIS — K219 Gastro-esophageal reflux disease without esophagitis: Secondary | ICD-10-CM

## 2018-03-08 MED ORDER — PANTOPRAZOLE SODIUM 40 MG PO TBEC: 40.0000 mg | DELAYED_RELEASE_TABLET | Freq: Two times a day (BID) | ORAL | 3 refills | Status: AC

## 2018-03-10 DIAGNOSIS — H43813 Vitreous degeneration, bilateral: Secondary | ICD-10-CM | POA: Diagnosis not present

## 2018-03-10 DIAGNOSIS — H35352 Cystoid macular degeneration, left eye: Secondary | ICD-10-CM | POA: Diagnosis not present

## 2018-03-10 DIAGNOSIS — H43822 Vitreomacular adhesion, left eye: Secondary | ICD-10-CM | POA: Diagnosis not present

## 2018-03-10 DIAGNOSIS — H34831 Tributary (branch) retinal vein occlusion, right eye, with macular edema: Secondary | ICD-10-CM | POA: Diagnosis not present

## 2018-03-11 ENCOUNTER — Telehealth (HOSPITAL_COMMUNITY): Payer: Self-pay | Admitting: Internal Medicine

## 2018-03-11 ENCOUNTER — Ambulatory Visit (HOSPITAL_COMMUNITY)
Admission: RE | Admit: 2018-03-11 | Discharge: 2018-03-11 | Disposition: A | Discharge status: Home / Self Care | Payer: Medicare Other | Source: Ambulatory Visit | Attending: Internal Medicine | Admitting: Internal Medicine

## 2018-03-11 ENCOUNTER — Encounter (HOSPITAL_COMMUNITY): Payer: Self-pay | Admitting: Lab

## 2018-03-11 DIAGNOSIS — C7951 Secondary malignant neoplasm of bone: Secondary | ICD-10-CM | POA: Diagnosis not present

## 2018-03-11 DIAGNOSIS — C50919 Malignant neoplasm of unspecified site of unspecified female breast: Secondary | ICD-10-CM | POA: Insufficient documentation

## 2018-03-11 MED ORDER — GADOBUTROL 1 MMOL/ML IV SOLN
10.0000 mL | Freq: Once | INTRAVENOUS | Status: AC | PRN
  Administered 2018-03-11: 10 mL via INTRAVENOUS

## 2018-03-11 NOTE — Progress Notes (Unsigned)
Referral sent to [facility] [place]. Records faxed on 11/1 

## 2018-03-11 NOTE — Telephone Encounter (Signed)
Pt notified of MRI results done 03/11/2018.  Pt reports back pain.  No cord compression noted on scan.   She will be referred for RT opinion due to symptoms and spine lesion noted on MRI.

## 2018-03-14 ENCOUNTER — Ambulatory Visit (HOSPITAL_COMMUNITY): Payer: Self-pay

## 2018-03-23 ENCOUNTER — Other Ambulatory Visit (HOSPITAL_COMMUNITY): Payer: Self-pay | Admitting: Internal Medicine

## 2018-03-23 ENCOUNTER — Other Ambulatory Visit (HOSPITAL_COMMUNITY): Payer: Self-pay | Admitting: Hematology

## 2018-03-23 DIAGNOSIS — G4700 Insomnia, unspecified: Secondary | ICD-10-CM

## 2018-03-23 DIAGNOSIS — C50919 Malignant neoplasm of unspecified site of unspecified female breast: Secondary | ICD-10-CM

## 2018-03-23 DIAGNOSIS — C7951 Secondary malignant neoplasm of bone: Secondary | ICD-10-CM

## 2018-03-23 DIAGNOSIS — C229 Malignant neoplasm of liver, not specified as primary or secondary: Secondary | ICD-10-CM

## 2018-03-24 DIAGNOSIS — C50919 Malignant neoplasm of unspecified site of unspecified female breast: Secondary | ICD-10-CM | POA: Diagnosis not present

## 2018-03-24 DIAGNOSIS — Z510 Encounter for antineoplastic radiation therapy: Secondary | ICD-10-CM | POA: Diagnosis not present

## 2018-03-24 DIAGNOSIS — C50911 Malignant neoplasm of unspecified site of right female breast: Secondary | ICD-10-CM | POA: Diagnosis not present

## 2018-03-24 DIAGNOSIS — C7951 Secondary malignant neoplasm of bone: Secondary | ICD-10-CM | POA: Diagnosis not present

## 2018-03-24 DIAGNOSIS — C7931 Secondary malignant neoplasm of brain: Secondary | ICD-10-CM | POA: Diagnosis not present

## 2018-03-25 ENCOUNTER — Inpatient Hospital Stay (HOSPITAL_COMMUNITY): Payer: Medicare Other | Attending: Adult Health

## 2018-03-25 ENCOUNTER — Other Ambulatory Visit: Payer: Self-pay

## 2018-03-25 ENCOUNTER — Inpatient Hospital Stay (HOSPITAL_BASED_OUTPATIENT_CLINIC_OR_DEPARTMENT_OTHER): Payer: Medicare Other | Admitting: Internal Medicine

## 2018-03-25 ENCOUNTER — Inpatient Hospital Stay (HOSPITAL_COMMUNITY): Payer: Medicare Other

## 2018-03-25 ENCOUNTER — Ambulatory Visit (HOSPITAL_COMMUNITY): Payer: Self-pay | Admitting: Internal Medicine

## 2018-03-25 ENCOUNTER — Encounter (HOSPITAL_COMMUNITY): Payer: Self-pay | Admitting: Internal Medicine

## 2018-03-25 VITALS — BP 125/61 | HR 66 | Temp 98.0°F | Resp 18

## 2018-03-25 VITALS — BP 99/76 | HR 96 | Temp 98.3°F | Resp 20 | Wt 270.2 lb

## 2018-03-25 DIAGNOSIS — Z87891 Personal history of nicotine dependence: Secondary | ICD-10-CM | POA: Diagnosis not present

## 2018-03-25 DIAGNOSIS — C7951 Secondary malignant neoplasm of bone: Secondary | ICD-10-CM | POA: Insufficient documentation

## 2018-03-25 DIAGNOSIS — C50919 Malignant neoplasm of unspecified site of unspecified female breast: Secondary | ICD-10-CM

## 2018-03-25 DIAGNOSIS — R109 Unspecified abdominal pain: Secondary | ICD-10-CM

## 2018-03-25 DIAGNOSIS — K219 Gastro-esophageal reflux disease without esophagitis: Secondary | ICD-10-CM | POA: Diagnosis not present

## 2018-03-25 DIAGNOSIS — Z86718 Personal history of other venous thrombosis and embolism: Secondary | ICD-10-CM | POA: Diagnosis not present

## 2018-03-25 DIAGNOSIS — F329 Major depressive disorder, single episode, unspecified: Secondary | ICD-10-CM

## 2018-03-25 DIAGNOSIS — Z923 Personal history of irradiation: Secondary | ICD-10-CM | POA: Diagnosis not present

## 2018-03-25 DIAGNOSIS — Z9221 Personal history of antineoplastic chemotherapy: Secondary | ICD-10-CM | POA: Diagnosis not present

## 2018-03-25 DIAGNOSIS — Z90722 Acquired absence of ovaries, bilateral: Secondary | ICD-10-CM | POA: Diagnosis not present

## 2018-03-25 DIAGNOSIS — E876 Hypokalemia: Secondary | ICD-10-CM | POA: Insufficient documentation

## 2018-03-25 DIAGNOSIS — C7931 Secondary malignant neoplasm of brain: Secondary | ICD-10-CM

## 2018-03-25 DIAGNOSIS — E785 Hyperlipidemia, unspecified: Secondary | ICD-10-CM

## 2018-03-25 DIAGNOSIS — Z5112 Encounter for antineoplastic immunotherapy: Secondary | ICD-10-CM

## 2018-03-25 LAB — COMPREHENSIVE METABOLIC PANEL
ALBUMIN: 3.5 g/dL (ref 3.5–5.0)
ALK PHOS: 107 U/L (ref 38–126)
ALT: 21 U/L (ref 0–44)
ANION GAP: 10 (ref 5–15)
AST: 31 U/L (ref 15–41)
BILIRUBIN TOTAL: 0.3 mg/dL (ref 0.3–1.2)
BUN: 11 mg/dL (ref 6–20)
CALCIUM: 9.2 mg/dL (ref 8.9–10.3)
CO2: 30 mmol/L (ref 22–32)
Chloride: 100 mmol/L (ref 98–111)
Creatinine, Ser: 0.92 mg/dL (ref 0.44–1.00)
GLUCOSE: 134 mg/dL — AB (ref 70–99)
POTASSIUM: 4 mmol/L (ref 3.5–5.1)
Sodium: 140 mmol/L (ref 135–145)
TOTAL PROTEIN: 7.3 g/dL (ref 6.5–8.1)

## 2018-03-25 LAB — CBC WITH DIFFERENTIAL/PLATELET
Abs Immature Granulocytes: 0.05 10*3/uL (ref 0.00–0.07)
Basophils Absolute: 0 10*3/uL (ref 0.0–0.1)
Basophils Relative: 0 %
EOS ABS: 0 10*3/uL (ref 0.0–0.5)
EOS PCT: 1 %
HEMATOCRIT: 35.4 % — AB (ref 36.0–46.0)
Hemoglobin: 10.1 g/dL — ABNORMAL LOW (ref 12.0–15.0)
Immature Granulocytes: 1 %
LYMPHS ABS: 1.1 10*3/uL (ref 0.7–4.0)
Lymphocytes Relative: 20 %
MCH: 23.7 pg — AB (ref 26.0–34.0)
MCHC: 28.5 g/dL — AB (ref 30.0–36.0)
MCV: 82.9 fL (ref 80.0–100.0)
MONO ABS: 0.4 10*3/uL (ref 0.1–1.0)
Monocytes Relative: 8 %
NRBC: 0 % (ref 0.0–0.2)
Neutro Abs: 4.1 10*3/uL (ref 1.7–7.7)
Neutrophils Relative %: 70 %
Platelets: 320 10*3/uL (ref 150–400)
RBC: 4.27 MIL/uL (ref 3.87–5.11)
RDW: 17.5 % — AB (ref 11.5–15.5)
WBC: 5.8 10*3/uL (ref 4.0–10.5)

## 2018-03-25 LAB — LACTATE DEHYDROGENASE: LDH: 227 U/L — AB (ref 98–192)

## 2018-03-25 MED ORDER — DIPHENHYDRAMINE HCL 25 MG PO CAPS
ORAL_CAPSULE | ORAL | Status: AC
  Filled 2018-03-25: qty 1

## 2018-03-25 MED ORDER — HEPARIN SOD (PORK) LOCK FLUSH 100 UNIT/ML IV SOLN
500.0000 [IU] | Freq: Once | INTRAVENOUS | Status: AC | PRN
  Administered 2018-03-25: 500 [IU]

## 2018-03-25 MED ORDER — SODIUM CHLORIDE 0.9 % IV SOLN
3.4000 mg/kg | Freq: Once | INTRAVENOUS | Status: AC
  Administered 2018-03-25: 420 mg via INTRAVENOUS
  Filled 2018-03-25: qty 16

## 2018-03-25 MED ORDER — ACETAMINOPHEN 325 MG PO TABS
650.0000 mg | ORAL_TABLET | Freq: Once | ORAL | Status: AC
  Administered 2018-03-25: 650 mg via ORAL
  Filled 2018-03-25: qty 2

## 2018-03-25 MED ORDER — SODIUM CHLORIDE 0.9% FLUSH
10.0000 mL | INTRAVENOUS | Status: DC | PRN
  Administered 2018-03-25: 10 mL
  Filled 2018-03-25: qty 10

## 2018-03-25 MED ORDER — ZOLEDRONIC ACID 4 MG/100ML IV SOLN
4.0000 mg | Freq: Once | INTRAVENOUS | Status: AC
  Administered 2018-03-25: 4 mg via INTRAVENOUS
  Filled 2018-03-25: qty 100

## 2018-03-25 MED ORDER — SODIUM CHLORIDE 0.9 % IV SOLN
Freq: Once | INTRAVENOUS | Status: AC
  Administered 2018-03-25: 14:00:00 via INTRAVENOUS

## 2018-03-25 MED ORDER — DIPHENHYDRAMINE HCL 25 MG PO CAPS
50.0000 mg | ORAL_CAPSULE | Freq: Once | ORAL | Status: AC
  Administered 2018-03-25: 50 mg via ORAL
  Filled 2018-03-25: qty 2

## 2018-03-25 NOTE — Progress Notes (Signed)
Camyah H Giannattasio tolerated Kadcyla and Zometa infusions well without complaints or incident. Labs reviewed with and pt seen by Dr. Walden Field prior to administering these medications. Calcium 9.2 today and pt denies any tooth or jaw pain and no recent or future dental visits prior to administering Zometa. Pt continues to take her Calcium PO as prescribed. VSS upon discharge. Pt discharged self ambulatory in satisfactory condition accompanied by her husband

## 2018-03-25 NOTE — Patient Instructions (Signed)
[facility] Discharge Instructions for Patients Receiving Chemotherapy   Beginning January 23rd 2017 lab work for the [facility] will be done in the  Main lab at [facility] on 1st floor. If you have a lab appointment with the [place] please come in thru the  Main Entrance and check in at the main information desk   Today you received the following chemotherapy agents Kadcyla as wellas Zometa infusion. Follow-up as scheduled. Call clinic for any questions or concerns  To help prevent nausea and vomiting after your treatment, we encourage you to take your nausea medication   If you develop nausea and vomiting, or diarrhea that is not controlled by your medication, call the clinic.  The clinic phone number is [phone]. Office hours are Monday-Friday 8:30am-5:00pm.  BELOW ARE SYMPTOMS THAT SHOULD BE REPORTED IMMEDIATELY:  *FEVER GREATER THAN 101.0 F  *CHILLS WITH OR WITHOUT FEVER  NAUSEA AND VOMITING THAT IS NOT CONTROLLED WITH YOUR NAUSEA MEDICATION  *UNUSUAL SHORTNESS OF BREATH  *UNUSUAL BRUISING OR BLEEDING  TENDERNESS IN MOUTH AND THROAT WITH OR WITHOUT PRESENCE OF ULCERS  *URINARY PROBLEMS  *BOWEL PROBLEMS  UNUSUAL RASH Items with * indicate a potential emergency and should be followed up as soon as possible. If you have an emergency after office hours please contact your primary care physician or go to the nearest emergency department.  Please call the clinic during office hours if you have any questions or concerns.   You may also contact the Patient Navigator at [phone] should you have any questions or need assistance in obtaining follow up care.      Resources For Cancer Patients and their Caregivers ? American Cancer Society: Can assist with transportation, wigs, general needs, runs Look Good Feel Better.        (813)626-0518 ? Cancer Care: Provides financial assistance, online support groups, medication/co-pay assistance.   1-800-813-HOPE 580 281 8485) ? [facility] Assists [place] cancer patients and their families through emotional , educational and financial support.  463-544-4335 ? [place] DSS Where to apply for food stamps, Medicaid and utility assistance. (502)525-8189 ? [employer]: Transportation to medical appointments. 8598282592 ? Social Security Administration: May apply for disability if have a Stage IV cancer. 971-536-1478 220-414-6438 ? [employer], Disability and Transit Services: Assists with nutrition, care and transit needs. [phone]

## 2018-03-25 NOTE — Progress Notes (Signed)
Diagnosis Malignant neoplasm of breast, stage 4, unspecified laterality ([place]) - Plan: CBC with Differential/Platelet, Comprehensive metabolic panel, Lactate dehydrogenase  Staging Cancer Staging Breast cancer, stage 4 (HCC) Staging form: Breast, AJCC 7th Edition - Clinical stage from 05/27/2014: Stage IV (T0, N0, M1) - Signed by Baird Cancer, PA-C on 05/27/2014   Assessment and Plan:  1.  52 yr old female with Stage IV adenocarcinoma of breast, ER+, HER2 + with metastatic disease to brain and bone. She is S/P multiple lines of therapy. Primary tumor never identified. She is S/P BSO by Dr. Gaetano Net on 08/27/2015. Pt was treated with Herceptin + Tykerb beginning on 07/23/2017 and she continues on bone targeted therapy with Zometa  to reduce SRE and its antitumor effect in bone. Restaging PET scan on 10/18/2017 demonstrated diffuse sclerotic bone metastases with mild improvement noted at several sites without any new or progressive metastatic disease or evidence of soft tissue metastatic disease. Restaging MRI brain on 10/27/2017 was negative for intracranial disease.  ECHO done 01/17/2018 shows EF 60-65%.      PET scan done 02/21/2018 reviewed and showed   IMPRESSION: 1. Multifocal hypermetabolic sclerotic bone metastases are identified. Some of these lesions appear increased in size in the interval including the right scapula and T10 vertebra. New abnormal uptake is identified within the T11 vertebra. Other lesions exhibit similar degrees of hypermetabolism. 2. No hypermetabolic nodal or solid organ metastases  MRI of brain done 02/23/2018 reviewed and showed  IMPRESSION: Stable exam. Mild atrophy and small vessel disease. No acute intracranial abnormality.  Satisfactory post treatment changes. No new or enhancing lesions are Identified.  Recent imaging with PET scan and bone scans appears consistent with progression with new T11 lesion and increase in size of T10 and right scapula  lesion.    Bone scan done 03/02/2018 reviewed and showed  FINDINGS: Multiple sites of abnormal osseous tracer accumulation consistent with osseous metastatic disease, including RIGHT glenoid, RIGHT humeral head, anterior RIGHT sixth rib, BILATERAL posterior ribs, T7 and T10 vertebral bodies, sacrum, anterior LEFT iliac bone, RIGHT SI joint, and RIGHT femoral head/neck junction.  Tumor markers have increased from CA 15-3 of 70 and 27.29 of 80 in 11/2017 to CA 15-3 of 102 and CA 27.29 of 110 on labs done 02/24/2018.    Pt underwent MRI of thoracic and lumbar spine for further evaluation of low back pain and T10 and T 11 lesions.   MRI done 03/11/2018 reviewed and showed  IMPRESSION: 1. Known multifocal osseous metastatic disease most notable at T10 where there is left preferential epidural tumor extension causing left foraminal impingement at T10-11. No cord mass effect. 2. No pathologic fracture.  For patients who progress after initial herceptin and a taxane in the metastatic setting, or after both trastuzumab- and lapatinib-containing regimens, T-DM1 is an active agent, provided they have not received it previously.  Review of chart shows no piror treatment and pt denies previous therapy with Kadcyla.    Recommendation was based on results of EMILIA trial and a second phase III trial, called [id number].  Pts on this trial noted improvement in PFS and OS.    Treatment with T-DM1 was not associated with an increased incidence of serious (grade 3/4) adverse events. Based on these data, T-DM1 should be administered in patients who progress on prior HER2-directed therapies. Retrospective data suggest activity of T-DM1 among those treated with prior Pertuzumab suggesting a clinical benefit even in the setting of prior pertuzumab use.  Side effects of  the medication previously reviewed with the pt and she was provided written information.  She is advised to discontinue Lapatinib.  Pt was also  given option of referral  to [facility] center for clinical trial consideration.  All questions answered and she expressed understanding of the information presented. She will also receive Zometa for bone strengthening every 3 weeks with Kadcyla. which will be dosed at 3.6 mg/kg every 3 weeks.  All questions answered and pt expressed understanding of the information presented.  Pt will be seen for follow-up in 3 weeks.  Will monitor Heart function every 3 months.  LFTS done 03/25/2018 WNL with AST of 31 and ALT of 21 and normal Bilirubin of 0.3.    PET scan reports a new area at T11.  No cord compression reported.  She also has reported increase in lesion in right scapula and T10 vertebra.  Other lesions are stable and there is no evidence of solid organ mets.    Pt was referred to [facility] for evaluation in [place] as she has been seen there before.   I have spoken with their office and pt will begin treatment this week with anticipated completion prior to Thanksgiving.    Pt will be seen for follow-up in 3 weeks prior to C3.  Anticipate 3 months of therapy with repeat imaging.    Pt has low back pain but is ambulatory.    Pt has known problems with left arm and is scheduled for surgery in 04/12/2018.    2.  Headache/Brain Mets.  Pt reports this is improved.   MRI of brain done 10/2017 was negative.  She reports history of migraine  Follow-up with PCP or neurology for management.  She is on morphine.  MRI of brain for ongoing follow-up done 02/23/2018 shows IMPRESSION: Stable exam. Mild atrophy and small vessel disease. No acute intracranial abnormality.  Satisfactory post treatment changes. No new or enhancing lesions are Identified  Pt will have repeat imaging in 3 months.    3.  Bone mets.  Zometa every 3-4 weeks.   4.  Hypokalemia.  Labs done 03/25/2018 reviewed and showed WBC 5.8 Hb 10.1 plts 320,000.  Chemistries WNL with K+ 4 Cr 0.92 and normal LFTs.    5  Mild abdominal pain.  She reports this  is not new.  Tums prn.  Recent imaging showed adenopathy in abdomen.  Will continue to monitor to determine if symptoms improve with RT.    25  minutes spent with more than 50% spent in counseling and coordination of care.    Interval History:  Historical data obtained from the note dated 10/29/2017.  52 yr old female with Stage IV adenocarcinoma of breast, ER+, HER2 + with metastatic disease to brain and bone. She is S/P multiple lines of therapy. Primary tumor never identified. She is S/P BSO by Dr. Gaetano Net on 08/27/2015. Current therapy is Herceptin + Tykerb beginning on 07/23/2017 and she continues on bone targeted therapy with Zometa every 3 months to reduce SRE and its antitumor effect in bone. Restaging PET scan on 10/18/2017 demonstrated diffuse sclerotic bone metastases with mild improvement noted at several sites without any new or progressive metastatic disease or evidence of soft tissue metastatic disease. Restaging MRI brain on 10/27/2017 was negative for intracranial disease.  Current Status:  Pt is seen today for follow-up prior to C2 of Kadcyla.  Pt is scheduled for surgery on 04/12/2018.  She has been seen by [facility] and is scheduled for planning.  Breast cancer, stage 4 ([place])   04/25/2014 Initial Diagnosis    Breast cancer, stage 4    04/25/2014 Imaging    CT abdomen/pelvis with widespread metastatic disease to the liver, multiple lytic lesions throughout spine and pelvis. No FX or epidural tumor identified    04/26/2014 Imaging    CT head unremarkable    04/26/2014 Imaging    CT chest with no lung mass or pulmonary nodules, no adenopathy. Lytic bone lesions, right 2nd rib    04/27/2014 Initial Biopsy    U/S guided liver biopsy, lesion in anterior and inferior left hepatic lobe biopsied    04/27/2014 Pathology Results    Metastatic adenocarcinoma, CK7, ER+, patchy positivity with PR. Possible primary includes breast, less likely gynecologic    05/15/2014 Mammogram    BI-RADS  CATEGORY  2: Benign Finding(s)    05/16/2014 PET scan    1. Intensely hypermetabolic hepatic metastasis. 2. Widespread hypermetabolic skeletal lesions. 3. No primary adenocarcinoma identified by FDG PET imaging.    05/19/2014 Imaging    MUGA- Left ventricular ejection fracture greater than 70%.    05/21/2014 Breast MRI    No suspicious masses or enhancement within the breasts. No axillary adenopathy.    05/22/2014 - 07/03/2014 Antibody Plan    Herceptin/Perjeta/Tamoxifen    06/12/2014 - 07/03/2014 Chemotherapy    Taxotere added secondary to persistent abdominal and back pain    06/17/2014 - 06/19/2014 Hospital Admission    Neutropenia, fever, diarrhea, nausea, vomiting    06/20/2014 - 07/10/2014 Radiation Therapy    Dr. Thea Silversmith 12 fractions to L3-S3 (30 Gy) and left scapula (20 Gy).     07/03/2014 Adverse Reaction    Perjeta- induced diarrhea.  Perjeta discontinued    07/16/2014 - 07/20/2014 Hospital Admission    Electrolyte abnormalities, and diarrhea.  Suspect Perjeta-induced diarrhea.  Negative GI work-up.    07/24/2014 - 08/19/2015 Chemotherapy    Herceptin/Tamoxifen/Xgeva    08/21/2014 Imaging    MUGA- Left ventricular ejection fraction equals 71%.    08/24/2014 PET scan    Dramatic reduction in metabolic activity of the widespread liver metastasis. Liver metastasis now have metabolic activity equal to background normal liver activity. Liver has a nodular contour. Marked reduction in metabolic activity of skeletal lesions..    10/05/2014 Progression    Widespread metastatic disease to the brain as described. Between 20 and 30 intracranial metastatic deposits are now seen. No midline shift or incipient herniation    10/09/2014 - 10/26/2014 Radiation Therapy    Whole Brain XRT    11/14/2014 Imaging    MUGA- LVEF 67%    02/13/2015 Imaging    MUGA- LVEF 59%    02/15/2015 Treatment Plan Change    Due to declining LVEF, will hold Herceptin per PI guidelines.    04/12/2015 -  Chemotherapy     Herceptin restarted    06/02/2015 - 06/08/2015 Hospital Admission    Pneumonia    07/05/2015 Progression     PET/CT concern for mild progression of skeletal metastasis with several lesions within the spine and 1 lesion in the Left iliac wing with mild to moderate metabolic activity new from prior. Rising CA 27-29    07/16/2015 - 10/23/2015 Anti-estrogen oral therapy    Arimidex    07/16/2015 Imaging    MRI brain with satisfactory post treatment apperance of brain. interval resolved enhancing R caudate metastasis, minimal punctate residual enhancing metastatic disease at the inferior L cerebellum. No new metastatic disease or new intracranial abnormality  07/19/2015 Treatment Plan Change    Discontinue Tamoxifen, Zoladex plus Arimidex.     08/27/2015 Procedure    Laparoscopic bilateral salpingo-oophorectomy by Dr. Gaetano Net    10/17/2015 PET scan    Osseous metastatic disease appears slightly progressive based on a new right scapular lesion and increased uptake within lesions in the thoracic spine, left iliac wing and  proximal right femur.    10/17/2015 Progression    Slight progression on PET scan imaging.    10/18/2015 Imaging    REsolved enhancing metastatic disease to the brain status post WBXRT    10/23/2015 - 02/12/2016 Adjuvant Chemotherapy    Faslodex loading followed by maintenance dose.  (Herceptin continued)    11/04/2015 Imaging    MUGA- LEFT ventricular ejection fraction 51% slightly decreased in a 57% on the previous exam.    12/24/2015 Treatment Plan Change    Zometa every 28 days.  Xgeva discontinued.      01/01/2016 Imaging    MUGA- Left ventricular ejection fraction equals 57.9%. This is increased from 51.1% previously.    02/03/2016 PET scan    1. Mixed metabolic changes in the scattered hypermetabolic sclerotic osseous metastases throughout the axial and proximal appendicular skeleton as detailed above. 2. No new sites of hypermetabolic metastatic disease.  Stable pseudo-cirrhotic appearance of the liver due to treated liver metastases with no hypermetabolic liver metastases.     02/03/2016 Progression    PET shows mixed osseous response.  Some lesions more hypermetabolic, others improved.    02/05/2016 Treatment Plan Change    D/C Herceptin.  Faslodex as scheduled on 02/12/2016, then discontinued.  Continue Zometa.    02/05/2016 Treatment Plan Change    Prescriptions for Xeloda 7 days on and 7 days off and Tykerb printed and provided for authorization.    02/19/2016 -  Chemotherapy    Xeloda 2300 mg BID 7 days on and 7 days off and Tykerb     02/24/2016 Treatment Plan Change    Xeloda dose reduced by 10% to 2000 mg BID week on and week off.    03/18/2016 Treatment Plan Change    Xeloda dose reduced to 2000 mg in AM and 1500 mg in PM 7 days on and 7 days off.    03/23/2016 Imaging    MUGA- Normal LEFT ventricular ejection fraction of 56% not significantly changed from 58% on previous exam.    05/19/2016 Imaging    MRI brain- . No new focus of abnormal enhancement to suggest interval metastatic disease. 2. No acute intracranial abnormality. 3. Stable foci of T2 FLAIR hyperintensity in white matter and in the right caudate head compatible with posttreatment changes and treated metastasis.    05/20/2016 PET scan    1. The multiple osseous metastatic lesions shown to be hypermetabolic on the prior exam are reduced in activity or even resolved in hypermetabolic activity compared to prior, as detailed above. There are also numerous sclerotic bony lesions wedge are not currently and were not previously hypermetabolic, compatible with old non active lesions. 2. No findings of extra osseous metastatic disease currently. 3. Hepatic pseudocirrhosis. 4. Healing rib fractures. Chronic pathologic fracture the left acromion.    07/08/2016 Imaging    MUGA- Low normal LEFT ventricular ejection fraction of 53%, little changed since the 56% on  03/23/2016 but slightly decreased from the 58% on 01/01/2016.    09/07/2016 Echocardiogram    MUGA- Normal LEFT ventricular ejection fraction of 59% with normal LV wall motion.    09/07/2016  Treatment Plan Change    Xeloda dose reduced to 1500 mg BID 7 days on and 7 days off.    09/15/2016 PET scan    No significant change in diffuse sclerotic bone metastases on CT images, although several show mildly increased FDG uptake since prior study.  No evidence of soft tissue metastatic disease.    11/02/2016 Imaging    MRI brain- No change from the prior study. Previously noted metastatic deposits have been treated. No new lesions identified.    07/23/2017 -  Chemotherapy    Herceptin + Tykerb     Metastatic breast cancer ([place])   08/26/2015 Initial Diagnosis    Metastatic breast cancer ([place])    03/03/2018 -  Chemotherapy    The patient had ado-trastuzumab emtansine (KADCYLA) 420 mg in sodium chloride 0.9 % 250 mL chemo infusion, 3.4 mg/kg = 440 mg, Intravenous, Once, 2 of 5 cycles Administration: 420 mg (03/04/2018)  for chemotherapy treatment.       Problem List Patient Active Problem List   Diagnosis Date Noted  . Mouth sores [K13.79] 03/18/2016  . Esophageal reflux [K21.9]   . Metastatic breast cancer ([place]) [C50.919]   . HCAP (healthcare-associated pneumonia) [J18.9] 06/02/2015  . Pneumonia [J18.9] 06/02/2015  . 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 Brain metastases ([place]) [C79.31] 01/29/2015  . Genetic testing [Z13.79] 09/05/2014  . Family history of prostate cancer [Z80.42]   . GERD (gastroesophageal reflux disease) [K21.9] 07/16/2014  . DVT (deep venous thrombosis) ([place]) [I82.409] 07/09/2014  . Bone metastases ([place]) [C79.51] 05/23/2014  . Breast cancer, stage 4 ([place]) [C50.919] 05/16/2014  . Hyperlipidemia [E78.5] 04/23/2014  . Depression [F32.9] 04/23/2014    Past Medical History Past Medical History:  Diagnosis Date  . Anticoagulated    xarelto   . Anxiety   . Breast cancer metastasized to multiple sites [facility])    liver, brain, and bone  . Breast cancer, stage 4 [facility]) oncologist-  dr Larene [place] ([facility])   dx 12/ 2015 -- breast cancer Stage 4,  ER/HER2 +,  w/  liver, brain and  bone mets/  chemotherapy and radiation therapy  . Chronic pain syndrome    secondary to cancer   . Depression   . 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 Family history of prostate cancer   . GERD (gastroesophageal reflux disease)   . History of colon polyps    07-13-2013  benign  . History of DVT (deep vein thrombosis)    07-09-2014  upper right extremity-  RIJ and right subclavian--  resolved  . History of gastritis    erosive  . History of pneumonia    HCAP 06-07-2015--  resolved per cxr 07-04-2015  . History of radiation therapy    12 fractions to L3 - S3, 30Gy and left spacula 20Gy (06-20-2014 to 07-10-2014) //  whole brain rxt (10-09-2014 to 10-26-2014)  . History of small bowel obstruction    S/P RESECTION 2008  . Migraine   . PONV (postoperative nausea and vomiting)    pt states scope patch does well    Past Surgical History Past Surgical History:  Procedure Laterality Date  . BREAST REDUCTION SURGERY  03/17/2011   Procedure: MAMMARY REDUCTION BILATERAL (BREAST);  Surgeon: Mary A Contogiannis;  Location: [facility];  Service: Plastics;  Laterality: Bilateral;  . CATARACT EXTRACTION W/ INTRAOCULAR LENS  IMPLANT, BILATERAL  2008  . CERVICAL  FUSION  2003   C5 -- C6  . COLONOSCOPY N/A 07/13/2013   Procedure: COLONOSCOPY;  Surgeon: Rogene Houston, MD;  [facility];  Service: Endoscopy;  Laterality: N/A;  930  . COLONOSCOPY N/A 11/26/2014   Procedure: COLONOSCOPY;  Surgeon: Rogene Houston, MD;  [facility];  Service: Endoscopy;  Laterality: N/A;  730  . D & C HYSTEROSCOPY/  RESECTION ENDOMETRIAL MASS/  [place]  ENDOMETRIAL ABLATION  04-11-2010  . DX LAPAROSCOPY W/ PARTIAL SMALL BOWEL RESECTION AND APPENDECTOMY  04-13-2007  . ESOPHAGOGASTRODUODENOSCOPY N/A 05/25/2014   Procedure: ESOPHAGOGASTRODUODENOSCOPY (EGD);  Surgeon: Rogene Houston, MD;  [facility];  Service: Endoscopy;  Laterality: N/A;  155  . KNEE ARTHROSCOPY Right 2005  . LAPAROSCOPIC ASSISTED VAGINAL HYSTERECTOMY  10-13-2010   w/ Bx Left Fallopian tube and Aspiration Right Ovarian Cyst  . LAPAROSCOPIC CHOLECYSTECTOMY  11-17-2002  . LAPAROSCOPIC SALPINGO OOPHERECTOMY Bilateral 08/26/2015   Procedure: LAPAROSCOPIC SALPINGO OOPHORECTOMY, bilateral;  Surgeon: Everlene Farrier, MD;  Location: [facility];  Service: Gynecology;  Laterality: Bilateral;  . PORTACATH PLACEMENT  05-17-2014  . REDUCTION MAMMAPLASTY    . SHOULDER ARTHROSCOPY WITH ROTATOR CUFF REPAIR Right 2002  . TRANSTHORACIC ECHOCARDIOGRAM  05-18-2014   ef 60-65%//   last MUGA  (08-08-2015)  ef 56.6%      Family History Family History  Problem Relation Age of Onset  . Diabetes Father   . Heart attack Maternal Grandmother 30       multiple over lifetime.  . Cancer Maternal Grandmother 18       NOS  . Prostate cancer Maternal Grandfather        dx in his 68s  . Lung cancer Paternal Grandfather        dx <50  . Lymphoma Maternal Aunt        dx in her 49s  . Melanoma Cousin 53       maternal first cousin  . Brain cancer Cousin        paternal first cousin dx under 36  . Prostate cancer Other        MGF's father  . Colon cancer Other        MGM's mother     Social History  reports that she quit smoking about 23 years ago. Her smoking use included cigarettes. She has never used smokeless tobacco. She reports that she drinks alcohol. She reports that she does not use drugs.  Medications  Current Outpatient Medications:  .  acetaminophen (TYLENOL) 325 MG tablet, Take 650 mg by mouth every 6 (six) hours as needed for moderate pain or headache.,  Disp: , Rfl:  .  calcium carbonate (TUMS - DOSED IN MG ELEMENTAL CALCIUM) 500 MG chewable tablet, Chew 2 tablets by mouth daily. , Disp: , Rfl:  .  Calcium-Phosphorus-Vitamin D (CALCIUM/D3 ADULT GUMMIES PO), Take 2 tablets by mouth 2 (two) times daily. dosage of calcium 1245m and vitamin d 10057m Disp: , Rfl:  .  desvenlafaxine (PRISTIQ) 100 MG 24 hr tablet, Take 1 tablet (100 mg total) by mouth daily., Disp: 90 tablet, Rfl: 1 .  dicyclomine (BENTYL) 20 MG tablet, Take 1 tablet (20 mg total) by mouth 4 (four) times daily -  before meals and at bedtime., Disp: 40 tablet, Rfl: 0 .  diphenhydrAMINE (BENADRYL) 25 mg capsule, Take 25 mg by mouth at bedtime as needed for itching. , Disp: , Rfl:  .  diphenoxylate-atropine (LOMOTIL) 2.5-0.025 MG tablet, Take  2 tablets by mouth 4 (four) times daily as needed for diarrhea or loose stools., Disp: 60 tablet, Rfl: 1 .  DUREZOL 0.05 % EMUL, INSTILL ONE DROP IN THE RIGHT EYE TWICE DAILY TIME ONE WEEK THEN ONCE DAILY, Disp: , Rfl: 2 .  furosemide (LASIX) 20 MG tablet, TAKE 1 TABLET BY MOUTH DAILY, Disp: 30 tablet, Rfl: 2 .  gabapentin (NEURONTIN) 300 MG capsule, TAKE TWO CAPSULES BY MOUTH AT BEDTIME, Disp: 60 capsule, Rfl: 5 .  lidocaine-prilocaine (EMLA) cream, Apply 1 application topically daily as needed (apply to port before chemo)., Disp: 30 g, Rfl: 2 .  LORazepam (ATIVAN) 0.5 MG tablet, TAKE 1 TABLET BY MOUTH EVERY 6 HOURS AS NEEDED FOR ANXIETY, Disp: 30 tablet, Rfl: 1 .  Melatonin 10 MG CAPS, Take 1 tablet by mouth at bedtime. Pt states she takes one tablet at night for sleep , Disp: , Rfl:  .  morphine (MS CONTIN) 30 MG 12 hr tablet, TAKE 1 TABLET BY MOUTH EVERY TWELVE HOURS, Disp: 60 tablet, Rfl: 0 .  NARCAN 4 MG/0.1ML LIQD nasal spray kit, CALL 911. ADMINISTER A SINGLE SPRAY OF NARCAN IN ONE NOSTRIL, REPEAT EVERY 3 MINUTES AS NEEDED IF NO OR MINIMAL RESPONSE, Disp: , Rfl: 99 .  ondansetron (ZOFRAN) 8 MG tablet, TAKE ONE TABLET BY MOUTH EVERY 8 HOURS AS  NEEDED FOR NAUSEA AND VOMITING, Disp: , Rfl: 4 .  oxyCODONE (OXY IR/ROXICODONE) 5 MG immediate release tablet, Take 1-2 tablets (5-10 mg total) by mouth every 4 (four) hours as needed for moderate pain., Disp: 60 tablet, Rfl: 0 .  pantoprazole (PROTONIX) 40 MG tablet, Take 1 tablet (40 mg total) by mouth every 12 (twelve) hours., Disp: 60 tablet, Rfl: 3 .  potassium chloride SA (K-DUR,KLOR-CON) 20 MEQ tablet, Take 1 tablet (20 mEq total) by mouth daily., Disp: 30 tablet, Rfl: 3 .  lidocaine-prilocaine (EMLA) cream, Apply to affected area once, Disp: 30 g, Rfl: 3 .  promethazine (PHENERGAN) 25 MG tablet, TAKE 1 TABLET BY MOUTH EVERY SIX HOURS AS NEEDED FOR NAUSEA OR VOMITING, Disp: 30 tablet, Rfl: 6 .  rivaroxaban (XARELTO) 20 MG TABS tablet, TAKE 1 TABLET BY MOUTH EVERY DAY WITH SUPPER, Disp: 30 tablet, Rfl: 3 .  spironolactone (ALDACTONE) 25 MG tablet, Take 1 tablet (25 mg total) by mouth daily., Disp: 30 tablet, Rfl: 3 .  Zoledronic Acid (ZOMETA IV), Inject into the vein every 3 (three) months., Disp: , Rfl:  .  zolpidem (AMBIEN) 10 MG tablet, TAKE 1 TABLET BY MOUTH AT BEDTIME AS NEEDED FOR SLEEP, Disp: 30 tablet, Rfl: 0 No current facility-administered medications for this visit.   Facility-Administered Medications Ordered in Other Visits:  .  ado-trastuzumab emtansine (KADCYLA) 420 mg in sodium chloride 0.9 % 250 mL chemo infusion, 3.4 mg/kg (Treatment Plan Recorded), Intravenous, Once, Murtaza Shell, MD, Last Rate: 542 mL/hr at 03/25/18 1427, 420 mg at 03/25/18 1427 .  heparin lock flush 100 unit/mL, 500 Units, Intracatheter, Once PRN, Frederik Standley, MD .  sodium chloride flush (NS) 0.9 % injection 10 mL, 10 mL, Intracatheter, PRN, Vin Yonke, MD, 10 mL at 03/25/18 1325  Allergies Patient has no known allergies.  Review of Systems Review of Systems - Oncology ROS negative other than abdominal pain and fatigue   Physical Exam  Vitals Wt Readings from Last 3 Encounters:  03/25/18 270  lb 3.2 oz (122.6 kg)  03/04/18 268 lb 4.8 oz (121.7 kg)  02/24/18 266 lb 8 oz (120.9 kg)   Temp  Readings from Last 3 Encounters:  03/25/18 98.3 F (36.8 C) (Oral)  03/04/18 97.9 F (36.6 C) (Oral)  02/24/18 98.9 F (37.2 C) (Oral)   BP Readings from Last 3 Encounters:  03/25/18 99/76  03/04/18 (!) 125/52  02/24/18 117/74   Pulse Readings from Last 3 Encounters:  03/25/18 96  03/04/18 67  02/24/18 71   Constitutional: Well-developed, well-nourished, and in no distress.   HENT: Head: Normocephalic and atraumatic.  Mouth/Throat: No oropharyngeal exudate. Mucosa moist. Eyes: Pupils are equal, round, and reactive to light. Conjunctivae are normal. No scleral icterus.  Neck: Normal range of motion. Neck supple. No JVD present.  Cardiovascular: Normal rate, regular rhythm and normal heart sounds.  Exam reveals no gallop and no friction rub.   No murmur heard. Pulmonary/Chest: Effort normal and breath sounds normal. No respiratory distress. No wheezes.No rales.  Abdominal: Soft. Bowel sounds are normal. No distension. Mild epigastric tenderness no guarding.   Musculoskeletal: No edema or tenderness.  Lymphadenopathy: No cervical, axillary or supraclavicular adenopathy.  Neurological: Alert and oriented to person, place, and time. No cranial nerve deficit.  Skin: Skin is warm and dry. No rash noted. No erythema. No pallor.  Psychiatric: Affect and judgment normal.   Labs Appointment on 03/25/2018  Component Date Value Ref Range Status  . WBC 03/25/2018 5.8  4.0 - 10.5 K/uL Final  . RBC 03/25/2018 4.27  3.87 - 5.11 MIL/uL Final  . Hemoglobin 03/25/2018 10.1* 12.0 - 15.0 g/dL Final  . HCT 03/25/2018 35.4* 36.0 - 46.0 % Final  . MCV 03/25/2018 82.9  80.0 - 100.0 fL Final  . MCH 03/25/2018 23.7* 26.0 - 34.0 pg Final  . MCHC 03/25/2018 28.5* 30.0 - 36.0 g/dL Final  . RDW 03/25/2018 17.5* 11.5 - 15.5 % Final  . Platelets 03/25/2018 320  150 - 400 K/uL Final  . nRBC 03/25/2018 0.0   0.0 - 0.2 % Final  . Neutrophils Relative % 03/25/2018 70  % Final  . Neutro Abs 03/25/2018 4.1  1.7 - 7.7 K/uL Final  . Lymphocytes Relative 03/25/2018 20  % Final  . Lymphs Abs 03/25/2018 1.1  0.7 - 4.0 K/uL Final  . Monocytes Relative 03/25/2018 8  % Final  . Monocytes Absolute 03/25/2018 0.4  0.1 - 1.0 K/uL Final  . Eosinophils Relative 03/25/2018 1  % Final  . Eosinophils Absolute 03/25/2018 0.0  0.0 - 0.5 K/uL Final  . Basophils Relative 03/25/2018 0  % Final  . Basophils Absolute 03/25/2018 0.0  0.0 - 0.1 K/uL Final  . Immature Granulocytes 03/25/2018 1  % Final  . Abs Immature Granulocytes 03/25/2018 0.05  0.00 - 0.07 K/uL Final   Performed at [facility], [address], [place], [place] [address]  . Sodium 03/25/2018 140  135 - 145 mmol/L Final  . Potassium 03/25/2018 4.0  3.5 - 5.1 mmol/L Final  . Chloride 03/25/2018 100  98 - 111 mmol/L Final  . CO2 03/25/2018 30  22 - 32 mmol/L Final  . Glucose, Bld 03/25/2018 134* 70 - 99 mg/dL Final  . BUN 03/25/2018 11  6 - 20 mg/dL Final  . Creatinine, Ser 03/25/2018 0.92  0.44 - 1.00 mg/dL Final  . Calcium 03/25/2018 9.2  8.9 - 10.3 mg/dL Final  . Total Protein 03/25/2018 7.3  6.5 - 8.1 g/dL Final  . Albumin 03/25/2018 3.5  3.5 - 5.0 g/dL Final  . AST 03/25/2018 31  15 - 41 U/L Final  . ALT 03/25/2018 21  0 -  44 U/L Final  . Alkaline Phosphatase 03/25/2018 107  38 - 126 U/L Final  . Total Bilirubin 03/25/2018 0.3  0.3 - 1.2 mg/dL Final  . GFR calc non Af Amer 03/25/2018 >60  >60 mL/min Final  . GFR calc Af Amer 03/25/2018 >60  >60 mL/min Final   Comment: (NOTE) The eGFR has been calculated using the CKD EPI equation. This calculation has not been validated in all clinical situations. eGFR's persistently <60 mL/min signify possible Chronic Kidney Disease.   Georgiann Hahn gap 03/25/2018 10  5 - 15 Final   Performed at [facility], [address], [place], [place] [address]  . LDH 03/25/2018 227* 98 - 192 U/L Final   Performed at [facility], [address], [place], [place] [address]     Pathology Orders Placed This Encounter  Procedures  . CBC with Differential/Platelet    Standing Status:   Future    Standing Expiration Date:   03/26/2019  . Comprehensive metabolic panel    Standing Status:   Future    Standing Expiration Date:   03/26/2019  . Lactate dehydrogenase    Standing Status:   Future    Standing Expiration Date:   03/26/2019       Zoila Shutter MD

## 2018-03-28 DIAGNOSIS — C50911 Malignant neoplasm of unspecified site of right female breast: Secondary | ICD-10-CM | POA: Diagnosis not present

## 2018-03-28 DIAGNOSIS — C50919 Malignant neoplasm of unspecified site of unspecified female breast: Secondary | ICD-10-CM | POA: Diagnosis not present

## 2018-03-28 DIAGNOSIS — C7951 Secondary malignant neoplasm of bone: Secondary | ICD-10-CM | POA: Diagnosis not present

## 2018-03-28 DIAGNOSIS — Z510 Encounter for antineoplastic radiation therapy: Secondary | ICD-10-CM | POA: Diagnosis not present

## 2018-03-28 DIAGNOSIS — C7931 Secondary malignant neoplasm of brain: Secondary | ICD-10-CM | POA: Diagnosis not present

## 2018-03-29 DIAGNOSIS — C50919 Malignant neoplasm of unspecified site of unspecified female breast: Secondary | ICD-10-CM | POA: Diagnosis not present

## 2018-03-29 DIAGNOSIS — C50911 Malignant neoplasm of unspecified site of right female breast: Secondary | ICD-10-CM | POA: Diagnosis not present

## 2018-03-29 DIAGNOSIS — C7951 Secondary malignant neoplasm of bone: Secondary | ICD-10-CM | POA: Diagnosis not present

## 2018-03-29 DIAGNOSIS — C7931 Secondary malignant neoplasm of brain: Secondary | ICD-10-CM | POA: Diagnosis not present

## 2018-03-29 DIAGNOSIS — Z510 Encounter for antineoplastic radiation therapy: Secondary | ICD-10-CM | POA: Diagnosis not present

## 2018-03-30 DIAGNOSIS — C7951 Secondary malignant neoplasm of bone: Secondary | ICD-10-CM | POA: Diagnosis not present

## 2018-03-30 DIAGNOSIS — Z510 Encounter for antineoplastic radiation therapy: Secondary | ICD-10-CM | POA: Diagnosis not present

## 2018-03-30 DIAGNOSIS — C7931 Secondary malignant neoplasm of brain: Secondary | ICD-10-CM | POA: Diagnosis not present

## 2018-03-30 DIAGNOSIS — C50911 Malignant neoplasm of unspecified site of right female breast: Secondary | ICD-10-CM | POA: Diagnosis not present

## 2018-03-31 ENCOUNTER — Other Ambulatory Visit (HOSPITAL_COMMUNITY): Payer: Self-pay | Admitting: *Deleted

## 2018-03-31 DIAGNOSIS — C50911 Malignant neoplasm of unspecified site of right female breast: Secondary | ICD-10-CM | POA: Diagnosis not present

## 2018-03-31 DIAGNOSIS — C229 Malignant neoplasm of liver, not specified as primary or secondary: Secondary | ICD-10-CM

## 2018-03-31 DIAGNOSIS — C50919 Malignant neoplasm of unspecified site of unspecified female breast: Secondary | ICD-10-CM

## 2018-03-31 DIAGNOSIS — Z510 Encounter for antineoplastic radiation therapy: Secondary | ICD-10-CM | POA: Diagnosis not present

## 2018-03-31 DIAGNOSIS — C7951 Secondary malignant neoplasm of bone: Secondary | ICD-10-CM

## 2018-03-31 DIAGNOSIS — Z0000 Encounter for general adult medical examination without abnormal findings: Secondary | ICD-10-CM

## 2018-03-31 DIAGNOSIS — R5382 Chronic fatigue, unspecified: Secondary | ICD-10-CM

## 2018-03-31 DIAGNOSIS — C7931 Secondary malignant neoplasm of brain: Secondary | ICD-10-CM | POA: Diagnosis not present

## 2018-03-31 MED ORDER — MORPHINE SULFATE ER 30 MG PO TBCR: EXTENDED_RELEASE_TABLET | ORAL | 0 refills | Status: DC

## 2018-04-01 DIAGNOSIS — C50911 Malignant neoplasm of unspecified site of right female breast: Secondary | ICD-10-CM | POA: Diagnosis not present

## 2018-04-01 DIAGNOSIS — C7951 Secondary malignant neoplasm of bone: Secondary | ICD-10-CM | POA: Diagnosis not present

## 2018-04-01 DIAGNOSIS — Z510 Encounter for antineoplastic radiation therapy: Secondary | ICD-10-CM | POA: Diagnosis not present

## 2018-04-01 DIAGNOSIS — C7931 Secondary malignant neoplasm of brain: Secondary | ICD-10-CM | POA: Diagnosis not present

## 2018-04-04 DIAGNOSIS — C50911 Malignant neoplasm of unspecified site of right female breast: Secondary | ICD-10-CM | POA: Diagnosis not present

## 2018-04-04 DIAGNOSIS — C7931 Secondary malignant neoplasm of brain: Secondary | ICD-10-CM | POA: Diagnosis not present

## 2018-04-04 DIAGNOSIS — C7951 Secondary malignant neoplasm of bone: Secondary | ICD-10-CM | POA: Diagnosis not present

## 2018-04-04 DIAGNOSIS — Z510 Encounter for antineoplastic radiation therapy: Secondary | ICD-10-CM | POA: Diagnosis not present

## 2018-04-05 DIAGNOSIS — C50911 Malignant neoplasm of unspecified site of right female breast: Secondary | ICD-10-CM | POA: Diagnosis not present

## 2018-04-05 DIAGNOSIS — C7931 Secondary malignant neoplasm of brain: Secondary | ICD-10-CM | POA: Diagnosis not present

## 2018-04-05 DIAGNOSIS — C50919 Malignant neoplasm of unspecified site of unspecified female breast: Secondary | ICD-10-CM | POA: Diagnosis not present

## 2018-04-05 DIAGNOSIS — C7951 Secondary malignant neoplasm of bone: Secondary | ICD-10-CM | POA: Diagnosis not present

## 2018-04-05 DIAGNOSIS — Z510 Encounter for antineoplastic radiation therapy: Secondary | ICD-10-CM | POA: Diagnosis not present

## 2018-04-12 DIAGNOSIS — M24112 Other articular cartilage disorders, left shoulder: Secondary | ICD-10-CM | POA: Diagnosis not present

## 2018-04-12 DIAGNOSIS — M7542 Impingement syndrome of left shoulder: Secondary | ICD-10-CM | POA: Diagnosis not present

## 2018-04-12 DIAGNOSIS — M7522 Bicipital tendinitis, left shoulder: Secondary | ICD-10-CM | POA: Diagnosis not present

## 2018-04-12 DIAGNOSIS — M75112 Incomplete rotator cuff tear or rupture of left shoulder, not specified as traumatic: Secondary | ICD-10-CM | POA: Diagnosis not present

## 2018-04-12 DIAGNOSIS — S43492D Other sprain of left shoulder joint, subsequent encounter: Secondary | ICD-10-CM | POA: Diagnosis not present

## 2018-04-12 DIAGNOSIS — G8918 Other acute postprocedural pain: Secondary | ICD-10-CM | POA: Diagnosis not present

## 2018-04-15 ENCOUNTER — Encounter (HOSPITAL_COMMUNITY): Payer: Self-pay

## 2018-04-15 ENCOUNTER — Inpatient Hospital Stay (HOSPITAL_COMMUNITY): Payer: Medicare Other

## 2018-04-15 ENCOUNTER — Other Ambulatory Visit: Payer: Self-pay

## 2018-04-15 ENCOUNTER — Inpatient Hospital Stay (HOSPITAL_COMMUNITY): Payer: Medicare Other | Attending: Hematology

## 2018-04-15 ENCOUNTER — Inpatient Hospital Stay (HOSPITAL_BASED_OUTPATIENT_CLINIC_OR_DEPARTMENT_OTHER): Payer: Medicare Other | Admitting: Internal Medicine

## 2018-04-15 VITALS — BP 96/72 | HR 90 | Temp 98.8°F | Resp 18 | Wt 274.7 lb

## 2018-04-15 DIAGNOSIS — G894 Chronic pain syndrome: Secondary | ICD-10-CM | POA: Diagnosis not present

## 2018-04-15 DIAGNOSIS — Z86010 Personal history of colonic polyps: Secondary | ICD-10-CM

## 2018-04-15 DIAGNOSIS — Z9221 Personal history of antineoplastic chemotherapy: Secondary | ICD-10-CM | POA: Diagnosis not present

## 2018-04-15 DIAGNOSIS — C7931 Secondary malignant neoplasm of brain: Secondary | ICD-10-CM

## 2018-04-15 DIAGNOSIS — R978 Other abnormal tumor markers: Secondary | ICD-10-CM

## 2018-04-15 DIAGNOSIS — C787 Secondary malignant neoplasm of liver and intrahepatic bile duct: Secondary | ICD-10-CM | POA: Insufficient documentation

## 2018-04-15 DIAGNOSIS — K219 Gastro-esophageal reflux disease without esophagitis: Secondary | ICD-10-CM | POA: Diagnosis not present

## 2018-04-15 DIAGNOSIS — E876 Hypokalemia: Secondary | ICD-10-CM | POA: Diagnosis not present

## 2018-04-15 DIAGNOSIS — Z806 Family history of leukemia: Secondary | ICD-10-CM | POA: Insufficient documentation

## 2018-04-15 DIAGNOSIS — Z90710 Acquired absence of both cervix and uterus: Secondary | ICD-10-CM | POA: Insufficient documentation

## 2018-04-15 DIAGNOSIS — D649 Anemia, unspecified: Secondary | ICD-10-CM | POA: Diagnosis not present

## 2018-04-15 DIAGNOSIS — C7951 Secondary malignant neoplasm of bone: Secondary | ICD-10-CM

## 2018-04-15 DIAGNOSIS — Z8042 Family history of malignant neoplasm of prostate: Secondary | ICD-10-CM | POA: Diagnosis not present

## 2018-04-15 DIAGNOSIS — C50919 Malignant neoplasm of unspecified site of unspecified female breast: Secondary | ICD-10-CM

## 2018-04-15 DIAGNOSIS — Z923 Personal history of irradiation: Secondary | ICD-10-CM

## 2018-04-15 DIAGNOSIS — G893 Neoplasm related pain (acute) (chronic): Secondary | ICD-10-CM | POA: Diagnosis not present

## 2018-04-15 DIAGNOSIS — R51 Headache: Secondary | ICD-10-CM | POA: Diagnosis not present

## 2018-04-15 DIAGNOSIS — Z90722 Acquired absence of ovaries, bilateral: Secondary | ICD-10-CM | POA: Insufficient documentation

## 2018-04-15 DIAGNOSIS — Z79899 Other long term (current) drug therapy: Secondary | ICD-10-CM

## 2018-04-15 DIAGNOSIS — Z87891 Personal history of nicotine dependence: Secondary | ICD-10-CM | POA: Insufficient documentation

## 2018-04-15 DIAGNOSIS — Z170 Estrogen receptor positive status [ER+]: Secondary | ICD-10-CM | POA: Diagnosis not present

## 2018-04-15 DIAGNOSIS — Z801 Family history of malignant neoplasm of trachea, bronchus and lung: Secondary | ICD-10-CM

## 2018-04-15 DIAGNOSIS — Z5112 Encounter for antineoplastic immunotherapy: Secondary | ICD-10-CM | POA: Diagnosis not present

## 2018-04-15 DIAGNOSIS — F329 Major depressive disorder, single episode, unspecified: Secondary | ICD-10-CM | POA: Diagnosis not present

## 2018-04-15 DIAGNOSIS — Z86718 Personal history of other venous thrombosis and embolism: Secondary | ICD-10-CM | POA: Diagnosis not present

## 2018-04-15 DIAGNOSIS — C50912 Malignant neoplasm of unspecified site of left female breast: Secondary | ICD-10-CM

## 2018-04-15 DIAGNOSIS — R7989 Other specified abnormal findings of blood chemistry: Secondary | ICD-10-CM | POA: Insufficient documentation

## 2018-04-15 LAB — CBC WITH DIFFERENTIAL/PLATELET
ABS IMMATURE GRANULOCYTES: 0.06 10*3/uL (ref 0.00–0.07)
BASOS PCT: 0 %
Basophils Absolute: 0 10*3/uL (ref 0.0–0.1)
Eosinophils Absolute: 0 10*3/uL (ref 0.0–0.5)
Eosinophils Relative: 0 %
HCT: 31.9 % — ABNORMAL LOW (ref 36.0–46.0)
HEMOGLOBIN: 9 g/dL — AB (ref 12.0–15.0)
IMMATURE GRANULOCYTES: 1 %
LYMPHS PCT: 11 %
Lymphs Abs: 0.6 10*3/uL — ABNORMAL LOW (ref 0.7–4.0)
MCH: 23.4 pg — AB (ref 26.0–34.0)
MCHC: 28.2 g/dL — ABNORMAL LOW (ref 30.0–36.0)
MCV: 83.1 fL (ref 80.0–100.0)
MONO ABS: 0.5 10*3/uL (ref 0.1–1.0)
Monocytes Relative: 8 %
NEUTROS ABS: 4.8 10*3/uL (ref 1.7–7.7)
NEUTROS PCT: 80 %
PLATELETS: 212 10*3/uL (ref 150–400)
RBC: 3.84 MIL/uL — AB (ref 3.87–5.11)
RDW: 19 % — ABNORMAL HIGH (ref 11.5–15.5)
WBC: 6 10*3/uL (ref 4.0–10.5)
nRBC: 0.5 % — ABNORMAL HIGH (ref 0.0–0.2)

## 2018-04-15 LAB — COMPREHENSIVE METABOLIC PANEL
ALK PHOS: 109 U/L (ref 38–126)
ALT: 19 U/L (ref 0–44)
AST: 34 U/L (ref 15–41)
Albumin: 3.3 g/dL — ABNORMAL LOW (ref 3.5–5.0)
Anion gap: 8 (ref 5–15)
BUN: 8 mg/dL (ref 6–20)
CALCIUM: 8.7 mg/dL — AB (ref 8.9–10.3)
CHLORIDE: 100 mmol/L (ref 98–111)
CO2: 29 mmol/L (ref 22–32)
CREATININE: 0.74 mg/dL (ref 0.44–1.00)
GFR calc Af Amer: >60 mL/min (ref >60)
Glucose, Bld: 115 mg/dL — ABNORMAL HIGH (ref 70–99)
Potassium: 3.4 mmol/L — ABNORMAL LOW (ref 3.5–5.1)
SODIUM: 137 mmol/L (ref 135–145)
Total Bilirubin: 0.2 mg/dL — ABNORMAL LOW (ref 0.3–1.2)
Total Protein: 6.8 g/dL (ref 6.5–8.1)

## 2018-04-15 LAB — LACTATE DEHYDROGENASE: LDH: 182 U/L (ref 98–192)

## 2018-04-15 LAB — MAGNESIUM: Magnesium: 2.3 mg/dL (ref 1.7–2.4)

## 2018-04-15 MED ORDER — DIPHENHYDRAMINE HCL 25 MG PO CAPS
50.0000 mg | ORAL_CAPSULE | Freq: Once | ORAL | Status: AC
  Administered 2018-04-15: 50 mg via ORAL

## 2018-04-15 MED ORDER — ACETAMINOPHEN 325 MG PO TABS
650.0000 mg | ORAL_TABLET | Freq: Once | ORAL | Status: AC
  Administered 2018-04-15: 650 mg via ORAL

## 2018-04-15 MED ORDER — SODIUM CHLORIDE 0.9 % IV SOLN
Freq: Once | INTRAVENOUS | Status: AC
  Administered 2018-04-15: 11:00:00 via INTRAVENOUS

## 2018-04-15 MED ORDER — ZOLEDRONIC ACID 4 MG/100ML IV SOLN
4.0000 mg | Freq: Once | INTRAVENOUS | Status: AC
  Administered 2018-04-15: 4 mg via INTRAVENOUS
  Filled 2018-04-15: qty 100

## 2018-04-15 MED ORDER — DIPHENHYDRAMINE HCL 25 MG PO CAPS
ORAL_CAPSULE | ORAL | Status: AC
  Filled 2018-04-15: qty 2

## 2018-04-15 MED ORDER — HEPARIN SOD (PORK) LOCK FLUSH 100 UNIT/ML IV SOLN
500.0000 [IU] | Freq: Once | INTRAVENOUS | Status: AC | PRN
  Administered 2018-04-15: 500 [IU]

## 2018-04-15 MED ORDER — SODIUM CHLORIDE 0.9% FLUSH
10.0000 mL | INTRAVENOUS | Status: DC | PRN
  Administered 2018-04-15: 10 mL
  Filled 2018-04-15: qty 10

## 2018-04-15 MED ORDER — ACETAMINOPHEN 325 MG PO TABS
ORAL_TABLET | ORAL | Status: AC
  Filled 2018-04-15: qty 2

## 2018-04-15 MED ORDER — SODIUM CHLORIDE 0.9 % IV SOLN
420.0000 mg | Freq: Once | INTRAVENOUS | Status: AC
  Administered 2018-04-15: 420 mg via INTRAVENOUS
  Filled 2018-04-15: qty 16

## 2018-04-15 NOTE — Progress Notes (Signed)
Pt presents today ambulatory for Kadcyla/ Zometa. VSS. Pt has fatigue since last visit and constipation. Pt had surgery last week and finished radiation.   Per Dr. Walden Field. Labs reviewed. Proceed with treatment.   Treatment given today per MD orders. Tolerated infusion without adverse affects. Vital signs stable. No complaints at this time. Discharged from clinic ambulatory. F/U with [facility] as scheduled.

## 2018-04-15 NOTE — Patient Instructions (Signed)
[facility] Discharge Instructions for Patients Receiving Chemotherapy  Today you received the following chemotherapy agents   To help prevent nausea and vomiting after your treatment, we encourage you to take your nausea medication   If you develop nausea and vomiting that is not controlled by your nausea medication, call the clinic.   BELOW ARE SYMPTOMS THAT SHOULD BE REPORTED IMMEDIATELY:  *FEVER GREATER THAN 100.5 F  *CHILLS WITH OR WITHOUT FEVER  NAUSEA AND VOMITING THAT IS NOT CONTROLLED WITH YOUR NAUSEA MEDICATION  *UNUSUAL SHORTNESS OF BREATH  *UNUSUAL BRUISING OR BLEEDING  TENDERNESS IN MOUTH AND THROAT WITH OR WITHOUT PRESENCE OF ULCERS  *URINARY PROBLEMS  *BOWEL PROBLEMS  UNUSUAL RASH Items with * indicate a potential emergency and should be followed up as soon as possible.  Feel free to call the clinic should you have any questions or concerns. The clinic phone number is [phone].  Please show the CHEMO ALERT CARD at check-in to the Emergency Department and triage nurse.   

## 2018-04-15 NOTE — Progress Notes (Signed)
Diagnosis Malignant neoplasm of breast, stage 4, unspecified laterality (HCC)  Metastatic breast cancer ([place]) - Plan: CBC with Differential/Platelet, Comprehensive metabolic panel, Lactate dehydrogenase, CBC with Differential/Platelet, Comprehensive metabolic panel, Lactate dehydrogenase, Magnesium, Magnesium, DISCONTINUED: 0.9 %  sodium chloride infusion, DISCONTINUED: sodium chloride flush (NS) 0.9 % injection 10 mL, DISCONTINUED: heparin lock flush 100 unit/mL, DISCONTINUED: acetaminophen (TYLENOL) tablet 650 mg, DISCONTINUED: diphenhydrAMINE (BENADRYL) capsule 50 mg, DISCONTINUED: ado-trastuzumab emtansine (KADCYLA) 440 mg in sodium chloride 0.9 % 250 mL chemo infusion  Staging Cancer Staging Breast cancer, stage 4 (HCC) Staging form: Breast, AJCC 7th Edition - Clinical stage from 05/27/2014: Stage IV (T0, N0, M1) - Signed by Baird Cancer, PA-C on 05/27/2014   Assessment and Plan:   1.  52 yr old female with Stage IV adenocarcinoma of breast, ER+, HER2 + with metastatic disease to brain and bone. She is S/P multiple lines of therapy. Primary tumor never identified. She is S/P BSO by Dr. Gaetano Net on 08/27/2015. Pt was treated with Herceptin + Tykerb beginning on 07/23/2017 and she continues on bone targeted therapy with Zometa  to reduce SRE and its antitumor effect in bone. Restaging PET scan on 10/18/2017 demonstrated diffuse sclerotic bone metastases with mild improvement noted at several sites without any new or progressive metastatic disease or evidence of soft tissue metastatic disease. Restaging MRI brain on 10/27/2017 was negative for intracranial disease.  ECHO done 01/17/2018 shows EF 60-65%.      PET scan done 02/21/2018 reviewed and showed   IMPRESSION: 1. Multifocal hypermetabolic sclerotic bone metastases are identified. Some of these lesions appear increased in size in the interval including the right scapula and T10 vertebra. New abnormal uptake is identified within the T11  vertebra. Other lesions exhibit similar degrees of hypermetabolism. 2. No hypermetabolic nodal or solid organ metastases  MRI of brain done 02/23/2018 reviewed and showed  IMPRESSION: Stable exam. Mild atrophy and small vessel disease. No acute intracranial abnormality.  Satisfactory post treatment changes. No new or enhancing lesions are Identified.  Recent imaging with PET scan and bone scans appears consistent with progression with new T11 lesion and increase in size of T10 and right scapula lesion.    Bone scan done 03/02/2018 reviewed and showed  FINDINGS: Multiple sites of abnormal osseous tracer accumulation consistent with osseous metastatic disease, including RIGHT glenoid, RIGHT humeral head, anterior RIGHT sixth rib, BILATERAL posterior ribs, T7 and T10 vertebral bodies, sacrum, anterior LEFT iliac bone, RIGHT SI joint, and RIGHT femoral head/neck junction.  Tumor markers have increased from CA 15-3 of 70 and 27.29 of 80 in 11/2017 to CA 15-3 of 102 and CA 27.29 of 110 on labs done 02/24/2018.    Pt underwent MRI of thoracic and lumbar spine for further evaluation of low back pain and T10 and T 11 lesions.   MRI done 03/11/2018 reviewed and showed  IMPRESSION: 1. Known multifocal osseous metastatic disease most notable at T10 where there is left preferential epidural tumor extension causing left foraminal impingement at T10-11. No cord mass effect. 2. No pathologic fracture.  For patients who progress after initial herceptin and a taxane in the metastatic setting, or after both trastuzumab- and lapatinib-containing regimens, T-DM1 is an active agent, provided they have not received it previously.  Review of chart shows no piror treatment and pt denies previous therapy with Kadcyla.    Recommendation was based on results of EMILIA trial and a second phase III trial, called TH3RESA.  Pts on this trial noted improvement in  PFS and OS.    Treatment with T-DM1 was not  associated with an increased incidence of serious (grade 3/4) adverse events. Based on these data, T-DM1 should be administered in patients who progress on prior HER2-directed therapies. Retrospective data suggest activity of T-DM1 among those treated with prior Pertuzumab suggesting a clinical benefit even in the setting of prior pertuzumab use.  Side effects of the medication previously reviewed with the pt and she was provided written information.  She is advised to discontinue Lapatinib.  Pt was also given option of referral  to [facility] center for clinical trial consideration.  She will also receive Zometa for bone strengthening every 3 weeks with Kadcyla. which will be dosed at 3.6 mg/kg every 3 weeks.    PET scan reports a new area at T11.  No cord compression reported.  She also has reported increase in lesion in right scapula and T10 vertebra.  Other lesions are stable and there is no evidence of solid organ mets.    Pt was seen by RT for evaluation in [place] as she has been seen there before.  She has completed RT and feels no improvement in pain score.   Labs done 04/15/2018 reviewed and showed WBC 6 HB 9 plts 212,000.  Chemistries WNL with K+ 3.4 Cr 0.74 and normal LFTs.  Will repeat labs and tumor markers in 05/2018.    Pt will continue treatment every 3 weeks and will be seen for follow-up in 6 weeks.  Anticipate 3 months of therapy with repeat imaging.    Pt has low back pain but is ambulatory.    Pt has known problems with left arm and has undergone surgery and is scheduled for PT.    2.  Headache/Brain Mets.  Pt reports this is improved.   MRI of brain done 10/2017 was negative.  She reports history of migraine  Follow-up with PCP or neurology for management.  She is on morphine.  MRI of brain for ongoing follow-up done 02/23/2018 shows IMPRESSION: Stable exam. Mild atrophy and small vessel disease. No acute intracranial abnormality.  Satisfactory post treatment changes. No new or  enhancing lesions are Identified  Pt will have repeat imaging in 3 months.    3.  Bone mets.  Zometa every 3-4 weeks.   4.  Hypokalemia.  Labs done 04/15/2018 reviewed and showed K+ 3.4.  Mg level WNL at 2.3.  Continue potassium supplementation.    5  Mild abdominal pain/constipation.  She reports this is not new.  Tums prn.  Recent imaging showed adenopathy in abdomen.  Pt advised to use stool regimen due to narcotic use.    6.  Left arm injury.  Pt has undergone surgery and has arm in sling.  She reports she is scheduled for PT.    7.  Anemia. HB 9. Likely due to recent surgery and RT. Will repeat labs on RTC prior to C4.    25  minutes spent with more than 50% spent in counseling and coordination of care.    Interval History:  Historical data obtained from the note dated 10/29/2017.  52 yr old female with Stage IV adenocarcinoma of breast, ER+, HER2 + with metastatic disease to brain and bone. She is S/P multiple lines of therapy. Primary tumor never identified. She is S/P BSO by Dr. Gaetano Net on 08/27/2015. Current therapy is Herceptin + Tykerb beginning on 07/23/2017 and she continues on bone targeted therapy with Zometa every 3 months to reduce SRE and its  antitumor effect in bone. Restaging PET scan on 10/18/2017 demonstrated diffuse sclerotic bone metastases with mild improvement noted at several sites without any new or progressive metastatic disease or evidence of soft tissue metastatic disease. Restaging MRI brain on 10/27/2017 was negative for intracranial disease.  Current Status:  Pt is seen today for follow-up prior to C3 of Kadcyla.  She has undergone surgery.  She has completed RT.      Breast cancer, stage 4 ([place])   04/25/2014 Initial Diagnosis    Breast cancer, stage 4    04/25/2014 Imaging    CT abdomen/pelvis with widespread metastatic disease to the liver, multiple lytic lesions throughout spine and pelvis. No FX or epidural tumor identified    04/26/2014 Imaging    CT  head unremarkable    04/26/2014 Imaging    CT chest with no lung mass or pulmonary nodules, no adenopathy. Lytic bone lesions, right 2nd rib    04/27/2014 Initial Biopsy    U/S guided liver biopsy, lesion in anterior and inferior left hepatic lobe biopsied    04/27/2014 Pathology Results    Metastatic adenocarcinoma, CK7, ER+, patchy positivity with PR. Possible primary includes breast, less likely gynecologic    05/15/2014 Mammogram    BI-RADS CATEGORY  2: Benign Finding(s)    05/16/2014 PET scan    1. Intensely hypermetabolic hepatic metastasis. 2. Widespread hypermetabolic skeletal lesions. 3. No primary adenocarcinoma identified by FDG PET imaging.    05/19/2014 Imaging    MUGA- Left ventricular ejection fracture greater than 70%.    05/21/2014 Breast MRI    No suspicious masses or enhancement within the breasts. No axillary adenopathy.    05/22/2014 - 07/03/2014 Antibody Plan    Herceptin/Perjeta/Tamoxifen    06/12/2014 - 07/03/2014 Chemotherapy    Taxotere added secondary to persistent abdominal and back pain    06/17/2014 - 06/19/2014 Hospital Admission    Neutropenia, fever, diarrhea, nausea, vomiting    06/20/2014 - 07/10/2014 Radiation Therapy    Dr. Thea Silversmith 12 fractions to L3-S3 (30 Gy) and left scapula (20 Gy).     07/03/2014 Adverse Reaction    Perjeta- induced diarrhea.  Perjeta discontinued    07/16/2014 - 07/20/2014 Hospital Admission    Electrolyte abnormalities, and diarrhea.  Suspect Perjeta-induced diarrhea.  Negative GI work-up.    07/24/2014 - 08/19/2015 Chemotherapy    Herceptin/Tamoxifen/Xgeva    08/21/2014 Imaging    MUGA- Left ventricular ejection fraction equals 71%.    08/24/2014 PET scan    Dramatic reduction in metabolic activity of the widespread liver metastasis. Liver metastasis now have metabolic activity equal to background normal liver activity. Liver has a nodular contour. Marked reduction in metabolic activity of skeletal lesions..    10/05/2014  Progression    Widespread metastatic disease to the brain as described. Between 20 and 30 intracranial metastatic deposits are now seen. No midline shift or incipient herniation    10/09/2014 - 10/26/2014 Radiation Therapy    Whole Brain XRT    11/14/2014 Imaging    MUGA- LVEF 67%    02/13/2015 Imaging    MUGA- LVEF 59%    02/15/2015 Treatment Plan Change    Due to declining LVEF, will hold Herceptin per PI guidelines.    04/12/2015 -  Chemotherapy    Herceptin restarted    06/02/2015 - 06/08/2015 Hospital Admission    Pneumonia    07/05/2015 Progression     PET/CT concern for mild progression of skeletal metastasis with several lesions within the spine  and 1 lesion in the Left iliac wing with mild to moderate metabolic activity new from prior. Rising CA 27-29    07/16/2015 - 10/23/2015 Anti-estrogen oral therapy    Arimidex    07/16/2015 Imaging    MRI brain with satisfactory post treatment apperance of brain. interval resolved enhancing R caudate metastasis, minimal punctate residual enhancing metastatic disease at the inferior L cerebellum. No new metastatic disease or new intracranial abnormality    07/19/2015 Treatment Plan Change    Discontinue Tamoxifen, Zoladex plus Arimidex.     08/27/2015 Procedure    Laparoscopic bilateral salpingo-oophorectomy by Dr. Gaetano Net    10/17/2015 PET scan    Osseous metastatic disease appears slightly progressive based on a new right scapular lesion and increased uptake within lesions in the thoracic spine, left iliac wing and  proximal right femur.    10/17/2015 Progression    Slight progression on PET scan imaging.    10/18/2015 Imaging    REsolved enhancing metastatic disease to the brain status post WBXRT    10/23/2015 - 02/12/2016 Adjuvant Chemotherapy    Faslodex loading followed by maintenance dose.  (Herceptin continued)    11/04/2015 Imaging    MUGA- LEFT ventricular ejection fraction 51% slightly decreased in a 57% on the previous exam.     12/24/2015 Treatment Plan Change    Zometa every 28 days.  Xgeva discontinued.      01/01/2016 Imaging    MUGA- Left ventricular ejection fraction equals 57.9%. This is increased from 51.1% previously.    02/03/2016 PET scan    1. Mixed metabolic changes in the scattered hypermetabolic sclerotic osseous metastases throughout the axial and proximal appendicular skeleton as detailed above. 2. No new sites of hypermetabolic metastatic disease. Stable pseudo-cirrhotic appearance of the liver due to treated liver metastases with no hypermetabolic liver metastases.     02/03/2016 Progression    PET shows mixed osseous response.  Some lesions more hypermetabolic, others improved.    02/05/2016 Treatment Plan Change    D/C Herceptin.  Faslodex as scheduled on 02/12/2016, then discontinued.  Continue Zometa.    02/05/2016 Treatment Plan Change    Prescriptions for Xeloda 7 days on and 7 days off and Tykerb printed and provided for authorization.    02/19/2016 -  Chemotherapy    Xeloda 2300 mg BID 7 days on and 7 days off and Tykerb     02/24/2016 Treatment Plan Change    Xeloda dose reduced by 10% to 2000 mg BID week on and week off.    03/18/2016 Treatment Plan Change    Xeloda dose reduced to 2000 mg in AM and 1500 mg in PM 7 days on and 7 days off.    03/23/2016 Imaging    MUGA- Normal LEFT ventricular ejection fraction of 56% not significantly changed from 58% on previous exam.    05/19/2016 Imaging    MRI brain- . No new focus of abnormal enhancement to suggest interval metastatic disease. 2. No acute intracranial abnormality. 3. Stable foci of T2 FLAIR hyperintensity in white matter and in the right caudate head compatible with posttreatment changes and treated metastasis.    05/20/2016 PET scan    1. The multiple osseous metastatic lesions shown to be hypermetabolic on the prior exam are reduced in activity or even resolved in hypermetabolic activity compared to prior, as  detailed above. There are also numerous sclerotic bony lesions wedge are not currently and were not previously hypermetabolic, compatible with old non active lesions. 2.  No findings of extra osseous metastatic disease currently. 3. Hepatic pseudocirrhosis. 4. Healing rib fractures. Chronic pathologic fracture the left acromion.    07/08/2016 Imaging    MUGA- Low normal LEFT ventricular ejection fraction of 53%, little changed since the 56% on 03/23/2016 but slightly decreased from the 58% on 01/01/2016.    09/07/2016 Echocardiogram    MUGA- Normal LEFT ventricular ejection fraction of 59% with normal LV wall motion.    09/07/2016 Treatment Plan Change    Xeloda dose reduced to 1500 mg BID 7 days on and 7 days off.    09/15/2016 PET scan    No significant change in diffuse sclerotic bone metastases on CT images, although several show mildly increased FDG uptake since prior study.  No evidence of soft tissue metastatic disease.    11/02/2016 Imaging    MRI brain- No change from the prior study. Previously noted metastatic deposits have been treated. No new lesions identified.    07/23/2017 -  Chemotherapy    Herceptin + Tykerb     Metastatic breast cancer ([place])   08/26/2015 Initial Diagnosis    Metastatic breast cancer ([place])    03/03/2018 -  Chemotherapy    The patient had ado-trastuzumab emtansine (KADCYLA) 420 mg in sodium chloride 0.9 % 250 mL chemo infusion, 3.4 mg/kg = 440 mg, Intravenous, Once, 3 of 5 cycles Administration: 420 mg (03/04/2018), 420 mg (03/25/2018), 420 mg (04/15/2018)  for chemotherapy treatment.       Problem List Patient Active Problem List   Diagnosis Date Noted  . Mouth sores [K13.79] 03/18/2016  . Esophageal reflux [K21.9]   . Metastatic breast cancer ([place]) [C50.919]   . HCAP (healthcare-associated pneumonia) [J18.9] 06/02/2015  . Pneumonia [J18.9] 06/02/2015  . 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 Brain metastases ([place])  [C79.31] 01/29/2015  . Genetic testing [Z13.79] 09/05/2014  . Family history of prostate cancer [Z80.42]   . GERD (gastroesophageal reflux disease) [K21.9] 07/16/2014  . DVT (deep venous thrombosis) ([place]) [I82.409] 07/09/2014  . Bone metastases ([place]) [C79.51] 05/23/2014  . Breast cancer, stage 4 ([place]) [C50.919] 05/16/2014  . Hyperlipidemia [E78.5] 04/23/2014  . Depression [F32.9] 04/23/2014    Past Medical History Past Medical History:  Diagnosis Date  . Anticoagulated    xarelto  . Anxiety   . Breast cancer metastasized to multiple sites [facility])    liver, brain, and bone  . Breast cancer, stage 4 [facility]) oncologist-  dr Larene [place] ([facility])   dx 12/ 2015 -- breast cancer Stage 4,  ER/HER2 +,  w/  liver, brain and  bone mets/  chemotherapy and radiation therapy  . Chronic pain syndrome    secondary to cancer   . Depression   . 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 Family history of prostate cancer   . GERD (gastroesophageal reflux disease)   . History of colon polyps    07-13-2013  benign  . History of DVT (deep vein thrombosis)    07-09-2014  upper right extremity-  RIJ and right subclavian--  resolved  . History of gastritis    erosive  . History of pneumonia    HCAP 06-07-2015--  resolved per cxr 07-04-2015  . History of radiation therapy    12 fractions to L3 - S3, 30Gy and left spacula 20Gy (06-20-2014 to 07-10-2014) //  whole brain rxt (10-09-2014 to 10-26-2014)  . History of small bowel obstruction  S/P RESECTION 2008  . Migraine   . PONV (postoperative nausea and vomiting)    pt states scope patch does well    Past Surgical History Past Surgical History:  Procedure Laterality Date  . BREAST REDUCTION SURGERY  03/17/2011   Procedure: MAMMARY REDUCTION BILATERAL (BREAST);  Surgeon: Mary A Contogiannis;  Location: [place];  Service: Plastics;  Laterality:  Bilateral;  . CATARACT EXTRACTION W/ INTRAOCULAR LENS  IMPLANT, BILATERAL  2008  . CERVICAL FUSION  2003   C5 -- C6  . COLONOSCOPY N/A 07/13/2013   Procedure: COLONOSCOPY;  Surgeon: Rogene Houston, MD;  [facility];  Service: Endoscopy;  Laterality: N/A;  930  . COLONOSCOPY N/A 11/26/2014   Procedure: COLONOSCOPY;  Surgeon: Rogene Houston, MD;  [facility];  Service: Endoscopy;  Laterality: N/A;  730  . D & C HYSTEROSCOPY/  RESECTION ENDOMETRIAL MASS/  [place] ENDOMETRIAL ABLATION  04-11-2010  . DX LAPAROSCOPY W/ PARTIAL SMALL BOWEL RESECTION AND APPENDECTOMY  04-13-2007  . ESOPHAGOGASTRODUODENOSCOPY N/A 05/25/2014   Procedure: ESOPHAGOGASTRODUODENOSCOPY (EGD);  Surgeon: Rogene Houston, MD;  [facility];  Service: Endoscopy;  Laterality: N/A;  155  . KNEE ARTHROSCOPY Right 2005  . LAPAROSCOPIC ASSISTED VAGINAL HYSTERECTOMY  10-13-2010   w/ Bx Left Fallopian tube and Aspiration Right Ovarian Cyst  . LAPAROSCOPIC CHOLECYSTECTOMY  11-17-2002  . LAPAROSCOPIC SALPINGO OOPHERECTOMY Bilateral 08/26/2015   Procedure: LAPAROSCOPIC SALPINGO OOPHORECTOMY, bilateral;  Surgeon: Everlene Farrier, MD;  Location: [place];  Service: Gynecology;  Laterality: Bilateral;  . PORTACATH PLACEMENT  05-17-2014  . REDUCTION MAMMAPLASTY    . SHOULDER ARTHROSCOPY WITH ROTATOR CUFF REPAIR Right 2002  . TRANSTHORACIC ECHOCARDIOGRAM  05-18-2014   ef 60-65%//   last MUGA  (08-08-2015)  ef 56.6%      Family History Family History  Problem Relation Age of Onset  . Diabetes Father   . Heart attack Maternal Grandmother 30       multiple over lifetime.  . Cancer Maternal Grandmother 24       NOS  . Prostate cancer Maternal Grandfather        dx in his 53s  . Lung cancer Paternal Grandfather        dx <50  . Lymphoma Maternal Aunt        dx in her 52s  . Melanoma Cousin 97       maternal first cousin  . Brain cancer Cousin        paternal first cousin dx under  95  . Prostate cancer Other        MGF's father  . Colon cancer Other        MGM's mother     Social History  reports that she quit smoking about 23 years ago. Her smoking use included cigarettes. She has never used smokeless tobacco. She reports that she drinks alcohol. She reports that she does not use drugs.  Medications  Current Outpatient Medications:  .  acetaminophen (TYLENOL) 325 MG tablet, Take 650 mg by mouth every 6 (six) hours as needed for moderate pain or headache., Disp: , Rfl:  .  calcium carbonate (TUMS - DOSED IN MG ELEMENTAL CALCIUM) 500 MG chewable tablet, Chew 2 tablets by mouth daily. , Disp: , Rfl:  .  Calcium-Phosphorus-Vitamin D (CALCIUM/D3 ADULT GUMMIES PO), Take 2 tablets by mouth 2 (two) times daily. dosage of calcium 1243m and vitamin d 10084m Disp: , Rfl:  .  cyclobenzaprine (  FLEXERIL) 10 MG tablet, cyclobenzaprine 10 mg tablet  Take 1 tablet(s) q 6-8 hrs as needed for spasm, Disp: , Rfl:  .  desvenlafaxine (PRISTIQ) 100 MG 24 hr tablet, Take 1 tablet (100 mg total) by mouth daily., Disp: 90 tablet, Rfl: 1 .  dicyclomine (BENTYL) 20 MG tablet, Take 1 tablet (20 mg total) by mouth 4 (four) times daily -  before meals and at bedtime., Disp: 40 tablet, Rfl: 0 .  diphenhydrAMINE (BENADRYL) 25 mg capsule, Take 25 mg by mouth at bedtime as needed for itching. , Disp: , Rfl:  .  diphenoxylate-atropine (LOMOTIL) 2.5-0.025 MG tablet, Take 2 tablets by mouth 4 (four) times daily as needed for diarrhea or loose stools., Disp: 60 tablet, Rfl: 1 .  DUREZOL 0.05 % EMUL, INSTILL ONE DROP IN THE RIGHT EYE TWICE DAILY TIME ONE WEEK THEN ONCE DAILY, Disp: , Rfl: 2 .  furosemide (LASIX) 20 MG tablet, TAKE 1 TABLET BY MOUTH DAILY, Disp: 30 tablet, Rfl: 2 .  gabapentin (NEURONTIN) 300 MG capsule, TAKE TWO CAPSULES BY MOUTH AT BEDTIME, Disp: 60 capsule, Rfl: 5 .  HYDROmorphone (DILAUDID) 2 MG tablet, Dilaudid 2 mg tablet  1 q 4-6 hrs prn pain, Disp: , Rfl:  .  lidocaine-prilocaine  (EMLA) cream, Apply 1 application topically daily as needed (apply to port before chemo)., Disp: 30 g, Rfl: 2 .  lidocaine-prilocaine (EMLA) cream, Apply to affected area once, Disp: 30 g, Rfl: 3 .  LORazepam (ATIVAN) 0.5 MG tablet, TAKE 1 TABLET BY MOUTH EVERY 6 HOURS AS NEEDED FOR ANXIETY, Disp: 30 tablet, Rfl: 1 .  Melatonin 10 MG CAPS, Take 1 tablet by mouth at bedtime. Pt states she takes one tablet at night for sleep , Disp: , Rfl:  .  morphine (MS CONTIN) 30 MG 12 hr tablet, TAKE 1 TABLET BY MOUTH EVERY TWELVE HOURS, Disp: 60 tablet, Rfl: 0 .  NARCAN 4 MG/0.1ML LIQD nasal spray kit, CALL 911. ADMINISTER A SINGLE SPRAY OF NARCAN IN ONE NOSTRIL, REPEAT EVERY 3 MINUTES AS NEEDED IF NO OR MINIMAL RESPONSE, Disp: , Rfl: 99 .  ondansetron (ZOFRAN) 4 MG tablet, Zofran 4 mg tablet  1 q 6-8 hrs as needed for post op nausea, Disp: , Rfl:  .  ondansetron (ZOFRAN) 8 MG tablet, TAKE ONE TABLET BY MOUTH EVERY 8 HOURS AS NEEDED FOR NAUSEA AND VOMITING, Disp: , Rfl: 4 .  oxyCODONE (OXY IR/ROXICODONE) 5 MG immediate release tablet, Take 1-2 tablets (5-10 mg total) by mouth every 4 (four) hours as needed for moderate pain., Disp: 60 tablet, Rfl: 0 .  pantoprazole (PROTONIX) 40 MG tablet, Take 1 tablet (40 mg total) by mouth every 12 (twelve) hours., Disp: 60 tablet, Rfl: 3 .  potassium chloride SA (K-DUR,KLOR-CON) 20 MEQ tablet, Take 1 tablet (20 mEq total) by mouth daily., Disp: 30 tablet, Rfl: 3 .  promethazine (PHENERGAN) 25 MG tablet, TAKE 1 TABLET BY MOUTH EVERY SIX HOURS AS NEEDED FOR NAUSEA OR VOMITING, Disp: 30 tablet, Rfl: 6 .  rivaroxaban (XARELTO) 20 MG TABS tablet, TAKE 1 TABLET BY MOUTH EVERY DAY WITH SUPPER, Disp: 30 tablet, Rfl: 3 .  spironolactone (ALDACTONE) 25 MG tablet, Take 1 tablet (25 mg total) by mouth daily., Disp: 30 tablet, Rfl: 3 .  Zoledronic Acid (ZOMETA IV), Inject into the vein every 3 (three) months., Disp: , Rfl:  .  zolpidem (AMBIEN) 10 MG tablet, TAKE 1 TABLET BY MOUTH AT  BEDTIME AS NEEDED FOR SLEEP, Disp: 30 tablet, Rfl:  0 No current facility-administered medications for this visit.   Facility-Administered Medications Ordered in Other Visits:  .  sodium chloride flush (NS) 0.9 % injection 10 mL, 10 mL, Intracatheter, PRN, Lillybeth Tal, MD, 10 mL at 04/15/18 1108  Allergies Patient has no known allergies.  Review of Systems Review of Systems - Oncology ROS negative other than joint pain and constipation.     Physical Exam  Vitals Wt Readings from Last 3 Encounters:  04/15/18 274 lb 11.1 oz (124.6 kg)  03/25/18 270 lb 3.2 oz (122.6 kg)  03/04/18 268 lb 4.8 oz (121.7 kg)   Temp Readings from Last 3 Encounters:  04/15/18 98.8 F (37.1 C) (Oral)  03/25/18 98.3 F (36.8 C) (Oral)  03/25/18 98 F (36.7 C) (Oral)   BP Readings from Last 3 Encounters:  04/15/18 96/72  03/25/18 99/76  03/25/18 125/61   Pulse Readings from Last 3 Encounters:  04/15/18 90  03/25/18 96  03/25/18 66   Constitutional: Well-developed, well-nourished, and in no distress.   HENT: Head: Normocephalic and atraumatic.  Mouth/Throat: No oropharyngeal exudate. Mucosa moist. Eyes: Pupils are equal, round, and reactive to light. Conjunctivae are normal. No scleral icterus.  Neck: Normal range of motion. Neck supple. No JVD present.  Cardiovascular: Normal rate, regular rhythm and normal heart sounds.  Exam reveals no gallop and no friction rub.   No murmur heard. Pulmonary/Chest: Effort normal and breath sounds normal. No respiratory distress. No wheezes.No rales.  Abdominal: Soft. Bowel sounds are normal. No distension. There is no tenderness. There is no guarding.  Musculoskeletal: Left arm in sling due to recent surgery.   Lymphadenopathy: No cervical, axillary or supraclavicular adenopathy.  Neurological: Alert and oriented to person, place, and time. No cranial nerve deficit.  Skin: Skin is warm and dry. No rash noted. No erythema. No pallor.  Psychiatric: Affect and  judgment normal.   Labs Appointment on 04/15/2018  Component Date Value Ref Range Status  . WBC 04/15/2018 6.0  4.0 - 10.5 K/uL Final  . RBC 04/15/2018 3.84* 3.87 - 5.11 MIL/uL Final  . Hemoglobin 04/15/2018 9.0* 12.0 - 15.0 g/dL Final  . HCT 04/15/2018 31.9* 36.0 - 46.0 % Final  . MCV 04/15/2018 83.1  80.0 - 100.0 fL Final  . MCH 04/15/2018 23.4* 26.0 - 34.0 pg Final  . MCHC 04/15/2018 28.2* 30.0 - 36.0 g/dL Final  . RDW 04/15/2018 19.0* 11.5 - 15.5 % Final  . Platelets 04/15/2018 212  150 - 400 K/uL Final  . nRBC 04/15/2018 0.5* 0.0 - 0.2 % Final  . Neutrophils Relative % 04/15/2018 80  % Final  . Neutro Abs 04/15/2018 4.8  1.7 - 7.7 K/uL Final  . Lymphocytes Relative 04/15/2018 11  % Final  . Lymphs Abs 04/15/2018 0.6* 0.7 - 4.0 K/uL Final  . Monocytes Relative 04/15/2018 8  % Final  . Monocytes Absolute 04/15/2018 0.5  0.1 - 1.0 K/uL Final  . Eosinophils Relative 04/15/2018 0  % Final  . Eosinophils Absolute 04/15/2018 0.0  0.0 - 0.5 K/uL Final  . Basophils Relative 04/15/2018 0  % Final  . Basophils Absolute 04/15/2018 0.0  0.0 - 0.1 K/uL Final  . Immature Granulocytes 04/15/2018 1  % Final  . Abs Immature Granulocytes 04/15/2018 0.06  0.00 - 0.07 K/uL Final   Performed at [facility], [address] [place], [place] [address]  . Sodium 04/15/2018 137  135 - 145 mmol/L Final  . Potassium 04/15/2018 3.4* 3.5 - 5.1 mmol/L Final  .  Chloride 04/15/2018 100  98 - 111 mmol/L Final  . CO2 04/15/2018 29  22 - 32 mmol/L Final  . Glucose, Bld 04/15/2018 115* 70 - 99 mg/dL Final  . BUN 04/15/2018 8  6 - 20 mg/dL Final  . Creatinine, Ser 04/15/2018 0.74  0.44 - 1.00 mg/dL Final  . Calcium 04/15/2018 8.7* 8.9 - 10.3 mg/dL Final  . Total Protein 04/15/2018 6.8  6.5 - 8.1 g/dL Final  . Albumin 04/15/2018 3.3* 3.5 - 5.0 g/dL Final  . AST 04/15/2018 34  15 - 41 U/L Final  . ALT 04/15/2018 19  0 - 44 U/L Final  . Alkaline Phosphatase 04/15/2018 109  38 - 126 U/L Final  . Total Bilirubin  04/15/2018 0.2* 0.3 - 1.2 mg/dL Final  . GFR calc non Af Amer 04/15/2018 >60  >60 mL/min Final  . GFR calc Af Amer 04/15/2018 >60  >60 mL/min Final  . Anion gap 04/15/2018 8  5 - 15 Final   Performed at [facility], [address] [place], [place] [address]  . LDH 04/15/2018 182  98 - 192 U/L Final   Performed at [facility], [address], [place], [place] [address]  . Magnesium 04/15/2018 2.3  1.7 - 2.4 mg/dL Final   Performed at [facility], [address] [place], [place] [address]     Pathology Orders Placed This Encounter  Procedures  . CBC with Differential/Platelet    Standing Status:   Future    Standing Expiration Date:   04/16/2019  . Comprehensive metabolic panel    Standing Status:   Future    Standing Expiration Date:   04/16/2019  . Lactate dehydrogenase    Standing Status:   Future    Standing Expiration Date:   04/16/2019  . CBC with Differential/Platelet    Standing Status:   Future    Standing Expiration Date:   04/15/2020  . Comprehensive metabolic panel    Standing Status:   Future    Standing Expiration Date:   04/15/2020  . Lactate dehydrogenase    Standing Status:   Future    Standing Expiration Date:   04/15/2020  . Magnesium    Standing Status:   Future    Standing Expiration Date:   04/16/2019  . Magnesium    Standing Status:   Future    Standing Expiration Date:   04/15/2020       Zoila Shutter MD

## 2018-04-21 DIAGNOSIS — M25512 Pain in left shoulder: Secondary | ICD-10-CM | POA: Diagnosis not present

## 2018-04-21 DIAGNOSIS — M25612 Stiffness of left shoulder, not elsewhere classified: Secondary | ICD-10-CM | POA: Diagnosis not present

## 2018-04-22 ENCOUNTER — Other Ambulatory Visit (HOSPITAL_COMMUNITY): Payer: Self-pay | Admitting: *Deleted

## 2018-04-22 ENCOUNTER — Inpatient Hospital Stay (HOSPITAL_COMMUNITY): Payer: Medicare Other

## 2018-04-22 ENCOUNTER — Other Ambulatory Visit (HOSPITAL_COMMUNITY): Payer: Self-pay | Admitting: Internal Medicine

## 2018-04-22 ENCOUNTER — Telehealth (HOSPITAL_COMMUNITY): Payer: Self-pay | Admitting: *Deleted

## 2018-04-22 ENCOUNTER — Other Ambulatory Visit (HOSPITAL_COMMUNITY): Payer: Self-pay | Admitting: Nurse Practitioner

## 2018-04-22 ENCOUNTER — Encounter (HOSPITAL_COMMUNITY): Payer: Self-pay | Admitting: *Deleted

## 2018-04-22 DIAGNOSIS — C7931 Secondary malignant neoplasm of brain: Secondary | ICD-10-CM | POA: Diagnosis not present

## 2018-04-22 DIAGNOSIS — C50919 Malignant neoplasm of unspecified site of unspecified female breast: Secondary | ICD-10-CM

## 2018-04-22 DIAGNOSIS — G4700 Insomnia, unspecified: Secondary | ICD-10-CM

## 2018-04-22 DIAGNOSIS — Z5112 Encounter for antineoplastic immunotherapy: Secondary | ICD-10-CM | POA: Diagnosis not present

## 2018-04-22 DIAGNOSIS — C7951 Secondary malignant neoplasm of bone: Secondary | ICD-10-CM | POA: Diagnosis not present

## 2018-04-22 DIAGNOSIS — R600 Localized edema: Secondary | ICD-10-CM

## 2018-04-22 DIAGNOSIS — Z170 Estrogen receptor positive status [ER+]: Secondary | ICD-10-CM | POA: Diagnosis not present

## 2018-04-22 DIAGNOSIS — E876 Hypokalemia: Secondary | ICD-10-CM

## 2018-04-22 DIAGNOSIS — C787 Secondary malignant neoplasm of liver and intrahepatic bile duct: Secondary | ICD-10-CM | POA: Diagnosis not present

## 2018-04-22 LAB — COMPREHENSIVE METABOLIC PANEL
ALT: 38 U/L (ref 0–44)
AST: 82 U/L — ABNORMAL HIGH (ref 15–41)
Albumin: 3.2 g/dL — ABNORMAL LOW (ref 3.5–5.0)
Alkaline Phosphatase: 144 U/L — ABNORMAL HIGH (ref 38–126)
Anion gap: 10 (ref 5–15)
BUN: 5 mg/dL — ABNORMAL LOW (ref 6–20)
CO2: 27 mmol/L (ref 22–32)
Calcium: 8.5 mg/dL — ABNORMAL LOW (ref 8.9–10.3)
Chloride: 97 mmol/L — ABNORMAL LOW (ref 98–111)
Creatinine, Ser: 0.74 mg/dL (ref 0.44–1.00)
GFR calc Af Amer: >60 mL/min (ref >60)
GFR calc non Af Amer: >60 mL/min (ref >60)
Glucose, Bld: 168 mg/dL — ABNORMAL HIGH (ref 70–99)
POTASSIUM: 2.9 mmol/L — AB (ref 3.5–5.1)
Sodium: 134 mmol/L — ABNORMAL LOW (ref 135–145)
Total Bilirubin: 0.6 mg/dL (ref 0.3–1.2)
Total Protein: 6.8 g/dL (ref 6.5–8.1)

## 2018-04-22 LAB — CBC WITH DIFFERENTIAL/PLATELET
Abs Immature Granulocytes: 0.08 10*3/uL — ABNORMAL HIGH (ref 0.00–0.07)
Basophils Absolute: 0 10*3/uL (ref 0.0–0.1)
Basophils Relative: 0 %
Eosinophils Absolute: 0 10*3/uL (ref 0.0–0.5)
Eosinophils Relative: 0 %
HCT: 30.4 % — ABNORMAL LOW (ref 36.0–46.0)
Hemoglobin: 9 g/dL — ABNORMAL LOW (ref 12.0–15.0)
Immature Granulocytes: 2 %
Lymphocytes Relative: 9 %
Lymphs Abs: 0.5 10*3/uL — ABNORMAL LOW (ref 0.7–4.0)
MCH: 24 pg — ABNORMAL LOW (ref 26.0–34.0)
MCHC: 29.6 g/dL — ABNORMAL LOW (ref 30.0–36.0)
MCV: 81.1 fL (ref 80.0–100.0)
Monocytes Absolute: 0.5 10*3/uL (ref 0.1–1.0)
Monocytes Relative: 10 %
Neutro Abs: 4.3 10*3/uL (ref 1.7–7.7)
Neutrophils Relative %: 79 %
Platelets: 98 10*3/uL — ABNORMAL LOW (ref 150–400)
RBC: 3.75 MIL/uL — ABNORMAL LOW (ref 3.87–5.11)
RDW: 19.3 % — AB (ref 11.5–15.5)
WBC: 5.4 10*3/uL (ref 4.0–10.5)
nRBC: 0.7 % — ABNORMAL HIGH (ref 0.0–0.2)

## 2018-04-22 LAB — URINALYSIS, ROUTINE W REFLEX MICROSCOPIC
Bilirubin Urine: NEGATIVE
Glucose, UA: 100 mg/dL — AB
Hgb urine dipstick: NEGATIVE
Ketones, ur: NEGATIVE mg/dL
Nitrite: POSITIVE — AB
Protein, ur: 30 mg/dL — AB
Specific Gravity, Urine: 1.01 (ref 1.005–1.030)
pH: 6 (ref 5.0–8.0)

## 2018-04-22 LAB — URINALYSIS, MICROSCOPIC (REFLEX)

## 2018-04-22 LAB — MAGNESIUM: MAGNESIUM: 2.1 mg/dL (ref 1.7–2.4)

## 2018-04-22 LAB — LACTATE DEHYDROGENASE: LDH: 326 U/L — ABNORMAL HIGH (ref 98–192)

## 2018-04-22 MED ORDER — POTASSIUM CHLORIDE CRYS ER 20 MEQ PO TBCR: 40.0000 meq | EXTENDED_RELEASE_TABLET | Freq: Every day | ORAL | 3 refills | Status: AC

## 2018-04-22 MED ORDER — CIPROFLOXACIN HCL 500 MG PO TABS: 500.0000 mg | ORAL_TABLET | Freq: Two times a day (BID) | ORAL | 0 refills | Status: DC

## 2018-04-22 NOTE — Progress Notes (Signed)
Can you notify pt Potassium level is low and potassium sent to pharmacy

## 2018-04-22 NOTE — Telephone Encounter (Signed)
Message left on [name]'s cell phone letting him know that [place] needed to take Potassium 26meq today in addition to her 76meq that she should have already taken this morning. Potassium 2.9. Patient to return to 105meq per day starting tomorrow 12/14 per Dr. Raliegh Ip.

## 2018-04-22 NOTE — Progress Notes (Signed)
I spoke with patient's husband again and advised that if [name] should become worse over the weekend, he should take her to the emergency room.  He verbalizes understanding.

## 2018-04-22 NOTE — Progress Notes (Signed)
Patient's husband called clinic complaining of [name] has been forgetful, inappropriate conversations, not in her right frame of mind.  He denies any recent changes to medications.  Patient denies any urinary symptoms.  I spoke with Dr. Delton Coombes and received orders to have patient come to lab today and then follow up early next week.  Patient and spouse are aware and verbalize understanding.

## 2018-04-22 NOTE — Progress Notes (Signed)
Pt's antibiotic called into preferred pharmacy. Pt's husband aware that medication has been sent in for the pt and that we should have the culture by Monday. Pt's husband verbalized understanding

## 2018-04-23 LAB — CANCER ANTIGEN 27.29: CA 27.29: 73.1 U/mL — ABNORMAL HIGH (ref 0.0–38.6)

## 2018-04-23 LAB — URINE CULTURE: Culture: <10000 — AB

## 2018-04-23 LAB — CANCER ANTIGEN 15-3: CA 15-3: 56.6 U/mL — ABNORMAL HIGH (ref 0.0–25.0)

## 2018-04-25 ENCOUNTER — Ambulatory Visit (HOSPITAL_COMMUNITY)
Admission: RE | Admit: 2018-04-25 | Discharge: 2018-04-25 | Disposition: A | Discharge status: Home / Self Care | Payer: Medicare Other | Source: Ambulatory Visit | Attending: Internal Medicine | Admitting: Internal Medicine

## 2018-04-25 ENCOUNTER — Encounter (HOSPITAL_COMMUNITY): Payer: Self-pay | Admitting: Internal Medicine

## 2018-04-25 ENCOUNTER — Inpatient Hospital Stay (HOSPITAL_COMMUNITY): Payer: Medicare Other

## 2018-04-25 ENCOUNTER — Inpatient Hospital Stay (HOSPITAL_BASED_OUTPATIENT_CLINIC_OR_DEPARTMENT_OTHER): Payer: Medicare Other | Admitting: Internal Medicine

## 2018-04-25 ENCOUNTER — Other Ambulatory Visit: Payer: Self-pay

## 2018-04-25 DIAGNOSIS — R51 Headache: Secondary | ICD-10-CM | POA: Diagnosis not present

## 2018-04-25 DIAGNOSIS — E876 Hypokalemia: Secondary | ICD-10-CM | POA: Diagnosis not present

## 2018-04-25 DIAGNOSIS — Z170 Estrogen receptor positive status [ER+]: Secondary | ICD-10-CM | POA: Diagnosis not present

## 2018-04-25 DIAGNOSIS — C50919 Malignant neoplasm of unspecified site of unspecified female breast: Secondary | ICD-10-CM

## 2018-04-25 DIAGNOSIS — Z86718 Personal history of other venous thrombosis and embolism: Secondary | ICD-10-CM

## 2018-04-25 DIAGNOSIS — G894 Chronic pain syndrome: Secondary | ICD-10-CM

## 2018-04-25 DIAGNOSIS — C787 Secondary malignant neoplasm of liver and intrahepatic bile duct: Secondary | ICD-10-CM | POA: Diagnosis not present

## 2018-04-25 DIAGNOSIS — C7951 Secondary malignant neoplasm of bone: Secondary | ICD-10-CM | POA: Diagnosis not present

## 2018-04-25 DIAGNOSIS — Z79899 Other long term (current) drug therapy: Secondary | ICD-10-CM

## 2018-04-25 DIAGNOSIS — Z801 Family history of malignant neoplasm of trachea, bronchus and lung: Secondary | ICD-10-CM

## 2018-04-25 DIAGNOSIS — R978 Other abnormal tumor markers: Secondary | ICD-10-CM

## 2018-04-25 DIAGNOSIS — K219 Gastro-esophageal reflux disease without esophagitis: Secondary | ICD-10-CM

## 2018-04-25 DIAGNOSIS — Z806 Family history of leukemia: Secondary | ICD-10-CM

## 2018-04-25 DIAGNOSIS — Z86010 Personal history of colonic polyps: Secondary | ICD-10-CM

## 2018-04-25 DIAGNOSIS — G4489 Other headache syndrome: Secondary | ICD-10-CM | POA: Diagnosis not present

## 2018-04-25 DIAGNOSIS — Z9221 Personal history of antineoplastic chemotherapy: Secondary | ICD-10-CM

## 2018-04-25 DIAGNOSIS — C7931 Secondary malignant neoplasm of brain: Secondary | ICD-10-CM | POA: Diagnosis not present

## 2018-04-25 DIAGNOSIS — R7989 Other specified abnormal findings of blood chemistry: Secondary | ICD-10-CM | POA: Diagnosis not present

## 2018-04-25 DIAGNOSIS — Z90722 Acquired absence of ovaries, bilateral: Secondary | ICD-10-CM

## 2018-04-25 DIAGNOSIS — Z90710 Acquired absence of both cervix and uterus: Secondary | ICD-10-CM

## 2018-04-25 DIAGNOSIS — F329 Major depressive disorder, single episode, unspecified: Secondary | ICD-10-CM

## 2018-04-25 DIAGNOSIS — G893 Neoplasm related pain (acute) (chronic): Secondary | ICD-10-CM | POA: Diagnosis not present

## 2018-04-25 DIAGNOSIS — Z5112 Encounter for antineoplastic immunotherapy: Secondary | ICD-10-CM | POA: Diagnosis not present

## 2018-04-25 DIAGNOSIS — C229 Malignant neoplasm of liver, not specified as primary or secondary: Secondary | ICD-10-CM

## 2018-04-25 DIAGNOSIS — C801 Malignant (primary) neoplasm, unspecified: Secondary | ICD-10-CM | POA: Diagnosis not present

## 2018-04-25 DIAGNOSIS — D649 Anemia, unspecified: Secondary | ICD-10-CM

## 2018-04-25 DIAGNOSIS — Z923 Personal history of irradiation: Secondary | ICD-10-CM

## 2018-04-25 DIAGNOSIS — R5382 Chronic fatigue, unspecified: Secondary | ICD-10-CM

## 2018-04-25 DIAGNOSIS — Z0000 Encounter for general adult medical examination without abnormal findings: Secondary | ICD-10-CM

## 2018-04-25 DIAGNOSIS — Z8042 Family history of malignant neoplasm of prostate: Secondary | ICD-10-CM

## 2018-04-25 DIAGNOSIS — Z87891 Personal history of nicotine dependence: Secondary | ICD-10-CM

## 2018-04-25 MED ORDER — POTASSIUM CHLORIDE 10 MEQ/100ML IV SOLN
10.0000 meq | INTRAVENOUS | Status: AC
  Administered 2018-04-25 (×2): 10 meq via INTRAVENOUS
  Filled 2018-04-25 (×2): qty 100

## 2018-04-25 MED ORDER — IOHEXOL 300 MG/ML  SOLN
75.0000 mL | Freq: Once | INTRAMUSCULAR | Status: AC | PRN
  Administered 2018-04-25: 75 mL via INTRAVENOUS

## 2018-04-25 MED ORDER — SODIUM CHLORIDE 0.9 % IV SOLN
INTRAVENOUS | Status: DC
  Administered 2018-04-25: 13:00:00 via INTRAVENOUS

## 2018-04-25 MED ORDER — SODIUM CHLORIDE 0.9% FLUSH
10.0000 mL | Freq: Once | INTRAVENOUS | Status: AC
  Administered 2018-04-25: 10 mL via INTRAVENOUS

## 2018-04-25 MED ORDER — MORPHINE SULFATE ER 30 MG PO TBCR: EXTENDED_RELEASE_TABLET | ORAL | 0 refills | Status: DC

## 2018-04-25 MED ORDER — HEPARIN SOD (PORK) LOCK FLUSH 100 UNIT/ML IV SOLN
500.0000 [IU] | Freq: Once | INTRAVENOUS | Status: AC
  Administered 2018-04-25: 500 [IU] via INTRAVENOUS
  Filled 2018-04-25: qty 5

## 2018-04-25 NOTE — Progress Notes (Signed)
Diagnosis Other headache syndrome - Plan: CT Head W Wo Contrast  Staging Cancer Staging Breast cancer, stage 4 ([place]) Staging form: Breast, AJCC 7th Edition - Clinical stage from 05/27/2014: Stage IV (T0, N0, M1) - Signed by Baird Cancer, PA-C on 05/27/2014   Assessment and Plan:   1.  52 yr old female with Stage IV adenocarcinoma of breast, ER+, HER2 + with metastatic disease to brain and bone. She is S/P multiple lines of therapy. Primary tumor never identified. She is S/P BSO by Dr. Gaetano Net on 08/27/2015. Pt was treated with Herceptin + Tykerb beginning on 07/23/2017 and she continues on bone targeted therapy with Zometa  to reduce SRE and its antitumor effect in bone. Restaging PET scan on 10/18/2017 demonstrated diffuse sclerotic bone metastases with mild improvement noted at several sites without any new or progressive metastatic disease or evidence of soft tissue metastatic disease. Restaging MRI brain on 10/27/2017 was negative for intracranial disease.  ECHO done 01/17/2018 shows EF 60-65%.      PET scan done 02/21/2018 reviewed and showed   IMPRESSION: 1. Multifocal hypermetabolic sclerotic bone metastases are identified. Some of these lesions appear increased in size in the interval including the right scapula and T10 vertebra. New abnormal uptake is identified within the T11 vertebra. Other lesions exhibit similar degrees of hypermetabolism. 2. No hypermetabolic nodal or solid organ metastases  MRI of brain done 02/23/2018 reviewed and showed  IMPRESSION: Stable exam. Mild atrophy and small vessel disease. No acute intracranial abnormality.  Satisfactory post treatment changes. No new or enhancing lesions are Identified.  Recent imaging with PET scan and bone scans appears consistent with progression with new T11 lesion and increase in size of T10 and right scapula lesion.    Bone scan done 03/02/2018 reviewed and showed  FINDINGS: Multiple sites of abnormal osseous  tracer accumulation consistent with osseous metastatic disease, including RIGHT glenoid, RIGHT humeral head, anterior RIGHT sixth rib, BILATERAL posterior ribs, T7 and T10 vertebral bodies, sacrum, anterior LEFT iliac bone, RIGHT SI joint, and RIGHT femoral head/neck junction.  Tumor markers have increased from CA 15-3 of 70 and 27.29 of 80 in 11/2017 to CA 15-3 of 102 and CA 27.29 of 110 on labs done 02/24/2018.    Pt underwent MRI of thoracic and lumbar spine for further evaluation of low back pain and T10 and T 11 lesions.   MRI done 03/11/2018 reviewed and showed  IMPRESSION: 1. Known multifocal osseous metastatic disease most notable at T10 where there is left preferential epidural tumor extension causing left foraminal impingement at T10-11. No cord mass effect. 2. No pathologic fracture.  For patients who progress after initial herceptin and a taxane in the metastatic setting, or after both trastuzumab- and lapatinib-containing regimens, T-DM1 is an active agent, provided they have not received it previously.  Review of chart shows no piror treatment and pt denies previous therapy with Kadcyla.    Recommendation was based on results of EMILIA trial and a second phase III trial, called [id number].  Pts on this trial noted improvement in PFS and OS.    Treatment with T-DM1 was not associated with an increased incidence of serious (grade 3/4) adverse events. Based on these data, T-DM1 should be administered in patients who progress on prior HER2-directed therapies. Retrospective data suggest activity of T-DM1 among those treated with prior Pertuzumab suggesting a clinical benefit even in the setting of prior pertuzumab use.  Side effects of the medication previously reviewed with the pt and  she was provided written information.  She is advised to discontinue Lapatinib.  Pt was also given option of referral  to [facility] center for clinical trial consideration.  She will also receive Zometa  for bone strengthening every 3 weeks with Kadcyla. which will be dosed at 3.6 mg/kg every 3 weeks.    PET scan reports a new area at T11.  No cord compression reported.  She also has reported increase in lesion in right scapula and T10 vertebra.  Other lesions are stable and there is no evidence of solid organ mets.    Pt was seen by RT for evaluation in [place] as she has been seen there before.  She has completed RT and feels no improvement in pain score.   Labs done 04/22/2018 reviewed and showed WBC 5.4 HB 9 plts 98,000. Chemistries WNl with Cr 0.74. Tumor markers improved with CA 15-3 of 57 and 27.29 of 73.  Will repeat labs in 05/2018.   2.  Recent fall.  Pt recommended for CT of brain which was done 04/25/2018 that showed  IMPRESSION: 1. Stable from brain MRI 02/23/2018. 2. No evidence of recurrent intracranial metastasis. 3. Subcentimeter bone metastasis in the clivus.  Pt advised to avoid Dilaudid as she is on MS Contin to determine if some of the symptoms improve.  RX for MS contin sent to pharmacy  3.  Headache/Brain Mets.  Pt reports this is improved.   MRI of brain done 10/2017 was negative.  She reports history of migraine  Follow-up with PCP or neurology for management.  She is on morphine.  MRI of brain for ongoing follow-up done 02/23/2018 shows IMPRESSION: Stable exam. Mild atrophy and small vessel disease. No acute intracranial abnormality.  Satisfactory post treatment changes. No new or enhancing lesions are Identified  Pt will have repeat imaging in 3 months.  CT of brain done today 04/25/2018 shows stable findings.    4.  Bone mets.  Zometa every 3-4 weeks.   5.  Hypokalemia.  K+ level decreased at 2.9 on labs done 04/22/2018.  Pt on po potassium.  Will given 20 meq IV today in clinic.  Will repeat chemistries in 1-2 weeks.  Continue potassium supplementation.    6  Mild abdominal pain/constipation/diarrhea.  She reports this is not new.  Tums prn.  Recent imaging  showed adenopathy in abdomen.  Pt advised to use stool regimen due to narcotic use.  Pt will receive IVF today in clinic.    7.  Left arm injury.  Pt has undergone surgery and has arm in sling.  PT as directed.    8.  Anemia/thrombocytopenia.  Likely due to recent chemotherapy/RT.Marland Kitchen  Will repeat labs in 1-2 weeks.   9.  Elevated LFTs.  AST mildly increased at 82.  Will repeat labs in 1-2 weeks due to recent chemotherapy.   25  minutes spent with more than 50% spent in counseling and coordination of care.    Interval History:  Historical data obtained from the note dated 10/29/2017.  52 yr old female with Stage IV adenocarcinoma of breast, ER+, HER2 + with metastatic disease to brain and bone. She is S/P multiple lines of therapy. Primary tumor never identified. She is S/P BSO by Dr. Gaetano Net on 08/27/2015. Current therapy is Herceptin + Tykerb beginning on 07/23/2017 and she continues on bone targeted therapy with Zometa every 3 months to reduce SRE and its antitumor effect in bone. Restaging PET scan on 10/18/2017 demonstrated diffuse sclerotic bone metastases  with mild improvement noted at several sites without any new or progressive metastatic disease or evidence of soft tissue metastatic disease. Restaging MRI brain on 10/27/2017 was negative for intracranial disease.  Current Status:  Pt is seen today for follow-up.  Pt called in complaining of worsening headache and had a fall.  She reports she was given Dilaudid after recent surgery and last took it Friday.     Breast cancer, stage 4 ([place])   04/25/2014 Initial Diagnosis    Breast cancer, stage 4    04/25/2014 Imaging    CT abdomen/pelvis with widespread metastatic disease to the liver, multiple lytic lesions throughout spine and pelvis. No FX or epidural tumor identified    04/26/2014 Imaging    CT head unremarkable    04/26/2014 Imaging    CT chest with no lung mass or pulmonary nodules, no adenopathy. Lytic bone lesions, right 2nd rib     04/27/2014 Initial Biopsy    U/S guided liver biopsy, lesion in anterior and inferior left hepatic lobe biopsied    04/27/2014 Pathology Results    Metastatic adenocarcinoma, CK7, ER+, patchy positivity with PR. Possible primary includes breast, less likely gynecologic    05/15/2014 Mammogram    BI-RADS CATEGORY  2: Benign Finding(s)    05/16/2014 PET scan    1. Intensely hypermetabolic hepatic metastasis. 2. Widespread hypermetabolic skeletal lesions. 3. No primary adenocarcinoma identified by FDG PET imaging.    05/19/2014 Imaging    MUGA- Left ventricular ejection fracture greater than 70%.    05/21/2014 Breast MRI    No suspicious masses or enhancement within the breasts. No axillary adenopathy.    05/22/2014 - 07/03/2014 Antibody Plan    Herceptin/Perjeta/Tamoxifen    06/12/2014 - 07/03/2014 Chemotherapy    Taxotere added secondary to persistent abdominal and back pain    06/17/2014 - 06/19/2014 Hospital Admission    Neutropenia, fever, diarrhea, nausea, vomiting    06/20/2014 - 07/10/2014 Radiation Therapy    Dr. Thea Silversmith 12 fractions to L3-S3 (30 Gy) and left scapula (20 Gy).     07/03/2014 Adverse Reaction    Perjeta- induced diarrhea.  Perjeta discontinued    07/16/2014 - 07/20/2014 Hospital Admission    Electrolyte abnormalities, and diarrhea.  Suspect Perjeta-induced diarrhea.  Negative GI work-up.    07/24/2014 - 08/19/2015 Chemotherapy    Herceptin/Tamoxifen/Xgeva    08/21/2014 Imaging    MUGA- Left ventricular ejection fraction equals 71%.    08/24/2014 PET scan    Dramatic reduction in metabolic activity of the widespread liver metastasis. Liver metastasis now have metabolic activity equal to background normal liver activity. Liver has a nodular contour. Marked reduction in metabolic activity of skeletal lesions..    10/05/2014 Progression    Widespread metastatic disease to the brain as described. Between 20 and 30 intracranial metastatic deposits are now seen. No midline  shift or incipient herniation    10/09/2014 - 10/26/2014 Radiation Therapy    Whole Brain XRT    11/14/2014 Imaging    MUGA- LVEF 67%    02/13/2015 Imaging    MUGA- LVEF 59%    02/15/2015 Treatment Plan Change    Due to declining LVEF, will hold Herceptin per PI guidelines.    04/12/2015 -  Chemotherapy    Herceptin restarted    06/02/2015 - 06/08/2015 Hospital Admission    Pneumonia    07/05/2015 Progression     PET/CT concern for mild progression of skeletal metastasis with several lesions within the spine and 1 lesion  in the Left iliac wing with mild to moderate metabolic activity new from prior. Rising CA 27-29    07/16/2015 - 10/23/2015 Anti-estrogen oral therapy    Arimidex    07/16/2015 Imaging    MRI brain with satisfactory post treatment apperance of brain. interval resolved enhancing R caudate metastasis, minimal punctate residual enhancing metastatic disease at the inferior L cerebellum. No new metastatic disease or new intracranial abnormality    07/19/2015 Treatment Plan Change    Discontinue Tamoxifen, Zoladex plus Arimidex.     08/27/2015 Procedure    Laparoscopic bilateral salpingo-oophorectomy by Dr. Gaetano Net    10/17/2015 PET scan    Osseous metastatic disease appears slightly progressive based on a new right scapular lesion and increased uptake within lesions in the thoracic spine, left iliac wing and  proximal right femur.    10/17/2015 Progression    Slight progression on PET scan imaging.    10/18/2015 Imaging    REsolved enhancing metastatic disease to the brain status post WBXRT    10/23/2015 - 02/12/2016 Adjuvant Chemotherapy    Faslodex loading followed by maintenance dose.  (Herceptin continued)    11/04/2015 Imaging    MUGA- LEFT ventricular ejection fraction 51% slightly decreased in a 57% on the previous exam.    12/24/2015 Treatment Plan Change    Zometa every 28 days.  Xgeva discontinued.      01/01/2016 Imaging    MUGA- Left ventricular ejection fraction  equals 57.9%. This is increased from 51.1% previously.    02/03/2016 PET scan    1. Mixed metabolic changes in the scattered hypermetabolic sclerotic osseous metastases throughout the axial and proximal appendicular skeleton as detailed above. 2. No new sites of hypermetabolic metastatic disease. Stable pseudo-cirrhotic appearance of the liver due to treated liver metastases with no hypermetabolic liver metastases.     02/03/2016 Progression    PET shows mixed osseous response.  Some lesions more hypermetabolic, others improved.    02/05/2016 Treatment Plan Change    D/C Herceptin.  Faslodex as scheduled on 02/12/2016, then discontinued.  Continue Zometa.    02/05/2016 Treatment Plan Change    Prescriptions for Xeloda 7 days on and 7 days off and Tykerb printed and provided for authorization.    02/19/2016 -  Chemotherapy    Xeloda 2300 mg BID 7 days on and 7 days off and Tykerb     02/24/2016 Treatment Plan Change    Xeloda dose reduced by 10% to 2000 mg BID week on and week off.    03/18/2016 Treatment Plan Change    Xeloda dose reduced to 2000 mg in AM and 1500 mg in PM 7 days on and 7 days off.    03/23/2016 Imaging    MUGA- Normal LEFT ventricular ejection fraction of 56% not significantly changed from 58% on previous exam.    05/19/2016 Imaging    MRI brain- . No new focus of abnormal enhancement to suggest interval metastatic disease. 2. No acute intracranial abnormality. 3. Stable foci of T2 FLAIR hyperintensity in white matter and in the right caudate head compatible with posttreatment changes and treated metastasis.    05/20/2016 PET scan    1. The multiple osseous metastatic lesions shown to be hypermetabolic on the prior exam are reduced in activity or even resolved in hypermetabolic activity compared to prior, as detailed above. There are also numerous sclerotic bony lesions wedge are not currently and were not previously hypermetabolic, compatible with old non  active lesions. 2. No findings of  extra osseous metastatic disease currently. 3. Hepatic pseudocirrhosis. 4. Healing rib fractures. Chronic pathologic fracture the left acromion.    07/08/2016 Imaging    MUGA- Low normal LEFT ventricular ejection fraction of 53%, little changed since the 56% on 03/23/2016 but slightly decreased from the 58% on 01/01/2016.    09/07/2016 Echocardiogram    MUGA- Normal LEFT ventricular ejection fraction of 59% with normal LV wall motion.    09/07/2016 Treatment Plan Change    Xeloda dose reduced to 1500 mg BID 7 days on and 7 days off.    09/15/2016 PET scan    No significant change in diffuse sclerotic bone metastases on CT images, although several show mildly increased FDG uptake since prior study.  No evidence of soft tissue metastatic disease.    11/02/2016 Imaging    MRI brain- No change from the prior study. Previously noted metastatic deposits have been treated. No new lesions identified.    07/23/2017 -  Chemotherapy    Herceptin + Tykerb     Metastatic breast cancer ([place])   08/26/2015 Initial Diagnosis    Metastatic breast cancer ([place])    03/03/2018 -  Chemotherapy    The patient had ado-trastuzumab emtansine (KADCYLA) 420 mg in sodium chloride 0.9 % 250 mL chemo infusion, 3.4 mg/kg = 440 mg, Intravenous, Once, 3 of 5 cycles Administration: 420 mg (03/04/2018), 420 mg (03/25/2018), 420 mg (04/15/2018)  for chemotherapy treatment.       Problem List Patient Active Problem List   Diagnosis Date Noted  . Mouth sores [K13.79] 03/18/2016  . Esophageal reflux [K21.9]   . Metastatic breast cancer ([place]) [C50.919]   . HCAP (healthcare-associated pneumonia) [J18.9] 06/02/2015  . Pneumonia [J18.9] 06/02/2015  . 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 Brain metastases ([place]) [C79.31] 01/29/2015  . Genetic testing [Z13.79] 09/05/2014  . Family history of prostate cancer [Z80.42]   . GERD (gastroesophageal reflux disease)  [K21.9] 07/16/2014  . DVT (deep venous thrombosis) ([place]) [I82.409] 07/09/2014  . Bone metastases ([place]) [C79.51] 05/23/2014  . Breast cancer, stage 4 ([place]) [C50.919] 05/16/2014  . Hyperlipidemia [E78.5] 04/23/2014  . Depression [F32.9] 04/23/2014    Past Medical History Past Medical History:  Diagnosis Date  . Anticoagulated    xarelto  . Anxiety   . Breast cancer metastasized to multiple sites [facility])    liver, brain, and bone  . Breast cancer, stage 4 [facility]) oncologist-  dr Larene [place] ([facility])   dx 12/ 2015 -- breast cancer Stage 4,  ER/HER2 +,  w/  liver, brain and  bone mets/  chemotherapy and radiation therapy  . Chronic pain syndrome    secondary to cancer   . Depression   . Drug-induced cardiomyopathy ([place])    per last MUGA (08-08-2015), ef 56.5/ per last echo 05-18-2014 ef 60-65%  . Family history of prostate cancer   . GERD (gastroesophageal reflux disease)   . History of colon polyps    07-13-2013  benign  . History of DVT (deep vein thrombosis)    07-09-2014  upper right extremity-  RIJ and right subclavian--  resolved  . History of gastritis    erosive  . History of pneumonia    HCAP 06-07-2015--  resolved per cxr 07-04-2015  . History of radiation therapy    12 fractions to L3 - S3, 30Gy and left spacula 20Gy (06-20-2014 to 07-10-2014) //  whole brain rxt (10-09-2014 to 10-26-2014)  . History of small bowel obstruction    S/P  RESECTION 2008  . Migraine   . PONV (postoperative nausea and vomiting)    pt states scope patch does well    Past Surgical History Past Surgical History:  Procedure Laterality Date  . BREAST REDUCTION SURGERY  03/17/2011   Procedure: MAMMARY REDUCTION BILATERAL (BREAST);  Surgeon: Mary A Contogiannis;  Location: [place];  Service: Plastics;  Laterality: Bilateral;  . CATARACT EXTRACTION W/ INTRAOCULAR LENS  IMPLANT, BILATERAL  2008  . CERVICAL FUSION  2003   C5 -- C6  . COLONOSCOPY N/A 07/13/2013    Procedure: COLONOSCOPY;  Surgeon: Rogene Houston, MD;  [facility];  Service: Endoscopy;  Laterality: N/A;  930  . COLONOSCOPY N/A 11/26/2014   Procedure: COLONOSCOPY;  Surgeon: Rogene Houston, MD;  [facility];  Service: Endoscopy;  Laterality: N/A;  730  . D & C HYSTEROSCOPY/  RESECTION ENDOMETRIAL MASS/  [place] ENDOMETRIAL ABLATION  04-11-2010  . DX LAPAROSCOPY W/ PARTIAL SMALL BOWEL RESECTION AND APPENDECTOMY  04-13-2007  . ESOPHAGOGASTRODUODENOSCOPY N/A 05/25/2014   Procedure: ESOPHAGOGASTRODUODENOSCOPY (EGD);  Surgeon: Rogene Houston, MD;  [facility];  Service: Endoscopy;  Laterality: N/A;  155  . KNEE ARTHROSCOPY Right 2005  . LAPAROSCOPIC ASSISTED VAGINAL HYSTERECTOMY  10-13-2010   w/ Bx Left Fallopian tube and Aspiration Right Ovarian Cyst  . LAPAROSCOPIC CHOLECYSTECTOMY  11-17-2002  . LAPAROSCOPIC SALPINGO OOPHERECTOMY Bilateral 08/26/2015   Procedure: LAPAROSCOPIC SALPINGO OOPHORECTOMY, bilateral;  Surgeon: Everlene Farrier, MD;  Location: [place];  Service: Gynecology;  Laterality: Bilateral;  . PORTACATH PLACEMENT  05-17-2014  . REDUCTION MAMMAPLASTY    . SHOULDER ARTHROSCOPY WITH ROTATOR CUFF REPAIR Right 2002  . TRANSTHORACIC ECHOCARDIOGRAM  05-18-2014   ef 60-65%//   last MUGA  (08-08-2015)  ef 56.6%      Family History Family History  Problem Relation Age of Onset  . Diabetes Father   . Heart attack Maternal Grandmother 30       multiple over lifetime.  . Cancer Maternal Grandmother 80       NOS  . Prostate cancer Maternal Grandfather        dx in his 55s  . Lung cancer Paternal Grandfather        dx <50  . Lymphoma Maternal Aunt        dx in her 39s  . Melanoma Cousin 40       maternal first cousin  . Brain cancer Cousin        paternal first cousin dx under 12  . Prostate cancer Other        MGF's father  . Colon cancer Other        MGM's mother     Social History  reports that she quit smoking  about 23 years ago. Her smoking use included cigarettes. She has never used smokeless tobacco. She reports current alcohol use. She reports that she does not use drugs.  Medications  Current Outpatient Medications:  .  acetaminophen (TYLENOL) 325 MG tablet, Take 650 mg by mouth every 6 (six) hours as needed for moderate pain or headache., Disp: , Rfl:  .  calcium carbonate (TUMS - DOSED IN MG ELEMENTAL CALCIUM) 500 MG chewable tablet, Chew 2 tablets by mouth daily. , Disp: , Rfl:  .  Calcium-Phosphorus-Vitamin D (CALCIUM/D3 ADULT GUMMIES PO), Take 2 tablets by mouth 2 (two) times daily. dosage of calcium 1246m and vitamin d 10070m Disp: , Rfl:  .  ciprofloxacin (CIPRO) 500  MG tablet, Take 1 tablet (500 mg total) by mouth 2 (two) times daily., Disp: 10 tablet, Rfl: 0 .  cyclobenzaprine (FLEXERIL) 10 MG tablet, cyclobenzaprine 10 mg tablet  Take 1 tablet(s) q 6-8 hrs as needed for spasm, Disp: , Rfl:  .  desvenlafaxine (PRISTIQ) 100 MG 24 hr tablet, Take 1 tablet (100 mg total) by mouth daily., Disp: 90 tablet, Rfl: 1 .  dicyclomine (BENTYL) 20 MG tablet, Take 1 tablet (20 mg total) by mouth 4 (four) times daily -  before meals and at bedtime., Disp: 40 tablet, Rfl: 0 .  diphenhydrAMINE (BENADRYL) 25 mg capsule, Take 25 mg by mouth at bedtime as needed for itching. , Disp: , Rfl:  .  diphenoxylate-atropine (LOMOTIL) 2.5-0.025 MG tablet, Take 2 tablets by mouth 4 (four) times daily as needed for diarrhea or loose stools., Disp: 60 tablet, Rfl: 1 .  DUREZOL 0.05 % EMUL, INSTILL ONE DROP IN THE RIGHT EYE TWICE DAILY TIME ONE WEEK THEN ONCE DAILY, Disp: , Rfl: 2 .  furosemide (LASIX) 20 MG tablet, TAKE 1 TABLET BY MOUTH DAILY, Disp: 30 tablet, Rfl: 2 .  gabapentin (NEURONTIN) 300 MG capsule, TAKE TWO CAPSULES BY MOUTH AT BEDTIME, Disp: 60 capsule, Rfl: 5 .  HYDROmorphone (DILAUDID) 2 MG tablet, Dilaudid 2 mg tablet  1 q 4-6 hrs prn pain, Disp: , Rfl:  .  lidocaine-prilocaine (EMLA) cream, Apply 1  application topically daily as needed (apply to port before chemo)., Disp: 30 g, Rfl: 2 .  lidocaine-prilocaine (EMLA) cream, Apply to affected area once, Disp: 30 g, Rfl: 3 .  LORazepam (ATIVAN) 0.5 MG tablet, TAKE 1 TABLET BY MOUTH EVERY 6 HOURS AS NEEDED FOR ANXIETY, Disp: 30 tablet, Rfl: 1 .  Melatonin 10 MG CAPS, Take 1 tablet by mouth at bedtime. Pt states she takes one tablet at night for sleep , Disp: , Rfl:  .  morphine (MS CONTIN) 30 MG 12 hr tablet, TAKE 1 TABLET BY MOUTH EVERY TWELVE HOURS, Disp: 60 tablet, Rfl: 0 .  NARCAN 4 MG/0.1ML LIQD nasal spray kit, CALL 911. ADMINISTER A SINGLE SPRAY OF NARCAN IN ONE NOSTRIL, REPEAT EVERY 3 MINUTES AS NEEDED IF NO OR MINIMAL RESPONSE, Disp: , Rfl: 99 .  ondansetron (ZOFRAN) 4 MG tablet, Zofran 4 mg tablet  1 q 6-8 hrs as needed for post op nausea, Disp: , Rfl:  .  ondansetron (ZOFRAN) 8 MG tablet, TAKE ONE TABLET BY MOUTH EVERY 8 HOURS AS NEEDED FOR NAUSEA AND VOMITING, Disp: , Rfl: 4 .  oxyCODONE (OXY IR/ROXICODONE) 5 MG immediate release tablet, Take 1-2 tablets (5-10 mg total) by mouth every 4 (four) hours as needed for moderate pain., Disp: 60 tablet, Rfl: 0 .  pantoprazole (PROTONIX) 40 MG tablet, Take 1 tablet (40 mg total) by mouth every 12 (twelve) hours., Disp: 60 tablet, Rfl: 3 .  potassium chloride SA (K-DUR,KLOR-CON) 20 MEQ tablet, Take 2 tablets (40 mEq total) by mouth daily., Disp: 30 tablet, Rfl: 3 .  promethazine (PHENERGAN) 25 MG tablet, TAKE 1 TABLET BY MOUTH EVERY SIX HOURS AS NEEDED FOR NAUSEA OR VOMITING, Disp: 30 tablet, Rfl: 6 .  rivaroxaban (XARELTO) 20 MG TABS tablet, TAKE 1 TABLET BY MOUTH EVERY DAY WITH SUPPER, Disp: 30 tablet, Rfl: 3 .  spironolactone (ALDACTONE) 25 MG tablet, Take 1 tablet (25 mg total) by mouth daily., Disp: 30 tablet, Rfl: 3 .  Zoledronic Acid (ZOMETA IV), Inject into the vein every 3 (three) months., Disp: , Rfl:  .  zolpidem (AMBIEN) 10 MG tablet, TAKE 1 TABLET BY MOUTH AT BEDTIME AS NEEDED FOR  SLEEP, Disp: 30 tablet, Rfl: 0 No current facility-administered medications for this visit.   Facility-Administered Medications Ordered in Other Visits:  .  0.9 %  sodium chloride infusion, , Intravenous, Continuous, Rasaan Brotherton, Mathis Dad, MD, Stopped at 04/25/18 1244  Allergies Patient has no known allergies.  Review of Systems Review of Systems - Oncology ROS negative   Physical Exam  Vitals Wt Readings from Last 3 Encounters:  04/25/18 269 lb 14.4 oz (122.4 kg)  04/15/18 274 lb 11.1 oz (124.6 kg)  03/25/18 270 lb 3.2 oz (122.6 kg)   Temp Readings from Last 3 Encounters:  04/25/18 98.4 F (36.9 C) (Oral)  04/25/18 98.7 F (37.1 C) (Oral)  04/15/18 98.8 F (37.1 C) (Oral)   BP Readings from Last 3 Encounters:  04/25/18 130/63  04/25/18 122/75  04/15/18 96/72   Pulse Readings from Last 3 Encounters:  04/25/18 82  04/25/18 (!) 101  04/15/18 90   Constitutional: Well-developed, well-nourished, and in no distress.   HENT: Head: Normocephalic and atraumatic.  Mouth/Throat: No oropharyngeal exudate. Mucosa moist. Eyes: Pupils are equal, round, and reactive to light. Conjunctivae are normal. No scleral icterus.  Neck: Normal range of motion. Neck supple. No JVD present.  Cardiovascular: Normal rate, regular rhythm and normal heart sounds.  Exam reveals no gallop and no friction rub.   No murmur heard. Pulmonary/Chest: Effort normal and breath sounds normal. No respiratory distress. No wheezes.No rales.  Abdominal: Soft. Bowel sounds are normal. No distension. There is no tenderness. There is no guarding.  Musculoskeletal: No edema or tenderness. Left arm in sling. Lymphadenopathy: No cervical, axillary or supraclavicular adenopathy.  Neurological: Alert and oriented to person, place, and time. No cranial nerve deficit.  Skin: Skin is warm and dry. No rash noted. No erythema. No pallor.  Psychiatric: Affect and judgment normal.   Labs No visits with results within 3 Day(s)  from this visit.  Latest known visit with results is:  Lab on 04/22/2018  Component Date Value Ref Range Status  . WBC 04/22/2018 5.4  4.0 - 10.5 K/uL Final  . RBC 04/22/2018 3.75* 3.87 - 5.11 MIL/uL Final  . Hemoglobin 04/22/2018 9.0* 12.0 - 15.0 g/dL Final  . HCT 04/22/2018 30.4* 36.0 - 46.0 % Final  . MCV 04/22/2018 81.1  80.0 - 100.0 fL Final  . MCH 04/22/2018 24.0* 26.0 - 34.0 pg Final  . MCHC 04/22/2018 29.6* 30.0 - 36.0 g/dL Final  . RDW 04/22/2018 19.3* 11.5 - 15.5 % Final  . Platelets 04/22/2018 98* 150 - 400 K/uL Final   Comment: PLATELET COUNT CONFIRMED BY SMEAR SPECIMEN CHECKED FOR CLOTS Immature Platelet Fraction may be clinically indicated, consider ordering this additional test [id number]   . nRBC 04/22/2018 0.7* 0.0 - 0.2 % Final  . Neutrophils Relative % 04/22/2018 79  % Final  . Neutro Abs 04/22/2018 4.3  1.7 - 7.7 K/uL Final  . Lymphocytes Relative 04/22/2018 9  % Final  . Lymphs Abs 04/22/2018 0.5* 0.7 - 4.0 K/uL Final  . Monocytes Relative 04/22/2018 10  % Final  . Monocytes Absolute 04/22/2018 0.5  0.1 - 1.0 K/uL Final  . Eosinophils Relative 04/22/2018 0  % Final  . Eosinophils Absolute 04/22/2018 0.0  0.0 - 0.5 K/uL Final  . Basophils Relative 04/22/2018 0  % Final  . Basophils Absolute 04/22/2018 0.0  0.0 - 0.1 K/uL Final  . Immature Granulocytes 04/22/2018 2  %  Final  . Abs Immature Granulocytes 04/22/2018 0.08* 0.00 - 0.07 K/uL Final   Performed at [facility], [address], [place], [place] [address]  . Sodium 04/22/2018 134* 135 - 145 mmol/L Final  . Potassium 04/22/2018 2.9* 3.5 - 5.1 mmol/L Final  . Chloride 04/22/2018 97* 98 - 111 mmol/L Final  . CO2 04/22/2018 27  22 - 32 mmol/L Final  . Glucose, Bld 04/22/2018 168* 70 - 99 mg/dL Final  . BUN 04/22/2018 5* 6 - 20 mg/dL Final  . Creatinine, Ser 04/22/2018 0.74  0.44 - 1.00 mg/dL Final  . Calcium 04/22/2018 8.5* 8.9 - 10.3 mg/dL Final  . Total Protein 04/22/2018 6.8  6.5 - 8.1 g/dL Final  .  Albumin 04/22/2018 3.2* 3.5 - 5.0 g/dL Final  . AST 04/22/2018 82* 15 - 41 U/L Final  . ALT 04/22/2018 38  0 - 44 U/L Final  . Alkaline Phosphatase 04/22/2018 144* 38 - 126 U/L Final  . Total Bilirubin 04/22/2018 0.6  0.3 - 1.2 mg/dL Final  . GFR calc non Af Amer 04/22/2018 >60  >60 mL/min Final  . GFR calc Af Amer 04/22/2018 >60  >60 mL/min Final  . Anion gap 04/22/2018 10  5 - 15 Final   Performed at [facility], [address] [place], [place] [address]  . CA 15-3 04/22/2018 56.6* 0.0 - 25.0 U/mL Final   Comment: (NOTE) Roche Diagnostics Electrochemiluminescence Immunoassay (ECLIA) Values obtained with different assay methods or kits cannot be used interchangeably.  Results cannot be interpreted as absolute evidence of the presence or absence of malignant disease. Performed At: [facility] [place], [place] [id number] Rush Farmer MD [id number]   . CA 27.29 04/22/2018 73.1* 0.0 - 38.6 U/mL Final   Comment: (NOTE) Siemens Centaur Immunochemiluminometric Methodology [facility]) Values obtained with different assay methods or kits cannot be used interchangeably. Results cannot be interpreted as absolute evidence of the presence or absence of malignant disease. Performed At: [facility] [place], [place] [id number] Rush Farmer MD [id number]   . LDH 04/22/2018 326* 98 - 192 U/L Final   Performed at [facility], [address] [place], [place] [address]  . Magnesium 04/22/2018 2.1  1.7 - 2.4 mg/dL Final   Performed at [facility], [address] [place], [place] [address]  . Color, Urine 04/22/2018 ORANGE* YELLOW Final   BIOCHEMICALS MAY BE AFFECTED BY COLOR  . APPearance 04/22/2018 HAZY* CLEAR Final  . Specific Gravity, Urine 04/22/2018 1.010  1.005 - 1.030 Final  . pH 04/22/2018 6.0  5.0 - 8.0 Final  . Glucose, UA 04/22/2018 100* NEGATIVE mg/dL Final  . Hgb urine dipstick 04/22/2018 NEGATIVE  NEGATIVE Final  . Bilirubin  Urine 04/22/2018 NEGATIVE  NEGATIVE Final  . Ketones, ur 04/22/2018 NEGATIVE  NEGATIVE mg/dL Final  . Protein, ur 04/22/2018 30* NEGATIVE mg/dL Final  . Nitrite 04/22/2018 POSITIVE* NEGATIVE Final  . Leukocytes, UA 04/22/2018 TRACE* NEGATIVE Final   Performed at [facility], [address], [place], [place] [address]  . RBC / HPF 04/22/2018 0-5  0 - 5 RBC/hpf Final  . WBC, UA 04/22/2018 0-5  0 - 5 WBC/hpf Final  . Bacteria, UA 04/22/2018 FEW* NONE SEEN Final  . Squamous Epithelial / LPF 04/22/2018 0-5  0 - 5 Final  . Mucus 04/22/2018 PRESENT   Final  . Hyaline Casts, UA 04/22/2018 PRESENT   Final   Performed at [facility], [address] [place], [place] [address]  Pathology Orders Placed This Encounter  Procedures  . CT Head W Wo Contrast    Standing Status:   Future    Number of Occurrences:   1    Standing Expiration Date:   04/25/2019    Order Specific Question:   ** REASON FOR EXAM (FREE TEXT)    Answer:   metastatic breast cancer    Order Specific Question:   If indicated for the ordered procedure, I authorize the administration of contrast media per Radiology protocol    Answer:   Yes    Order Specific Question:   Is patient pregnant?    Answer:   No    Order Specific Question:   Preferred imaging location?    Answer:   [facility]    Order Specific Question:   Radiology Contrast Protocol - do NOT remove file path    Answer:   [email or web address]       Zoila Shutter MD

## 2018-04-25 NOTE — Patient Instructions (Signed)
[facility]  Discharge Instructions: You saw Dr. Higgs today                               _______________________________________________________________  Thank you for choosing [facility] at [facility] to provide your oncology and hematology care.  To afford each patient quality time with our providers, please arrive at least 15 minutes before your scheduled appointment.  You need to re-schedule your appointment if you arrive 10 or more minutes late.  We strive to give you quality time with our providers, and arriving late affects you and other patients whose appointments are after yours.  Also, if you no show three or more times for appointments you may be dismissed from the clinic.  Again, thank you for choosing [facility] at [facility]. Our hope is that these requests will allow you access to exceptional care and in a timely manner. _______________________________________________________________  If you have questions after your visit, please contact our office at [phone] between the hours of 8:30 a.m. and 5:00 p.m. Voicemails left after 4:30 p.m. will not be returned until the following business day. _______________________________________________________________  For prescription refill requests, have your pharmacy contact our office. _______________________________________________________________  Recommendations made by the consultant and any test results will be sent to your referring physician. _______________________________________________________________ 

## 2018-04-26 ENCOUNTER — Telehealth (HOSPITAL_COMMUNITY): Payer: Self-pay

## 2018-04-26 DIAGNOSIS — M25512 Pain in left shoulder: Secondary | ICD-10-CM | POA: Diagnosis not present

## 2018-04-26 DIAGNOSIS — M25612 Stiffness of left shoulder, not elsewhere classified: Secondary | ICD-10-CM | POA: Diagnosis not present

## 2018-04-26 NOTE — Telephone Encounter (Signed)
Called patient to let her know that her scans from yesterday were stable. No new issues at this time. Spoke with husband and he understood.

## 2018-04-28 DIAGNOSIS — M25612 Stiffness of left shoulder, not elsewhere classified: Secondary | ICD-10-CM | POA: Diagnosis not present

## 2018-04-28 DIAGNOSIS — M25512 Pain in left shoulder: Secondary | ICD-10-CM | POA: Diagnosis not present

## 2018-05-03 ENCOUNTER — Other Ambulatory Visit (HOSPITAL_COMMUNITY): Payer: Self-pay | Admitting: *Deleted

## 2018-05-03 ENCOUNTER — Other Ambulatory Visit (HOSPITAL_COMMUNITY): Payer: Self-pay | Admitting: Internal Medicine

## 2018-05-03 DIAGNOSIS — C50919 Malignant neoplasm of unspecified site of unspecified female breast: Secondary | ICD-10-CM

## 2018-05-03 DIAGNOSIS — C7951 Secondary malignant neoplasm of bone: Secondary | ICD-10-CM

## 2018-05-03 DIAGNOSIS — C229 Malignant neoplasm of liver, not specified as primary or secondary: Secondary | ICD-10-CM

## 2018-05-03 DIAGNOSIS — Z0000 Encounter for general adult medical examination without abnormal findings: Secondary | ICD-10-CM

## 2018-05-03 DIAGNOSIS — R5382 Chronic fatigue, unspecified: Secondary | ICD-10-CM

## 2018-05-03 MED ORDER — GABAPENTIN 300 MG PO CAPS: 600.0000 mg | ORAL_CAPSULE | Freq: Every day | ORAL | 1 refills | Status: AC

## 2018-05-05 DIAGNOSIS — M25512 Pain in left shoulder: Secondary | ICD-10-CM | POA: Diagnosis not present

## 2018-05-05 DIAGNOSIS — M25612 Stiffness of left shoulder, not elsewhere classified: Secondary | ICD-10-CM | POA: Diagnosis not present

## 2018-05-06 ENCOUNTER — Inpatient Hospital Stay (HOSPITAL_COMMUNITY): Payer: Medicare Other

## 2018-05-06 VITALS — BP 114/60 | HR 77 | Temp 98.4°F | Resp 18 | Wt 275.1 lb

## 2018-05-06 DIAGNOSIS — C7931 Secondary malignant neoplasm of brain: Secondary | ICD-10-CM | POA: Diagnosis not present

## 2018-05-06 DIAGNOSIS — C7951 Secondary malignant neoplasm of bone: Secondary | ICD-10-CM

## 2018-05-06 DIAGNOSIS — Z5112 Encounter for antineoplastic immunotherapy: Secondary | ICD-10-CM | POA: Diagnosis not present

## 2018-05-06 DIAGNOSIS — C50919 Malignant neoplasm of unspecified site of unspecified female breast: Secondary | ICD-10-CM

## 2018-05-06 DIAGNOSIS — C787 Secondary malignant neoplasm of liver and intrahepatic bile duct: Secondary | ICD-10-CM | POA: Diagnosis not present

## 2018-05-06 DIAGNOSIS — Z170 Estrogen receptor positive status [ER+]: Secondary | ICD-10-CM | POA: Diagnosis not present

## 2018-05-06 LAB — COMPREHENSIVE METABOLIC PANEL
ALT: 22 U/L (ref 0–44)
AST: 36 U/L (ref 15–41)
Albumin: 3.2 g/dL — ABNORMAL LOW (ref 3.5–5.0)
Alkaline Phosphatase: 121 U/L (ref 38–126)
Anion gap: 8 (ref 5–15)
BUN: 8 mg/dL (ref 6–20)
CO2: 28 mmol/L (ref 22–32)
Calcium: 9.1 mg/dL (ref 8.9–10.3)
Chloride: 102 mmol/L (ref 98–111)
Creatinine, Ser: 0.85 mg/dL (ref 0.44–1.00)
GFR calc Af Amer: >60 mL/min (ref >60)
GFR calc non Af Amer: >60 mL/min (ref >60)
Glucose, Bld: 197 mg/dL — ABNORMAL HIGH (ref 70–99)
Potassium: 3.8 mmol/L (ref 3.5–5.1)
Sodium: 138 mmol/L (ref 135–145)
Total Bilirubin: 0.5 mg/dL (ref 0.3–1.2)
Total Protein: 6.9 g/dL (ref 6.5–8.1)

## 2018-05-06 LAB — MAGNESIUM: Magnesium: 2.1 mg/dL (ref 1.7–2.4)

## 2018-05-06 LAB — CBC WITH DIFFERENTIAL/PLATELET
ABS IMMATURE GRANULOCYTES: 0.05 10*3/uL (ref 0.00–0.07)
Basophils Absolute: 0 10*3/uL (ref 0.0–0.1)
Basophils Relative: 0 %
Eosinophils Absolute: 0.1 10*3/uL (ref 0.0–0.5)
Eosinophils Relative: 1 %
HCT: 28.9 % — ABNORMAL LOW (ref 36.0–46.0)
Hemoglobin: 8.3 g/dL — ABNORMAL LOW (ref 12.0–15.0)
Immature Granulocytes: 1 %
Lymphocytes Relative: 13 %
Lymphs Abs: 0.7 10*3/uL (ref 0.7–4.0)
MCH: 24.1 pg — ABNORMAL LOW (ref 26.0–34.0)
MCHC: 28.7 g/dL — ABNORMAL LOW (ref 30.0–36.0)
MCV: 84 fL (ref 80.0–100.0)
Monocytes Absolute: 0.5 10*3/uL (ref 0.1–1.0)
Monocytes Relative: 9 %
NRBC: 0.4 % — AB (ref 0.0–0.2)
Neutro Abs: 4.3 10*3/uL (ref 1.7–7.7)
Neutrophils Relative %: 76 %
Platelets: 193 10*3/uL (ref 150–400)
RBC: 3.44 MIL/uL — ABNORMAL LOW (ref 3.87–5.11)
RDW: 20.1 % — ABNORMAL HIGH (ref 11.5–15.5)
WBC: 5.7 10*3/uL (ref 4.0–10.5)

## 2018-05-06 LAB — LACTATE DEHYDROGENASE: LDH: 212 U/L — ABNORMAL HIGH (ref 98–192)

## 2018-05-06 MED ORDER — SODIUM CHLORIDE 0.9 % IV SOLN
Freq: Once | INTRAVENOUS | Status: AC
  Administered 2018-05-06: 10:00:00 via INTRAVENOUS

## 2018-05-06 MED ORDER — DIPHENHYDRAMINE HCL 25 MG PO CAPS
ORAL_CAPSULE | ORAL | Status: AC
  Filled 2018-05-06: qty 2

## 2018-05-06 MED ORDER — DIPHENHYDRAMINE HCL 25 MG PO CAPS
50.0000 mg | ORAL_CAPSULE | Freq: Once | ORAL | Status: AC
  Administered 2018-05-06: 50 mg via ORAL

## 2018-05-06 MED ORDER — ACETAMINOPHEN 325 MG PO TABS
650.0000 mg | ORAL_TABLET | Freq: Once | ORAL | Status: AC
  Administered 2018-05-06: 650 mg via ORAL

## 2018-05-06 MED ORDER — ACETAMINOPHEN 325 MG PO TABS
ORAL_TABLET | ORAL | Status: AC
  Filled 2018-05-06: qty 2

## 2018-05-06 MED ORDER — SODIUM CHLORIDE 0.9% FLUSH
10.0000 mL | INTRAVENOUS | Status: DC | PRN
  Administered 2018-05-06: 10 mL
  Filled 2018-05-06: qty 10

## 2018-05-06 MED ORDER — ZOLEDRONIC ACID 4 MG/100ML IV SOLN
4.0000 mg | Freq: Once | INTRAVENOUS | Status: AC
  Administered 2018-05-06: 4 mg via INTRAVENOUS
  Filled 2018-05-06: qty 100

## 2018-05-06 MED ORDER — SODIUM CHLORIDE 0.9 % IV SOLN
420.0000 mg | Freq: Once | INTRAVENOUS | Status: AC
  Administered 2018-05-06: 420 mg via INTRAVENOUS
  Filled 2018-05-06: qty 16

## 2018-05-06 MED ORDER — HEPARIN SOD (PORK) LOCK FLUSH 100 UNIT/ML IV SOLN
500.0000 [IU] | Freq: Once | INTRAVENOUS | Status: AC | PRN
  Administered 2018-05-06: 500 [IU]

## 2018-05-06 NOTE — Progress Notes (Signed)
[place] reviewed with Dr. Walden Field. Pt has not had an ECHO in the past 3 months, her last one was on 01/17/18. Per MD, it is ok to proceed with Kadcyla and Zometa today and we will schedule the patient for her ECHO in January. Pt and husband made aware.  [name] tolerated Kadcyla and Zometa infusions without incident or complaint. Ca reviewed, 9.1 and pt reports no recent dental work or jaw/tooth pain. She states she continues to take her Ca and Vit D supplements as instructed. VSS upon completion of treatment. Pt observed for 30 minutes post infusion. Discharged self ambulatory in satisfactory condition in presence of husband.

## 2018-05-06 NOTE — Patient Instructions (Signed)
[facility] Discharge Instructions for Patients Receiving Chemotherapy   Beginning January 23rd 2017 lab work for the [facility] will be done in the  Main lab at [facility] on 1st floor. If you have a lab appointment with the [facility] please come in thru the  Main Entrance and check in at the main information desk   Today you received the following chemotherapy agents Kadcyla and Zometa. We will schedule you for an ECHO in January.  To help prevent nausea and vomiting after your treatment, we encourage you to take your nausea medication   If you develop nausea and vomiting, or diarrhea that is not controlled by your medication, call the clinic.  The clinic phone number is [phone]. Office hours are Monday-Friday 8:30am-5:00pm.  BELOW ARE SYMPTOMS THAT SHOULD BE REPORTED IMMEDIATELY:  *FEVER GREATER THAN 101.0 F  *CHILLS WITH OR WITHOUT FEVER  NAUSEA AND VOMITING THAT IS NOT CONTROLLED WITH YOUR NAUSEA MEDICATION  *UNUSUAL SHORTNESS OF BREATH  *UNUSUAL BRUISING OR BLEEDING  TENDERNESS IN MOUTH AND THROAT WITH OR WITHOUT PRESENCE OF ULCERS  *URINARY PROBLEMS  *BOWEL PROBLEMS  UNUSUAL RASH Items with * indicate a potential emergency and should be followed up as soon as possible. If you have an emergency after office hours please contact your primary care physician or go to the nearest emergency department.  Please call the clinic during office hours if you have any questions or concerns.   You may also contact the Patient Navigator at [phone] should you have any questions or need assistance in obtaining follow up care.      Resources For Cancer Patients and their Caregivers ? American Cancer Society: Can assist with transportation, wigs, general needs, runs Look Good Feel Better.        220-346-6175 ? Cancer Care: Provides financial assistance, online support groups, medication/co-pay assistance.  1-800-813-HOPE (340)464-2479) ? [facility] Assists [place] cancer patients and their families through emotional , educational and financial support.  (332)193-8992 ? [place] DSS Where to apply for food stamps, Medicaid and utility assistance. 737-779-1248 ? [facility]: Transportation to medical appointments. 6466755775 ? Social Security Administration: May apply for disability if have a Stage IV cancer. 361-177-4385 (304)857-5837 ? [employer], Disability and Transit Services: Assists with nutrition, care and transit needs. 214-140-1164

## 2018-05-09 ENCOUNTER — Other Ambulatory Visit (HOSPITAL_COMMUNITY): Payer: Self-pay | Admitting: *Deleted

## 2018-05-09 DIAGNOSIS — C50919 Malignant neoplasm of unspecified site of unspecified female breast: Secondary | ICD-10-CM

## 2018-05-09 DIAGNOSIS — F418 Other specified anxiety disorders: Secondary | ICD-10-CM

## 2018-05-09 MED ORDER — DESVENLAFAXINE SUCCINATE ER 100 MG PO TB24: 100.0000 mg | ORAL_TABLET | Freq: Every day | ORAL | 1 refills | Status: AC

## 2018-05-10 ENCOUNTER — Emergency Department (HOSPITAL_COMMUNITY)
Admission: EM | Admit: 2018-05-10 | Discharge: 2018-05-10 | Disposition: A | Discharge status: Home / Self Care | Payer: Medicare Other | Attending: Emergency Medicine | Admitting: Emergency Medicine

## 2018-05-10 ENCOUNTER — Encounter (HOSPITAL_COMMUNITY): Payer: Self-pay

## 2018-05-10 ENCOUNTER — Telehealth (HOSPITAL_COMMUNITY): Payer: Self-pay | Admitting: *Deleted

## 2018-05-10 ENCOUNTER — Emergency Department (HOSPITAL_COMMUNITY): Payer: Medicare Other

## 2018-05-10 ENCOUNTER — Other Ambulatory Visit: Payer: Self-pay

## 2018-05-10 DIAGNOSIS — S199XXA Unspecified injury of neck, initial encounter: Secondary | ICD-10-CM | POA: Diagnosis not present

## 2018-05-10 DIAGNOSIS — W19XXXA Unspecified fall, initial encounter: Secondary | ICD-10-CM

## 2018-05-10 DIAGNOSIS — S5001XA Contusion of right elbow, initial encounter: Secondary | ICD-10-CM

## 2018-05-10 DIAGNOSIS — Y999 Unspecified external cause status: Secondary | ICD-10-CM | POA: Insufficient documentation

## 2018-05-10 DIAGNOSIS — Z79899 Other long term (current) drug therapy: Secondary | ICD-10-CM | POA: Diagnosis not present

## 2018-05-10 DIAGNOSIS — W182XXA Fall in (into) shower or empty bathtub, initial encounter: Secondary | ICD-10-CM | POA: Insufficient documentation

## 2018-05-10 DIAGNOSIS — S4992XA Unspecified injury of left shoulder and upper arm, initial encounter: Secondary | ICD-10-CM

## 2018-05-10 DIAGNOSIS — Y93E1 Activity, personal bathing and showering: Secondary | ICD-10-CM | POA: Diagnosis not present

## 2018-05-10 DIAGNOSIS — Z7901 Long term (current) use of anticoagulants: Secondary | ICD-10-CM | POA: Insufficient documentation

## 2018-05-10 DIAGNOSIS — Z87891 Personal history of nicotine dependence: Secondary | ICD-10-CM | POA: Diagnosis not present

## 2018-05-10 DIAGNOSIS — M25512 Pain in left shoulder: Secondary | ICD-10-CM | POA: Diagnosis not present

## 2018-05-10 DIAGNOSIS — S0990XA Unspecified injury of head, initial encounter: Secondary | ICD-10-CM | POA: Diagnosis not present

## 2018-05-10 DIAGNOSIS — Y92002 Bathroom of unspecified non-institutional (private) residence single-family (private) house as the place of occurrence of the external cause: Secondary | ICD-10-CM | POA: Diagnosis not present

## 2018-05-10 LAB — CBC WITH DIFFERENTIAL/PLATELET
Abs Immature Granulocytes: 0.09 10*3/uL — ABNORMAL HIGH (ref 0.00–0.07)
BASOS PCT: 0 %
Basophils Absolute: 0 10*3/uL (ref 0.0–0.1)
Eosinophils Absolute: 0 10*3/uL (ref 0.0–0.5)
Eosinophils Relative: 0 %
HCT: 29.3 % — ABNORMAL LOW (ref 36.0–46.0)
HEMOGLOBIN: 8.4 g/dL — AB (ref 12.0–15.0)
Immature Granulocytes: 2 %
LYMPHS PCT: 10 %
Lymphs Abs: 0.5 10*3/uL — ABNORMAL LOW (ref 0.7–4.0)
MCH: 23.6 pg — ABNORMAL LOW (ref 26.0–34.0)
MCHC: 28.7 g/dL — ABNORMAL LOW (ref 30.0–36.0)
MCV: 82.3 fL (ref 80.0–100.0)
Monocytes Absolute: 0.7 10*3/uL (ref 0.1–1.0)
Monocytes Relative: 14 %
NEUTROS ABS: 3.9 10*3/uL (ref 1.7–7.7)
Neutrophils Relative %: 74 %
Platelets: 138 10*3/uL — ABNORMAL LOW (ref 150–400)
RBC: 3.56 MIL/uL — AB (ref 3.87–5.11)
RDW: 19.9 % — ABNORMAL HIGH (ref 11.5–15.5)
WBC: 5.3 10*3/uL (ref 4.0–10.5)
nRBC: 0.4 % — ABNORMAL HIGH (ref 0.0–0.2)

## 2018-05-10 LAB — COMPREHENSIVE METABOLIC PANEL
ALT: 25 U/L (ref 0–44)
AST: 57 U/L — ABNORMAL HIGH (ref 15–41)
Albumin: 3.4 g/dL — ABNORMAL LOW (ref 3.5–5.0)
Alkaline Phosphatase: 133 U/L — ABNORMAL HIGH (ref 38–126)
Anion gap: 10 (ref 5–15)
BUN: 6 mg/dL (ref 6–20)
CO2: 27 mmol/L (ref 22–32)
Calcium: 8.7 mg/dL — ABNORMAL LOW (ref 8.9–10.3)
Chloride: 102 mmol/L (ref 98–111)
Creatinine, Ser: 0.74 mg/dL (ref 0.44–1.00)
GFR calc Af Amer: >60 mL/min (ref >60)
GFR calc non Af Amer: >60 mL/min (ref >60)
Glucose, Bld: 122 mg/dL — ABNORMAL HIGH (ref 70–99)
Potassium: 3.4 mmol/L — ABNORMAL LOW (ref 3.5–5.1)
Sodium: 139 mmol/L (ref 135–145)
Total Bilirubin: 0.7 mg/dL (ref 0.3–1.2)
Total Protein: 7 g/dL (ref 6.5–8.1)

## 2018-05-10 MED ORDER — SODIUM CHLORIDE 0.9 % IV SOLN
INTRAVENOUS | Status: DC
  Administered 2018-05-10: 13:00:00 via INTRAVENOUS

## 2018-05-10 NOTE — Discharge Instructions (Addendum)
Work-up here today without significant changes in your labs.  Head CT negative CT of neck negative.  X-ray of left shoulder without any new injuries or findings.  An x-ray of right elbow without any bony injuries.  Follow-up with hematology oncology as scheduled.  Return for any new or worse symptoms.

## 2018-05-10 NOTE — ED Provider Notes (Addendum)
[name] EMERGENCY DEPARTMENT Provider Note   CSN: [id number] Arrival date & time: 05/10/18  1036     History   Chief Complaint Chief Complaint  Patient presents with  . Fall    HPI [name] is a 52 y.o. female.  Patient followed by hematology oncology for stage IV adenocarcinoma of the breast.  Known to have brain mets and a bony mets.  However the brain mets have resolved with treatment.  Patient slipped and fell in the shower today her husband was there immediately no loss of consciousness.  She did hit the top of her head.  Is complaining of pain to her left shoulder where she recently had some surgery done for like a bursitis and cleaning out cartilage in that area by Dr. Onnie Graham.  Also complained of right elbow pain has a little bit of swelling to the proximal right forearm.  No back pain no hip pain or any leg pain.  Patient is on the blood thinner Xarelto.  Patient has had a recent CT done by hematology oncology of the head due to some confusion.  But that CT was negative.     Past Medical History:  Diagnosis Date  . Anticoagulated    xarelto  . Anxiety   . Breast cancer metastasized to multiple sites [facility])    liver, brain, and bone  . Breast cancer, stage 4 [facility]) oncologist-  dr Larene [place] ([facility])   dx 12/ 2015 -- breast cancer Stage 4,  ER/HER2 +,  w/  liver, brain and  bone mets/  chemotherapy and radiation therapy  . Chronic pain syndrome    secondary to cancer   . Depression   . Drug-induced cardiomyopathy ([place])    per last MUGA (08-08-2015), ef 56.5/ per last echo 05-18-2014 ef 60-65%  . Family history of prostate cancer   . GERD (gastroesophageal reflux disease)   . History of colon polyps    07-13-2013  benign  . History of DVT (deep vein thrombosis)    07-09-2014  upper right extremity-  RIJ and right subclavian--  resolved  . History of gastritis    erosive  . History of pneumonia    HCAP 06-07-2015--  resolved per cxr  07-04-2015  . History of radiation therapy    12 fractions to L3 - S3, 30Gy and left spacula 20Gy (06-20-2014 to 07-10-2014) //  whole brain rxt (10-09-2014 to 10-26-2014)  . History of small bowel obstruction    S/P RESECTION 2008  . Migraine   . PONV (postoperative nausea and vomiting)    pt states scope patch does well    Patient Active Problem List   Diagnosis Date Noted  . Mouth sores 03/18/2016  . Esophageal reflux   . Metastatic breast cancer ([place])   . HCAP (healthcare-associated pneumonia) 06/02/2015  . Pneumonia 06/02/2015  . Drug-induced cardiomyopathy ([place]) 02/15/2015  . Brain metastases ([place]) 01/29/2015  . Genetic testing 09/05/2014  . Family history of prostate cancer   . GERD (gastroesophageal reflux disease) 07/16/2014  . DVT (deep venous thrombosis) ([place]) 07/09/2014  . Bone metastases ([place]) 05/23/2014  . Breast cancer, stage 4 ([place]) 05/16/2014  . Hyperlipidemia 04/23/2014  . Depression 04/23/2014    Past Surgical History:  Procedure Laterality Date  . BREAST REDUCTION SURGERY  03/17/2011   Procedure: MAMMARY REDUCTION BILATERAL (BREAST);  Surgeon: Mary A Contogiannis;  Location: [place];  Service: Plastics;  Laterality: Bilateral;  . CATARACT EXTRACTION W/ INTRAOCULAR LENS  IMPLANT, BILATERAL  2008  . CERVICAL FUSION  2003   C5 -- C6  . COLONOSCOPY N/A 07/13/2013   Procedure: COLONOSCOPY;  Surgeon: Rogene Houston, MD;  [facility];  Service: Endoscopy;  Laterality: N/A;  930  . COLONOSCOPY N/A 11/26/2014   Procedure: COLONOSCOPY;  Surgeon: Rogene Houston, MD;  [facility];  Service: Endoscopy;  Laterality: N/A;  730  . D & C HYSTEROSCOPY/  RESECTION ENDOMETRIAL MASS/  [place] ENDOMETRIAL ABLATION  04-11-2010  . DX LAPAROSCOPY W/ PARTIAL SMALL BOWEL RESECTION AND APPENDECTOMY  04-13-2007  . ESOPHAGOGASTRODUODENOSCOPY N/A 05/25/2014   Procedure: ESOPHAGOGASTRODUODENOSCOPY (EGD);  Surgeon: Rogene Houston, MD;  [facility];  Service: Endoscopy;  Laterality: N/A;  155  . KNEE ARTHROSCOPY Right 2005  . LAPAROSCOPIC ASSISTED VAGINAL HYSTERECTOMY  10-13-2010   w/ Bx Left Fallopian tube and Aspiration Right Ovarian Cyst  . LAPAROSCOPIC CHOLECYSTECTOMY  11-17-2002  . LAPAROSCOPIC SALPINGO OOPHERECTOMY Bilateral 08/26/2015   Procedure: LAPAROSCOPIC SALPINGO OOPHORECTOMY, bilateral;  Surgeon: Everlene Farrier, MD;  Location: [place];  Service: Gynecology;  Laterality: Bilateral;  . PORTACATH PLACEMENT  05-17-2014  . REDUCTION MAMMAPLASTY    . SHOULDER ARTHROSCOPY WITH ROTATOR CUFF REPAIR Right 2002  . TRANSTHORACIC ECHOCARDIOGRAM  05-18-2014   ef 60-65%//   last MUGA  (08-08-2015)  ef 56.6%       OB History   No obstetric history on file.      Home Medications    Prior to Admission medications   Medication Sig Start Date End Date Taking? Authorizing Provider  acetaminophen (TYLENOL) 325 MG tablet Take 650 mg by mouth every 6 (six) hours as needed for moderate pain or headache.    [provider]  calcium carbonate (TUMS - DOSED IN MG ELEMENTAL CALCIUM) 500 MG chewable tablet Chew 2 tablets by mouth daily.     [provider]  Calcium-Phosphorus-Vitamin D (CALCIUM/D3 ADULT GUMMIES PO) Take 2 tablets by mouth 2 (two) times daily. dosage of calcium 1280m and vitamin d 10046m   [provider]  ciprofloxacin (CIPRO) 500 MG tablet Take 1 tablet (500 mg total) by mouth 2 (two) times daily. 04/22/18   KaDerek JackMD  cyclobenzaprine (FLEXERIL) 10 MG tablet cyclobenzaprine 10 mg tablet  Take 1 tablet(s) q 6-8 hrs as needed for spasm    [provider]  desvenlafaxine (PRISTIQ) 100 MG 24 hr tablet Take 1 tablet (100 mg total) by mouth daily. 05/09/18   Lockamy, Randi L, NP-C  dicyclomine (BENTYL) 20 MG tablet Take 1 tablet (20 mg total) by mouth 4 (four) times daily -  before meals and at bedtime. 11/17/17   Lockamy, Randi L, NP-C    diphenhydrAMINE (BENADRYL) 25 mg capsule Take 25 mg by mouth at bedtime as needed for itching.     [provider]  diphenoxylate-atropine (LOMOTIL) 2.5-0.025 MG tablet Take 2 tablets by mouth 4 (four) times daily as needed for diarrhea or loose stools. 11/17/17   Lockamy, Randi L, NP-C  DUREZOL 0.05 % EMUL INSTILL ONE DROP IN THE RIGHT EYE TWICE DAILY TIME ONE WEEK THEN ONCE DAILY 10/07/17   [provider]  furosemide (LASIX) 20 MG tablet TAKE 1 TABLET BY MOUTH DAILY 04/22/18   Lockamy, Randi L, NP-C  gabapentin (NEURONTIN) 300 MG capsule Take 2 capsules (600 mg total) by mouth at bedtime. 05/03/18   Higgs, VeMathis DadMD  lidocaine-prilocaine (EMLA) cream Apply 1 application topically daily as needed (apply  to port before chemo). 04/28/17   Holley Bouche, NP  lidocaine-prilocaine (EMLA) cream Apply to affected area once 03/03/18   Higgs, Mathis Dad, MD  LORazepam (ATIVAN) 0.5 MG tablet TAKE 1 TABLET BY MOUTH EVERY 6 HOURS AS NEEDED FOR ANXIETY 05/03/18   Higgs, Mathis Dad, MD  Melatonin 10 MG CAPS Take 1 tablet by mouth at bedtime. Pt states she takes one tablet at night for sleep     [provider]  morphine (MS CONTIN) 30 MG 12 hr tablet TAKE 1 TABLET BY MOUTH EVERY TWELVE HOURS 04/25/18   Higgs, Mathis Dad, MD  NARCAN 4 MG/0.1ML LIQD nasal spray kit CALL 911. ADMINISTER A SINGLE SPRAY OF NARCAN IN ONE NOSTRIL, REPEAT EVERY 3 MINUTES AS NEEDED IF NO OR MINIMAL RESPONSE 04/06/17   [provider]  ondansetron (ZOFRAN) 4 MG tablet Zofran 4 mg tablet  1 q 6-8 hrs as needed for post op nausea    [provider]  ondansetron (ZOFRAN) 8 MG tablet TAKE ONE TABLET BY MOUTH EVERY 8 HOURS AS NEEDED FOR NAUSEA AND VOMITING 11/15/17   [provider]  oxyCODONE (OXY IR/ROXICODONE) 5 MG immediate release tablet Take 1-2 tablets (5-10 mg total) by mouth every 4 (four) hours as needed for moderate pain. 08/10/16   Baird Cancer, PA-C  pantoprazole (PROTONIX) 40 MG tablet  Take 1 tablet (40 mg total) by mouth every 12 (twelve) hours. 03/08/18   Higgs, Mathis Dad, MD  potassium chloride SA (K-DUR,KLOR-CON) 20 MEQ tablet Take 2 tablets (40 mEq total) by mouth daily. 04/22/18   Higgs, Mathis Dad, MD  promethazine (PHENERGAN) 25 MG tablet TAKE 1 TABLET BY MOUTH EVERY SIX HOURS AS NEEDED FOR NAUSEA OR VOMITING 12/23/16   Holley Bouche, NP  rivaroxaban (XARELTO) 20 MG TABS tablet TAKE 1 TABLET BY MOUTH EVERY DAY WITH SUPPER 02/11/18   Lockamy, Randi L, NP-C  spironolactone (ALDACTONE) 25 MG tablet Take 1 tablet (25 mg total) by mouth daily. 10/08/17   Derek Jack, MD  Zoledronic Acid (ZOMETA IV) Inject into the vein every 3 (three) months.    [provider]  zolpidem (AMBIEN) 10 MG tablet TAKE 1 TABLET BY MOUTH AT BEDTIME AS NEEDED FOR SLEEP 04/22/18   Higgs, Mathis Dad, MD    Family History Family History  Problem Relation Age of Onset  . Diabetes Father   . Heart attack Maternal Grandmother 30       multiple over lifetime.  . Cancer Maternal Grandmother 9       NOS  . Prostate cancer Maternal Grandfather        dx in his 46s  . Lung cancer Paternal Grandfather        dx <50  . Lymphoma Maternal Aunt        dx in her 8s  . Melanoma Cousin 19       maternal first cousin  . Brain cancer Cousin        paternal first cousin dx under 49  . Prostate cancer Other        MGF's father  . Colon cancer Other        MGM's mother    Social History Social History   Tobacco Use  . Smoking status: Former Smoker    Types: Cigarettes    Last attempt to quit: 08/20/1994    Years since quitting: 23.7  . Smokeless tobacco: Never Used  Substance Use Topics  . Alcohol use: Yes    Comment: Occasionally  . Drug  use: No     Allergies   Patient has no known allergies.   Review of Systems Review of Systems  Constitutional: Negative for fever.  HENT: Negative for congestion.   Eyes: Negative for redness.  Respiratory: Negative for shortness of breath.     Cardiovascular: Negative for chest pain.  Gastrointestinal: Negative for abdominal pain.  Genitourinary: Negative for dysuria.  Musculoskeletal: Negative for back pain and neck pain.  Allergic/Immunologic: Positive for immunocompromised state.  Neurological: Positive for headaches.  Hematological: Bruises/bleeds easily.  Psychiatric/Behavioral: Negative for confusion.     Physical Exam Updated Vital Signs BP (!) 120/52   Pulse 81   Temp 98.4 F (36.9 C) (Oral)   Resp 11   Ht 1.753 m ('5\' 9"' )   Wt 117.9 kg   SpO2 95%   BMI 38.40 kg/m   Physical Exam Vitals signs and nursing note reviewed.  Constitutional:      General: She is not in acute distress.    Appearance: Normal appearance.  HENT:     Head: Normocephalic.     Comments: Superficial abrasion to the top of the forehead.  Some of her hair got pulled out in that area with the fall.    Mouth/Throat:     Mouth: Mucous membranes are moist.  Eyes:     Extraocular Movements: Extraocular movements intact.     Conjunctiva/sclera: Conjunctivae normal.     Pupils: Pupils are equal, round, and reactive to light.  Neck:     Musculoskeletal: Normal range of motion and neck supple.     Comments: No posterior tenderness to palpation to the cervical spine. Cardiovascular:     Pulses: Normal pulses.     Heart sounds: Normal heart sounds.  Pulmonary:     Effort: Pulmonary effort is normal.     Breath sounds: Normal breath sounds.  Abdominal:     General: Bowel sounds are normal.     Palpations: Abdomen is soft.     Tenderness: There is no abdominal tenderness.  Musculoskeletal: Normal range of motion.        General: No swelling or tenderness.     Comments: Good range of motion of the legs knee and hip area no lumbar thoracic back pain.  To palpation.  No tenderness.  Patient with some swelling to the proximal part of the right forearm with some bruising.  Pretty good range of motion at the elbow distally radial pulses 2+.   Left shoulder area with good range of motion no obvious deformity.  Good movement at the elbow and at the wrist and radial pulses 2+.  Skin:    General: Skin is warm.     Capillary Refill: Capillary refill takes less than 2 seconds.  Neurological:     General: No focal deficit present.     Mental Status: She is alert and oriented to person, place, and time.      ED Treatments / Results  Labs (all labs ordered are listed, but only abnormal results are displayed) Labs Reviewed  CBC WITH DIFFERENTIAL/PLATELET - Abnormal; Notable for the following components:      Result Value   RBC 3.56 (*)    Hemoglobin 8.4 (*)    HCT 29.3 (*)    MCH 23.6 (*)    MCHC 28.7 (*)    RDW 19.9 (*)    Platelets 138 (*)    nRBC 0.4 (*)    Lymphs Abs 0.5 (*)    Abs Immature Granulocytes 0.09 (*)  All other components within normal limits  COMPREHENSIVE METABOLIC PANEL - Abnormal; Notable for the following components:   Potassium 3.4 (*)    Glucose, Bld 122 (*)    Calcium 8.7 (*)    Albumin 3.4 (*)    AST 57 (*)    Alkaline Phosphatase 133 (*)    All other components within normal limits    EKG None  Radiology Dg Elbow Complete Right  Result Date: 05/10/2018 CLINICAL DATA:  Pain and bruising to the right elbow secondary to a fall in her bathroom today. EXAM: RIGHT ELBOW - COMPLETE 3+ VIEW COMPARISON:  None. FINDINGS: There is no evidence of fracture, dislocation, or joint effusion. Tiny osteophyte on the coronoid process of the proximal ulna. Soft tissues are unremarkable. IMPRESSION: No acute abnormalities. Electronically Signed   By: Lorriane Shire M.D.   On: 05/10/2018 13:53   Ct Head Wo Contrast  Result Date: 05/10/2018 CLINICAL DATA:  Status post fall in the bathroom this morning. Patient has a history of stage IV breast cancer. Patient complains pain of the neck and bilateral arms and nausea. EXAM: CT HEAD WITHOUT CONTRAST CT CERVICAL SPINE WITHOUT CONTRAST TECHNIQUE: Multidetector CT  imaging of the head and cervical spine was performed following the standard protocol without intravenous contrast. Multiplanar CT image reconstructions of the cervical spine were also generated. COMPARISON:  MRI of the brain February 23, 2018, head CT April 25, 2018 FINDINGS: CT HEAD FINDINGS Brain: No evidence of acute infarction, hemorrhage, hydrocephalus, extra-axial collection or mass lesion/mass effect. There is chronic diffuse atrophy. Vascular: No hyperdense vessel or unexpected calcification. Skull: There is a subcentimeter sclerotic focus in the upper left clivus stable compared to prior exams. Patient has known multifocal osseous metastatic disease. Sinuses/Orbits: No acute finding. Other: None. CT CERVICAL SPINE FINDINGS Alignment: Normal. Patient status post prior anterior fusion of C5-6 without malalignment. Skull base and vertebrae: There are several subcentimeter sclerotic lesions throughout the cervical spine consistent with patient's known osseous metastasis from breast cancer. Soft tissues and spinal canal: No prevertebral fluid or swelling. No visible canal hematoma. Disc levels: There are narrowed intervertebral spaces throughout cervical spine consistent with degenerative joint changes. Upper chest: Negative. Other: None. IMPRESSION: No focal acute intracranial abnormality identified. Focal sclerotic focus in the upper left clivus, and throughout the cervical spine consistent with patient's known osseous metastasis from breast cancer. No acute fracture or dislocation of cervical spine. Electronically Signed   By: Abelardo Diesel M.D.   On: 05/10/2018 14:16   Ct Cervical Spine Wo Contrast  Result Date: 05/10/2018 CLINICAL DATA:  Status post fall in the bathroom this morning. Patient has a history of stage IV breast cancer. Patient complains pain of the neck and bilateral arms and nausea. EXAM: CT HEAD WITHOUT CONTRAST CT CERVICAL SPINE WITHOUT CONTRAST TECHNIQUE: Multidetector CT imaging of  the head and cervical spine was performed following the standard protocol without intravenous contrast. Multiplanar CT image reconstructions of the cervical spine were also generated. COMPARISON:  MRI of the brain February 23, 2018, head CT April 25, 2018 FINDINGS: CT HEAD FINDINGS Brain: No evidence of acute infarction, hemorrhage, hydrocephalus, extra-axial collection or mass lesion/mass effect. There is chronic diffuse atrophy. Vascular: No hyperdense vessel or unexpected calcification. Skull: There is a subcentimeter sclerotic focus in the upper left clivus stable compared to prior exams. Patient has known multifocal osseous metastatic disease. Sinuses/Orbits: No acute finding. Other: None. CT CERVICAL SPINE FINDINGS Alignment: Normal. Patient status post prior anterior fusion of C5-6  without malalignment. Skull base and vertebrae: There are several subcentimeter sclerotic lesions throughout the cervical spine consistent with patient's known osseous metastasis from breast cancer. Soft tissues and spinal canal: No prevertebral fluid or swelling. No visible canal hematoma. Disc levels: There are narrowed intervertebral spaces throughout cervical spine consistent with degenerative joint changes. Upper chest: Negative. Other: None. IMPRESSION: No focal acute intracranial abnormality identified. Focal sclerotic focus in the upper left clivus, and throughout the cervical spine consistent with patient's known osseous metastasis from breast cancer. No acute fracture or dislocation of cervical spine. Electronically Signed   By: Abelardo Diesel M.D.   On: 05/10/2018 14:16   Dg Shoulder Left  Result Date: 05/10/2018 CLINICAL DATA:  Left shoulder pain secondary to a fall in the bathroom today. EXAM: LEFT SHOULDER - 2+ VIEW COMPARISON:  MRI dated 02/01/2018 and radiographs dated 02/15/2015 FINDINGS: There is no acute fracture. No dislocation. Old nonunion fracture of the base of the acromion. IMPRESSION: No acute  abnormality. Old nonunion fracture of the base of the acromion. Electronically Signed   By: Lorriane Shire M.D.   On: 05/10/2018 13:51    Procedures Procedures (including critical care time)  Medications Ordered in ED Medications  0.9 %  sodium chloride infusion ( Intravenous New Bag/Given 05/10/18 1238)     Initial Impression / Assessment and Plan / ED Course  I have reviewed the triage vital signs and the nursing notes.  Pertinent labs & imaging results that were available during my care of the patient were reviewed by me and considered in my medical decision making (see chart for details).     Repeat head CT due to the injury without any acute findings.  C-spine has no acute fractures does show the pre-existing bony mets to that area.  There is no evidence of any new lesions in the brain.  Labs without significant abnormality.  Patient has follow-up with hematology oncology clinic on January 8.  Patient had her last chemo treatment here in December.  She gets it every 21 days.  No explanation for the confusion hematology oncology is aware of this it is not related to today's fall.  There was no loss of consciousness.  Patient wants to go home which is appropriate.  Final Clinical Impressions(s) / ED Diagnoses   Final diagnoses:  Fall, initial encounter  Injury of head, initial encounter  Contusion of right elbow, initial encounter  Injury of left shoulder, initial encounter    ED Discharge Orders    None       Fredia Sorrow, MD 05/10/18 1537    Fredia Sorrow, MD 05/10/18 1546

## 2018-05-10 NOTE — ED Notes (Signed)
Pt back from radiology 

## 2018-05-10 NOTE — ED Notes (Signed)
Family at bedside. 

## 2018-05-10 NOTE — Telephone Encounter (Signed)
Patient husband called stating that [name] fell this morning while trying to sit on the commode. She hit her head and it took off a spot of skin and hair. She has been lethargic, not oriented to time, confused for the past few days. I told him to take her to the ER immediately for evaluation. I advised him to call the rescue squad to come get her and he said he would bring her to the ER.  I have called report to the ER triage nurse.

## 2018-05-10 NOTE — ED Triage Notes (Signed)
Pt was about to use the bathroom this morning and states she fell. Diagnosed with stage 4 metastatic breast cancer. Is complaining of pain in the right arm, left arm, and neck. Is nauseated as well.

## 2018-05-12 ENCOUNTER — Encounter (HOSPITAL_COMMUNITY): Payer: Self-pay | Admitting: *Deleted

## 2018-05-12 DIAGNOSIS — C50919 Malignant neoplasm of unspecified site of unspecified female breast: Secondary | ICD-10-CM

## 2018-05-12 NOTE — Progress Notes (Signed)
Patient's husband called today. He is still concerned about [name]'s confusion. He states that she is hallucinating and talking to people that aren't in the room, she is confusion and disoriented to time, she has lost the ability to feed herself in an appropriate amount of time, she is not any worse than she was on Tuesday but he is just concerned because this behavior is new to her within the last week.    After talking with Dr. Delton Coombes, patient is going to come in for labs and office visit. Patient's husband is aware of appointment. Orders have been placed.

## 2018-05-13 ENCOUNTER — Emergency Department (HOSPITAL_COMMUNITY): Payer: Medicare Other

## 2018-05-13 ENCOUNTER — Other Ambulatory Visit (HOSPITAL_COMMUNITY): Payer: Self-pay | Admitting: *Deleted

## 2018-05-13 ENCOUNTER — Encounter (HOSPITAL_COMMUNITY): Payer: Self-pay | Admitting: Hematology

## 2018-05-13 ENCOUNTER — Encounter (HOSPITAL_COMMUNITY): Payer: Self-pay | Admitting: Emergency Medicine

## 2018-05-13 ENCOUNTER — Inpatient Hospital Stay (HOSPITAL_COMMUNITY): Payer: Medicare Other | Attending: Adult Health

## 2018-05-13 ENCOUNTER — Inpatient Hospital Stay: Admission: AD | Admit: 2018-05-13 | Payer: Medicare Other | Source: Ambulatory Visit | Admitting: Internal Medicine

## 2018-05-13 ENCOUNTER — Inpatient Hospital Stay (HOSPITAL_COMMUNITY)
Admission: EM | Admit: 2018-05-13 | Discharge: 2018-05-16 | DRG: 441 | Disposition: A | Discharge status: Home Health | Payer: Medicare Other | Attending: Internal Medicine | Admitting: Internal Medicine

## 2018-05-13 ENCOUNTER — Other Ambulatory Visit: Payer: Self-pay

## 2018-05-13 ENCOUNTER — Inpatient Hospital Stay (HOSPITAL_COMMUNITY): Payer: Medicare Other | Attending: Hematology | Admitting: Hematology

## 2018-05-13 VITALS — BP 124/68 | HR 90 | Temp 98.0°F | Resp 18 | Wt 268.4 lb

## 2018-05-13 DIAGNOSIS — Z809 Family history of malignant neoplasm, unspecified: Secondary | ICD-10-CM | POA: Insufficient documentation

## 2018-05-13 DIAGNOSIS — Z86718 Personal history of other venous thrombosis and embolism: Secondary | ICD-10-CM

## 2018-05-13 DIAGNOSIS — Z801 Family history of malignant neoplasm of trachea, bronchus and lung: Secondary | ICD-10-CM

## 2018-05-13 DIAGNOSIS — Z170 Estrogen receptor positive status [ER+]: Secondary | ICD-10-CM | POA: Insufficient documentation

## 2018-05-13 DIAGNOSIS — R443 Hallucinations, unspecified: Secondary | ICD-10-CM | POA: Diagnosis not present

## 2018-05-13 DIAGNOSIS — F329 Major depressive disorder, single episode, unspecified: Secondary | ICD-10-CM | POA: Diagnosis not present

## 2018-05-13 DIAGNOSIS — Z9221 Personal history of antineoplastic chemotherapy: Secondary | ICD-10-CM

## 2018-05-13 DIAGNOSIS — Z8042 Family history of malignant neoplasm of prostate: Secondary | ICD-10-CM | POA: Insufficient documentation

## 2018-05-13 DIAGNOSIS — G894 Chronic pain syndrome: Secondary | ICD-10-CM | POA: Diagnosis present

## 2018-05-13 DIAGNOSIS — Z87891 Personal history of nicotine dependence: Secondary | ICD-10-CM | POA: Insufficient documentation

## 2018-05-13 DIAGNOSIS — F419 Anxiety disorder, unspecified: Secondary | ICD-10-CM | POA: Diagnosis present

## 2018-05-13 DIAGNOSIS — Z86010 Personal history of colonic polyps: Secondary | ICD-10-CM | POA: Insufficient documentation

## 2018-05-13 DIAGNOSIS — C787 Secondary malignant neoplasm of liver and intrahepatic bile duct: Secondary | ICD-10-CM

## 2018-05-13 DIAGNOSIS — K7682 Hepatic encephalopathy: Secondary | ICD-10-CM | POA: Diagnosis present

## 2018-05-13 DIAGNOSIS — C7931 Secondary malignant neoplasm of brain: Secondary | ICD-10-CM | POA: Diagnosis present

## 2018-05-13 DIAGNOSIS — B370 Candidal stomatitis: Secondary | ICD-10-CM

## 2018-05-13 DIAGNOSIS — Z79899 Other long term (current) drug therapy: Secondary | ICD-10-CM

## 2018-05-13 DIAGNOSIS — K219 Gastro-esophageal reflux disease without esophagitis: Secondary | ICD-10-CM | POA: Diagnosis present

## 2018-05-13 DIAGNOSIS — K7290 Hepatic failure, unspecified without coma: Principal | ICD-10-CM | POA: Diagnosis present

## 2018-05-13 DIAGNOSIS — Z8041 Family history of malignant neoplasm of ovary: Secondary | ICD-10-CM

## 2018-05-13 DIAGNOSIS — Z5112 Encounter for antineoplastic immunotherapy: Secondary | ICD-10-CM | POA: Insufficient documentation

## 2018-05-13 DIAGNOSIS — Z806 Family history of leukemia: Secondary | ICD-10-CM

## 2018-05-13 DIAGNOSIS — C50919 Malignant neoplasm of unspecified site of unspecified female breast: Secondary | ICD-10-CM | POA: Diagnosis present

## 2018-05-13 DIAGNOSIS — C7951 Secondary malignant neoplasm of bone: Secondary | ICD-10-CM | POA: Diagnosis present

## 2018-05-13 DIAGNOSIS — N390 Urinary tract infection, site not specified: Secondary | ICD-10-CM | POA: Diagnosis not present

## 2018-05-13 DIAGNOSIS — Z800 Family history of malignant neoplasm of digestive organs: Secondary | ICD-10-CM

## 2018-05-13 DIAGNOSIS — Z923 Personal history of irradiation: Secondary | ICD-10-CM

## 2018-05-13 DIAGNOSIS — Z79891 Long term (current) use of opiate analgesic: Secondary | ICD-10-CM

## 2018-05-13 DIAGNOSIS — G9340 Encephalopathy, unspecified: Secondary | ICD-10-CM | POA: Diagnosis not present

## 2018-05-13 DIAGNOSIS — R410 Disorientation, unspecified: Secondary | ICD-10-CM | POA: Diagnosis not present

## 2018-05-13 DIAGNOSIS — G9341 Metabolic encephalopathy: Secondary | ICD-10-CM | POA: Diagnosis present

## 2018-05-13 DIAGNOSIS — E876 Hypokalemia: Secondary | ICD-10-CM | POA: Diagnosis present

## 2018-05-13 DIAGNOSIS — C7981 Secondary malignant neoplasm of breast: Secondary | ICD-10-CM | POA: Diagnosis not present

## 2018-05-13 DIAGNOSIS — E860 Dehydration: Secondary | ICD-10-CM | POA: Diagnosis present

## 2018-05-13 DIAGNOSIS — Z7189 Other specified counseling: Secondary | ICD-10-CM

## 2018-05-13 DIAGNOSIS — Z79811 Long term (current) use of aromatase inhibitors: Secondary | ICD-10-CM | POA: Insufficient documentation

## 2018-05-13 DIAGNOSIS — I82409 Acute embolism and thrombosis of unspecified deep veins of unspecified lower extremity: Secondary | ICD-10-CM | POA: Diagnosis present

## 2018-05-13 DIAGNOSIS — M549 Dorsalgia, unspecified: Secondary | ICD-10-CM | POA: Diagnosis not present

## 2018-05-13 DIAGNOSIS — R4182 Altered mental status, unspecified: Secondary | ICD-10-CM | POA: Diagnosis not present

## 2018-05-13 DIAGNOSIS — F32A Depression, unspecified: Secondary | ICD-10-CM | POA: Diagnosis present

## 2018-05-13 DIAGNOSIS — C799 Secondary malignant neoplasm of unspecified site: Secondary | ICD-10-CM

## 2018-05-13 DIAGNOSIS — Z807 Family history of other malignant neoplasms of lymphoid, hematopoietic and related tissues: Secondary | ICD-10-CM

## 2018-05-13 DIAGNOSIS — R32 Unspecified urinary incontinence: Secondary | ICD-10-CM

## 2018-05-13 DIAGNOSIS — Z833 Family history of diabetes mellitus: Secondary | ICD-10-CM

## 2018-05-13 DIAGNOSIS — Z7901 Long term (current) use of anticoagulants: Secondary | ICD-10-CM | POA: Insufficient documentation

## 2018-05-13 DIAGNOSIS — Z853 Personal history of malignant neoplasm of breast: Secondary | ICD-10-CM

## 2018-05-13 DIAGNOSIS — C50912 Malignant neoplasm of unspecified site of left female breast: Secondary | ICD-10-CM | POA: Insufficient documentation

## 2018-05-13 DIAGNOSIS — E7220 Disorder of urea cycle metabolism, unspecified: Secondary | ICD-10-CM | POA: Diagnosis present

## 2018-05-13 DIAGNOSIS — Z808 Family history of malignant neoplasm of other organs or systems: Secondary | ICD-10-CM

## 2018-05-13 DIAGNOSIS — I427 Cardiomyopathy due to drug and external agent: Secondary | ICD-10-CM | POA: Diagnosis present

## 2018-05-13 DIAGNOSIS — Z981 Arthrodesis status: Secondary | ICD-10-CM

## 2018-05-13 LAB — CBC WITH DIFFERENTIAL/PLATELET
Abs Immature Granulocytes: 0.04 10*3/uL (ref 0.00–0.07)
Basophils Absolute: 0 10*3/uL (ref 0.0–0.1)
Basophils Relative: 0 %
EOS ABS: 0 10*3/uL (ref 0.0–0.5)
Eosinophils Relative: 0 %
HEMATOCRIT: 28.4 % — AB (ref 36.0–46.0)
Hemoglobin: 8.2 g/dL — ABNORMAL LOW (ref 12.0–15.0)
Immature Granulocytes: 1 %
LYMPHS ABS: 0.6 10*3/uL — AB (ref 0.7–4.0)
Lymphocytes Relative: 14 %
MCH: 24 pg — ABNORMAL LOW (ref 26.0–34.0)
MCHC: 28.9 g/dL — ABNORMAL LOW (ref 30.0–36.0)
MCV: 83.3 fL (ref 80.0–100.0)
Monocytes Absolute: 0.6 10*3/uL (ref 0.1–1.0)
Monocytes Relative: 13 %
Neutro Abs: 3.2 10*3/uL (ref 1.7–7.7)
Neutrophils Relative %: 72 %
Platelets: 152 10*3/uL (ref 150–400)
RBC: 3.41 MIL/uL — ABNORMAL LOW (ref 3.87–5.11)
RDW: 20.4 % — ABNORMAL HIGH (ref 11.5–15.5)
WBC: 4.4 10*3/uL (ref 4.0–10.5)
nRBC: 0.7 % — ABNORMAL HIGH (ref 0.0–0.2)

## 2018-05-13 LAB — URINALYSIS, ROUTINE W REFLEX MICROSCOPIC
Bacteria, UA: NONE SEEN
Bilirubin Urine: NEGATIVE
Glucose, UA: NEGATIVE mg/dL
Hgb urine dipstick: NEGATIVE
Ketones, ur: NEGATIVE mg/dL
Nitrite: NEGATIVE
PH: 5 (ref 5.0–8.0)
Protein, ur: NEGATIVE mg/dL
SPECIFIC GRAVITY, URINE: 1.024 (ref 1.005–1.030)

## 2018-05-13 LAB — AMMONIA: Ammonia: 68 umol/L — ABNORMAL HIGH (ref 9–35)

## 2018-05-13 LAB — COMPREHENSIVE METABOLIC PANEL
ALBUMIN: 3.4 g/dL — AB (ref 3.5–5.0)
ALT: 25 U/L (ref 0–44)
AST: 63 U/L — ABNORMAL HIGH (ref 15–41)
Alkaline Phosphatase: 141 U/L — ABNORMAL HIGH (ref 38–126)
Anion gap: 9 (ref 5–15)
BILIRUBIN TOTAL: 0.8 mg/dL (ref 0.3–1.2)
BUN: 6 mg/dL (ref 6–20)
CO2: 29 mmol/L (ref 22–32)
Calcium: 8.9 mg/dL (ref 8.9–10.3)
Chloride: 100 mmol/L (ref 98–111)
Creatinine, Ser: 0.85 mg/dL (ref 0.44–1.00)
GFR calc Af Amer: >60 mL/min (ref >60)
GFR calc non Af Amer: >60 mL/min (ref >60)
Glucose, Bld: 135 mg/dL — ABNORMAL HIGH (ref 70–99)
POTASSIUM: 3.3 mmol/L — AB (ref 3.5–5.1)
Sodium: 138 mmol/L (ref 135–145)
Total Protein: 7.1 g/dL (ref 6.5–8.1)

## 2018-05-13 LAB — MAGNESIUM: MAGNESIUM: 2.1 mg/dL (ref 1.7–2.4)

## 2018-05-13 LAB — TROPONIN I: Troponin I: <0.03 ng/mL (ref <0.03)

## 2018-05-13 LAB — I-STAT CG4 LACTIC ACID, ED: Lactic Acid, Venous: 1.8 mmol/L (ref 0.5–1.9)

## 2018-05-13 LAB — PHOSPHORUS: Phosphorus: 3.4 mg/dL (ref 2.5–4.6)

## 2018-05-13 MED ORDER — MAGIC MOUTHWASH
5.0000 mL | Freq: Once | ORAL | Status: AC
  Administered 2018-05-13: 5 mL via ORAL
  Filled 2018-05-13: qty 5

## 2018-05-13 MED ORDER — HEPARIN SODIUM (PORCINE) 5000 UNIT/ML IJ SOLN: 5000.0000 [IU] | Freq: Three times a day (TID) | INTRAMUSCULAR | Status: DC

## 2018-05-13 MED ORDER — LORAZEPAM 2 MG/ML IJ SOLN
1.0000 mg | Freq: Once | INTRAMUSCULAR | Status: AC
  Administered 2018-05-13: 1 mg via INTRAVENOUS

## 2018-05-13 MED ORDER — RIVAROXABAN 20 MG PO TABS
20.0000 mg | ORAL_TABLET | Freq: Every day | ORAL | Status: DC
  Administered 2018-05-14 – 2018-05-15 (×2): 20 mg via ORAL
  Filled 2018-05-13 (×2): qty 1

## 2018-05-13 MED ORDER — POTASSIUM CHLORIDE IN NACL 20-0.9 MEQ/L-% IV SOLN
INTRAVENOUS | Status: AC
  Administered 2018-05-13: 23:00:00 via INTRAVENOUS

## 2018-05-13 MED ORDER — LORAZEPAM 2 MG/ML IJ SOLN
INTRAMUSCULAR | Status: AC
  Filled 2018-05-13: qty 1

## 2018-05-13 MED ORDER — ONDANSETRON HCL 4 MG PO TABS: 4.0000 mg | ORAL_TABLET | Freq: Four times a day (QID) | ORAL | Status: DC | PRN

## 2018-05-13 MED ORDER — ONDANSETRON HCL 4 MG/2ML IJ SOLN
4.0000 mg | Freq: Four times a day (QID) | INTRAMUSCULAR | Status: DC | PRN
  Administered 2018-05-14 – 2018-05-16 (×2): 4 mg via INTRAVENOUS
  Filled 2018-05-13 (×2): qty 2

## 2018-05-13 MED ORDER — LACTULOSE ENEMA
300.0000 mL | Freq: Once | ORAL | Status: DC | PRN
  Filled 2018-05-13: qty 300

## 2018-05-13 MED ORDER — PANTOPRAZOLE SODIUM 40 MG PO TBEC
40.0000 mg | DELAYED_RELEASE_TABLET | Freq: Two times a day (BID) | ORAL | Status: DC
  Administered 2018-05-14 – 2018-05-16 (×5): 40 mg via ORAL
  Filled 2018-05-13 (×5): qty 1

## 2018-05-13 MED ORDER — POTASSIUM CHLORIDE CRYS ER 20 MEQ PO TBCR
40.0000 meq | EXTENDED_RELEASE_TABLET | Freq: Every day | ORAL | Status: DC
  Administered 2018-05-15 – 2018-05-16 (×2): 40 meq via ORAL
  Filled 2018-05-13 (×2): qty 2
  Filled 2018-05-13: qty 4

## 2018-05-13 MED ORDER — LORAZEPAM 2 MG/ML IJ SOLN
2.0000 mg | Freq: Once | INTRAMUSCULAR | Status: AC
  Administered 2018-05-13: 2 mg via INTRAVENOUS
  Filled 2018-05-13: qty 1

## 2018-05-13 MED ORDER — MAGIC MOUTHWASH: 10.0000 mL | Freq: Four times a day (QID) | ORAL | Status: DC | PRN

## 2018-05-13 MED ORDER — LACTULOSE 10 GM/15ML PO SOLN
30.0000 g | ORAL | Status: AC
  Administered 2018-05-13: 30 g via ORAL
  Filled 2018-05-13: qty 60

## 2018-05-13 MED ORDER — GADOBUTROL 1 MMOL/ML IV SOLN: 10.0000 mL | Freq: Once | INTRAVENOUS | Status: DC | PRN

## 2018-05-13 NOTE — ED Provider Notes (Addendum)
[facility] EMERGENCY DEPARTMENT Provider Note   CSN: [id number] Arrival date & time: 05/13/18  1517     History   Chief Complaint Chief Complaint  Patient presents with  . Altered Mental Status    HPI [name] is a 53 y.o. female.  HPI  The patient is a 53 year old female anticoagulated on Xarelto with a known history of stage IV breast cancer which has metastasized to her brain, bones of the spine and the liver.  She has had multiple evaluations and is followed closely at the cancer center.  She was sent here today from the cancer center at the request of the oncologist Dr. Raliegh Ip, with a complaint of generalized and progressive weakness and confusion.  The family report that over the last week she has had some progressive generalized weakness with increasing episodes of urinary incontinence and is now had some difficulty ambulating because of bilateral leg weakness.  For the last 3 days she has been very prominently confused, she does not know where she is at home, she is now having some visual hallucinations as well.  She had a fall approximately 4 days ago at which time she had a CT scan of the brain and the cervical spine which were not remarkable for any signs of trauma or hemorrhage.  She had an MRI of her spine on November 1 which showed that there was an extradural mass at the level of T10 and has been undergoing radiation for the last month.  Her last dose was approximately 1 week ago.  Her last dose of chemotherapy which according to the oncologist is a antibody late chemotherapy was on December 27.  He states this should not cause confusion or weakness.  Vital signs were noted to be unremarkable in the office, she was sent here for further evaluation.  The patient is having some difficulty giving me history and a level 5 caveat applies secondary to confusion and encephalopathy.  Multiple bits of information were obtained from the chart including prior MRIs and evaluations through  the oncology service.  The family at the bedside deny that there is been any fever vomiting diarrhea or blood in the stools, no new rashes, decreased appetite which is progressive as well.  Past Medical History:  Diagnosis Date  . Anticoagulated    xarelto  . Anxiety   . Breast cancer metastasized to multiple sites [facility]    liver, brain, and bone  . Breast cancer, stage 4 [facility]) oncologist-  dr Larene [place] ([facility])   dx 12/ 2015 -- breast cancer Stage 4,  ER/HER2 +,  w/  liver, brain and  bone mets/  chemotherapy and radiation therapy  . Chronic pain syndrome    secondary to cancer   . Depression   . Drug-induced cardiomyopathy ([place])    per last MUGA (08-08-2015), ef 56.5/ per last echo 05-18-2014 ef 60-65%  . Family history of prostate cancer   . GERD (gastroesophageal reflux disease)   . History of colon polyps    07-13-2013  benign  . History of DVT (deep vein thrombosis)    07-09-2014  upper right extremity-  RIJ and right subclavian--  resolved  . History of gastritis    erosive  . History of pneumonia    HCAP 06-07-2015--  resolved per cxr 07-04-2015  . History of radiation therapy    12 fractions to L3 - S3, 30Gy and left spacula 20Gy (06-20-2014 to 07-10-2014) //  whole brain rxt (10-09-2014  to 10-26-2014)  . History of small bowel obstruction    S/P RESECTION 2008  . Migraine   . PONV (postoperative nausea and vomiting)    pt states scope patch does well    Patient Active Problem List   Diagnosis Date Noted  . Hepatic encephalopathy ([place]) 05/13/2018  . Mouth sores 03/18/2016  . Esophageal reflux   . Metastatic breast cancer ([place])   . HCAP (healthcare-associated pneumonia) 06/02/2015  . Pneumonia 06/02/2015  . Drug-induced cardiomyopathy ([place]) 02/15/2015  . Brain metastases ([place]) 01/29/2015  . Genetic testing 09/05/2014  . Family history of prostate cancer   . GERD (gastroesophageal reflux disease) 07/16/2014  . DVT (deep venous thrombosis) ([place])  07/09/2014  . Bone metastases ([place]) 05/23/2014  . Breast cancer, stage 4 ([place]) 05/16/2014  . Hyperlipidemia 04/23/2014  . Depression 04/23/2014    Past Surgical History:  Procedure Laterality Date  . BREAST REDUCTION SURGERY  03/17/2011   Procedure: MAMMARY REDUCTION BILATERAL (BREAST);  Surgeon: Mary A Contogiannis;  Location: [facility];  Service: Plastics;  Laterality: Bilateral;  . CATARACT EXTRACTION W/ INTRAOCULAR LENS  IMPLANT, BILATERAL  2008  . CERVICAL FUSION  2003   C5 -- C6  . COLONOSCOPY N/A 07/13/2013   Procedure: COLONOSCOPY;  Surgeon: Rogene Houston, MD;  [facility];  Service: Endoscopy;  Laterality: N/A;  930  . COLONOSCOPY N/A 11/26/2014   Procedure: COLONOSCOPY;  Surgeon: Rogene Houston, MD;  [facility];  Service: Endoscopy;  Laterality: N/A;  730  . D & C HYSTEROSCOPY/  RESECTION ENDOMETRIAL MASS/  [place] ENDOMETRIAL ABLATION  04-11-2010  . DX LAPAROSCOPY W/ PARTIAL SMALL BOWEL RESECTION AND APPENDECTOMY  04-13-2007  . ESOPHAGOGASTRODUODENOSCOPY N/A 05/25/2014   Procedure: ESOPHAGOGASTRODUODENOSCOPY (EGD);  Surgeon: Rogene Houston, MD;  [facility];  Service: Endoscopy;  Laterality: N/A;  155  . KNEE ARTHROSCOPY Right 2005  . LAPAROSCOPIC ASSISTED VAGINAL HYSTERECTOMY  10-13-2010   w/ Bx Left Fallopian tube and Aspiration Right Ovarian Cyst  . LAPAROSCOPIC CHOLECYSTECTOMY  11-17-2002  . LAPAROSCOPIC SALPINGO OOPHERECTOMY Bilateral 08/26/2015   Procedure: LAPAROSCOPIC SALPINGO OOPHORECTOMY, bilateral;  Surgeon: Everlene Farrier, MD;  Location: [place];  Service: Gynecology;  Laterality: Bilateral;  . PORTACATH PLACEMENT  05-17-2014  . REDUCTION MAMMAPLASTY    . SHOULDER ARTHROSCOPY WITH ROTATOR CUFF REPAIR Right 2002  . TRANSTHORACIC ECHOCARDIOGRAM  05-18-2014   ef 60-65%//   last MUGA  (08-08-2015)  ef 56.6%       OB History   No obstetric history on file.      Home Medications    Prior  to Admission medications   Medication Sig Start Date End Date Taking? Authorizing Provider  acetaminophen (TYLENOL) 325 MG tablet Take 650 mg by mouth every 6 (six) hours as needed for moderate pain or headache.    [provider]  calcium carbonate (TUMS - DOSED IN MG ELEMENTAL CALCIUM) 500 MG chewable tablet Chew 2 tablets by mouth daily.     [provider]  Calcium-Phosphorus-Vitamin D (CALCIUM/D3 ADULT GUMMIES PO) Take 2 tablets by mouth 2 (two) times daily. dosage of calcium 126m and vitamin d 10067m   [provider]  ciprofloxacin (CIPRO) 500 MG tablet Take 1 tablet (500 mg total) by mouth 2 (two) times daily. 04/22/18   KaDerek JackMD  cyclobenzaprine (FLEXERIL) 10 MG tablet cyclobenzaprine 10 mg tablet  Take 1 tablet(s) q 6-8 hrs as needed for spasm    [provider]  desvenlafaxine (PRISTIQ) 100 MG 24 hr tablet Take 1 tablet (100 mg total) by mouth daily. 05/09/18   Lockamy, Randi L, NP-C  dicyclomine (BENTYL) 20 MG tablet Take 1 tablet (20 mg total) by mouth 4 (four) times daily -  before meals and at bedtime. 11/17/17   Lockamy, Randi L, NP-C  diphenhydrAMINE (BENADRYL) 25 mg capsule Take 25 mg by mouth at bedtime as needed for itching.     [provider]  diphenoxylate-atropine (LOMOTIL) 2.5-0.025 MG tablet Take 2 tablets by mouth 4 (four) times daily as needed for diarrhea or loose stools. 11/17/17   Lockamy, Randi L, NP-C  DUREZOL 0.05 % EMUL INSTILL ONE DROP IN THE RIGHT EYE TWICE DAILY TIME ONE WEEK THEN ONCE DAILY 10/07/17   [provider]  furosemide (LASIX) 20 MG tablet TAKE 1 TABLET BY MOUTH DAILY 04/22/18   Lockamy, Randi L, NP-C  gabapentin (NEURONTIN) 300 MG capsule Take 2 capsules (600 mg total) by mouth at bedtime. 05/03/18   Higgs, Mathis Dad, MD  lidocaine-prilocaine (EMLA) cream Apply 1 application topically daily as needed (apply to port before chemo). 04/28/17   Holley Bouche, NP  lidocaine-prilocaine  (EMLA) cream Apply to affected area once 03/03/18   Higgs, Mathis Dad, MD  LORazepam (ATIVAN) 0.5 MG tablet TAKE 1 TABLET BY MOUTH EVERY 6 HOURS AS NEEDED FOR ANXIETY 05/03/18   Higgs, Mathis Dad, MD  Melatonin 10 MG CAPS Take 1 tablet by mouth at bedtime. Pt states she takes one tablet at night for sleep     [provider]  morphine (MS CONTIN) 30 MG 12 hr tablet TAKE 1 TABLET BY MOUTH EVERY TWELVE HOURS 04/25/18   Higgs, Mathis Dad, MD  NARCAN 4 MG/0.1ML LIQD nasal spray kit CALL 911. ADMINISTER A SINGLE SPRAY OF NARCAN IN ONE NOSTRIL, REPEAT EVERY 3 MINUTES AS NEEDED IF NO OR MINIMAL RESPONSE 04/06/17   [provider]  ondansetron (ZOFRAN) 4 MG tablet Zofran 4 mg tablet  1 q 6-8 hrs as needed for post op nausea    [provider]  ondansetron (ZOFRAN) 8 MG tablet TAKE ONE TABLET BY MOUTH EVERY 8 HOURS AS NEEDED FOR NAUSEA AND VOMITING 11/15/17   [provider]  oxyCODONE (OXY IR/ROXICODONE) 5 MG immediate release tablet Take 1-2 tablets (5-10 mg total) by mouth every 4 (four) hours as needed for moderate pain. 08/10/16   Baird Cancer, PA-C  pantoprazole (PROTONIX) 40 MG tablet Take 1 tablet (40 mg total) by mouth every 12 (twelve) hours. 03/08/18   Higgs, Mathis Dad, MD  potassium chloride SA (K-DUR,KLOR-CON) 20 MEQ tablet Take 2 tablets (40 mEq total) by mouth daily. 04/22/18   Higgs, Mathis Dad, MD  promethazine (PHENERGAN) 25 MG tablet TAKE 1 TABLET BY MOUTH EVERY SIX HOURS AS NEEDED FOR NAUSEA OR VOMITING 12/23/16   Holley Bouche, NP  rivaroxaban (XARELTO) 20 MG TABS tablet TAKE 1 TABLET BY MOUTH EVERY DAY WITH SUPPER 02/11/18   Lockamy, Randi L, NP-C  spironolactone (ALDACTONE) 25 MG tablet Take 1 tablet (25 mg total) by mouth daily. 10/08/17   Derek Jack, MD  Zoledronic Acid (ZOMETA IV) Inject into the vein every 3 (three) months.    [provider]  zolpidem (AMBIEN) 10 MG tablet TAKE 1 TABLET BY MOUTH AT BEDTIME AS NEEDED FOR SLEEP 04/22/18   Higgs,  Mathis Dad, MD    Family History Family History  Problem Relation Age of Onset  . Diabetes Father   . Heart attack Maternal Grandmother  30       multiple over lifetime.  . Cancer Maternal Grandmother 72       NOS  . Prostate cancer Maternal Grandfather        dx in his 14s  . Lung cancer Paternal Grandfather        dx <50  . Lymphoma Maternal Aunt        dx in her 52s  . Melanoma Cousin 36       maternal first cousin  . Brain cancer Cousin        paternal first cousin dx under 36  . Prostate cancer Other        MGF's father  . Colon cancer Other        MGM's mother    Social History Social History   Tobacco Use  . Smoking status: Former Smoker    Types: Cigarettes    Last attempt to quit: 08/20/1994    Years since quitting: 23.7  . Smokeless tobacco: Never Used  Substance Use Topics  . Alcohol use: Yes    Comment: Occasionally  . Drug use: No     Allergies   Patient has no known allergies.   Review of Systems Review of Systems  Unable to perform ROS: Mental status change     Physical Exam Updated Vital Signs BP (!) 117/50 (BP Location: Left Arm)   Pulse 81   Temp 98 F (36.7 C) (Oral)   Resp 18   Ht 1.753 m ('5\' 9"' )   Wt 121 kg   SpO2 98%   BMI 39.39 kg/m   Physical Exam Vitals signs and nursing note reviewed.  Constitutional:      General: She is not in acute distress.    Appearance: She is well-developed.     Comments: Mildly somnolent, arousable and answers questions  HENT:     Head: Normocephalic and atraumatic.     Comments: The patient has no signs of hematomas or abrasions or contusions or lacerations to the head.    Mouth/Throat:     Pharynx: No oropharyngeal exudate.     Comments: Slight angular colitis present, mucous membranes mildly dehydrated Eyes:     General: No scleral icterus.       Right eye: No discharge.        Left eye: No discharge.     Conjunctiva/sclera: Conjunctivae normal.     Pupils: Pupils are equal, round, and  reactive to light.  Neck:     Musculoskeletal: Normal range of motion and neck supple.     Thyroid: No thyromegaly.     Vascular: No JVD.  Cardiovascular:     Rate and Rhythm: Normal rate and regular rhythm.     Pulses: Normal pulses.     Heart sounds: Murmur ( Soft systolic murmur present) present. No friction rub. No gallop.   Pulmonary:     Effort: Pulmonary effort is normal. No respiratory distress.     Breath sounds: Normal breath sounds. No wheezing or rales.  Abdominal:     General: Bowel sounds are normal. There is no distension.     Palpations: Abdomen is soft. There is no mass.     Tenderness: There is no abdominal tenderness.  Musculoskeletal: Normal range of motion.        General: No tenderness.  Lymphadenopathy:     Cervical: No cervical adenopathy.  Skin:    General: Skin is warm and dry.     Findings: No erythema or  rash.  Neurological:     Coordination: Coordination normal.     Comments: The patient is able to lift both arms, she has had prior left shoulder surgery so is limited in her range of motion but has what appears to be symmetrical strength at the shoulders elbows wrists and hands bilaterally.  She has the ability to straight leg raise bilaterally with 5 out of 5 strength and can hold her legs normally in the ER.  She is able to dorsiflex and plantarflex at the ankles as well.  She is slightly somnolent, her eyes are closed for most of the interview but she does open them and follow all commands and her cranial nerves III through XII appear to be intact.  She does have a slight confusion about some of the events but does not appear to be hallucinating.  Psychiatric:        Behavior: Behavior normal.      ED Treatments / Results  Labs (all labs ordered are listed, but only abnormal results are displayed) Labs Reviewed  URINE CULTURE  TROPONIN I  MAGNESIUM  PHOSPHORUS  I-STAT CG4 LACTIC ACID, ED    EKG None  Radiology Mr Brain Wo  Contrast  Result Date: 05/13/2018 CLINICAL DATA:  Metastatic breast cancer with worsening confusion. EXAM: MRI HEAD WITHOUT CONTRAST TECHNIQUE: Multiplanar, multiecho pulse sequences of the brain and surrounding structures were obtained without intravenous contrast. COMPARISON:  Head CT 05/10/2018 and MRI 02/23/2018 FINDINGS: The patient terminated the examination before IV contrast was administered. A complete noncontrast brain MRI was obtained. Brain: There is no evidence of acute infarct, intracranial mass effect, or extra-axial fluid collection. There is mild cerebral atrophy. 3 mm T2 hyperintense focus in the right caudate head is unchanged and corresponds to a treated metastasis. T2 hyperintensities in the cerebral white matter bilaterally are unchanged and nonspecific but may reflect mild chronic small vessel ischemic disease or post treatment changes. Evaluation for brain metastases is limited by lack of IV contrast, however no new area vasogenic edema is present. A partially empty sella and a chronic microhemorrhage in the medial left parietal lobe are unchanged. Vascular: Major intracranial vascular flow voids are preserved. Skull and upper cervical spine: Heterogeneous bone marrow signal in the clivus with a sclerotic metastasis shown on CT. Sinuses/Orbits: Bilateral cataract extraction. Paranasal sinuses and mastoid air cells are clear. Other: None. IMPRESSION: 1. No acute intracranial abnormality. 2. Limited assessment for intracranial metastatic disease as the patient terminated the examination before IV contrast was administered. No mass lesion or vasogenic edema on this unenhanced study. Electronically Signed   By: Logan Bores M.D.   On: 05/13/2018 17:34   Mr Thoracic Spine Wo Contrast  Result Date: 05/13/2018 CLINICAL DATA:  Severe back pain. EXAM: MRI THORACIC SPINE WITHOUT CONTRAST TECHNIQUE: Multiplanar, multisequence MR imaging of the thoracic spine was performed. No intravenous contrast  was administered. COMPARISON:  Earlier same day.  03/11/2018. FINDINGS: The patient could not do any better with imaging on this attempt. Again, we obtained only motion degraded noncontrast imaging. We did achieve some motion degraded axial imaging. Alignment: Normal Vertebrae: Treated metastatic disease as seen previously. Compared to the study of last November, no new lesions are evident. There is no evidence of pathologic fracture. Disease is most extensive within the T7 vertebral body, the T10 vertebral body and the T11 vertebral body. Small lesion in the posterior left corner of T9 as seen previously. Other small scattered lesions as seen previously. There is no  evidence of extraosseous disease on this exam. The canal and foramina appear sufficiently patent. No specific cause of acutely worsened pain is identified. Cord:  No cord compression or cord lesion identified. Paraspinal and other soft tissues: Otherwise negative Disc levels: No evidence of disc herniation in the thoracic region. IMPRESSION: This is again in abbreviated motion degraded exam. No contrast was achieved. Axial images are motion degraded. No new lesions are seen when compared to the study of last November. Treated metastatic lesions as outlined above. No evidence of extraosseous tumor. No evidence of pathologic fracture. No evidence of spinal cord or nerve root compression. Electronically Signed   By: Nelson Chimes M.D.   On: 05/13/2018 19:29   Mr Thoracic Spine Wo Contrast  Result Date: 05/13/2018 CLINICAL DATA:  Back pain.  Metastatic breast cancer. EXAM: MRI THORACIC SPINE WITHOUT CONTRAST TECHNIQUE: Multiplanar, multisequence MR imaging of the thoracic spine was performed. No intravenous contrast was administered. COMPARISON:  03/11/2018 FINDINGS: The patient terminated the examination prematurely. Only sagittal T1 and sagittal T2 sequences were obtained and these are motion degraded (severely so on the T1 sequence. IV contrast was not  administered. Alignment: No significant listhesis. Vertebrae: Grossly similar size of multiple bone metastases including a large sclerotic lesion in the T7 vertebral body and right pedicle and extensive lesion involving the T10 vertebral body and posterior elements. Small volume epidural tumor at T10 on the prior study is inadequately evaluated on this study though no gross epidural tumor is evident. Preserved vertebral body heights without evidence of fracture on this limited examination. Partially visualized small metastases in the upper lumbar spine, cervical spine, and ribs. Cord: Grossly normal cord signal and morphology on the motion degraded sagittal T2 sequence. Paraspinal and other soft tissues: Grossly unremarkable. Disc levels: Similar appearance of very mild thoracic spondylosis. More prominent disc bulging/broad posterior disc protrusion at L1-2 without evidence of compressive spinal stenosis. IMPRESSION: 1. Incomplete, motion degraded noncontrast examination. 2. Similar appearance of multiple bone metastases. No gross epidural tumor. Previously demonstrated small volume epidural tumor at T10 inadequately reassessed on this study. Electronically Signed   By: Logan Bores M.D.   On: 05/13/2018 18:13   Mr Lumbar Spine Wo Contrast  Result Date: 05/13/2018 CLINICAL DATA:  Followup metastatic breast cancer. EXAM: MRI LUMBAR SPINE WITHOUT CONTRAST TECHNIQUE: Multiplanar, multisequence MR imaging of the lumbar spine was performed. No intravenous contrast was administered. COMPARISON:  CT 09/21/2017. FINDINGS: The patient could not tolerate complete imaging. Sagittal T1 and T2 imaging was performed. Very motion degraded axial imaging is performed. lSegmentation: 5 lumbar type vertebral bodies presumed. Alignment:  Normal Vertebrae: Metastatic lesions scattered throughout the lumbar spine and sacrum. Most of these are small in the lumbar region, less than a cm. Exceptions to this include AA 1.5 cm metastasis  within the anterior L4 vertebral body and a metastasis within the sacrum in the S2-S3 region measuring up to 3.4 cm in size. Conus medullaris and cauda equina: Conus extends to the L1 level. Conus and cauda equina appear normal. No evidence of neural compression by tumor. Paraspinal and other soft tissues: Negative as seen. Disc levels: Central disc herniation at L1-2 that indents the thecal sac but does not appear to compress the neural structures. Shallow disc protrusion at L4-5. Facet osteoarthritis at L3-4, L4-5 and L5-S1. No apparent neural compressive stenosis. IMPRESSION: Scattered metastatic disease throughout the lumbar spine and the sacrum. No evidence of pathologic fracture or extraosseous tumor causing neural compression. Largest lesion in the lumbar  region is 1.5 cm metastasis within the anterior L4 vertebral body. Sacral disease at S2 and S3 measures up to 3.4 cm in size. Note that this examination is limited by patient discomfort and only sagittal T1 and T2 imaging was achieved. Axial imaging is severely motion degraded. Electronically Signed   By: Nelson Chimes M.D.   On: 05/13/2018 19:15   Dg Chest Port 1 View  Result Date: 05/13/2018 CLINICAL DATA:  Altered mental status.  Metastatic breast carcinoma EXAM: PORTABLE CHEST 1 VIEW COMPARISON:  Chest radiograph Sep 28, 2016; chest CT March 28, 2018 FINDINGS: Port-A-Cath tip is in the superior vena cava. No pneumothorax. No evident edema or consolidation. Heart size and pulmonary vascular normal. No adenopathy. There is postoperative change in the lower cervical region. There is an expansile lesion in the anterior right sixth rib, suspicious for metastatic focus. Thoracic spine sclerotic lesions seen on prior CT are not well seen on this portable chest radiographic examination. IMPRESSION: Apparent metastatic focus in the anterior right sixth rib. No edema or consolidation. No adenopathy demonstrable by radiography. Electronically Signed   By:  Lowella Grip III M.D.   On: 05/13/2018 16:18    Procedures .Critical Care Performed by: Noemi Chapel, MD Authorized by: Noemi Chapel, MD   Critical care provider statement:    Critical care time (minutes):  35   Critical care time was exclusive of:  Separately billable procedures and treating other patients and teaching time   Critical care was necessary to treat or prevent imminent or life-threatening deterioration of the following conditions:  Metabolic crisis   Critical care was time spent personally by me on the following activities:  Blood draw for specimens, development of treatment plan with patient or surrogate, discussions with consultants, evaluation of patient's response to treatment, examination of patient, obtaining history from patient or surrogate, ordering and performing treatments and interventions, ordering and review of laboratory studies, ordering and review of radiographic studies, pulse oximetry, re-evaluation of patient's condition and review of old charts   (including critical care time)  Medications Ordered in ED Medications  gadobutrol (GADAVIST) 1 MMOL/ML injection 10 mL (has no administration in time range)  lactulose (CHRONULAC) 10 GM/15ML solution 30 g (has no administration in time range)  LORazepam (ATIVAN) injection 2 mg (2 mg Intravenous Given 05/13/18 1801)  LORazepam (ATIVAN) injection 1 mg (1 mg Intravenous Given 05/13/18 1845)     Initial Impression / Assessment and Plan / ED Course  I have reviewed the triage vital signs and the nursing notes.  Pertinent labs & imaging results that were available during my care of the patient were reviewed by me and considered in my medical decision making (see chart for details).     It is not exactly clear what is causing the encephalopathy.  At this time I would recommend that the patient undergoes an MRI of the brain as well as the thoracic and lumbar spines given her recent fall though this seems to be  more of an encephalopathy and a global decline.  We will check for urinalysis, she likely needs to be admitted due to her progressive weakness as well.  Labs reviewed, medical record reviewed and additional history obtained from family.  Lumbar spine MRI was not completely evaluated though it did show some scattered metastatic disease through the spine and the sacrum.  There is no pathologic fractures or extraosseous tumor causing any compression.  Thoracic spine MRI was obtained showing no new lesions.  MRI of the  brain was without obvious answers.  I have performed multiple repeat evaluations, neurologic evaluations and have kept her on the cardiac monitor as long as possible.  She has required significant resources in both nursing, technician, imaging studies and consultation with the hospitalist.  The patient likely has had some encephalopathy from the elevated ammonia level which is likely related to infiltration of the liver from her metastatic disease.  I discussed the case with Dr. Olevia Bowens who will admit the patient to the hospital.  She is required multiple doses of Ativan to help potentially facilitate the MRI but continues to be agitated.  She is able to ambulate throughout the room with significant difficulty with the family's assistant but will not sit still.  She will need lactulose and admission.  Final Clinical Impressions(s) / ED Diagnoses   Final diagnoses:  Acute encephalopathy  Metastatic cancer ([place])      Noemi Chapel, MD 05/13/18 Artist Pais    Noemi Chapel, MD 05/13/18 [phone]

## 2018-05-13 NOTE — ED Notes (Signed)
Pt attempting to get out of end of bed. This tech tried to redirect and readjust pt to scoot back in the bed. Family stated "she isnt going to do that. Can she stay right here (at end of stretcher)? We will be right here and not let anything happen. RN notified.

## 2018-05-13 NOTE — H&P (Signed)
History and Physical    [name] NOT:[id number] DOB: [birth year] DOA: 05/13/2018  PCP: [name], MD   Patient coming from: Home.  I have personally briefly reviewed patient's old medical records in [place]  Chief Complaint: AMS  HPI: [name] is a 53 y.o. female with medical history significant of anxiety, depression, drug-induced cardiomyopathy, GERD, history of gastritis, history of colon polyps, history of DVT on Xarelto, history of pneumonia, migraine headaches, stage IV breast cancer with history of whole brain radiation therapy and chemotherapy who was brought to the emergency department with complaints of confusion and hallucinations for the past week.  The patient's husband states that after she got treatment in late November she started having mild confusion and forgetting things.  She would ask the same questions that were answered to her an hour before.  Then on Friday, she got chemotherapy on December 27 and this seems to have worsened her mental status per patient's family.  She has had some hallucinations.  She is often confused about where she is at.  Her husband states that she woke up one night recently around 2 in the morning and was ready to go out.  She has not had a fever, chills, but her appetite is decreased.  She has been sleeping more than usual.  She has had a sore throat due to oral thrush.  No reports of dyspnea, cough, chest pain, palpitations, dizziness, abdominal pain, nausea, emesis, diarrhea, constipation, melena or hematochezia.  She has being incontinent, but no complaints of dysuria.  There has not being hematuria to the best of the family's knowledge.  No polyuria, polydipsia, polyphagia or blurred vision.  ED Course: Initial vital signs temperature 98 F, pulse 86, respirations 17, blood pressure 100/73 mmHg and O2 sat 98% on room air.  The patient received 2 mg of Ativan IVP ordered 1745 and another milligram in order to 1845 while  trying to get the MRI images.  Her urinalysis shows trace leukocyte esterase.  CBC done at 1242 today by the cancer center showed a WBC of 4.4, hemoglobin 8.2 g/dL and platelets 152.  BMP shows a potassium of 3.3 mmol/L and a glucose of 135 mg/dL.  All other values are within normal limits.  Ammonia level was 68 mol/L.  Her LFTs show an increased AST of 63 and alkaline phosphatase of 141 mg/dL.  Albumin was 3.4 g/dL.  All other values are within normal limits.  Imaging: MRI of brain, thoracic/lumbar spine did not show any new lesions.  Hyperintense focus was unchanged and corresponded to a previously treated metastasis and seems to be unchanged.  However, these studies were motion degraded and she did not receive all the contrast.  Review of Systems: Unable to obtain.   Past Medical History:  Diagnosis Date  . Anticoagulated    xarelto  . Anxiety   . Breast cancer metastasized to multiple sites [facility])    liver, brain, and bone  . Breast cancer, stage 4 [facility]) oncologist-  dr Larene [place] ([facility])   dx 12/ 2015 -- breast cancer Stage 4,  ER/HER2 +,  w/  liver, brain and  bone mets/  chemotherapy and radiation therapy  . Chronic pain syndrome    secondary to cancer   . Depression   . Drug-induced cardiomyopathy ([place])    per last MUGA (08-08-2015), ef 56.5/ per last echo 05-18-2014 ef 60-65%  . Family history of prostate cancer   . GERD (gastroesophageal  reflux disease)   . History of colon polyps    07-13-2013  benign  . History of DVT (deep vein thrombosis)    07-09-2014  upper right extremity-  RIJ and right subclavian--  resolved  . History of gastritis    erosive  . History of pneumonia    HCAP 06-07-2015--  resolved per cxr 07-04-2015  . History of radiation therapy    12 fractions to L3 - S3, 30Gy and left spacula 20Gy (06-20-2014 to 07-10-2014) //  whole brain rxt (10-09-2014 to 10-26-2014)  . History of small bowel obstruction    S/P RESECTION 2008  . Migraine     . PONV (postoperative nausea and vomiting)    pt states scope patch does well    Past Surgical History:  Procedure Laterality Date  . BREAST REDUCTION SURGERY  03/17/2011   Procedure: MAMMARY REDUCTION BILATERAL (BREAST);  Surgeon: Mary A Contogiannis;  Location: [place];  Service: Plastics;  Laterality: Bilateral;  . CATARACT EXTRACTION W/ INTRAOCULAR LENS  IMPLANT, BILATERAL  2008  . CERVICAL FUSION  2003   C5 -- C6  . COLONOSCOPY N/A 07/13/2013   Procedure: COLONOSCOPY;  Surgeon: Rogene Houston, MD;  [facility];  Service: Endoscopy;  Laterality: N/A;  930  . COLONOSCOPY N/A 11/26/2014   Procedure: COLONOSCOPY;  Surgeon: Rogene Houston, MD;  [facility];  Service: Endoscopy;  Laterality: N/A;  730  . D & C HYSTEROSCOPY/  RESECTION ENDOMETRIAL MASS/  [place] ENDOMETRIAL ABLATION  04-11-2010  . DX LAPAROSCOPY W/ PARTIAL SMALL BOWEL RESECTION AND APPENDECTOMY  04-13-2007  . ESOPHAGOGASTRODUODENOSCOPY N/A 05/25/2014   Procedure: ESOPHAGOGASTRODUODENOSCOPY (EGD);  Surgeon: Rogene Houston, MD;  [facility];  Service: Endoscopy;  Laterality: N/A;  155  . KNEE ARTHROSCOPY Right 2005  . LAPAROSCOPIC ASSISTED VAGINAL HYSTERECTOMY  10-13-2010   w/ Bx Left Fallopian tube and Aspiration Right Ovarian Cyst  . LAPAROSCOPIC CHOLECYSTECTOMY  11-17-2002  . LAPAROSCOPIC SALPINGO OOPHERECTOMY Bilateral 08/26/2015   Procedure: LAPAROSCOPIC SALPINGO OOPHORECTOMY, bilateral;  Surgeon: Everlene Farrier, MD;  Location: [place];  Service: Gynecology;  Laterality: Bilateral;  . PORTACATH PLACEMENT  05-17-2014  . REDUCTION MAMMAPLASTY    . SHOULDER ARTHROSCOPY WITH ROTATOR CUFF REPAIR Right 2002  . TRANSTHORACIC ECHOCARDIOGRAM  05-18-2014   ef 60-65%//   last MUGA  (08-08-2015)  ef 56.6%       reports that she quit smoking about 23 years ago. Her smoking use included cigarettes. She has never used smokeless tobacco. She reports current  alcohol use. She reports that she does not use drugs.  No Known Allergies  Family History  Problem Relation Age of Onset  . Diabetes Father   . Heart attack Maternal Grandmother 30       multiple over lifetime.  . Cancer Maternal Grandmother 39       NOS  . Prostate cancer Maternal Grandfather        dx in his 39s  . Lung cancer Paternal Grandfather        dx <50  . Lymphoma Maternal Aunt        dx in her 77s  . Melanoma Cousin 33       maternal first cousin  . Brain cancer Cousin        paternal first cousin dx under 90  . Prostate cancer Other        MGF's father  . Colon cancer Other  MGM's mother   Prior to Admission medications   Medication Sig Start Date End Date Taking? Authorizing Provider  acetaminophen (TYLENOL) 325 MG tablet Take 650 mg by mouth every 6 (six) hours as needed for moderate pain or headache.    [provider]  calcium carbonate (TUMS - DOSED IN MG ELEMENTAL CALCIUM) 500 MG chewable tablet Chew 2 tablets by mouth daily.     [provider]  Calcium-Phosphorus-Vitamin D (CALCIUM/D3 ADULT GUMMIES PO) Take 2 tablets by mouth 2 (two) times daily. dosage of calcium 129m and vitamin d 10065m   [provider]  ciprofloxacin (CIPRO) 500 MG tablet Take 1 tablet (500 mg total) by mouth 2 (two) times daily. 04/22/18   KaDerek JackMD  cyclobenzaprine (FLEXERIL) 10 MG tablet cyclobenzaprine 10 mg tablet  Take 1 tablet(s) q 6-8 hrs as needed for spasm    [provider]  desvenlafaxine (PRISTIQ) 100 MG 24 hr tablet Take 1 tablet (100 mg total) by mouth daily. 05/09/18   Lockamy, Randi L, NP-C  dicyclomine (BENTYL) 20 MG tablet Take 1 tablet (20 mg total) by mouth 4 (four) times daily -  before meals and at bedtime. 11/17/17   Lockamy, Randi L, NP-C  diphenhydrAMINE (BENADRYL) 25 mg capsule Take 25 mg by mouth at bedtime as needed for itching.     [provider]  diphenoxylate-atropine (LOMOTIL) 2.5-0.025  MG tablet Take 2 tablets by mouth 4 (four) times daily as needed for diarrhea or loose stools. 11/17/17   Lockamy, Randi L, NP-C  DUREZOL 0.05 % EMUL INSTILL ONE DROP IN THE RIGHT EYE TWICE DAILY TIME ONE WEEK THEN ONCE DAILY 10/07/17   [provider]  furosemide (LASIX) 20 MG tablet TAKE 1 TABLET BY MOUTH DAILY 04/22/18   Lockamy, Randi L, NP-C  gabapentin (NEURONTIN) 300 MG capsule Take 2 capsules (600 mg total) by mouth at bedtime. 05/03/18   Higgs, VeMathis DadMD  lidocaine-prilocaine (EMLA) cream Apply 1 application topically daily as needed (apply to port before chemo). 04/28/17   DaHolley BoucheNP  lidocaine-prilocaine (EMLA) cream Apply to affected area once 03/03/18   Higgs, VeMathis DadMD  LORazepam (ATIVAN) 0.5 MG tablet TAKE 1 TABLET BY MOUTH EVERY 6 HOURS AS NEEDED FOR ANXIETY 05/03/18   Higgs, VeMathis DadMD  Melatonin 10 MG CAPS Take 1 tablet by mouth at bedtime. Pt states she takes one tablet at night for sleep     [provider]  morphine (MS CONTIN) 30 MG 12 hr tablet TAKE 1 TABLET BY MOUTH EVERY TWELVE HOURS 04/25/18   Higgs, VeMathis DadMD  NARCAN 4 MG/0.1ML LIQD nasal spray kit CALL 911. ADMINISTER A SINGLE SPRAY OF NARCAN IN ONE NOSTRIL, REPEAT EVERY 3 MINUTES AS NEEDED IF NO OR MINIMAL RESPONSE 04/06/17   [provider]  ondansetron (ZOFRAN) 4 MG tablet Zofran 4 mg tablet  1 q 6-8 hrs as needed for post op nausea    [provider]  ondansetron (ZOFRAN) 8 MG tablet TAKE ONE TABLET BY MOUTH EVERY 8 HOURS AS NEEDED FOR NAUSEA AND VOMITING 11/15/17   [provider]  oxyCODONE (OXY IR/ROXICODONE) 5 MG immediate release tablet Take 1-2 tablets (5-10 mg total) by mouth every 4 (four) hours as needed for moderate pain. 08/10/16   KeBaird CancerPA-C  pantoprazole (PROTONIX) 40 MG tablet Take 1 tablet (40 mg total) by mouth every 12 (twelve) hours. 03/08/18   Higgs, VeMathis DadMD  potassium chloride SA (K-DUR,KLOR-CON) 20 MEQ tablet  Take 2 tablets (40 mEq  total) by mouth daily. 04/22/18   Higgs, Mathis Dad, MD  promethazine (PHENERGAN) 25 MG tablet TAKE 1 TABLET BY MOUTH EVERY SIX HOURS AS NEEDED FOR NAUSEA OR VOMITING 12/23/16   Holley Bouche, NP  rivaroxaban (XARELTO) 20 MG TABS tablet TAKE 1 TABLET BY MOUTH EVERY DAY WITH SUPPER 02/11/18   Lockamy, Randi L, NP-C  spironolactone (ALDACTONE) 25 MG tablet Take 1 tablet (25 mg total) by mouth daily. 10/08/17   Derek Jack, MD  Zoledronic Acid (ZOMETA IV) Inject into the vein every 3 (three) months.    [provider]  zolpidem (AMBIEN) 10 MG tablet TAKE 1 TABLET BY MOUTH AT BEDTIME AS NEEDED FOR SLEEP 04/22/18   Higgs, Mathis Dad, MD    Physical Exam: Vitals:   05/13/18 1519 05/13/18 1520 05/13/18 1745 05/13/18 2044  BP:  100/73 (!) 117/50 (!) 105/44  Pulse:  86 81 82  Resp:  17 18   Temp:  98 F (36.7 C)  98.3 F (36.8 C)  TempSrc:  Oral  Oral  SpO2:  98% 98% 95%  Weight: 121 kg     Height: _0  (1.753 m)       Constitutional: Somnolent.  NAD, calm, comfortable Eyes: PERRL, lids and conjunctivae normal ENMT: Excoriated lip commissures.  Mucous membranes are dry. Posterior pharynx shows thrush.. Neck: normal, supple, no masses, no thyromegaly Respiratory: Decreased breath sounds on bases, otherwise clear to auscultation bilaterally, no wheezing, no crackles. Normal respiratory effort. No accessory muscle use.  Cardiovascular: Regular rate and rhythm, no murmurs / rubs / gallops.  Trace lower extremity edema.  Edema. 2+ pedal pulses. No carotid bruits.  Abdomen: Obese, soft, mild RUQ and suprapubic tenderness, no guarding or rebound, no masses palpated. No hepatosplenomegaly. Bowel sounds positive.  Musculoskeletal: no clubbing / cyanosis.  Good ROM, no contractures. Normal muscle tone.  Skin: no rashes, lesions, ulcers. No induration on limited dermatological examination. Neurologic: CN 2-12 grossly intact.  Moves all extremities.  Unable to fully evaluate due to  sedation. Psychiatric: Somnolent, there and oriented x2, partially oriented to time and to situation  Labs on Admission: I have personally reviewed following labs and imaging studies  CBC: Recent Labs  Lab 05/10/18 1240 05/13/18 1242  WBC 5.3 4.4  NEUTROABS 3.9 3.2  HGB 8.4* 8.2*  HCT 29.3* 28.4*  MCV 82.3 83.3  PLT 138* 683   Basic Metabolic Panel: Recent Labs  Lab 05/10/18 1240 05/13/18 1242 05/13/18 1556  NA 139 138  --   K 3.4* 3.3*  --   CL 102 100  --   CO2 27 29  --   GLUCOSE 122* 135*  --   BUN 6 6  --   CREATININE 0.74 0.85  --   CALCIUM 8.7* 8.9  --   MG  --   --  2.1  PHOS  --   --  3.4   GFR: Estimated Creatinine Clearance: 105.2 mL/min (by C-G formula based on SCr of 0.85 mg/dL). Liver Function Tests: Recent Labs  Lab 05/10/18 1240 05/13/18 1242  AST 57* 63*  ALT 25 25  ALKPHOS 133* 141*  BILITOT 0.7 0.8  PROT 7.0 7.1  ALBUMIN 3.4* 3.4*   No results for input(s): LIPASE, AMYLASE in the last 168 hours. Recent Labs  Lab 05/13/18 1241  AMMONIA 68*   Coagulation Profile: No results for input(s): INR, PROTIME in the last 168 hours. Cardiac Enzymes: Recent Labs  Lab 05/13/18 1556  TROPONINI <  0.03   BNP (last 3 results) No results for input(s): PROBNP in the last 8760 hours. HbA1C: No results for input(s): HGBA1C in the last 72 hours. CBG: No results for input(s): GLUCAP in the last 168 hours. Lipid Profile: No results for input(s): CHOL, HDL, LDLCALC, TRIG, CHOLHDL, LDLDIRECT in the last 72 hours. Thyroid Function Tests: No results for input(s): TSH, T4TOTAL, FREET4, T3FREE, THYROIDAB in the last 72 hours. Anemia Panel: No results for input(s): VITAMINB12, FOLATE, FERRITIN, TIBC, IRON, RETICCTPCT in the last 72 hours. Urine analysis:    Component Value Date/Time   COLORURINE AMBER (A) 05/13/2018 1242   APPEARANCEUR HAZY (A) 05/13/2018 1242   LABSPEC 1.024 05/13/2018 1242   PHURINE 5.0 05/13/2018 1242   GLUCOSEU NEGATIVE  05/13/2018 1242   HGBUR NEGATIVE 05/13/2018 1242   BILIRUBINUR NEGATIVE 05/13/2018 1242   KETONESUR NEGATIVE 05/13/2018 1242   PROTEINUR NEGATIVE 05/13/2018 1242   UROBILINOGEN 0.2 01/23/2015 1050   NITRITE NEGATIVE 05/13/2018 1242   LEUKOCYTESUR TRACE (A) 05/13/2018 1242    Radiological Exams on Admission: Mr Brain Wo Contrast  Result Date: 05/13/2018 CLINICAL DATA:  Metastatic breast cancer with worsening confusion. EXAM: MRI HEAD WITHOUT CONTRAST TECHNIQUE: Multiplanar, multiecho pulse sequences of the brain and surrounding structures were obtained without intravenous contrast. COMPARISON:  Head CT 05/10/2018 and MRI 02/23/2018 FINDINGS: The patient terminated the examination before IV contrast was administered. A complete noncontrast brain MRI was obtained. Brain: There is no evidence of acute infarct, intracranial mass effect, or extra-axial fluid collection. There is mild cerebral atrophy. 3 mm T2 hyperintense focus in the right caudate head is unchanged and corresponds to a treated metastasis. T2 hyperintensities in the cerebral white matter bilaterally are unchanged and nonspecific but may reflect mild chronic small vessel ischemic disease or post treatment changes. Evaluation for brain metastases is limited by lack of IV contrast, however no new area vasogenic edema is present. A partially empty sella and a chronic microhemorrhage in the medial left parietal lobe are unchanged. Vascular: Major intracranial vascular flow voids are preserved. Skull and upper cervical spine: Heterogeneous bone marrow signal in the clivus with a sclerotic metastasis shown on CT. Sinuses/Orbits: Bilateral cataract extraction. Paranasal sinuses and mastoid air cells are clear. Other: None. IMPRESSION: 1. No acute intracranial abnormality. 2. Limited assessment for intracranial metastatic disease as the patient terminated the examination before IV contrast was administered. No mass lesion or vasogenic edema on this  unenhanced study. Electronically Signed   By: Logan Bores M.D.   On: 05/13/2018 17:34   Mr Thoracic Spine Wo Contrast  Result Date: 05/13/2018 CLINICAL DATA:  Severe back pain. EXAM: MRI THORACIC SPINE WITHOUT CONTRAST TECHNIQUE: Multiplanar, multisequence MR imaging of the thoracic spine was performed. No intravenous contrast was administered. COMPARISON:  Earlier same day.  03/11/2018. FINDINGS: The patient could not do any better with imaging on this attempt. Again, we obtained only motion degraded noncontrast imaging. We did achieve some motion degraded axial imaging. Alignment: Normal Vertebrae: Treated metastatic disease as seen previously. Compared to the study of last November, no new lesions are evident. There is no evidence of pathologic fracture. Disease is most extensive within the T7 vertebral body, the T10 vertebral body and the T11 vertebral body. Small lesion in the posterior left corner of T9 as seen previously. Other small scattered lesions as seen previously. There is no evidence of extraosseous disease on this exam. The canal and foramina appear sufficiently patent. No specific cause of acutely worsened pain is identified. Cord:  No cord compression or cord lesion identified. Paraspinal and other soft tissues: Otherwise negative Disc levels: No evidence of disc herniation in the thoracic region. IMPRESSION: This is again in abbreviated motion degraded exam. No contrast was achieved. Axial images are motion degraded. No new lesions are seen when compared to the study of last November. Treated metastatic lesions as outlined above. No evidence of extraosseous tumor. No evidence of pathologic fracture. No evidence of spinal cord or nerve root compression. Electronically Signed   By: Nelson Chimes M.D.   On: 05/13/2018 19:29   Mr Thoracic Spine Wo Contrast  Result Date: 05/13/2018 CLINICAL DATA:  Back pain.  Metastatic breast cancer. EXAM: MRI THORACIC SPINE WITHOUT CONTRAST TECHNIQUE:  Multiplanar, multisequence MR imaging of the thoracic spine was performed. No intravenous contrast was administered. COMPARISON:  03/11/2018 FINDINGS: The patient terminated the examination prematurely. Only sagittal T1 and sagittal T2 sequences were obtained and these are motion degraded (severely so on the T1 sequence. IV contrast was not administered. Alignment: No significant listhesis. Vertebrae: Grossly similar size of multiple bone metastases including a large sclerotic lesion in the T7 vertebral body and right pedicle and extensive lesion involving the T10 vertebral body and posterior elements. Small volume epidural tumor at T10 on the prior study is inadequately evaluated on this study though no gross epidural tumor is evident. Preserved vertebral body heights without evidence of fracture on this limited examination. Partially visualized small metastases in the upper lumbar spine, cervical spine, and ribs. Cord: Grossly normal cord signal and morphology on the motion degraded sagittal T2 sequence. Paraspinal and other soft tissues: Grossly unremarkable. Disc levels: Similar appearance of very mild thoracic spondylosis. More prominent disc bulging/broad posterior disc protrusion at L1-2 without evidence of compressive spinal stenosis. IMPRESSION: 1. Incomplete, motion degraded noncontrast examination. 2. Similar appearance of multiple bone metastases. No gross epidural tumor. Previously demonstrated small volume epidural tumor at T10 inadequately reassessed on this study. Electronically Signed   By: Logan Bores M.D.   On: 05/13/2018 18:13   Mr Lumbar Spine Wo Contrast  Result Date: 05/13/2018 CLINICAL DATA:  Followup metastatic breast cancer. EXAM: MRI LUMBAR SPINE WITHOUT CONTRAST TECHNIQUE: Multiplanar, multisequence MR imaging of the lumbar spine was performed. No intravenous contrast was administered. COMPARISON:  CT 09/21/2017. FINDINGS: The patient could not tolerate complete imaging. Sagittal T1  and T2 imaging was performed. Very motion degraded axial imaging is performed. lSegmentation: 5 lumbar type vertebral bodies presumed. Alignment:  Normal Vertebrae: Metastatic lesions scattered throughout the lumbar spine and sacrum. Most of these are small in the lumbar region, less than a cm. Exceptions to this include AA 1.5 cm metastasis within the anterior L4 vertebral body and a metastasis within the sacrum in the S2-S3 region measuring up to 3.4 cm in size. Conus medullaris and cauda equina: Conus extends to the L1 level. Conus and cauda equina appear normal. No evidence of neural compression by tumor. Paraspinal and other soft tissues: Negative as seen. Disc levels: Central disc herniation at L1-2 that indents the thecal sac but does not appear to compress the neural structures. Shallow disc protrusion at L4-5. Facet osteoarthritis at L3-4, L4-5 and L5-S1. No apparent neural compressive stenosis. IMPRESSION: Scattered metastatic disease throughout the lumbar spine and the sacrum. No evidence of pathologic fracture or extraosseous tumor causing neural compression. Largest lesion in the lumbar region is 1.5 cm metastasis within the anterior L4 vertebral body. Sacral disease at S2 and S3 measures up to 3.4 cm in size. Note  that this examination is limited by patient discomfort and only sagittal T1 and T2 imaging was achieved. Axial imaging is severely motion degraded. Electronically Signed   By: Nelson Chimes M.D.   On: 05/13/2018 19:15   Dg Chest Port 1 View  Result Date: 05/13/2018 CLINICAL DATA:  Altered mental status.  Metastatic breast carcinoma EXAM: PORTABLE CHEST 1 VIEW COMPARISON:  Chest radiograph Sep 28, 2016; chest CT March 28, 2018 FINDINGS: Port-A-Cath tip is in the superior vena cava. No pneumothorax. No evident edema or consolidation. Heart size and pulmonary vascular normal. No adenopathy. There is postoperative change in the lower cervical region. There is an expansile lesion in the  anterior right sixth rib, suspicious for metastatic focus. Thoracic spine sclerotic lesions seen on prior CT are not well seen on this portable chest radiographic examination. IMPRESSION: Apparent metastatic focus in the anterior right sixth rib. No edema or consolidation. No adenopathy demonstrable by radiography. Electronically Signed   By: Lowella Grip III M.D.   On: 05/13/2018 16:18    EKG: Independently reviewed.   Assessment/Plan Principal Problem:   Hepatic encephalopathy (HCC) Observation/telemetry. Continue IV fluids. Continue oral lactulose. Follow-up ammonia level in a.m. Follow-up LFTs in a.m. Check RUQ ultrasound to further characterize liver.  Active Problems:   Breast cancer, stage 4 ([place]) Continue treatment and follow-ups per oncology.    Hypokalemia Replacing. Follow-up potassium level.    Depression Resume Pristiq once more alert and those can be confirmed.    DVT (deep venous thrombosis) (HCC) Continue Xarelto tomorrow evening.    GERD (gastroesophageal reflux disease) Protonix 40 mg p.o. daily.    DVT prophylaxis: On Xarelto. Code Status: Full code. Family Communication: Her husband and other family members were with her. Disposition Plan: Observation for IV hydration, K replacement and treatment with lactulose. Consults called: Admission status: Observation/telemetry.   Reubin Milan MD [facility]  If 7PM-7AM, please contact night-coverage [email or web address] Password [email or web address]  05/13/2018, 10:00 PM

## 2018-05-13 NOTE — Patient Instructions (Signed)
[facility] at [facility] Discharge Instructions     Thank you for choosing [facility] at [facility] to provide your oncology and hematology care.  To afford each patient quality time with our provider, please arrive at least 15 minutes before your scheduled appointment time.   If you have a lab appointment with the Cancer Center please come in thru the  Main Entrance and check in at the main information desk  You need to re-schedule your appointment should you arrive 10 or more minutes late.  We strive to give you quality time with our providers, and arriving late affects you and other patients whose appointments are after yours.  Also, if you no show three or more times for appointments you may be dismissed from the clinic at the providers discretion.     Again, thank you for choosing [facility].  Our hope is that these requests will decrease the amount of time that you wait before being seen by our physicians.       _____________________________________________________________  Should you have questions after your visit to  Cancer Center, please contact our office at [phone] between the hours of 8:00 a.m. and 4:30 p.m.  Voicemails left after 4:00 p.m. will not be returned until the following business day.  For prescription refill requests, have your pharmacy contact our office and allow 72 hours.    Cancer Center Support Programs:   > Cancer Support Group  2nd Tuesday of the month 1pm-2pm, Journey Room    

## 2018-05-13 NOTE — Progress Notes (Signed)
[name], [place] [address]   CLINIC:  Medical Oncology/Hematology  PCP:  [name], MD [place] [address] [phone]   REASON FOR VISIT: Follow-up for Stage IV adenocarcinoma of breast, ER+, HER2 + with metastatic disease to brain and bone  CURRENT THERAPY: Herceptin + Tykerb beginning on 07/23/2017  BRIEF ONCOLOGIC HISTORY:    Breast cancer, stage 4 ([place])   04/25/2014 Initial Diagnosis    Breast cancer, stage 4    04/25/2014 Imaging    CT abdomen/pelvis with widespread metastatic disease to the liver, multiple lytic lesions throughout spine and pelvis. No FX or epidural tumor identified    04/26/2014 Imaging    CT head unremarkable    04/26/2014 Imaging    CT chest with no lung mass or pulmonary nodules, no adenopathy. Lytic bone lesions, right 2nd rib    04/27/2014 Initial Biopsy    U/S guided liver biopsy, lesion in anterior and inferior left hepatic lobe biopsied    04/27/2014 Pathology Results    Metastatic adenocarcinoma, CK7, ER+, patchy positivity with PR. Possible primary includes breast, less likely gynecologic    05/15/2014 Mammogram    BI-RADS CATEGORY  2: Benign Finding(s)    05/16/2014 PET scan    1. Intensely hypermetabolic hepatic metastasis. 2. Widespread hypermetabolic skeletal lesions. 3. No primary adenocarcinoma identified by FDG PET imaging.    05/19/2014 Imaging    MUGA- Left ventricular ejection fracture greater than 70%.    05/21/2014 Breast MRI    No suspicious masses or enhancement within the breasts. No axillary adenopathy.    05/22/2014 - 07/03/2014 Antibody Plan    Herceptin/Perjeta/Tamoxifen    06/12/2014 - 07/03/2014 Chemotherapy    Taxotere added secondary to persistent abdominal and back pain    06/17/2014 - 06/19/2014 Hospital Admission    Neutropenia, fever, diarrhea, nausea, vomiting    06/20/2014 - 07/10/2014 Radiation Therapy    Dr. Thea Silversmith 12 fractions to L3-S3 (30 Gy)  and left scapula (20 Gy).     07/03/2014 Adverse Reaction    Perjeta- induced diarrhea.  Perjeta discontinued    07/16/2014 - 07/20/2014 Hospital Admission    Electrolyte abnormalities, and diarrhea.  Suspect Perjeta-induced diarrhea.  Negative GI work-up.    07/24/2014 - 08/19/2015 Chemotherapy    Herceptin/Tamoxifen/Xgeva    08/21/2014 Imaging    MUGA- Left ventricular ejection fraction equals 71%.    08/24/2014 PET scan    Dramatic reduction in metabolic activity of the widespread liver metastasis. Liver metastasis now have metabolic activity equal to background normal liver activity. Liver has a nodular contour. Marked reduction in metabolic activity of skeletal lesions..    10/05/2014 Progression    Widespread metastatic disease to the brain as described. Between 20 and 30 intracranial metastatic deposits are now seen. No midline shift or incipient herniation    10/09/2014 - 10/26/2014 Radiation Therapy    Whole Brain XRT    11/14/2014 Imaging    MUGA- LVEF 67%    02/13/2015 Imaging    MUGA- LVEF 59%    02/15/2015 Treatment Plan Change    Due to declining LVEF, will hold Herceptin per PI guidelines.    04/12/2015 -  Chemotherapy    Herceptin restarted    06/02/2015 - 06/08/2015 Hospital Admission    Pneumonia    07/05/2015 Progression     PET/CT concern for mild progression of skeletal metastasis with several lesions within the spine and 1 lesion in the  Left iliac wing with mild to moderate metabolic activity new from prior. Rising CA 27-29    07/16/2015 - 10/23/2015 Anti-estrogen oral therapy    Arimidex    07/16/2015 Imaging    MRI brain with satisfactory post treatment apperance of brain. interval resolved enhancing R caudate metastasis, minimal punctate residual enhancing metastatic disease at the inferior L cerebellum. No new metastatic disease or new intracranial abnormality    07/19/2015 Treatment Plan Change    Discontinue Tamoxifen, Zoladex plus Arimidex.     08/27/2015  Procedure    Laparoscopic bilateral salpingo-oophorectomy by Dr. Gaetano Net    10/17/2015 PET scan    Osseous metastatic disease appears slightly progressive based on a new right scapular lesion and increased uptake within lesions in the thoracic spine, left iliac wing and  proximal right femur.    10/17/2015 Progression    Slight progression on PET scan imaging.    10/18/2015 Imaging    REsolved enhancing metastatic disease to the brain status post WBXRT    10/23/2015 - 02/12/2016 Adjuvant Chemotherapy    Faslodex loading followed by maintenance dose.  (Herceptin continued)    11/04/2015 Imaging    MUGA- LEFT ventricular ejection fraction 51% slightly decreased in a 57% on the previous exam.    12/24/2015 Treatment Plan Change    Zometa every 28 days.  Xgeva discontinued.      01/01/2016 Imaging    MUGA- Left ventricular ejection fraction equals 57.9%. This is increased from 51.1% previously.    02/03/2016 PET scan    1. Mixed metabolic changes in the scattered hypermetabolic sclerotic osseous metastases throughout the axial and proximal appendicular skeleton as detailed above. 2. No new sites of hypermetabolic metastatic disease. Stable pseudo-cirrhotic appearance of the liver due to treated liver metastases with no hypermetabolic liver metastases.     02/03/2016 Progression    PET shows mixed osseous response.  Some lesions more hypermetabolic, others improved.    02/05/2016 Treatment Plan Change    D/C Herceptin.  Faslodex as scheduled on 02/12/2016, then discontinued.  Continue Zometa.    02/05/2016 Treatment Plan Change    Prescriptions for Xeloda 7 days on and 7 days off and Tykerb printed and provided for authorization.    02/19/2016 -  Chemotherapy    Xeloda 2300 mg BID 7 days on and 7 days off and Tykerb     02/24/2016 Treatment Plan Change    Xeloda dose reduced by 10% to 2000 mg BID week on and week off.    03/18/2016 Treatment Plan Change    Xeloda dose reduced to 2000 mg  in AM and 1500 mg in PM 7 days on and 7 days off.    03/23/2016 Imaging    MUGA- Normal LEFT ventricular ejection fraction of 56% not significantly changed from 58% on previous exam.    05/19/2016 Imaging    MRI brain- . No new focus of abnormal enhancement to suggest interval metastatic disease. 2. No acute intracranial abnormality. 3. Stable foci of T2 FLAIR hyperintensity in white matter and in the right caudate head compatible with posttreatment changes and treated metastasis.    05/20/2016 PET scan    1. The multiple osseous metastatic lesions shown to be hypermetabolic on the prior exam are reduced in activity or even resolved in hypermetabolic activity compared to prior, as detailed above. There are also numerous sclerotic bony lesions wedge are not currently and were not previously hypermetabolic, compatible with old non active lesions. 2. No findings of extra osseous  metastatic disease currently. 3. Hepatic pseudocirrhosis. 4. Healing rib fractures. Chronic pathologic fracture the left acromion.    07/08/2016 Imaging    MUGA- Low normal LEFT ventricular ejection fraction of 53%, little changed since the 56% on 03/23/2016 but slightly decreased from the 58% on 01/01/2016.    09/07/2016 Echocardiogram    MUGA- Normal LEFT ventricular ejection fraction of 59% with normal LV wall motion.    09/07/2016 Treatment Plan Change    Xeloda dose reduced to 1500 mg BID 7 days on and 7 days off.    09/15/2016 PET scan    No significant change in diffuse sclerotic bone metastases on CT images, although several show mildly increased FDG uptake since prior study.  No evidence of soft tissue metastatic disease.    11/02/2016 Imaging    MRI brain- No change from the prior study. Previously noted metastatic deposits have been treated. No new lesions identified.    07/23/2017 -  Chemotherapy    Herceptin + Tykerb     Metastatic breast cancer ([place])   08/26/2015 Initial Diagnosis     Metastatic breast cancer ([place])    03/04/2018 -  Chemotherapy    The patient had ado-trastuzumab emtansine (KADCYLA) 420 mg in sodium chloride 0.9 % 250 mL chemo infusion, 3.4 mg/kg = 440 mg, Intravenous, Once, 4 of 5 cycles Administration: 420 mg (03/04/2018), 420 mg (03/25/2018), 420 mg (04/15/2018), 420 mg (05/06/2018)  for chemotherapy treatment.       CANCER STAGING: Cancer Staging Breast cancer, stage 4 ([place]) Staging form: Breast, AJCC 7th Edition - Clinical stage from 05/27/2014: Stage IV (T0, N0, M1) - Signed by Baird Cancer, PA-C on 05/27/2014    INTERVAL HISTORY:  Ms. Najera 53 y.o. female returns for routine follow-up for adenocarcinoma of breast with metastatic disease to brain and bone. She is having new onset confusion, hallucinations, dizziness, and urinary incontinence. She has became weaker and sleeps all day and night. Her legs are weak she is unable to walk more than a couple steps which is new. She walked into the clinic last Friday for treatment. She has pain in her low back from the metastatic disease. Denies any nausea, vomiting, or diarrhea. Denies any new pains. Had not noticed any recent bleeding such as epistaxis, hematuria or hematochezia. Denies recent chest pain on exertion, shortness of breath on minimal exertion, pre-syncopal episodes, or palpitations. Denies any numbness or tingling in hands or feet. Denies any recent fevers, infections, or recent hospitalizations. Her appetite and energy level is at 25%.    REVIEW OF SYSTEMS:  Review of Systems  Constitutional: Positive for fatigue.  Genitourinary: Positive for bladder incontinence.   Musculoskeletal: Positive for back pain.  Neurological: Positive for dizziness and extremity weakness.  Psychiatric/Behavioral: Positive for confusion.  All other systems reviewed and are negative.    PAST MEDICAL/SURGICAL HISTORY:  Past Medical History:  Diagnosis Date  . Anticoagulated    xarelto  . Anxiety   .  Breast cancer metastasized to multiple sites [facility])    liver, brain, and bone  . Breast cancer, stage 4 [facility]) oncologist-  dr Larene [place] ([facility])   dx 12/ 2015 -- breast cancer Stage 4,  ER/HER2 +,  w/  liver, brain and  bone mets/  chemotherapy and radiation therapy  . Chronic pain syndrome    secondary to cancer   . Depression   . Drug-induced cardiomyopathy ([place])    per last MUGA (08-08-2015), ef 56.5/ per last echo 05-18-2014  ef 60-65%  . Family history of prostate cancer   . GERD (gastroesophageal reflux disease)   . History of colon polyps    07-13-2013  benign  . History of DVT (deep vein thrombosis)    07-09-2014  upper right extremity-  RIJ and right subclavian--  resolved  . History of gastritis    erosive  . History of pneumonia    HCAP 06-07-2015--  resolved per cxr 07-04-2015  . History of radiation therapy    12 fractions to L3 - S3, 30Gy and left spacula 20Gy (06-20-2014 to 07-10-2014) //  whole brain rxt (10-09-2014 to 10-26-2014)  . History of small bowel obstruction    S/P RESECTION 2008  . Migraine   . PONV (postoperative nausea and vomiting)    pt states scope patch does well   Past Surgical History:  Procedure Laterality Date  . BREAST REDUCTION SURGERY  03/17/2011   Procedure: MAMMARY REDUCTION BILATERAL (BREAST);  Surgeon: Mary A Contogiannis;  Location: [facility];  Service: Plastics;  Laterality: Bilateral;  . CATARACT EXTRACTION W/ INTRAOCULAR LENS  IMPLANT, BILATERAL  2008  . CERVICAL FUSION  2003   C5 -- C6  . COLONOSCOPY N/A 07/13/2013   Procedure: COLONOSCOPY;  Surgeon: Rogene Houston, MD;  [facility];  Service: Endoscopy;  Laterality: N/A;  930  . COLONOSCOPY N/A 11/26/2014   Procedure: COLONOSCOPY;  Surgeon: Rogene Houston, MD;  [facility];  Service: Endoscopy;  Laterality: N/A;  730  . D & C HYSTEROSCOPY/  RESECTION ENDOMETRIAL MASS/  [place] ENDOMETRIAL ABLATION  04-11-2010  . DX LAPAROSCOPY  W/ PARTIAL SMALL BOWEL RESECTION AND APPENDECTOMY  04-13-2007  . ESOPHAGOGASTRODUODENOSCOPY N/A 05/25/2014   Procedure: ESOPHAGOGASTRODUODENOSCOPY (EGD);  Surgeon: Rogene Houston, MD;  [facility];  Service: Endoscopy;  Laterality: N/A;  155  . KNEE ARTHROSCOPY Right 2005  . LAPAROSCOPIC ASSISTED VAGINAL HYSTERECTOMY  10-13-2010   w/ Bx Left Fallopian tube and Aspiration Right Ovarian Cyst  . LAPAROSCOPIC CHOLECYSTECTOMY  11-17-2002  . LAPAROSCOPIC SALPINGO OOPHERECTOMY Bilateral 08/26/2015   Procedure: LAPAROSCOPIC SALPINGO OOPHORECTOMY, bilateral;  Surgeon: Everlene Farrier, MD;  Location: [facility];  Service: Gynecology;  Laterality: Bilateral;  . PORTACATH PLACEMENT  05-17-2014  . REDUCTION MAMMAPLASTY    . SHOULDER ARTHROSCOPY WITH ROTATOR CUFF REPAIR Right 2002  . TRANSTHORACIC ECHOCARDIOGRAM  05-18-2014   ef 60-65%//   last MUGA  (08-08-2015)  ef 56.6%       SOCIAL HISTORY:  Social History   Socioeconomic History  . Marital status: Married    Spouse name: Not on file  . Number of children: Not on file  . Years of education: Not on file  . Highest education level: Not on file  Occupational History  . Not on file  Social Needs  . Financial resource strain: Not on file  . Food insecurity:    Worry: Not on file    Inability: Not on file  . Transportation needs:    Medical: Not on file    Non-medical: Not on file  Tobacco Use  . Smoking status: Former Smoker    Types: Cigarettes    Last attempt to quit: 08/20/1994    Years since quitting: 23.7  . Smokeless tobacco: Never Used  Substance and Sexual Activity  . Alcohol use: Yes    Comment: Occasionally  . Drug use: No  . Sexual activity: Yes    Partners: Male    Birth control/protection: Surgical,  Condom  Lifestyle  . Physical activity:    Days per week: Not on file    Minutes per session: Not on file  . Stress: Not on file  Relationships  . Social connections:    Talks on phone: Not on  file    Gets together: Not on file    Attends religious service: Not on file    Active member of club or organization: Not on file    Attends meetings of clubs or organizations: Not on file    Relationship status: Not on file  . Intimate partner violence:    Fear of current or ex partner: Not on file    Emotionally abused: Not on file    Physically abused: Not on file    Forced sexual activity: Not on file  Other Topics Concern  . Not on file  Social History Narrative  . Not on file    FAMILY HISTORY:  Family History  Problem Relation Age of Onset  . Diabetes Father   . Heart attack Maternal Grandmother 30       multiple over lifetime.  . Cancer Maternal Grandmother 70       NOS  . Prostate cancer Maternal Grandfather        dx in his 68s  . Lung cancer Paternal Grandfather        dx <50  . Lymphoma Maternal Aunt        dx in her 5s  . Melanoma Cousin 78       maternal first cousin  . Brain cancer Cousin        paternal first cousin dx under 30  . Prostate cancer Other        MGF's father  . Colon cancer Other        MGM's mother    CURRENT MEDICATIONS:  Outpatient Encounter Medications as of 05/13/2018  Medication Sig  . acetaminophen (TYLENOL) 325 MG tablet Take 650 mg by mouth every 6 (six) hours as needed for moderate pain or headache.  . calcium carbonate (TUMS - DOSED IN MG ELEMENTAL CALCIUM) 500 MG chewable tablet Chew 2 tablets by mouth daily.   . Calcium-Phosphorus-Vitamin D (CALCIUM/D3 ADULT GUMMIES PO) Take 2 tablets by mouth 2 (two) times daily. dosage of calcium 121m and vitamin d 1007m . ciprofloxacin (CIPRO) 500 MG tablet Take 1 tablet (500 mg total) by mouth 2 (two) times daily.  . cyclobenzaprine (FLEXERIL) 10 MG tablet cyclobenzaprine 10 mg tablet  Take 1 tablet(s) q 6-8 hrs as needed for spasm  . desvenlafaxine (PRISTIQ) 100 MG 24 hr tablet Take 1 tablet (100 mg total) by mouth daily.  . Marland Kitchenicyclomine (BENTYL) 20 MG tablet Take 1 tablet (20 mg  total) by mouth 4 (four) times daily -  before meals and at bedtime.  . diphenhydrAMINE (BENADRYL) 25 mg capsule Take 25 mg by mouth at bedtime as needed for itching.   . diphenoxylate-atropine (LOMOTIL) 2.5-0.025 MG tablet Take 2 tablets by mouth 4 (four) times daily as needed for diarrhea or loose stools.  . DUREZOL 0.05 % EMUL INSTILL ONE DROP IN THE RIGHT EYE TWICE DAILY TIME ONE WEEK THEN ONCE DAILY  . furosemide (LASIX) 20 MG tablet TAKE 1 TABLET BY MOUTH DAILY  . gabapentin (NEURONTIN) 300 MG capsule Take 2 capsules (600 mg total) by mouth at bedtime.  . lidocaine-prilocaine (EMLA) cream Apply 1 application topically daily as needed (apply to port before chemo).  . Marland Kitchenidocaine-prilocaine (EMLA) cream Apply to  affected area once  . LORazepam (ATIVAN) 0.5 MG tablet TAKE 1 TABLET BY MOUTH EVERY 6 HOURS AS NEEDED FOR ANXIETY  . Melatonin 10 MG CAPS Take 1 tablet by mouth at bedtime. Pt states she takes one tablet at night for sleep   . morphine (MS CONTIN) 30 MG 12 hr tablet TAKE 1 TABLET BY MOUTH EVERY TWELVE HOURS  . NARCAN 4 MG/0.1ML LIQD nasal spray kit CALL 911. ADMINISTER A SINGLE SPRAY OF NARCAN IN ONE NOSTRIL, REPEAT EVERY 3 MINUTES AS NEEDED IF NO OR MINIMAL RESPONSE  . ondansetron (ZOFRAN) 4 MG tablet Zofran 4 mg tablet  1 q 6-8 hrs as needed for post op nausea  . ondansetron (ZOFRAN) 8 MG tablet TAKE ONE TABLET BY MOUTH EVERY 8 HOURS AS NEEDED FOR NAUSEA AND VOMITING  . oxyCODONE (OXY IR/ROXICODONE) 5 MG immediate release tablet Take 1-2 tablets (5-10 mg total) by mouth every 4 (four) hours as needed for moderate pain.  . pantoprazole (PROTONIX) 40 MG tablet Take 1 tablet (40 mg total) by mouth every 12 (twelve) hours.  . potassium chloride SA (K-DUR,KLOR-CON) 20 MEQ tablet Take 2 tablets (40 mEq total) by mouth daily.  . promethazine (PHENERGAN) 25 MG tablet TAKE 1 TABLET BY MOUTH EVERY SIX HOURS AS NEEDED FOR NAUSEA OR VOMITING  . rivaroxaban (XARELTO) 20 MG TABS tablet TAKE 1 TABLET  BY MOUTH EVERY DAY WITH SUPPER  . spironolactone (ALDACTONE) 25 MG tablet Take 1 tablet (25 mg total) by mouth daily.  . Zoledronic Acid (ZOMETA IV) Inject into the vein every 3 (three) months.  . zolpidem (AMBIEN) 10 MG tablet TAKE 1 TABLET BY MOUTH AT BEDTIME AS NEEDED FOR SLEEP   No facility-administered encounter medications on file as of 05/13/2018.     ALLERGIES:  No Known Allergies   PHYSICAL EXAM:  ECOG Performance status: 1  Vitals:   05/13/18 1300  BP: 124/68  Pulse: 90  Resp: 18  Temp: 98 F (36.7 C)  SpO2: 98%   Filed Weights   05/13/18 1300  Weight: 268 lb 7 oz (121.8 kg)    Physical Exam Constitutional:      Appearance: Normal appearance. She is normal weight.  Musculoskeletal: Normal range of motion.  Skin:    General: Skin is warm and dry.  Neurological:     Mental Status: She is alert. She is disoriented.     Motor: Weakness present.     Gait: Gait abnormal.  Psychiatric:     Comments: Hallucination, confusion, slurred speech   Extremities 1+ edema. Muscle strength 3 out of 5 in the left leg and 4 out of 5 in the right leg. -She is confused to the place and time But oriented to the person.   LABORATORY DATA:  I have reviewed the labs as listed.  CBC    Component Value Date/Time   WBC 4.4 05/13/2018 1242   RBC 3.41 (L) 05/13/2018 1242   HGB 8.2 (L) 05/13/2018 1242   HCT 28.4 (L) 05/13/2018 1242   PLT 152 05/13/2018 1242   MCV 83.3 05/13/2018 1242   MCH 24.0 (L) 05/13/2018 1242   MCHC 28.9 (L) 05/13/2018 1242   RDW 20.4 (H) 05/13/2018 1242   LYMPHSABS 0.6 (L) 05/13/2018 1242   MONOABS 0.6 05/13/2018 1242   EOSABS 0.0 05/13/2018 1242   BASOSABS 0.0 05/13/2018 1242   CMP Latest Ref Rng & Units 05/13/2018 05/10/2018 05/06/2018  Glucose 70 - 99 mg/dL 135(H) 122(H) 197(H)  BUN 6 - 20 mg/dL  _0 Creatinine 0.44 - 1.00 mg/dL 0.85 0.74 0.85  Sodium 135 - 145 mmol/L 138 139 138  Potassium 3.5 - 5.1 mmol/L 3.3(L) 3.4(L) 3.8  Chloride 98 - 111  mmol/L 100 102 102  CO2 22 - 32 mmol/L _1 Calcium 8.9 - 10.3 mg/dL 8.9 8.7(L) 9.1  Total Protein 6.5 - 8.1 g/dL 7.1 7.0 6.9  Total Bilirubin 0.3 - 1.2 mg/dL 0.8 0.7 0.5  Alkaline Phos 38 - 126 U/L 141(H) 133(H) 121  AST 15 - 41 U/L 63(H) 57(H) 36  ALT 0 - 44 U/L _2 DIAGNOSTIC IMAGING:  I have independently reviewed the scans and discussed with the patient.   I have reviewed Francene Finders, NP's note and agree with the documentation.  I personally performed a face-to-face visit, made revisions and my assessment and plan is as follows.    ASSESSMENT & PLAN:   Breast cancer, stage 4 ([place]) 1.  Metastatic HER-2 positive breast cancer to the bones and brain: - Patient took Xeloda and Tykerb for over a year, Xeloda discontinued secondary to poor tolerance. - Herceptin and Tykerb from 07/23/2017 through October 2019 -PET/CT scan on 02/21/2018 showed worsening disease with increasing T10 and right scapular lesions, new abnormal uptake within T11 vertebra. -Brain MRI on 02/23/2018 did not show any progression. -MRI T-spine on 03/11/2018 shows osseous metastatic disease most notable at T10, there is a left preferential epidural tumor extension causing left foraminal impingement at T10-11.  She reportedly completed radiation to the spine during the week of Thanksgiving. - Kadcyla started on 03/04/2018, cycle 4 on 05/06/2018. -Patient was brought in by her husband today as she is feeling very weak particularly in the legs.  She reportedly lost control of her bladder for the last few days. -She sustained a fall when she was trying to get up from the commode on Tuesday.  She went to the ER and a CT of the head and C-spine were done which did not show significant findings. -She is also progressively getting confused.  She is not eating much.  As per the husband, she is seeing people. - Physical examination today showed left leg weakness more than the right. - She is very confused  to exam.  Differential diagnosis includes metabolic encephalopathy once organic lesions are ruled out. - I have called ER and talked to Dr. Sabra Heck who kindly agreed to see this patient.  She will need MRI of the thoracic spine with and without contrast and MRI of the brain with and without contrast emergently. - If there is any extradural cord compression, she will need neurosurgical evaluation stat.  2.  Bone metastasis: -She received Zometa on 05/06/2018.        Orders placed this encounter:  Orders Placed This Encounter  Procedures  . MR Thoracic Spine W Wo Contrast  . MR Brain W [place], MD [place] [phone]

## 2018-05-13 NOTE — ED Triage Notes (Addendum)
Sent from cancer center.  Increased confusion since the week of thanksgiving.  Pt is currently being tx breast ca.

## 2018-05-13 NOTE — Assessment & Plan Note (Addendum)
1.  Metastatic HER-2 positive breast cancer to the bones and brain: - Patient took Xeloda and Tykerb for over a year, Xeloda discontinued secondary to poor tolerance. - Herceptin and Tykerb from 07/23/2017 through October 2019 -PET/CT scan on 02/21/2018 showed worsening disease with increasing T10 and right scapular lesions, new abnormal uptake within T11 vertebra. -Brain MRI on 02/23/2018 did not show any progression. -MRI T-spine on 03/11/2018 shows osseous metastatic disease most notable at T10, there is a left preferential epidural tumor extension causing left foraminal impingement at T10-11.  She reportedly completed radiation to the spine during the week of Thanksgiving. - Kadcyla started on 03/04/2018, cycle 4 on 05/06/2018. -Patient was brought in by her husband today as she is feeling very weak particularly in the legs.  She reportedly lost control of her bladder for the last few days. -She sustained a fall when she was trying to get up from the commode on Tuesday.  She went to the ER and a CT of the head and C-spine were done which did not show significant findings. -She is also progressively getting confused.  She is not eating much.  As per the husband, she is seeing people. - Physical examination today showed left leg weakness more than the right. - She is very confused to exam.  Differential diagnosis includes metabolic encephalopathy once organic lesions are ruled out. - I have called ER and talked to Dr. Sabra Heck who kindly agreed to see this patient.  She will need MRI of the thoracic spine with and without contrast and MRI of the brain with and without contrast emergently. - If there is any extradural cord compression, she will need neurosurgical evaluation stat.  2.  Bone metastasis: -She received Zometa on 05/06/2018.

## 2018-05-13 NOTE — ED Notes (Signed)
Pt given crackers, peanut butter, and ginger ale. Pt sitting in chair at the bedside with family.

## 2018-05-14 DIAGNOSIS — E876 Hypokalemia: Secondary | ICD-10-CM

## 2018-05-14 DIAGNOSIS — C799 Secondary malignant neoplasm of unspecified site: Secondary | ICD-10-CM | POA: Diagnosis not present

## 2018-05-14 DIAGNOSIS — K7290 Hepatic failure, unspecified without coma: Secondary | ICD-10-CM | POA: Diagnosis not present

## 2018-05-14 DIAGNOSIS — C50919 Malignant neoplasm of unspecified site of unspecified female breast: Secondary | ICD-10-CM | POA: Diagnosis not present

## 2018-05-14 DIAGNOSIS — Z7189 Other specified counseling: Secondary | ICD-10-CM

## 2018-05-14 DIAGNOSIS — G9341 Metabolic encephalopathy: Secondary | ICD-10-CM

## 2018-05-14 LAB — AMMONIA: Ammonia: 45 umol/L — ABNORMAL HIGH (ref 9–35)

## 2018-05-14 LAB — COMPREHENSIVE METABOLIC PANEL
ALT: 21 U/L (ref 0–44)
AST: 59 U/L — ABNORMAL HIGH (ref 15–41)
Albumin: 3.1 g/dL — ABNORMAL LOW (ref 3.5–5.0)
Alkaline Phosphatase: 132 U/L — ABNORMAL HIGH (ref 38–126)
Anion gap: 8 (ref 5–15)
BUN: 6 mg/dL (ref 6–20)
CO2: 25 mmol/L (ref 22–32)
Calcium: 8.6 mg/dL — ABNORMAL LOW (ref 8.9–10.3)
Chloride: 107 mmol/L (ref 98–111)
Creatinine, Ser: 0.7 mg/dL (ref 0.44–1.00)
GFR calc Af Amer: >60 mL/min (ref >60)
GFR calc non Af Amer: >60 mL/min (ref >60)
Glucose, Bld: 137 mg/dL — ABNORMAL HIGH (ref 70–99)
Potassium: 2.9 mmol/L — ABNORMAL LOW (ref 3.5–5.1)
SODIUM: 140 mmol/L (ref 135–145)
Total Bilirubin: 0.6 mg/dL (ref 0.3–1.2)
Total Protein: 6.6 g/dL (ref 6.5–8.1)

## 2018-05-14 LAB — TSH: TSH: 1.813 u[IU]/mL (ref 0.350–4.500)

## 2018-05-14 LAB — CBC
HCT: 26.6 % — ABNORMAL LOW (ref 36.0–46.0)
Hemoglobin: 7.5 g/dL — ABNORMAL LOW (ref 12.0–15.0)
MCH: 23.5 pg — AB (ref 26.0–34.0)
MCHC: 28.2 g/dL — ABNORMAL LOW (ref 30.0–36.0)
MCV: 83.4 fL (ref 80.0–100.0)
PLATELETS: 130 10*3/uL — AB (ref 150–400)
RBC: 3.19 MIL/uL — ABNORMAL LOW (ref 3.87–5.11)
RDW: 20.3 % — ABNORMAL HIGH (ref 11.5–15.5)
WBC: 4.6 10*3/uL (ref 4.0–10.5)
nRBC: 0.6 % — ABNORMAL HIGH (ref 0.0–0.2)

## 2018-05-14 LAB — VITAMIN B12: Vitamin B-12: 279 pg/mL (ref 180–914)

## 2018-05-14 LAB — T4, FREE: Free T4: 0.96 ng/dL (ref 0.82–1.77)

## 2018-05-14 LAB — FOLATE: Folate: 12.8 ng/mL (ref >5.9)

## 2018-05-14 MED ORDER — SODIUM CHLORIDE 0.9 % IV SOLN
1.0000 g | INTRAVENOUS | Status: DC
  Administered 2018-05-14 – 2018-05-16 (×3): 1 g via INTRAVENOUS
  Filled 2018-05-14 (×3): qty 1
  Filled 2018-05-14: qty 10

## 2018-05-14 MED ORDER — LACTULOSE 10 GM/15ML PO SOLN
20.0000 g | Freq: Three times a day (TID) | ORAL | Status: DC
  Administered 2018-05-14 – 2018-05-16 (×7): 20 g via ORAL
  Filled 2018-05-14 (×7): qty 30

## 2018-05-14 MED ORDER — LORAZEPAM 2 MG/ML IJ SOLN
0.5000 mg | Freq: Four times a day (QID) | INTRAMUSCULAR | Status: DC | PRN
  Administered 2018-05-15 (×2): 0.5 mg via INTRAVENOUS
  Filled 2018-05-14 (×2): qty 1

## 2018-05-14 MED ORDER — MORPHINE SULFATE ER 30 MG PO TBCR
30.0000 mg | EXTENDED_RELEASE_TABLET | Freq: Two times a day (BID) | ORAL | Status: DC
  Administered 2018-05-14 – 2018-05-16 (×5): 30 mg via ORAL
  Filled 2018-05-14 (×5): qty 1

## 2018-05-14 MED ORDER — LORAZEPAM 2 MG/ML IJ SOLN
0.5000 mg | Freq: Once | INTRAMUSCULAR | Status: AC
  Administered 2018-05-14: 0.5 mg via INTRAVENOUS
  Filled 2018-05-14: qty 1

## 2018-05-14 MED ORDER — POTASSIUM CHLORIDE IN NACL 40-0.9 MEQ/L-% IV SOLN
INTRAVENOUS | Status: AC
  Administered 2018-05-14: 75 mL/h via INTRAVENOUS

## 2018-05-14 MED ORDER — OXYCODONE HCL 5 MG PO TABS: 5.0000 mg | ORAL_TABLET | Freq: Four times a day (QID) | ORAL | Status: DC | PRN

## 2018-05-14 NOTE — Progress Notes (Addendum)
PROGRESS NOTE  [name] JAS:[id number] DOB: 24-Apr-1966 DOA: 05/13/2018 PCP: [name], MD  Brief History:  53 year old female with a history of metastatic breast cancer to the liver, brain and vertebra, DVT on rivaroxaban, anxiety presenting with increasing confusion and generalized weakness.  According to patient's family, the patient has had increasing confusion, hallucinations, and urinary incontinence.  Her husband states that the patient has had some mild confusion since finishing radiation around Thanksgiving time.  However in the past week, the patient's confusion has worsened with increasing somnolence.  However the patient is arousable and able to answer questions although she is confused.  In addition, the patient has had increasing lower extremity weakness resulting in gait instability and mechanical falls.  Apparently the patient had a mechanical fall on 05/10/2018.  She presented emergency department where CT of the brain and cervical spine were unremarkable.  Her husband states that the patient is still able to ambulate.  In fact, the patient has become restless where she has been getting up off the couch impulsively without asking for assistance.  The patient has had decreasing oral intake and hallucinations.  She went to see her medical oncologist on 05/13/2017.  She was sent to the emergency department where MRI of the brain, thoracic, and lumbar spines were performed.  MRI brain was negative for any acute findings.  MRI of thoracic spine did not show any new lesions compared to previous study in November 2019.  In particular, there is no extraosseous tumor or spinal cord compression.  MRI of the lumbar spine showed scattered metastatic disease without pathologic fracture or extraosseous tumor.  Laboratory blood work showed potassium 3.3.  Otherwise BMP was unremarkable.  Ammonia level was noted to be 68.  Lactic acid was 1.0.  CBC was essentially unremarkable with  hemoglobin of 8.2 and platelets 152,000.  Assessment/Plan: Acute metabolic encephalopathy -Multifactorial including hepatic encephalopathy, dehydration, medication induced (patient on significant opioids and hypnotics) and possible UTI -TSH -Serum A19 -Folic acid  -If there is no improvement with correction of her metabolic abnormalities, she may need neurology consult for evaluation of paraneoplastic syndrome  Hyperammonemia/hepatic encephalopathy -Start lactulose -If there is no improvement with correction of her metabolic abnormalities, she may need neurology consult for evaluation of paraneoplastic syndrome -Previous CTs have shown a nodular liver suggesting she may have had some underlying liver cirrhosis -09/21/2017 CT abdomen showed improvement of her hepatic metastasis, but persistent 2.3 cm right hepatic lobe mets  Hypokalemia -Replete -Check magnesium  Pyuria -Start empiric ceftriaxone pending urine culture data  History of DVT -Continue rivaroxaban  Metastatic breast cancer -Metastasis to brain, liver, and vertebrae -Patient follows with Dr. Delton Coombes -Kadcyla started on 03/04/2018, cycle 4 on 05/06/2018.  Goals of Care -FULL CODE Advance care planning, including the explanation and discussion of advance directives was carried out with the patient and family.  Code status including explanations of "Full Code" and "DNR" and alternatives were discussed in detail.  Discussion of end-of-life issues including but not limited palliative care, hospice care and the concept of hospice, other end-of-life care options, power of attorney for health care decisions, living wills, and physician orders for life-sustaining treatment were also discussed with the patient and family.  Total face to face time 16 minutes.     Disposition Plan:   Home in 2-3days  Family Communication:   Spouse update at bedside 1/4  Consultants:  none  Code Status:  FULL  DVT Prophylaxis:    Rivaroxaban   Procedures: As Listed in Progress Note Above  Antibiotics: Ceftriaxone 1/4>>>  Total time spent 45 minutes.  Greater than 50% spent face to face counseling and coordinating care.      Subjective: The patient remains confused.  She denies any headache, chest pain, shortness of breath, nausea, vomiting, dysuria.  She has some abdominal pain.  Remainder review of systems is limited secondary patient's confusion.  Objective: Vitals:   05/13/18 1745 05/13/18 2044 05/13/18 2047 05/14/18 0700  BP: (!) 117/50 (!) 105/44  120/60  Pulse: 81 82  90  Resp: 18     Temp:  98.3 F (36.8 C)  98 F (36.7 C)  TempSrc:  Oral    SpO2: 98% 95% 97% 98%  Weight:      Height:        Intake/Output Summary (Last 24 hours) at 05/14/2018 0734 Last data filed at 05/14/2018 0300 Gross per 24 hour  Intake 667.26 ml  Output -  Net 667.26 ml   Weight change:  Exam:   General:  Pt is alert, follows commands appropriately, not in acute distress  HEENT: No icterus, No thrush, No neck mass, Kappa/AT  Cardiovascular: RRR, S1/S2, no rubs, no gallops  Respiratory: Poor inspiratory effort but clear to auscultation.  No wheezing.  Abdomen: Soft/+BS,  mild old RUQ tender, non distended, no guarding  Extremities: Trace lower extremity edema, No lymphangitis, No petechiae, No rashes, no synovitis   Data Reviewed: I have personally reviewed following labs and imaging studies Basic Metabolic Panel: Recent Labs  Lab 05/10/18 1240 05/13/18 1242 05/13/18 1556  NA 139 138  --   K 3.4* 3.3*  --   CL 102 100  --   CO2 27 29  --   GLUCOSE 122* 135*  --   BUN 6 6  --   CREATININE 0.74 0.85  --   CALCIUM 8.7* 8.9  --   MG  --   --  2.1  PHOS  --   --  3.4   Liver Function Tests: Recent Labs  Lab 05/10/18 1240 05/13/18 1242  AST 57* 63*  ALT 25 25  ALKPHOS 133* 141*  BILITOT 0.7 0.8  PROT 7.0 7.1  ALBUMIN 3.4* 3.4*   No results for input(s): LIPASE, AMYLASE in the last 168  hours. Recent Labs  Lab 05/13/18 1241  AMMONIA 68*   Coagulation Profile: No results for input(s): INR, PROTIME in the last 168 hours. CBC: Recent Labs  Lab 05/10/18 1240 05/13/18 1242 05/14/18 0709  WBC 5.3 4.4 4.6  NEUTROABS 3.9 3.2  --   HGB 8.4* 8.2* 7.5*  HCT 29.3* 28.4* 26.6*  MCV 82.3 83.3 83.4  PLT 138* 152 130*   Cardiac Enzymes: Recent Labs  Lab 05/13/18 1556  TROPONINI <0.03   BNP: Invalid input(s): POCBNP CBG: No results for input(s): GLUCAP in the last 168 hours. HbA1C: No results for input(s): HGBA1C in the last 72 hours. Urine analysis:    Component Value Date/Time   COLORURINE AMBER (A) 05/13/2018 1242   APPEARANCEUR HAZY (A) 05/13/2018 1242   LABSPEC 1.024 05/13/2018 1242   PHURINE 5.0 05/13/2018 1242   GLUCOSEU NEGATIVE 05/13/2018 1242   HGBUR NEGATIVE 05/13/2018 1242   BILIRUBINUR NEGATIVE 05/13/2018 1242   KETONESUR NEGATIVE 05/13/2018 1242   PROTEINUR NEGATIVE 05/13/2018 1242   UROBILINOGEN 0.2 01/23/2015 1050   NITRITE NEGATIVE 05/13/2018 1242   LEUKOCYTESUR TRACE (A) 05/13/2018 1242   Sepsis Labs: @LABRCNTIP (procalcitonin:4,lacticidven:4) )  No results found for this or any previous visit (from the past 240 hour(s)).   Scheduled Meds: . pantoprazole  40 mg Oral Q12H  . [START ON 05/15/2018] potassium chloride SA  40 mEq Oral Daily  . rivaroxaban  20 mg Oral Q supper   Continuous Infusions:  Procedures/Studies: Dg Elbow Complete Right  Result Date: 05/10/2018 CLINICAL DATA:  Pain and bruising to the right elbow secondary to a fall in her bathroom today. EXAM: RIGHT ELBOW - COMPLETE 3+ VIEW COMPARISON:  None. FINDINGS: There is no evidence of fracture, dislocation, or joint effusion. Tiny osteophyte on the coronoid process of the proximal ulna. Soft tissues are unremarkable. IMPRESSION: No acute abnormalities. Electronically Signed   By: Lorriane Shire M.D.   On: 05/10/2018 13:53   Ct Head Wo Contrast  Result Date:  05/10/2018 CLINICAL DATA:  Status post fall in the bathroom this morning. Patient has a history of stage IV breast cancer. Patient complains pain of the neck and bilateral arms and nausea. EXAM: CT HEAD WITHOUT CONTRAST CT CERVICAL SPINE WITHOUT CONTRAST TECHNIQUE: Multidetector CT imaging of the head and cervical spine was performed following the standard protocol without intravenous contrast. Multiplanar CT image reconstructions of the cervical spine were also generated. COMPARISON:  MRI of the brain February 23, 2018, head CT April 25, 2018 FINDINGS: CT HEAD FINDINGS Brain: No evidence of acute infarction, hemorrhage, hydrocephalus, extra-axial collection or mass lesion/mass effect. There is chronic diffuse atrophy. Vascular: No hyperdense vessel or unexpected calcification. Skull: There is a subcentimeter sclerotic focus in the upper left clivus stable compared to prior exams. Patient has known multifocal osseous metastatic disease. Sinuses/Orbits: No acute finding. Other: None. CT CERVICAL SPINE FINDINGS Alignment: Normal. Patient status post prior anterior fusion of C5-6 without malalignment. Skull base and vertebrae: There are several subcentimeter sclerotic lesions throughout the cervical spine consistent with patient's known osseous metastasis from breast cancer. Soft tissues and spinal canal: No prevertebral fluid or swelling. No visible canal hematoma. Disc levels: There are narrowed intervertebral spaces throughout cervical spine consistent with degenerative joint changes. Upper chest: Negative. Other: None. IMPRESSION: No focal acute intracranial abnormality identified. Focal sclerotic focus in the upper left clivus, and throughout the cervical spine consistent with patient's known osseous metastasis from breast cancer. No acute fracture or dislocation of cervical spine. Electronically Signed   By: Abelardo Diesel M.D.   On: 05/10/2018 14:16   Ct Head W Wo Contrast  Result Date:  04/25/2018 CLINICAL DATA:  Headache.  History of metastatic breast cancer. These results will be called to the ordering clinician or representative by the Radiology Department at the imaging location. EXAM: CT HEAD WITHOUT AND WITH CONTRAST TECHNIQUE: Contiguous axial images were obtained from the base of the skull through the vertex without and with intravenous contrast CONTRAST:  55mL OMNIPAQUE IOHEXOL 300 MG/ML  SOLN COMPARISON:  Brain MRI 02/23/2018 FINDINGS: Brain: No evidence of acute infarction, hemorrhage, hydrocephalus, extra-axial collection or mass lesion/mass effect. Chronic lacune at the right caudate head Vascular: Visible vessels are patent. Lobulated enhancement in the left middle cranial fossa is sphenoparietal sinus based on prior brain MRI. Skull: Subcentimeter sclerotic focus in the upper left clivus, stable from comparison brain MRI. Patient has known multifocal osseous metastatic disease by bone scan 03/02/2018 Sinuses/Orbits: Negative IMPRESSION: 1. Stable from brain MRI 02/23/2018. 2. No evidence of recurrent intracranial metastasis. 3. Subcentimeter bone metastasis in the clivus. Electronically Signed   By: Monte Fantasia M.D.   On: 04/25/2018 16:45  Ct Cervical Spine Wo Contrast  Result Date: 05/10/2018 CLINICAL DATA:  Status post fall in the bathroom this morning. Patient has a history of stage IV breast cancer. Patient complains pain of the neck and bilateral arms and nausea. EXAM: CT HEAD WITHOUT CONTRAST CT CERVICAL SPINE WITHOUT CONTRAST TECHNIQUE: Multidetector CT imaging of the head and cervical spine was performed following the standard protocol without intravenous contrast. Multiplanar CT image reconstructions of the cervical spine were also generated. COMPARISON:  MRI of the brain February 23, 2018, head CT April 25, 2018 FINDINGS: CT HEAD FINDINGS Brain: No evidence of acute infarction, hemorrhage, hydrocephalus, extra-axial collection or mass lesion/mass effect. There  is chronic diffuse atrophy. Vascular: No hyperdense vessel or unexpected calcification. Skull: There is a subcentimeter sclerotic focus in the upper left clivus stable compared to prior exams. Patient has known multifocal osseous metastatic disease. Sinuses/Orbits: No acute finding. Other: None. CT CERVICAL SPINE FINDINGS Alignment: Normal. Patient status post prior anterior fusion of C5-6 without malalignment. Skull base and vertebrae: There are several subcentimeter sclerotic lesions throughout the cervical spine consistent with patient's known osseous metastasis from breast cancer. Soft tissues and spinal canal: No prevertebral fluid or swelling. No visible canal hematoma. Disc levels: There are narrowed intervertebral spaces throughout cervical spine consistent with degenerative joint changes. Upper chest: Negative. Other: None. IMPRESSION: No focal acute intracranial abnormality identified. Focal sclerotic focus in the upper left clivus, and throughout the cervical spine consistent with patient's known osseous metastasis from breast cancer. No acute fracture or dislocation of cervical spine. Electronically Signed   By: Abelardo Diesel M.D.   On: 05/10/2018 14:16   Mr Brain Wo Contrast  Result Date: 05/13/2018 CLINICAL DATA:  Metastatic breast cancer with worsening confusion. EXAM: MRI HEAD WITHOUT CONTRAST TECHNIQUE: Multiplanar, multiecho pulse sequences of the brain and surrounding structures were obtained without intravenous contrast. COMPARISON:  Head CT 05/10/2018 and MRI 02/23/2018 FINDINGS: The patient terminated the examination before IV contrast was administered. A complete noncontrast brain MRI was obtained. Brain: There is no evidence of acute infarct, intracranial mass effect, or extra-axial fluid collection. There is mild cerebral atrophy. 3 mm T2 hyperintense focus in the right caudate head is unchanged and corresponds to a treated metastasis. T2 hyperintensities in the cerebral white matter  bilaterally are unchanged and nonspecific but may reflect mild chronic small vessel ischemic disease or post treatment changes. Evaluation for brain metastases is limited by lack of IV contrast, however no new area vasogenic edema is present. A partially empty sella and a chronic microhemorrhage in the medial left parietal lobe are unchanged. Vascular: Major intracranial vascular flow voids are preserved. Skull and upper cervical spine: Heterogeneous bone marrow signal in the clivus with a sclerotic metastasis shown on CT. Sinuses/Orbits: Bilateral cataract extraction. Paranasal sinuses and mastoid air cells are clear. Other: None. IMPRESSION: 1. No acute intracranial abnormality. 2. Limited assessment for intracranial metastatic disease as the patient terminated the examination before IV contrast was administered. No mass lesion or vasogenic edema on this unenhanced study. Electronically Signed   By: Logan Bores M.D.   On: 05/13/2018 17:34   Mr Thoracic Spine Wo Contrast  Result Date: 05/13/2018 CLINICAL DATA:  Severe back pain. EXAM: MRI THORACIC SPINE WITHOUT CONTRAST TECHNIQUE: Multiplanar, multisequence MR imaging of the thoracic spine was performed. No intravenous contrast was administered. COMPARISON:  Earlier same day.  03/11/2018. FINDINGS: The patient could not do any better with imaging on this attempt. Again, we obtained only motion degraded noncontrast imaging.  We did achieve some motion degraded axial imaging. Alignment: Normal Vertebrae: Treated metastatic disease as seen previously. Compared to the study of last November, no new lesions are evident. There is no evidence of pathologic fracture. Disease is most extensive within the T7 vertebral body, the T10 vertebral body and the T11 vertebral body. Small lesion in the posterior left corner of T9 as seen previously. Other small scattered lesions as seen previously. There is no evidence of extraosseous disease on this exam. The canal and foramina  appear sufficiently patent. No specific cause of acutely worsened pain is identified. Cord:  No cord compression or cord lesion identified. Paraspinal and other soft tissues: Otherwise negative Disc levels: No evidence of disc herniation in the thoracic region. IMPRESSION: This is again in abbreviated motion degraded exam. No contrast was achieved. Axial images are motion degraded. No new lesions are seen when compared to the study of last November. Treated metastatic lesions as outlined above. No evidence of extraosseous tumor. No evidence of pathologic fracture. No evidence of spinal cord or nerve root compression. Electronically Signed   By: Nelson Chimes M.D.   On: 05/13/2018 19:29   Mr Thoracic Spine Wo Contrast  Result Date: 05/13/2018 CLINICAL DATA:  Back pain.  Metastatic breast cancer. EXAM: MRI THORACIC SPINE WITHOUT CONTRAST TECHNIQUE: Multiplanar, multisequence MR imaging of the thoracic spine was performed. No intravenous contrast was administered. COMPARISON:  03/11/2018 FINDINGS: The patient terminated the examination prematurely. Only sagittal T1 and sagittal T2 sequences were obtained and these are motion degraded (severely so on the T1 sequence. IV contrast was not administered. Alignment: No significant listhesis. Vertebrae: Grossly similar size of multiple bone metastases including a large sclerotic lesion in the T7 vertebral body and right pedicle and extensive lesion involving the T10 vertebral body and posterior elements. Small volume epidural tumor at T10 on the prior study is inadequately evaluated on this study though no gross epidural tumor is evident. Preserved vertebral body heights without evidence of fracture on this limited examination. Partially visualized small metastases in the upper lumbar spine, cervical spine, and ribs. Cord: Grossly normal cord signal and morphology on the motion degraded sagittal T2 sequence. Paraspinal and other soft tissues: Grossly unremarkable. Disc  levels: Similar appearance of very mild thoracic spondylosis. More prominent disc bulging/broad posterior disc protrusion at L1-2 without evidence of compressive spinal stenosis. IMPRESSION: 1. Incomplete, motion degraded noncontrast examination. 2. Similar appearance of multiple bone metastases. No gross epidural tumor. Previously demonstrated small volume epidural tumor at T10 inadequately reassessed on this study. Electronically Signed   By: Logan Bores M.D.   On: 05/13/2018 18:13   Mr Lumbar Spine Wo Contrast  Result Date: 05/13/2018 CLINICAL DATA:  Followup metastatic breast cancer. EXAM: MRI LUMBAR SPINE WITHOUT CONTRAST TECHNIQUE: Multiplanar, multisequence MR imaging of the lumbar spine was performed. No intravenous contrast was administered. COMPARISON:  CT 09/21/2017. FINDINGS: The patient could not tolerate complete imaging. Sagittal T1 and T2 imaging was performed. Very motion degraded axial imaging is performed. lSegmentation: 5 lumbar type vertebral bodies presumed. Alignment:  Normal Vertebrae: Metastatic lesions scattered throughout the lumbar spine and sacrum. Most of these are small in the lumbar region, less than a cm. Exceptions to this include AA 1.5 cm metastasis within the anterior L4 vertebral body and a metastasis within the sacrum in the S2-S3 region measuring up to 3.4 cm in size. Conus medullaris and cauda equina: Conus extends to the L1 level. Conus and cauda equina appear normal. No evidence of neural  compression by tumor. Paraspinal and other soft tissues: Negative as seen. Disc levels: Central disc herniation at L1-2 that indents the thecal sac but does not appear to compress the neural structures. Shallow disc protrusion at L4-5. Facet osteoarthritis at L3-4, L4-5 and L5-S1. No apparent neural compressive stenosis. IMPRESSION: Scattered metastatic disease throughout the lumbar spine and the sacrum. No evidence of pathologic fracture or extraosseous tumor causing neural  compression. Largest lesion in the lumbar region is 1.5 cm metastasis within the anterior L4 vertebral body. Sacral disease at S2 and S3 measures up to 3.4 cm in size. Note that this examination is limited by patient discomfort and only sagittal T1 and T2 imaging was achieved. Axial imaging is severely motion degraded. Electronically Signed   By: Nelson Chimes M.D.   On: 05/13/2018 19:15   Dg Chest Port 1 View  Result Date: 05/13/2018 CLINICAL DATA:  Altered mental status.  Metastatic breast carcinoma EXAM: PORTABLE CHEST 1 VIEW COMPARISON:  Chest radiograph Sep 28, 2016; chest CT March 28, 2018 FINDINGS: Port-A-Cath tip is in the superior vena cava. No pneumothorax. No evident edema or consolidation. Heart size and pulmonary vascular normal. No adenopathy. There is postoperative change in the lower cervical region. There is an expansile lesion in the anterior right sixth rib, suspicious for metastatic focus. Thoracic spine sclerotic lesions seen on prior CT are not well seen on this portable chest radiographic examination. IMPRESSION: Apparent metastatic focus in the anterior right sixth rib. No edema or consolidation. No adenopathy demonstrable by radiography. Electronically Signed   By: Lowella Grip III M.D.   On: 05/13/2018 16:18   Dg Shoulder Left  Result Date: 05/10/2018 CLINICAL DATA:  Left shoulder pain secondary to a fall in the bathroom today. EXAM: LEFT SHOULDER - 2+ VIEW COMPARISON:  MRI dated 02/01/2018 and radiographs dated 02/15/2015 FINDINGS: There is no acute fracture. No dislocation. Old nonunion fracture of the base of the acromion. IMPRESSION: No acute abnormality. Old nonunion fracture of the base of the acromion. Electronically Signed   By: Lorriane Shire M.D.   On: 05/10/2018 13:51    Orson Eva, DO  [facility] Pager [phone]  If 7PM-7AM, please contact night-coverage [email or web address] Password [email or web address] 05/14/2018, 7:34 AM   LOS: 0 days

## 2018-05-15 DIAGNOSIS — K7290 Hepatic failure, unspecified without coma: Secondary | ICD-10-CM | POA: Diagnosis not present

## 2018-05-15 DIAGNOSIS — C50919 Malignant neoplasm of unspecified site of unspecified female breast: Secondary | ICD-10-CM | POA: Diagnosis not present

## 2018-05-15 DIAGNOSIS — Z7189 Other specified counseling: Secondary | ICD-10-CM | POA: Diagnosis not present

## 2018-05-15 DIAGNOSIS — G9341 Metabolic encephalopathy: Secondary | ICD-10-CM | POA: Diagnosis not present

## 2018-05-15 DIAGNOSIS — E876 Hypokalemia: Secondary | ICD-10-CM | POA: Diagnosis not present

## 2018-05-15 LAB — COMPREHENSIVE METABOLIC PANEL
ALK PHOS: 128 U/L — AB (ref 38–126)
ALT: 22 U/L (ref 0–44)
AST: 64 U/L — ABNORMAL HIGH (ref 15–41)
Albumin: 3.2 g/dL — ABNORMAL LOW (ref 3.5–5.0)
Anion gap: 6 (ref 5–15)
BUN: 5 mg/dL — ABNORMAL LOW (ref 6–20)
CO2: 23 mmol/L (ref 22–32)
Calcium: 8.4 mg/dL — ABNORMAL LOW (ref 8.9–10.3)
Chloride: 108 mmol/L (ref 98–111)
Creatinine, Ser: 0.8 mg/dL (ref 0.44–1.00)
GFR calc Af Amer: >60 mL/min (ref >60)
GFR calc non Af Amer: >60 mL/min (ref >60)
Glucose, Bld: 120 mg/dL — ABNORMAL HIGH (ref 70–99)
Potassium: 2.7 mmol/L — CL (ref 3.5–5.1)
SODIUM: 137 mmol/L (ref 135–145)
Total Bilirubin: 0.6 mg/dL (ref 0.3–1.2)
Total Protein: 6.6 g/dL (ref 6.5–8.1)

## 2018-05-15 LAB — URINE CULTURE

## 2018-05-15 LAB — CBC
HCT: 27.1 % — ABNORMAL LOW (ref 36.0–46.0)
Hemoglobin: 7.7 g/dL — ABNORMAL LOW (ref 12.0–15.0)
MCH: 23.6 pg — ABNORMAL LOW (ref 26.0–34.0)
MCHC: 28.4 g/dL — ABNORMAL LOW (ref 30.0–36.0)
MCV: 83.1 fL (ref 80.0–100.0)
Platelets: 141 10*3/uL — ABNORMAL LOW (ref 150–400)
RBC: 3.26 MIL/uL — ABNORMAL LOW (ref 3.87–5.11)
RDW: 20.7 % — ABNORMAL HIGH (ref 11.5–15.5)
WBC: 5.4 10*3/uL (ref 4.0–10.5)
nRBC: 0.7 % — ABNORMAL HIGH (ref 0.0–0.2)

## 2018-05-15 LAB — AMMONIA: Ammonia: 45 umol/L — ABNORMAL HIGH (ref 9–35)

## 2018-05-15 MED ORDER — MAGNESIUM SULFATE 2 GM/50ML IV SOLN
2.0000 g | Freq: Once | INTRAVENOUS | Status: AC
  Administered 2018-05-15: 2 g via INTRAVENOUS
  Filled 2018-05-15: qty 50

## 2018-05-15 MED ORDER — ACETAMINOPHEN 325 MG PO TABS
650.0000 mg | ORAL_TABLET | Freq: Once | ORAL | Status: AC
  Administered 2018-05-15: 650 mg via ORAL
  Filled 2018-05-15: qty 2

## 2018-05-15 MED ORDER — POTASSIUM CHLORIDE CRYS ER 20 MEQ PO TBCR
40.0000 meq | EXTENDED_RELEASE_TABLET | Freq: Once | ORAL | Status: AC
  Administered 2018-05-15: 40 meq via ORAL
  Filled 2018-05-15: qty 2

## 2018-05-15 MED ORDER — POLYVINYL ALCOHOL 1.4 % OP SOLN
1.0000 [drp] | OPHTHALMIC | Status: DC | PRN
  Administered 2018-05-15: 1 [drp] via OPHTHALMIC
  Filled 2018-05-15: qty 15

## 2018-05-15 NOTE — Progress Notes (Signed)
PROGRESS NOTE  [name] KDX:[id number] DOB: 14-Nov-1965 DOA: 05/13/2018 PCP: [name], MD  Brief History:  53 year old female with a history of metastatic breast cancer to the liver, brain and vertebra, DVT on rivaroxaban, anxiety presenting with increasing confusion and generalized weakness.  According to patient's family, the patient has had increasing confusion, hallucinations, and urinary incontinence.  Her husband states that the patient has had some mild confusion since finishing radiation around Thanksgiving time.  However in the past week, the patient's confusion has worsened with increasing somnolence.  However the patient is arousable and able to answer questions although she is confused.  In addition, the patient has had increasing lower extremity weakness resulting in gait instability and mechanical falls.  Apparently the patient had a mechanical fall on 05/10/2018.  She presented emergency department where CT of the brain and cervical spine were unremarkable.  Her husband states that the patient is still able to ambulate.  In fact, the patient has become restless where she has been getting up off the couch impulsively without asking for assistance.  The patient has had decreasing oral intake and hallucinations.  She went to see her medical oncologist on 05/13/2017.  She was sent to the emergency department where MRI of the brain, thoracic, and lumbar spines were performed.  MRI brain was negative for any acute findings.  MRI of thoracic spine did not show any new lesions compared to previous study in November 2019.  In particular, there is no extraosseous tumor or spinal cord compression.  MRI of the lumbar spine showed scattered metastatic disease without pathologic fracture or extraosseous tumor.  Laboratory blood work showed potassium 3.3.  Otherwise BMP was unremarkable.  Ammonia level was noted to be 68.  Lactic acid was 1.0.  CBC was essentially unremarkable with  hemoglobin of 8.2 and platelets 152,000.  Assessment/Plan: Acute metabolic encephalopathy -Multifactorial including hepatic encephalopathy, dehydration, medication induced (patient on significant opioids and hypnotics) and possible UTI -TSH--1.813 -Serum Z76--734 -Folic acid --19.3 -If there is no improvement with correction of her metabolic abnormalities, she may need neurology consult for evaluation of paraneoplastic syndrome -she is more alert, but remains confused which is not much better -her cognitive and functional decline has impaired her ability to care for herself, and she may require SNF for a short time -I offered transfer of patient to [facility] or [facility]--pt's husband wants to hold off on that for now  Hyperammonemia/hepatic encephalopathy -Increase lactulose -If there is no improvement with correction of her metabolic abnormalities, she may need neurology consult for evaluation of paraneoplastic syndrome -Previous CTs have shown a nodular liver suggesting she may have had some underlying liver cirrhosis -09/21/2017 CT abdomen showed improvement of her hepatic metastasis, but persistent 2.3 cm right hepatic lobe mets  Hypokalemia -Replete -Check magnesium  Pyuria/UTI -Continue empiric ceftriaxone pending urine culture data  History of DVT -Continue rivaroxaban  Metastatic breast cancer -Metastasis to brain, liver, and vertebrae -Patient follows with Dr. Delton Coombes -Kadcyla started on 03/04/2018, cycle 4 on 05/06/2018. -continue home MS Contin and oxycodone  Goals of Care -FULL CODE Advance care planning, including the explanation and discussion of advance directives was carried out with the patient and family.  Code status including explanations of "Full Code" and "DNR" and alternatives were discussed in detail.  Discussion of end-of-life issues including but not limited palliative care, hospice care and the concept of hospice, other end-of-life care options,  power  of attorney for health care decisions, living wills, and physician orders for life-sustaining treatment were also discussed with the patient and family.  Total face to face time 16 minutes.     Disposition Plan:   Home in 1-2 days  Family Communication:   Spouse update at bedside 1/5  Consultants:  none  Code Status:  FULL   DVT Prophylaxis:   Rivaroxaban   Procedures: As Listed in Progress Note Above  Antibiotics: Ceftriaxone 1/4>>>  Total time spent 45 minutes.  Greater than 50% spent face to face counseling and coordinating care.    Subjective: Pt is more alert but remains confused.  She follows commands but is impulsive.  Patient denies fevers, chills, headache, chest pain, dyspnea, nausea, vomiting, diarrhea, abdominal pain, dysuria, hematuria, hematochezia, and melena.   Objective: Vitals:   05/14/18 0700 05/14/18 1451 05/14/18 2059 05/15/18 0608  BP: 120/60 112/61 (!) 106/45 (!) 117/49  Pulse: 90 88 79 78  Resp:  20 18 18   Temp: 98 F (36.7 C) 98.7 F (37.1 C) 99 F (37.2 C) 98.9 F (37.2 C)  TempSrc:   Oral Oral  SpO2: 98% 98% 98% 98%  Weight:      Height:        Intake/Output Summary (Last 24 hours) at 05/15/2018 1114 Last data filed at 05/15/2018 0900 Gross per 24 hour  Intake 1417.97 ml  Output -  Net 1417.97 ml   Weight change:  Exam:   General:  Pt is alert, follows commands appropriately, not in acute distress  HEENT: No icterus, No thrush, No neck mass, [place]/AT  Cardiovascular: RRR, S1/S2, no rubs, no gallops  Respiratory: CTA bilaterally, no wheezing, no crackles, no rhonchi  Abdomen: Soft/+BS, non tender, non distended, no guarding  Extremities: No edema, No lymphangitis, No petechiae, No rashes, no synovitis   Data Reviewed: I have personally reviewed following labs and imaging studies Basic Metabolic Panel: Recent Labs  Lab 05/10/18 1240 05/13/18 1242 05/13/18 1556 05/14/18 0709 05/15/18 0752  NA 139 138   --  140 137  K 3.4* 3.3*  --  2.9* 2.7*  CL 102 100  --  107 108  CO2 27 29  --  25 23  GLUCOSE 122* 135*  --  137* 120*  BUN 6 6  --  6 5*  CREATININE 0.74 0.85  --  0.70 0.80  CALCIUM 8.7* 8.9  --  8.6* 8.4*  MG  --   --  2.1  --   --   PHOS  --   --  3.4  --   --    Liver Function Tests: Recent Labs  Lab 05/10/18 1240 05/13/18 1242 05/14/18 0709 05/15/18 0752  AST 57* 63* 59* 64*  ALT 25 25 21 22   ALKPHOS 133* 141* 132* 128*  BILITOT 0.7 0.8 0.6 0.6  PROT 7.0 7.1 6.6 6.6  ALBUMIN 3.4* 3.4* 3.1* 3.2*   No results for input(s): LIPASE, AMYLASE in the last 168 hours. Recent Labs  Lab 05/13/18 1241 05/14/18 0917 05/15/18 0752  AMMONIA 68* 45* 45*   Coagulation Profile: No results for input(s): INR, PROTIME in the last 168 hours. CBC: Recent Labs  Lab 05/10/18 1240 05/13/18 1242 05/14/18 0709 05/15/18 0752  WBC 5.3 4.4 4.6 5.4  NEUTROABS 3.9 3.2  --   --   HGB 8.4* 8.2* 7.5* 7.7*  HCT 29.3* 28.4* 26.6* 27.1*  MCV 82.3 83.3 83.4 83.1  PLT 138* 152 130* 141*   Cardiac Enzymes: Recent  Labs  Lab 05/13/18 1556  TROPONINI <0.03   BNP: Invalid input(s): POCBNP CBG: No results for input(s): GLUCAP in the last 168 hours. HbA1C: No results for input(s): HGBA1C in the last 72 hours. Urine analysis:    Component Value Date/Time   COLORURINE AMBER (A) 05/13/2018 1242   APPEARANCEUR HAZY (A) 05/13/2018 1242   LABSPEC 1.024 05/13/2018 1242   PHURINE 5.0 05/13/2018 1242   GLUCOSEU NEGATIVE 05/13/2018 1242   HGBUR NEGATIVE 05/13/2018 1242   BILIRUBINUR NEGATIVE 05/13/2018 1242   KETONESUR NEGATIVE 05/13/2018 1242   PROTEINUR NEGATIVE 05/13/2018 1242   UROBILINOGEN 0.2 01/23/2015 1050   NITRITE NEGATIVE 05/13/2018 1242   LEUKOCYTESUR TRACE (A) 05/13/2018 1242   Sepsis Labs: @LABRCNTIP (procalcitonin:4,lacticidven:4) ) Recent Results (from the past 240 hour(s))  Urine Culture     Status: Abnormal   Collection Time: 05/13/18  3:32 PM  Result Value Ref Range  Status   Specimen Description   Final    URINE, CATHETERIZED Performed at [facility], [address] [place], [place] [address]    Special Requests   Final    NONE Performed at [facility], [address], [place], [place] [address]    Culture MULTIPLE SPECIES PRESENT, SUGGEST RECOLLECTION (A)  Final   Report Status 05/15/2018 FINAL  Final     Scheduled Meds: . lactulose  20 g Oral TID  . morphine  30 mg Oral Q12H  . pantoprazole  40 mg Oral Q12H  . potassium chloride SA  40 mEq Oral Daily  . potassium chloride  40 mEq Oral Once  . rivaroxaban  20 mg Oral Q supper   Continuous Infusions: . cefTRIAXone (ROCEPHIN)  IV Stopped (05/14/18 1114)    Procedures/Studies: Dg Elbow Complete Right  Result Date: 05/10/2018 CLINICAL DATA:  Pain and bruising to the right elbow secondary to a fall in her bathroom today. EXAM: RIGHT ELBOW - COMPLETE 3+ VIEW COMPARISON:  None. FINDINGS: There is no evidence of fracture, dislocation, or joint effusion. Tiny osteophyte on the coronoid process of the proximal ulna. Soft tissues are unremarkable. IMPRESSION: No acute abnormalities. Electronically Signed   By: Lorriane Shire M.D.   On: 05/10/2018 13:53   Ct Head Wo Contrast  Result Date: 05/10/2018 CLINICAL DATA:  Status post fall in the bathroom this morning. Patient has a history of stage IV breast cancer. Patient complains pain of the neck and bilateral arms and nausea. EXAM: CT HEAD WITHOUT CONTRAST CT CERVICAL SPINE WITHOUT CONTRAST TECHNIQUE: Multidetector CT imaging of the head and cervical spine was performed following the standard protocol without intravenous contrast. Multiplanar CT image reconstructions of the cervical spine were also generated. COMPARISON:  MRI of the brain February 23, 2018, head CT April 25, 2018 FINDINGS: CT HEAD FINDINGS Brain: No evidence of acute infarction, hemorrhage, hydrocephalus, extra-axial collection or mass lesion/mass effect. There is chronic diffuse  atrophy. Vascular: No hyperdense vessel or unexpected calcification. Skull: There is a subcentimeter sclerotic focus in the upper left clivus stable compared to prior exams. Patient has known multifocal osseous metastatic disease. Sinuses/Orbits: No acute finding. Other: None. CT CERVICAL SPINE FINDINGS Alignment: Normal. Patient status post prior anterior fusion of C5-6 without malalignment. Skull base and vertebrae: There are several subcentimeter sclerotic lesions throughout the cervical spine consistent with patient's known osseous metastasis from breast cancer. Soft tissues and spinal canal: No prevertebral fluid or swelling. No visible canal hematoma. Disc levels: There are narrowed intervertebral spaces throughout cervical spine consistent with degenerative joint changes. Upper chest:  Negative. Other: None. IMPRESSION: No focal acute intracranial abnormality identified. Focal sclerotic focus in the upper left clivus, and throughout the cervical spine consistent with patient's known osseous metastasis from breast cancer. No acute fracture or dislocation of cervical spine. Electronically Signed   By: Abelardo Diesel M.D.   On: 05/10/2018 14:16   Ct Head W Wo Contrast  Result Date: 04/25/2018 CLINICAL DATA:  Headache.  History of metastatic breast cancer. These results will be called to the ordering clinician or representative by the Radiology Department at the imaging location. EXAM: CT HEAD WITHOUT AND WITH CONTRAST TECHNIQUE: Contiguous axial images were obtained from the base of the skull through the vertex without and with intravenous contrast CONTRAST:  51mL OMNIPAQUE IOHEXOL 300 MG/ML  SOLN COMPARISON:  Brain MRI 02/23/2018 FINDINGS: Brain: No evidence of acute infarction, hemorrhage, hydrocephalus, extra-axial collection or mass lesion/mass effect. Chronic lacune at the right caudate head Vascular: Visible vessels are patent. Lobulated enhancement in the left middle cranial fossa is sphenoparietal  sinus based on prior brain MRI. Skull: Subcentimeter sclerotic focus in the upper left clivus, stable from comparison brain MRI. Patient has known multifocal osseous metastatic disease by bone scan 03/02/2018 Sinuses/Orbits: Negative IMPRESSION: 1. Stable from brain MRI 02/23/2018. 2. No evidence of recurrent intracranial metastasis. 3. Subcentimeter bone metastasis in the clivus. Electronically Signed   By: Monte Fantasia M.D.   On: 04/25/2018 16:45   Ct Cervical Spine Wo Contrast  Result Date: 05/10/2018 CLINICAL DATA:  Status post fall in the bathroom this morning. Patient has a history of stage IV breast cancer. Patient complains pain of the neck and bilateral arms and nausea. EXAM: CT HEAD WITHOUT CONTRAST CT CERVICAL SPINE WITHOUT CONTRAST TECHNIQUE: Multidetector CT imaging of the head and cervical spine was performed following the standard protocol without intravenous contrast. Multiplanar CT image reconstructions of the cervical spine were also generated. COMPARISON:  MRI of the brain February 23, 2018, head CT April 25, 2018 FINDINGS: CT HEAD FINDINGS Brain: No evidence of acute infarction, hemorrhage, hydrocephalus, extra-axial collection or mass lesion/mass effect. There is chronic diffuse atrophy. Vascular: No hyperdense vessel or unexpected calcification. Skull: There is a subcentimeter sclerotic focus in the upper left clivus stable compared to prior exams. Patient has known multifocal osseous metastatic disease. Sinuses/Orbits: No acute finding. Other: None. CT CERVICAL SPINE FINDINGS Alignment: Normal. Patient status post prior anterior fusion of C5-6 without malalignment. Skull base and vertebrae: There are several subcentimeter sclerotic lesions throughout the cervical spine consistent with patient's known osseous metastasis from breast cancer. Soft tissues and spinal canal: No prevertebral fluid or swelling. No visible canal hematoma. Disc levels: There are narrowed intervertebral spaces  throughout cervical spine consistent with degenerative joint changes. Upper chest: Negative. Other: None. IMPRESSION: No focal acute intracranial abnormality identified. Focal sclerotic focus in the upper left clivus, and throughout the cervical spine consistent with patient's known osseous metastasis from breast cancer. No acute fracture or dislocation of cervical spine. Electronically Signed   By: Abelardo Diesel M.D.   On: 05/10/2018 14:16   Mr Brain Wo Contrast  Result Date: 05/13/2018 CLINICAL DATA:  Metastatic breast cancer with worsening confusion. EXAM: MRI HEAD WITHOUT CONTRAST TECHNIQUE: Multiplanar, multiecho pulse sequences of the brain and surrounding structures were obtained without intravenous contrast. COMPARISON:  Head CT 05/10/2018 and MRI 02/23/2018 FINDINGS: The patient terminated the examination before IV contrast was administered. A complete noncontrast brain MRI was obtained. Brain: There is no evidence of acute infarct, intracranial mass effect, or  extra-axial fluid collection. There is mild cerebral atrophy. 3 mm T2 hyperintense focus in the right caudate head is unchanged and corresponds to a treated metastasis. T2 hyperintensities in the cerebral white matter bilaterally are unchanged and nonspecific but may reflect mild chronic small vessel ischemic disease or post treatment changes. Evaluation for brain metastases is limited by lack of IV contrast, however no new area vasogenic edema is present. A partially empty sella and a chronic microhemorrhage in the medial left parietal lobe are unchanged. Vascular: Major intracranial vascular flow voids are preserved. Skull and upper cervical spine: Heterogeneous bone marrow signal in the clivus with a sclerotic metastasis shown on CT. Sinuses/Orbits: Bilateral cataract extraction. Paranasal sinuses and mastoid air cells are clear. Other: None. IMPRESSION: 1. No acute intracranial abnormality. 2. Limited assessment for intracranial metastatic  disease as the patient terminated the examination before IV contrast was administered. No mass lesion or vasogenic edema on this unenhanced study. Electronically Signed   By: Logan Bores M.D.   On: 05/13/2018 17:34   Mr Thoracic Spine Wo Contrast  Result Date: 05/13/2018 CLINICAL DATA:  Severe back pain. EXAM: MRI THORACIC SPINE WITHOUT CONTRAST TECHNIQUE: Multiplanar, multisequence MR imaging of the thoracic spine was performed. No intravenous contrast was administered. COMPARISON:  Earlier same day.  03/11/2018. FINDINGS: The patient could not do any better with imaging on this attempt. Again, we obtained only motion degraded noncontrast imaging. We did achieve some motion degraded axial imaging. Alignment: Normal Vertebrae: Treated metastatic disease as seen previously. Compared to the study of last November, no new lesions are evident. There is no evidence of pathologic fracture. Disease is most extensive within the T7 vertebral body, the T10 vertebral body and the T11 vertebral body. Small lesion in the posterior left corner of T9 as seen previously. Other small scattered lesions as seen previously. There is no evidence of extraosseous disease on this exam. The canal and foramina appear sufficiently patent. No specific cause of acutely worsened pain is identified. Cord:  No cord compression or cord lesion identified. Paraspinal and other soft tissues: Otherwise negative Disc levels: No evidence of disc herniation in the thoracic region. IMPRESSION: This is again in abbreviated motion degraded exam. No contrast was achieved. Axial images are motion degraded. No new lesions are seen when compared to the study of last November. Treated metastatic lesions as outlined above. No evidence of extraosseous tumor. No evidence of pathologic fracture. No evidence of spinal cord or nerve root compression. Electronically Signed   By: Nelson Chimes M.D.   On: 05/13/2018 19:29   Mr Thoracic Spine Wo Contrast  Result  Date: 05/13/2018 CLINICAL DATA:  Back pain.  Metastatic breast cancer. EXAM: MRI THORACIC SPINE WITHOUT CONTRAST TECHNIQUE: Multiplanar, multisequence MR imaging of the thoracic spine was performed. No intravenous contrast was administered. COMPARISON:  03/11/2018 FINDINGS: The patient terminated the examination prematurely. Only sagittal T1 and sagittal T2 sequences were obtained and these are motion degraded (severely so on the T1 sequence. IV contrast was not administered. Alignment: No significant listhesis. Vertebrae: Grossly similar size of multiple bone metastases including a large sclerotic lesion in the T7 vertebral body and right pedicle and extensive lesion involving the T10 vertebral body and posterior elements. Small volume epidural tumor at T10 on the prior study is inadequately evaluated on this study though no gross epidural tumor is evident. Preserved vertebral body heights without evidence of fracture on this limited examination. Partially visualized small metastases in the upper lumbar spine, cervical spine, and  ribs. Cord: Grossly normal cord signal and morphology on the motion degraded sagittal T2 sequence. Paraspinal and other soft tissues: Grossly unremarkable. Disc levels: Similar appearance of very mild thoracic spondylosis. More prominent disc bulging/broad posterior disc protrusion at L1-2 without evidence of compressive spinal stenosis. IMPRESSION: 1. Incomplete, motion degraded noncontrast examination. 2. Similar appearance of multiple bone metastases. No gross epidural tumor. Previously demonstrated small volume epidural tumor at T10 inadequately reassessed on this study. Electronically Signed   By: Logan Bores M.D.   On: 05/13/2018 18:13   Mr Lumbar Spine Wo Contrast  Result Date: 05/13/2018 CLINICAL DATA:  Followup metastatic breast cancer. EXAM: MRI LUMBAR SPINE WITHOUT CONTRAST TECHNIQUE: Multiplanar, multisequence MR imaging of the lumbar spine was performed. No intravenous  contrast was administered. COMPARISON:  CT 09/21/2017. FINDINGS: The patient could not tolerate complete imaging. Sagittal T1 and T2 imaging was performed. Very motion degraded axial imaging is performed. lSegmentation: 5 lumbar type vertebral bodies presumed. Alignment:  Normal Vertebrae: Metastatic lesions scattered throughout the lumbar spine and sacrum. Most of these are small in the lumbar region, less than a cm. Exceptions to this include AA 1.5 cm metastasis within the anterior L4 vertebral body and a metastasis within the sacrum in the S2-S3 region measuring up to 3.4 cm in size. Conus medullaris and cauda equina: Conus extends to the L1 level. Conus and cauda equina appear normal. No evidence of neural compression by tumor. Paraspinal and other soft tissues: Negative as seen. Disc levels: Central disc herniation at L1-2 that indents the thecal sac but does not appear to compress the neural structures. Shallow disc protrusion at L4-5. Facet osteoarthritis at L3-4, L4-5 and L5-S1. No apparent neural compressive stenosis. IMPRESSION: Scattered metastatic disease throughout the lumbar spine and the sacrum. No evidence of pathologic fracture or extraosseous tumor causing neural compression. Largest lesion in the lumbar region is 1.5 cm metastasis within the anterior L4 vertebral body. Sacral disease at S2 and S3 measures up to 3.4 cm in size. Note that this examination is limited by patient discomfort and only sagittal T1 and T2 imaging was achieved. Axial imaging is severely motion degraded. Electronically Signed   By: Nelson Chimes M.D.   On: 05/13/2018 19:15   Dg Chest Port 1 View  Result Date: 05/13/2018 CLINICAL DATA:  Altered mental status.  Metastatic breast carcinoma EXAM: PORTABLE CHEST 1 VIEW COMPARISON:  Chest radiograph Sep 28, 2016; chest CT March 28, 2018 FINDINGS: Port-A-Cath tip is in the superior vena cava. No pneumothorax. No evident edema or consolidation. Heart size and pulmonary  vascular normal. No adenopathy. There is postoperative change in the lower cervical region. There is an expansile lesion in the anterior right sixth rib, suspicious for metastatic focus. Thoracic spine sclerotic lesions seen on prior CT are not well seen on this portable chest radiographic examination. IMPRESSION: Apparent metastatic focus in the anterior right sixth rib. No edema or consolidation. No adenopathy demonstrable by radiography. Electronically Signed   By: Lowella Grip III M.D.   On: 05/13/2018 16:18   Dg Shoulder Left  Result Date: 05/10/2018 CLINICAL DATA:  Left shoulder pain secondary to a fall in the bathroom today. EXAM: LEFT SHOULDER - 2+ VIEW COMPARISON:  MRI dated 02/01/2018 and radiographs dated 02/15/2015 FINDINGS: There is no acute fracture. No dislocation. Old nonunion fracture of the base of the acromion. IMPRESSION: No acute abnormality. Old nonunion fracture of the base of the acromion. Electronically Signed   By: Lorriane Shire M.D.   On:  05/10/2018 13:51    Orson Eva, DO  [facility] Pager [phone]  If 7PM-7AM, please contact night-coverage [email or web address] Password [email or web address] 05/15/2018, 11:14 AM   LOS: 0 days

## 2018-05-16 DIAGNOSIS — Z800 Family history of malignant neoplasm of digestive organs: Secondary | ICD-10-CM | POA: Diagnosis not present

## 2018-05-16 DIAGNOSIS — I427 Cardiomyopathy due to drug and external agent: Secondary | ICD-10-CM | POA: Diagnosis present

## 2018-05-16 DIAGNOSIS — G894 Chronic pain syndrome: Secondary | ICD-10-CM | POA: Diagnosis present

## 2018-05-16 DIAGNOSIS — B370 Candidal stomatitis: Secondary | ICD-10-CM | POA: Diagnosis present

## 2018-05-16 DIAGNOSIS — Z923 Personal history of irradiation: Secondary | ICD-10-CM | POA: Diagnosis not present

## 2018-05-16 DIAGNOSIS — C787 Secondary malignant neoplasm of liver and intrahepatic bile duct: Secondary | ICD-10-CM | POA: Diagnosis present

## 2018-05-16 DIAGNOSIS — E876 Hypokalemia: Secondary | ICD-10-CM | POA: Diagnosis not present

## 2018-05-16 DIAGNOSIS — Z79899 Other long term (current) drug therapy: Secondary | ICD-10-CM | POA: Diagnosis not present

## 2018-05-16 DIAGNOSIS — Z86718 Personal history of other venous thrombosis and embolism: Secondary | ICD-10-CM | POA: Diagnosis not present

## 2018-05-16 DIAGNOSIS — K219 Gastro-esophageal reflux disease without esophagitis: Secondary | ICD-10-CM | POA: Diagnosis present

## 2018-05-16 DIAGNOSIS — G9341 Metabolic encephalopathy: Secondary | ICD-10-CM | POA: Diagnosis present

## 2018-05-16 DIAGNOSIS — E7220 Disorder of urea cycle metabolism, unspecified: Secondary | ICD-10-CM | POA: Diagnosis present

## 2018-05-16 DIAGNOSIS — Z7901 Long term (current) use of anticoagulants: Secondary | ICD-10-CM | POA: Diagnosis not present

## 2018-05-16 DIAGNOSIS — R4182 Altered mental status, unspecified: Secondary | ICD-10-CM | POA: Diagnosis not present

## 2018-05-16 DIAGNOSIS — Z87891 Personal history of nicotine dependence: Secondary | ICD-10-CM | POA: Diagnosis not present

## 2018-05-16 DIAGNOSIS — C7931 Secondary malignant neoplasm of brain: Secondary | ICD-10-CM | POA: Diagnosis present

## 2018-05-16 DIAGNOSIS — Z801 Family history of malignant neoplasm of trachea, bronchus and lung: Secondary | ICD-10-CM | POA: Diagnosis not present

## 2018-05-16 DIAGNOSIS — C7951 Secondary malignant neoplasm of bone: Secondary | ICD-10-CM | POA: Diagnosis present

## 2018-05-16 DIAGNOSIS — Z79891 Long term (current) use of opiate analgesic: Secondary | ICD-10-CM | POA: Diagnosis not present

## 2018-05-16 DIAGNOSIS — N390 Urinary tract infection, site not specified: Secondary | ICD-10-CM | POA: Diagnosis present

## 2018-05-16 DIAGNOSIS — Z9221 Personal history of antineoplastic chemotherapy: Secondary | ICD-10-CM | POA: Diagnosis not present

## 2018-05-16 DIAGNOSIS — G9340 Encephalopathy, unspecified: Secondary | ICD-10-CM

## 2018-05-16 DIAGNOSIS — Z807 Family history of other malignant neoplasms of lymphoid, hematopoietic and related tissues: Secondary | ICD-10-CM | POA: Diagnosis not present

## 2018-05-16 DIAGNOSIS — C50919 Malignant neoplasm of unspecified site of unspecified female breast: Secondary | ICD-10-CM | POA: Diagnosis not present

## 2018-05-16 DIAGNOSIS — Z808 Family history of malignant neoplasm of other organs or systems: Secondary | ICD-10-CM | POA: Diagnosis not present

## 2018-05-16 DIAGNOSIS — Z853 Personal history of malignant neoplasm of breast: Secondary | ICD-10-CM | POA: Diagnosis not present

## 2018-05-16 DIAGNOSIS — K7290 Hepatic failure, unspecified without coma: Secondary | ICD-10-CM | POA: Diagnosis present

## 2018-05-16 DIAGNOSIS — Z833 Family history of diabetes mellitus: Secondary | ICD-10-CM | POA: Diagnosis not present

## 2018-05-16 LAB — COMPREHENSIVE METABOLIC PANEL
ALT: 24 U/L (ref 0–44)
AST: 67 U/L — ABNORMAL HIGH (ref 15–41)
Albumin: 3.3 g/dL — ABNORMAL LOW (ref 3.5–5.0)
Alkaline Phosphatase: 123 U/L (ref 38–126)
Anion gap: 6 (ref 5–15)
BILIRUBIN TOTAL: 0.6 mg/dL (ref 0.3–1.2)
BUN: <5 mg/dL — ABNORMAL LOW (ref 6–20)
CO2: 24 mmol/L (ref 22–32)
Calcium: 8.7 mg/dL — ABNORMAL LOW (ref 8.9–10.3)
Chloride: 107 mmol/L (ref 98–111)
Creatinine, Ser: 0.78 mg/dL (ref 0.44–1.00)
GFR calc Af Amer: >60 mL/min (ref >60)
GFR calc non Af Amer: >60 mL/min (ref >60)
Glucose, Bld: 117 mg/dL — ABNORMAL HIGH (ref 70–99)
Potassium: 3.2 mmol/L — ABNORMAL LOW (ref 3.5–5.1)
Sodium: 137 mmol/L (ref 135–145)
TOTAL PROTEIN: 6.7 g/dL (ref 6.5–8.1)

## 2018-05-16 LAB — MAGNESIUM: Magnesium: 2.5 mg/dL — ABNORMAL HIGH (ref 1.7–2.4)

## 2018-05-16 LAB — AMMONIA: Ammonia: 34 umol/L (ref 9–35)

## 2018-05-16 LAB — HEPATITIS C ANTIBODY: HCV Ab: <0.1 s/co ratio (ref 0.0–0.9)

## 2018-05-16 LAB — HEPATITIS B SURFACE ANTIGEN: Hepatitis B Surface Ag: NEGATIVE

## 2018-05-16 MED ORDER — LACTULOSE 10 GM/15ML PO SOLN: 20.0000 g | Freq: Two times a day (BID) | ORAL | 1 refills | Status: DC

## 2018-05-16 MED ORDER — CEFDINIR 300 MG PO CAPS: 300.0000 mg | ORAL_CAPSULE | Freq: Two times a day (BID) | ORAL | 0 refills | Status: DC

## 2018-05-16 NOTE — Care Management Note (Signed)
Case Management Note  Patient Details  Name: [name] MRN: [id number] Date of Birth: [birth year]  Subjective/Objective:       Hepatic encephalopathy.              Action/Plan: Discharging home with [facility] PT. Offered choice from [employer]. [place]. Vaughan Basta of [facility] notified and will obtain orders via Epic.  Offered home care resources for future needs.   Expected Discharge Date:  05/16/18               Expected Discharge Plan:  [place]  In-House Referral:     Discharge planning Services  CM Consult  Post Acute Care Choice:  Home Health Choice offered to:  Patient, Spouse  DME Arranged:    DME Agency:     HH Arranged:  PT [place]:  [place]  Status of Service:  Completed, signed off  If discussed at [place] of Stay Meetings, dates discussed:    Additional Comments:  [name], Allison Reading, RN 05/16/2018, 1:52 PM

## 2018-05-16 NOTE — Progress Notes (Signed)
Removed IV-clean, dry, intact. Reviewed d/c paperwork with patient, husband, and sister. Answered all questions. NT wheeled stable patient and belongings to main entrance. D/cd to home.

## 2018-05-16 NOTE — Discharge Summary (Signed)
Physician Discharge Summary  [name] PQA:[id number] DOB: 21-Oct-1965 DOA: 05/13/2018  PCP: [name], MD  Admit date: 05/13/2018 Discharge date: 05/16/2018  Admitted From: Home Disposition:  Home  Recommendations for Outpatient Follow-up:  1. Follow up with PCP in 1-2 weeks 2. Please obtain BMP/CBC in one week   Home Health: yes Equipment/Devices: HHPT  Discharge Condition: Stable CODE STATUS: FULL Diet recommendation:Regular    Brief/Interim Summary: 53 year old female with a history of metastatic breast cancer to the liver, brain and vertebra, DVT on rivaroxaban, anxiety presenting with increasing confusion and generalized weakness. According to patient's family, the patient has had increasing confusion, hallucinations, and urinary incontinence. Her husband states that the patient has had some mild confusion since finishing radiation around Thanksgiving time. However in the past week, the patient's confusion has worsened with increasing somnolence. However the patient is arousable and able to answer questions although she is confused. In addition, the patient has had increasing lower extremity weaknessresulting in gait instability and mechanical falls. Apparently the patient had a mechanical fall on 05/10/2018. She presented emergency department where CT of the brain and cervical spine were unremarkable. Her husband states that the patient is still able to ambulate. In fact, the patient has become restless where she has been getting up off the couch impulsively without asking for assistance. The patient has had decreasing oral intake and hallucinations. She went to see her medical oncologist on 05/13/2017. She was sent to the emergency department where MRI of the brain, thoracic, and lumbar spines were performed. MRI brain was negative for any acute findings. MRI of thoracic spine did not show any new lesions compared to previous study in November 2019. In  particular, there is no extraosseous tumor or spinal cord compression. MRI of the lumbar spine showed scattered metastatic disease without pathologic fracture or extraosseous tumor. Laboratory blood work showed potassium 3.3. Otherwise BMP was unremarkable. Ammonia level was noted to be 68. Lactic acid was 1.0. CBC was essentially unremarkable with hemoglobin of 8.2 and platelets 152,000.  Discharge Diagnoses:  Acute metabolic encephalopathy -Multifactorial including hepatic encephalopathy, dehydration, medication induced(patient on significant opioids and hypnotics)and possible UTI -TSH--1.813 -Serum R10--211 -Folic ZNBV--67.0 -If there is no improvement with correction of her metabolic abnormalities, she may need neurology consult for evaluation of paraneoplastic syndrome -her cognitive and functional decline has impaired her ability to care for herself, and she may require SNF for a short time -I offered transfer of patient to [facility] or [facility]--pt's husband wants to hold off on that for now -05/16/2018--mental status continues to improve, but husband states that it is still not at baseline.  However, he feels that going home to be back in her usual home environment may continue to facilitate improvement -RPR pending at the time of discharge -Patient's husband requested outpatient referral to his neurologist to evaluate for possible paraneoplastic syndrome  Hyperammonemia/hepaticencephalopathy -Increase lactulose 20 g 3 times daily during hospitalization -If there is no improvement with correction of her metabolic abnormalities, she may need neurology consult for evaluation of paraneoplastic syndrome -Previous CTs have shown a nodular liver suggesting she may have had some underlying liver cirrhosis -09/21/2017 CT abdomen showed improvement of her hepatic metastasis, but persistent 2.3 cm right hepatic lobe mets -Ammonia 68>>> 34 -Continue lactulose 20 g twice daily after  discharge -Has been given instructions to increase lactulose to 3 times daily if the patient has less than 2 bowel movements daily  Hypokalemia -Repleted -Check magnesium--2.5  Pyuria -Continue empiric  ceftriaxone pending urine culture data -Urine culture unrevealing -Given the patient's clinical presentation, benefit outweighs the risk for empiric treatment -Discharge home with 4 more days of cefdinir to complete 1 week of therapy  History of DVT -Continue rivaroxaban  Metastatic breast cancer -Metastasis to brain, liver, and vertebrae -Patient follows withDr. Delton Coombes -Kadcyla started on 03/04/2018, cycle 4 on 05/06/2018. -continue home MS Contin and oxycodone  Goals of Care -FULL CODE Advance care planning, including the explanation and discussion of advance directives was carried out with the patient and family. Code status including explanations of "Full Code" and "DNR" and alternatives were discussed in detail. Discussion of end-of-life issues including but not limited palliative care, hospice care and the concept of hospice, other end-of-life care options, power of attorney for health care decisions, living wills, and physician orders for life-sustaining treatment were also discussed with the patient and family. Total face to face time 16 minutes.   Discharge Instructions  Discharge Instructions    Ambulatory referral to Neurology   Complete by:  As directed    An appointment is requested in approximately: 2-3 weeks Evaluate for possible paraneoplastic syndrome     Allergies as of 05/16/2018   No Known Allergies     Medication List    STOP taking these medications   ciprofloxacin 500 MG tablet Commonly known as:  CIPRO     TAKE these medications   acetaminophen 325 MG tablet Commonly known as:  TYLENOL Take 650 mg by mouth every 6 (six) hours as needed for moderate pain or headache.   calcium carbonate 500 MG chewable tablet Commonly known as:   TUMS - dosed in mg elemental calcium Chew 2 tablets by mouth daily.   CALCIUM/D3 ADULT GUMMIES PO Take 2 tablets by mouth 2 (two) times daily. dosage of calcium 1232m and vitamin d 10068m  cefdinir 300 MG capsule Commonly known as:  OMNICEF Take 1 capsule (300 mg total) by mouth 2 (two) times daily.   desvenlafaxine 100 MG 24 hr tablet Commonly known as:  PRISTIQ Take 1 tablet (100 mg total) by mouth daily.   dicyclomine 20 MG tablet Commonly known as:  BENTYL Take 1 tablet (20 mg total) by mouth 4 (four) times daily -  before meals and at bedtime.   diphenhydrAMINE 25 mg capsule Commonly known as:  BENADRYL Take 25 mg by mouth at bedtime as needed for itching.   diphenoxylate-atropine 2.5-0.025 MG tablet Commonly known as:  LOMOTIL Take 2 tablets by mouth 4 (four) times daily as needed for diarrhea or loose stools.   DUREZOL 0.05 % Emul Generic drug:  Difluprednate INSTILL ONE DROP IN THE RIGHT EYE TWICE DAILY TIME ONE WEEK THEN ONCE DAILY   furosemide 20 MG tablet Commonly known as:  LASIX TAKE 1 TABLET BY MOUTH DAILY   gabapentin 300 MG capsule Commonly known as:  NEURONTIN Take 2 capsules (600 mg total) by mouth at bedtime.   lactulose 10 GM/15ML solution Commonly known as:  CHRONULAC Take 30 mLs (20 g total) by mouth 2 (two) times daily.   lidocaine-prilocaine cream Commonly known as:  EMLA Apply 1 application topically daily as needed (apply to port before chemo).   LORazepam 0.5 MG tablet Commonly known as:  ATIVAN TAKE 1 TABLET BY MOUTH EVERY 6 HOURS AS NEEDED FOR ANXIETY   Melatonin 10 MG Caps Take 0.5 tablets by mouth at bedtime. Pt states she takes one tablet at night for sleep   morphine 30 MG 12 hr  tablet Commonly known as:  MS CONTIN TAKE 1 TABLET BY MOUTH EVERY TWELVE HOURS   NARCAN 4 MG/0.1ML Liqd nasal spray kit Generic drug:  naloxone CALL 911. ADMINISTER A SINGLE SPRAY OF NARCAN IN ONE NOSTRIL, REPEAT EVERY 3 MINUTES AS NEEDED IF NO OR  MINIMAL RESPONSE   ondansetron 8 MG tablet Commonly known as:  ZOFRAN TAKE ONE TABLET BY MOUTH EVERY 8 HOURS AS NEEDED FOR NAUSEA AND VOMITING   oxyCODONE 5 MG immediate release tablet Commonly known as:  Oxy IR/ROXICODONE Take 1-2 tablets (5-10 mg total) by mouth every 4 (four) hours as needed for moderate pain.   pantoprazole 40 MG tablet Commonly known as:  PROTONIX Take 1 tablet (40 mg total) by mouth every 12 (twelve) hours.   potassium chloride SA 20 MEQ tablet Commonly known as:  K-DUR,KLOR-CON Take 2 tablets (40 mEq total) by mouth daily.   promethazine 25 MG tablet Commonly known as:  PHENERGAN TAKE 1 TABLET BY MOUTH EVERY SIX HOURS AS NEEDED FOR NAUSEA OR VOMITING   rivaroxaban 20 MG Tabs tablet Commonly known as:  XARELTO TAKE 1 TABLET BY MOUTH EVERY DAY WITH SUPPER   spironolactone 25 MG tablet Commonly known as:  ALDACTONE Take 1 tablet (25 mg total) by mouth daily.   zolpidem 10 MG tablet Commonly known as:  AMBIEN TAKE 1 TABLET BY MOUTH AT BEDTIME AS NEEDED FOR SLEEP   ZOMETA IV Inject into the vein every 3 (three) months.       No Known Allergies  Consultations:  none   Procedures/Studies: Dg Elbow Complete Right  Result Date: 05/10/2018 CLINICAL DATA:  Pain and bruising to the right elbow secondary to a fall in her bathroom today. EXAM: RIGHT ELBOW - COMPLETE 3+ VIEW COMPARISON:  None. FINDINGS: There is no evidence of fracture, dislocation, or joint effusion. Tiny osteophyte on the coronoid process of the proximal ulna. Soft tissues are unremarkable. IMPRESSION: No acute abnormalities. Electronically Signed   By: Lorriane Shire M.D.   On: 05/10/2018 13:53   Ct Head Wo Contrast  Result Date: 05/10/2018 CLINICAL DATA:  Status post fall in the bathroom this morning. Patient has a history of stage IV breast cancer. Patient complains pain of the neck and bilateral arms and nausea. EXAM: CT HEAD WITHOUT CONTRAST CT CERVICAL SPINE WITHOUT CONTRAST  TECHNIQUE: Multidetector CT imaging of the head and cervical spine was performed following the standard protocol without intravenous contrast. Multiplanar CT image reconstructions of the cervical spine were also generated. COMPARISON:  MRI of the brain February 23, 2018, head CT April 25, 2018 FINDINGS: CT HEAD FINDINGS Brain: No evidence of acute infarction, hemorrhage, hydrocephalus, extra-axial collection or mass lesion/mass effect. There is chronic diffuse atrophy. Vascular: No hyperdense vessel or unexpected calcification. Skull: There is a subcentimeter sclerotic focus in the upper left clivus stable compared to prior exams. Patient has known multifocal osseous metastatic disease. Sinuses/Orbits: No acute finding. Other: None. CT CERVICAL SPINE FINDINGS Alignment: Normal. Patient status post prior anterior fusion of C5-6 without malalignment. Skull base and vertebrae: There are several subcentimeter sclerotic lesions throughout the cervical spine consistent with patient's known osseous metastasis from breast cancer. Soft tissues and spinal canal: No prevertebral fluid or swelling. No visible canal hematoma. Disc levels: There are narrowed intervertebral spaces throughout cervical spine consistent with degenerative joint changes. Upper chest: Negative. Other: None. IMPRESSION: No focal acute intracranial abnormality identified. Focal sclerotic focus in the upper left clivus, and throughout the cervical spine consistent with patient's known  osseous metastasis from breast cancer. No acute fracture or dislocation of cervical spine. Electronically Signed   By: Abelardo Diesel M.D.   On: 05/10/2018 14:16   Ct Head W Wo Contrast  Result Date: 04/25/2018 CLINICAL DATA:  Headache.  History of metastatic breast cancer. These results will be called to the ordering clinician or representative by the Radiology Department at the imaging location. EXAM: CT HEAD WITHOUT AND WITH CONTRAST TECHNIQUE: Contiguous axial  images were obtained from the base of the skull through the vertex without and with intravenous contrast CONTRAST:  38m OMNIPAQUE IOHEXOL 300 MG/ML  SOLN COMPARISON:  Brain MRI 02/23/2018 FINDINGS: Brain: No evidence of acute infarction, hemorrhage, hydrocephalus, extra-axial collection or mass lesion/mass effect. Chronic lacune at the right caudate head Vascular: Visible vessels are patent. Lobulated enhancement in the left middle cranial fossa is sphenoparietal sinus based on prior brain MRI. Skull: Subcentimeter sclerotic focus in the upper left clivus, stable from comparison brain MRI. Patient has known multifocal osseous metastatic disease by bone scan 03/02/2018 Sinuses/Orbits: Negative IMPRESSION: 1. Stable from brain MRI 02/23/2018. 2. No evidence of recurrent intracranial metastasis. 3. Subcentimeter bone metastasis in the clivus. Electronically Signed   By: JMonte FantasiaM.D.   On: 04/25/2018 16:45   Ct Cervical Spine Wo Contrast  Result Date: 05/10/2018 CLINICAL DATA:  Status post fall in the bathroom this morning. Patient has a history of stage IV breast cancer. Patient complains pain of the neck and bilateral arms and nausea. EXAM: CT HEAD WITHOUT CONTRAST CT CERVICAL SPINE WITHOUT CONTRAST TECHNIQUE: Multidetector CT imaging of the head and cervical spine was performed following the standard protocol without intravenous contrast. Multiplanar CT image reconstructions of the cervical spine were also generated. COMPARISON:  MRI of the brain February 23, 2018, head CT April 25, 2018 FINDINGS: CT HEAD FINDINGS Brain: No evidence of acute infarction, hemorrhage, hydrocephalus, extra-axial collection or mass lesion/mass effect. There is chronic diffuse atrophy. Vascular: No hyperdense vessel or unexpected calcification. Skull: There is a subcentimeter sclerotic focus in the upper left clivus stable compared to prior exams. Patient has known multifocal osseous metastatic disease. Sinuses/Orbits: No  acute finding. Other: None. CT CERVICAL SPINE FINDINGS Alignment: Normal. Patient status post prior anterior fusion of C5-6 without malalignment. Skull base and vertebrae: There are several subcentimeter sclerotic lesions throughout the cervical spine consistent with patient's known osseous metastasis from breast cancer. Soft tissues and spinal canal: No prevertebral fluid or swelling. No visible canal hematoma. Disc levels: There are narrowed intervertebral spaces throughout cervical spine consistent with degenerative joint changes. Upper chest: Negative. Other: None. IMPRESSION: No focal acute intracranial abnormality identified. Focal sclerotic focus in the upper left clivus, and throughout the cervical spine consistent with patient's known osseous metastasis from breast cancer. No acute fracture or dislocation of cervical spine. Electronically Signed   By: WAbelardo DieselM.D.   On: 05/10/2018 14:16   Mr Brain Wo Contrast  Result Date: 05/13/2018 CLINICAL DATA:  Metastatic breast cancer with worsening confusion. EXAM: MRI HEAD WITHOUT CONTRAST TECHNIQUE: Multiplanar, multiecho pulse sequences of the brain and surrounding structures were obtained without intravenous contrast. COMPARISON:  Head CT 05/10/2018 and MRI 02/23/2018 FINDINGS: The patient terminated the examination before IV contrast was administered. A complete noncontrast brain MRI was obtained. Brain: There is no evidence of acute infarct, intracranial mass effect, or extra-axial fluid collection. There is mild cerebral atrophy. 3 mm T2 hyperintense focus in the right caudate head is unchanged and corresponds to a treated metastasis. T2  hyperintensities in the cerebral white matter bilaterally are unchanged and nonspecific but may reflect mild chronic small vessel ischemic disease or post treatment changes. Evaluation for brain metastases is limited by lack of IV contrast, however no new area vasogenic edema is present. A partially empty sella and a  chronic microhemorrhage in the medial left parietal lobe are unchanged. Vascular: Major intracranial vascular flow voids are preserved. Skull and upper cervical spine: Heterogeneous bone marrow signal in the clivus with a sclerotic metastasis shown on CT. Sinuses/Orbits: Bilateral cataract extraction. Paranasal sinuses and mastoid air cells are clear. Other: None. IMPRESSION: 1. No acute intracranial abnormality. 2. Limited assessment for intracranial metastatic disease as the patient terminated the examination before IV contrast was administered. No mass lesion or vasogenic edema on this unenhanced study. Electronically Signed   By: Logan Bores M.D.   On: 05/13/2018 17:34   Mr Thoracic Spine Wo Contrast  Result Date: 05/13/2018 CLINICAL DATA:  Severe back pain. EXAM: MRI THORACIC SPINE WITHOUT CONTRAST TECHNIQUE: Multiplanar, multisequence MR imaging of the thoracic spine was performed. No intravenous contrast was administered. COMPARISON:  Earlier same day.  03/11/2018. FINDINGS: The patient could not do any better with imaging on this attempt. Again, we obtained only motion degraded noncontrast imaging. We did achieve some motion degraded axial imaging. Alignment: Normal Vertebrae: Treated metastatic disease as seen previously. Compared to the study of last November, no new lesions are evident. There is no evidence of pathologic fracture. Disease is most extensive within the T7 vertebral body, the T10 vertebral body and the T11 vertebral body. Small lesion in the posterior left corner of T9 as seen previously. Other small scattered lesions as seen previously. There is no evidence of extraosseous disease on this exam. The canal and foramina appear sufficiently patent. No specific cause of acutely worsened pain is identified. Cord:  No cord compression or cord lesion identified. Paraspinal and other soft tissues: Otherwise negative Disc levels: No evidence of disc herniation in the thoracic region. IMPRESSION:  This is again in abbreviated motion degraded exam. No contrast was achieved. Axial images are motion degraded. No new lesions are seen when compared to the study of last November. Treated metastatic lesions as outlined above. No evidence of extraosseous tumor. No evidence of pathologic fracture. No evidence of spinal cord or nerve root compression. Electronically Signed   By: Nelson Chimes M.D.   On: 05/13/2018 19:29   Mr Thoracic Spine Wo Contrast  Result Date: 05/13/2018 CLINICAL DATA:  Back pain.  Metastatic breast cancer. EXAM: MRI THORACIC SPINE WITHOUT CONTRAST TECHNIQUE: Multiplanar, multisequence MR imaging of the thoracic spine was performed. No intravenous contrast was administered. COMPARISON:  03/11/2018 FINDINGS: The patient terminated the examination prematurely. Only sagittal T1 and sagittal T2 sequences were obtained and these are motion degraded (severely so on the T1 sequence. IV contrast was not administered. Alignment: No significant listhesis. Vertebrae: Grossly similar size of multiple bone metastases including a large sclerotic lesion in the T7 vertebral body and right pedicle and extensive lesion involving the T10 vertebral body and posterior elements. Small volume epidural tumor at T10 on the prior study is inadequately evaluated on this study though no gross epidural tumor is evident. Preserved vertebral body heights without evidence of fracture on this limited examination. Partially visualized small metastases in the upper lumbar spine, cervical spine, and ribs. Cord: Grossly normal cord signal and morphology on the motion degraded sagittal T2 sequence. Paraspinal and other soft tissues: Grossly unremarkable. Disc levels: Similar appearance of  very mild thoracic spondylosis. More prominent disc bulging/broad posterior disc protrusion at L1-2 without evidence of compressive spinal stenosis. IMPRESSION: 1. Incomplete, motion degraded noncontrast examination. 2. Similar appearance of  multiple bone metastases. No gross epidural tumor. Previously demonstrated small volume epidural tumor at T10 inadequately reassessed on this study. Electronically Signed   By: Logan Bores M.D.   On: 05/13/2018 18:13   Mr Lumbar Spine Wo Contrast  Result Date: 05/13/2018 CLINICAL DATA:  Followup metastatic breast cancer. EXAM: MRI LUMBAR SPINE WITHOUT CONTRAST TECHNIQUE: Multiplanar, multisequence MR imaging of the lumbar spine was performed. No intravenous contrast was administered. COMPARISON:  CT 09/21/2017. FINDINGS: The patient could not tolerate complete imaging. Sagittal T1 and T2 imaging was performed. Very motion degraded axial imaging is performed. lSegmentation: 5 lumbar type vertebral bodies presumed. Alignment:  Normal Vertebrae: Metastatic lesions scattered throughout the lumbar spine and sacrum. Most of these are small in the lumbar region, less than a cm. Exceptions to this include AA 1.5 cm metastasis within the anterior L4 vertebral body and a metastasis within the sacrum in the S2-S3 region measuring up to 3.4 cm in size. Conus medullaris and cauda equina: Conus extends to the L1 level. Conus and cauda equina appear normal. No evidence of neural compression by tumor. Paraspinal and other soft tissues: Negative as seen. Disc levels: Central disc herniation at L1-2 that indents the thecal sac but does not appear to compress the neural structures. Shallow disc protrusion at L4-5. Facet osteoarthritis at L3-4, L4-5 and L5-S1. No apparent neural compressive stenosis. IMPRESSION: Scattered metastatic disease throughout the lumbar spine and the sacrum. No evidence of pathologic fracture or extraosseous tumor causing neural compression. Largest lesion in the lumbar region is 1.5 cm metastasis within the anterior L4 vertebral body. Sacral disease at S2 and S3 measures up to 3.4 cm in size. Note that this examination is limited by patient discomfort and only sagittal T1 and T2 imaging was achieved.  Axial imaging is severely motion degraded. Electronically Signed   By: Nelson Chimes M.D.   On: 05/13/2018 19:15   Dg Chest Port 1 View  Result Date: 05/13/2018 CLINICAL DATA:  Altered mental status.  Metastatic breast carcinoma EXAM: PORTABLE CHEST 1 VIEW COMPARISON:  Chest radiograph Sep 28, 2016; chest CT March 28, 2018 FINDINGS: Port-A-Cath tip is in the superior vena cava. No pneumothorax. No evident edema or consolidation. Heart size and pulmonary vascular normal. No adenopathy. There is postoperative change in the lower cervical region. There is an expansile lesion in the anterior right sixth rib, suspicious for metastatic focus. Thoracic spine sclerotic lesions seen on prior CT are not well seen on this portable chest radiographic examination. IMPRESSION: Apparent metastatic focus in the anterior right sixth rib. No edema or consolidation. No adenopathy demonstrable by radiography. Electronically Signed   By: Lowella Grip III M.D.   On: 05/13/2018 16:18   Dg Shoulder Left  Result Date: 05/10/2018 CLINICAL DATA:  Left shoulder pain secondary to a fall in the bathroom today. EXAM: LEFT SHOULDER - 2+ VIEW COMPARISON:  MRI dated 02/01/2018 and radiographs dated 02/15/2015 FINDINGS: There is no acute fracture. No dislocation. Old nonunion fracture of the base of the acromion. IMPRESSION: No acute abnormality. Old nonunion fracture of the base of the acromion. Electronically Signed   By: Lorriane Shire M.D.   On: 05/10/2018 13:51        Discharge Exam: Vitals:   05/15/18 0608 05/15/18 2020  BP: (!) 117/49 (!) 97/58  Pulse: 78  78  Resp: 18 16  Temp: 98.9 F (37.2 C) 99.1 F (37.3 C)  SpO2: 98% 97%   Vitals:   05/14/18 1451 05/14/18 2059 05/15/18 0608 05/15/18 2020  BP: 112/61 (!) 106/45 (!) 117/49 (!) 97/58  Pulse: 88 79 78 78  Resp: '20 18 18 16  ' Temp: 98.7 F (37.1 C) 99 F (37.2 C) 98.9 F (37.2 C) 99.1 F (37.3 C)  TempSrc:  Oral Oral   SpO2: 98% 98% 98% 97%  Weight:       Height:        General: Pt is alert, awake, not in acute distress Cardiovascular: RRR, S1/S2 +, no rubs, no gallops Respiratory: CTA bilaterally, no wheezing, no rhonchi Abdominal: Soft, NT, ND, bowel sounds + Extremities: no edema, no cyanosis   The results of significant diagnostics from this hospitalization (including imaging, microbiology, ancillary and laboratory) are listed below for reference.    Significant Diagnostic Studies: Dg Elbow Complete Right  Result Date: 05/10/2018 CLINICAL DATA:  Pain and bruising to the right elbow secondary to a fall in her bathroom today. EXAM: RIGHT ELBOW - COMPLETE 3+ VIEW COMPARISON:  None. FINDINGS: There is no evidence of fracture, dislocation, or joint effusion. Tiny osteophyte on the coronoid process of the proximal ulna. Soft tissues are unremarkable. IMPRESSION: No acute abnormalities. Electronically Signed   By: Lorriane Shire M.D.   On: 05/10/2018 13:53   Ct Head Wo Contrast  Result Date: 05/10/2018 CLINICAL DATA:  Status post fall in the bathroom this morning. Patient has a history of stage IV breast cancer. Patient complains pain of the neck and bilateral arms and nausea. EXAM: CT HEAD WITHOUT CONTRAST CT CERVICAL SPINE WITHOUT CONTRAST TECHNIQUE: Multidetector CT imaging of the head and cervical spine was performed following the standard protocol without intravenous contrast. Multiplanar CT image reconstructions of the cervical spine were also generated. COMPARISON:  MRI of the brain February 23, 2018, head CT April 25, 2018 FINDINGS: CT HEAD FINDINGS Brain: No evidence of acute infarction, hemorrhage, hydrocephalus, extra-axial collection or mass lesion/mass effect. There is chronic diffuse atrophy. Vascular: No hyperdense vessel or unexpected calcification. Skull: There is a subcentimeter sclerotic focus in the upper left clivus stable compared to prior exams. Patient has known multifocal osseous metastatic disease. Sinuses/Orbits: No  acute finding. Other: None. CT CERVICAL SPINE FINDINGS Alignment: Normal. Patient status post prior anterior fusion of C5-6 without malalignment. Skull base and vertebrae: There are several subcentimeter sclerotic lesions throughout the cervical spine consistent with patient's known osseous metastasis from breast cancer. Soft tissues and spinal canal: No prevertebral fluid or swelling. No visible canal hematoma. Disc levels: There are narrowed intervertebral spaces throughout cervical spine consistent with degenerative joint changes. Upper chest: Negative. Other: None. IMPRESSION: No focal acute intracranial abnormality identified. Focal sclerotic focus in the upper left clivus, and throughout the cervical spine consistent with patient's known osseous metastasis from breast cancer. No acute fracture or dislocation of cervical spine. Electronically Signed   By: Abelardo Diesel M.D.   On: 05/10/2018 14:16   Ct Head W Wo Contrast  Result Date: 04/25/2018 CLINICAL DATA:  Headache.  History of metastatic breast cancer. These results will be called to the ordering clinician or representative by the Radiology Department at the imaging location. EXAM: CT HEAD WITHOUT AND WITH CONTRAST TECHNIQUE: Contiguous axial images were obtained from the base of the skull through the vertex without and with intravenous contrast CONTRAST:  70m OMNIPAQUE IOHEXOL 300 MG/ML  SOLN COMPARISON:  Brain  MRI 02/23/2018 FINDINGS: Brain: No evidence of acute infarction, hemorrhage, hydrocephalus, extra-axial collection or mass lesion/mass effect. Chronic lacune at the right caudate head Vascular: Visible vessels are patent. Lobulated enhancement in the left middle cranial fossa is sphenoparietal sinus based on prior brain MRI. Skull: Subcentimeter sclerotic focus in the upper left clivus, stable from comparison brain MRI. Patient has known multifocal osseous metastatic disease by bone scan 03/02/2018 Sinuses/Orbits: Negative IMPRESSION: 1.  Stable from brain MRI 02/23/2018. 2. No evidence of recurrent intracranial metastasis. 3. Subcentimeter bone metastasis in the clivus. Electronically Signed   By: Monte Fantasia M.D.   On: 04/25/2018 16:45   Ct Cervical Spine Wo Contrast  Result Date: 05/10/2018 CLINICAL DATA:  Status post fall in the bathroom this morning. Patient has a history of stage IV breast cancer. Patient complains pain of the neck and bilateral arms and nausea. EXAM: CT HEAD WITHOUT CONTRAST CT CERVICAL SPINE WITHOUT CONTRAST TECHNIQUE: Multidetector CT imaging of the head and cervical spine was performed following the standard protocol without intravenous contrast. Multiplanar CT image reconstructions of the cervical spine were also generated. COMPARISON:  MRI of the brain February 23, 2018, head CT April 25, 2018 FINDINGS: CT HEAD FINDINGS Brain: No evidence of acute infarction, hemorrhage, hydrocephalus, extra-axial collection or mass lesion/mass effect. There is chronic diffuse atrophy. Vascular: No hyperdense vessel or unexpected calcification. Skull: There is a subcentimeter sclerotic focus in the upper left clivus stable compared to prior exams. Patient has known multifocal osseous metastatic disease. Sinuses/Orbits: No acute finding. Other: None. CT CERVICAL SPINE FINDINGS Alignment: Normal. Patient status post prior anterior fusion of C5-6 without malalignment. Skull base and vertebrae: There are several subcentimeter sclerotic lesions throughout the cervical spine consistent with patient's known osseous metastasis from breast cancer. Soft tissues and spinal canal: No prevertebral fluid or swelling. No visible canal hematoma. Disc levels: There are narrowed intervertebral spaces throughout cervical spine consistent with degenerative joint changes. Upper chest: Negative. Other: None. IMPRESSION: No focal acute intracranial abnormality identified. Focal sclerotic focus in the upper left clivus, and throughout the cervical  spine consistent with patient's known osseous metastasis from breast cancer. No acute fracture or dislocation of cervical spine. Electronically Signed   By: Abelardo Diesel M.D.   On: 05/10/2018 14:16   Mr Brain Wo Contrast  Result Date: 05/13/2018 CLINICAL DATA:  Metastatic breast cancer with worsening confusion. EXAM: MRI HEAD WITHOUT CONTRAST TECHNIQUE: Multiplanar, multiecho pulse sequences of the brain and surrounding structures were obtained without intravenous contrast. COMPARISON:  Head CT 05/10/2018 and MRI 02/23/2018 FINDINGS: The patient terminated the examination before IV contrast was administered. A complete noncontrast brain MRI was obtained. Brain: There is no evidence of acute infarct, intracranial mass effect, or extra-axial fluid collection. There is mild cerebral atrophy. 3 mm T2 hyperintense focus in the right caudate head is unchanged and corresponds to a treated metastasis. T2 hyperintensities in the cerebral white matter bilaterally are unchanged and nonspecific but may reflect mild chronic small vessel ischemic disease or post treatment changes. Evaluation for brain metastases is limited by lack of IV contrast, however no new area vasogenic edema is present. A partially empty sella and a chronic microhemorrhage in the medial left parietal lobe are unchanged. Vascular: Major intracranial vascular flow voids are preserved. Skull and upper cervical spine: Heterogeneous bone marrow signal in the clivus with a sclerotic metastasis shown on CT. Sinuses/Orbits: Bilateral cataract extraction. Paranasal sinuses and mastoid air cells are clear. Other: None. IMPRESSION: 1. No acute intracranial abnormality.  2. Limited assessment for intracranial metastatic disease as the patient terminated the examination before IV contrast was administered. No mass lesion or vasogenic edema on this unenhanced study. Electronically Signed   By: Logan Bores M.D.   On: 05/13/2018 17:34   Mr Thoracic Spine Wo  Contrast  Result Date: 05/13/2018 CLINICAL DATA:  Severe back pain. EXAM: MRI THORACIC SPINE WITHOUT CONTRAST TECHNIQUE: Multiplanar, multisequence MR imaging of the thoracic spine was performed. No intravenous contrast was administered. COMPARISON:  Earlier same day.  03/11/2018. FINDINGS: The patient could not do any better with imaging on this attempt. Again, we obtained only motion degraded noncontrast imaging. We did achieve some motion degraded axial imaging. Alignment: Normal Vertebrae: Treated metastatic disease as seen previously. Compared to the study of last November, no new lesions are evident. There is no evidence of pathologic fracture. Disease is most extensive within the T7 vertebral body, the T10 vertebral body and the T11 vertebral body. Small lesion in the posterior left corner of T9 as seen previously. Other small scattered lesions as seen previously. There is no evidence of extraosseous disease on this exam. The canal and foramina appear sufficiently patent. No specific cause of acutely worsened pain is identified. Cord:  No cord compression or cord lesion identified. Paraspinal and other soft tissues: Otherwise negative Disc levels: No evidence of disc herniation in the thoracic region. IMPRESSION: This is again in abbreviated motion degraded exam. No contrast was achieved. Axial images are motion degraded. No new lesions are seen when compared to the study of last November. Treated metastatic lesions as outlined above. No evidence of extraosseous tumor. No evidence of pathologic fracture. No evidence of spinal cord or nerve root compression. Electronically Signed   By: Nelson Chimes M.D.   On: 05/13/2018 19:29   Mr Thoracic Spine Wo Contrast  Result Date: 05/13/2018 CLINICAL DATA:  Back pain.  Metastatic breast cancer. EXAM: MRI THORACIC SPINE WITHOUT CONTRAST TECHNIQUE: Multiplanar, multisequence MR imaging of the thoracic spine was performed. No intravenous contrast was administered.  COMPARISON:  03/11/2018 FINDINGS: The patient terminated the examination prematurely. Only sagittal T1 and sagittal T2 sequences were obtained and these are motion degraded (severely so on the T1 sequence. IV contrast was not administered. Alignment: No significant listhesis. Vertebrae: Grossly similar size of multiple bone metastases including a large sclerotic lesion in the T7 vertebral body and right pedicle and extensive lesion involving the T10 vertebral body and posterior elements. Small volume epidural tumor at T10 on the prior study is inadequately evaluated on this study though no gross epidural tumor is evident. Preserved vertebral body heights without evidence of fracture on this limited examination. Partially visualized small metastases in the upper lumbar spine, cervical spine, and ribs. Cord: Grossly normal cord signal and morphology on the motion degraded sagittal T2 sequence. Paraspinal and other soft tissues: Grossly unremarkable. Disc levels: Similar appearance of very mild thoracic spondylosis. More prominent disc bulging/broad posterior disc protrusion at L1-2 without evidence of compressive spinal stenosis. IMPRESSION: 1. Incomplete, motion degraded noncontrast examination. 2. Similar appearance of multiple bone metastases. No gross epidural tumor. Previously demonstrated small volume epidural tumor at T10 inadequately reassessed on this study. Electronically Signed   By: Logan Bores M.D.   On: 05/13/2018 18:13   Mr Lumbar Spine Wo Contrast  Result Date: 05/13/2018 CLINICAL DATA:  Followup metastatic breast cancer. EXAM: MRI LUMBAR SPINE WITHOUT CONTRAST TECHNIQUE: Multiplanar, multisequence MR imaging of the lumbar spine was performed. No intravenous contrast was administered. COMPARISON:  CT 09/21/2017. FINDINGS: The patient could not tolerate complete imaging. Sagittal T1 and T2 imaging was performed. Very motion degraded axial imaging is performed. lSegmentation: 5 lumbar type vertebral  bodies presumed. Alignment:  Normal Vertebrae: Metastatic lesions scattered throughout the lumbar spine and sacrum. Most of these are small in the lumbar region, less than a cm. Exceptions to this include AA 1.5 cm metastasis within the anterior L4 vertebral body and a metastasis within the sacrum in the S2-S3 region measuring up to 3.4 cm in size. Conus medullaris and cauda equina: Conus extends to the L1 level. Conus and cauda equina appear normal. No evidence of neural compression by tumor. Paraspinal and other soft tissues: Negative as seen. Disc levels: Central disc herniation at L1-2 that indents the thecal sac but does not appear to compress the neural structures. Shallow disc protrusion at L4-5. Facet osteoarthritis at L3-4, L4-5 and L5-S1. No apparent neural compressive stenosis. IMPRESSION: Scattered metastatic disease throughout the lumbar spine and the sacrum. No evidence of pathologic fracture or extraosseous tumor causing neural compression. Largest lesion in the lumbar region is 1.5 cm metastasis within the anterior L4 vertebral body. Sacral disease at S2 and S3 measures up to 3.4 cm in size. Note that this examination is limited by patient discomfort and only sagittal T1 and T2 imaging was achieved. Axial imaging is severely motion degraded. Electronically Signed   By: Nelson Chimes M.D.   On: 05/13/2018 19:15   Dg Chest Port 1 View  Result Date: 05/13/2018 CLINICAL DATA:  Altered mental status.  Metastatic breast carcinoma EXAM: PORTABLE CHEST 1 VIEW COMPARISON:  Chest radiograph Sep 28, 2016; chest CT March 28, 2018 FINDINGS: Port-A-Cath tip is in the superior vena cava. No pneumothorax. No evident edema or consolidation. Heart size and pulmonary vascular normal. No adenopathy. There is postoperative change in the lower cervical region. There is an expansile lesion in the anterior right sixth rib, suspicious for metastatic focus. Thoracic spine sclerotic lesions seen on prior CT are not well  seen on this portable chest radiographic examination. IMPRESSION: Apparent metastatic focus in the anterior right sixth rib. No edema or consolidation. No adenopathy demonstrable by radiography. Electronically Signed   By: Lowella Grip III M.D.   On: 05/13/2018 16:18   Dg Shoulder Left  Result Date: 05/10/2018 CLINICAL DATA:  Left shoulder pain secondary to a fall in the bathroom today. EXAM: LEFT SHOULDER - 2+ VIEW COMPARISON:  MRI dated 02/01/2018 and radiographs dated 02/15/2015 FINDINGS: There is no acute fracture. No dislocation. Old nonunion fracture of the base of the acromion. IMPRESSION: No acute abnormality. Old nonunion fracture of the base of the acromion. Electronically Signed   By: Lorriane Shire M.D.   On: 05/10/2018 13:51     Microbiology: Recent Results (from the past 240 hour(s))  Urine Culture     Status: Abnormal   Collection Time: 05/13/18  3:32 PM  Result Value Ref Range Status   Specimen Description   Final    URINE, CATHETERIZED Performed at [facility], [address] [place], [place] [address]    Special Requests   Final    NONE Performed at [facility], [address] [place], [place] [address]    Culture MULTIPLE SPECIES PRESENT, SUGGEST RECOLLECTION (A)  Final   Report Status 05/15/2018 FINAL  Final     Labs: Basic Metabolic Panel: Recent Labs  Lab 05/10/18 1240 05/13/18 1242 05/13/18 1556 05/14/18 0709 05/15/18 0752 05/16/18 0657  NA 139 138  --  140 137 137  K 3.4* 3.3*  --  2.9* 2.7* 3.2*  CL 102 100  --  107 108 107  CO2 27 29  --  '25 23 24  ' GLUCOSE 122* 135*  --  137* 120* 117*  BUN 6 6  --  6 5* <5*  CREATININE 0.74 0.85  --  0.70 0.80 0.78  CALCIUM 8.7* 8.9  --  8.6* 8.4* 8.7*  MG  --   --  2.1  --   --  2.5*  PHOS  --   --  3.4  --   --   --    Liver Function Tests: Recent Labs  Lab 05/10/18 1240 05/13/18 1242 05/14/18 0709 05/15/18 0752 05/16/18 0657  AST 57* 63* 59* 64* 67*  ALT '25 25 21 22 24  ' ALKPHOS 133* 141*  132* 128* 123  BILITOT 0.7 0.8 0.6 0.6 0.6  PROT 7.0 7.1 6.6 6.6 6.7  ALBUMIN 3.4* 3.4* 3.1* 3.2* 3.3*   No results for input(s): LIPASE, AMYLASE in the last 168 hours. Recent Labs  Lab 05/13/18 1241 05/14/18 0917 05/15/18 0752 05/16/18 0657  AMMONIA 68* 45* 45* 34   CBC: Recent Labs  Lab 05/10/18 1240 05/13/18 1242 05/14/18 0709 05/15/18 0752  WBC 5.3 4.4 4.6 5.4  NEUTROABS 3.9 3.2  --   --   HGB 8.4* 8.2* 7.5* 7.7*  HCT 29.3* 28.4* 26.6* 27.1*  MCV 82.3 83.3 83.4 83.1  PLT 138* 152 130* 141*   Cardiac Enzymes: Recent Labs  Lab 05/13/18 1556  TROPONINI <0.03   BNP: Invalid input(s): POCBNP CBG: No results for input(s): GLUCAP in the last 168 hours.  Time coordinating discharge:  36 minutes  Signed:  Orson Eva, DO [facility] Pager: [phone] 05/16/2018, 11:30 AM

## 2018-05-17 DIAGNOSIS — Z9181 History of falling: Secondary | ICD-10-CM | POA: Diagnosis not present

## 2018-05-17 DIAGNOSIS — Z7901 Long term (current) use of anticoagulants: Secondary | ICD-10-CM | POA: Diagnosis not present

## 2018-05-17 DIAGNOSIS — C50919 Malignant neoplasm of unspecified site of unspecified female breast: Secondary | ICD-10-CM | POA: Diagnosis not present

## 2018-05-17 DIAGNOSIS — C7931 Secondary malignant neoplasm of brain: Secondary | ICD-10-CM | POA: Diagnosis not present

## 2018-05-17 DIAGNOSIS — N390 Urinary tract infection, site not specified: Secondary | ICD-10-CM | POA: Diagnosis not present

## 2018-05-17 DIAGNOSIS — G9341 Metabolic encephalopathy: Secondary | ICD-10-CM | POA: Diagnosis not present

## 2018-05-17 DIAGNOSIS — R5381 Other malaise: Secondary | ICD-10-CM | POA: Diagnosis not present

## 2018-05-17 DIAGNOSIS — C7951 Secondary malignant neoplasm of bone: Secondary | ICD-10-CM | POA: Diagnosis not present

## 2018-05-17 DIAGNOSIS — K7290 Hepatic failure, unspecified without coma: Secondary | ICD-10-CM | POA: Diagnosis not present

## 2018-05-17 DIAGNOSIS — C787 Secondary malignant neoplasm of liver and intrahepatic bile duct: Secondary | ICD-10-CM | POA: Diagnosis not present

## 2018-05-17 DIAGNOSIS — R2689 Other abnormalities of gait and mobility: Secondary | ICD-10-CM | POA: Diagnosis not present

## 2018-05-17 LAB — RPR: RPR Ser Ql: NONREACTIVE

## 2018-05-18 DIAGNOSIS — C7951 Secondary malignant neoplasm of bone: Secondary | ICD-10-CM | POA: Diagnosis not present

## 2018-05-18 DIAGNOSIS — C50919 Malignant neoplasm of unspecified site of unspecified female breast: Secondary | ICD-10-CM | POA: Diagnosis not present

## 2018-05-18 DIAGNOSIS — Z510 Encounter for antineoplastic radiation therapy: Secondary | ICD-10-CM | POA: Diagnosis not present

## 2018-05-18 DIAGNOSIS — C7931 Secondary malignant neoplasm of brain: Secondary | ICD-10-CM | POA: Diagnosis not present

## 2018-05-18 DIAGNOSIS — C50911 Malignant neoplasm of unspecified site of right female breast: Secondary | ICD-10-CM | POA: Diagnosis not present

## 2018-05-19 ENCOUNTER — Ambulatory Visit (HOSPITAL_COMMUNITY)
Admission: RE | Admit: 2018-05-19 | Discharge: 2018-05-19 | Disposition: A | Discharge status: Home / Self Care | Payer: Medicare Other | Source: Ambulatory Visit | Attending: Internal Medicine | Admitting: Internal Medicine

## 2018-05-19 DIAGNOSIS — Z79899 Other long term (current) drug therapy: Secondary | ICD-10-CM | POA: Diagnosis not present

## 2018-05-19 DIAGNOSIS — C50919 Malignant neoplasm of unspecified site of unspecified female breast: Secondary | ICD-10-CM

## 2018-05-19 NOTE — Progress Notes (Signed)
*  PRELIMINARY RESULTS* Echocardiogram 2D Echocardiogram has been performed.  Samuel Germany 05/19/2018, 12:39 PM

## 2018-05-20 DIAGNOSIS — G9341 Metabolic encephalopathy: Secondary | ICD-10-CM | POA: Diagnosis not present

## 2018-05-20 DIAGNOSIS — K7290 Hepatic failure, unspecified without coma: Secondary | ICD-10-CM | POA: Diagnosis not present

## 2018-05-20 DIAGNOSIS — C50919 Malignant neoplasm of unspecified site of unspecified female breast: Secondary | ICD-10-CM | POA: Diagnosis not present

## 2018-05-20 DIAGNOSIS — R5381 Other malaise: Secondary | ICD-10-CM | POA: Diagnosis not present

## 2018-05-20 DIAGNOSIS — N390 Urinary tract infection, site not specified: Secondary | ICD-10-CM | POA: Diagnosis not present

## 2018-05-20 DIAGNOSIS — R2689 Other abnormalities of gait and mobility: Secondary | ICD-10-CM | POA: Diagnosis not present

## 2018-05-23 ENCOUNTER — Other Ambulatory Visit (HOSPITAL_COMMUNITY): Payer: Self-pay | Admitting: Internal Medicine

## 2018-05-23 ENCOUNTER — Other Ambulatory Visit (HOSPITAL_COMMUNITY): Payer: Self-pay | Admitting: Nurse Practitioner

## 2018-05-23 DIAGNOSIS — C50919 Malignant neoplasm of unspecified site of unspecified female breast: Secondary | ICD-10-CM

## 2018-05-23 DIAGNOSIS — N390 Urinary tract infection, site not specified: Secondary | ICD-10-CM | POA: Diagnosis not present

## 2018-05-23 DIAGNOSIS — R5381 Other malaise: Secondary | ICD-10-CM | POA: Diagnosis not present

## 2018-05-23 DIAGNOSIS — G4700 Insomnia, unspecified: Secondary | ICD-10-CM

## 2018-05-23 DIAGNOSIS — K7290 Hepatic failure, unspecified without coma: Secondary | ICD-10-CM | POA: Diagnosis not present

## 2018-05-23 DIAGNOSIS — R2689 Other abnormalities of gait and mobility: Secondary | ICD-10-CM | POA: Diagnosis not present

## 2018-05-23 DIAGNOSIS — G9341 Metabolic encephalopathy: Secondary | ICD-10-CM | POA: Diagnosis not present

## 2018-05-26 DIAGNOSIS — R5381 Other malaise: Secondary | ICD-10-CM | POA: Diagnosis not present

## 2018-05-26 DIAGNOSIS — G9341 Metabolic encephalopathy: Secondary | ICD-10-CM | POA: Diagnosis not present

## 2018-05-26 DIAGNOSIS — K7290 Hepatic failure, unspecified without coma: Secondary | ICD-10-CM | POA: Diagnosis not present

## 2018-05-26 DIAGNOSIS — R2689 Other abnormalities of gait and mobility: Secondary | ICD-10-CM | POA: Diagnosis not present

## 2018-05-26 DIAGNOSIS — N390 Urinary tract infection, site not specified: Secondary | ICD-10-CM | POA: Diagnosis not present

## 2018-05-26 DIAGNOSIS — C50919 Malignant neoplasm of unspecified site of unspecified female breast: Secondary | ICD-10-CM | POA: Diagnosis not present

## 2018-05-27 ENCOUNTER — Ambulatory Visit (HOSPITAL_COMMUNITY): Payer: Self-pay | Admitting: Hematology

## 2018-05-27 ENCOUNTER — Inpatient Hospital Stay (HOSPITAL_COMMUNITY): Payer: Medicare Other

## 2018-05-27 ENCOUNTER — Inpatient Hospital Stay (HOSPITAL_BASED_OUTPATIENT_CLINIC_OR_DEPARTMENT_OTHER): Payer: Medicare Other | Admitting: Hematology

## 2018-05-27 ENCOUNTER — Other Ambulatory Visit: Payer: Self-pay

## 2018-05-27 ENCOUNTER — Encounter (HOSPITAL_COMMUNITY): Payer: Self-pay | Admitting: Hematology

## 2018-05-27 VITALS — BP 118/42 | HR 80 | Temp 98.4°F | Resp 16 | Wt 270.3 lb

## 2018-05-27 DIAGNOSIS — C7931 Secondary malignant neoplasm of brain: Secondary | ICD-10-CM | POA: Diagnosis not present

## 2018-05-27 DIAGNOSIS — C7951 Secondary malignant neoplasm of bone: Secondary | ICD-10-CM | POA: Diagnosis not present

## 2018-05-27 DIAGNOSIS — Z9221 Personal history of antineoplastic chemotherapy: Secondary | ICD-10-CM

## 2018-05-27 DIAGNOSIS — Z8041 Family history of malignant neoplasm of ovary: Secondary | ICD-10-CM

## 2018-05-27 DIAGNOSIS — Z923 Personal history of irradiation: Secondary | ICD-10-CM

## 2018-05-27 DIAGNOSIS — Z79811 Long term (current) use of aromatase inhibitors: Secondary | ICD-10-CM | POA: Diagnosis not present

## 2018-05-27 DIAGNOSIS — Z86010 Personal history of colonic polyps: Secondary | ICD-10-CM | POA: Diagnosis not present

## 2018-05-27 DIAGNOSIS — C50912 Malignant neoplasm of unspecified site of left female breast: Secondary | ICD-10-CM | POA: Diagnosis not present

## 2018-05-27 DIAGNOSIS — Z5112 Encounter for antineoplastic immunotherapy: Secondary | ICD-10-CM | POA: Diagnosis not present

## 2018-05-27 DIAGNOSIS — Z86718 Personal history of other venous thrombosis and embolism: Secondary | ICD-10-CM

## 2018-05-27 DIAGNOSIS — C50919 Malignant neoplasm of unspecified site of unspecified female breast: Secondary | ICD-10-CM

## 2018-05-27 DIAGNOSIS — Z806 Family history of leukemia: Secondary | ICD-10-CM | POA: Diagnosis not present

## 2018-05-27 DIAGNOSIS — Z809 Family history of malignant neoplasm, unspecified: Secondary | ICD-10-CM

## 2018-05-27 DIAGNOSIS — Z87891 Personal history of nicotine dependence: Secondary | ICD-10-CM | POA: Diagnosis not present

## 2018-05-27 DIAGNOSIS — C787 Secondary malignant neoplasm of liver and intrahepatic bile duct: Secondary | ICD-10-CM | POA: Diagnosis not present

## 2018-05-27 DIAGNOSIS — K219 Gastro-esophageal reflux disease without esophagitis: Secondary | ICD-10-CM | POA: Diagnosis not present

## 2018-05-27 DIAGNOSIS — Z79899 Other long term (current) drug therapy: Secondary | ICD-10-CM | POA: Diagnosis not present

## 2018-05-27 DIAGNOSIS — Z170 Estrogen receptor positive status [ER+]: Secondary | ICD-10-CM | POA: Diagnosis not present

## 2018-05-27 DIAGNOSIS — Z801 Family history of malignant neoplasm of trachea, bronchus and lung: Secondary | ICD-10-CM

## 2018-05-27 DIAGNOSIS — Z7901 Long term (current) use of anticoagulants: Secondary | ICD-10-CM | POA: Diagnosis not present

## 2018-05-27 DIAGNOSIS — Z8042 Family history of malignant neoplasm of prostate: Secondary | ICD-10-CM | POA: Diagnosis not present

## 2018-05-27 LAB — COMPREHENSIVE METABOLIC PANEL
ALT: 20 U/L (ref 0–44)
AST: 40 U/L (ref 15–41)
Albumin: 3.2 g/dL — ABNORMAL LOW (ref 3.5–5.0)
Alkaline Phosphatase: 125 U/L (ref 38–126)
Anion gap: 11 (ref 5–15)
BUN: 6 mg/dL (ref 6–20)
CALCIUM: 8.7 mg/dL — AB (ref 8.9–10.3)
CO2: 27 mmol/L (ref 22–32)
CREATININE: 0.85 mg/dL (ref 0.44–1.00)
Chloride: 96 mmol/L — ABNORMAL LOW (ref 98–111)
GFR calc Af Amer: >60 mL/min (ref >60)
GFR calc non Af Amer: >60 mL/min (ref >60)
Glucose, Bld: 149 mg/dL — ABNORMAL HIGH (ref 70–99)
Potassium: 3.3 mmol/L — ABNORMAL LOW (ref 3.5–5.1)
Sodium: 134 mmol/L — ABNORMAL LOW (ref 135–145)
Total Bilirubin: 0.6 mg/dL (ref 0.3–1.2)
Total Protein: 6.7 g/dL (ref 6.5–8.1)

## 2018-05-27 LAB — CBC WITH DIFFERENTIAL/PLATELET
Abs Immature Granulocytes: 0.03 10*3/uL (ref 0.00–0.07)
Basophils Absolute: 0 10*3/uL (ref 0.0–0.1)
Basophils Relative: 0 %
Eosinophils Absolute: 0.1 10*3/uL (ref 0.0–0.5)
Eosinophils Relative: 1 %
HCT: 27 % — ABNORMAL LOW (ref 36.0–46.0)
Hemoglobin: 7.5 g/dL — ABNORMAL LOW (ref 12.0–15.0)
Immature Granulocytes: 1 %
LYMPHS PCT: 12 %
Lymphs Abs: 0.7 10*3/uL (ref 0.7–4.0)
MCH: 22.9 pg — ABNORMAL LOW (ref 26.0–34.0)
MCHC: 27.8 g/dL — ABNORMAL LOW (ref 30.0–36.0)
MCV: 82.6 fL (ref 80.0–100.0)
Monocytes Absolute: 0.7 10*3/uL (ref 0.1–1.0)
Monocytes Relative: 11 %
Neutro Abs: 4.5 10*3/uL (ref 1.7–7.7)
Neutrophils Relative %: 75 %
Platelets: 190 10*3/uL (ref 150–400)
RBC: 3.27 MIL/uL — ABNORMAL LOW (ref 3.87–5.11)
RDW: 20.6 % — ABNORMAL HIGH (ref 11.5–15.5)
WBC: 6 10*3/uL (ref 4.0–10.5)
nRBC: 0.3 % — ABNORMAL HIGH (ref 0.0–0.2)

## 2018-05-27 LAB — LACTATE DEHYDROGENASE: LDH: 241 U/L — ABNORMAL HIGH (ref 98–192)

## 2018-05-27 LAB — AMMONIA: Ammonia: 36 umol/L — ABNORMAL HIGH (ref 9–35)

## 2018-05-27 LAB — ABO/RH: ABO/RH(D): A NEG

## 2018-05-27 LAB — MAGNESIUM: Magnesium: 2.2 mg/dL (ref 1.7–2.4)

## 2018-05-27 LAB — PREPARE RBC (CROSSMATCH)

## 2018-05-27 MED ORDER — LACTULOSE 10 GM/15ML PO SOLN: 20.0000 g | Freq: Two times a day (BID) | ORAL | 3 refills | Status: DC

## 2018-05-27 NOTE — Progress Notes (Signed)
[place], [place] [address]   CLINIC:  Medical Oncology/Hematology  PCP:  [name], MD [place] [address] [phone]   REASON FOR VISIT: Follow-up for Stage IV adenocarcinoma of breast, ER+, HER2 + with metastatic disease to brain and bone  PREVIOUS THERAPY: Herceptin + Tykerb beginning on 07/23/2017   CURRENT THERAPY: Hospice  BRIEF ONCOLOGIC HISTORY:    Breast cancer, stage 4 ([place])   04/25/2014 Initial Diagnosis    Breast cancer, stage 4    04/25/2014 Imaging    CT abdomen/pelvis with widespread metastatic disease to the liver, multiple lytic lesions throughout spine and pelvis. No FX or epidural tumor identified    04/26/2014 Imaging    CT head unremarkable    04/26/2014 Imaging    CT chest with no lung mass or pulmonary nodules, no adenopathy. Lytic bone lesions, right 2nd rib    04/27/2014 Initial Biopsy    U/S guided liver biopsy, lesion in anterior and inferior left hepatic lobe biopsied    04/27/2014 Pathology Results    Metastatic adenocarcinoma, CK7, ER+, patchy positivity with PR. Possible primary includes breast, less likely gynecologic    05/15/2014 Mammogram    BI-RADS CATEGORY  2: Benign Finding(s)    05/16/2014 PET scan    1. Intensely hypermetabolic hepatic metastasis. 2. Widespread hypermetabolic skeletal lesions. 3. No primary adenocarcinoma identified by FDG PET imaging.    05/19/2014 Imaging    MUGA- Left ventricular ejection fracture greater than 70%.    05/21/2014 Breast MRI    No suspicious masses or enhancement within the breasts. No axillary adenopathy.    05/22/2014 - 07/03/2014 Antibody Plan    Herceptin/Perjeta/Tamoxifen    06/12/2014 - 07/03/2014 Chemotherapy    Taxotere added secondary to persistent abdominal and back pain    06/17/2014 - 06/19/2014 Hospital Admission    Neutropenia, fever, diarrhea, nausea, vomiting    06/20/2014 - 07/10/2014 Radiation Therapy    Dr. Thea Silversmith 12 fractions to L3-S3 (30 Gy) and left scapula (20 Gy).     07/03/2014 Adverse Reaction    Perjeta- induced diarrhea.  Perjeta discontinued    07/16/2014 - 07/20/2014 Hospital Admission    Electrolyte abnormalities, and diarrhea.  Suspect Perjeta-induced diarrhea.  Negative GI work-up.    07/24/2014 - 08/19/2015 Chemotherapy    Herceptin/Tamoxifen/Xgeva    08/21/2014 Imaging    MUGA- Left ventricular ejection fraction equals 71%.    08/24/2014 PET scan    Dramatic reduction in metabolic activity of the widespread liver metastasis. Liver metastasis now have metabolic activity equal to background normal liver activity. Liver has a nodular contour. Marked reduction in metabolic activity of skeletal lesions..    10/05/2014 Progression    Widespread metastatic disease to the brain as described. Between 20 and 30 intracranial metastatic deposits are now seen. No midline shift or incipient herniation    10/09/2014 - 10/26/2014 Radiation Therapy    Whole Brain XRT    11/14/2014 Imaging    MUGA- LVEF 67%    02/13/2015 Imaging    MUGA- LVEF 59%    02/15/2015 Treatment Plan Change    Due to declining LVEF, will hold Herceptin per PI guidelines.    04/12/2015 -  Chemotherapy    Herceptin restarted    06/02/2015 - 06/08/2015 Hospital Admission    Pneumonia    07/05/2015 Progression     PET/CT concern for mild progression of skeletal metastasis with several lesions within the spine  and 1 lesion in the Left iliac wing with mild to moderate metabolic activity new from prior. Rising CA 27-29    07/16/2015 - 10/23/2015 Anti-estrogen oral therapy    Arimidex    07/16/2015 Imaging    MRI brain with satisfactory post treatment apperance of brain. interval resolved enhancing R caudate metastasis, minimal punctate residual enhancing metastatic disease at the inferior L cerebellum. No new metastatic disease or new intracranial abnormality    07/19/2015 Treatment Plan Change    Discontinue Tamoxifen, Zoladex  plus Arimidex.     08/27/2015 Procedure    Laparoscopic bilateral salpingo-oophorectomy by Dr. Gaetano Net    10/17/2015 PET scan    Osseous metastatic disease appears slightly progressive based on a new right scapular lesion and increased uptake within lesions in the thoracic spine, left iliac wing and  proximal right femur.    10/17/2015 Progression    Slight progression on PET scan imaging.    10/18/2015 Imaging    REsolved enhancing metastatic disease to the brain status post WBXRT    10/23/2015 - 02/12/2016 Adjuvant Chemotherapy    Faslodex loading followed by maintenance dose.  (Herceptin continued)    11/04/2015 Imaging    MUGA- LEFT ventricular ejection fraction 51% slightly decreased in a 57% on the previous exam.    12/24/2015 Treatment Plan Change    Zometa every 28 days.  Xgeva discontinued.      01/01/2016 Imaging    MUGA- Left ventricular ejection fraction equals 57.9%. This is increased from 51.1% previously.    02/03/2016 PET scan    1. Mixed metabolic changes in the scattered hypermetabolic sclerotic osseous metastases throughout the axial and proximal appendicular skeleton as detailed above. 2. No new sites of hypermetabolic metastatic disease. Stable pseudo-cirrhotic appearance of the liver due to treated liver metastases with no hypermetabolic liver metastases.     02/03/2016 Progression    PET shows mixed osseous response.  Some lesions more hypermetabolic, others improved.    02/05/2016 Treatment Plan Change    D/C Herceptin.  Faslodex as scheduled on 02/12/2016, then discontinued.  Continue Zometa.    02/05/2016 Treatment Plan Change    Prescriptions for Xeloda 7 days on and 7 days off and Tykerb printed and provided for authorization.    02/19/2016 -  Chemotherapy    Xeloda 2300 mg BID 7 days on and 7 days off and Tykerb     02/24/2016 Treatment Plan Change    Xeloda dose reduced by 10% to 2000 mg BID week on and week off.    03/18/2016 Treatment Plan Change     Xeloda dose reduced to 2000 mg in AM and 1500 mg in PM 7 days on and 7 days off.    03/23/2016 Imaging    MUGA- Normal LEFT ventricular ejection fraction of 56% not significantly changed from 58% on previous exam.    05/19/2016 Imaging    MRI brain- . No new focus of abnormal enhancement to suggest interval metastatic disease. 2. No acute intracranial abnormality. 3. Stable foci of T2 FLAIR hyperintensity in white matter and in the right caudate head compatible with posttreatment changes and treated metastasis.    05/20/2016 PET scan    1. The multiple osseous metastatic lesions shown to be hypermetabolic on the prior exam are reduced in activity or even resolved in hypermetabolic activity compared to prior, as detailed above. There are also numerous sclerotic bony lesions wedge are not currently and were not previously hypermetabolic, compatible with old non active lesions. 2.  No findings of extra osseous metastatic disease currently. 3. Hepatic pseudocirrhosis. 4. Healing rib fractures. Chronic pathologic fracture the left acromion.    07/08/2016 Imaging    MUGA- Low normal LEFT ventricular ejection fraction of 53%, little changed since the 56% on 03/23/2016 but slightly decreased from the 58% on 01/01/2016.    09/07/2016 Echocardiogram    MUGA- Normal LEFT ventricular ejection fraction of 59% with normal LV wall motion.    09/07/2016 Treatment Plan Change    Xeloda dose reduced to 1500 mg BID 7 days on and 7 days off.    09/15/2016 PET scan    No significant change in diffuse sclerotic bone metastases on CT images, although several show mildly increased FDG uptake since prior study.  No evidence of soft tissue metastatic disease.    11/02/2016 Imaging    MRI brain- No change from the prior study. Previously noted metastatic deposits have been treated. No new lesions identified.    07/23/2017 -  Chemotherapy    Herceptin + Tykerb     Metastatic breast cancer ([place])    08/26/2015 Initial Diagnosis    Metastatic breast cancer ([place])    03/04/2018 -  Chemotherapy    The patient had ado-trastuzumab emtansine (KADCYLA) 420 mg in sodium chloride 0.9 % 250 mL chemo infusion, 3.4 mg/kg = 440 mg, Intravenous, Once, 4 of 5 cycles Administration: 420 mg (03/04/2018), 420 mg (03/25/2018), 420 mg (04/15/2018), 420 mg (05/06/2018)  for chemotherapy treatment.       CANCER STAGING: Cancer Staging Breast cancer, stage 4 ([place]) Staging form: Breast, AJCC 7th Edition - Clinical stage from 05/27/2014: Stage IV (T0, N0, M1) - Signed by Baird Cancer, PA-C on 05/27/2014    INTERVAL HISTORY:  Ms. Lotspeich 53 y.o. female returns for routine follow-up for Stage IV adenocarcinoma of breast with metastatic disease to brain and bone. She is here today wheelchair bound with her husband. She is still confused and having hallucinations. She is not wanting to continue treatment at this time. Denies any nausea, vomiting, or diarrhea. Denies any new pains. Had not noticed any recent bleeding such as epistaxis, hematuria or hematochezia. Denies recent chest pain on exertion, shortness of breath on minimal exertion, pre-syncopal episodes, or palpitations. Denies any numbness or tingling in hands or feet. Denies any recent fevers, infections, or recent hospitalizations. Patient reports appetite at 0% and energy level at 0%.   REVIEW OF SYSTEMS:  Review of Systems  Constitutional: Positive for fatigue.  Respiratory: Positive for shortness of breath.   Cardiovascular: Positive for leg swelling.  Neurological: Positive for dizziness and extremity weakness.  All other systems reviewed and are negative.    PAST MEDICAL/SURGICAL HISTORY:  Past Medical History:  Diagnosis Date  . Anticoagulated    xarelto  . Anxiety   . Breast cancer metastasized to multiple sites [facility])    liver, brain, and bone  . Breast cancer, stage 4 [facility]) oncologist-  dr Larene [place] ([facility])   dx  12/ 2015 -- breast cancer Stage 4,  ER/HER2 +,  w/  liver, brain and  bone mets/  chemotherapy and radiation therapy  . Chronic pain syndrome    secondary to cancer   . Depression   . Drug-induced cardiomyopathy ([place])    per last MUGA (08-08-2015), ef 56.5/ per last echo 05-18-2014 ef 60-65%  . Family history of prostate cancer   . GERD (gastroesophageal reflux disease)   . History of colon polyps    07-13-2013  benign  . History of DVT (deep vein thrombosis)    07-09-2014  upper right extremity-  RIJ and right subclavian--  resolved  . History of gastritis    erosive  . History of pneumonia    HCAP 06-07-2015--  resolved per cxr 07-04-2015  . History of radiation therapy    12 fractions to L3 - S3, 30Gy and left spacula 20Gy (06-20-2014 to 07-10-2014) //  whole brain rxt (10-09-2014 to 10-26-2014)  . History of small bowel obstruction    S/P RESECTION 2008  . Migraine   . PONV (postoperative nausea and vomiting)    pt states scope patch does well   Past Surgical History:  Procedure Laterality Date  . BREAST REDUCTION SURGERY  03/17/2011   Procedure: MAMMARY REDUCTION BILATERAL (BREAST);  Surgeon: Mary A Contogiannis;  Location: [facility];  Service: Plastics;  Laterality: Bilateral;  . CATARACT EXTRACTION W/ INTRAOCULAR LENS  IMPLANT, BILATERAL  2008  . CERVICAL FUSION  2003   C5 -- C6  . COLONOSCOPY N/A 07/13/2013   Procedure: COLONOSCOPY;  Surgeon: Rogene Houston, MD;  [facility];  Service: Endoscopy;  Laterality: N/A;  930  . COLONOSCOPY N/A 11/26/2014   Procedure: COLONOSCOPY;  Surgeon: Rogene Houston, MD;  [facility];  Service: Endoscopy;  Laterality: N/A;  730  . D & C HYSTEROSCOPY/  RESECTION ENDOMETRIAL MASS/  [place] ENDOMETRIAL ABLATION  04-11-2010  . DX LAPAROSCOPY W/ PARTIAL SMALL BOWEL RESECTION AND APPENDECTOMY  04-13-2007  . ESOPHAGOGASTRODUODENOSCOPY N/A 05/25/2014   Procedure: ESOPHAGOGASTRODUODENOSCOPY (EGD);  Surgeon:  Rogene Houston, MD;  [facility];  Service: Endoscopy;  Laterality: N/A;  155  . KNEE ARTHROSCOPY Right 2005  . LAPAROSCOPIC ASSISTED VAGINAL HYSTERECTOMY  10-13-2010   w/ Bx Left Fallopian tube and Aspiration Right Ovarian Cyst  . LAPAROSCOPIC CHOLECYSTECTOMY  11-17-2002  . LAPAROSCOPIC SALPINGO OOPHERECTOMY Bilateral 08/26/2015   Procedure: LAPAROSCOPIC SALPINGO OOPHORECTOMY, bilateral;  Surgeon: Everlene Farrier, MD;  Location: [facility];  Service: Gynecology;  Laterality: Bilateral;  . PORTACATH PLACEMENT  05-17-2014  . REDUCTION MAMMAPLASTY    . SHOULDER ARTHROSCOPY WITH ROTATOR CUFF REPAIR Right 2002  . TRANSTHORACIC ECHOCARDIOGRAM  05-18-2014   ef 60-65%//   last MUGA  (08-08-2015)  ef 56.6%       SOCIAL HISTORY:  Social History   Socioeconomic History  . Marital status: Married    Spouse name: Not on file  . Number of children: Not on file  . Years of education: Not on file  . Highest education level: Not on file  Occupational History  . Not on file  Social Needs  . Financial resource strain: Not on file  . Food insecurity:    Worry: Not on file    Inability: Not on file  . Transportation needs:    Medical: Not on file    Non-medical: Not on file  Tobacco Use  . Smoking status: Former Smoker    Types: Cigarettes    Last attempt to quit: 08/20/1994    Years since quitting: 23.7  . Smokeless tobacco: Never Used  Substance and Sexual Activity  . Alcohol use: Yes    Comment: Occasionally  . Drug use: No  . Sexual activity: Yes    Partners: Male    Birth control/protection: Surgical, Condom  Lifestyle  . Physical activity:    Days per week: Not on file    Minutes per session: Not on file  . Stress:  Not on file  Relationships  . Social connections:    Talks on phone: Not on file    Gets together: Not on file    Attends religious service: Not on file    Active member of club or organization: Not on file    Attends meetings of clubs  or organizations: Not on file    Relationship status: Not on file  . Intimate partner violence:    Fear of current or ex partner: Not on file    Emotionally abused: Not on file    Physically abused: Not on file    Forced sexual activity: Not on file  Other Topics Concern  . Not on file  Social History Narrative  . Not on file    FAMILY HISTORY:  Family History  Problem Relation Age of Onset  . Diabetes Father   . Heart attack Maternal Grandmother 30       multiple over lifetime.  . Cancer Maternal Grandmother 15       NOS  . Prostate cancer Maternal Grandfather        dx in his 74s  . Lung cancer Paternal Grandfather        dx <50  . Lymphoma Maternal Aunt        dx in her 74s  . Melanoma Cousin 76       maternal first cousin  . Brain cancer Cousin        paternal first cousin dx under 66  . Prostate cancer Other        MGF's father  . Colon cancer Other        MGM's mother    CURRENT MEDICATIONS:  Outpatient Encounter Medications as of 05/27/2018  Medication Sig  . acetaminophen (TYLENOL) 325 MG tablet Take 650 mg by mouth every 6 (six) hours as needed for moderate pain or headache.  . calcium carbonate (TUMS - DOSED IN MG ELEMENTAL CALCIUM) 500 MG chewable tablet Chew 2 tablets by mouth daily.   . Calcium-Phosphorus-Vitamin D (CALCIUM/D3 ADULT GUMMIES PO) Take 2 tablets by mouth 2 (two) times daily. dosage of calcium 1255m and vitamin d 10026m . desvenlafaxine (PRISTIQ) 100 MG 24 hr tablet Take 1 tablet (100 mg total) by mouth daily.  . DUREZOL 0.05 % EMUL INSTILL ONE DROP IN THE RIGHT EYE TWICE DAILY TIME ONE WEEK THEN ONCE DAILY  . furosemide (LASIX) 20 MG tablet TAKE 1 TABLET BY MOUTH DAILY  . gabapentin (NEURONTIN) 300 MG capsule Take 2 capsules (600 mg total) by mouth at bedtime.  . Marland Kitchenactulose (CHRONULAC) 10 GM/15ML solution Take 30 mLs (20 g total) by mouth 2 (two) times daily.  . Marland Kitchenidocaine-prilocaine (EMLA) cream Apply 1 application topically daily as needed  (apply to port before chemo).  . LORazepam (ATIVAN) 0.5 MG tablet TAKE 1 TABLET BY MOUTH EVERY 6 HOURS AS NEEDED FOR ANXIETY  . Melatonin 10 MG CAPS Take 0.5 tablets by mouth at bedtime. Pt states she takes one tablet at night for sleep   . morphine (MS CONTIN) 30 MG 12 hr tablet TAKE 1 TABLET BY MOUTH EVERY TWELVE HOURS  . ondansetron (ZOFRAN) 8 MG tablet TAKE ONE TABLET BY MOUTH EVERY 8 HOURS AS NEEDED FOR NAUSEA AND VOMITING  . pantoprazole (PROTONIX) 40 MG tablet Take 1 tablet (40 mg total) by mouth every 12 (twelve) hours.  . potassium chloride SA (K-DUR,KLOR-CON) 20 MEQ tablet Take 2 tablets (40 mEq total) by mouth daily.  . Alveda Reasons0 MG TABS  tablet TAKE 1 TABLET BY MOUTH EVERY DAY WITH SUPPER  . Zoledronic Acid (ZOMETA IV) Inject into the vein every 3 (three) months.  . zolpidem (AMBIEN) 10 MG tablet TAKE 1 TABLET BY MOUTH AT BEDTIME AS NEEDED FOR SLEEP  . [DISCONTINUED] lactulose (CHRONULAC) 10 GM/15ML solution Take 30 mLs (20 g total) by mouth 2 (two) times daily.  Marland Kitchen dicyclomine (BENTYL) 20 MG tablet Take 1 tablet (20 mg total) by mouth 4 (four) times daily -  before meals and at bedtime. (Patient not taking: Reported on 05/27/2018)  . diphenhydrAMINE (BENADRYL) 25 mg capsule Take 25 mg by mouth at bedtime as needed for itching.   Marland Kitchen NARCAN 4 MG/0.1ML LIQD nasal spray kit CALL 911. ADMINISTER A SINGLE SPRAY OF NARCAN IN ONE NOSTRIL, REPEAT EVERY 3 MINUTES AS NEEDED IF NO OR MINIMAL RESPONSE  . oxyCODONE (OXY IR/ROXICODONE) 5 MG immediate release tablet Take 1-2 tablets (5-10 mg total) by mouth every 4 (four) hours as needed for moderate pain. (Patient not taking: Reported on 05/27/2018)  . promethazine (PHENERGAN) 25 MG tablet TAKE 1 TABLET BY MOUTH EVERY SIX HOURS AS NEEDED FOR NAUSEA OR VOMITING (Patient not taking: Reported on 05/27/2018)  . [DISCONTINUED] cefdinir (OMNICEF) 300 MG capsule Take 1 capsule (300 mg total) by mouth 2 (two) times daily.  . [DISCONTINUED] diphenoxylate-atropine  (LOMOTIL) 2.5-0.025 MG tablet Take 2 tablets by mouth 4 (four) times daily as needed for diarrhea or loose stools.  . [DISCONTINUED] spironolactone (ALDACTONE) 25 MG tablet Take 1 tablet (25 mg total) by mouth daily.   No facility-administered encounter medications on file as of 05/27/2018.     ALLERGIES:  No Known Allergies   PHYSICAL EXAM:  ECOG Performance status: 1  Vitals:   05/27/18 1100  BP: (!) 118/42  Pulse: 80  Resp: 16  Temp: 98.4 F (36.9 C)  SpO2: 99%   Filed Weights   05/27/18 1100  Weight: 270 lb 5 oz (122.6 kg)    Physical Exam Constitutional:      Appearance: Normal appearance. She is normal weight.  Musculoskeletal: Normal range of motion.  Skin:    General: Skin is warm and dry.  Neurological:     Mental Status: She is alert and oriented to person, place, and time. Mental status is at baseline.  Psychiatric:        Mood and Affect: Mood normal.        Behavior: Behavior normal.        Thought Content: Thought content normal.        Judgment: Judgment normal.      LABORATORY DATA:  I have reviewed the labs as listed.  CBC    Component Value Date/Time   WBC 6.0 05/27/2018 1022   RBC 3.27 (L) 05/27/2018 1022   HGB 7.5 (L) 05/27/2018 1022   HCT 27.0 (L) 05/27/2018 1022   PLT 190 05/27/2018 1022   MCV 82.6 05/27/2018 1022   MCH 22.9 (L) 05/27/2018 1022   MCHC 27.8 (L) 05/27/2018 1022   RDW 20.6 (H) 05/27/2018 1022   LYMPHSABS 0.7 05/27/2018 1022   MONOABS 0.7 05/27/2018 1022   EOSABS 0.1 05/27/2018 1022   BASOSABS 0.0 05/27/2018 1022   CMP Latest Ref Rng & Units 05/27/2018 05/16/2018 05/15/2018  Glucose 70 - 99 mg/dL 149(H) 117(H) 120(H)  BUN 6 - 20 mg/dL 6 <5(L) 5(L)  Creatinine 0.44 - 1.00 mg/dL 0.85 0.78 0.80  Sodium 135 - 145 mmol/L 134(L) 137 137  Potassium 3.5 - 5.1 mmol/L  3.3(L) 3.2(L) 2.7(LL)  Chloride 98 - 111 mmol/L 96(L) 107 108  CO2 22 - 32 mmol/L '27 24 23  ' Calcium 8.9 - 10.3 mg/dL 8.7(L) 8.7(L) 8.4(L)  Total Protein 6.5 -  8.1 g/dL 6.7 6.7 6.6  Total Bilirubin 0.3 - 1.2 mg/dL 0.6 0.6 0.6  Alkaline Phos 38 - 126 U/L 125 123 128(H)  AST 15 - 41 U/L 40 67(H) 64(H)  ALT 0 - 44 U/L '20 24 22       ' DIAGNOSTIC IMAGING:  I have independently reviewed the scans and discussed with the patient.   I have reviewed Francene Finders, NP's note and agree with the documentation.  I personally performed a face-to-face visit, made revisions and my assessment and plan is as follows.    ASSESSMENT & PLAN:   Breast cancer, stage 4 ([place]) 1.  Metastatic HER-2 positive breast cancer to the bones and brain: - Patient took Xeloda and Tykerb for over a year, Xeloda discontinued secondary to poor tolerance. - Herceptin and Tykerb from 07/23/2017 through October 2019 -PET/CT scan on 02/21/2018 showed worsening disease with increasing T10 and right scapular lesions, new abnormal uptake within T11 vertebra. -Brain MRI on 02/23/2018 did not show any progression. -MRI T-spine on 03/11/2018 shows osseous metastatic disease most notable at T10, there is a left preferential epidural tumor extension causing left foraminal impingement at T10-11.  She reportedly completed radiation to the spine during the week of Thanksgiving. - Kadcyla started on 03/04/2018, cycle 4 on 05/06/2018. -Patient was brought in by her husband today as she is feeling very weak particularly in the legs.  She reportedly lost control of her bladder for the last few days. -She sustained a fall when she was trying to get up from the commode on Tuesday.  She went to the ER and a CT of the head and C-spine were done which did not show significant findings. - She presented with severe weakness and confusion on 05/13/2018. -She was admitted to the hospital from 05/13/2018 through 12/10/4233 with metabolic encephalopathy.  She was started on lactulose which she is taking twice a day. -I have reviewed MRI of the brain, thoracic and lumbar spine from recent hospitalization which did not  reveal any new lesions. - She was in her right mind for couple of days after discharge to home.  After that she started experiencing hallucinations. -She is also progressively becoming weaker. -We had a prolonged discussion about continuing therapy versus hospice.  She and her husband were leaning towards hospice.  We will place a consultation to [facility] hospice. -Her hemoglobin was 7.4.  We will order 1 unit of blood transfusion.        Orders placed this encounter:  Orders Placed This Encounter  Procedures  . CBC with Differential/Platelet  . Comprehensive metabolic panel  . Ammonia  . Type and screen  . Prepare RBC      Derek Jack, MD [place] [phone]

## 2018-05-27 NOTE — Progress Notes (Signed)
No treatment today per MD.  

## 2018-05-27 NOTE — Assessment & Plan Note (Signed)
1.  Metastatic HER-2 positive breast cancer to the bones and brain: - Patient took Xeloda and Tykerb for over a year, Xeloda discontinued secondary to poor tolerance. - Herceptin and Tykerb from 07/23/2017 through October 2019 -PET/CT scan on 02/21/2018 showed worsening disease with increasing T10 and right scapular lesions, new abnormal uptake within T11 vertebra. -Brain MRI on 02/23/2018 did not show any progression. -MRI T-spine on 03/11/2018 shows osseous metastatic disease most notable at T10, there is a left preferential epidural tumor extension causing left foraminal impingement at T10-11.  She reportedly completed radiation to the spine during the week of Thanksgiving. - Kadcyla started on 03/04/2018, cycle 4 on 05/06/2018. -Patient was brought in by her husband today as she is feeling very weak particularly in the legs.  She reportedly lost control of her bladder for the last few days. -She sustained a fall when she was trying to get up from the commode on Tuesday.  She went to the ER and a CT of the head and C-spine were done which did not show significant findings. - She presented with severe weakness and confusion on 05/13/2018. -She was admitted to the hospital from 05/13/2018 through 06/20/7987 with metabolic encephalopathy.  She was started on lactulose which she is taking twice a day. -I have reviewed MRI of the brain, thoracic and lumbar spine from recent hospitalization which did not reveal any new lesions. - She was in her right mind for couple of days after discharge to home.  After that she started experiencing hallucinations. -She is also progressively becoming weaker. -We had a prolonged discussion about continuing therapy versus hospice.  She and her husband were leaning towards hospice.  We will place a consultation to [facility] hospice. -Her hemoglobin was 7.4.  We will order 1 unit of blood transfusion.

## 2018-05-27 NOTE — Patient Instructions (Signed)
[facility] at [facility] Discharge Instructions     Thank you for choosing [facility] at [facility] to provide your oncology and hematology care.  To afford each patient quality time with our provider, please arrive at least 15 minutes before your scheduled appointment time.   If you have a lab appointment with the Cancer Center please come in thru the  Main Entrance and check in at the main information desk  You need to re-schedule your appointment should you arrive 10 or more minutes late.  We strive to give you quality time with our providers, and arriving late affects you and other patients whose appointments are after yours.  Also, if you no show three or more times for appointments you may be dismissed from the clinic at the providers discretion.     Again, thank you for choosing [facility].  Our hope is that these requests will decrease the amount of time that you wait before being seen by our physicians.       _____________________________________________________________  Should you have questions after your visit to [facility], please contact our office at [phone] between the hours of 8:00 a.m. and 4:30 p.m.  Voicemails left after 4:00 p.m. will not be returned until the following business day.  For prescription refill requests, have your pharmacy contact our office and allow 72 hours.    Cancer Center Support Programs:   > Cancer Support Group  2nd Tuesday of the month 1pm-2pm, Journey Room    

## 2018-05-27 NOTE — Progress Notes (Signed)
NO treatment today she wishes to go to hospice. We will sched blood transfusion for Monday tho.

## 2018-05-28 LAB — CANCER ANTIGEN 27.29: CA 27.29: 47.3 U/mL — ABNORMAL HIGH (ref 0.0–38.6)

## 2018-05-28 LAB — CANCER ANTIGEN 15-3: CA 15-3: 35.6 U/mL — ABNORMAL HIGH (ref 0.0–25.0)

## 2018-05-30 ENCOUNTER — Encounter (HOSPITAL_COMMUNITY): Payer: Medicare Other

## 2018-05-30 ENCOUNTER — Encounter (HOSPITAL_COMMUNITY)
Admission: RE | Admit: 2018-05-30 | Discharge: 2018-05-30 | Disposition: A | Discharge status: Home / Self Care | Payer: Medicare Other | Source: Ambulatory Visit | Attending: Hematology | Admitting: Hematology

## 2018-05-30 ENCOUNTER — Encounter (HOSPITAL_COMMUNITY): Admission: RE | Admit: 2018-05-30 | Payer: Medicare Other | Source: Ambulatory Visit

## 2018-05-30 ENCOUNTER — Encounter (HOSPITAL_COMMUNITY): Payer: Self-pay | Admitting: Hematology

## 2018-05-30 DIAGNOSIS — C7951 Secondary malignant neoplasm of bone: Secondary | ICD-10-CM | POA: Diagnosis not present

## 2018-05-30 DIAGNOSIS — Z5112 Encounter for antineoplastic immunotherapy: Secondary | ICD-10-CM | POA: Diagnosis not present

## 2018-05-30 DIAGNOSIS — C787 Secondary malignant neoplasm of liver and intrahepatic bile duct: Secondary | ICD-10-CM | POA: Diagnosis not present

## 2018-05-30 DIAGNOSIS — C50919 Malignant neoplasm of unspecified site of unspecified female breast: Secondary | ICD-10-CM | POA: Insufficient documentation

## 2018-05-30 DIAGNOSIS — C50912 Malignant neoplasm of unspecified site of left female breast: Secondary | ICD-10-CM | POA: Diagnosis not present

## 2018-05-30 DIAGNOSIS — Z170 Estrogen receptor positive status [ER+]: Secondary | ICD-10-CM | POA: Diagnosis not present

## 2018-05-30 DIAGNOSIS — C7931 Secondary malignant neoplasm of brain: Secondary | ICD-10-CM | POA: Diagnosis not present

## 2018-05-30 LAB — HEMOGLOBIN AND HEMATOCRIT, BLOOD
HEMATOCRIT: 29.4 % — AB (ref 36.0–46.0)
HEMOGLOBIN: 8.5 g/dL — AB (ref 12.0–15.0)

## 2018-05-30 MED ORDER — SODIUM CHLORIDE 0.9% IV SOLUTION
250.0000 mL | Freq: Once | INTRAVENOUS | Status: AC
  Administered 2018-05-30: 250 mL via INTRAVENOUS

## 2018-05-30 MED ORDER — ACETAMINOPHEN 325 MG PO TABS
650.0000 mg | ORAL_TABLET | Freq: Once | ORAL | Status: AC
  Administered 2018-05-30: 650 mg via ORAL
  Filled 2018-05-30: qty 2

## 2018-05-30 MED ORDER — SODIUM CHLORIDE 0.9% FLUSH: 10.0000 mL | INTRAVENOUS | Status: DC | PRN

## 2018-05-30 MED ORDER — DIPHENHYDRAMINE HCL 25 MG PO CAPS
25.0000 mg | ORAL_CAPSULE | Freq: Once | ORAL | Status: AC
  Administered 2018-05-30: 25 mg via ORAL
  Filled 2018-05-30: qty 1

## 2018-05-30 MED ORDER — HEPARIN SOD (PORK) LOCK FLUSH 100 UNIT/ML IV SOLN: 500.0000 [IU] | INTRAVENOUS | Status: DC | PRN

## 2018-05-30 NOTE — Progress Notes (Signed)
Results for HARLYNN, KIMBELL (MRN [id number]) as of 05/30/2018 16:28  Ref. Range 05/30/2018 15:46  Hemoglobin Latest Ref Range: 12.0 - 15.0 g/dL 8.5 (L)  HCT Latest Ref Range: 36.0 - 46.0 % 29.4 (L)

## 2018-05-30 NOTE — Progress Notes (Signed)
Blood drawn from right [facility] and sent to lab for Hgb/Hct results.

## 2018-05-30 NOTE — Progress Notes (Signed)
Faxed referral/patient records to [facility] [place]. [phone]

## 2018-05-31 ENCOUNTER — Other Ambulatory Visit (HOSPITAL_COMMUNITY): Payer: Self-pay | Admitting: Internal Medicine

## 2018-05-31 DIAGNOSIS — C50919 Malignant neoplasm of unspecified site of unspecified female breast: Secondary | ICD-10-CM | POA: Diagnosis not present

## 2018-05-31 DIAGNOSIS — K219 Gastro-esophageal reflux disease without esophagitis: Secondary | ICD-10-CM | POA: Diagnosis not present

## 2018-05-31 DIAGNOSIS — R630 Anorexia: Secondary | ICD-10-CM | POA: Diagnosis not present

## 2018-05-31 DIAGNOSIS — R5382 Chronic fatigue, unspecified: Secondary | ICD-10-CM

## 2018-05-31 DIAGNOSIS — C7951 Secondary malignant neoplasm of bone: Secondary | ICD-10-CM

## 2018-05-31 DIAGNOSIS — R531 Weakness: Secondary | ICD-10-CM | POA: Diagnosis not present

## 2018-05-31 DIAGNOSIS — R52 Pain, unspecified: Secondary | ICD-10-CM | POA: Diagnosis not present

## 2018-05-31 DIAGNOSIS — R42 Dizziness and giddiness: Secondary | ICD-10-CM | POA: Diagnosis not present

## 2018-05-31 DIAGNOSIS — Z0000 Encounter for general adult medical examination without abnormal findings: Secondary | ICD-10-CM

## 2018-05-31 DIAGNOSIS — C229 Malignant neoplasm of liver, not specified as primary or secondary: Secondary | ICD-10-CM

## 2018-05-31 LAB — TYPE AND SCREEN
ABO/RH(D): A NEG
Antibody Screen: NEGATIVE
Unit division: 0
Unit division: 0

## 2018-05-31 LAB — BPAM RBC
Blood Product Expiration Date: 202002152359
Blood Product Expiration Date: 202002192359
ISSUE DATE / TIME: 202001171834
ISSUE DATE / TIME: 202001201238
Unit Type and Rh: 600
Unit Type and Rh: 600

## 2018-06-01 ENCOUNTER — Ambulatory Visit (HOSPITAL_COMMUNITY): Payer: Self-pay | Admitting: Hematology

## 2018-06-03 DIAGNOSIS — R52 Pain, unspecified: Secondary | ICD-10-CM | POA: Diagnosis not present

## 2018-06-03 DIAGNOSIS — C50919 Malignant neoplasm of unspecified site of unspecified female breast: Secondary | ICD-10-CM | POA: Diagnosis not present

## 2018-06-03 DIAGNOSIS — R630 Anorexia: Secondary | ICD-10-CM | POA: Diagnosis not present

## 2018-06-03 DIAGNOSIS — K219 Gastro-esophageal reflux disease without esophagitis: Secondary | ICD-10-CM | POA: Diagnosis not present

## 2018-06-03 DIAGNOSIS — R531 Weakness: Secondary | ICD-10-CM | POA: Diagnosis not present

## 2018-06-03 DIAGNOSIS — R42 Dizziness and giddiness: Secondary | ICD-10-CM | POA: Diagnosis not present

## 2018-06-05 ENCOUNTER — Other Ambulatory Visit (HOSPITAL_COMMUNITY): Payer: Self-pay | Admitting: Internal Medicine

## 2018-06-05 DIAGNOSIS — C50919 Malignant neoplasm of unspecified site of unspecified female breast: Secondary | ICD-10-CM

## 2018-06-05 DIAGNOSIS — C229 Malignant neoplasm of liver, not specified as primary or secondary: Secondary | ICD-10-CM

## 2018-06-05 DIAGNOSIS — R5382 Chronic fatigue, unspecified: Secondary | ICD-10-CM

## 2018-06-05 DIAGNOSIS — C7951 Secondary malignant neoplasm of bone: Secondary | ICD-10-CM

## 2018-06-05 DIAGNOSIS — Z0000 Encounter for general adult medical examination without abnormal findings: Secondary | ICD-10-CM

## 2018-06-07 DIAGNOSIS — K219 Gastro-esophageal reflux disease without esophagitis: Secondary | ICD-10-CM | POA: Diagnosis not present

## 2018-06-07 DIAGNOSIS — R42 Dizziness and giddiness: Secondary | ICD-10-CM | POA: Diagnosis not present

## 2018-06-07 DIAGNOSIS — R630 Anorexia: Secondary | ICD-10-CM | POA: Diagnosis not present

## 2018-06-07 DIAGNOSIS — C50919 Malignant neoplasm of unspecified site of unspecified female breast: Secondary | ICD-10-CM | POA: Diagnosis not present

## 2018-06-07 DIAGNOSIS — R531 Weakness: Secondary | ICD-10-CM | POA: Diagnosis not present

## 2018-06-07 DIAGNOSIS — R52 Pain, unspecified: Secondary | ICD-10-CM | POA: Diagnosis not present

## 2018-06-10 DIAGNOSIS — R531 Weakness: Secondary | ICD-10-CM | POA: Diagnosis not present

## 2018-06-10 DIAGNOSIS — R52 Pain, unspecified: Secondary | ICD-10-CM | POA: Diagnosis not present

## 2018-06-10 DIAGNOSIS — R630 Anorexia: Secondary | ICD-10-CM | POA: Diagnosis not present

## 2018-06-10 DIAGNOSIS — C50919 Malignant neoplasm of unspecified site of unspecified female breast: Secondary | ICD-10-CM | POA: Diagnosis not present

## 2018-06-10 DIAGNOSIS — R42 Dizziness and giddiness: Secondary | ICD-10-CM | POA: Diagnosis not present

## 2018-06-10 DIAGNOSIS — K219 Gastro-esophageal reflux disease without esophagitis: Secondary | ICD-10-CM | POA: Diagnosis not present

## 2018-06-11 DIAGNOSIS — R630 Anorexia: Secondary | ICD-10-CM | POA: Diagnosis not present

## 2018-06-11 DIAGNOSIS — R531 Weakness: Secondary | ICD-10-CM | POA: Diagnosis not present

## 2018-06-11 DIAGNOSIS — K219 Gastro-esophageal reflux disease without esophagitis: Secondary | ICD-10-CM | POA: Diagnosis not present

## 2018-06-11 DIAGNOSIS — C50919 Malignant neoplasm of unspecified site of unspecified female breast: Secondary | ICD-10-CM | POA: Diagnosis not present

## 2018-06-11 DIAGNOSIS — R42 Dizziness and giddiness: Secondary | ICD-10-CM | POA: Diagnosis not present

## 2018-06-11 DIAGNOSIS — R52 Pain, unspecified: Secondary | ICD-10-CM | POA: Diagnosis not present

## 2018-06-14 DIAGNOSIS — R531 Weakness: Secondary | ICD-10-CM | POA: Diagnosis not present

## 2018-06-14 DIAGNOSIS — R52 Pain, unspecified: Secondary | ICD-10-CM | POA: Diagnosis not present

## 2018-06-14 DIAGNOSIS — R42 Dizziness and giddiness: Secondary | ICD-10-CM | POA: Diagnosis not present

## 2018-06-14 DIAGNOSIS — R630 Anorexia: Secondary | ICD-10-CM | POA: Diagnosis not present

## 2018-06-14 DIAGNOSIS — C50919 Malignant neoplasm of unspecified site of unspecified female breast: Secondary | ICD-10-CM | POA: Diagnosis not present

## 2018-06-14 DIAGNOSIS — K219 Gastro-esophageal reflux disease without esophagitis: Secondary | ICD-10-CM | POA: Diagnosis not present

## 2018-06-17 DIAGNOSIS — K219 Gastro-esophageal reflux disease without esophagitis: Secondary | ICD-10-CM | POA: Diagnosis not present

## 2018-06-17 DIAGNOSIS — R42 Dizziness and giddiness: Secondary | ICD-10-CM | POA: Diagnosis not present

## 2018-06-17 DIAGNOSIS — R52 Pain, unspecified: Secondary | ICD-10-CM | POA: Diagnosis not present

## 2018-06-17 DIAGNOSIS — R630 Anorexia: Secondary | ICD-10-CM | POA: Diagnosis not present

## 2018-06-17 DIAGNOSIS — C50919 Malignant neoplasm of unspecified site of unspecified female breast: Secondary | ICD-10-CM | POA: Diagnosis not present

## 2018-06-17 DIAGNOSIS — R531 Weakness: Secondary | ICD-10-CM | POA: Diagnosis not present

## 2018-06-21 DIAGNOSIS — R630 Anorexia: Secondary | ICD-10-CM | POA: Diagnosis not present

## 2018-06-21 DIAGNOSIS — K219 Gastro-esophageal reflux disease without esophagitis: Secondary | ICD-10-CM | POA: Diagnosis not present

## 2018-06-21 DIAGNOSIS — R531 Weakness: Secondary | ICD-10-CM | POA: Diagnosis not present

## 2018-06-21 DIAGNOSIS — R52 Pain, unspecified: Secondary | ICD-10-CM | POA: Diagnosis not present

## 2018-06-21 DIAGNOSIS — R42 Dizziness and giddiness: Secondary | ICD-10-CM | POA: Diagnosis not present

## 2018-06-21 DIAGNOSIS — C50919 Malignant neoplasm of unspecified site of unspecified female breast: Secondary | ICD-10-CM | POA: Diagnosis not present

## 2018-06-24 DIAGNOSIS — R52 Pain, unspecified: Secondary | ICD-10-CM | POA: Diagnosis not present

## 2018-06-24 DIAGNOSIS — K219 Gastro-esophageal reflux disease without esophagitis: Secondary | ICD-10-CM | POA: Diagnosis not present

## 2018-06-24 DIAGNOSIS — R630 Anorexia: Secondary | ICD-10-CM | POA: Diagnosis not present

## 2018-06-24 DIAGNOSIS — R42 Dizziness and giddiness: Secondary | ICD-10-CM | POA: Diagnosis not present

## 2018-06-24 DIAGNOSIS — C50919 Malignant neoplasm of unspecified site of unspecified female breast: Secondary | ICD-10-CM | POA: Diagnosis not present

## 2018-06-24 DIAGNOSIS — R531 Weakness: Secondary | ICD-10-CM | POA: Diagnosis not present

## 2018-06-28 DIAGNOSIS — R630 Anorexia: Secondary | ICD-10-CM | POA: Diagnosis not present

## 2018-06-28 DIAGNOSIS — R42 Dizziness and giddiness: Secondary | ICD-10-CM | POA: Diagnosis not present

## 2018-06-28 DIAGNOSIS — C50919 Malignant neoplasm of unspecified site of unspecified female breast: Secondary | ICD-10-CM | POA: Diagnosis not present

## 2018-06-28 DIAGNOSIS — R52 Pain, unspecified: Secondary | ICD-10-CM | POA: Diagnosis not present

## 2018-06-28 DIAGNOSIS — R531 Weakness: Secondary | ICD-10-CM | POA: Diagnosis not present

## 2018-06-28 DIAGNOSIS — K219 Gastro-esophageal reflux disease without esophagitis: Secondary | ICD-10-CM | POA: Diagnosis not present

## 2018-07-01 DIAGNOSIS — K219 Gastro-esophageal reflux disease without esophagitis: Secondary | ICD-10-CM | POA: Diagnosis not present

## 2018-07-01 DIAGNOSIS — R42 Dizziness and giddiness: Secondary | ICD-10-CM | POA: Diagnosis not present

## 2018-07-01 DIAGNOSIS — C50919 Malignant neoplasm of unspecified site of unspecified female breast: Secondary | ICD-10-CM | POA: Diagnosis not present

## 2018-07-01 DIAGNOSIS — R630 Anorexia: Secondary | ICD-10-CM | POA: Diagnosis not present

## 2018-07-01 DIAGNOSIS — R52 Pain, unspecified: Secondary | ICD-10-CM | POA: Diagnosis not present

## 2018-07-01 DIAGNOSIS — R531 Weakness: Secondary | ICD-10-CM | POA: Diagnosis not present

## 2018-07-05 DIAGNOSIS — K219 Gastro-esophageal reflux disease without esophagitis: Secondary | ICD-10-CM | POA: Diagnosis not present

## 2018-07-05 DIAGNOSIS — R531 Weakness: Secondary | ICD-10-CM | POA: Diagnosis not present

## 2018-07-05 DIAGNOSIS — C50919 Malignant neoplasm of unspecified site of unspecified female breast: Secondary | ICD-10-CM | POA: Diagnosis not present

## 2018-07-05 DIAGNOSIS — R52 Pain, unspecified: Secondary | ICD-10-CM | POA: Diagnosis not present

## 2018-07-05 DIAGNOSIS — R42 Dizziness and giddiness: Secondary | ICD-10-CM | POA: Diagnosis not present

## 2018-07-05 DIAGNOSIS — R630 Anorexia: Secondary | ICD-10-CM | POA: Diagnosis not present

## 2018-07-07 ENCOUNTER — Encounter (INDEPENDENT_AMBULATORY_CARE_PROVIDER_SITE_OTHER): Payer: Self-pay | Admitting: *Deleted

## 2018-07-08 DIAGNOSIS — R52 Pain, unspecified: Secondary | ICD-10-CM | POA: Diagnosis not present

## 2018-07-08 DIAGNOSIS — R531 Weakness: Secondary | ICD-10-CM | POA: Diagnosis not present

## 2018-07-08 DIAGNOSIS — R42 Dizziness and giddiness: Secondary | ICD-10-CM | POA: Diagnosis not present

## 2018-07-08 DIAGNOSIS — K219 Gastro-esophageal reflux disease without esophagitis: Secondary | ICD-10-CM | POA: Diagnosis not present

## 2018-07-08 DIAGNOSIS — R630 Anorexia: Secondary | ICD-10-CM | POA: Diagnosis not present

## 2018-07-08 DIAGNOSIS — C50919 Malignant neoplasm of unspecified site of unspecified female breast: Secondary | ICD-10-CM | POA: Diagnosis not present

## 2018-07-10 DIAGNOSIS — R52 Pain, unspecified: Secondary | ICD-10-CM | POA: Diagnosis not present

## 2018-07-10 DIAGNOSIS — K219 Gastro-esophageal reflux disease without esophagitis: Secondary | ICD-10-CM | POA: Diagnosis not present

## 2018-07-10 DIAGNOSIS — R630 Anorexia: Secondary | ICD-10-CM | POA: Diagnosis not present

## 2018-07-10 DIAGNOSIS — R42 Dizziness and giddiness: Secondary | ICD-10-CM | POA: Diagnosis not present

## 2018-07-10 DIAGNOSIS — R531 Weakness: Secondary | ICD-10-CM | POA: Diagnosis not present

## 2018-07-10 DIAGNOSIS — C50919 Malignant neoplasm of unspecified site of unspecified female breast: Secondary | ICD-10-CM | POA: Diagnosis not present

## 2018-07-12 DIAGNOSIS — R52 Pain, unspecified: Secondary | ICD-10-CM | POA: Diagnosis not present

## 2018-07-12 DIAGNOSIS — R630 Anorexia: Secondary | ICD-10-CM | POA: Diagnosis not present

## 2018-07-12 DIAGNOSIS — C50919 Malignant neoplasm of unspecified site of unspecified female breast: Secondary | ICD-10-CM | POA: Diagnosis not present

## 2018-07-12 DIAGNOSIS — R531 Weakness: Secondary | ICD-10-CM | POA: Diagnosis not present

## 2018-07-12 DIAGNOSIS — R42 Dizziness and giddiness: Secondary | ICD-10-CM | POA: Diagnosis not present

## 2018-07-12 DIAGNOSIS — K219 Gastro-esophageal reflux disease without esophagitis: Secondary | ICD-10-CM | POA: Diagnosis not present

## 2018-07-13 ENCOUNTER — Ambulatory Visit: Payer: 59 | Admitting: Neurology

## 2018-07-15 DIAGNOSIS — R531 Weakness: Secondary | ICD-10-CM | POA: Diagnosis not present

## 2018-07-15 DIAGNOSIS — R42 Dizziness and giddiness: Secondary | ICD-10-CM | POA: Diagnosis not present

## 2018-07-15 DIAGNOSIS — K219 Gastro-esophageal reflux disease without esophagitis: Secondary | ICD-10-CM | POA: Diagnosis not present

## 2018-07-15 DIAGNOSIS — R630 Anorexia: Secondary | ICD-10-CM | POA: Diagnosis not present

## 2018-07-15 DIAGNOSIS — R52 Pain, unspecified: Secondary | ICD-10-CM | POA: Diagnosis not present

## 2018-07-15 DIAGNOSIS — C50919 Malignant neoplasm of unspecified site of unspecified female breast: Secondary | ICD-10-CM | POA: Diagnosis not present

## 2018-07-19 DIAGNOSIS — R531 Weakness: Secondary | ICD-10-CM | POA: Diagnosis not present

## 2018-07-19 DIAGNOSIS — K219 Gastro-esophageal reflux disease without esophagitis: Secondary | ICD-10-CM | POA: Diagnosis not present

## 2018-07-19 DIAGNOSIS — C50919 Malignant neoplasm of unspecified site of unspecified female breast: Secondary | ICD-10-CM | POA: Diagnosis not present

## 2018-07-19 DIAGNOSIS — R42 Dizziness and giddiness: Secondary | ICD-10-CM | POA: Diagnosis not present

## 2018-07-19 DIAGNOSIS — R630 Anorexia: Secondary | ICD-10-CM | POA: Diagnosis not present

## 2018-07-19 DIAGNOSIS — R52 Pain, unspecified: Secondary | ICD-10-CM | POA: Diagnosis not present

## 2018-07-22 DIAGNOSIS — R52 Pain, unspecified: Secondary | ICD-10-CM | POA: Diagnosis not present

## 2018-07-22 DIAGNOSIS — R531 Weakness: Secondary | ICD-10-CM | POA: Diagnosis not present

## 2018-07-22 DIAGNOSIS — C50919 Malignant neoplasm of unspecified site of unspecified female breast: Secondary | ICD-10-CM | POA: Diagnosis not present

## 2018-07-22 DIAGNOSIS — K219 Gastro-esophageal reflux disease without esophagitis: Secondary | ICD-10-CM | POA: Diagnosis not present

## 2018-07-22 DIAGNOSIS — R42 Dizziness and giddiness: Secondary | ICD-10-CM | POA: Diagnosis not present

## 2018-07-22 DIAGNOSIS — R630 Anorexia: Secondary | ICD-10-CM | POA: Diagnosis not present

## 2018-07-26 DIAGNOSIS — C50919 Malignant neoplasm of unspecified site of unspecified female breast: Secondary | ICD-10-CM | POA: Diagnosis not present

## 2018-07-26 DIAGNOSIS — R630 Anorexia: Secondary | ICD-10-CM | POA: Diagnosis not present

## 2018-07-26 DIAGNOSIS — R531 Weakness: Secondary | ICD-10-CM | POA: Diagnosis not present

## 2018-07-26 DIAGNOSIS — R52 Pain, unspecified: Secondary | ICD-10-CM | POA: Diagnosis not present

## 2018-07-26 DIAGNOSIS — R42 Dizziness and giddiness: Secondary | ICD-10-CM | POA: Diagnosis not present

## 2018-07-26 DIAGNOSIS — K219 Gastro-esophageal reflux disease without esophagitis: Secondary | ICD-10-CM | POA: Diagnosis not present

## 2018-07-29 DIAGNOSIS — C50919 Malignant neoplasm of unspecified site of unspecified female breast: Secondary | ICD-10-CM | POA: Diagnosis not present

## 2018-07-29 DIAGNOSIS — K219 Gastro-esophageal reflux disease without esophagitis: Secondary | ICD-10-CM | POA: Diagnosis not present

## 2018-07-29 DIAGNOSIS — R52 Pain, unspecified: Secondary | ICD-10-CM | POA: Diagnosis not present

## 2018-07-29 DIAGNOSIS — R531 Weakness: Secondary | ICD-10-CM | POA: Diagnosis not present

## 2018-07-29 DIAGNOSIS — R630 Anorexia: Secondary | ICD-10-CM | POA: Diagnosis not present

## 2018-07-29 DIAGNOSIS — R42 Dizziness and giddiness: Secondary | ICD-10-CM | POA: Diagnosis not present

## 2018-08-02 DIAGNOSIS — R531 Weakness: Secondary | ICD-10-CM | POA: Diagnosis not present

## 2018-08-02 DIAGNOSIS — R52 Pain, unspecified: Secondary | ICD-10-CM | POA: Diagnosis not present

## 2018-08-02 DIAGNOSIS — R630 Anorexia: Secondary | ICD-10-CM | POA: Diagnosis not present

## 2018-08-02 DIAGNOSIS — K219 Gastro-esophageal reflux disease without esophagitis: Secondary | ICD-10-CM | POA: Diagnosis not present

## 2018-08-02 DIAGNOSIS — C50919 Malignant neoplasm of unspecified site of unspecified female breast: Secondary | ICD-10-CM | POA: Diagnosis not present

## 2018-08-02 DIAGNOSIS — R42 Dizziness and giddiness: Secondary | ICD-10-CM | POA: Diagnosis not present

## 2018-08-04 DIAGNOSIS — R42 Dizziness and giddiness: Secondary | ICD-10-CM | POA: Diagnosis not present

## 2018-08-04 DIAGNOSIS — C50919 Malignant neoplasm of unspecified site of unspecified female breast: Secondary | ICD-10-CM | POA: Diagnosis not present

## 2018-08-04 DIAGNOSIS — R630 Anorexia: Secondary | ICD-10-CM | POA: Diagnosis not present

## 2018-08-04 DIAGNOSIS — R531 Weakness: Secondary | ICD-10-CM | POA: Diagnosis not present

## 2018-08-04 DIAGNOSIS — K219 Gastro-esophageal reflux disease without esophagitis: Secondary | ICD-10-CM | POA: Diagnosis not present

## 2018-08-04 DIAGNOSIS — R52 Pain, unspecified: Secondary | ICD-10-CM | POA: Diagnosis not present

## 2018-08-09 DIAGNOSIS — R42 Dizziness and giddiness: Secondary | ICD-10-CM | POA: Diagnosis not present

## 2018-08-09 DIAGNOSIS — K219 Gastro-esophageal reflux disease without esophagitis: Secondary | ICD-10-CM | POA: Diagnosis not present

## 2018-08-09 DIAGNOSIS — R531 Weakness: Secondary | ICD-10-CM | POA: Diagnosis not present

## 2018-08-09 DIAGNOSIS — R52 Pain, unspecified: Secondary | ICD-10-CM | POA: Diagnosis not present

## 2018-08-09 DIAGNOSIS — R630 Anorexia: Secondary | ICD-10-CM | POA: Diagnosis not present

## 2018-08-09 DIAGNOSIS — C50919 Malignant neoplasm of unspecified site of unspecified female breast: Secondary | ICD-10-CM | POA: Diagnosis not present

## 2018-08-10 DIAGNOSIS — K219 Gastro-esophageal reflux disease without esophagitis: Secondary | ICD-10-CM | POA: Diagnosis not present

## 2018-08-10 DIAGNOSIS — R630 Anorexia: Secondary | ICD-10-CM | POA: Diagnosis not present

## 2018-08-10 DIAGNOSIS — R52 Pain, unspecified: Secondary | ICD-10-CM | POA: Diagnosis not present

## 2018-08-10 DIAGNOSIS — R531 Weakness: Secondary | ICD-10-CM | POA: Diagnosis not present

## 2018-08-10 DIAGNOSIS — C50919 Malignant neoplasm of unspecified site of unspecified female breast: Secondary | ICD-10-CM | POA: Diagnosis not present

## 2018-08-10 DIAGNOSIS — R42 Dizziness and giddiness: Secondary | ICD-10-CM | POA: Diagnosis not present

## 2018-08-12 DIAGNOSIS — R531 Weakness: Secondary | ICD-10-CM | POA: Diagnosis not present

## 2018-08-12 DIAGNOSIS — K219 Gastro-esophageal reflux disease without esophagitis: Secondary | ICD-10-CM | POA: Diagnosis not present

## 2018-08-12 DIAGNOSIS — R630 Anorexia: Secondary | ICD-10-CM | POA: Diagnosis not present

## 2018-08-12 DIAGNOSIS — R42 Dizziness and giddiness: Secondary | ICD-10-CM | POA: Diagnosis not present

## 2018-08-12 DIAGNOSIS — R52 Pain, unspecified: Secondary | ICD-10-CM | POA: Diagnosis not present

## 2018-08-12 DIAGNOSIS — C50919 Malignant neoplasm of unspecified site of unspecified female breast: Secondary | ICD-10-CM | POA: Diagnosis not present

## 2018-08-15 DIAGNOSIS — R531 Weakness: Secondary | ICD-10-CM | POA: Diagnosis not present

## 2018-08-15 DIAGNOSIS — C50919 Malignant neoplasm of unspecified site of unspecified female breast: Secondary | ICD-10-CM | POA: Diagnosis not present

## 2018-08-15 DIAGNOSIS — R52 Pain, unspecified: Secondary | ICD-10-CM | POA: Diagnosis not present

## 2018-08-15 DIAGNOSIS — R42 Dizziness and giddiness: Secondary | ICD-10-CM | POA: Diagnosis not present

## 2018-08-15 DIAGNOSIS — K219 Gastro-esophageal reflux disease without esophagitis: Secondary | ICD-10-CM | POA: Diagnosis not present

## 2018-08-15 DIAGNOSIS — R630 Anorexia: Secondary | ICD-10-CM | POA: Diagnosis not present

## 2018-08-18 DIAGNOSIS — R42 Dizziness and giddiness: Secondary | ICD-10-CM | POA: Diagnosis not present

## 2018-08-18 DIAGNOSIS — R531 Weakness: Secondary | ICD-10-CM | POA: Diagnosis not present

## 2018-08-18 DIAGNOSIS — C50919 Malignant neoplasm of unspecified site of unspecified female breast: Secondary | ICD-10-CM | POA: Diagnosis not present

## 2018-08-18 DIAGNOSIS — K219 Gastro-esophageal reflux disease without esophagitis: Secondary | ICD-10-CM | POA: Diagnosis not present

## 2018-08-18 DIAGNOSIS — R52 Pain, unspecified: Secondary | ICD-10-CM | POA: Diagnosis not present

## 2018-08-18 DIAGNOSIS — R630 Anorexia: Secondary | ICD-10-CM | POA: Diagnosis not present

## 2018-08-19 DIAGNOSIS — K219 Gastro-esophageal reflux disease without esophagitis: Secondary | ICD-10-CM | POA: Diagnosis not present

## 2018-08-19 DIAGNOSIS — C50919 Malignant neoplasm of unspecified site of unspecified female breast: Secondary | ICD-10-CM | POA: Diagnosis not present

## 2018-08-19 DIAGNOSIS — R630 Anorexia: Secondary | ICD-10-CM | POA: Diagnosis not present

## 2018-08-19 DIAGNOSIS — R531 Weakness: Secondary | ICD-10-CM | POA: Diagnosis not present

## 2018-08-19 DIAGNOSIS — R42 Dizziness and giddiness: Secondary | ICD-10-CM | POA: Diagnosis not present

## 2018-08-19 DIAGNOSIS — R52 Pain, unspecified: Secondary | ICD-10-CM | POA: Diagnosis not present

## 2018-08-22 DIAGNOSIS — R42 Dizziness and giddiness: Secondary | ICD-10-CM | POA: Diagnosis not present

## 2018-08-22 DIAGNOSIS — R531 Weakness: Secondary | ICD-10-CM | POA: Diagnosis not present

## 2018-08-22 DIAGNOSIS — R630 Anorexia: Secondary | ICD-10-CM | POA: Diagnosis not present

## 2018-08-22 DIAGNOSIS — C50919 Malignant neoplasm of unspecified site of unspecified female breast: Secondary | ICD-10-CM | POA: Diagnosis not present

## 2018-08-22 DIAGNOSIS — K219 Gastro-esophageal reflux disease without esophagitis: Secondary | ICD-10-CM | POA: Diagnosis not present

## 2018-08-22 DIAGNOSIS — R52 Pain, unspecified: Secondary | ICD-10-CM | POA: Diagnosis not present

## 2018-08-23 DIAGNOSIS — C50919 Malignant neoplasm of unspecified site of unspecified female breast: Secondary | ICD-10-CM | POA: Diagnosis not present

## 2018-08-23 DIAGNOSIS — R630 Anorexia: Secondary | ICD-10-CM | POA: Diagnosis not present

## 2018-08-23 DIAGNOSIS — K219 Gastro-esophageal reflux disease without esophagitis: Secondary | ICD-10-CM | POA: Diagnosis not present

## 2018-08-23 DIAGNOSIS — R52 Pain, unspecified: Secondary | ICD-10-CM | POA: Diagnosis not present

## 2018-08-23 DIAGNOSIS — R531 Weakness: Secondary | ICD-10-CM | POA: Diagnosis not present

## 2018-08-23 DIAGNOSIS — R42 Dizziness and giddiness: Secondary | ICD-10-CM | POA: Diagnosis not present

## 2018-08-30 DIAGNOSIS — R630 Anorexia: Secondary | ICD-10-CM | POA: Diagnosis not present

## 2018-08-30 DIAGNOSIS — R531 Weakness: Secondary | ICD-10-CM | POA: Diagnosis not present

## 2018-08-30 DIAGNOSIS — K219 Gastro-esophageal reflux disease without esophagitis: Secondary | ICD-10-CM | POA: Diagnosis not present

## 2018-08-30 DIAGNOSIS — C50919 Malignant neoplasm of unspecified site of unspecified female breast: Secondary | ICD-10-CM | POA: Diagnosis not present

## 2018-08-30 DIAGNOSIS — R42 Dizziness and giddiness: Secondary | ICD-10-CM | POA: Diagnosis not present

## 2018-08-30 DIAGNOSIS — R52 Pain, unspecified: Secondary | ICD-10-CM | POA: Diagnosis not present

## 2018-09-01 DIAGNOSIS — K219 Gastro-esophageal reflux disease without esophagitis: Secondary | ICD-10-CM | POA: Diagnosis not present

## 2018-09-01 DIAGNOSIS — R42 Dizziness and giddiness: Secondary | ICD-10-CM | POA: Diagnosis not present

## 2018-09-01 DIAGNOSIS — R52 Pain, unspecified: Secondary | ICD-10-CM | POA: Diagnosis not present

## 2018-09-01 DIAGNOSIS — R531 Weakness: Secondary | ICD-10-CM | POA: Diagnosis not present

## 2018-09-01 DIAGNOSIS — R630 Anorexia: Secondary | ICD-10-CM | POA: Diagnosis not present

## 2018-09-01 DIAGNOSIS — C50919 Malignant neoplasm of unspecified site of unspecified female breast: Secondary | ICD-10-CM | POA: Diagnosis not present

## 2018-09-02 DIAGNOSIS — R531 Weakness: Secondary | ICD-10-CM | POA: Diagnosis not present

## 2018-09-02 DIAGNOSIS — R42 Dizziness and giddiness: Secondary | ICD-10-CM | POA: Diagnosis not present

## 2018-09-02 DIAGNOSIS — R630 Anorexia: Secondary | ICD-10-CM | POA: Diagnosis not present

## 2018-09-02 DIAGNOSIS — K219 Gastro-esophageal reflux disease without esophagitis: Secondary | ICD-10-CM | POA: Diagnosis not present

## 2018-09-02 DIAGNOSIS — C50919 Malignant neoplasm of unspecified site of unspecified female breast: Secondary | ICD-10-CM | POA: Diagnosis not present

## 2018-09-02 DIAGNOSIS — R52 Pain, unspecified: Secondary | ICD-10-CM | POA: Diagnosis not present

## 2018-09-06 DIAGNOSIS — R531 Weakness: Secondary | ICD-10-CM | POA: Diagnosis not present

## 2018-09-06 DIAGNOSIS — C50919 Malignant neoplasm of unspecified site of unspecified female breast: Secondary | ICD-10-CM | POA: Diagnosis not present

## 2018-09-06 DIAGNOSIS — R42 Dizziness and giddiness: Secondary | ICD-10-CM | POA: Diagnosis not present

## 2018-09-06 DIAGNOSIS — R630 Anorexia: Secondary | ICD-10-CM | POA: Diagnosis not present

## 2018-09-06 DIAGNOSIS — K219 Gastro-esophageal reflux disease without esophagitis: Secondary | ICD-10-CM | POA: Diagnosis not present

## 2018-09-06 DIAGNOSIS — R52 Pain, unspecified: Secondary | ICD-10-CM | POA: Diagnosis not present

## 2018-09-09 DIAGNOSIS — R42 Dizziness and giddiness: Secondary | ICD-10-CM | POA: Diagnosis not present

## 2018-09-09 DIAGNOSIS — C50919 Malignant neoplasm of unspecified site of unspecified female breast: Secondary | ICD-10-CM | POA: Diagnosis not present

## 2018-09-09 DIAGNOSIS — R531 Weakness: Secondary | ICD-10-CM | POA: Diagnosis not present

## 2018-09-09 DIAGNOSIS — K219 Gastro-esophageal reflux disease without esophagitis: Secondary | ICD-10-CM | POA: Diagnosis not present

## 2018-09-09 DIAGNOSIS — R630 Anorexia: Secondary | ICD-10-CM | POA: Diagnosis not present

## 2018-09-09 DIAGNOSIS — R52 Pain, unspecified: Secondary | ICD-10-CM | POA: Diagnosis not present

## 2018-09-13 DIAGNOSIS — R52 Pain, unspecified: Secondary | ICD-10-CM | POA: Diagnosis not present

## 2018-09-13 DIAGNOSIS — C50919 Malignant neoplasm of unspecified site of unspecified female breast: Secondary | ICD-10-CM | POA: Diagnosis not present

## 2018-09-13 DIAGNOSIS — K219 Gastro-esophageal reflux disease without esophagitis: Secondary | ICD-10-CM | POA: Diagnosis not present

## 2018-09-13 DIAGNOSIS — R42 Dizziness and giddiness: Secondary | ICD-10-CM | POA: Diagnosis not present

## 2018-09-13 DIAGNOSIS — R630 Anorexia: Secondary | ICD-10-CM | POA: Diagnosis not present

## 2018-09-13 DIAGNOSIS — R531 Weakness: Secondary | ICD-10-CM | POA: Diagnosis not present

## 2018-09-15 DIAGNOSIS — K219 Gastro-esophageal reflux disease without esophagitis: Secondary | ICD-10-CM | POA: Diagnosis not present

## 2018-09-15 DIAGNOSIS — C50919 Malignant neoplasm of unspecified site of unspecified female breast: Secondary | ICD-10-CM | POA: Diagnosis not present

## 2018-09-15 DIAGNOSIS — R42 Dizziness and giddiness: Secondary | ICD-10-CM | POA: Diagnosis not present

## 2018-09-15 DIAGNOSIS — R630 Anorexia: Secondary | ICD-10-CM | POA: Diagnosis not present

## 2018-09-15 DIAGNOSIS — R531 Weakness: Secondary | ICD-10-CM | POA: Diagnosis not present

## 2018-09-15 DIAGNOSIS — R52 Pain, unspecified: Secondary | ICD-10-CM | POA: Diagnosis not present

## 2018-09-20 DIAGNOSIS — R42 Dizziness and giddiness: Secondary | ICD-10-CM | POA: Diagnosis not present

## 2018-09-20 DIAGNOSIS — K219 Gastro-esophageal reflux disease without esophagitis: Secondary | ICD-10-CM | POA: Diagnosis not present

## 2018-09-20 DIAGNOSIS — C50919 Malignant neoplasm of unspecified site of unspecified female breast: Secondary | ICD-10-CM | POA: Diagnosis not present

## 2018-09-20 DIAGNOSIS — R52 Pain, unspecified: Secondary | ICD-10-CM | POA: Diagnosis not present

## 2018-09-20 DIAGNOSIS — R630 Anorexia: Secondary | ICD-10-CM | POA: Diagnosis not present

## 2018-09-20 DIAGNOSIS — R531 Weakness: Secondary | ICD-10-CM | POA: Diagnosis not present

## 2018-09-26 DIAGNOSIS — C50919 Malignant neoplasm of unspecified site of unspecified female breast: Secondary | ICD-10-CM | POA: Diagnosis not present

## 2018-09-26 DIAGNOSIS — K219 Gastro-esophageal reflux disease without esophagitis: Secondary | ICD-10-CM | POA: Diagnosis not present

## 2018-09-26 DIAGNOSIS — R52 Pain, unspecified: Secondary | ICD-10-CM | POA: Diagnosis not present

## 2018-09-26 DIAGNOSIS — R630 Anorexia: Secondary | ICD-10-CM | POA: Diagnosis not present

## 2018-09-26 DIAGNOSIS — R42 Dizziness and giddiness: Secondary | ICD-10-CM | POA: Diagnosis not present

## 2018-09-26 DIAGNOSIS — R531 Weakness: Secondary | ICD-10-CM | POA: Diagnosis not present

## 2018-09-27 DIAGNOSIS — R52 Pain, unspecified: Secondary | ICD-10-CM | POA: Diagnosis not present

## 2018-09-27 DIAGNOSIS — K219 Gastro-esophageal reflux disease without esophagitis: Secondary | ICD-10-CM | POA: Diagnosis not present

## 2018-09-27 DIAGNOSIS — C50919 Malignant neoplasm of unspecified site of unspecified female breast: Secondary | ICD-10-CM | POA: Diagnosis not present

## 2018-09-27 DIAGNOSIS — R531 Weakness: Secondary | ICD-10-CM | POA: Diagnosis not present

## 2018-09-27 DIAGNOSIS — R42 Dizziness and giddiness: Secondary | ICD-10-CM | POA: Diagnosis not present

## 2018-09-27 DIAGNOSIS — R630 Anorexia: Secondary | ICD-10-CM | POA: Diagnosis not present

## 2018-09-29 DIAGNOSIS — C50919 Malignant neoplasm of unspecified site of unspecified female breast: Secondary | ICD-10-CM | POA: Diagnosis not present

## 2018-09-29 DIAGNOSIS — R42 Dizziness and giddiness: Secondary | ICD-10-CM | POA: Diagnosis not present

## 2018-09-29 DIAGNOSIS — R531 Weakness: Secondary | ICD-10-CM | POA: Diagnosis not present

## 2018-09-29 DIAGNOSIS — K219 Gastro-esophageal reflux disease without esophagitis: Secondary | ICD-10-CM | POA: Diagnosis not present

## 2018-09-29 DIAGNOSIS — R52 Pain, unspecified: Secondary | ICD-10-CM | POA: Diagnosis not present

## 2018-09-29 DIAGNOSIS — R630 Anorexia: Secondary | ICD-10-CM | POA: Diagnosis not present

## 2018-10-04 DIAGNOSIS — R52 Pain, unspecified: Secondary | ICD-10-CM | POA: Diagnosis not present

## 2018-10-04 DIAGNOSIS — R630 Anorexia: Secondary | ICD-10-CM | POA: Diagnosis not present

## 2018-10-04 DIAGNOSIS — K219 Gastro-esophageal reflux disease without esophagitis: Secondary | ICD-10-CM | POA: Diagnosis not present

## 2018-10-04 DIAGNOSIS — R531 Weakness: Secondary | ICD-10-CM | POA: Diagnosis not present

## 2018-10-04 DIAGNOSIS — C50919 Malignant neoplasm of unspecified site of unspecified female breast: Secondary | ICD-10-CM | POA: Diagnosis not present

## 2018-10-04 DIAGNOSIS — R42 Dizziness and giddiness: Secondary | ICD-10-CM | POA: Diagnosis not present

## 2018-10-10 DIAGNOSIS — R42 Dizziness and giddiness: Secondary | ICD-10-CM | POA: Diagnosis not present

## 2018-10-10 DIAGNOSIS — K219 Gastro-esophageal reflux disease without esophagitis: Secondary | ICD-10-CM | POA: Diagnosis not present

## 2018-10-10 DIAGNOSIS — R52 Pain, unspecified: Secondary | ICD-10-CM | POA: Diagnosis not present

## 2018-10-10 DIAGNOSIS — C50919 Malignant neoplasm of unspecified site of unspecified female breast: Secondary | ICD-10-CM | POA: Diagnosis not present

## 2018-10-10 DIAGNOSIS — R630 Anorexia: Secondary | ICD-10-CM | POA: Diagnosis not present

## 2018-10-10 DIAGNOSIS — R531 Weakness: Secondary | ICD-10-CM | POA: Diagnosis not present

## 2018-10-10 DIAGNOSIS — I82B19 Acute embolism and thrombosis of unspecified subclavian vein: Secondary | ICD-10-CM | POA: Diagnosis not present

## 2018-10-11 DIAGNOSIS — R531 Weakness: Secondary | ICD-10-CM | POA: Diagnosis not present

## 2018-10-11 DIAGNOSIS — C50919 Malignant neoplasm of unspecified site of unspecified female breast: Secondary | ICD-10-CM | POA: Diagnosis not present

## 2018-10-11 DIAGNOSIS — R42 Dizziness and giddiness: Secondary | ICD-10-CM | POA: Diagnosis not present

## 2018-10-11 DIAGNOSIS — R630 Anorexia: Secondary | ICD-10-CM | POA: Diagnosis not present

## 2018-10-11 DIAGNOSIS — K219 Gastro-esophageal reflux disease without esophagitis: Secondary | ICD-10-CM | POA: Diagnosis not present

## 2018-10-11 DIAGNOSIS — R52 Pain, unspecified: Secondary | ICD-10-CM | POA: Diagnosis not present

## 2018-10-12 DIAGNOSIS — R42 Dizziness and giddiness: Secondary | ICD-10-CM | POA: Diagnosis not present

## 2018-10-12 DIAGNOSIS — R52 Pain, unspecified: Secondary | ICD-10-CM | POA: Diagnosis not present

## 2018-10-12 DIAGNOSIS — K219 Gastro-esophageal reflux disease without esophagitis: Secondary | ICD-10-CM | POA: Diagnosis not present

## 2018-10-12 DIAGNOSIS — R531 Weakness: Secondary | ICD-10-CM | POA: Diagnosis not present

## 2018-10-12 DIAGNOSIS — R630 Anorexia: Secondary | ICD-10-CM | POA: Diagnosis not present

## 2018-10-12 DIAGNOSIS — C50919 Malignant neoplasm of unspecified site of unspecified female breast: Secondary | ICD-10-CM | POA: Diagnosis not present

## 2018-10-14 ENCOUNTER — Other Ambulatory Visit (HOSPITAL_COMMUNITY): Payer: Self-pay | Admitting: Emergency Medicine

## 2018-10-14 DIAGNOSIS — R531 Weakness: Secondary | ICD-10-CM | POA: Diagnosis not present

## 2018-10-14 DIAGNOSIS — K219 Gastro-esophageal reflux disease without esophagitis: Secondary | ICD-10-CM | POA: Diagnosis not present

## 2018-10-14 DIAGNOSIS — R52 Pain, unspecified: Secondary | ICD-10-CM | POA: Diagnosis not present

## 2018-10-14 DIAGNOSIS — C50919 Malignant neoplasm of unspecified site of unspecified female breast: Secondary | ICD-10-CM

## 2018-10-14 DIAGNOSIS — R42 Dizziness and giddiness: Secondary | ICD-10-CM | POA: Diagnosis not present

## 2018-10-14 DIAGNOSIS — R630 Anorexia: Secondary | ICD-10-CM | POA: Diagnosis not present

## 2018-10-18 DIAGNOSIS — C50919 Malignant neoplasm of unspecified site of unspecified female breast: Secondary | ICD-10-CM | POA: Diagnosis not present

## 2018-10-18 DIAGNOSIS — R630 Anorexia: Secondary | ICD-10-CM | POA: Diagnosis not present

## 2018-10-18 DIAGNOSIS — R42 Dizziness and giddiness: Secondary | ICD-10-CM | POA: Diagnosis not present

## 2018-10-18 DIAGNOSIS — K219 Gastro-esophageal reflux disease without esophagitis: Secondary | ICD-10-CM | POA: Diagnosis not present

## 2018-10-18 DIAGNOSIS — R52 Pain, unspecified: Secondary | ICD-10-CM | POA: Diagnosis not present

## 2018-10-18 DIAGNOSIS — R531 Weakness: Secondary | ICD-10-CM | POA: Diagnosis not present

## 2018-10-21 DIAGNOSIS — R630 Anorexia: Secondary | ICD-10-CM | POA: Diagnosis not present

## 2018-10-21 DIAGNOSIS — R42 Dizziness and giddiness: Secondary | ICD-10-CM | POA: Diagnosis not present

## 2018-10-21 DIAGNOSIS — R52 Pain, unspecified: Secondary | ICD-10-CM | POA: Diagnosis not present

## 2018-10-21 DIAGNOSIS — K219 Gastro-esophageal reflux disease without esophagitis: Secondary | ICD-10-CM | POA: Diagnosis not present

## 2018-10-21 DIAGNOSIS — R531 Weakness: Secondary | ICD-10-CM | POA: Diagnosis not present

## 2018-10-21 DIAGNOSIS — C50919 Malignant neoplasm of unspecified site of unspecified female breast: Secondary | ICD-10-CM | POA: Diagnosis not present

## 2018-10-24 DIAGNOSIS — K219 Gastro-esophageal reflux disease without esophagitis: Secondary | ICD-10-CM | POA: Diagnosis not present

## 2018-10-24 DIAGNOSIS — R531 Weakness: Secondary | ICD-10-CM | POA: Diagnosis not present

## 2018-10-24 DIAGNOSIS — R630 Anorexia: Secondary | ICD-10-CM | POA: Diagnosis not present

## 2018-10-24 DIAGNOSIS — R42 Dizziness and giddiness: Secondary | ICD-10-CM | POA: Diagnosis not present

## 2018-10-24 DIAGNOSIS — R52 Pain, unspecified: Secondary | ICD-10-CM | POA: Diagnosis not present

## 2018-10-24 DIAGNOSIS — C50919 Malignant neoplasm of unspecified site of unspecified female breast: Secondary | ICD-10-CM | POA: Diagnosis not present

## 2018-10-25 DIAGNOSIS — R52 Pain, unspecified: Secondary | ICD-10-CM | POA: Diagnosis not present

## 2018-10-25 DIAGNOSIS — R630 Anorexia: Secondary | ICD-10-CM | POA: Diagnosis not present

## 2018-10-25 DIAGNOSIS — K219 Gastro-esophageal reflux disease without esophagitis: Secondary | ICD-10-CM | POA: Diagnosis not present

## 2018-10-25 DIAGNOSIS — R531 Weakness: Secondary | ICD-10-CM | POA: Diagnosis not present

## 2018-10-25 DIAGNOSIS — R42 Dizziness and giddiness: Secondary | ICD-10-CM | POA: Diagnosis not present

## 2018-10-25 DIAGNOSIS — C50919 Malignant neoplasm of unspecified site of unspecified female breast: Secondary | ICD-10-CM | POA: Diagnosis not present

## 2018-10-26 DIAGNOSIS — R531 Weakness: Secondary | ICD-10-CM | POA: Diagnosis not present

## 2018-10-26 DIAGNOSIS — C50919 Malignant neoplasm of unspecified site of unspecified female breast: Secondary | ICD-10-CM | POA: Diagnosis not present

## 2018-10-26 DIAGNOSIS — R630 Anorexia: Secondary | ICD-10-CM | POA: Diagnosis not present

## 2018-10-26 DIAGNOSIS — R52 Pain, unspecified: Secondary | ICD-10-CM | POA: Diagnosis not present

## 2018-10-26 DIAGNOSIS — K219 Gastro-esophageal reflux disease without esophagitis: Secondary | ICD-10-CM | POA: Diagnosis not present

## 2018-10-26 DIAGNOSIS — R42 Dizziness and giddiness: Secondary | ICD-10-CM | POA: Diagnosis not present

## 2018-10-27 ENCOUNTER — Other Ambulatory Visit (HOSPITAL_COMMUNITY): Payer: Self-pay | Admitting: *Deleted

## 2018-10-27 DIAGNOSIS — C50919 Malignant neoplasm of unspecified site of unspecified female breast: Secondary | ICD-10-CM

## 2018-10-27 MED ORDER — LACTULOSE 10 GM/15ML PO SOLN: 20.0000 g | Freq: Two times a day (BID) | ORAL | 3 refills | Status: AC

## 2018-11-01 DIAGNOSIS — R531 Weakness: Secondary | ICD-10-CM | POA: Diagnosis not present

## 2018-11-01 DIAGNOSIS — R52 Pain, unspecified: Secondary | ICD-10-CM | POA: Diagnosis not present

## 2018-11-01 DIAGNOSIS — C50919 Malignant neoplasm of unspecified site of unspecified female breast: Secondary | ICD-10-CM | POA: Diagnosis not present

## 2018-11-01 DIAGNOSIS — R630 Anorexia: Secondary | ICD-10-CM | POA: Diagnosis not present

## 2018-11-01 DIAGNOSIS — K219 Gastro-esophageal reflux disease without esophagitis: Secondary | ICD-10-CM | POA: Diagnosis not present

## 2018-11-01 DIAGNOSIS — R42 Dizziness and giddiness: Secondary | ICD-10-CM | POA: Diagnosis not present

## 2018-11-03 DIAGNOSIS — R42 Dizziness and giddiness: Secondary | ICD-10-CM | POA: Diagnosis not present

## 2018-11-03 DIAGNOSIS — K219 Gastro-esophageal reflux disease without esophagitis: Secondary | ICD-10-CM | POA: Diagnosis not present

## 2018-11-03 DIAGNOSIS — R52 Pain, unspecified: Secondary | ICD-10-CM | POA: Diagnosis not present

## 2018-11-03 DIAGNOSIS — R531 Weakness: Secondary | ICD-10-CM | POA: Diagnosis not present

## 2018-11-03 DIAGNOSIS — C50919 Malignant neoplasm of unspecified site of unspecified female breast: Secondary | ICD-10-CM | POA: Diagnosis not present

## 2018-11-03 DIAGNOSIS — R630 Anorexia: Secondary | ICD-10-CM | POA: Diagnosis not present

## 2018-11-04 DIAGNOSIS — K219 Gastro-esophageal reflux disease without esophagitis: Secondary | ICD-10-CM | POA: Diagnosis not present

## 2018-11-04 DIAGNOSIS — R630 Anorexia: Secondary | ICD-10-CM | POA: Diagnosis not present

## 2018-11-04 DIAGNOSIS — R52 Pain, unspecified: Secondary | ICD-10-CM | POA: Diagnosis not present

## 2018-11-04 DIAGNOSIS — R531 Weakness: Secondary | ICD-10-CM | POA: Diagnosis not present

## 2018-11-04 DIAGNOSIS — R42 Dizziness and giddiness: Secondary | ICD-10-CM | POA: Diagnosis not present

## 2018-11-04 DIAGNOSIS — C50919 Malignant neoplasm of unspecified site of unspecified female breast: Secondary | ICD-10-CM | POA: Diagnosis not present

## 2018-11-07 DIAGNOSIS — R42 Dizziness and giddiness: Secondary | ICD-10-CM | POA: Diagnosis not present

## 2018-11-07 DIAGNOSIS — R52 Pain, unspecified: Secondary | ICD-10-CM | POA: Diagnosis not present

## 2018-11-07 DIAGNOSIS — R630 Anorexia: Secondary | ICD-10-CM | POA: Diagnosis not present

## 2018-11-07 DIAGNOSIS — R531 Weakness: Secondary | ICD-10-CM | POA: Diagnosis not present

## 2018-11-07 DIAGNOSIS — K219 Gastro-esophageal reflux disease without esophagitis: Secondary | ICD-10-CM | POA: Diagnosis not present

## 2018-11-07 DIAGNOSIS — C50919 Malignant neoplasm of unspecified site of unspecified female breast: Secondary | ICD-10-CM | POA: Diagnosis not present

## 2018-11-08 DIAGNOSIS — K219 Gastro-esophageal reflux disease without esophagitis: Secondary | ICD-10-CM | POA: Diagnosis not present

## 2018-11-08 DIAGNOSIS — R42 Dizziness and giddiness: Secondary | ICD-10-CM | POA: Diagnosis not present

## 2018-11-08 DIAGNOSIS — R630 Anorexia: Secondary | ICD-10-CM | POA: Diagnosis not present

## 2018-11-08 DIAGNOSIS — R52 Pain, unspecified: Secondary | ICD-10-CM | POA: Diagnosis not present

## 2018-11-08 DIAGNOSIS — C50919 Malignant neoplasm of unspecified site of unspecified female breast: Secondary | ICD-10-CM | POA: Diagnosis not present

## 2018-11-08 DIAGNOSIS — R531 Weakness: Secondary | ICD-10-CM | POA: Diagnosis not present

## 2018-11-09 DIAGNOSIS — C50919 Malignant neoplasm of unspecified site of unspecified female breast: Secondary | ICD-10-CM | POA: Diagnosis not present

## 2018-11-09 DIAGNOSIS — R531 Weakness: Secondary | ICD-10-CM | POA: Diagnosis not present

## 2018-11-09 DIAGNOSIS — Z66 Do not resuscitate: Secondary | ICD-10-CM | POA: Diagnosis not present

## 2018-11-09 DIAGNOSIS — R52 Pain, unspecified: Secondary | ICD-10-CM | POA: Diagnosis not present

## 2018-11-09 DIAGNOSIS — K219 Gastro-esophageal reflux disease without esophagitis: Secondary | ICD-10-CM | POA: Diagnosis not present

## 2018-11-09 DIAGNOSIS — L299 Pruritus, unspecified: Secondary | ICD-10-CM | POA: Diagnosis not present

## 2018-11-09 DIAGNOSIS — R42 Dizziness and giddiness: Secondary | ICD-10-CM | POA: Diagnosis not present

## 2018-11-09 DIAGNOSIS — R630 Anorexia: Secondary | ICD-10-CM | POA: Diagnosis not present

## 2018-11-09 DIAGNOSIS — R682 Dry mouth, unspecified: Secondary | ICD-10-CM | POA: Diagnosis not present

## 2018-11-09 DIAGNOSIS — F419 Anxiety disorder, unspecified: Secondary | ICD-10-CM | POA: Diagnosis not present

## 2018-11-09 DIAGNOSIS — I82B19 Acute embolism and thrombosis of unspecified subclavian vein: Secondary | ICD-10-CM | POA: Diagnosis not present

## 2018-11-09 DIAGNOSIS — R110 Nausea: Secondary | ICD-10-CM | POA: Diagnosis not present

## 2018-11-14 DIAGNOSIS — R630 Anorexia: Secondary | ICD-10-CM | POA: Diagnosis not present

## 2018-11-14 DIAGNOSIS — C50919 Malignant neoplasm of unspecified site of unspecified female breast: Secondary | ICD-10-CM | POA: Diagnosis not present

## 2018-11-14 DIAGNOSIS — K219 Gastro-esophageal reflux disease without esophagitis: Secondary | ICD-10-CM | POA: Diagnosis not present

## 2018-11-14 DIAGNOSIS — R42 Dizziness and giddiness: Secondary | ICD-10-CM | POA: Diagnosis not present

## 2018-11-14 DIAGNOSIS — R531 Weakness: Secondary | ICD-10-CM | POA: Diagnosis not present

## 2018-11-14 DIAGNOSIS — R52 Pain, unspecified: Secondary | ICD-10-CM | POA: Diagnosis not present

## 2018-11-21 DIAGNOSIS — C50919 Malignant neoplasm of unspecified site of unspecified female breast: Secondary | ICD-10-CM | POA: Diagnosis not present

## 2018-11-21 DIAGNOSIS — R52 Pain, unspecified: Secondary | ICD-10-CM | POA: Diagnosis not present

## 2018-11-21 DIAGNOSIS — R42 Dizziness and giddiness: Secondary | ICD-10-CM | POA: Diagnosis not present

## 2018-11-21 DIAGNOSIS — R531 Weakness: Secondary | ICD-10-CM | POA: Diagnosis not present

## 2018-11-21 DIAGNOSIS — K219 Gastro-esophageal reflux disease without esophagitis: Secondary | ICD-10-CM | POA: Diagnosis not present

## 2018-11-21 DIAGNOSIS — R630 Anorexia: Secondary | ICD-10-CM | POA: Diagnosis not present

## 2018-11-22 DIAGNOSIS — R52 Pain, unspecified: Secondary | ICD-10-CM | POA: Diagnosis not present

## 2018-11-22 DIAGNOSIS — K219 Gastro-esophageal reflux disease without esophagitis: Secondary | ICD-10-CM | POA: Diagnosis not present

## 2018-11-22 DIAGNOSIS — R531 Weakness: Secondary | ICD-10-CM | POA: Diagnosis not present

## 2018-11-22 DIAGNOSIS — R42 Dizziness and giddiness: Secondary | ICD-10-CM | POA: Diagnosis not present

## 2018-11-22 DIAGNOSIS — R630 Anorexia: Secondary | ICD-10-CM | POA: Diagnosis not present

## 2018-11-22 DIAGNOSIS — C50919 Malignant neoplasm of unspecified site of unspecified female breast: Secondary | ICD-10-CM | POA: Diagnosis not present

## 2018-11-25 DIAGNOSIS — K219 Gastro-esophageal reflux disease without esophagitis: Secondary | ICD-10-CM | POA: Diagnosis not present

## 2018-11-25 DIAGNOSIS — R630 Anorexia: Secondary | ICD-10-CM | POA: Diagnosis not present

## 2018-11-25 DIAGNOSIS — R531 Weakness: Secondary | ICD-10-CM | POA: Diagnosis not present

## 2018-11-25 DIAGNOSIS — R52 Pain, unspecified: Secondary | ICD-10-CM | POA: Diagnosis not present

## 2018-11-25 DIAGNOSIS — C50919 Malignant neoplasm of unspecified site of unspecified female breast: Secondary | ICD-10-CM | POA: Diagnosis not present

## 2018-11-25 DIAGNOSIS — R42 Dizziness and giddiness: Secondary | ICD-10-CM | POA: Diagnosis not present

## 2018-11-28 DIAGNOSIS — R52 Pain, unspecified: Secondary | ICD-10-CM | POA: Diagnosis not present

## 2018-11-28 DIAGNOSIS — R630 Anorexia: Secondary | ICD-10-CM | POA: Diagnosis not present

## 2018-11-28 DIAGNOSIS — K219 Gastro-esophageal reflux disease without esophagitis: Secondary | ICD-10-CM | POA: Diagnosis not present

## 2018-11-28 DIAGNOSIS — R531 Weakness: Secondary | ICD-10-CM | POA: Diagnosis not present

## 2018-11-28 DIAGNOSIS — C50919 Malignant neoplasm of unspecified site of unspecified female breast: Secondary | ICD-10-CM | POA: Diagnosis not present

## 2018-11-28 DIAGNOSIS — R42 Dizziness and giddiness: Secondary | ICD-10-CM | POA: Diagnosis not present

## 2018-11-29 DIAGNOSIS — R52 Pain, unspecified: Secondary | ICD-10-CM | POA: Diagnosis not present

## 2018-11-29 DIAGNOSIS — K219 Gastro-esophageal reflux disease without esophagitis: Secondary | ICD-10-CM | POA: Diagnosis not present

## 2018-11-29 DIAGNOSIS — R531 Weakness: Secondary | ICD-10-CM | POA: Diagnosis not present

## 2018-11-29 DIAGNOSIS — R42 Dizziness and giddiness: Secondary | ICD-10-CM | POA: Diagnosis not present

## 2018-11-29 DIAGNOSIS — C50919 Malignant neoplasm of unspecified site of unspecified female breast: Secondary | ICD-10-CM | POA: Diagnosis not present

## 2018-11-29 DIAGNOSIS — R630 Anorexia: Secondary | ICD-10-CM | POA: Diagnosis not present

## 2018-12-02 DIAGNOSIS — R52 Pain, unspecified: Secondary | ICD-10-CM | POA: Diagnosis not present

## 2018-12-02 DIAGNOSIS — R630 Anorexia: Secondary | ICD-10-CM | POA: Diagnosis not present

## 2018-12-02 DIAGNOSIS — R531 Weakness: Secondary | ICD-10-CM | POA: Diagnosis not present

## 2018-12-02 DIAGNOSIS — K219 Gastro-esophageal reflux disease without esophagitis: Secondary | ICD-10-CM | POA: Diagnosis not present

## 2018-12-02 DIAGNOSIS — R42 Dizziness and giddiness: Secondary | ICD-10-CM | POA: Diagnosis not present

## 2018-12-02 DIAGNOSIS — C50919 Malignant neoplasm of unspecified site of unspecified female breast: Secondary | ICD-10-CM | POA: Diagnosis not present

## 2018-12-05 DIAGNOSIS — R52 Pain, unspecified: Secondary | ICD-10-CM | POA: Diagnosis not present

## 2018-12-05 DIAGNOSIS — R630 Anorexia: Secondary | ICD-10-CM | POA: Diagnosis not present

## 2018-12-05 DIAGNOSIS — R42 Dizziness and giddiness: Secondary | ICD-10-CM | POA: Diagnosis not present

## 2018-12-05 DIAGNOSIS — C50919 Malignant neoplasm of unspecified site of unspecified female breast: Secondary | ICD-10-CM | POA: Diagnosis not present

## 2018-12-05 DIAGNOSIS — K219 Gastro-esophageal reflux disease without esophagitis: Secondary | ICD-10-CM | POA: Diagnosis not present

## 2018-12-05 DIAGNOSIS — R531 Weakness: Secondary | ICD-10-CM | POA: Diagnosis not present

## 2018-12-06 DIAGNOSIS — R52 Pain, unspecified: Secondary | ICD-10-CM | POA: Diagnosis not present

## 2018-12-06 DIAGNOSIS — R42 Dizziness and giddiness: Secondary | ICD-10-CM | POA: Diagnosis not present

## 2018-12-06 DIAGNOSIS — K219 Gastro-esophageal reflux disease without esophagitis: Secondary | ICD-10-CM | POA: Diagnosis not present

## 2018-12-06 DIAGNOSIS — C50919 Malignant neoplasm of unspecified site of unspecified female breast: Secondary | ICD-10-CM | POA: Diagnosis not present

## 2018-12-06 DIAGNOSIS — R531 Weakness: Secondary | ICD-10-CM | POA: Diagnosis not present

## 2018-12-06 DIAGNOSIS — R630 Anorexia: Secondary | ICD-10-CM | POA: Diagnosis not present

## 2018-12-07 DIAGNOSIS — K219 Gastro-esophageal reflux disease without esophagitis: Secondary | ICD-10-CM | POA: Diagnosis not present

## 2018-12-07 DIAGNOSIS — C50919 Malignant neoplasm of unspecified site of unspecified female breast: Secondary | ICD-10-CM | POA: Diagnosis not present

## 2018-12-07 DIAGNOSIS — R630 Anorexia: Secondary | ICD-10-CM | POA: Diagnosis not present

## 2018-12-07 DIAGNOSIS — R531 Weakness: Secondary | ICD-10-CM | POA: Diagnosis not present

## 2018-12-07 DIAGNOSIS — R42 Dizziness and giddiness: Secondary | ICD-10-CM | POA: Diagnosis not present

## 2018-12-07 DIAGNOSIS — R52 Pain, unspecified: Secondary | ICD-10-CM | POA: Diagnosis not present

## 2018-12-08 DIAGNOSIS — C50919 Malignant neoplasm of unspecified site of unspecified female breast: Secondary | ICD-10-CM | POA: Diagnosis not present

## 2018-12-08 DIAGNOSIS — R630 Anorexia: Secondary | ICD-10-CM | POA: Diagnosis not present

## 2018-12-08 DIAGNOSIS — R42 Dizziness and giddiness: Secondary | ICD-10-CM | POA: Diagnosis not present

## 2018-12-08 DIAGNOSIS — K219 Gastro-esophageal reflux disease without esophagitis: Secondary | ICD-10-CM | POA: Diagnosis not present

## 2018-12-08 DIAGNOSIS — R52 Pain, unspecified: Secondary | ICD-10-CM | POA: Diagnosis not present

## 2018-12-08 DIAGNOSIS — R531 Weakness: Secondary | ICD-10-CM | POA: Diagnosis not present

## 2018-12-09 DIAGNOSIS — R630 Anorexia: Secondary | ICD-10-CM | POA: Diagnosis not present

## 2018-12-09 DIAGNOSIS — C50919 Malignant neoplasm of unspecified site of unspecified female breast: Secondary | ICD-10-CM | POA: Diagnosis not present

## 2018-12-09 DIAGNOSIS — K219 Gastro-esophageal reflux disease without esophagitis: Secondary | ICD-10-CM | POA: Diagnosis not present

## 2018-12-09 DIAGNOSIS — R52 Pain, unspecified: Secondary | ICD-10-CM | POA: Diagnosis not present

## 2018-12-09 DIAGNOSIS — R42 Dizziness and giddiness: Secondary | ICD-10-CM | POA: Diagnosis not present

## 2018-12-09 DIAGNOSIS — R531 Weakness: Secondary | ICD-10-CM | POA: Diagnosis not present

## 2018-12-10 DIAGNOSIS — C50919 Malignant neoplasm of unspecified site of unspecified female breast: Secondary | ICD-10-CM | POA: Diagnosis not present

## 2018-12-10 DIAGNOSIS — K219 Gastro-esophageal reflux disease without esophagitis: Secondary | ICD-10-CM | POA: Diagnosis not present

## 2018-12-10 DIAGNOSIS — R42 Dizziness and giddiness: Secondary | ICD-10-CM | POA: Diagnosis not present

## 2018-12-10 DIAGNOSIS — R531 Weakness: Secondary | ICD-10-CM | POA: Diagnosis not present

## 2018-12-10 DIAGNOSIS — L299 Pruritus, unspecified: Secondary | ICD-10-CM | POA: Diagnosis not present

## 2018-12-10 DIAGNOSIS — R682 Dry mouth, unspecified: Secondary | ICD-10-CM | POA: Diagnosis not present

## 2018-12-10 DIAGNOSIS — R110 Nausea: Secondary | ICD-10-CM | POA: Diagnosis not present

## 2018-12-10 DIAGNOSIS — Z66 Do not resuscitate: Secondary | ICD-10-CM | POA: Diagnosis not present

## 2018-12-10 DIAGNOSIS — I82B19 Acute embolism and thrombosis of unspecified subclavian vein: Secondary | ICD-10-CM | POA: Diagnosis not present

## 2018-12-10 DIAGNOSIS — F419 Anxiety disorder, unspecified: Secondary | ICD-10-CM | POA: Diagnosis not present

## 2018-12-10 DIAGNOSIS — R630 Anorexia: Secondary | ICD-10-CM | POA: Diagnosis not present

## 2018-12-10 DIAGNOSIS — R52 Pain, unspecified: Secondary | ICD-10-CM | POA: Diagnosis not present

## 2018-12-11 DIAGNOSIS — R52 Pain, unspecified: Secondary | ICD-10-CM | POA: Diagnosis not present

## 2018-12-11 DIAGNOSIS — R531 Weakness: Secondary | ICD-10-CM | POA: Diagnosis not present

## 2018-12-11 DIAGNOSIS — R630 Anorexia: Secondary | ICD-10-CM | POA: Diagnosis not present

## 2018-12-11 DIAGNOSIS — R42 Dizziness and giddiness: Secondary | ICD-10-CM | POA: Diagnosis not present

## 2018-12-11 DIAGNOSIS — C50919 Malignant neoplasm of unspecified site of unspecified female breast: Secondary | ICD-10-CM | POA: Diagnosis not present

## 2018-12-11 DIAGNOSIS — K219 Gastro-esophageal reflux disease without esophagitis: Secondary | ICD-10-CM | POA: Diagnosis not present

## 2018-12-12 DIAGNOSIS — R42 Dizziness and giddiness: Secondary | ICD-10-CM | POA: Diagnosis not present

## 2018-12-12 DIAGNOSIS — R531 Weakness: Secondary | ICD-10-CM | POA: Diagnosis not present

## 2018-12-12 DIAGNOSIS — K219 Gastro-esophageal reflux disease without esophagitis: Secondary | ICD-10-CM | POA: Diagnosis not present

## 2018-12-12 DIAGNOSIS — C50919 Malignant neoplasm of unspecified site of unspecified female breast: Secondary | ICD-10-CM | POA: Diagnosis not present

## 2018-12-12 DIAGNOSIS — R52 Pain, unspecified: Secondary | ICD-10-CM | POA: Diagnosis not present

## 2018-12-12 DIAGNOSIS — R630 Anorexia: Secondary | ICD-10-CM | POA: Diagnosis not present

## 2018-12-13 DIAGNOSIS — C50919 Malignant neoplasm of unspecified site of unspecified female breast: Secondary | ICD-10-CM | POA: Diagnosis not present

## 2018-12-13 DIAGNOSIS — R630 Anorexia: Secondary | ICD-10-CM | POA: Diagnosis not present

## 2018-12-13 DIAGNOSIS — R52 Pain, unspecified: Secondary | ICD-10-CM | POA: Diagnosis not present

## 2018-12-13 DIAGNOSIS — R531 Weakness: Secondary | ICD-10-CM | POA: Diagnosis not present

## 2018-12-13 DIAGNOSIS — K219 Gastro-esophageal reflux disease without esophagitis: Secondary | ICD-10-CM | POA: Diagnosis not present

## 2018-12-13 DIAGNOSIS — R42 Dizziness and giddiness: Secondary | ICD-10-CM | POA: Diagnosis not present

## 2018-12-14 DIAGNOSIS — R531 Weakness: Secondary | ICD-10-CM | POA: Diagnosis not present

## 2018-12-14 DIAGNOSIS — K219 Gastro-esophageal reflux disease without esophagitis: Secondary | ICD-10-CM | POA: Diagnosis not present

## 2018-12-14 DIAGNOSIS — C50919 Malignant neoplasm of unspecified site of unspecified female breast: Secondary | ICD-10-CM | POA: Diagnosis not present

## 2018-12-14 DIAGNOSIS — R630 Anorexia: Secondary | ICD-10-CM | POA: Diagnosis not present

## 2018-12-14 DIAGNOSIS — R52 Pain, unspecified: Secondary | ICD-10-CM | POA: Diagnosis not present

## 2018-12-14 DIAGNOSIS — R42 Dizziness and giddiness: Secondary | ICD-10-CM | POA: Diagnosis not present

## 2018-12-15 DIAGNOSIS — R52 Pain, unspecified: Secondary | ICD-10-CM | POA: Diagnosis not present

## 2018-12-15 DIAGNOSIS — R630 Anorexia: Secondary | ICD-10-CM | POA: Diagnosis not present

## 2018-12-15 DIAGNOSIS — R42 Dizziness and giddiness: Secondary | ICD-10-CM | POA: Diagnosis not present

## 2018-12-15 DIAGNOSIS — R531 Weakness: Secondary | ICD-10-CM | POA: Diagnosis not present

## 2018-12-15 DIAGNOSIS — C50919 Malignant neoplasm of unspecified site of unspecified female breast: Secondary | ICD-10-CM | POA: Diagnosis not present

## 2018-12-15 DIAGNOSIS — K219 Gastro-esophageal reflux disease without esophagitis: Secondary | ICD-10-CM | POA: Diagnosis not present

## 2018-12-16 DIAGNOSIS — R42 Dizziness and giddiness: Secondary | ICD-10-CM | POA: Diagnosis not present

## 2018-12-16 DIAGNOSIS — C50919 Malignant neoplasm of unspecified site of unspecified female breast: Secondary | ICD-10-CM | POA: Diagnosis not present

## 2018-12-16 DIAGNOSIS — K219 Gastro-esophageal reflux disease without esophagitis: Secondary | ICD-10-CM | POA: Diagnosis not present

## 2018-12-16 DIAGNOSIS — R531 Weakness: Secondary | ICD-10-CM | POA: Diagnosis not present

## 2018-12-16 DIAGNOSIS — R52 Pain, unspecified: Secondary | ICD-10-CM | POA: Diagnosis not present

## 2018-12-16 DIAGNOSIS — R630 Anorexia: Secondary | ICD-10-CM | POA: Diagnosis not present

## 2018-12-17 DIAGNOSIS — R42 Dizziness and giddiness: Secondary | ICD-10-CM | POA: Diagnosis not present

## 2018-12-17 DIAGNOSIS — C50919 Malignant neoplasm of unspecified site of unspecified female breast: Secondary | ICD-10-CM | POA: Diagnosis not present

## 2018-12-17 DIAGNOSIS — R52 Pain, unspecified: Secondary | ICD-10-CM | POA: Diagnosis not present

## 2018-12-17 DIAGNOSIS — K219 Gastro-esophageal reflux disease without esophagitis: Secondary | ICD-10-CM | POA: Diagnosis not present

## 2018-12-17 DIAGNOSIS — R630 Anorexia: Secondary | ICD-10-CM | POA: Diagnosis not present

## 2018-12-17 DIAGNOSIS — R531 Weakness: Secondary | ICD-10-CM | POA: Diagnosis not present

## 2018-12-18 DIAGNOSIS — R531 Weakness: Secondary | ICD-10-CM | POA: Diagnosis not present

## 2018-12-18 DIAGNOSIS — R42 Dizziness and giddiness: Secondary | ICD-10-CM | POA: Diagnosis not present

## 2018-12-18 DIAGNOSIS — R630 Anorexia: Secondary | ICD-10-CM | POA: Diagnosis not present

## 2018-12-18 DIAGNOSIS — R52 Pain, unspecified: Secondary | ICD-10-CM | POA: Diagnosis not present

## 2018-12-18 DIAGNOSIS — K219 Gastro-esophageal reflux disease without esophagitis: Secondary | ICD-10-CM | POA: Diagnosis not present

## 2018-12-18 DIAGNOSIS — C50919 Malignant neoplasm of unspecified site of unspecified female breast: Secondary | ICD-10-CM | POA: Diagnosis not present

## 2018-12-19 DIAGNOSIS — C50919 Malignant neoplasm of unspecified site of unspecified female breast: Secondary | ICD-10-CM | POA: Diagnosis not present

## 2018-12-19 DIAGNOSIS — R630 Anorexia: Secondary | ICD-10-CM | POA: Diagnosis not present

## 2018-12-19 DIAGNOSIS — R52 Pain, unspecified: Secondary | ICD-10-CM | POA: Diagnosis not present

## 2018-12-19 DIAGNOSIS — R531 Weakness: Secondary | ICD-10-CM | POA: Diagnosis not present

## 2018-12-19 DIAGNOSIS — K219 Gastro-esophageal reflux disease without esophagitis: Secondary | ICD-10-CM | POA: Diagnosis not present

## 2018-12-19 DIAGNOSIS — R42 Dizziness and giddiness: Secondary | ICD-10-CM | POA: Diagnosis not present

## 2018-12-20 DIAGNOSIS — R630 Anorexia: Secondary | ICD-10-CM | POA: Diagnosis not present

## 2018-12-20 DIAGNOSIS — R42 Dizziness and giddiness: Secondary | ICD-10-CM | POA: Diagnosis not present

## 2018-12-20 DIAGNOSIS — K219 Gastro-esophageal reflux disease without esophagitis: Secondary | ICD-10-CM | POA: Diagnosis not present

## 2018-12-20 DIAGNOSIS — C50919 Malignant neoplasm of unspecified site of unspecified female breast: Secondary | ICD-10-CM | POA: Diagnosis not present

## 2018-12-20 DIAGNOSIS — R52 Pain, unspecified: Secondary | ICD-10-CM | POA: Diagnosis not present

## 2018-12-20 DIAGNOSIS — R531 Weakness: Secondary | ICD-10-CM | POA: Diagnosis not present

## 2018-12-21 DIAGNOSIS — R42 Dizziness and giddiness: Secondary | ICD-10-CM | POA: Diagnosis not present

## 2018-12-21 DIAGNOSIS — K219 Gastro-esophageal reflux disease without esophagitis: Secondary | ICD-10-CM | POA: Diagnosis not present

## 2018-12-21 DIAGNOSIS — R52 Pain, unspecified: Secondary | ICD-10-CM | POA: Diagnosis not present

## 2018-12-21 DIAGNOSIS — C50919 Malignant neoplasm of unspecified site of unspecified female breast: Secondary | ICD-10-CM | POA: Diagnosis not present

## 2018-12-21 DIAGNOSIS — R531 Weakness: Secondary | ICD-10-CM | POA: Diagnosis not present

## 2018-12-21 DIAGNOSIS — R630 Anorexia: Secondary | ICD-10-CM | POA: Diagnosis not present

## 2018-12-22 DIAGNOSIS — C50919 Malignant neoplasm of unspecified site of unspecified female breast: Secondary | ICD-10-CM | POA: Diagnosis not present

## 2018-12-22 DIAGNOSIS — R531 Weakness: Secondary | ICD-10-CM | POA: Diagnosis not present

## 2018-12-22 DIAGNOSIS — R42 Dizziness and giddiness: Secondary | ICD-10-CM | POA: Diagnosis not present

## 2018-12-22 DIAGNOSIS — R630 Anorexia: Secondary | ICD-10-CM | POA: Diagnosis not present

## 2018-12-22 DIAGNOSIS — K219 Gastro-esophageal reflux disease without esophagitis: Secondary | ICD-10-CM | POA: Diagnosis not present

## 2018-12-22 DIAGNOSIS — R52 Pain, unspecified: Secondary | ICD-10-CM | POA: Diagnosis not present

## 2018-12-23 DIAGNOSIS — K219 Gastro-esophageal reflux disease without esophagitis: Secondary | ICD-10-CM | POA: Diagnosis not present

## 2018-12-23 DIAGNOSIS — C50919 Malignant neoplasm of unspecified site of unspecified female breast: Secondary | ICD-10-CM | POA: Diagnosis not present

## 2018-12-23 DIAGNOSIS — R630 Anorexia: Secondary | ICD-10-CM | POA: Diagnosis not present

## 2018-12-23 DIAGNOSIS — R42 Dizziness and giddiness: Secondary | ICD-10-CM | POA: Diagnosis not present

## 2018-12-23 DIAGNOSIS — R52 Pain, unspecified: Secondary | ICD-10-CM | POA: Diagnosis not present

## 2018-12-23 DIAGNOSIS — R531 Weakness: Secondary | ICD-10-CM | POA: Diagnosis not present

## 2018-12-24 DIAGNOSIS — C50919 Malignant neoplasm of unspecified site of unspecified female breast: Secondary | ICD-10-CM | POA: Diagnosis not present

## 2018-12-24 DIAGNOSIS — K219 Gastro-esophageal reflux disease without esophagitis: Secondary | ICD-10-CM | POA: Diagnosis not present

## 2018-12-24 DIAGNOSIS — R52 Pain, unspecified: Secondary | ICD-10-CM | POA: Diagnosis not present

## 2018-12-24 DIAGNOSIS — R630 Anorexia: Secondary | ICD-10-CM | POA: Diagnosis not present

## 2018-12-24 DIAGNOSIS — R42 Dizziness and giddiness: Secondary | ICD-10-CM | POA: Diagnosis not present

## 2018-12-24 DIAGNOSIS — R531 Weakness: Secondary | ICD-10-CM | POA: Diagnosis not present

## 2018-12-25 DIAGNOSIS — R630 Anorexia: Secondary | ICD-10-CM | POA: Diagnosis not present

## 2018-12-25 DIAGNOSIS — R42 Dizziness and giddiness: Secondary | ICD-10-CM | POA: Diagnosis not present

## 2018-12-25 DIAGNOSIS — K219 Gastro-esophageal reflux disease without esophagitis: Secondary | ICD-10-CM | POA: Diagnosis not present

## 2018-12-25 DIAGNOSIS — R531 Weakness: Secondary | ICD-10-CM | POA: Diagnosis not present

## 2018-12-25 DIAGNOSIS — R52 Pain, unspecified: Secondary | ICD-10-CM | POA: Diagnosis not present

## 2018-12-25 DIAGNOSIS — C50919 Malignant neoplasm of unspecified site of unspecified female breast: Secondary | ICD-10-CM | POA: Diagnosis not present

## 2018-12-26 DIAGNOSIS — R630 Anorexia: Secondary | ICD-10-CM | POA: Diagnosis not present

## 2018-12-26 DIAGNOSIS — R42 Dizziness and giddiness: Secondary | ICD-10-CM | POA: Diagnosis not present

## 2018-12-26 DIAGNOSIS — C50919 Malignant neoplasm of unspecified site of unspecified female breast: Secondary | ICD-10-CM | POA: Diagnosis not present

## 2018-12-26 DIAGNOSIS — R531 Weakness: Secondary | ICD-10-CM | POA: Diagnosis not present

## 2018-12-26 DIAGNOSIS — R52 Pain, unspecified: Secondary | ICD-10-CM | POA: Diagnosis not present

## 2018-12-26 DIAGNOSIS — K219 Gastro-esophageal reflux disease without esophagitis: Secondary | ICD-10-CM | POA: Diagnosis not present

## 2018-12-27 DIAGNOSIS — R52 Pain, unspecified: Secondary | ICD-10-CM | POA: Diagnosis not present

## 2018-12-27 DIAGNOSIS — R42 Dizziness and giddiness: Secondary | ICD-10-CM | POA: Diagnosis not present

## 2018-12-27 DIAGNOSIS — C50919 Malignant neoplasm of unspecified site of unspecified female breast: Secondary | ICD-10-CM | POA: Diagnosis not present

## 2018-12-27 DIAGNOSIS — K219 Gastro-esophageal reflux disease without esophagitis: Secondary | ICD-10-CM | POA: Diagnosis not present

## 2018-12-27 DIAGNOSIS — R531 Weakness: Secondary | ICD-10-CM | POA: Diagnosis not present

## 2018-12-27 DIAGNOSIS — R630 Anorexia: Secondary | ICD-10-CM | POA: Diagnosis not present

## 2018-12-28 DIAGNOSIS — R42 Dizziness and giddiness: Secondary | ICD-10-CM | POA: Diagnosis not present

## 2018-12-28 DIAGNOSIS — R630 Anorexia: Secondary | ICD-10-CM | POA: Diagnosis not present

## 2018-12-28 DIAGNOSIS — C50919 Malignant neoplasm of unspecified site of unspecified female breast: Secondary | ICD-10-CM | POA: Diagnosis not present

## 2018-12-28 DIAGNOSIS — R531 Weakness: Secondary | ICD-10-CM | POA: Diagnosis not present

## 2018-12-28 DIAGNOSIS — R52 Pain, unspecified: Secondary | ICD-10-CM | POA: Diagnosis not present

## 2018-12-28 DIAGNOSIS — K219 Gastro-esophageal reflux disease without esophagitis: Secondary | ICD-10-CM | POA: Diagnosis not present

## 2018-12-29 DIAGNOSIS — R52 Pain, unspecified: Secondary | ICD-10-CM | POA: Diagnosis not present

## 2018-12-29 DIAGNOSIS — R630 Anorexia: Secondary | ICD-10-CM | POA: Diagnosis not present

## 2018-12-29 DIAGNOSIS — R531 Weakness: Secondary | ICD-10-CM | POA: Diagnosis not present

## 2018-12-29 DIAGNOSIS — R42 Dizziness and giddiness: Secondary | ICD-10-CM | POA: Diagnosis not present

## 2018-12-29 DIAGNOSIS — C50919 Malignant neoplasm of unspecified site of unspecified female breast: Secondary | ICD-10-CM | POA: Diagnosis not present

## 2018-12-29 DIAGNOSIS — K219 Gastro-esophageal reflux disease without esophagitis: Secondary | ICD-10-CM | POA: Diagnosis not present

## 2018-12-30 DIAGNOSIS — R630 Anorexia: Secondary | ICD-10-CM | POA: Diagnosis not present

## 2018-12-30 DIAGNOSIS — C50919 Malignant neoplasm of unspecified site of unspecified female breast: Secondary | ICD-10-CM | POA: Diagnosis not present

## 2018-12-30 DIAGNOSIS — R42 Dizziness and giddiness: Secondary | ICD-10-CM | POA: Diagnosis not present

## 2018-12-30 DIAGNOSIS — K219 Gastro-esophageal reflux disease without esophagitis: Secondary | ICD-10-CM | POA: Diagnosis not present

## 2018-12-30 DIAGNOSIS — R52 Pain, unspecified: Secondary | ICD-10-CM | POA: Diagnosis not present

## 2018-12-30 DIAGNOSIS — R531 Weakness: Secondary | ICD-10-CM | POA: Diagnosis not present

## 2018-12-31 DIAGNOSIS — R531 Weakness: Secondary | ICD-10-CM | POA: Diagnosis not present

## 2018-12-31 DIAGNOSIS — C50919 Malignant neoplasm of unspecified site of unspecified female breast: Secondary | ICD-10-CM | POA: Diagnosis not present

## 2018-12-31 DIAGNOSIS — R42 Dizziness and giddiness: Secondary | ICD-10-CM | POA: Diagnosis not present

## 2018-12-31 DIAGNOSIS — R630 Anorexia: Secondary | ICD-10-CM | POA: Diagnosis not present

## 2018-12-31 DIAGNOSIS — K219 Gastro-esophageal reflux disease without esophagitis: Secondary | ICD-10-CM | POA: Diagnosis not present

## 2018-12-31 DIAGNOSIS — R52 Pain, unspecified: Secondary | ICD-10-CM | POA: Diagnosis not present

## 2019-01-01 DIAGNOSIS — R531 Weakness: Secondary | ICD-10-CM | POA: Diagnosis not present

## 2019-01-01 DIAGNOSIS — K219 Gastro-esophageal reflux disease without esophagitis: Secondary | ICD-10-CM | POA: Diagnosis not present

## 2019-01-01 DIAGNOSIS — R52 Pain, unspecified: Secondary | ICD-10-CM | POA: Diagnosis not present

## 2019-01-01 DIAGNOSIS — C50919 Malignant neoplasm of unspecified site of unspecified female breast: Secondary | ICD-10-CM | POA: Diagnosis not present

## 2019-01-01 DIAGNOSIS — R42 Dizziness and giddiness: Secondary | ICD-10-CM | POA: Diagnosis not present

## 2019-01-01 DIAGNOSIS — R630 Anorexia: Secondary | ICD-10-CM | POA: Diagnosis not present

## 2019-01-02 DIAGNOSIS — R531 Weakness: Secondary | ICD-10-CM | POA: Diagnosis not present

## 2019-01-02 DIAGNOSIS — R52 Pain, unspecified: Secondary | ICD-10-CM | POA: Diagnosis not present

## 2019-01-02 DIAGNOSIS — R42 Dizziness and giddiness: Secondary | ICD-10-CM | POA: Diagnosis not present

## 2019-01-02 DIAGNOSIS — K219 Gastro-esophageal reflux disease without esophagitis: Secondary | ICD-10-CM | POA: Diagnosis not present

## 2019-01-02 DIAGNOSIS — R630 Anorexia: Secondary | ICD-10-CM | POA: Diagnosis not present

## 2019-01-02 DIAGNOSIS — C50919 Malignant neoplasm of unspecified site of unspecified female breast: Secondary | ICD-10-CM | POA: Diagnosis not present

## 2019-01-03 DIAGNOSIS — R531 Weakness: Secondary | ICD-10-CM | POA: Diagnosis not present

## 2019-01-03 DIAGNOSIS — R52 Pain, unspecified: Secondary | ICD-10-CM | POA: Diagnosis not present

## 2019-01-03 DIAGNOSIS — R630 Anorexia: Secondary | ICD-10-CM | POA: Diagnosis not present

## 2019-01-03 DIAGNOSIS — C50919 Malignant neoplasm of unspecified site of unspecified female breast: Secondary | ICD-10-CM | POA: Diagnosis not present

## 2019-01-03 DIAGNOSIS — R42 Dizziness and giddiness: Secondary | ICD-10-CM | POA: Diagnosis not present

## 2019-01-03 DIAGNOSIS — K219 Gastro-esophageal reflux disease without esophagitis: Secondary | ICD-10-CM | POA: Diagnosis not present

## 2019-01-04 DIAGNOSIS — R52 Pain, unspecified: Secondary | ICD-10-CM | POA: Diagnosis not present

## 2019-01-04 DIAGNOSIS — R630 Anorexia: Secondary | ICD-10-CM | POA: Diagnosis not present

## 2019-01-04 DIAGNOSIS — R42 Dizziness and giddiness: Secondary | ICD-10-CM | POA: Diagnosis not present

## 2019-01-04 DIAGNOSIS — R531 Weakness: Secondary | ICD-10-CM | POA: Diagnosis not present

## 2019-01-04 DIAGNOSIS — K219 Gastro-esophageal reflux disease without esophagitis: Secondary | ICD-10-CM | POA: Diagnosis not present

## 2019-01-04 DIAGNOSIS — C50919 Malignant neoplasm of unspecified site of unspecified female breast: Secondary | ICD-10-CM | POA: Diagnosis not present

## 2019-01-05 DIAGNOSIS — R531 Weakness: Secondary | ICD-10-CM | POA: Diagnosis not present

## 2019-01-05 DIAGNOSIS — C50919 Malignant neoplasm of unspecified site of unspecified female breast: Secondary | ICD-10-CM | POA: Diagnosis not present

## 2019-01-05 DIAGNOSIS — R42 Dizziness and giddiness: Secondary | ICD-10-CM | POA: Diagnosis not present

## 2019-01-05 DIAGNOSIS — K219 Gastro-esophageal reflux disease without esophagitis: Secondary | ICD-10-CM | POA: Diagnosis not present

## 2019-01-05 DIAGNOSIS — R630 Anorexia: Secondary | ICD-10-CM | POA: Diagnosis not present

## 2019-01-05 DIAGNOSIS — R52 Pain, unspecified: Secondary | ICD-10-CM | POA: Diagnosis not present

## 2019-01-06 DIAGNOSIS — R42 Dizziness and giddiness: Secondary | ICD-10-CM | POA: Diagnosis not present

## 2019-01-06 DIAGNOSIS — R630 Anorexia: Secondary | ICD-10-CM | POA: Diagnosis not present

## 2019-01-06 DIAGNOSIS — K219 Gastro-esophageal reflux disease without esophagitis: Secondary | ICD-10-CM | POA: Diagnosis not present

## 2019-01-06 DIAGNOSIS — R531 Weakness: Secondary | ICD-10-CM | POA: Diagnosis not present

## 2019-01-06 DIAGNOSIS — C50919 Malignant neoplasm of unspecified site of unspecified female breast: Secondary | ICD-10-CM | POA: Diagnosis not present

## 2019-01-06 DIAGNOSIS — R52 Pain, unspecified: Secondary | ICD-10-CM | POA: Diagnosis not present

## 2019-01-07 DIAGNOSIS — C50919 Malignant neoplasm of unspecified site of unspecified female breast: Secondary | ICD-10-CM | POA: Diagnosis not present

## 2019-01-07 DIAGNOSIS — R531 Weakness: Secondary | ICD-10-CM | POA: Diagnosis not present

## 2019-01-07 DIAGNOSIS — R630 Anorexia: Secondary | ICD-10-CM | POA: Diagnosis not present

## 2019-01-07 DIAGNOSIS — R42 Dizziness and giddiness: Secondary | ICD-10-CM | POA: Diagnosis not present

## 2019-01-07 DIAGNOSIS — R52 Pain, unspecified: Secondary | ICD-10-CM | POA: Diagnosis not present

## 2019-01-07 DIAGNOSIS — K219 Gastro-esophageal reflux disease without esophagitis: Secondary | ICD-10-CM | POA: Diagnosis not present

## 2019-01-08 DIAGNOSIS — K219 Gastro-esophageal reflux disease without esophagitis: Secondary | ICD-10-CM | POA: Diagnosis not present

## 2019-01-08 DIAGNOSIS — C50919 Malignant neoplasm of unspecified site of unspecified female breast: Secondary | ICD-10-CM | POA: Diagnosis not present

## 2019-01-08 DIAGNOSIS — R531 Weakness: Secondary | ICD-10-CM | POA: Diagnosis not present

## 2019-01-08 DIAGNOSIS — R630 Anorexia: Secondary | ICD-10-CM | POA: Diagnosis not present

## 2019-01-08 DIAGNOSIS — R52 Pain, unspecified: Secondary | ICD-10-CM | POA: Diagnosis not present

## 2019-01-08 DIAGNOSIS — R42 Dizziness and giddiness: Secondary | ICD-10-CM | POA: Diagnosis not present

## 2019-01-09 DIAGNOSIS — R42 Dizziness and giddiness: Secondary | ICD-10-CM | POA: Diagnosis not present

## 2019-01-09 DIAGNOSIS — R630 Anorexia: Secondary | ICD-10-CM | POA: Diagnosis not present

## 2019-01-09 DIAGNOSIS — K219 Gastro-esophageal reflux disease without esophagitis: Secondary | ICD-10-CM | POA: Diagnosis not present

## 2019-01-09 DIAGNOSIS — C50919 Malignant neoplasm of unspecified site of unspecified female breast: Secondary | ICD-10-CM | POA: Diagnosis not present

## 2019-01-09 DIAGNOSIS — R52 Pain, unspecified: Secondary | ICD-10-CM | POA: Diagnosis not present

## 2019-01-09 DIAGNOSIS — R531 Weakness: Secondary | ICD-10-CM | POA: Diagnosis not present

## 2019-01-10 DIAGNOSIS — R682 Dry mouth, unspecified: Secondary | ICD-10-CM | POA: Diagnosis not present

## 2019-01-10 DIAGNOSIS — R52 Pain, unspecified: Secondary | ICD-10-CM | POA: Diagnosis not present

## 2019-01-10 DIAGNOSIS — Z66 Do not resuscitate: Secondary | ICD-10-CM | POA: Diagnosis not present

## 2019-01-10 DIAGNOSIS — R531 Weakness: Secondary | ICD-10-CM | POA: Diagnosis not present

## 2019-01-10 DIAGNOSIS — K219 Gastro-esophageal reflux disease without esophagitis: Secondary | ICD-10-CM | POA: Diagnosis not present

## 2019-01-10 DIAGNOSIS — L299 Pruritus, unspecified: Secondary | ICD-10-CM | POA: Diagnosis not present

## 2019-01-10 DIAGNOSIS — C50919 Malignant neoplasm of unspecified site of unspecified female breast: Secondary | ICD-10-CM | POA: Diagnosis not present

## 2019-01-10 DIAGNOSIS — I82B19 Acute embolism and thrombosis of unspecified subclavian vein: Secondary | ICD-10-CM | POA: Diagnosis not present

## 2019-01-10 DIAGNOSIS — R630 Anorexia: Secondary | ICD-10-CM | POA: Diagnosis not present

## 2019-01-10 DIAGNOSIS — R110 Nausea: Secondary | ICD-10-CM | POA: Diagnosis not present

## 2019-01-10 DIAGNOSIS — F419 Anxiety disorder, unspecified: Secondary | ICD-10-CM | POA: Diagnosis not present

## 2019-01-10 DIAGNOSIS — R42 Dizziness and giddiness: Secondary | ICD-10-CM | POA: Diagnosis not present

## 2019-01-11 DIAGNOSIS — R630 Anorexia: Secondary | ICD-10-CM | POA: Diagnosis not present

## 2019-01-11 DIAGNOSIS — K219 Gastro-esophageal reflux disease without esophagitis: Secondary | ICD-10-CM | POA: Diagnosis not present

## 2019-01-11 DIAGNOSIS — C50919 Malignant neoplasm of unspecified site of unspecified female breast: Secondary | ICD-10-CM | POA: Diagnosis not present

## 2019-01-11 DIAGNOSIS — R52 Pain, unspecified: Secondary | ICD-10-CM | POA: Diagnosis not present

## 2019-01-11 DIAGNOSIS — R531 Weakness: Secondary | ICD-10-CM | POA: Diagnosis not present

## 2019-01-11 DIAGNOSIS — R42 Dizziness and giddiness: Secondary | ICD-10-CM | POA: Diagnosis not present

## 2019-01-12 DIAGNOSIS — R42 Dizziness and giddiness: Secondary | ICD-10-CM | POA: Diagnosis not present

## 2019-01-12 DIAGNOSIS — R630 Anorexia: Secondary | ICD-10-CM | POA: Diagnosis not present

## 2019-01-12 DIAGNOSIS — R52 Pain, unspecified: Secondary | ICD-10-CM | POA: Diagnosis not present

## 2019-01-12 DIAGNOSIS — R531 Weakness: Secondary | ICD-10-CM | POA: Diagnosis not present

## 2019-01-12 DIAGNOSIS — C50919 Malignant neoplasm of unspecified site of unspecified female breast: Secondary | ICD-10-CM | POA: Diagnosis not present

## 2019-01-12 DIAGNOSIS — K219 Gastro-esophageal reflux disease without esophagitis: Secondary | ICD-10-CM | POA: Diagnosis not present

## 2019-01-13 DIAGNOSIS — R630 Anorexia: Secondary | ICD-10-CM | POA: Diagnosis not present

## 2019-01-13 DIAGNOSIS — R531 Weakness: Secondary | ICD-10-CM | POA: Diagnosis not present

## 2019-01-13 DIAGNOSIS — C50919 Malignant neoplasm of unspecified site of unspecified female breast: Secondary | ICD-10-CM | POA: Diagnosis not present

## 2019-01-13 DIAGNOSIS — R52 Pain, unspecified: Secondary | ICD-10-CM | POA: Diagnosis not present

## 2019-01-13 DIAGNOSIS — K219 Gastro-esophageal reflux disease without esophagitis: Secondary | ICD-10-CM | POA: Diagnosis not present

## 2019-01-13 DIAGNOSIS — R42 Dizziness and giddiness: Secondary | ICD-10-CM | POA: Diagnosis not present

## 2019-01-14 DIAGNOSIS — R630 Anorexia: Secondary | ICD-10-CM | POA: Diagnosis not present

## 2019-01-14 DIAGNOSIS — R42 Dizziness and giddiness: Secondary | ICD-10-CM | POA: Diagnosis not present

## 2019-01-14 DIAGNOSIS — R531 Weakness: Secondary | ICD-10-CM | POA: Diagnosis not present

## 2019-01-14 DIAGNOSIS — C50919 Malignant neoplasm of unspecified site of unspecified female breast: Secondary | ICD-10-CM | POA: Diagnosis not present

## 2019-01-14 DIAGNOSIS — R52 Pain, unspecified: Secondary | ICD-10-CM | POA: Diagnosis not present

## 2019-01-14 DIAGNOSIS — K219 Gastro-esophageal reflux disease without esophagitis: Secondary | ICD-10-CM | POA: Diagnosis not present

## 2019-01-15 DIAGNOSIS — K219 Gastro-esophageal reflux disease without esophagitis: Secondary | ICD-10-CM | POA: Diagnosis not present

## 2019-01-15 DIAGNOSIS — R52 Pain, unspecified: Secondary | ICD-10-CM | POA: Diagnosis not present

## 2019-01-15 DIAGNOSIS — R531 Weakness: Secondary | ICD-10-CM | POA: Diagnosis not present

## 2019-01-15 DIAGNOSIS — R42 Dizziness and giddiness: Secondary | ICD-10-CM | POA: Diagnosis not present

## 2019-01-15 DIAGNOSIS — R630 Anorexia: Secondary | ICD-10-CM | POA: Diagnosis not present

## 2019-01-15 DIAGNOSIS — C50919 Malignant neoplasm of unspecified site of unspecified female breast: Secondary | ICD-10-CM | POA: Diagnosis not present

## 2019-01-16 DIAGNOSIS — R630 Anorexia: Secondary | ICD-10-CM | POA: Diagnosis not present

## 2019-01-16 DIAGNOSIS — R52 Pain, unspecified: Secondary | ICD-10-CM | POA: Diagnosis not present

## 2019-01-16 DIAGNOSIS — C50919 Malignant neoplasm of unspecified site of unspecified female breast: Secondary | ICD-10-CM | POA: Diagnosis not present

## 2019-01-16 DIAGNOSIS — K219 Gastro-esophageal reflux disease without esophagitis: Secondary | ICD-10-CM | POA: Diagnosis not present

## 2019-01-16 DIAGNOSIS — R42 Dizziness and giddiness: Secondary | ICD-10-CM | POA: Diagnosis not present

## 2019-01-16 DIAGNOSIS — R531 Weakness: Secondary | ICD-10-CM | POA: Diagnosis not present

## 2019-01-17 DIAGNOSIS — R531 Weakness: Secondary | ICD-10-CM | POA: Diagnosis not present

## 2019-01-17 DIAGNOSIS — R52 Pain, unspecified: Secondary | ICD-10-CM | POA: Diagnosis not present

## 2019-01-17 DIAGNOSIS — R630 Anorexia: Secondary | ICD-10-CM | POA: Diagnosis not present

## 2019-01-17 DIAGNOSIS — R42 Dizziness and giddiness: Secondary | ICD-10-CM | POA: Diagnosis not present

## 2019-01-17 DIAGNOSIS — K219 Gastro-esophageal reflux disease without esophagitis: Secondary | ICD-10-CM | POA: Diagnosis not present

## 2019-01-17 DIAGNOSIS — C50919 Malignant neoplasm of unspecified site of unspecified female breast: Secondary | ICD-10-CM | POA: Diagnosis not present

## 2019-01-18 DIAGNOSIS — R531 Weakness: Secondary | ICD-10-CM | POA: Diagnosis not present

## 2019-01-18 DIAGNOSIS — C50919 Malignant neoplasm of unspecified site of unspecified female breast: Secondary | ICD-10-CM | POA: Diagnosis not present

## 2019-01-18 DIAGNOSIS — K219 Gastro-esophageal reflux disease without esophagitis: Secondary | ICD-10-CM | POA: Diagnosis not present

## 2019-01-18 DIAGNOSIS — R52 Pain, unspecified: Secondary | ICD-10-CM | POA: Diagnosis not present

## 2019-01-18 DIAGNOSIS — R630 Anorexia: Secondary | ICD-10-CM | POA: Diagnosis not present

## 2019-01-18 DIAGNOSIS — R42 Dizziness and giddiness: Secondary | ICD-10-CM | POA: Diagnosis not present

## 2019-01-19 DIAGNOSIS — K219 Gastro-esophageal reflux disease without esophagitis: Secondary | ICD-10-CM | POA: Diagnosis not present

## 2019-01-19 DIAGNOSIS — R630 Anorexia: Secondary | ICD-10-CM | POA: Diagnosis not present

## 2019-01-19 DIAGNOSIS — R52 Pain, unspecified: Secondary | ICD-10-CM | POA: Diagnosis not present

## 2019-01-19 DIAGNOSIS — R42 Dizziness and giddiness: Secondary | ICD-10-CM | POA: Diagnosis not present

## 2019-01-19 DIAGNOSIS — C50919 Malignant neoplasm of unspecified site of unspecified female breast: Secondary | ICD-10-CM | POA: Diagnosis not present

## 2019-01-19 DIAGNOSIS — R531 Weakness: Secondary | ICD-10-CM | POA: Diagnosis not present

## 2019-01-20 DIAGNOSIS — R531 Weakness: Secondary | ICD-10-CM | POA: Diagnosis not present

## 2019-01-20 DIAGNOSIS — R42 Dizziness and giddiness: Secondary | ICD-10-CM | POA: Diagnosis not present

## 2019-01-20 DIAGNOSIS — R52 Pain, unspecified: Secondary | ICD-10-CM | POA: Diagnosis not present

## 2019-01-20 DIAGNOSIS — C50919 Malignant neoplasm of unspecified site of unspecified female breast: Secondary | ICD-10-CM | POA: Diagnosis not present

## 2019-01-20 DIAGNOSIS — R630 Anorexia: Secondary | ICD-10-CM | POA: Diagnosis not present

## 2019-01-20 DIAGNOSIS — K219 Gastro-esophageal reflux disease without esophagitis: Secondary | ICD-10-CM | POA: Diagnosis not present

## 2019-01-21 DIAGNOSIS — R52 Pain, unspecified: Secondary | ICD-10-CM | POA: Diagnosis not present

## 2019-01-21 DIAGNOSIS — R42 Dizziness and giddiness: Secondary | ICD-10-CM | POA: Diagnosis not present

## 2019-01-21 DIAGNOSIS — C50919 Malignant neoplasm of unspecified site of unspecified female breast: Secondary | ICD-10-CM | POA: Diagnosis not present

## 2019-01-21 DIAGNOSIS — K219 Gastro-esophageal reflux disease without esophagitis: Secondary | ICD-10-CM | POA: Diagnosis not present

## 2019-01-21 DIAGNOSIS — R531 Weakness: Secondary | ICD-10-CM | POA: Diagnosis not present

## 2019-01-21 DIAGNOSIS — R630 Anorexia: Secondary | ICD-10-CM | POA: Diagnosis not present

## 2019-01-22 DIAGNOSIS — R630 Anorexia: Secondary | ICD-10-CM | POA: Diagnosis not present

## 2019-01-22 DIAGNOSIS — K219 Gastro-esophageal reflux disease without esophagitis: Secondary | ICD-10-CM | POA: Diagnosis not present

## 2019-01-22 DIAGNOSIS — R42 Dizziness and giddiness: Secondary | ICD-10-CM | POA: Diagnosis not present

## 2019-01-22 DIAGNOSIS — R531 Weakness: Secondary | ICD-10-CM | POA: Diagnosis not present

## 2019-01-22 DIAGNOSIS — R52 Pain, unspecified: Secondary | ICD-10-CM | POA: Diagnosis not present

## 2019-01-22 DIAGNOSIS — C50919 Malignant neoplasm of unspecified site of unspecified female breast: Secondary | ICD-10-CM | POA: Diagnosis not present

## 2019-01-23 DIAGNOSIS — R52 Pain, unspecified: Secondary | ICD-10-CM | POA: Diagnosis not present

## 2019-01-23 DIAGNOSIS — R531 Weakness: Secondary | ICD-10-CM | POA: Diagnosis not present

## 2019-01-23 DIAGNOSIS — C50919 Malignant neoplasm of unspecified site of unspecified female breast: Secondary | ICD-10-CM | POA: Diagnosis not present

## 2019-01-23 DIAGNOSIS — R630 Anorexia: Secondary | ICD-10-CM | POA: Diagnosis not present

## 2019-01-23 DIAGNOSIS — R42 Dizziness and giddiness: Secondary | ICD-10-CM | POA: Diagnosis not present

## 2019-01-23 DIAGNOSIS — K219 Gastro-esophageal reflux disease without esophagitis: Secondary | ICD-10-CM | POA: Diagnosis not present

## 2019-01-24 DIAGNOSIS — R42 Dizziness and giddiness: Secondary | ICD-10-CM | POA: Diagnosis not present

## 2019-01-24 DIAGNOSIS — R630 Anorexia: Secondary | ICD-10-CM | POA: Diagnosis not present

## 2019-01-24 DIAGNOSIS — C50919 Malignant neoplasm of unspecified site of unspecified female breast: Secondary | ICD-10-CM | POA: Diagnosis not present

## 2019-01-24 DIAGNOSIS — R52 Pain, unspecified: Secondary | ICD-10-CM | POA: Diagnosis not present

## 2019-01-24 DIAGNOSIS — R531 Weakness: Secondary | ICD-10-CM | POA: Diagnosis not present

## 2019-01-24 DIAGNOSIS — K219 Gastro-esophageal reflux disease without esophagitis: Secondary | ICD-10-CM | POA: Diagnosis not present

## 2019-01-25 DIAGNOSIS — R42 Dizziness and giddiness: Secondary | ICD-10-CM | POA: Diagnosis not present

## 2019-01-25 DIAGNOSIS — R531 Weakness: Secondary | ICD-10-CM | POA: Diagnosis not present

## 2019-01-25 DIAGNOSIS — R52 Pain, unspecified: Secondary | ICD-10-CM | POA: Diagnosis not present

## 2019-01-25 DIAGNOSIS — R630 Anorexia: Secondary | ICD-10-CM | POA: Diagnosis not present

## 2019-01-25 DIAGNOSIS — K219 Gastro-esophageal reflux disease without esophagitis: Secondary | ICD-10-CM | POA: Diagnosis not present

## 2019-01-25 DIAGNOSIS — C50919 Malignant neoplasm of unspecified site of unspecified female breast: Secondary | ICD-10-CM | POA: Diagnosis not present

## 2019-01-26 DIAGNOSIS — R531 Weakness: Secondary | ICD-10-CM | POA: Diagnosis not present

## 2019-01-26 DIAGNOSIS — C50919 Malignant neoplasm of unspecified site of unspecified female breast: Secondary | ICD-10-CM | POA: Diagnosis not present

## 2019-01-26 DIAGNOSIS — R630 Anorexia: Secondary | ICD-10-CM | POA: Diagnosis not present

## 2019-01-26 DIAGNOSIS — K219 Gastro-esophageal reflux disease without esophagitis: Secondary | ICD-10-CM | POA: Diagnosis not present

## 2019-01-26 DIAGNOSIS — R42 Dizziness and giddiness: Secondary | ICD-10-CM | POA: Diagnosis not present

## 2019-01-26 DIAGNOSIS — R52 Pain, unspecified: Secondary | ICD-10-CM | POA: Diagnosis not present

## 2019-01-27 DIAGNOSIS — R531 Weakness: Secondary | ICD-10-CM | POA: Diagnosis not present

## 2019-01-27 DIAGNOSIS — K219 Gastro-esophageal reflux disease without esophagitis: Secondary | ICD-10-CM | POA: Diagnosis not present

## 2019-01-27 DIAGNOSIS — R630 Anorexia: Secondary | ICD-10-CM | POA: Diagnosis not present

## 2019-01-27 DIAGNOSIS — R52 Pain, unspecified: Secondary | ICD-10-CM | POA: Diagnosis not present

## 2019-01-27 DIAGNOSIS — C50919 Malignant neoplasm of unspecified site of unspecified female breast: Secondary | ICD-10-CM | POA: Diagnosis not present

## 2019-01-27 DIAGNOSIS — R42 Dizziness and giddiness: Secondary | ICD-10-CM | POA: Diagnosis not present

## 2019-01-28 DIAGNOSIS — K219 Gastro-esophageal reflux disease without esophagitis: Secondary | ICD-10-CM | POA: Diagnosis not present

## 2019-01-28 DIAGNOSIS — R630 Anorexia: Secondary | ICD-10-CM | POA: Diagnosis not present

## 2019-01-28 DIAGNOSIS — C50919 Malignant neoplasm of unspecified site of unspecified female breast: Secondary | ICD-10-CM | POA: Diagnosis not present

## 2019-01-28 DIAGNOSIS — R42 Dizziness and giddiness: Secondary | ICD-10-CM | POA: Diagnosis not present

## 2019-01-28 DIAGNOSIS — R52 Pain, unspecified: Secondary | ICD-10-CM | POA: Diagnosis not present

## 2019-01-28 DIAGNOSIS — R531 Weakness: Secondary | ICD-10-CM | POA: Diagnosis not present

## 2019-01-29 DIAGNOSIS — K219 Gastro-esophageal reflux disease without esophagitis: Secondary | ICD-10-CM | POA: Diagnosis not present

## 2019-01-29 DIAGNOSIS — R531 Weakness: Secondary | ICD-10-CM | POA: Diagnosis not present

## 2019-01-29 DIAGNOSIS — R42 Dizziness and giddiness: Secondary | ICD-10-CM | POA: Diagnosis not present

## 2019-01-29 DIAGNOSIS — R52 Pain, unspecified: Secondary | ICD-10-CM | POA: Diagnosis not present

## 2019-01-29 DIAGNOSIS — C50919 Malignant neoplasm of unspecified site of unspecified female breast: Secondary | ICD-10-CM | POA: Diagnosis not present

## 2019-01-29 DIAGNOSIS — R630 Anorexia: Secondary | ICD-10-CM | POA: Diagnosis not present

## 2019-01-30 DIAGNOSIS — R42 Dizziness and giddiness: Secondary | ICD-10-CM | POA: Diagnosis not present

## 2019-01-30 DIAGNOSIS — C50919 Malignant neoplasm of unspecified site of unspecified female breast: Secondary | ICD-10-CM | POA: Diagnosis not present

## 2019-01-30 DIAGNOSIS — R630 Anorexia: Secondary | ICD-10-CM | POA: Diagnosis not present

## 2019-01-30 DIAGNOSIS — K219 Gastro-esophageal reflux disease without esophagitis: Secondary | ICD-10-CM | POA: Diagnosis not present

## 2019-01-30 DIAGNOSIS — R52 Pain, unspecified: Secondary | ICD-10-CM | POA: Diagnosis not present

## 2019-01-30 DIAGNOSIS — R531 Weakness: Secondary | ICD-10-CM | POA: Diagnosis not present

## 2019-01-31 DIAGNOSIS — R630 Anorexia: Secondary | ICD-10-CM | POA: Diagnosis not present

## 2019-01-31 DIAGNOSIS — R52 Pain, unspecified: Secondary | ICD-10-CM | POA: Diagnosis not present

## 2019-01-31 DIAGNOSIS — R42 Dizziness and giddiness: Secondary | ICD-10-CM | POA: Diagnosis not present

## 2019-01-31 DIAGNOSIS — C50919 Malignant neoplasm of unspecified site of unspecified female breast: Secondary | ICD-10-CM | POA: Diagnosis not present

## 2019-01-31 DIAGNOSIS — R531 Weakness: Secondary | ICD-10-CM | POA: Diagnosis not present

## 2019-01-31 DIAGNOSIS — K219 Gastro-esophageal reflux disease without esophagitis: Secondary | ICD-10-CM | POA: Diagnosis not present

## 2019-02-01 DIAGNOSIS — C50919 Malignant neoplasm of unspecified site of unspecified female breast: Secondary | ICD-10-CM | POA: Diagnosis not present

## 2019-02-01 DIAGNOSIS — R630 Anorexia: Secondary | ICD-10-CM | POA: Diagnosis not present

## 2019-02-01 DIAGNOSIS — R531 Weakness: Secondary | ICD-10-CM | POA: Diagnosis not present

## 2019-02-01 DIAGNOSIS — R42 Dizziness and giddiness: Secondary | ICD-10-CM | POA: Diagnosis not present

## 2019-02-01 DIAGNOSIS — K219 Gastro-esophageal reflux disease without esophagitis: Secondary | ICD-10-CM | POA: Diagnosis not present

## 2019-02-01 DIAGNOSIS — R52 Pain, unspecified: Secondary | ICD-10-CM | POA: Diagnosis not present

## 2019-02-02 DIAGNOSIS — R52 Pain, unspecified: Secondary | ICD-10-CM | POA: Diagnosis not present

## 2019-02-02 DIAGNOSIS — R42 Dizziness and giddiness: Secondary | ICD-10-CM | POA: Diagnosis not present

## 2019-02-02 DIAGNOSIS — C50919 Malignant neoplasm of unspecified site of unspecified female breast: Secondary | ICD-10-CM | POA: Diagnosis not present

## 2019-02-02 DIAGNOSIS — R531 Weakness: Secondary | ICD-10-CM | POA: Diagnosis not present

## 2019-02-02 DIAGNOSIS — K219 Gastro-esophageal reflux disease without esophagitis: Secondary | ICD-10-CM | POA: Diagnosis not present

## 2019-02-02 DIAGNOSIS — R630 Anorexia: Secondary | ICD-10-CM | POA: Diagnosis not present

## 2019-02-03 DIAGNOSIS — R531 Weakness: Secondary | ICD-10-CM | POA: Diagnosis not present

## 2019-02-03 DIAGNOSIS — K219 Gastro-esophageal reflux disease without esophagitis: Secondary | ICD-10-CM | POA: Diagnosis not present

## 2019-02-03 DIAGNOSIS — R630 Anorexia: Secondary | ICD-10-CM | POA: Diagnosis not present

## 2019-02-03 DIAGNOSIS — R42 Dizziness and giddiness: Secondary | ICD-10-CM | POA: Diagnosis not present

## 2019-02-03 DIAGNOSIS — R52 Pain, unspecified: Secondary | ICD-10-CM | POA: Diagnosis not present

## 2019-02-03 DIAGNOSIS — C50919 Malignant neoplasm of unspecified site of unspecified female breast: Secondary | ICD-10-CM | POA: Diagnosis not present

## 2019-02-04 DIAGNOSIS — R531 Weakness: Secondary | ICD-10-CM | POA: Diagnosis not present

## 2019-02-04 DIAGNOSIS — R52 Pain, unspecified: Secondary | ICD-10-CM | POA: Diagnosis not present

## 2019-02-04 DIAGNOSIS — R42 Dizziness and giddiness: Secondary | ICD-10-CM | POA: Diagnosis not present

## 2019-02-04 DIAGNOSIS — K219 Gastro-esophageal reflux disease without esophagitis: Secondary | ICD-10-CM | POA: Diagnosis not present

## 2019-02-04 DIAGNOSIS — R630 Anorexia: Secondary | ICD-10-CM | POA: Diagnosis not present

## 2019-02-04 DIAGNOSIS — C50919 Malignant neoplasm of unspecified site of unspecified female breast: Secondary | ICD-10-CM | POA: Diagnosis not present

## 2019-02-05 DIAGNOSIS — R630 Anorexia: Secondary | ICD-10-CM | POA: Diagnosis not present

## 2019-02-05 DIAGNOSIS — R42 Dizziness and giddiness: Secondary | ICD-10-CM | POA: Diagnosis not present

## 2019-02-05 DIAGNOSIS — R52 Pain, unspecified: Secondary | ICD-10-CM | POA: Diagnosis not present

## 2019-02-05 DIAGNOSIS — K219 Gastro-esophageal reflux disease without esophagitis: Secondary | ICD-10-CM | POA: Diagnosis not present

## 2019-02-05 DIAGNOSIS — R531 Weakness: Secondary | ICD-10-CM | POA: Diagnosis not present

## 2019-02-05 DIAGNOSIS — C50919 Malignant neoplasm of unspecified site of unspecified female breast: Secondary | ICD-10-CM | POA: Diagnosis not present

## 2019-02-06 DIAGNOSIS — R531 Weakness: Secondary | ICD-10-CM | POA: Diagnosis not present

## 2019-02-06 DIAGNOSIS — R630 Anorexia: Secondary | ICD-10-CM | POA: Diagnosis not present

## 2019-02-06 DIAGNOSIS — K219 Gastro-esophageal reflux disease without esophagitis: Secondary | ICD-10-CM | POA: Diagnosis not present

## 2019-02-06 DIAGNOSIS — R52 Pain, unspecified: Secondary | ICD-10-CM | POA: Diagnosis not present

## 2019-02-06 DIAGNOSIS — R42 Dizziness and giddiness: Secondary | ICD-10-CM | POA: Diagnosis not present

## 2019-02-06 DIAGNOSIS — C50919 Malignant neoplasm of unspecified site of unspecified female breast: Secondary | ICD-10-CM | POA: Diagnosis not present

## 2019-02-07 DIAGNOSIS — C50919 Malignant neoplasm of unspecified site of unspecified female breast: Secondary | ICD-10-CM | POA: Diagnosis not present

## 2019-02-07 DIAGNOSIS — R531 Weakness: Secondary | ICD-10-CM | POA: Diagnosis not present

## 2019-02-07 DIAGNOSIS — K219 Gastro-esophageal reflux disease without esophagitis: Secondary | ICD-10-CM | POA: Diagnosis not present

## 2019-02-07 DIAGNOSIS — R630 Anorexia: Secondary | ICD-10-CM | POA: Diagnosis not present

## 2019-02-07 DIAGNOSIS — R52 Pain, unspecified: Secondary | ICD-10-CM | POA: Diagnosis not present

## 2019-02-07 DIAGNOSIS — R42 Dizziness and giddiness: Secondary | ICD-10-CM | POA: Diagnosis not present

## 2019-02-08 DIAGNOSIS — R630 Anorexia: Secondary | ICD-10-CM | POA: Diagnosis not present

## 2019-02-08 DIAGNOSIS — R42 Dizziness and giddiness: Secondary | ICD-10-CM | POA: Diagnosis not present

## 2019-02-08 DIAGNOSIS — K219 Gastro-esophageal reflux disease without esophagitis: Secondary | ICD-10-CM | POA: Diagnosis not present

## 2019-02-08 DIAGNOSIS — R52 Pain, unspecified: Secondary | ICD-10-CM | POA: Diagnosis not present

## 2019-02-08 DIAGNOSIS — C50919 Malignant neoplasm of unspecified site of unspecified female breast: Secondary | ICD-10-CM | POA: Diagnosis not present

## 2019-02-08 DIAGNOSIS — R531 Weakness: Secondary | ICD-10-CM | POA: Diagnosis not present

## 2019-02-09 DIAGNOSIS — C50919 Malignant neoplasm of unspecified site of unspecified female breast: Secondary | ICD-10-CM | POA: Diagnosis not present

## 2019-02-09 DIAGNOSIS — F419 Anxiety disorder, unspecified: Secondary | ICD-10-CM | POA: Diagnosis not present

## 2019-02-09 DIAGNOSIS — I82B19 Acute embolism and thrombosis of unspecified subclavian vein: Secondary | ICD-10-CM | POA: Diagnosis not present

## 2019-02-09 DIAGNOSIS — R42 Dizziness and giddiness: Secondary | ICD-10-CM | POA: Diagnosis not present

## 2019-02-09 DIAGNOSIS — R682 Dry mouth, unspecified: Secondary | ICD-10-CM | POA: Diagnosis not present

## 2019-02-09 DIAGNOSIS — R630 Anorexia: Secondary | ICD-10-CM | POA: Diagnosis not present

## 2019-02-09 DIAGNOSIS — K219 Gastro-esophageal reflux disease without esophagitis: Secondary | ICD-10-CM | POA: Diagnosis not present

## 2019-02-09 DIAGNOSIS — R110 Nausea: Secondary | ICD-10-CM | POA: Diagnosis not present

## 2019-02-09 DIAGNOSIS — Z66 Do not resuscitate: Secondary | ICD-10-CM | POA: Diagnosis not present

## 2019-02-09 DIAGNOSIS — L299 Pruritus, unspecified: Secondary | ICD-10-CM | POA: Diagnosis not present

## 2019-02-09 DIAGNOSIS — R531 Weakness: Secondary | ICD-10-CM | POA: Diagnosis not present

## 2019-02-09 DIAGNOSIS — R52 Pain, unspecified: Secondary | ICD-10-CM | POA: Diagnosis not present

## 2019-02-10 DIAGNOSIS — R42 Dizziness and giddiness: Secondary | ICD-10-CM | POA: Diagnosis not present

## 2019-02-10 DIAGNOSIS — C50919 Malignant neoplasm of unspecified site of unspecified female breast: Secondary | ICD-10-CM | POA: Diagnosis not present

## 2019-02-10 DIAGNOSIS — K219 Gastro-esophageal reflux disease without esophagitis: Secondary | ICD-10-CM | POA: Diagnosis not present

## 2019-02-10 DIAGNOSIS — R630 Anorexia: Secondary | ICD-10-CM | POA: Diagnosis not present

## 2019-02-10 DIAGNOSIS — R531 Weakness: Secondary | ICD-10-CM | POA: Diagnosis not present

## 2019-02-10 DIAGNOSIS — R52 Pain, unspecified: Secondary | ICD-10-CM | POA: Diagnosis not present

## 2019-02-11 DIAGNOSIS — K219 Gastro-esophageal reflux disease without esophagitis: Secondary | ICD-10-CM | POA: Diagnosis not present

## 2019-02-11 DIAGNOSIS — R531 Weakness: Secondary | ICD-10-CM | POA: Diagnosis not present

## 2019-02-11 DIAGNOSIS — R42 Dizziness and giddiness: Secondary | ICD-10-CM | POA: Diagnosis not present

## 2019-02-11 DIAGNOSIS — R630 Anorexia: Secondary | ICD-10-CM | POA: Diagnosis not present

## 2019-02-11 DIAGNOSIS — C50919 Malignant neoplasm of unspecified site of unspecified female breast: Secondary | ICD-10-CM | POA: Diagnosis not present

## 2019-02-11 DIAGNOSIS — R52 Pain, unspecified: Secondary | ICD-10-CM | POA: Diagnosis not present

## 2019-02-12 DIAGNOSIS — R531 Weakness: Secondary | ICD-10-CM | POA: Diagnosis not present

## 2019-02-12 DIAGNOSIS — R42 Dizziness and giddiness: Secondary | ICD-10-CM | POA: Diagnosis not present

## 2019-02-12 DIAGNOSIS — R630 Anorexia: Secondary | ICD-10-CM | POA: Diagnosis not present

## 2019-02-12 DIAGNOSIS — R52 Pain, unspecified: Secondary | ICD-10-CM | POA: Diagnosis not present

## 2019-02-12 DIAGNOSIS — C50919 Malignant neoplasm of unspecified site of unspecified female breast: Secondary | ICD-10-CM | POA: Diagnosis not present

## 2019-02-12 DIAGNOSIS — K219 Gastro-esophageal reflux disease without esophagitis: Secondary | ICD-10-CM | POA: Diagnosis not present

## 2019-02-13 DIAGNOSIS — R630 Anorexia: Secondary | ICD-10-CM | POA: Diagnosis not present

## 2019-02-13 DIAGNOSIS — K219 Gastro-esophageal reflux disease without esophagitis: Secondary | ICD-10-CM | POA: Diagnosis not present

## 2019-02-13 DIAGNOSIS — R52 Pain, unspecified: Secondary | ICD-10-CM | POA: Diagnosis not present

## 2019-02-13 DIAGNOSIS — C50919 Malignant neoplasm of unspecified site of unspecified female breast: Secondary | ICD-10-CM | POA: Diagnosis not present

## 2019-02-13 DIAGNOSIS — R531 Weakness: Secondary | ICD-10-CM | POA: Diagnosis not present

## 2019-02-13 DIAGNOSIS — R42 Dizziness and giddiness: Secondary | ICD-10-CM | POA: Diagnosis not present

## 2019-02-14 DIAGNOSIS — C50919 Malignant neoplasm of unspecified site of unspecified female breast: Secondary | ICD-10-CM | POA: Diagnosis not present

## 2019-02-14 DIAGNOSIS — R52 Pain, unspecified: Secondary | ICD-10-CM | POA: Diagnosis not present

## 2019-02-14 DIAGNOSIS — R531 Weakness: Secondary | ICD-10-CM | POA: Diagnosis not present

## 2019-02-14 DIAGNOSIS — R630 Anorexia: Secondary | ICD-10-CM | POA: Diagnosis not present

## 2019-02-14 DIAGNOSIS — R42 Dizziness and giddiness: Secondary | ICD-10-CM | POA: Diagnosis not present

## 2019-02-14 DIAGNOSIS — K219 Gastro-esophageal reflux disease without esophagitis: Secondary | ICD-10-CM | POA: Diagnosis not present

## 2019-02-15 DIAGNOSIS — R531 Weakness: Secondary | ICD-10-CM | POA: Diagnosis not present

## 2019-02-15 DIAGNOSIS — R42 Dizziness and giddiness: Secondary | ICD-10-CM | POA: Diagnosis not present

## 2019-02-15 DIAGNOSIS — K219 Gastro-esophageal reflux disease without esophagitis: Secondary | ICD-10-CM | POA: Diagnosis not present

## 2019-02-15 DIAGNOSIS — R630 Anorexia: Secondary | ICD-10-CM | POA: Diagnosis not present

## 2019-02-15 DIAGNOSIS — C50919 Malignant neoplasm of unspecified site of unspecified female breast: Secondary | ICD-10-CM | POA: Diagnosis not present

## 2019-02-15 DIAGNOSIS — R52 Pain, unspecified: Secondary | ICD-10-CM | POA: Diagnosis not present

## 2019-02-16 DIAGNOSIS — K219 Gastro-esophageal reflux disease without esophagitis: Secondary | ICD-10-CM | POA: Diagnosis not present

## 2019-02-16 DIAGNOSIS — R42 Dizziness and giddiness: Secondary | ICD-10-CM | POA: Diagnosis not present

## 2019-02-16 DIAGNOSIS — R630 Anorexia: Secondary | ICD-10-CM | POA: Diagnosis not present

## 2019-02-16 DIAGNOSIS — R52 Pain, unspecified: Secondary | ICD-10-CM | POA: Diagnosis not present

## 2019-02-16 DIAGNOSIS — C50919 Malignant neoplasm of unspecified site of unspecified female breast: Secondary | ICD-10-CM | POA: Diagnosis not present

## 2019-02-16 DIAGNOSIS — R531 Weakness: Secondary | ICD-10-CM | POA: Diagnosis not present

## 2019-02-17 DIAGNOSIS — R531 Weakness: Secondary | ICD-10-CM | POA: Diagnosis not present

## 2019-02-17 DIAGNOSIS — R630 Anorexia: Secondary | ICD-10-CM | POA: Diagnosis not present

## 2019-02-17 DIAGNOSIS — R52 Pain, unspecified: Secondary | ICD-10-CM | POA: Diagnosis not present

## 2019-02-17 DIAGNOSIS — C50919 Malignant neoplasm of unspecified site of unspecified female breast: Secondary | ICD-10-CM | POA: Diagnosis not present

## 2019-02-17 DIAGNOSIS — K219 Gastro-esophageal reflux disease without esophagitis: Secondary | ICD-10-CM | POA: Diagnosis not present

## 2019-02-17 DIAGNOSIS — R42 Dizziness and giddiness: Secondary | ICD-10-CM | POA: Diagnosis not present

## 2019-02-18 DIAGNOSIS — K219 Gastro-esophageal reflux disease without esophagitis: Secondary | ICD-10-CM | POA: Diagnosis not present

## 2019-02-18 DIAGNOSIS — C50919 Malignant neoplasm of unspecified site of unspecified female breast: Secondary | ICD-10-CM | POA: Diagnosis not present

## 2019-02-18 DIAGNOSIS — R630 Anorexia: Secondary | ICD-10-CM | POA: Diagnosis not present

## 2019-02-18 DIAGNOSIS — R42 Dizziness and giddiness: Secondary | ICD-10-CM | POA: Diagnosis not present

## 2019-02-18 DIAGNOSIS — R52 Pain, unspecified: Secondary | ICD-10-CM | POA: Diagnosis not present

## 2019-02-18 DIAGNOSIS — R531 Weakness: Secondary | ICD-10-CM | POA: Diagnosis not present

## 2019-02-19 DIAGNOSIS — R42 Dizziness and giddiness: Secondary | ICD-10-CM | POA: Diagnosis not present

## 2019-02-19 DIAGNOSIS — R52 Pain, unspecified: Secondary | ICD-10-CM | POA: Diagnosis not present

## 2019-02-19 DIAGNOSIS — R531 Weakness: Secondary | ICD-10-CM | POA: Diagnosis not present

## 2019-02-19 DIAGNOSIS — K219 Gastro-esophageal reflux disease without esophagitis: Secondary | ICD-10-CM | POA: Diagnosis not present

## 2019-02-19 DIAGNOSIS — R630 Anorexia: Secondary | ICD-10-CM | POA: Diagnosis not present

## 2019-02-19 DIAGNOSIS — C50919 Malignant neoplasm of unspecified site of unspecified female breast: Secondary | ICD-10-CM | POA: Diagnosis not present

## 2019-02-20 DIAGNOSIS — R531 Weakness: Secondary | ICD-10-CM | POA: Diagnosis not present

## 2019-02-20 DIAGNOSIS — C50919 Malignant neoplasm of unspecified site of unspecified female breast: Secondary | ICD-10-CM | POA: Diagnosis not present

## 2019-02-20 DIAGNOSIS — R52 Pain, unspecified: Secondary | ICD-10-CM | POA: Diagnosis not present

## 2019-02-20 DIAGNOSIS — R630 Anorexia: Secondary | ICD-10-CM | POA: Diagnosis not present

## 2019-02-20 DIAGNOSIS — R42 Dizziness and giddiness: Secondary | ICD-10-CM | POA: Diagnosis not present

## 2019-02-20 DIAGNOSIS — K219 Gastro-esophageal reflux disease without esophagitis: Secondary | ICD-10-CM | POA: Diagnosis not present

## 2019-02-21 DIAGNOSIS — R42 Dizziness and giddiness: Secondary | ICD-10-CM | POA: Diagnosis not present

## 2019-02-21 DIAGNOSIS — C50919 Malignant neoplasm of unspecified site of unspecified female breast: Secondary | ICD-10-CM | POA: Diagnosis not present

## 2019-02-21 DIAGNOSIS — R531 Weakness: Secondary | ICD-10-CM | POA: Diagnosis not present

## 2019-02-21 DIAGNOSIS — R52 Pain, unspecified: Secondary | ICD-10-CM | POA: Diagnosis not present

## 2019-02-21 DIAGNOSIS — K219 Gastro-esophageal reflux disease without esophagitis: Secondary | ICD-10-CM | POA: Diagnosis not present

## 2019-02-21 DIAGNOSIS — R630 Anorexia: Secondary | ICD-10-CM | POA: Diagnosis not present

## 2019-02-22 DIAGNOSIS — K219 Gastro-esophageal reflux disease without esophagitis: Secondary | ICD-10-CM | POA: Diagnosis not present

## 2019-02-22 DIAGNOSIS — R630 Anorexia: Secondary | ICD-10-CM | POA: Diagnosis not present

## 2019-02-22 DIAGNOSIS — R52 Pain, unspecified: Secondary | ICD-10-CM | POA: Diagnosis not present

## 2019-02-22 DIAGNOSIS — R42 Dizziness and giddiness: Secondary | ICD-10-CM | POA: Diagnosis not present

## 2019-02-22 DIAGNOSIS — C50919 Malignant neoplasm of unspecified site of unspecified female breast: Secondary | ICD-10-CM | POA: Diagnosis not present

## 2019-02-22 DIAGNOSIS — R531 Weakness: Secondary | ICD-10-CM | POA: Diagnosis not present

## 2019-02-23 DIAGNOSIS — R531 Weakness: Secondary | ICD-10-CM | POA: Diagnosis not present

## 2019-02-23 DIAGNOSIS — R630 Anorexia: Secondary | ICD-10-CM | POA: Diagnosis not present

## 2019-02-23 DIAGNOSIS — C50919 Malignant neoplasm of unspecified site of unspecified female breast: Secondary | ICD-10-CM | POA: Diagnosis not present

## 2019-02-23 DIAGNOSIS — K219 Gastro-esophageal reflux disease without esophagitis: Secondary | ICD-10-CM | POA: Diagnosis not present

## 2019-02-23 DIAGNOSIS — R42 Dizziness and giddiness: Secondary | ICD-10-CM | POA: Diagnosis not present

## 2019-02-23 DIAGNOSIS — R52 Pain, unspecified: Secondary | ICD-10-CM | POA: Diagnosis not present

## 2019-02-24 DIAGNOSIS — R531 Weakness: Secondary | ICD-10-CM | POA: Diagnosis not present

## 2019-02-24 DIAGNOSIS — R52 Pain, unspecified: Secondary | ICD-10-CM | POA: Diagnosis not present

## 2019-02-24 DIAGNOSIS — C50919 Malignant neoplasm of unspecified site of unspecified female breast: Secondary | ICD-10-CM | POA: Diagnosis not present

## 2019-02-24 DIAGNOSIS — R630 Anorexia: Secondary | ICD-10-CM | POA: Diagnosis not present

## 2019-02-24 DIAGNOSIS — R42 Dizziness and giddiness: Secondary | ICD-10-CM | POA: Diagnosis not present

## 2019-02-24 DIAGNOSIS — K219 Gastro-esophageal reflux disease without esophagitis: Secondary | ICD-10-CM | POA: Diagnosis not present

## 2019-02-25 DIAGNOSIS — C50919 Malignant neoplasm of unspecified site of unspecified female breast: Secondary | ICD-10-CM | POA: Diagnosis not present

## 2019-02-25 DIAGNOSIS — R52 Pain, unspecified: Secondary | ICD-10-CM | POA: Diagnosis not present

## 2019-02-25 DIAGNOSIS — R630 Anorexia: Secondary | ICD-10-CM | POA: Diagnosis not present

## 2019-02-25 DIAGNOSIS — R531 Weakness: Secondary | ICD-10-CM | POA: Diagnosis not present

## 2019-02-25 DIAGNOSIS — R42 Dizziness and giddiness: Secondary | ICD-10-CM | POA: Diagnosis not present

## 2019-02-25 DIAGNOSIS — K219 Gastro-esophageal reflux disease without esophagitis: Secondary | ICD-10-CM | POA: Diagnosis not present

## 2019-02-26 DIAGNOSIS — R52 Pain, unspecified: Secondary | ICD-10-CM | POA: Diagnosis not present

## 2019-02-26 DIAGNOSIS — C50919 Malignant neoplasm of unspecified site of unspecified female breast: Secondary | ICD-10-CM | POA: Diagnosis not present

## 2019-02-26 DIAGNOSIS — R531 Weakness: Secondary | ICD-10-CM | POA: Diagnosis not present

## 2019-02-26 DIAGNOSIS — K219 Gastro-esophageal reflux disease without esophagitis: Secondary | ICD-10-CM | POA: Diagnosis not present

## 2019-02-26 DIAGNOSIS — R630 Anorexia: Secondary | ICD-10-CM | POA: Diagnosis not present

## 2019-02-26 DIAGNOSIS — R42 Dizziness and giddiness: Secondary | ICD-10-CM | POA: Diagnosis not present

## 2019-02-27 DIAGNOSIS — R531 Weakness: Secondary | ICD-10-CM | POA: Diagnosis not present

## 2019-02-27 DIAGNOSIS — K219 Gastro-esophageal reflux disease without esophagitis: Secondary | ICD-10-CM | POA: Diagnosis not present

## 2019-02-27 DIAGNOSIS — C50919 Malignant neoplasm of unspecified site of unspecified female breast: Secondary | ICD-10-CM | POA: Diagnosis not present

## 2019-02-27 DIAGNOSIS — R42 Dizziness and giddiness: Secondary | ICD-10-CM | POA: Diagnosis not present

## 2019-02-27 DIAGNOSIS — R52 Pain, unspecified: Secondary | ICD-10-CM | POA: Diagnosis not present

## 2019-02-27 DIAGNOSIS — R630 Anorexia: Secondary | ICD-10-CM | POA: Diagnosis not present

## 2019-02-28 DIAGNOSIS — R42 Dizziness and giddiness: Secondary | ICD-10-CM | POA: Diagnosis not present

## 2019-02-28 DIAGNOSIS — R531 Weakness: Secondary | ICD-10-CM | POA: Diagnosis not present

## 2019-02-28 DIAGNOSIS — C50919 Malignant neoplasm of unspecified site of unspecified female breast: Secondary | ICD-10-CM | POA: Diagnosis not present

## 2019-02-28 DIAGNOSIS — R630 Anorexia: Secondary | ICD-10-CM | POA: Diagnosis not present

## 2019-02-28 DIAGNOSIS — K219 Gastro-esophageal reflux disease without esophagitis: Secondary | ICD-10-CM | POA: Diagnosis not present

## 2019-02-28 DIAGNOSIS — R52 Pain, unspecified: Secondary | ICD-10-CM | POA: Diagnosis not present

## 2019-03-12 DEATH — deceased

## 2021-08-10 ENCOUNTER — Encounter (HOSPITAL_COMMUNITY): Payer: Self-pay | Admitting: Internal Medicine

## 2021-10-11 ENCOUNTER — Other Ambulatory Visit: Payer: Self-pay | Admitting: Nurse Practitioner
